# Patient Record
Sex: Female | Born: 1955 | Race: White | Hispanic: No | Marital: Married | Smoking: Current every day smoker
Health system: Southern US, Community
[De-identification: ages and names within clinical notes are randomized; demographics above are authoritative.]

## PROBLEM LIST (undated history)

## (undated) DIAGNOSIS — K2970 Gastritis, unspecified, without bleeding: Secondary | ICD-10-CM

## (undated) DIAGNOSIS — Z9049 Acquired absence of other specified parts of digestive tract: Secondary | ICD-10-CM

## (undated) DIAGNOSIS — F32A Depression, unspecified: Secondary | ICD-10-CM

## (undated) DIAGNOSIS — B9681 Helicobacter pylori [H. pylori] as the cause of diseases classified elsewhere: Secondary | ICD-10-CM

## (undated) DIAGNOSIS — K52831 Collagenous colitis: Secondary | ICD-10-CM

## (undated) DIAGNOSIS — Z85118 Personal history of other malignant neoplasm of bronchus and lung: Secondary | ICD-10-CM

## (undated) DIAGNOSIS — E039 Hypothyroidism, unspecified: Secondary | ICD-10-CM

## (undated) DIAGNOSIS — G8384 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postepileptic): Secondary | ICD-10-CM

## (undated) DIAGNOSIS — C801 Malignant (primary) neoplasm, unspecified: Secondary | ICD-10-CM

## (undated) DIAGNOSIS — J449 Chronic obstructive pulmonary disease, unspecified: Secondary | ICD-10-CM

## (undated) DIAGNOSIS — K219 Gastro-esophageal reflux disease without esophagitis: Secondary | ICD-10-CM

## (undated) DIAGNOSIS — I214 Non-ST elevation (NSTEMI) myocardial infarction: Secondary | ICD-10-CM

## (undated) DIAGNOSIS — M545 Low back pain, unspecified: Secondary | ICD-10-CM

## (undated) DIAGNOSIS — F329 Major depressive disorder, single episode, unspecified: Secondary | ICD-10-CM

## (undated) DIAGNOSIS — E785 Hyperlipidemia, unspecified: Secondary | ICD-10-CM

## (undated) DIAGNOSIS — M179 Osteoarthritis of knee, unspecified: Secondary | ICD-10-CM

## (undated) DIAGNOSIS — F419 Anxiety disorder, unspecified: Secondary | ICD-10-CM

## (undated) DIAGNOSIS — I352 Nonrheumatic aortic (valve) stenosis with insufficiency: Secondary | ICD-10-CM

## (undated) DIAGNOSIS — I639 Cerebral infarction, unspecified: Secondary | ICD-10-CM

## (undated) DIAGNOSIS — I2510 Atherosclerotic heart disease of native coronary artery without angina pectoris: Secondary | ICD-10-CM

## (undated) DIAGNOSIS — D126 Benign neoplasm of colon, unspecified: Secondary | ICD-10-CM

## (undated) DIAGNOSIS — K2210 Ulcer of esophagus without bleeding: Secondary | ICD-10-CM

## (undated) DIAGNOSIS — Z90710 Acquired absence of both cervix and uterus: Secondary | ICD-10-CM

## (undated) DIAGNOSIS — Z9861 Coronary angioplasty status: Secondary | ICD-10-CM

## (undated) DIAGNOSIS — R569 Unspecified convulsions: Secondary | ICD-10-CM

## (undated) DIAGNOSIS — M1710 Unilateral primary osteoarthritis, unspecified knee: Secondary | ICD-10-CM

## (undated) DIAGNOSIS — K5790 Diverticulosis of intestine, part unspecified, without perforation or abscess without bleeding: Secondary | ICD-10-CM

## (undated) DIAGNOSIS — K222 Esophageal obstruction: Secondary | ICD-10-CM

## (undated) HISTORY — DX: Non-ST elevation (NSTEMI) myocardial infarction: I21.4

## (undated) HISTORY — DX: Osteoarthritis of knee, unspecified: M17.9

## (undated) HISTORY — DX: Major depressive disorder, single episode, unspecified: F32.9

## (undated) HISTORY — DX: Anxiety disorder, unspecified: F41.9

## (undated) HISTORY — DX: Hypothyroidism, unspecified: E03.9

## (undated) HISTORY — DX: Benign neoplasm of colon, unspecified: D12.6

## (undated) HISTORY — PX: ESOPHAGOGASTRODUODENOSCOPY: SHX1529

## (undated) HISTORY — DX: Ulcer of esophagus without bleeding: K22.10

## (undated) HISTORY — DX: Low back pain, unspecified: M54.50

## (undated) HISTORY — DX: Chronic obstructive pulmonary disease, unspecified: J44.9

## (undated) HISTORY — DX: Unspecified convulsions: R56.9

## (undated) HISTORY — DX: Atherosclerotic heart disease of native coronary artery without angina pectoris: I25.10

## (undated) HISTORY — PX: TOTAL HIP ARTHROPLASTY: SHX124

## (undated) HISTORY — DX: Unilateral primary osteoarthritis, unspecified knee: M17.10

## (undated) HISTORY — DX: Collagenous colitis: K52.831

## (undated) HISTORY — DX: Diverticulosis of intestine, part unspecified, without perforation or abscess without bleeding: K57.90

## (undated) HISTORY — PX: ABDOMINAL SURGERY: SHX537

## (undated) HISTORY — PX: CHOLECYSTECTOMY: SHX55

## (undated) HISTORY — DX: Coronary angioplasty status: Z98.61

## (undated) HISTORY — DX: Gastro-esophageal reflux disease without esophagitis: K21.9

## (undated) HISTORY — DX: Gastritis, unspecified, without bleeding: K29.70

## (undated) HISTORY — DX: Low back pain: M54.5

## (undated) HISTORY — DX: Cerebral infarction, unspecified: I63.9

## (undated) HISTORY — DX: Nonrheumatic aortic (valve) stenosis with insufficiency: I35.2

## (undated) HISTORY — DX: Helicobacter pylori (H. pylori) as the cause of diseases classified elsewhere: B96.81

## (undated) HISTORY — PX: APPENDECTOMY: SHX54

## (undated) HISTORY — PX: COLONOSCOPY W/ BIOPSIES: SHX1374

## (undated) HISTORY — DX: Hyperlipidemia, unspecified: E78.5

## (undated) HISTORY — DX: Depression, unspecified: F32.A

## (undated) HISTORY — PX: ABDOMINAL HYSTERECTOMY: SHX81

---

## 1997-06-26 DIAGNOSIS — Z85118 Personal history of other malignant neoplasm of bronchus and lung: Secondary | ICD-10-CM

## 1997-06-26 HISTORY — DX: Personal history of other malignant neoplasm of bronchus and lung: Z85.118

## 1997-10-30 ENCOUNTER — Other Ambulatory Visit: Admission: RE | Admit: 1997-10-30 | Discharge: 1997-10-30 | Payer: Self-pay | Admitting: Hematology and Oncology

## 1997-11-12 ENCOUNTER — Ambulatory Visit (HOSPITAL_COMMUNITY): Admission: RE | Admit: 1997-11-12 | Discharge: 1997-11-12 | Payer: Self-pay | Admitting: Hematology and Oncology

## 1997-12-11 ENCOUNTER — Inpatient Hospital Stay (HOSPITAL_COMMUNITY): Admission: AD | Admit: 1997-12-11 | Discharge: 1997-12-16 | Payer: Self-pay | Admitting: Internal Medicine

## 1998-01-18 ENCOUNTER — Ambulatory Visit (HOSPITAL_COMMUNITY): Admission: RE | Admit: 1998-01-18 | Discharge: 1998-01-18 | Payer: Self-pay | Admitting: Hematology and Oncology

## 1998-01-20 ENCOUNTER — Other Ambulatory Visit: Admission: RE | Admit: 1998-01-20 | Discharge: 1998-01-20 | Payer: Self-pay | Admitting: Hematology and Oncology

## 1998-02-01 ENCOUNTER — Ambulatory Visit (HOSPITAL_COMMUNITY): Admission: RE | Admit: 1998-02-01 | Discharge: 1998-02-01 | Payer: Self-pay | Admitting: Internal Medicine

## 1998-02-02 ENCOUNTER — Inpatient Hospital Stay (HOSPITAL_COMMUNITY): Admission: EM | Admit: 1998-02-02 | Discharge: 1998-02-04 | Payer: Self-pay | Admitting: Internal Medicine

## 1998-04-01 ENCOUNTER — Emergency Department (HOSPITAL_COMMUNITY): Admission: EM | Admit: 1998-04-01 | Discharge: 1998-04-01 | Payer: Self-pay | Admitting: Emergency Medicine

## 1998-04-01 ENCOUNTER — Encounter: Payer: Self-pay | Admitting: Emergency Medicine

## 1998-04-05 ENCOUNTER — Ambulatory Visit (HOSPITAL_COMMUNITY): Admission: RE | Admit: 1998-04-05 | Discharge: 1998-04-05 | Payer: Self-pay | Admitting: Hematology and Oncology

## 1998-04-05 ENCOUNTER — Encounter: Payer: Self-pay | Admitting: Hematology and Oncology

## 1998-09-21 ENCOUNTER — Ambulatory Visit (HOSPITAL_COMMUNITY): Admission: RE | Admit: 1998-09-21 | Discharge: 1998-09-21 | Payer: Self-pay | Admitting: Internal Medicine

## 1998-09-21 ENCOUNTER — Encounter: Payer: Self-pay | Admitting: Internal Medicine

## 1998-10-18 ENCOUNTER — Ambulatory Visit (HOSPITAL_COMMUNITY): Admission: RE | Admit: 1998-10-18 | Discharge: 1998-10-18 | Payer: Self-pay | Admitting: Hematology and Oncology

## 1998-10-18 ENCOUNTER — Encounter: Payer: Self-pay | Admitting: Hematology and Oncology

## 1998-11-05 ENCOUNTER — Encounter: Admission: RE | Admit: 1998-11-05 | Discharge: 1999-01-24 | Payer: Self-pay | Admitting: *Deleted

## 1998-11-24 ENCOUNTER — Inpatient Hospital Stay (HOSPITAL_COMMUNITY): Admission: AD | Admit: 1998-11-24 | Discharge: 1998-12-08 | Payer: Self-pay | Admitting: Hematology and Oncology

## 1998-11-24 ENCOUNTER — Encounter: Payer: Self-pay | Admitting: Hematology and Oncology

## 1999-01-21 ENCOUNTER — Encounter: Payer: Self-pay | Admitting: Family Medicine

## 1999-01-21 ENCOUNTER — Ambulatory Visit (HOSPITAL_COMMUNITY): Admission: RE | Admit: 1999-01-21 | Discharge: 1999-01-21 | Payer: Self-pay | Admitting: Family Medicine

## 1999-01-25 ENCOUNTER — Encounter: Admission: RE | Admit: 1999-01-25 | Discharge: 1999-04-25 | Payer: Self-pay | Admitting: *Deleted

## 1999-01-26 ENCOUNTER — Ambulatory Visit (HOSPITAL_COMMUNITY): Admission: RE | Admit: 1999-01-26 | Discharge: 1999-01-26 | Payer: Self-pay | Admitting: Family Medicine

## 1999-01-26 ENCOUNTER — Encounter: Payer: Self-pay | Admitting: Family Medicine

## 1999-03-01 ENCOUNTER — Encounter (INDEPENDENT_AMBULATORY_CARE_PROVIDER_SITE_OTHER): Payer: Self-pay

## 1999-03-01 ENCOUNTER — Ambulatory Visit (HOSPITAL_COMMUNITY): Admission: RE | Admit: 1999-03-01 | Discharge: 1999-03-01 | Payer: Self-pay | Admitting: Gastroenterology

## 1999-04-04 ENCOUNTER — Encounter: Payer: Self-pay | Admitting: Hematology and Oncology

## 1999-04-04 ENCOUNTER — Ambulatory Visit (HOSPITAL_COMMUNITY): Admission: RE | Admit: 1999-04-04 | Discharge: 1999-04-04 | Payer: Self-pay | Admitting: Hematology and Oncology

## 1999-04-18 ENCOUNTER — Ambulatory Visit (HOSPITAL_COMMUNITY): Admission: RE | Admit: 1999-04-18 | Discharge: 1999-04-18 | Payer: Self-pay | Admitting: Hematology and Oncology

## 1999-04-18 ENCOUNTER — Encounter: Payer: Self-pay | Admitting: Hematology and Oncology

## 1999-07-08 ENCOUNTER — Inpatient Hospital Stay (HOSPITAL_COMMUNITY): Admission: AD | Admit: 1999-07-08 | Discharge: 1999-07-16 | Payer: Self-pay | Admitting: Hematology and Oncology

## 1999-07-09 ENCOUNTER — Encounter: Payer: Self-pay | Admitting: Hematology and Oncology

## 1999-07-11 ENCOUNTER — Encounter: Payer: Self-pay | Admitting: Hematology and Oncology

## 1999-07-11 ENCOUNTER — Encounter (INDEPENDENT_AMBULATORY_CARE_PROVIDER_SITE_OTHER): Payer: Self-pay | Admitting: *Deleted

## 1999-07-12 ENCOUNTER — Encounter: Payer: Self-pay | Admitting: Hematology and Oncology

## 1999-07-14 ENCOUNTER — Encounter: Payer: Self-pay | Admitting: Hematology and Oncology

## 1999-08-30 ENCOUNTER — Ambulatory Visit (HOSPITAL_COMMUNITY): Admission: RE | Admit: 1999-08-30 | Discharge: 1999-08-30 | Payer: Self-pay | Admitting: Hematology and Oncology

## 1999-10-28 ENCOUNTER — Inpatient Hospital Stay (HOSPITAL_COMMUNITY): Admission: AD | Admit: 1999-10-28 | Discharge: 1999-11-03 | Payer: Self-pay | Admitting: Hematology and Oncology

## 1999-10-28 ENCOUNTER — Encounter: Payer: Self-pay | Admitting: Hematology and Oncology

## 1999-10-29 ENCOUNTER — Encounter: Payer: Self-pay | Admitting: Hematology and Oncology

## 1999-10-31 ENCOUNTER — Encounter: Payer: Self-pay | Admitting: Hematology and Oncology

## 1999-12-02 ENCOUNTER — Encounter: Payer: Self-pay | Admitting: Hematology and Oncology

## 1999-12-02 ENCOUNTER — Inpatient Hospital Stay (HOSPITAL_COMMUNITY): Admission: EM | Admit: 1999-12-02 | Discharge: 1999-12-13 | Payer: Self-pay | Admitting: Hematology and Oncology

## 1999-12-07 ENCOUNTER — Encounter: Payer: Self-pay | Admitting: Hematology and Oncology

## 2000-02-29 ENCOUNTER — Encounter (INDEPENDENT_AMBULATORY_CARE_PROVIDER_SITE_OTHER): Payer: Self-pay | Admitting: *Deleted

## 2000-05-04 ENCOUNTER — Ambulatory Visit (HOSPITAL_COMMUNITY): Admission: RE | Admit: 2000-05-04 | Discharge: 2000-05-04 | Payer: Self-pay | Admitting: Hematology and Oncology

## 2000-05-04 ENCOUNTER — Encounter: Payer: Self-pay | Admitting: Hematology and Oncology

## 2000-05-11 ENCOUNTER — Encounter: Payer: Self-pay | Admitting: Hematology and Oncology

## 2000-05-11 ENCOUNTER — Ambulatory Visit (HOSPITAL_COMMUNITY): Admission: RE | Admit: 2000-05-11 | Discharge: 2000-05-11 | Payer: Self-pay | Admitting: Hematology and Oncology

## 2000-10-24 ENCOUNTER — Encounter: Payer: Self-pay | Admitting: Hematology and Oncology

## 2000-10-24 ENCOUNTER — Ambulatory Visit (HOSPITAL_COMMUNITY): Admission: RE | Admit: 2000-10-24 | Discharge: 2000-10-24 | Payer: Self-pay | Admitting: Hematology and Oncology

## 2001-05-10 ENCOUNTER — Encounter: Payer: Self-pay | Admitting: Hematology and Oncology

## 2001-05-10 ENCOUNTER — Ambulatory Visit (HOSPITAL_COMMUNITY): Admission: RE | Admit: 2001-05-10 | Discharge: 2001-05-10 | Payer: Self-pay | Admitting: Hematology and Oncology

## 2001-05-22 ENCOUNTER — Encounter: Payer: Self-pay | Admitting: Neurology

## 2001-05-22 ENCOUNTER — Encounter: Payer: Self-pay | Admitting: Emergency Medicine

## 2001-05-22 ENCOUNTER — Encounter (INDEPENDENT_AMBULATORY_CARE_PROVIDER_SITE_OTHER): Payer: Self-pay | Admitting: Specialist

## 2001-05-22 ENCOUNTER — Inpatient Hospital Stay (HOSPITAL_COMMUNITY): Admission: EM | Admit: 2001-05-22 | Discharge: 2001-06-02 | Payer: Self-pay | Admitting: Emergency Medicine

## 2001-05-24 ENCOUNTER — Encounter: Payer: Self-pay | Admitting: Critical Care Medicine

## 2001-05-25 ENCOUNTER — Encounter: Payer: Self-pay | Admitting: Pulmonary Disease

## 2001-05-27 ENCOUNTER — Encounter: Payer: Self-pay | Admitting: Neurology

## 2001-07-30 ENCOUNTER — Encounter: Payer: Self-pay | Admitting: Hematology and Oncology

## 2001-07-30 ENCOUNTER — Ambulatory Visit (HOSPITAL_COMMUNITY): Admission: RE | Admit: 2001-07-30 | Discharge: 2001-07-30 | Payer: Self-pay | Admitting: Hematology and Oncology

## 2001-11-26 ENCOUNTER — Encounter: Payer: Self-pay | Admitting: Hematology and Oncology

## 2001-11-26 ENCOUNTER — Ambulatory Visit (HOSPITAL_COMMUNITY): Admission: RE | Admit: 2001-11-26 | Discharge: 2001-11-26 | Payer: Self-pay | Admitting: Hematology and Oncology

## 2002-01-05 ENCOUNTER — Encounter: Payer: Self-pay | Admitting: Emergency Medicine

## 2002-01-05 ENCOUNTER — Inpatient Hospital Stay (HOSPITAL_COMMUNITY): Admission: EM | Admit: 2002-01-05 | Discharge: 2002-01-11 | Payer: Self-pay | Admitting: Emergency Medicine

## 2002-01-06 ENCOUNTER — Encounter: Payer: Self-pay | Admitting: Internal Medicine

## 2002-01-09 ENCOUNTER — Encounter: Payer: Self-pay | Admitting: Internal Medicine

## 2002-01-11 ENCOUNTER — Inpatient Hospital Stay: Admission: RE | Admit: 2002-01-11 | Discharge: 2002-01-17 | Payer: Self-pay | Admitting: *Deleted

## 2002-01-12 ENCOUNTER — Encounter (HOSPITAL_COMMUNITY): Admission: RE | Admit: 2002-01-12 | Discharge: 2002-01-17 | Payer: Self-pay | Admitting: *Deleted

## 2002-06-06 ENCOUNTER — Encounter: Payer: Self-pay | Admitting: *Deleted

## 2002-06-06 ENCOUNTER — Ambulatory Visit (HOSPITAL_COMMUNITY): Admission: RE | Admit: 2002-06-06 | Discharge: 2002-06-06 | Payer: Self-pay | Admitting: *Deleted

## 2003-09-03 ENCOUNTER — Encounter: Admission: RE | Admit: 2003-09-03 | Discharge: 2003-09-03 | Payer: Self-pay | Admitting: Internal Medicine

## 2003-09-04 ENCOUNTER — Encounter: Admission: RE | Admit: 2003-09-04 | Discharge: 2003-09-04 | Payer: Self-pay | Admitting: Internal Medicine

## 2003-09-17 ENCOUNTER — Encounter (INDEPENDENT_AMBULATORY_CARE_PROVIDER_SITE_OTHER): Payer: Self-pay | Admitting: *Deleted

## 2004-05-05 ENCOUNTER — Ambulatory Visit: Payer: Self-pay | Admitting: Hematology & Oncology

## 2004-06-08 ENCOUNTER — Ambulatory Visit: Payer: Self-pay | Admitting: Internal Medicine

## 2004-06-10 ENCOUNTER — Inpatient Hospital Stay (HOSPITAL_COMMUNITY): Admission: AD | Admit: 2004-06-10 | Discharge: 2004-06-14 | Payer: Self-pay | Admitting: Internal Medicine

## 2004-06-10 ENCOUNTER — Ambulatory Visit: Payer: Self-pay | Admitting: Internal Medicine

## 2004-07-26 ENCOUNTER — Ambulatory Visit: Payer: Self-pay | Admitting: Hematology & Oncology

## 2004-08-25 ENCOUNTER — Ambulatory Visit: Payer: Self-pay | Admitting: Internal Medicine

## 2004-08-25 ENCOUNTER — Ambulatory Visit: Payer: Self-pay

## 2004-09-01 ENCOUNTER — Ambulatory Visit: Payer: Self-pay | Admitting: Internal Medicine

## 2004-10-18 ENCOUNTER — Ambulatory Visit: Payer: Self-pay | Admitting: Hematology & Oncology

## 2004-12-22 ENCOUNTER — Ambulatory Visit: Payer: Self-pay | Admitting: Internal Medicine

## 2004-12-22 ENCOUNTER — Ambulatory Visit: Payer: Self-pay | Admitting: Hematology & Oncology

## 2004-12-31 ENCOUNTER — Encounter: Admission: RE | Admit: 2004-12-31 | Discharge: 2004-12-31 | Payer: Self-pay | Admitting: Internal Medicine

## 2005-02-02 ENCOUNTER — Ambulatory Visit (HOSPITAL_COMMUNITY): Admission: RE | Admit: 2005-02-02 | Discharge: 2005-02-02 | Payer: Self-pay | Admitting: Specialist

## 2005-03-02 ENCOUNTER — Ambulatory Visit: Payer: Self-pay | Admitting: Hematology & Oncology

## 2005-03-31 ENCOUNTER — Ambulatory Visit (HOSPITAL_BASED_OUTPATIENT_CLINIC_OR_DEPARTMENT_OTHER): Admission: RE | Admit: 2005-03-31 | Discharge: 2005-03-31 | Payer: Self-pay | Admitting: General Surgery

## 2005-04-18 ENCOUNTER — Emergency Department (HOSPITAL_COMMUNITY): Admission: EM | Admit: 2005-04-18 | Discharge: 2005-04-18 | Payer: Self-pay | Admitting: Emergency Medicine

## 2005-04-30 ENCOUNTER — Emergency Department (HOSPITAL_COMMUNITY): Admission: EM | Admit: 2005-04-30 | Discharge: 2005-04-30 | Payer: Self-pay | Admitting: Emergency Medicine

## 2005-05-04 ENCOUNTER — Ambulatory Visit: Payer: Self-pay | Admitting: Internal Medicine

## 2005-05-05 ENCOUNTER — Ambulatory Visit: Payer: Self-pay | Admitting: Internal Medicine

## 2005-06-30 ENCOUNTER — Ambulatory Visit: Payer: Self-pay | Admitting: Internal Medicine

## 2005-07-20 ENCOUNTER — Ambulatory Visit: Payer: Self-pay | Admitting: Internal Medicine

## 2005-07-27 ENCOUNTER — Ambulatory Visit: Payer: Self-pay | Admitting: Internal Medicine

## 2005-09-21 ENCOUNTER — Ambulatory Visit: Payer: Self-pay | Admitting: Internal Medicine

## 2005-10-25 ENCOUNTER — Encounter: Payer: Self-pay | Admitting: Hematology and Oncology

## 2005-12-20 ENCOUNTER — Ambulatory Visit: Payer: Self-pay | Admitting: Internal Medicine

## 2006-01-17 ENCOUNTER — Ambulatory Visit: Payer: Self-pay | Admitting: Internal Medicine

## 2006-02-14 ENCOUNTER — Ambulatory Visit: Payer: Self-pay | Admitting: Internal Medicine

## 2006-02-21 ENCOUNTER — Ambulatory Visit: Payer: Self-pay | Admitting: Internal Medicine

## 2006-02-22 ENCOUNTER — Inpatient Hospital Stay (HOSPITAL_COMMUNITY): Admission: EM | Admit: 2006-02-22 | Discharge: 2006-03-07 | Payer: Self-pay | Admitting: Emergency Medicine

## 2006-02-22 ENCOUNTER — Ambulatory Visit: Payer: Self-pay | Admitting: Internal Medicine

## 2006-02-24 ENCOUNTER — Encounter: Payer: Self-pay | Admitting: Internal Medicine

## 2006-03-05 ENCOUNTER — Ambulatory Visit: Payer: Self-pay | Admitting: Physical Medicine & Rehabilitation

## 2006-03-28 ENCOUNTER — Ambulatory Visit: Payer: Self-pay | Admitting: Internal Medicine

## 2006-03-30 ENCOUNTER — Ambulatory Visit: Payer: Self-pay | Admitting: Internal Medicine

## 2006-05-18 ENCOUNTER — Ambulatory Visit: Payer: Self-pay | Admitting: Internal Medicine

## 2006-05-28 ENCOUNTER — Ambulatory Visit: Payer: Self-pay | Admitting: Internal Medicine

## 2006-06-29 ENCOUNTER — Ambulatory Visit: Payer: Self-pay | Admitting: Licensed Clinical Social Worker

## 2006-07-13 ENCOUNTER — Ambulatory Visit: Payer: Self-pay | Admitting: Licensed Clinical Social Worker

## 2006-07-25 ENCOUNTER — Ambulatory Visit: Payer: Self-pay | Admitting: Internal Medicine

## 2006-07-25 LAB — CONVERTED CEMR LAB
ALT: 39 units/L (ref 0–40)
AST: 26 units/L (ref 0–37)
Albumin: 3.6 g/dL (ref 3.5–5.2)
Alkaline Phosphatase: 107 units/L (ref 39–117)
BUN: 10 mg/dL (ref 6–23)
Basophils Absolute: 0.1 10*3/uL (ref 0.0–0.1)
Basophils Relative: 0.8 % (ref 0.0–1.0)
Bilirubin, Direct: 0.1 mg/dL (ref 0.0–0.3)
CO2: 31 meq/L (ref 19–32)
Calcium: 9.6 mg/dL (ref 8.4–10.5)
Chloride: 105 meq/L (ref 96–112)
Cholesterol: 225 mg/dL (ref 0–200)
Cortisol, Plasma: 16.7 ug/dL
Creatinine, Ser: 1 mg/dL (ref 0.4–1.2)
Direct LDL: 125.2 mg/dL
Eosinophils Absolute: 0.1 10*3/uL (ref 0.0–0.6)
Eosinophils Relative: 1.9 % (ref 0.0–5.0)
GFR calc Af Amer: 75 mL/min
GFR calc non Af Amer: 62 mL/min
Glucose, Bld: 86 mg/dL (ref 70–99)
HCT: 37.4 % (ref 36.0–46.0)
HDL: 41.7 mg/dL (ref >39.0)
Hemoglobin: 13.1 g/dL (ref 12.0–15.0)
Lymphocytes Relative: 22.4 % (ref 12.0–46.0)
MCHC: 35.1 g/dL (ref 30.0–36.0)
MCV: 96.7 fL (ref 78.0–100.0)
Monocytes Absolute: 0.5 10*3/uL (ref 0.2–0.7)
Monocytes Relative: 6.5 % (ref 3.0–11.0)
Neutro Abs: 5.4 10*3/uL (ref 1.4–7.7)
Neutrophils Relative %: 68.4 % (ref 43.0–77.0)
Phenytoin Lvl: 17.1 ug/mL (ref 10.0–20.0)
Platelets: 240 10*3/uL (ref 150–400)
Potassium: 4.2 meq/L (ref 3.5–5.1)
RBC: 3.87 M/uL (ref 3.87–5.11)
RDW: 13.4 % (ref 11.5–14.6)
Sodium: 141 meq/L (ref 135–145)
TSH: 2.52 microintl units/mL (ref 0.35–5.50)
Total Bilirubin: 0.3 mg/dL (ref 0.3–1.2)
Total CHOL/HDL Ratio: 5.4
Total Protein: 6.4 g/dL (ref 6.0–8.3)
Triglycerides: 235 mg/dL (ref 0–149)
VLDL: 47 mg/dL — ABNORMAL HIGH (ref 0–40)
Vit D, 1,25-Dihydroxy: 10 — ABNORMAL LOW (ref 20–57)
WBC: 7.8 10*3/uL (ref 4.5–10.5)

## 2006-08-24 ENCOUNTER — Ambulatory Visit: Payer: Self-pay | Admitting: Internal Medicine

## 2006-09-05 ENCOUNTER — Ambulatory Visit: Payer: Self-pay | Admitting: Licensed Clinical Social Worker

## 2006-09-28 ENCOUNTER — Ambulatory Visit: Payer: Self-pay | Admitting: Licensed Clinical Social Worker

## 2006-10-24 ENCOUNTER — Ambulatory Visit: Payer: Self-pay | Admitting: Internal Medicine

## 2006-10-24 LAB — CONVERTED CEMR LAB
ALT: 32 units/L (ref 0–40)
AST: 21 units/L (ref 0–37)
Albumin: 3.5 g/dL (ref 3.5–5.2)
Alkaline Phosphatase: 87 units/L (ref 39–117)
BUN: 10 mg/dL (ref 6–23)
Basophils Absolute: 0 10*3/uL (ref 0.0–0.1)
Basophils Relative: 0.1 % (ref 0.0–1.0)
Bilirubin, Direct: 0.1 mg/dL (ref 0.0–0.3)
CO2: 34 meq/L — ABNORMAL HIGH (ref 19–32)
Calcium: 9.3 mg/dL (ref 8.4–10.5)
Chloride: 106 meq/L (ref 96–112)
Creatinine, Ser: 0.9 mg/dL (ref 0.4–1.2)
Eosinophils Absolute: 0.1 10*3/uL (ref 0.0–0.6)
Eosinophils Relative: 1 % (ref 0.0–5.0)
GFR calc Af Amer: 85 mL/min
GFR calc non Af Amer: 70 mL/min
Glucose, Bld: 73 mg/dL (ref 70–99)
HCT: 36 % (ref 36.0–46.0)
Hemoglobin: 12.5 g/dL (ref 12.0–15.0)
Lymphocytes Relative: 13 % (ref 12.0–46.0)
MCHC: 34.8 g/dL (ref 30.0–36.0)
MCV: 99.1 fL (ref 78.0–100.0)
Monocytes Absolute: 0.5 10*3/uL (ref 0.2–0.7)
Monocytes Relative: 4.9 % (ref 3.0–11.0)
Neutro Abs: 8.4 10*3/uL — ABNORMAL HIGH (ref 1.4–7.7)
Neutrophils Relative %: 81 % — ABNORMAL HIGH (ref 43.0–77.0)
Phenytoin Lvl: 19.7 ug/mL (ref 10.0–20.0)
Platelets: 234 10*3/uL (ref 150–400)
Potassium: 4.3 meq/L (ref 3.5–5.1)
RBC: 3.63 M/uL — ABNORMAL LOW (ref 3.87–5.11)
RDW: 12.2 % (ref 11.5–14.6)
Sodium: 143 meq/L (ref 135–145)
TSH: 1.74 microintl units/mL (ref 0.35–5.50)
Total Bilirubin: 0.5 mg/dL (ref 0.3–1.2)
Total Protein: 6.2 g/dL (ref 6.0–8.3)
Vit D, 1,25-Dihydroxy: 11 — ABNORMAL LOW (ref 20–57)
Vitamin B-12: 392 pg/mL (ref 211–911)
WBC: 10.4 10*3/uL (ref 4.5–10.5)

## 2006-11-30 ENCOUNTER — Ambulatory Visit: Payer: Self-pay | Admitting: Internal Medicine

## 2006-11-30 LAB — CONVERTED CEMR LAB
ALT: 28 units/L (ref 0–40)
AST: 20 units/L (ref 0–37)
Albumin: 3.4 g/dL — ABNORMAL LOW (ref 3.5–5.2)
Alkaline Phosphatase: 65 units/L (ref 39–117)
BUN: 9 mg/dL (ref 6–23)
Basophils Absolute: 0 10*3/uL (ref 0.0–0.1)
Basophils Relative: 0 % (ref 0.0–1.0)
Bilirubin, Direct: 0.1 mg/dL (ref 0.0–0.3)
CO2: 33 meq/L — ABNORMAL HIGH (ref 19–32)
Calcium: 9.3 mg/dL (ref 8.4–10.5)
Chloride: 102 meq/L (ref 96–112)
Cortisol, Plasma: 22.3 ug/dL
Creatinine, Ser: 1 mg/dL (ref 0.4–1.2)
Eosinophils Absolute: 0.1 10*3/uL (ref 0.0–0.6)
Eosinophils Relative: 1.9 % (ref 0.0–5.0)
GFR calc Af Amer: 75 mL/min
GFR calc non Af Amer: 62 mL/min
Glucose, Bld: 75 mg/dL (ref 70–99)
HCT: 37.3 % (ref 36.0–46.0)
Hemoglobin: 12.8 g/dL (ref 12.0–15.0)
Lymphocytes Relative: 19.6 % (ref 12.0–46.0)
MCHC: 34.3 g/dL (ref 30.0–36.0)
MCV: 98.9 fL (ref 78.0–100.0)
Monocytes Absolute: 0.5 10*3/uL (ref 0.2–0.7)
Monocytes Relative: 6.3 % (ref 3.0–11.0)
Neutro Abs: 5.6 10*3/uL (ref 1.4–7.7)
Neutrophils Relative %: 72.2 % (ref 43.0–77.0)
Phenytoin Lvl: 23.6 ug/mL — ABNORMAL HIGH (ref 10.0–20.0)
Platelets: 212 10*3/uL (ref 150–400)
Potassium: 3.9 meq/L (ref 3.5–5.1)
RBC: 3.77 M/uL — ABNORMAL LOW (ref 3.87–5.11)
RDW: 12.4 % (ref 11.5–14.6)
Sodium: 139 meq/L (ref 135–145)
Total Bilirubin: 0.5 mg/dL (ref 0.3–1.2)
Total Protein: 6 g/dL (ref 6.0–8.3)
WBC: 7.7 10*3/uL (ref 4.5–10.5)

## 2006-12-04 ENCOUNTER — Emergency Department (HOSPITAL_COMMUNITY): Admission: EM | Admit: 2006-12-04 | Discharge: 2006-12-04 | Payer: Self-pay | Admitting: Emergency Medicine

## 2006-12-19 ENCOUNTER — Ambulatory Visit: Payer: Self-pay | Admitting: Internal Medicine

## 2007-01-17 DIAGNOSIS — M545 Low back pain, unspecified: Secondary | ICD-10-CM | POA: Insufficient documentation

## 2007-01-17 DIAGNOSIS — Z8529 Personal history of malignant neoplasm of other respiratory and intrathoracic organs: Secondary | ICD-10-CM | POA: Insufficient documentation

## 2007-01-17 DIAGNOSIS — Z8639 Personal history of other endocrine, nutritional and metabolic disease: Secondary | ICD-10-CM | POA: Insufficient documentation

## 2007-01-17 DIAGNOSIS — G40909 Epilepsy, unspecified, not intractable, without status epilepticus: Secondary | ICD-10-CM | POA: Insufficient documentation

## 2007-01-17 DIAGNOSIS — E039 Hypothyroidism, unspecified: Secondary | ICD-10-CM | POA: Insufficient documentation

## 2007-01-17 DIAGNOSIS — Z85118 Personal history of other malignant neoplasm of bronchus and lung: Secondary | ICD-10-CM

## 2007-01-17 DIAGNOSIS — E559 Vitamin D deficiency, unspecified: Secondary | ICD-10-CM | POA: Insufficient documentation

## 2007-03-15 ENCOUNTER — Ambulatory Visit: Payer: Self-pay | Admitting: Internal Medicine

## 2007-03-15 ENCOUNTER — Telehealth: Payer: Self-pay | Admitting: Internal Medicine

## 2007-03-15 LAB — CONVERTED CEMR LAB
BUN: 14 mg/dL (ref 6–23)
CO2: 31 meq/L (ref 19–32)
Calcium: 9.3 mg/dL (ref 8.4–10.5)
Chloride: 107 meq/L (ref 96–112)
Creatinine, Ser: 0.9 mg/dL (ref 0.4–1.2)
Crystals: NEGATIVE
GFR calc Af Amer: 84 mL/min
GFR calc non Af Amer: 70 mL/min
Glucose, Bld: 81 mg/dL (ref 70–99)
Mucus, UA: NEGATIVE
Nitrite: NEGATIVE
Phenytoin Lvl: 6.7 ug/mL — ABNORMAL LOW (ref 10.0–20.0)
Potassium: 3.7 meq/L (ref 3.5–5.1)
Sodium: 143 meq/L (ref 135–145)
Specific Gravity, Urine: >=1.03 (ref 1.000–1.03)
Total Protein, Urine: 100 mg/dL — AB
Urine Glucose: NEGATIVE mg/dL
Urobilinogen, UA: 1 (ref 0.0–1.0)
pH: 6 (ref 5.0–8.0)

## 2007-03-27 ENCOUNTER — Ambulatory Visit: Payer: Self-pay | Admitting: Internal Medicine

## 2007-03-27 DIAGNOSIS — E2749 Other adrenocortical insufficiency: Secondary | ICD-10-CM | POA: Insufficient documentation

## 2007-03-27 DIAGNOSIS — K5289 Other specified noninfective gastroenteritis and colitis: Secondary | ICD-10-CM | POA: Insufficient documentation

## 2007-03-27 DIAGNOSIS — C3490 Malignant neoplasm of unspecified part of unspecified bronchus or lung: Secondary | ICD-10-CM | POA: Insufficient documentation

## 2007-03-27 DIAGNOSIS — N12 Tubulo-interstitial nephritis, not specified as acute or chronic: Secondary | ICD-10-CM | POA: Insufficient documentation

## 2007-04-01 ENCOUNTER — Encounter: Payer: Self-pay | Admitting: Internal Medicine

## 2007-04-01 DIAGNOSIS — J449 Chronic obstructive pulmonary disease, unspecified: Secondary | ICD-10-CM | POA: Insufficient documentation

## 2007-04-01 DIAGNOSIS — M1990 Unspecified osteoarthritis, unspecified site: Secondary | ICD-10-CM | POA: Insufficient documentation

## 2007-04-05 ENCOUNTER — Encounter: Admission: RE | Admit: 2007-04-05 | Discharge: 2007-04-05 | Payer: Self-pay | Admitting: Internal Medicine

## 2007-04-12 ENCOUNTER — Telehealth: Payer: Self-pay | Admitting: Internal Medicine

## 2007-05-08 ENCOUNTER — Ambulatory Visit: Payer: Self-pay | Admitting: Internal Medicine

## 2007-05-20 ENCOUNTER — Telehealth: Payer: Self-pay | Admitting: Internal Medicine

## 2007-05-28 ENCOUNTER — Telehealth: Payer: Self-pay | Admitting: Internal Medicine

## 2007-06-10 ENCOUNTER — Telehealth (INDEPENDENT_AMBULATORY_CARE_PROVIDER_SITE_OTHER): Payer: Self-pay | Admitting: *Deleted

## 2007-07-05 ENCOUNTER — Ambulatory Visit: Payer: Self-pay | Admitting: Internal Medicine

## 2007-07-05 DIAGNOSIS — I10 Essential (primary) hypertension: Secondary | ICD-10-CM | POA: Insufficient documentation

## 2007-07-05 DIAGNOSIS — N3000 Acute cystitis without hematuria: Secondary | ICD-10-CM | POA: Insufficient documentation

## 2007-07-05 DIAGNOSIS — R109 Unspecified abdominal pain: Secondary | ICD-10-CM | POA: Insufficient documentation

## 2007-07-05 LAB — CONVERTED CEMR LAB
ALT: 11 units/L (ref 0–35)
AST: 13 units/L (ref 0–37)
Albumin: 3.3 g/dL — ABNORMAL LOW (ref 3.5–5.2)
Alkaline Phosphatase: 82 units/L (ref 39–117)
BUN: 8 mg/dL (ref 6–23)
Basophils Absolute: 0.1 10*3/uL (ref 0.0–0.1)
Basophils Relative: 1.2 % — ABNORMAL HIGH (ref 0.0–1.0)
Bilirubin Urine: NEGATIVE
Bilirubin, Direct: 0.1 mg/dL (ref 0.0–0.3)
CO2: 31 meq/L (ref 19–32)
Calcium: 9.3 mg/dL (ref 8.4–10.5)
Chloride: 101 meq/L (ref 96–112)
Creatinine, Ser: 0.9 mg/dL (ref 0.4–1.2)
Eosinophils Absolute: 0.2 10*3/uL (ref 0.0–0.6)
Eosinophils Relative: 2.5 % (ref 0.0–5.0)
GFR calc Af Amer: 84 mL/min
GFR calc non Af Amer: 70 mL/min
Glucose, Bld: 91 mg/dL (ref 70–99)
HCT: 32.3 % — ABNORMAL LOW (ref 36.0–46.0)
Hemoglobin, Urine: NEGATIVE
Hemoglobin: 11.2 g/dL — ABNORMAL LOW (ref 12.0–15.0)
Ketones, ur: NEGATIVE mg/dL
Leukocytes, UA: NEGATIVE
Lymphocytes Relative: 27.7 % (ref 12.0–46.0)
MCHC: 34.9 g/dL (ref 30.0–36.0)
MCV: 97.3 fL (ref 78.0–100.0)
Monocytes Absolute: 0.5 10*3/uL (ref 0.2–0.7)
Monocytes Relative: 7 % (ref 3.0–11.0)
Neutro Abs: 4.4 10*3/uL (ref 1.4–7.7)
Neutrophils Relative %: 61.6 % (ref 43.0–77.0)
Nitrite: NEGATIVE
Platelets: 191 10*3/uL (ref 150–400)
Potassium: 4 meq/L (ref 3.5–5.1)
RBC: 3.32 M/uL — ABNORMAL LOW (ref 3.87–5.11)
RDW: 12.8 % (ref 11.5–14.6)
Sodium: 138 meq/L (ref 135–145)
Specific Gravity, Urine: 1.02 (ref 1.000–1.03)
TSH: 1.03 microintl units/mL (ref 0.35–5.50)
Total Bilirubin: 0.5 mg/dL (ref 0.3–1.2)
Total Protein, Urine: NEGATIVE mg/dL
Total Protein: 6.2 g/dL (ref 6.0–8.3)
Urine Glucose: NEGATIVE mg/dL
Urobilinogen, UA: 0.2 (ref 0.0–1.0)
WBC: 7.2 10*3/uL (ref 4.5–10.5)
pH: 6.5 (ref 5.0–8.0)

## 2007-07-12 ENCOUNTER — Ambulatory Visit: Payer: Self-pay | Admitting: Internal Medicine

## 2007-07-12 DIAGNOSIS — J209 Acute bronchitis, unspecified: Secondary | ICD-10-CM | POA: Insufficient documentation

## 2007-07-15 ENCOUNTER — Telehealth: Payer: Self-pay | Admitting: Internal Medicine

## 2007-08-08 ENCOUNTER — Encounter: Payer: Self-pay | Admitting: Internal Medicine

## 2008-05-10 ENCOUNTER — Emergency Department (HOSPITAL_COMMUNITY): Admission: EM | Admit: 2008-05-10 | Discharge: 2008-05-10 | Payer: Self-pay | Admitting: Emergency Medicine

## 2008-05-13 ENCOUNTER — Ambulatory Visit: Payer: Self-pay | Admitting: Internal Medicine

## 2008-05-14 ENCOUNTER — Encounter: Payer: Self-pay | Admitting: Internal Medicine

## 2008-05-14 ENCOUNTER — Ambulatory Visit: Payer: Self-pay | Admitting: Cardiology

## 2008-05-15 LAB — CONVERTED CEMR LAB
Bilirubin Urine: NEGATIVE
Hemoglobin, Urine: NEGATIVE
Ketones, ur: NEGATIVE mg/dL
Mucus, UA: NEGATIVE
Nitrite: NEGATIVE
Specific Gravity, Urine: >=1.03 (ref 1.000–1.03)
Total Protein, Urine: NEGATIVE mg/dL
Urine Glucose: NEGATIVE mg/dL
Urobilinogen, UA: 0.2 (ref 0.0–1.0)
pH: 5.5 (ref 5.0–8.0)

## 2008-05-28 ENCOUNTER — Ambulatory Visit: Payer: Self-pay | Admitting: Internal Medicine

## 2008-05-28 DIAGNOSIS — K5900 Constipation, unspecified: Secondary | ICD-10-CM | POA: Insufficient documentation

## 2008-05-29 DIAGNOSIS — F321 Major depressive disorder, single episode, moderate: Secondary | ICD-10-CM | POA: Insufficient documentation

## 2008-06-26 DIAGNOSIS — B9681 Helicobacter pylori [H. pylori] as the cause of diseases classified elsewhere: Secondary | ICD-10-CM

## 2008-06-26 DIAGNOSIS — K2970 Gastritis, unspecified, without bleeding: Secondary | ICD-10-CM

## 2008-06-26 HISTORY — DX: Helicobacter pylori (H. pylori) as the cause of diseases classified elsewhere: B96.81

## 2008-06-26 HISTORY — DX: Helicobacter pylori (H. pylori) as the cause of diseases classified elsewhere: K29.70

## 2008-07-02 ENCOUNTER — Ambulatory Visit: Payer: Self-pay | Admitting: Internal Medicine

## 2008-07-02 ENCOUNTER — Telehealth: Payer: Self-pay | Admitting: Internal Medicine

## 2008-07-02 DIAGNOSIS — R269 Unspecified abnormalities of gait and mobility: Secondary | ICD-10-CM | POA: Insufficient documentation

## 2008-07-02 DIAGNOSIS — Z8679 Personal history of other diseases of the circulatory system: Secondary | ICD-10-CM | POA: Insufficient documentation

## 2008-07-06 ENCOUNTER — Telehealth: Payer: Self-pay | Admitting: Internal Medicine

## 2008-07-07 ENCOUNTER — Encounter: Payer: Self-pay | Admitting: Internal Medicine

## 2008-07-10 ENCOUNTER — Encounter: Payer: Self-pay | Admitting: Internal Medicine

## 2008-07-13 ENCOUNTER — Telehealth: Payer: Self-pay | Admitting: Internal Medicine

## 2008-07-14 ENCOUNTER — Encounter: Payer: Self-pay | Admitting: Internal Medicine

## 2008-07-23 ENCOUNTER — Encounter: Payer: Self-pay | Admitting: Internal Medicine

## 2008-07-30 ENCOUNTER — Ambulatory Visit: Payer: Self-pay | Admitting: Internal Medicine

## 2008-07-30 DIAGNOSIS — G4700 Insomnia, unspecified: Secondary | ICD-10-CM | POA: Insufficient documentation

## 2008-07-30 LAB — CONVERTED CEMR LAB
Bilirubin Urine: NEGATIVE
Hemoglobin, Urine: NEGATIVE
Ketones, ur: NEGATIVE mg/dL
Leukocytes, UA: NEGATIVE
Nitrite: NEGATIVE
Specific Gravity, Urine: 1.02 (ref 1.000–1.03)
Total Protein, Urine: NEGATIVE mg/dL
Urine Glucose: NEGATIVE mg/dL
Urobilinogen, UA: 0.2 (ref 0.0–1.0)
pH: 6.5 (ref 5.0–8.0)

## 2008-08-13 ENCOUNTER — Encounter: Payer: Self-pay | Admitting: Internal Medicine

## 2008-09-16 ENCOUNTER — Inpatient Hospital Stay (HOSPITAL_COMMUNITY): Admission: EM | Admit: 2008-09-16 | Discharge: 2008-09-20 | Payer: Self-pay | Admitting: Emergency Medicine

## 2008-09-16 ENCOUNTER — Ambulatory Visit: Payer: Self-pay | Admitting: Internal Medicine

## 2008-09-16 ENCOUNTER — Encounter: Payer: Self-pay | Admitting: Internal Medicine

## 2008-09-16 ENCOUNTER — Ambulatory Visit: Payer: Self-pay | Admitting: Cardiology

## 2008-09-16 DIAGNOSIS — I69920 Aphasia following unspecified cerebrovascular disease: Secondary | ICD-10-CM | POA: Insufficient documentation

## 2008-09-17 ENCOUNTER — Encounter (INDEPENDENT_AMBULATORY_CARE_PROVIDER_SITE_OTHER): Payer: Self-pay | Admitting: Neurology

## 2008-09-18 ENCOUNTER — Encounter (INDEPENDENT_AMBULATORY_CARE_PROVIDER_SITE_OTHER): Payer: Self-pay | Admitting: Neurology

## 2008-09-24 ENCOUNTER — Ambulatory Visit: Payer: Self-pay | Admitting: Internal Medicine

## 2008-09-27 LAB — CONVERTED CEMR LAB
ALT: 19 units/L (ref 0–35)
AST: 15 units/L (ref 0–37)
Albumin: 3.3 g/dL — ABNORMAL LOW (ref 3.5–5.2)
Alkaline Phosphatase: 90 units/L (ref 39–117)
BUN: 8 mg/dL (ref 6–23)
Bilirubin Urine: NEGATIVE
Bilirubin, Direct: 0.1 mg/dL (ref 0.0–0.3)
CO2: 32 meq/L (ref 19–32)
Calcium: 9.2 mg/dL (ref 8.4–10.5)
Chloride: 104 meq/L (ref 96–112)
Cholesterol: 319 mg/dL — ABNORMAL HIGH (ref 0–200)
Creatinine, Ser: 0.9 mg/dL (ref 0.4–1.2)
Direct LDL: 232.8 mg/dL
GFR calc non Af Amer: 69.15 mL/min (ref >60)
Glucose, Bld: 123 mg/dL — ABNORMAL HIGH (ref 70–99)
HDL: 40 mg/dL (ref >39.00)
Hemoglobin, Urine: NEGATIVE
Ketones, ur: NEGATIVE mg/dL
Nitrite: NEGATIVE
Phenytoin Lvl: 6.8 ug/mL — ABNORMAL LOW (ref 10.0–20.0)
Potassium: 3.4 meq/L — ABNORMAL LOW (ref 3.5–5.1)
Sodium: 142 meq/L (ref 135–145)
Specific Gravity, Urine: 1.02 (ref 1.000–1.030)
TSH: 1.08 microintl units/mL (ref 0.35–5.50)
Total Bilirubin: 0.4 mg/dL (ref 0.3–1.2)
Total CHOL/HDL Ratio: 8
Total Protein, Urine: NEGATIVE mg/dL
Total Protein: 6.1 g/dL (ref 6.0–8.3)
Triglycerides: 215 mg/dL — ABNORMAL HIGH (ref 0.0–149.0)
Urine Glucose: NEGATIVE mg/dL
Urobilinogen, UA: 0.2 (ref 0.0–1.0)
VLDL: 43 mg/dL — ABNORMAL HIGH (ref 0.0–40.0)
pH: 6 (ref 5.0–8.0)

## 2008-10-01 ENCOUNTER — Ambulatory Visit: Payer: Self-pay | Admitting: Internal Medicine

## 2008-10-01 DIAGNOSIS — I959 Hypotension, unspecified: Secondary | ICD-10-CM | POA: Insufficient documentation

## 2008-10-01 DIAGNOSIS — E785 Hyperlipidemia, unspecified: Secondary | ICD-10-CM | POA: Insufficient documentation

## 2008-10-23 ENCOUNTER — Encounter: Payer: Self-pay | Admitting: Internal Medicine

## 2008-12-03 ENCOUNTER — Ambulatory Visit: Payer: Self-pay | Admitting: Internal Medicine

## 2008-12-03 LAB — CONVERTED CEMR LAB
BUN: 12 mg/dL (ref 6–23)
CO2: 32 meq/L (ref 19–32)
Calcium: 9.6 mg/dL (ref 8.4–10.5)
Chloride: 107 meq/L (ref 96–112)
Creatinine, Ser: 0.9 mg/dL (ref 0.4–1.2)
GFR calc non Af Amer: 69.1 mL/min (ref >60)
Glucose, Bld: 83 mg/dL (ref 70–99)
Potassium: 3.9 meq/L (ref 3.5–5.1)
Sodium: 144 meq/L (ref 135–145)

## 2008-12-10 ENCOUNTER — Ambulatory Visit: Payer: Self-pay | Admitting: Internal Medicine

## 2008-12-10 DIAGNOSIS — R5382 Chronic fatigue, unspecified: Secondary | ICD-10-CM | POA: Insufficient documentation

## 2009-02-04 ENCOUNTER — Ambulatory Visit: Payer: Self-pay | Admitting: Internal Medicine

## 2009-02-04 ENCOUNTER — Telehealth: Payer: Self-pay | Admitting: Internal Medicine

## 2009-02-05 LAB — CONVERTED CEMR LAB
ALT: 12 units/L (ref 0–35)
AST: 13 units/L (ref 0–37)
BUN: 10 mg/dL (ref 6–23)
Bilirubin Urine: NEGATIVE
CO2: 31 meq/L (ref 19–32)
Calcium: 9.3 mg/dL (ref 8.4–10.5)
Chloride: 104 meq/L (ref 96–112)
Creatinine, Ser: 0.9 mg/dL (ref 0.4–1.2)
GFR calc non Af Amer: 69.06 mL/min (ref >60)
Glucose, Bld: 93 mg/dL (ref 70–99)
Hemoglobin, Urine: NEGATIVE
Ketones, ur: NEGATIVE mg/dL
Nitrite: NEGATIVE
Phenytoin Lvl: 5.8 ug/mL — ABNORMAL LOW (ref 10.0–20.0)
Potassium: 4 meq/L (ref 3.5–5.1)
Sodium: 141 meq/L (ref 135–145)
Specific Gravity, Urine: 1.015 (ref 1.000–1.030)
Total Protein, Urine: NEGATIVE mg/dL
Urine Glucose: NEGATIVE mg/dL
Urobilinogen, UA: 0.2 (ref 0.0–1.0)
pH: 6.5 (ref 5.0–8.0)

## 2009-02-11 ENCOUNTER — Ambulatory Visit: Payer: Self-pay | Admitting: Internal Medicine

## 2009-04-08 ENCOUNTER — Ambulatory Visit: Payer: Self-pay | Admitting: Internal Medicine

## 2009-04-08 DIAGNOSIS — M79609 Pain in unspecified limb: Secondary | ICD-10-CM | POA: Insufficient documentation

## 2009-04-08 DIAGNOSIS — F17210 Nicotine dependence, cigarettes, uncomplicated: Secondary | ICD-10-CM | POA: Insufficient documentation

## 2009-04-13 LAB — CONVERTED CEMR LAB
ALT: 21 units/L (ref 0–35)
AST: 15 units/L (ref 0–37)
Albumin: 3.6 g/dL (ref 3.5–5.2)
Alkaline Phosphatase: 105 units/L (ref 39–117)
BUN: 11 mg/dL (ref 6–23)
Bilirubin Urine: NEGATIVE
Bilirubin, Direct: 0.1 mg/dL (ref 0.0–0.3)
CO2: 31 meq/L (ref 19–32)
Calcium: 10 mg/dL (ref 8.4–10.5)
Chloride: 103 meq/L (ref 96–112)
Creatinine, Ser: 1 mg/dL (ref 0.4–1.2)
GFR calc non Af Amer: 61.11 mL/min (ref >60)
Glucose, Bld: 90 mg/dL (ref 70–99)
Hemoglobin, Urine: NEGATIVE
Ketones, ur: NEGATIVE mg/dL
Leukocytes, UA: NEGATIVE
Nitrite: NEGATIVE
Potassium: 4.7 meq/L (ref 3.5–5.1)
Sodium: 141 meq/L (ref 135–145)
Specific Gravity, Urine: 1.025 (ref 1.000–1.030)
Total Bilirubin: 0.4 mg/dL (ref 0.3–1.2)
Total Protein, Urine: NEGATIVE mg/dL
Total Protein: 6.6 g/dL (ref 6.0–8.3)
Urine Glucose: NEGATIVE mg/dL
Urobilinogen, UA: 0.2 (ref 0.0–1.0)
Vitamin B-12: 469 pg/mL (ref 211–911)
pH: 5.5 (ref 5.0–8.0)

## 2009-05-05 ENCOUNTER — Encounter: Admission: RE | Admit: 2009-05-05 | Discharge: 2009-05-05 | Payer: Self-pay | Admitting: Orthopedic Surgery

## 2009-05-11 ENCOUNTER — Observation Stay (HOSPITAL_COMMUNITY): Admission: EM | Admit: 2009-05-11 | Discharge: 2009-05-13 | Payer: Self-pay | Admitting: Emergency Medicine

## 2009-05-12 ENCOUNTER — Other Ambulatory Visit: Payer: Self-pay | Admitting: Internal Medicine

## 2009-05-12 ENCOUNTER — Ambulatory Visit: Payer: Self-pay | Admitting: Internal Medicine

## 2009-05-13 ENCOUNTER — Other Ambulatory Visit: Payer: Self-pay | Admitting: Internal Medicine

## 2009-06-07 ENCOUNTER — Encounter: Admission: RE | Admit: 2009-06-07 | Discharge: 2009-06-07 | Payer: Self-pay | Admitting: Orthopedic Surgery

## 2009-06-11 ENCOUNTER — Ambulatory Visit: Payer: Self-pay | Admitting: Internal Medicine

## 2009-06-11 ENCOUNTER — Telehealth: Payer: Self-pay | Admitting: Internal Medicine

## 2009-06-11 ENCOUNTER — Encounter (INDEPENDENT_AMBULATORY_CARE_PROVIDER_SITE_OTHER): Payer: Self-pay | Admitting: *Deleted

## 2009-06-11 DIAGNOSIS — K219 Gastro-esophageal reflux disease without esophagitis: Secondary | ICD-10-CM | POA: Insufficient documentation

## 2009-06-11 DIAGNOSIS — R1013 Epigastric pain: Secondary | ICD-10-CM | POA: Insufficient documentation

## 2009-06-24 ENCOUNTER — Ambulatory Visit: Payer: Self-pay | Admitting: Internal Medicine

## 2009-06-26 DIAGNOSIS — D126 Benign neoplasm of colon, unspecified: Secondary | ICD-10-CM

## 2009-06-26 HISTORY — DX: Benign neoplasm of colon, unspecified: D12.6

## 2009-07-08 ENCOUNTER — Ambulatory Visit: Payer: Self-pay | Admitting: Internal Medicine

## 2009-08-09 ENCOUNTER — Telehealth: Payer: Self-pay | Admitting: Internal Medicine

## 2009-08-12 ENCOUNTER — Ambulatory Visit: Payer: Self-pay | Admitting: Internal Medicine

## 2009-08-13 LAB — CONVERTED CEMR LAB
ALT: 22 units/L (ref 0–35)
AST: 18 units/L (ref 0–37)
Albumin: 3.4 g/dL — ABNORMAL LOW (ref 3.5–5.2)
Alkaline Phosphatase: 114 units/L (ref 39–117)
BUN: 10 mg/dL (ref 6–23)
Basophils Absolute: 0 10*3/uL (ref 0.0–0.1)
Basophils Relative: 0.8 % (ref 0.0–3.0)
Bilirubin, Direct: 0.1 mg/dL (ref 0.0–0.3)
CO2: 30 meq/L (ref 19–32)
Calcium: 9.1 mg/dL (ref 8.4–10.5)
Chloride: 109 meq/L (ref 96–112)
Cholesterol: 339 mg/dL — ABNORMAL HIGH (ref 0–200)
Creatinine, Ser: 1.1 mg/dL (ref 0.4–1.2)
Direct LDL: 232.7 mg/dL
Eosinophils Absolute: 0.4 10*3/uL (ref 0.0–0.7)
Eosinophils Relative: 6 % — ABNORMAL HIGH (ref 0.0–5.0)
GFR calc non Af Amer: 54.68 mL/min (ref >60)
Glucose, Bld: 99 mg/dL (ref 70–99)
HCT: 35.2 % — ABNORMAL LOW (ref 36.0–46.0)
HDL: 43.1 mg/dL (ref >39.00)
Hemoglobin: 12 g/dL (ref 12.0–15.0)
Lymphocytes Relative: 22.3 % (ref 12.0–46.0)
Lymphs Abs: 1.4 10*3/uL (ref 0.7–4.0)
MCHC: 34.1 g/dL (ref 30.0–36.0)
MCV: 98.3 fL (ref 78.0–100.0)
Monocytes Absolute: 0.4 10*3/uL (ref 0.1–1.0)
Monocytes Relative: 6 % (ref 3.0–12.0)
Neutro Abs: 3.9 10*3/uL (ref 1.4–7.7)
Neutrophils Relative %: 64.9 % (ref 43.0–77.0)
Platelets: 223 10*3/uL (ref 150.0–400.0)
Potassium: 4.2 meq/L (ref 3.5–5.1)
RBC: 3.58 M/uL — ABNORMAL LOW (ref 3.87–5.11)
RDW: 12.9 % (ref 11.5–14.6)
Sodium: 145 meq/L (ref 135–145)
TSH: 1.88 microintl units/mL (ref 0.35–5.50)
Total Bilirubin: 0.3 mg/dL (ref 0.3–1.2)
Total CHOL/HDL Ratio: 8
Total Protein: 6.7 g/dL (ref 6.0–8.3)
Triglycerides: 298 mg/dL — ABNORMAL HIGH (ref 0.0–149.0)
VLDL: 59.6 mg/dL — ABNORMAL HIGH (ref 0.0–40.0)
WBC: 6.1 10*3/uL (ref 4.5–10.5)

## 2009-08-19 ENCOUNTER — Ambulatory Visit: Payer: Self-pay | Admitting: Internal Medicine

## 2009-08-19 DIAGNOSIS — D485 Neoplasm of uncertain behavior of skin: Secondary | ICD-10-CM | POA: Insufficient documentation

## 2009-08-26 ENCOUNTER — Ambulatory Visit: Payer: Self-pay | Admitting: Internal Medicine

## 2009-08-26 DIAGNOSIS — Z860100 Personal history of colon polyps, unspecified: Secondary | ICD-10-CM | POA: Insufficient documentation

## 2009-08-26 DIAGNOSIS — M359 Systemic involvement of connective tissue, unspecified: Secondary | ICD-10-CM | POA: Insufficient documentation

## 2009-09-15 ENCOUNTER — Encounter (INDEPENDENT_AMBULATORY_CARE_PROVIDER_SITE_OTHER): Payer: Self-pay | Admitting: *Deleted

## 2009-10-07 ENCOUNTER — Ambulatory Visit: Payer: Self-pay | Admitting: Internal Medicine

## 2009-10-21 ENCOUNTER — Telehealth: Payer: Self-pay | Admitting: Internal Medicine

## 2009-10-21 ENCOUNTER — Ambulatory Visit: Payer: Self-pay | Admitting: Internal Medicine

## 2009-10-21 DIAGNOSIS — G43909 Migraine, unspecified, not intractable, without status migrainosus: Secondary | ICD-10-CM | POA: Insufficient documentation

## 2009-10-28 ENCOUNTER — Ambulatory Visit: Payer: Self-pay | Admitting: Internal Medicine

## 2009-10-28 LAB — HM COLONOSCOPY: HM Colonoscopy: 5

## 2009-11-02 ENCOUNTER — Encounter: Payer: Self-pay | Admitting: Internal Medicine

## 2009-11-11 ENCOUNTER — Ambulatory Visit: Payer: Self-pay | Admitting: Internal Medicine

## 2009-11-11 DIAGNOSIS — R519 Headache, unspecified: Secondary | ICD-10-CM | POA: Insufficient documentation

## 2009-11-11 DIAGNOSIS — R51 Headache: Secondary | ICD-10-CM | POA: Insufficient documentation

## 2009-12-28 ENCOUNTER — Telehealth: Payer: Self-pay | Admitting: Internal Medicine

## 2010-01-20 ENCOUNTER — Ambulatory Visit: Payer: Self-pay | Admitting: Internal Medicine

## 2010-01-20 DIAGNOSIS — R400 Somnolence: Secondary | ICD-10-CM | POA: Insufficient documentation

## 2010-01-20 LAB — CONVERTED CEMR LAB
ALT: 11 units/L (ref 0–35)
AST: 13 units/L (ref 0–37)
Albumin: 3.4 g/dL — ABNORMAL LOW (ref 3.5–5.2)
Alkaline Phosphatase: 79 units/L (ref 39–117)
BUN: 8 mg/dL (ref 6–23)
Basophils Absolute: 0.1 10*3/uL (ref 0.0–0.1)
Basophils Relative: 0.9 % (ref 0.0–3.0)
Bilirubin, Direct: 0.1 mg/dL (ref 0.0–0.3)
CO2: 28 meq/L (ref 19–32)
Calcium: 8.7 mg/dL (ref 8.4–10.5)
Chloride: 104 meq/L (ref 96–112)
Creatinine, Ser: 1 mg/dL (ref 0.4–1.2)
Eosinophils Absolute: 0.6 10*3/uL (ref 0.0–0.7)
Eosinophils Relative: 8.6 % — ABNORMAL HIGH (ref 0.0–5.0)
GFR calc non Af Amer: 59.56 mL/min (ref >60)
Glucose, Bld: 94 mg/dL (ref 70–99)
HCT: 34.1 % — ABNORMAL LOW (ref 36.0–46.0)
Hemoglobin: 11.7 g/dL — ABNORMAL LOW (ref 12.0–15.0)
Lymphocytes Relative: 19.7 % (ref 12.0–46.0)
Lymphs Abs: 1.5 10*3/uL (ref 0.7–4.0)
MCHC: 34.4 g/dL (ref 30.0–36.0)
MCV: 99.5 fL (ref 78.0–100.0)
Monocytes Absolute: 0.5 10*3/uL (ref 0.1–1.0)
Monocytes Relative: 7.2 % (ref 3.0–12.0)
Neutro Abs: 4.8 10*3/uL (ref 1.4–7.7)
Neutrophils Relative %: 63.6 % (ref 43.0–77.0)
Phenytoin Lvl: 5.5 ug/mL — ABNORMAL LOW (ref 10.0–20.0)
Platelets: 233 10*3/uL (ref 150.0–400.0)
Potassium: 4 meq/L (ref 3.5–5.1)
RBC: 3.43 M/uL — ABNORMAL LOW (ref 3.87–5.11)
RDW: 14.6 % (ref 11.5–14.6)
Sodium: 143 meq/L (ref 135–145)
TSH: 0.92 microintl units/mL (ref 0.35–5.50)
Total Bilirubin: 0.4 mg/dL (ref 0.3–1.2)
Total Protein: 5.9 g/dL — ABNORMAL LOW (ref 6.0–8.3)
WBC: 7.5 10*3/uL (ref 4.5–10.5)

## 2010-02-23 ENCOUNTER — Telehealth: Payer: Self-pay | Admitting: Internal Medicine

## 2010-06-09 ENCOUNTER — Ambulatory Visit: Payer: Self-pay | Admitting: Internal Medicine

## 2010-06-09 ENCOUNTER — Encounter: Payer: Self-pay | Admitting: Internal Medicine

## 2010-06-09 DIAGNOSIS — R1319 Other dysphagia: Secondary | ICD-10-CM | POA: Insufficient documentation

## 2010-06-09 DIAGNOSIS — R1310 Dysphagia, unspecified: Secondary | ICD-10-CM

## 2010-06-09 LAB — CONVERTED CEMR LAB: Phenytoin Lvl: 10.5 ug/mL (ref 10.0–20.0)

## 2010-06-13 LAB — CONVERTED CEMR LAB
ALT: 25 units/L (ref 0–35)
AST: 20 units/L (ref 0–37)
Albumin: 3.5 g/dL (ref 3.5–5.2)
Alkaline Phosphatase: 95 units/L (ref 39–117)
BUN: 17 mg/dL (ref 6–23)
Basophils Absolute: 0.1 10*3/uL (ref 0.0–0.1)
Basophils Relative: 1.2 % (ref 0.0–3.0)
Bilirubin, Direct: 0.1 mg/dL (ref 0.0–0.3)
CO2: 31 meq/L (ref 19–32)
Calcium: 9.4 mg/dL (ref 8.4–10.5)
Chloride: 105 meq/L (ref 96–112)
Creatinine, Ser: 1.1 mg/dL (ref 0.4–1.2)
Eosinophils Absolute: 0.3 10*3/uL (ref 0.0–0.7)
Eosinophils Relative: 4.5 % (ref 0.0–5.0)
GFR calc non Af Amer: 53.95 mL/min — ABNORMAL LOW (ref >60.00)
Glucose, Bld: 77 mg/dL (ref 70–99)
HCT: 36 % (ref 36.0–46.0)
Hemoglobin: 12.3 g/dL (ref 12.0–15.0)
Lymphocytes Relative: 27.5 % (ref 12.0–46.0)
Lymphs Abs: 1.9 10*3/uL (ref 0.7–4.0)
MCHC: 34 g/dL (ref 30.0–36.0)
MCV: 99.3 fL (ref 78.0–100.0)
Monocytes Absolute: 0.4 10*3/uL (ref 0.1–1.0)
Monocytes Relative: 6.2 % (ref 3.0–12.0)
Neutro Abs: 4.2 10*3/uL (ref 1.4–7.7)
Neutrophils Relative %: 60.6 % (ref 43.0–77.0)
Platelets: 259 10*3/uL (ref 150.0–400.0)
Potassium: 5.6 meq/L — ABNORMAL HIGH (ref 3.5–5.1)
RBC: 3.62 M/uL — ABNORMAL LOW (ref 3.87–5.11)
RDW: 14.1 % (ref 11.5–14.6)
Sodium: 142 meq/L (ref 135–145)
TSH: 2.01 microintl units/mL (ref 0.35–5.50)
Total Bilirubin: 0.3 mg/dL (ref 0.3–1.2)
Total Protein: 6.3 g/dL (ref 6.0–8.3)
WBC: 7 10*3/uL (ref 4.5–10.5)

## 2010-06-16 ENCOUNTER — Encounter: Payer: Self-pay | Admitting: Internal Medicine

## 2010-07-07 ENCOUNTER — Telehealth: Payer: Self-pay | Admitting: Internal Medicine

## 2010-07-08 ENCOUNTER — Other Ambulatory Visit: Payer: Self-pay | Admitting: Internal Medicine

## 2010-07-08 ENCOUNTER — Ambulatory Visit
Admission: RE | Admit: 2010-07-08 | Discharge: 2010-07-08 | Payer: Self-pay | Source: Home / Self Care | Attending: Internal Medicine | Admitting: Internal Medicine

## 2010-07-08 DIAGNOSIS — R1084 Generalized abdominal pain: Secondary | ICD-10-CM | POA: Insufficient documentation

## 2010-07-08 DIAGNOSIS — R142 Eructation: Secondary | ICD-10-CM

## 2010-07-08 DIAGNOSIS — R141 Gas pain: Secondary | ICD-10-CM | POA: Insufficient documentation

## 2010-07-08 DIAGNOSIS — R143 Flatulence: Secondary | ICD-10-CM

## 2010-07-08 LAB — CBC WITH DIFFERENTIAL/PLATELET
Basophils Absolute: 0.1 10*3/uL (ref 0.0–0.1)
Basophils Relative: 0.8 % (ref 0.0–3.0)
Eosinophils Absolute: 0.3 10*3/uL (ref 0.0–0.7)
Eosinophils Relative: 3.5 % (ref 0.0–5.0)
HCT: 35.4 % — ABNORMAL LOW (ref 36.0–46.0)
Hemoglobin: 12 g/dL (ref 12.0–15.0)
Lymphocytes Relative: 21.6 % (ref 12.0–46.0)
Lymphs Abs: 1.7 10*3/uL (ref 0.7–4.0)
MCHC: 34 g/dL (ref 30.0–36.0)
MCV: 100 fl (ref 78.0–100.0)
Monocytes Absolute: 0.5 10*3/uL (ref 0.1–1.0)
Monocytes Relative: 7 % (ref 3.0–12.0)
Neutro Abs: 5.2 10*3/uL (ref 1.4–7.7)
Neutrophils Relative %: 67.1 % (ref 43.0–77.0)
Platelets: 217 10*3/uL (ref 150.0–400.0)
RBC: 3.54 Mil/uL — ABNORMAL LOW (ref 3.87–5.11)
RDW: 14.5 % (ref 11.5–14.6)
WBC: 7.8 10*3/uL (ref 4.5–10.5)

## 2010-07-08 LAB — HEPATIC FUNCTION PANEL
ALT: 31 U/L (ref 0–35)
AST: 16 U/L (ref 0–37)
Albumin: 3 g/dL — ABNORMAL LOW (ref 3.5–5.2)
Alkaline Phosphatase: 115 U/L (ref 39–117)
Bilirubin, Direct: 0.1 mg/dL (ref 0.0–0.3)
Total Bilirubin: 0.4 mg/dL (ref 0.3–1.2)
Total Protein: 6 g/dL (ref 6.0–8.3)

## 2010-07-08 LAB — BASIC METABOLIC PANEL
BUN: 11 mg/dL (ref 6–23)
CO2: 32 mEq/L (ref 19–32)
Calcium: 9.9 mg/dL (ref 8.4–10.5)
Chloride: 103 mEq/L (ref 96–112)
Creatinine, Ser: 1 mg/dL (ref 0.4–1.2)
GFR: 61.54 mL/min (ref >60.00)
Glucose, Bld: 81 mg/dL (ref 70–99)
Potassium: 5.2 mEq/L — ABNORMAL HIGH (ref 3.5–5.1)
Sodium: 140 mEq/L (ref 135–145)

## 2010-07-08 LAB — URINALYSIS
Bilirubin Urine: NEGATIVE
Hemoglobin, Urine: NEGATIVE
Ketones, ur: NEGATIVE
Leukocytes, UA: NEGATIVE
Nitrite: NEGATIVE
Specific Gravity, Urine: 1.025 (ref 1.000–1.030)
Total Protein, Urine: NEGATIVE
Urine Glucose: NEGATIVE
Urobilinogen, UA: 0.2 (ref 0.0–1.0)
pH: 5.5 (ref 5.0–8.0)

## 2010-07-08 LAB — LIPASE: Lipase: 41 U/L (ref 11.0–59.0)

## 2010-07-08 LAB — SEDIMENTATION RATE: Sed Rate: 72 mm/hr — ABNORMAL HIGH (ref 0–22)

## 2010-07-12 ENCOUNTER — Ambulatory Visit: Admit: 2010-07-12 | Payer: Self-pay | Admitting: Internal Medicine

## 2010-07-21 ENCOUNTER — Ambulatory Visit (HOSPITAL_COMMUNITY)
Admission: RE | Admit: 2010-07-21 | Discharge: 2010-07-21 | Payer: Self-pay | Source: Home / Self Care | Attending: Internal Medicine | Admitting: Internal Medicine

## 2010-07-21 ENCOUNTER — Ambulatory Visit
Admission: RE | Admit: 2010-07-21 | Discharge: 2010-07-21 | Payer: Self-pay | Source: Home / Self Care | Attending: Internal Medicine | Admitting: Internal Medicine

## 2010-07-25 ENCOUNTER — Telehealth: Payer: Self-pay | Admitting: Internal Medicine

## 2010-07-25 ENCOUNTER — Encounter: Payer: Self-pay | Admitting: Internal Medicine

## 2010-07-26 NOTE — Progress Notes (Signed)
Summary: PT REQ APT TODAY  Phone Note Call from Patient Call back at [facility] Phone [phone]   Summary of Call: See previous phone note from 11/24. Pt is not better will you see her this week? Initial call taken by: Lamar Sprinkles,  May 28, 2007 11:32 AM  Follow-up for Phone Call        ok Thur 1 pm Follow-up by: Tresa Garter MD,  May 28, 2007 1:11 PM  Additional Follow-up for Phone Call Additional follow up Details #1::        PT WAS TOO BUSY TO TALK [place] ABOUT COMING FOR AN OFFICE VISIT.  A MESSAGE WAS LEFT WITH THE PERSON ANSWERING THE PHONE TO HAVE HER CALL BACK REGARDING AN APPT. Additional Follow-up by: Hilarie Fredrickson,  May 29, 2007 2:00 PM

## 2010-07-26 NOTE — Op Note (Signed)
Summary: EGD: Dr. Adam Phenix H. [facility]  Patient:    Kaylee Hansen                     MRN: [id number] Proc. Date: 07/11/99 Adm. Date:  [id number] Attending:  Janan Halter CC:         Miquel Dunn. Catha Gosselin, M.D.             Meredith Staggers, M.D.                           Operative Report  PROCEDURE:  Upper endoscopy with biopsies.  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  FINDINGS:  Normal examination.  No source of nausea and vomiting seen.  The purpose and risks of the procedure were reviewed with the patient who provided written consent.  She was familiar with it from her prior endoscopy several months ago.  SEDATION:  Fentanyl 75 mcg and Versed 9 mg IV without arrhythmias or desaturation.  DESCRIPTION OF PROCEDURE:  The Olympus video endoscope was passed under direct vision.  The vocal cords were not well seen.  The esophagus was quite easily entered and had a normal appearance, without evidence of reflux esophagitis, varicies, infection, neoplasia, rings, strictures, or any significant hiatal hernia. The stomach was entered.  It contained a small bilious residual and a little bit of mucous, but no retained food.  The gastric mucosa was unremarkable, specifically without evidence of gastritis, erosions, ulcers, polyps, or masses and a retroflexed view of the proximal stomach was normal, without evidence of any hiatal hernia.  The pyloris was somewhat spastic or seemed to have somewhat increased muscle tone, but it was not stenotic and the scope popped through it fairly easily after applying a little bit of general pressure.  The duodenal bulb and second duodenum were normal in appearance.  Random biopsies were obtained from the duodenum and antrum  of the stomach prior  to removal of the scope.  The pyloric channel was reinspected to help make sure  that I was not missing a pyloric channel ulceration.  The patient tolerated this procedure well and there were no apparent complications.  IMPRESSION: 1. Normal upper endoscopy. 2. No source of nausea, vomiting, anorexia, or weight loss identified.  PLAN:  1) Await pathology on random biopsies obtained today.  2) Obtain gastric  emptying scan in the morning to look for functional impediment to gastric emptying. DD:  07/11/99 TD:  07/11/99 Job: [id number]

## 2010-07-26 NOTE — Consult Note (Signed)
Kaylee Hansen, Kaylee Hansen NO.:  [id number]      MEDICAL RECORD NO.:  [id number]          PATIENT TYPE:  INP      LOCATION:  5524                         FACILITY:  [facility]      PHYSICIAN:  Iva Boop, MD,FACGDATE OF BIRTH:  1956-02-08      DATE OF CONSULTATION:  05/12/2009   DATE OF DISCHARGE:                                    CONSULTATION      CHIEF COMPLAINT:  Hematemesis.      ASSESSMENT:  A 55 year old woman who presented with chest pain problems   as well as a headache.  A couple of days prior to admission she had had   impact dysphagia and regurgitated a piece of meat and had a small amount   of hematemesis.  Cardiac workup and a chest CT had been negative for   acute pulmonary and coronary syndromes.  She has a remote history of an   esophageal dilation in 2005.  I think she has acid reflux disease and   peptic stricture and that is the root of these problems most likely.   Note that she had been having heartburn, but only taking Tums.      Other problems include chronic constipation with occasional bright red   blood on the tissue, which is really unchanged.  There is a remote   history of collagenous colitis perhaps as far back as 1990.  Last   colonoscopy in 2005 without significant or serious lesion.  She had some   minimal diverticulosis.      RECOMMENDATIONS AND PLAN:   1. Proceed with upper GI endoscopy to evaluate the chest pain,       dysphagia, and hematemesis.  Note that her hemoglobin did go from       normal to 10.7 with hydration.   2. Anticipate chronic PPI therapy and some outpatient followup to see       how she is doing.  She takes Dulcolax every night with some success       as well as lactulose.  May need to find two in the constipation       regimen.      HISTORY:  As above, this 56 year old white woman with a history of lung   cancer and numerous other medical problems presented with a severe   headache and left-sided chest  pain.  Chest CT was unremarkable with   respect to pulmonary emboli though there were some scarring from prior   radiation and a single enlarged lymph node.  CT of the head was   unrevealing and apparently the headache is somewhat better.  She   described problems beginning about 2 days ago, she choked on a piece of   meat.  She had impact dysphagia, regurgitated that, vomited a small   amount of blood.  She had had an abnormal sensation in her throat and   then started having left-sided chest pain and presented to the emergency   room where workup as above including EKG did not reveal cardiac  problems.  She feels better at this time.  She admits to chronic   intermittent heartburn symptoms, treated with Tums at this time.  There   is no clear history of any weight loss.  There is a chronic constipation   and occasional bright red blood per rectum when she strains the stool.   She has been given a proton pump inhibitor here.      PAST MEDICAL HISTORY:   1. Lung cancer.   2. Chronic obstructive pulmonary disease.   3. Low back pain.   4. Osteoarthritis.   5. Seizure disorder.   6. Hypothyroidism.   7. Avascular necrosis of the left hip.   8. Anxiety.   9. Depression.   10.Stroke in 1997.   11.Collagenous colitis in 1990.   12.Dyslipidemia.   13.Urinary tract infections.   14.Migraine headaches.      FAMILY HISTORY:  Positive for unknown cancer, hypertension, coronary   artery disease in her mother.  Mother died at 70.      SOCIAL HISTORY:  She continues to smoke and has been counseled to quit.   No alcohol or drug use.  She lives in [place].  Smoking a pack per   day.  She did quit after her lung cancer, but then and restarted.      REVIEW OF SYSTEMS:  As noted per HPI.  She has been wheezy and somewhat   short of breath.  She does respond to breathing treatments.  Oxygenation   has been okay on 2 L nasal cannula.  The headache is improved.  The   chest pain is improved,  but has persisted.      All other review of systems are taken and are negative.  Note that she   has had some cough as well.      MEDICATIONS:  In the hospital, she is on,   1. Albuterol nebulizers.   2. Aspirin 81 mg daily.   3. Rocaltrol 0.25 mg daily.   4. Lovenox 40 mg subcu daily.   5. Hydrocodone p.r.n.   6. Dilaudid p.r.n.   7. Synthroid 150 mcg daily.   8. Nicotine patch.   9. Protonix 40 mg daily.   10.Paxil 40 mg daily.   11.Trazodone 300 mg at bedtime.   12.Dilantin 200 mg b.i.d.   13.Ambien p.r.n.      DRUG ALLERGIES:  PENICILLIN, PREDNISONE, MORPHINE SULFATE, OXYCODONE,   and she has been allergic to [facility].      PHYSICAL EXAMINATION:  GENERAL:  Chronically ill middle-aged white woman   looking somewhat older than stated age.   VITAL SIGNS:  Temperature 98.6, pulse 82, respirations 20, blood   pressure 111/60.   HEENT:  The eyes are anicteric.  Mouth is somewhat dry, but free of   lesions.  She has dentures.   NECK:  Supple without mass.   CHEST:  Prior to nebulizer was fairly tight with fair air movement at   best with expiratory wheezes and prolonged expiratory phase.  After the   nebulizer was clear with only a scattered wheeze and good air movement.   HEART:  Distant S1 and S2.  No murmurs or gallops.  I see no jugular   venous distention.   ABDOMEN:  Obese, soft, mildly distended, nontender.  No organomegaly or   mass.   LOWER EXTREMITIES:  Trace peripheral edema.   SKIN:  Warm and dry without acute rash observed.   NEURO:  She is alert  and oriented x3.  She has somewhat of a halting   intermittent speech pattern though the speech is clear.  Affect appears   slightly flat.  Neurologic is grossly nonfocal.      LABORATORY DATA:  Cardiac enzymes have been negative.  TSH is 0.615.   Homocysteine level 5.9.  Lipid profile shows a cholesterol of 307,   triglyceride 145, LDL 240.  CMET normal except for protein 5.7, albumin   2.8.  Her hemoglobin is  10.7 today with an MCV of 99.  Recent B12 level   was normal as an outpatient.  White count and platelets are normal.   Admission CBC was normal.      X-RAYS:  As above.      I appreciate the opportunity to care for this patient.  I have reviewed   old outpatient records, inpatient records, x-rays, and laboratory   testing as well as endoscopy reports.               Iva Boop, MD,FACG   Electronically Signed            CEG/MEDQ  D:  05/12/2009  T:  05/13/2009  Job:  [id number]      cc:   Georgina Quint. Plotnikov, MD

## 2010-07-26 NOTE — Progress Notes (Signed)
Summary: Vicodin Refill  Phone Note Refill Request   Refills Requested: Medication #1:  HYDROCODONE-ACETAMINOPHEN 10-325 MG  TABS 1 q 4h prn Initial call taken by: Lamar Sprinkles,  July 15, 2007 10:23 AM  Follow-up for Phone Call        OK #120, 1 ref Follow-up by: Tresa Garter MD,  July 15, 2007 4:20 PM      Prescriptions: HYDROCODONE-ACETAMINOPHEN 10-325 MG  TABS (HYDROCODONE-ACETAMINOPHEN) 1 q 4h prn  #180 x 1   Entered by:   Lamar Sprinkles   Authorized by:   Tresa Garter MD   Signed by:   Lamar Sprinkles on 07/15/2007   Method used:   Telephoned to ...       [facility].*       [address]       [place], [place]  [address]       Ph: [phone]       Fax: [phone]   RxID:   [id number]

## 2010-07-26 NOTE — Assessment & Plan Note (Signed)
Summary: 2 mo rov / nws #   Vital Signs:  Patient profile:   55 year old female Weight:      182 pounds Temp:     97.7 degrees F oral Pulse rate:   85 / minute BP sitting:   112 / 46  (left arm)  Vitals Entered By: Tora Perches (June 24, 2009 9:15 AM) CC: f/u Is Patient Diabetic? No   Primary Care Provider:  Tresa Garter MD  CC:  f/u.  History of Present Illness: The patient presents for a follow up of back pain, anxiety, esoph ulcer, depression and headaches.   Preventive Screening-Counseling & Management  Alcohol-Tobacco     Smoking Status: current  Current Medications (verified): 1)  Synthroid 150 Mcg  Tabs (Levothyroxine Sodium) .... Once Daily` 2)  Dilantin 100 Mg  Caps (Phenytoin Sodium Extended) .[name].. 100 in Am and and 200 in Pm 3)  Xanax 2 Mg  Tabs (Alprazolam) .... Take One Tablet By Mouth 2-3 Tmes Daily As Needed For Anxiety 4)  Rocaltrol 0.25 Mcg  Caps (Calcitriol) .... Once Daily 5)  Trazodone Hcl 100 Mg Tabs (Trazodone Hcl) .... 2 By Mouth Qhs 6)  Paroxetine Hcl 40 Mg  Tabs (Paroxetine Hcl) .[name].. 1 By Mouth Qam 7)  Hydrocodone-Acetaminophen 10-325 Mg  Tabs (Hydrocodone-Acetaminophen) .[name].. 1 Q 4h As Needed By Mouth 8)  Albuterol Sulfate (2.5 Mg/10ml) 0.083%  Nebu (Albuterol Sulfate) .... Qid 9)  Vitamin D3 1000 Unit  Tabs (Cholecalciferol) .[name].. 1 Qd 10)  Miralax   Powd (Polyethylene Glycol 3350) .[name].. 1 Dose (17g) Dissolved in Water Two Times A Day 11)  Hydroxyzine Hcl 50 Mg Tabs (Hydroxyzine Hcl) .[name].. 1-2 By Mouth At Bedtime As Needed Insomnia 12)  Neurontin 100 Mg Caps (Gabapentin) .[name].. 1 By Mouth Three Times A Day For Pain 13)  Imitrex 100 Mg Tabs (Sumatriptan Succinate) .[name].. 1 By Mouth At The Onset of Migraine. May Repeat Dose in One Hour If Headache Not Resolved 14)  Pantoprazole Sodium 40 Mg Tbec (Pantoprazole Sodium) .[name].. 1 By Mouth Two Times A Day 30 Mins Before Meals 15)  Dulcolax 5 Mg Tbec (Bisacodyl) .... As Needed For  Constipation  Allergies: 1)  ! Pcn 2)  ! Oxycontin (Oxycodone Hcl) 3)  ! Lipitor 4)  Morphine 5)  * Oxycodone 6)  * Duragesic/fentanyl Patch  Past History:  Social History: Last updated: 06/11/2009 Occupation: disabled Married 2 childern Current Smoker Regular exercise-no Alcohol Use - no  Past Medical History: Reviewed history from 06/11/2009 and no changes required. Lung cancer, hx of COPD Low back pain Osteoarthritis  B knees Seizure disorder Hypothyroidism Hip avasc. necrosis L 2007 Anxiety Depression Cerebrovascular accident, hx of 89 Colitis 88 Hyperlipidemia Esophageal Ulcer 04/2009 EGD  Family History: Reviewed history from 06/11/2009 and no changes required. Family History of CAD Female 1st degree relative <60 Family History Diabetes 1st degree relative No FH of Colon Cancer:  Social History: Reviewed history from 06/11/2009 and no changes required. Occupation: disabled Married 2 childern Current Smoker Regular exercise-no Alcohol Use - no  Review of Systems       The patient complains of dyspnea on exertion, difficulty walking, and depression.  The patient denies weight loss, weight gain, prolonged cough, melena, hematochezia, and severe indigestion/heartburn.    Physical Exam  General:  less sleepy and sedated Chronically ill-appearing  Ears:  External ear exam shows no significant lesions or deformities.  Otoscopic examination reveals clear canals, tympanic membranes are intact bilaterally without bulging,  retraction, inflammation or discharge. Hearing is grossly normal bilaterally. Nose:  WNL Mouth:  Not dry Lungs:  Normal respiratory effort, chest expands symmetrically. Lungs with wheezes. Heart:  Normal rate and regular rhythm. S1 and S2 normal without gallop, murmur, click, rub or other extra sounds. Abdomen:  Bowel sounds positive,abdomen soft and non-tender without masses, organomegaly or hernias noted. Msk:  NT spine,  she is in a  w/c Extremities:  trace left pedal edema and trace right pedal edema.   Neurologic:  alert , not confused, able to talk much better.   Skin:  no rashes.   Psych:    flat affect and subdued.     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 1 by mouth two times a day 30 mins before meals  Problem # 2:  FATIGUE (ICD-780.79) Assessment: Unchanged  Problem # 3:  HYPOTENSION (ICD-458.9) Assessment: Improved  Problem # 4:  HYPERLIPIDEMIA (ICD-272.4) Go back on Simvastatin (if tol)  Problem # 5:  GAIT DISTURBANCE (ICD-781.2) Assessment: Unchanged  Problem # 6:  DEPRESSION (ICD-311) Assessment: Improved  Her updated medication list for this problem includes:    Xanax 2 Mg Tabs (Alprazolam) .[name]... Take one tablet by mouth 2-3 tmes daily as needed for anxiety    Trazodone Hcl 100 Mg Tabs (Trazodone hcl) .[name]... 2 by mouth qhs    Paroxetine Hcl 40 Mg Tabs (Paroxetine hcl) .[name]... 1 by mouth qam    Hydroxyzine Hcl 50 Mg Tabs (Hydroxyzine hcl) .[name]... 1-2 by mouth at bedtime as needed insomnia  Complete Medication List: 1)  Synthroid 150 Mcg Tabs (Levothyroxine sodium) .... Once daily` 2)  Dilantin 100 Mg Caps (Phenytoin sodium extended) .[name].. 100 in am and and 200 in pm 3)  Xanax 2 Mg Tabs (Alprazolam) .... Take one tablet by mouth 2-3 tmes daily as needed for anxiety 4)  Rocaltrol 0.25 Mcg Caps (Calcitriol) .... Once daily 5)  Trazodone Hcl 100 Mg Tabs (Trazodone hcl) .... 2 by mouth qhs 6)  Paroxetine Hcl 40 Mg Tabs (Paroxetine hcl) .[name].. 1 by mouth qam 7)  Hydrocodone-acetaminophen 10-325 Mg Tabs (Hydrocodone-acetaminophen) .[name].. 1 q 4h as needed by mouth 8)  Albuterol Sulfate (2.5 Mg/51ml) 0.083% Nebu (Albuterol sulfate) .... Qid 9)  Vitamin D3 1000 Unit Tabs (Cholecalciferol) .[name].. 1 qd 10)  Miralax Powd  (Polyethylene glycol 3350) .[name].. 1 dose (17g) dissolved in water two times a day 11)  Hydroxyzine Hcl 50 Mg Tabs (Hydroxyzine hcl) .[name].. 1-2 by mouth at bedtime as needed insomnia 12)  Neurontin 100 Mg Caps (Gabapentin) .[name].. 1 by mouth three times a day for pain 13)  Imitrex 100 Mg Tabs (Sumatriptan succinate) .[name].. 1 by mouth at the onset of migraine. may repeat dose in one hour if headache not resolved 14)  Pantoprazole Sodium 40 Mg Tbec (Pantoprazole sodium) .[name].. 1 by mouth two times a day 30 mins before meals 15)  Dulcolax 5 Mg Tbec (Bisacodyl) .... As needed for constipation  Patient Instructions: 1)  Please schedule a follow-up appointment in 2 months. 2)  BMP prior to visit, ICD-9: 3)  Hepatic Panel prior to visit, ICD-9: 4)  Lipid Panel prior to visit, ICD-9:272.0 995.20 5)  TSH prior to visit, ICD-9: 6)  CBC w/ Diff prior to visit, ICD-9: Prescriptions: ROCALTROL 0.25 MCG  CAPS (CALCITRIOL) once daily  #30 Each  x 12   Entered and Authorized by:   Tresa Garter MD   Signed by:   Tresa Garter MD on 06/24/2009   Method used:   Print then Give to Patient   RxID:   [id number] PAROXETINE HCL 40 MG  TABS (PAROXETINE HCL) 1 by mouth qam  #90 x 3   Entered and Authorized by:   Tresa Garter MD   Signed by:   Tresa Garter MD on 06/24/2009   Method used:   Print then Give to Patient   RxID:   [id number] SYNTHROID 150 MCG  TABS (LEVOTHYROXINE SODIUM) once daily` Brand medically necessary #30 Each x 11   Entered and Authorized by:   Tresa Garter MD   Signed by:   Tresa Garter MD on 06/24/2009   Method used:   Print then Give to Patient   RxID:   [id number] XANAX 2 MG  TABS (ALPRAZOLAM) Take one tablet by mouth 2-3 tmes daily as needed for anxiety  #90 x 6   Entered and Authorized by:   Tresa Garter MD   Signed by:   Tresa Garter MD on 06/24/2009   Method used:   Print then Give to Patient   RxID:   [id number]

## 2010-07-26 NOTE — Letter (Signed)
Summary: Patient Notice- Polyp Results  [facility]  [address] [place], [place] [address]   Phone: [phone]  Fax: [phone]        Nov 02, 2009 MRN: [id number]    [facility] [address] [place], [place]  [address]    Dear Ms. Crunk,  The polyps removed from your colon were adenomatous. This means that they were pre-cancerous or that  they had the potential to change into cancer over time.   I recommend that you have a repeat colonoscopy in 3 years to determine if you have developed any new polyps over time. If you develop any new rectal bleeding, abdominal pain or significant bowel habit changes, please contact us before then.  Please call us if you are having persistent problems or have questions about your condition that have not been fully answered at this time.  Sincerely,  Iva Boop MD, [facility]  This letter has been electronically signed by your physician.  Appended Document: Patient Notice- Polyp Results letter mailed 5.10.11

## 2010-07-26 NOTE — Assessment & Plan Note (Signed)
Summary: 2 MO ROV /NWS $50   Vital Signs:  Patient profile:   55 year old female Weight:      179 pounds O2 Sat:      94 % Temp:     99.1 degrees F oral Pulse rate:   91 / minute BP sitting:   100 / 60  (left arm)  Vitals Entered By: Tora Perches (April 08, 2009 9:19 AM) CC: f/u Is Patient Diabetic? No   Primary Care Provider:  Tresa Garter MD  CC:  f/u.  History of Present Illness: The patient presents for a follow up of back pain, anxiety, depression and headaches. C/o L leg pain worse x 2 y, mostly L knee pain... C/o chills  Preventive Screening-Counseling & Management  Alcohol-Tobacco     Smoking Status: current     Packs/Day: 1.0  Current Medications (verified): 1)  Synthroid 150 Mcg  Tabs (Levothyroxine Sodium) .... Once Daily` 2)  Dilantin 100 Mg  Caps (Phenytoin Sodium Extended) .[name].. 100 in Am and and 200 in Pm 3)  Xanax 2 Mg  Tabs (Alprazolam) .... Take One Tablet By Mouth 2-3 Tmes Daily As Needed For Anxiety 4)  Rocaltrol 0.25 Mcg  Caps (Calcitriol) .... Once Daily 5)  Trazodone Hcl 100 Mg Tabs (Trazodone Hcl) .... 2 By Mouth Qhs 6)  Paroxetine Hcl 40 Mg  Tabs (Paroxetine Hcl) .[name].. 1 By Mouth Qam 7)  Hydrocodone-Acetaminophen 10-325 Mg  Tabs (Hydrocodone-Acetaminophen) .[name].. 1 Q 4h As Needed By Mouth 8)  Albuterol Sulfate (2.5 Mg/21ml) 0.083%  Nebu (Albuterol Sulfate) .... Qid 9)  Vitamin D3 1000 Unit  Tabs (Cholecalciferol) .[name].. 1 Qd 10)  Enulose 10 Gm/8ml Soln (Lactulose Encephalopathy) .... 30-60 Cc By Mouth Qd 11)  Hydroxyzine Hcl 50 Mg Tabs (Hydroxyzine Hcl) .[name].. 1-2 By Mouth At Bedtime As Needed Insomnia 12)  Neurontin 100 Mg Caps (Gabapentin) .[name].. 1 By Mouth Three Times A Day For Pain 13)  Fludrocortisone Acetate 0.1 Mg Tabs (Fludrocortisone Acetate) .... By Mouth Two Times A Day 2  in Am and 1  Lunch 14)  Imitrex 100 Mg Tabs (Sumatriptan Succinate) .[name].. 1 By Mouth At The Onset of Migraine. May Repeat Dose in One Hour If Headache Not  Resolved  Allergies: 1)  ! Pcn 2)  ! Oxycontin (Oxycodone Hcl) 3)  ! Lipitor 4)  Morphine 5)  * Oxycodone 6)  * Duragesic  Past History:  Past Medical History: Last updated: 10/01/2008 Lung cancer, hx of COPD Low back pain Osteoarthritis  B knees Seizure disorder Hypothyroidism Hip avasc. necrosis L 2007 Anxiety Depression Cerebrovascular accident, hx of 89 Colitis 88 Hyperlipidemia  Social History: Last updated: 05/08/2007 Occupation: disabled Married Current Smoker Regular exercise-no  Social History: Packs/Day:  1.0  Review of Systems       The patient complains of fever and dyspnea on exertion.  The patient denies weight loss, weight gain, chest pain, hemoptysis, and abdominal pain.    Physical Exam  General:  less sleepy and sedated Chronically ill-appearing  Eyes:  No corneal or conjunctival inflammation noted. EOMI. Perrla.  Nose:  WNL Mouth:  Not dry Neck:  No deformities, masses, or tenderness noted. Lungs:  Normal respiratory effort, chest expands symmetrically. Lungs are clear to auscultation, no crackles or wheezes. Heart:  Normal rate and regular rhythm. S1 and S2 normal without gallop, murmur, click, rub or other extra sounds. Abdomen:  Bowel sounds positive,abdomen soft and non-tender without masses, organomegaly or hernias noted. Msk:  NT spine, w/c  Neurologic:  alert , not confused, able to talk.     Impression & Recommendations:  Problem # 1:  FATIGUE (ICD-780.79) Assessment Unchanged  Problem # 2:  HYPOTENSION (ICD-458.9) Assessment: Improved  Orders: TLB-B12, Serum-Total ONLY ([address]-U04) TLB-BMP (Basic Metabolic Panel-BMET) (80048-METABOL) TLB-Hepatic/Liver Function Pnl (80076-HEPATIC) TLB-Udip ONLY (81003-UDIP)  Problem # 3:  GAIT DISTURBANCE (ICD-781.2) Assessment: Unchanged  Problem # 4:  ACUTE CYSTITIS (ICD-595.0) Assessment: Comment Only  Her updated medication list for this problem includes:    Cipro 250 Mg Tabs  (Ciprofloxacin hcl) .[name]... 1 by mouth 2 times daily  Problem # 5:  LOW BACK PAIN (ICD-724.2) Assessment: Unchanged  Her updated medication list for this problem includes:    Hydrocodone-acetaminophen 10-325 Mg Tabs (Hydrocodone-acetaminophen) .[name]... 1 q 4h as needed by mouth  Orders: TLB-B12, Serum-Total ONLY (54098-J19) TLB-BMP (Basic Metabolic Panel-BMET) (80048-METABOL) TLB-Hepatic/Liver Function Pnl (80076-HEPATIC) TLB-Udip ONLY (81003-UDIP)  Problem # 6:  LEG PAIN, LEFT (ICD-729.5) OA Assessment: Deteriorated  Orders: TLB-B12, Serum-Total ONLY (14782-N56) TLB-BMP (Basic Metabolic Panel-BMET) (80048-METABOL) TLB-Hepatic/Liver Function Pnl (80076-HEPATIC) TLB-Udip ONLY (81003-UDIP)  Problem # 7:  MINERALOCORTICOID DEFICIENCY (ICD-255.42) Assessment: Unchanged On prescription therapy   Complete Medication List: 1)  Synthroid 150 Mcg Tabs (Levothyroxine sodium) .... Once daily` 2)  Dilantin 100 Mg Caps (Phenytoin sodium extended) .[name].. 100 in am and and 200 in pm 3)  Xanax 2 Mg Tabs (Alprazolam) .... Take one tablet by mouth 2-3 tmes daily as needed for anxiety 4)  Rocaltrol 0.25 Mcg Caps (Calcitriol) .... Once daily 5)  Trazodone Hcl 100 Mg Tabs (Trazodone hcl) .... 2 by mouth qhs 6)  Paroxetine Hcl 40 Mg Tabs (Paroxetine hcl) .[name].. 1 by mouth qam 7)  Hydrocodone-acetaminophen 10-325 Mg Tabs (Hydrocodone-acetaminophen) .[name].. 1 q 4h as needed by mouth 8)  Albuterol Sulfate (2.5 Mg/36ml) 0.083% Nebu (Albuterol sulfate) .... Qid 9)  Vitamin D3 1000 Unit Tabs (Cholecalciferol) .[name].. 1 qd 10)  Enulose 10 Gm/59ml Soln (Lactulose encephalopathy) .... 30-60 cc by mouth qd 11)  Hydroxyzine Hcl 50 Mg Tabs (Hydroxyzine hcl) .[name].. 1-2 by mouth at bedtime as needed insomnia 12)  Neurontin 100 Mg Caps (Gabapentin) .[name].. 1 by mouth three times a day for pain 13)  Fludrocortisone Acetate 0.1 Mg Tabs (Fludrocortisone acetate) .... By mouth two times a day 2  in am and 1  lunch 14)  Imitrex 100 Mg  Tabs (Sumatriptan succinate) .[name].. 1 by mouth at the onset of migraine. may repeat dose in one hour if headache not resolved 15)  Cipro 250 Mg Tabs (Ciprofloxacin hcl) .[name].. 1 by mouth 2 times daily  Other Orders: Admin 1st Vaccine (21308) Flu Vaccine 19yrs + (65784)  Patient Instructions: 1)  Try to eat more raw plant food, fresh and dry fruit, raw almonds, leafy vegetables, whole foods and less red meat, less animal fat. Poultry and fish is better for you than pork and beef. Avoid processed foods (canned soups, hot dogs, sausage, bacon , frozen dinners). Avoid corn syrup, high fructose syrup or aspartam and Splenda  containing drinks. Honey, Agave and Stevia are better sweeteners. Make your own  dressing with olive oil, wine vinegar, lemon juce, garlic etc. for your salads. 2)  Please schedule a follow-up appointment in 2 months. 3)  Use stretching exercises that I have provided (15 min. or longer every day)  Prescriptions: CIPRO 250 MG TABS (CIPROFLOXACIN HCL) 1 by mouth 2 times daily  #20 x 1   Entered and Authorized by:   Tresa Garter MD  Signed by:   Tresa Garter MD on 04/08/2009   Method used:   Print then Give to Patient   RxID:   [id number] HYDROCODONE-ACETAMINOPHEN 10-325 MG  TABS (HYDROCODONE-ACETAMINOPHEN) 1 q 4h as needed by mouth  #120 x 6   Entered and Authorized by:   Tresa Garter MD   Signed by:   Tresa Garter MD on 04/08/2009   Method used:   Print then Give to Patient   RxID:   [id number]     Flu Vaccine Consent Questions     Do you have a history of severe allergic reactions to this vaccine? no    Any prior history of allergic reactions to egg and/or gelatin? no    Do you have a sensitivity to the preservative Thimersol? no    Do you have a past history of Guillan-Barre Syndrome? no    Do you currently have an acute febrile illness? no    Have you ever had a severe reaction to latex? no    Vaccine information given and  explained to patient? yes    Are you currently pregnant? no    Lot Number:[id number]   Exp Date:12/23/2009   Site Given  Left Deltoid IMbflu

## 2010-07-26 NOTE — Progress Notes (Signed)
Summary: Results  Medications Added ALPRAZOLAM 0.5 MG TABS (ALPRAZOLAM)  AMBIEN CR 12.5 MG TBCR (ZOLPIDEM TARTRATE)  PAXIL 40 MG TABS (PAROXETINE HCL)  TRAZODONE HCL 100 MG TABS (TRAZODONE HCL)  VICODIN HP 10-660 MG TABS (HYDROCODONE-ACETAMINOPHEN)  ZOCOR 20 MG TABS (SIMVASTATIN)        Phone Note Call from Patient   Caller: Patient Reason for Call: Lab or Test Results Summary of Call: Please advise pt of CT results, Pt does not want to wait untill apt 11/12 for results Initial call taken by: Lamar Sprinkles,  April 12, 2007 2:33 PM  Follow-up for Phone Call        Get results tome Follow-up by: Tresa Garter MD,  April 17, 2007 6:31 PM  Additional Follow-up for Phone Call Additional follow up Details #1::        Pt was asking about CT results but pt had an ultrasound. Results are in documents same place as this phone note Additional Follow-up by: Lamar Sprinkles,  April 19, 2007 9:56 AM    Additional Follow-up for Phone Call Additional follow up Details #2::    [place] was OK Follow-up by: Tresa Garter MD,  April 23, 2007 2:20 PM  Additional Follow-up for Phone Call Additional follow up Details #3:: Details for Additional Follow-up Action Taken: Pt informed Additional Follow-up by: Lamar Sprinkles,  April 23, 2007 6:09 PM  New/Updated Medications: ALPRAZOLAM 0.5 MG TABS (ALPRAZOLAM)  AMBIEN CR 12.5 MG TBCR (ZOLPIDEM TARTRATE)  PAXIL 40 MG TABS (PAROXETINE HCL)  TRAZODONE HCL 100 MG TABS (TRAZODONE HCL)  VICODIN HP 10-660 MG TABS (HYDROCODONE-ACETAMINOPHEN)  ZOCOR 20 MG TABS (SIMVASTATIN)

## 2010-07-26 NOTE — Progress Notes (Signed)
Summary: Cough with blood  Phone Note Call from Patient Call back at Home Phone [phone]   Caller: Patient Summary of Call: Patient has been coughing up phlem and blood x 2wks. She has noticed that the blood is  thickern now like" little blood clots". Patient would like MD opinion  and if he thinks that she need to be seen. Patient states the only day that she can get transportation is wednesday am. Please advise  Initial call taken by: Rock Nephew CMA,  May 20, 2007 1:40 PM  Follow-up for Phone Call        Poss. bronchitis. Take Cipro 10d. Hold keflex x 10d. Keep ROV. OV if worse or not better Follow-up by: Tresa Garter MD,  May 20, 2007 10:31 PM  Additional Follow-up for Phone Call Additional follow up Details #1::        Returned to patient to advise per MD Additional Follow-up by: Rock Nephew CMA,  May 21, 2007 11:34 AM    New/Updated Medications: CIPRO 500 MG TABS (CIPROFLOXACIN HCL) 1 by mouth bid   Prescriptions: CIPRO 500 MG TABS (CIPROFLOXACIN HCL) 1 by mouth bid  #20 x 0   Entered and Authorized by:   Tresa Garter MD   Signed by:   Rock Nephew CMA on 05/21/2007   Method used:   Electronically sent to ...       [facility]       [address]       [place], [place]  [address]       Ph: [phone]       Fax: [phone]   RxID:   [id number]

## 2010-07-26 NOTE — Procedures (Signed)
Summary: EGD: Dr. Corinda Gubler   EGD  Procedure date:  09/17/2003  Findings:      Findings: Duodenitis  Findings: Esophagitis  Findings: Gastritis  Location: [facility]   Patient Name: Kaylee Hansen, Kaylee Hansen  . MRN:  Procedure Procedures: Panendoscopy (EGD) CPT: 43235.    with esophageal dilation. CPT: G9296129.  Personnel: Endoscopist: Ulyess Mort, MD.  Referred By: Corwin Levins, MD.  Exam Location: Exam performed in Outpatient Clinic. Outpatient  Patient Consent: Procedure, Alternatives, Risks and Benefits discussed, consent obtained, from patient. Consent was obtained by the RN.  Indications Symptoms: Dysphagia. Dyspepsia, Abdominal pain,  History  Current Medications: Patient is not currently taking Coumadin.  Pre-Exam Physical: Entire physical exam was normal.  Exam Exam Info: Maximum depth of insertion Duodenum, intended Duodenum. Vocal cords visualized. Gastric retroflexion performed. Images were not taken. ASA Classification: II. Tolerance: good.  Sedation Meds: Patient assessed and found to be appropriate for moderate (conscious) sedation. Fentanyl 50 mcg. given IV. Versed 5 mg. given IV. Cetacaine Spray 2 sprays given aerosolized.  Monitoring: BP and pulse monitoring done. Oximetry used. Supplemental O2 given  Findings - HIATAL HERNIA: 3 cms. in length. stricture. ICD9: Esophagitis: 530. 10. - Dilation: Pyloric Sphincter. Maloney dilator used, Diameter: 52 mm, Minimal Resistance, No Heme present on extraction. Patient tolerance good.  - MUCOSAL ABNORMALITY: Pyloric Sphincter to Jejunum. Granular mucosa. ICD9: Duodenitis without Hemorrhage: 535.60.  - MUCOSAL ABNORMALITY: Body to Antrum. Hemorrhage (oozing). Erythematous mucosa. Granular mucosa. RUT done, results pending. ICD9: Gastritis, Chronic: 535.10. Comment: alkaline gastritis.   Assessment Abnormal examination, see findings above.  Diagnoses: 530.10: Esophagitis.  535.10: Gastritis,  Chronic.  535.60: Duodenitis without Hemorrhage.   Events  Unplanned Intervention: No unplanned interventions were required.  Unplanned Events: There were no complications. Plans Medication(s): Await pathology. Continue current medications. Promotility:  carafate:  PPI:   Patient Education: Patient given standard instructions for: Hiatal Hernia. Reflux. Stenosis / Stricture.  Disposition: After procedure patient sent to recovery. After recovery patient sent home.    CC:   Oliver Barre, MD  This report was created from the original endoscopy report, which was reviewed and signed by the above listed endoscopist.

## 2010-07-26 NOTE — Letter (Signed)
Summary: Previsit letter  [facility]  [address] [place], [place] [address]   Phone: [phone]  Fax: [phone]       09/15/2009 MRN: [id number]  [facility] [address] [place], [place]  [address]  Dear Kaylee Hansen,  Welcome to the Gastroenterology Division at [facility].    You are scheduled to see a nurse for your pre-procedure visit on April 28/2011 at 8:30 am on the 3rd floor at [facility], [address]  We ask that you try to arrive at our office 15 minutes prior to your appointment time to allow for check-in.  Your nurse visit will consist of discussing your medical and surgical history, your immediate family medical history, and your medications.    Please bring a complete list of all your medications or, if you prefer, bring the medication bottles and we will list them.  We will need to be aware of both prescribed and over the counter drugs.  We will need to know exact dosage information as well.  If you are on blood thinners (Coumadin, Plavix, Aggrenox, Ticlid, etc.) please call our office today/prior to your appointment, as we need to consult with your physician about holding your medication.   Please be prepared to read and sign documents such as consent forms, a financial agreement, and acknowledgement forms.  If necessary, and with your consent, a friend or relative is welcome to sit-in on the nurse visit with you.  Please bring your insurance card so that we may make a copy of it.  If your insurance requires a referral to see a specialist, please bring your referral form from your primary care physician.  No co-pay is required for this nurse visit.     If you cannot keep your appointment, please call [phone] to cancel or reschedule prior to your appointment date.  This allows [place] the opportunity to schedule an appointment for another patient in need of care.    Thank you for choosing [facility] for your medical  needs.  We appreciate the opportunity to care for you.  Please visit [place] at our website  to learn more about our practice.                     Sincerely.                                                                                                                   The Gastroenterology Division

## 2010-07-26 NOTE — Assessment & Plan Note (Signed)
Summary: FU--PER PT SCOOTER--$50--STC   Vital Signs:  Patient Profile:   55 Years Old Female Weight:      169 pounds Temp:     99 degrees F oral Pulse rate:   88 / minute BP sitting:   100 / 50  (left arm)  Vitals Entered By: Tora Perches (July 02, 2008 8:08 AM)                 Chief Complaint:  Multiple medical problems or concerns.  History of Present Illness: C/o inability to walk due to LBP, B knee pain. Needs to have a power w/c: can't walk , weak The patient presents for a follow up of OA, back pain, anxiety, depression and constipation.      Current Allergies (reviewed today): ! PCN ! OXYCONTIN (OXYCODONE HCL) ! LIPITOR MORPHINE * OXYCODONE * DURAGESIC  Past Medical History:    Reviewed history from 05/28/2008 and no changes required:       Lung cancer, hx of       COPD       Low back pain       Osteoarthritis  B knees       Seizure disorder       Hypothyroidism       Hip avasc. necrosis L 2007       Anxiety       Depression       Cerebrovascular accident, hx of 68       Colitis 64   Family History:    Reviewed history from 05/08/2007 and no changes required:       Family History of CAD Female 1st degree relative <60       Family History Diabetes 1st degree relative  Social History:    Reviewed history from 05/08/2007 and no changes required:       Occupation: disabled       Married       Current Smoker       Regular exercise-no    Review of Systems       The patient complains of dyspnea on exertion, prolonged cough, muscle weakness, difficulty walking, and depression.  The patient denies anorexia, fever, weight loss, chest pain, and angioedema.     Physical Exam  General:     Chronically ill-appearing NAD Eyes:     No corneal or conjunctival inflammation noted. EOMI. Perrla. Funduscopic exam benign, without hemorrhages, exudates or papilledema. Vision grossly normal. Ears:     External ear exam shows no significant lesions or  deformities.  Otoscopic examination reveals clear canals, tympanic membranes are intact bilaterally without bulging, retraction, inflammation or discharge. Hearing is grossly normal bilaterally. Nose:     External nasal examination shows no deformity or inflammation. Nasal mucosa are pink and moist without lesions or exudates. Mouth:     Not dry Neck:     No deformities, masses, or tenderness noted. Lungs:     CTA Heart:     RRR 2/6 systolic heart murmur  Abdomen:     Less tender in upper 1/2 B, no masses, no rigidity, and no rebound tenderness.   Msk:     Cane Lumbar-sacral spine is tender to palpation over paraspinal muscles and painfull with the ROM Can't walk w/o support  Extremities:     trace left pedal edema and trace right pedal edema.   Neurologic:     alert & oriented X3.   Weak LE B Skin:  no rashes.   Cervical Nodes:     No lymphadenopathy noted Inguinal Nodes:     No significant adenopathy Psych:     Oriented X3 and normally interactive.      Impression & Recommendations:  Problem # 1:  GAIT DISTURBANCE (ICD-781.2) Assessment: Deteriorated >45 min Form reviewed and filled out. Needs a power w/c  Problem # 2:  LOW BACK PAIN (ICD-724.2) Assessment: Unchanged  Her updated medication list for this problem includes:    Hydrocodone-acetaminophen 10-325 Mg Tabs (Hydrocodone-acetaminophen) .[name]... 1 q 4h as needed by mouth   Problem # 3:  MINERALOCORTICOID DEFICIENCY (ICD-255.42) Assessment: Comment Only  Problem # 4:  OSTEOARTHRITIS (ICD-715.90)  Her updated medication list for this problem includes:    Hydrocodone-acetaminophen 10-325 Mg Tabs (Hydrocodone-acetaminophen) .[name]... 1 q 4h as needed by mouth   Problem # 5:  LUNG CANCER, HX OF (ICD-V10.11) Assessment: Comment Only  Problem # 6:  CEREBROVASCULAR ACCIDENT, HX OF (ICD-V12.50) Assessment: Comment Only  Complete Medication List: 1)  Synthroid 150 Mcg Tabs (Levothyroxine sodium) .... Once  daily` 2)  Dilantin 100 Mg Caps (Phenytoin sodium extended) .[name].. 1 by mouth bid 3)  Xanax 2 Mg Tabs (Alprazolam) .... Take one tablet by mouth 2-3 tmes daily as needed for anxiety 4)  Ambien 10 Mg Tabs (Zolpidem tartrate) .... Take one tablet by mouth at bedtime for insomnia 5)  Rocaltrol 0.25 Mcg Caps (Calcitriol) .... Once daily 6)  Trazodone Hcl 100 Mg Tabs (Trazodone hcl) 7)  Paroxetine Hcl 40 Mg Tabs (Paroxetine hcl) .[name].. 1 by mouth qam 8)  Hydrocodone-acetaminophen 10-325 Mg Tabs (Hydrocodone-acetaminophen) .[name].. 1 q 4h as needed by mouth 9)  Albuterol Sulfate (2.5 Mg/63ml) 0.083% Nebu (Albuterol sulfate) .... Qid 10)  Vitamin D3 1000 Unit Tabs (Cholecalciferol) .[name].. 1 qd 11)  Enulose 10 Gm/90ml Soln (Lactulose encephalopathy) .... 30-60 cc by mouth qd   Patient Instructions: 1)  Please schedule a follow-up appointment in 2 months. 2)  Use Milk of Magnesia for constipation   ]

## 2010-07-26 NOTE — Progress Notes (Signed)
Summary: REQ FOR RX  Phone Note Call from Patient Call back at [facility] Phone [phone]   Summary of Call: Patient is requesting rx for cipro. She is c/o urinary frequency & pain.  Initial call taken by: Lamar Sprinkles, CMA,  August 09, 2009 2:17 PM  Follow-up for Phone Call        OK Follow-up by: Tresa Garter MD,  August 09, 2009 3:50 PM  Additional Follow-up for Phone Call Additional follow up Details #1::        Pt informed  Additional Follow-up by: Lamar Sprinkles, CMA,  August 09, 2009 4:07 PM    New/Updated Medications: CIPROFLOXACIN HCL 250 MG TABS (CIPROFLOXACIN HCL) 1 by mouth two times a day for cystitis Prescriptions: CIPROFLOXACIN HCL 250 MG TABS (CIPROFLOXACIN HCL) 1 by mouth two times a day for cystitis  #10 x 0   Entered and Authorized by:   Tresa Garter MD   Signed by:   Lamar Sprinkles, CMA on 08/09/2009   Method used:   Electronically to        [facility].* (retail)       [address]       [place], [place]  [address]       Ph: [phone]       Fax: [phone]   RxID:   [id number]

## 2010-07-26 NOTE — Letter (Signed)
Summary: EGD Instructions  [facility]  [address] [place], [place] [address]   Phone: [phone]  Fax: [phone]       Kaylee Hansen    April 25, 1956    MRN: [id number]       Procedure Day Dorna Bloom: Lenor Coffin, 07/08/09     Arrival Time:  10:OO AM     Procedure Time: 11:00 AM_     Location of Procedure:                    _X_  [facility] (4th Floor)  PREPARATION FOR ENDOSCOPY   On THURSDAY, 07/08/09 THE DAY OF THE PROCEDURE:  1.   No solid foods, milk or milk products are allowed after midnight the night before your procedure.  2.   Do not drink anything colored red or purple.  Avoid juices with pulp.  No orange juice.  3.  You may drink clear liquids until 9:00AM, which is 2 hours before your procedure.                                                                                                CLEAR LIQUIDS INCLUDE: Water Jello Ice Popsicles Tea (sugar ok, no milk/cream) Powdered fruit flavored drinks Coffee (sugar ok, no milk/cream) Gatorade Juice: apple, white grape, white cranberry  Lemonade Clear bullion, consomm, broth Carbonated beverages (any kind) Strained chicken noodle soup Hard Candy   MEDICATION INSTRUCTIONS  Unless otherwise instructed, you should take regular prescription medications with a small sip of water as early as possible the morning of your procedure.  Additional medication instructions: NONE_                OTHER INSTRUCTIONS  You will need a responsible adult at least 57 years of age to accompany you and drive you home.   This person must remain in the waiting room during your procedure.  Wear loose fitting clothing that is easily removed.  Leave jewelry and other valuables at home.  However, you may wish to bring a book to read or an iPod/MP3 player to listen to music as you wait for your procedure to start.  Remove all body piercing jewelry and leave at home.  Total time from sign-in until  discharge is approximately 2-3 hours.  You should go home directly after your procedure and rest.  You can resume normal activities the day after your procedure.  The day of your procedure you should not:   Drive   Make legal decisions   Operate machinery   Drink alcohol   Return to work  You will receive specific instructions about eating, activities and medications before you leave.    The above instructions have been reviewed and explained to me by   Judeth Cornfield, CMA    I fully understand and can verbalize these instructions _____________________________ Date 06/11/09

## 2010-07-26 NOTE — Progress Notes (Signed)
Summary: Rf Hydroco/Acetamin  Phone Note Refill Request Message from:  Fax from Pharmacy  Refills Requested: Medication #1:  HYDROCODONE-ACETAMINOPHEN 10-325 MG  TABS 1 q 4h as needed by mouth   Dosage confirmed as above?Dosage Confirmed   Supply Requested: 3 months  Method Requested: Telephone to Pharmacy Next Appointment Scheduled: 01-20-10 Initial call taken by: Lanier Prude, [facility]  December 28, 2009 9:51 AM  Follow-up for Phone Call        ok 3 ref Follow-up by: Tresa Garter MD,  December 29, 2009 8:06 AM    Prescriptions: HYDROCODONE-ACETAMINOPHEN 10-325 MG  TABS (HYDROCODONE-ACETAMINOPHEN) 1 q 4h as needed by mouth  #360 x 0   Entered by:   Lamar Sprinkles, CMA   Authorized by:   Tresa Garter MD   Signed by:   Lamar Sprinkles, CMA on 12/29/2009   Method used:   Telephoned to ...       [facility]  [address] [phone]* (retail)       [address]       [place], [place]  [address]       Ph: [phone] or [phone]       Fax: [phone]   RxID:   [id number]

## 2010-07-26 NOTE — Assessment & Plan Note (Signed)
   Vital Signs:  Patient Profile:   55 Years Old Female Weight:      165 pounds Pulse rate:   82 / minute BP sitting:   90 / 52                 Chief Complaint:  Multiple medical problems or concerns.  Acute Visit History:      She denies constipation.  Other comments include: B knee pain UTI abd ache.  Current Allergies: ! LIPITOR MORPHINE * OXYCODONE * DURAGESIC  Past Medical History:    Lung cancer, hx of    COPD    Low back pain    Osteoarthritis    Seizure disorder    Hypothyroidism  Past Surgical History:    Total hip replacement L      Physical Exam  General:     disheveled and mild distress.   Head:     Normocephalic and atraumatic without obvious abnormalities. No apparent alopecia or balding. Eyes:     No corneal or conjunctival inflammation noted. EOMI. Perrla. Funduscopic exam benign, without hemorrhages, exudates or papilledema. Vision grossly normal. Mouth:     Oral mucosa and oropharynx without lesions or exudates.  Teeth in good repair. Neck:     No deformities, masses, or tenderness noted. Lungs:     Normal respiratory effort, chest expands symmetrically. Lungs are clear to auscultation, no crackles or wheezes. Heart:     Normal rate and regular rhythm. S1 and S2 normal without gallop, murmur, click, rub or other extra sounds. Abdomen:     Bowel sounds positive,abdomen soft and non-tender without masses, organomegaly or hernias noted. Msk:     no joint swelling, no joint warmth, and no joint deformities.  LS tender Extremities:     No clubbing, cyanosis, edema, or deformity noted with normal full range of motion of all joints.   Neurologic:     abnormal gait.   Psych:     slightly anxious and poor concentration.      Impression & Recommendations:  Problem # 1:  HYPOTHYROIDISM (ICD-244.9)  Her updated medication list for this problem includes:    Synthroid 125 Mcg Tabs (Levothyroxine sodium) .[name]... 1 qd   Problem # 2:   MINERALOCORTICOID DEFICIENCY (ICD-255.42) Assessment: Unchanged  Problem # 3:  LOW BACK PAIN (ICD-724.2) Assessment: Deteriorated Add Lyrica Her updated medication list for this problem includes:    Hydrocodone-acetaminophen 10-325 Mg Tabs (Hydrocodone-acetaminophen) .[name]... 1 qid prn   Problem # 4:  PYELONEPHRITIS (ICD-590.80) Assessment: Unchanged Abd. [place]  Complete Medication List: 1)  Synthroid 125 Mcg Tabs (Levothyroxine sodium) .[name].. 1 qd 2)  Fludrocortisone Acetate 0.1 Mg Tabs (Fludrocortisone acetate) .... 2 bid 3)  Lyrica 50 Mg Caps (Pregabalin) .[name].. 1 tid 4)  Miralax Powd (Polyethylene glycol 3350) .[name].. 17g by mouth once daily as needed constipation 5)  Trazodone Hcl 150 Mg Tabs (Trazodone hcl) .[name].. 1 qhs 6)  Hydrocodone-acetaminophen 10-325 Mg Tabs (Hydrocodone-acetaminophen) .[name].. 1 qid prn    Patient Instructions: 1)  Please schedule a follow-up appointment in 1 month.    ]

## 2010-07-26 NOTE — Assessment & Plan Note (Signed)
Summary: 2 MTH FU [facility]   Vital Signs:  Patient profile:   55 year old female Weight:      176 pounds Temp:     97.4 degrees F oral Pulse rate:   84 / minute BP sitting:   86 / 40  (left arm)  Vitals Entered By: Tora Perches (February 11, 2009 10:43 AM) CC: f/u Is Patient Diabetic? No   Primary Care Provider:  Tresa Garter MD  CC:  f/u.  History of Present Illness: The patient presents for a follow up of back pain, anxiety, depression and headaches, UTI, seizures. Fell in a bathtub 3 wks ago - slipped   Current Medications (verified): 1)  Synthroid 150 Mcg  Tabs (Levothyroxine Sodium) .... Once Daily` 2)  Dilantin 100 Mg  Caps (Phenytoin Sodium Extended) .[name].. 100 in Am and and 200 in Pm 3)  Xanax 2 Mg  Tabs (Alprazolam) .... Take One Tablet By Mouth 2-3 Tmes Daily As Needed For Anxiety 4)  Rocaltrol 0.25 Mcg  Caps (Calcitriol) .... Once Daily 5)  Trazodone Hcl 100 Mg Tabs (Trazodone Hcl) .... 2 By Mouth Qhs 6)  Paroxetine Hcl 40 Mg  Tabs (Paroxetine Hcl) .[name].. 1 By Mouth Qam 7)  Hydrocodone-Acetaminophen 10-325 Mg  Tabs (Hydrocodone-Acetaminophen) .[name].. 1 Q 4h As Needed By Mouth 8)  Albuterol Sulfate (2.5 Mg/53ml) 0.083%  Nebu (Albuterol Sulfate) .... Qid 9)  Vitamin D3 1000 Unit  Tabs (Cholecalciferol) .[name].. 1 Qd 10)  Enulose 10 Gm/97ml Soln (Lactulose Encephalopathy) .... 30-60 Cc By Mouth Qd 11)  Hydroxyzine Hcl 50 Mg Tabs (Hydroxyzine Hcl) .[name].. 1-2 By Mouth At Bedtime As Needed Insomnia 12)  Neurontin 100 Mg Caps (Gabapentin) .[name].. 1 By Mouth Three Times A Day For Pain 13)  Fludrocortisone Acetate 0.1 Mg Tabs (Fludrocortisone Acetate) .... By Mouth Two Times A Day 2  in Am and 1  Lunch 14)  Imitrex 100 Mg Tabs (Sumatriptan Succinate) .[name].. 1 By Mouth At The Onset of Migraine. May Repeat Dose in One Hour If Headache Not Resolved  Allergies: 1)  ! Pcn 2)  ! Oxycontin (Oxycodone Hcl) 3)  ! Lipitor 4)  Morphine 5)  * Oxycodone 6)  * Duragesic  Past  History:  Past Medical History: Last updated: 10/01/2008 Lung cancer, hx of COPD Low back pain Osteoarthritis  B knees Seizure disorder Hypothyroidism Hip avasc. necrosis L 2007 Anxiety Depression Cerebrovascular accident, hx of 89 Colitis 88 Hyperlipidemia  Past Surgical History: Last updated: 03/27/2007 Total hip replacement L  Family History: Last updated: 05/08/2007 Family History of CAD Female 1st degree relative <60 Family History Diabetes 1st degree relative  Social History: Last updated: 05/08/2007 Occupation: disabled Married Current Smoker Regular exercise-no  Review of Systems       The patient complains of dyspnea on exertion, abdominal pain, muscle weakness, difficulty walking, depression, and abnormal bleeding.  The patient denies fever, chest pain, syncope, and melena.    Physical Exam  General:  less sleepy and sedated Eyes:  No corneal or conjunctival inflammation noted. EOMI. Perrla.  Ears:  External ear exam shows no significant lesions or deformities.  Otoscopic examination reveals clear canals, tympanic membranes are intact bilaterally without bulging, retraction, inflammation or discharge. Hearing is grossly normal bilaterally. Nose:  WNL Mouth:  Not dry Neck:  No deformities, masses, or tenderness noted. Lungs:  Normal respiratory effort, chest expands symmetrically. Lungs are clear to auscultation, no crackles or wheezes. Heart:  Normal rate and regular rhythm. S1 and S2 normal  without gallop, murmur, click, rub or other extra sounds. Abdomen:  Bowel sounds positive,abdomen soft and non-tender without masses, organomegaly or hernias noted. Msk:  NT spine, w/c Extremities:  trace left pedal edema and trace right pedal edema.   Neurologic:  alert , not confused, able to talk.   Skin:  no rashes.   Psych:    flat affect and subdued.     Impression & Recommendations:  Problem # 1:  FATIGUE (ICD-780.79) Assessment Unchanged The labs were  reviewed with the patient.  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 Take one tablet by mouth 2-3 tmes daily as needed for anxiety    Trazodone Hcl 100 Mg Tabs (Trazodone hcl) .[name]... 2 by mouth qhs    Paroxetine Hcl 40 Mg Tabs (Paroxetine hcl) .[name]... 1 by mouth qam    Hydroxyzine Hcl 50 Mg Tabs (Hydroxyzine hcl) .[name]... 1-2 by mouth at bedtime as needed insomnia  Problem # 5:  ANXIETY (ICD-300.00) Assessment: Comment Only  Her updated medication list for this problem includes:    Xanax 2 Mg Tabs (Alprazolam) .[name]... Take one tablet by mouth 2-3 tmes daily as needed for anxiety    Trazodone Hcl 100 Mg Tabs (Trazodone hcl) .[name]... 2 by mouth qhs    Paroxetine Hcl 40 Mg Tabs (Paroxetine hcl) .[name]... 1 by mouth qam    Hydroxyzine Hcl 50 Mg Tabs (Hydroxyzine hcl) .[name]... 1-2 by mouth at bedtime as needed insomnia  Problem # 6:  COPD (ICD-496) Assessment: Unchanged  Her updated medication list for this problem includes:    Albuterol Sulfate (2.5 Mg/60ml) 0.083% Nebu (Albuterol sulfate) ..... Qid  Complete Medication List: 1)  Synthroid 150 Mcg Tabs (Levothyroxine sodium) .... Once daily` 2)  Dilantin 100 Mg Caps (Phenytoin sodium extended) .[name].. 100 in am and and 200 in pm 3)  Xanax 2 Mg Tabs (Alprazolam) .... Take one tablet by mouth 2-3 tmes daily as needed for anxiety 4)  Rocaltrol 0.25 Mcg Caps (Calcitriol) .... Once daily 5)  Trazodone Hcl 100 Mg Tabs (Trazodone hcl) .... 2 by mouth qhs 6)  Paroxetine Hcl 40 Mg Tabs (Paroxetine hcl) .[name].. 1 by mouth qam 7)  Hydrocodone-acetaminophen 10-325 Mg Tabs (Hydrocodone-acetaminophen) .[name].. 1 q 4h as needed by mouth 8)  Albuterol Sulfate (2.5 Mg/24ml) 0.083% Nebu (Albuterol  sulfate) .... Qid 9)  Vitamin D3 1000 Unit Tabs (Cholecalciferol) .[name].. 1 qd 10)  Enulose 10 Gm/59ml Soln (Lactulose encephalopathy) .... 30-60 cc by mouth qd 11)  Hydroxyzine Hcl 50 Mg Tabs (Hydroxyzine hcl) .[name].. 1-2 by mouth at bedtime as needed insomnia 12)  Neurontin 100 Mg Caps (Gabapentin) .[name].. 1 by mouth three times a day for pain 13)  Fludrocortisone Acetate 0.1 Mg Tabs (Fludrocortisone acetate) .... By mouth two times a day 2  in am and 1  lunch 14)  Imitrex 100 Mg Tabs (Sumatriptan succinate) .[name].. 1 by mouth at the onset of migraine. may repeat dose in one hour if headache not resolved 15)  Cipro 500 Mg Tabs (Ciprofloxacin hcl) .[name].. 1 by mouth 2 times daily  Patient Instructions: 1)  Please schedule a follow-up appointment in 2 months. 2)  Try to eat more raw plant food, fresh and dry fruit, raw almonds, leafy vegetables, whole  foods and less red meat, less animal fat. Poultry and fish is better for you than pork and beef. Avoid processed foods (canned soups, hot dogs, sausage, bacon , frozen dinners). Avoid corn syrup, high fructose syrup or aspartam and Splenda  containing drinks. Honey, Agave and Stevia are better sweeteners. Make your own  dressing with olive oil, wine vinegar, lemon juce, garlic etc. for your salads. Prescriptions: NEURONTIN 100 MG CAPS (GABAPENTIN) 1 by mouth three times a day for pain  #90 x 6   Entered and Authorized by:   Tresa Garter MD   Signed by:   Tresa Garter MD on 02/11/2009   Method used:   Print then Give to Patient   RxID:   [id number] HYDROXYZINE HCL 50 MG TABS (HYDROXYZINE HCL) 1-2 by mouth at bedtime as needed insomnia  #60 x 6   Entered and Authorized by:   Tresa Garter MD   Signed by:   Tresa Garter MD on 02/11/2009   Method used:   Print then Give to Patient   RxID:   [id number] ROCALTROL 0.25 MCG  CAPS (CALCITRIOL) once daily  #30 Each x 12   Entered and Authorized by:   Tresa Garter MD    Signed by:   Tresa Garter MD on 02/11/2009   Method used:   Print then Give to Patient   RxID:   [id number] CIPRO 500 MG TABS (CIPROFLOXACIN HCL) 1 by mouth 2 times daily  #14 x 1   Entered and Authorized by:   Tresa Garter MD   Signed by:   Tresa Garter MD on 02/11/2009   Method used:   Print then Give to Patient   RxID:   [id number]

## 2010-07-26 NOTE — Assessment & Plan Note (Signed)
Summary: STOMACH PROBLEM/$50/PN   Vital Signs:  Patient Profile:   55 Years Old Female Weight:      171 pounds Temp:     97.3 degrees F oral Pulse rate:   96 / minute BP sitting:   106 / 48  (left arm)  Vitals Entered By: Tora Perches (May 13, 2008 4:02 PM)                 Chief Complaint:  Multiple medical problems or concerns.  Acute Visit History:      The patient complains of abdominal pain.  The abdominal pain has been present for 3 weeks.  It is located in the epigastric region.  The pain is described as cramping.  The intensity is described as a 8 on a scale of 1-10.        Pertinent previous surgeries include appendectomy, cholecystectomy, and abdominal surgery.  The patient has no history of inflammatory bowel disease or irritable bowel syndrome.          Current Allergies (reviewed today): ! PCN ! OXYCONTIN (OXYCODONE HCL) ! LIPITOR MORPHINE * OXYCODONE * DURAGESIC  Past Medical History:    Reviewed history from 07/12/2007 and no changes required:       Lung cancer, hx of       COPD       Low back pain       Osteoarthritis       Seizure disorder       Hypothyroidism       Hip avasc. necrosis L 2007  Past Surgical History:    Reviewed history from 03/27/2007 and no changes required:       Total hip replacement L   Family History:    Reviewed history from 05/08/2007 and no changes required:       Family History of CAD Female 1st degree relative <60       Family History Diabetes 1st degree relative  Social History:    Reviewed history from 05/08/2007 and no changes required:       Occupation: disabled       Married       Current Smoker       Regular exercise-no    Review of Systems       Threw up a couple times   Physical Exam  General:     Chronically ill-appearing NAD Eyes:     No corneal or conjunctival inflammation noted. EOMI. Perrla. Funduscopic exam benign, without hemorrhages, exudates or papilledema. Vision grossly  normal. Nose:     External nasal examination shows no deformity or inflammation. Nasal mucosa are pink and moist without lesions or exudates. Mouth:     Not dry Neck:     No deformities, masses, or tenderness noted. Lungs:     CTA Heart:     RRR 2/6 systolic heart murmur  Abdomen:     Tender in upper 1/2 B, no masses, no rigidity, and no rebound tenderness.   Msk:     Cane Lumbar-sacral spine is tender to palpation over paraspinal muscles and painfull with the ROM  Extremities:     No edema Neurologic:     alert & oriented X3.   Skin:     no rashes.   Psych:     Oriented X3 and normally interactive.       Impression & Recommendations:  Problem # 1:  ABDOMINAL PAIN, UNSPECIFIED SITE (ICD-789.00) Assessment: Deteriorated ER records, KUB  and labs on 11/15  were OK - reviewed Orders: TLB-Udip ONLY (81003-UDIP) Radiology Referral (Radiology) Promethazine up to 50mg  (G9562) Demerol  100mg   Injection (Z3086) Admin of Therapeutic Inj  intramuscular or subcutaneous (57846)   Problem # 2:  LOW BACK PAIN (ICD-724.2) Assessment: Unchanged  Her updated medication list for this problem includes:    Hydrocodone-acetaminophen 10-325 Mg Tabs (Hydrocodone-acetaminophen) .[name]... 1 q 4h prn   Problem # 3:  ACUTE CYSTITIS (ICD-595.0) vs Pyelo Assessment: Comment Only  Orders: TLB-Udip w/ Micro (81001-URINE)  The following medications were removed from the medication list:    Keflex 500 Mg Caps (Cephalexin) .[name]... 1 by mouth once daily for bladder  Her updated medication list for this problem includes:    Cipro 500 Mg Tabs (Ciprofloxacin hcl) .[name]... 1 by mouth 2 times daily   Problem # 4:  COPD (ICD-496) Assessment: Unchanged  Her updated medication list for this problem includes:    Albuterol Sulfate (2.5 Mg/74ml) 0.083% Nebu (Albuterol sulfate) ..... Qid   Complete Medication List: 1)  Synthroid 150 Mcg Tabs (Levothyroxine sodium) .... Once daily` 2)  Dilantin 100 Mg  Caps (Phenytoin sodium extended) .[name].. 1 by mouth bid 3)  Xanax 2 Mg Tabs (Alprazolam) .... Take one tablet by mouth 2-3 tmes daily as needed for anxiety 4)  Ambien 10 Mg Tabs (Zolpidem tartrate) .... Take one tablet by mouth at bedtime for insomnia 5)  Rocaltrol 0.25 Mcg Caps (Calcitriol) .... Once daily 6)  Trazodone Hcl 100 Mg Tabs (Trazodone hcl) 7)  Paroxetine Hcl 40 Mg Tabs (Paroxetine hcl) .[name].. 1 by mouth qam 8)  Hydrocodone-acetaminophen 10-325 Mg Tabs (Hydrocodone-acetaminophen) .[name].. 1 q 4h prn 9)  Albuterol Sulfate (2.5 Mg/8ml) 0.083% Nebu (Albuterol sulfate) .... Qid 10)  Vitamin D3 1000 Unit Tabs (Cholecalciferol) .[name].. 1 qd 11)  Cipro 500 Mg Tabs (Ciprofloxacin hcl) .[name].. 1 by mouth 2 times daily   Patient Instructions: 1)  Zegerid 1 two times a day x 4 days, then 1 once daily  2)  Soft diet 3)  Please schedule a follow-up appointment in 1 week.   Prescriptions: CIPRO 500 MG TABS (CIPROFLOXACIN HCL) 1 by mouth 2 times daily  #20 x 0   Entered and Authorized by:   Tresa Garter MD   Signed by:   Tresa Garter MD on 05/14/2008   Method used:   Electronically to        [facility] (retail)       [address]       [place], [place]  [address]       Ph: [phone]       Fax: [phone]   RxID:   [id number]  ]  Medication Administration  Injection # 1:    Medication: Promethazine up to 50mg     Diagnosis: ABDOMINAL PAIN, UNSPECIFIED SITE (ICD-789.00)    Route: IM    Site: RUOQ gluteus    Exp Date: 11/2008    Lot #: [id number]    Mfr: [email or web address]    Comments: 50 mg given    Patient tolerated injection without complications    Given by: Tora Perches (May 13, 2008 5:07 PM)  Injection # 2:    Medication: Demerol  100mg   Injection    Diagnosis: ABDOMINAL PAIN, UNSPECIFIED SITE (ICD-789.00)    Route: IM    Site: RUOQ gluteus    Exp Date: 02/24/2009    Lot #: [id number]    Mfr: [email or web address]  Comments: 50mg  given     Patient tolerated injection without complications    Given by: Tora Perches (May 13, 2008 5:10 PM)  Orders Added: 1)  TLB-Udip ONLY [81003-UDIP] 2)  Radiology Referral [Radiology] 3)  Promethazine up to 50mg  [J2550] 4)  Demerol  100mg   Injection [J2175] 5)  Admin of Therapeutic Inj  intramuscular or subcutaneous [96372] 6)  TLB-Udip w/ Micro [81001-URINE] 7)  Est. Patient Level IV [75643]

## 2010-07-26 NOTE — Assessment & Plan Note (Signed)
Summary: 2 MTH FU-STC   Vital Signs:  Patient Profile:   55 Years Old Female Weight:      165 pounds Temp:     97.3 degrees F oral Pulse rate:   86 / minute BP sitting:   96 / 53  (left arm) Cuff size:   regular  Vitals Entered By: Orlan Leavens (July 12, 2007 8:19 AM)                 Chief Complaint:  need new prescripion for hydrocodone.  History of Present Illness: The patient presents for a follow up of back pain, anxiety, depression and headaches. C/o bronchitis.  Current Allergies: ! PCN ! OXYCONTIN (OXYCODONE HCL) ! LIPITOR MORPHINE * OXYCODONE * DURAGESIC  Past Medical History:    Reviewed history from 03/27/2007 and no changes required:       Lung cancer, hx of       COPD       Low back pain       Osteoarthritis       Seizure disorder       Hypothyroidism       Hip avasc. necrosis L 2007  Past Surgical History:    Reviewed history from 03/27/2007 and no changes required:       Total hip replacement L   Family History:    Reviewed history from 05/08/2007 and no changes required:       Family History of CAD Female 1st degree relative <60       Family History Diabetes 1st degree relative  Social History:    Reviewed history from 05/08/2007 and no changes required:       Occupation: disabled       Married       Current Smoker       Regular exercise-no    Review of Systems       The patient complains of prolonged cough and hemoptysis.  The patient denies melena, hematochezia, and hematuria.     Physical Exam  General:     Chron. ill Head:     Normocephalic and atraumatic without obvious abnormalities. No apparent alopecia or balding. Ears:     External ear exam shows no significant lesions or deformities.  Otoscopic examination reveals clear canals, tympanic membranes are intact bilaterally without bulging, retraction, inflammation or discharge. Hearing is grossly normal bilaterally. Mouth:     Oral mucosa and oropharynx without lesions  or exudates.  Teeth in good repair. Lungs:     Coarse BS B Heart:     Normal rate and regular rhythm. S1 and S2 normal without gallop, murmur, click, rub or other extra sounds. Abdomen:     S/NT Msk:     Cane, slow Neurologic:     alert & oriented X3.   Skin:     Intact without suspicious lesions or rashes Psych:     depressed affect.      Impression & Recommendations:  Problem # 1:  BRONCHITIS, ACUTE (ICD-466.0) She went to [facility]) and had a CXR and a Chest/brain  CT in Dec 2008 - OK per husband Her updated medication list for this problem includes:    Albuterol Sulfate (2.5 Mg/39ml) 0.083% Nebu (Albuterol sulfate) ..... Qid    Keflex 500 Mg Caps (Cephalexin) .[name]... 1 by mouth once daily for bladder   Problem # 2:  COPD (ICD-496) Assessment: Deteriorated  Her updated medication list for this problem includes:    Albuterol Sulfate (2.5 Mg/72ml)  0.083% Nebu (Albuterol sulfate) ..... Qid   Problem # 3:  OSTEOARTHRITIS (ICD-715.90)  Her updated medication list for this problem includes:    Hydrocodone-acetaminophen 10-325 Mg Tabs (Hydrocodone-acetaminophen) .[name]... 1 q 4h prn   Problem # 4:  SEIZURE DISORDER (ICD-780.39) Assessment: Unchanged  The following medications were removed from the medication list:    Dilantin Infatabs 50 Mg Chew (Phenytoin) .[name]... 1 by mouth qhs  Her updated medication list for this problem includes:    Dilantin 100 Mg Caps (Phenytoin sodium extended) .[name]... 1 by mouth bid   Complete Medication List: 1)  Synthroid 150 Mcg Tabs (Levothyroxine sodium) .... Once daily` 2)  Dilantin 100 Mg Caps (Phenytoin sodium extended) .[name].. 1 by mouth bid 3)  Xanax 2 Mg Tabs (Alprazolam) .... Take one tablet by mouth 2-3 tmes daily as needed for anxiety 4)  Ambien 10 Mg Tabs (Zolpidem tartrate) .... Take one tablet by mouth at bedtime for insomnia 5)  Rocaltrol 0.25 Mcg Caps (Calcitriol) .... Once daily 6)  Trazodone Hcl 100 Mg Tabs (Trazodone hcl) 7)   Paroxetine Hcl 40 Mg Tabs (Paroxetine hcl) .[name].. 1 by mouth qam 8)  Hydrocodone-acetaminophen 10-325 Mg Tabs (Hydrocodone-acetaminophen) .[name].. 1 q 4h prn 9)  Albuterol Sulfate (2.5 Mg/12ml) 0.083% Nebu (Albuterol sulfate) .... Qid 10)  Keflex 500 Mg Caps (Cephalexin) .[name].. 1 by mouth once daily for bladder 11)  Vitamin D3 1000 Unit Tabs (Cholecalciferol) .[name].. 1 qd   Patient Instructions: 1)  Please schedule a follow-up appointment in 3 months. 2)  BMP prior to visit, ICD-9: 3)  Hepatic Panel prior to visit, ICD-9: 491.2 4)  TSH prior to visit, ICD-9: 5)  CBC w/ Diff prior to visit, ICD-9: 995.2 6)  Urine Microalbumin prior to visit, ICD-9: 7)  Vit D 268.9    ]

## 2010-07-26 NOTE — Assessment & Plan Note (Signed)
Summary: SEVERE MIGRAINE/PLOT/CD   Vital Signs:  Patient profile:   55 year old female Height:      65 inches Weight:      174 pounds BMI:     29.06 O2 Sat:      94 % on Room air Temp:     98.3 degrees F oral Pulse rate:   84 / minute BP sitting:   100 / 58  (left arm) Cuff size:   regular  Vitals Entered By: Bill Salinas CMA (Nov 11, 2009 1:21 PM)  O2 Flow:  Room air CC: pt here with c/o migrianes x 3 days, pt states zomig not helping/ ab   Primary Care Provider:  Sonda Primes, MD  CC:  pt here with c/o migrianes x 3 days and pt states zomig not helping/ ab.  History of Present Illness: Patient reports severe headache since Monday.  The pain is 12 out of 10 and located in the bitemporal and superior areas.  She describes the pain as throbbing or exploding.  She denies nausea or vomiting.  She does endorse symptoms of aura but is not sure if they have happened with this headache.  She reports unclear vision since Monday.  Light, sound and activity make the headache worse.  Nothing has relieved the headache.  She has tried six doses of Zomig over the past three days with no benefit.  When questioned, she endorses numbness and tingling of her right face which she says is intermittent and started before the onset of headache.  The patient has a history of migraine headaches that generally occur once every three to six weeks.  She says this pain resembles the pain of her usual headaches but is much worse.  In the past she has received trigger point injections with benefit.  She used to use Imitrex with some benefit but her insurance stopped covering it.  She has only recently started using Zomig.    Current Medications (verified): 1)  Synthroid 150 Mcg  Tabs (Levothyroxine Sodium) .... Once Daily` 2)  Dilantin 100 Mg  Caps (Phenytoin Sodium Extended) .[name].. 100 in Am and and 200 in Pm 3)  Xanax 2 Mg  Tabs (Alprazolam) .... Take One Tablet By Mouth 2-3 Tmes Daily As Needed For  Anxiety 4)  Rocaltrol 0.25 Mcg  Caps (Calcitriol) .... Once Daily 5)  Trazodone Hcl 100 Mg Tabs (Trazodone Hcl) .... 2 By Mouth At Bedtime 6)  Paroxetine Hcl 40 Mg  Tabs (Paroxetine Hcl) .[name].. 1 By Mouth Every Morning 7)  Hydrocodone-Acetaminophen 10-325 Mg  Tabs (Hydrocodone-Acetaminophen) .[name].. 1 Q 4h As Needed By Mouth 8)  Albuterol Sulfate (2.5 Mg/39ml) 0.083%  Nebu (Albuterol Sulfate) .... Four Times Daily 9)  Vitamin D3 1000 Unit  Tabs (Cholecalciferol) .... One Tablet By Mouth Once Daily 10)  Miralax   Powd (Polyethylene Glycol 3350) .[name].. 1 Dose (17g) Dissolved in Water Two Times A Day 11)  Hydroxyzine Hcl 50 Mg Tabs (Hydroxyzine Hcl) .[name].. 1-2 By Mouth At Bedtime As Needed Insomnia 12)  Neurontin 100 Mg Caps (Gabapentin) .[name].. 1 By Mouth Three Times A Day For Pain 13)  Dulcolax 5 Mg Tbec (Bisacodyl) .... As Needed For Constipation 14)  Hhn .... As Dirr 15)  Zomig Zmt 5 Mg Tbdp (Zolmitriptan) .[name].. 1 By Mouth Once Daily As Needed Migraine  Allergies (verified): 1)  ! Pcn 2)  ! Oxycontin (Oxycodone Hcl) 3)  ! Lipitor 4)  Morphine 5)  * Oxycodone 6)  * Duragesic/fentanyl Patch  Past History:  Past Medical History: Last updated: 08/26/2009 Lung cancer, hx of COPD Low back pain Osteoarthritis  B knees Seizure disorder Hypothyroidism Hip avasc. necrosis L 2007 Anxiety Depression Cerebrovascular accident, hx of 71 ? Collagenous colitis Hyperlipidemia Esophageal Ulcer 04/2009 EGD Cronic abdominal pain and constipation  Past Surgical History: Last updated: 10/21/2009 Total hip replacement L Appendectomy Hysterectomy, complete 1992 Exploratory Abdominal Surgery Cholecystectomy  Family History: Last updated: 10/07/2009 Family History of CAD Female 1st degree relative <60: Mother, Grandmother, Grandfather Family History Diabetes 1st degree relative: Son, Mother No FH of Colon Cancer: Family History of Kidney Disease: Aunt  Social History: Last updated:  10/07/2009 Occupation: disabled Married 2 childern Current Smoker-1 ppd Regular exercise-no Alcohol Use - no  Review of Systems       The patient complains of vision loss, prolonged cough, and headaches.  The patient denies anorexia, fever, weight loss, weight gain, decreased hearing, chest pain, abdominal pain, and muscle weakness.    Physical Exam  General:  Alert, obese woman sitting in a wheelchair Head:  Normocephalic, atraumatic.   Eyes:  vision grossly intact, pupils equal, pupils round, pupils reactive to light, no injection, no iris abnormalities, no optic disk abnormalities, and no retinal abnormalitiies.   Ears:  no external deformities.   Nose:  no external deformity.   Neck:  supple, no masses, and no thyromegaly.   Lungs:  Patient coughing intermittently with congestion.  Normal respiratory effort with normal breath sounds.  No crackles or wheezes. Heart:  normal rate, regular rhythm, no murmur, no gallop, and no rub.   Abdomen:  soft, non-tender, no distention, no masses, and no guarding.   Neurologic:  Alert and oriented x3.  Mild facial asymmetry with flattening of the right nasolabial fold.  EOM exam reveals reduced abduction of the right eye.  Eye raise symmetrical, smile mildly asymmetrical.  Gross hearing loss in right ear.  CN IX, XI, XII intact.  Strength 5/5 in bilateral upper and lower extremities. Cervical Nodes:  no anterior cervical adenopathy and no posterior cervical adenopathy.     Impression & Recommendations:  Problem # 1:  HEADACHE (ICD-784.0) Patient is in office with three day headache that is significantly worse than previous headache that is not response to triptan medication.  Patient also has concerning neurological findings.  She has past medical history that is significant for CVA, brain tumor, and small cell lung cancer.  Although the headache has similar characteristics to her previous migraine headaches, there is reason to be concerned that  this headache may represent some underlying pathology.  Plan- CT scan of brain without contrast          Toradol 60mg  IM in office  Her updated medication list for this problem includes:    Hydrocodone-acetaminophen 10-325 Mg Tabs (Hydrocodone-acetaminophen) .[name]... 1 q 4h as needed by mouth    Zomig Zmt 5 Mg Tbdp (Zolmitriptan) .[name]... 1 by mouth once daily as needed migraine  addendum- preliminary report on CT - no acute abnormality.   Complete Medication List: 1)  Synthroid 150 Mcg Tabs (Levothyroxine sodium) .... Once daily` 2)  Dilantin 100 Mg Caps (Phenytoin sodium extended) .[name].. 100 in am and and 200 in pm 3)  Xanax 2 Mg Tabs (Alprazolam) .... Take one tablet by mouth 2-3 tmes daily as needed for anxiety 4)  Rocaltrol 0.25 Mcg Caps (Calcitriol) .... Once daily 5)  Trazodone Hcl 100 Mg Tabs (Trazodone hcl) .... 2 by mouth at bedtime 6)  Paroxetine Hcl  40 Mg Tabs (Paroxetine hcl) .[name].. 1 by mouth every morning 7)  Hydrocodone-acetaminophen 10-325 Mg Tabs (Hydrocodone-acetaminophen) .[name].. 1 q 4h as needed by mouth 8)  Albuterol Sulfate (2.5 Mg/46ml) 0.083% Nebu (Albuterol sulfate) .... Four times daily 9)  Vitamin D3 1000 Unit Tabs (Cholecalciferol) .... One tablet by mouth once daily 10)  Miralax Powd (Polyethylene glycol 3350) .[name].. 1 dose (17g) dissolved in water two times a day 11)  Hydroxyzine Hcl 50 Mg Tabs (Hydroxyzine hcl) .[name].. 1-2 by mouth at bedtime as needed insomnia 12)  Neurontin 100 Mg Caps (Gabapentin) .[name].. 1 by mouth three times a day for pain 13)  Dulcolax 5 Mg Tbec (Bisacodyl) .... As needed for constipation 14)  Hhn  .... As dirr 15)  Zomig Zmt 5 Mg Tbdp (Zolmitriptan) .[name].. 1 by mouth once daily as needed migraine  Other Orders: Radiology Referral (Radiology) Admin of Therapeutic Inj  intramuscular or subcutaneous (04540) Ketorolac-Toradol 15mg  (J8119)   Medication Administration  Injection # 1:    Medication: Ketorolac-Toradol 15mg     Diagnosis: MIGRAINE HEADACHE  (ICD-346.90)    Route: IM    Site: L deltoid    Exp Date: 10/25/2010    Lot #: [id number]    Mfr: hospira    Patient tolerated injection without complications    Given by: Ami Bullins CMA (Nov 11, 2009 2:32 PM)  Orders Added: 1)  Radiology Referral [Radiology] 2)  Admin of Therapeutic Inj  intramuscular or subcutaneous [96372] 3)  Ketorolac-Toradol 15mg  [J1885] 4)  Est. Patient Level IV [14782]

## 2010-07-26 NOTE — Progress Notes (Signed)
Summary: paperwork  Phone Note Other Incoming   Call placed by: Nestor Ramp - [employer]  [phone] ext [phone] Reason for Call: Get patient information Summary of Call: Devanay from sc[employer] store called and stated she is waiting for the confirmation for the scooter to be signed and faxed back...Marland KitchenMarland Kitchen sheet was placed on your desk last thursday..... Do you still have the sheet Dr. Posey Rea? Initial call taken by: Windell Norfolk,  July 13, 2008 3:23 PM  Follow-up for Phone Call        I think you gave it to me to initial  last Thur and you faxed it right then... Follow-up by: Tresa Garter MD,  July 13, 2008 5:48 PM  Additional Follow-up for Phone Call Additional follow up Details #1::        oh  that was a different paper for the scooter store....this was a final confirmation paper of the full order she faxed late friday before we left ....but no worries I called for another copy to fax Additional Follow-up by: Windell Norfolk,  July 14, 2008 8:52 AM    Additional Follow-up for Phone Call Additional follow up Details #2::    done Dr. Macario Golds signed and faxed back.... called Devanay the scooter store rep and made her aware  Follow-up by: Windell Norfolk,  July 14, 2008 11:05 AM

## 2010-07-26 NOTE — Assessment & Plan Note (Signed)
Summary: hosp f.u swallowing problems...em   History of Present Illness Visit Type: follow up  Primary GI MD: Stan Head MD [facility] Primary Provider: Tresa Garter MD Requesting Provider: n/a Chief Complaint: prior esophageal ulcer, dysphagia, abdominal pain, constipation History of Present Illness:   This is a 55 year old chronically ill lady who was found to have a long deep distal esophageal ulcer in mid November. She was complaining of dysphagia, an 18 mm balloon was dilated but had minimal effect if any at the endoscopy. She also had a hiatal hernia. She was placed on pantoprazole twice daily and is significantly improved though complains of persistent epigastric pain, sometimes after she eats but it can really presented any time. It does not seem to be awakening her at night. Her dysphagia is better as well.  She continues to struggle with constipation, she is now on MiraLax daily but still only moves her bowels about twice a week.  Her husband is here and provides some of the history as well.   GI Review of Systems    Reports abdominal pain, acid reflux, bloating, dysphagia with solids, and  heartburn.     Location of  Abdominal pain: upper abdomen.    Denies belching, chest pain, dysphagia with liquids, loss of appetite, nausea, vomiting, vomiting blood, weight loss, and  weight gain.      Reports constipation and  diarrhea.     Denies anal fissure, black tarry stools, change in bowel habit, diverticulosis, fecal incontinence, heme positive stool, hemorrhoids, irritable bowel syndrome, jaundice, light color stool, liver problems, rectal bleeding, and  rectal pain.    Current Medications (verified): 1)  Synthroid 150 Mcg  Tabs (Levothyroxine Sodium) .... Once Daily` 2)  Dilantin 100 Mg  Caps (Phenytoin Sodium Extended) .[name].. 100 in Am and and 200 in Pm 3)  Xanax 2 Mg  Tabs (Alprazolam) .... Take One Tablet By Mouth 2-3 Tmes Daily As Needed For Anxiety 4)  Rocaltrol 0.25 Mcg   Caps (Calcitriol) .... Once Daily 5)  Trazodone Hcl 100 Mg Tabs (Trazodone Hcl) .... 2 By Mouth Qhs 6)  Paroxetine Hcl 40 Mg  Tabs (Paroxetine Hcl) .[name].. 1 By Mouth Qam 7)  Hydrocodone-Acetaminophen 10-325 Mg  Tabs (Hydrocodone-Acetaminophen) .[name].. 1 Q 4h As Needed By Mouth 8)  Albuterol Sulfate (2.5 Mg/16ml) 0.083%  Nebu (Albuterol Sulfate) .... Qid 9)  Vitamin D3 1000 Unit  Tabs (Cholecalciferol) .[name].. 1 Qd 10)  Miralax   Powd (Polyethylene Glycol 3350) .[name].. 1 Dose Daily 17g 11)  Hydroxyzine Hcl 50 Mg Tabs (Hydroxyzine Hcl) .[name].. 1-2 By Mouth At Bedtime As Needed Insomnia 12)  Neurontin 100 Mg Caps (Gabapentin) .[name].. 1 By Mouth Three Times A Day For Pain 13)  Imitrex 100 Mg Tabs (Sumatriptan Succinate) .[name].. 1 By Mouth At The Onset of Migraine. May Repeat Dose in One Hour If Headache Not Resolved 14)  Pantoprazole Sodium 40 Mg Tbec (Pantoprazole Sodium) .[name].. 1 By Mouth Two Times A Day 30 Mins Before Meals  Allergies (verified): 1)  ! Pcn 2)  ! Oxycontin (Oxycodone Hcl) 3)  ! Lipitor 4)  Morphine 5)  * Oxycodone 6)  * Duragesic/fentanyl Patch  Past History:  Past Medical History: Lung cancer, hx of COPD Low back pain Osteoarthritis  B knees Seizure disorder Hypothyroidism Hip avasc. necrosis L 2007 Anxiety Depression Cerebrovascular accident, hx of 89 Colitis 88 Hyperlipidemia Esophageal Ulcer 04/2009 EGD  Past Surgical History: Total hip replacement L Appendectomy Hysterectomy  Family History: Family History of CAD Female  1st degree relative <60 Family History Diabetes 1st degree relative No FH of Colon Cancer:  Social History: Occupation: disabled Married 2 childern Current Smoker Regular exercise-no Alcohol Use - no  Vital Signs:  Patient profile:   55 year old female Height:      65 inches Weight:      181 pounds BMI:     30.23 BSA:     1.90 Pulse rate:   96 / minute Pulse rhythm:   regular BP sitting:   100 / 60  (left arm) Cuff size:    regular  Vitals Entered By: Ok Anis CMA (June 11, 2009 3:02 PM)  Physical Exam  General:  less sleepy and sedated Chronically ill-appearing  Lungs:  rhonchi Heart:  Regular rate and rhythm; no murmurs, rubs,  or bruits. 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 This will need reassessment after at least 2 months of b.i.d. PPI therapy.   Orders: EGD (EGD)  Problem # 2:  EPIGASTRIC PAIN (ICD-789.06) She has persistent upper abdominal pain, it seems like it is better. There was some pain when she cancer upper abdominal muscle so it could be somewhat musculoskeletal she does have some coughing which could trigger that. The xiphoid and ribs did not appear to be tender. Await followup EGD to see if the esophageal ulcer is persistent as this may be causing some of this. Perhaps some of it is related to constipation problems and hopefully with more laxatives this will improve.  Orders: EGD (EGD)  Problem # 3:  CONSTIPATION, CHRONIC (ICD-564.09) Assessment: Unchanged two times a day miralax and as needed dulclolax  Patient Instructions: 1)  Increase the MraLax to 2 doses a day and if no bowel movenment after 3 days then take 1-2 Dulcolax. 2)  We will see you at your procedure on 07/08/09. 3)  Please pick up your medications at your pharmacy.  4)  The medication list was reviewed and reconciled.  All changed / newly prescribed medications were explained.  A complete medication list was provided to the patient / caregiver. Prescriptions: PANTOPRAZOLE SODIUM 40 MG TBEC (PANTOPRAZOLE SODIUM) 1 by mouth two times a day 30 mins before meals  #60 x 2   Entered and Authorized by:   Iva Boop MD,  [facility]   Signed by:   Iva Boop MD, FACG on 06/11/2009   Method used:   Electronically to        [facility] (retail)       [address]       [place], [place]  [address]       Ph: [phone]       Fax: [phone]   RxID:   [id number]

## 2010-07-26 NOTE — Progress Notes (Signed)
Summary: Alprazolam RF  Phone Note Refill Request Message from:  Fax from Pharmacy  Refills Requested: Medication #1:  XANAX 2 MG  TABS Take one tablet by mouth 2-3 tmes daily as needed for anxiety   Dosage confirmed as above?Dosage Confirmed   Supply Requested: 270   Last Refilled: 11/21/2009  Method Requested: Telephone to Pharmacy Initial call taken by: Lanier Prude, [facility]),  February 23, 2010 9:31 AM  Follow-up for Phone Call        ok x6 Follow-up by: Tresa Garter MD,  February 23, 2010 11:17 AM    Prescriptions: Prudy Feeler 2 MG  TABS (ALPRAZOLAM) Take one tablet by mouth 2-3 tmes daily as needed for anxiety  #270 x 1   Entered by:   Lamar Sprinkles, CMA   Authorized by:   Tresa Garter MD   Signed by:   Lamar Sprinkles, CMA on 02/23/2010   Method used:   Telephoned to ...       [facility]  [address] [phone]* (retail)       [address]       [place], [place]  [address]       Ph: [phone] or [phone]       Fax: [phone]   RxID:   [id number]

## 2010-07-26 NOTE — Procedures (Signed)
Summary: Upper Endoscopy  Patient: Kaylee Hansen Note: All result statuses are Final unless otherwise noted.  Tests: (1) Upper Endoscopy (EGD)   EGD Upper Endoscopy       DONE     [facility]     [address]     [place], [place]  [address]           ENDOSCOPY PROCEDURE REPORT           PATIENT:  Kaylee Hansen  MR#:  [id number]     BIRTHDATE:  Feb 02, 1956, 53 yrs. old  GENDER:  female           ENDOSCOPIST:  Iva Boop, MD, [facility]           PROCEDURE DATE:  07/08/2009     PROCEDURE:  EGD with biopsy     ASA CLASS:  Class III     INDICATIONS:  follow-up of esophageal ulcer, epigastric pain           MEDICATIONS:   Fentanyl 75 mcg IV, Versed 6 mg IV     TOPICAL ANESTHETIC:  Exactacain Spray           DESCRIPTION OF PROCEDURE:   After the risks benefits and     alternatives of the procedure were thoroughly explained, informed     consent was obtained.  The  endoscope was introduced through the     mouth and advanced to the second portion of the duodenum, without     limitations.  The instrument was slowly withdrawn as the mucosa     was fully examined.     <<PROCEDUREIMAGES>>           There were columnar mucosal changes consistent with Barrett's     esophagus identified. in the distal esophagus. Several small     islands of suspected columnar mucosa, above z-line (at 35 cm).     Multiple biopsies were obtained and sent to pathology.  A hiatal     hernia was found. It was 3 cm in size. 35-38 cm, sliding.     Abnormal appearing mucosa in the angulus. Submucosal bulge with     mucosal deopressions on anguls, not seen or appreciated on     November 2010 exam. Covers most of angulus. Multiple biopsies were     obtained and sent to pathology.  Otherwise the examination was     normal.    Retroflexed views revealed no abnormalities.    The     scope was then withdrawn from the patient and the procedure     completed.           COMPLICATIONS:  None        ENDOSCOPIC IMPRESSION:     1) Columnar mucosa, ? Barrett's in the distal esophagus     (islands)     2) 3 cm hiatal hernia - sliding     3) Abnormal mucosa in the angulus - submucosal bulge - biopsied           4) Otherwise normal examination     RECOMMENDATIONS:     1) await biopsy results     2) Could need other imaging to evaluate submocsal changes in the     stomach.     3) i think at least some of her epigastric pain is     musculoskeletal     4) She needs to use 1-2 Dulcolax if no bowel movement after 3  days           REPEAT EXAM:  as needed           Iva Boop, MD, [facility]           CC:  The Patient           n.     eSIGNED:   Iva Boop at 07/08/2009 10:49 AM           Tora Duck, [id number]  Note: An exclamation mark (!) indicates a result that was not dispersed into the flowsheet. Document Creation Date: 07/08/2009 10:50 AM _______________________________________________________________________  (1) Order result status: Final Collection or observation date-time: 07/08/2009 10:28 Requested date-time:  Receipt date-time:  Reported date-time:  Referring Physician:   Ordering Physician: Stan Head [phone]) Specimen Source:  Source: Launa Grill Order Number: [id number] Lab site:

## 2010-07-26 NOTE — Progress Notes (Signed)
Summary: mobility eval paperwork  Phone Note Other Incoming   Call placed by: Devanay [phone] ext [phone] Summary of Call: Devonat called from the scooter store and stated pt was in recently for mobility eval and she contacted them stating you had questions about what they needed from you because part of the form was already filled out by ortho doctor  .................she stated for you to fill in blanks that the orthopedic doctor left .....Marland Kitchenand to make sure your signature is on each page...and appt date needs to be the date she was in this office 07/02/08 on the form....and also a chart note .[name]...fax ATTNLinzie Collin [phone] Initial call taken by: Windell Norfolk,  July 06, 2008 1:12 PM  Follow-up for Phone Call        Done Follow-up by: Tresa Garter MD,  July 07, 2008 4:58 PM  Additional Follow-up for Phone Call Additional follow up Details #1::        waiting on initals    paperwork signed and faxed  Windell Norfolk  July 08, 2008 9:13 AM  Additional Follow-up by: Windell Norfolk,  July 08, 2008 9:02 AM      Appended Document: mobility eval paperwork they faxed another for, which is a confirmation sheet of what you ordered for the pt that they need you to sign and then they can schedule delivery........[name] placed form with faxes from today   Appended Document: mobility eval paperwork Devanay called and wanted to make sure we got the conf. fax that needed to be signed .... conf. we did rec paperwork and we are waiting on signature

## 2010-07-26 NOTE — Progress Notes (Signed)
Summary: Kaylee Hansen  Phone Note Call from Patient   Summary of Call: Need to approve Zolpidem and Hydroc/APAP w/[employer] [phone] Initial call taken by: Tresa Garter MD,  July 02, 2008 8:21 AM  Follow-up for Phone Call        sorry don't understand do these need a Prior Auth....or do you want them called in to [facility] Follow-up by: Windell Norfolk,  July 02, 2008 4:34 PM  Additional Follow-up for Phone Call Additional follow up Details #1::        Sorry: needs author for both and needs Ambien 30 per months Thx,   Debra PA needed plse   Windell Norfolk  July 03, 2008 3:59 PM  Additional Follow-up by: Tresa Garter MD,  July 03, 2008 1:02 AM    Additional Follow-up for Phone Call Additional follow up Details #2::    called and spoke with pharmacy they states pt does not need any type of PA or verification...Marland KitchenMarland Kitchenthey will not fill because pt is also getting same rx of ambien from another pharmacy.... for example we gave on 12/3 and Dr. Tiburcio Pea did on 12/2...Marland KitchenMarland KitchenMarland Kitchen pt is also getting both filled which she is not suppose to do.... so when they noticed pharmacist refused to fill....called pt back to aware...Marland KitchenMarland Kitchenthey said they will havDr. Harris's office call the pharm and withdraw there rx.......they had no idea what was going on Follow-up by: Windell Norfolk,  July 03, 2008 5:57 PM

## 2010-07-26 NOTE — Procedures (Signed)
Summary: Colonoscopy  Patient: [name] Note: All result statuses are Final unless otherwise noted.  Tests: (1) Colonoscopy (COL)   COL Colonoscopy           DONE     [facility]     [address]     [place], [place]  [address]           COLONOSCOPY PROCEDURE REPORT           PATIENT:  Kaylee, Hansen  MR#:  [id number]     BIRTHDATE:  1955/12/06, 53 yrs. old  GENDER:  female     ENDOSCOPIST:  Iva Boop, MD, [facility]           PROCEDURE DATE:  10/28/2009     PROCEDURE:  Colonoscopy with snare polypectomy     ASA CLASS:  Class III     INDICATIONS:  history of pre-cancerous (adenomatous) colon polyps,     surveillance and high-risk screening adenoma removed 2000, no     polyps 2005     MEDICATIONS:   Fentanyl 50 mcg IV, Versed 4 mg IV           DESCRIPTION OF PROCEDURE:   After the risks benefits and     alternatives of the procedure were thoroughly explained, informed     consent was obtained.  Digital rectal exam was performed and     revealed no abnormalities.   The LB [id number] endoscope     was introduced through the anus and advanced to the cecum, which     was identified by both the appendix and ileocecal valve, without     limitations.  The quality of the prep was excellent, using     MoviPrep.  The instrument was then slowly withdrawn as the colon     was fully examined. Insertion: 4:28 minutes Withdrawal: 12:05     minutes     <<PROCEDUREIMAGES>>           FINDINGS:  Five polyps were found. One cecal (1-2mm), two     transverse (4-5 mm), two sigmoid, 5-7 mm). Polyps were snared     without cautery. Retrieval was successful. snare polyp  This was     otherwise a normal examination of the colon. Right colon     retroflexion also performed.   Retroflexed views in the rectum     revealed no abnormalities.    The scope was then withdrawn from     the patient and the procedure completed.           COMPLICATIONS:  None     ENDOSCOPIC IMPRESSION:   1) Five polyps removed, largest was 7 mm     2) Otherwise normal examination, excellent prep           3) Personal history of adenomatous colon polyp 2000.           RECOMMENDATIONS:     1) Continue current medications           REPEAT EXAM:  In for Colonoscopy, pending biopsy results.           Iva Boop, MD, [facility]           CC:  The Patient           n.     eSIGNED:   Iva Boop at 10/28/2009 09:10 AM           Tora Duck, [id number]  Note: An exclamation mark [name])  indicates a result that was not dispersed into the flowsheet. Document Creation Date: 10/28/2009 9:11 AM _______________________________________________________________________  (1) Order result status: Final Collection or observation date-time: 10/28/2009 08:56 Requested date-time:  Receipt date-time:  Reported date-time:  Referring Physician:   Ordering Physician: Stan Head [phone]) Specimen Source:  Source: Launa Grill Order Number: [id number] Lab site:   Appended Document: Colonoscopy     Procedures Next Due Date:    Colonoscopy: 10/2012

## 2010-07-26 NOTE — Progress Notes (Signed)
Summary: Imitrex pa  Phone Note From Pharmacy   Summary of Call: PA request--Imitrex. Preferred products are maxalt and zomig. Please advise. Initial call taken by: Lucious Groves,  October 21, 2009 3:55 PM  Follow-up for Phone Call        OK Zomig 5 mg  Follow-up by: Tresa Garter MD,  October 21, 2009 10:13 PM    New/Updated Medications: ZOMIG ZMT 5 MG TBDP (ZOLMITRIPTAN) 1 by mouth once daily as needed migraine Prescriptions: ZOMIG ZMT 5 MG TBDP (ZOLMITRIPTAN) 1 by mouth once daily as needed migraine  #12 x 6   Entered and Authorized by:   Tresa Garter MD   Signed by:   Bill Salinas CMA on 10/22/2009   Method used:   [facility] to        [facility]  [address] [phone]* (retail)       [address]       [place], [place]  [address]       Ph: [phone] or [phone]       Fax: [phone]   RxID:   [id number]

## 2010-07-26 NOTE — Assessment & Plan Note (Signed)
Summary: 3 MO ROV /NWS #   Vital Signs:  Patient profile:   55 year old female Height:      65 inches Weight:      175 pounds BMI:     29.23 O2 Sat:      92 % on Room air Temp:     98.4 degrees F oral Pulse rate:   96 / minute Pulse rhythm:   regular Resp:     16 per minute BP sitting:   110 / 60  (left arm) Cuff size:   regular  Vitals Entered By: Lanier Prude, CMA(AAMA) (January 20, 2010 8:56 AM)  O2 Flow:  Room air CC: 3 mo Is Patient Diabetic? No   Primary Care Provider:  Sonda Primes, MD  CC:  3 mo.  History of Present Illness: The patient presents for a follow up of back pain, anxiety, depression and headaches. Sleeps a lot.   Current Medications (verified): 1)  Synthroid 150 Mcg  Tabs (Levothyroxine Sodium) .... Once Daily` 2)  Dilantin 100 Mg  Caps (Phenytoin Sodium Extended) .[name].. 100 in Am and and 200 in Pm 3)  Xanax 2 Mg  Tabs (Alprazolam) .... Take One Tablet By Mouth 2-3 Tmes Daily As Needed For Anxiety 4)  Rocaltrol 0.25 Mcg  Caps (Calcitriol) .... Once Daily 5)  Trazodone Hcl 100 Mg Tabs (Trazodone Hcl) .... 2 By Mouth At Bedtime 6)  Paroxetine Hcl 40 Mg  Tabs (Paroxetine Hcl) .[name].. 1 By Mouth Every Morning 7)  Hydrocodone-Acetaminophen 10-325 Mg  Tabs (Hydrocodone-Acetaminophen) .[name].. 1 Q 4h As Needed By Mouth 8)  Albuterol Sulfate (2.5 Mg/54ml) 0.083%  Nebu (Albuterol Sulfate) .... Four Times Daily 9)  Vitamin D3 1000 Unit  Tabs (Cholecalciferol) .... One Tablet By Mouth Once Daily 10)  Miralax   Powd (Polyethylene Glycol 3350) .[name].. 1 Dose (17g) Dissolved in Water Two Times A Day 11)  Hydroxyzine Hcl 50 Mg Tabs (Hydroxyzine Hcl) .[name].. 1-2 By Mouth At Bedtime As Needed Insomnia 12)  Neurontin 100 Mg Caps (Gabapentin) .[name].. 1 By Mouth Three Times A Day For Pain 13)  Dulcolax 5 Mg Tbec (Bisacodyl) .... As Needed For Constipation 14)  Hhn .... As Dirr 15)  Zomig Zmt 5 Mg Tbdp (Zolmitriptan) .[name].. 1 By Mouth Once Daily As Needed Migraine  Allergies  (verified): 1)  ! Pcn 2)  ! Oxycontin (Oxycodone Hcl) 3)  ! Lipitor 4)  Morphine 5)  * Oxycodone 6)  * Duragesic/fentanyl Patch  Past History:  Past Medical History: Last updated: 08/26/2009 Lung cancer, hx of COPD Low back pain Osteoarthritis  B knees Seizure disorder Hypothyroidism Hip avasc. necrosis L 2007 Anxiety Depression Cerebrovascular accident, hx of 75 ? Collagenous colitis Hyperlipidemia Esophageal Ulcer 04/2009 EGD Cronic abdominal pain and constipation  Past Surgical History: Last updated: 10/21/2009 Total hip replacement L Appendectomy Hysterectomy, complete 1992 Exploratory Abdominal Surgery Cholecystectomy  Social History: Last updated: 10/07/2009 Occupation: disabled Married 2 childern Current Smoker-1 ppd Regular exercise-no Alcohol Use - no  Review of Systems       The patient complains of difficulty walking and depression.  The patient denies fever, chest pain, syncope, and abdominal pain.         LBP  Physical Exam  General:  Alert, obese woman sitting in a wheelchair Ears:  no external deformities.   Nose:  no external deformity.   Mouth:  Not dry Neck:  supple, no masses, and no thyromegaly.   Lungs:  Patient coughing intermittently with congestion.  Normal respiratory effort with normal breath sounds.  Mild B wheezes. Heart:  normal rate, regular rhythm, no murmur, no gallop, and no rub.   Abdomen:  soft, non-tender, no distention, no masses, and no guarding.   Msk:  Lumbar-sacral spine is tender to palpation over paraspinal muscles and painfull with the ROM     she is in a w/c Neurologic:  Alert and oriented x3.  Mild facial asymmetry with flattening of the right nasolabial fold.  EOM exam reveals reduced abduction of the right eye.  Eye raise symmetrical, smile mildly asymmetrical.  Gross hearing loss in right ear.  CN IX, XI, XII intact.  Strength 5/5 in bilateral upper and lower extremities. Skin:  3 mm growth R lower  eyelid  Psych:    flat affect and subdued.     Impression & Recommendations:  Problem # 1:  FATIGUE (ICD-780.79) multifactorial Assessment Unchanged She can cut back some on pain meds, neurontin  Problem # 2:  SOMNOLENCE (ICD-780.09) multifactorial Assessment: Unchanged  Orders: TLB-BMP (Basic Metabolic Panel-BMET) (80048-METABOL) TLB-CBC Platelet - w/Differential (85025-CBCD) TLB-TSH (Thyroid Stimulating Hormone) (84443-TSH) TLB-Hepatic/Liver Function Pnl (80076-HEPATIC)  Problem # 3:  HYPOTENSION (ICD-458.9) Assessment: Improved  Problem # 4:  CEREBROVASCULAR ACCIDENT, HX OF (ICD-V12.50) Assessment: Improved  Problem # 5:  LOW BACK PAIN (ICD-724.2) Assessment: Comment Only  Her updated medication list for this problem includes:    Hydrocodone-acetaminophen 10-325 Mg Tabs (Hydrocodone-acetaminophen) .[name]... 1 q 4h as needed by mouth  Problem # 6:  COPD (ICD-496) Assessment: Unchanged  Her updated medication list for this problem includes:    Albuterol Sulfate (2.5 Mg/26ml) 0.083% Nebu (Albuterol sulfate) .[name]... Four times daily  Complete Medication List: 1)  Synthroid 150 Mcg Tabs (Levothyroxine sodium) .... Once daily` 2)  Dilantin 100 Mg Caps (Phenytoin sodium extended) .[name].. 100 in am and and 200 in pm 3)  Xanax 2 Mg Tabs (Alprazolam) .... Take one tablet by mouth 2-3 tmes daily as needed for anxiety 4)  Rocaltrol 0.25 Mcg Caps (Calcitriol) .... Once daily 5)  Trazodone Hcl 100 Mg Tabs (Trazodone hcl) .... 2 by mouth at bedtime 6)  Paroxetine Hcl 40 Mg Tabs (Paroxetine hcl) .[name].. 1 by mouth every morning 7)  Hydrocodone-acetaminophen 10-325 Mg Tabs (Hydrocodone-acetaminophen) .[name].. 1 q 4h as needed by mouth 8)  Albuterol Sulfate (2.5 Mg/93ml) 0.083% Nebu (Albuterol sulfate) .... Four times daily 9)  Vitamin D3 1000 Unit Tabs (Cholecalciferol) .... One tablet by mouth once daily 10)  Miralax Powd (Polyethylene glycol 3350) .[name].. 1 dose (17g) dissolved in water two times a  day 11)  Hydroxyzine Hcl 50 Mg Tabs (Hydroxyzine hcl) .[name].. 1-2 by mouth at bedtime as needed insomnia 12)  Neurontin 100 Mg Caps (Gabapentin) .[name].. 1 by mouth three times a day for pain 13)  Dulcolax 5 Mg Tbec (Bisacodyl) .... As needed for constipation 14)  Hhn  .... As dirr 15)  Zomig Zmt 5 Mg Tbdp (Zolmitriptan) .[name].. 1 by mouth once daily as needed migraine  Other Orders: TLB-Phenytoin (Dilantin) (80185-DILAN)  Patient Instructions: 1)  Please schedule a follow-up appointment in 4 months.

## 2010-07-26 NOTE — Assessment & Plan Note (Signed)
Summary: 2 MTH FU STC   Vital Signs:  Patient profile:   55 year old female Weight:      171 pounds Temp:     98.3 degrees F oral Pulse rate:   90 / minute BP sitting:   84 / 40  (left arm)  Vitals Entered By: Tora Perches (December 10, 2008 10:35 AM) CC: f/u Is Patient Diabetic? No   Primary Care Provider:  Tresa Garter MD  CC:  f/u.  History of Present Illness: The patient presents for a follow up of back pain, anxiety, depression and headaches, low BP, seizures. C/o being sleepy.   Current Medications (verified): 1)  Synthroid 150 Mcg  Tabs (Levothyroxine Sodium) .... Once Daily` 2)  Dilantin 100 Mg  Caps (Phenytoin Sodium Extended) .[name].. 100 in Am and and 200 in Pm 3)  Xanax 2 Mg  Tabs (Alprazolam) .... Take One Tablet By Mouth 2-3 Tmes Daily As Needed For Anxiety 4)  Rocaltrol 0.25 Mcg  Caps (Calcitriol) .... Once Daily 5)  Trazodone Hcl 100 Mg Tabs (Trazodone Hcl) .... 2 By Mouth Qhs 6)  Paroxetine Hcl 40 Mg  Tabs (Paroxetine Hcl) .[name].. 1 By Mouth Qam 7)  Hydrocodone-Acetaminophen 10-325 Mg  Tabs (Hydrocodone-Acetaminophen) .[name].. 1 Q 4h As Needed By Mouth 8)  Albuterol Sulfate (2.5 Mg/20ml) 0.083%  Nebu (Albuterol Sulfate) .... Qid 9)  Vitamin D3 1000 Unit  Tabs (Cholecalciferol) .[name].. 1 Qd 10)  Enulose 10 Gm/9ml Soln (Lactulose Encephalopathy) .... 30-60 Cc By Mouth Qd 11)  Hydroxyzine Hcl 50 Mg Tabs (Hydroxyzine Hcl) .[name].. 1-2 By Mouth At Bedtime As Needed Insomnia 12)  Neurontin 100 Mg Caps (Gabapentin) .[name].. 1 By Mouth Three Times A Day For Pain 13)  Fludrocortisone Acetate 0.1 Mg Tabs (Fludrocortisone Acetate) .[name].. 1 By Mouth Two Times A Day in Am and Lunch 14)  Imitrex 100 Mg Tabs (Sumatriptan Succinate) .[name].. 1 By Mouth At The Onset of Migraine. May Repeat Dose in One Hour If Headache Not Resolved  Allergies: 1)  ! Pcn 2)  ! Oxycontin (Oxycodone Hcl) 3)  ! Lipitor 4)  Morphine 5)  * Oxycodone 6)  * Duragesic  Past History:  Past Medical History: Last  updated: 10/01/2008 Lung cancer, hx of COPD Low back pain Osteoarthritis  B knees Seizure disorder Hypothyroidism Hip avasc. necrosis L 2007 Anxiety Depression Cerebrovascular accident, hx of 89 Colitis 88 Hyperlipidemia  Past Surgical History: Last updated: 03/27/2007 Total hip replacement L  Family History: Last updated: 05/08/2007 Family History of CAD Female 1st degree relative <60 Family History Diabetes 1st degree relative  Social History: Last updated: 05/08/2007 Occupation: disabled Married Current Smoker Regular exercise-no  Physical Exam  General:  less sleepy and sedated Nose:  WNL Mouth:  Not dry Lungs:  Normal respiratory effort, chest expands symmetrically. Lungs are clear to auscultation, no crackles or wheezes. Heart:  Normal rate and regular rhythm. S1 and S2 normal without gallop, murmur, click, rub or other extra sounds. Abdomen:  Bowel sounds positive,abdomen soft and non-tender without masses, organomegaly or hernias noted. Msk:  NT spine, w/c Neurologic:  alert , not confused, able to talk.   Skin:  no rashes.   Psych:    flat affect and subdued.     Impression & Recommendations:  Problem # 1:  HYPOTENSION (ICD-458.9) Assessment Improved Fludro to increase to 2 in am 1 at lunch: should help fatigue  Problem # 2:  CONSTIPATION (ICD-564.00) Assessment: Unchanged New diet  Problem # 3:  VITAMIN  D DEFICIENCY (ICD-268.9) On prescription therapy   Problem # 4:  SEIZURE DISORDER (ICD-780.39) Assessment: Improved  Her updated medication list for this problem includes:    Dilantin 100 Mg Caps (Phenytoin sodium extended) .Marland KitchenMarland KitchenMarland KitchenMarland Kitchen 100 in am and and 200 in pm    Neurontin 100 Mg Caps (Gabapentin) .[name]... 1 by mouth three times a day for pain  Problem # 7:  FATIGUE (ICD-780.79) multifactorial Assessment: Unchanged  Complete Medication List: 1)  Synthroid 150 Mcg Tabs (Levothyroxine sodium) .... Once daily` 2)  Dilantin 100 Mg Caps (Phenytoin  sodium extended) .[name].. 100 in am and and 200 in pm 3)  Xanax 2 Mg Tabs (Alprazolam) .... Take one tablet by mouth 2-3 tmes daily as needed for anxiety 4)  Rocaltrol 0.25 Mcg Caps (Calcitriol) .... Once daily 5)  Trazodone Hcl 100 Mg Tabs (Trazodone hcl) .... 2 by mouth qhs 6)  Paroxetine Hcl 40 Mg Tabs (Paroxetine hcl) .[name].. 1 by mouth qam 7)  Hydrocodone-acetaminophen 10-325 Mg Tabs (Hydrocodone-acetaminophen) .[name].. 1 q 4h as needed by mouth 8)  Albuterol Sulfate (2.5 Mg/11ml) 0.083% Nebu (Albuterol sulfate) .... Qid 9)  Vitamin D3 1000 Unit Tabs (Cholecalciferol) .[name].. 1 qd 10)  Enulose 10 Gm/54ml Soln (Lactulose encephalopathy) .... 30-60 cc by mouth qd 11)  Hydroxyzine Hcl 50 Mg Tabs (Hydroxyzine hcl) .[name].. 1-2 by mouth at bedtime as needed insomnia 12)  Neurontin 100 Mg Caps (Gabapentin) .[name].. 1 by mouth three times a day for pain 13)  Fludrocortisone Acetate 0.1 Mg Tabs (Fludrocortisone acetate) .... By mouth two times a day 2  in am and 1  lunch 14)  Imitrex 100 Mg Tabs (Sumatriptan succinate) .[name].. 1 by mouth at the onset of migraine. may repeat dose in one hour if headache not resolved  Patient Instructions: 1)  Please schedule a follow-up appointment in 2 months. 2)  BMP prior to visit, ICD-9: 3)  dilantin level 995.20 4)  [email or web address] Prescriptions: TRAZODONE HCL 100 MG TABS (TRAZODONE HCL) 2 by mouth qhs  #60 x 12   Entered and Authorized by:   Tresa Garter MD   Signed by:   Tresa Garter MD on 12/10/2008   Method used:   Electronically to        [facility] (retail)       [address]       [place], [place]  [address]       Ph: [phone]       Fax: [phone]   RxID:   [id number] HYDROCODONE-ACETAMINOPHEN 10-325 MG  TABS (HYDROCODONE-ACETAMINOPHEN) 1 q 4h as needed by mouth  #120 x 6   Entered and Authorized by:   Tresa Garter MD   Signed by:   Tresa Garter MD on 12/10/2008   Method used:    Print then Give to Patient   RxID:   [id number] FLUDROCORTISONE ACETATE 0.1 MG TABS (FLUDROCORTISONE ACETATE) by mouth two times a day 2  in AM and 1  lunch  #90 x 6   Entered and Authorized by:   Tresa Garter MD   Signed by:   Tresa Garter MD on 12/10/2008   Method used:   Print then Give to Patient   RxID:   [id number]

## 2010-07-26 NOTE — Medication Information (Signed)
Summary: AMBIEN prior auth/[facility]  AMBIEN prior auth/[facility]   Imported By: Lester McFarland 07/15/2008 11:22:08  _____________________________________________________________________  External Attachment:    Type:   Image     Comment:   External Document

## 2010-07-26 NOTE — Assessment & Plan Note (Signed)
Summary: follow up EGD/sheri   History of Present Illness Visit Type: Follow-up Visit Primary GI MD: Stan Head MD [facility] Primary Provider: Georgina Quint Plotnikov MD Requesting Provider: n/a Chief Complaint: epigastric pain History of Present Illness:   55 yo white woman with chronic abdominal pain with EGD 06/2009 to follow-up esophageal ulcer and evaluate abdominal pain. Completed Pylera for H. pylori gastritis. still having epigastric pain and abdominal distention and firmness after meals. Also with intermittent LLQ pain. Her bowels are moveing several times a week with MiraLax and she thinks that is sig better. She does vomit after the pain and distention at times. Husband here and provides some history.   GI Review of Systems    Reports abdominal pain.     Location of  Abdominal pain: epigastric area.    Denies acid reflux, belching, bloating, chest pain, dysphagia with liquids, dysphagia with solids, heartburn, loss of appetite, nausea, vomiting, vomiting blood, weight loss, and  weight gain.        Denies anal fissure, black tarry stools, change in bowel habit, constipation, diarrhea, diverticulosis, fecal incontinence, heme positive stool, hemorrhoids, irritable bowel syndrome, jaundice, light color stool, liver problems, rectal bleeding, and  rectal pain.    Current Medications (verified): 1)  Synthroid 150 Mcg  Tabs (Levothyroxine Sodium) .... Once Daily` 2)  Dilantin 100 Mg  Caps (Phenytoin Sodium Extended) .[name].. 100 in Am and and 200 in Pm 3)  Xanax 2 Mg  Tabs (Alprazolam) .... Take One Tablet By Mouth 2-3 Tmes Daily As Needed For Anxiety 4)  Rocaltrol 0.25 Mcg  Caps (Calcitriol) .... Once Daily 5)  Trazodone Hcl 100 Mg Tabs (Trazodone Hcl) .... 2 By Mouth At Bedtime 6)  Paroxetine Hcl 40 Mg  Tabs (Paroxetine Hcl) .[name].. 1 By Mouth Every Morning 7)  Hydrocodone-Acetaminophen 10-325 Mg  Tabs (Hydrocodone-Acetaminophen) .[name].. 1 Q 4h As Needed By Mouth 8)  Albuterol Sulfate (2.5  Mg/36ml) 0.083%  Nebu (Albuterol Sulfate) .... Four Times Daily 9)  Vitamin D3 1000 Unit  Tabs (Cholecalciferol) .... One Tablet By Mouth Once Daily 10)  Miralax   Powd (Polyethylene Glycol 3350) .[name].. 1 Dose (17g) Dissolved in Water Two Times A Day 11)  Hydroxyzine Hcl 50 Mg Tabs (Hydroxyzine Hcl) .[name].. 1-2 By Mouth At Bedtime As Needed Insomnia 12)  Neurontin 100 Mg Caps (Gabapentin) .[name].. 1 By Mouth Three Times A Day For Pain 13)  Imitrex 100 Mg Tabs (Sumatriptan Succinate) .[name].. 1 By Mouth At The Onset of Migraine. May Repeat Dose in One Hour If Headache Not Resolved 14)  Dulcolax 5 Mg Tbec (Bisacodyl) .... As Needed For Constipation 15)  Hhn .... As Dirr  Allergies (verified): 1)  ! Pcn 2)  ! Oxycontin (Oxycodone Hcl) 3)  ! Lipitor 4)  Morphine 5)  * Oxycodone 6)  * Duragesic/fentanyl Patch  Past History:  Past Medical History: Lung cancer, hx of COPD Low back pain Osteoarthritis  B knees Seizure disorder Hypothyroidism Hip avasc. necrosis L 2007 Anxiety Depression Cerebrovascular accident, hx of 49 ? Collagenous colitis Hyperlipidemia Esophageal Ulcer 04/2009 EGD Cronic abdominal pain and constipation  Past Surgical History: Reviewed history from 06/11/2009 and no changes required. Total hip replacement L Appendectomy Hysterectomy  Family History: Reviewed history from 06/11/2009 and no changes required. Family History of CAD Female 1st degree relative <60 Family History Diabetes 1st degree relative No FH of Colon Cancer:  Social History: Reviewed history from 06/11/2009 and no changes required. Occupation: disabled Married 2 childern Current Smoker Regular  exercise-no Alcohol Use - no  Review of Systems       limied mobility due to left hip and knee problems and stroke   Vital Signs:  Patient profile:   55 year old female Height:      65 inches Weight:      177 pounds BMI:     29.56 BSA:     1.88 Pulse rate:   88 / minute Pulse rhythm:    regular BP sitting:   98 / 60  (left arm) Cuff size:   regular  Vitals Entered By: Ok Anis CMA (August 26, 2009 2:10 PM)  Physical Exam  General:  Chronically ill-appearing  Eyes:  anicteric Lungs:  Clear throughout to auscultation. Heart:  Regular rate and rhythm; no murmurs, rubs,  or bruits. Abdomen:  softm mildly tender llq Rectal:  deferred until time of colonoscopy.   Neurologic:  awake and alert Psych:  mildly flat affect   Impression & Recommendations:  Problem # 1:  EPIGASTRIC PAIN (ICD-789.06) CT abd/pelvis 11/09 unrevealing (reviewed) CT pelvis 12/10 without GI pathology (evaluation for L hip pain) [place] 2008 performed for epigastric pain (unrevealing) I think this and distention related to neuro toxicity of prior chemotherapy as these sxs go back to 2001 and before. not alot of vomitng and given possible problems with metacloprmide will try hyoscyamine 0.125 mg  Problem # 2:  CONSTIPATION, CHRONIC (ICD-564.09) Assessment: Improved take MiraLax every day - her husband has encouraged this but she has not done so  Problem # 3:  COLONIC POLYPS, ADENOMATOUS, HX OF (ICD-V12.72) Risks, benefits,and indications of endoscopic procedure(s) were reviewed with the patient and all questions answered. Needs screening/surveillance  Problem # 4:  Hx of COLLAGENOUS COLITIS (ICD-710.9) Assessment: Comment Only Maybe, focal possible changes seen on colonoscopy biopsy in past if she did have this she certainly does not have sxs or signs now I tried to explain this as they often spoke of "her colitis"  Patient Instructions: 1)  Please pick up your medications at your pharmacy, to start hyoscyamine. 2)  Take MiraLax every day. Adjust for effect and reduce amount or skip a day or 2 if you get diarrhea. 3)  Call us back to schedule your colonoscopy. 4)  Please schedule a follow-up appointment in 6 to 8 weeks.  5)  The medication list was reviewed and reconciled.  All changed / newly  prescribed medications were explained.  A complete medication list was provided to the patient / caregiver. Prescriptions: HYOSCYAMINE SULFATE 0.125 MG SUBL (HYOSCYAMINE SULFATE) 1-2 under tongue every 4 hours as needed for abdomnal pain  #120 x 1   Entered and Authorized by:   Iva Boop MD, [facility]   Signed by:   Iva Boop MD, FACG on 08/26/2009   Method used:   Electronically to        [facility] (retail)       [address]       [place], [place]  [address]       Ph: [phone]       Fax: [phone]   RxID:   [id number]

## 2010-07-26 NOTE — Assessment & Plan Note (Signed)
Summary: PER SPOUSE 2 MTH FU   $50   STC   Vital Signs:  Patient Profile:   55 Years Old Female Weight:      167 pounds Temp:     97.6 degrees F oral Pulse rate:   72 / minute BP sitting:   100 / 40  (left arm)  Vitals Entered By: Tora Perches (July 30, 2008 10:41 AM)                 Chief Complaint:  Multiple medical problems or concerns.  History of Present Illness: The patient presents for a follow up of back pain, anxiety, depression and headaches. C/o bad insomnia. Constipation is better. C/o burning w/peeing    Prior Medications Reviewed Using: List Brought by Patient  Prior Medication List:  SYNTHROID 150 MCG  TABS (LEVOTHYROXINE SODIUM) once daily` [BMN] DILANTIN 100 MG  CAPS (PHENYTOIN SODIUM EXTENDED) 1 by mouth bid XANAX 2 MG  TABS (ALPRAZOLAM) Take one tablet by mouth 2-3 tmes daily as needed for anxiety AMBIEN 10 MG  TABS (ZOLPIDEM TARTRATE) Take one tablet by mouth at bedtime for insomnia ROCALTROL 0.25 MCG  CAPS (CALCITRIOL) once daily TRAZODONE HCL 100 MG TABS (TRAZODONE HCL)  PAROXETINE HCL 40 MG  TABS (PAROXETINE HCL) 1 by mouth qam HYDROCODONE-ACETAMINOPHEN 10-325 MG  TABS (HYDROCODONE-ACETAMINOPHEN) 1 q 4h as needed by mouth ALBUTEROL SULFATE (2.5 MG/3ML) 0.083%  NEBU (ALBUTEROL SULFATE) qid VITAMIN D3 1000 UNIT  TABS (CHOLECALCIFEROL) 1 qd ENULOSE 10 GM/15ML SOLN (LACTULOSE ENCEPHALOPATHY) 30-60 cc by mouth qd   Current Allergies (reviewed today): ! PCN ! OXYCONTIN (OXYCODONE HCL) ! LIPITOR MORPHINE * OXYCODONE * DURAGESIC  Past Medical History:    Reviewed history from 07/02/2008 and no changes required:       Lung cancer, hx of       COPD       Low back pain       Osteoarthritis  B knees       Seizure disorder       Hypothyroidism       Hip avasc. necrosis L 2007       Anxiety       Depression       Cerebrovascular accident, hx of 34       Colitis 68   Family History:    Reviewed history from 05/08/2007 and no changes  required:       Family History of CAD Female 1st degree relative <60       Family History Diabetes 1st degree relative  Social History:    Reviewed history from 05/08/2007 and no changes required:       Occupation: disabled       Married       Current Smoker       Regular exercise-no     Physical Exam  General:     Chronically ill-appearing NAD Nose:     External nasal examination shows no deformity or inflammation. Nasal mucosa are pink and moist without lesions or exudates. Mouth:     Not dry Lungs:     CTA Heart:     RRR 2/6 systolic heart murmur  Abdomen:     Less tender in upper 1/2 B, no masses, no rigidity, and no rebound tenderness.   Msk:     Cane Lumbar-sacral spine is tender to palpation over paraspinal muscles and painfull with the ROM Can't walk w/o support  Neurologic:     alert & oriented X3.  Weak LE B Skin:     no rashes.   Psych:     Oriented X3 and normally interactive.      Impression & Recommendations:  Problem # 1:  VITAMIN D DEFICIENCY (ICD-268.9) Assessment: Comment Only On prescription therapy   Problem # 2:  LOW BACK PAIN (ICD-724.2) Assessment: Deteriorated Start Neurontin Her updated medication list for this problem includes:    Hydrocodone-acetaminophen 10-325 Mg Tabs (Hydrocodone-acetaminophen) .[name]... 1 q 4h as needed by mouth   Problem # 3:  INSOMNIA, PERSISTENT (ICD-307.42) Assessment: Deteriorated Change to Hydroxyzine  Problem # 4:  DEPRESSION (ICD-311) Assessment: Unchanged  Her updated medication list for this problem includes:    Xanax 2 Mg Tabs (Alprazolam) .[name]... Take one tablet by mouth 2-3 tmes daily as needed for anxiety    Trazodone Hcl 100 Mg Tabs (Trazodone hcl) .[name]... 2 by mouth qhs    Paroxetine Hcl 40 Mg Tabs (Paroxetine hcl) .[name]... 1 by mouth qam    Hydroxyzine Hcl 50 Mg Tabs (Hydroxyzine hcl) .[name]... 1-2 by mouth at bedtime as needed insomnia   Problem # 5:  ACUTE CYSTITIS (ICD-595.0) poss    Assessment: New  Her updated medication list for this problem includes:    Cipro 500 Mg Tabs (Ciprofloxacin hcl) .[name]... 1 by mouth 2 times daily  Orders: TLB-Udip ONLY (81003-UDIP)   Problem # 6:  CONSTIPATION (ICD-564.00) Assessment: Improved On prescription therapy   Complete Medication List: 1)  Synthroid 150 Mcg Tabs (Levothyroxine sodium) .... Once daily` 2)  Dilantin 100 Mg Caps (Phenytoin sodium extended) .[name].. 1 by mouth bid 3)  Xanax 2 Mg Tabs (Alprazolam) .... Take one tablet by mouth 2-3 tmes daily as needed for anxiety 4)  Rocaltrol 0.25 Mcg Caps (Calcitriol) .... Once daily 5)  Trazodone Hcl 100 Mg Tabs (Trazodone hcl) .... 2 by mouth qhs 6)  Paroxetine Hcl 40 Mg Tabs (Paroxetine hcl) .[name].. 1 by mouth qam 7)  Hydrocodone-acetaminophen 10-325 Mg Tabs (Hydrocodone-acetaminophen) .[name].. 1 q 4h as needed by mouth 8)  Albuterol Sulfate (2.5 Mg/74ml) 0.083% Nebu (Albuterol sulfate) .... Qid 9)  Vitamin D3 1000 Unit Tabs (Cholecalciferol) .[name].. 1 qd 10)  Enulose 10 Gm/35ml Soln (Lactulose encephalopathy) .... 30-60 cc by mouth qd 11)  Hydroxyzine Hcl 50 Mg Tabs (Hydroxyzine hcl) .[name].. 1-2 by mouth at bedtime as needed insomnia 12)  Neurontin 100 Mg Caps (Gabapentin) .[name].. 1 by mouth three times a day for pain 13)  Cipro 500 Mg Tabs (Ciprofloxacin hcl) .[name].. 1 by mouth 2 times daily   Patient Instructions: 1)  Please schedule a follow-up appointment in 2 months. 2)  BMP prior to visit, ICD-9: 3)  Hepatic Panel prior to visit, ICD-9: 4)  Lipid Panel prior to visit, ICD-9: 5)  TSH prior to visit, ICD-9: 995.20 6)  Urine-dip prior to visit, ICD-9:595.0 7)  dilantin   Prescriptions: CIPRO 500 MG TABS (CIPROFLOXACIN HCL) 1 by mouth 2 times daily  #14 x 0   Entered and Authorized by:   Tresa Garter MD   Signed by:   Tresa Garter MD on 07/30/2008   Method used:   Print then Give to Patient   RxID:   [id number] NEURONTIN 100 MG CAPS (GABAPENTIN) 1 by mouth three  times a day for pain  #90 x 6   Entered and Authorized by:   Tresa Garter MD   Signed by:   Tresa Garter MD on 07/30/2008   Method used:   Print then Give  to Patient   RxID:   [id number] HYDROXYZINE HCL 50 MG TABS (HYDROXYZINE HCL) 1-2 by mouth at bedtime as needed insomnia  #60 x 6   Entered and Authorized by:   Tresa Garter MD   Signed by:   Tresa Garter MD on 07/30/2008   Method used:   Print then Give to Patient   RxID:   [id number] TRAZODONE HCL 100 MG TABS (TRAZODONE HCL) 2 by mouth qhs  #60 x 12   Entered and Authorized by:   Tresa Garter MD   Signed by:   Tresa Garter MD on 07/30/2008   Method used:   Print then Give to Patient   RxID:   [id number]

## 2010-07-26 NOTE — Assessment & Plan Note (Signed)
Summary: 2 MO ROV /NWS   Vital Signs:  Patient profile:   55 year old female Height:      65 inches (165.10 cm) Weight:      176.50 pounds (80.23 kg) BMI:     29.48 O2 Sat:      95 % on Room air Temp:     98.0 degrees F (36.67 degrees C) oral Pulse rate:   76 / minute Pulse rhythm:   regular BP sitting:   98 / 42  (left arm) Cuff size:   regular  Vitals Entered By: Brenton Grills (October 21, 2009 8:29 AM)  O2 Flow:  Room air CC: 2 mo f/u visit/aj   Primary Care Provider:  Sonda Primes, MD  CC:  2 mo f/u visit/aj.  History of Present Illness: The patient presents for a follow up of back pain, anxiety, depression and headaches.   Current Medications (verified): 1)  Synthroid 150 Mcg  Tabs (Levothyroxine Sodium) .... Once Daily` 2)  Dilantin 100 Mg  Caps (Phenytoin Sodium Extended) .[name].. 100 in Am and and 200 in Pm 3)  Xanax 2 Mg  Tabs (Alprazolam) .... Take One Tablet By Mouth 2-3 Tmes Daily As Needed For Anxiety 4)  Rocaltrol 0.25 Mcg  Caps (Calcitriol) .... Once Daily 5)  Trazodone Hcl 100 Mg Tabs (Trazodone Hcl) .... 2 By Mouth At Bedtime 6)  Paroxetine Hcl 40 Mg  Tabs (Paroxetine Hcl) .[name].. 1 By Mouth Every Morning 7)  Hydrocodone-Acetaminophen 10-325 Mg  Tabs (Hydrocodone-Acetaminophen) .[name].. 1 Q 4h As Needed By Mouth 8)  Albuterol Sulfate (2.5 Mg/62ml) 0.083%  Nebu (Albuterol Sulfate) .... Four Times Daily 9)  Vitamin D3 1000 Unit  Tabs (Cholecalciferol) .... One Tablet By Mouth Once Daily 10)  Miralax   Powd (Polyethylene Glycol 3350) .[name].. 1 Dose (17g) Dissolved in Water Two Times A Day 11)  Hydroxyzine Hcl 50 Mg Tabs (Hydroxyzine Hcl) .[name].. 1-2 By Mouth At Bedtime As Needed Insomnia 12)  Neurontin 100 Mg Caps (Gabapentin) .[name].. 1 By Mouth Three Times A Day For Pain 13)  Imitrex 100 Mg Tabs (Sumatriptan Succinate) .[name].. 1 By Mouth At The Onset of Migraine. May Repeat Dose in One Hour If Headache Not Resolved 14)  Dulcolax 5 Mg Tbec (Bisacodyl) .... As Needed For  Constipation 15)  Hhn .... As Dirr 16)  Moviprep 100 Gm  Solr (Peg-Kcl-Nacl-Nasulf-Na Asc-C) .... As Per Prep Instructions.  Allergies (verified): 1)  ! Pcn 2)  ! Oxycontin (Oxycodone Hcl) 3)  ! Lipitor 4)  Morphine 5)  * Oxycodone 6)  * Duragesic/fentanyl Patch  Past History:  Past Medical History: Last updated: 08/26/2009 Lung cancer, hx of COPD Low back pain Osteoarthritis  B knees Seizure disorder Hypothyroidism Hip avasc. necrosis L 2007 Anxiety Depression Cerebrovascular accident, hx of 3 ? Collagenous colitis Hyperlipidemia Esophageal Ulcer 04/2009 EGD Cronic abdominal pain and constipation  Social History: Last updated: 10/07/2009 Occupation: disabled Married 2 childern Current Smoker-1 ppd Regular exercise-no Alcohol Use - no  Past Surgical History: Total hip replacement L Appendectomy Hysterectomy, complete 1992 Exploratory Abdominal Surgery Cholecystectomy  Review of Systems  The patient denies fever, dyspnea on exertion, and abdominal pain.         LBP  Physical Exam  General:  less sleepy and sedated Chronically ill-appearing  Looks better today Nose:  WNL Mouth:  Not dry Neck:  No deformities, masses, or tenderness noted. Lungs:  Normal respiratory effort, chest expands symmetrically. Lungs with wheezes. Heart:  Normal rate and regular  rhythm. S1 and S2 normal without gallop, murmur, click, rub or other extra sounds. Abdomen:  Bowel sounds positive,abdomen soft and non-tender without masses, organomegaly or hernias noted. Msk:  NT spine,  she is in a w/c Extremities:  trace left pedal edema and trace right pedal edema.   Neurologic:  alert , not confused, able to talk much better.   Skin:  3 mm growth R lower eyelid 7x4 mole under R breast Psych:    flat affect and subdued.     Impression & Recommendations:  Problem # 1:  HYPOTENSION (ICD-458.9) Assessment Improved  Problem # 2:  FATIGUE (ICD-780.79) Assessment:  Unchanged  Problem # 3:  HYPERLIPIDEMIA (ICD-272.4) Assessment: Comment Only  Problem # 4:  CEREBROVASCULAR ACCIDENT, HX OF (ICD-V12.50) Assessment: Comment Only On prescription/OTC  therapy   Problem # 5:  DEPRESSION (ICD-311) Assessment: Unchanged  Her updated medication list for this problem includes:    Xanax 2 Mg Tabs (Alprazolam) .[name]... Take one tablet by mouth 2-3 tmes daily as needed for anxiety    Trazodone Hcl 100 Mg Tabs (Trazodone hcl) .[name]... 2 by mouth at bedtime    Paroxetine Hcl 40 Mg Tabs (Paroxetine hcl) .[name]... 1 by mouth every morning    Hydroxyzine Hcl 50 Mg Tabs (Hydroxyzine hcl) .[name]... 1-2 by mouth at bedtime as needed insomnia  Problem # 6:  MIGRAINE HEADACHE (ICD-346.90) Assessment: Unchanged  Her updated medication list for this problem includes:    Hydrocodone-acetaminophen 10-325 Mg Tabs (Hydrocodone-acetaminophen) .[name]... 1 q 4h as needed by mouth    Imitrex 100 Mg Tabs (Sumatriptan succinate) .[name]... 1 by mouth at the onset of migraine. may repeat dose in one hour if headache not resolved please fill -  medically necessary  Problem # 7:  GAIT DISTURBANCE (ICD-781.2) Assessment: Unchanged Walker at home; w/c when out  Problem # 8:  COPD (ICD-496) Assessment: Unchanged  Her updated medication list for this problem includes:    Albuterol Sulfate (2.5 Mg/37ml) 0.083% Nebu (Albuterol sulfate) .[name]... Four times daily  Complete Medication List: 1)  Synthroid 150 Mcg Tabs (Levothyroxine sodium) .... Once daily` 2)  Dilantin 100 Mg Caps (Phenytoin sodium extended) .[name].. 100 in am and and 200 in pm 3)  Xanax 2 Mg Tabs (Alprazolam) .... Take one tablet by mouth 2-3 tmes daily as needed for anxiety 4)  Rocaltrol 0.25 Mcg Caps (Calcitriol) .... Once daily 5)  Trazodone Hcl 100 Mg Tabs (Trazodone hcl) .... 2 by mouth at bedtime 6)  Paroxetine Hcl 40 Mg Tabs (Paroxetine hcl) .[name].. 1 by mouth every morning 7)  Hydrocodone-acetaminophen 10-325 Mg Tabs  (Hydrocodone-acetaminophen) .[name].. 1 q 4h as needed by mouth 8)  Albuterol Sulfate (2.5 Mg/31ml) 0.083% Nebu (Albuterol sulfate) .... Four times daily 9)  Vitamin D3 1000 Unit Tabs (Cholecalciferol) .... One tablet by mouth once daily 10)  Miralax Powd (Polyethylene glycol 3350) .[name].. 1 dose (17g) dissolved in water two times a day 11)  Hydroxyzine Hcl 50 Mg Tabs (Hydroxyzine hcl) .[name].. 1-2 by mouth at bedtime as needed insomnia 12)  Neurontin 100 Mg Caps (Gabapentin) .[name].. 1 by mouth three times a day for pain 13)  Imitrex 100 Mg Tabs (Sumatriptan succinate) .[name].. 1 by mouth at the onset of migraine. may repeat dose in one hour if headache not resolved please fill -  medically necessary 14)  Dulcolax 5 Mg Tbec (Bisacodyl) .... As needed for constipation 15)  Hhn  .... As dirr 16)  Moviprep 100 Gm Solr (Peg-kcl-nacl-nasulf-na asc-c) .... As  per prep instructions.  Patient Instructions: 1)  Please schedule a follow-up appointment in 3 months. Prescriptions: IMITREX 100 MG TABS (SUMATRIPTAN SUCCINATE) 1 by mouth at the onset of migraine. May repeat dose in one hour if headache not resolved PLEASE FILL -  MEDICALLY NECESSARY  #12 x 6   Entered and Authorized by:   Tresa Garter MD   Signed by:   Tresa Garter MD on 10/21/2009   Method used:   Electronically to        [facility]  [address] [phone]* (retail)       [address]       [place], [place]  [address]       Ph: [phone] or [phone]       Fax: [phone]   RxID:   [id number]

## 2010-07-26 NOTE — Progress Notes (Signed)
Summary: Hydrocodone  Phone Note Call from Patient Call back at [phone]   Summary of Call: Patient is requesting refill of hydrocodone. Last written and given to pt was in october for #120 w/5 refills. Per pt's husband, pt shredded last rx given. OK to fill?  Initial call taken by: Lamar Sprinkles, CMA,  June 11, 2009 4:28 PM  Follow-up for Phone Call        OK Thx Follow-up by: Tresa Garter MD,  June 11, 2009 5:33 PM  Additional Follow-up for Phone Call Additional follow up Details #1::        Pt informed  Additional Follow-up by: Lamar Sprinkles, CMA,  June 11, 2009 5:39 PM    Prescriptions: HYDROCODONE-ACETAMINOPHEN 10-325 MG  TABS (HYDROCODONE-ACETAMINOPHEN) 1 q 4h as needed by mouth  #120 x 5   Entered by:   Lamar Sprinkles, CMA   Authorized by:   Tresa Garter MD   Signed by:   Lamar Sprinkles, CMA on 06/11/2009   Method used:   Telephoned to ...       [facility]  [address]* (retail)       [address]       [place], [place]  [address]       Ph: [phone]       Fax: [phone]   RxID:   [id number]

## 2010-07-26 NOTE — Progress Notes (Signed)
Summary: U/A  Phone Note From Other Clinic   Summary of Call: Pt came into lab this am, she is also req u/a b/c she thinks she has uti, ok per dr to add u/a. Gave verbal to lab Initial call taken by: Lamar Sprinkles,  February 04, 2009 10:25 AM  Follow-up for Phone Call        OK UA Thx Follow-up by: Tresa Garter MD,  February 04, 2009 12:58 PM  Additional Follow-up for Phone Call Additional follow up Details #1::        Lab ready in EMR, please advise about U/A results..............Marland KitchenLamar Sprinkles  February 04, 2009 1:56 PM     Additional Follow-up for Phone Call Additional follow up Details #2::    Pls see lab addendum Follow-up by: Tresa Garter MD,  February 05, 2009 7:44 AM  Additional Follow-up for Phone Call Additional follow up Details #3:: Details for Additional Follow-up Action Taken: Pt notified UA normal Additional Follow-up by: Orlan Leavens,  February 05, 2009 11:23 AM

## 2010-07-26 NOTE — Assessment & Plan Note (Signed)
Summary: 2 MTH FU  $50---STC   Vital Signs:  Patient profile:   55 year old female Height:      65 inches Weight:      165 pounds BMI:     27.56 Temp:     98 degrees F oral Pulse rate:   88 / minute BP sitting:   78 / 40  (left arm)  Vitals Entered By: Tora Perches (October 01, 2008 10:38 AM) CC: weekness and migranes Is Patient Diabetic? No   Visit Type:  2 month follow up Primary Care Provider:  Tresa Garter MD  CC:  weekness and migranes.  History of Present Illness: The patient is here for a follow up,she appears very sleepy, she does not feel she is improving too well from her stroke,she complains of weekness and constant migrane headaches.   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) 2)  Insomnia, Persistent  (ICD-307.42) 3)  Cerebrovascular Accident, Hx of  (ICD-V12.50) 4)  Gait Disturbance  (ICD-781.2) 5)  Depression  (ICD-311) 6)  Anxiety  (ICD-300.00) 7)  Constipation  (ICD-564.00) 8)  Bronchitis, Acute  (ICD-466.0) 9)  Abdominal Pain, Unspecified Site  (ICD-789.00) 10)  Acute Cystitis  (ICD-595.0) 11)  Essential Hypertension, Benign  (ICD-401.1) 12)  Hypothyroidism  (ICD-244.9) 13)  Osteoarthritis  (ICD-715.90) 14)  Low Back Pain  (ICD-724.2) 15)  COPD  (ICD-496) 16)  Lung Cancer, Hx of  (ICD-V10.11) 17)  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) 18)  Colitis  (ICD-558.9) 19)  Vitamin D Deficiency  (ICD-268.9) 20)  Pyelonephritis  (ICD-590.80) 21)  Seizure Disorder  (ICD-780.39) 22)  Neoplasm, Malignant, Lung, 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 Once Daily` 2)  Dilantin 100 Mg  Caps (Phenytoin Sodium Extended) .[name].. 1 By Mouth Bid 3)  Xanax 2 Mg  Tabs (Alprazolam) .... Take One Tablet By Mouth 2-3 Tmes Daily As Needed For Anxiety 4)  Rocaltrol 0.25 Mcg  Caps (Calcitriol) .... Once Daily 5)  Trazodone Hcl 100 Mg Tabs (Trazodone Hcl) .... 2 By Mouth Qhs 6)  Paroxetine Hcl 40 Mg  Tabs (Paroxetine Hcl) .[name].. 1 By Mouth Qam 7)  Hydrocodone-Acetaminophen 10-325 Mg  Tabs (Hydrocodone-Acetaminophen) .[name].. 1 Q 4h As Needed By Mouth 8)  Albuterol Sulfate (2.5 Mg/52ml) 0.083%  Nebu (Albuterol Sulfate) .... Qid 9)  Vitamin D3 1000 Unit  Tabs (Cholecalciferol) .[name].. 1 Qd 10)  Enulose 10 Gm/71ml Soln (Lactulose Encephalopathy) .... 30-60 Cc By Mouth Qd 11)  Hydroxyzine Hcl 50 Mg Tabs (Hydroxyzine Hcl) .[name].. 1-2 By Mouth At Bedtime As Needed Insomnia 12)  Neurontin 100 Mg Caps (Gabapentin) .[name].. 1 By Mouth Three Times A Day For Pain  Current Medications (verified): 1)  Synthroid 150 Mcg  Tabs (Levothyroxine Sodium) .... Once Daily` 2)  Dilantin 100 Mg  Caps (Phenytoin Sodium Extended) .[name].. 100 in Am and and 200 in Pm 3)  Xanax 2 Mg  Tabs (Alprazolam) .... Take One Tablet By Mouth 2-3 Tmes Daily As Needed For Anxiety 4)  Rocaltrol 0.25 Mcg  Caps (Calcitriol) .... Once Daily 5)  Trazodone Hcl 100 Mg Tabs (Trazodone Hcl) .... 2 By Mouth Qhs 6)  Paroxetine Hcl 40 Mg  Tabs (Paroxetine Hcl) .[name].. 1 By Mouth Qam 7)  Hydrocodone-Acetaminophen 10-325 Mg  Tabs (Hydrocodone-Acetaminophen) .[name].. 1 Q 4h As Needed By Mouth 8)  Albuterol Sulfate (2.5 Mg/41ml) 0.083%  Nebu (Albuterol Sulfate) .... Qid 9)  Vitamin D3 1000 Unit  Tabs (Cholecalciferol) .[name].. 1 Qd 10)  Enulose 10 Gm/62ml Soln (Lactulose Encephalopathy) .... 30-60 Cc By Mouth Qd 11)  Hydroxyzine Hcl 50 Mg Tabs (Hydroxyzine Hcl) .[name].. 1-2 By Mouth At Bedtime As Needed Insomnia 12)  Neurontin 100 Mg Caps  (Gabapentin) .[name].. 1 By Mouth Three Times A Day For Pain  Allergies: 1)  ! Pcn 2)  ! Oxycontin (Oxycodone Hcl) 3)  ! Lipitor 4)  Morphine 5)  * Oxycodone 6)  * Duragesic  Past History:  Past Surgical History:    Total hip replacement L     (03/27/2007)  Family History:    Family History of CAD Female 1st degree relative <60    Family History Diabetes 1st degree relative     (05/08/2007)  Social History:    Occupation: disabled    Married    Current Smoker    Regular exercise-no     (05/08/2007)  Risk Factors:    Alcohol Use: N/A    >5 drinks/d w/in last 3 months: N/A    Caffeine Use: N/A    Diet: N/A    Exercise: no (05/08/2007)  Risk Factors:    Smoking Status: current (05/08/2007)    Packs/Day: 1pk pd (05/08/2007)    Cigars/wk: N/A    Pipe Use/wk: N/A    Cans of tobacco/wk: N/A    Passive Smoke Exposure: N/A  Past Medical History:    Lung cancer, hx of    COPD    Low back pain    Osteoarthritis  B knees    Seizure disorder    Hypothyroidism    Hip avasc. necrosis L 2007    Anxiety    Depression    Cerebrovascular accident, hx of 89    Colitis 88    Hyperlipidemia  Physical Exam  General:  very sleepy and sedated Nose:  WNL Mouth:  Not dry Lungs:  Normal respiratory effort, chest expands symmetrically. Lungs are clear to auscultation, no crackles or wheezes. Heart:  Normal rate and regular rhythm. S1 and S2 normal without gallop, murmur, click, rub or other extra sounds. Abdomen:  Bowel sounds positive,abdomen soft and non-tender without masses, organomegaly or hernias noted. Msk:  NT spine, w/c Neurologic:  alert , not confused, able to talk.     Impression & Recommendations:  Problem # 1:  APHASIA DUE TO CEREBROVASCULAR DISEASE (ICD-438.11) Assessment Improved On prescription therapy   Problem # 2:  SEIZURE DISORDER (ICD-780.39) Assessment: Unchanged  Her updated medication list for this problem includes:    Dilantin 100 Mg Caps  (Phenytoin sodium extended) .Marland KitchenMarland KitchenMarland KitchenMarland Kitchen 100 in am and and 200 in pm    Neurontin 100 Mg Caps (Gabapentin) .[name]... 1 by mouth three times a day for pain  Problem # 3:  HYPOTENSION (ICD-458.9) poss adrenal insuff Assessment: Deteriorated Restart Florinef Risks of noncompliance with treatment discussed. Compliance encouraged.  Problem # 4:  ADRENAL INSUFFICIENCY, HX OF (ICD-V13.8) Assessment: Deteriorated As per #3  Problem # 7:  HYPERLIPIDEMIA (ICD-272.4) Assessment: Unchanged Statin intol  Complete Medication List: 1)  Synthroid 150 Mcg Tabs (  Levothyroxine sodium) .... Once daily` 2)  Dilantin 100 Mg Caps (Phenytoin sodium extended) .[name].. 100 in am and and 200 in pm 3)  Xanax 2 Mg Tabs (Alprazolam) .... Take one tablet by mouth 2-3 tmes daily as needed for anxiety 4)  Rocaltrol 0.25 Mcg Caps (Calcitriol) .... Once daily 5)  Trazodone Hcl 100 Mg Tabs (Trazodone hcl) .... 2 by mouth qhs 6)  Paroxetine Hcl 40 Mg Tabs (Paroxetine hcl) .[name].. 1 by mouth qam 7)  Hydrocodone-acetaminophen 10-325 Mg Tabs (Hydrocodone-acetaminophen) .[name].. 1 q 4h as needed by mouth 8)  Albuterol Sulfate (2.5 Mg/62ml) 0.083% Nebu (Albuterol sulfate) .... Qid 9)  Vitamin D3 1000 Unit Tabs (Cholecalciferol) .[name].. 1 qd 10)  Enulose 10 Gm/96ml Soln (Lactulose encephalopathy) .... 30-60 cc by mouth qd 11)  Hydroxyzine Hcl 50 Mg Tabs (Hydroxyzine hcl) .[name].. 1-2 by mouth at bedtime as needed insomnia 12)  Neurontin 100 Mg Caps (Gabapentin) .[name].. 1 by mouth three times a day for pain 13)  Fludrocortisone Acetate 0.1 Mg Tabs (Fludrocortisone acetate) .[name].. 1 by mouth two times a day in am and lunch 14)  Imitrex 100 Mg Tabs (Sumatriptan succinate) .[name].. 1 by mouth at the onset of migraine. may repeat dose in one hour if headache not resolved  Patient Instructions: 1)  Please schedule a follow-up appointment in 2 months. 2)  BMP prior to visit, ICD-9: 995.2 Prescriptions: IMITREX 100 MG TABS (SUMATRIPTAN SUCCINATE) 1 by mouth at the  onset of migraine. May repeat dose in one hour if headache not resolved  #12 x 3   Entered and Authorized by:   Tresa Garter MD   Signed by:   Tresa Garter MD on 10/01/2008   Method used:   Electronically to        [facility] (retail)       [address]       [place], [place]  [address]       Ph: [phone]       Fax: [phone]   RxID:   [id number] FLUDROCORTISONE ACETATE 0.1 MG TABS (FLUDROCORTISONE ACETATE) 1 by mouth two times a day in AM and lunch  #60 x 12   Entered and Authorized by:   Tresa Garter MD   Signed by:   Tresa Garter MD on 10/01/2008   Method used:   Electronically to        [facility] (retail)       [address]       [place], [place]  [address]       Ph: [phone]       Fax: [phone]   RxID:   [id number]

## 2010-07-26 NOTE — Procedures (Signed)
Summary: Upper Endoscopy  Patient: Kaylee Hansen Note: All result statuses are Final unless otherwise noted.  Tests: (1) Upper Endoscopy (EGD)   EGD Upper Endoscopy       DONE     [facility]     [address]     [place], [place]  [address]           ENDOSCOPY PROCEDURE REPORT           PATIENT:  Kaylee, Hansen  MR#:  [id number]     BIRTHDATE:  [birth year], 53 yrs. old  GENDER:  female           ENDOSCOPIST:  Iva Boop, MD, [facility]     Referred by:          Hospitalist           PROCEDURE DATE:  05/12/2009     PROCEDURE:  EGD with balloon dilatation     ASA CLASS:  Class III     INDICATIONS:  hematemesis, dysphagia           MEDICATIONS:   meperidine (Demerol) 25 mg IV, Versed 2.5 mg IV ?     allergy/sensitivity to Fentanyl (Duragesic patch)     TOPICAL ANESTHETIC:  Cetacaine Spray           DESCRIPTION OF PROCEDURE:   After the risks benefits and     alternatives of the procedure were thoroughly explained, informed     consent was obtained.  The EG-[id number] ([id number]) endoscope was     introduced through the mouth and advanced to the second portion of     the duodenum, without limitations.  The instrument was slowly     withdrawn as the mucosa was fully examined.     <<PROCEDUREIMAGES>>           An ulcer was found in the distal esophagus. 30-36 cm, long, fairly     deep ulcer, clean-based. 2-3 mm wide. A hiatal hernia was found.     It was 3 cm in size. 36-39 cm with ? of stricture at GE junction.     18 mm balloon dilator inflated and no significant impact.     Otherwise the examination was normal.    Retroflexed views revealed     no abnormalities.    The scope was then withdrawn from the patient     and the procedure completed.           COMPLICATIONS:  None           ENDOSCOPIC IMPRESSION:     1) Ulcer in the distal esophagus, from reflux vs. food     impaction/regurgitation or both (suspect both)     2) 3 cm hiatal hernia     3) Otherwise  normal examination, ? of stricture at GE junction     but no significant effect with 18 mm balloon inflation.     RECOMMENDATIONS:     PPI twice daily for now then probably qd after outpatient     follow-up     Modify diet, chew well and cut food into small pieces     Office visit Dr. Leone Payor 6 weeks     Ba swallow or manometry could be needed for persistent problems.     Iva Boop, MD, [facility]           n.     eSIGNED:  Iva Boop at 05/12/2009 05:38 PM           Tora Duck, [id number]  Note: An exclamation mark (!) indicates a result that was not dispersed into the flowsheet. Document Creation Date: 05/12/2009 5:38 PM _______________________________________________________________________  (1) Order result status: Final Collection or observation date-time: 05/12/2009 17:27 Requested date-time:  Receipt date-time:  Reported date-time:  Referring Physician:   Ordering Physician: Stan Head [phone]) Specimen Source:  Source: Launa Grill Order Number: [id number] Lab site:

## 2010-07-26 NOTE — Letter (Signed)
Summary: [employer]   Imported By: Lanelle Bal 07/17/2008 14:42:44  _____________________________________________________________________  External Attachment:    Type:   Image     Comment:   External Document

## 2010-07-26 NOTE — Letter (Signed)
Summary: [facility] Instructions  [facility]  [address] [place], [place] [address]   Phone: [phone]  Fax: [phone]       Kaylee Hansen    04-02-1961    MRN: [id number]        Procedure Day /Date: 10/28/2009 Thursday     Arrival Time: 7:30am     Procedure Time: 8:30am     Location of Procedure:                    X  [facility] (4th Floor)   PREPARATION FOR COLONOSCOPY WITH MOVIPREP   Starting 5 days prior to your procedure 10/23/2009 do not eat nuts, seeds, popcorn, corn, beans, peas,  salads, or any raw vegetables.  Do not take any fiber supplements (e.g. Metamucil, Citrucel, and Benefiber).  THE DAY BEFORE YOUR PROCEDURE         DATE:  10/27/2009  DAY: Wednesday   1.  Drink clear liquids the entire day-NO SOLID FOOD  2.  Do not drink anything colored red or purple.  Avoid juices with pulp.  No orange juice.  3.  Drink at least 64 oz. (8 glasses) of fluid/clear liquids during the day to prevent dehydration and help the prep work efficiently.  CLEAR LIQUIDS INCLUDE: Water Jello Ice Popsicles Tea (sugar ok, no milk/cream) Powdered fruit flavored drinks Coffee (sugar ok, no milk/cream) Gatorade Juice: apple, white grape, white cranberry  Lemonade Clear bullion, consomm, broth Carbonated beverages (any kind) Strained chicken noodle soup Hard Candy                             4.  In the morning, mix first dose of MoviPrep solution:    Empty 1 Pouch A and 1 Pouch B into the disposable container    Add lukewarm drinking water to the top line of the container. Mix to dissolve    Refrigerate (mixed solution should be used within 24 hrs)  5.  Begin drinking the prep at 5:00 p.m. The MoviPrep container is divided by 4 marks.   Every 15 minutes drink the solution down to the next mark (approximately 8 oz) until the full liter is complete.   6.  Follow completed prep with 16 oz of clear liquid of your choice (Nothing red or purple).   Continue to drink clear liquids until bedtime.  7.  Before going to bed, mix second dose of MoviPrep solution:    Empty 1 Pouch A and 1 Pouch B into the disposable container    Add lukewarm drinking water to the top line of the container. Mix to dissolve    Refrigerate  THE DAY OF YOUR PROCEDURE      DATE: 10/28/2009 DAY: Thursday  Beginning at 3:30am (5 hours before procedure):         1. Every 15 minutes, drink the solution down to the next mark (approx 8 oz) until the full liter is complete.  2. Follow completed prep with 16 oz. of clear liquid of your choice.    3. You may drink clear liquids until 6:30am (2 HOURS BEFORE PROCEDURE).   MEDICATION INSTRUCTIONS  Unless otherwise instructed, you should take regular prescription medications with a small sip of water   as early as possible the morning of your procedure.           OTHER INSTRUCTIONS  You will need a responsible adult at least 57  years of age to accompany you and drive you home.   This person must remain in the waiting room during your procedure.  Wear loose fitting clothing that is easily removed.  Leave jewelry and other valuables at home.  However, you may wish to bring a book to read or  an iPod/MP3 player to listen to music as you wait for your procedure to start.  Remove all body piercing jewelry and leave at home.  Total time from sign-in until discharge is approximately 2-3 hours.  You should go home directly after your procedure and rest.  You can resume normal activities the  day after your procedure.  The day of your procedure you should not:   Drive   Make legal decisions   Operate machinery   Drink alcohol   Return to work  You will receive specific instructions about eating, activities and medications before you leave.    The above instructions have been reviewed and explained to me by   _______________________    I fully understand and can verbalize these instructions  _____________________________ Date _________

## 2010-07-26 NOTE — Letter (Signed)
Summary: [facility]   Imported By: Lester Oakman 11/06/2008 10:12:36  _____________________________________________________________________  External Attachment:    Type:   Image     Comment:   External Document

## 2010-07-26 NOTE — Assessment & Plan Note (Signed)
Summary: SKIN TAG REMOVAL/[facility]   Vital Signs:  Patient profile:   55 year old female Weight:      177 pounds O2 Sat:      89 % Temp:     98.5 degrees F oral Pulse rate:   81 / minute BP sitting:   86 / 40  (left arm)  Vitals Entered By: Tora Perches (August 26, 2009 9:23 AM)  Procedure Note  Mole Biopsy/Removal: The patient complains of changing mole. Indication: changing lesion  Procedure # 1: shave biopsy    Size (in cm): 0.3 x 0.2    Region: lower lat    Location: R lower eyelid    Comment: Risks including but not limited by incomplete procedure, bleeding, infection, recurrence were discussed with the patient. Consent form was signed.     Instrument used: dermablade    Anesthesia: 1% lidocaine w/epinephrine .2 cc  Procedure # 2: shave biopsy    Size (in cm): 0.6 x 0.4    Region: RUQ    Location: under R breast    Comment: Tolerated well. Complicatons - none.    Instrument used: same    Anesthesia: 0.5 ml 1% lidocaine w/epinephrine  Cleaned and prepped with: alcohol and betadine Wound dressing: neosporin and bandaid Instructions: daily dressing changes  CC: skin bx Is Patient Diabetic? No   Primary Care Provider:  Tresa Garter MD  CC:  skin bx.  History of Present Illness: Skin Bx  Preventive Screening-Counseling & Management  Alcohol-Tobacco     Smoking Status: current  Current Medications (verified): 1)  Synthroid 150 Mcg  Tabs (Levothyroxine Sodium) .... Once Daily` 2)  Dilantin 100 Mg  Caps (Phenytoin Sodium Extended) .[name].. 100 in Am and and 200 in Pm 3)  Xanax 2 Mg  Tabs (Alprazolam) .... Take One Tablet By Mouth 2-3 Tmes Daily As Needed For Anxiety 4)  Rocaltrol 0.25 Mcg  Caps (Calcitriol) .... Once Daily 5)  Trazodone Hcl 100 Mg Tabs (Trazodone Hcl) .... 2 By Mouth Qhs 6)  Paroxetine Hcl 40 Mg  Tabs (Paroxetine Hcl) .[name].. 1 By Mouth Qam 7)  Hydrocodone-Acetaminophen 10-325 Mg  Tabs (Hydrocodone-Acetaminophen) .[name].. 1 Q 4h As Needed By  Mouth 8)  Albuterol Sulfate (2.5 Mg/38ml) 0.083%  Nebu (Albuterol Sulfate) .... Qid 9)  Vitamin D3 1000 Unit  Tabs (Cholecalciferol) .[name].. 1 Qd 10)  Miralax   Powd (Polyethylene Glycol 3350) .[name].. 1 Dose (17g) Dissolved in Water Two Times A Day 11)  Hydroxyzine Hcl 50 Mg Tabs (Hydroxyzine Hcl) .[name].. 1-2 By Mouth At Bedtime As Needed Insomnia 12)  Neurontin 100 Mg Caps (Gabapentin) .[name].. 1 By Mouth Three Times A Day For Pain 13)  Imitrex 100 Mg Tabs (Sumatriptan Succinate) .[name].. 1 By Mouth At The Onset of Migraine. May Repeat Dose in One Hour If Headache Not Resolved 14)  Dulcolax 5 Mg Tbec (Bisacodyl) .... As Needed For Constipation 15)  Hhn .... As Dirr  Allergies: 1)  ! Pcn 2)  ! Oxycontin (Oxycodone Hcl) 3)  ! Lipitor 4)  Morphine 5)  * Oxycodone 6)  * Duragesic/fentanyl Patch  Physical Exam  Skin:  3 mm growth R lower eyelid 7x4 mole under R breast   Impression & Recommendations:  Problem # 1:  NEOPLASM OF UNCERTAIN BEHAVIOR OF SKIN (ICD-238.2) Assessment Comment Only  Orders: Shave Skin Lesion < 0.5cm face/ears/eyelids/nose/lips/mm (11310) Shave Skin Lesion 0.6-1.0 cm/trunk/arm/leg (11301)  Complete Medication List: 1)  Synthroid 150 Mcg Tabs (Levothyroxine sodium) .... Once daily`  2)  Dilantin 100 Mg Caps (Phenytoin sodium extended) .[name].. 100 in am and and 200 in pm 3)  Xanax 2 Mg Tabs (Alprazolam) .... Take one tablet by mouth 2-3 tmes daily as needed for anxiety 4)  Rocaltrol 0.25 Mcg Caps (Calcitriol) .... Once daily 5)  Trazodone Hcl 100 Mg Tabs (Trazodone hcl) .... 2 by mouth qhs 6)  Paroxetine Hcl 40 Mg Tabs (Paroxetine hcl) .[name].. 1 by mouth qam 7)  Hydrocodone-acetaminophen 10-325 Mg Tabs (Hydrocodone-acetaminophen) .[name].. 1 q 4h as needed by mouth 8)  Albuterol Sulfate (2.5 Mg/65ml) 0.083% Nebu (Albuterol sulfate) .... Qid 9)  Vitamin D3 1000 Unit Tabs (Cholecalciferol) .[name].. 1 qd 10)  Miralax Powd (Polyethylene glycol 3350) .[name].. 1 dose (17g) dissolved in water two times  a day 11)  Hydroxyzine Hcl 50 Mg Tabs (Hydroxyzine hcl) .[name].. 1-2 by mouth at bedtime as needed insomnia 12)  Neurontin 100 Mg Caps (Gabapentin) .[name].. 1 by mouth three times a day for pain 13)  Imitrex 100 Mg Tabs (Sumatriptan succinate) .[name].. 1 by mouth at the onset of migraine. may repeat dose in one hour if headache not resolved 14)  Dulcolax 5 Mg Tbec (Bisacodyl) .... As needed for constipation 15)  Hhn  .... As dirr

## 2010-07-26 NOTE — Miscellaneous (Signed)
Summary: Skin Bx/[employer] [employer]  Skin Bx/[employer] [facility]   Imported By: Sherian Rein 09/02/2009 09:58:17  _____________________________________________________________________  External Attachment:    Type:   Image     Comment:   External Document

## 2010-07-26 NOTE — Assessment & Plan Note (Signed)
Summary: f/u appt/$50/cd   Vital Signs:  Patient Profile:   55 Years Old Female Weight:      169 pounds Temp:     97.7 degrees F oral Pulse rate:   92 / minute BP sitting:   110 / 54  (left arm)  Vitals Entered By: Tora Perches (May 28, 2008 11:24 AM)                 Chief Complaint:  Multiple medical problems or concerns.  History of Present Illness: The patient presents for a follow up of abd. pain, CT results, depression and headaches. Better after 2 enemas     Current Allergies (reviewed today): ! PCN ! OXYCONTIN (OXYCODONE HCL) ! LIPITOR MORPHINE * OXYCODONE * DURAGESIC  Past Medical History:    Reviewed history from 07/12/2007 and no changes required:       Lung cancer, hx of       COPD       Low back pain       Osteoarthritis       Seizure disorder       Hypothyroidism       Hip avasc. necrosis L 2007       Anxiety       Depression   Family History:    Reviewed history from 05/08/2007 and no changes required:       Family History of CAD Female 1st degree relative <60       Family History Diabetes 1st degree relative  Social History:    Reviewed history from 05/08/2007 and no changes required:       Occupation: disabled       Married       Current Smoker       Regular exercise-no    Review of Systems       The patient complains of abdominal pain.  The patient denies fever, chest pain, syncope, and melena.     Physical Exam  General:     Chronically ill-appearing NAD Head:     Normocephalic and atraumatic without obvious abnormalities. No apparent alopecia or balding. Nose:     External nasal examination shows no deformity or inflammation. Nasal mucosa are pink and moist without lesions or exudates. Mouth:     Not dry Neck:     No deformities, masses, or tenderness noted. Lungs:     CTA Heart:     RRR 2/6 systolic heart murmur  Abdomen:     Less tender in upper 1/2 B, no masses, no rigidity, and no rebound tenderness.     Msk:     Cane Lumbar-sacral spine is tender to palpation over paraspinal muscles and painfull with the ROM  Neurologic:     alert & oriented X3.   Skin:     no rashes.   Psych:     Oriented X3 and normally interactive.      Impression & Recommendations:  Problem # 1:  ABDOMINAL PAIN, UNSPECIFIED SITE (ICD-789.00) due to #2 Assessment: Improved The labs and CT were reviewd with the patient.  Her updated medication list for this problem includes:    Enulose 10 Gm/76ml Soln (Lactulose encephalopathy) .Marland KitchenMarland KitchenMarland KitchenMarland Kitchen 30-60 cc by mouth qd    Amitiza 24 Mcg Caps (Lubiprostone) .[name]... 2 by mouth qd   Problem # 2:  CONSTIPATION (ICD-564.00) Assessment: Improved Amitiza or Lactulose  Problem # 3:  LOW BACK PAIN (ICD-724.2) Assessment: Unchanged  Her updated medication list for this problem includes:  Hydrocodone-acetaminophen 10-325 Mg Tabs (Hydrocodone-acetaminophen) .[name]... 1 q 4h as needed by mouth   Problem # 4:  ANXIETY (ICD-300.00) Assessment: Unchanged  Her updated medication list for this problem includes:    Xanax 2 Mg Tabs (Alprazolam) .[name]... Take one tablet by mouth 2-3 tmes daily as needed for anxiety    Trazodone Hcl 100 Mg Tabs (Trazodone hcl)    Paroxetine Hcl 40 Mg Tabs (Paroxetine hcl) .[name]... 1 by mouth qam   Complete Medication List: 1)  Synthroid 150 Mcg Tabs (Levothyroxine sodium) .... Once daily` 2)  Dilantin 100 Mg Caps (Phenytoin sodium extended) .[name].. 1 by mouth bid 3)  Xanax 2 Mg Tabs (Alprazolam) .... Take one tablet by mouth 2-3 tmes daily as needed for anxiety 4)  Ambien 10 Mg Tabs (Zolpidem tartrate) .... Take one tablet by mouth at bedtime for insomnia 5)  Rocaltrol 0.25 Mcg Caps (Calcitriol) .... Once daily 6)  Trazodone Hcl 100 Mg Tabs (Trazodone hcl) 7)  Paroxetine Hcl 40 Mg Tabs (Paroxetine hcl) .[name].. 1 by mouth qam 8)  Hydrocodone-acetaminophen 10-325 Mg Tabs (Hydrocodone-acetaminophen) .[name].. 1 q 4h as needed by mouth 9)  Albuterol Sulfate (2.5 Mg/32ml)  0.083% Nebu (Albuterol sulfate) .... Qid 10)  Vitamin D3 1000 Unit Tabs (Cholecalciferol) .[name].. 1 qd 11)  Enulose 10 Gm/67ml Soln (Lactulose encephalopathy) .... 30-60 cc by mouth qd 12)  Amitiza 24 Mcg Caps (Lubiprostone) .... 2 by mouth qd   Patient Instructions: 1)  Please schedule a follow-up appointment in 2 months.   Prescriptions: HYDROCODONE-ACETAMINOPHEN 10-325 MG  TABS (HYDROCODONE-ACETAMINOPHEN) 1 q 4h as needed by mouth  #120 x 6   Entered and Authorized by:   Tresa Garter MD   Signed by:   Tresa Garter MD on 05/28/2008   Method used:   Print then Give to Patient   RxID:   [id number] AMBIEN 10 MG  TABS (ZOLPIDEM TARTRATE) Take one tablet by mouth at bedtime for insomnia  #30 x 6   Entered and Authorized by:   Tresa Garter MD   Signed by:   Tresa Garter MD on 05/28/2008   Method used:   Print then Give to Patient   RxID:   [id number] ENULOSE 10 GM/15ML SOLN (LACTULOSE ENCEPHALOPATHY) 30-60 cc by mouth qd  #1000 ml x 12   Entered and Authorized by:   Tresa Garter MD   Signed by:   Tresa Garter MD on 05/28/2008   Method used:   Electronically to        [facility] (retail)       [address]       [place], [place]  [address]       Ph: [phone]       Fax: [phone]   RxID:   [id number] AMITIZA 24 MCG  CAPS (LUBIPROSTONE) 2 by mouth qd  #60 x 12   Entered and Authorized by:   Tresa Garter MD   Signed by:   Tresa Garter MD on 05/28/2008   Method used:   Print then Give to Patient   RxID:   [id number] ENULOSE 10 GM/15ML SOLN (LACTULOSE ENCEPHALOPATHY) 30-60 cc by mouth qd  #qs x 12   Entered and Authorized by:   Tresa Garter MD   Signed by:   Tresa Garter MD on 05/28/2008   Method used:   Print then Give to Patient   RxID:  [id number]  ] 

## 2010-07-26 NOTE — Assessment & Plan Note (Signed)
Summary: 6 WK ROV/NWS   Vital Signs:  Patient Profile:   55 Years Old Female Weight:      170 pounds Temp:     97.9 degrees F Pulse rate:   84 / minute BP sitting:   103 / 57  (left arm)  Vitals Entered By: Tora Perches (May 08, 2007 2:52 PM)                 Chief Complaint:  Multiple medical problems or concerns.  History of Present Illness: The patient presents for a follow up of back pain, anxiety, depression and headaches.   Current Allergies (reviewed today): ! PCN ! OXYCONTIN (OXYCODONE HCL) ! LIPITOR MORPHINE * OXYCODONE * DURAGESIC  Past Medical History:    Reviewed history from 03/27/2007 and no changes required:       Lung cancer, hx of       COPD       Low back pain       Osteoarthritis       Seizure disorder       Hypothyroidism   Family History:    Family History of CAD Female 1st degree relative <60    Family History Diabetes 1st degree relative  Social History:    Occupation: disabled    Married    Current Smoker    Regular exercise-no   Risk Factors:  Tobacco use:  current    Cigarettes:  Yes -- 1pk pd pack(s) per day Exercise:  no    Physical Exam  General:     Well-developed,well-nourished,in no acute distress; alert,appropriate and cooperative throughout examination Eyes:     No corneal or conjunctival inflammation noted. EOMI. Perrla. Funduscopic exam benign, without hemorrhages, exudates or papilledema. Vision grossly normal. Nose:     External nasal examination shows no deformity or inflammation. Nasal mucosa are pink and moist without lesions or exudates. Mouth:     Oral mucosa and oropharynx without lesions or exudates.  Teeth in good repair. Neck:     No deformities, masses, or tenderness noted. Lungs:     Some wheezes Heart:     Normal rate and regular rhythm. S1 and S2 normal without gallop, murmur, click, rub or other extra sounds. Abdomen:     Bowel sounds positive,abdomen soft and non-tender without  masses, organomegaly or hernias noted. Msk:     Cane to ambulate Skin:     Intact without suspicious lesions or rashes Psych:     subdued.      Impression & Recommendations:  Problem # 1:  LOW BACK PAIN (ICD-724.2) Assessment: Unchanged  The following medications were removed from the medication list:    Hydrocodone-acetaminophen 10-325 Mg Tabs (Hydrocodone-acetaminophen) .[name]... 1-2 once daily    Meprozine 50-25 Mg Caps (Meperidine-promethazine) .[name]... As needed    Hydrocodone-acetaminophen 10-325 Mg Tabs (Hydrocodone-acetaminophen) .[name]... 1 qid prn    Vicodin Hp 10-660 Mg Tabs (Hydrocodone-acetaminophen)  Her updated medication list for this problem includes:    Hydrocodone-acetaminophen 10-325 Mg Tabs (Hydrocodone-acetaminophen) .[name]... 1 q 4h prn   Problem # 2:  COPD (ICD-496) Assessment: Unchanged  Her updated medication list for this problem includes:    Albuterol Sulfate (2.5 Mg/69ml) 0.083% Nebu (Albuterol sulfate) ..... 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 1 tid  Her updated medication list for this problem includes:    Dilantin 100 Mg Caps (Phenytoin sodium extended) .[name]... 1 by mouth bid    Dilantin Infatabs 50 Mg Chew (Phenytoin) .[name]... 1 by mouth qhs   Complete Medication List: 1)  Synthroid 150 Mcg Tabs (Levothyroxine sodium) .... Once daily` 2)  Dilantin 100 Mg Caps (Phenytoin sodium extended) .[name].. 1 by mouth bid 3)  Dilantin Infatabs 50 Mg Chew (Phenytoin) .[name].. 1 by mouth qhs 4)  Xanax 2 Mg Tabs (Alprazolam) .... Take one tablet by mouth 2-3 tmes daily as needed for anxiety 5)  Ambien 10 Mg Tabs (Zolpidem tartrate) .... Take one tablet by mouth at bedtime for insomnia 6)   Rocaltrol 0.25 Mcg Caps (Calcitriol) .... Once daily 7)  Trazodone Hcl 100 Mg Tabs (Trazodone hcl) 8)  Paroxetine Hcl 40 Mg Tabs (Paroxetine hcl) .[name].. 1 by mouth qam 9)  Hydrocodone-acetaminophen 10-325 Mg Tabs (Hydrocodone-acetaminophen) .[name].. 1 q 4h prn 10)  Albuterol Sulfate (2.5 Mg/80ml) 0.083% Nebu (Albuterol sulfate) .... Qid 11)  Keflex 500 Mg Caps (Cephalexin) .[name].. 1 by mouth once daily for bladder  Other Orders: Influenza Vaccine MCR (93716)   Patient Instructions: 1)  Please schedule a follow-up appointment in 2 months. 2)  BMP prior to visit, ICD-9:401.1 3)  Hepatic Panel prior to visit, ICD-9: 4)  TSH prior to visit, ICD-9: 5)  Dilantin level 995.2    Prescriptions: DILANTIN INFATABS 50 MG  CHEW (PHENYTOIN) 1 by mouth qhs  #30 x 12   Entered and Authorized by:   Tresa Garter MD   Signed by:   Tresa Garter MD on 05/08/2007   Method used:   Electronically sent to ...       [facility]       [address]       [place], [place]  [address]       Ph: [phone]       Fax: [phone]   RxID:   [id number] DILANTIN 100 MG  CAPS (PHENYTOIN SODIUM EXTENDED) 1 by mouth bid  #60 x 12   Entered and Authorized by:   Tresa Garter MD   Signed by:   Tresa Garter MD on 05/08/2007   Method used:   Electronically sent to ...       [facility]       [address]       [place], [place]  [address]       Ph: [phone]       Fax: [phone]   RxID:   [id number] Prudy Feeler 2 MG  TABS (ALPRAZOLAM) Take one tablet by mouth 2-3 tmes daily as needed for anxiety  #90 x 6   Entered and Authorized by:   Tresa Garter MD   Signed by:   Tresa Garter MD on 05/08/2007   Method used:   Print then Give to Patient   RxID:   [id number] AMBIEN 10 MG  TABS (ZOLPIDEM TARTRATE) Take one tablet by mouth at bedtime for insomnia  #30 x 6   Entered and Authorized by:   Tresa Garter MD   Signed by:   Tresa Garter MD on 05/08/2007   Method used:   Print then Give to Patient   RxID:   [id number] HYDROCODONE-ACETAMINOPHEN 17-408  MG  TABS (HYDROCODONE-ACETAMINOPHEN) 1 q 4h prn  #180 x 1   Entered and Authorized by:   Tresa Garter MD   Signed by:   Tresa Garter MD on 05/08/2007   Method used:   Print then Give to Patient   RxID:   [id number]  ]  Influenza Vaccine    Vaccine Type: Fluvax MCR    Site: right deltoid    Mfr: Sanofi Pasteur    Dose: 0.5 ml    Route: IM    Given by: Tora Perches    Exp. Date: 12/24/2007    Lot #: [id number]    VIS given: 12/23/04 version given May 08, 2007.  Flu Vaccine Consent Questions    Do you have a history of severe allergic reactions to this vaccine? no    Any prior history of allergic reactions to egg and/or gelatin? no    Do you have a sensitivity to the preservative Thimersol? no    Do you have a past history of Guillan-Barre Syndrome? no    Do you currently have an acute febrile illness? no    Have you ever had a severe reaction to latex? no    Vaccine information given and explained to patient? yes    Are you currently pregnant? no

## 2010-07-26 NOTE — Assessment & Plan Note (Signed)
Summary: Adm. note   Vital Signs:  Patient profile:   55 year old female Temp:     98.3 degrees F Pulse rate:   72 / minute Resp:     18 per minute BP supine:   98 / 50  History of Present Illness: CC: confusion HPI: 55 yo woke up unable to speak, confused and sleepy. She had a bad HA x 5 d. No n/v; no LOC or fever. No susp of  drug overuse per husband. 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) 2)  Cerebrovascular Accident, Hx of  (ICD-V12.50) 3)  Gait Disturbance  (ICD-781.2) 4)  Depression  (ICD-311) 5)  Anxiety  (ICD-300.00) 6)  Constipation  (ICD-564.00) 7)  Bronchitis, Acute  (ICD-466.0) 8)  Abdominal Pain, Unspecified Site  (ICD-789.00) 9)  Acute Cystitis  (ICD-595.0) 10)  Essential Hypertension, Benign  (ICD-401.1) 11)  Hypothyroidism  (ICD-244.9) 12)  Osteoarthritis  (ICD-715.90) 13)  Low Back Pain  (ICD-724.2) 14)  COPD  (ICD-496) 15)  Lung Cancer, Hx of  (ICD-V10.11) 16)  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) 17)  Colitis  (ICD-558.9) 18)  Vitamin D Deficiency  (ICD-268.9) 19)  Pyelonephritis  (ICD-590.80) 20)  Seizure Disorder  (ICD-780.39) 21)  Neoplasm, Malignant, Lung, 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 Once Daily` 2)  Dilantin 100 Mg  Caps (Phenytoin Sodium Extended) .[name].. 1 By Mouth Bid 3)  Xanax 2 Mg  Tabs (Alprazolam) .... Take One Tablet By Mouth 2-3 Tmes Daily As Needed For Anxiety 4)   Rocaltrol 0.25 Mcg  Caps (Calcitriol) .... Once Daily 5)  Trazodone Hcl 100 Mg Tabs (Trazodone Hcl) .... 2 By Mouth Qhs 6)  Paroxetine Hcl 40 Mg  Tabs (Paroxetine Hcl) .[name].. 1 By Mouth Qam 7)  Hydrocodone-Acetaminophen 10-325 Mg  Tabs (Hydrocodone-Acetaminophen) .[name].. 1 Q 4h As Needed By Mouth 8)  Albuterol Sulfate (2.5 Mg/60ml) 0.083%  Nebu (Albuterol Sulfate) .... Qid 9)  Vitamin D3 1000 Unit  Tabs (Cholecalciferol) .[name].. 1 Qd 10)  Enulose 10 Gm/20ml Soln (Lactulose Encephalopathy) .... 30-60 Cc By Mouth Qd 11)  Hydroxyzine Hcl 50 Mg Tabs (Hydroxyzine Hcl) .[name].. 1-2 By Mouth At Bedtime As Needed Insomnia 12)  Neurontin 100 Mg Caps (Gabapentin) .[name].. 1 By Mouth Three Times A Day For Pain  Allergies: 1)  ! Pcn 2)  ! Oxycontin (Oxycodone Hcl) 3)  ! Lipitor 4)  Morphine 5)  * Oxycodone 6)  * Duragesic  Past History:  Past Medical History:    Reviewed history from 07/02/2008 and no changes required:    Lung cancer, hx of    COPD    Low back pain    Osteoarthritis  B knees    Seizure disorder    Hypothyroidism    Hip avasc. necrosis L 2007    Anxiety    Depression    Cerebrovascular accident, hx of 89    Colitis 88  Past Surgical History:  Reviewed history from 03/27/2007 and no changes required:    Total hip replacement L  Family History:    Reviewed history from 05/08/2007 and no changes required:       Family History of CAD Female 1st degree relative <60       Family History Diabetes 1st degree relative  Social History:    Reviewed history from 05/08/2007 and no changes required:       Occupation: disabled       Married       Current Smoker       Regular exercise-no  Review of Systems       Unobtainable from pt  Physical Exam  General:  Chronically ill-appearing NAD Head:  Normocephalic and atraumatic without obvious abnormalities. No apparent alopecia or balding. Eyes:  No corneal or conjunctival inflammation noted. EOMI. Perrla.  Ears:  External ear exam shows  no significant lesions or deformities.  Otoscopic examination reveals clear canals, tympanic membranes are intact bilaterally without bulging, retraction, inflammation or discharge. Hearing is grossly normal bilaterally. Nose:  External nasal examination shows no deformity or inflammation. Nasal mucosa are pink and moist without lesions or exudates. Mouth:  Dryish Neck:  No deformities, masses, or tenderness noted. Lungs:  CTA Heart:  RRR 2/6 systolic heart murmur  Abdomen:  Less tender in upper 1/2 B, no masses, no rigidity, and no rebound tenderness.   Msk:  NT spine Extremities:  trace left pedal edema and trace right pedal edema.   Neurologic:  alert , only able to respond with"yes", smcking lips Weak LE Bcranial nerves II-XII intact, strength normal in all extremities, aphasic, and confused.   Skin:  no rashes.   Cervical Nodes:  No lymphadenopathy noted Inguinal Nodes:  No significant adenopathy Psych:    flat affect and subdued.     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)  Problem # 2:  CEREBROVASCULAR ACCIDENT, HX OF (ICD-V12.50)  Orders: EMR Electrical engineer Code (EMRMisc)  Problem # 3:  ESSENTIAL HYPERTENSION, BENIGN (ICD-401.1) Assessment: Comment Only  Problem # 4:  LOW BACK PAIN (ICD-724.2) Assessment: Comment Only  Her updated medication list for this problem includes:    Hydrocodone-acetaminophen 10-325 Mg Tabs (Hydrocodone-acetaminophen) .[name]... 1 q 4h as needed by mouth  Problem # 5:  LUNG CANCER, HX OF (ICD-V10.11)  Problem # 6:  SEIZURE DISORDER (ICD-780.39) poss postictal Assessment: Comment Only  Her updated medication list for this problem includes:    Dilantin 100 Mg Caps (Phenytoin sodium extended) .[name]... 1 by mouth bid    Neurontin 100 Mg Caps (Gabapentin) .[name]... 1 by mouth three times a day for pain  Orders: EMR  Misc Charge Code [facility])  Complete Medication List: 1)  Synthroid 150 Mcg Tabs (Levothyroxine sodium) .... Once daily` 2)  Dilantin 100 Mg Caps (Phenytoin sodium extended) .[name].. 1 by mouth bid 3)  Xanax 2 Mg Tabs (Alprazolam) .... Take one tablet by mouth 2-3 tmes daily as needed for anxiety 4)  Rocaltrol 0.25 Mcg Caps (Calcitriol) .... Once daily 5)  Trazodone Hcl 100 Mg Tabs (Trazodone hcl) .... 2 by mouth qhs 6)  Paroxetine Hcl 40 Mg Tabs (Paroxetine hcl) .[name].. 1 by mouth qam 7)  Hydrocodone-acetaminophen 10-325 Mg Tabs (Hydrocodone-acetaminophen) .[name].. 1 q 4h as needed by mouth 8)  Albuterol Sulfate (2.5 Mg/45ml) 0.083% Nebu (Albuterol sulfate) .... Qid 9)  Vitamin  D3 1000 Unit Tabs (Cholecalciferol) .[name].. 1 qd 10)  Enulose 10 Gm/62ml Soln (Lactulose encephalopathy) .... 30-60 cc by mouth qd 11)  Hydroxyzine Hcl 50 Mg Tabs (Hydroxyzine hcl) .[name].. 1-2 by mouth at bedtime as needed insomnia 12)  Neurontin 100 Mg Caps (Gabapentin) .[name].. 1 by mouth three times a day for pain

## 2010-07-26 NOTE — Miscellaneous (Signed)
Summary: yeast in UA  Clinical Lists Changes  Medications: Added new medication of DIFLUCAN 100 MG TABS (FLUCONAZOLE) 1 by mouth qd - Signed Rx of DIFLUCAN 100 MG TABS (FLUCONAZOLE) 1 by mouth qd;  #10 x 0;  Signed;  Entered by: Tresa Garter MD;  Authorized by: Tresa Garter MD;  Method used: Electronically to [facility] [address], [place], [place], [place]  [address], Ph: [phone], Fax: [phone]    Prescriptions: DIFLUCAN 100 MG TABS (FLUCONAZOLE) 1 by mouth qd  #10 x 0   Entered and Authorized by:   Tresa Garter MD   Signed by:   Tresa Garter MD on 05/14/2008   Method used:   Electronically to        [facility] (retail)       [address]       [place], [place]  [address]       Ph: [phone]       Fax: [phone]   RxID:   [id number]

## 2010-07-26 NOTE — Progress Notes (Signed)
  Phone Note Call from Patient   Caller: Patient Reason for Call: Acute Illness Complaint: Urinary/GYN Problems Action Taken: Phone Call Completed, Rx Called In Details of Complaint: dysuria -kidney infection Summary of Call: ppt called with complaint of UTI. Came to office for UA-positive (see lab) Plan: cipro 250 mg two times a day for 5 days. Pt instructed to call if not better in 48 hours. Initial call taken by: MEN

## 2010-07-26 NOTE — Medication Information (Signed)
Summary: Approval for Rx Zolpidem/[facility]  Approval for Rx Zolpidem/[facility]   Imported By: Esmeralda Links D'jimraou 07/17/2008 14:48:29  _____________________________________________________________________  External Attachment:    Type:   Image     Comment:   External Document

## 2010-07-26 NOTE — Progress Notes (Signed)
Summary: Req for apt  Phone Note Call from Patient Call back at Home Phone [phone]   Summary of Call: Pt says that she has been sick x3 wks. Please look at 3 previous phone notes. Will you work her in this week, she only has transportation Tuesdays & Fridays Initial call taken by: Lamar Sprinkles,  June 10, 2007 1:11 PM  Follow-up for Phone Call        Fri 3:45 Follow-up by: Tresa Garter MD,  June 10, 2007 9:43 PM  Additional Follow-up for Phone Call Additional follow up Details #1::        Phone Call Completed, pt declined, stated she will be picking up her records and going to see another MD Additional Follow-up by: Jonnie Finner,  June 11, 2007 8:45 AM

## 2010-07-26 NOTE — Assessment & Plan Note (Signed)
Summary: 2 mos f/u #/cd   Vital Signs:  Patient profile:   55 year old female Weight:      178 pounds Temp:     97.9 degrees F oral Pulse rate:   87 / minute BP sitting:   104 / 50  (left arm)  Vitals Entered By: Tora Perches (August 19, 2009 9:31 AM) CC: f/u Is Patient Diabetic? No   Primary Care Provider:  Tresa Garter MD  CC:  f/u.  History of Present Illness: The patient presents for a follow up of siezures  back pain, anxiety, depression and headaches. C/o growth next to R eye  Current Medications (verified): 1)  Synthroid 150 Mcg  Tabs (Levothyroxine Sodium) .... Once Daily` 2)  Dilantin 100 Mg  Caps (Phenytoin Sodium Extended) .[name].. 100 in Am and and 200 in Pm 3)  Xanax 2 Mg  Tabs (Alprazolam) .... Take One Tablet By Mouth 2-3 Tmes Daily As Needed For Anxiety 4)  Rocaltrol 0.25 Mcg  Caps (Calcitriol) .... Once Daily 5)  Trazodone Hcl 100 Mg Tabs (Trazodone Hcl) .... 2 By Mouth Qhs 6)  Paroxetine Hcl 40 Mg  Tabs (Paroxetine Hcl) .[name].. 1 By Mouth Qam 7)  Hydrocodone-Acetaminophen 10-325 Mg  Tabs (Hydrocodone-Acetaminophen) .[name].. 1 Q 4h As Needed By Mouth 8)  Albuterol Sulfate (2.5 Mg/79ml) 0.083%  Nebu (Albuterol Sulfate) .... Qid 9)  Vitamin D3 1000 Unit  Tabs (Cholecalciferol) .[name].. 1 Qd 10)  Miralax   Powd (Polyethylene Glycol 3350) .[name].. 1 Dose (17g) Dissolved in Water Two Times A Day 11)  Hydroxyzine Hcl 50 Mg Tabs (Hydroxyzine Hcl) .[name].. 1-2 By Mouth At Bedtime As Needed Insomnia 12)  Neurontin 100 Mg Caps (Gabapentin) .[name].. 1 By Mouth Three Times A Day For Pain 13)  Imitrex 100 Mg Tabs (Sumatriptan Succinate) .[name].. 1 By Mouth At The Onset of Migraine. May Repeat Dose in One Hour If Headache Not Resolved 14)  Dulcolax 5 Mg Tbec (Bisacodyl) .... As Needed For Constipation  Allergies: 1)  ! Pcn 2)  ! Oxycontin (Oxycodone Hcl) 3)  ! Lipitor 4)  Morphine 5)  * Oxycodone 6)  * Duragesic/fentanyl Patch  Past History:  Past Medical History: Last updated:  06/11/2009 Lung cancer, hx of COPD Low back pain Osteoarthritis  B knees Seizure disorder Hypothyroidism Hip avasc. necrosis L 2007 Anxiety Depression Cerebrovascular accident, hx of 89 Colitis 88 Hyperlipidemia Esophageal Ulcer 04/2009 EGD  Social History: Last updated: 06/11/2009 Occupation: disabled Married 2 childern Current Smoker Regular exercise-no Alcohol Use - no  Review of Systems       The patient complains of dyspnea on exertion, severe indigestion/heartburn, muscle weakness, difficulty walking, and depression.  The patient denies anorexia, fever, weight loss, weight gain, vision loss, and chest pain.    Physical Exam  General:  less sleepy and sedated Chronically ill-appearing  Eyes:  No corneal or conjunctival inflammation noted. EOMI. Perrla.  Ears:  External ear exam shows no significant lesions or deformities.  Otoscopic examination reveals clear canals, tympanic membranes are intact bilaterally without bulging, retraction, inflammation or discharge. Hearing is grossly normal bilaterally. Nose:  WNL Mouth:  Not dry Neck:  No deformities, masses, or tenderness noted. Lungs:  Normal respiratory effort, chest expands symmetrically. Lungs with wheezes. Heart:  Normal rate and regular rhythm. S1 and S2 normal without gallop, murmur, click, rub or other extra sounds. Abdomen:  Bowel sounds positive,abdomen soft and non-tender without masses, organomegaly or hernias noted. Msk:  NT spine,  she is in  a w/c Neurologic:  alert , not confused, able to talk much better.   Skin:  lat R lower eyelid polypoid growth Psych:    flat affect and subdued.     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   Problem # 2:  HYPERLIPIDEMIA (ICD-272.4) Assessment: Unchanged Statin intol The labs were reviewed with the patient.   Problem # 3:  FATIGUE  (ICD-780.79) Assessment: Unchanged  Problem # 4:  HYPOTENSION (ICD-458.9) Assessment: Comment Only  Problem # 5:  HYPOTHYROIDISM (ICD-244.9) Assessment: Unchanged  Her updated medication list for this problem includes:    Synthroid 150 Mcg Tabs (Levothyroxine sodium) ..... Once daily` The labs were reviewed with the patient.   Problem # 6:  LOW BACK PAIN (ICD-724.2) Assessment: Unchanged  Her updated medication list for this problem includes:    Hydrocodone-acetaminophen 10-325 Mg Tabs (Hydrocodone-acetaminophen) .[name]... 1 q 4h as needed by mouth  Complete Medication List: 1)  Synthroid 150 Mcg Tabs (Levothyroxine sodium) .... Once daily` 2)  Dilantin 100 Mg Caps (Phenytoin sodium extended) .[name].. 100 in am and and 200 in pm 3)  Xanax 2 Mg Tabs (Alprazolam) .... Take one tablet by mouth 2-3 tmes daily as needed for anxiety 4)  Rocaltrol 0.25 Mcg Caps (Calcitriol) .... Once daily 5)  Trazodone Hcl 100 Mg Tabs (Trazodone hcl) .... 2 by mouth qhs 6)  Paroxetine Hcl 40 Mg Tabs (Paroxetine hcl) .[name].. 1 by mouth qam 7)  Hydrocodone-acetaminophen 10-325 Mg Tabs (Hydrocodone-acetaminophen) .[name].. 1 q 4h as needed by mouth 8)  Albuterol Sulfate (2.5 Mg/31ml) 0.083% Nebu (Albuterol sulfate) .... Qid 9)  Vitamin D3 1000 Unit Tabs (Cholecalciferol) .[name].. 1 qd 10)  Miralax Powd (Polyethylene glycol 3350) .[name].. 1 dose (17g) dissolved in water two times a day 11)  Hydroxyzine Hcl 50 Mg Tabs (Hydroxyzine hcl) .[name].. 1-2 by mouth at bedtime as needed insomnia 12)  Neurontin 100 Mg Caps (Gabapentin) .[name].. 1 by mouth three times a day for pain 13)  Imitrex 100 Mg Tabs (Sumatriptan succinate) .[name].. 1 by mouth at the onset of migraine. may repeat dose in one hour if headache not resolved 14)  Dulcolax 5 Mg Tbec (Bisacodyl) .... As needed for constipation 15)  Hhn  .... As dirr  Patient Instructions: 1)  Skin biopsy w/me when convinient 2)  Please schedule a follow-up appointment in 2 months. Prescriptions: HHN  as dirr  #1 x 0   Entered and Authorized by:   Tresa Garter MD   Signed by:   Tresa Garter MD on 08/19/2009   Method used:   Print then Give to Patient   RxID:   [id number] ALBUTEROL SULFATE (2.5 MG/3ML) 0.083%  NEBU (ALBUTEROL SULFATE) qid  #120 x 3   Entered and Authorized by:   Tresa Garter MD   Signed by:   Tresa Garter MD on 08/19/2009   Method used:   Print then Give to Patient   RxID:   [id number] IMITREX 100 MG TABS (SUMATRIPTAN SUCCINATE) 1 by mouth at the onset of migraine. May repeat dose in one hour if headache not resolved  #36 x 3   Entered and Authorized by:   Tresa Garter MD   Signed by:   Tresa Garter MD on 08/19/2009   Method used:   Print then Give to Patient   RxID:   [id number] MIRALAX   POWD (POLYETHYLENE GLYCOL 3350)  1 dose (17g) dissolved in water two times a day  #3 mo x 3   Entered and Authorized by:   Tresa Garter MD   Signed by:   Tresa Garter MD on 08/19/2009   Method used:   Print then Give to Patient   RxID:   [id number] NEURONTIN 100 MG CAPS (GABAPENTIN) 1 by mouth three times a day for pain  #270 x 1   Entered and Authorized by:   Tresa Garter MD   Signed by:   Tresa Garter MD on 08/19/2009   Method used:   Print then Give to Patient   RxID:   [id number] HYDROXYZINE HCL 50 MG TABS (HYDROXYZINE HCL) 1-2 by mouth at bedtime as needed insomnia  #180 x 3   Entered and Authorized by:   Tresa Garter MD   Signed by:   Tresa Garter MD on 08/19/2009   Method used:   Print then Give to Patient   RxID:   [id number] HYDROCODONE-ACETAMINOPHEN 10-325 MG  TABS (HYDROCODONE-ACETAMINOPHEN) 1 q 4h as needed by mouth  #360 x 1   Entered and Authorized by:   Tresa Garter MD   Signed by:   Tresa Garter MD on 08/19/2009   Method used:   Print then Give to Patient   RxID:   [id number] PAROXETINE HCL 40 MG  TABS (PAROXETINE HCL) 1 by  mouth qam  #90 x 3   Entered and Authorized by:   Tresa Garter MD   Signed by:   Tresa Garter MD on 08/19/2009   Method used:   Print then Give to Patient   RxID:   [id number] TRAZODONE HCL 100 MG TABS (TRAZODONE HCL) 2 by mouth qhs  #180 x 3   Entered and Authorized by:   Tresa Garter MD   Signed by:   Tresa Garter MD on 08/19/2009   Method used:   Print then Give to Patient   RxID:   [id number] XANAX 2 MG  TABS (ALPRAZOLAM) Take one tablet by mouth 2-3 tmes daily as needed for anxiety  #270 x 1   Entered and Authorized by:   Tresa Garter MD   Signed by:   Tresa Garter MD on 08/19/2009   Method used:   Print then Give to Patient   RxID:   [id number] ROCALTROL 0.25 MCG  CAPS (CALCITRIOL) once daily  #90 x 3   Entered and Authorized by:   Tresa Garter MD   Signed by:   Tresa Garter MD on 08/19/2009   Method used:   Print then Give to Patient   RxID:   [id number] DILANTIN 100 MG  CAPS (PHENYTOIN SODIUM EXTENDED) 100 in am and and 200 in pm  #270 x 3   Entered and Authorized by:   Tresa Garter MD   Signed by:   Tresa Garter MD on 08/19/2009   Method used:   Print then Give to Patient   RxID:   [id number] SYNTHROID 150 MCG  TABS (LEVOTHYROXINE SODIUM) once daily` Brand medically necessary #90 x 3   Entered and Authorized by:   Tresa Garter MD   Signed by:   Tresa Garter MD on 08/19/2009   Method used:   Print then Give to Patient   RxID:   [id number]

## 2010-07-26 NOTE — Miscellaneous (Signed)
  Clinical Lists Changes  Medications: Rx of PAROXETINE HCL 40 MG  TABS (PAROXETINE HCL) 1 by mouth qam;  #90 x 3;  Signed;  Entered by: Tresa Garter MD;  Authorized by: Tresa Garter MD;  Method used: Electronically to [facility] [address], [place], [place], [place]  [address], Ph: [phone], Fax: [phone] Rx of XANAX 2 MG  TABS (ALPRAZOLAM) Take one tablet by mouth 2-3 tmes daily as needed for anxiety;  #90 x 6;  Signed;  Entered by: Tresa Garter MD;  Authorized by: Tresa Garter MD;  Method used: Print then Give to Patient    Prescriptions: XANAX 2 MG  TABS (ALPRAZOLAM) Take one tablet by mouth 2-3 tmes daily as needed for anxiety  #90 x 6   Entered and Authorized by:   Tresa Garter MD   Signed by:   Tresa Garter MD on 07/23/2008   Method used:   Print then Give to Patient   RxID:   [id number] PAROXETINE HCL 40 MG  TABS (PAROXETINE HCL) 1 by mouth qam  #90 x 3   Entered and Authorized by:   Tresa Garter MD   Signed by:   Tresa Garter MD on 07/23/2008   Method used:   Electronically to        [facility] (retail)       [address]       [place], [place]  [address]       Ph: [phone]       Fax: [phone]   RxID:   [id number]

## 2010-07-26 NOTE — Assessment & Plan Note (Signed)
Summary: F/U APPT,GIVE COL PREP INSTRUCTIONS...LSW.   History of Present Illness Visit Type: Follow-up Visit Primary GI MD: Stan Head MD [facility] Primary Provider: Sonda Primes, MD Requesting Provider: n/a Chief Complaint: f/u egd and abdominal pain. History of Present Illness:   Patient states that she is still having mid-abdominal pain at times as well as some problems with constipation. She denies any rectal bleeding or dark black stools. 3/3 visit rx Levsin SL and MiraLax once daily She moves bowels about every 3 days, an improvement. There will be some some liquid bowel movements in between.    GI Review of Systems    Reports abdominal pain, acid reflux, belching, bloating, and  dysphagia with solids.     Location of  Abdominal pain: mid-abdomen.    Denies chest pain, dysphagia with liquids, heartburn, loss of appetite, nausea, vomiting, vomiting blood, weight loss, and  weight gain.      Reports constipation.     Denies anal fissure, black tarry stools, change in bowel habit, diarrhea, diverticulosis, fecal incontinence, heme positive stool, hemorrhoids, irritable bowel syndrome, jaundice, light color stool, liver problems, rectal bleeding, and  rectal pain.    Current Medications (verified): 1)  Synthroid 150 Mcg  Tabs (Levothyroxine Sodium) .... Once Daily` 2)  Dilantin 100 Mg  Caps (Phenytoin Sodium Extended) .[name].. 100 in Am and and 200 in Pm 3)  Xanax 2 Mg  Tabs (Alprazolam) .... Take One Tablet By Mouth 2-3 Tmes Daily As Needed For Anxiety 4)  Rocaltrol 0.25 Mcg  Caps (Calcitriol) .... Once Daily 5)  Trazodone Hcl 100 Mg Tabs (Trazodone Hcl) .... 2 By Mouth At Bedtime 6)  Paroxetine Hcl 40 Mg  Tabs (Paroxetine Hcl) .[name].. 1 By Mouth Every Morning 7)  Hydrocodone-Acetaminophen 10-325 Mg  Tabs (Hydrocodone-Acetaminophen) .[name].. 1 Q 4h As Needed By Mouth 8)  Albuterol Sulfate (2.5 Mg/2ml) 0.083%  Nebu (Albuterol Sulfate) .... Four Times Daily 9)  Vitamin D3 1000 Unit  Tabs  (Cholecalciferol) .... One Tablet By Mouth Once Daily 10)  Miralax   Powd (Polyethylene Glycol 3350) .[name].. 1 Dose (17g) Dissolved in Water Two Times A Day 11)  Hydroxyzine Hcl 50 Mg Tabs (Hydroxyzine Hcl) .[name].. 1-2 By Mouth At Bedtime As Needed Insomnia 12)  Neurontin 100 Mg Caps (Gabapentin) .[name].. 1 By Mouth Three Times A Day For Pain 13)  Imitrex 100 Mg Tabs (Sumatriptan Succinate) .[name].. 1 By Mouth At The Onset of Migraine. May Repeat Dose in One Hour If Headache Not Resolved 14)  Dulcolax 5 Mg Tbec (Bisacodyl) .... As Needed For Constipation 15)  Hhn .... As Dirr 16)  Hyoscyamine Sulfate 0.125 Mg Subl (Hyoscyamine Sulfate) .[name].. 1-2 Under Tongue Every 4 Hours As Needed For Abdomnal Pain  Allergies (verified): 1)  ! Pcn 2)  ! Oxycontin (Oxycodone Hcl) 3)  ! Lipitor 4)  Morphine 5)  * Oxycodone 6)  * Duragesic/fentanyl Patch  Past History:  Past Medical History: Reviewed history from 08/26/2009 and no changes required. Lung cancer, hx of COPD Low back pain Osteoarthritis  B knees Seizure disorder Hypothyroidism Hip avasc. necrosis L 2007 Anxiety Depression Cerebrovascular accident, hx of 79 ? Collagenous colitis Hyperlipidemia Esophageal Ulcer 04/2009 EGD Cronic abdominal pain and constipation  Past Surgical History: Total hip replacement L Appendectomy Hysterectomy Exploratory Abdominal Surgery Cholecystectomy  Family History: Family History of CAD Female 1st degree relative <60: Mother, Grandmother, Grandfather Family History Diabetes 1st degree relative: Son, Mother No FH of Colon Cancer: Family History of Kidney Disease: Aunt  Social History: Occupation: disabled Married 2 childern Current Smoker-1 ppd Regular exercise-no Alcohol Use - no  Vital Signs:  Patient profile:   55 year old female Height:      65 inches Weight:      176.13 pounds BMI:     29.42 BSA:     1.88 Pulse rate:   80 / minute Pulse rhythm:   regular BP sitting:   100 / 52  (left  arm)  Vitals Entered By: Hortense Ramal CMA Duncan Dull) (October 07, 2009 11:16 AM)  Physical Exam  General:  Chronically ill-appearing  Abdomen:  softm mildly tender llq Rectal:  female staff present mildly tender no impaction or masses   Impression & Recommendations:  Problem # 1:  CONSTIPATION, CHRONIC (ICD-564.09) Assessment Unchanged she is probaly not completely compliant with therapy. still think neuropathy issues are main problem. Needs colonoscopy for polyp surveillance and screening will try MiraLax purge to help empty  (? high impaction based upon and then hopefully will do better with regular MiraLax Orders: Colonoscopy (Colon)  Problem # 2:  COLONIC POLYPS, ADENOMATOUS, HX OF (ICD-V12.72) Assessment: Unchanged Orders: Colonoscopy (Colon) - surveillance and screening  Problem # 3:  EPIGASTRIC PAIN (ICD-789.06) Assessment: Unchanged likely neuropathic and motility related await colonoscopy results prior to any other evaluation  Problem # 4:  SCREENING, COLON CANCER (ICD-V76.51) Assessment: Comment Only  Patient Instructions: 1)  Take 4 Dulcolax or Senokot tablets with a large glass of water. One (1) hour later drink Miralax 1 dose (1 packet or 1 tablespoon in 8 oz clear lquid) 4-6  times in 2-3 hours.  2)  Hyoscyamine has been discontinued since it did not help you. 3)  Call us back with the results of your colon purge (#1) 4)  Colonoscopy and Flexible Sigmoidoscopy brochure given.  5)  Conscious Sedation brochure given.  6)  Copy sent to : Sonda Primes, MD 7)  The medication list was reviewed and reconciled.  All changed / newly prescribed medications were explained.  A complete medication list was provided to the patient / caregiver. Prescriptions: MOVIPREP 100 GM  SOLR (PEG-KCL-NACL-NASULF-NA ASC-C) As per prep instructions.  #1 x 0   Entered by:   Harlow Mares CMA (AAMA)   Authorized by:   Iva Boop MD, [facility]   Signed by:   Harlow Mares CMA (AAMA) on  10/07/2009   Method used:   Electronically to        [facility].* (retail)       [address]       [place], [place]  [address]       Ph: [phone]       Fax: [phone]   RxID:   [id number]

## 2010-07-28 NOTE — Assessment & Plan Note (Signed)
Summary: 4 MO ROV / WANTED TO COME ON SAME DAY/NWS   Vital Signs:  Patient profile:   55 year old female Height:      65 inches Weight:      180 pounds BMI:     30.06 Temp:     97.8 degrees F oral Pulse rate:   88 / minute Pulse rhythm:   regular Resp:     16 per minute BP sitting:   104 / 60  (left arm) Cuff size:   regular  Vitals Entered By: Lanier Prude, CMA(AAMA) (June 09, 2010 10:29 AM) CC: 4 mo f/u  Is Patient Diabetic? No   Primary Care Darryn Kydd:  Sonda Primes, MD  CC:  4 mo f/u .  History of Present Illness: The patient presents for a follow up of back pain, seizures, anxiety, depression and headaches. C/o HH and GERD w/bloating. C/o food getting stuch   Current Medications (verified): 1)  Synthroid 150 Mcg  Tabs (Levothyroxine Sodium) .... Once Daily` 2)  Dilantin 100 Mg  Caps (Phenytoin Sodium Extended) .[name].. 100 in Am and and 200 in Pm 3)  Xanax 2 Mg  Tabs (Alprazolam) .... Take One Tablet By Mouth 2-3 Tmes Daily As Needed For Anxiety 4)  Rocaltrol 0.25 Mcg  Caps (Calcitriol) .... Once Daily 5)  Trazodone Hcl 100 Mg Tabs (Trazodone Hcl) .... 2 By Mouth At Bedtime 6)  Paroxetine Hcl 40 Mg  Tabs (Paroxetine Hcl) .[name].. 1 By Mouth Every Morning 7)  Hydrocodone-Acetaminophen 10-325 Mg  Tabs (Hydrocodone-Acetaminophen) .[name].. 1 Q 4h As Needed By Mouth 8)  Albuterol Sulfate (2.5 Mg/60ml) 0.083%  Nebu (Albuterol Sulfate) .... Four Times Daily 9)  Vitamin D3 1000 Unit  Tabs (Cholecalciferol) .... One Tablet By Mouth Once Daily 10)  Miralax   Powd (Polyethylene Glycol 3350) .[name].. 1 Dose (17g) Dissolved in Water Two Times A Day 11)  Hydroxyzine Hcl 50 Mg Tabs (Hydroxyzine Hcl) .[name].. 1-2 By Mouth At Bedtime As Needed Insomnia 12)  Neurontin 100 Mg Caps (Gabapentin) .[name].. 1 By Mouth Three Times A Day For Pain 13)  Dulcolax 5 Mg Tbec (Bisacodyl) .... As Needed For Constipation 14)  Hhn .... As Dirr 15)  Zomig Zmt 5 Mg Tbdp (Zolmitriptan) .[name].. 1 By Mouth Once Daily As Needed  Migraine  Allergies (verified): 1)  ! Pcn 2)  ! Oxycontin (Oxycodone Hcl) 3)  ! Lipitor 4)  Morphine 5)  * Oxycodone 6)  * Duragesic/fentanyl Patch  Past History:  Past Medical History: Last updated: 08/26/2009 Lung cancer, hx of COPD Low back pain Osteoarthritis  B knees Seizure disorder Hypothyroidism Hip avasc. necrosis L 2007 Anxiety Depression Cerebrovascular accident, hx of 84 ? Collagenous colitis Hyperlipidemia Esophageal Ulcer 04/2009 EGD Cronic abdominal pain and constipation  Social History: Last updated: 10/07/2009 Occupation: disabled Married 2 childern Current Smoker-1 ppd Regular exercise-no Alcohol Use - no  Past Surgical History: Total hip replacement L Appendectomy Hysterectomy, complete 1992 Exploratory Abdominal Surgery Cholecystectomy EGD w/baloon x 2   Review of Systems  The patient denies fever, hoarseness, chest pain, and abdominal pain.         sleeps a lot  Physical Exam  General:  Alert, obese woman sitting in a wheelchair Head:  Normocephalic, atraumatic.   Nose:  no external deformity.   Mouth:  Not dry Neck:  supple, no masses, and no thyromegaly.   Lungs:  Patient coughing intermittently with congestion.  Normal respiratory effort with normal breath sounds.  Mild B wheezes. Heart:  normal rate, regular rhythm, no murmur, no gallop, and no rub.   Abdomen:  soft, non-tender, no distention, no masses, and no guarding.   Msk:  Lumbar-sacral spine is tender to palpation over paraspinal muscles and painfull with the ROM     she is in a w/c Neurologic:  Alert and oriented x3.  Mild facial asymmetry with flattening of the right nasolabial fold.  EOM exam reveals reduced abduction of the right eye.  Eye raise symmetrical, smile mildly asymmetrical.  Gross hearing loss in right ear.  CN IX, XI, XII intact.  Strength 5/5 in bilateral upper and lower extremities. Skin:  3 mm growth R lower eyelid  Psych:    flat affect and subdued.      Impression & Recommendations:  Problem # 1:  DYSPHAGIA UNSPECIFIED (ICD-787.20) - poss stricture Assessment New  Orders: Gastroenterology Referral (GI) Dr Leone Payor  Problem # 2:  SOMNOLENCE (ICD-780.09) due to meds and other issues Assessment: Unchanged  Orders: TLB-BMP (Basic Metabolic Panel-BMET) (80048-METABOL) TLB-Hepatic/Liver Function Pnl (80076-HEPATIC) TLB-TSH (Thyroid Stimulating Hormone) (84443-TSH) TLB-CBC Platelet - w/Differential (85025-CBCD) T-Dilantin (Phenytoin) ([address]-60454)  Problem # 3:  HYPOTENSION (ICD-458.9) Assessment: Improved BP ok  Problem # 4:  LOW BACK PAIN (ICD-724.2) Assessment: Unchanged  Her updated medication list for this problem includes:    Hydrocodone-acetaminophen 10-325 Mg Tabs (Hydrocodone-acetaminophen) .[name]... 1 q 4h as needed by mouth  Orders: T-Dilantin (Phenytoin) (09811-91478) TLB-BMP (Basic Metabolic Panel-BMET) (80048-METABOL) TLB-Hepatic/Liver Function Pnl (80076-HEPATIC) TLB-TSH (Thyroid Stimulating Hormone) (84443-TSH) TLB-CBC Platelet - w/Differential (85025-CBCD)  Problem # 5:  CEREBROVASCULAR ACCIDENT, HX OF (ICD-V12.50) Assessment: Unchanged On the regimen of medicine(s) reflected in the chart    Problem # 6:  LUNG CANCER, HX OF (ICD-V10.11) Assessment: Unchanged  Problem # 7:  MINERALOCORTICOID DEFICIENCY (ICD-255.42) Assessment: Unchanged  Orders: T-Dilantin (Phenytoin) (29562-13086) TLB-BMP (Basic Metabolic Panel-BMET) (80048-METABOL) TLB-Hepatic/Liver Function Pnl (80076-HEPATIC) TLB-TSH (Thyroid Stimulating Hormone) (84443-TSH) TLB-CBC Platelet - w/Differential (85025-CBCD)  Complete Medication List: 1)  Synthroid 150 Mcg Tabs (Levothyroxine sodium) .... Once daily` 2)  Dilantin 100 Mg Caps (Phenytoin sodium extended) .[name].. 100 in am and and 200 in pm 3)  Xanax 2 Mg Tabs (Alprazolam) .... Take one tablet by mouth 2-3 tmes daily as needed for anxiety 4)  Rocaltrol 0.25 Mcg Caps (Calcitriol) ....  Once daily 5)  Trazodone Hcl 100 Mg Tabs (Trazodone hcl) .... 2 by mouth at bedtime 6)  Paroxetine Hcl 40 Mg Tabs (Paroxetine hcl) .[name].. 1 by mouth every morning 7)  Hydrocodone-acetaminophen 10-325 Mg Tabs (Hydrocodone-acetaminophen) .[name].. 1 q 4h as needed by mouth 8)  Albuterol Sulfate (2.5 Mg/7ml) 0.083% Nebu (Albuterol sulfate) .... Four times daily 9)  Miralax Powd (Polyethylene glycol 3350) .[name].. 1 dose (17g) dissolved in water two times a day 10)  Hydroxyzine Hcl 50 Mg Tabs (Hydroxyzine hcl) .[name].. 1-2 by mouth at bedtime as needed insomnia 11)  Neurontin 100 Mg Caps (Gabapentin) .[name].. 1 by mouth three times a day for pain 12)  Dulcolax 5 Mg Tbec (Bisacodyl) .... As needed for constipation 13)  Hhn  .... As dirr 14)  Zomig Zmt 5 Mg Tbdp (Zolmitriptan) .[name].. 1 by mouth once daily as needed migraine 15)  Ranitidine Hcl 300 Mg Tabs (Ranitidine hcl) .[name].. 1 by mouth once daily for indigestion  Patient Instructions: 1)  Please schedule a follow-up appointment in 3 months. 2)  Try Paroxetine 1/2 tab a day Prescriptions: RANITIDINE HCL 300 MG TABS (RANITIDINE HCL) 1 by mouth once daily for indigestion  #90 x 3  Entered and Authorized by:   Tresa Garter MD   Signed by:   Tresa Garter MD on 06/09/2010   Method used:   Print then Give to Patient   RxID:   [id number] ZOMIG ZMT 5 MG TBDP (ZOLMITRIPTAN) 1 by mouth once daily as needed migraine  #12 x 6   Entered and Authorized by:   Tresa Garter MD   Signed by:   Tresa Garter MD on 06/09/2010   Method used:   Print then Give to Patient   RxID:   [id number] NEURONTIN 100 MG CAPS (GABAPENTIN) 1 by mouth three times a day for pain  #270 x 1   Entered and Authorized by:   Tresa Garter MD   Signed by:   Tresa Garter MD on 06/09/2010   Method used:   Print then Give to Patient   RxID:   [id number] HYDROXYZINE HCL 50 MG TABS (HYDROXYZINE HCL) 1-2 by mouth at bedtime as needed insomnia  #180 x  3   Entered and Authorized by:   Tresa Garter MD   Signed by:   Tresa Garter MD on 06/09/2010   Method used:   Print then Give to Patient   RxID:   [id number] HYDROCODONE-ACETAMINOPHEN 10-325 MG  TABS (HYDROCODONE-ACETAMINOPHEN) 1 q 4h as needed by mouth  #360 x 1   Entered and Authorized by:   Tresa Garter MD   Signed by:   Tresa Garter MD on 06/09/2010   Method used:   Print then Give to Patient   RxID:   [id number] PAROXETINE HCL 40 MG  TABS (PAROXETINE HCL) 1 by mouth every morning  #90 x 1   Entered and Authorized by:   Tresa Garter MD   Signed by:   Tresa Garter MD on 06/09/2010   Method used:   Print then Give to Patient   RxID:   [id number] TRAZODONE HCL 100 MG TABS (TRAZODONE HCL) 2 by mouth at bedtime  #180 x 1   Entered and Authorized by:   Tresa Garter MD   Signed by:   Tresa Garter MD on 06/09/2010   Method used:   Print then Give to Patient   RxID:   [id number] ROCALTROL 0.25 MCG  CAPS (CALCITRIOL) once daily  #90 x 3   Entered and Authorized by:   Tresa Garter MD   Signed by:   Tresa Garter MD on 06/09/2010   Method used:   Print then Give to Patient   RxID:   [id number] Prudy Feeler 2 MG  TABS (ALPRAZOLAM) Take one tablet by mouth 2-3 tmes daily as needed for anxiety  #270 x 1   Entered and Authorized by:   Tresa Garter MD   Signed by:   Tresa Garter MD on 06/09/2010   Method used:   Print then Give to Patient   RxID:   [id number] DILANTIN 100 MG  CAPS (PHENYTOIN SODIUM EXTENDED) 100 in am and and 200 in pm  #270 x 3   Entered and Authorized by:   Tresa Garter MD   Signed by:   Tresa Garter MD on 06/09/2010   Method used:   Print then Give to Patient   RxID:   [id number] SYNTHROID 150 MCG  TABS (LEVOTHYROXINE SODIUM) once daily` Brand medically necessary #90 x 3   Entered and Authorized by:   Tresa Garter MD   Signed by:  Tresa Garter MD on 06/09/2010   Method used:   Print then Give to Patient   RxID:   [id number]    Orders Added: 1)  T-Dilantin (Phenytoin) [14782-95621] 2)  Est. Patient Level IV [30865] 3)  Gastroenterology Referral [GI] 4)  TLB-BMP (Basic Metabolic Panel-BMET) [80048-METABOL] 5)  TLB-Hepatic/Liver Function Pnl [80076-HEPATIC] 6)  TLB-TSH (Thyroid Stimulating Hormone) [84443-TSH] 7)  TLB-CBC Platelet - w/Differential [85025-CBCD]

## 2010-07-28 NOTE — Assessment & Plan Note (Signed)
Summary: cold/cough/bronchitis/cd   Vital Signs:  Patient profile:   55 year old female Height:      65 inches Weight:      182 pounds BMI:     30.40 Temp:     98.0 degrees F oral Pulse rate:   96 / minute Pulse rhythm:   regular Resp:     16 per minute BP sitting:   110 / 78  (left arm) Cuff size:   regular  Vitals Entered By: Lanier Prude, CMA(AAMA) (July 08, 2010 10:57 AM) CC: Lt side abd pain X 4 wks Is Patient Diabetic? No Comments per pt's husband she has been constipated until 2 days ago   Primary Care Provider:  Sonda Primes, MD  CC:  Lt side abd pain X 4 wks.  History of Present Illness: C/o abd pain x 2-4 wks after she started Prednisone taper for OA (Dr Charlann Boxer). The pain is constant, severe and it is worse after eating - nausea w/bloating. She stopped Pred after 2 wks. No diarrhea.   Current Medications (verified): 1)  Synthroid 150 Mcg  Tabs (Levothyroxine Sodium) .... Once Daily` 2)  Dilantin 100 Mg  Caps (Phenytoin Sodium Extended) .[name].. 100 in Am and and 200 in Pm 3)  Xanax 2 Mg  Tabs (Alprazolam) .... Take One Tablet By Mouth 2-3 Tmes Daily As Needed For Anxiety 4)  Rocaltrol 0.25 Mcg  Caps (Calcitriol) .... Once Daily 5)  Trazodone Hcl 100 Mg Tabs (Trazodone Hcl) .... 2 By Mouth At Bedtime 6)  Paroxetine Hcl 40 Mg  Tabs (Paroxetine Hcl) .[name].. 1 By Mouth Every Morning 7)  Hydrocodone-Acetaminophen 10-325 Mg  Tabs (Hydrocodone-Acetaminophen) .[name].. 1 Q 4h As Needed By Mouth 8)  Albuterol Sulfate (2.5 Mg/60ml) 0.083%  Nebu (Albuterol Sulfate) .... Four Times Daily 9)  Miralax   Powd (Polyethylene Glycol 3350) .[name].. 1 Dose (17g) Dissolved in Water Two Times A Day 10)  Hydroxyzine Hcl 50 Mg Tabs (Hydroxyzine Hcl) .[name].. 1-2 By Mouth At Bedtime As Needed Insomnia 11)  Neurontin 100 Mg Caps (Gabapentin) .[name].. 1 By Mouth Three Times A Day For Pain 12)  Dulcolax 5 Mg Tbec (Bisacodyl) .... As Needed For Constipation 13)  Hhn .... As Dirr 14)  Zomig Zmt 5 Mg Tbdp  (Zolmitriptan) .[name].. 1 By Mouth Once Daily As Needed Migraine 15)  Ranitidine Hcl 300 Mg Tabs (Ranitidine Hcl) .[name].. 1 By Mouth Once Daily For Indigestion  Allergies (verified): 1)  ! Pcn 2)  ! Oxycontin (Oxycodone Hcl) 3)  ! Lipitor 4)  Morphine 5)  * Oxycodone 6)  * Duragesic/fentanyl Patch  Past History:  Past Medical History: Last updated: 08/26/2009 Lung cancer, hx of COPD Low back pain Osteoarthritis  B knees Seizure disorder Hypothyroidism Hip avasc. necrosis L 2007 Anxiety Depression Cerebrovascular accident, hx of 40 ? Collagenous colitis Hyperlipidemia Esophageal Ulcer 04/2009 EGD Cronic abdominal pain and constipation  Social History: Last updated: 10/07/2009 Occupation: disabled Married 2 childern Current Smoker-1 ppd Regular exercise-no Alcohol Use - no  Review of Systems       The patient complains of dyspnea on exertion, abdominal pain, and difficulty walking.  The patient denies fever, weight loss, weight gain, chest pain, melena, hematochezia, and severe indigestion/heartburn.    Physical Exam  General:  Alert, obese woman sitting in a wheelchair Head:  Normocephalic, atraumatic.   Eyes:  vision grossly intact, pupils equal, pupils round, pupils reactive to light, no injection, no iris abnormalities, no optic disk abnormalities, and no retinal abnormalitiies.  Ears:  no external deformities.   Mouth:  Not dry Neck:  supple, no masses, and no thyromegaly.   Lungs:  Patient coughing intermittently with congestion.  Normal respiratory effort with normal breath sounds.  Mild B wheezes. Heart:  normal rate, regular rhythm, no murmur, no gallop, and no rub.   Abdomen:  soft, a little tender, mild distention, no masses, and no guarding.   Msk:  Lumbar-sacral spine is tender to palpation over paraspinal muscles and painfull with the ROM     she is in a w/c Extremities:  trace left pedal edema and trace right pedal edema.   Neurologic:  Alert and  oriented x3.  Mild facial asymmetry with flattening of the right nasolabial fold.  EOM exam reveals reduced abduction of the right eye.  Eye raise symmetrical, smile mildly asymmetrical.  Gross hearing loss in right ear.  CN IX, XI, XII intact.  Strength 5/5 in bilateral upper and lower extremities. Skin:  3 mm growth R lower eyelid  Cervical Nodes:  no anterior cervical adenopathy and no posterior cervical adenopathy.   Psych:    flat affect and subdued.     Impression & Recommendations:  Problem # 1:  ABDOMINAL PAIN, GENERALIZED (ICD-789.07) poss due to gastritis and constipation Assessment New See "Patient Instructions". ENEMAS. NEXIUM. To ER if worse Orders: T-1 View Abdomen (KUB) (74000TC) TLB-CBC Platelet - w/Differential (85025-CBCD) TLB-Hepatic/Liver Function Pnl (80076-HEPATIC) TLB-Lipase (83690-LIPASE) TLB-Sedimentation Rate (ESR) (85652-ESR) TLB-Udip ONLY (81003-UDIP) TLB-BMP (Basic Metabolic Panel-BMET) (80048-METABOL)  Problem # 2:  ABDOMINAL DISTENSION (ICD-787.3) related to #1 Assessment: New  Orders: T-1 View Abdomen (KUB) (74000TC) TLB-CBC Platelet - w/Differential (85025-CBCD) TLB-Hepatic/Liver Function Pnl (80076-HEPATIC) TLB-Lipase (83690-LIPASE) TLB-Sedimentation Rate (ESR) (85652-ESR) TLB-Udip ONLY (81003-UDIP) TLB-BMP (Basic Metabolic Panel-BMET) (80048-METABOL)  Problem # 3:  DYSPHAGIA UNSPECIFIED (ICD-787.20) Assessment: Unchanged On the regimen of medicine(s) reflected in the chart    Complete Medication List: 1)  Synthroid 150 Mcg Tabs (Levothyroxine sodium) .... Once daily` 2)  Dilantin 100 Mg Caps (Phenytoin sodium extended) .[name].. 100 in am and and 200 in pm 3)  Xanax 2 Mg Tabs (Alprazolam) .... Take one tablet by mouth 2-3 tmes daily as needed for anxiety 4)  Rocaltrol 0.25 Mcg Caps (Calcitriol) .... Once daily 5)  Trazodone Hcl 100 Mg Tabs (Trazodone hcl) .... 2 by mouth at bedtime 6)  Paroxetine Hcl 40 Mg Tabs (Paroxetine hcl) .[name].. 1 by mouth  every morning 7)  Hydrocodone-acetaminophen 10-325 Mg Tabs (Hydrocodone-acetaminophen) .[name].. 1 q 4h as needed by mouth 8)  Albuterol Sulfate (2.5 Mg/21ml) 0.083% Nebu (Albuterol sulfate) .... Four times daily 9)  Miralax Powd (Polyethylene glycol 3350) .[name].. 1 dose (17g) dissolved in water two times a day 10)  Hydroxyzine Hcl 50 Mg Tabs (Hydroxyzine hcl) .[name].. 1-2 by mouth at bedtime as needed insomnia 11)  Neurontin 100 Mg Caps (Gabapentin) .[name].. 1 by mouth three times a day for pain 12)  Dulcolax 5 Mg Tbec (Bisacodyl) .... As needed for constipation 13)  Hhn  .... As dirr 14)  Zomig Zmt 5 Mg Tbdp (Zolmitriptan) .[name].. 1 by mouth once daily as needed migraine 15)  Ranitidine Hcl 300 Mg Tabs (Ranitidine hcl) .[name].. 1 by mouth once daily for indigestion 16)  Nexium 40 Mg Cpdr (Esomeprazole magnesium) .[name].. 1 by mouth qam ( medically necessary) 17)  Promethazine Hcl 25 Mg Tabs (Promethazine hcl) .[name].. 1-2 by mouth four times a day as needed nausea  Patient Instructions: 1)  Use enema every other day or once daily  for stools 2)  Keep return office visit w/Dr Leone Payor 3)  Small meals  4)  Call if you are not better in a reasonable amount of time or if worse. Go to ER if feeling really bad!  Prescriptions: PROMETHAZINE HCL 25 MG TABS (PROMETHAZINE HCL) 1-2 by mouth four times a day as needed nausea  #60 x 1   Entered and Authorized by:   Tresa Garter MD   Signed by:   Tresa Garter MD on 07/08/2010   Method used:   Electronically to        [facility]  [address] [phone]* (retail)       [address]       [place], [place]  [address]       Ph: [phone] or [phone]       Fax: [phone]   RxID:   [id number] PROMETHAZINE HCL 25 MG TABS (PROMETHAZINE HCL) 1-2 by mouth four times a day as needed nausea  #60 x 1   Entered and Authorized by:   Tresa Garter MD   Signed by:   Tresa Garter MD on 07/08/2010   Method used:   Print then Give to Patient   RxID:    [id number] NEXIUM 40 MG CPDR (ESOMEPRAZOLE MAGNESIUM) 1 by mouth qam ( medically necessary)  #30 x 12   Entered and Authorized by:   Tresa Garter MD   Signed by:   Tresa Garter MD on 07/08/2010   Method used:   Print then Give to Patient   RxID:   [id number]    Orders Added: 1)  T-1 View Abdomen (KUB) [74000TC] 2)  TLB-CBC Platelet - w/Differential [85025-CBCD] 3)  TLB-Hepatic/Liver Function Pnl [80076-HEPATIC] 4)  TLB-Lipase [83690-LIPASE] 5)  TLB-Sedimentation Rate (ESR) [85652-ESR] 6)  TLB-Udip ONLY [81003-UDIP] 7)  TLB-BMP (Basic Metabolic Panel-BMET) [80048-METABOL] 8)  Est. Patient Level IV [60630]

## 2010-07-28 NOTE — Progress Notes (Signed)
Summary: Triage  Phone Note Call from Patient Call back at Home Phone [phone]   Caller: Patient Call For: Dr. Leone Payor Reason for Call: Talk to Nurse Summary of Call: Hernia, chest pain requesting sooner appt. than 07-21-10 Initial call taken by: Karna Christmas,  July 07, 2010 9:22 AM  Follow-up for Phone Call        Scheduled patient for visit with Dr. Leone Payor for 07/12/10 @4pm . Left message to call back Follow-up by: Selinda Michaels RN,  July 07, 2010 9:50 AM  Additional Follow-up for Phone Call Additional follow up Details #1::        Spoke with patient and she cannot come on Tuesdays. States she will keep her scheduled appointment of 07/21/10. Instructed patient that if she has chest pain she should go to the ER to be evaluated to make sure everything is ok. Additional Follow-up by: Selinda Michaels RN,  July 07, 2010 10:49 AM

## 2010-07-28 NOTE — Assessment & Plan Note (Signed)
Summary: DYSPHAGIA...AS.   History of Present Illness Visit Type: Follow-up Visit Primary GI MD: Stan Head MD [facility] Primary Provider: Sonda Primes, MD Requesting Provider: n/a Chief Complaint: dysphagia, hiatial hernia, BRB on tissue from straining with bwel movements History of Present Illness:   55 yo ww with chronic abdominal pain, GERD, dysphagia. She has multiple complaints. Intermittent bright red blood per rectum, streaked n stool and toilet and only with constipation and straining. Last event was last week. Moving bowels about 1x/week "good bowel movement", using MiraLax daily. Just started that daily last week.  She is more concerned about upper adoominal pain, especially after she eats. She is cutting her meat into very small pieces "or I get choked". Describes impact dysphagia sensation in suprasternal notch. She will usually regurgitate it out. About twice a week. Has been a chronic and recurrent issue. Abdominal pain has been chronic and recurrent but she says is worse and more epigastric.   GI Review of Systems    Reports abdominal pain, bloating, dysphagia with solids, nausea, and  vomiting.     Location of  Abdominal pain: epigastric area.    Denies acid reflux, belching, chest pain, dysphagia with liquids, heartburn, loss of appetite, vomiting blood, weight loss, and  weight gain.      Reports constipation and  rectal bleeding.     Denies anal fissure, black tarry stools, change in bowel habit, diarrhea, diverticulosis, fecal incontinence, heme positive stool, hemorrhoids, irritable bowel syndrome, jaundice, light color stool, liver problems, and  rectal pain.    Colonoscopy  Procedure date:  10/28/2009  Findings:          1) Five polyps removed, largest was 7 mm     2) Otherwise normal examination, excellent prep           3) Personal history of adenomatous colon polyp 2000.   Date Taken: 10/28/2009 Date Received: 10/28/2009   FINAL  DIAGNOSIS ***Microscopic Examination and Diagnosis***   1. COLON, POLYP(S), SIGMOID (2), CECUM AND TRANSVERSE (2) :    -  FOUR ADENOMATOUS POLYPS, TWO HYPERPLASTIC POLYPS AND A FRAGMENT OF BENIGN COLONIC Mucosa. -  no high grade dysplasia or invasive malignancy identified.  EGD  Procedure date:  07/08/2009  Findings:          1) Columnar mucosa, ? Barrett's in the distal esophagus     (islands) NOT BARRETT's     2) 3 cm hiatal hernia - sliding     3) Abnormal mucosa in the angulus - submucosal bulge - biopsied  Date Taken: 07/08/2009 Date Received: 07/08/2009   FINAL DIAGNOSIS ***Microscopic Examination and Diagnosis***   1. STOMACH, BIOPSY,  :    -  CHRONIC, ACTIVE GASTRITIS WITH HELICOBACTER PYLORI.NO DYSPLASIA OR MALIGNANCY IDENTIFIED. 2. ESOPHAGUS, BIOPSY, DISTAL :    -  FINDINGS CONSISTENT WITH GASTROESOPHAGEAL REFLUX.NO INTESTINAL METAPLASIA, DYSPLASIA OR MALIGNANCY IDENTIFIED.  EGD  Procedure date:  05/12/2009  Findings:          1) Ulcer in the distal esophagus, from reflux vs. food     impaction/regurgitation or both (suspect both)     2) 3 cm hiatal hernia     3) Otherwise normal examination, ? of stricture at GE junction     but no significant effect with 18 mm balloon inflation.   Current Medications (verified): 1)  Synthroid 150 Mcg  Tabs (Levothyroxine Sodium) .... Once Daily` 2)  Dilantin 100 Mg  Caps (Phenytoin Sodium Extended) .[name].. 100 in Am and and  200 in Pm 3)  Xanax 2 Mg  Tabs (Alprazolam) .... Take One Tablet By Mouth 2-3 Tmes Daily As Needed For Anxiety 4)  Rocaltrol 0.25 Mcg  Caps (Calcitriol) .... Once Daily 5)  Trazodone Hcl 100 Mg Tabs (Trazodone Hcl) .... 2 By Mouth At Bedtime 6)  Paroxetine Hcl 40 Mg  Tabs (Paroxetine Hcl) .[name].. 1 By Mouth Every Morning 7)  Hydrocodone-Acetaminophen 10-325 Mg  Tabs (Hydrocodone-Acetaminophen) .[name].. 1 Q 4h As Needed By Mouth 8)  Albuterol Sulfate (2.5 Mg/72ml) 0.083%  Nebu (Albuterol Sulfate) .... Four Times  Daily Patient Does Not Use! 9)  Miralax   Powd (Polyethylene Glycol 3350) .[name].. 1 Dose (17g) Dissolved in Water Two Times A Day 10)  Hydroxyzine Hcl 50 Mg Tabs (Hydroxyzine Hcl) .[name].. 1-2 By Mouth At Bedtime As Needed Insomnia 11)  Neurontin 100 Mg Caps (Gabapentin) .[name].. 1 By Mouth Three Times A Day For Pain 12)  Dulcolax 5 Mg Tbec (Bisacodyl) .... As Needed For Constipation 13)  Hhn .... As Dirr 14)  Zomig Zmt 5 Mg Tbdp (Zolmitriptan) .[name].. 1 By Mouth Once Daily As Needed Migraine 15)  Nexium 40 Mg Cpdr (Esomeprazole Magnesium) .[name].. 1 By Mouth Qam ( Medically Necessary) 16)  Promethazine Hcl 25 Mg Tabs (Promethazine Hcl) .[name].. 1-2 By Mouth Four Times A Day As Needed Nausea 17)  Oxygen .... Use At Night  Allergies (verified): 1)  ! Pcn 2)  ! Oxycontin (Oxycodone Hcl) 3)  ! Lipitor 4)  Morphine 5)  * Oxycodone 6)  * Duragesic/fentanyl Patch  Past History:  Past Medical History: Lung cancer, hx of COPD Low back pain Osteoarthritis  B knees Seizure disorder Hypothyroidism Hip avasc. necrosis L 2007 Anxiety Depression Cerebrovascular accident, hx of 26 ? Collagenous colitis Hyperlipidemia Esophageal Ulcer 04/2009 EGD Hekicobacter pylori gastritis 2010 (Pylera Tx) Chronic abdominal pain and constipation Colon adenomas 2011  Past Surgical History: Reviewed history from 06/09/2010 and no changes required. Total hip replacement L Appendectomy Hysterectomy, complete 1992 Exploratory Abdominal Surgery Cholecystectomy EGD w/baloon x 2   Family History: Reviewed history from 10/07/2009 and no changes required. Family History of CAD Female 1st degree relative <60: Mother, Grandmother, Grandfather Family History Diabetes 1st degree relative: Son, Mother No FH of Colon Cancer: Family History of Kidney Disease: Aunt  Social History: Reviewed history from 10/07/2009 and no changes required. Occupation: disabled Married 2 childern Current Smoker-1 ppd Regular  exercise-no Alcohol Use - no  Review of Systems       in wheelchair, limited mobility  Vital Signs:  Patient profile:   55 year old female Height:      65 inches Weight:      185.13 pounds BMI:     30.92 Pulse rate:   88 / minute Pulse rhythm:   regular BP sitting:   120 / 46  (left arm) Cuff size:   regular  Vitals Entered By: June McMurray CMA Duncan Dull) (July 21, 2010 11:01 AM)  Physical Exam  General:  Alert, obese woman sitting in a wheelchair Lungs:  Clear anterior Heart:  normal rate, regular rhythm, no murmur, no gallop, and no rub.   Abdomen:  obese and soft, diffusely tender to light touch which worsens with abdominal muscle tension (+ Carnett's sign) midline scar BS+ no bruits Rectal:  FEMALE STAFF PRESENT normal anoderm no masses normal tone scanty brown stool Psych:   flat affect and subdued.     Impression & Recommendations:  Problem # 1:  DYSPHAGIA UNSPECIFIED (ICD-787.20) Chronic and recurrent  without clear explanation on EGd and did not respond to duilation  Orders: Barium Swallow with Tablet (BS w/tab) after this try to give Nexium (new PPI) some time to work will probably require repeat EGD to evaluate for esophagitis and to follow-up submucosal nodule in stomach  Problem # 2:  GERD (ICD-530.81) Assessment: Deteriorated she has been switched to Nexium, hopefully that will help she could have poor gastric emptying also and if she has not had a gastric emptying study we may need to do that (narcotics must be contributing some)  Problem # 3:  ABDOMINAL PAIN, GENERALIZED (ICD-789.07) Assessment: Deteriorated She has abdominal wall pain - musculoskeletal this is chronic she must be straining and aggravating it with recent constipatuion ? if being in a wheelchair and pushing on wheels long-term is part of chronic pain here?  she has a submocosal lesion in stomach that could be contributing, hard to tell n hetr background of chronic  complaints eventual EGD, I suspect  Problem # 4:  CONSTIPATION, CHRONIC (ICD-564.09) Assessment: Deteriorated Has not complied with regular MiraLAx and needs to - started daily last week narcotics part of problem  Problem # 5:  RECTAL BLEEDING (ICD-569.3) Assessment: New no anorectal lesions on last year's colonoscopy with history of straining, bno mass on rectal and recent colonoscopy, would not work up and will expect this to improve with improved constipation.  Patient Instructions: 1)  You will go to [facility] now for your Barium swallow. 2)  We will call you in a few days for results and plan. 3)  The medication list was reviewed and reconciled.  All changed / newly prescribed medications were explained.  A complete medication list was provided to the patient / caregiver.

## 2010-08-03 NOTE — Progress Notes (Signed)
Summary: Test results  Phone Note Call from Patient Call back at Home Phone [phone]   Call For: Dr Leone Payor Reason for Call: Lab or Test Results Summary of Call: Returning Sheri's call from friday about her results. Initial call taken by: Leanor Kail [facility],  July 25, 2010 1:54 PM  Follow-up for Phone Call        Verified patient started taking Nexium on 07/08/10 per Dr Posey Rea. Informed her of EGD planned for 08/10/10- one month after starting Nexium per Dr Leone Payor. Previsit mailed to patient for 08/03/10 @ 1400. Patient stated understanding. Follow-up by: Graciella Freer RN,  July 25, 2010 3:24 PM

## 2010-08-03 NOTE — Letter (Signed)
Summary: Pre Visit Letter Revised  [facility]  [address] [place], [place] [address]   Phone: [phone]  Fax: [phone]        07/25/2010 MRN: [id number] Kaylee Hansen [address] [place], [place]  [address]             Procedure Date:  August 10, 2010  Welcome to the Gastroenterology Division at [facility].    You are scheduled to see a nurse for your pre-procedure visit on Feb. 8, 2012 at 2pm on the 3rd floor at [facility], [address]  We ask that you try to arrive at our office 15 minutes prior to your appointment time to allow for check-in.  Please take a minute to review the attached form.  If you answer "Yes" to one or more of the questions on the first page, we ask that you call the person listed at your earliest opportunity.  If you answer "No" to all of the questions, please complete the rest of the form and bring it to your appointment.    Your nurse visit will consist of discussing your medical and surgical history, your immediate family medical history, and your medications.   If you are unable to list all of your medications on the form, please bring the medication bottles to your appointment and we will list them.  We will need to be aware of both prescribed and over the counter drugs.  We will need to know exact dosage information as well.    Please be prepared to read and sign documents such as consent forms, a financial agreement, and acknowledgement forms.  If necessary, and with your consent, a friend or relative is welcome to sit-in on the nurse visit with you.  Please bring your insurance card so that we may make a copy of it.  If your insurance requires a referral to see a specialist, please bring your referral form from your primary care physician.  No co-pay is required for this nurse visit.     If you cannot keep your appointment, please call [phone] to cancel or reschedule prior to your appointment date.  This  allows [place] the opportunity to schedule an appointment for another patient in need of care.    Thank you for choosing [facility] for your medical needs.  We appreciate the opportunity to care for you.  Please visit [place] at our website  to learn more about our practice.  Sincerely, The Gastroenterology Division

## 2010-08-10 ENCOUNTER — Other Ambulatory Visit: Payer: Self-pay | Admitting: Internal Medicine

## 2010-08-17 NOTE — Letter (Signed)
Summary: [facility]   Imported By: Sherian Rein 08/10/2010 09:44:02  _____________________________________________________________________  External Attachment:    Type:   Image     Comment:   External Document

## 2010-08-18 ENCOUNTER — Encounter (INDEPENDENT_AMBULATORY_CARE_PROVIDER_SITE_OTHER): Payer: Self-pay | Admitting: *Deleted

## 2010-08-19 ENCOUNTER — Encounter: Payer: Self-pay | Admitting: Internal Medicine

## 2010-08-22 ENCOUNTER — Telehealth: Payer: Self-pay | Admitting: Internal Medicine

## 2010-08-23 NOTE — Miscellaneous (Signed)
Summary: endo on 3-8 @ 10am/ Dr Leone Payor  Clinical Lists Changes  Observations: Added new observation of ALLERGY REV: Done (08/19/2010 9:55)

## 2010-08-23 NOTE — Letter (Signed)
Summary: EGD Instructions  [facility]  [address] [place], [place] [address]   Phone: [phone]  Fax: [phone]       Kaylee Hansen    [birth year]    MRN: [id number]       Procedure Day /Date:  Thursday 09/01/10      Arrival Time: 9 am     Procedure Time: 10 am     Location of Procedure:                    _ X_ [facility] (4th Floor)  PREPARATION FOR ENDOSCOPY   On Thursday 09/01/10 THE DAY OF THE PROCEDURE:  1.   No solid foods, milk or milk products are allowed after midnight the night before your procedure.  2.   Do not drink anything colored red or purple.  Avoid juices with pulp.  No orange juice.  3.  You may drink clear liquids until 8 am, which is 2 hours before your procedure.                                                                                                CLEAR LIQUIDS INCLUDE: Water Jello Ice Popsicles Tea (sugar ok, no milk/cream) Powdered fruit flavored drinks Coffee (sugar ok, no milk/cream) Gatorade Juice: apple, white grape, white cranberry  Lemonade Clear bullion, consomm, broth Carbonated beverages (any kind) Strained chicken noodle soup Hard Candy   MEDICATION INSTRUCTIONS  Unless otherwise instructed, you should take regular prescription medications with a small sip of water as early as possible the morning of your procedure.               OTHER INSTRUCTIONS  You will need a responsible adult at least 57 years of age to accompany you and drive you home.   This person must remain in the waiting room during your procedure.  Wear loose fitting clothing that is easily removed.  Leave jewelry and other valuables at home.  However, you may wish to bring a book to read or an iPod/MP3 player to listen to music as you wait for your procedure to start.  Remove all body piercing jewelry and leave at home.  Total time from sign-in until discharge is approximately 2-3 hours.  You should go home  directly after your procedure and rest.  You can resume normal activities the day after your procedure.  The day of your procedure you should not:   Drive   Make legal decisions   Operate machinery   Drink alcohol   Return to work  You will receive specific instructions about eating, activities and medications before you leave.    The above instructions have been reviewed and explained to me by   Sherren Kerns RN  August 19, 2010 10:15 AM     I fully understand and can verbalize these instructions _____________________________ Date _________

## 2010-08-25 ENCOUNTER — Encounter: Payer: Self-pay | Admitting: Internal Medicine

## 2010-08-25 LAB — HM MAMMOGRAPHY: HM Mammogram: NORMAL

## 2010-08-31 ENCOUNTER — Encounter: Payer: Self-pay | Admitting: Internal Medicine

## 2010-09-01 ENCOUNTER — Other Ambulatory Visit (AMBULATORY_SURGERY_CENTER): Payer: 59 | Admitting: Internal Medicine

## 2010-09-01 ENCOUNTER — Encounter: Payer: Self-pay | Admitting: Internal Medicine

## 2010-09-01 DIAGNOSIS — R933 Abnormal findings on diagnostic imaging of other parts of digestive tract: Secondary | ICD-10-CM

## 2010-09-01 DIAGNOSIS — R109 Unspecified abdominal pain: Secondary | ICD-10-CM

## 2010-09-01 DIAGNOSIS — R1319 Other dysphagia: Secondary | ICD-10-CM

## 2010-09-01 DIAGNOSIS — K449 Diaphragmatic hernia without obstruction or gangrene: Secondary | ICD-10-CM

## 2010-09-01 NOTE — Progress Notes (Signed)
Summary: refill request  Phone Note From Pharmacy   Caller: [facility]  [address] [phone]* Call For: Alprazolam 2mg   Reason for Call: Needs renewal Summary of Call: Pharmacy requesting refill Alprazolam 2mg  #270x1 Initial call taken by: Burnard [facility]),  August 22, 2010 10:28 AM  Follow-up for Phone Call        ok x 1 Follow-up by: Tresa Garter MD,  August 22, 2010 6:35 PM    Prescriptions: Prudy Feeler 2 MG  TABS (ALPRAZOLAM) Take one tablet by mouth 2-3 tmes daily as needed for anxiety  #270 x 1   Entered by:   Lamar Sprinkles, CMA   Authorized by:   Jacques Navy MD   Signed by:   Lamar Sprinkles, CMA on 08/23/2010   Method used:   Telephoned to ...       [facility]  [address] [phone]* (retail)       [address]       [place], [place]  [address]       Ph: [phone] or [phone]       Fax: [phone]   RxID:   [id number]

## 2010-09-06 NOTE — Procedures (Addendum)
Summary: Upper Endoscopy  Patient: Kaylee Hansen Note: All result statuses are Final unless otherwise noted.  Tests: (1) Upper Endoscopy (EGD)   EGD Upper Endoscopy       DONE     [facility]     [address]     [place], [place]  [address]          ENDOSCOPY PROCEDURE REPORT          PATIENT:  Kaylee, Hansen  MR#:  [id number]     BIRTHDATE:  1955-07-13, 54 yrs. old  GENDER:  female          ENDOSCOPIST:  Iva Boop, MD, [facility]          PROCEDURE DATE:  09/01/2010     PROCEDURE:  EGD, diagnostic 43235, Maloney Dilation of Esophagus     ASA CLASS:  Class III     INDICATIONS:  dysphagia, abdominal pain Nexium started after     suggestion of esophagitis on Ba swallow. Dysphagia better but not     resolved.          MEDICATIONS:   Fentanyl 25 mcg IV, Versed 3 mg IV     TOPICAL ANESTHETIC:  Exactacain Spray          DESCRIPTION OF PROCEDURE:   After the risks benefits and     alternatives of the procedure were thoroughly explained, informed     consent was obtained.  The LB [id number]-H180 [id number] endoscope was     introduced through the mouth and advanced to the second portion of     the duodenum, without limitations.  The instrument was slowly     withdrawn as the mucosa was fully examined.     <<PROCEDUREIMAGES>>          A hiatal hernia was found. It was 2 - 3 cm in size. Sliding.     Abnormal appearing mucosa. Submucosal bulge on angulus. Looks     similar to prior exam.  Otherwise the examination was normal.     Retroflexed views revealed no abnormalities.    The scope was then     withdrawn from the patient and a 70 French dilator was passed     without difficulty or heme. The procedure was then completed.          COMPLICATIONS:  None          ENDOSCOPIC IMPRESSION:     1) 2 - 3 cm hiatal hernia     2) Abnormal mucosa in the stomach with persistent, unchanged     submucosal bulge on angulus.     3) Otherwise normal examination. 45 French maloney  dilation     preformed due to dysphagia.     RECOMMENDATIONS:     1) Clear liquids until 1 PM then soft foods today.     2) Normal consistency foods tomorrow if ok.     3) Dr. Marvell Fuller office will call back about possible further     work-up of problems.     4) Stay on Nexium - it has helped dysphagia also          Iva Boop, MD, [facility]          CC:  The Patient          n.     eSIGNED:   Iva Boop at 09/01/2010 11:41 AM          Tora Duck, [id number]  Note: An  exclamation mark (!) indicates a result that was not dispersed into the flowsheet. Document Creation Date: 09/01/2010 11:42 AM _______________________________________________________________________  (1) Order result status: Final Collection or observation date-time: 09/01/2010 11:31 Requested date-time:  Receipt date-time:  Reported date-time:  Referring Physician:   Ordering Physician: Stan Head [phone]) Specimen Source:  Source: Launa Grill Order Number: [id number] Lab site:   Appended Document: Upper Endoscopy not using her dentures all the time and was advised to do so as may be part of dysphagia problems will see about EUS vs. CT of stomach and submucsal bulge  Appended Document: Upper Endoscopy Baldo Ash I reviewed EGD images, reports.  I think EUS will be a helpful next step.  I will have patty take care of scheduling it if you would give Kaylee Hansen a heads up about it.  patty, can you schedule her for upper EUS, radial +/- linear, 60 min, next available EUS thursday. Please call her early next week to schedule.  Appended Document: Upper Endoscopy I will route this back once I or RN have spoken to her. thanks  Appended Document: Upper Endoscopy Sheri - let her know Dr. Christella Hartigan will do EUS to evaluate bulge in stomnach wall then forward to Patty to set up  Appended Document: Upper Endoscopy I spoke with the patient and she is aware of Dr Marvell Fuller recommendations and that she should  recieve a call from [facility] about scheduling  Appended Document: Orders Update/EUS pt scheduled for EUS 09/22/10 10 15  am [facility]   pt instructed and meds reviewed she will call with any questions Clinical Lists Changes  Problems: Added new problem of NONSPECIFIC ABN FINDING RAD & OTH EXAM GI TRACT (ICD-793.4) Orders: Added new Test order of EUS-Upper (EUS-Upper) - Signed

## 2010-09-06 NOTE — Miscellaneous (Signed)
Summary: mammogram 2012  Clinical Lists Changes  Observations: Added new observation of MAMMOGRAM: normal (08/25/2010 9:54)      Preventive Care Screening  Mammogram:    Date:  08/25/2010    Results:  normal

## 2010-09-08 ENCOUNTER — Ambulatory Visit: Payer: 59 | Admitting: Internal Medicine

## 2010-09-12 ENCOUNTER — Encounter (INDEPENDENT_AMBULATORY_CARE_PROVIDER_SITE_OTHER): Payer: Self-pay | Admitting: *Deleted

## 2010-09-12 DIAGNOSIS — R933 Abnormal findings on diagnostic imaging of other parts of digestive tract: Secondary | ICD-10-CM | POA: Insufficient documentation

## 2010-09-21 ENCOUNTER — Telehealth: Payer: Self-pay | Admitting: Gastroenterology

## 2010-09-21 NOTE — Telephone Encounter (Signed)
Pt had questions about her prep for tomorrow I reinstructed her and pt verbalized understanding.

## 2010-09-22 ENCOUNTER — Other Ambulatory Visit: Payer: Self-pay | Admitting: Gastroenterology

## 2010-09-22 ENCOUNTER — Ambulatory Visit (HOSPITAL_COMMUNITY)
Admission: RE | Admit: 2010-09-22 | Discharge: 2010-09-22 | Disposition: A | Discharge status: Home / Self Care | Payer: 59 | Source: Ambulatory Visit | Attending: Gastroenterology | Admitting: Gastroenterology

## 2010-09-22 ENCOUNTER — Telehealth: Payer: Self-pay

## 2010-09-22 ENCOUNTER — Encounter: Payer: 59 | Admitting: Gastroenterology

## 2010-09-22 ENCOUNTER — Encounter: Payer: Self-pay | Admitting: Internal Medicine

## 2010-09-22 DIAGNOSIS — R933 Abnormal findings on diagnostic imaging of other parts of digestive tract: Secondary | ICD-10-CM

## 2010-09-22 DIAGNOSIS — K319 Disease of stomach and duodenum, unspecified: Secondary | ICD-10-CM | POA: Insufficient documentation

## 2010-09-22 DIAGNOSIS — K3189 Other diseases of stomach and duodenum: Secondary | ICD-10-CM

## 2010-09-22 NOTE — Telephone Encounter (Signed)
Ok thanks She has sxs (? 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 Awaiting final cytology. Will need surgical resection  Beckey Polkowski, can you set her up with CT scan abd (IV and oral contrast) for gastric wall mass           Called pt she is agreeable to the CT scan I will call and schedule

## 2010-09-22 NOTE — Telephone Encounter (Signed)
Pt scheduled for CT at [place] [facility] on 09/29/10 Thurs 900 am arrive at 845 am  Nothing to eat or drink after 5 am and she will need to pick up contrast at our office to drink at 1 and 2 hours prior to the test.  Pt aware.

## 2010-09-22 NOTE — Letter (Signed)
Summary: EGD Instructions  [facility]  [address] [place], [place] [address]   Phone: [phone]  Fax: [phone]       Kaylee Hansen    04/25/1956    MRN: [id number]       Procedure Day /Date:09/22/10 [name]     Arrival Time: 915 am     Procedure Time:1015 am     Location of Procedure:                     X [facility] ( Outpatient Registration)    PREPARATION FOR ENDOSCOPY   On 09/22/10 THE DAY OF THE PROCEDURE:  1.   No solid foods, milk or milk products are allowed after midnight the night before your procedure.  2.   Do not drink anything colored red or purple.  Avoid juices with pulp.  No orange juice.  3.  You may drink clear liquids until 615 am , which is 4 hours before your procedure.                                                                                                CLEAR LIQUIDS INCLUDE: Water Jello Ice Popsicles Tea (sugar ok, no milk/cream) Powdered fruit flavored drinks Coffee (sugar ok, no milk/cream) Gatorade Juice: apple, white grape, white cranberry  Lemonade Clear bullion, consomm, broth Carbonated beverages (any kind) Strained chicken noodle soup Hard Candy   MEDICATION INSTRUCTIONS  Unless otherwise instructed, you should take regular prescription medications with a small sip of water as early as possible the morning of your procedure.               OTHER INSTRUCTIONS  You will need a responsible adult at least 57 years of age to accompany you and drive you home.   This person must remain in the waiting room during your procedure.  Wear loose fitting clothing that is easily removed.  Leave jewelry and other valuables at home.  However, you may wish to bring a book to read or an iPod/MP3 player to listen to music as you wait for your procedure to start.  Remove all body piercing jewelry and leave at home.  Total time from sign-in until discharge is approximately 2-3 hours.  You should go  home directly after your procedure and rest.  You can resume normal activities the day after your procedure.  The day of your procedure you should not:   Drive   Make legal decisions   Operate machinery   Drink alcohol   Return to work  You will receive specific instructions about eating, activities and medications before you leave.    The above instructions have been reviewed and explained to me by   Chales Abrahams CMA Duncan Dull)  September 12, 2010 10:14 AM     I fully understand and can verbalize these instructions over the phone mailed to home Date 09/12/10

## 2010-09-23 ENCOUNTER — Telehealth: Payer: Self-pay | Admitting: *Deleted

## 2010-09-23 MED ORDER — ESOMEPRAZOLE MAGNESIUM 40 MG PO CPDR: 40.0000 mg | DELAYED_RELEASE_CAPSULE | Freq: Every day | ORAL | Status: DC

## 2010-09-23 NOTE — Telephone Encounter (Signed)
Rf sent

## 2010-09-27 NOTE — Procedures (Signed)
Summary: Endoscopic Ultrasound  Patient: Kaylee Hansen Note: All result statuses are Final unless otherwise noted.  Tests: (1) Endoscopic Ultrasound (EUS)  EUS Endoscopic Ultrasound                             DONE     [facility]     [address] [place], [place]  [address]          ENDOSCOPIC ULTRASOUND PROCEDURE REPORT          PATIENT:  Kaylee Hansen, Kaylee Hansen  MR#:  [id number]     BIRTHDATE:  Dec 27, 1955  GENDER:  female     ENDOSCOPIST:  Rachael Fee, MD     REFERRED BY:  Iva Boop, M.D., [facility]     PROCEDURE DATE:  09/22/2010     PROCEDURE:  Upper EUS w/FNA     ASA CLASS:  Class II     INDICATIONS:  gastric subepithelial lesion noted on EGD recently,     also EGD 2011     MEDICATIONS:  Fentanyl 100 mcg IV, Versed 7 mg IV          DESCRIPTION OF PROCEDURE:   After the risks benefits and     alternatives of the procedure were  explained, informed consent     was obtained. The patient was then placed in the left, lateral,     decubitus postion and IV sedation was administered. Throughout the     procedure, the patient's blood pressure, pulse and oxygen     saturations were monitored continuously.  Under direct     visualization, the  endoscope was introduced through the mouth and     advanced to the second portion of the duodenum.  Water was used as     necessary to provide an acoustic interface.  Upon completion of     the imaging, water was removed and the patient was sent to the     recovery room in satisfactory condition.          <<PROCEDUREIMAGES>>     Endoscopic findings:     1. Subepithelial bulging of mucosa at gastric incisure, measuring     approximately 3-4cm across.     2. Otherwise essentially normal UGI tract (limited views with     echoendoscopes):          EUS findings:     1. The gastric lesion described above corresponded with a 2.7cm,     heterogeneous, predominantly hypoechoic lesion with indistinct     margins. This appeared to  communicate with muscularis propria     layer of stomach wall.   It was sampled with 2 passes of a 22     guage BS EUS FNA needle, color doppler to avoid signficant blood     vessels.     2. No perigastric adenopathy.     3. Limited views of pancreas, spleen, portal and splenic vessels     were all normal          Impression:     2.7cm subepithelial lesion in wall of stomach at gastric incisure     (this size is likely an underestimate of lesions actual size due     to imaging limitations). FNA preliminary cytology review shows     spindle cells. 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  My office will     arrange CT scan IV and PO contrast abdomen to get better idea of     localization of tumor, could make a difference with surgical     approach.  Await final cytology.          ______________________________     Rachael Fee, MD          n.     eSIGNED:   Rachael Fee at 09/22/2010 11:15 AM          Tora Duck, [id number]  Note: An exclamation mark (!) indicates a result that was not dispersed into the flowsheet. Document Creation Date: 09/22/2010 11:16 AM _______________________________________________________________________  (1) Order result status: Final Collection or observation date-time: 09/22/2010 10:57 Requested date-time:  Receipt date-time:  Reported date-time:  Referring Physician:   Ordering Physician: Rob Bunting [phone]) Specimen Source:  Source: Launa Grill Order Number: [id number] Lab site:

## 2010-09-28 LAB — COMPREHENSIVE METABOLIC PANEL
ALT: 20 U/L (ref 0–35)
ALT: 14 U/L (ref 0–35)
AST: 18 U/L (ref 0–37)
AST: 14 U/L (ref 0–37)
Albumin: 3.5 g/dL (ref 3.5–5.2)
Albumin: 2.8 g/dL — ABNORMAL LOW (ref 3.5–5.2)
Alkaline Phosphatase: 113 U/L (ref 39–117)
Alkaline Phosphatase: 86 U/L (ref 39–117)
BUN: 10 mg/dL (ref 6–23)
BUN: 9 mg/dL (ref 6–23)
CO2: 31 mEq/L (ref 19–32)
CO2: 25 mEq/L (ref 19–32)
Calcium: 9.8 mg/dL (ref 8.4–10.5)
Calcium: 8.4 mg/dL (ref 8.4–10.5)
Chloride: 107 mEq/L (ref 96–112)
Chloride: 110 mEq/L (ref 96–112)
Creatinine, Ser: 1 mg/dL (ref 0.4–1.2)
Creatinine, Ser: 0.86 mg/dL (ref 0.4–1.2)
GFR calc Af Amer: >60 mL/min (ref >60)
GFR calc Af Amer: >60 mL/min (ref >60)
GFR calc non Af Amer: 58 mL/min — ABNORMAL LOW (ref >60)
GFR calc non Af Amer: >60 mL/min (ref >60)
Glucose, Bld: 92 mg/dL (ref 70–99)
Glucose, Bld: 96 mg/dL (ref 70–99)
Potassium: 3.8 mEq/L (ref 3.5–5.1)
Potassium: 4 mEq/L (ref 3.5–5.1)
Sodium: 143 mEq/L (ref 135–145)
Sodium: 141 mEq/L (ref 135–145)
Total Bilirubin: 0.3 mg/dL (ref 0.3–1.2)
Total Bilirubin: 0.3 mg/dL (ref 0.3–1.2)
Total Protein: 7.1 g/dL (ref 6.0–8.3)
Total Protein: 5.7 g/dL — ABNORMAL LOW (ref 6.0–8.3)

## 2010-09-28 LAB — POCT I-STAT, CHEM 8
BUN: 12 mg/dL (ref 6–23)
Calcium, Ion: 1.28 mmol/L (ref 1.12–1.32)
Chloride: 106 mEq/L (ref 96–112)
Creatinine, Ser: 1 mg/dL (ref 0.4–1.2)
Glucose, Bld: 88 mg/dL (ref 70–99)
HCT: 40 % (ref 36.0–46.0)
Hemoglobin: 13.6 g/dL (ref 12.0–15.0)
Potassium: 3.8 mEq/L (ref 3.5–5.1)
Sodium: 143 mEq/L (ref 135–145)
TCO2: 29 mmol/L (ref 0–100)

## 2010-09-28 LAB — CBC
HCT: 37.9 % (ref 36.0–46.0)
HCT: 31.2 % — ABNORMAL LOW (ref 36.0–46.0)
HCT: 33.2 % — ABNORMAL LOW (ref 36.0–46.0)
Hemoglobin: 12.7 g/dL (ref 12.0–15.0)
Hemoglobin: 10.7 g/dL — ABNORMAL LOW (ref 12.0–15.0)
Hemoglobin: 11.3 g/dL — ABNORMAL LOW (ref 12.0–15.0)
MCHC: 33.6 g/dL (ref 30.0–36.0)
MCHC: 34.1 g/dL (ref 30.0–36.0)
MCHC: 34.2 g/dL (ref 30.0–36.0)
MCV: 98.8 fL (ref 78.0–100.0)
MCV: 100.1 fL — ABNORMAL HIGH (ref 78.0–100.0)
MCV: 99.8 fL (ref 78.0–100.0)
Platelets: 253 10*3/uL (ref 150–400)
Platelets: 216 10*3/uL (ref 150–400)
Platelets: 228 10*3/uL (ref 150–400)
RBC: 3.84 MIL/uL — ABNORMAL LOW (ref 3.87–5.11)
RBC: 3.12 MIL/uL — ABNORMAL LOW (ref 3.87–5.11)
RBC: 3.32 MIL/uL — ABNORMAL LOW (ref 3.87–5.11)
RDW: 14.6 % (ref 11.5–15.5)
RDW: 14.1 % (ref 11.5–15.5)
RDW: 14.2 % (ref 11.5–15.5)
WBC: 7.1 10*3/uL (ref 4.0–10.5)
WBC: 6.6 10*3/uL (ref 4.0–10.5)
WBC: 7 10*3/uL (ref 4.0–10.5)

## 2010-09-28 LAB — IRON AND TIBC
Iron: 93 ug/dL (ref 42–135)
Saturation Ratios: 39 % (ref 20–55)
TIBC: 236 ug/dL — ABNORMAL LOW (ref 250–470)
UIBC: 143 ug/dL

## 2010-09-28 LAB — POCT CARDIAC MARKERS
CKMB, poc: 1.2 ng/mL (ref 1.0–8.0)
Myoglobin, poc: 108 ng/mL (ref 12–200)
Troponin i, poc: <0.05 ng/mL (ref 0.00–0.09)

## 2010-09-28 LAB — CK TOTAL AND CKMB (NOT AT ARMC)
CK, MB: 1.1 ng/mL (ref 0.3–4.0)
Relative Index: INVALID (ref 0.0–2.5)
Total CK: 43 U/L (ref 7–177)

## 2010-09-28 LAB — CARDIAC PANEL(CRET KIN+CKTOT+MB+TROPI)
CK, MB: 1.3 ng/mL (ref 0.3–4.0)
CK, MB: 1.9 ng/mL (ref 0.3–4.0)
Relative Index: INVALID (ref 0.0–2.5)
Relative Index: INVALID (ref 0.0–2.5)
Total CK: 51 U/L (ref 7–177)
Total CK: 72 U/L (ref 7–177)
Troponin I: 0.02 ng/mL (ref 0.00–0.06)
Troponin I: 0.03 ng/mL (ref 0.00–0.06)

## 2010-09-28 LAB — TSH
TSH: 0.487 u[IU]/mL (ref 0.350–4.500)
TSH: 0.615 u[IU]/mL (ref 0.350–4.500)

## 2010-09-28 LAB — LIPID PANEL
Cholesterol: 307 mg/dL — ABNORMAL HIGH (ref 0–200)
HDL: 38 mg/dL — ABNORMAL LOW (ref >39)
LDL Cholesterol: 240 mg/dL — ABNORMAL HIGH (ref 0–99)
Total CHOL/HDL Ratio: 8.1 RATIO
Triglycerides: 145 mg/dL (ref <150)
VLDL: 29 mg/dL (ref 0–40)

## 2010-09-28 LAB — RAPID URINE DRUG SCREEN, HOSP PERFORMED
Amphetamines: POSITIVE — AB
Barbiturates: NOT DETECTED
Benzodiazepines: POSITIVE — AB
Cocaine: NOT DETECTED
Opiates: POSITIVE — AB
Tetrahydrocannabinol: NOT DETECTED

## 2010-09-28 LAB — DIFFERENTIAL
Basophils Absolute: 0.1 10*3/uL (ref 0.0–0.1)
Basophils Relative: 1 % (ref 0–1)
Eosinophils Absolute: 0.6 10*3/uL (ref 0.0–0.7)
Eosinophils Relative: 8 % — ABNORMAL HIGH (ref 0–5)
Lymphocytes Relative: 21 % (ref 12–46)
Lymphs Abs: 1.5 10*3/uL (ref 0.7–4.0)
Monocytes Absolute: 0.6 10*3/uL (ref 0.1–1.0)
Monocytes Relative: 8 % (ref 3–12)
Neutro Abs: 4.4 10*3/uL (ref 1.7–7.7)
Neutrophils Relative %: 62 % (ref 43–77)

## 2010-09-28 LAB — URINALYSIS, ROUTINE W REFLEX MICROSCOPIC
Glucose, UA: NEGATIVE mg/dL
Hgb urine dipstick: NEGATIVE
Ketones, ur: NEGATIVE mg/dL
Nitrite: NEGATIVE
Protein, ur: NEGATIVE mg/dL
Specific Gravity, Urine: 1.031 — ABNORMAL HIGH (ref 1.005–1.030)
Urobilinogen, UA: 0.2 mg/dL (ref 0.0–1.0)
pH: 5.5 (ref 5.0–8.0)

## 2010-09-28 LAB — VITAMIN B12: Vitamin B-12: 658 pg/mL (ref 211–911)

## 2010-09-28 LAB — GLUCOSE, CAPILLARY: Glucose-Capillary: 93 mg/dL (ref 70–99)

## 2010-09-28 LAB — RETICULOCYTES
RBC.: 3.41 MIL/uL — ABNORMAL LOW (ref 3.87–5.11)
Retic Count, Absolute: 51.2 10*3/uL (ref 19.0–186.0)
Retic Ct Pct: 1.5 % (ref 0.4–3.1)

## 2010-09-28 LAB — FOLATE RBC: RBC Folate: 982 ng/mL — ABNORMAL HIGH (ref 180–600)

## 2010-09-28 LAB — TROPONIN I: Troponin I: 0.02 ng/mL (ref 0.00–0.06)

## 2010-09-28 LAB — FERRITIN: Ferritin: 62 ng/mL (ref 10–291)

## 2010-09-28 LAB — BRAIN NATRIURETIC PEPTIDE: Pro B Natriuretic peptide (BNP): 47.5 pg/mL (ref 0.0–100.0)

## 2010-09-28 LAB — HOMOCYSTEINE: Homocysteine: 5.9 umol/L (ref 4.0–15.4)

## 2010-09-29 ENCOUNTER — Ambulatory Visit (INDEPENDENT_AMBULATORY_CARE_PROVIDER_SITE_OTHER)
Admit: 2010-09-29 | Discharge: 2010-09-29 | Disposition: A | Discharge status: Home / Self Care | Payer: 59 | Attending: Gastroenterology | Admitting: Gastroenterology

## 2010-09-29 DIAGNOSIS — K319 Disease of stomach and duodenum, unspecified: Secondary | ICD-10-CM

## 2010-09-29 DIAGNOSIS — K3189 Other diseases of stomach and duodenum: Secondary | ICD-10-CM

## 2010-09-29 HISTORY — DX: Personal history of other malignant neoplasm of bronchus and lung: Z85.118

## 2010-09-29 HISTORY — DX: Malignant (primary) neoplasm, unspecified: C80.1

## 2010-09-29 HISTORY — DX: Acquired absence of both cervix and uterus: Z90.710

## 2010-09-29 HISTORY — DX: Acquired absence of other specified parts of digestive tract: Z90.49

## 2010-09-29 MED ORDER — IOHEXOL 300 MG/ML  SOLN
100.0000 mL | Freq: Once | INTRAMUSCULAR | Status: AC | PRN
  Administered 2010-09-29: 100 mL via INTRAVENOUS

## 2010-10-06 LAB — DIFFERENTIAL
Basophils Absolute: 0.1 10*3/uL (ref 0.0–0.1)
Basophils Relative: 1 % (ref 0–1)
Eosinophils Absolute: 0 10*3/uL (ref 0.0–0.7)
Eosinophils Relative: 0 % (ref 0–5)
Lymphocytes Relative: 15 % (ref 12–46)
Lymphs Abs: 1.6 10*3/uL (ref 0.7–4.0)
Monocytes Absolute: 0.8 10*3/uL (ref 0.1–1.0)
Monocytes Relative: 7 % (ref 3–12)
Neutro Abs: 8.1 10*3/uL — ABNORMAL HIGH (ref 1.7–7.7)
Neutrophils Relative %: 76 % (ref 43–77)

## 2010-10-06 LAB — PHENYTOIN LEVEL, TOTAL
Phenytoin Lvl: 4.2 ug/mL — ABNORMAL LOW (ref 10.0–20.0)
Phenytoin Lvl: 4.7 ug/mL — ABNORMAL LOW (ref 10.0–20.0)
Phenytoin Lvl: <2.5 ug/mL — ABNORMAL LOW (ref 10.0–20.0)

## 2010-10-06 LAB — URINE CULTURE: Colony Count: 10000

## 2010-10-06 LAB — RAPID URINE DRUG SCREEN, HOSP PERFORMED
Amphetamines: NOT DETECTED
Barbiturates: NOT DETECTED
Benzodiazepines: POSITIVE — AB
Cocaine: NOT DETECTED
Opiates: POSITIVE — AB
Tetrahydrocannabinol: NOT DETECTED

## 2010-10-06 LAB — CBC
HCT: 31.1 % — ABNORMAL LOW (ref 36.0–46.0)
HCT: 35.3 % — ABNORMAL LOW (ref 36.0–46.0)
Hemoglobin: 10.8 g/dL — ABNORMAL LOW (ref 12.0–15.0)
Hemoglobin: 11.8 g/dL — ABNORMAL LOW (ref 12.0–15.0)
MCHC: 33.5 g/dL (ref 30.0–36.0)
MCHC: 34.7 g/dL (ref 30.0–36.0)
MCV: 98.2 fL (ref 78.0–100.0)
MCV: 98.9 fL (ref 78.0–100.0)
Platelets: 206 10*3/uL (ref 150–400)
Platelets: 255 10*3/uL (ref 150–400)
RBC: 3.17 MIL/uL — ABNORMAL LOW (ref 3.87–5.11)
RBC: 3.57 MIL/uL — ABNORMAL LOW (ref 3.87–5.11)
RDW: 13.4 % (ref 11.5–15.5)
RDW: 14.1 % (ref 11.5–15.5)
WBC: 10.6 10*3/uL — ABNORMAL HIGH (ref 4.0–10.5)
WBC: 8.3 10*3/uL (ref 4.0–10.5)

## 2010-10-06 LAB — URINALYSIS, ROUTINE W REFLEX MICROSCOPIC
Bilirubin Urine: NEGATIVE
Glucose, UA: NEGATIVE mg/dL
Hgb urine dipstick: NEGATIVE
Ketones, ur: NEGATIVE mg/dL
Nitrite: NEGATIVE
Protein, ur: 30 mg/dL — AB
Specific Gravity, Urine: 1.027 (ref 1.005–1.030)
Urobilinogen, UA: 0.2 mg/dL (ref 0.0–1.0)
pH: 8 (ref 5.0–8.0)

## 2010-10-06 LAB — BASIC METABOLIC PANEL
BUN: 11 mg/dL (ref 6–23)
CO2: 23 mEq/L (ref 19–32)
Calcium: 8.4 mg/dL (ref 8.4–10.5)
Chloride: 104 mEq/L (ref 96–112)
Creatinine, Ser: 0.92 mg/dL (ref 0.4–1.2)
GFR calc Af Amer: >60 mL/min (ref >60)
GFR calc non Af Amer: >60 mL/min (ref >60)
Glucose, Bld: 107 mg/dL — ABNORMAL HIGH (ref 70–99)
Potassium: 3.6 mEq/L (ref 3.5–5.1)
Sodium: 132 mEq/L — ABNORMAL LOW (ref 135–145)

## 2010-10-06 LAB — LIPID PANEL
Cholesterol: 307 mg/dL — ABNORMAL HIGH (ref 0–200)
HDL: 34 mg/dL — ABNORMAL LOW (ref >39)
LDL Cholesterol: 242 mg/dL — ABNORMAL HIGH (ref 0–99)
Total CHOL/HDL Ratio: 9 RATIO
Triglycerides: 157 mg/dL — ABNORMAL HIGH (ref <150)
VLDL: 31 mg/dL (ref 0–40)

## 2010-10-06 LAB — POCT I-STAT, CHEM 8
BUN: 11 mg/dL (ref 6–23)
Calcium, Ion: 1.14 mmol/L (ref 1.12–1.32)
Chloride: 103 mEq/L (ref 96–112)
Creatinine, Ser: 1 mg/dL (ref 0.4–1.2)
Glucose, Bld: 112 mg/dL — ABNORMAL HIGH (ref 70–99)
HCT: 35 % — ABNORMAL LOW (ref 36.0–46.0)
Hemoglobin: 11.9 g/dL — ABNORMAL LOW (ref 12.0–15.0)
Potassium: 3.9 mEq/L (ref 3.5–5.1)
Sodium: 134 mEq/L — ABNORMAL LOW (ref 135–145)
TCO2: 23 mmol/L (ref 0–100)

## 2010-10-06 LAB — HEMOGLOBIN A1C
Hgb A1c MFr Bld: 5 % (ref 4.6–6.1)
Mean Plasma Glucose: 97 mg/dL

## 2010-10-06 LAB — URINE MICROSCOPIC-ADD ON

## 2010-10-06 LAB — PROTIME-INR
INR: 1 (ref 0.00–1.49)
Prothrombin Time: 13.2 seconds (ref 11.6–15.2)

## 2010-10-06 LAB — HOMOCYSTEINE: Homocysteine: 8.7 umol/L (ref 4.0–15.4)

## 2010-10-06 LAB — VITAMIN B12: Vitamin B-12: 360 pg/mL (ref 211–911)

## 2010-10-06 LAB — TSH: TSH: 0.944 u[IU]/mL (ref 0.350–4.500)

## 2010-10-07 ENCOUNTER — Other Ambulatory Visit: Payer: Self-pay | Admitting: Internal Medicine

## 2010-10-07 DIAGNOSIS — K3189 Other diseases of stomach and duodenum: Secondary | ICD-10-CM

## 2010-10-07 NOTE — Progress Notes (Signed)
Quick Note:  Please arrange surgery appointment with Dr. Ezzard Standing unless she has a Careers adviser already. ______

## 2010-10-14 ENCOUNTER — Ambulatory Visit: Payer: Self-pay | Admitting: Internal Medicine

## 2010-11-08 NOTE — Discharge Summary (Signed)
Kaylee Hansen, Kaylee Hansen                ACCOUNT NO.:  [id number]   MEDICAL RECORD NO.:  [id number]          PATIENT TYPE:  INP   LOCATION:  1401                         FACILITY:  [facility]   PHYSICIAN:  Rosalyn Gess. Norins, MD  DATE OF BIRTH:  Mar 20, 1956   DATE OF ADMISSION:  09/16/2008  DATE OF DISCHARGE:  09/20/2008                               DISCHARGE SUMMARY   ADMITTING DIAGNOSES:  1. Aphasia, rule out stroke.  2. Hypertension.  3. Low back pain.  4. Lung cancer.  5. Seizure disorder.   DISCHARGE DIAGNOSES:  1. Aphasia, rule out stroke.  2. Hypertension.  3. Low back pain.  4. Lung cancer.  5. Seizure disorder.   CONSULTANTS:  Dr. Pearlean Brownie for neurology.   PROCEDURES:  1. CT of the brain without contrast performed March 24th, which showed      no acute abnormality with chronic mild atrophy.  2. MRI of the brain performed March 25th, which showed motion degraded      examination without definitive acute infarct as noted, atrophy with      mild small vessel disease is noted.  This was additionally reviewed      by Dr. Pearlean Brownie who did agree.  3. Carotid Doppler examination performed March 25th, which was read      out as showing no significant ICA stenosis.  4. EEG performed which showed diffuse slowing with no active seizure      spikes, read out by Dr. Pearlean Brownie in his follow-up note.   HISTORY OF PRESENT ILLNESS:  The patient is a 55 year old woman who had  been complaining of headaches for 3-4 days prior to admission.  The day  before admission she woke up from sleep and was not speaking and was  acting strange, not following commands or listening to her husband.  She  did not have any facial weakness, focal extremity weakness, gait or  balance problem.  She did have a past history of right brain stroke in  1997.  She also has had symptomatic epilepsy.  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  The patient  at the time of admission was on  Dilantin 200 mg daily, Dilantin level was subtherapeutic at 2.5.  She  was given an additional 300 mg of Dilantin and on the morning of  admission her level remained subtherapeutic at 4.2.  Of note, the  patient has not had any witnessed seizure activity by her husband.   Please see the history and physical BMR for past medical history, family  history, social history, admission and examination.   HOSPITAL COURSE:  Patient had a CT at admission which was noted above.  She was admitted to a regular floor for observation.  She did have  follow-up MRI brain and neuro consult is noted.  Over the course her  hospitalization the patient slowly recovered her ability to speak so  that on the day of discharge she spontaneously was able to say she  wanted to go home and that  she felt better.  The patient had no other  focal neurologic abnormalities.  Her diagnostic evaluation had been  unremarkable.  Primary problem was a subtherapeutic Dilantin level.  She  had this increased to 100 mg of Dilantin in the morning, 200 mg of  Dilantin nightly.   With the patient seeming to have recovered the ability to speak, with no  other focal neurologic abnormalities, with no other need for further  diagnostic evaluation, she is stable and ready for discharge home.   The patient's other medical problems remained stable during this  hospital stay.   DISCHARGE EXAMINATION:  Temperature was 98.3, blood pressure 110/54,  heart rate 111, respirations 14, O2 saturations 98% on room air.  The  patient is lying in bed, she is awake, she has no facial asymmetry.  She  is able to move spontaneously.  Speech is clear and intelligible with  only a slight cognitive delay.  Per her husband, she seems to be at her  baseline.  No further examination was conducted at discharge.   FINAL LABORATORIES:  The patient had a urine culture from the 24th with  10,000 colonies, multiple species not  indicative of infection.  Lipid  profile from March 26th with a cholesterol of 307, triglycerides 157,  HDL was 34, LDL 242.  Will defer management of this hyperlipidemia to  her outpatient physician.  Dilantin level from March 26th subtherapeutic  at 4.7, homocysteine March 25th normal range at 8.7, hemoglobin A1c 5%,  B12 was normal at 360, TSH was normal at 0.944.   DISPOSITION:  The patient is to be discharged home.   MEDICATIONS AT DISCHARGE:  1. Alprazolam 2 mg q.6 p.r.n.  2. Calcitriol 0.25 mg daily.  3. Paroxetine 40 mg every morning.  4. Dilantin 100 mg in the morning, 200 mg nightly.  5. Synthroid 150 mcg daily.  6. Trazodone 100 mg nightly.  7. Neurontin 100 mg 3 times daily.  8. Vistaril 500 mg p.r.n.  9. Lactulose 20-40 grams p.r.n.  10.Vicodin 10/325 one every 6 hours p.r.n.  11.Rocaltrol 0.25 mg daily.  12.Albuterol inhalers as needed.   DISPOSITION:  The patient is discharged to home.  She will have follow-  up laboratory in approximately 6 days.  She will need to call and  schedule an appointment with Dr. Posey Rea in 1-2 weeks.   The patient's condition at time of discharge dictation is improved and  stable.      Rosalyn Gess Norins, MD  Electronically Signed     MEN/MEDQ  D:  09/20/2008  T:  09/20/2008  Job:  [id number]   cc:   Pramod P. Pearlean Brownie, MD  Fax: [phone]   Georgina Quint. Plotnikov, MD  [address]  [place]  [place] [address]

## 2010-11-08 NOTE — Consult Note (Signed)
Kaylee Hansen, Kaylee Hansen                ACCOUNT NO.:  [id number]   MEDICAL RECORD NO.:  [id number]          PATIENT TYPE:  INP   LOCATION:  1401                         FACILITY:  [facility]   PHYSICIAN:  Pramod P. Pearlean Brownie, MD    DATE OF BIRTH:  [birth year]   DATE OF CONSULTATION:  09/17/2008  DATE OF DISCHARGE:                                 CONSULTATION   REFERRING PHYSICIAN:  Valerie A. Felicity Coyer, MD.   REASON FOR REFERRAL:  Has altered mental status, aphasia.   HISTORY OF PRESENT ILLNESS:  Kaylee Hansen is a 55 year old Caucasian lady  who apparently was complaining of some bad headaches for last 3-4 days  but yesterday morning she woke up from sleep and was not speaking and  acting strange, not following commands and listening to her husband.  She did not have any facial weakness, focal extremity weakness, gait or  balance problems.  Her past neurological history is significant for  remote right brain stroke in 1997 for which she had some left-sided  weakness.  Subsequently she also had symptomatic epilepsy.  She had  history of brain metastasis from small cell lung cancer for which she  had undergone radiation to the brain.  She was seen in our office in  2003 and 2004 for seizure disorder and was well controlled at that time  on Dilantin and Depakote. Currently she is on Dilantin 200 mg a day but  on admission, her level was found to be low at 2.5.  She has been given  300 mg of Dilantin and the level this morning was also suboptimal at  4.2.  The patient has not had any witnessed seizure activity as per the  husband.  She has remained quiet and aphasic during the present  admission.   PAST MEDICAL HISTORY:  Significant for:  1. Lung cancer with brain metastasis status post radiation to the      brain.  2. Vitamin D deficiency.  3. Mineralocorticoid deficiency colitis.  4. Low back pain.  5. COPD.  6. Osteoarthritis.  7. Hypothyroidism.  8. Hypertension.  9. Bronchitis.   10.Anxiety.  11.Depression.  12.Gait disturbance.  13.Remote stroke.  14.Insomnia.  15.Chronic headaches.   PATIENT'S HOME MEDICATIONS:  1. Synthroid.  2. Dilantin 100 twice a day.  3. Xanax.  4. Rocaltrol.  5. Trazodone.  6. Paxil.  7. Darvocet.  8. Albuterol.  9. Vitamin D3.  10.Enulose.  11.Hydroxyzine.  12.Neurontin 100 three times a day.   ALLERGIES TO MEDICATIONS:  PENICILLIN, OXYCONTIN, LIPITOR, MORPHINE,  OXYCODONE AND DURAGESIC PATCH.   SURGICAL HISTORY:  Left hip replacement.   FAMILY HISTORY:  The patient's family history is not known.   SOCIAL HISTORY:  The patient is married, lives with her husband.  She is  a smoker.  She is currently on disability.   REVIEW OF SYSTEMS:  Positive for chronic headaches, gait difficulties.  remote strokes.  No recent chest pain, fever, cough, diarrheal illness.   PHYSICAL EXAM:  Reveals a middle-aged Caucasian lady who is currently  not in distress.  Low grade fever of 99.4,  blood pressure 133/56, heart  rate 81 per minute, regular, respiratory rate 26 per minute.  Saturation  97% on 2 liters.  HEAD:  Nontraumatic.  NECK:  Supple.  There is no bruit.  ENT EXAM:  Unremarkable.  CARDIAC EXAM:  No murmur or gallop.  LUNGS:  Clear to auscultation.  ABDOMEN:  Soft, nontender.  NEUROLOGICAL EXAM:  The patient is awake and alert.  She has global  aphasia.  She is mute and speaks barely a few words.  She follows only a  few occasional simple midline commands or either in primary position.  She does not blink to threat on the right but does so on the left  consistently.  There is no facial asymmetry.  Tongue is midline.  Motor  system exam reveals no drift.  She has symmetric strength, tone,  reflexes, coordination, sensation.  Gait was not tested.   LABORATORY DATA:  Reviewed noncontrast CAT scan of the head done  yesterday.  Shows no acute abnormality.  Mild degree of changes of small  vessel disease and generalized atrophy  are noted.  Phenytoin level  yesterday was 2.5, today is 4.2.  Urine drug screen is positive for  opiates and benzos.   IMPRESSION:  55-year-old lady with sudden onset of aphasia  likely due to left middle cerebral artery branch infarct, etiology to be  determined.  Nonconvulsive seizure status is less likely given the fact  that her previous seizures were in the right brain.   PLAN:  1. I agree with giving extra doses of Dilantin to keep optimal levels.  2. Check MRI scan of the brain as well as MRA of the brain, EEG, two-      dimensional echo, carotid Dopplers, fasting lipid profile,      hemoglobin A1c, homocystine.  3. Continue aspirin for now.  4. Speech therapy for language and swallowing.  5. I had a long discussion with the patient and her husband regarding      her symptoms, plan for evaluation and treatment, and answered      questions.  6. Kindly call for questions.  We will be happy to follow the patient      in consults.           ______________________________  Sunny Schlein. Pearlean Brownie, MD     PPS/MEDQ  D:  09/17/2008  T:  09/17/2008  Job:  [id number]

## 2010-11-08 NOTE — Procedures (Signed)
CLINICAL HISTORY:  A 55 year old female was admitted for acute mental  status change; also complains of headache; confusion; aphasic; not  following commands; history of stroke with left-sided weakness; lung  cancer; metastatic lesion to the brain, status post radiation therapy.   CURRENT MEDICATIONS:  Lovenox, Dilantin, Phenergan, Ativan, and morphine  as well.   TECHNICAL COMMENT:  An 18-channel EEG was performed based on standard  international 10-20 system.  Total recording time 23.5 minutes.  A 17-  channel channel dedicated to EKG, which has demonstrated normal sinus  rhythm of 66 beats per minute.   Upon awakening, the posterior background activity was obscured by  frequent eye blinking and motion artifact.  It was diffusely low  amplitude, in alpha range, I could not accurately access whether it is  reactive to eye blinking or not because of low amplitude, there was  reported patient's left hand movement, eye blinking, sneezing, head  movement, but none of the movement associated with any epileptiform  discharge.   Photic stimulation and hyperventilation were not performed, the patient  was described as confused, no sleep was achieved.   In conclusion, this is an abnormal yet technically difficult study, the  low amplitude background activity indicate cerebral dysfunction, common  etiology including metabolic toxic reasons, or medicine side effect, but  there was no evidence of epileptiform discharge.      Levert Feinstein, MD  Electronically Signed     UJ:[id number]  D:  09/18/2008 22:47:13  T:  09/19/2008 04:56:38  Job #:  [id number]

## 2010-11-11 NOTE — Op Note (Signed)
NAMESAYDI, KOBEL                ACCOUNT NO.:  [id number]   MEDICAL RECORD NO.:  [id number]          PATIENT TYPE:  AMB   LOCATION:  DSC                          FACILITY:  [facility]   PHYSICIAN:  Anselm Pancoast. Weatherly, M.D.DATE OF BIRTH:  [birth year]   DATE OF PROCEDURE:  03/31/2005  DATE OF DISCHARGE:                                 OPERATIVE REPORT   PREOPERATIVE DIAGNOSES:  1.  Non-used Port-A-Cath.  2.  History of lung cancer with brain metastasis.   OPERATION:  Removal of Port-A-Cath.   ANESTHESIA:  Local.   LOCATION:  Minor surgery room.   SURGEON:  Anselm Pancoast. Zachery Dakins, M.D.   HISTORY:  Kaylee Hansen is a 55 year old female who in '99 was diagnosed with  breast cancer, then actually had a brain metastasis and was treated with  chemotherapy and radiation therapy, and has done splendid.  She has had no  treatments for any further problems over a period of 5 years.  The Port-A-  Cath has been flushed and she is being presently followed by Dr. Arlan Organ.  He has recommended that she go ahead and have her Port-A-Cath  removed and she is here for that planned procedure.  She had been on  Coumadin, but that was discontinued approximately a week ago.   DESCRIPTION OF PROCEDURE:  The Port-A-Cath was located above the left breast  and through a transverse incision, I anesthetize the area after a Betadine  prep and then made the incision to approximately half of the previous  insertion incision.  The Port-A-Cath was visualized.  The little sutures  tacking it down were clipped and then I could pull the Port-A-Cath up, freed  up the Silastic connector, it was a detachable one, placed a 3-0 chromic  around it and removed Port-A-Cath -- it was removed entirely -- and then  tied the little pursestring around the little sheath.  The wound was closed  with 3-0 chromic, Benzoin and Steri-Strips on the skin.  The patient  tolerated the procedure nicely and was released after a short-stay  period.  She already has pain medication which she can take and we will seen in the  office in about 2 weeks for followup, unless there are some wound problems.           ______________________________  Anselm Pancoast. Zachery Dakins, M.D.     WJW/MEDQ  D:  03/31/2005  T:  03/31/2005  Job:  [id number]

## 2010-11-11 NOTE — Discharge Summary (Signed)
NAMEMAYRE, BURY NO.:  [id number]   MEDICAL RECORD NO.:  [id number]          PATIENT TYPE:  INP   LOCATION:  1516                         FACILITY:  [facility]   PHYSICIAN:  Madlyn Frankel. Charlann Boxer, M.D.  DATE OF BIRTH:  09/15/1955   DATE OF ADMISSION:  02/22/2006  DATE OF DISCHARGE:  03/07/2006                                 DISCHARGE SUMMARY   ADMISSION DIAGNOSES:  1. Left hip avascular necrosis.  2. 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  3. History of brain metastasis and external beam radiation therapy in      2000.  4. 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  5. Collagenous colitis.  6. Hypothyroidism.  7. Explantation of Port-A-Cath in 2006.  8. Exploratory laparotomy for abdominal pain and colitis.  9. Urinary tract infection being treated.  10.Right hip early avascular necrosis, asymptomatic.   DISCHARGE DIAGNOSES:  1. Left hip avascular necrosis.  2. 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  3. History of brain metastasis and external beam radiation therapy in      2000.  4. 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  5. Collagenous colitis.  6. Hypothyroidism.  7. Explantation of Port-A-Cath in 2006.  8. Exploratory laparotomy for abdominal pain and colitis.  9. Urinary tract infection being treated.  10.Right hip early avascular necrosis, asymptomatic.  11.Postoperative anemia.  12.Postoperative hyponatremia treated with diet.   PROCEDURE:  On March 02, 2006, the patient underwent left total hip  replacement arthroplasty utilizing Dupuy hip system with Jamelle Rushing,  P.A. assisting.   BRIEF HISTORY:  This 55 year old lady with left hip avascular necrosis was  found on the kitchen floor by her son.  It was unknown the reason for her  falling.  She had a complex past history, but recently had illness with  general weakness for several weeks.  She  was seen by Corwin Levins, M.D.  about a week prior to admission and by Georgina Quint. Plotnikov, M.D. on August  29.  She had been treated for UTI and was on day #7 for ciprofloxacin on  admission.  After her fall she had significant pain in her left hip, was  brought to the emergency room.  She was seen and admitted by Casimiro Needle E.  Norins, M.D.  X-rays and MRI showed ABN of the left femoral head with  collapse.  A consult was asked for and received by Dr. Debby Bud from Erasmo Leventhal, M.D.  He recommended that touchdown weightbearing to the  painful side and hip injection to hopefully control her discomfort.  Unfortunately the injection did not want and we decided eventually a total  hip would be indicated.  Dr. Jonny Ruiz and Raenette Rover. Felicity Coyer, M.D. continued to  follow the patient adjusting her medications according to her needs.  Palliative care was also asked to see the patient in consult.  Rosanne Sack, M.D. officially consulted.  Palliative care continued to follow  her throughout her hospitalization.  On September 7, she underwent the above  surgery  and did very well postoperatively.  She needed a marked amount of  encouragement in physical therapy.  It was hoped that the patient might  benefit with an inpatient program of rehabilitation at [facility],  however, due to the fact of poor safety judgment and lagging cognitive  skills, she was not a candidate.  The discharge planning staff as well as  our attending discussed with the patient details that it would be best for  her to go to skilled nursing for several weeks so she could gain her  strength and confidence in activities and eventually return hom.  All of  this was discussed with her husband today.  It was felt that since she would  not necessarily have 24-hour care at home and the husband worked and the son  really could not tend to her personal hygiene, it would not be appropriate  for her to go home at this  time.  Hopefully she will be able to go to a  skilled nursing for 2-3 weeks so she can regain her strength and activity.   Wound was clean and dry at the time of discharge.  Neurovascular was grossly  intact in the operative extremities.  She was ambulating with a walker in  her room and hallway, but only about 20 feet and was very shaky on her  feet.  We decided that we could have her go to skilled nursing if she  accepts such a plan.  We, of course, would have Dr. Jonny Ruiz and the other  [facility] continue to follow with her and the palliative care group  only under Dr. Raphael Gibney recommendation.   LABORATORY DATA:  Hematologically showed a preoperative CBC with a very mild  anemia with RBC at 3.86.  The hemoglobin on March 05, 2006, was 8.5 with  hematocrit of 24.8.  We have a hematocrit and hemoglobin pending.  Blood  chemistries were essentially normal throughout her hospitalization.  She had  a drop in sodium down to 131.  This was treated with diet bringing her  sodium up to 142.  The phenytoin level was 6.0.   Chest x-ray showed chronic interstitial changes, mild cardiomegaly, but no  failure.  Electrocardiogram showed normal sinus rhythm, possible inferior  infarct, age undetermined.   CONDITION ON DISCHARGE:  Improved, stable.   DISCHARGE MEDICATIONS:  Ordered primarily by Dr. Jonny Ruiz.  1. Paxil 20 mg tablet daily.  2. Dilantin 100 mg capsule daily.  3. Levothyroxine 150 mcg daily.  4. Xanax 1 mg tablet daily.  5. Zocor 20 mg tablet at 1800 hours.  6. Xanax 1 mg tablet q.h.s.  7. Ambien 10 mg tablet q.h.s.  8. Dilantin 100 mg capsule q.h.s.  9. Desyrel 100 mg tablet q.h.s.  10.Colace 100 mg one b.i.d.  11.Ferrous sulfate 325 mg t.i.d.  12.Dulcolax p.r.n. constipation.  13.Vicodin for pain.  14.Robaxin 500 mg for muscle spasms.   WOUND CARE:  She can have dry dressing to the operative site as needed.  She is to be weightbearing as tolerated on the operative extremity  on the left.   Any medical questions should be directed toward Dr. Jonny Ruiz of the [facility] and any orthopedic questions can be directed toward Dr. Nilsa Nutting office  at [phone].  Dr. Charlann Boxer will need to see the patient back about 2-3 weeks  after date of surgery.      Dooley L. Cherlynn June.      Madlyn Frankel Charlann Boxer, M.D.  Electronically Signed  DLU/MEDQ  D:  03/07/2006  T:  03/07/2006  Job:  [id number]   cc:   Corwin Levins, MD  [address]  [place]  [place] [address]

## 2010-11-11 NOTE — Discharge Summary (Signed)
[place]  Patient:    Kaylee Hansen, Kaylee Hansen                    MRN: [id number] Adm. Date:  [id number] Disc. Date: [id number] Attending:  Janan Halter CC:         Miquel Dunn. Catha Gosselin, M.D.             Jackelyn Knife, M.D.             Petra Kuba, M.D.             Arvella Merles, M.D.                           Discharge Summary  DISCHARGE DIAGNOSES: 1. 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 2. Failure to thrive of uncertain etiology. 3. 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 4. Hypothyroidism, not actively compensated with medications. 5. History of irritable bowel. 6. History of anxiety and depression.  HISTORY OF PRESENT ILLNESS:  The patient is a 55 year old patient that I admitted for workup of dyspnea and pain control.  She has had a history of small cell lung carcinoma diagnosed in January 1999.  It was a limited stage disease initially.  She was treated with two cycles of carboplatin and Taxol last February 1999, which were tolerated poorly and then discontinued.  She then had chest radiotherapy by Dr. Jackelyn Knife with 4680 rads which was thought due to early toxicity.  She, however, did well until May 2000, when she developed a new right cerebellar metastases.  She received palliative brain radiation therapy by Dr. Dorna Bloom and restaging in 10/00, showed the scans to be negative.  She was readmitted in January with failure to thrive.  Upper and lower endoscopies in September had been negative and repeat upper endoscopy in January was negative.  Repeat CT scan of the head and neck, chest, abdomen, pelvis and bone scan were all negative for recurrent disease.  Barium swallow did show a little delay initially in swallowing.  She had been able to stay out of the hospital but her status again started declining in terms of pain control, respiratory control and p.o. intake.  She was  subsequently readmitted.  PHYSICAL EXAMINATION:  VITAL SIGNS:  Stable.  LUNGS:  Decreased breath sounds bilaterally with some mild expiratory wheezes.  ABDOMEN:  Liver edge felt like it might be down a little bit below the costal margin, which would have been new.  LABORATORY DATA and X-RAY FINDINGS:  Sodium 137, potassium 3.2, BUN 9, creatinine 1.1.  Alk phos 57.  GOT 14, GPT 10.  Albumin 3.5.  Calcium 8.6. LDH 99.  Room air ABG showed a pH of 7.46, pCO2 36, pO2 82, bicarb 25, saturations 97%.  HOSPITAL COURSE:  The patient remained a no code blue as before.  We subsequently decided to restage her because the question is always whether or not her small cell lung cancer, which has a high propensity to relapse, has relapsed.  However, CT scan of the head, neck, chest, abdomen and pelvis were completely normal.  Total body bone scan was normal.  As such, I told her it looked as though her cancer was remaining in remission. I did go ahead and treat her for a COPD exacerbation due to her prior smoking. She did seem to get better on it and we had increased  her Synthroid dose because her TSH had been 214 and T4 2.4.  She had been on 0.25 mg and we put her on 0.3 mg.  Her breathing status improved but she became quite confused on May 8, on steroid therapy.  We stopped the steroids and she slowly got better.  She had had a little bit of this before but certainly not to this extent.  She was quite psychotic and not able to sleep at one point and time while on the steroids.  We subsequently stopped them and again, the effect of the steroids by May 10, had seemed to resolve.  By then, and with her cancer situation stable, we felt she was ready for discharge home.  I did sit down and talk with she and her husband about the fact that she had no evident disease right now.  I did note to her that her thyroid pill was different than the one she had taken to admission.  DISCHARGE  MEDICATIONS: 1. Synthroid 0.3 mg p.o. q.d. 2. Paxil 20 mg p.o. q.d. 3. Premarin 0.125 mg p.o. q.d. 4. Coumadin 1 mg p.o. q.d. 5. Remeron 30 mg p.o. q.h.s. 6. Xanax 0.5 mg tablets one p.o. q.8h. p.r.n. anxiety. 7. Darvocet one to two tablets p.o. q.6h. p.r.n. pain.  ACTIVITY:  Unrestricted.  DIET:  Unrestricted.  SPECIAL INSTRUCTIONS:  She will call [phone] for any problems or questions.  FOLLOWUP:  Return to see Dr. Catha Gosselin on May 31 and to see Dr. Tiburcio Pea on December 05, 1999, at 9 a.m. on a Monday.  CONDITION ON DISCHARGE:  At the time of discharge, her bowel status was improved.  Prognosis remains guarded. DD:  11/13/99 TD:  11/16/99 Job: [id number]

## 2010-11-11 NOTE — H&P (Signed)
Kaylee Hansen, Kaylee Hansen                ACCOUNT NO.:  [id number]   MEDICAL RECORD NO.:  [id number]          PATIENT TYPE:  INP   LOCATION:  0104                         FACILITY:  [facility]   PHYSICIAN:  Rosalyn Gess. Norins, MD  DATE OF BIRTH:  [birth year]   DATE OF ADMISSION:  02/22/2006  DATE OF DISCHARGE:                                HISTORY & PHYSICAL   2030 hours.   CHIEF COMPLAINT:  Left hip pain and can not bear weight or walk.   HISTORY OF PRESENT ILLNESS:  Kaylee Hansen is a 55 year old married white  female with a complex past medical history who reports that she has had a  illness with general weakness for several weeks.  She has been seen by Dr.  Efrain Sella approximately one week ago, Dr. Sula Soda on August 29 with no  definitive diagnosis.  She has been treated for a UTI and is on day 7 or 8  of Ciprofloxacin.  The patient reports that earlier today she felt weaker  than usual and felt like she was going to pass out and then did lose  consciousness sustaining a fall.  Upon awaking, she had significant pain in  her left hip and was unable to stand or bear weight.  For this reason, she  was brought to the emergency department.  Initial evaluation including MRI  of the hips revealed avascular necrosis bilaterally, left greater than  right, trochanteric bursitis and synovitis thought to be mild, but no  fracture was noted.  Patient is now admitted for pain control, orthopedic  consult, PT and OT evaluation and planning for disposition.   PAST MEDICAL HISTORY:  1. Small cell CA status post chemotherapy and radiation.  2. History of brain metastasis with external beam radiation therapy in      2000.  3. Seizure disorder following a 6-day coma secondary to Todd's paralysis.  4. Collagenous colitis.  5. Hypothyroidism.  6. Explantation of a Port-A-Cath in 2006.  7. Exploratory laparotomy for her abdominal  pain and colitis.   GYN HISTORY:  Patient is a gravida 2, para 2.   Other  surgery includes left knee arthroscopy with injection and a tendon  repair of her left knee in 2006.   CURRENT MEDICATIONS:  1. Hydrocodone/APAP 7.5/750 q.i.d.  2. Paroxetine 40 mg daily.  3. Phenytoin 100 mg q.a.m., 200 mg q.p.m.  4. Trazodone 100 mg q.h.s.  5. Synthroid 150 mcg q. day.  6. Ambien 10 mg p.o. q.h.s.  7. Alprazolam 1 mg q.a.m., 2 mg q.p.m.  8. Simvastatin 20 mg q.p.m.  9. Cipro 250 b.i.d., started on August 27.   ALLERGIES:  PENICILLIN, CLEAR TAPE, QUESTION OF MYALGIAS WITH OTHER STATIN  DRUGS.   FAMILY HISTORY:  Noncontributory.   SOCIAL HISTORY:  Patient is married.  She has two children.  She has  altogether 12 grandchildren, several through marriage, 5 of her own.   REVIEW OF SYSTEMS:  Unremarkable, except for the HPI.   PHYSICAL EXAMINATION:  VITAL SIGNS:  At admission, temperature 97.8, blood  pressure 138/57 heart rate 75, respirations 20.  O2 sat is 97%.  GENERAL:  This is an overweight Caucasian female who appears weak and has a  flat affect.  HEENT:  EACs and TMs were normal.  Patient is edentulous with full dentures.  Conjunctivae and sclerae is clear.  Pupils equal, round, reactive to light  and accommodation.  NECK:  Supple without thyromegaly.  No lymphadenopathy was noted in the  cervical supraclavicular regions.  CHEST:  No CVA tenderness.  LUNGS:  Clear to auscultation and percussion.  BREAST:  Deferred.  CARDIOVASCULAR:  2+ radial pulses.  She had a quiet precordium with a  regular rate and rhythm without murmurs, rubs or gallops.  NECK:  No JVD, no carotid bruits.  ABDOMEN:  Base has a well-healed ventral surgical scar from the Xyphoid  process to the suprapubic level.  Patient had positive bowel sounds.  She  had guarding to palpation, but no gross organo-splenomegaly was noted.  PELVIC:  Deferred.  RECTAL:  Deferred.  EXTREMITIES:  Upper extremities are normal.  Right lower extremity with  normal flexion at the knee, normal external  rotation of the hip.  Left lower  extremity:  I could not flex the knee secondary to pain and stiffness.  Trying to rock or move the hip caused the patient significant pain and  discomfort.  NEUROLOGIC:  Nonfocal.  SKIN:  Clear.   LABORATORY:  Hemoglobin 13.3 g, white count 7,800 with a normal  differential.  Chemistries were normal with a glucose of 88, creatinine 1.0,  potassium of 4.  LFTs were normal, Dilantin level 6.0.  MRI of the hips is  noted with bilateral AVN, left greater than right; bilateral trochanteric  bursitis and synovitis.  Her last MRI of the brain performed December 31, 2004  showed old lacunar stroke at the right side of the pons, small vessel  changes in the deep white matter.  Pituitary gland is normal.  Sinuses were  normal.  No evidence of metastatic disease.   ASSESSMENT/PLAN:  Orthopedics.  Patient is status post fall, now with  significant pain and inability to bear weight but no fractures noted.  She  does have significant bursitis.  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   PLAN:  1. Toradol 30 mg IV q.8.  Can view the patient's other pain medications.      Orthopedic consult for questionable possible steroid injections.  PT/OT      evaluation to establish a program for rehab and home care.  2. Seizure disorder, stable.  3. I.D.  Patient being treated for a UTI.  Current labs are normal.  No      fever is noted.  Plan:  Will continue Cipro at this time.  Will get a      followup UA.  4. Weakness and failure to thrive.  Etiology at this time is unclear.      Patient has had multiple office visits.  Her laboratory as noted is      unremarkable.  This is not in the key problem and will reserve to the      outpatient evaluation.           ______________________________  Rosalyn Gess Norins, MD     MEN/MEDQ  D:  02/22/2006  T:  02/22/2006  Job:  [id number]   cc:   Georgina Quint. Plotnikov, MD  [address]  [place]  [place] [address]

## 2010-11-11 NOTE — Discharge Summary (Signed)
NAMEZYKIRIA, BRUENING NO.:  [id number]   MEDICAL RECORD NO.:  [id number]          PATIENT TYPE:  INP   LOCATION:  1516                         FACILITY:  [facility]   PHYSICIAN:  Madlyn Frankel. Charlann Boxer, M.D.  DATE OF BIRTH:  1956-02-09   DATE OF ADMISSION:  02/22/2006  DATE OF DISCHARGE:  03/07/2006                                 DISCHARGE SUMMARY   ADDENDUM:  After discussing the benefits of skilled nursing and continued  rehabilitation it was thought perhaps that might appeal to the family.  However the patient continued to be quite adamant and did not want to go to  a nursing home.  I questioned both the husband and the patient at length  why, apparently her mother was in a nursing home facility as well as his  mother and according to them it was not satisfactory and again there were  very adamant about not going there.  I therefore contacted [place] and spoke  with Debbie. They have worked with this lady before and they are aware of  her limitations.  They will be working with them.  I discussed at length the  concern that I have about the safety of her at home. They again said that  they could get along okay.  I will leave all other discharge medications per  our instructions:   MEDICATION LIST:  Vicodin for pain, Robaxin as a muscle relaxant, continue  with Lovenox for 6 days, 30 mg b.i.d. then stop.   FOLLOW UP:  To see Dr. Charlann Boxer in about 2 to 2-1/2 weeks after surgery.   All other medications and treatment are to be under the direction of Dr.  Melvyn Novas along with the palliative care physicians.  I have indicated at  discharge in the orders that any other medications other than those listed  above are to come from Dr. Felicity Coyer, Dr. Melvyn Novas or the palliative care  physicians.  I also recommended they set out the medication for reconciliation form as  those medications should be under their direction.      Dooley L. Cherlynn June.      Madlyn Frankel Charlann Boxer, M.D.  Electronically Signed    DLU/MEDQ  D:  03/07/2006  T:  03/07/2006  Job:  [id number]   cc:   Corwin Levins, MD  [address]  [place]  [place] [address]

## 2010-11-11 NOTE — H&P (Signed)
[place]. [facility]  Patient:    Kaylee Hansen, Kaylee Hansen                    MRN: [id number] Adm. Date:  [id number] Attending:  Janan Halter                         History and Physical  SUMMARY:  Patient is a 55 year old female patient that I have followed for small cell lung carcinoma, admitted for dyspnea and pain control.  Past medical history is positive in January 1999 for massive right paratracheal  adenopathy.  Abdomen, pelvis, brain and bones were all negative.  Her limited-stage disease was treated with two cycles of carboplatin and Taxol, last February 1999, tolerated poorly and discontinued then.  She then had chest radiation therapy by Dr. Jackelyn Knife, 4680 rads, which was likewise stopped early due to toxicity.  She did well until May 2000, when MRI showed a new right cerebellar metastasis.  She received palliative brain radiation therapy by Dr. Dorna Bloom.  Restaging in October showed scans essentially negative.  Despite this, she was readmitted in January of this year with failure to thrive.  Upper and lower endoscopies in September had  been negative and repeat upper endoscopy in January was negative.  Repeat CT scan of the head and neck, chest, abdomen and pelvic and bone scan were all negative. Barium swallow showed a little delay in initiating swallowing.  Since then, we ad remained out of the hospital but status is slowly declining in terms of respiratory status and pain control.  She is readmitted at this time for workup.  PAST MEDICAL HISTORY:  Positive for collagenous colitis, followed by Dr. Petra Kuba.  Positive for gastroesophageal reflux disease.  Positive for  irritable bowel.  Positive for anxiety and depression and hypothyroidism.  ALLERGIES:  PENICILLIN.  FAMILY HISTORY:  Negative.  SOCIAL HISTORY:  Married, two children and lives in [place].  She is a former  smoker.  REVIEW OF SYSTEMS:  No real fever or chills.   Occasional headache.  Some change in her vision.  She was still followed up with ophthalmology but has not to date.  Bowels seem to be doing okay otherwise.  PHYSICAL EXAMINATION:  VITAL SIGNS:  Temperature 99.5 degrees, pulse 80, respirations 20, blood pressure 90/59.  HEENT:  Oral mucosa is dry.  NECK:  No cervical, supraclavicular or axillary adenopathy.  HEART:  S1 greater than S2, without rubs or gallops.  LUNGS:  Decreased breath sounds bilaterally, possibly some very mild expiratory  wheezing and mild rhonchi bilaterally.  ABDOMEN:  It feels as though her liver edge may be down about 6 cm, which would be new.  Spleen edge is not felt.  Bowel sounds are otherwise active.  Lower abdomen soft, nontender.  EXTREMITIES:  No ankle edema.  BREASTS:  Deferred.  LABORATORY DATA:  Sodium 137, potassium 3.2, CO2 of 24, BUN of 9, creatinine 1.1, alkaline phosphatase 57, GOT of 14, GPT of 10, albumin of 3.5, calcium 8.6, LDH 99. Room air ABG shows a pH of 7.46, PCO2 of 36, PO2 of 82, bicarb of 25 and saturation 97%.  ASSESSMENT:  Extensive stage small cell carcinoma of the lung to brain, status ost treatment, and she has been in radiologic complete response, but now with increasing diffuse pain and increasing dyspnea.  PLAN:  Will do CT scanning and a bone scan to see where we stand  with her disease. Bronchitis treatment will be started with nebulizer treatments, steroids and IV  antibiosis.  She is a no code blue in our discussion tonight and we will help her set up a living will. DD:  10/28/99 TD:  10/29/99 Job: [id number]

## 2010-11-11 NOTE — Discharge Summary (Signed)
NAMESHARRIE, SELF NO.:  [id number]   MEDICAL RECORD NO.:  [id number]          PATIENT TYPE:  INP   LOCATION:  0382                         FACILITY:  [facility]   PHYSICIAN:  Rene Paci, M.D. LHCDATE OF BIRTH:  Jun 22, 1956   DATE OF ADMISSION:  06/10/2004  DATE OF DISCHARGE:  06/14/2004                                 DISCHARGE SUMMARY   DISCHARGE DIAGNOSES:  1.  Diffuse pain with migraine, chest pain, abdominal pain.  Negative      medical workup.  Please see details below.  2.  History of small cell lung cancer.  No evidence of new or old brain      metastasis.  3.  History of migraines.  Continue Imitrex as tolerated.  4.  History of collagenous colitis.  Negative CT of the abdomen and pelvis.      Outpatient follow up with gastroenterology.  5.  History of pancreatitis.  No evidence of active disease.  6.  History of hypertension.  7.  History of hypothyroidism.  8.  History of irritable bowel syndrome.  9.  History of seizure disorder.  10. History of depression with anxiety.   DISPOSITION:  Patient is discharged home, tolerating a regular diet.  Still  complaining of fatigue and pain diffusely in her abdomen as well as the  lower part of her head and neck but ambulatory, tolerating pills, and  understands no further workup to be done in the hospital.   HOSPITAL FOLLOW UP:  With Dr. Neale Burly at the [facility] for  January 3 at 1:50 p.m.  Patient is given instructions on this regard.  Also,  with her primary care physician, Dr. Posey Rea, to be arranged in early  January as well.   DISCHARGE MEDICATIONS:  1.  Phenergan 25 mg p.o. q.4h. p.r.n. nausea.  2.  Imitrex 50 mg p.o. q.6h. p.r.n. migraine, not to exceed 4 tablets in a      24-hour period.  3.  Other medications are as prior to admission, and these include Dilantin      150 mg p.o. q.a.m. plus 200 mg q.p.m., Xanax 0.5 mg t.i.d., Coumadin 1      mg p.o. q.d., Paxil 40 mg p.o.  q.d., Desyrel 50 mg p.o. q.h.s., Ambien      10 mg p.o. q.h.s., Lactulose 15 ml p.o. q.o.d., Vytorin 10/20 1 p.o.      q.d., Mobic 50 mg p.o. q.d., baby aspirin 81 mg p.o. q.d., Synthroid 150      mcg p.o. q.d., Vicodin 7.5 mg 1 q.i.d.   HOSPITAL COURSE:  Problem #1:  Chest pain:  The patient is a 55 year old  woman with chronic pain syndrome, depression, anxiety, who has a history of  small cell lung cancer with brain metastasis and history of radiation  treatment, who presented to the primary care physician's office and was seen  by partner, Dr. Debby Bud, on the day of admission with complaints of chest  pain, chest tightness, headache, and abdominal pain.  She says she was  intolerant to her medications because of nausea.  Because  of her past  complex medical history as well as intolerance to medications, she was  admitted to observe her for IV fluids and further workup.  Cardiac enzymes  were negative.  Telemetry was negative for any cardiac source.  MRI of the  brain failed to reveal any acute abnormalities or any old evidence of mets,  certainly no new evidence of metastatic disease.  Regarding her abdominal  pain, a CT of the abdomen and pelvis was performed, which also showed no  acute disease.  Laboratory data was negative for any evidence of hepatitis,  pancreatitis, renal failure, or infection.  Patient was not anemic nor did  she have an elevated white count.  Despite this negative workup, the patient  remained nauseated with complaints of headache.  Because of the negative  cardiac workup, she was resumed on her Imitrex, which has given her relief  in the past.  Patient says this does help but only lasts for four hours.  She has previously been seen by the [facility], and followup  will be arranged with them, as described above.  Problem #2:  Her nausea was improved with Phenergan, so this medication has  been added to her regimen, and a prescription has been  provided; however, at  this time, having no other positive findings, we have nothing further to  offer the patient at this time, and she is being discharged home.  She is  tolerating a regular diet without vomiting.  No diarrhea.  Continuing  Vicodin at dose and frequency as prior to admission.  Patient was offered a  prescription for prescription but believes that oxycodone previously is what  triggered her seizure disorder, and she has had none since taking Percocet.  Problem #3:  Patient's other medical issues are as listed above.  No other  changes were made in her regimen except as listed.     Vale   VL/MEDQ  D:  06/14/2004  T:  06/14/2004  Job:  [id number]

## 2010-11-11 NOTE — Consult Note (Signed)
[facility]  Patient:    Kaylee Hansen, Kaylee Hansen                    MRN: [id number] Proc. Date: 12/05/99 Adm. Date:  [id number] Attending:  Janan Halter CC:         Miquel Dunn. Catha Gosselin, M.D.             Petra Kuba, M.D.                          Consultation Report  HISTORY OF PRESENT ILLNESS:  [name] is a 55 year old female who I asked to see for recurrent and persistent diarrhea along with reflux, nausea and vomiting. She has a complicated medical history in that she is status post chemotherapy for small cell lung cancer. She also received radiation to her chest and subsequently radiation to her brain for metastatic disease of the cerebellum. She has a long history of gastrointestinal symptoms preceding her lung cancer. She has been diagnosed in the past with collagenous colitis. She reports this diagnosis was made at [facility] and she also tells me she had a laparotomy as part of her evaluation at that time. She has also been told that she has irritable bowel syndrome and she is status post cholecystectomy which may have aggravated her loose stools. She has been treated in the past with steroids and with Azulfidine apparently without great benefit and she had psychiatric disturbances from the steroids. She has been extensively evaluated endoscopically and radiographically in the past. Most recently in September of 2000, she had a small bowel series that was probably normal but suggested mild delayed transit. She had an upper endoscopy in January of this year which was negative. Reportedly she is in radiologic remission from her small cell carcinoma. She is admitted with a 12 pound weight loss, dehydration, hypokalemia, nausea and vomiting and perfuse diarrhea. She says she is having over 10 bowel movements per day prior to the Lomotil and still having 8-10 per day with Lomotil. They are watery and nonbloody. She says she had difficulty swallowing her food. It  feels like it does not want go down high in the cervical area and then may hang up low in the retrosternal area and she says if she does successfully swallow it, it may sit in her stomach for a while and then decide to return.  PAST MEDICAL HISTORY:   As noted above pertinent for GERD, IBS, anxiety, depression, collagenous colitis and small cell lung cancer metastatic.  CURRENT IN HOSPITAL MEDICATIONS:  Paxil 20 mg per day, Benadryl 50 mg q. 6h p.r.n. nausea, remeron 15 mg q.h.s., Premarin 1.25 mg per day, Coumadin 1 mg per day, Levsin 0.125 mg per day, Marinol 5 mg b.i.d., Synthroid 250 mcg per day, Zofran 8 mg q. 6h, Lomotil q. 6h p.r.n., Phenergan 25 mg q. 4h p.r.n., Xanax 0.5 mg q. 6h p.r.n., Darvocet-N 100.  ALLERGIES:  PENICILLIN and psychiatric side effects to STEROIDS.  FAMILY HISTORY:  Noncontributory.  SOCIAL HISTORY:  She is a married former cigarette smoker.  REVIEW OF SYSTEMS:  As noted above.  PHYSICAL EXAMINATION:  GENERAL:  She is a well-developed, obese adult female in no acute distress.  VITAL SIGNS:  Afebrile with a blood pressure of 80/55, pulse 76 and regular.  SKIN:  Normal.  HEENT:  Eyes are anicteric. Oropharynx reveals moist mucous membranes. I do not appreciate any cervical adenopathy.  CHEST:  Sounds reveal scattered rhonchi.  HEART: Distant regular rate and rhythm.  ABDOMEN:  Obese and soft with normal bowel sounds. There is mild left lower quadrant tenderness to deep palpation. There is no rebound.  RECTAL:  Not performed.  EXTREMITIES:  Without edema.  X-RAYS:  Chest x-ray revealed mild interstitial changes. Abdominal films revealed normal bowel gas pattern. Electrolytes today are normal. Amylase and lipase are normal.  TSH is mildly low at 0.194 and T4 is mildly high at 2.27.  IMPRESSION:  A 55 year old female with multiple medical problems. Her reflux, nausea, vomiting and diarrhea will undoubtedly be somewhat difficult to  manage because of the long duration of her symptoms. In addition, her upper GI symptoms are ideally managed by promotility agents to hasten gastric emptying and movement through the small bowel but her diarrhea is best managed by antimotility interventions and it may be difficult to play one against the other. Since she is having reflux, I would recommend beginning her on Protonix 40 mg per day and I would place her on a Reglan 10 mg before meals and at bedtime. This may aid her nausea, questionably improve the reported delay in small bowel transit, and decrease her reflux symptoms. The dose could be accelerated to 20 mg at a time if needed. Perhaps her other antinausea medications could be tapered. Her diarrhea is likely multifactorial secondary to irritable bowel syndrome in association with reported collagenous colitis post cholecystectomy diarrhea, possible effects of prior chemotherapy, possible lactose intolerance and the unlikely hormonally mediated secretory diarrhea related to small cell cancer. I would recommend that she be begun on amitriptyline 50 mg q.h.s. This is useful because it suppresses cyclic vomiting and may effect her upper tract symptoms as well. It also decreases bowel motility and secretion and sensitivity. I would place her on Imodium 4 mg every 6 hours and treat her presumptively for collagenous colitis with the new agent balsalazide and follow her symptoms. I will discuss with Dr. Ewing Schlein whether he feels any further endoscopic evaluation is in order. Since her thyroid status seems to be mildly hyperthyroid at present, perhaps her Synthroid dose should again be lowered.  Further recommendations will follow her response to therapy and my discussion with Dr. Ewing Schlein. DD:  12/05/99 TD:  12/05/99 Job: [id number]

## 2010-11-11 NOTE — Consult Note (Signed)
NAMEMUNTAHA, VERMETTE                ACCOUNT NO.:  [id number]   MEDICAL RECORD NO.:  [id number]          PATIENT TYPE:  INP   LOCATION:  1413                         [facility]ACILITY:  [facility]   PHYSICIAN:  Juan-Carlos Monguilod, M.D.DATE OF BIRTH:  [birth year]   DATE OF CONSULTATION:  02/26/2006  DATE OF DISCHARGE:                                   CONSULTATION   Dr. Felicity Coyer has asked [place] to assist with the pain management of Kaylee Hansen.  Below are our recommendations.   RECOMMENDATIONS:  1. Methadone 5 mg p.o. b.i.d. to be titrated carefully given previous      reactions to opioids.  2. May consider adding Decadron in the future but hold for now.  3. Bowel regimen p.r.n.  4. Agreed with Dilaudid/Benadryl for now.  5. Continue Vicodin for now.   IMPRESSION:  A 55 year old white female with left hip avascular necrosis  found on the kitchen floor by her son after coming in from a walk with her  dog. She does not recall how or why she fell. She reports no usual pain or  aura prior to the fall. She has had ongoing left hip pain for several months  which she attributed to her left knee pain which is a reconstruction.  However, Dr. Enid Baas consultation reveals AVN with subchondral collapse  exacerbated by a fall. Regarding pain management, Kaylee Hansen states that  previous negative experiences with morphine have made her weary of any  medications. Her previous experience with morphine was a reported overdose  and subsequent six-day coma. With oxycodone, she reports that she has  seizure-like activity. She is willing to try methadone starting at a very  low dose initially. On February 25, 2006, she had an intra-articular Depo-  Medrol injection. She reports that she had good pain relief, at times to a  2/10 for approximately 12 hours. Currently, she is at 6/10 in her pain. She  describes her pain as deep and throbbing and causes her to feel weak on her  left side.   PAST MEDICAL HISTORY:  She has  had lung cancer with brain metastases managed  by Dr. Myna Hidalgo. She states that she is now cancer free. Has a history of  hypertension, anxiety, seizure disorder, hypothyroidism, depression,  migraine, irritable bowel syndrome and pancreatitis.   SOCIAL HISTORY:  She continues to smoke a half a pack to one pack per day  and did not return to this conversation given her protracted.   DRUG ALLERGIES:  SHE REPORTS PENICILLIN AND MORPHINE.   PHYSICAL EXAMINATION:  VITAL SIGNS:  97.3, 72, 18, 134/50, 97% on room air.  GENERAL:  Pleasant, cooperative lady sitting in a chair, having her lunch.  HEENT:  Kaylee Hansen has a slight tremor to her voice. I note some word-finding  difficulty. She acknowledges this problem. Oral mucosa is moist. EOMs intact  bilaterally. No JVD or distention.  HEART:  Regular rate and rhythm. I hear no murmur.  LUNGS:  Clear to auscultation bilaterally. No wheezes.  ABDOMEN:  Soft and tender. Bowel sounds are present.  EXTREMITIES:  There is no  edema. She prefers not to move her left leg for me  during this visit.  NEUROLOGICAL:  Cranial nerves II-XII are grossly intact. She remains  coherent throughout our visit and is able to express her desires regarding  pain management as well as her concerns.   LABORATORY DATA:  Her phenytoin level is low at 6.0.   Thank you for this consultation. We will continue to follow the patient with  you.      Governor Specking, N.P.      Rosanne Sack, M.D.  Electronically Signed    TJ/MEDQ  D:  02/28/2006  T:  02/28/2006  Job:  [id number]

## 2010-11-11 NOTE — Discharge Summary (Signed)
[facility]  Patient:    Kaylee Hansen, Kaylee Hansen                    MRN: [id number] Adm. Date:  [id number] Disc. Date: [id number] Attending:  Lyndal Pulley C CC:         Petra Kuba, M.D.             Arvella Merles, M.D.             Jackelyn Knife, M.D.                           Discharge Summary  DISCHARGE DIAGNOSES: 1.  History of metastatic small cell lung carcinoma with no evidence of     recurrence. 2.  Weight loss, inability to eat, possibly secondary to irritable bowel,     slowly improving. 3.  Hypothyroidism, currently hyperthyroid on replacement.  SUMMARY:  The patient is a 55 year old female patient who was admitted with failure to thrive.  PAST MEDICAL HISTORY:  Significant in January of 1999 for a mass with right paratracheal adenopathy with biopsy showing limited stage small cell carcinoma of the lung.  Initial treatment was two cycles of Carboplatin, Taxol chemotherapy last February 1999 when it was stopped due to poor treatment tolerance.  She went on to get some chest radiotherapy by Dr. Dorna Bloom, 4680 rads, also stopped early due to poor tolerance.  She did well until May 2000 when an MRI of the brain showed a new right cerebellar metastasis.  She had palliative brain radiotherapy by Dr. Dorna Bloom.  By October 2000 her scans were negative.  She was admitted in January of this year with failure to thrive.  Upper and lower endoscopy in September have been negative, and repeat upper endoscopy in January of this year has been negative.  Repeat pan scanning was negative.  Barium swallow showed a little delay in initiating swallowing.  She was then admitted again May 4 through May 10 with failure to thrive with CT scanning and total body scan, all negative for recurrent disease.  She was treated for a COPD exacerbation at that time with hypothyroid on her recurrent replacement dose of Synthroid, so it was increased.  She still had the delirium to  steroids, but this had improved by the time she went home.  However, over the past month, she has been unable to eat and had persistent nausea, vomiting and diarrhea.  She lost 12 pounds of weight from May 4 to June 8 documented.  We subsequently admitted her for further workup.  Her admission History and Physical were pretty unremarkable except for being a little tender in the left lower quadrant.  She was a no code blue as before.  COURSE IN THE HOSPITAL:  The patient was essentially started on IV antiemetics around the clock.  We avoided steroid therapy due to her poor tolerance in the past.  Chest X-ray on admission was really unremarkable.  Serum amylase was 62, serum lipase 26.  She was hypokalemic with a potassium of 2.7.  She was noted to have borderline blood pressures, which is not unusual for her.  On replacement by December 04, 1999, her potassium corrected to 3.7, creatinine 1.1.  We tried a trial of Marinol for her appetite, which did not work.  We tried her on a trial of Levsin which helped a little, but not a lot. Her acute abdominal series of note on June  8 have been negative.  GI consult was subsequently called and Dr. Luther Parody helped [place] with the patient.  C-difficile checked was negative.  He felt her diarrhea was probably multifactorial, including irritable bowel symptoms along with collagenous colitis.  A trial of Amytriptyline was initiated by them.  She was also put on a lactose-free diet.  Diarrhea did slowly improve.  She has had a short trial of Remeron and it was stopped.  Multiple other medications including Reglan, Benadryl and Zofran were all discontinued by gastroenterology.  By June 16 her diarrhea had resolved.  Antidiarrheals were slowly tapered down.  She did develop a little bit of confusion and that again stimulated a narrowing down of her medication list.  Of note, she has done that every single time that she has been in the hospital.  We felt  this time that the culprit may well be Marinol.  Her GI symptoms did continue to improve to the point on December 13, 1999, we felt she was ready for discharge home.  Her last CBC from June 16 showed a white count of 5, hemoglobin of 9.6, platelet count of 205,000.  From June 18, sodium 143, potassium 3.7, BUN of less than 5, creatinine .8, calcium 8.1, free TSH of .194 on December 02, 1999, and free T4 of 2.27 which is high, and, thus, the Synthroid was decreased.  5HIAA level was sent and returned 2, which is within normal limits.  Cryptosporidium smear was done of her stool and was negative.  Stool WBC check was done and was negative.  Serum gastrin level was checked and was 103, only slightly high.  Stool O & P was done on December 10, 1999, and was negative.  Screening CT scan with and without contrast was done December 07, 1999, and showed no significant abnormalities.  She was subsequently discharged home on the following medications: 1.  Premarin 1.25 mg p.o. q. day. 2.  Coumadin 1 mg p.o. q. day. 3.  Elavil 50 mg p.o. q. h.s. 4.  Protonix 40 mg p.o. q. day. 5.  Synthroid .112 mg p.o. q. day. 6.  Compazine spansules 15 mg p.o. q. 12 hours for nausea times two weeks,     then off. 7.  Zofran 8 mg p.o. q. 8 hours p.r.n. for nausea.  She will be on home O2 as before.  She will return to see Dr. Ewing Schlein July 12 at 10:15 a.m., and return to see me July 31 at 9:45 a.m.  At time of discharge, her overall status was improved.  PROGNOSIS:  Guarded. DD:  12/19/99 TD:  12/19/99 Job: [id number]

## 2010-11-11 NOTE — Consult Note (Signed)
NAMEERVIN, ROTHBAUER NO.:  [id number]   MEDICAL RECORD NO.:  [id number]          PATIENT TYPE:  INP   LOCATION:  1413                         FACILITY[facility]  [facility]   PHYSICIAN:  Erasmo Leventhal, M.D.DATE OF BIRTH:  06-Sep-1955   DATE OF CONSULTATION:  02/23/2006  DATE OF DISCHARGE:                                   CONSULTATION   HISTORY OF PRESENT ILLNESS:  Ms. Kristensen is a 55 year old female well known to  [facility], followed up by me for knee pain and Dr. Ethelene Hal  for back pain. She has multiple medical problems as listed in the chart. For  the past couple of weeks the patient states that she has had weakness and  fatigue. She had seen her primary care physician on more than one occasion,  Dr. Posey Rea.  Of interest, she ended up yesterday with either a syncopal  versus seizure episode.  She had a fall. She was unable to weight bear after that. She was brought in  to the emergency room and admitted to Dr. Felicity Coyer for medical work up and  unable to weight bear on the left side. Plain x-rays were obtained and an  MRI scan. The patient states that she has no pain in the right hip or groin.  However she has had left hip, groin and leg pain for several months versus  weeks, based upon her history.  Nevertheless, it has been going on for quite  some time. This has gotten worse since the fall. Again, she has no pain in  the right side.   For allergies, medications and medical history see chart.   PHYSICAL EXAMINATION:  GENERAL:  Awake, alert, sleepy. Answers questions  appropriately. Oriented to person, place and time and circumstance.  Accompanied by her husband.  LOWER EXTREMITIES:  Symmetric in length, right lower extremity has full  range of motion at foot, ankle, hip and knee without pain. Normal strength.  Left side - active range of motion is good, passive range of motion is good.  Pain only at extremes. Log-roll was negative, movements  were symmetric. Foot  and ankle and knee negative.  Generalized weakness of left lower extremity  secondary to old cerebrovascular accident.   PLAN:  Plain x-rays on the left side reveal an obvious avascular necrosis  with what looks like early collapse. The right side is really unremarkable.  Bilateral hip MRI scans reveal right early avascular necrosis, left advanced  avascular necrosis with subchondral collapse.   IMPRESSION:  1. Left hip advanced avascular necrosis with subchondral collapse      exacerbated by a fall. Unable to weight bear.  2. Right hip early avascular necrosis asymptomatic at this time.   RECOMMENDATIONS:  At this point in time she is being medically stabilized. I  discussed with patient and her husband just trying an intra-articular  injection by radiology for the left hip pain to see if we can get this  comfortable, allow her to touch down weight bearing on the left. Weight bear  started on the right and possibly get home. I believe  she will require total  hip arthroplasty on the left side for definitive treatment and patient and  husband know that.  If we can get her comfortable and get her better, and  get her home, this can be scheduled electively.  If not, arrangements will  need to be made for sooner. Will get the injection done tomorrow morning. I  have discussed this  with radiology, and will watch her through the weekend.  Dr. Charlann Boxer will  return next week and I will discuss this with him and recommend referral to  Dr. Charlann Boxer for definitive care.   Thank you for the consult.           ______________________________  Erasmo Leventhal, M.D.     RAC/MEDQ  D:  02/23/2006  T:  02/24/2006  Job:  [id number]   cc:   Georgina Quint. Plotnikov, MD  [address]  [place]  [place] [address]

## 2010-11-11 NOTE — H&P (Signed)
[place]  Patient:    Kaylee Hansen, Kaylee Hansen                    MRN: [id number] Adm. Date:  [id number] Attending:  Janan Halter CC:         Miquel Dunn. Catha Gosselin, M.D.             Petra Kuba, M.D.             Jackelyn Knife, M.D.             Arvella Merles, M.D.                         History and Physical  BRIEF HISTORY:  The patient is a 55 year old female patient admitted with failure to thrive.  PAST MEDICAL HISTORY:  Positive January 1999, for massive right peritracheal adenopathy.  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  Treatment was 2 cycles of carboplatin and Paxil last February 1999, and it was stopped due to treatment being tolerated so poorly.  She went on to get chest radiotherapy, Dr. Dorna Bloom, 4680 rads also stopped early due to poor tolerance.  She did well until May 2000, when an MRI of the brain showed a new right cerebellar metastases.  She got palliative brain radiation therapy by Dr. Dorna Bloom. In October of 2000, scans were negative.  She was admitted in January 2001, with failure to thrive.  Upper and lower endoscopy in September had been negative, and repeat upper endoscopy in January of this year was negative.  Repeat pan scanning then was negative. Barium swallow showed a little delay in initiating swallowing.  She was admitted from Oct 28, 1999, to Nov 03, 1999, with failure to thrive.  CT scan of the head, neck, chest, abdomen and pelvis and total body bone scan were all negative for recurrent disease.  She was treated as a COPD exacerbation, unlikely early pneumonia.  She was also noted to be hypothyroid on her current replacement dose so it was increased.  She developed delirium which was steroid related but by the time we sent her home she was back to baseline.  ______ going home over the last month she has not been able to eat with persistent nausea and vomiting and diarrhea which is watery.  She  gave a history of 20 lose bowel movements over the last 24 hours.  She has lost 12 pounds from Oct 28, 1999, to December 02, 1999.  She was subsequently admitted today for failure to thrive and further workup.  PAST MEDICAL HISTORY:  Positive for collagenous colitis, gastroesophageal reflux disease, irritable bowel syndrome, anxiety, depression, hypothyroidism, allergy to penicillin and steroids.  FAMILY HISTORY:  Negative.  SOCIAL HISTORY:  Married, two children, former smoker, lives in [place].  REVIEW OF SYSTEMS:  Basically as above.  PHYSICAL EXAMINATION:  VITAL SIGNS:  Temperature 99.7, pulse 82, respirations 18, blood pressure 93/54.  She is 66 inches tall, weighs 152 pounds.  HEENT:  Oral mucosa is dry.  NECK:  No cervical, supraclavicular or axillary adenopathy.  HEART:  Her S1 is greater than S2 without rubs or gallops.  LUNGS:  Decreased breath sounds bilaterally with no rales, rhonchi, or wheezes.  ABDOMEN:  No hepatosplenomegaly.  Bowel sounds active.  She is a little tender in the left lower quadrant but no mass effect felt.  No inguinal adenopathy.  EXTREMITIES:  No ankle edema.  GENERAL:  She does look chronically ill.  LABORATORY DATA:  CBC from the clinic shows a white count of 8.65 that is a ______ count of 65,000.  Hemoglobin 14.9, platelet count 225,000.  Sodium 137, potassium 2.7, BUN 14, creatinine 1.2, GOT of 12.  GPT of 6.  Serum amylase 62, serum lipase 26.  Chest x-ray negative for any changes.  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  All prior treated and in remission clinically and as of a month ago radiologically.  PLAN:  Will try a trial of Levsin and Lomotil and IV fluids.  Will give IV antiemetics as needed but avoid steroids.  Will get a stool sample for c. difficile toxin.  Repeat her barium swallow Monday, and likely get a GI consult then.  She  will be a No Code Blue as before. DD:  12/02/99 TD:  12/03/99 Job: [id number]

## 2010-11-11 NOTE — Discharge Summary (Signed)
[facility]  Patient:    Kaylee Hansen, Kaylee Hansen Visit Number: [id number] MRN: [id number]          Service Type: MED Location: 3W [id number] 01 Attending Physician:  Anastasio Auerbach Dictated by:   Anastasio Auerbach, M.D. Admit Date:  05/22/2001 Discharge Date: 06/02/2001   CC:         Arvella Merles, M.D.  Lowell C. Catha Gosselin, M.D.  Marlan Palau, M.D.  Petra Kuba, M.D.   Discharge Summary  DATE OF BIRTH:  06-24-1956  DISCHARGE DIAGNOSES:  1. New onset generalized seizures.     a. Left-sided Todds paralysis, resolved.     b. Status epilepticus.     c. High fevers.     d. Rhabdomyolysis.        1. Peak creatinine kinase 10,719, no renal failure, resolved.     e. Etiology likely multifactorial (see discussion).  2. Hypoxic respiratory failure.     a. Aspiration pneumonia/pneumonitis.     b. Transient pulmonary edema.        1. 2-D echocardiogram:  Ejection fraction 60%, moderate aortic           regurgitation.  3. Urinary tract infection.     a. Escherichia coli - pan-sensitive.  4. Dehydration, resolved.  5. Hypokalemia/hypomagnesemia, resolved.  6. Transient diarrhea, resolved.     a. Clostridium difficile negative.  7. Macrocytic anemia (discharge hemoglobin 10.1, mean cell volume 97.     a. Iron deficiency (iron 16, total iron-binding capacity 228, percent        saturation 7).     b. Guaiac-negative.     c. B12 381, red blood cell Hansen 502 - normal.  8. History of remote cerebrovascular accident.     a. Residual mild left-sided weakness.  9. History of small-cell lung cancer, diagnosed 1999.     a. Did not tolerate chemotherapy but did have radiation therapy to chest.     b. 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        1. Radiation to the brain, 10/00, follow-up scans negative.        2. Questionable small area by magnetic resonance imaging this admission           (follow up by Dr. Catha Gosselin). 10. Irritable bowel syndrome/collagenous  colitis.     a. Underwent exploratory surgery in 1995 to make this diagnosis. 11. Hypothyroidism, overreplaced.     a. Thyroid-stimulating hormone 0.076 on 150 mcg. 12. Gastroesophageal reflux disease. 13. Mild chronic obstructive pulmonary disease. 14. Postmenopausal. 15. Depression and anxiety.     a. Chronic benzodiazepine use. 16. Chronic headaches.     a. Chronic narcotic use. 17. Status post hysterectomy. 18. Status post back surgery. 19. ALLERGIES:     a. PENICILLIN.     b. STEROIDS (questionable mental status change).     c. OXYCONTIN (may have contributed to seizures).  DISCHARGE MEDICATIONS:  1. Coumadin 1 mg q.d. (Port-A-Cath in place) - chronic.  2. Trazodone 50 mg p.o. q.h.s.  3. Paxil 40 mg q.d.  4. (Decreased dose) Synthroid 150 mcg 1/2 pill daily (previously 1 daily).  5. (Decreased dose) Xanax 0.5 mg take 1/2 tab q.8h. p.r.n. anxiety - hold for     sedation.  6. Ambien 10 mg p.o. q.h.s. p.r.n. insomnia.  7. Amitriptyline 50 mg at bedtime.  8. Premarin 1.25 mg q.d.  9. (New) iron sulfate 325 mg 1 p.o. b.i.d. until Dr. Nicholos Johns tells you to stop. 10. (  New) Dilantin 100 mg 2 pills in the morning and 2-1/2 in the evening. 11. (New) enteric-coated aspirin 325 mg q.d. 12. (New) Pepcid AC 10 mg p.o. b.i.d. p.r.n. heartburn. 13. Vicodin 5/500, 1 p.o. q.6h. p.r.n. severe pain (dispense #30 - no     refills).  CONDITION UPON DISCHARGE:  Stable.  Patient ambulating with a walker.  Husband able to stay at home with her for the next several days.  RECOMMENDED ACTIVITY:  As tolerated.  Use the walker.  Physical therapy will come to the home.  RECOMMENDED DIET:  As tolerated.  Drink plenty of water.  SPECIAL INSTRUCTIONS:  Call or return if problems occur.  FOLLOW-UP: 1. Dr. Holley Bouche:  The patient is to call and schedule a follow-up    appointment next Friday.  She will need a Dilantin level checked as well as    a basic metabolic panel.  Consider referred for  iron deficiency anemia if    no recent work-up.  Also, consider referred to pain clinic or headache    specialist to help treat her chronic headaches and minimize narcotics. 2. Contact Dr. Catha Gosselin to schedule follow-up in 2-3 weeks.  He will need to    review any MRIs that were done this hospitalization and decide about    further work-up and studies. 3. Neurologist, Dr. Lesia Sago.  Call to schedule a follow-up appointment in    4-6 weeks.  Numbers given.  CONSULTANTS: 1. Lowell C. Catha Gosselin, M.D. 2. Charlcie Cradle. Delford Field, M.D. 3. Ward Roxan Hockey, M.D. 4. Marlan Palau, M.D.  PROCEDURES:  1. Chest x-ray (11/27):  Mild edema.  Left Port-A-Cath.  2. Head CT with and without contrast (11/27):  No acute intracranial     abnormality.  The small hypodense focus in the right caudate nucleus     adjacent to the frontal horn of the right lateral ventricle is not seen on     the exam today.  Following contrast, there was no abnormal enhancement.  3. MRI/MRA brain without contrast (11/27):  Atrophy without evidence of     hydrocephalus or midline shift.  No obvious intracranial hemorrhage.     Nonspecific periventricular and subcortical white matter type changes.  No     evidence of acute infarct.  MRA was unremarkable.  4. Chest x-ray (11/29):  Increase in right upper lobe air space disease.  5. Renal ultrasound:  Normal kidneys.  No hydronephrosis.  6. Chest x-ray (11/30):  Improved aeration.  7. MRI brain with and without contrast (12/02):  Atrophy without shift,     hydro, or hemorrhage.  Nonspecific white matter changes.  No acute     infarct.  No findings to suggest ______  encephalopathy.  Major     structures are patent.  There is the possibility of a 2-3 mm      ring-enhancing lesion within the deep sulci of the right posterior frontal     lobe.  It is not confirmed on axial images.  There was dural enhancement.     No nodularity.  Likely secondary to previous lumbar puncture.  The tiny      enhancement mentioned above could be a small metastatic focus.  Again, not     specific.  8. Lumbar puncture (11/28):  Clear and colorless.  103 RBCs, 90 WBCs, 93%     neutrophils, protein 24, glucose 74, VDRL negative, culture negative.     Herpes simplex PCR negative.  Crypto negative.  9. Carotid Dopplers (  11/29):  No internal carotid artery stenosis.  Vertebral     flow antegrade. 10. EKG:  Sinus tachycardia.  Otherwise normal. 11. 2-D echocardiogram (11/29):  Systolic function normal.  EF 65%.  Aortic     valve thickness mildly increased.  Moderate aortic valvular regurgitation.      Transvalvular gradient was 10 mmHg.  No evidence for mitral valve     prolapse.  There was mild mitral valve regurgitation.  HOSPITAL COURSE: #1 - NEW ONSET SEIZURES:  Kaylee Hansen is a 55 year old Caucasian female with a history of small-cell lung cancer with treated metastases to the brain in 2000.  She also has a history of stroke of the right brain with residual left-sided weakness.  This happened eight years ago.  She presents with less than 24 hours of ataxia and lethargy.  On presentation, she is noted to have spastic left hemiparesis and right eye gaze.  Her temperature was also 101.9. Initial head CT was negative.  Neurology evaluated the patient and agreed with supportive care and MRI.  Aspirin was started empirically.  There was no evidence of stroke on the MRI and with ongoing fevers, lumbar puncture was done.  The initial CSF was not purulent, but studies were sent.  Over the course of 12-24 hours, Kaylee Hansen began to demonstrate movement on both sides of her body consistent with possible seizures.  She was started on Dilantin and Depakote.  During this period of time, she also developed acute respiratory distress, likely aspiration pneumonia/pneumonitis and mild pulmonary edema. She was transferred to the intensive care unit.  Critical care was consulted and followed along.  Fortunately,  Kaylee Hansen did not require intubation and with the addition of Depakote, her seizures resolved.  An EEG was done.  I did not comment on this in the procedures.  The official reading questions a focus of seizure activity; however, Dr. Anne Hahn did review this and felt like it was likely artifact.  Regardless, the clinical diagnosis of seizure disorder was made.  In retrospect, it was likely that the high fevers were the result of her status epilepticus.  We did work those up extensively.  CNS fluid ultimately came back with negative studies as mentioned above.  Her fevers resolved.  Repeat MRI was done.  It still is unclear as to exactly what triggered these seizures.  Kaylee Hansen has had radiation to the brain in the past which can lower the seizure threshold.  She was also recently started on OxyContin which can have effects as well.  I also got the history that she had possibly run out of her Xanax and, of course, withdrawal from this medication can precipitate events as well.  Regardless, she will likely to be on anticonvulsants lifelong given the severity of her seizure.  Dr. Anne Hahn will see her back in about 4-6 weeks.  In the meantime, we will have her primary care doctor follow-up Dilantin levels.  Dr. Anne Hahn is available for any consultation that may be needed before 4-6 weeks.  #2 - RHABDOMYOLYSIS:  Secondary to seizures.  Peak CK was 10,719.  It did return to normal.  She had no renal failure associated with this.  She was dehydrated and did receive a significant amount of IV fluids.  #3 - FEVER:  As I stated above, I think it was more due to the seizure activity.  She did have pyuria but, again, I think the major contributing factor was her seizure disorder.  #4 - URINARY TRACT INFECTION:  Kaylee Hansen  had been having urinary symptoms two days prior.  She had a significant amount of pyuria, and culture did grow pan-sensitive E. coli.  She received a total of 10 days of antibiotics for this.   At the time of discharge, she was asymptomatic.  #5 - ANEMIA:  Kaylee Hansen anemia is macrocytic with an MCV of 97.  Serum studies did reveal an iron deficiency.  Her total iron was 16 with a TIBC of 228 and percent saturation of 7.  She was guaiac-negative this admission.  B12 and Kaylee Hansen were normal.  She has seen Dr. Ewing Schlein in the past for collagenous colitis and irritable bowel symptoms.  I have put her on iron therapy. Dr. Nicholos Johns can recheck iron studies in the future and consider referral to Dr. Ewing Schlein if indicated.  #6 - HISTORY OF CEREBROVASCULAR ACCIDENT:  Tessi did have atrophy noted in the brain and some nonspecific white matter type changes.  I have advised her to take an aspirin daily.  She is on 1 mg of Coumadin daily because of a Port-A-Cath that is in place, and I doubt that these two will have significant interaction.  #7 - HYPOTHYROIDISM:  Overreplaced.  TSH this admission was 0.076.  It is always difficult to interpret a TSH in the setting of an acute illness.  I have told her to take half a Synthroid tablet which will be 75 mcg daily.  She will follow up with Dr. Tiburcio Pea.  #8 - CHRONIC NARCOTIC AND BENZODIAZEPINE USE:  I did talk with her about trying to reduce the use of these medications.  She does suffer from headaches.  She has seen Dr. Meryl Crutch in the past, and he had tried some acupuncture therapies with her at that time which did transiently help.  I encouraged her to seek further care from him if the headaches persisted in order to try to reduce the amount of narcotics and benzodiazepines that she has to take.  #9 - CODE STATUS:  When Britnie presented to the hospital, it looked as though she had had a massive right brain stroke resulting in severe debility.  In speaking with her husband initially, it was felt that given the devastating nature of a stroke and her limited chance of recovery, that in the event of cardiopulmonary arrest, we would not intervene.   Once it was determined that  this was a Todds paralysis secondary to seizure disorder, we reversed that code status decision.  She remained a full code throughout her hospital stay and fortunately did not require this type of care.  DISCHARGE LABORATORY DATA:  Sodium 140, potassium 4.2, chloride 105, bicarb 30, BUN 3, creatinine 1.0, glucose 95.  Hemoglobin 11.6, MCV 98, WBC 4200, platelet count 355.  CK 14, magnesium 2.0.  Spent greater than 30 minutes arranging discharge, filling out instructions, and dictating. Dictated by:   Anastasio Auerbach, M.D. Attending Physician:  Anastasio Auerbach DD:  06/02/01 TD:  06/02/01 Job: [id number]

## 2010-11-11 NOTE — Procedures (Signed)
[facility]  Patient:    DIMPLES, PROBUS Visit Number: [id number] MRN: [id number]          Service Type: MED Location: 3W 4782 01 Attending Physician:  Anastasio Auerbach Dictated by:   Marlan Palau, M.D. Proc. Date: 05/23/01 Admit Date:  05/22/2001   CC:         [facility] [address]   Procedure Report  PROCEDURE:  Lumbar puncture.  HISTORY:  This patient is a 55 year old with a history of oat cell carcinoma who presented to the emergency room on May 22, 2001 with left hemiparesis, right gaze preference, left homonymous visual field deficit, and left ankle clonus. The patient was felt to have a middle cerebral distribution stroke on the right but this has not been apparent on MRI scan procedure of the brain. The patient has begun having jerking episodes involving both upper extremities with a deviation of the head to the left, felt to have frequent seizure events. The patient has been febrile with temperatures up to 104. Lumbar puncture is performed to rule out bacterial, viral, or carcinomatous meningitis, and rule out herpes encephalitis.  DESCRIPTION OF PROCEDURE:  Lumbar puncture was performed with the patient in the fetal position on the left side. The lower back was cleaned with Betadine solution. Approximately 2 cc of 1% Xylocaine was used as a local anesthetic. A 20 gauge spinal needle was inserted in the L3 interspace and approximately 18 cc of clear colorless spinal fluid was removed for testing. Opening pressure was 150 mm of water and the patient tolerated the procedure well. Tube #1 was sent for VDRL, cryptococcal antigen. Tube #2 was sent for herpes and tuberculosis PCR. Tube #3 was sent for cell differential, glucose, protein. Tube #4 was sent for cytology. No complication of the above procedure was noted. Dictated by:   Marlan Palau, M.D. Attending Physician:  Anastasio Auerbach DD:  05/23/01 TD:   05/23/01 Job: [id number]

## 2010-11-11 NOTE — Discharge Summary (Signed)
[place]  Patient:    Kaylee Hansen                     MRN: [id number] Adm. Date:  [id number] Disc. Date: [id number] Attending:  Janan Halter CC:         Florencia Reasons, M.D.             D. Karle Plumber, M.D.                           Discharge Summary  SUMMARY:  The patient is a 55 year old female whose course is well outlined in er admission note from July 08, 1999.  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  Her course initially was positive in January 1999 for development of right paratracheal adenopathy.  Mediastinoscopy by Dr. Edwyna Shell, January 1999, diagnosed small cell lung carcinoma.  At that time bone and brain scans were negative and she had limited stage disease initially treated with two cycles of  carboplatin and Taxol, last February 1999, but very poorly tolerated.  She went on to receive chest radiotherapy, 4680 rads, before it was stopped early due to poor tolerance as well.  She did well until May 2000 when she developed CNS symptoms and was found to have a 7 mm lesion in the right cerebellum.  Neurosurgery saw the patient, deferred on  surgery, and she underwent palliative brain radiation therapy by Dr. Dorna Bloom. General restaging in October actually was negative including a negative CT scan of the ead post treatment.  However, she continued not to do well.  She has had collagenous colitis, followed by Dr. Ewing Schlein.  Upper and lower endoscopies in September were unremarkable.  She  came in for routine appointment but had lost weight and said she could not eat, and subsequently I admitted her for further work-up.  Her admission exam was fairly  unremarkable.  White count was 9.6, hemoglobin 15.3, platelet count 268,000.  COURSE IN THE HOSPITAL:  The patients symptoms continued to be very hard to track. We did have her see GI on July 11, 1999 because she continued to have problems with her  eating.  One thing that we did note was that she was hypothyroid despite her replacement.  Laboratory studies showed her TSH to be 206 and T4 to be 2.6 n July 09, 1999, and her thyroid medication was increased appropriately.  From  January 13, her total protein had been 5.5, albumin 3.2, AST 19, ALT 11, alkaline phosphatase 54, LDH 79.  Potassium then was 2.9 but with replacement by January 17 it was 4.5.  We repeated her TSH on January 14.  It was 138.24.  When we repeated it on July 16, 1999 it had decreased to 17.07.  Radiologic studies done this admission included a CT scan of the head, neck, chest, abdomen, and pelvis.  Total body bone scan was done as well.  Bone scan on July 11, 1999 was negative.  CT scanning of the head through pelvis done July 09, 1999 was negative.  As such, she was still felt to still be in radiologic remission.  She continued to complain of problems swallowing and on July 12, 1999 a modified barium swallow was done that showed a delay in initiating swallowing.  Once initiated, there is no significant abnormality. There is intermittent trace penetration with some thin barium.  Upper endoscopy was performed on  July 11, 1999 by Dr. Matthias Hughs.  Duodenal biopsies showed no active inflammation.  Antrum biopsies showed mild chronic gastritis, but no tumor identified.  She continued to have some problems with her appetite but we could not find any  structural reasons for it.  Bowels were moving okay on evaluation, July 14, 1999.  At that time GI thought there were no specific recommendations.  They did not think further diagnostic testing would be helpful.  They recommended symptomatic management.  We restarted her Remeron.  She got a little bit better by July 16, 1999 and e felt that she had maximized benefit in the acute care setting.  She was subsequently discharged on Synthroid 0.3 mg p.o. q.d.; Remeron 15 mg  p.o. q.h.s.; Restoril 15 mg p.o. q.h.s.; Coumadin 1 mg p.o. q.d.; Protonix 40 mg p.o. q.d.; Premarin 1.25 mg tablets p.o. q.d. for hormone replacement; Compazine Spansules  15 mg p.o. q.12h. around the clock for nausea; Compazine 10 mg p.o. q.4h. p.r.n. for breakthrough nausea; OxyIR 5 mg tablets p.o. q.4h. p.r.n. for pain; albuterol inhaler 2 puffs p.o. q.6h.  DIET:  As tolerated.  ACTIVITY:  As tolerated.  DISCHARGE INSTRUCTIONS:  She would call [phone] for problems or questions and return to see me at [facility] on February 14 at 11:45 a.m.  CONDITION ON DISCHARGE:  At time of discharge her overall status was slightly improved.  Prognosis guarded. DD:  08/04/99 TD:  08/05/99 Job: [id number]

## 2010-11-11 NOTE — H&P (Signed)
[place]  Patient:    Kaylee Hansen                     MRN: [id number] Adm. Date:  [id number] Attending:  Janan Halter Dictator:   Miquel Dunn Catha Gosselin, M.D.                         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  HISTORY OF PRESENT ILLNESS:  Past medical history is positive in January 1999 for development of massive right paratracheal adenopathy.  The abdomen and pelvis were negative.  Mediastinoscopy by Dr. Edwyna Shell January 1999 showed small cell lung carcinoma histologically.  Bone and brain scans then were negative.  She had limited stage disease initially and was treated with two cycles of carboplatin nd Taxol, last in February 1999.  She has not had any chemotherapy since that time. However, she tolerated therapy very poorly and it had to be discontinued.  She underwent chest radiotherapy by Dr. Dorna Bloom, 4680 RADS and it was stopped early secondary to poor tolerance.  Despite all her therapy being stopped early she did well for over a year until ay 2000 when she developed CNS symptoms.  MRI of the brain showed a 6 to 7 mm lesion in the right cerebellum.  Neurosurgery saw the patient and deferred on surgery.  She subsequently underwent palliative brain radiation therapy by Dr. Dorna Bloom.  Last general staging back in October 2000 at [facility] showed a negative CT scan of the head.  CT scan of the chest was negative.  CT scan of the abdomen showed a questionable lesion in the left hepatic lobe but follow-up MRI at [facility] on April 18, 1999 was negative.  Pelvic CT was negative and bone scan was negative.  She has continued, however, not to do well clinically.  She has had a history of cholanginous colitis, initially followed long-term by Dr. Corinda Gubler  but more recently re-worked up by Dr. Ewing Schlein.  Upper and lower endoscopy by him March 01, 1999  showed some mild colitis but no malignant findings.  She came in today for a routine appointment but she had lost 20 pounds since her last appointment over two months and in general had deteriorated considerably. She told me "she couldnt eat".  Her performance status was so poor I admitted her for work-up.  PAST MEDICAL HISTORY:  Significant for gastroesophageal reflux disease, irritable bowel, anxiety, depression, and hypothyroidism.  ALLERGIES:  PENICILLIN.  SOCIAL HISTORY:  Married.  Lives in [place], [place].  Two grown children. Disabled since 1991 due to her colitis.  FAMILY HISTORY:  Essentially negative.  MEDICATIONS:  Home medications include the following: 1.  Synthroid 0.25 mg p.o. q.d. 2.  Premarin 1.25 mg p.o. q.d. 3.  Coumadin 1 mg p.o. q.d.  She does have a port in. 4.  Paxil 20 mg p.o. q.d. 5.  Ambien p.o. q.h.s. 6.  Lorazepam 1 mg p.o. t.i.d. 7.  Prevacid 30 mg p.o. q.d.  PHYSICAL EXAMINATION:  VITAL SIGNS:  Weight 166 pounds.  Blood pressure 87/59, temperature 97.6 degrees, pulse 73, respirations 20.  I should note that on recheck of her blood pressure  here it was 99/52.  HEENT:  Oral mucosa within normal limits.  NECK:  No cervical, supraclavicular, or axillary adenopathy.  HEART:  S1 greater than S2 without rubs or gallops.  LUNGS:  Significant for decreased breath sounds bilaterally but no rales, rhonchi, or wheezes.  ABDOMEN:  It feels as though her liver edge may be down 4 cms in the right costal margin.  Spleen edge is not felt.  Lower abdomen benign.  Bowel sounds active.  Soft, nontender.  EXTREMITIES:  No ankle edema.  NEUROLOGIC:  Seems to have good strength of all four extremities and is ambulating okay.  LABORATORY DATA:  WBC 9.6, hemoglobin 15.3; platelet count 268,000.  In the morning we will order thyroid  function studies as well as a METC LDH.  ASSESSMENT:  Extensive stage small cell carcinoma of the lung with failure to thrive.  PLAN:  Will have to re-stage her to rule out recurrent cancer as the cause of her failure to thrive.  If indeed she has relapsed given the very poor tolerance to  therapy will likely need to get Hospice to see her.  Will recheck her TSH and T4 to make sure she is not hypothyroid again.  Will give IV hydration and push her p.o. intake. DD:  07/09/99 TD:  07/09/99 Job: [id number]

## 2010-11-11 NOTE — H&P (Signed)
NAMELORRENE, [name]   MEDICAL RECORD NO.:  [id number]          PATIENT TYPE:   LOCATION:                                 FACILITY:   PHYSICIAN:  Rosalyn Gess. Norins, M.D. [facility] OF BIRTH:  1955-11-21   DATE OF ADMISSION:  06/10/2004  DATE OF DISCHARGE:                                HISTORY & PHYSICAL   ADMISSION DIAGNOSES:  1.  Chest pain.  2.  Abdominal pain.  3.  Severe headache.   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  Patient reports  she developed a headache 24 hours prior to admission.  She describes it as  her whole skull radiating from back to front and a throbbing headache behind  her eyes.  She has taken hydrocodone with minimal relief.  Patient does have  a history of migraine headaches and has responded to the Imitrex in the past  but has not taken Imitrex for some time or any other products similar to  that.  Of note, she has never had follow-up CT scan of the brain following  an episode of brain mets in 2000, treated with radiation therapy.  In  addition, the patient has been having significant nausea off and on for  weeks but particularly over the last three days.  Patient has a complex GI  history with longstanding collagenous colitis.  She has had left lower  quadrant abdominal pain and was evaluated in March by Dr. Victorino Dike.  She  did have a colonoscopy at that time which was significant for diverticulosis  but no other abnormalities were noted.  She also had an EGD at that time  which showed esophagitis and gastritis that was chronic, duodenitis without  hemorrhage.  Patient also has a history in the past of pancreatitis.  On  examination in the office, patient is significantly  tender in the  epigastrium as well as the left lower quadrant.  Potential diagnoses include  pancreatitis versus diverticulitis.   Patient complains of significant tightness and pressure in the substernal  region of her chest.  She reports that she has had markedly increased  shortness of breath and decreased exercise tolerance.  She has had no  radiation of pain or discomfort to her arm or neck.  She does have nausea  and weakness, as noted.  Cardiac risks are significant with the patient  being a tobacco abuser, having marked hyperlipidemia, very strong positive  family history, being deconditioned.  She does need to be ruled out for  potential MI.   PAST MEDICAL HISTORY:  1.  History of severe anxiety.  2.  History of irritable bowel syndrome.  3.  History of collagenous colitis.  4.  History of hyperthyroid disease.  5.  History of marked hypercholesterolemia.  6.  History of depression.  7.  History of small cell  CA of the lung with radiation therapy.  8.  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  9.  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  10. History of coronary artery disease with cardiac catheterization in 1996      with a 50-70% LAD.   PAST SURGICAL HISTORY:  1.  History of laparotomy.  2.  History of back surgery.  3.  History of appendectomy.  4.  History of cholecystectomy.  5.  History of hysterectomy.   CURRENT MEDICATIONS:  1.  Synthroid 150 mcg daily.  2.  Dilantin 150 mg q.a.m. and 200 mg q.p.m.  3.  Hydrocodone 7.5 mg q.i.d.  4.  Xanax 0.5 mg t.i.d.  5.  Coumadin 1 mg daily.  6.  Paxil 40 mg daily.  7.  Trazodone 50 mg q.h.s.  8.  Ambien 10 mg q.h.s.  9.  Lactulose 1 tablespoon every other day.  10. Vytorin 10/20 daily.  11. Mobic 15 mg daily.   REVIEW OF SYSTEMS:  Patient has had no fevers, sweats, or chills.  She has  had no ophthalmic changes.  She has no GU complaints.   FAMILY HISTORY:  Noncontributory.   SOCIAL  HISTORY:  Patient is here with a significant other.  She does admit  to continuing smoking.   PHYSICAL EXAMINATION:  VITAL SIGNS:  Temperature 98.6, blood pressure  100/40, pulse 96.  Weight 183.  GENERAL:  A chronically ill-appearing woman who is uncomfortable but in no  acute distress.  HEENT:  Normocephalic and atraumatic.  EACs and TM's are unremarkable.  NECK:  Supple.  There is no thyromegaly.  NODES:  No adenopathy is noted in the cervical or supraclavicular regions.  LUNGS:  Clear with good breath sounds with no rales or wheezes.  CARDIOVASCULAR:  A 2+ radial pulse.  She has a quiet precordium with a  regular rate and rhythm without murmurs, rubs or gallops.  ABDOMEN:  Soft.  No guarding or rebound.  No organomegaly is noted.  She has  significant tenderness in the epigastric area to palpation and percussion.  She had marked tenderness in the left lower quadrant.  PELVIC/RECTAL:  Deferred.  EXTREMITIES:  Without clubbing, cyanosis or edema.  NEUROLOGIC:  Nonfocal.   ASSESSMENT/PLAN:  1.  Cardiovascular:  Patient with a history of coronary artery disease by      cardiac catheterization in 1996, which revealed 60-70% left anterior      descending artery, according to her history.  Cath report was not      located on the chart.  Patient's other risk factors include marked      hyperlipidemia, positive family history of tobacco abuse, and lack of      exercise.  Patient does have tightness in her chest, which is a      worrisome symptom.  Plan:  Patient is admitted to the telemetry unit.      We will get cardiac enzyme panels x3.  We will base further      recommendations on test results.  Will start the patient on IV heparin.      She will be given low-dose Lopressor until she is ruled out.  She will      be given sublingual nitroglycerin for acute chest pain.  A cardiology      may be required.  2.  Gastrointestinal:  Patient with a history of collagenous colitis,     history  of pancreatitis, and known  diverticulosis.  She now is      presenting with significant abdominal pain and nausea.  She is tender in      the epigastrium and the left lower quadrant.  Recurrent pancreatitis      versus diverticulitis is on the differential.  Plan:  Laboratories to      include lipase, amylase, CMET and CBC with diff.  If WBC is elevated,      would consider CT of the abdomen and the initiation of antibiotics.  We      will put her to bowel rest with clear liquids only.  IV Demerol and      Phenergan for pain control.  3.  Headache:  Patient with a history of remote migraine that was previously      responsive to Imitrex but has not been taken in years.  She is now      presenting with a severe headache.  Along with this, she has had      significant nausea which may be related to problems #1 and #2 or may be      related to a possible neurologic disease.  Patient does have a history      of metastatic disease to the brain, status post radiation therapy.  She      has had no follow-up brain scan since 2000.  Plan:  We will get a CT      scan of the brain with contrast and will treat her symptoms with      Demerol.       ___________________________________________  Rosalyn Gess. Norins, M.D. [facility]    MEN/MEDQ  D:  06/10/2004  T:  06/10/2004  Job:  [id number]

## 2010-11-11 NOTE — Consult Note (Signed)
[facility]  Patient:    Kaylee Hansen, Kaylee Hansen Visit Number: [id number] MRN: [id number]          Service Type: MED Location: [id number] Attending Physician:  Anastasio Auerbach Dictated by:   Marlan Palau, M.D. Proc. Date: 05/22/01 Admit Date:  05/22/2001   CC:         Anastasio Auerbach, M.D.  Arvella Merles, M.D.  [place] C. Catha Gosselin, M.D.  Petra Kuba, M.D.   Consultation Report  REASON FOR CONSULTATION:  This patient is a 55 year old right-handed white female -- born 1955/10/26 -- with a history of oat cell carcinoma of the lung.  This patient has had chemotherapy and radiation therapy to the brain due to brain metastasis.  Patient has recently been placed on some oxycodone, yesterday, took the medication and began having some nausea and vomiting. Patient apparently vomited throughout the night and this morning, was noted to have multiple falls.  Patient could not get up out of bed without falling. Patient was found to have some left-sided weakness, right gaze preference, urinary incontinence.  Patient was brought to the hospital for further evaluation.  CT scan of the brain was unremarkable, without evidence of acute stroke or hemorrhage.  Patient has continued to have right gaze preference, has a definite homonymous hemianopsia, left hemiparesis with marked increase in motor tone with flexion of the left arm.  Neurology is asked to this patient for further evaluation.  PAST MEDICAL HISTORY:  1. 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  2. Oat cell carcinoma, metastasis to the brain, status post chemotherapy and     radiation therapy; last radiation therapy to the brain was in 2000.  3. History of colitis and irritable bowel syndrome.  4. History of hysterectomy.  5. History of obesity.  6. History of bladder resuspension  procedure.  7. History of hypothyroidism.  8. History of lumbosacral spine surgery.  9. History of gastroesophageal reflux disease. 10. History of COPD. 11. History of depression.  MEDICATIONS:  1. Coumadin 1 mg a day for the Port-A-Cath.  2. Oxycodone if needed; this was just started yesterday.  3. Xanax 0.5 mg one three times a day.  4. Premarin 1.25 mg a day.  5. Amitriptyline 50 mg one q.h.s.  6. Desyrel 50 mg p.o. q.h.s.  7. Ambien 10 mg q.h.s. if needed.  8. Paxil 40 mg a day.  9. Synthroid 0.15 mg daily.  ALLERGIES:  Patient has allergy to MORPHINE which causes her to "crazy" and allergy to PENICILLIN.  SOCIAL HISTORY:  This patient is married.  Husband is a Naval architect.  [place]; has two children with one son with diabetes.  FAMILY MEDICAL HISTORY:  Notable in that mother has history of congestive heart failure, diabetes, stroke and died with these problems.  History concerning the father is unknown.  Patient has one half sister, four half brothers, all alive and well.  REVIEW OF SYSTEMS:  Review of systems is difficult to obtain.  Patient is not highly verbal, will speak in short phrases but will not answer questions well enough to give an accurate review of systems.  PHYSICAL EXAMINATION:  VITALS:  Blood pressure is 138/54; heart rate 107; respiratory rate 24; temperature 101.9, rectally.  GENERAL:  This patient is a moderately obese white female who is alert,  will cooperate to some degree with examiner.  HEENT:  Head is atraumatic.  Eyes:  Pupils are equal, round and react to light.  Disks are flat bilaterally.  NECK:  Supple.  No carotid bruits are noted.  RESPIRATORY:  Occasional rhonchi.  CARDIOVASCULAR:  Regular rate and rhythm without obvious murmurs or rubs noted.  EXTREMITIES:  Without significant edema.  NEUROLOGIC:  Cranial nerves as above.  Facial symmetry is not present but some flattening of the left  nasolabial fold and patient has definite right gaze preference, has evidence of a left homonymous hemianopsia by threat.  Speech seems to be well-enunciated, not aphasic when she does talk.  Patient has flexion increased tone of the left arm, extension increased tone of the left leg.  Patient is able to move the right side of the body fairly well and to command.  Will not move the left side of the body to command.  Deep tendon reflexes are present, somewhat increased on the left side.  Babinski is noted on the left.  Sustained clonus at the ankle noted on the left, not present on the right.  Patient is unable to follow commands well enough for cerebellar testing and could not be ambulated.  Patient perseverates with sensory testing, difficult to ascertain actual sensation to pinprick, soft touch and vibratory sensation.  IMAGING STUDY:  CT of the head is unremarkable.  LABORATORY VALUES:  Notable for a urinalysis with specific gravity of 1.015, pH of 8.0, 30 mg/dl of protein, large amount of esterase, negative nitrites. The rest of the laboratory values are pending at this time.  IMPRESSION:  1. History of oat cell carcinoma of the lung, metastatic to brain in 2000.     Patient has had radiation therapy and chemotherapy previously.  2. New onset of left hemiparesis, right gaze preference, left homonymous     hemianopsia consistent with a right middle cerebral artery distribution     stroke.  3. Febrile illness, rule out pneumonia or urinary tract infection.  This patient has evidence of a right middle cerebral artery distribution stroke event.  Do need to consider the possibility of an embolic stroke event  with the significant nausea and vomiting that occurred after taking oxycodone yesterday; need to rule out the possibility of carotid dissection.  At any rate, this patient sustained a large right brain stroke, likely has involvement in the deeper structures such as the basal  ganglia areas.  Patient does have some left-sided neglect on examination.  PLAN:  1. Would not use IV heparin for now.  2. Subcu heparin.  3. MRI of the brain.  4. MR angiogram.  5. Carotid Doppler study.  6. Two-dimensional echocardiogram.  7. N.p.o. for now.  8. Speech, occupational and physical therapy evaluations when appropriate.     Need to watch out for significant brain edema over the next four days or     so.  9. Will follow patient clinically while in house.  Thank you very much. Dictated by:   Marlan Palau, M.D. Attending Physician:  Anastasio Auerbach DD:  05/22/01 TD:  05/23/01 Job: [id number]

## 2010-11-11 NOTE — Op Note (Signed)
[place]  Patient:    Kaylee Hansen                     MRN: [id number] Proc. Date: 07/11/99 Adm. Date:  [id number] Attending:  Janan Halter CC:         Miquel Dunn. Catha Gosselin, M.D.             Meredith Staggers, M.D.                           Operative Report  PROCEDURE:  Upper endoscopy with biopsies.  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  FINDINGS:  Normal examination.  No source of nausea and vomiting seen.  The purpose and risks of the procedure were reviewed with the patient who provided written consent.  She was familiar with it from her prior endoscopy several months ago.  SEDATION:  Fentanyl 75 mcg and Versed 9 mg IV without arrhythmias or desaturation.  DESCRIPTION OF PROCEDURE:  The Olympus video endoscope was passed under direct vision.  The vocal cords were not well seen.  The esophagus was quite easily entered and had a normal appearance, without evidence of reflux esophagitis, varicies, infection, neoplasia, rings, strictures, or any significant hiatal hernia. The stomach was entered.  It contained a small bilious residual and a little bit of mucous, but no retained food.  The gastric mucosa was unremarkable, specifically without evidence of gastritis, erosions, ulcers, polyps, or masses and a retroflexed view of the proximal stomach was normal, without evidence of any hiatal hernia.  The pyloris was somewhat spastic or seemed to have somewhat increased muscle tone, but it was not stenotic and the scope popped through it fairly easily after applying a little bit of general pressure.  The duodenal bulb and second duodenum were normal in appearance.  Random biopsies were obtained from the duodenum and antrum of the stomach prior  to  removal of the scope.  The pyloric channel was reinspected to help make sure  that I was not missing a pyloric channel ulceration.  The patient tolerated this procedure well and there were no apparent complications.  IMPRESSION: 1. Normal upper endoscopy. 2. No source of nausea, vomiting, anorexia, or weight loss identified.  PLAN:  1) Await pathology on random biopsies obtained today.  2) Obtain gastric  emptying scan in the morning to look for functional impediment to gastric emptying. DD:  07/11/99 TD:  07/11/99 Job: [id number]

## 2010-11-11 NOTE — Consult Note (Signed)
[place]  Patient:    Kaylee Hansen                     MRN: [id number] Proc. Date: 07/11/99 Adm. Date:  [id number] Attending:  Janan Halter CC:         Miquel Dunn. Catha Gosselin, M.D.             Meredith Staggers, M.D.                          Consultation Report  REASON FOR CONSULTATION:  Dr. Catha Gosselin asked [place] to see this 55 year old female because of food intolerance, failure to thrive, and severe weight loss.  HISTORY OF PRESENT ILLNESS:  Kaylee Hansen was admitted to the hospital several days ago, out of the office for what had been a routine appointment, because of progressively severe problems with nausea, vomiting, food intolerance, and weight loss.  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  She then underwent GI evaluation by my partner, Dr. Vida Rigger, who is out of town at the current time, which included upper endoscopy, colonoscopy, and Upper GI Series with Small Bowel Followthrough, and a CT scan of the abdomen and pelvis.  All of these exams were basically normal.  On taking the patients history, she points out that she has actually had periodic vomiting for the past nine months or so and it would occur for perhaps a day or two without any obvious provocative factor and would then resolve spontaneously. More recently, however, the frequency of these episodes has increased to the point where now it is virtually a daily occurrence, pretty much with every meal, leading to  food avoidance behavior and a 50 pound weight loss over the past several months, which she believes is still continuing.  Also of pertinence is a stated history of "collagenous colitis", initially diagnosed in 1991 by Dr. Victorino Dike, although multiple random mucosal biopsies  obtained by  Dr. Ewing Schlein just four months ago showed only chronic inflammation without specific findings of collagenous colitis.  Nonetheless, the patient has maintained a bowel habit over the years, including recently, with no formed stools whatsoever, rather, just passage of a small amount of liquid brown stool each time she urinates and in fact this occurs nocturnally roughly every 90 minutes or so.  As far as he patient is aware, she has never previously been treated for her collagenous colitis.  Since admission, the patient has had extensive CT scan evaluation including a CT of the head, neck, chest, abdomen, and pelvis, all of which failed to identify any  obvious abnormality or explanation for her symptoms of nausea, vomiting, and weight loss.  The patient has been on a clear liquid diet and apparently vomiting has not been observed by the nurses for the past day or two, and only occasional stools have  been recorded.  ALLERGIES:  PENICILLIN.  OUTPATIENT MEDICATIONS:  Synthroid, low-dose Coumadin (for Port-A-Cath patency), Prevacid, Premarin, Paxil, Ambien, lorazepam, and more recently she was started on Remeron.  PAST SURGICAL HISTORY:  Operations include extensive "exploratory" surgery here in [place] in 1991 when she presented with nausea, vomiting, and diarrhea, initially, before the diagnosis of collagenous colitis was made.  She is also status post a remote appendectomy as  well as a hysterectomy in 1996.  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  There is also a remote history of peptic ulcer disease with an ulcer about 24 years ago.  HABITS:  The patient used to smoke but stopped smoking when her lung cancer  was  diagnosed.  She never smoked as much as a pack a day.  She is a nondrinker.  FAMILY HISTORY:  Negative for GI tract illnesses such as colon cancer, ulcers, gallstones, or inflammatory bowel disease.  SOCIAL HISTORY:  The patient is married and lives in [place].   She is on disability due to her colitis.  REVIEW OF SYSTEMS:  The patient has a sense of a fullness in her esophagus or at the base of her throat, but no odynophagia or dysphagia, and no chest pain or heartburn.  No abdominal pain or recent change in her chronic diarrheal bowel habit.  She does report some occasional or intermittent shortness of breath and  joint pains in the knees and lumbar spine.  No problem with melena or hematochezia.  PHYSICAL EXAMINATION:  GENERAL:  This is a soft-spoken somewhat overweight (despite her 50 pound weight loss) Caucasian female in no evident distress.  She is anicteric and without pallor.  VITAL SIGNS:  Blood pressure running 90/62, pulse 90, respirations 20, afebrile.  HEENT:  She has partial alopecia, no scleral icterus.  CHEST:  Clear to auscultation.  HEART:  No gallops, rubs, murmurs, clicks, or arrhythmias appreciated.  ABDOMEN:  Somewhat hyperactive but nonobstructive bowel sounds.  No succussion splash.  No organomegaly palpated but there is what appears to be diffuse voluntary guarding and a rather firm abdomen precluding optimal palpation.  No peritoneal  findings, no succussion splash.  RECTAL:  Rectal exam by Burgess Amor, P.A.-C. showed no masses and the Hemoccult negative stool residue.  LABORATORY DATA:  On admission the patient had a potassium of 2.9, which has improved to 3.7 following supplementation.  Liver chemistries are normal. Albumin prior to hydration was slightly low at 3.2.  TSH elevated at 206.  IMPRESSION: 1.  Food intolerance, characterized as nausea and periodic vomiting, apparently  improved over the past couple of days, but with a  substantial progression in     recent months from a sporadic occurrence to an almost daily occurrence.  2.  Weight loss, severe, documented (20 to 50 pounds). 3.  Diarrhea, chronic. 4.  History of "collagenous colitis", although recent biopsies did not confirm     that diagnosis, and apparently it has never been treated. 5.  History of various GI conditions including GERD, IBS, and remote history of  peptic ulcer disease. 6.  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 7.  History of anxiety, depression, and medical disability. 8.  Status post hysterectomy and appendectomy.  DISCUSSION:   Overall, this is a rather nonspecific picture.  Although diagnoses such as gastric outlet obstruction, small bowel obstruction from adhesions (since she had exploratory surgery), or gastric dysmotility come to mind, I think there is a fairly high probability that, given the nonspecific character of the patients  symptoms, a specific disorder to account for them will not be identified.  One wonders whether the patients nausea  and vomiting may be of central rather than I tract origin.  PLAN: 1.  Upper endoscopy today, for which I have discussed the purpose and risks with     the patient and she is agreeable to proceed.  This should help [place] exclude     anatomic gastric outlet obstruction and bile reflux. 2.  I had originally planned to do a flexible sigmoidoscopy as well but since I     have been able to confirm that her colonoscopic biopsies just four months ago     did not show evidence of collagenous colitis, and since the patients symptoms     have progressed, I do not feel that it is necessary to repeat a sigmoidoscopy     at this time. 3.  Probable gastric emptying scan in the morning if todays endoscopic procedure     is unrevealing.  DD:  07/11/99 TD:  07/11/99 Job: [id number]

## 2010-11-11 NOTE — Op Note (Signed)
Kaylee Hansen, Kaylee Hansen NO.:  [id number]   MEDICAL RECORD NO.:  [id number]          PATIENT TYPE:  INP   LOCATION:  1516                         FACILITY:  [facility]   PHYSICIAN:  Madlyn Frankel. Charlann Boxer, M.D.  DATE OF BIRTH:  [birth year]   DATE OF PROCEDURE:  03/02/2006  DATE OF DISCHARGE:  02/24/2006                                 OPERATIVE REPORT   PREOPERATIVE DIAGNOSIS:  Left hip avascular necrosis.   POSTOPERATIVE DIAGNOSIS:  Left hip avascular necrosis.   PROCEDURE:  Left total hip replacement.   COMPONENTS USED:  DePuy hip system size 50 ASR cup and a size 12.5 high  offset tri-lock stem with a 45 ASR ball with a +2 adaptor.   SURGEON:  Madlyn Frankel. Charlann Boxer, M.D.   ASSISTANT:  Jamelle Rushing, P.A.C.   ANESTHESIA:  General.   BLOOD LOSS:  250.   COMPLICATIONS:  None.   INDICATION FOR PROCEDURE:  Kaylee Hansen is a 55 year old female who presented  to the hospital with an intractable left hip pain.  Workup included multiple  studies, but ultimately hip x-rays and MRI which revealed left hip AVN with  collapse.  She also had some right hip AVN which was found to be minor,  without symptoms.  I was consulted to ask for assistance.  She had the  consultation with palliative care for pain management but not for any end-of-  life issues.  After reviewing with she and her husband current treatment  options, they decided that the surgery to remove hip pain would be better  than long term narcotics.  I reviewed with them the risks of dislocation,  infection, nerve damage.  She does have a history of hemiparesis on this  left side from history of stroke.  Because of this, I discussed with them  varying surfaces including the option of a large head size which would help  to prevent dislocation.  Given all of these parameters, consent was  obtained.   PROCEDURE IN DETAIL:  The patient was brought to the operative theater.  Once adequate anesthesia and preoperative antibiotics,  1 g of Ancef, were  administered, the patient was positioned in the right lateral decubitus  position with left side up.  The left lower extremity was then prepped and  draped in a sterile fashion following a pre-scrub.  A posterolateral  incision was made for a posterior approach to the hip.  The iliotibial band  and gluteal fascia was incised in line with the incision.  The short  external rotators were taken down separate from the posterior capsule which  was saved for later repair as an L capsulotomy.  The hip was dislocated,  femoral neck was osteotomized based off the anatomic landmarks and  preoperative templating based on relationship of the tip of the trochanter  to the head.  The femoral head was noted to have collapse in the superior  anterior aspect of the head as expected radiographically.   The femoral preparation was then carried out at this point per routine with  broaching.  I broached this all the  way up to 12.5, which had excellent  purchase.  At this point I then limit to the acetabulum.  Acetabular  exposure was obtained including labrectomy, followed by retractor placement.  Commencing with a 45 reamer, I reamed up to a 49 reamer with good boney bed  preparation.  The the final 50 ASR cup was then brought to the field and  impacted using the curve impactor.  Once I was happy this was secured, then  about 22 degrees of forward flexion and 5-7 mm beneath the anterior rim  attention was redirected back to the femur.  The trial broach was then  placed.   I was happy with the high offset stem with +2 adaptor, as there was 1 mm or  less of shuck, leg length appeared to be okay given overall concern about  the instability issues with her stroke.   Given this, the final components were brought to the field then impacted.  After removal of trial components, the final 12.5 high offset stem was then  impacted to the level where the broach was.  Given this, the +2 adaptor  was  placed on the 45 ball and impacted onto a dried trunnion.  The hip was  relocated.   Note that the hip was irrigated throughout the case and at this point there  was no significant hemostasis.  I was able to reapproximate the posterior  capsule, release it through the superior leaflet in an anatomic fashion.  The remaining wound was closed in layers including #1 Ethibond on the  iliotibial band and #1 Vicryl on the gluteal fascia.  On the skin, #4-0  Monocryl was used with Steri-Strips.  The wound was then cleaned, dressed  sterilely.  The patient was taken extubated to the recovery room in stable  condition.      Madlyn Frankel Charlann Boxer, M.D.  Electronically Signed     MDO/MEDQ  D:  03/03/2006  T:  03/04/2006  Job:  [id number]

## 2010-11-11 NOTE — Op Note (Signed)
NAMETINISHA, ETZKORN NO.:  [id number]   MEDICAL RECORD NO.:  [id number]          PATIENT TYPE:  AMB   LOCATION:  DAY                          FACILITY:  [facility]   PHYSICIAN:  Erasmo Leventhal, M.D.DATE OF BIRTH:  Apr 24, 1956   DATE OF PROCEDURE:  02/02/2005  DATE OF DISCHARGE:                                 OPERATIVE REPORT   INDICATIONS FOR PROCEDURE:  A 55 year old female with left knee pain and  dysfunction not responsive to conservative treatment.   INDICATIONS FOR OUTPATIENT:  Not required.   PREOPERATIVE DIAGNOSIS:  Left knee, possible torn meniscus, possible  chondromalacia with pseudocatching, internal derangement.   POSTOPERATIVE DIAGNOSIS:  Left knee chondromalacia with pseudocatching,  grade 3 patella, grade 3-4 femoral trochlea,  grade 2 medial and lateral  compartment.   PROCEDURE:  Left knee arthroscopic chondroplasty and patella, femoral  trochlea, medial and lateral compartments and synovectomy.   SURGEON:  Dr. Erasmo Leventhal.   ASSISTANT:  Jaquelyn Bitter. Chabon, PA-C.   ANESTHESIA:  Local block with MAC.   BLOOD LOSS:  Less than 10 cc.   DRAINS:  None.   COMPLICATIONS:  None.   TOURNIQUET TIME:  None.   DISPOSITION:  To PACU stable without details.   Patient is counseled in the holding area.  Correct side was identified.  The  IV was started.  Antibiotics were given.  Block was administered.  Taken to  the OR, placed in supine position with MAC anesthesia.  Placed __________.  Prepped and draped in a sterile fashion.  A standard three portal  arthroscopy was performed.  The proximal, medial, anterolateral portal.  Diagnostic arthroscopy undertaken.  The patellofemoral joint revealed normal  tracking with some grade 3 chondromalacia.  A mechanical chondroplasty  performed.  A synovectomy was also performed.  The femoral trochlea revealed  an area of grade 4 chondromalacia in the central aspect and was debrided  back to  stable base with a mechanical shaver.  ACL and PCL were intact.  Medial side inspected.  There was grade 2 chondromalacia.  _________ and  chondroplasty performed.  The medial meniscus was intact.  Lateral side was  inspected.  Grade 2 chondromalacia __________.  Lateral meniscus was intact.  A synovectomy was also performed.  Anteromedially and anterolaterally.  Irrigated.  All arthroscopic equipment was removed.  The three portals were  closed with 4-0 nylon suture.  Another 15 cc of 0.25% Marcaine was placed in  the portal sites.  Intra-articular sterile compressive dressing was applied  along with TED hose and ice pack.  There were no complications.  She was  gently awakened and taken from the operating room to the PACU in stable  condition.  Patient was doing well in the recovery room at the time of this  dictation.        RAC/MEDQ  D:  02/02/2005  T:  02/02/2005  Job:  [id number]

## 2010-11-18 ENCOUNTER — Other Ambulatory Visit (INDEPENDENT_AMBULATORY_CARE_PROVIDER_SITE_OTHER): Payer: 59

## 2010-11-18 ENCOUNTER — Ambulatory Visit (INDEPENDENT_AMBULATORY_CARE_PROVIDER_SITE_OTHER): Payer: 59 | Admitting: Internal Medicine

## 2010-11-18 ENCOUNTER — Encounter: Payer: Self-pay | Admitting: Internal Medicine

## 2010-11-18 ENCOUNTER — Telehealth: Payer: Self-pay | Admitting: Internal Medicine

## 2010-11-18 ENCOUNTER — Other Ambulatory Visit: Payer: Self-pay | Admitting: *Deleted

## 2010-11-18 DIAGNOSIS — I10 Essential (primary) hypertension: Secondary | ICD-10-CM

## 2010-11-18 DIAGNOSIS — E2749 Other adrenocortical insufficiency: Secondary | ICD-10-CM

## 2010-11-18 DIAGNOSIS — R569 Unspecified convulsions: Secondary | ICD-10-CM

## 2010-11-18 DIAGNOSIS — G43909 Migraine, unspecified, not intractable, without status migrainosus: Secondary | ICD-10-CM

## 2010-11-18 DIAGNOSIS — E559 Vitamin D deficiency, unspecified: Secondary | ICD-10-CM

## 2010-11-18 DIAGNOSIS — C3490 Malignant neoplasm of unspecified part of unspecified bronchus or lung: Secondary | ICD-10-CM

## 2010-11-18 DIAGNOSIS — G4700 Insomnia, unspecified: Secondary | ICD-10-CM

## 2010-11-18 DIAGNOSIS — F411 Generalized anxiety disorder: Secondary | ICD-10-CM

## 2010-11-18 DIAGNOSIS — Z8679 Personal history of other diseases of the circulatory system: Secondary | ICD-10-CM

## 2010-11-18 LAB — CORTISOL: Cortisol, Plasma: 7.8 ug/dL

## 2010-11-18 LAB — COMPREHENSIVE METABOLIC PANEL
ALT: 16 U/L (ref 0–35)
AST: 13 U/L (ref 0–37)
Albumin: 3.3 g/dL — ABNORMAL LOW (ref 3.5–5.2)
Alkaline Phosphatase: 104 U/L (ref 39–117)
BUN: 13 mg/dL (ref 6–23)
CO2: 30 mEq/L (ref 19–32)
Calcium: 9.3 mg/dL (ref 8.4–10.5)
Chloride: 104 mEq/L (ref 96–112)
Creatinine, Ser: 1.2 mg/dL (ref 0.4–1.2)
GFR: 51.71 mL/min — ABNORMAL LOW (ref >60.00)
Glucose, Bld: 98 mg/dL (ref 70–99)
Potassium: 4.6 mEq/L (ref 3.5–5.1)
Sodium: 138 mEq/L (ref 135–145)
Total Bilirubin: 0.2 mg/dL — ABNORMAL LOW (ref 0.3–1.2)
Total Protein: 6.2 g/dL (ref 6.0–8.3)

## 2010-11-18 LAB — TSH: TSH: 1.66 u[IU]/mL (ref 0.35–5.50)

## 2010-11-18 LAB — URIC ACID: Uric Acid, Serum: 3.3 mg/dL (ref 2.4–7.0)

## 2010-11-18 MED ORDER — ALPRAZOLAM 0.25 MG PO TABS: 0.2500 mg | ORAL_TABLET | Freq: Three times a day (TID) | ORAL | Status: DC | PRN

## 2010-11-18 MED ORDER — PAROXETINE HCL 40 MG PO TABS: 40.0000 mg | ORAL_TABLET | ORAL | Status: DC

## 2010-11-18 MED ORDER — PHENYTOIN SODIUM EXTENDED 100 MG PO CAPS: ORAL_CAPSULE | ORAL | Status: DC

## 2010-11-18 MED ORDER — TRAZODONE HCL 100 MG PO TABS: 100.0000 mg | ORAL_TABLET | Freq: Every day | ORAL | Status: DC

## 2010-11-18 MED ORDER — ESOMEPRAZOLE MAGNESIUM 40 MG PO CPDR: 40.0000 mg | DELAYED_RELEASE_CAPSULE | Freq: Every day | ORAL | Status: DC

## 2010-11-18 MED ORDER — ZOLMITRIPTAN 5 MG PO TBDP: 5.0000 mg | ORAL_TABLET | ORAL | Status: DC | PRN

## 2010-11-18 MED ORDER — PROMETHAZINE HCL 25 MG PO TABS: 25.0000 mg | ORAL_TABLET | Freq: Every day | ORAL | Status: DC

## 2010-11-18 MED ORDER — HYDROCODONE-ACETAMINOPHEN 10-325 MG PO TABS: 1.0000 | ORAL_TABLET | Freq: Four times a day (QID) | ORAL | Status: DC | PRN

## 2010-11-18 MED ORDER — LEVOTHYROXINE SODIUM 150 MCG PO TABS: 150.0000 ug | ORAL_TABLET | Freq: Every day | ORAL | Status: DC

## 2010-11-18 MED ORDER — VITAMIN D 1000 UNITS PO TABS: 1000.0000 [IU] | ORAL_TABLET | Freq: Every day | ORAL | Status: DC

## 2010-11-18 MED ORDER — POLYETHYLENE GLYCOL 3350 17 GM/SCOOP PO POWD: 17.0000 g | Freq: Two times a day (BID) | ORAL | Status: DC

## 2010-11-18 MED ORDER — GABAPENTIN 100 MG PO CAPS: 100.0000 mg | ORAL_CAPSULE | Freq: Three times a day (TID) | ORAL | Status: DC

## 2010-11-18 MED ORDER — ALBUTEROL SULFATE (2.5 MG/3ML) 0.083% IN NEBU: 2.5000 mg | INHALATION_SOLUTION | Freq: Four times a day (QID) | RESPIRATORY_TRACT | Status: DC

## 2010-11-18 NOTE — Assessment & Plan Note (Signed)
On Rx 

## 2010-11-18 NOTE — Assessment & Plan Note (Signed)
Doing OK - sleeps a lot

## 2010-11-18 NOTE — Assessment & Plan Note (Signed)
BP is low - not on Rx now

## 2010-11-18 NOTE — Telephone Encounter (Signed)
Kaylee Hansen, please, inform patient that all labs are OK Thx   

## 2010-11-18 NOTE — Progress Notes (Signed)
  Subjective:    Patient ID: Kaylee Hansen, female    DOB: 04/13/1956, 55 y.o.   MRN: [id number]  HPI  The patient presents for a follow-up of  chronic hypotension, chronic dyslipidemia, COPD and LBP controlled with medicines    Review of Systems  Constitutional: Negative for appetite change.  HENT: Negative for nosebleeds and dental problem.   Eyes: Positive for redness.  Respiratory: Negative for cough.   Cardiovascular: Negative for leg swelling.  Gastrointestinal: Negative for constipation.  Musculoskeletal: Negative for back pain and joint swelling.  Skin: Negative for pallor.  Neurological: Negative for headaches.  Psychiatric/Behavioral: Positive for sleep disturbance (sleepy). Negative for dysphoric mood. The patient is not nervous/anxious.        Objective:   Physical Exam  Constitutional: She appears well-developed and well-nourished. No distress.  HENT:  Head: Normocephalic.  Right Ear: External ear normal.  Left Ear: External ear normal.  Nose: Nose normal.  Mouth/Throat: Oropharynx is clear and moist.  Eyes: Conjunctivae are normal. Pupils are equal, round, and reactive to light. Right eye exhibits no discharge. Left eye exhibits no discharge.  Neck: Normal range of motion. Neck supple. No JVD present. No tracheal deviation present. No thyromegaly present.  Cardiovascular: Normal rate, regular rhythm and normal heart sounds.   Pulmonary/Chest: No stridor. No respiratory distress. She has no wheezes.  Abdominal: Soft. Bowel sounds are normal. She exhibits no distension and no mass. There is no tenderness. There is no rebound and no guarding.  Musculoskeletal: She exhibits no edema and no tenderness.  Lymphadenopathy:    She has no cervical adenopathy.  Neurological: She displays normal reflexes. No cranial nerve deficit. She exhibits normal muscle tone. Coordination normal.  Skin: No rash noted. No erythema.  Psychiatric: Thought content normal.       Somnolent            Assessment & Plan:  Essential hypertension, benign BP is low - not on Rx now  ANXIETY Doing OK - sleeps a lot  VITAMIN D DEFICIENCY On Rx  MINERALOCORTICOID DEFICIENCY On Rx  INSOMNIA, PERSISTENT On Rx  MIGRAINE HEADACHE On Rx  SEIZURE DISORDER On Rx  CEREBROVASCULAR ACCIDENT, HX OF On Rx

## 2010-11-19 LAB — PHENYTOIN LEVEL, TOTAL: Phenytoin Lvl: 13.6 ug/mL (ref 10.0–20.0)

## 2010-11-23 NOTE — Telephone Encounter (Signed)
Pt informed

## 2011-02-14 ENCOUNTER — Ambulatory Visit (INDEPENDENT_AMBULATORY_CARE_PROVIDER_SITE_OTHER): Payer: 59 | Admitting: Internal Medicine

## 2011-02-14 ENCOUNTER — Encounter: Payer: Self-pay | Admitting: Internal Medicine

## 2011-02-14 VITALS — BP 116/54 | HR 96 | Temp 98.9°F | Resp 16 | Wt 178.0 lb

## 2011-02-14 DIAGNOSIS — G43909 Migraine, unspecified, not intractable, without status migrainosus: Secondary | ICD-10-CM

## 2011-02-14 DIAGNOSIS — G43511 Persistent migraine aura without cerebral infarction, intractable, with status migrainosus: Secondary | ICD-10-CM

## 2011-02-14 MED ORDER — MEPERIDINE HCL 50 MG/ML IJ SOLN
50.0000 mg | Freq: Once | INTRAMUSCULAR | Status: AC
  Administered 2011-02-14 (×2): 50 mg via INTRAMUSCULAR

## 2011-02-14 MED ORDER — ZOLMITRIPTAN 5 MG PO TBDP: 5.0000 mg | ORAL_TABLET | ORAL | Status: DC | PRN

## 2011-02-14 MED ORDER — METHYLPREDNISOLONE ACETATE 80 MG/ML IJ SUSP: 120.0000 mg | Freq: Once | INTRAMUSCULAR | Status: DC

## 2011-02-14 MED ORDER — PROMETHAZINE HCL 25 MG PO TABS: 25.0000 mg | ORAL_TABLET | Freq: Every day | ORAL | Status: DC

## 2011-02-14 MED ORDER — PROMETHAZINE HCL 50 MG/ML IJ SOLN
50.0000 mg | Freq: Four times a day (QID) | INTRAMUSCULAR | Status: DC | PRN
  Administered 2011-02-14: 50 mg via INTRAMUSCULAR

## 2011-02-14 NOTE — Progress Notes (Signed)
  Subjective:    Patient ID: Kaylee Hansen, female    DOB: 07-04-1955, 55 y.o.   MRN: [id number]  HPI  C/o severe HA w/nausea x 3 days - not eating, weak; worse today. Home meds, incl Zomig did not work  Review of Systems  Constitutional: Positive for fatigue. Negative for chills, activity change, appetite change and unexpected weight change.  HENT: Negative for congestion, mouth sores, neck stiffness and sinus pressure.   Eyes: Positive for photophobia, pain and visual disturbance.  Respiratory: Negative for cough and chest tightness.   Gastrointestinal: Positive for nausea. Negative for abdominal pain.  Genitourinary: Negative for frequency, difficulty urinating and vaginal pain.  Musculoskeletal: Negative for back pain and gait problem.  Skin: Negative for pallor and rash.  Neurological: Positive for headaches. Negative for dizziness, tremors, weakness and numbness.  Psychiatric/Behavioral: Negative for confusion and sleep disturbance.       Objective:   Physical Exam  Constitutional: She appears well-developed and well-nourished. She appears distressed (in a dark room; laying in pain).  HENT:  Head: Normocephalic.  Right Ear: External ear normal.  Left Ear: External ear normal.  Nose: Nose normal.  Mouth/Throat: Oropharynx is clear and moist.  Eyes: Conjunctivae are normal. Pupils are equal, round, and reactive to light. Right eye exhibits no discharge. Left eye exhibits no discharge.  Neck: Normal range of motion. Neck supple. No JVD present. No tracheal deviation present. No thyromegaly present.       supple  Cardiovascular: Normal rate, regular rhythm and normal heart sounds.   Pulmonary/Chest: No stridor. No respiratory distress. She has no wheezes.  Abdominal: Soft. Bowel sounds are normal. She exhibits no distension and no mass. There is no tenderness. There is no rebound and no guarding.  Musculoskeletal: She exhibits tenderness (LS). She exhibits no edema.    Lymphadenopathy:    She has no cervical adenopathy.  Neurological: She displays normal reflexes. No cranial nerve deficit. She exhibits normal muscle tone. Coordination normal.  Skin: No rash noted. No erythema.  Psychiatric: She has a normal mood and affect. Her behavior is normal. Judgment and thought content normal.          Assessment & Plan:    A complex acute case   Potential benefits of injectable opioids use as well as potential risks (i.e. addiction risk, apnea etc) and complications (i.e. Somnolence, constipation and others) were explained to the patient and were acknowledged as well as risks of Depo-medrol shot.

## 2011-02-14 NOTE — Patient Instructions (Signed)
Go to ER if worse 

## 2011-02-19 NOTE — Assessment & Plan Note (Signed)
See above

## 2011-02-19 NOTE — Assessment & Plan Note (Signed)
See Meds Prometh/Dem inj given To ER if worse

## 2011-02-22 ENCOUNTER — Other Ambulatory Visit: Payer: Self-pay | Admitting: *Deleted

## 2011-02-22 MED ORDER — ESOMEPRAZOLE MAGNESIUM 40 MG PO CPDR: 40.0000 mg | DELAYED_RELEASE_CAPSULE | Freq: Every day | ORAL | Status: DC

## 2011-03-24 ENCOUNTER — Other Ambulatory Visit (INDEPENDENT_AMBULATORY_CARE_PROVIDER_SITE_OTHER): Payer: 59

## 2011-03-24 ENCOUNTER — Encounter: Payer: Self-pay | Admitting: Internal Medicine

## 2011-03-24 ENCOUNTER — Ambulatory Visit (INDEPENDENT_AMBULATORY_CARE_PROVIDER_SITE_OTHER): Payer: 59 | Admitting: Internal Medicine

## 2011-03-24 VITALS — BP 100/50 | HR 80 | Temp 98.4°F | Resp 16 | Wt 181.0 lb

## 2011-03-24 DIAGNOSIS — E039 Hypothyroidism, unspecified: Secondary | ICD-10-CM

## 2011-03-24 DIAGNOSIS — M545 Low back pain, unspecified: Secondary | ICD-10-CM

## 2011-03-24 DIAGNOSIS — R269 Unspecified abnormalities of gait and mobility: Secondary | ICD-10-CM

## 2011-03-24 DIAGNOSIS — R5383 Other fatigue: Secondary | ICD-10-CM

## 2011-03-24 DIAGNOSIS — K219 Gastro-esophageal reflux disease without esophagitis: Secondary | ICD-10-CM

## 2011-03-24 DIAGNOSIS — E559 Vitamin D deficiency, unspecified: Secondary | ICD-10-CM

## 2011-03-24 DIAGNOSIS — R5381 Other malaise: Secondary | ICD-10-CM

## 2011-03-24 DIAGNOSIS — E2749 Other adrenocortical insufficiency: Secondary | ICD-10-CM

## 2011-03-24 DIAGNOSIS — F411 Generalized anxiety disorder: Secondary | ICD-10-CM

## 2011-03-24 DIAGNOSIS — E785 Hyperlipidemia, unspecified: Secondary | ICD-10-CM

## 2011-03-24 LAB — COMPREHENSIVE METABOLIC PANEL
ALT: 12 U/L (ref 0–35)
AST: 14 U/L (ref 0–37)
Albumin: 3.4 g/dL — ABNORMAL LOW (ref 3.5–5.2)
Alkaline Phosphatase: 110 U/L (ref 39–117)
BUN: 10 mg/dL (ref 6–23)
CO2: 30 mEq/L (ref 19–32)
Calcium: 9.5 mg/dL (ref 8.4–10.5)
Chloride: 106 mEq/L (ref 96–112)
Creatinine, Ser: 1 mg/dL (ref 0.4–1.2)
GFR: 58 mL/min — ABNORMAL LOW (ref >60.00)
Glucose, Bld: 80 mg/dL (ref 70–99)
Potassium: 4.6 mEq/L (ref 3.5–5.1)
Sodium: 143 mEq/L (ref 135–145)
Total Bilirubin: 0.2 mg/dL — ABNORMAL LOW (ref 0.3–1.2)
Total Protein: 6.7 g/dL (ref 6.0–8.3)

## 2011-03-24 LAB — TSH: TSH: 0.67 u[IU]/mL (ref 0.35–5.50)

## 2011-03-24 MED ORDER — HYDROCODONE-ACETAMINOPHEN 10-325 MG PO TABS: 1.0000 | ORAL_TABLET | Freq: Four times a day (QID) | ORAL | Status: DC | PRN

## 2011-03-24 MED ORDER — ALPRAZOLAM 0.25 MG PO TABS: 0.2500 mg | ORAL_TABLET | Freq: Three times a day (TID) | ORAL | Status: DC | PRN

## 2011-03-24 MED ORDER — GABAPENTIN 100 MG PO CAPS: 100.0000 mg | ORAL_CAPSULE | Freq: Three times a day (TID) | ORAL | Status: DC

## 2011-03-24 NOTE — Assessment & Plan Note (Signed)
Continue with current prescription therapy as reflected on the Med list.  

## 2011-03-24 NOTE — Assessment & Plan Note (Signed)
CFS post-cancer treatment 

## 2011-03-24 NOTE — Assessment & Plan Note (Signed)
In a w/c - chronic

## 2011-03-24 NOTE — Progress Notes (Signed)
  Subjective:    Patient ID: Kaylee Hansen, female    DOB: 1955-07-22, 55 y.o.   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   Review of Systems  Constitutional: Negative for chills, activity change, appetite change, fatigue and unexpected weight change.  HENT: Negative for congestion, mouth sores and sinus pressure.   Eyes: Negative for visual disturbance.  Respiratory: Negative for cough and chest tightness.   Gastrointestinal: Negative for nausea and abdominal pain.  Genitourinary: Negative for frequency, difficulty urinating and vaginal pain.  Musculoskeletal: Positive for back pain. Negative for gait problem.  Skin: Negative for pallor and rash.  Neurological: Negative for dizziness, tremors, weakness, numbness and headaches.  Psychiatric/Behavioral: Negative for confusion and sleep disturbance. The patient is nervous/anxious.        Objective:   Physical Exam  Constitutional: She appears well-developed. No distress.       obese  HENT:  Head: Normocephalic.  Right Ear: External ear normal.  Left Ear: External ear normal.  Nose: Nose normal.  Mouth/Throat: Oropharynx is clear and moist.  Eyes: Conjunctivae are normal. Pupils are equal, round, and reactive to light. Right eye exhibits no discharge. Left eye exhibits no discharge.  Neck: Normal range of motion. Neck supple. No JVD present. No tracheal deviation present. No thyromegaly present.  Cardiovascular: Normal rate, regular rhythm and normal heart sounds.   Pulmonary/Chest: No stridor. No respiratory distress. She has no wheezes.  Abdominal: Soft. Bowel sounds are normal. She exhibits no distension and no mass. There is no tenderness. There is no rebound and no guarding.  Musculoskeletal: She exhibits tenderness (ls spine). She exhibits no edema.  Lymphadenopathy:   She has no cervical adenopathy.  Neurological: She displays normal reflexes. No cranial nerve deficit. She exhibits normal muscle tone. Coordination abnormal.       w/c  Skin: No rash noted. No erythema.  Psychiatric: Thought content normal.       Subdued, sleepy          Assessment & Plan:

## 2011-03-24 NOTE — Assessment & Plan Note (Signed)
Better diet  

## 2011-03-25 LAB — PHENYTOIN LEVEL, TOTAL: Phenytoin Lvl: 17.4 ug/mL (ref 10.0–20.0)

## 2011-03-28 LAB — COMPREHENSIVE METABOLIC PANEL
ALT: 14
AST: 14
Albumin: 3.4 — ABNORMAL LOW
Alkaline Phosphatase: 74
BUN: 16
CO2: 27
Calcium: 9.5
Chloride: 107
Creatinine, Ser: 0.96
GFR calc Af Amer: >60
GFR calc non Af Amer: >60
Glucose, Bld: 105 — ABNORMAL HIGH
Potassium: 4.4
Sodium: 142
Total Bilirubin: 0.5
Total Protein: 6.1

## 2011-03-28 LAB — CBC
HCT: 36.7
Hemoglobin: 12.3
MCHC: 33.5
MCV: 100
Platelets: 225
RBC: 3.67 — ABNORMAL LOW
RDW: 13.9
WBC: 6.3

## 2011-03-28 LAB — DIFFERENTIAL
Basophils Absolute: 0.1
Basophils Relative: 1
Eosinophils Absolute: 0.2
Eosinophils Relative: 3
Lymphocytes Relative: 27
Lymphs Abs: 1.7
Monocytes Absolute: 0.5
Monocytes Relative: 7
Neutro Abs: 3.9
Neutrophils Relative %: 61

## 2011-03-28 LAB — LIPASE, BLOOD: Lipase: 39

## 2011-06-28 ENCOUNTER — Ambulatory Visit (INDEPENDENT_AMBULATORY_CARE_PROVIDER_SITE_OTHER): Payer: Medicare Other | Admitting: Internal Medicine

## 2011-06-28 ENCOUNTER — Encounter: Payer: Self-pay | Admitting: Internal Medicine

## 2011-06-28 DIAGNOSIS — K219 Gastro-esophageal reflux disease without esophagitis: Secondary | ICD-10-CM

## 2011-06-28 DIAGNOSIS — E785 Hyperlipidemia, unspecified: Secondary | ICD-10-CM

## 2011-06-28 DIAGNOSIS — F329 Major depressive disorder, single episode, unspecified: Secondary | ICD-10-CM

## 2011-06-28 DIAGNOSIS — R569 Unspecified convulsions: Secondary | ICD-10-CM

## 2011-06-28 DIAGNOSIS — F411 Generalized anxiety disorder: Secondary | ICD-10-CM

## 2011-06-28 DIAGNOSIS — E039 Hypothyroidism, unspecified: Secondary | ICD-10-CM

## 2011-06-28 MED ORDER — PAROXETINE HCL 40 MG PO TABS: 40.0000 mg | ORAL_TABLET | ORAL | Status: DC

## 2011-06-28 MED ORDER — PANTOPRAZOLE SODIUM 40 MG PO TBEC: 40.0000 mg | DELAYED_RELEASE_TABLET | Freq: Every day | ORAL | Status: DC

## 2011-06-28 MED ORDER — PHENYTOIN SODIUM EXTENDED 100 MG PO CAPS: ORAL_CAPSULE | ORAL | Status: DC

## 2011-06-28 MED ORDER — LEVOTHYROXINE SODIUM 150 MCG PO TABS: 150.0000 ug | ORAL_TABLET | Freq: Every day | ORAL | Status: DC

## 2011-06-28 NOTE — Assessment & Plan Note (Signed)
Continue with current prescription therapy as reflected on the Med list.  

## 2011-06-28 NOTE — Progress Notes (Signed)
  Subjective:    Patient ID: Kaylee Hansen, female    DOB: 04-26-1956, 56 y.o.   MRN: [id number]  HPI  The patient presents for a follow-up of  chronic hypertension, chronic dyslipidemia, depression, OA controlled with medicines    Review of Systems  Constitutional: Negative for chills, activity change, appetite change, fatigue and unexpected weight change.  HENT: Negative for congestion, mouth sores and sinus pressure.   Eyes: Negative for visual disturbance.  Respiratory: Negative for cough, chest tightness and shortness of breath.   Gastrointestinal: Negative for nausea and abdominal pain.  Genitourinary: Negative for frequency, difficulty urinating and vaginal pain.  Musculoskeletal: Positive for back pain and gait problem (w/c).  Skin: Negative for pallor and rash.  Neurological: Negative for dizziness, tremors, weakness, numbness and headaches.  Psychiatric/Behavioral: Negative for suicidal ideas, confusion and sleep disturbance. The patient is not nervous/anxious.    Wt Readings from Last 3 Encounters:  06/28/11 182 lb (82.555 kg)  03/24/11 181 lb (82.101 kg)  02/14/11 178 lb (80.74 kg)       Objective:   Physical Exam  Constitutional: She appears well-developed. No distress.       obese  HENT:  Head: Normocephalic.  Right Ear: External ear normal.  Left Ear: External ear normal.  Nose: Nose normal.  Mouth/Throat: Oropharynx is clear and moist.  Eyes: Conjunctivae are normal. Pupils are equal, round, and reactive to light. Right eye exhibits no discharge. Left eye exhibits no discharge.  Neck: Normal range of motion. Neck supple. No JVD present. No tracheal deviation present. No thyromegaly present.  Cardiovascular: Normal rate, regular rhythm and normal heart sounds.   Pulmonary/Chest: No stridor. No respiratory distress. She has no wheezes.  Abdominal: Soft. Bowel sounds are normal. She exhibits no distension and no mass. There is no tenderness. There is no rebound  and no guarding.  Musculoskeletal: She exhibits no edema and no tenderness.  Lymphadenopathy:    She has no cervical adenopathy.  Neurological: She displays normal reflexes. No cranial nerve deficit. She exhibits normal muscle tone. Coordination normal.  Skin: No rash noted. No erythema.  Psychiatric: She has a normal mood and affect. Her behavior is normal. Judgment and thought content normal.          Assessment & Plan:

## 2011-06-28 NOTE — Assessment & Plan Note (Signed)
Due to h/o brain met No recurrence

## 2011-06-28 NOTE — Assessment & Plan Note (Signed)
  On diet  

## 2011-07-19 ENCOUNTER — Telehealth: Payer: Self-pay

## 2011-07-19 NOTE — Telephone Encounter (Signed)
Ok Thx 

## 2011-07-19 NOTE — Telephone Encounter (Signed)
Patient has new insurance and they need new RX for her Oxygen. Her previous MD that prescribed the oxygen in 1998 is no longer in [place]. The patient has no pulmonary MD. Please advise as the patient stated she needs the oxygen at night to sleep, but does not use often during the day.

## 2011-07-20 NOTE — Telephone Encounter (Signed)
What is needed?

## 2011-07-25 ENCOUNTER — Telehealth: Payer: Self-pay | Admitting: *Deleted

## 2011-07-25 DIAGNOSIS — J449 Chronic obstructive pulmonary disease, unspecified: Secondary | ICD-10-CM

## 2011-07-25 NOTE — Telephone Encounter (Signed)
Ok Thx 

## 2011-07-25 NOTE — Telephone Encounter (Signed)
Agree, Thanks! 

## 2011-07-25 NOTE — Telephone Encounter (Signed)
Patient called and left voice message stating she needs a new Rx for oxygen. She stated the previous doctor that has written the Rx is no longer in town. If approved she is requesting the oxygen through [facility] service.

## 2011-07-25 NOTE — Telephone Encounter (Signed)
Left mess for patient to call back.  

## 2011-07-26 NOTE — Telephone Encounter (Signed)
referral signed/sent to [facility]

## 2011-07-26 NOTE — Telephone Encounter (Signed)
Dx COPD Thx

## 2011-07-27 ENCOUNTER — Telehealth: Payer: Self-pay | Admitting: *Deleted

## 2011-07-27 NOTE — Telephone Encounter (Signed)
Whatever needs to be done in order to recert her O2. I need to be told what needs to be done Thx

## 2011-07-27 NOTE — Telephone Encounter (Signed)
Left detailed mess informing pt to call back to schedule OV.

## 2011-07-27 NOTE — Telephone Encounter (Signed)
Per Christoper Allegra- we need to fax over recent OV note with O2 sat and mention of need/dx related to need of O2. Next OV is 4/13. Do you want her scheduled sooner? Please advise.

## 2011-07-31 ENCOUNTER — Encounter: Payer: Self-pay | Admitting: Internal Medicine

## 2011-07-31 ENCOUNTER — Ambulatory Visit (INDEPENDENT_AMBULATORY_CARE_PROVIDER_SITE_OTHER): Payer: Medicare Other | Admitting: Internal Medicine

## 2011-07-31 VITALS — BP 120/52 | HR 104 | Temp 97.9°F | Resp 16 | Wt 183.0 lb

## 2011-07-31 DIAGNOSIS — L304 Erythema intertrigo: Secondary | ICD-10-CM

## 2011-07-31 DIAGNOSIS — L538 Other specified erythematous conditions: Secondary | ICD-10-CM

## 2011-07-31 DIAGNOSIS — R0902 Hypoxemia: Secondary | ICD-10-CM

## 2011-07-31 DIAGNOSIS — G4734 Idiopathic sleep related nonobstructive alveolar hypoventilation: Secondary | ICD-10-CM | POA: Insufficient documentation

## 2011-07-31 MED ORDER — KETOCONAZOLE 2 % EX CREA: TOPICAL_CREAM | Freq: Every day | CUTANEOUS | Status: DC

## 2011-07-31 NOTE — Progress Notes (Signed)
  Subjective:    Patient ID: Kaylee Hansen, female    DOB: 1956-05-27, 55 y.o.   MRN: [id number]  Rash Associated symptoms include fatigue and shortness of breath. Pertinent negatives include no vomiting.    C/o rash on abd x 2-3 wks - itchy F/u SOB at night with a h/o desaturation  Wt Readings from Last 3 Encounters:  07/31/11 183 lb (83.008 kg)  06/28/11 182 lb (82.555 kg)  03/24/11 181 lb (82.101 kg)     Review of Systems  Constitutional: Positive for fatigue.  Respiratory: Positive for apnea (at night) and shortness of breath.   Gastrointestinal: Negative for vomiting.  Musculoskeletal: Positive for back pain and gait problem.  Skin: Positive for rash.  Psychiatric/Behavioral: Negative for suicidal ideas. The patient is nervous/anxious.    Wt Readings from Last 3 Encounters:  07/31/11 183 lb (83.008 kg)  06/28/11 182 lb (82.555 kg)  03/24/11 181 lb (82.101 kg)       Objective:   Physical Exam  Constitutional: She appears well-developed. No distress.       obese  HENT:  Head: Normocephalic.  Right Ear: External ear normal.  Left Ear: External ear normal.  Nose: Nose normal.  Mouth/Throat: Oropharynx is clear and moist.  Eyes: Conjunctivae are normal. Pupils are equal, round, and reactive to light. Right eye exhibits no discharge. Left eye exhibits no discharge.  Neck: Normal range of motion. Neck supple. No JVD present. No tracheal deviation present. No thyromegaly present.  Cardiovascular: Normal rate, regular rhythm and normal heart sounds.   Pulmonary/Chest: No stridor. No respiratory distress. She has no wheezes. She exhibits no tenderness.  Abdominal: Soft. Bowel sounds are normal. She exhibits no distension and no mass. There is no tenderness. There is no rebound and no guarding.  Musculoskeletal: She exhibits no edema and no tenderness.  Lymphadenopathy:    She has no cervical adenopathy.  Neurological: She displays normal reflexes. No cranial nerve  deficit. She exhibits normal muscle tone. Coordination normal.  Skin: Rash (intertrigo - suprapubic) noted. No erythema.  Psychiatric: She has a normal mood and affect. Her behavior is normal. Judgment and thought content normal.          Assessment & Plan:

## 2011-07-31 NOTE — Assessment & Plan Note (Signed)
Night time oxymetry

## 2011-08-08 ENCOUNTER — Encounter: Payer: Self-pay | Admitting: Internal Medicine

## 2011-08-08 NOTE — Assessment & Plan Note (Signed)
given cream

## 2011-08-18 ENCOUNTER — Other Ambulatory Visit: Payer: Self-pay | Admitting: *Deleted

## 2011-08-18 MED ORDER — LEVOTHYROXINE SODIUM 150 MCG PO TABS: 150.0000 ug | ORAL_TABLET | Freq: Every day | ORAL | Status: DC

## 2011-08-18 MED ORDER — CALCITRIOL 0.25 MCG PO CAPS: 0.2500 ug | ORAL_CAPSULE | Freq: Every day | ORAL | Status: DC

## 2011-08-18 NOTE — Progress Notes (Signed)
Addended by: Merrilyn Puma on: 08/18/2011 10:23 AM   Modules accepted: Orders

## 2011-09-15 ENCOUNTER — Encounter: Payer: Self-pay | Admitting: Internal Medicine

## 2011-09-27 ENCOUNTER — Ambulatory Visit (INDEPENDENT_AMBULATORY_CARE_PROVIDER_SITE_OTHER): Payer: Medicare Other | Admitting: Internal Medicine

## 2011-09-27 ENCOUNTER — Other Ambulatory Visit (INDEPENDENT_AMBULATORY_CARE_PROVIDER_SITE_OTHER): Payer: Medicare Other

## 2011-09-27 ENCOUNTER — Encounter: Payer: Self-pay | Admitting: Internal Medicine

## 2011-09-27 VITALS — BP 114/62 | HR 89 | Temp 97.4°F | Ht 65.0 in | Wt 181.6 lb

## 2011-09-27 DIAGNOSIS — R404 Transient alteration of awareness: Secondary | ICD-10-CM

## 2011-09-27 DIAGNOSIS — Z720 Tobacco use: Secondary | ICD-10-CM

## 2011-09-27 DIAGNOSIS — M545 Low back pain, unspecified: Secondary | ICD-10-CM

## 2011-09-27 DIAGNOSIS — J449 Chronic obstructive pulmonary disease, unspecified: Secondary | ICD-10-CM

## 2011-09-27 DIAGNOSIS — E785 Hyperlipidemia, unspecified: Secondary | ICD-10-CM

## 2011-09-27 DIAGNOSIS — N12 Tubulo-interstitial nephritis, not specified as acute or chronic: Secondary | ICD-10-CM

## 2011-09-27 DIAGNOSIS — F17200 Nicotine dependence, unspecified, uncomplicated: Secondary | ICD-10-CM

## 2011-09-27 DIAGNOSIS — E559 Vitamin D deficiency, unspecified: Secondary | ICD-10-CM

## 2011-09-27 LAB — URINALYSIS
Bilirubin Urine: NEGATIVE
Hgb urine dipstick: NEGATIVE
Ketones, ur: NEGATIVE
Leukocytes, UA: NEGATIVE
Nitrite: NEGATIVE
Specific Gravity, Urine: 1.02 (ref 1.000–1.030)
Total Protein, Urine: NEGATIVE
Urine Glucose: NEGATIVE
Urobilinogen, UA: 1 (ref 0.0–1.0)
pH: 6 (ref 5.0–8.0)

## 2011-09-27 LAB — TSH: TSH: 1.39 u[IU]/mL (ref 0.35–5.50)

## 2011-09-27 LAB — BASIC METABOLIC PANEL
BUN: 11 mg/dL (ref 6–23)
CO2: 30 mEq/L (ref 19–32)
Calcium: 9.3 mg/dL (ref 8.4–10.5)
Chloride: 102 mEq/L (ref 96–112)
Creatinine, Ser: 1 mg/dL (ref 0.4–1.2)
GFR: 63.49 mL/min (ref >60.00)
Glucose, Bld: 87 mg/dL (ref 70–99)
Potassium: 4.2 mEq/L (ref 3.5–5.1)
Sodium: 140 mEq/L (ref 135–145)

## 2011-09-27 MED ORDER — ALPRAZOLAM 0.25 MG PO TABS: 0.2500 mg | ORAL_TABLET | Freq: Three times a day (TID) | ORAL | Status: DC | PRN

## 2011-09-27 MED ORDER — HYDROCODONE-ACETAMINOPHEN 10-325 MG PO TABS: 1.0000 | ORAL_TABLET | Freq: Four times a day (QID) | ORAL | Status: DC | PRN

## 2011-09-27 MED ORDER — CIPROFLOXACIN HCL 250 MG PO TABS: 250.0000 mg | ORAL_TABLET | Freq: Two times a day (BID) | ORAL | Status: AC

## 2011-09-27 MED ORDER — TRAZODONE HCL 100 MG PO TABS: 100.0000 mg | ORAL_TABLET | Freq: Every day | ORAL | Status: DC

## 2011-09-27 MED ORDER — GABAPENTIN 100 MG PO CAPS: 100.0000 mg | ORAL_CAPSULE | Freq: Three times a day (TID) | ORAL | Status: DC

## 2011-09-27 NOTE — Assessment & Plan Note (Signed)
Continue with current prescription therapy as reflected on the Med list.  

## 2011-09-27 NOTE — Assessment & Plan Note (Signed)
Discussed.

## 2011-09-27 NOTE — Progress Notes (Signed)
Patient ID: Kaylee Hansen, female   DOB: 1955/10/02, 56 y.o.   MRN: [id number]  Subjective:    Patient ID: Kaylee Hansen, female    DOB: 02/12/1956, 56 y.o.   MRN: [id number]  HPI C/o urinary burning, chills F/u on rash on abd - itchy - resolved F/u SOB at night with a h/o desaturation; anxiety, siezures  BP Readings from Last 3 Encounters:  09/27/11 114/62  07/31/11 120/52  06/28/11 110/52      Review of Systems  HENT: Positive for sinus pressure. Negative for nosebleeds.   Respiratory: Positive for apnea (at night). Negative for cough and shortness of breath.   Gastrointestinal: Positive for constipation. Negative for anal bleeding.  Musculoskeletal: Positive for back pain and gait problem.  Skin: Negative for rash and wound.  Psychiatric/Behavioral: Positive for sleep disturbance and decreased concentration. Negative for suicidal ideas, behavioral problems, self-injury and agitation. The patient is nervous/anxious.    Wt Readings from Last 3 Encounters:  09/27/11 181 lb 9.6 oz (82.373 kg)  07/31/11 183 lb (83.008 kg)  06/28/11 182 lb (82.555 kg)       Objective:   Physical Exam  Constitutional: She appears well-developed. No distress.       obese  HENT:  Head: Normocephalic.  Right Ear: External ear normal.  Left Ear: External ear normal.  Nose: Nose normal.  Mouth/Throat: Oropharynx is clear and moist.  Eyes: Conjunctivae are normal. Pupils are equal, round, and reactive to light. Right eye exhibits no discharge. Left eye exhibits no discharge.  Neck: Normal range of motion. Neck supple. No JVD present. No tracheal deviation present. No thyromegaly present.  Cardiovascular: Normal rate, regular rhythm and normal heart sounds.   Pulmonary/Chest: No stridor. No respiratory distress. She has no wheezes. She exhibits no tenderness.  Abdominal: Soft. Bowel sounds are normal. She exhibits no distension and no mass. There is no tenderness. There is no rebound and no  guarding.  Musculoskeletal: She exhibits no edema and no tenderness.  Lymphadenopathy:    She has no cervical adenopathy.  Neurological: She displays normal reflexes. No cranial nerve deficit. She exhibits normal muscle tone. Coordination normal.  Skin: Rash (intertrigo - suprapubic resolving) noted. No erythema.  Psychiatric: She has a normal mood and affect. Her behavior is normal. Judgment and thought content normal.      Lab Results  Component Value Date   WBC 7.8 07/08/2010   HGB 12.0 07/08/2010   HCT 35.4* 07/08/2010   PLT 217.0 07/08/2010   GLUCOSE 80 03/24/2011   CHOL 339* 08/12/2009   TRIG 298.0* 08/12/2009   HDL 43.10 08/12/2009   LDLDIRECT 232.7 08/12/2009   LDLCALC  Value: 240        Total Cholesterol/HDL:CHD Risk Coronary Heart Disease Risk Table                     Men   Women  1/2 Average Risk   3.4   3.3  Average Risk       5.0   4.4  2 X Average Risk   9.6   7.1  3 X Average Risk  23.4   11.0        Use the calculated Patient Ratio above and the CHD Risk Table to determine the patient's CHD Risk.        ATP III CLASSIFICATION (LDL):  <100     mg/dL   Optimal  865-784  mg/dL   Near or Above  Optimal  130-159  mg/dL   Borderline  161-096  mg/dL   High  >045     mg/dL   Very High* 40/98/1191   ALT 12 03/24/2011   AST 14 03/24/2011   NA 143 03/24/2011   K 4.6 03/24/2011   CL 106 03/24/2011   CREATININE 1.0 03/24/2011   BUN 10 03/24/2011   CO2 30 03/24/2011   TSH 0.67 03/24/2011   INR 1.0 09/16/2008   HGBA1C  Value: 5.0 (NOTE)   The ADA recommends the following therapeutic goal for glycemic   control related to Hgb A1C measurement:   Goal of Therapy:   < 7.0% Hgb A1C   Reference: American Diabetes Association: Clinical Practice   Recommendations 2008, Diabetes Care,  2008, 31:(Suppl 1). 09/17/2008       Assessment & Plan:

## 2011-09-27 NOTE — Assessment & Plan Note (Signed)
Better lately 

## 2011-09-27 NOTE — Assessment & Plan Note (Signed)
UA Start abx

## 2011-09-27 NOTE — Assessment & Plan Note (Signed)
  On diet  

## 2011-09-28 LAB — PHENYTOIN LEVEL, TOTAL: Phenytoin Lvl: 16.2 ug/mL (ref 10.0–20.0)

## 2011-10-03 ENCOUNTER — Encounter: Payer: Self-pay | Admitting: Internal Medicine

## 2011-10-08 ENCOUNTER — Other Ambulatory Visit: Payer: Self-pay | Admitting: Internal Medicine

## 2011-10-09 NOTE — Telephone Encounter (Signed)
Gabapentin refill denied as printed Rx was given to pt on 09/27/11. Pharmacy verified receipt and will discard current request.

## 2011-10-12 ENCOUNTER — Telehealth: Payer: Self-pay | Admitting: *Deleted

## 2011-10-12 ENCOUNTER — Other Ambulatory Visit: Payer: Self-pay | Admitting: Internal Medicine

## 2011-10-12 MED ORDER — TRAZODONE HCL 100 MG PO TABS: 100.0000 mg | ORAL_TABLET | Freq: Every day | ORAL | Status: DC

## 2011-10-12 NOTE — Telephone Encounter (Signed)
Ok to fill for 90d, no refills pending PCP reiew

## 2011-10-12 NOTE — Telephone Encounter (Signed)
Requested Medications     traZODone (DESYREL) 100 MG tablet [Pharmacy Med Name: TRAZODONE 100 MG TABLET]   The source prescription has been discontinued.   TAKE 1-2 TABLETS BY MOUTH AT BEDTIME   Disp: 30 tablet R: 11 Start: 10/12/2011  Class: Normal   Requested on: 11/18/2010   Originally ordered on: 11/17/2010  Last refill: 07/16/2011

## 2011-10-13 MED ORDER — TRAZODONE HCL 100 MG PO TABS: 100.0000 mg | ORAL_TABLET | Freq: Every day | ORAL | Status: DC

## 2011-10-13 NOTE — Telephone Encounter (Signed)
Done for # 30 with 2 Rf since request was for # 30.

## 2011-11-03 ENCOUNTER — Other Ambulatory Visit: Payer: Self-pay

## 2011-11-03 MED ORDER — ALBUTEROL SULFATE HFA 108 (90 BASE) MCG/ACT IN AERS: 2.0000 | INHALATION_SPRAY | Freq: Four times a day (QID) | RESPIRATORY_TRACT | Status: DC | PRN

## 2011-11-10 ENCOUNTER — Other Ambulatory Visit: Payer: Self-pay | Admitting: Internal Medicine

## 2011-12-04 ENCOUNTER — Other Ambulatory Visit: Payer: Self-pay | Admitting: Internal Medicine

## 2011-12-26 ENCOUNTER — Ambulatory Visit (INDEPENDENT_AMBULATORY_CARE_PROVIDER_SITE_OTHER): Payer: Medicare Other | Admitting: Internal Medicine

## 2011-12-26 ENCOUNTER — Encounter: Payer: Self-pay | Admitting: Internal Medicine

## 2011-12-26 VITALS — BP 92/50 | HR 88 | Temp 98.3°F | Resp 16 | Wt 176.0 lb

## 2011-12-26 DIAGNOSIS — Z720 Tobacco use: Secondary | ICD-10-CM

## 2011-12-26 DIAGNOSIS — F17200 Nicotine dependence, unspecified, uncomplicated: Secondary | ICD-10-CM

## 2011-12-26 DIAGNOSIS — R5383 Other fatigue: Secondary | ICD-10-CM

## 2011-12-26 DIAGNOSIS — J449 Chronic obstructive pulmonary disease, unspecified: Secondary | ICD-10-CM

## 2011-12-26 DIAGNOSIS — R269 Unspecified abnormalities of gait and mobility: Secondary | ICD-10-CM

## 2011-12-26 DIAGNOSIS — M545 Low back pain, unspecified: Secondary | ICD-10-CM

## 2011-12-26 DIAGNOSIS — F329 Major depressive disorder, single episode, unspecified: Secondary | ICD-10-CM

## 2011-12-26 DIAGNOSIS — G4734 Idiopathic sleep related nonobstructive alveolar hypoventilation: Secondary | ICD-10-CM

## 2011-12-26 DIAGNOSIS — R5381 Other malaise: Secondary | ICD-10-CM

## 2011-12-26 DIAGNOSIS — R0902 Hypoxemia: Secondary | ICD-10-CM

## 2011-12-26 MED ORDER — PAROXETINE HCL 40 MG PO TABS: 40.0000 mg | ORAL_TABLET | Freq: Every day | ORAL | Status: DC

## 2011-12-26 MED ORDER — PHENYTOIN SODIUM EXTENDED 100 MG PO CAPS: ORAL_CAPSULE | ORAL | Status: DC

## 2011-12-26 MED ORDER — TRAZODONE HCL 100 MG PO TABS: 100.0000 mg | ORAL_TABLET | Freq: Every day | ORAL | Status: DC

## 2011-12-26 NOTE — Assessment & Plan Note (Signed)
Long-term; on night time O2 x years

## 2011-12-26 NOTE — Assessment & Plan Note (Signed)
Continue with current prescription therapy as reflected on the Med list. Looks better

## 2011-12-26 NOTE — Progress Notes (Signed)
Subjective:    Patient ID: Kaylee Hansen, female    DOB: 02-Apr-1956, 56 y.o.   MRN: [id number]  HPI  F/u SOB at night with a h/o desaturation; COPD, LBP, anxiety, siezures  BP Readings from Last 3 Encounters:  12/26/11 92/50  09/27/11 114/62  07/31/11 120/52      Review of Systems  Constitutional: Negative for chills, activity change, appetite change, fatigue and unexpected weight change.  HENT: Negative for nosebleeds, congestion, mouth sores and sinus pressure.   Eyes: Negative for visual disturbance.  Respiratory: Positive for apnea (at night). Negative for cough, chest tightness and shortness of breath.   Gastrointestinal: Positive for constipation. Negative for nausea, abdominal pain and anal bleeding.  Genitourinary: Negative for frequency, difficulty urinating and vaginal pain.  Musculoskeletal: Positive for back pain and gait problem (better).  Skin: Negative for pallor, rash and wound.  Neurological: Negative for dizziness, tremors, weakness, numbness and headaches.  Psychiatric/Behavioral: Positive for disturbed wake/sleep cycle and decreased concentration. Negative for suicidal ideas, behavioral problems, confusion, self-injury and agitation. The patient is nervous/anxious.    Wt Readings from Last 3 Encounters:  12/26/11 176 lb (79.833 kg)  09/27/11 181 lb 9.6 oz (82.373 kg)  07/31/11 183 lb (83.008 kg)       Objective:   Physical Exam  Constitutional: She appears well-developed. No distress.       obese  HENT:  Head: Normocephalic.  Right Ear: External ear normal.  Left Ear: External ear normal.  Nose: Nose normal.  Mouth/Throat: Oropharynx is clear and moist.  Eyes: Conjunctivae are normal. Pupils are equal, round, and reactive to light. Right eye exhibits no discharge. Left eye exhibits no discharge.  Neck: Normal range of motion. Neck supple. No JVD present. No tracheal deviation present. No thyromegaly present.  Cardiovascular: Normal rate, regular  rhythm and normal heart sounds.   Pulmonary/Chest: No stridor. No respiratory distress. She has no wheezes. She exhibits no tenderness.  Abdominal: Soft. Bowel sounds are normal. She exhibits no distension and no mass. There is no tenderness. There is no rebound and no guarding.  Musculoskeletal: She exhibits tenderness. She exhibits no edema.  Lymphadenopathy:    She has no cervical adenopathy.  Neurological: She displays normal reflexes. No cranial nerve deficit. She exhibits normal muscle tone. Coordination abnormal.       Better - out of a w/c now  Skin: Rash (intertrigo - suprapubic resolving) noted. No erythema.  Psychiatric: She has a normal mood and affect. Her behavior is normal. Judgment and thought content normal.      Lab Results  Component Value Date   WBC 7.8 07/08/2010   HGB 12.0 07/08/2010   HCT 35.4* 07/08/2010   PLT 217.0 07/08/2010   GLUCOSE 87 09/27/2011   CHOL 339* 08/12/2009   TRIG 298.0* 08/12/2009   HDL 43.10 08/12/2009   LDLDIRECT 232.7 08/12/2009   LDLCALC  Value: 240        Total Cholesterol/HDL:CHD Risk Coronary Heart Disease Risk Table                     Men   Women  1/2 Average Risk   3.4   3.3  Average Risk       5.0   4.4  2 X Average Risk   9.6   7.1  3 X Average Risk  23.4   11.0        Use the calculated Patient Ratio above and the CHD Risk Table to  determine the patient's CHD Risk.        ATP III CLASSIFICATION (LDL):  <100     mg/dL   Optimal  409-811  mg/dL   Near or Above                    Optimal  130-159  mg/dL   Borderline  914-782  mg/dL   High  >956     mg/dL   Very High* 21/30/8657   ALT 12 03/24/2011   AST 14 03/24/2011   NA 140 09/27/2011   K 4.2 09/27/2011   CL 102 09/27/2011   CREATININE 1.0 09/27/2011   BUN 11 09/27/2011   CO2 30 09/27/2011   TSH 1.39 09/27/2011   INR 1.0 09/16/2008   HGBA1C  Value: 5.0 (NOTE)   The ADA recommends the following therapeutic goal for glycemic   control related to Hgb A1C measurement:   Goal of Therapy:   < 7.0% Hgb A1C    Reference: American Diabetes Association: Clinical Practice   Recommendations 2008, Diabetes Care,  2008, 31:(Suppl 1). 09/17/2008       Assessment & Plan:

## 2011-12-26 NOTE — Assessment & Plan Note (Signed)
Better now 

## 2011-12-26 NOTE — Assessment & Plan Note (Signed)
Continue with current prescription therapy as reflected on the Med list.  

## 2011-12-26 NOTE — Assessment & Plan Note (Signed)
7/13 - out of a w/c more

## 2011-12-26 NOTE — Assessment & Plan Note (Signed)
Discussed.

## 2011-12-26 NOTE — Assessment & Plan Note (Signed)
In a smoker Continue with current prescription therapy as reflected on the Med list. On O2 at night

## 2012-01-18 ENCOUNTER — Ambulatory Visit (INDEPENDENT_AMBULATORY_CARE_PROVIDER_SITE_OTHER): Payer: Medicare Other | Admitting: Internal Medicine

## 2012-01-18 ENCOUNTER — Encounter: Payer: Self-pay | Admitting: Internal Medicine

## 2012-01-18 ENCOUNTER — Other Ambulatory Visit (INDEPENDENT_AMBULATORY_CARE_PROVIDER_SITE_OTHER): Payer: Medicare Other

## 2012-01-18 VITALS — BP 112/62 | HR 81 | Temp 98.5°F | Ht 65.0 in | Wt 174.1 lb

## 2012-01-18 DIAGNOSIS — R5381 Other malaise: Secondary | ICD-10-CM

## 2012-01-18 DIAGNOSIS — R5383 Other fatigue: Secondary | ICD-10-CM

## 2012-01-18 DIAGNOSIS — E039 Hypothyroidism, unspecified: Secondary | ICD-10-CM

## 2012-01-18 DIAGNOSIS — R1032 Left lower quadrant pain: Secondary | ICD-10-CM

## 2012-01-18 DIAGNOSIS — R300 Dysuria: Secondary | ICD-10-CM

## 2012-01-18 LAB — URINALYSIS, ROUTINE W REFLEX MICROSCOPIC
Bilirubin Urine: NEGATIVE
Hgb urine dipstick: NEGATIVE
Ketones, ur: NEGATIVE
Leukocytes, UA: NEGATIVE
Nitrite: NEGATIVE
Specific Gravity, Urine: 1.02 (ref 1.000–1.030)
Total Protein, Urine: NEGATIVE
Urine Glucose: NEGATIVE
Urobilinogen, UA: 0.2 (ref 0.0–1.0)
pH: 7 (ref 5.0–8.0)

## 2012-01-18 LAB — BASIC METABOLIC PANEL
BUN: 12 mg/dL (ref 6–23)
CO2: 28 mEq/L (ref 19–32)
Calcium: 9.6 mg/dL (ref 8.4–10.5)
Chloride: 100 mEq/L (ref 96–112)
Creatinine, Ser: 1.1 mg/dL (ref 0.4–1.2)
GFR: 52.02 mL/min — ABNORMAL LOW (ref >60.00)
Glucose, Bld: 89 mg/dL (ref 70–99)
Potassium: 4.7 mEq/L (ref 3.5–5.1)
Sodium: 136 mEq/L (ref 135–145)

## 2012-01-18 LAB — CBC WITH DIFFERENTIAL/PLATELET
Basophils Absolute: 0.1 10*3/uL (ref 0.0–0.1)
Basophils Relative: 1.3 % (ref 0.0–3.0)
Eosinophils Absolute: 0.3 10*3/uL (ref 0.0–0.7)
Eosinophils Relative: 3.3 % (ref 0.0–5.0)
HCT: 40.9 % (ref 36.0–46.0)
Hemoglobin: 13.8 g/dL (ref 12.0–15.0)
Lymphocytes Relative: 25 % (ref 12.0–46.0)
Lymphs Abs: 2.2 10*3/uL (ref 0.7–4.0)
MCHC: 33.7 g/dL (ref 30.0–36.0)
MCV: 96.7 fl (ref 78.0–100.0)
Monocytes Absolute: 0.5 10*3/uL (ref 0.1–1.0)
Monocytes Relative: 6 % (ref 3.0–12.0)
Neutro Abs: 5.7 10*3/uL (ref 1.4–7.7)
Neutrophils Relative %: 64.4 % (ref 43.0–77.0)
Platelets: 260 10*3/uL (ref 150.0–400.0)
RBC: 4.23 Mil/uL (ref 3.87–5.11)
RDW: 13.9 % (ref 11.5–14.6)
WBC: 8.8 10*3/uL (ref 4.5–10.5)

## 2012-01-18 LAB — HEPATIC FUNCTION PANEL
ALT: 17 U/L (ref 0–35)
AST: 15 U/L (ref 0–37)
Albumin: 3.7 g/dL (ref 3.5–5.2)
Alkaline Phosphatase: 110 U/L (ref 39–117)
Bilirubin, Direct: 0 mg/dL (ref 0.0–0.3)
Total Bilirubin: 0.5 mg/dL (ref 0.3–1.2)
Total Protein: 7.1 g/dL (ref 6.0–8.3)

## 2012-01-18 LAB — TSH: TSH: 1.44 u[IU]/mL (ref 0.35–5.50)

## 2012-01-18 MED ORDER — PROMETHAZINE HCL 25 MG PO TABS: 25.0000 mg | ORAL_TABLET | Freq: Every day | ORAL | Status: DC

## 2012-01-18 NOTE — Progress Notes (Signed)
Subjective:    Patient ID: Kaylee Hansen, female    DOB: April 02, 1956, 56 y.o.   MRN: [id number]  HPI  See CC and ROS below -  Past Medical History  Diagnosis Date  . Hx of cancer of lung 1999  . History of cholecystectomy   . Hx of appendectomy   . Hx of hysterectomy   . COPD (chronic obstructive pulmonary disease)   . Low back pain   . Osteoarthritis of knee     bilateral knee  . Seizures   . Hypothyroidism   . Anxiety   . Depression   . Stroke     CVA, hx of 97  . Hyperlipidemia   . Collagenous colitis   . Esophageal ulcer     04/2009 EGD  . Esophageal ulcer 04/2009  . Helicobacter pylori gastritis 2010    Pylera Tx  . Constipation     Chronic abdominal pain and constipation  . Colon adenomas 2011    Review of Systems  Constitutional: Positive for fatigue. Negative for fever, chills, activity change, appetite change and unexpected weight change.  HENT: Positive for sore throat. Negative for postnasal drip and sinus pressure.   Respiratory: Negative for cough, shortness of breath and wheezing.   Cardiovascular: Negative for chest pain and palpitations.  Gastrointestinal: Positive for abdominal pain and constipation. Negative for diarrhea, blood in stool and abdominal distention.  Genitourinary: Positive for dysuria and flank pain.  Musculoskeletal: Positive for myalgias.  Skin: Negative for rash and wound.  Neurological: Positive for weakness and headaches. Negative for seizures, syncope, speech difficulty and numbness.       Objective:   Physical Exam BP 112/62  Pulse 81  Temp 98.5 F (36.9 C) (Oral)  Ht 5\' 5"  (1.651 m)  Wt 174 lb 1.9 oz (78.98 kg)  BMI 28.97 kg/m2  SpO2 93% Wt Readings from Last 3 Encounters:  01/18/12 174 lb 1.9 oz (78.98 kg)  12/26/11 176 lb (79.833 kg)  09/27/11 181 lb 9.6 oz (82.373 kg)   Constitutional: She appears well-developed and well-nourished. No distress. spouse at side HENT: Head: Normocephalic and atraumatic. Ears: B  TMs ok, no erythema or effusion; Nose: Nose normal. Mouth/Throat: Oropharynx is clear and moist. No oropharyngeal exudate.  Eyes: Conjunctivae and EOM are normal. Pupils are equal, round, and reactive to light. No scleral icterus.  Neck: Normal range of motion. Neck supple. No JVD present. No thyromegaly present.  Cardiovascular: Normal rate, regular rhythm and normal heart sounds.  No murmur heard. No BLE edema. Pulmonary/Chest: Effort normal and breath sounds normal. No respiratory distress. She has no wheezes.  Abdominal: Soft. Bowel sounds are normal. She exhibits no distension. There is no tenderness. no masses Skin: Skin is warm and dry. No rash noted. No erythema.  Psychiatric: She has a normal mood and affect. Her behavior is normal. Judgment and thought content normal.   Lab Results  Component Value Date   WBC 7.8 07/08/2010   HGB 12.0 07/08/2010   HCT 35.4* 07/08/2010   PLT 217.0 07/08/2010   GLUCOSE 87 09/27/2011   CHOL 339* 08/12/2009   TRIG 298.0* 08/12/2009   HDL 43.10 08/12/2009   LDLDIRECT 232.7 08/12/2009   LDLCALC  240  05/12/2009   ALT 12 03/24/2011   AST 14 03/24/2011   NA 140 09/27/2011   K 4.2 09/27/2011   CL 102 09/27/2011   CREATININE 1.0 09/27/2011   BUN 11 09/27/2011   CO2 30 09/27/2011   TSH 1.39 09/27/2011  INR 1.0 09/16/2008   HGBA1C  Value: 5.0  09/17/2008       Assessment & Plan:   Fatigue Nonspecific LLQ pain with normal exam - hx chronic abdominal pain and constipation Hypothyroid - change from brand name to generic 10/2011 Dysuria - recent PN  Exam benign Check labs including UA given recent UTI Hold empiric antibiotics unless evidence for infection symptomatic meds - refill prometh

## 2012-01-18 NOTE — Patient Instructions (Addendum)
It was good to see you today. We have reviewed your prior records including labs and tests today Test(s) ordered today. Your results will be called to you after review (48-72hours after test completion). If any changes need to be made, you will be notified at that time. Medications reviewed, no changes at this time. Refill on medication(s) as discussed today. - Your prescription(s) have been submitted to your pharmacy. Please take as directed and contact our office if you believe you are having problem(s) with the medication(s). If you develop worsening symptoms or fever, call and we can reconsider antibiotics, but it does not appear necessary to use antibiotics at this time.

## 2012-03-27 ENCOUNTER — Encounter: Payer: Self-pay | Admitting: Internal Medicine

## 2012-03-27 ENCOUNTER — Ambulatory Visit (INDEPENDENT_AMBULATORY_CARE_PROVIDER_SITE_OTHER): Payer: Medicare Other | Admitting: Internal Medicine

## 2012-03-27 VITALS — BP 110/64 | HR 72 | Temp 98.2°F | Resp 16 | Wt 178.0 lb

## 2012-03-27 DIAGNOSIS — J4489 Other specified chronic obstructive pulmonary disease: Secondary | ICD-10-CM

## 2012-03-27 DIAGNOSIS — K219 Gastro-esophageal reflux disease without esophagitis: Secondary | ICD-10-CM

## 2012-03-27 DIAGNOSIS — Z720 Tobacco use: Secondary | ICD-10-CM

## 2012-03-27 DIAGNOSIS — R404 Transient alteration of awareness: Secondary | ICD-10-CM

## 2012-03-27 DIAGNOSIS — I369 Nonrheumatic tricuspid valve disorder, unspecified: Secondary | ICD-10-CM

## 2012-03-27 DIAGNOSIS — M545 Low back pain, unspecified: Secondary | ICD-10-CM

## 2012-03-27 DIAGNOSIS — G4734 Idiopathic sleep related nonobstructive alveolar hypoventilation: Secondary | ICD-10-CM

## 2012-03-27 DIAGNOSIS — D485 Neoplasm of uncertain behavior of skin: Secondary | ICD-10-CM

## 2012-03-27 DIAGNOSIS — J449 Chronic obstructive pulmonary disease, unspecified: Secondary | ICD-10-CM

## 2012-03-27 DIAGNOSIS — R569 Unspecified convulsions: Secondary | ICD-10-CM

## 2012-03-27 DIAGNOSIS — Z23 Encounter for immunization: Secondary | ICD-10-CM

## 2012-03-27 DIAGNOSIS — R269 Unspecified abnormalities of gait and mobility: Secondary | ICD-10-CM

## 2012-03-27 DIAGNOSIS — R0902 Hypoxemia: Secondary | ICD-10-CM

## 2012-03-27 MED ORDER — PHENYTOIN SODIUM EXTENDED 100 MG PO CAPS: ORAL_CAPSULE | ORAL | Status: DC

## 2012-03-27 MED ORDER — CALCITRIOL 0.25 MCG PO CAPS: 0.2500 ug | ORAL_CAPSULE | Freq: Every day | ORAL | Status: DC

## 2012-03-27 MED ORDER — PAROXETINE HCL 40 MG PO TABS: 40.0000 mg | ORAL_TABLET | Freq: Every day | ORAL | Status: DC

## 2012-03-27 MED ORDER — HYDROXYZINE HCL 50 MG PO TABS: 50.0000 mg | ORAL_TABLET | Freq: Every evening | ORAL | Status: DC | PRN

## 2012-03-27 MED ORDER — HYDROCODONE-ACETAMINOPHEN 10-325 MG PO TABS: 1.0000 | ORAL_TABLET | Freq: Four times a day (QID) | ORAL | Status: DC | PRN

## 2012-03-27 MED ORDER — GABAPENTIN 100 MG PO CAPS: 100.0000 mg | ORAL_CAPSULE | Freq: Three times a day (TID) | ORAL | Status: DC

## 2012-03-27 MED ORDER — ALPRAZOLAM 0.25 MG PO TABS: 0.2500 mg | ORAL_TABLET | Freq: Three times a day (TID) | ORAL | Status: DC | PRN

## 2012-03-27 NOTE — Assessment & Plan Note (Signed)
needs to d/c smoking Continue with current prescription therapy as reflected on the Med list.

## 2012-03-27 NOTE — Assessment & Plan Note (Signed)
Better  

## 2012-03-27 NOTE — Assessment & Plan Note (Signed)
Continue with current prescription therapy as reflected on the Med list.  
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
## 2012-03-27 NOTE — Assessment & Plan Note (Signed)
Discussed.

## 2012-03-27 NOTE — Assessment & Plan Note (Signed)
Skin bx advised - she declined

## 2012-03-27 NOTE — Assessment & Plan Note (Signed)
Doing pretty well now

## 2012-03-27 NOTE — Assessment & Plan Note (Signed)
On O2 

## 2012-05-08 ENCOUNTER — Other Ambulatory Visit (INDEPENDENT_AMBULATORY_CARE_PROVIDER_SITE_OTHER): Payer: Medicare Other

## 2012-05-08 ENCOUNTER — Encounter: Payer: Self-pay | Admitting: Internal Medicine

## 2012-05-08 ENCOUNTER — Ambulatory Visit (INDEPENDENT_AMBULATORY_CARE_PROVIDER_SITE_OTHER): Payer: Medicare Other | Admitting: Internal Medicine

## 2012-05-08 ENCOUNTER — Telehealth: Payer: Self-pay | Admitting: Internal Medicine

## 2012-05-08 VITALS — BP 108/60 | HR 88 | Temp 97.6°F | Wt 177.0 lb

## 2012-05-08 DIAGNOSIS — R109 Unspecified abdominal pain: Secondary | ICD-10-CM

## 2012-05-08 DIAGNOSIS — K5900 Constipation, unspecified: Secondary | ICD-10-CM

## 2012-05-08 DIAGNOSIS — M359 Systemic involvement of connective tissue, unspecified: Secondary | ICD-10-CM

## 2012-05-08 LAB — HEPATIC FUNCTION PANEL
ALT: 14 U/L (ref 0–35)
AST: 15 U/L (ref 0–37)
Albumin: 3.6 g/dL (ref 3.5–5.2)
Alkaline Phosphatase: 105 U/L (ref 39–117)
Bilirubin, Direct: 0.1 mg/dL (ref 0.0–0.3)
Total Bilirubin: 0.3 mg/dL (ref 0.3–1.2)
Total Protein: 7.1 g/dL (ref 6.0–8.3)

## 2012-05-08 LAB — CBC WITH DIFFERENTIAL/PLATELET
Basophils Absolute: 0.1 10*3/uL (ref 0.0–0.1)
Basophils Relative: 1.5 % (ref 0.0–3.0)
Eosinophils Absolute: 0.2 10*3/uL (ref 0.0–0.7)
Eosinophils Relative: 3.2 % (ref 0.0–5.0)
HCT: 38.1 % (ref 36.0–46.0)
Hemoglobin: 12.4 g/dL (ref 12.0–15.0)
Lymphocytes Relative: 25.2 % (ref 12.0–46.0)
Lymphs Abs: 2 10*3/uL (ref 0.7–4.0)
MCHC: 32.5 g/dL (ref 30.0–36.0)
MCV: 97.9 fl (ref 78.0–100.0)
Monocytes Absolute: 0.5 10*3/uL (ref 0.1–1.0)
Monocytes Relative: 6.5 % (ref 3.0–12.0)
Neutro Abs: 4.9 10*3/uL (ref 1.4–7.7)
Neutrophils Relative %: 63.6 % (ref 43.0–77.0)
Platelets: 295 10*3/uL (ref 150.0–400.0)
RBC: 3.89 Mil/uL (ref 3.87–5.11)
RDW: 14 % (ref 11.5–14.6)
WBC: 7.8 10*3/uL (ref 4.5–10.5)

## 2012-05-08 LAB — URINALYSIS
Bilirubin Urine: NEGATIVE
Ketones, ur: NEGATIVE
Leukocytes, UA: NEGATIVE
Nitrite: NEGATIVE
Specific Gravity, Urine: 1.025 (ref 1.000–1.030)
Total Protein, Urine: NEGATIVE
Urine Glucose: NEGATIVE
Urobilinogen, UA: 0.2 (ref 0.0–1.0)
pH: 6 (ref 5.0–8.0)

## 2012-05-08 LAB — BASIC METABOLIC PANEL
BUN: 11 mg/dL (ref 6–23)
CO2: 28 mEq/L (ref 19–32)
Calcium: 9.4 mg/dL (ref 8.4–10.5)
Chloride: 101 mEq/L (ref 96–112)
Creatinine, Ser: 1 mg/dL (ref 0.4–1.2)
GFR: 58.42 mL/min — ABNORMAL LOW (ref >60.00)
Glucose, Bld: 82 mg/dL (ref 70–99)
Potassium: 4.5 mEq/L (ref 3.5–5.1)
Sodium: 138 mEq/L (ref 135–145)

## 2012-05-08 LAB — LIPASE: Lipase: 43 U/L (ref 11.0–59.0)

## 2012-05-08 LAB — SEDIMENTATION RATE: Sed Rate: 57 mm/hr — ABNORMAL HIGH (ref 0–22)

## 2012-05-08 MED ORDER — CIPROFLOXACIN HCL 500 MG PO TABS: 500.0000 mg | ORAL_TABLET | Freq: Two times a day (BID) | ORAL | Status: DC

## 2012-05-08 NOTE — Telephone Encounter (Signed)
Misty Stanley, please, inform patient that all labs are OK Plan - as e discussed Thx

## 2012-05-08 NOTE — Patient Instructions (Signed)
Low residue diet x 10 days 

## 2012-05-08 NOTE — Assessment & Plan Note (Signed)
Continue with current prescription therapy as reflected on the Med list.  

## 2012-05-08 NOTE — Assessment & Plan Note (Signed)
F/u w/Dr Leone Payor if needed

## 2012-05-08 NOTE — Assessment & Plan Note (Signed)
Labs Cipro x 10 d

## 2012-05-08 NOTE — Progress Notes (Signed)
Subjective:    Patient ID: Kaylee Hansen, female    DOB: Mar 29, 1956, 56 y.o.   MRN: [id number]  Abdominal Pain This is a new problem. The current episode started 1 to 4 weeks ago (2 wks). The onset quality is gradual. The problem occurs constantly. The problem has been gradually worsening. The pain is located in the LLQ. The pain is at a severity of 8/10. The pain is severe. The abdominal pain radiates to the RLQ. Associated symptoms include anorexia, constipation and diarrhea. Pertinent negatives include no frequency, headaches, nausea or vomiting. She has tried antacids for the symptoms. The treatment provided no relief. Prior diagnostic workup includes surgery and GI consult. Her past medical history is significant for abdominal surgery and gallstones.    F/u SOB at night with a h/o desaturation - doing well on O2; COPD, LBP, anxiety, siezures C/o skin lesions  BP Readings from Last 3 Encounters:  05/08/12 108/60  03/27/12 110/64  01/18/12 112/62      Review of Systems  Constitutional: Negative for chills, activity change, appetite change, fatigue and unexpected weight change.  HENT: Negative for nosebleeds, congestion, mouth sores and sinus pressure.   Eyes: Negative for visual disturbance.  Respiratory: Positive for apnea (at night). Negative for cough, chest tightness and shortness of breath.   Gastrointestinal: Positive for abdominal pain, diarrhea, constipation and anorexia. Negative for nausea, vomiting and anal bleeding.  Genitourinary: Negative for frequency, difficulty urinating and vaginal pain.  Musculoskeletal: Positive for back pain and gait problem (better).  Skin: Negative for pallor, rash and wound.  Neurological: Negative for dizziness, tremors, weakness, numbness and headaches.  Psychiatric/Behavioral: Positive for sleep disturbance and decreased concentration. Negative for suicidal ideas, behavioral problems, confusion, self-injury and agitation. The patient is  nervous/anxious.    Wt Readings from Last 3 Encounters:  05/08/12 177 lb (80.287 kg)  03/27/12 178 lb (80.74 kg)  01/18/12 174 lb 1.9 oz (78.98 kg)       Objective:   Physical Exam  Constitutional: She appears well-developed. No distress.       Obese, NAD  HENT:  Head: Normocephalic.  Right Ear: External ear normal.  Left Ear: External ear normal.  Nose: Nose normal.  Mouth/Throat: Oropharynx is clear and moist.  Eyes: Conjunctivae normal are normal. Pupils are equal, round, and reactive to light. Right eye exhibits no discharge. Left eye exhibits no discharge.  Neck: Normal range of motion. Neck supple. No JVD present. No tracheal deviation present. No thyromegaly present.  Cardiovascular: Normal rate, regular rhythm and normal heart sounds.   Pulmonary/Chest: No stridor. No respiratory distress. She has no wheezes. She exhibits no tenderness.  Abdominal: Soft. Bowel sounds are normal. She exhibits no distension and no mass. There is tenderness. There is no rebound and no guarding.       LLQ is tender  Musculoskeletal: She exhibits tenderness. She exhibits no edema.  Lymphadenopathy:    She has no cervical adenopathy.  Neurological: She displays normal reflexes. No cranial nerve deficit. She exhibits normal muscle tone. Coordination abnormal.       Better - out of a w/c now  Skin: Rash (intertrigo - suprapubic resolving) noted. No erythema.  Psychiatric: She has a normal mood and affect. Her behavior is normal. Judgment and thought content normal.   AKs, SKs Not toxic in appearance   Lab Results  Component Value Date   WBC 8.8 01/18/2012   HGB 13.8 01/18/2012   HCT 40.9 01/18/2012   PLT 260.0  01/18/2012   GLUCOSE 89 01/18/2012   CHOL 339* 08/12/2009   TRIG 298.0* 08/12/2009   HDL 43.10 08/12/2009   LDLDIRECT 232.7 08/12/2009   LDLCALC  Value: 240        Total Cholesterol/HDL:CHD Risk Coronary Heart Disease Risk Table                     Men   Women  1/2 Average Risk   3.4    3.3  Average Risk       5.0   4.4  2 X Average Risk   9.6   7.1  3 X Average Risk  23.4   11.0        Use the calculated Patient Ratio above and the CHD Risk Table to determine the patient's CHD Risk.        ATP III CLASSIFICATION (LDL):  <100     mg/dL   Optimal  191-478  mg/dL   Near or Above                    Optimal  130-159  mg/dL   Borderline  295-621  mg/dL   High  >308     mg/dL   Very High* 65/78/4696   ALT 17 01/18/2012   AST 15 01/18/2012   NA 136 01/18/2012   K 4.7 01/18/2012   CL 100 01/18/2012   CREATININE 1.1 01/18/2012   BUN 12 01/18/2012   CO2 28 01/18/2012   TSH 1.44 01/18/2012   INR 1.0 09/16/2008   HGBA1C  Value: 5.0 (NOTE)   The ADA recommends the following therapeutic goal for glycemic   control related to Hgb A1C measurement:   Goal of Therapy:   < 7.0% Hgb A1C   Reference: American Diabetes Association: Clinical Practice   Recommendations 2008, Diabetes Care,  2008, 31:(Suppl 1). 09/17/2008       Assessment & Plan:

## 2012-05-09 NOTE — Telephone Encounter (Signed)
Pt informed

## 2012-05-21 ENCOUNTER — Encounter: Payer: Self-pay | Admitting: Internal Medicine

## 2012-05-21 ENCOUNTER — Ambulatory Visit (INDEPENDENT_AMBULATORY_CARE_PROVIDER_SITE_OTHER)
Admission: RE | Admit: 2012-05-21 | Discharge: 2012-05-21 | Disposition: A | Discharge status: Home / Self Care | Payer: Medicare Other | Source: Ambulatory Visit | Attending: Internal Medicine | Admitting: Internal Medicine

## 2012-05-21 ENCOUNTER — Ambulatory Visit (INDEPENDENT_AMBULATORY_CARE_PROVIDER_SITE_OTHER): Payer: Medicare Other | Admitting: Internal Medicine

## 2012-05-21 ENCOUNTER — Telehealth: Payer: Self-pay | Admitting: Internal Medicine

## 2012-05-21 VITALS — BP 116/60 | HR 68 | Temp 97.7°F | Resp 16 | Wt 175.0 lb

## 2012-05-21 DIAGNOSIS — R143 Flatulence: Secondary | ICD-10-CM

## 2012-05-21 DIAGNOSIS — Z720 Tobacco use: Secondary | ICD-10-CM

## 2012-05-21 DIAGNOSIS — F329 Major depressive disorder, single episode, unspecified: Secondary | ICD-10-CM

## 2012-05-21 DIAGNOSIS — M359 Systemic involvement of connective tissue, unspecified: Secondary | ICD-10-CM

## 2012-05-21 DIAGNOSIS — R141 Gas pain: Secondary | ICD-10-CM

## 2012-05-21 DIAGNOSIS — R1084 Generalized abdominal pain: Secondary | ICD-10-CM

## 2012-05-21 DIAGNOSIS — F3289 Other specified depressive episodes: Secondary | ICD-10-CM

## 2012-05-21 DIAGNOSIS — E039 Hypothyroidism, unspecified: Secondary | ICD-10-CM

## 2012-05-21 DIAGNOSIS — F17200 Nicotine dependence, unspecified, uncomplicated: Secondary | ICD-10-CM

## 2012-05-21 MED ORDER — IOHEXOL 300 MG/ML  SOLN
100.0000 mL | Freq: Once | INTRAMUSCULAR | Status: AC | PRN
  Administered 2012-05-21: 100 mL via INTRAVENOUS

## 2012-05-21 NOTE — Assessment & Plan Note (Signed)
11/13 ?etiology A little better w/Cipro 

## 2012-05-21 NOTE — Assessment & Plan Note (Signed)
F/u w/Dr Gessner 

## 2012-05-21 NOTE — Assessment & Plan Note (Signed)
Continue with current prescription therapy as reflected on the Med list.  

## 2012-05-21 NOTE — Progress Notes (Signed)
Subjective:    Patient ID: Kaylee Hansen, female    DOB: [birth year], 56 y.o.   MRN: [id number] 2 wk f/u  Abdominal Pain This is a new problem. The current episode started more than 1 month ago (2 wks). The onset quality is gradual. The problem has been gradually improving. The pain is located in the LLQ. The pain is at a severity of 4/10. The pain is mild. The abdominal pain radiates to the RLQ. Associated symptoms include anorexia, constipation and diarrhea. Pertinent negatives include no frequency, headaches, nausea or vomiting. She has tried antacids for the symptoms. The treatment provided no relief. Prior diagnostic workup includes surgery and GI consult. Her past medical history is significant for abdominal surgery and gallstones.    F/u SOB at night with a h/o desaturation - doing well on O2; COPD, LBP, anxiety, siezures C/o skin lesions  BP Readings from Last 3 Encounters:  05/21/12 116/60  05/08/12 108/60  03/27/12 110/64      Review of Systems  Constitutional: Negative for chills, activity change, appetite change, fatigue and unexpected weight change.  HENT: Negative for nosebleeds, congestion, mouth sores and sinus pressure.   Eyes: Negative for visual disturbance.  Respiratory: Positive for apnea (at night). Negative for cough, chest tightness and shortness of breath.   Gastrointestinal: Positive for abdominal pain, diarrhea, constipation and anorexia. Negative for nausea, vomiting and anal bleeding.  Genitourinary: Negative for frequency, difficulty urinating and vaginal pain.  Musculoskeletal: Positive for back pain and gait problem (better).  Skin: Negative for pallor, rash and wound.  Neurological: Negative for dizziness, tremors, weakness, numbness and headaches.  Psychiatric/Behavioral: Positive for sleep disturbance and decreased concentration. Negative for suicidal ideas, behavioral problems, confusion, self-injury and agitation. The patient is nervous/anxious.      Wt Readings from Last 3 Encounters:  05/21/12 175 lb (79.379 kg)  05/08/12 177 lb (80.287 kg)  03/27/12 178 lb (80.74 kg)       Objective:   Physical Exam  Constitutional: She appears well-developed. No distress.       Obese, NAD  HENT:  Head: Normocephalic.  Right Ear: External ear normal.  Left Ear: External ear normal.  Nose: Nose normal.  Mouth/Throat: Oropharynx is clear and moist.  Eyes: Conjunctivae normal are normal. Pupils are equal, round, and reactive to light. Right eye exhibits no discharge. Left eye exhibits no discharge.  Neck: Normal range of motion. Neck supple. No JVD present. No tracheal deviation present. No thyromegaly present.  Cardiovascular: Normal rate, regular rhythm and normal heart sounds.   Pulmonary/Chest: No stridor. No respiratory distress. She has no wheezes. She exhibits no tenderness.  Abdominal: Soft. Bowel sounds are normal. She exhibits no distension and no mass. There is tenderness. There is no rebound and no guarding.       LLQ is tender  Musculoskeletal: She exhibits tenderness. She exhibits no edema.  Lymphadenopathy:    She has no cervical adenopathy.  Neurological: She displays normal reflexes. No cranial nerve deficit. She exhibits normal muscle tone. Coordination abnormal.       Better - out of a w/c now  Skin: Rash (intertrigo - suprapubic resolving) noted. No erythema.  Psychiatric: She has a normal mood and affect. Her behavior is normal. Judgment and thought content normal.   AKs, SKs Not toxic in appearance   Lab Results  Component Value Date   WBC 7.8 05/08/2012   HGB 12.4 05/08/2012   HCT 38.1 05/08/2012   PLT 295.0 05/08/2012  GLUCOSE 82 05/08/2012   CHOL 339* 08/12/2009   TRIG 298.0* 08/12/2009   HDL 43.10 08/12/2009   LDLDIRECT 232.7 08/12/2009   LDLCALC  Value: 240        Total Cholesterol/HDL:CHD Risk Coronary Heart Disease Risk Table                     Men   Women  1/2 Average Risk   3.4   3.3  Average Risk        5.0   4.4  2 X Average Risk   9.6   7.1  3 X Average Risk  23.4   11.0        Use the calculated Patient Ratio above and the CHD Risk Table to determine the patient's CHD Risk.        ATP III CLASSIFICATION (LDL):  <100     mg/dL   Optimal  161-096  mg/dL   Near or Above                    Optimal  130-159  mg/dL   Borderline  045-409  mg/dL   High  >811     mg/dL   Very High* 91/47/8295   ALT 14 05/08/2012   AST 15 05/08/2012   NA 138 05/08/2012   K 4.5 05/08/2012   CL 101 05/08/2012   CREATININE 1.0 05/08/2012   BUN 11 05/08/2012   CO2 28 05/08/2012   TSH 1.44 01/18/2012   INR 1.0 09/16/2008   HGBA1C  Value: 5.0 (NOTE)   The ADA recommends the following therapeutic goal for glycemic   control related to Hgb A1C measurement:   Goal of Therapy:   < 7.0% Hgb A1C   Reference: American Diabetes Association: Clinical Practice   Recommendations 2008, Diabetes Care,  2008, 31:(Suppl 1). 09/17/2008   09/18/10 abd CT: IMPRESSION:  Although the stomach is underdistended, no definite primary lesion  is identified. Apparent wall thickening at the body/antral junction  could be due to underdistension. No evidence of perigastric  abnormality or abdominal nodal/hepatic metastasis.  Original Report Authenticated By: Consuello Bossier, M.D.      Assessment & Plan:

## 2012-05-21 NOTE — Assessment & Plan Note (Signed)
Needs to quit.  

## 2012-05-21 NOTE — Telephone Encounter (Signed)
Kaylee Hansen, please, inform patient that her CT was ok. Pls see Dr Leone Payor if not better Thx

## 2012-05-21 NOTE — Telephone Encounter (Signed)
Called pt- she is not at home. Will try later.

## 2012-05-21 NOTE — Assessment & Plan Note (Signed)
11/13 ?etiology A little better w/Cipro

## 2012-05-22 ENCOUNTER — Telehealth: Payer: Self-pay

## 2012-05-22 NOTE — Telephone Encounter (Signed)
  Lanier Prude, [facility] 05/21/2012 5:01 PM Signed  Called pt- she is not at home. Will try later.  Sonda Primes, MD 05/21/2012 4:51 PM Signed  Misty Stanley, please, inform patient that her CT was ok. Pls see Dr Leone Payor if not better  Thx

## 2012-05-22 NOTE — Telephone Encounter (Signed)
Pt called requesting results of recent CT scan.

## 2012-05-24 NOTE — Telephone Encounter (Signed)
Pt advised and expressed understanding.

## 2012-05-24 NOTE — Telephone Encounter (Signed)
Pt advised.

## 2012-06-26 ENCOUNTER — Other Ambulatory Visit: Payer: Self-pay | Admitting: Internal Medicine

## 2012-07-03 ENCOUNTER — Ambulatory Visit (INDEPENDENT_AMBULATORY_CARE_PROVIDER_SITE_OTHER): Payer: Medicare Other | Admitting: Internal Medicine

## 2012-07-03 ENCOUNTER — Encounter: Payer: Self-pay | Admitting: Internal Medicine

## 2012-07-03 VITALS — BP 120/50 | HR 84 | Temp 98.5°F | Resp 16 | Wt 179.0 lb

## 2012-07-03 DIAGNOSIS — J449 Chronic obstructive pulmonary disease, unspecified: Secondary | ICD-10-CM

## 2012-07-03 DIAGNOSIS — E559 Vitamin D deficiency, unspecified: Secondary | ICD-10-CM

## 2012-07-03 DIAGNOSIS — Z720 Tobacco use: Secondary | ICD-10-CM

## 2012-07-03 DIAGNOSIS — K5900 Constipation, unspecified: Secondary | ICD-10-CM

## 2012-07-03 DIAGNOSIS — R1084 Generalized abdominal pain: Secondary | ICD-10-CM

## 2012-07-03 DIAGNOSIS — E785 Hyperlipidemia, unspecified: Secondary | ICD-10-CM

## 2012-07-03 DIAGNOSIS — M545 Low back pain, unspecified: Secondary | ICD-10-CM

## 2012-07-03 DIAGNOSIS — J4489 Other specified chronic obstructive pulmonary disease: Secondary | ICD-10-CM

## 2012-07-03 DIAGNOSIS — F17200 Nicotine dependence, unspecified, uncomplicated: Secondary | ICD-10-CM

## 2012-07-03 DIAGNOSIS — R569 Unspecified convulsions: Secondary | ICD-10-CM

## 2012-07-03 MED ORDER — LINACLOTIDE 290 MCG PO CAPS: 1.0000 | ORAL_CAPSULE | Freq: Every day | ORAL | Status: DC

## 2012-07-03 MED ORDER — ALPRAZOLAM 0.25 MG PO TABS: 0.2500 mg | ORAL_TABLET | Freq: Three times a day (TID) | ORAL | Status: DC | PRN

## 2012-07-03 MED ORDER — HYDROXYZINE HCL 50 MG PO TABS: 50.0000 mg | ORAL_TABLET | Freq: Every evening | ORAL | Status: DC | PRN

## 2012-07-03 MED ORDER — CALCITRIOL 0.25 MCG PO CAPS: 0.2500 ug | ORAL_CAPSULE | Freq: Every day | ORAL | Status: DC

## 2012-07-03 MED ORDER — LEVOTHYROXINE SODIUM 150 MCG PO TABS: 150.0000 ug | ORAL_TABLET | Freq: Every day | ORAL | Status: DC

## 2012-07-03 MED ORDER — HYDROCODONE-ACETAMINOPHEN 10-325 MG PO TABS: 1.0000 | ORAL_TABLET | Freq: Four times a day (QID) | ORAL | Status: DC | PRN

## 2012-07-03 MED ORDER — CYCLOSPORINE 0.05 % OP EMUL: 1.0000 [drp] | Freq: Two times a day (BID) | OPHTHALMIC | Status: DC

## 2012-07-03 NOTE — Assessment & Plan Note (Signed)
Better w/regular BMs Linzess Rx

## 2012-07-03 NOTE — Assessment & Plan Note (Signed)
Will try Linzess

## 2012-07-03 NOTE — Assessment & Plan Note (Signed)
Discussed.

## 2012-07-03 NOTE — Assessment & Plan Note (Signed)
Low fat diet

## 2012-07-03 NOTE — Assessment & Plan Note (Signed)
Continue with current prescription therapy as reflected on the Med list. Labs  

## 2012-07-03 NOTE — Assessment & Plan Note (Signed)
Continue with current prescription therapy as reflected on the Med list.  

## 2012-07-03 NOTE — Progress Notes (Signed)
Subjective:     HPI  F/u SOB at night with a h/o desaturation - doing well on O2; COPD, LBP, anxiety, siezures. C/o constipation, cramps  BP Readings from Last 3 Encounters:  07/03/12 120/50  05/21/12 116/60  05/08/12 108/60      Review of Systems  Constitutional: Negative for chills, activity change, appetite change, fatigue and unexpected weight change.  HENT: Negative for nosebleeds, congestion, mouth sores and sinus pressure.   Eyes: Negative for visual disturbance.  Respiratory: Positive for apnea (at night). Negative for cough, chest tightness and shortness of breath.   Gastrointestinal: Negative for anal bleeding.  Genitourinary: Negative for difficulty urinating and vaginal pain.  Musculoskeletal: Positive for back pain and gait problem (better).  Skin: Negative for pallor, rash and wound.  Neurological: Negative for dizziness, tremors, weakness and numbness.  Psychiatric/Behavioral: Positive for sleep disturbance and decreased concentration. Negative for suicidal ideas, behavioral problems, confusion, self-injury and agitation. The patient is nervous/anxious.    Wt Readings from Last 3 Encounters:  07/03/12 179 lb (81.194 kg)  05/21/12 175 lb (79.379 kg)  05/08/12 177 lb (80.287 kg)       Objective:   Physical Exam  Constitutional: She appears well-developed. No distress.       Obese, NAD  HENT:  Head: Normocephalic.  Right Ear: External ear normal.  Left Ear: External ear normal.  Nose: Nose normal.  Mouth/Throat: Oropharynx is clear and moist.  Eyes: Conjunctivae normal are normal. Pupils are equal, round, and reactive to light. Right eye exhibits no discharge. Left eye exhibits no discharge.  Neck: Normal range of motion. Neck supple. No JVD present. No tracheal deviation present. No thyromegaly present.  Cardiovascular: Normal rate, regular rhythm and normal heart sounds.   Pulmonary/Chest: No stridor. No respiratory distress. She has no wheezes. She  exhibits no tenderness.  Abdominal: Soft. Bowel sounds are normal. She exhibits no distension and no mass. There is tenderness. There is no rebound and no guarding.       LLQ is tender  Musculoskeletal: She exhibits tenderness. She exhibits no edema.  Lymphadenopathy:    She has no cervical adenopathy.  Neurological: She displays normal reflexes. No cranial nerve deficit. She exhibits normal muscle tone. Coordination abnormal.       Better - out of a w/c now  Skin: No rash noted. No erythema.  Psychiatric: She has a normal mood and affect. Her behavior is normal. Judgment and thought content normal.   AKs, SKs Not toxic in appearance   Lab Results  Component Value Date   WBC 7.8 05/08/2012   HGB 12.4 05/08/2012   HCT 38.1 05/08/2012   PLT 295.0 05/08/2012   GLUCOSE 82 05/08/2012   CHOL 339* 08/12/2009   TRIG 298.0* 08/12/2009   HDL 43.10 08/12/2009   LDLDIRECT 232.7 08/12/2009   LDLCALC  Value: 240        Total Cholesterol/HDL:CHD Risk Coronary Heart Disease Risk Table                     Men   Women  1/2 Average Risk   3.4   3.3  Average Risk       5.0   4.4  2 X Average Risk   9.6   7.1  3 X Average Risk  23.4   11.0        Use the calculated Patient Ratio above and the CHD Risk Table to determine the patient's CHD Risk.  ATP III CLASSIFICATION (LDL):  <100     mg/dL   Optimal  161-096  mg/dL   Near or Above                    Optimal  130-159  mg/dL   Borderline  045-409  mg/dL   High  >811     mg/dL   Very High* 91/47/8295   ALT 14 05/08/2012   AST 15 05/08/2012   NA 138 05/08/2012   K 4.5 05/08/2012   CL 101 05/08/2012   CREATININE 1.0 05/08/2012   BUN 11 05/08/2012   CO2 28 05/08/2012   TSH 1.44 01/18/2012   INR 1.0 09/16/2008   HGBA1C  Value: 5.0 (NOTE)   The ADA recommends the following therapeutic goal for glycemic   control related to Hgb A1C measurement:   Goal of Therapy:   < 7.0% Hgb A1C   Reference: American Diabetes Association: Clinical Practice   Recommendations  2008, Diabetes Care,  2008, 31:(Suppl 1). 09/17/2008   09/18/10 abd CT: IMPRESSION:  Although the stomach is underdistended, no definite primary lesion  is identified. Apparent wall thickening at the body/antral junction  could be due to underdistension. No evidence of perigastric  abnormality or abdominal nodal/hepatic metastasis.  Original Report Authenticated By: Consuello Bossier, M.D.      Assessment & Plan:

## 2012-08-22 DIAGNOSIS — R739 Hyperglycemia, unspecified: Secondary | ICD-10-CM | POA: Insufficient documentation

## 2012-08-22 DIAGNOSIS — D62 Acute posthemorrhagic anemia: Secondary | ICD-10-CM | POA: Insufficient documentation

## 2012-08-22 DIAGNOSIS — S22008A Other fracture of unspecified thoracic vertebra, initial encounter for closed fracture: Secondary | ICD-10-CM | POA: Insufficient documentation

## 2012-08-26 DIAGNOSIS — J95821 Acute postprocedural respiratory failure: Secondary | ICD-10-CM

## 2012-08-26 DIAGNOSIS — J13 Pneumonia due to Streptococcus pneumoniae: Secondary | ICD-10-CM | POA: Insufficient documentation

## 2012-08-26 HISTORY — DX: Acute postprocedural respiratory failure: J95.821

## 2012-08-28 DIAGNOSIS — E875 Hyperkalemia: Secondary | ICD-10-CM | POA: Insufficient documentation

## 2012-09-06 ENCOUNTER — Telehealth: Payer: Self-pay | Admitting: *Deleted
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
## 2012-09-09 NOTE — Telephone Encounter (Signed)
Clydie Braun informed MD will sign for orders via VM and to callback office with any questions/concerns.

## 2012-09-11 ENCOUNTER — Telehealth: Payer: Self-pay | Admitting: *Deleted

## 2012-09-11 NOTE — Telephone Encounter (Signed)
Caller left vm stating he has been seeing pt at home since her hospital stay. Today he noticed she was moderately congested and  her O2 sat was 85 with no O2 on. After she placed her O2 on it increased to 98%. Per caller the spouse states he will call and schedule a f/u.

## 2012-09-12 ENCOUNTER — Ambulatory Visit (INDEPENDENT_AMBULATORY_CARE_PROVIDER_SITE_OTHER)
Admission: RE | Admit: 2012-09-12 | Discharge: 2012-09-12 | Disposition: A | Discharge status: Home / Self Care | Payer: Medicare Other | Source: Ambulatory Visit | Attending: Internal Medicine | Admitting: Internal Medicine

## 2012-09-12 ENCOUNTER — Telehealth: Payer: Self-pay | Admitting: Internal Medicine

## 2012-09-12 ENCOUNTER — Ambulatory Visit (INDEPENDENT_AMBULATORY_CARE_PROVIDER_SITE_OTHER): Payer: Medicare Other | Admitting: Internal Medicine

## 2012-09-12 ENCOUNTER — Encounter: Payer: Self-pay | Admitting: Internal Medicine

## 2012-09-12 VITALS — BP 114/54 | HR 84 | Temp 98.2°F | Wt 172.0 lb

## 2012-09-12 DIAGNOSIS — R05 Cough: Secondary | ICD-10-CM

## 2012-09-12 DIAGNOSIS — R059 Cough, unspecified: Secondary | ICD-10-CM

## 2012-09-12 DIAGNOSIS — Z720 Tobacco use: Secondary | ICD-10-CM

## 2012-09-12 DIAGNOSIS — J189 Pneumonia, unspecified organism: Secondary | ICD-10-CM

## 2012-09-12 DIAGNOSIS — F17200 Nicotine dependence, unspecified, uncomplicated: Secondary | ICD-10-CM

## 2012-09-12 MED ORDER — DOXYCYCLINE HYCLATE 100 MG PO TABS: 100.0000 mg | ORAL_TABLET | Freq: Two times a day (BID) | ORAL | Status: DC

## 2012-09-12 MED ORDER — FENTANYL 12 MCG/HR TD PT72: 1.0000 | MEDICATED_PATCH | TRANSDERMAL | Status: DC

## 2012-09-12 MED ORDER — IPRATROPIUM-ALBUTEROL 0.5-2.5 (3) MG/3ML IN SOLN: 3.0000 mL | Freq: Four times a day (QID) | RESPIRATORY_TRACT | Status: DC

## 2012-09-12 MED ORDER — LEVOTHYROXINE SODIUM 150 MCG PO TABS: 150.0000 ug | ORAL_TABLET | Freq: Every day | ORAL | Status: DC
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
## 2012-09-12 NOTE — Telephone Encounter (Signed)
Misty Stanley, please, inform patient that her CXR was ok considering previous problems. Plan - as we discussed Thx

## 2012-09-13 NOTE — Telephone Encounter (Signed)
Left detailed mess informing pt of below.  

## 2012-09-14 ENCOUNTER — Other Ambulatory Visit: Payer: Self-pay | Admitting: Internal Medicine

## 2012-09-15 DIAGNOSIS — S2249XA Multiple fractures of ribs, unspecified side, initial encounter for closed fracture: Secondary | ICD-10-CM | POA: Insufficient documentation

## 2012-09-15 DIAGNOSIS — J189 Pneumonia, unspecified organism: Secondary | ICD-10-CM | POA: Insufficient documentation

## 2012-09-15 NOTE — Assessment & Plan Note (Signed)
Cont O2 ?

## 2012-09-15 NOTE — Assessment & Plan Note (Addendum)
It was treated - repeat CXR

## 2012-09-15 NOTE — Assessment & Plan Note (Signed)
Discussed again 
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
## 2012-10-02 ENCOUNTER — Ambulatory Visit (INDEPENDENT_AMBULATORY_CARE_PROVIDER_SITE_OTHER): Payer: Medicare Other | Admitting: Internal Medicine

## 2012-10-02 ENCOUNTER — Encounter: Payer: Self-pay | Admitting: Internal Medicine

## 2012-10-02 VITALS — BP 110/60 | HR 76 | Temp 98.2°F | Resp 16 | Wt 164.0 lb

## 2012-10-02 DIAGNOSIS — M545 Low back pain, unspecified: Secondary | ICD-10-CM

## 2012-10-02 DIAGNOSIS — K219 Gastro-esophageal reflux disease without esophagitis: Secondary | ICD-10-CM

## 2012-10-02 DIAGNOSIS — R1310 Dysphagia, unspecified: Secondary | ICD-10-CM

## 2012-10-02 DIAGNOSIS — J449 Chronic obstructive pulmonary disease, unspecified: Secondary | ICD-10-CM

## 2012-10-02 DIAGNOSIS — N6019 Diffuse cystic mastopathy of unspecified breast: Secondary | ICD-10-CM

## 2012-10-02 DIAGNOSIS — I10 Essential (primary) hypertension: Secondary | ICD-10-CM

## 2012-10-02 DIAGNOSIS — F17200 Nicotine dependence, unspecified, uncomplicated: Secondary | ICD-10-CM

## 2012-10-02 DIAGNOSIS — J4489 Other specified chronic obstructive pulmonary disease: Secondary | ICD-10-CM

## 2012-10-02 DIAGNOSIS — Z720 Tobacco use: Secondary | ICD-10-CM

## 2012-10-02 MED ORDER — TRAZODONE HCL 100 MG PO TABS: 100.0000 mg | ORAL_TABLET | Freq: Every day | ORAL | Status: DC

## 2012-10-02 MED ORDER — FENTANYL 12 MCG/HR TD PT72: 1.0000 | MEDICATED_PATCH | TRANSDERMAL | Status: DC

## 2012-10-02 MED ORDER — CALCITRIOL 0.25 MCG PO CAPS: 0.2500 ug | ORAL_CAPSULE | Freq: Every day | ORAL | Status: DC

## 2012-10-02 MED ORDER — IPRATROPIUM-ALBUTEROL 0.5-2.5 (3) MG/3ML IN SOLN: 3.0000 mL | Freq: Four times a day (QID) | RESPIRATORY_TRACT | Status: DC

## 2012-10-02 MED ORDER — GABAPENTIN 100 MG PO CAPS: 100.0000 mg | ORAL_CAPSULE | Freq: Three times a day (TID) | ORAL | Status: DC

## 2012-10-02 NOTE — Assessment & Plan Note (Signed)
Continue with current prescription therapy as reflected on the Med list.  
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
## 2012-10-02 NOTE — Assessment & Plan Note (Signed)
Dr Leone Payor Continue with current prescription therapy as reflected on the Med list.

## 2012-10-02 NOTE — Assessment & Plan Note (Signed)
Unable to quit. Discussed.

## 2012-10-24 ENCOUNTER — Encounter: Payer: Self-pay | Admitting: Internal Medicine

## 2012-11-06 ENCOUNTER — Ambulatory Visit (INDEPENDENT_AMBULATORY_CARE_PROVIDER_SITE_OTHER): Payer: Medicare Other | Admitting: Internal Medicine

## 2012-11-06 ENCOUNTER — Encounter: Payer: Self-pay | Admitting: Internal Medicine

## 2012-11-06 VITALS — BP 120/58 | HR 84 | Ht 65.0 in | Wt 163.0 lb

## 2012-11-06 DIAGNOSIS — R1319 Other dysphagia: Secondary | ICD-10-CM

## 2012-11-06 DIAGNOSIS — D214 Benign neoplasm of connective and other soft tissue of abdomen: Secondary | ICD-10-CM

## 2012-11-06 DIAGNOSIS — K432 Incisional hernia without obstruction or gangrene: Secondary | ICD-10-CM

## 2012-11-06 NOTE — Patient Instructions (Addendum)
You have been given a separate informational sheet regarding your tobacco use, the importance of quitting and local resources to help you quit.  We will contact you with a date and time that we can do your endoscopy at [facility] .  Purchase an abdominal binder to help with your hernia.  This is usually available from the home health stores.   Thank you for choosing me and [facility].  Iva Boop, M.D., [facility]

## 2012-11-06 NOTE — Progress Notes (Signed)
Subjective:    Patient ID: Kaylee Hansen, female    DOB: [birth year], 57 y.o.   MRN: [id number]  HPI The patient presents due to recurrent regurgitation and what sounds like dysphagia. Also has upper abdominal distention and bloating at times. Constipation generally not a problem on MiraLax. She also c/o problems with incisional hernia causing pain and irritation. She has a hx of GIST of stomach - not though to be symptomatic and not thought appropriate to resect because of that and co-morbidities.  Allergies  Allergen Reactions  . Atorvastatin   . Morphine   . Oxycodone Hcl   . Penicillins    Outpatient Prescriptions Prior to Visit  Medication Sig Dispense Refill  . ALPRAZolam (XANAX) 0.25 MG tablet Take 1 tablet (0.25 mg total) by mouth 3 (three) times daily as needed for sleep or anxiety. Take one tablet by mouth 2-3 times daily as needed for anxiety   (3 months supply)  270 tablet  1  . calcitRIOL (ROCALTROL) 0.25 MCG capsule Take 1 capsule (0.25 mcg total) by mouth daily.  90 capsule  3  . cholecalciferol (VITAMIN D) 1000 UNITS tablet Take 1,000 Units by mouth daily. 2 po qd      . cycloSPORINE (RESTASIS) 0.05 % ophthalmic emulsion Place 1 drop into both eyes 2 (two) times daily.  0.4 mL  0  . gabapentin (NEURONTIN) 100 MG capsule Take 1 capsule (100 mg total) by mouth 3 (three) times daily. 3 months supply  270 capsule  3  . HYDROcodone-acetaminophen (NORCO) 10-325 MG per tablet Take 1 tablet by mouth every 6 (six) hours as needed. 3 months suply  360 tablet  1  . hydrOXYzine (ATARAX/VISTARIL) 50 MG tablet Take 1 tablet (50 mg total) by mouth at bedtime as needed (insomnia).  90 tablet  1  . levothyroxine (SYNTHROID, LEVOTHROID) 150 MCG tablet Take 1 tablet (150 mcg total) by mouth daily.  90 tablet  3  . NON FORMULARY HHN- as directed       . NON FORMULARY Oxygen- use at night as directed       . pantoprazole (PROTONIX) 40 MG tablet TAKE 1 TABLET BY MOUTH EVERY DAY  90 tablet  3   . PARoxetine (PAXIL) 40 MG tablet Take 1 tablet (40 mg total) by mouth daily.  90 tablet  3  . phenytoin (DILANTIN) 100 MG ER capsule 1 in am and 2 qhs  270 capsule  3  . polyethylene glycol powder (GLYCOLAX/MIRALAX) powder TAKE 1 CAPFUL (17GM) DISSOLVED IN WATER TWICE A DAY  1581 g  3  . promethazine (PHENERGAN) 25 MG tablet Take 1 tablet (25 mg total) by mouth daily.  90 tablet  3  . traZODone (DESYREL) 100 MG tablet Take 1 tablet (100 mg total) by mouth at bedtime.  90 tablet  3  . zolmitriptan (ZOMIG-ZMT) 5 MG disintegrating tablet Take 1 tablet (5 mg total) by mouth as needed.  10 tablet  11  . doxycycline (VIBRA-TABS) 100 MG tablet Take 1 tablet (100 mg total) by mouth 2 (two) times daily.  60 tablet  0  . albuterol (PROVENTIL HFA;VENTOLIN HFA) 108 (90 BASE) MCG/ACT inhaler Inhale 2 puffs into the lungs every 6 (six) hours as needed for wheezing.  1 Inhaler  5  . bisacodyl (DULCOLAX) 5 MG EC tablet Take 5 mg by mouth as needed.        . fentaNYL (DURAGESIC) 12 MCG/HR Place 1 patch (12.5 mcg total) onto the skin every  3 (three) days. Please fill on or after 11/01/12  10 patch  0  . ipratropium-albuterol (DUONEB) 0.5-2.5 (3) MG/3ML SOLN Take 3 mLs by nebulization 4 (four) times daily.  120 mL  11  . ketoconazole (NIZORAL) 2 % cream Apply topically daily.  45 g  1  . Linaclotide 290 MCG CAPS Take 1 capsule by mouth daily.  90 capsule  3   No facility-administered medications prior to visit.   Past Medical History  Diagnosis Date  . Hx of cancer of lung 1999  . History of cholecystectomy   . Hx of appendectomy   . Hx of hysterectomy   . COPD (chronic obstructive pulmonary disease)   . Low back pain   . Osteoarthritis of knee     bilateral knee  . Seizures   . Hypothyroidism   . Anxiety   . Depression   . Stroke     CVA, hx of 97  . Hyperlipidemia   . Collagenous colitis   . Esophageal ulcer     04/2009 EGD  . Esophageal ulcer 04/2009  . Helicobacter pylori gastritis 2010     Pylera Tx  . Constipation     Chronic abdominal pain and constipation  . Colon adenomas 2011  . Diverticulosis   . GERD (gastroesophageal reflux disease)    Past Surgical History  Procedure Laterality Date  . Appendectomy    . Abdominal hysterectomy      complete 1992  . Cholecystectomy    . Total hip arthroplasty      Left  . Abdominal surgery      Exploratory  . Esophagogastroduodenoscopy      w/baloon x 2  . Colonoscopy w/ biopsies      multiple   History   Social History  . Marital Status: Married    Spouse Name: N/A    Number of Children: 2  . Years of Education: N/A   Occupational History  . disabled    Social History Main Topics  . Smoking status: Current Every Day Smoker -- 0.50 packs/day    Types: Cigarettes  . Smokeless tobacco: Never Used  . Alcohol Use: No  . Drug Use: No  . Sexually Active: Not Currently   Other Topics Concern  . None   Social History Narrative   Married   2 children   No regular exercise         Family History  Problem Relation Age of Onset  . Coronary artery disease Mother   . Diabetes Mother   . Diabetes Son   . Colon cancer Neg Hx     colon  . Coronary artery disease Other     grandmother, grandfather  . Kidney disease Other     aunt  . Hypertension Father     Review of Systems Able to walk but not far - uses wheelchair also Chronic cough Nocturnal Oxygen    Objective:   Physical Exam General:  NAD - chronically ill in w/c Eyes:   anicteric Lungs:  Coarse breath sounds Heart:  S1S2 no rubs, murmurs or gallops Abdomen:  Soft, small incisional hernia in upper abdomen midline, mildly tender, BS+ Ext:   no edema     Assessment & Plan:  Dysphagia  Gastric GIST, non-malignant  Incisional hernia  1. EGD/[facility] FriMay 16 730 2. Abdominal binder for hernia The risks and benefits as well as alternatives of endoscopic procedure(s) have been discussed and reviewed. All questions answered. The patient  agrees  to proceed. Further plans pending findings at EGD

## 2012-11-07 ENCOUNTER — Telehealth: Payer: Self-pay

## 2012-11-07 NOTE — Telephone Encounter (Signed)
Kaylee Hansen informed of EGD instructions for her procedure 11/08/12 at [facility].  Arrive at 6:30am for a 7:30am appointment per Terri the charge nurse, do not have to arrive at 6:00am because no one there that early.  Patient had no questions.

## 2012-11-08 ENCOUNTER — Encounter (HOSPITAL_COMMUNITY)
Admission: RE | Disposition: A | Discharge status: Home / Self Care | Payer: Self-pay | Source: Ambulatory Visit | Attending: Internal Medicine

## 2012-11-08 ENCOUNTER — Encounter (HOSPITAL_COMMUNITY): Payer: Self-pay | Admitting: Internal Medicine

## 2012-11-08 ENCOUNTER — Ambulatory Visit (HOSPITAL_COMMUNITY)
Admission: RE | Admit: 2012-11-08 | Discharge: 2012-11-08 | Disposition: A | Discharge status: Home / Self Care | Payer: Medicare Other | Source: Ambulatory Visit | Attending: Internal Medicine | Admitting: Internal Medicine

## 2012-11-08 DIAGNOSIS — R1319 Other dysphagia: Secondary | ICD-10-CM

## 2012-11-08 DIAGNOSIS — Z79899 Other long term (current) drug therapy: Secondary | ICD-10-CM | POA: Insufficient documentation

## 2012-11-08 DIAGNOSIS — D214 Benign neoplasm of connective and other soft tissue of abdomen: Secondary | ICD-10-CM | POA: Diagnosis present

## 2012-11-08 DIAGNOSIS — J449 Chronic obstructive pulmonary disease, unspecified: Secondary | ICD-10-CM | POA: Insufficient documentation

## 2012-11-08 DIAGNOSIS — J4489 Other specified chronic obstructive pulmonary disease: Secondary | ICD-10-CM | POA: Insufficient documentation

## 2012-11-08 DIAGNOSIS — K432 Incisional hernia without obstruction or gangrene: Secondary | ICD-10-CM | POA: Insufficient documentation

## 2012-11-08 DIAGNOSIS — Z8673 Personal history of transient ischemic attack (TIA), and cerebral infarction without residual deficits: Secondary | ICD-10-CM | POA: Insufficient documentation

## 2012-11-08 DIAGNOSIS — K222 Esophageal obstruction: Secondary | ICD-10-CM | POA: Diagnosis present

## 2012-11-08 DIAGNOSIS — K219 Gastro-esophageal reflux disease without esophagitis: Secondary | ICD-10-CM | POA: Insufficient documentation

## 2012-11-08 DIAGNOSIS — E785 Hyperlipidemia, unspecified: Secondary | ICD-10-CM | POA: Insufficient documentation

## 2012-11-08 HISTORY — PX: BALLOON DILATION: SHX5330

## 2012-11-08 HISTORY — PX: ESOPHAGOGASTRODUODENOSCOPY: SHX5428

## 2012-11-08 HISTORY — DX: Esophageal obstruction: K22.2

## 2012-11-08 SURGERY — EGD (ESOPHAGOGASTRODUODENOSCOPY)
Anesthesia: Moderate Sedation

## 2012-11-08 MED ORDER — MIDAZOLAM HCL 10 MG/2ML IJ SOLN
INTRAMUSCULAR | Status: DC | PRN
  Administered 2012-11-08 (×2): 2 mg via INTRAVENOUS

## 2012-11-08 MED ORDER — MIDAZOLAM HCL 10 MG/2ML IJ SOLN
INTRAMUSCULAR | Status: AC
  Filled 2012-11-08: qty 2

## 2012-11-08 MED ORDER — SODIUM CHLORIDE 0.9 % IV SOLN: INTRAVENOUS | Status: DC

## 2012-11-08 MED ORDER — BUTAMBEN-TETRACAINE-BENZOCAINE 2-2-14 % EX AERO
INHALATION_SPRAY | CUTANEOUS | Status: DC | PRN
  Administered 2012-11-08: 2 via TOPICAL

## 2012-11-08 MED ORDER — FENTANYL CITRATE 0.05 MG/ML IJ SOLN
INTRAMUSCULAR | Status: AC
  Filled 2012-11-08: qty 2

## 2012-11-08 MED ORDER — FENTANYL CITRATE 0.05 MG/ML IJ SOLN
INTRAMUSCULAR | Status: DC | PRN
  Administered 2012-11-08 (×2): 25 ug via INTRAVENOUS

## 2012-11-08 NOTE — Op Note (Signed)
[facility] [address] [place], [address]   ENDOSCOPY PROCEDURE REPORT  PATIENT: Kaylee Hansen, Kaylee Hansen  MR#: [id number] BIRTHDATE: 1956/02/12 , 56  yrs. old GENDER: Female ENDOSCOPIST: Iva Boop, MD, [facility] PROCEDURE DATE:  11/08/2012 PROCEDURE:  EGD, diagnostic and Maloney dilation of esophagus ASA CLASS:     Class III INDICATIONS:  Dysphagia. MEDICATIONS: Fentanyl 50 mcg IV and Versed 4 mg IV TOPICAL ANESTHETIC: Cetacaine Spray  DESCRIPTION OF PROCEDURE: After the risks benefits and alternatives of the procedure were thoroughly explained, informed consent was obtained.  The Pentax Gastroscope [id number] endoscope was introduced through the mouth and advanced to the second portion of the duodenum. Without limitations.  The instrument was slowly withdrawn as the mucosa was fully examined.        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  The stenosis was traversable with the endoscope.  There was a submucoasl bulge at the angularis consistent with known GIST (EUS Dx).  The remainder of the upper endoscopy exam was otherwise normal. Retroflexed views revealed no abnormalities.     The scope was then withdrawn from the patient and the procedure completed.  COMPLICATIONS: There were no complications. ENDOSCOPIC IMPRESSION: 1.   Stricture was found at the gastroesophageal junction - dilated 54 Fr 2.   Submucosal bulge at angularis consistent w/ known GIST - not an operative candidate - seems stable over the years 3.   The remainder of the upper endoscopy exam was otherwise normal  RECOMMENDATIONS: 1.  Clear liquids until , then soft foods rest iof day.  Resume prior diet tomorrow. 2.   Follow-up as needed  REPEAT EXAM: as needed  eSigned:  Iva Boop, MD, [facility] 11/08/2012 8:07 AM   CC:The Patient

## 2012-11-08 NOTE — Progress Notes (Signed)
Assessment performed by Dr. Leone Payor.  Unable to document due to computer glich.  Pt. Stable and no changes from previous assessment.

## 2012-11-11 ENCOUNTER — Encounter (HOSPITAL_COMMUNITY): Payer: Self-pay | Admitting: Internal Medicine

## 2012-12-02 ENCOUNTER — Encounter: Payer: Self-pay | Admitting: Internal Medicine

## 2012-12-02 ENCOUNTER — Ambulatory Visit (INDEPENDENT_AMBULATORY_CARE_PROVIDER_SITE_OTHER): Payer: Medicare Other | Admitting: Internal Medicine

## 2012-12-02 ENCOUNTER — Ambulatory Visit (INDEPENDENT_AMBULATORY_CARE_PROVIDER_SITE_OTHER): Payer: Medicare Other | Admitting: *Deleted

## 2012-12-02 VITALS — BP 130/60 | HR 68 | Temp 99.4°F | Resp 16 | Wt 163.0 lb

## 2012-12-02 DIAGNOSIS — R143 Flatulence: Secondary | ICD-10-CM

## 2012-12-02 DIAGNOSIS — E039 Hypothyroidism, unspecified: Secondary | ICD-10-CM

## 2012-12-02 DIAGNOSIS — N12 Tubulo-interstitial nephritis, not specified as acute or chronic: Secondary | ICD-10-CM

## 2012-12-02 DIAGNOSIS — K219 Gastro-esophageal reflux disease without esophagitis: Secondary | ICD-10-CM

## 2012-12-02 DIAGNOSIS — F329 Major depressive disorder, single episode, unspecified: Secondary | ICD-10-CM

## 2012-12-02 DIAGNOSIS — F3289 Other specified depressive episodes: Secondary | ICD-10-CM

## 2012-12-02 DIAGNOSIS — J4489 Other specified chronic obstructive pulmonary disease: Secondary | ICD-10-CM

## 2012-12-02 DIAGNOSIS — R141 Gas pain: Secondary | ICD-10-CM

## 2012-12-02 DIAGNOSIS — M545 Low back pain, unspecified: Secondary | ICD-10-CM

## 2012-12-02 DIAGNOSIS — D485 Neoplasm of uncertain behavior of skin: Secondary | ICD-10-CM

## 2012-12-02 DIAGNOSIS — J449 Chronic obstructive pulmonary disease, unspecified: Secondary | ICD-10-CM

## 2012-12-02 DIAGNOSIS — R569 Unspecified convulsions: Secondary | ICD-10-CM

## 2012-12-02 DIAGNOSIS — F411 Generalized anxiety disorder: Secondary | ICD-10-CM

## 2012-12-02 LAB — BASIC METABOLIC PANEL
BUN: 7 mg/dL (ref 6–23)
CO2: 30 mEq/L (ref 19–32)
Calcium: 9.3 mg/dL (ref 8.4–10.5)
Chloride: 109 mEq/L (ref 96–112)
Creatinine, Ser: 0.8 mg/dL (ref 0.4–1.2)
GFR: 74.79 mL/min (ref >60.00)
Glucose, Bld: 68 mg/dL — ABNORMAL LOW (ref 70–99)
Potassium: 4.3 mEq/L (ref 3.5–5.1)
Sodium: 144 mEq/L (ref 135–145)

## 2012-12-02 LAB — URINALYSIS, ROUTINE W REFLEX MICROSCOPIC
Bilirubin Urine: NEGATIVE
Ketones, ur: NEGATIVE
Nitrite: POSITIVE
Specific Gravity, Urine: >=1.03 (ref 1.000–1.030)
Urine Glucose: NEGATIVE
Urobilinogen, UA: 0.2 (ref 0.0–1.0)
pH: 6 (ref 5.0–8.0)

## 2012-12-02 LAB — HEPATIC FUNCTION PANEL
ALT: 11 U/L (ref 0–35)
AST: 15 U/L (ref 0–37)
Albumin: 3.4 g/dL — ABNORMAL LOW (ref 3.5–5.2)
Alkaline Phosphatase: 111 U/L (ref 39–117)
Bilirubin, Direct: 0 mg/dL (ref 0.0–0.3)
Total Bilirubin: 0.2 mg/dL — ABNORMAL LOW (ref 0.3–1.2)
Total Protein: 6.5 g/dL (ref 6.0–8.3)

## 2012-12-02 MED ORDER — CIPROFLOXACIN HCL 250 MG PO TABS: 250.0000 mg | ORAL_TABLET | Freq: Two times a day (BID) | ORAL | Status: DC

## 2012-12-02 NOTE — Assessment & Plan Note (Signed)
Continue with current prescription therapy as reflected on the Med list.  

## 2012-12-02 NOTE — Assessment & Plan Note (Signed)
Better  

## 2012-12-02 NOTE — Assessment & Plan Note (Signed)
UA
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
## 2012-12-02 NOTE — Assessment & Plan Note (Signed)
Skin bx 

## 2012-12-03 ENCOUNTER — Other Ambulatory Visit: Payer: Self-pay | Admitting: *Deleted

## 2012-12-03 LAB — PHENYTOIN LEVEL, TOTAL: Phenytoin Lvl: 13.1 ug/mL (ref 10.0–20.0)

## 2012-12-03 MED ORDER — CIPROFLOXACIN HCL 250 MG PO TABS: 250.0000 mg | ORAL_TABLET | Freq: Two times a day (BID) | ORAL | Status: DC

## 2012-12-09 ENCOUNTER — Encounter: Payer: Self-pay | Admitting: Internal Medicine

## 2012-12-13 ENCOUNTER — Telehealth: Payer: Self-pay | Admitting: *Deleted

## 2012-12-13 MED ORDER — UNABLE TO FIND: Status: DC

## 2012-12-13 NOTE — Telephone Encounter (Signed)
Ok Thx 

## 2012-12-13 NOTE — Telephone Encounter (Signed)
Pt is requesting a Rx for a walker with a seat to be mailed to her home. Please advise

## 2012-12-13 NOTE — Telephone Encounter (Signed)
Rx printed/pending MD sig. Will mail to pt at home address.

## 2012-12-24 ENCOUNTER — Ambulatory Visit (INDEPENDENT_AMBULATORY_CARE_PROVIDER_SITE_OTHER): Payer: Medicare Other | Admitting: Internal Medicine

## 2012-12-24 ENCOUNTER — Encounter: Payer: Self-pay | Admitting: Internal Medicine

## 2012-12-24 VITALS — BP 114/50 | HR 88 | Temp 97.6°F | Resp 16 | Wt 158.0 lb

## 2012-12-24 DIAGNOSIS — D485 Neoplasm of uncertain behavior of skin: Secondary | ICD-10-CM

## 2012-12-24 DIAGNOSIS — N12 Tubulo-interstitial nephritis, not specified as acute or chronic: Secondary | ICD-10-CM

## 2012-12-24 DIAGNOSIS — D489 Neoplasm of uncertain behavior, unspecified: Secondary | ICD-10-CM

## 2012-12-24 MED ORDER — CIPROFLOXACIN HCL 250 MG PO TABS: 250.0000 mg | ORAL_TABLET | Freq: Two times a day (BID) | ORAL | Status: DC

## 2012-12-24 NOTE — Assessment & Plan Note (Signed)
Cipro Rx given

## 2012-12-24 NOTE — Progress Notes (Signed)
Patient ID: Kaylee Hansen, female   DOB: Jul 31, 1955, 57 y.o.   MRN: [id number]   Procedure Note :     Procedure :  Skin biopsy   Indication:  Changing mole (s ),  Suspicious lesion(s)   Risks including unsuccessful procedure , bleeding, infection, bruising, scar, a need for another complete procedure and others were explained to the patient in detail as well as the benefits. Informed consent was obtained and signed.   The patient was placed in a decubitus position.  Lesion #1 on  R chest   measuring 9x4    mm   Skin over lesion #1  was prepped with Betadine and alcohol  and anesthetized with 1 cc of 2% lidocaine and epinephrine, using a 25-gauge 1 inch needle.  Shave biopsy with a sterile Dermablade was carried out in the usual fashion. Hyfrecator was used to destroy the rest of the lesion potentially left behind and for hemostasis. Band-Aid was applied with antibiotic ointment.    Lesion #2 on L back     measuring 6x5  mm   Skin over lesion #2  was prepped with Betadine and alcohol  and anesthetized with 1 cc of 2% lidocaine and epinephrine, using a 25-gauge 1 inch needle.  Shave biopsy with a sterile Dermablade was carried out in the usual fashion. Hyfrecator was used to destroy the rest of the lesion potentially left behind and for hemostasis. Band-Aid was applied with antibiotic ointment.  Lesion #3 on  R back  measuring 4x3  mm   Skin over lesion #3  was prepped with Betadine and alcohol  and anesthetized with 1 cc of 2% lidocaine and epinephrine, using a 25-gauge 1 inch needle.  Shave biopsy with a sterile Dermablade was carried out in the usual fashion. Hyfrecator was used to destroy the rest of the lesion potentially left behind and for hemostasis. Band-Aid was applied with antibiotic ointment.   Tolerated well. Complications none.      Postprocedure instructions :    A Band-Aid should be  changed twice daily. You can take a shower tomorrow.  Keep the wounds clean. You can  wash them with liquid soap and water. Pat dry with gauze or a Kleenex tissue  Before applying antibiotic ointment and a Band-Aid.   You need to report immediately  if fever, chills or any signs of infection develop.    The biopsy results should be available in 1 -2 weeks.   C/o UTI sx's came back - asking for Cipro refill Exam: NAD, HEENT moist, heart w/RRR, lungs CTA, abd S/NT

## 2012-12-24 NOTE — Assessment & Plan Note (Signed)
See procedure 

## 2012-12-24 NOTE — Patient Instructions (Addendum)
Postprocedure instructions :    A Band-Aid should be  changed twice daily. You can take a shower tomorrow.  Keep the wounds clean. You can wash them with liquid soap and water. Pat dry with gauze or a Kleenex tissue  Before applying antibiotic ointment and a Band-Aid.   You need to report immediately  if fever, chills or any signs of infection develop.    The biopsy results should be available in 1 -2 weeks. 

## 2013-01-17 ENCOUNTER — Other Ambulatory Visit: Payer: Self-pay | Admitting: *Deleted

## 2013-01-17 MED ORDER — HYDROCODONE-ACETAMINOPHEN 10-325 MG PO TABS: 1.0000 | ORAL_TABLET | Freq: Four times a day (QID) | ORAL | Status: DC | PRN

## 2013-01-17 MED ORDER — ALPRAZOLAM 0.25 MG PO TABS: 0.2500 mg | ORAL_TABLET | Freq: Three times a day (TID) | ORAL | Status: DC | PRN

## 2013-02-18 ENCOUNTER — Encounter: Payer: Self-pay | Admitting: Internal Medicine

## 2013-02-18 ENCOUNTER — Ambulatory Visit (INDEPENDENT_AMBULATORY_CARE_PROVIDER_SITE_OTHER)
Admission: RE | Admit: 2013-02-18 | Discharge: 2013-02-18 | Disposition: A | Discharge status: Home / Self Care | Payer: Medicare Other | Source: Ambulatory Visit | Attending: Internal Medicine | Admitting: Internal Medicine

## 2013-02-18 ENCOUNTER — Ambulatory Visit (INDEPENDENT_AMBULATORY_CARE_PROVIDER_SITE_OTHER): Payer: Medicare Other | Admitting: Internal Medicine

## 2013-02-18 VITALS — BP 120/50 | HR 84 | Temp 98.7°F | Wt 158.0 lb

## 2013-02-18 DIAGNOSIS — M79609 Pain in unspecified limb: Secondary | ICD-10-CM

## 2013-02-18 DIAGNOSIS — M79671 Pain in right foot: Secondary | ICD-10-CM

## 2013-02-18 IMAGING — CR DG ANKLE COMPLETE 3+V*R*
3 series · 3 of 3 positions shown · non-contrast
Comparison: None.

CLINICAL DATA: Right ankle pain after fall.

RIGHT ANKLE - COMPLETE 3+ VIEW

[view not recorded (1 of 3)]
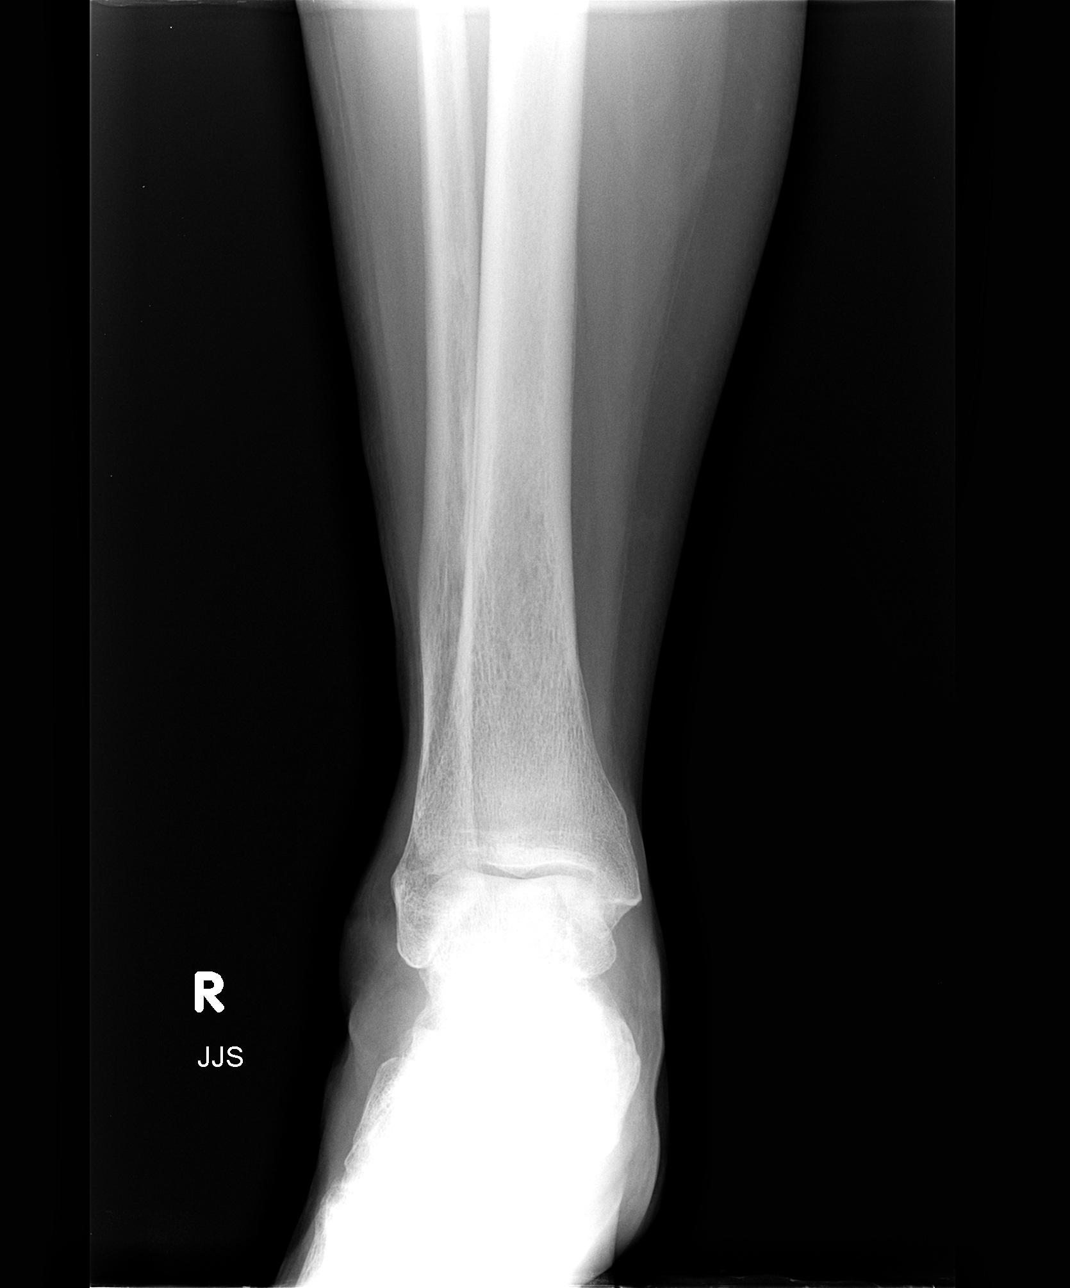

[view not recorded (2 of 3)]
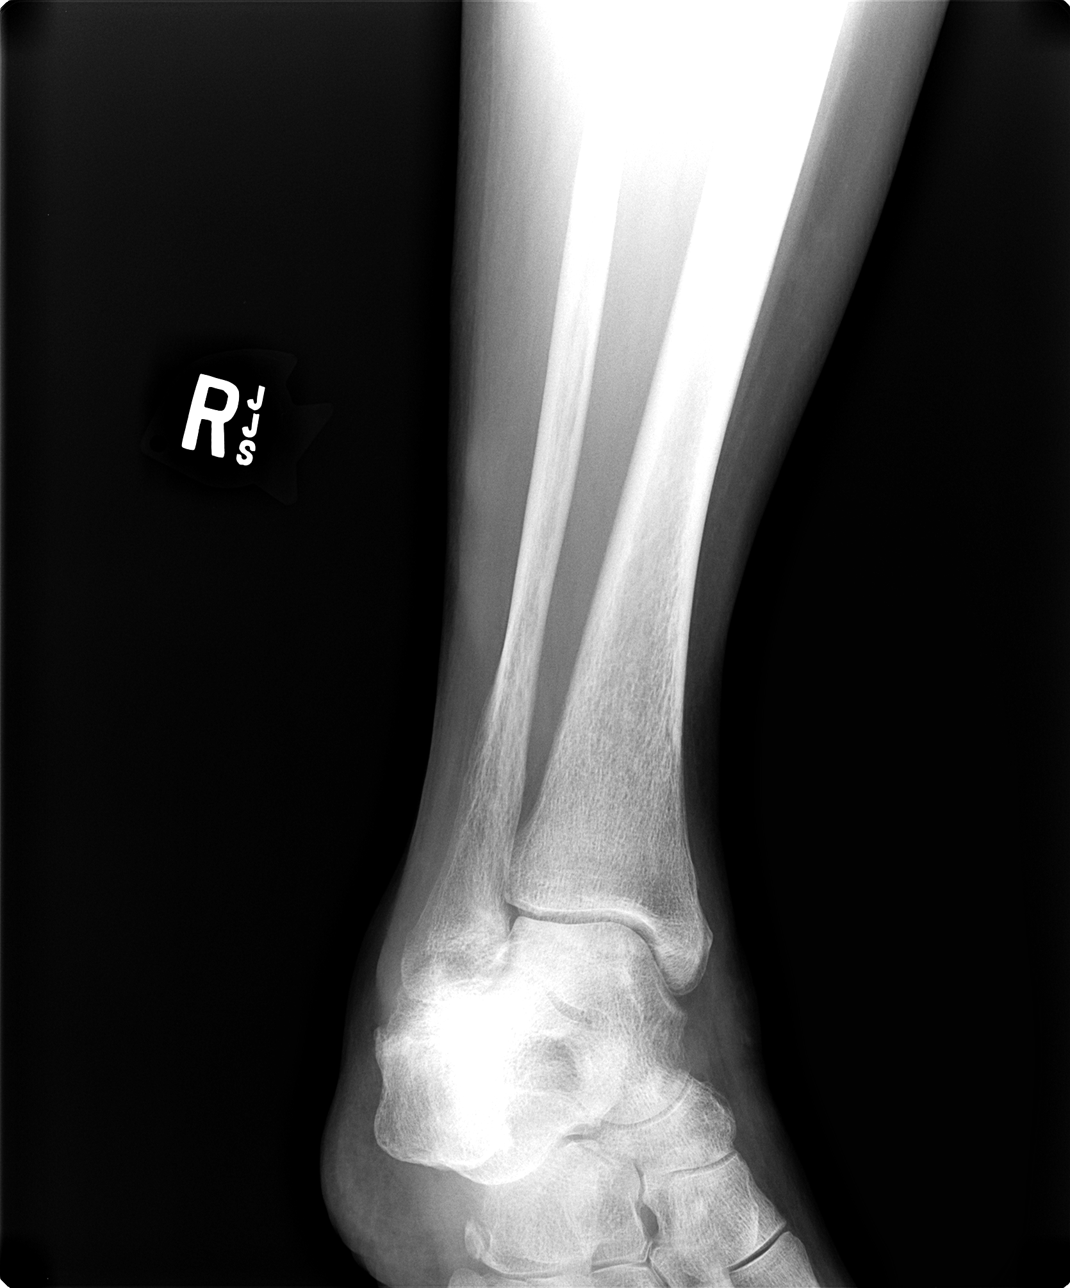

[view not recorded (3 of 3)]
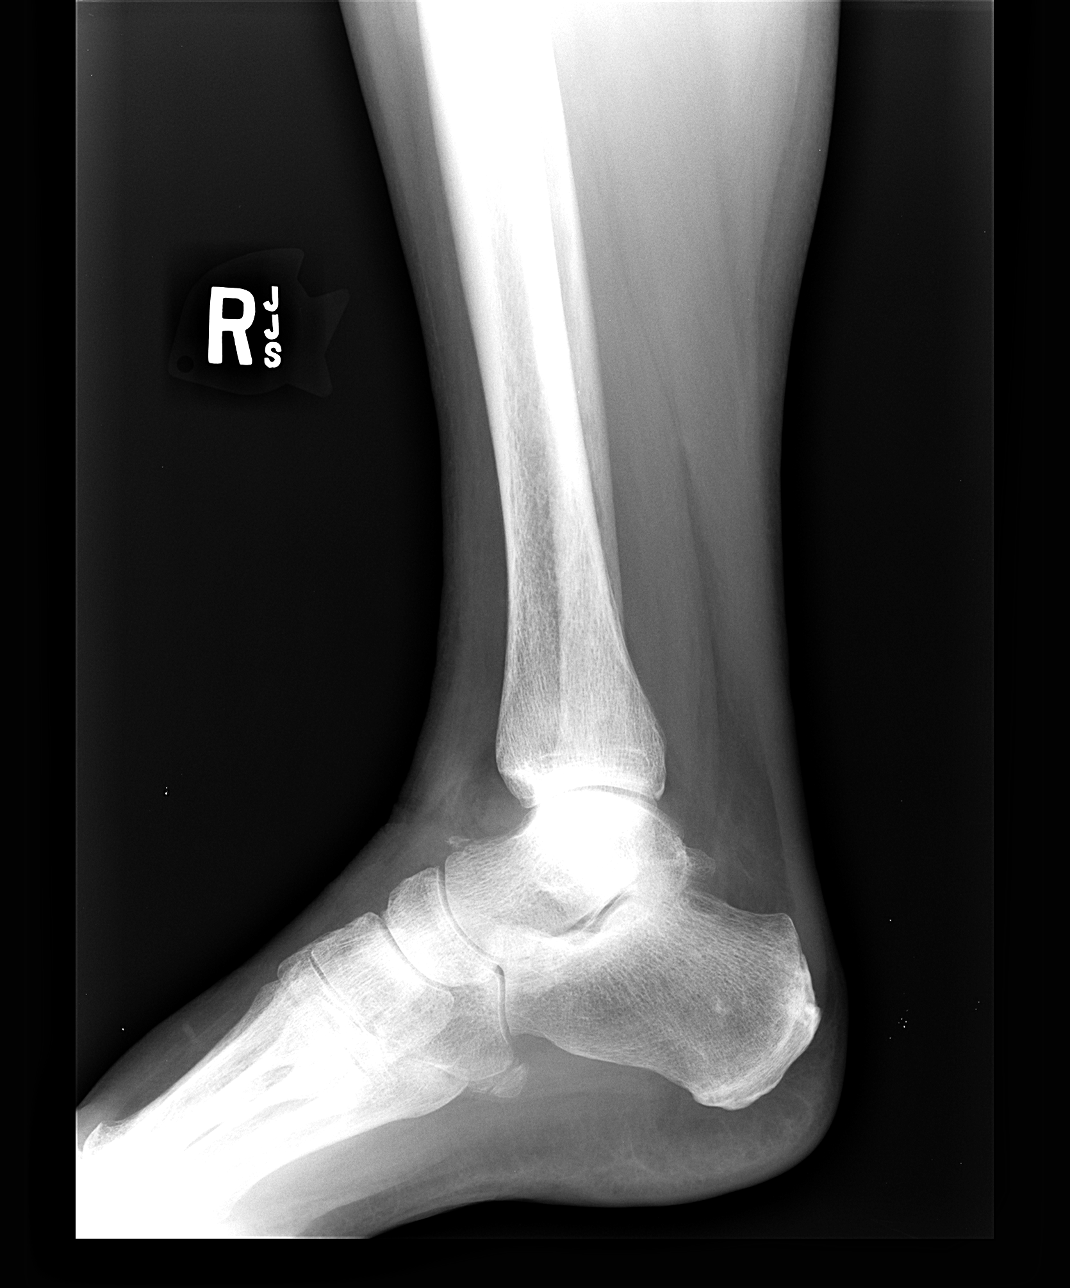

[3 of 3 positions shown; findings below may reference images not displayed]

FINDINGS: There is no evidence of fracture involving the distal
tibia or fibula.  Small bone fragment is seen arising from the
dorsal surface of the anterior talus which may represent small
fracture of indeterminate age.  Joint spaces are intact. No soft
tissue abnormality is noted.
IMPRESSION: Small bone fragment seen arising from dorsal surface of the
anterior talus concerning for fracture of indeterminate age.  No
abnormalities seen involving the distal right tibia or fibula.

## 2013-02-18 MED ORDER — FENTANYL 12 MCG/HR TD PT72: 1.0000 | MEDICATED_PATCH | TRANSDERMAL | Status: DC

## 2013-02-18 MED ORDER — KETOROLAC TROMETHAMINE 60 MG/2ML IM SOLN
30.0000 mg | Freq: Once | INTRAMUSCULAR | Status: AC
  Administered 2013-02-18: 30 mg via INTRAMUSCULAR

## 2013-02-18 MED ORDER — ZOLMITRIPTAN 5 MG PO TBDP: 5.0000 mg | ORAL_TABLET | ORAL | Status: DC | PRN

## 2013-02-18 NOTE — Progress Notes (Signed)
HPI  C/o a fall last night, twisted R foot: c/o pain - can't bear wt on it  F/u COPD, LBP, anxiety, siezures. C/o constipation, cramps C/o HH, food gets stuck in the throat, GERD C/o urinry urgency and frequency  BP Readings from Last 3 Encounters:  02/18/13 120/50  12/24/12 114/50  12/02/12 130/60      Review of Systems  Constitutional: Negative for activity change, appetite change, fatigue and unexpected weight change.  HENT: Negative for nosebleeds, congestion, mouth sores and sinus pressure.   Eyes: Negative for visual disturbance.  Respiratory: Positive for apnea (at night). Negative for chest tightness.   Gastrointestinal: Negative for anal bleeding.  Genitourinary: Negative for difficulty urinating and vaginal pain.  Musculoskeletal: Positive for back pain and gait problem (better).  Skin: Negative for pallor and wound.  Neurological: Negative for dizziness, tremors, weakness and numbness.  Psychiatric/Behavioral: Positive for sleep disturbance and decreased concentration. Negative for suicidal ideas, behavioral problems, confusion, self-injury and agitation. The patient is nervous/anxious.    Wt Readings from Last 3 Encounters:  02/18/13 158 lb (71.668 kg)  12/24/12 158 lb (71.668 kg)  12/02/12 163 lb (73.936 kg)       Objective:   Physical Exam  Constitutional: She appears well-developed. No distress.  Obese, NAD  HENT:  Head: Normocephalic.  Right Ear: External ear normal.  Left Ear: External ear normal.  Nose: Nose normal.  Mouth/Throat: Oropharynx is clear and moist.  Eyes: Conjunctivae are normal. Pupils are equal, round, and reactive to light. Right eye exhibits no discharge. Left eye exhibits no discharge.  Neck: Normal range of motion. Neck supple. No JVD present. No tracheal deviation present. No thyromegaly present.  Cardiovascular: Normal rate, regular rhythm and normal heart sounds.   Pulmonary/Chest: No stridor. No respiratory distress. She  has no wheezes. She has rales. She exhibits tenderness.  B breasts with fibrocystic changes  Abdominal: Soft. Bowel sounds are normal. She exhibits no distension and no mass. There is tenderness. There is no rebound and no guarding.  LLQ is tender  Musculoskeletal: She exhibits tenderness. She exhibits no edema.  Lymphadenopathy:    She has no cervical adenopathy.  Neurological: She displays normal reflexes. No cranial nerve deficit. She exhibits normal muscle tone. Coordination abnormal.  Better - out of a w/c now  Skin: No rash noted. No erythema.  Psychiatric: She has a normal mood and affect. Her behavior is normal. Judgment and thought content normal.  Mole on R chest AKs, SKs In a w/c R ankle and R dorsal foot are very tender   Lab Results  Component Value Date   WBC 7.8 05/08/2012   HGB 12.4 05/08/2012   HCT 38.1 05/08/2012   PLT 295.0 05/08/2012   GLUCOSE 68* 12/02/2012   CHOL 339* 08/12/2009   TRIG 298.0* 08/12/2009   HDL 43.10 08/12/2009   LDLDIRECT 232.7 08/12/2009   LDLCALC  Value: 240        Total Cholesterol/HDL:CHD Risk Coronary Heart Disease Risk Table                     Men   Women  1/2 Average Risk   3.4   3.3  Average Risk       5.0   4.4  2 X Average Risk   9.6   7.1  3 X Average Risk  23.4   11.0        Use the calculated Patient Ratio above and the  CHD Risk Table to determine the patient's CHD Risk.        ATP III CLASSIFICATION (LDL):  <100     mg/dL   Optimal  409-811  mg/dL   Near or Above                    Optimal  130-159  mg/dL   Borderline  914-782  mg/dL   High  >956     mg/dL   Very High* 21/30/8657   ALT 11 12/02/2012   AST 15 12/02/2012   NA 144 12/02/2012   K 4.3 12/02/2012   CL 109 12/02/2012   CREATININE 0.8 12/02/2012   BUN 7 12/02/2012   CO2 30 12/02/2012   TSH 1.44 01/18/2012   INR 1.0 09/16/2008   HGBA1C  Value: 5.0 (NOTE)   The ADA recommends the following therapeutic goal for glycemic   control related to Hgb A1C measurement:   Goal of Therapy:   < 7.0% Hgb  A1C   Reference: American Diabetes Association: Clinical Practice   Recommendations 2008, Diabetes Care,  2008, 31:(Suppl 1). 09/17/2008        Assessment & Plan:
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
## 2013-02-18 NOTE — Patient Instructions (Addendum)
Elevate R leg/foot Ice

## 2013-02-19 ENCOUNTER — Encounter: Payer: Self-pay | Admitting: Family Medicine

## 2013-02-19 ENCOUNTER — Ambulatory Visit (INDEPENDENT_AMBULATORY_CARE_PROVIDER_SITE_OTHER): Payer: Medicare Other | Admitting: Family Medicine

## 2013-02-19 VITALS — BP 120/60 | HR 78

## 2013-02-19 DIAGNOSIS — S92919A Unspecified fracture of unspecified toe(s), initial encounter for closed fracture: Secondary | ICD-10-CM

## 2013-02-19 DIAGNOSIS — S92401A Displaced unspecified fracture of right great toe, initial encounter for closed fracture: Secondary | ICD-10-CM

## 2013-02-19 NOTE — Patient Instructions (Signed)
Very nice to meet you We will try the cast but try to avoid any walking as much as possible for the next 3 weeks Ice baths 20 minutes 2 times a day can be helpful.  Continue compression next 3 days.  You can take compressoin and boot off to sleep bu tif you get up you need the boot.  I will see you again in 3 weeks and we will xray again.

## 2013-02-19 NOTE — Assessment & Plan Note (Signed)
Patient does have an intra-articular fracture that is nondisplaced of the first proximal phalanx. This involves approximately 25% of the joint. Had a long discussion with the patient as well as her husband who is her primary caregiver about potential treatment options. We discussed conservative therapy versus surgical management. Told her at this point with all her other comorbidities that the choice was up to her but she leaned towards conservative therapy which I do agree with a trial. Patient was given a Cam Walker today and discussed that I would avoid any weight-bearing as possible for the next 3 weeks. Discussed icing and she'll continue her pain medications that she gets from her primary care provider. We will see patient again in 3 weeks for reevaluation. At that time we will get x-rays right foot complete to see if we see any bony healing. Patient likely will be in the [facility] for a total of 12 weeks which hopefully getting her up and walking at 3-6 weeks past injury.

## 2013-02-19 NOTE — Progress Notes (Signed)
I'm seeing this patient by the request  of:  Dr. Posey Rea  CC: Right foot and toe pain  HPI: Patient is a 57 year old very pleasant female coming in with a complaint of right toe pain. Patient did see primary provider yesterday and was told that she had a fracture of her toe. She is here for evaluation and management. Patient fell 2 nights ago and seem to stop her large toe. Patient also, she twisted her right ankle. Patient states that she is having significant pain and is unable to ambulate. Patient unfortunately has not been active secondary to her other comorbidities which include lung cancer with metastasis, adrenal insufficiency, history of stroke. Patient describes the pain as severe when she puts any weight on it but states more of her pain is at her ankle. Patient denies any numbness or tingling, states that the swelling that she had previously seems to be getting better. Patient is a severity of 8/10. Patient has been wearing a compression wrap which has been somewhat beneficial. Patient regularly he used to ambulate with the aid of a cane but now has been in a wheelchair since injury.  Past medical, surgical, family and social history reviewed. Medications reviewed all in the electronic medical record.   Review of Systems  Constitutional: Negative for activity change, appetite change, fatigue and unexpected weight change.  HENT: Negative for nosebleeds, congestion, mouth sores and sinus pressure.  Eyes: Negative for visual disturbance.  Respiratory: Positive for apnea (at night). Negative for chest tightness.  Gastrointestinal: Negative for anal bleeding.  Genitourinary: Negative for difficulty urinating and vaginal pain.  Musculoskeletal: Positive for back pain and gait problem (better).  Skin: Negative for pallor and wound.  Neurological: Negative for dizziness, tremors, weakness and numbness.  Psychiatric/Behavioral: Positive for sleep disturbance and decreased concentration.  Negative for suicidal ideas, behavioral problems, confusion, self-injury and agitation. The patient is nervous/anxious.    Objective:    Blood pressure 120/60, pulse 78, SpO2 94.00%.   General: No apparent distress alert and oriented x3 mood and affect normal, dressed appropriately. Mild Steri-Strip on the left HEENT: Pupils equal, extraocular movements intact Respiratory: Patient's speak in full sentences and does not appear short of breath Cardiovascular: No lower extremity edema, non tender, no erythema Skin: Warm dry intact with no signs of infection or rash on extremities or on axial skeleton. Abdomen: Soft nontender Neuro: Cranial nerves II through XII are intact, neurovascularly intact in all extremities with 2+ DTRs and 2+ pulses. Lymph: No lymphadenopathy of posterior or anterior cervical chain or axillae bilaterally.  Gait normal with good balance and coordination.  MSK: Patient does have some mild weakness with 3+ out of 5 strength of the upper and lower extremity compared to 5/5 strength on her right side. Patient does have history of stroke. She seems to be neurovascularly intact. Patient does have some mild decreased tone of the muscles on the left compared to the right side of her body. Right foot exam: On inspection patient does have trace dorsal swelling of the large toe as well as the lateral aspect of the right ankle. Patient is very tender to palpation over the medial lateral malleolus as well as the first MTP joint. She is neurovascularly intact distally. She is unable to bear weight range of motion of right ankle is good but restricted secondary to pain. Patient's left foot is unremarkable. X-rays were reviewed and interpreted by me today. Patient's x-rays show that patient has a nondisplaced intra-articular fracture at the base  of the first proximal phalanx. No angulation is appreciated.  Impression and Recommendations:    No problem-specific assessment & plan notes found  for this encounter.   This case required medical decision making of moderate complexity.

## 2013-02-22 ENCOUNTER — Encounter: Payer: Self-pay | Admitting: Internal Medicine

## 2013-03-04 ENCOUNTER — Telehealth: Payer: Self-pay | Admitting: *Deleted

## 2013-03-04 NOTE — Telephone Encounter (Signed)
Pt called states she is experiencing burning at top of her foot from the bandage and boot.  Please advise

## 2013-03-04 NOTE — Telephone Encounter (Signed)
If you could call patient back tell her to always wear socks.   Consider a wrap like an Ace bandage over the portion of her foot which could be helpful as well.   Also use lotion on her feet 2 times daily will help.

## 2013-03-04 NOTE — Telephone Encounter (Signed)
Spoke with pt, advised of MDs message 

## 2013-03-05 ENCOUNTER — Encounter: Payer: Self-pay | Admitting: Internal Medicine

## 2013-03-05 ENCOUNTER — Other Ambulatory Visit (INDEPENDENT_AMBULATORY_CARE_PROVIDER_SITE_OTHER): Payer: Medicare Other

## 2013-03-05 ENCOUNTER — Ambulatory Visit (INDEPENDENT_AMBULATORY_CARE_PROVIDER_SITE_OTHER): Payer: Medicare Other | Admitting: Internal Medicine

## 2013-03-05 VITALS — BP 118/50 | HR 76 | Temp 98.9°F | Resp 16 | Wt 163.0 lb

## 2013-03-05 DIAGNOSIS — Z23 Encounter for immunization: Secondary | ICD-10-CM

## 2013-03-05 DIAGNOSIS — S92401D Displaced unspecified fracture of right great toe, subsequent encounter for fracture with routine healing: Secondary | ICD-10-CM

## 2013-03-05 DIAGNOSIS — IMO0001 Reserved for inherently not codable concepts without codable children: Secondary | ICD-10-CM

## 2013-03-05 DIAGNOSIS — M79609 Pain in unspecified limb: Secondary | ICD-10-CM

## 2013-03-05 DIAGNOSIS — M79671 Pain in right foot: Secondary | ICD-10-CM

## 2013-03-05 DIAGNOSIS — R300 Dysuria: Secondary | ICD-10-CM

## 2013-03-05 DIAGNOSIS — F411 Generalized anxiety disorder: Secondary | ICD-10-CM

## 2013-03-05 LAB — URINALYSIS
Bilirubin Urine: NEGATIVE
Hgb urine dipstick: NEGATIVE
Ketones, ur: NEGATIVE
Leukocytes, UA: NEGATIVE
Nitrite: NEGATIVE
Specific Gravity, Urine: 1.02 (ref 1.000–1.030)
Total Protein, Urine: NEGATIVE
Urine Glucose: NEGATIVE
Urobilinogen, UA: 0.2 (ref 0.0–1.0)
pH: 6 (ref 5.0–8.0)

## 2013-03-05 MED ORDER — PHENYTOIN SODIUM EXTENDED 100 MG PO CAPS: ORAL_CAPSULE | ORAL | Status: DC

## 2013-03-05 MED ORDER — FENTANYL 12 MCG/HR TD PT72: 1.0000 | MEDICATED_PATCH | TRANSDERMAL | Status: DC

## 2013-03-05 MED ORDER — PAROXETINE HCL 40 MG PO TABS: 40.0000 mg | ORAL_TABLET | Freq: Every day | ORAL | Status: DC

## 2013-03-05 MED ORDER — HYDROXYZINE HCL 50 MG PO TABS: 50.0000 mg | ORAL_TABLET | Freq: Every evening | ORAL | Status: DC | PRN

## 2013-03-05 MED ORDER — HYDROCODONE-ACETAMINOPHEN 10-325 MG PO TABS: 1.0000 | ORAL_TABLET | Freq: Four times a day (QID) | ORAL | Status: DC | PRN

## 2013-03-05 NOTE — Assessment & Plan Note (Signed)
Patient does have an intra-articular fracture that is nondisplaced of the first proximal phalanx. This involves approximately 25% of the joint. Cam walker Dr Katrinka Blazing 9/17 appt

## 2013-03-05 NOTE — Progress Notes (Signed)
Patient ID: Kaylee Hansen, female   DOB: 1955-09-29, 57 y.o.   MRN: [id number]     HPI  F/u a fall on 8/26, twisted R foot. Saw Dr Katrinka Blazing  Patient does have an intra-articular fracture that is nondisplaced of the first proximal phalanx. This involves approximately 25% of the joint. In a CAM walker boot  F/u COPD, LBP, anxiety, siezures. C/o constipation, cramps C/o HH, food gets stuck in the throat, GERD C/o urinry urgency and frequency  BP Readings from Last 3 Encounters:  03/05/13 118/50  02/19/13 120/60  02/18/13 120/50      Review of Systems  Constitutional: Negative for activity change, appetite change, fatigue and unexpected weight change.  HENT: Negative for nosebleeds, congestion, mouth sores and sinus pressure.   Eyes: Negative for visual disturbance.  Respiratory: Positive for apnea (at night). Negative for chest tightness.   Gastrointestinal: Negative for anal bleeding.  Genitourinary: Negative for difficulty urinating and vaginal pain.  Musculoskeletal: Positive for back pain and gait problem (better).  Skin: Negative for pallor and wound.  Neurological: Negative for dizziness, tremors, weakness and numbness.  Psychiatric/Behavioral: Positive for sleep disturbance and decreased concentration. Negative for suicidal ideas, behavioral problems, confusion, self-injury and agitation. The patient is nervous/anxious.    Wt Readings from Last 3 Encounters:  03/05/13 163 lb (73.936 kg)  02/18/13 158 lb (71.668 kg)  12/24/12 158 lb (71.668 kg)       Objective:   Physical Exam  Constitutional: She appears well-developed. No distress.  Obese, NAD  HENT:  Head: Normocephalic.  Right Ear: External ear normal.  Left Ear: External ear normal.  Nose: Nose normal.  Mouth/Throat: Oropharynx is clear and moist.  Eyes: Conjunctivae are normal. Pupils are equal, round, and reactive to light. Right eye exhibits no discharge. Left eye exhibits no discharge.  Neck: Normal range  of motion. Neck supple. No JVD present. No tracheal deviation present. No thyromegaly present.  Cardiovascular: Normal rate, regular rhythm and normal heart sounds.   Pulmonary/Chest: No stridor. No respiratory distress. She has no wheezes. She has rales. She exhibits tenderness.  B breasts with fibrocystic changes  Abdominal: Soft. Bowel sounds are normal. She exhibits no distension and no mass. There is tenderness. There is no rebound and no guarding.  LLQ is tender  Musculoskeletal: She exhibits tenderness. She exhibits no edema.  Lymphadenopathy:    She has no cervical adenopathy.  Neurological: She displays normal reflexes. No cranial nerve deficit. She exhibits normal muscle tone. Coordination abnormal.  Better - out of a w/c now  Skin: No rash noted. No erythema.  Psychiatric: She has a normal mood and affect. Her behavior is normal. Judgment and thought content normal.  Mole on R chest AKs, SKs In a w/c R ankle and R dorsal foot are less tender   Lab Results  Component Value Date   WBC 7.8 05/08/2012   HGB 12.4 05/08/2012   HCT 38.1 05/08/2012   PLT 295.0 05/08/2012   GLUCOSE 68* 12/02/2012   CHOL 339* 08/12/2009   TRIG 298.0* 08/12/2009   HDL 43.10 08/12/2009   LDLDIRECT 232.7 08/12/2009   LDLCALC  Value: 240        Total Cholesterol/HDL:CHD Risk Coronary Heart Disease Risk Table                     Men   Women  1/2 Average Risk   3.4   3.3  Average Risk       5.0  4.4  2 X Average Risk   9.6   7.1  3 X Average Risk  23.4   11.0        Use the calculated Patient Ratio above and the CHD Risk Table to determine the patient's CHD Risk.        ATP III CLASSIFICATION (LDL):  <100     mg/dL   Optimal  161-096  mg/dL   Near or Above                    Optimal  130-159  mg/dL   Borderline  045-409  mg/dL   High  >811     mg/dL   Very High* 91/47/8295   ALT 11 12/02/2012   AST 15 12/02/2012   NA 144 12/02/2012   K 4.3 12/02/2012   CL 109 12/02/2012   CREATININE 0.8 12/02/2012   BUN 7 12/02/2012    CO2 30 12/02/2012   TSH 1.44 01/18/2012   INR 1.0 09/16/2008   HGBA1C  Value: 5.0 (NOTE)   The ADA recommends the following therapeutic goal for glycemic   control related to Hgb A1C measurement:   Goal of Therapy:   < 7.0% Hgb A1C   Reference: American Diabetes Association: Clinical Practice   Recommendations 2008, Diabetes Care,  2008, 31:(Suppl 1). 09/17/2008        Assessment & Plan:

## 2013-03-05 NOTE — Assessment & Plan Note (Signed)
Patient does have an intra-articular fracture that is nondisplaced of the first proximal phalanx. This involves approximately 25% of the joint. Cam walker  Dr Katrinka Blazing Continue with current prescription therapy as reflected on the Med list.

## 2013-03-05 NOTE — Assessment & Plan Note (Signed)
Continue with current prescription therapy as reflected on the Med list.  

## 2013-03-05 NOTE — Assessment & Plan Note (Signed)
UA

## 2013-03-12 ENCOUNTER — Ambulatory Visit (INDEPENDENT_AMBULATORY_CARE_PROVIDER_SITE_OTHER)
Admission: RE | Admit: 2013-03-12 | Discharge: 2013-03-12 | Disposition: A | Discharge status: Home / Self Care | Payer: Medicare Other | Source: Ambulatory Visit | Attending: Family Medicine | Admitting: Family Medicine

## 2013-03-12 ENCOUNTER — Encounter: Payer: Self-pay | Admitting: Family Medicine

## 2013-03-12 ENCOUNTER — Ambulatory Visit (INDEPENDENT_AMBULATORY_CARE_PROVIDER_SITE_OTHER): Payer: Medicare Other | Admitting: Family Medicine

## 2013-03-12 VITALS — BP 112/60 | HR 82

## 2013-03-12 DIAGNOSIS — S92919A Unspecified fracture of unspecified toe(s), initial encounter for closed fracture: Secondary | ICD-10-CM

## 2013-03-12 DIAGNOSIS — S92401A Displaced unspecified fracture of right great toe, initial encounter for closed fracture: Secondary | ICD-10-CM

## 2013-03-12 NOTE — Patient Instructions (Signed)
Good to see you We will get xray today and no news is good news.  Continue the boot.  Try to avoid walking for another 2 weeks.  Come out of the boot 3 times a day to move ankle but try not to move toes too much.  Come back in 4 week and we will get xray again.  Come 30 minutes before appointment and go to xray and then we can go over together.

## 2013-03-12 NOTE — Progress Notes (Signed)
  Subjective:    CC: Followup of right first toe intra-articular fracture  HPI: Patient is a 57 year old female following up for her toe fracture. Patient states that she's been doing very well. Patient has been mostly nonweightbearing but that time has been walking in her Cam Walker. Patient states that she can have some discomfort but overall think she is getting better day by day. Patient states that she is approximately 30% better. She still has some soreness in continues to take her medication she gets from her primary care provider. Patient denies any new symptoms such as numbness, tingling, or swelling. Patient does state that her ankle seems to be somewhat stiff. Patient states that the stiffness can cause some discomfort from time to time. Patient has not stopped smoking  Past medical history, Surgical history, Family history not pertinant except as noted below, Social history, Allergies, and medications have been entered into the medical record, reviewed, and no changes needed.   Review of Systems: No fevers, chills, night sweats, weight loss, chest pain, or shortness of breath.   Objective:    General: Well Developed, well nourished, and in no acute distress.  Neuro: Alert and oriented x3, extra-ocular muscles intact, sensation grossly intact.  HEENT: Normocephalic, atraumatic, pupils equal round reactive to light, neck supple, no masses, no lymphadenopathy, thyroid nonpalpable.  Skin: Warm and dry, no rashes. Cardiac:  no lower extremity edema. Respiratory: Not using accessory muscles, speaking in full sentences. Abdominal: NT, soft Gait: Nonantlagic, good balance and coordination Lymphatic: no lymphadenopathy in neck or axillae on palpation, non tender.  Musculoskeletal: Inspection and palpation of the right and left upper extremities including the shoulders elbows and wrist are unremarkable with full range of motion and good muscle strength and tone. Inspection and palpation of  the right and left lower extremities including the hips knees and ankles are unremarkable and nontender with full range of motion and good muscle strength and tone and are symmetric. Foot exam shows the patient has no swelling. Patient has no significant abnormality on exam. She is still tender to palpation especially over the MTP joint of the first toe. Patient is also sore around the ankle secondary to stiffness. Patient does have decreased range of motion compared to the contralateral side in flexion extension. There is no pain over the lateral malleolus or medial malleolus. No pain over the navicular bone. She is neurovascularly intact distally.  Muscular skeletal ultrasound was performed and interpreted by me today. Limited ultrasound of the first toe of the MTP joint no significant displacement of the first proximal phalanx but there is still a fracture noted. Patient does have soft callus formation occurring but very slowly.  X-rays were ordered, reviewed and interpreted by me today. X-rays of the right foot shows   Impression and Recommendations:

## 2013-03-12 NOTE — Assessment & Plan Note (Signed)
Patient at this time continues to do well. Patient will continue with the Cam Walker boots and do weightbearing toe touch as tolerated. Patient will do this for another 2 weeks. Patient knows that this may be a longer process secondary to her comorbidities as well as smoking. Patient can start walking on it in the [employer] starting in 2 weeks. I would like to see her again in 4 weeks and we will get another x-ray to make sure that healing continues. Patient knows that this can take somewhere between 12-16 weeks. Discussed with patient that we may need to do physical therapy which she has declined.

## 2013-03-26 ENCOUNTER — Other Ambulatory Visit: Payer: Self-pay | Admitting: Internal Medicine

## 2013-04-11 ENCOUNTER — Other Ambulatory Visit: Payer: Self-pay | Admitting: Internal Medicine

## 2013-04-17 ENCOUNTER — Encounter: Payer: Self-pay | Admitting: Internal Medicine

## 2013-04-17 ENCOUNTER — Ambulatory Visit (INDEPENDENT_AMBULATORY_CARE_PROVIDER_SITE_OTHER)
Admission: RE | Admit: 2013-04-17 | Discharge: 2013-04-17 | Disposition: A | Discharge status: Home / Self Care | Payer: Medicare Other | Source: Ambulatory Visit | Attending: Family Medicine | Admitting: Family Medicine

## 2013-04-17 ENCOUNTER — Ambulatory Visit (INDEPENDENT_AMBULATORY_CARE_PROVIDER_SITE_OTHER): Payer: Medicare Other | Admitting: Internal Medicine

## 2013-04-17 ENCOUNTER — Other Ambulatory Visit (INDEPENDENT_AMBULATORY_CARE_PROVIDER_SITE_OTHER): Payer: Medicare Other

## 2013-04-17 VITALS — BP 108/52 | HR 64 | Temp 98.4°F | Resp 16 | Wt 166.0 lb

## 2013-04-17 DIAGNOSIS — M545 Low back pain, unspecified: Secondary | ICD-10-CM

## 2013-04-17 DIAGNOSIS — F329 Major depressive disorder, single episode, unspecified: Secondary | ICD-10-CM

## 2013-04-17 DIAGNOSIS — F411 Generalized anxiety disorder: Secondary | ICD-10-CM

## 2013-04-17 DIAGNOSIS — J449 Chronic obstructive pulmonary disease, unspecified: Secondary | ICD-10-CM

## 2013-04-17 DIAGNOSIS — S92919A Unspecified fracture of unspecified toe(s), initial encounter for closed fracture: Secondary | ICD-10-CM

## 2013-04-17 DIAGNOSIS — S92401D Displaced unspecified fracture of right great toe, subsequent encounter for fracture with routine healing: Secondary | ICD-10-CM

## 2013-04-17 DIAGNOSIS — IMO0001 Reserved for inherently not codable concepts without codable children: Secondary | ICD-10-CM

## 2013-04-17 LAB — URINALYSIS, ROUTINE W REFLEX MICROSCOPIC
Bilirubin Urine: NEGATIVE
Ketones, ur: NEGATIVE
Nitrite: NEGATIVE
Specific Gravity, Urine: 1.025 (ref 1.000–1.030)
Total Protein, Urine: NEGATIVE
Urine Glucose: NEGATIVE
Urobilinogen, UA: 0.2 (ref 0.0–1.0)
pH: 6 (ref 5.0–8.0)

## 2013-04-17 MED ORDER — HYDROCODONE-ACETAMINOPHEN 10-325 MG PO TABS: 1.0000 | ORAL_TABLET | Freq: Four times a day (QID) | ORAL | Status: DC | PRN

## 2013-04-17 NOTE — Assessment & Plan Note (Signed)
Continue with current prescription therapy as reflected on the Med list.  

## 2013-04-17 NOTE — Progress Notes (Signed)
Patient ID: Kaylee Hansen, female   DOB: March 10, 1956, 57 y.o.   MRN: [id number] Patient ID: Kaylee Hansen, female   DOB: 09-Jun-1956, 57 y.o.   MRN: [id number]     HPI  F/u a fall on 8/26, twisted R foot. Saw Dr Katrinka Blazing  Patient does have an intra-articular fracture that is nondisplaced of the first proximal phalanx. This involves approximately 25% of the joint. In a CAM walker boot  F/u COPD, LBP, anxiety, siezures. C/o constipation, cramps C/o HH, food gets stuck in the throat, GERD C/o urinry urgency and frequency  BP Readings from Last 3 Encounters:  04/17/13 108/52  03/12/13 112/60  03/05/13 118/50      Review of Systems  Constitutional: Negative for activity change, appetite change, fatigue and unexpected weight change.  HENT: Negative for congestion, mouth sores, nosebleeds and sinus pressure.   Eyes: Negative for visual disturbance.  Respiratory: Positive for apnea (at night). Negative for chest tightness.   Gastrointestinal: Negative for anal bleeding.  Genitourinary: Negative for difficulty urinating and vaginal pain.  Musculoskeletal: Positive for back pain and gait problem (better).  Skin: Negative for pallor and wound.  Neurological: Negative for dizziness, tremors, weakness and numbness.  Psychiatric/Behavioral: Positive for sleep disturbance and decreased concentration. Negative for suicidal ideas, behavioral problems, confusion, self-injury and agitation. The patient is nervous/anxious.    Wt Readings from Last 3 Encounters:  04/17/13 166 lb (75.297 kg)  03/05/13 163 lb (73.936 kg)  02/18/13 158 lb (71.668 kg)       Objective:   Physical Exam  Constitutional: She appears well-developed. No distress.  Obese, NAD  HENT:  Head: Normocephalic.  Right Ear: External ear normal.  Left Ear: External ear normal.  Nose: Nose normal.  Mouth/Throat: Oropharynx is clear and moist.  Eyes: Conjunctivae are normal. Pupils are equal, round, and reactive to light. Right  eye exhibits no discharge. Left eye exhibits no discharge.  Neck: Normal range of motion. Neck supple. No JVD present. No tracheal deviation present. No thyromegaly present.  Cardiovascular: Normal rate, regular rhythm and normal heart sounds.   Pulmonary/Chest: No stridor. No respiratory distress. She has no wheezes. She has rales. She exhibits tenderness.  B breasts with fibrocystic changes  Abdominal: Soft. Bowel sounds are normal. She exhibits no distension and no mass. There is tenderness. There is no rebound and no guarding.  LLQ is tender  Musculoskeletal: She exhibits tenderness. She exhibits no edema.  Lymphadenopathy:    She has no cervical adenopathy.  Neurological: She displays normal reflexes. No cranial nerve deficit. She exhibits normal muscle tone. Coordination abnormal.  Better - out of a w/c now  Skin: No rash noted. No erythema.  Psychiatric: She has a normal mood and affect. Her behavior is normal. Judgment and thought content normal.  Mole on R chest AKs, SKs In a w/c R ankle and R dorsal foot are less tender- in a Cam Walker   Lab Results  Component Value Date   WBC 7.8 05/08/2012   HGB 12.4 05/08/2012   HCT 38.1 05/08/2012   PLT 295.0 05/08/2012   GLUCOSE 68* 12/02/2012   CHOL 339* 08/12/2009   TRIG 298.0* 08/12/2009   HDL 43.10 08/12/2009   LDLDIRECT 232.7 08/12/2009   LDLCALC  Value: 240        Total Cholesterol/HDL:CHD Risk Coronary Heart Disease Risk Table                     Men   Women  1/2 Average Risk   3.4   3.3  Average Risk       5.0   4.4  2 X Average Risk   9.6   7.1  3 X Average Risk  23.4   11.0        Use the calculated Patient Ratio above and the CHD Risk Table to determine the patient's CHD Risk.        ATP III CLASSIFICATION (LDL):  <100     mg/dL   Optimal  161-096  mg/dL   Near or Above                    Optimal  130-159  mg/dL   Borderline  045-409  mg/dL   High  >811     mg/dL   Very High* 91/47/8295   ALT 11 12/02/2012   AST 15 12/02/2012   NA  144 12/02/2012   K 4.3 12/02/2012   CL 109 12/02/2012   CREATININE 0.8 12/02/2012   BUN 7 12/02/2012   CO2 30 12/02/2012   TSH 1.44 01/18/2012   INR 1.0 09/16/2008   HGBA1C  Value: 5.0 (NOTE)   The ADA recommends the following therapeutic goal for glycemic   control related to Hgb A1C measurement:   Goal of Therapy:   < 7.0% Hgb A1C   Reference: American Diabetes Association: Clinical Practice   Recommendations 2008, Diabetes Care,  2008, 31:(Suppl 1). 09/17/2008        Assessment & Plan:

## 2013-04-17 NOTE — Assessment & Plan Note (Signed)
Cam walker  Dr Katrinka Blazing - appt tomorrow w/xray

## 2013-04-18 ENCOUNTER — Ambulatory Visit: Payer: Medicare Other

## 2013-04-18 ENCOUNTER — Ambulatory Visit: Payer: Medicare Other | Admitting: Internal Medicine

## 2013-04-18 ENCOUNTER — Encounter: Payer: Self-pay | Admitting: Family Medicine

## 2013-04-18 ENCOUNTER — Ambulatory Visit (INDEPENDENT_AMBULATORY_CARE_PROVIDER_SITE_OTHER): Payer: Medicare Other | Admitting: Family Medicine

## 2013-04-18 VITALS — BP 110/48 | HR 81

## 2013-04-18 DIAGNOSIS — R300 Dysuria: Secondary | ICD-10-CM

## 2013-04-18 DIAGNOSIS — IMO0001 Reserved for inherently not codable concepts without codable children: Secondary | ICD-10-CM

## 2013-04-18 DIAGNOSIS — S92401D Displaced unspecified fracture of right great toe, subsequent encounter for fracture with routine healing: Secondary | ICD-10-CM

## 2013-04-18 NOTE — Assessment & Plan Note (Signed)
Patient at this time seems to be complete healing. She is more than welcome to get into a regular shoe. Patient was given a home exercise program to increase range of motion of the ankle. Patient will followup on an as-needed basis.

## 2013-04-18 NOTE — Patient Instructions (Signed)
Very nice to see you You can come out of the boot as tolerated Exercises daily Consider ice baths for pain 1 time a day Come back as needed.

## 2013-04-18 NOTE — Progress Notes (Signed)
  Subjective:    CC: Followup of right first toe intra-articular fracture  HPI: Patient is a 57 year old female following up for her toe fracture. Patient states that she is doing significantly better with only some mild pain from time to time. Patient continue to wear the Cam Walker at all times except for sleeping. Patient denies any new symptoms. Patient is no longer needing to take any pain medicine for this problem specifically.    Past medical history, Surgical history, Family history not pertinant except as noted below, Social history, Allergies, and medications have been entered into the medical record, reviewed, and no changes needed.   Review of Systems: No fevers, chills, night sweats, weight loss, chest pain, or shortness of breath.   Objective:   Blood pressure 110/48, pulse 81, SpO2 92.00%.  General: Well Developed, well nourished, and in no acute distress.  Neuro: Alert and oriented x3, extra-ocular muscles intact, sensation grossly intact.  HEENT: Normocephalic, atraumatic, pupils equal round reactive to light, neck supple, no masses, no lymphadenopathy, thyroid nonpalpable.  Skin: Warm and dry, no rashes. Cardiac:  no lower extremity edema. Respiratory: Not using accessory muscles, speaking in full sentences. Abdominal: NT, soft Gait: Nonantlagic, good balance and coordination Lymphatic: no lymphadenopathy in neck or axillae on palpation, non tender.  Musculoskeletal: Inspection and palpation of the right and left upper extremities including the shoulders elbows and wrist are unremarkable with full range of motion and good muscle strength and tone. Inspection and palpation of the right and left lower extremities including the hips knees and ankles are unremarkable and nontender with full range of motion and good muscle strength and tone and are symmetric. Foot exam shows the patient has no swelling. Patient has no significant abnormality on exam. She is nontender on exam.   There is no pain over the lateral malleolus or medial malleolus. No pain over the navicular bone. She is neurovascularly intact distally.   X-rays were ordered, reviewed and interpreted by me today. X-rays of the right foot shows patient is completely healed at this time with good alignment.  Impression and Recommendations:

## 2013-04-21 LAB — URINE CULTURE: Colony Count: >=100000

## 2013-06-03 ENCOUNTER — Other Ambulatory Visit (INDEPENDENT_AMBULATORY_CARE_PROVIDER_SITE_OTHER): Payer: Medicare Other

## 2013-06-03 ENCOUNTER — Ambulatory Visit (INDEPENDENT_AMBULATORY_CARE_PROVIDER_SITE_OTHER): Payer: Medicare Other | Admitting: Family Medicine

## 2013-06-03 ENCOUNTER — Encounter: Payer: Self-pay | Admitting: Family Medicine

## 2013-06-03 VITALS — BP 120/60 | HR 90

## 2013-06-03 DIAGNOSIS — M7061 Trochanteric bursitis, right hip: Secondary | ICD-10-CM

## 2013-06-03 DIAGNOSIS — M25559 Pain in unspecified hip: Secondary | ICD-10-CM

## 2013-06-03 DIAGNOSIS — M25551 Pain in right hip: Secondary | ICD-10-CM

## 2013-06-03 DIAGNOSIS — M76899 Other specified enthesopathies of unspecified lower limb, excluding foot: Secondary | ICD-10-CM

## 2013-06-03 NOTE — Progress Notes (Signed)
Subjective:    CC: Right hip pain  HPI: Patient is a 57 year old female here for a new problem. Patient is coming in with right lateral hip pain. Patient states that she's had this intermittently for some time it has become constant over the course last week. Patient states of injury. Patient states it is worse with laying on it or with ambulation. Denies radiation down the leg, denies any numbness or weakness. Patient also denies any associated back pain. Patient has tried medications that she has with minimal benefit. Patient was pain is 8/10.  Past medical history, Surgical history, Family history not pertinant except as noted below, Social history, Allergies, and medications have been entered into the medical record, reviewed, and no changes needed.   Review of Systems: No fevers, chills, night sweats, weight loss, chest pain, or shortness of breath.   Objective:   Blood pressure 120/60, pulse 90, SpO2 93.00%.  General: Well Developed, well nourished, and in no acute distress.  Neuro: Alert and oriented x3, extra-ocular muscles intact, sensation grossly intact.  HEENT: Normocephalic, atraumatic, pupils equal round reactive to light, neck supple, no masses, no lymphadenopathy, thyroid nonpalpable.  Skin: Warm and dry, no rashes. Cardiac:  no lower extremity edema. Respiratory: Not using accessory muscles, speaking in full sentences. Abdominal: NT, soft Gait: Nonantlagic, good balance and coordination Lymphatic: no lymphadenopathy in neck or axillae on palpation, non tender.  Musculoskeletal: Inspection and palpation of the right and left upper extremities including the shoulders elbows and wrist are unremarkable with full range of motion and good muscle strength and tone. Inspection and palpation of the right and left lower extremities including the knees and ankles are unremarkable and nontender with full range of motion and good muscle strength and tone and are symmetric. Hip:  Right ROM IR: 35 Deg, ER: 45 Deg, Flexion: 120 Deg, Extension: 100 Deg, Abduction: 45 Deg, Adduction: 45 Deg Strength IR: 4/5, ER: 5/5, Flexion: 5/5, Extension: 5/5, Abduction: 4/5, Adduction: 5/5 Pelvic alignment unremarkable to inspection and palpation. Standing hip rotation and gait without trendelenburg sign / unsteadiness. Greater trochanter with significant tenderness to palpation. No tenderness over piriformis  Positive pain with FABER negative FADIR. No SI joint tenderness and normal minimal SI movement. 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  Hip: Trochanteric bursa does have swelling mild enlargement Acetabular labrum visualized and without tears, displacement, or effusion in joint. Femoral neck appears unremarkable without increased power doppler signal along Cortex.  IMPRESSION:  Greater trochanteric bursitis   Procedure: Real-time Ultrasound Guided Injection of right greater trochanteric bursitis secondary to patient's body habitus Device: GE Logiq E  Ultrasound guided injection is preferred based studies that show increased duration, increased effect, greater accuracy, decreased procedural pain, increased response rate, and decreased cost with ultrasound guided versus blind injection.  Verbal informed consent obtained.  Time-out conducted.  Noted no overlying erythema, induration, or other signs of local infection.  Skin prepped in a sterile fashion.  Local anesthesia: Topical Ethyl chloride.  With sterile technique and under real time ultrasound guidance:  Greater trochanteric area was visualized and patient's bursa was noted. A 22-gauge 3 inch needle was inserted and 4 cc of 0.5% Marcaine and 1 cc of Kenalog 40 mg/dL was injected. Pictures taken Completed without difficulty  Pain immediately resolved suggesting accurate placement of the medication.  Advised to call if  fevers/chills, erythema, induration, drainage, or persistent bleeding.  Images permanently stored and available for review in the ultrasound unit.  Impression: Technically successful ultrasound guided injection.  Impression and Recommendations:

## 2013-06-03 NOTE — Assessment & Plan Note (Signed)
Injection as described above. Patient tolerated the procedure very well. Home exercise program given Discuss over-the-counter medications that can be beneficial Patient will come back again in 3-4 weeks if continuing to have pain.

## 2013-06-03 NOTE — Patient Instructions (Signed)
Good to see you  Do exercises in 2 days but be careful.  Come back in 4 weeks if not completely better.  Ice 20 minutes can be helpful.

## 2013-06-09 ENCOUNTER — Encounter (HOSPITAL_COMMUNITY): Payer: Self-pay | Admitting: Internal Medicine

## 2013-06-09 ENCOUNTER — Emergency Department (HOSPITAL_COMMUNITY): Payer: Medicare Other

## 2013-06-09 ENCOUNTER — Inpatient Hospital Stay (HOSPITAL_COMMUNITY)
Admission: EM | Admit: 2013-06-09 | Discharge: 2013-06-12 | DRG: 194 | Disposition: A | Discharge status: Home / Self Care | Payer: Medicare Other | Attending: Internal Medicine | Admitting: Internal Medicine

## 2013-06-09 DIAGNOSIS — Z833 Family history of diabetes mellitus: Secondary | ICD-10-CM

## 2013-06-09 DIAGNOSIS — G4734 Idiopathic sleep related nonobstructive alveolar hypoventilation: Secondary | ICD-10-CM

## 2013-06-09 DIAGNOSIS — C3490 Malignant neoplasm of unspecified part of unspecified bronchus or lung: Secondary | ICD-10-CM

## 2013-06-09 DIAGNOSIS — R51 Headache: Secondary | ICD-10-CM

## 2013-06-09 DIAGNOSIS — K5900 Constipation, unspecified: Secondary | ICD-10-CM

## 2013-06-09 DIAGNOSIS — G43511 Persistent migraine aura without cerebral infarction, intractable, with status migrainosus: Secondary | ICD-10-CM

## 2013-06-09 DIAGNOSIS — Z8719 Personal history of other diseases of the digestive system: Secondary | ICD-10-CM

## 2013-06-09 DIAGNOSIS — K222 Esophageal obstruction: Secondary | ICD-10-CM

## 2013-06-09 DIAGNOSIS — Z720 Tobacco use: Secondary | ICD-10-CM

## 2013-06-09 DIAGNOSIS — Z860100 Personal history of colon polyps, unspecified: Secondary | ICD-10-CM

## 2013-06-09 DIAGNOSIS — F17200 Nicotine dependence, unspecified, uncomplicated: Secondary | ICD-10-CM

## 2013-06-09 DIAGNOSIS — I69920 Aphasia following unspecified cerebrovascular disease: Secondary | ICD-10-CM

## 2013-06-09 DIAGNOSIS — R1013 Epigastric pain: Secondary | ICD-10-CM

## 2013-06-09 DIAGNOSIS — Z85118 Personal history of other malignant neoplasm of bronchus and lung: Secondary | ICD-10-CM

## 2013-06-09 DIAGNOSIS — R079 Chest pain, unspecified: Secondary | ICD-10-CM

## 2013-06-09 DIAGNOSIS — R933 Abnormal findings on diagnostic imaging of other parts of digestive tract: Secondary | ICD-10-CM

## 2013-06-09 DIAGNOSIS — M545 Low back pain, unspecified: Secondary | ICD-10-CM

## 2013-06-09 DIAGNOSIS — F3289 Other specified depressive episodes: Secondary | ICD-10-CM

## 2013-06-09 DIAGNOSIS — R5381 Other malaise: Secondary | ICD-10-CM

## 2013-06-09 DIAGNOSIS — D485 Neoplasm of uncertain behavior of skin: Secondary | ICD-10-CM

## 2013-06-09 DIAGNOSIS — N12 Tubulo-interstitial nephritis, not specified as acute or chronic: Secondary | ICD-10-CM

## 2013-06-09 DIAGNOSIS — J4489 Other specified chronic obstructive pulmonary disease: Secondary | ICD-10-CM

## 2013-06-09 DIAGNOSIS — R269 Unspecified abnormalities of gait and mobility: Secondary | ICD-10-CM

## 2013-06-09 DIAGNOSIS — Z87898 Personal history of other specified conditions: Secondary | ICD-10-CM

## 2013-06-09 DIAGNOSIS — K5289 Other specified noninfective gastroenteritis and colitis: Secondary | ICD-10-CM

## 2013-06-09 DIAGNOSIS — R0789 Other chest pain: Secondary | ICD-10-CM

## 2013-06-09 DIAGNOSIS — F17210 Nicotine dependence, cigarettes, uncomplicated: Secondary | ICD-10-CM | POA: Diagnosis present

## 2013-06-09 DIAGNOSIS — Z8249 Family history of ischemic heart disease and other diseases of the circulatory system: Secondary | ICD-10-CM

## 2013-06-09 DIAGNOSIS — I10 Essential (primary) hypertension: Secondary | ICD-10-CM

## 2013-06-09 DIAGNOSIS — R404 Transient alteration of awareness: Secondary | ICD-10-CM

## 2013-06-09 DIAGNOSIS — D214 Benign neoplasm of connective and other soft tissue of abdomen: Secondary | ICD-10-CM

## 2013-06-09 DIAGNOSIS — M7061 Trochanteric bursitis, right hip: Secondary | ICD-10-CM

## 2013-06-09 DIAGNOSIS — R300 Dysuria: Secondary | ICD-10-CM

## 2013-06-09 DIAGNOSIS — F329 Major depressive disorder, single episode, unspecified: Secondary | ICD-10-CM

## 2013-06-09 DIAGNOSIS — R109 Unspecified abdominal pain: Secondary | ICD-10-CM

## 2013-06-09 DIAGNOSIS — G43909 Migraine, unspecified, not intractable, without status migrainosus: Secondary | ICD-10-CM

## 2013-06-09 DIAGNOSIS — Z96649 Presence of unspecified artificial hip joint: Secondary | ICD-10-CM

## 2013-06-09 DIAGNOSIS — K219 Gastro-esophageal reflux disease without esophagitis: Secondary | ICD-10-CM

## 2013-06-09 DIAGNOSIS — Z8673 Personal history of transient ischemic attack (TIA), and cerebral infarction without residual deficits: Secondary | ICD-10-CM

## 2013-06-09 DIAGNOSIS — M359 Systemic involvement of connective tissue, unspecified: Secondary | ICD-10-CM

## 2013-06-09 DIAGNOSIS — M79671 Pain in right foot: Secondary | ICD-10-CM

## 2013-06-09 DIAGNOSIS — E2749 Other adrenocortical insufficiency: Secondary | ICD-10-CM

## 2013-06-09 DIAGNOSIS — R1310 Dysphagia, unspecified: Secondary | ICD-10-CM

## 2013-06-09 DIAGNOSIS — E785 Hyperlipidemia, unspecified: Secondary | ICD-10-CM

## 2013-06-09 DIAGNOSIS — G40909 Epilepsy, unspecified, not intractable, without status epilepticus: Secondary | ICD-10-CM | POA: Diagnosis present

## 2013-06-09 DIAGNOSIS — L304 Erythema intertrigo: Secondary | ICD-10-CM

## 2013-06-09 DIAGNOSIS — E559 Vitamin D deficiency, unspecified: Secondary | ICD-10-CM

## 2013-06-09 DIAGNOSIS — R569 Unspecified convulsions: Secondary | ICD-10-CM

## 2013-06-09 DIAGNOSIS — R141 Gas pain: Secondary | ICD-10-CM

## 2013-06-09 DIAGNOSIS — F411 Generalized anxiety disorder: Secondary | ICD-10-CM

## 2013-06-09 DIAGNOSIS — J449 Chronic obstructive pulmonary disease, unspecified: Secondary | ICD-10-CM | POA: Diagnosis present

## 2013-06-09 DIAGNOSIS — R1084 Generalized abdominal pain: Secondary | ICD-10-CM

## 2013-06-09 DIAGNOSIS — M79609 Pain in unspecified limb: Secondary | ICD-10-CM

## 2013-06-09 DIAGNOSIS — G4700 Insomnia, unspecified: Secondary | ICD-10-CM

## 2013-06-09 DIAGNOSIS — M1990 Unspecified osteoarthritis, unspecified site: Secondary | ICD-10-CM

## 2013-06-09 DIAGNOSIS — E039 Hypothyroidism, unspecified: Secondary | ICD-10-CM

## 2013-06-09 DIAGNOSIS — Z8679 Personal history of other diseases of the circulatory system: Secondary | ICD-10-CM

## 2013-06-09 DIAGNOSIS — J189 Pneumonia, unspecified organism: Principal | ICD-10-CM

## 2013-06-09 DIAGNOSIS — Z880 Allergy status to penicillin: Secondary | ICD-10-CM

## 2013-06-09 LAB — CBC
HCT: 39.8 % (ref 36.0–46.0)
Hemoglobin: 13.2 g/dL (ref 12.0–15.0)
MCH: 32.2 pg (ref 26.0–34.0)
MCHC: 33.2 g/dL (ref 30.0–36.0)
MCV: 97.1 fL (ref 78.0–100.0)
Platelets: 291 10*3/uL (ref 150–400)
RBC: 4.1 MIL/uL (ref 3.87–5.11)
RDW: 13.4 % (ref 11.5–15.5)
WBC: 17.1 10*3/uL — ABNORMAL HIGH (ref 4.0–10.5)

## 2013-06-09 LAB — BASIC METABOLIC PANEL
BUN: 18 mg/dL (ref 6–23)
CO2: 25 mEq/L (ref 19–32)
Calcium: 9.9 mg/dL (ref 8.4–10.5)
Chloride: 98 mEq/L (ref 96–112)
Creatinine, Ser: 0.83 mg/dL (ref 0.50–1.10)
GFR calc Af Amer: 88 mL/min — ABNORMAL LOW (ref >90)
GFR calc non Af Amer: 76 mL/min — ABNORMAL LOW (ref >90)
Glucose, Bld: 104 mg/dL — ABNORMAL HIGH (ref 70–99)
Potassium: 3.9 mEq/L (ref 3.5–5.1)
Sodium: 134 mEq/L — ABNORMAL LOW (ref 135–145)

## 2013-06-09 LAB — CG4 I-STAT (LACTIC ACID): Lactic Acid, Venous: 1.71 mmol/L (ref 0.5–2.2)

## 2013-06-09 LAB — POCT I-STAT TROPONIN I: Troponin i, poc: 0 ng/mL (ref 0.00–0.08)

## 2013-06-09 MED ORDER — FENTANYL CITRATE 0.05 MG/ML IJ SOLN
50.0000 ug | Freq: Once | INTRAMUSCULAR | Status: AC
  Administered 2013-06-09: 50 ug via INTRAVENOUS
  Filled 2013-06-09: qty 2

## 2013-06-09 MED ORDER — IPRATROPIUM BROMIDE 0.02 % IN SOLN
0.5000 mg | Freq: Once | RESPIRATORY_TRACT | Status: AC
  Administered 2013-06-09: 0.5 mg via RESPIRATORY_TRACT
  Filled 2013-06-09: qty 2.5

## 2013-06-09 MED ORDER — DEXTROSE 5 % IV SOLN: 500.0000 mg | Freq: Once | INTRAVENOUS | Status: DC

## 2013-06-09 MED ORDER — MORPHINE SULFATE 4 MG/ML IJ SOLN: 4.0000 mg | Freq: Once | INTRAMUSCULAR | Status: DC

## 2013-06-09 MED ORDER — ALBUTEROL SULFATE (5 MG/ML) 0.5% IN NEBU
5.0000 mg | INHALATION_SOLUTION | Freq: Once | RESPIRATORY_TRACT | Status: AC
  Administered 2013-06-09: 5 mg via RESPIRATORY_TRACT
  Filled 2013-06-09: qty 1

## 2013-06-09 MED ORDER — LEVOFLOXACIN IN D5W 750 MG/150ML IV SOLN
750.0000 mg | Freq: Once | INTRAVENOUS | Status: AC
  Administered 2013-06-09: 750 mg via INTRAVENOUS
  Filled 2013-06-09: qty 150

## 2013-06-09 MED ORDER — LEVOFLOXACIN IN D5W 750 MG/150ML IV SOLN: 750.0000 mg | Freq: Once | INTRAVENOUS | Status: DC

## 2013-06-09 NOTE — ED Notes (Signed)
Pt has had a cough x 2 days. Sharp CP. Worse w/ coughing/deep breath. Green sputum. Hx of Lung CA. Alert and oriented. Lives at home w/ husband. EKG stable per EMS. Duo neb given en route.

## 2013-06-09 NOTE — ED Notes (Signed)
IV team at bedside 

## 2013-06-09 NOTE — H&P (Signed)
[facility] History and Physical  KERSTEN SALMONS WUJ:[id number] DOB: 02/02/1956 DOA: 06/09/2013  Referring physician: ER physician. PCP: Sonda Primes, MD   Chief Complaint: Shortness of breath.  HPI: Kaylee Hansen is a 57 y.o. female with history of COPD ongoing tobacco abuse and previous history of lung cancer presents to the ER because of worsening shortness of breath and productive cough over last one week. Patient also had subjective feeling of fever and chills. Patient has been having greenish sputum on coughing. In the ER chest x-ray shows possibility of left sided pneumonia. Patient states that over the last 2 days patient also has been having some retrosternal chest discomfort. Patient has been admitted for further management. Patient is on IV Levaquin for pneumonia. Patient denies any nausea vomiting abdominal pain diarrhea.   Review of Systems: As presented in the history of presenting illness, rest negative.  Past Medical History  Diagnosis Date  . Hx of cancer of lung 1999  . History of cholecystectomy   . Hx of appendectomy   . Hx of hysterectomy   . COPD (chronic obstructive pulmonary disease)   . Low back pain   . Osteoarthritis of knee     bilateral knee  . Seizures   . Hypothyroidism   . Anxiety   . Depression   . Stroke     CVA, hx of 97  . Hyperlipidemia   . Collagenous colitis   . Esophageal ulcer     04/2009 EGD  . Esophageal ulcer 04/2009  . Helicobacter pylori gastritis 2010    Pylera Tx  . Constipation     Chronic abdominal pain and constipation  . Colon adenomas 2011  . Diverticulosis   . GERD (gastroesophageal reflux disease)   . Esophageal stricture 11/08/2012   Past Surgical History  Procedure Laterality Date  . Appendectomy    . Abdominal hysterectomy      complete 1992  . Cholecystectomy    . Total hip arthroplasty      Left  . Abdominal surgery      Exploratory  . Esophagogastroduodenoscopy      w/baloon x 2  .  Colonoscopy w/ biopsies      multiple  . Esophagogastroduodenoscopy N/A 11/08/2012    Procedure: ESOPHAGOGASTRODUODENOSCOPY (EGD);  Surgeon: Iva Boop, MD;  Location: [facility];  Service: Endoscopy;  Laterality: N/A;  . Balloon dilation N/A 11/08/2012    Procedure: BALLOON DILATION;  Surgeon: Iva Boop, MD;  Location: [facility];  Service: Endoscopy;  Laterality: N/A;   Social History:  reports that she has been smoking Cigarettes.  She has been smoking about 0.50 packs per day. She has never used smokeless tobacco. She reports that she does not drink alcohol or use illicit drugs. Where does patient live home. Can patient participate in ADLs? Yes.  Allergies  Allergen Reactions  . Atorvastatin     Unknown.   . Morphine     "Makes me go crazy."   . Oxycodone Hcl     Unknown.   [name] Penicillins Hives    Family History:  Family History  Problem Relation Age of Onset  . Coronary artery disease Mother   . Diabetes Mother   . Diabetes Son   . Colon cancer Neg Hx     colon  . Coronary artery disease Other     grandmother, grandfather  . Kidney disease Other     aunt  . Hypertension Father       Prior  to Admission medications   Medication Sig Start Date End Date Taking? Authorizing Provider  ALPRAZolam (XANAX) 0.25 MG tablet Take 1 tablet (0.25 mg total) by mouth 3 (three) times daily as needed for sleep or anxiety. Take one tablet by mouth 2-3 times daily as needed for anxiety   (3 months supply) 01/17/13  Yes Georgina Quint Plotnikov, MD  calcitRIOL (ROCALTROL) 0.25 MCG capsule Take 1 capsule (0.25 mcg total) by mouth daily. 10/02/12  Yes Tresa Garter, MD  cholecalciferol (VITAMIN D) 1000 UNITS tablet Take 1,000 Units by mouth daily. 2 po qd 11/18/10  Yes Georgina Quint Plotnikov, MD  cycloSPORINE (RESTASIS) 0.05 % ophthalmic emulsion Place 1 drop into both eyes 2 (two) times daily. 07/03/12  Yes Georgina Quint Plotnikov, MD  fentaNYL (DURAGESIC - DOSED MCG/HR) 12 MCG/HR Place  12.5 mcg onto the skin every 3 (three) days.   Yes Historical Provider, MD  gabapentin (NEURONTIN) 100 MG capsule Take 1 capsule (100 mg total) by mouth 3 (three) times daily. 3 months supply 10/02/12  Yes Tresa Garter, MD  HYDROcodone-acetaminophen (NORCO) 10-325 MG per tablet Take 1 tablet by mouth every 6 (six) hours as needed for moderate pain. Please fill on or after 06/19/13 04/17/13  Yes Georgina Quint Plotnikov, MD  ketoconazole (NIZORAL) 2 % cream APPLY TOPICALLY DAILY. 04/11/13  Yes Georgina Quint Plotnikov, MD  levothyroxine (SYNTHROID, LEVOTHROID) 150 MCG tablet Take 1 tablet (150 mcg total) by mouth daily. 09/12/12  Yes Tresa Garter, MD  NON FORMULARY Oxygen- use at night as directed    Yes Historical Provider, MD  pantoprazole (PROTONIX) 40 MG tablet Take 40 mg by mouth daily.   Yes Historical Provider, MD  PARoxetine (PAXIL) 40 MG tablet Take 1 tablet (40 mg total) by mouth daily. 03/05/13  Yes Georgina Quint Plotnikov, MD  phenytoin (DILANTIN) 100 MG ER capsule Take 100-200 mg by mouth 2 (two) times daily. 100 mg every morning and 200 mg every night. 03/05/13  Yes Georgina Quint Plotnikov, MD  polyethylene glycol (MIRALAX / GLYCOLAX) packet Take 17 g by mouth daily as needed for mild constipation.   Yes Historical Provider, MD  promethazine (PHENERGAN) 25 MG tablet Take 25 mg by mouth daily as needed. 01/18/12  Yes Newt Lukes, MD  traZODone (DESYREL) 100 MG tablet Take 200 mg by mouth at bedtime. 10/02/12  Yes Georgina Quint Plotnikov, MD  zolmitriptan (ZOMIG-ZMT) 5 MG disintegrating tablet Take 5 mg by mouth as needed for migraine. 02/18/13  Yes Georgina Quint Plotnikov, MD  albuterol (PROVENTIL HFA;VENTOLIN HFA) 108 (90 BASE) MCG/ACT inhaler Inhale 2 puffs into the lungs every 6 (six) hours as needed for wheezing. 11/03/11 11/02/12  Tresa Garter, MD    Physical Exam: Filed Vitals:   06/09/13 1733 06/09/13 1734 06/09/13 2141  BP: 126/40  126/52  Pulse: 88  88  Temp: 98.3 F (36.8 C)     Resp: 20  22  SpO2: 99% 95% 99%     General:  Well-developed and nourished.  Eyes: Anicteric no pallor.  ENT: No discharge from ears eyes nose mouth.  Neck: No mass felt.  Cardiovascular: S1-S2 heard.  Respiratory: Mild expiratory wheeze heard no crepitations.  Abdomen: Soft nontender bowel sounds present.  Skin: No rash.  Musculoskeletal: No edema.  Psychiatric: Appears normal.  Neurologic: Alert awake oriented to time place and person. Moves all extremities.  Labs on Admission:  Basic Metabolic Panel:  Recent Labs Lab 06/09/13 2040  NA 134*  K 3.9  CL 98  CO2 25  GLUCOSE 104*  BUN 18  CREATININE 0.83  CALCIUM 9.9   Liver Function Tests: No results found for this basename: AST, ALT, ALKPHOS, BILITOT, PROT, ALBUMIN,  in the last 168 hours No results found for this basename: LIPASE, AMYLASE,  in the last 168 hours No results found for this basename: AMMONIA,  in the last 168 hours CBC:  Recent Labs Lab 06/09/13 2040  WBC 17.1*  HGB 13.2  HCT 39.8  MCV 97.1  PLT 291   Cardiac Enzymes: No results found for this basename: CKTOTAL, CKMB, CKMBINDEX, TROPONINI,  in the last 168 hours  BNP (last 3 results) No results found for this basename: PROBNP,  in the last 8760 hours CBG: No results found for this basename: GLUCAP,  in the last 168 hours  Radiological Exams on Admission: Dg Chest 2 View  06/09/2013   CLINICAL DATA:  Cough, chest pain, shortness of breath, history of COPD, smoking and lung cancer  EXAM: CHEST  2 VIEW  COMPARISON:  09/12/2012; CT abdomen pelvis -05/21/2012  FINDINGS: Grossly unchanged borderline enlarged cardiac silhouette and mediastinal contours with atherosclerotic plaque within a tortuous and possibly ectatic thoracic aorta. Overall improved aeration of the lungs with resolution of previously noted small bilateral effusions. The lungs appear hyperexpanded with flattening of the bilateral mid diaphragms mild diffuse slightly  nodular thickening of the pulmonary interstitium. Interval development of an apparent nodular airspace opacity within the mid/lower left lower lung. No pleural effusion or pneumothorax. Grossly unchanged bones. Postcholecystectomy.  IMPRESSION: 1. Lung hyperexpansion with development of an apparent nodular airspace opacity within the left mid/lower lung, possibly an area of developing infection. A follow-up chest radiograph in 4 to 6 weeks after treatment is recommended to ensure resolution. 2. Overall improved aeration of the lungs with resolution of previously noted bilateral pleural effusions.   Electronically Signed   By: Simonne Come M.D.   On: 06/09/2013 18:37    EKG: Independently reviewed. Normal sinus rhythm with LVH changes. Q waves in inferolateral leads.  Assessment/Plan Principal Problem:   Pneumonia Active Problems:   HYPOTHYROIDISM   SMOKER   COPD   SEIZURE DISORDER   LUNG CANCER, HX OF   Chest pain   1. Pneumonia - at this time patient has been placed on IV Levaquin to treat for community-acquired pneumonia. I have ordered urine strep antigen and Legionella antigen. I have also ordered influenza PCR. I have independently check patient's chest x-ray. 2. Chest discomfort - cycle cardiac markers. Presently chest pain-free. I have independently check patient's EKG 3. History of lung cancer - I have ordered CT chest with contrast to check for any definite findings of recurrence of lung cancer. 4. COPD - continue with inhalers. 5. Seizure disorder - I have ordered Dilantin levels. Continue Dilantin. 6. Hypothyroidism - continue Synthroid. 7. Tobacco abuse - I have advised patient to quit smoking.    Code Status: Full code.  Family Communication: None.  Disposition Plan: Admit to inpatient.    KAKRAKANDY,ARSHAD N. [facility] Pager [phone].  If 7PM-7AM, please contact night-coverage [email or web address] Password [facility] 06/09/2013, 11:21 PM

## 2013-06-09 NOTE — ED Notes (Signed)
IV team called. Unable to obtain labs/iv at this time.

## 2013-06-09 NOTE — ED Notes (Signed)
IV team nurse remains at bedside.

## 2013-06-09 NOTE — ED Notes (Signed)
Robyn PA stated that it would be acceptable to do foot stick for blood/ IV access as pt did not want EJ.

## 2013-06-09 NOTE — ED Provider Notes (Signed)
Medical screening examination/treatment/procedure(s) were conducted as a shared visit with non-physician practitioner(s) and myself.  I personally evaluated the patient during the encounter.  EKG Interpretation    Date/Time:  Monday June 09 2013 20:02:44 EST Ventricular Rate:  87 PR Interval:  155 QRS Duration: 90 QT Interval:  354 QTC Calculation: 426 R Axis:   20 Text Interpretation:  Sinus rhythm Left ventricular hypertrophy Anterolateral infarct, age indeterminate Similar to previous Confirmed by Gwendolyn Grant  MD, Egan Berkheimer ([id number]) on 06/09/2013 8:27:49 PM             35F presents with abdominal pain, jaundice. Recently finished abdominal radiation for hepatocellular CA. Vomiting, decreased PO for past 4 days. General malaise also. AFVSS here. Alleged hypotension with standing previously in clinic. Having new abdominal distention also.  Belly with diffuse tenderness. Labs with elevated lactate, could be from liver disease. Cultures obtained. UTI found on labs. Bilirubin extremely elevated. Broad spectrum antibiotics ordered. Patient admitted to medicine.   Dagmar Hait, MD 06/09/13 [phone]

## 2013-06-09 NOTE — ED Provider Notes (Signed)
CSN: [id number]     Arrival date & time 06/09/13  1729 History   First MD Initiated Contact with Patient 06/09/13 1730     Chief Complaint  Patient presents with  . Cough   (Consider location/radiation/quality/duration/timing/severity/associated sxs/prior Treatment) HPI Comments: Pt is a 57 y/o female with an extensive PMHx including lung cancer and COPD who presents to the emergency department complaining of cough x1 week. Patient states she always has a cough with her history of lung cancer, however over the past week the cough has become productive with green sputum. She has a sharp pain in the Center of her chest when coughing or taking a deep breath. Admits to associated SOB. Denies fever, chills, abdominal pain, nausea or vomiting. She was given a DuoNeb en route to the ED which helped alleviate some of her symptoms. She is on 2.5 L of oxygen at home. Daily smoker.  Patient is a 57 y.o. female presenting with cough. The history is provided by the patient.  Cough Associated symptoms: shortness of breath     Past Medical History  Diagnosis Date  . Hx of cancer of lung 1999  . History of cholecystectomy   . Hx of appendectomy   . Hx of hysterectomy   . COPD (chronic obstructive pulmonary disease)   . Low back pain   . Osteoarthritis of knee     bilateral knee  . Seizures   . Hypothyroidism   . Anxiety   . Depression   . Stroke     CVA, hx of 97  . Hyperlipidemia   . Collagenous colitis   . Esophageal ulcer     04/2009 EGD  . Esophageal ulcer 04/2009  . Helicobacter pylori gastritis 2010    Pylera Tx  . Constipation     Chronic abdominal pain and constipation  . Colon adenomas 2011  . Diverticulosis   . GERD (gastroesophageal reflux disease)   . Esophageal stricture 11/08/2012   Past Surgical History  Procedure Laterality Date  . Appendectomy    . Abdominal hysterectomy      complete 1992  . Cholecystectomy    . Total hip arthroplasty      Left  . Abdominal  surgery      Exploratory  . Esophagogastroduodenoscopy      w/baloon x 2  . Colonoscopy w/ biopsies      multiple  . Esophagogastroduodenoscopy N/A 11/08/2012    Procedure: ESOPHAGOGASTRODUODENOSCOPY (EGD);  Surgeon: Iva Boop, MD;  Location: [facility];  Service: Endoscopy;  Laterality: N/A;  . Balloon dilation N/A 11/08/2012    Procedure: BALLOON DILATION;  Surgeon: Iva Boop, MD;  Location: [facility];  Service: Endoscopy;  Laterality: N/A;   Family History  Problem Relation Age of Onset  . Coronary artery disease Mother   . Diabetes Mother   . Diabetes Son   . Colon cancer Neg Hx     colon  . Coronary artery disease Other     grandmother, grandfather  . Kidney disease Other     aunt  . Hypertension Father    History  Substance Use Topics  . Smoking status: Current Every Day Smoker -- 0.50 packs/day    Types: Cigarettes  . Smokeless tobacco: Never Used  . Alcohol Use: No   OB History   Grav Para Term Preterm Abortions TAB SAB Ect Mult Living                 Review of Systems  Respiratory:  Positive for cough, chest tightness and shortness of breath.   All other systems reviewed and are negative.    Allergies  Atorvastatin; Morphine; Oxycodone hcl; and Penicillins  Home Medications   Current Outpatient Rx  Name  Route  Sig  Dispense  Refill  . ALPRAZolam (XANAX) 0.25 MG tablet   Oral   Take 1 tablet (0.25 mg total) by mouth 3 (three) times daily as needed for sleep or anxiety. Take one tablet by mouth 2-3 times daily as needed for anxiety   (3 months supply)   270 tablet   2   . calcitRIOL (ROCALTROL) 0.25 MCG capsule   Oral   Take 1 capsule (0.25 mcg total) by mouth daily.   90 capsule   3   . cholecalciferol (VITAMIN D) 1000 UNITS tablet   Oral   Take 1,000 Units by mouth daily. 2 po qd         . cycloSPORINE (RESTASIS) 0.05 % ophthalmic emulsion   Both Eyes   Place 1 drop into both eyes 2 (two) times daily.   0.4 mL   0   .  fentaNYL (DURAGESIC - DOSED MCG/HR) 12 MCG/HR   Transdermal   Place 12.5 mcg onto the skin every 3 (three) days.         [name] gabapentin (NEURONTIN) 100 MG capsule   Oral   Take 1 capsule (100 mg total) by mouth 3 (three) times daily. 3 months supply   270 capsule   3   . HYDROcodone-acetaminophen (NORCO) 10-325 MG per tablet   Oral   Take 1 tablet by mouth every 6 (six) hours as needed for moderate pain. Please fill on or after 06/19/13         . ketoconazole (NIZORAL) 2 % cream      APPLY TOPICALLY DAILY.   45 g   1   . levothyroxine (SYNTHROID, LEVOTHROID) 150 MCG tablet   Oral   Take 1 tablet (150 mcg total) by mouth daily.   90 tablet   3   . NON FORMULARY      Oxygen- use at night as directed          . pantoprazole (PROTONIX) 40 MG tablet   Oral   Take 40 mg by mouth daily.         [name] PARoxetine (PAXIL) 40 MG tablet   Oral   Take 1 tablet (40 mg total) by mouth daily.   90 tablet   3   . phenytoin (DILANTIN) 100 MG ER capsule   Oral   Take 100-200 mg by mouth 2 (two) times daily. 100 mg every morning and 200 mg every night.         . polyethylene glycol (MIRALAX / GLYCOLAX) packet   Oral   Take 17 g by mouth daily as needed for mild constipation.         . promethazine (PHENERGAN) 25 MG tablet   Oral   Take 25 mg by mouth daily as needed.         . traZODone (DESYREL) 100 MG tablet   Oral   Take 200 mg by mouth at bedtime.         [name] zolmitriptan (ZOMIG-ZMT) 5 MG disintegrating tablet   Oral   Take 5 mg by mouth as needed for migraine.         [name] EXPIRED: albuterol (PROVENTIL HFA;VENTOLIN HFA) 108 (90 BASE) MCG/ACT inhaler   Inhalation   Inhale 2 puffs  into the lungs every 6 (six) hours as needed for wheezing.   1 Inhaler   5    BP 126/52  Pulse 88  Temp(Src) 98.3 F (36.8 C)  Resp 22  SpO2 99% Physical Exam  Nursing note and vitals reviewed. Constitutional: She is oriented to person, place, and time. She appears  well-developed and well-nourished. No distress. Nasal cannula in place.  HENT:  Head: Normocephalic and atraumatic.  Mouth/Throat: Oropharynx is clear and moist.  Eyes: Conjunctivae are normal.  Neck: Normal range of motion. Neck supple.  Cardiovascular: Normal rate, regular rhythm and normal heart sounds.   Pulmonary/Chest: Effort normal and breath sounds normal.  Labored breathing. Scattered rhonchi. Harsh, wet cough with green sputum noted.  Abdominal: Soft. Bowel sounds are normal. There is no tenderness.  Musculoskeletal: Normal range of motion. She exhibits no edema.  Neurological: She is alert and oriented to person, place, and time.  Skin: Skin is warm and dry. She is not diaphoretic.  Psychiatric: She has a normal mood and affect. Her behavior is normal.    ED Course  Procedures (including critical care time)    Labs Review Labs Reviewed  CBC - Abnormal; Notable for the following:    WBC 17.1 (*)    All other components within normal limits  BASIC METABOLIC PANEL - Abnormal; Notable for the following:    Sodium 134 (*)    Glucose, Bld 104 (*)    GFR calc non Af Amer 76 (*)    GFR calc Af Amer 88 (*)    All other components within normal limits  CULTURE, BLOOD (ROUTINE X 2)  CULTURE, BLOOD (ROUTINE X 2)  POCT I-STAT TROPONIN I   Imaging Review Dg Chest 2 View  06/09/2013   CLINICAL DATA:  Cough, chest pain, shortness of breath, history of COPD, smoking and lung cancer  EXAM: CHEST  2 VIEW  COMPARISON:  09/12/2012; CT abdomen pelvis -05/21/2012  FINDINGS: Grossly unchanged borderline enlarged cardiac silhouette and mediastinal contours with atherosclerotic plaque within a tortuous and possibly ectatic thoracic aorta. Overall improved aeration of the lungs with resolution of previously noted small bilateral effusions. The lungs appear hyperexpanded with flattening of the bilateral mid diaphragms mild diffuse slightly nodular thickening of the pulmonary interstitium.  Interval development of an apparent nodular airspace opacity within the mid/lower left lower lung. No pleural effusion or pneumothorax. Grossly unchanged bones. Postcholecystectomy.  IMPRESSION: 1. Lung hyperexpansion with development of an apparent nodular airspace opacity within the left mid/lower lung, possibly an area of developing infection. A follow-up chest radiograph in 4 to 6 weeks after treatment is recommended to ensure resolution. 2. Overall improved aeration of the lungs with resolution of previously noted bilateral pleural effusions.   Electronically Signed   By: Simonne Come M.D.   On: 06/09/2013 18:37    EKG Interpretation    Date/Time:  Monday June 09 2013 20:02:44 EST Ventricular Rate:  87 PR Interval:  155 QRS Duration: 90 QT Interval:  354 QTC Calculation: 426 R Axis:   20 Text Interpretation:  Sinus rhythm Left ventricular hypertrophy Anterolateral infarct, age indeterminate Similar to previous Confirmed by Gwendolyn Grant  MD, BLAIR ([id number]) on 06/09/2013 8:27:49 PM            MDM   1. CAP (community acquired pneumonia)     Patient presenting with cough, productive of green sputum. She is well appearing and in no apparent distress, vital signs stable. Labs, CXR pending. Concern for pneumonia vs bronchitis.  O2 sat 100% on 2 L oxygen, on 2.5 L at home. 10:06 PM Chest x-ray nodular airspace opacity within the left mid/lower lung, possibly developing pneumonia. Leukocytosis of 17.1. Blood cultures. IV Levaquin started. Port score 79, risk class 3, based on clinical appearance and judgment will be admitted. Admission accepted by Dr. Toniann Fail, [facility]. Case discussed with attending Dr. Gwendolyn Grant who also evaluated patient and agrees with plan of care.  Trevor Mace, PA-C 06/09/13 2209

## 2013-06-09 NOTE — ED Notes (Signed)
Bed: OZ30 Expected date: 06/09/13 Expected time: 5:17 PM Means of arrival:  Comments: Cancer patient cough

## 2013-06-09 NOTE — ED Notes (Signed)
IV team unable to get IV. Stated veins may not hold up to IV antibiotics. Main lab called for lab stick. Robyn PA notified that pt may need EJ.

## 2013-06-10 ENCOUNTER — Inpatient Hospital Stay (HOSPITAL_COMMUNITY): Payer: Medicare Other

## 2013-06-10 ENCOUNTER — Encounter (HOSPITAL_COMMUNITY): Payer: Self-pay | Admitting: Radiology

## 2013-06-10 LAB — BASIC METABOLIC PANEL
BUN: 17 mg/dL (ref 6–23)
BUN: 15 mg/dL (ref 6–23)
CO2: 27 mEq/L (ref 19–32)
CO2: 27 mEq/L (ref 19–32)
Calcium: 9.4 mg/dL (ref 8.4–10.5)
Calcium: 9.8 mg/dL (ref 8.4–10.5)
Chloride: 104 mEq/L (ref 96–112)
Chloride: 106 mEq/L (ref 96–112)
Creatinine, Ser: 0.82 mg/dL (ref 0.50–1.10)
Creatinine, Ser: 0.77 mg/dL (ref 0.50–1.10)
GFR calc Af Amer: 89 mL/min — ABNORMAL LOW (ref >90)
GFR calc Af Amer: >90 mL/min (ref >90)
GFR calc non Af Amer: 77 mL/min — ABNORMAL LOW (ref >90)
GFR calc non Af Amer: >90 mL/min (ref >90)
Glucose, Bld: 106 mg/dL — ABNORMAL HIGH (ref 70–99)
Glucose, Bld: 89 mg/dL (ref 70–99)
Potassium: 5.1 mEq/L (ref 3.5–5.1)
Potassium: 4.7 mEq/L (ref 3.5–5.1)
Sodium: 137 mEq/L (ref 135–145)
Sodium: 140 mEq/L (ref 135–145)

## 2013-06-10 LAB — CBC
HCT: 34.1 % — ABNORMAL LOW (ref 36.0–46.0)
Hemoglobin: 11.3 g/dL — ABNORMAL LOW (ref 12.0–15.0)
MCH: 32.3 pg (ref 26.0–34.0)
MCHC: 33.1 g/dL (ref 30.0–36.0)
MCV: 97.4 fL (ref 78.0–100.0)
Platelets: 258 10*3/uL (ref 150–400)
RBC: 3.5 MIL/uL — ABNORMAL LOW (ref 3.87–5.11)
RDW: 13.5 % (ref 11.5–15.5)
WBC: 10.9 10*3/uL — ABNORMAL HIGH (ref 4.0–10.5)

## 2013-06-10 LAB — INFLUENZA PANEL BY PCR (TYPE A & B)
H1N1 flu by pcr: NOT DETECTED
Influenza A By PCR: NEGATIVE
Influenza B By PCR: NEGATIVE

## 2013-06-10 LAB — TROPONIN I
Troponin I: <0.3 ng/mL (ref <0.30)
Troponin I: <0.3 ng/mL (ref <0.30)
Troponin I: <0.3 ng/mL (ref <0.30)

## 2013-06-10 LAB — STREP PNEUMONIAE URINARY ANTIGEN: Strep Pneumo Urinary Antigen: NEGATIVE

## 2013-06-10 LAB — PHENYTOIN LEVEL, TOTAL: Phenytoin Lvl: 9.9 ug/mL — ABNORMAL LOW (ref 10.0–20.0)

## 2013-06-10 MED ORDER — ALBUTEROL SULFATE (5 MG/ML) 0.5% IN NEBU
2.5000 mg | INHALATION_SOLUTION | Freq: Four times a day (QID) | RESPIRATORY_TRACT | Status: DC
  Administered 2013-06-10: 2.5 mg via RESPIRATORY_TRACT
  Filled 2013-06-10: qty 0.5

## 2013-06-10 MED ORDER — IPRATROPIUM BROMIDE 0.02 % IN SOLN
0.5000 mg | Freq: Four times a day (QID) | RESPIRATORY_TRACT | Status: DC
  Administered 2013-06-10 – 2013-06-12 (×8): 0.5 mg via RESPIRATORY_TRACT
  Filled 2013-06-10 (×8): qty 2.5

## 2013-06-10 MED ORDER — VARENICLINE TARTRATE 0.5 MG PO TABS
0.5000 mg | ORAL_TABLET | Freq: Two times a day (BID) | ORAL | Status: DC
  Administered 2013-06-10 – 2013-06-12 (×5): 0.5 mg via ORAL
  Filled 2013-06-10 (×6): qty 1

## 2013-06-10 MED ORDER — ACETAMINOPHEN 650 MG RE SUPP: 650.0000 mg | Freq: Four times a day (QID) | RECTAL | Status: DC | PRN

## 2013-06-10 MED ORDER — GABAPENTIN 100 MG PO CAPS
100.0000 mg | ORAL_CAPSULE | Freq: Three times a day (TID) | ORAL | Status: DC
  Administered 2013-06-10 – 2013-06-12 (×7): 100 mg via ORAL
  Filled 2013-06-10 (×9): qty 1

## 2013-06-10 MED ORDER — FENTANYL 12 MCG/HR TD PT72
12.5000 ug | MEDICATED_PATCH | TRANSDERMAL | Status: DC
  Administered 2013-06-10: 12.5 ug via TRANSDERMAL
  Filled 2013-06-10: qty 1

## 2013-06-10 MED ORDER — PHENYTOIN SODIUM EXTENDED 100 MG PO CAPS
200.0000 mg | ORAL_CAPSULE | Freq: Every day | ORAL | Status: DC
  Administered 2013-06-10 – 2013-06-11 (×3): 200 mg via ORAL
  Filled 2013-06-10 (×4): qty 2

## 2013-06-10 MED ORDER — HYDROCODONE-ACETAMINOPHEN 10-325 MG PO TABS
1.0000 | ORAL_TABLET | Freq: Four times a day (QID) | ORAL | Status: DC | PRN
  Administered 2013-06-10 – 2013-06-12 (×7): 1 via ORAL
  Filled 2013-06-10 (×7): qty 1

## 2013-06-10 MED ORDER — POLYETHYLENE GLYCOL 3350 17 G PO PACK
17.0000 g | PACK | Freq: Every day | ORAL | Status: DC | PRN
  Filled 2013-06-10: qty 1

## 2013-06-10 MED ORDER — SUMATRIPTAN SUCCINATE 100 MG PO TABS
100.0000 mg | ORAL_TABLET | ORAL | Status: DC | PRN
  Administered 2013-06-10: 100 mg via ORAL
  Filled 2013-06-10 (×2): qty 1

## 2013-06-10 MED ORDER — BUDESONIDE 0.25 MG/2ML IN SUSP
0.2500 mg | Freq: Two times a day (BID) | RESPIRATORY_TRACT | Status: DC
  Administered 2013-06-10 – 2013-06-12 (×5): 0.25 mg via RESPIRATORY_TRACT
  Filled 2013-06-10 (×13): qty 2

## 2013-06-10 MED ORDER — ONDANSETRON HCL 4 MG PO TABS: 4.0000 mg | ORAL_TABLET | Freq: Four times a day (QID) | ORAL | Status: DC | PRN

## 2013-06-10 MED ORDER — ALBUTEROL SULFATE (5 MG/ML) 0.5% IN NEBU
2.5000 mg | INHALATION_SOLUTION | Freq: Four times a day (QID) | RESPIRATORY_TRACT | Status: DC
  Administered 2013-06-10 – 2013-06-12 (×8): 2.5 mg via RESPIRATORY_TRACT
  Filled 2013-06-10 (×9): qty 0.5

## 2013-06-10 MED ORDER — PROMETHAZINE HCL 25 MG PO TABS: 25.0000 mg | ORAL_TABLET | Freq: Every day | ORAL | Status: DC | PRN

## 2013-06-10 MED ORDER — CALCITRIOL 0.25 MCG PO CAPS
0.2500 ug | ORAL_CAPSULE | Freq: Every day | ORAL | Status: DC
  Administered 2013-06-10 – 2013-06-12 (×3): 0.25 ug via ORAL
  Filled 2013-06-10 (×3): qty 1

## 2013-06-10 MED ORDER — TRAZODONE HCL 100 MG PO TABS
200.0000 mg | ORAL_TABLET | Freq: Every day | ORAL | Status: DC
  Administered 2013-06-10 – 2013-06-11 (×3): 200 mg via ORAL
  Filled 2013-06-10 (×4): qty 2

## 2013-06-10 MED ORDER — TRAZODONE HCL 100 MG PO TABS: 100.0000 mg | ORAL_TABLET | Freq: Every day | ORAL | Status: DC

## 2013-06-10 MED ORDER — ALPRAZOLAM 0.25 MG PO TABS
0.2500 mg | ORAL_TABLET | Freq: Three times a day (TID) | ORAL | Status: DC | PRN
  Administered 2013-06-10 – 2013-06-11 (×4): 0.25 mg via ORAL
  Filled 2013-06-10 (×4): qty 1

## 2013-06-10 MED ORDER — ALBUTEROL SULFATE HFA 108 (90 BASE) MCG/ACT IN AERS
2.0000 | INHALATION_SPRAY | Freq: Four times a day (QID) | RESPIRATORY_TRACT | Status: DC | PRN
  Filled 2013-06-10: qty 6.7

## 2013-06-10 MED ORDER — ALBUTEROL SULFATE (5 MG/ML) 0.5% IN NEBU: 2.5000 mg | INHALATION_SOLUTION | RESPIRATORY_TRACT | Status: DC | PRN

## 2013-06-10 MED ORDER — PHENYTOIN SODIUM EXTENDED 100 MG PO CAPS
100.0000 mg | ORAL_CAPSULE | Freq: Every day | ORAL | Status: DC
  Administered 2013-06-10 – 2013-06-12 (×3): 100 mg via ORAL
  Filled 2013-06-10 (×3): qty 1

## 2013-06-10 MED ORDER — IOHEXOL 300 MG/ML  SOLN
80.0000 mL | Freq: Once | INTRAMUSCULAR | Status: AC | PRN
  Administered 2013-06-10: 80 mL via INTRAVENOUS

## 2013-06-10 MED ORDER — LEVOTHYROXINE SODIUM 150 MCG PO TABS
150.0000 ug | ORAL_TABLET | Freq: Every day | ORAL | Status: DC
  Administered 2013-06-10 – 2013-06-12 (×3): 150 ug via ORAL
  Filled 2013-06-10 (×4): qty 1

## 2013-06-10 MED ORDER — ZOLMITRIPTAN 5 MG PO TBDP: 5.0000 mg | ORAL_TABLET | ORAL | Status: DC | PRN

## 2013-06-10 MED ORDER — IPRATROPIUM BROMIDE 0.02 % IN SOLN
0.5000 mg | Freq: Four times a day (QID) | RESPIRATORY_TRACT | Status: DC
  Administered 2013-06-10: 0.5 mg via RESPIRATORY_TRACT
  Filled 2013-06-10: qty 2.5

## 2013-06-10 MED ORDER — VITAMIN D3 25 MCG (1000 UNIT) PO TABS
1000.0000 [IU] | ORAL_TABLET | Freq: Every day | ORAL | Status: DC
  Administered 2013-06-10 – 2013-06-12 (×3): 1000 [IU] via ORAL
  Filled 2013-06-10 (×3): qty 1

## 2013-06-10 MED ORDER — PANTOPRAZOLE SODIUM 40 MG PO TBEC
40.0000 mg | DELAYED_RELEASE_TABLET | Freq: Every day | ORAL | Status: DC
  Administered 2013-06-10 – 2013-06-12 (×3): 40 mg via ORAL
  Filled 2013-06-10 (×3): qty 1

## 2013-06-10 MED ORDER — ONDANSETRON HCL 4 MG/2ML IJ SOLN
4.0000 mg | Freq: Four times a day (QID) | INTRAMUSCULAR | Status: DC | PRN
  Administered 2013-06-10 – 2013-06-11 (×2): 4 mg via INTRAVENOUS
  Filled 2013-06-10 (×2): qty 2

## 2013-06-10 MED ORDER — SODIUM CHLORIDE 0.9 % IJ SOLN: 3.0000 mL | Freq: Two times a day (BID) | INTRAMUSCULAR | Status: DC

## 2013-06-10 MED ORDER — CYCLOSPORINE 0.05 % OP EMUL
1.0000 [drp] | Freq: Two times a day (BID) | OPHTHALMIC | Status: DC
  Administered 2013-06-10 – 2013-06-12 (×5): 1 [drp] via OPHTHALMIC
  Filled 2013-06-10 (×7): qty 1

## 2013-06-10 MED ORDER — ACETAMINOPHEN 325 MG PO TABS
650.0000 mg | ORAL_TABLET | Freq: Four times a day (QID) | ORAL | Status: DC | PRN
  Filled 2013-06-10: qty 2

## 2013-06-10 MED ORDER — LEVOFLOXACIN IN D5W 750 MG/150ML IV SOLN
750.0000 mg | Freq: Every day | INTRAVENOUS | Status: DC
  Filled 2013-06-10: qty 150

## 2013-06-10 MED ORDER — PAROXETINE HCL 20 MG PO TABS
40.0000 mg | ORAL_TABLET | Freq: Every day | ORAL | Status: DC
  Administered 2013-06-10 – 2013-06-12 (×3): 40 mg via ORAL
  Filled 2013-06-10 (×3): qty 2

## 2013-06-10 MED ORDER — ENOXAPARIN SODIUM 40 MG/0.4ML ~~LOC~~ SOLN
40.0000 mg | SUBCUTANEOUS | Status: DC
  Administered 2013-06-10 – 2013-06-12 (×3): 40 mg via SUBCUTANEOUS
  Filled 2013-06-10 (×3): qty 0.4

## 2013-06-10 NOTE — Care Management Note (Signed)
    Page 1 of 1   06/10/2013     11:26:13 AM   CARE MANAGEMENT NOTE 06/10/2013  Patient:  Kaylee Hansen, Kaylee Hansen   Account Number:  [id number]  Date Initiated:  06/10/2013  Documentation initiated by:  Lorenda Ishihara  Subjective/Objective Assessment:   57 yo female admitted with SOB, PNA. PTA lived at home with spouse.     Action/Plan:   Home when stable   Anticipated DC Date:  06/12/2013   Anticipated DC Plan:  HOME/SELF CARE      DC Planning Services  CM consult      Choice offered to / List presented to:             Status of service:  Completed, signed off Medicare Important Message given?   (If response is "NO", the following Medicare IM given date fields will be blank) Date Medicare IM given:   Date Additional Medicare IM given:    Discharge Disposition:  HOME/SELF CARE  Per UR Regulation:  Reviewed for med. necessity/level of care/duration of stay  If discussed at Long Length of Stay Meetings, dates discussed:    Comments:

## 2013-06-10 NOTE — Progress Notes (Signed)
Kaylee Hansen:[id number] DOB: 1955/10/12 DOA: 06/09/2013 PCP: Sonda Primes, MD  Brief narrative: 57 year old ?, known history lung cancer 1999 [, STILL SMOKER] history dysphagia also is October 2010, right eyelid neoplasm, hypothyroidism, chronic low back pain, history left hip avascular necrosis 2007,? Collagenous colitis, history CVA 1997, long standing COPD, seizure disorder on medication, total hip replacement, hysterectomy 1992, cholecystectomy admitted 06/09/2013 with shortness of breath and some mild chest pain CT angiogram negative for PE/recurrence of lung cancer   Past medical history-As per Problem list Chart reviewed as below- Reviewed  Consultants:  None  Procedures:  CT scan chest  Antibiotics:  Levaquin 12/15   Subjective  No chest pain currently coughing a lot Not sure if patient will quit smoking. Has been smoking three-quarter pack a day for a long time Stay she doesn't think she can afford nicotine patches of Chantix This was contrasted with her smoking habit   Objective    Interim History: None  Telemetry: None  Objective: Filed Vitals:   06/09/13 2141 06/09/13 2350 06/10/13 0034 06/10/13 0600  BP: 126/52 136/76  128/68  Pulse: 88 82  73  Temp:  98.6 F (37 C)  98.1 F (36.7 C)  TempSrc:  Oral  Oral  Resp: 22 20  18   Height:  5\' 5"  (1.651 m)    Weight:  72.5 kg (159 lb 13.3 oz)    SpO2: 99% 100% 96% 99%    Intake/Output Summary (Last 24 hours) at 06/10/13 1142 Last data filed at 06/10/13 0400  Gross per 24 hour  Intake      0 ml  Output    350 ml  Net   -350 ml    Exam:  General: EOMI, frail, anxious, slightly disheveled and curious affect-has some word finding difficulty WHICH IS NOT NEW Cardiovascular: S1-S2 no murmur rub or gallop Respiratory: Clear at present Abdomen: Soft nontender nondistended Skin no lower extremity edema Neuro intact  Data Reviewed: Basic Metabolic Panel:  Recent Labs Lab 06/09/13 2040  06/10/13 0554  NA 134* 137  K 3.9 5.1  CL 98 104  CO2 25 27  GLUCOSE 104* 106*  BUN 18 17  CREATININE 0.83 0.82  CALCIUM 9.9 9.4   Liver Function Tests: No results found for this basename: AST, ALT, ALKPHOS, BILITOT, PROT, ALBUMIN,  in the last 168 hours No results found for this basename: LIPASE, AMYLASE,  in the last 168 hours No results found for this basename: AMMONIA,  in the last 168 hours CBC:  Recent Labs Lab 06/09/13 2040 06/10/13 0554  WBC 17.1* 10.9*  HGB 13.2 11.3*  HCT 39.8 34.1*  MCV 97.1 97.4  PLT 291 258   Cardiac Enzymes:  Recent Labs Lab 06/10/13 0045 06/10/13 0554  TROPONINI <0.30 <0.30   BNP: No components found with this basename: POCBNP,  CBG: No results found for this basename: GLUCAP,  in the last 168 hours  No results found for this or any previous visit (from the past 240 hour(s)).   Studies:              All Imaging reviewed and is as per above notation   Scheduled Meds: . albuterol  2.5 mg Nebulization Q6H WA  . budesonide (PULMICORT) nebulizer solution  0.25 mg Nebulization BID  . calcitRIOL  0.25 mcg Oral Daily  . cholecalciferol  1,000 Units Oral Daily  . cycloSPORINE  1 drop Both Eyes BID  . enoxaparin (LOVENOX) injection  40 mg Subcutaneous  Q24H  . fentaNYL  12.5 mcg Transdermal Q72H  . gabapentin  100 mg Oral TID  . ipratropium  0.5 mg Nebulization Q6H WA  . levofloxacin (LEVAQUIN) IV  750 mg Intravenous QHS  . levothyroxine  150 mcg Oral QAC breakfast  . pantoprazole  40 mg Oral Daily  . PARoxetine  40 mg Oral Daily  . phenytoin  100 mg Oral Daily  . phenytoin  200 mg Oral QHS  . sodium chloride  3 mL Intravenous Q12H  . sodium chloride  3 mL Intravenous Q12H  . traZODone  200 mg Oral QHS   Continuous Infusions:    Assessment/Plan: 1. Moderate to severe COPD/increasing, gold stage 3-4-get 6 minute walk test, continue albuterol every 6 when necessary while awake, ipratropium every 6 when necessary while awake, add  delay her and steroid burst prednisone 60--strongly counseled regarding cessation of tobacco-as she is not producing purulent sputum, per her Gold guidelines does not require antibiotics 2. Significant tobacco abuse-start Chantix in hospital would discontinue if she has any psychiatric disturbances 3. History of reflux, collagenous colitis-stable at present outpatient GI follow-continue pantoprazole 40 daily 4. Dysnomia with history of CVA 1997-unclear why not on Plavix or aspirin. Start aspirin 81 mg at least-if intolerance, then discontinue 5. Chronic pain-continue Duragesic 12.5 every 3 days, gabapentin 100 3 times a day, Percocet on 10/325 every 6 hourly-suggest outpatient pain physician 6. Bipolar-continue Xanax 1 3 times a day, Paxil 40 daily, trazodone 200 each bedtime 7. History seizures, last seizure a while back-continue Dilantin 100 a.m., 200 p.m. 8. History migraines-continue Zomig 5 mg when necessary 9. Hypothyroidism continue levothyroxine 150 q. 8  Code Status: Full, heparin, pantoprazole Family Communication: Discussed with husband at bedside Disposition Plan: Inpatient   Pleas Koch, MD  [facility] Pager [phone] 06/10/2013, 11:42 AM    LOS: 1 day

## 2013-06-10 NOTE — Progress Notes (Signed)
ANTIBIOTIC CONSULT NOTE - INITIAL  Pharmacy Consult for levofloxacin Indication: pneumonia  Allergies  Allergen Reactions  . Atorvastatin     Unknown.   . Morphine     "Makes me go crazy."   . Oxycodone Hcl     Unknown.   [name] Penicillins Hives    Patient Measurements: Height: 5\' 5"  (165.1 cm) Weight: 159 lb 13.3 oz (72.5 kg) IBW/kg (Calculated) : 57 Adjusted Body Weight:   Vital Signs: Temp: 98.6 F (37 C) (12/15 2350) Temp src: Oral (12/15 2350) BP: 136/76 mmHg (12/15 2350) Pulse Rate: 82 (12/15 2350) Intake/Output from previous day:   Intake/Output from this shift:    Labs:  Recent Labs  06/09/13 2040  WBC 17.1*  HGB 13.2  PLT 291  CREATININE 0.83   Estimated Creatinine Clearance: 72.8 ml/min (by C-G formula based on Cr of 0.83). No results found for this basename: VANCOTROUGH, VANCOPEAK, VANCORANDOM, GENTTROUGH, GENTPEAK, GENTRANDOM, TOBRATROUGH, TOBRAPEAK, TOBRARND, AMIKACINPEAK, AMIKACINTROU, AMIKACIN,  in the last 72 hours   Microbiology: No results found for this or any previous visit (from the past 720 hour(s)).  Medical History: Past Medical History  Diagnosis Date  . Hx of cancer of lung 1999  . History of cholecystectomy   . Hx of appendectomy   . Hx of hysterectomy   . COPD (chronic obstructive pulmonary disease)   . Low back pain   . Osteoarthritis of knee     bilateral knee  . Seizures   . Hypothyroidism   . Anxiety   . Depression   . Stroke     CVA, hx of 97  . Hyperlipidemia   . Collagenous colitis   . Esophageal ulcer     04/2009 EGD  . Esophageal ulcer 04/2009  . Helicobacter pylori gastritis 2010    Pylera Tx  . Constipation     Chronic abdominal pain and constipation  . Colon adenomas 2011  . Diverticulosis   . GERD (gastroesophageal reflux disease)   . Esophageal stricture 11/08/2012    Medications:  Anti-infectives   Start     Dose/Rate Route Frequency Ordered Stop   06/09/13 1930  levofloxacin (LEVAQUIN) IVPB 750  mg  Status:  Discontinued     750 mg 100 mL/hr over 90 Minutes Intravenous  Once 06/09/13 1915 06/09/13 1916   06/09/13 1930  levofloxacin (LEVAQUIN) IVPB 750 mg     750 mg 100 mL/hr over 90 Minutes Intravenous  Once 06/09/13 1916 06/09/13 2331   06/09/13 1900  azithromycin (ZITHROMAX) 500 mg in dextrose 5 % 250 mL IVPB  Status:  Discontinued     500 mg 250 mL/hr over 60 Minutes Intravenous  Once 06/09/13 1850 06/09/13 1916     Assessment: Patien with PNA. First dose of antibiotics already given.  Goal of Therapy:  Levofloxacin dosed based on patient weight and renal function  Plan:  Follow up culture results Levofloxacin 750mg  iv q24hr       Darlina Guys, Jacquenette Shone Crowford 06/10/2013,2:14 AM

## 2013-06-11 ENCOUNTER — Inpatient Hospital Stay (HOSPITAL_COMMUNITY): Payer: Medicare Other

## 2013-06-11 DIAGNOSIS — F329 Major depressive disorder, single episode, unspecified: Secondary | ICD-10-CM

## 2013-06-11 DIAGNOSIS — R0789 Other chest pain: Secondary | ICD-10-CM

## 2013-06-11 DIAGNOSIS — I10 Essential (primary) hypertension: Secondary | ICD-10-CM

## 2013-06-11 LAB — LEGIONELLA ANTIGEN, URINE: Legionella Antigen, Urine: NEGATIVE

## 2013-06-11 NOTE — Progress Notes (Signed)
Pt completed 6 min walk test in hallway with assist and no oxygen.  Her O2 sats ranged from 96-98% on RA while ambulating.  However, she did have to sit down mid walk due to being tired and her foot hurting slightly at her IV site.

## 2013-06-11 NOTE — Progress Notes (Signed)
[facility] PROGRESS NOTE  Kaylee Hansen ZOX:[id number] DOB: [birth year] DOA: 06/09/2013 PCP: Sonda Primes, MD  Assessment/Plan: Moderate to severe COPD/increasing, gold stage 3-4 -6 minute walk test--no oxygen desaturation  -continue albuterol every 6 when necessary while awake, ipratropium every 6 when necessary while awake, --strongly counseled regarding cessation of tobacco-as she is not producing purulent sputum, per her Gold guidelines does not require antibiotics -CT chest negative for infiltrates which is scattered groundglass opacities -Monitor off of antibiotic Atypical Chest pain -troponins neg -EKG neg for ischemia -V/Q scan -cxr Significant tobacco abuse -start Chantix in hospital would discontinue if she has any psychiatric disturbances History of reflux, collagenous colitis -stable at present outpatient GI follow-continue pantoprazole 40 daily  Dysnomia with history of CVA 1997-unclear why not on Plavix or aspirin. -Start aspirin 81 mg at least-if intolerance, then discontinue  Chronic pain --continue Duragesic 12.5 every 3 days, gabapentin 100 3 times a day, -Percocet on 10/325 every 6 hourly-suggest outpatient pain physician  Bipolar -continue Xanax 0.25mg  3 times a day, Paxil 40 daily, trazodone 200 each bedtime History seizures -last seizure a while back-continue Dilantin 100 a.m., 200 p.m.  History migraines -continue Zomig 5 mg when necessary  Hypothyroidism  -continue levothyroxine daily  Family Communication:   husband at beside Disposition Plan:   Home 12/18       Procedures/Studies: Dg Chest 2 View  06/09/2013   CLINICAL DATA:  Cough, chest pain, shortness of breath, history of COPD, smoking and lung cancer  EXAM: CHEST  2 VIEW  COMPARISON:  09/12/2012; CT abdomen pelvis -05/21/2012  FINDINGS: Grossly unchanged borderline enlarged cardiac silhouette and mediastinal contours with atherosclerotic plaque within a tortuous and possibly  ectatic thoracic aorta. Overall improved aeration of the lungs with resolution of previously noted small bilateral effusions. The lungs appear hyperexpanded with flattening of the bilateral mid diaphragms mild diffuse slightly nodular thickening of the pulmonary interstitium. Interval development of an apparent nodular airspace opacity within the mid/lower left lower lung. No pleural effusion or pneumothorax. Grossly unchanged bones. Postcholecystectomy.  IMPRESSION: 1. Lung hyperexpansion with development of an apparent nodular airspace opacity within the left mid/lower lung, possibly an area of developing infection. A follow-up chest radiograph in 4 to 6 weeks after treatment is recommended to ensure resolution. 2. Overall improved aeration of the lungs with resolution of previously noted bilateral pleural effusions.   Electronically Signed   By: Simonne Come M.D.   On: 06/09/2013 18:37   Ct Chest W Contrast  06/10/2013   CLINICAL DATA:  Progressive shortness of breath with productive cough. Possible pneumonia on recent radiographs. History of lung cancer.  EXAM: CT CHEST WITH CONTRAST  TECHNIQUE: Multidetector CT imaging of the chest was performed during intravenous contrast administration.  CONTRAST:  80mL OMNIPAQUE IOHEXOL 300 MG/ML  SOLN  COMPARISON:  Chest radiographs 09/12/2012 and 06/09/2013. Abdominal CT 05/21/2012. No previous chest CT available.  FINDINGS: No enlarged mediastinal, hilar or axillary lymph nodes are identified. There is a 6 mm right periaortic node on image 22. There is scattered atherosclerosis of the aorta, great vessels and coronary arteries. No significant pleural or pericardial effusion is present.  There are probable radiation changes medially in the right upper lobe. Underlying emphysema is noted with numerous scattered nonspecific small pulmonary nodules. A 6 mm focal ground-glass opacity is present in the left upper lobe on image 13. A less well-defined ground-glass opacities  noted more superiorly in the left upper lobe on image 11. No dominant mass,  endobronchial lesion or confluent airspace opacity is identified. Specifically, there is no focal abnormality in left lower lobe to correspond with the radiographic density. I believe this is due to a healing fracture of the left 8th rib. Several other old rib fractures are noted bilaterally.  The visualized upper abdomen has a stable appearance following cholecystectomy. The visualized left renal cysts are unchanged. There is no adrenal mass or worrisome osseous finding.  IMPRESSION: 1. No evidence of pneumonia or other acute process. 2. No specific features of recurrent lung cancer or metastatic disease. There is chronic lung disease with emphysema and scattered nonspecific small pulmonary nodules bilaterally. There are presumed radiation changes medially in the right upper lobe. Given the patient's history of lung cancer, if comparison studies remain unavailable, follow-up chest CT in 4-6 months suggested to assess stability. 3. Scattered atherosclerosis. 4. Old rib fractures bilaterally. One of these on the left is felt to account for the suggested recent radiographic finding.   Electronically Signed   By: Roxy Horseman M.D.   On: 06/10/2013 09:36   [place] Extrem Low Right Ltd  06/03/2013   [facility] US performed of: Right hip  This study was ordered, performed, and interpreted by Terrilee Files D.O.  Hip:  Trochanteric bursa does have swelling mild enlargement  Acetabular labrum visualized and without tears, displacement, or effusion  in joint.  Femoral neck appears unremarkable without increased power doppler signal  along Cortex.   06/03/2013   Greater trochanteric bursitis     Procedure: Real-time Ultrasound Guided Injection of right greater  trochanteric bursitis secondary to patient's body habitus  Device: GE Logiq E  Ultrasound guided injection is preferred based studies that show increased  duration, increased effect, greater accuracy,  decreased procedural pain,  increased response rate, and decreased cost with ultrasound guided versus  blind injection.  Verbal informed consent obtained.  Time-out conducted.  Noted no overlying erythema, induration, or other signs of local  infection.  Skin prepped in a sterile fashion.  Local anesthesia: Topical Ethyl chloride.  With sterile technique and under real time ultrasound guidance: Greater  trochanteric area was visualized and patient's bursa was noted. A 22-gauge  3 inch needle was inserted and 4 cc of 0.5% Marcaine and 1 cc of Kenalog  40 mg/dL was injected. Pictures taken  Completed without difficulty  Pain immediately resolved suggesting accurate placement of the medication.   Advised to call if fevers/chills, erythema, induration, drainage, or  persistent bleeding.  Images permanently stored and available for review in the ultrasound unit.   Impression: Technically successful ultrasound guided injection.         Subjective:  patient denies fevers, chills, nausea, vomiting, diarrhea. She is breathing better overall. She complains of intermittent chest discomfort. No diarrhea or dysuria. No rashes. No headaches.  Objective: Filed Vitals:   06/11/13 0217 06/11/13 0443 06/11/13 0750 06/11/13 1326  BP:  131/47  122/35  Pulse:  72  91  Temp:  98.1 F (36.7 C)  98.1 F (36.7 C)  TempSrc:  Oral  Oral  Resp:  18  18  Height:      Weight:      SpO2: 93% 100% 100% 98%    Intake/Output Summary (Last 24 hours) at 06/11/13 1830 Last data filed at 06/11/13 0000  Gross per 24 hour  Intake    120 ml  Output      0 ml  Net    120 ml   Weight change:  Exam:  General:  Pt is alert, follows commands appropriately, not in acute distress  HEENT: No icterus, No thrush, [place]/AT  Cardiovascular: RRR, S1/S2, no rubs, no gallops  Respiratory: CTA bilaterally, no wheezing, no crackles, no rhonchi  Abdomen: Soft/+BS, non tender, non distended, no guarding  Extremities: trace edema,  No lymphangitis, No petechiae, No rashes, no synovitis  Data Reviewed: Basic Metabolic Panel:  Recent Labs Lab 06/09/13 2040 06/10/13 0554 06/10/13 1211  NA 134* 137 140  K 3.9 5.1 4.7  CL 98 104 106  CO2 25 27 27   GLUCOSE 104* 106* 89  BUN 18 17 15   CREATININE 0.83 0.82 0.77  CALCIUM 9.9 9.4 9.8   Liver Function Tests: No results found for this basename: AST, ALT, ALKPHOS, BILITOT, PROT, ALBUMIN,  in the last 168 hours No results found for this basename: LIPASE, AMYLASE,  in the last 168 hours No results found for this basename: AMMONIA,  in the last 168 hours CBC:  Recent Labs Lab 06/09/13 2040 06/10/13 0554  WBC 17.1* 10.9*  HGB 13.2 11.3*  HCT 39.8 34.1*  MCV 97.1 97.4  PLT 291 258   Cardiac Enzymes:  Recent Labs Lab 06/10/13 0045 06/10/13 0554 06/10/13 1211  TROPONINI <0.30 <0.30 <0.30   BNP: No components found with this basename: POCBNP,  CBG: No results found for this basename: GLUCAP,  in the last 168 hours  Recent Results (from the past 240 hour(s))  CULTURE, BLOOD (ROUTINE X 2)     Status: None   Collection Time    06/09/13  8:40 PM      Result Value Range Status   Specimen Description BLOOD LEFT HAND   Final   Special Requests BOTTLES DRAWN AEROBIC ONLY   Final   Culture  Setup Time     Final   Value: 06/10/2013 01:46     Performed at [facility]   Culture     Final   Value:        BLOOD CULTURE RECEIVED NO GROWTH TO DATE CULTURE WILL BE HELD FOR 5 DAYS BEFORE ISSUING A FINAL NEGATIVE REPORT     Performed at [facility]   Report Status PENDING   Incomplete  CULTURE, BLOOD (ROUTINE X 2)     Status: None   Collection Time    06/09/13  9:10 PM      Result Value Range Status   Specimen Description BLOOD LEFT FOOT   Final   Special Requests BOTTLES DRAWN AEROBIC AND ANAEROBIC    Final   Culture  Setup Time     Final   Value: 06/10/2013 01:47     Performed at [facility]   Culture     Final   Value:         BLOOD CULTURE RECEIVED NO GROWTH TO DATE CULTURE WILL BE HELD FOR 5 DAYS BEFORE ISSUING A FINAL NEGATIVE REPORT     Performed at [facility]   Report Status PENDING   Incomplete     Scheduled Meds: . albuterol  2.5 mg Nebulization Q6H WA  . budesonide (PULMICORT) nebulizer solution  0.25 mg Nebulization BID  . calcitRIOL  0.25 mcg Oral Daily  . cholecalciferol  1,000 Units Oral Daily  . cycloSPORINE  1 drop Both Eyes BID  . enoxaparin (LOVENOX) injection  40 mg Subcutaneous Q24H  . fentaNYL  12.5 mcg Transdermal Q72H  . gabapentin  100 mg Oral TID  . ipratropium  0.5 mg Nebulization  Q6H WA  . levothyroxine  150 mcg Oral QAC breakfast  . pantoprazole  40 mg Oral Daily  . PARoxetine  40 mg Oral Daily  . phenytoin  100 mg Oral Daily  . phenytoin  200 mg Oral QHS  . sodium chloride  3 mL Intravenous Q12H  . sodium chloride  3 mL Intravenous Q12H  . traZODone  200 mg Oral QHS  . varenicline  0.5 mg Oral BID   Continuous Infusions:    Valeta Paz, DO  [facility] Pager [phone]  If 7PM-7AM, please contact night-coverage [email or web address] Password [email or web address] 06/11/2013, 6:30 PM   LOS: 2 days

## 2013-06-12 ENCOUNTER — Inpatient Hospital Stay (HOSPITAL_COMMUNITY): Payer: Medicare Other

## 2013-06-12 DIAGNOSIS — F17200 Nicotine dependence, unspecified, uncomplicated: Secondary | ICD-10-CM

## 2013-06-12 DIAGNOSIS — C3490 Malignant neoplasm of unspecified part of unspecified bronchus or lung: Secondary | ICD-10-CM

## 2013-06-12 LAB — CBC
HCT: 36.1 % (ref 36.0–46.0)
Hemoglobin: 11.7 g/dL — ABNORMAL LOW (ref 12.0–15.0)
MCH: 32.3 pg (ref 26.0–34.0)
MCHC: 32.4 g/dL (ref 30.0–36.0)
MCV: 99.7 fL (ref 78.0–100.0)
Platelets: 265 10*3/uL (ref 150–400)
RBC: 3.62 MIL/uL — ABNORMAL LOW (ref 3.87–5.11)
RDW: 13.8 % (ref 11.5–15.5)
WBC: 10.4 10*3/uL (ref 4.0–10.5)

## 2013-06-12 LAB — BASIC METABOLIC PANEL
BUN: 16 mg/dL (ref 6–23)
CO2: 24 mEq/L (ref 19–32)
Calcium: 9.6 mg/dL (ref 8.4–10.5)
Chloride: 103 mEq/L (ref 96–112)
Creatinine, Ser: 0.79 mg/dL (ref 0.50–1.10)
GFR calc Af Amer: >90 mL/min (ref >90)
GFR calc non Af Amer: 89 mL/min — ABNORMAL LOW (ref >90)
Glucose, Bld: 110 mg/dL — ABNORMAL HIGH (ref 70–99)
Potassium: 4.8 mEq/L (ref 3.5–5.1)
Sodium: 136 mEq/L (ref 135–145)

## 2013-06-12 LAB — MAGNESIUM: Magnesium: 2.1 mg/dL (ref 1.5–2.5)

## 2013-06-12 MED ORDER — TECHNETIUM TO 99M ALBUMIN AGGREGATED
5.5000 | Freq: Once | INTRAVENOUS | Status: AC | PRN
  Administered 2013-06-12: 6 via INTRAVENOUS

## 2013-06-12 MED ORDER — TECHNETIUM TC 99M DIETHYLENETRIAME-PENTAACETIC ACID: 45.0000 | Freq: Once | INTRAVENOUS | Status: DC | PRN

## 2013-06-12 NOTE — Progress Notes (Signed)
Patient discharge home with husband, alert and oriented, discharge instructions given, patient and husband verbalize understanding of orders given, My Chart access denied at this time, patient in stable condition at this time

## 2013-06-12 NOTE — Progress Notes (Signed)
VASCULAR LAB PRELIMINARY  PRELIMINARY  PRELIMINARY  PRELIMINARY  Bilateral lower extremity venous duplex  completed.    Preliminary report:  Bilateral:  No evidence of DVT, superficial thrombosis, or Baker's Cyst.    Kaylee Hansen, RVT 06/12/2013, 11:23 AM

## 2013-06-12 NOTE — Discharge Summary (Signed)
Physician Discharge Summary  Kaylee Hansen ZOX:[id number] DOB: 1956-05-18 DOA: 06/09/2013  PCP: Sonda Primes, MD  Admit date: 06/09/2013 Discharge date: 06/12/2013  Recommendations for Outpatient Follow-up:  1. Pt will need to follow up with PCP in 2 weeks post discharge 2. Please obtain BMP to evaluate electrolytes and kidney function 3. Please also check CBC to evaluate Hg and Hct levels   Discharge Diagnoses:  Principal Problem:   Pneumonia Active Problems:   HYPOTHYROIDISM   SMOKER   COPD   SEIZURE DISORDER   LUNG CANCER, HX OF   Chest pain   Atypical chest pain Moderate to severe COPD/increasing, gold stage 3-4  -6 minute walk test--no oxygen desaturation  -continue albuterol every 6 when necessary while awake, ipratropium every 6 when necessary while awake,  --strongly counseled regarding cessation of tobacco-as she is not producing purulent sputum, per her Gold guidelines does not require antibiotics  -CT chest negative for infiltrates which is scattered groundglass opacities  -Monitor off of antibiotics--she remained clinically stable Atypical Chest pain  -troponins neg  -EKG neg for ischemia  -V/Q scan--neg for PE -cxr--right suprahilar scarring Scattered bilateral pulmonary nodules -This was noted on CT of the chest 06/10/2013 -She will need outpatient followup and surveillance Significant tobacco abuse  -tobacco cessation discussed -f/u PCP regarding possible start of chantix History of reflux, collagenous colitis  -stable at present outpatient GI follow-continue pantoprazole 40 daily  Dysnomia with history of CVA 1997-unclear why not on Plavix or aspirin. -Start aspirin 81 mg at least-if intolerance, then discontinue  Chronic pain  --continue Duragesic 12.5 every 3 days, gabapentin 100 3 times a day, -Percocet on 10/325 every 6 hourly-suggest outpatient pain physician  Bipolar  -continue Xanax 0.25mg  3 times a day, Paxil 40 daily, trazodone 200 each  bedtime  History seizures  -last seizure a while back-continue Dilantin 100 a.m., 200 p.m.  -no seizure during hospitalization History migraines  -continue Zomig 5 mg when necessary  Hypothyroidism  -continue levothyroxine daily   Discharge Condition: stable  Disposition: home  Diet:heart healthy Wt Readings from Last 3 Encounters:  06/09/13 72.5 kg (159 lb 13.3 oz)  04/17/13 75.297 kg (166 lb)  03/05/13 73.936 kg (163 lb)    History of present illness:  57 year old ?, known history lung cancer 1999 [, STILL SMOKER] history dysphagia also is October 2010, right eyelid neoplasm, hypothyroidism, chronic low back pain, history left hip avascular necrosis 2007, Collagenous colitis, history CVA 1997, long standing COPD, seizure disorder on medication, total hip replacement, hysterectomy 1992, cholecystectomy admitted 06/09/2013 with shortness of breath and some mild chest pain  The patient was placed on telemetry. Troponins were negative. EKG did not showing ischemic changes. The patient was placed on aerosolized albuterol and Atrovent. V/Q scan was neg for PE.  CXR neg for PNA.  Antibiotics were initially started, they were discontinued and the patient was observed off of antibiotics. She remained clinically stable.     Discharge Exam: Filed Vitals:   06/12/13 1412  BP: 93/51  Pulse: 90  Temp: 97.8 F (36.6 C)  Resp: 16   Filed Vitals:   06/12/13 0844 06/12/13 1209 06/12/13 1412 06/12/13 1427  BP:   93/51   Pulse:   90   Temp:   97.8 F (36.6 C)   TempSrc:   Oral   Resp:   16   Height:      Weight:      SpO2: 94% 97% 98% 97%   General: A&O x  3, NAD, pleasant, cooperative Cardiovascular: RRR, no rub, no gallop, no S3 Respiratory: CTAB, no wheeze, no rhonchi Abdomen:soft, nontender, nondistended, positive bowel sounds Extremities: trace LE edema, No lymphangitis, no petechiae  Discharge Instructions      Discharge Orders   Future Appointments Provider  Department Dept Phone   07/18/2013 9:30 AM Tresa Garter, MD [facility] [phone]   Future Orders Complete By Expires   Diet - low sodium heart healthy  As directed    Increase activity slowly  As directed        Medication List         albuterol 108 (90 BASE) MCG/ACT inhaler  Commonly known as:  PROVENTIL HFA;VENTOLIN HFA  Inhale 2 puffs into the lungs every 6 (six) hours as needed for wheezing.     ALPRAZolam 0.25 MG tablet  Commonly known as:  XANAX  - Take 1 tablet (0.25 mg total) by mouth 3 (three) times daily as needed for sleep or anxiety. Take one tablet by mouth 2-3 times daily as needed for anxiety  -   -   - (3 months supply)     calcitRIOL 0.25 MCG capsule  Commonly known as:  ROCALTROL  Take 1 capsule (0.25 mcg total) by mouth daily.     cholecalciferol 1000 UNITS tablet  Commonly known as:  VITAMIN D  Take 1,000 Units by mouth daily. 2 po qd     cycloSPORINE 0.05 % ophthalmic emulsion  Commonly known as:  RESTASIS  Place 1 drop into both eyes 2 (two) times daily.     fentaNYL 12 MCG/HR  Commonly known as:  DURAGESIC - dosed mcg/hr  Place 12.5 mcg onto the skin every 3 (three) days.     gabapentin 100 MG capsule  Commonly known as:  NEURONTIN  Take 1 capsule (100 mg total) by mouth 3 (three) times daily. 3 months supply     HYDROcodone-acetaminophen 10-325 MG per tablet  Commonly known as:  NORCO  Take 1 tablet by mouth every 6 (six) hours as needed for moderate pain. Please fill on or after 06/19/13     ketoconazole 2 % cream  Commonly known as:  NIZORAL  APPLY TOPICALLY DAILY.     levothyroxine 150 MCG tablet  Commonly known as:  SYNTHROID, LEVOTHROID  Take 1 tablet (150 mcg total) by mouth daily.     NON FORMULARY  Oxygen- use at night as directed     pantoprazole 40 MG tablet  Commonly known as:  PROTONIX  Take 40 mg by mouth daily.     PARoxetine 40 MG tablet  Commonly known as:  PAXIL  Take 1  tablet (40 mg total) by mouth daily.     phenytoin 100 MG ER capsule  Commonly known as:  DILANTIN  Take 100-200 mg by mouth 2 (two) times daily. 100 mg every morning and 200 mg every night.     polyethylene glycol packet  Commonly known as:  MIRALAX / GLYCOLAX  Take 17 g by mouth daily as needed for mild constipation.     promethazine 25 MG tablet  Commonly known as:  PHENERGAN  Take 25 mg by mouth daily as needed.     traZODone 100 MG tablet  Commonly known as:  DESYREL  Take 200 mg by mouth at bedtime.     zolmitriptan 5 MG disintegrating tablet  Commonly known as:  ZOMIG-ZMT  Take 5 mg by mouth as needed for migraine.  The results of significant diagnostics from this hospitalization (including imaging, microbiology, ancillary and laboratory) are listed below for reference.    Significant Diagnostic Studies: Dg Chest 2 View  06/11/2013   CLINICAL DATA:  Chest pain with shortness of breath. History of hypertension.  EXAM: CHEST  2 VIEW  COMPARISON:  06/09/2013 radiographs; CT 06/10/2013.  FINDINGS: The heart size and mediastinal contours are stable. There is pleural thickening or extrapleural fat deposition bilaterally adjacent to old rib fractures. Right suprahilar scarring appears stable. No airspace disease, pleural effusion or pneumothorax is demonstrated. Telemetry leads overlie the chest.  IMPRESSION: Stable right suprahilar scarring.  No acute findings identified.   Electronically Signed   By: Roxy Horseman M.D.   On: 06/11/2013 21:08   Dg Chest 2 View  06/09/2013   CLINICAL DATA:  Cough, chest pain, shortness of breath, history of COPD, smoking and lung cancer  EXAM: CHEST  2 VIEW  COMPARISON:  09/12/2012; CT abdomen pelvis -05/21/2012  FINDINGS: Grossly unchanged borderline enlarged cardiac silhouette and mediastinal contours with atherosclerotic plaque within a tortuous and possibly ectatic thoracic aorta. Overall improved aeration of the lungs with resolution of  previously noted small bilateral effusions. The lungs appear hyperexpanded with flattening of the bilateral mid diaphragms mild diffuse slightly nodular thickening of the pulmonary interstitium. Interval development of an apparent nodular airspace opacity within the mid/lower left lower lung. No pleural effusion or pneumothorax. Grossly unchanged bones. Postcholecystectomy.  IMPRESSION: 1. Lung hyperexpansion with development of an apparent nodular airspace opacity within the left mid/lower lung, possibly an area of developing infection. A follow-up chest radiograph in 4 to 6 weeks after treatment is recommended to ensure resolution. 2. Overall improved aeration of the lungs with resolution of previously noted bilateral pleural effusions.   Electronically Signed   By: Simonne Come M.D.   On: 06/09/2013 18:37   Ct Chest W Contrast  06/10/2013   CLINICAL DATA:  Progressive shortness of breath with productive cough. Possible pneumonia on recent radiographs. History of lung cancer.  EXAM: CT CHEST WITH CONTRAST  TECHNIQUE: Multidetector CT imaging of the chest was performed during intravenous contrast administration.  CONTRAST:  80mL OMNIPAQUE IOHEXOL 300 MG/ML  SOLN  COMPARISON:  Chest radiographs 09/12/2012 and 06/09/2013. Abdominal CT 05/21/2012. No previous chest CT available.  FINDINGS: No enlarged mediastinal, hilar or axillary lymph nodes are identified. There is a 6 mm right periaortic node on image 22. There is scattered atherosclerosis of the aorta, great vessels and coronary arteries. No significant pleural or pericardial effusion is present.  There are probable radiation changes medially in the right upper lobe. Underlying emphysema is noted with numerous scattered nonspecific small pulmonary nodules. A 6 mm focal ground-glass opacity is present in the left upper lobe on image 13. A less well-defined ground-glass opacities noted more superiorly in the left upper lobe on image 11. No dominant mass,  endobronchial lesion or confluent airspace opacity is identified. Specifically, there is no focal abnormality in left lower lobe to correspond with the radiographic density. I believe this is due to a healing fracture of the left 8th rib. Several other old rib fractures are noted bilaterally.  The visualized upper abdomen has a stable appearance following cholecystectomy. The visualized left renal cysts are unchanged. There is no adrenal mass or worrisome osseous finding.  IMPRESSION: 1. No evidence of pneumonia or other acute process. 2. No specific features of recurrent lung cancer or metastatic disease. There is chronic lung disease with emphysema and scattered  nonspecific small pulmonary nodules bilaterally. There are presumed radiation changes medially in the right upper lobe. Given the patient's history of lung cancer, if comparison studies remain unavailable, follow-up chest CT in 4-6 months suggested to assess stability. 3. Scattered atherosclerosis. 4. Old rib fractures bilaterally. One of these on the left is felt to account for the suggested recent radiographic finding.   Electronically Signed   By: Roxy Horseman M.D.   On: 06/10/2013 09:36   Nm Pulmonary Perf And Vent  06/12/2013   CLINICAL DATA:  Chest pain and shortness of breath.  EXAM: NUCLEAR MEDICINE VENTILATION - PERFUSION LUNG SCAN  TECHNIQUE: Ventilation images were obtained in multiple projections using inhaled aerosol technetium 99 M DTPA. Perfusion images were obtained in multiple projections after intravenous injection of Tc-67m MAA.  COMPARISON:  Chest x-ray dated 06/11/2013 and CT scan of the chest dated 06/10/2013  RADIOPHARMACEUTICALS:  45.0 mCi Tc-75m DTPA aerosol and 5.5 mCi Tc-75m MAA  FINDINGS: Ventilation: Small ventilation defect at the posterior superior medial aspect of the right upper lobe.  Perfusion: Small matching perfusion defect in the posterior superior medial aspect of the right lung an area of parenchymal scarring on  the recent chest CT.  IMPRESSION: No evidence of pulmonary embolus. Single matched defect in the posterior superior medial aspect of the right lung which correlates with an area of parenchymal scarring on chest CT.   Electronically Signed   By: Geanie Cooley M.D.   On: 06/12/2013 13:56   [place] Extrem Low Right Ltd  06/03/2013   [facility] US performed of: Right hip  This study was ordered, performed, and interpreted by Terrilee Files D.O.  Hip:  Trochanteric bursa does have swelling mild enlargement  Acetabular labrum visualized and without tears, displacement, or effusion  in joint.  Femoral neck appears unremarkable without increased power doppler signal  along Cortex.   06/03/2013   Greater trochanteric bursitis     Procedure: Real-time Ultrasound Guided Injection of right greater  trochanteric bursitis secondary to patient's body habitus  Device: GE Logiq E  Ultrasound guided injection is preferred based studies that show increased  duration, increased effect, greater accuracy, decreased procedural pain,  increased response rate, and decreased cost with ultrasound guided versus  blind injection.  Verbal informed consent obtained.  Time-out conducted.  Noted no overlying erythema, induration, or other signs of local  infection.  Skin prepped in a sterile fashion.  Local anesthesia: Topical Ethyl chloride.  With sterile technique and under real time ultrasound guidance: Greater  trochanteric area was visualized and patient's bursa was noted. A 22-gauge  3 inch needle was inserted and 4 cc of 0.5% Marcaine and 1 cc of Kenalog  40 mg/dL was injected. Pictures taken  Completed without difficulty  Pain immediately resolved suggesting accurate placement of the medication.   Advised to call if fevers/chills, erythema, induration, drainage, or  persistent bleeding.  Images permanently stored and available for review in the ultrasound unit.   Impression: Technically successful ultrasound guided injection.      Microbiology: Recent Results (from the past 240 hour(s))  CULTURE, BLOOD (ROUTINE X 2)     Status: None   Collection Time    06/09/13  8:40 PM      Result Value Range Status   Specimen Description BLOOD LEFT HAND   Final   Special Requests BOTTLES DRAWN AEROBIC ONLY   Final   Culture  Setup Time     Final   Value: 06/10/2013 01:46  Performed at [facility]     Final   Value:        BLOOD CULTURE RECEIVED NO GROWTH TO DATE CULTURE WILL BE HELD FOR 5 DAYS BEFORE ISSUING A FINAL NEGATIVE REPORT     Performed at [facility]   Report Status PENDING   Incomplete  CULTURE, BLOOD (ROUTINE X 2)     Status: None   Collection Time    06/09/13  9:10 PM      Result Value Range Status   Specimen Description BLOOD LEFT FOOT   Final   Special Requests BOTTLES DRAWN AEROBIC AND ANAEROBIC    Final   Culture  Setup Time     Final   Value: 06/10/2013 01:47     Performed at [facility]   Culture     Final   Value:        BLOOD CULTURE RECEIVED NO GROWTH TO DATE CULTURE WILL BE HELD FOR 5 DAYS BEFORE ISSUING A FINAL NEGATIVE REPORT     Performed at [facility]   Report Status PENDING   Incomplete     Labs: Basic Metabolic Panel:  Recent Labs Lab 06/09/13 2040 06/10/13 0554 06/10/13 1211 06/12/13 0550  NA 134* 137 140 136  K 3.9 5.1 4.7 4.8  CL 98 104 106 103  CO2 25 27 27 24   GLUCOSE 104* 106* 89 110*  BUN 18 17 15 16   CREATININE 0.83 0.82 0.77 0.79  CALCIUM 9.9 9.4 9.8 9.6  MG  --   --   --  2.1   Liver Function Tests: No results found for this basename: AST, ALT, ALKPHOS, BILITOT, PROT, ALBUMIN,  in the last 168 hours No results found for this basename: LIPASE, AMYLASE,  in the last 168 hours No results found for this basename: AMMONIA,  in the last 168 hours CBC:  Recent Labs Lab 06/09/13 2040 06/10/13 0554 06/12/13 0550  WBC 17.1* 10.9* 10.4  HGB 13.2 11.3* 11.7*  HCT 39.8 34.1* 36.1  MCV 97.1 97.4 99.7   PLT 291 258 265   Cardiac Enzymes:  Recent Labs Lab 06/10/13 0045 06/10/13 0554 06/10/13 1211  TROPONINI <0.30 <0.30 <0.30   BNP: No components found with this basename: POCBNP,  CBG: No results found for this basename: GLUCAP,  in the last 168 hours  Time coordinating discharge:  Greater than 30 minutes  Signed:  Janiyla Long, DO [facility] Pager: [phone] 06/12/2013, 2:40 PM

## 2013-06-16 LAB — CULTURE, BLOOD (ROUTINE X 2)
Culture: NO GROWTH
Culture: NO GROWTH

## 2013-06-23 ENCOUNTER — Ambulatory Visit (INDEPENDENT_AMBULATORY_CARE_PROVIDER_SITE_OTHER): Payer: Medicare Other | Admitting: Internal Medicine

## 2013-06-23 ENCOUNTER — Encounter: Payer: Self-pay | Admitting: Internal Medicine

## 2013-06-23 VITALS — BP 120/60 | HR 84 | Temp 98.4°F | Resp 16 | Wt 162.0 lb

## 2013-06-23 DIAGNOSIS — J449 Chronic obstructive pulmonary disease, unspecified: Secondary | ICD-10-CM

## 2013-06-23 DIAGNOSIS — F17200 Nicotine dependence, unspecified, uncomplicated: Secondary | ICD-10-CM

## 2013-06-23 DIAGNOSIS — J189 Pneumonia, unspecified organism: Secondary | ICD-10-CM

## 2013-06-23 DIAGNOSIS — Z720 Tobacco use: Secondary | ICD-10-CM

## 2013-06-23 DIAGNOSIS — F411 Generalized anxiety disorder: Secondary | ICD-10-CM

## 2013-06-23 DIAGNOSIS — I10 Essential (primary) hypertension: Secondary | ICD-10-CM

## 2013-06-23 MED ORDER — LEVOTHYROXINE SODIUM 150 MCG PO TABS: 150.0000 ug | ORAL_TABLET | Freq: Every day | ORAL | Status: DC

## 2013-06-23 MED ORDER — CLORAZEPATE DIPOTASSIUM 15 MG PO TABS: 15.0000 mg | ORAL_TABLET | Freq: Three times a day (TID) | ORAL | Status: DC | PRN

## 2013-06-23 MED ORDER — CALCITRIOL 0.25 MCG PO CAPS: 0.2500 ug | ORAL_CAPSULE | Freq: Every day | ORAL | Status: DC

## 2013-06-23 MED ORDER — VARENICLINE TARTRATE 1 MG PO TABS: 1.0000 mg | ORAL_TABLET | Freq: Two times a day (BID) | ORAL | Status: DC

## 2013-06-23 MED ORDER — VARENICLINE TARTRATE 0.5 MG X 11 & 1 MG X 42 PO MISC: ORAL | Status: DC

## 2013-06-23 MED ORDER — FENTANYL 12 MCG/HR TD PT72: 12.5000 ug | MEDICATED_PATCH | TRANSDERMAL | Status: DC

## 2013-06-23 MED ORDER — GABAPENTIN 100 MG PO CAPS: ORAL_CAPSULE | ORAL | Status: DC

## 2013-06-23 MED ORDER — TRAZODONE HCL 100 MG PO TABS: 200.0000 mg | ORAL_TABLET | Freq: Every day | ORAL | Status: DC

## 2013-06-23 MED ORDER — RIZATRIPTAN BENZOATE 10 MG PO TABS: 10.0000 mg | ORAL_TABLET | ORAL | Status: DC | PRN

## 2013-06-23 MED ORDER — HYDROCODONE-ACETAMINOPHEN 10-325 MG PO TABS: 1.0000 | ORAL_TABLET | Freq: Four times a day (QID) | ORAL | Status: DC | PRN

## 2013-06-23 NOTE — Assessment & Plan Note (Signed)
In a smoker - quitting 12/14 Continue with current prescription therapy as reflected on the Med list.

## 2013-06-23 NOTE — Assessment & Plan Note (Signed)
Continue with current prescription therapy as reflected on the Med list.  

## 2013-06-23 NOTE — Assessment & Plan Note (Signed)
Hosp records reviewed

## 2013-06-23 NOTE — Progress Notes (Signed)
HPI  Post-hosp f/u:  Admit date: 06/09/2013 Discharge date: 06/12/2013   Recommendations for Outpatient Follow-up:   Pt will need to follow up with PCP in 2 weeks post discharge Please obtain BMP to evaluate electrolytes and kidney function Please also check CBC to evaluate Hg and Hct levels     Discharge Diagnoses:  Principal Problem:   Pneumonia Active Problems:   HYPOTHYROIDISM   SMOKER   COPD   SEIZURE DISORDER   LUNG CANCER, HX OF   Chest pain   Atypical chest pain Moderate to severe COPD/increasing, gold stage 3-4   -6 minute walk test--no oxygen desaturation   -continue albuterol every 6 when necessary while awake, ipratropium every 6 when necessary while awake,   --strongly counseled regarding cessation of tobacco-as she is not producing purulent sputum, per her Gold guidelines does not require antibiotics   -CT chest negative for infiltrates which is scattered groundglass opacities   -Monitor off of antibiotics--she remained clinically stable Atypical Chest pain   -troponins neg   -EKG neg for ischemia   -V/Q scan--neg for PE -cxr--right suprahilar scarring Scattered bilateral pulmonary nodules -This was noted on CT of the chest 06/10/2013 -She will need outpatient followup and surveillance Significant tobacco abuse   -tobacco cessation discussed -f/u PCP regarding possible start of chantix History of reflux, collagenous colitis   -stable at present outpatient GI follow-continue pantoprazole 40 daily   Dysnomia with history of CVA 1997-unclear why not on Plavix or aspirin. -Start aspirin 81 mg at least-if intolerance, then discontinue   Chronic pain   --continue Duragesic 12.5 every 3 days, gabapentin 100 3 times a day, -Percocet on 10/325 every 6 hourly-suggest outpatient pain physician   Bipolar   -continue Xanax 0.25mg  3 times a day, Paxil 40 daily, trazodone 200 each bedtime   History seizures   -last seizure a while back-continue Dilantin 100  a.m., 200 p.m.   -no seizure during hospitalization History migraines   -continue Zomig 5 mg when necessary   Hypothyroidism  -continue levothyroxine daily     Discharge Condition: stable   Disposition: home   Diet:heart healthy Wt Readings from Last 3 Encounters:   06/09/13  72.5 kg (159 lb 13.3 oz)   04/17/13  75.297 kg (166 lb)   03/05/13  73.936 kg (163 lb)        History of present illness:   57 year old ?, known history lung cancer 1999 [, STILL SMOKER] history dysphagia also is October 2010, right eyelid neoplasm, hypothyroidism, chronic low back pain, history left hip avascular necrosis 2007, Collagenous colitis, history CVA 1997, long standing COPD, seizure disorder on medication, total hip replacement, hysterectomy 1992, cholecystectomy admitted 06/09/2013 with shortness of breath and some mild chest pain   The patient was placed on telemetry. Troponins were negative. EKG did not showing ischemic changes. The patient was placed on aerosolized albuterol and Atrovent. V/Q scan was neg for PE.  CXR neg for PNA.  Antibiotics were initially started, they were discontinued and the patient was observed off of antibiotics. She remained clinically stable.         Discharge Exam: Filed Vitals:     06/12/13 1412   BP:  93/51   Pulse:  90   Temp:  97.8 F (36.6 C)   Resp:  16       Filed Vitals:     06/12/13 0844  06/12/13 1209  06/12/13 1412  06/12/13 1427   BP:  93/51     Pulse:      90     Temp:      97.8 F (36.6 C)     TempSrc:      Oral     Resp:      16     Height:           Weight:           SpO2:  94%  97%  98%  97%      General: A&O x 3, NAD, pleasant, cooperative Cardiovascular: RRR, no rub, no gallop, no S3 Respiratory: CTAB, no wheeze, no rhonchi Abdomen:soft, nontender, nondistended, positive bowel sounds Extremities: trace LE edema, No lymphangitis, no petechiae   Discharge Instructions          Discharge Orders      Future Appointments  Provider  Department  Dept Phone     07/18/2013 9:30 AM  Tresa Garter, MD  [facility]  [phone]     Future Orders  Complete By  Expires     Diet - low sodium heart healthy   As directed       Increase activity slowly   As directed               Medication List              albuterol 108 (90 BASE) MCG/ACT inhaler   Commonly known as:  PROVENTIL HFA;VENTOLIN HFA   Inhale 2 puffs into the lungs every 6 (six) hours as needed for wheezing.         ALPRAZolam 0.25 MG tablet   Commonly known as:  XANAX   - Take 1 tablet (0.25 mg total) by mouth 3 (three) times daily as needed for sleep or anxiety. Take one tablet by mouth 2-3 times daily as needed for anxiety   -    -    - (3 months supply)         calcitRIOL 0.25 MCG capsule   Commonly known as:  ROCALTROL   Take 1 capsule (0.25 mcg total) by mouth daily.         cholecalciferol 1000 UNITS tablet   Commonly known as:  VITAMIN D   Take 1,000 Units by mouth daily. 2 po qd         cycloSPORINE 0.05 % ophthalmic emulsion   Commonly known as:  RESTASIS   Place 1 drop into both eyes 2 (two) times daily.         fentaNYL 12 MCG/HR   Commonly known as:  DURAGESIC - dosed mcg/hr   Place 12.5 mcg onto the skin every 3 (three) days.         gabapentin 100 MG capsule   Commonly known as:  NEURONTIN   Take 1 capsule (100 mg total) by mouth 3 (three) times daily. 3 months supply         HYDROcodone-acetaminophen 10-325 MG per tablet   Commonly known as:  NORCO   Take 1 tablet by mouth every 6 (six) hours as needed for moderate pain. Please fill on or after 06/19/13         ketoconazole 2 % cream   Commonly known as:  NIZORAL   APPLY TOPICALLY DAILY.         levothyroxine 150 MCG tablet   Commonly known as:  SYNTHROID, LEVOTHROID   Take 1 tablet (150 mcg total) by mouth daily.  NON FORMULARY   Oxygen- use at night as directed         pantoprazole 40  MG tablet   Commonly known as:  PROTONIX   Take 40 mg by mouth daily.         PARoxetine 40 MG tablet   Commonly known as:  PAXIL   Take 1 tablet (40 mg total) by mouth daily.         phenytoin 100 MG ER capsule   Commonly known as:  DILANTIN   Take 100-200 mg by mouth 2 (two) times daily. 100 mg every morning and 200 mg every night.         polyethylene glycol packet   Commonly known as:  MIRALAX / GLYCOLAX   Take 17 g by mouth daily as needed for mild constipation.         promethazine 25 MG tablet   Commonly known as:  PHENERGAN   Take 25 mg by mouth daily as needed.         traZODone 100 MG tablet   Commonly known as:  DESYREL   Take 200 mg by mouth at bedtime.         zolmitriptan 5 MG disintegrating tablet   Commonly known as:  ZOMIG-ZMT   Take 5 mg by mouth as needed for migraine.                --------------------------------------------------------------------------------   The results of significant diagnostics from this hospitalization (including imaging, microbiology, ancillary and laboratory) are listed below for reference.       Significant Diagnostic Studies: Dg Chest 2 View   06/11/2013   CLINICAL DATA:  Chest pain with shortness of breath. History of hypertension.  EXAM: CHEST  2 VIEW  COMPARISON:  06/09/2013 radiographs; CT 06/10/2013.  FINDINGS: The heart size and mediastinal contours are stable. There is pleural thickening or extrapleural fat deposition bilaterally adjacent to old rib fractures. Right suprahilar scarring appears stable. No airspace disease, pleural effusion or pneumothorax is demonstrated. Telemetry leads overlie the chest.  IMPRESSION: Stable right suprahilar scarring.  No acute findings identified.   Electronically Signed   By: Roxy Horseman M.D.   On: 06/11/2013 21:08    Dg Chest 2 View   06/09/2013   CLINICAL DATA:  Cough, chest pain, shortness of breath, history of COPD, smoking and lung cancer  EXAM: CHEST  2 VIEW   COMPARISON:  09/12/2012; CT abdomen pelvis -05/21/2012  FINDINGS: Grossly unchanged borderline enlarged cardiac silhouette and mediastinal contours with atherosclerotic plaque within a tortuous and possibly ectatic thoracic aorta. Overall improved aeration of the lungs with resolution of previously noted small bilateral effusions. The lungs appear hyperexpanded with flattening of the bilateral mid diaphragms mild diffuse slightly nodular thickening of the pulmonary interstitium. Interval development of an apparent nodular airspace opacity within the mid/lower left lower lung. No pleural effusion or pneumothorax. Grossly unchanged bones. Postcholecystectomy.  IMPRESSION: 1. Lung hyperexpansion with development of an apparent nodular airspace opacity within the left mid/lower lung, possibly an area of developing infection. A follow-up chest radiograph in 4 to 6 weeks after treatment is recommended to ensure resolution. 2. Overall improved aeration of the lungs with resolution of previously noted bilateral pleural effusions.   Electronically Signed   By: Simonne Come M.D.   On: 06/09/2013 18:37    Ct Chest W Contrast   06/10/2013   CLINICAL DATA:  Progressive shortness of breath with productive cough. Possible pneumonia on recent radiographs. History  of lung cancer.  EXAM: CT CHEST WITH CONTRAST  TECHNIQUE: Multidetector CT imaging of the chest was performed during intravenous contrast administration.  CONTRAST:  80mL OMNIPAQUE IOHEXOL 300 MG/ML  SOLN  COMPARISON:  Chest radiographs 09/12/2012 and 06/09/2013. Abdominal CT 05/21/2012. No previous chest CT available.  FINDINGS: No enlarged mediastinal, hilar or axillary lymph nodes are identified. There is a 6 mm right periaortic node on image 22. There is scattered atherosclerosis of the aorta, great vessels and coronary arteries. No significant pleural or pericardial effusion is present.  There are probable radiation changes medially in the right upper lobe.  Underlying emphysema is noted with numerous scattered nonspecific small pulmonary nodules. A 6 mm focal ground-glass opacity is present in the left upper lobe on image 13. A less well-defined ground-glass opacities noted more superiorly in the left upper lobe on image 11. No dominant mass, endobronchial lesion or confluent airspace opacity is identified. Specifically, there is no focal abnormality in left lower lobe to correspond with the radiographic density. I believe this is due to a healing fracture of the left 8th rib. Several other old rib fractures are noted bilaterally.  The visualized upper abdomen has a stable appearance following cholecystectomy. The visualized left renal cysts are unchanged. There is no adrenal mass or worrisome osseous finding.  IMPRESSION: 1. No evidence of pneumonia or other acute process. 2. No specific features of recurrent lung cancer or metastatic disease. There is chronic lung disease with emphysema and scattered nonspecific small pulmonary nodules bilaterally. There are presumed radiation changes medially in the right upper lobe. Given the patient's history of lung cancer, if comparison studies remain unavailable, follow-up chest CT in 4-6 months suggested to assess stability. 3. Scattered atherosclerosis. 4. Old rib fractures bilaterally. One of these on the left is felt to account for the suggested recent radiographic finding.   Electronically Signed   By: Roxy Horseman M.D.   On: 06/10/2013 09:36    Nm Pulmonary Perf And Vent   06/12/2013   CLINICAL DATA:  Chest pain and shortness of breath.  EXAM: NUCLEAR MEDICINE VENTILATION - PERFUSION LUNG SCAN  TECHNIQUE: Ventilation images were obtained in multiple projections using inhaled aerosol technetium 99 M DTPA. Perfusion images were obtained in multiple projections after intravenous injection of Tc-20m MAA.  COMPARISON:  Chest x-ray dated 06/11/2013 and CT scan of the chest dated 06/10/2013  RADIOPHARMACEUTICALS:  45.0 mCi  Tc-30m DTPA aerosol and 5.5 mCi Tc-47m MAA  FINDINGS: Ventilation: Small ventilation defect at the posterior superior medial aspect of the right upper lobe.  Perfusion: Small matching perfusion defect in the posterior superior medial aspect of the right lung an area of parenchymal scarring on the recent chest CT.  IMPRESSION: No evidence of pulmonary embolus. Single matched defect in the posterior superior medial aspect of the right lung which correlates with an area of parenchymal scarring on chest CT.   Electronically Signed   By: Geanie Cooley M.D.   On: 06/12/2013 13:56    [place] Extrem Low Right Ltd   06/03/2013   MSK US performed of: Right hip  This study was ordered, performed, and interpreted by Terrilee Files D.O.  Hip:  Trochanteric bursa does have swelling mild enlargement  Acetabular labrum visualized and without tears, displacement, or effusion  in joint.  Femoral neck appears unremarkable without increased power doppler signal  along Cortex.    06/03/2013   Greater trochanteric bursitis     Procedure: Real-time Ultrasound Guided Injection of right greater  trochanteric bursitis secondary to patient's body habitus  Device: GE Logiq E  Ultrasound guided injection is preferred based studies that show increased  duration, increased effect, greater accuracy, decreased procedural pain,  increased response rate, and decreased cost with ultrasound guided versus  blind injection.  Verbal informed consent obtained.  Time-out conducted.  Noted no overlying erythema, induration, or other signs of local  infection.  Skin prepped in a sterile fashion.  Local anesthesia: Topical Ethyl chloride.  With sterile technique and under real time ultrasound guidance: Greater  trochanteric area was visualized and patient's bursa was noted. A 22-gauge  3 inch needle was inserted and 4 cc of 0.5% Marcaine and 1 cc of Kenalog  40 mg/dL was injected. Pictures taken  Completed without difficulty  Pain immediately resolved suggesting  accurate placement of the medication.   Advised to call if fevers/chills, erythema, induration, drainage, or  persistent bleeding.  Images permanently stored and available for review in the ultrasound unit.   Impression: Technically successful ultrasound guided injection.      Microbiology: Recent Results (from the past 240 hour(s))   CULTURE, BLOOD (ROUTINE X 2)     Status: None     Collection Time      06/09/13  8:40 PM       Result  Value  Range  Status     Specimen Description  BLOOD LEFT HAND     Final     Special Requests  BOTTLES DRAWN AEROBIC ONLY     Final     Culture  Setup Time        Final     Value:  06/10/2013 01:Kaylee        Performed at [facility]     Culture        Final     Value:         BLOOD CULTURE RECEIVED NO GROWTH TO DATE CULTURE WILL BE HELD FOR 5 DAYS BEFORE ISSUING A FINAL NEGATIVE REPORT        Performed at [facility]     Report Status  PENDING     Incomplete   CULTURE, BLOOD (ROUTINE X 2)     Status: None     Collection Time      06/09/13  9:10 PM       Result  Value  Range  Status     Specimen Description  BLOOD LEFT FOOT     Final     Special Requests  BOTTLES DRAWN AEROBIC AND ANAEROBIC      Final     Culture  Setup Time        Final     Value:  06/10/2013 01:47        Performed at [facility]     Culture        Final     Value:         BLOOD CULTURE RECEIVED NO GROWTH TO DATE CULTURE WILL BE HELD FOR 5 DAYS BEFORE ISSUING A FINAL NEGATIVE REPORT        Performed at [facility]     Report Status  PENDING     Incomplete        Labs: Basic Metabolic Panel:   Recent Labs Lab  06/09/13 2040  06/10/13 0554  06/10/13 1211  06/12/13 0550   NA  134*  137  140  136   K  3.9  5.1  4.7  4.8   CL  98  104  106  103   CO2  25  27  27  24    GLUCOSE  104*  106*  89  110*   BUN  18  17  15  16    CREATININE  0.83  0.82  0.77  0.79   CALCIUM  9.9  9.4  9.8  9.6   MG   --    --    --   2.1      Liver  Function Tests: No results found for this basename: AST, ALT, ALKPHOS, BILITOT, PROT, ALBUMIN,  in the last 168 hours No results found for this basename: LIPASE, AMYLASE,  in the last 168 hours No results found for this basename: AMMONIA,  in the last 168 hours CBC:   Recent Labs Lab  06/09/13 2040  06/10/13 0554  06/12/13 0550   WBC  17.1*  10.9*  10.4   HGB  13.2  11.3*  11.7*   HCT  39.8  34.1*  36.1   MCV  97.1  97.4  99.7   PLT  291  258  265      Cardiac Enzymes:   Recent Labs Lab  06/10/13 0045  06/10/13 0554  06/10/13 1211   TROPONINI  <0.30  <0.30  <0.30      BNP: No components found with this basename: POCBNP,  CBG: No results found for this basename: GLUCAP,  in the last 168 hours   Time coordinating discharge:  Greater than 30 minutes   Signed:   TAT, DAVID, DO [facility] Pager: [phone] 06/12/2013, 2:40 PM            Patient does have an intra-articular fracture that is nondisplaced of the first proximal phalanx. This involves approximately 25% of the joint. In a CAM walker boot  F/u COPD, LBP, anxiety, siezures. C/o constipation, cramps C/o HH, food gets stuck in the throat, GERD C/o urinry urgency and frequency  BP Readings from Last 3 Encounters:  06/23/13 120/60  06/12/13 145/88  06/03/13 120/60      Review of Systems  Constitutional: Negative for activity change, appetite change, fatigue and unexpected weight change.  HENT: Negative for congestion, mouth sores, nosebleeds and sinus pressure.   Eyes: Negative for visual disturbance.  Respiratory: Positive for apnea (at night). Negative for chest tightness.   Gastrointestinal: Negative for anal bleeding.  Genitourinary: Negative for difficulty urinating and vaginal pain.  Musculoskeletal: Positive for back pain and gait problem (better).  Skin: Negative for pallor and wound.  Neurological: Negative for dizziness, tremors, weakness and numbness.   Psychiatric/Behavioral: Positive for sleep disturbance and decreased concentration. Negative for suicidal ideas, behavioral problems, confusion, self-injury and agitation. The patient is nervous/anxious.    Wt Readings from Last 3 Encounters:  06/23/13 162 lb (73.483 kg)  06/09/13 159 lb 13.3 oz (72.5 kg)  04/17/13 166 lb (75.297 kg)       Objective:   Physical Exam  Constitutional: She appears well-developed. No distress.  Obese, NAD  HENT:  Head: Normocephalic.  Right Ear: External ear normal.  Left Ear: External ear normal.  Nose: Nose normal.  Mouth/Throat: Oropharynx is clear and moist.  Eyes: Conjunctivae are normal. Pupils are equal, round, and reactive to light. Right eye exhibits no discharge. Left eye exhibits no discharge.  Neck: Normal range of motion. Neck supple. No JVD present. No tracheal deviation present. No thyromegaly present.  Cardiovascular: Normal rate, regular rhythm and normal  heart sounds.   Pulmonary/Chest: No stridor. No respiratory distress. She has no wheezes. She has rales. She exhibits tenderness.  B breasts with fibrocystic changes  Abdominal: Soft. Bowel sounds are normal. She exhibits no distension and no mass. There is tenderness. There is no rebound and no guarding.  LLQ is tender  Musculoskeletal: She exhibits tenderness. She exhibits no edema.  Lymphadenopathy:    She has no cervical adenopathy.  Neurological: She displays normal reflexes. No cranial nerve deficit. She exhibits normal muscle tone. Coordination abnormal.  Better - out of a w/c now  Skin: No rash noted. No erythema.  Psychiatric: She has a normal mood and affect. Her behavior is normal. Judgment and thought content normal.  Mole on R chest AKs, SKs In a w/c R ankle and R dorsal foot are less tender- in a Cam Walker   Lab Results  Component Value Date   WBC 10.4 06/12/2013   HGB 11.7* 06/12/2013   HCT 36.1 06/12/2013   PLT 265 06/12/2013   GLUCOSE 110* 06/12/2013    CHOL 339* 08/12/2009   TRIG 298.0* 08/12/2009   HDL 43.10 08/12/2009   LDLDIRECT 232.7 08/12/2009   LDLCALC  Value: 240        Total Cholesterol/HDL:CHD Risk Coronary Heart Disease Risk Table                     Men   Women  1/2 Average Risk   3.4   3.3  Average Risk       5.0   4.4  2 X Average Risk   9.6   7.1  3 X Average Risk  23.4   11.0        Use the calculated Patient Ratio above and the CHD Risk Table to determine the patient's CHD Risk.        ATP III CLASSIFICATION (LDL):  <100     mg/dL   Optimal  161-096  mg/dL   Near or Above                    Optimal  130-159  mg/dL   Borderline  045-409  mg/dL   High  >811     mg/dL   Very High* 91/47/8295   ALT 11 12/02/2012   AST 15 12/02/2012   NA 136 06/12/2013   K 4.8 06/12/2013   CL 103 06/12/2013   CREATININE 0.79 06/12/2013   BUN 16 06/12/2013   CO2 24 06/12/2013   TSH 1.44 01/18/2012   INR 1.0 09/16/2008   HGBA1C  Value: 5.0 (NOTE)   The ADA recommends the following therapeutic goal for glycemic   control related to Hgb A1C measurement:   Goal of Therapy:   < 7.0% Hgb A1C   Reference: American Diabetes Association: Clinical Practice   Recommendations 2008, Diabetes Care,  2008, 31:(Suppl 1). 09/17/2008        Assessment & Plan:

## 2013-06-23 NOTE — Assessment & Plan Note (Signed)
In a smoker - quitting 12/14 Chantix Rx

## 2013-06-23 NOTE — Progress Notes (Signed)
Pre visit review using our clinic review tool, if applicable. No additional management support is needed unless otherwise documented below in the visit note. 

## 2013-06-30 ENCOUNTER — Telehealth: Payer: Self-pay

## 2013-06-30 NOTE — Telephone Encounter (Signed)
The patient [employer] rep called and wanted to get the CMA to do a prior auth for the patient's medication that she is trying to get refilled at the [facility].   [employer] rep - Name- Lennette Bihari (callback - [phone] ext [phone])

## 2013-07-04 NOTE — Telephone Encounter (Signed)
Called [facility] and answered all questions for Clorazepate PA. Will send for review and mail [place] and pt a letter with approval or denial.

## 2013-07-08 ENCOUNTER — Telehealth: Payer: Self-pay | Admitting: *Deleted

## 2013-07-08 NOTE — Telephone Encounter (Signed)
Patient phoned, inquiring about medication approval.  Notified patient of Stacey's previous notation.  Pt states she's already "picked it up", not specifying whether she meant the letter or the medication.

## 2013-07-18 ENCOUNTER — Ambulatory Visit (INDEPENDENT_AMBULATORY_CARE_PROVIDER_SITE_OTHER): Payer: Medicare Other | Admitting: Internal Medicine

## 2013-07-18 ENCOUNTER — Encounter: Payer: Self-pay | Admitting: Internal Medicine

## 2013-07-18 VITALS — BP 120/52 | HR 76 | Temp 98.4°F | Resp 16 | Wt 160.0 lb

## 2013-07-18 DIAGNOSIS — Z720 Tobacco use: Secondary | ICD-10-CM

## 2013-07-18 DIAGNOSIS — M545 Low back pain, unspecified: Secondary | ICD-10-CM

## 2013-07-18 DIAGNOSIS — S92401A Displaced unspecified fracture of right great toe, initial encounter for closed fracture: Secondary | ICD-10-CM

## 2013-07-18 DIAGNOSIS — J449 Chronic obstructive pulmonary disease, unspecified: Secondary | ICD-10-CM

## 2013-07-18 DIAGNOSIS — S92919A Unspecified fracture of unspecified toe(s), initial encounter for closed fracture: Secondary | ICD-10-CM

## 2013-07-18 DIAGNOSIS — F411 Generalized anxiety disorder: Secondary | ICD-10-CM

## 2013-07-18 DIAGNOSIS — F17200 Nicotine dependence, unspecified, uncomplicated: Secondary | ICD-10-CM

## 2013-07-18 DIAGNOSIS — J209 Acute bronchitis, unspecified: Secondary | ICD-10-CM

## 2013-07-18 MED ORDER — FENTANYL 12 MCG/HR TD PT72: 12.5000 ug | MEDICATED_PATCH | TRANSDERMAL | Status: DC

## 2013-07-18 MED ORDER — HYDROCODONE-ACETAMINOPHEN 10-325 MG PO TABS: 1.0000 | ORAL_TABLET | Freq: Four times a day (QID) | ORAL | Status: DC | PRN

## 2013-07-18 MED ORDER — LEVOFLOXACIN 500 MG PO TABS: 500.0000 mg | ORAL_TABLET | Freq: Every day | ORAL | Status: DC

## 2013-07-18 MED ORDER — HYDROXYZINE HCL 50 MG PO TABS: 50.0000 mg | ORAL_TABLET | Freq: Every evening | ORAL | Status: DC | PRN

## 2013-07-18 MED ORDER — PANTOPRAZOLE SODIUM 40 MG PO TBEC: 40.0000 mg | DELAYED_RELEASE_TABLET | Freq: Every day | ORAL | Status: DC

## 2013-07-18 NOTE — Assessment & Plan Note (Signed)
Better  

## 2013-07-18 NOTE — Assessment & Plan Note (Signed)
quitting 1/15 4 cigs/d

## 2013-07-18 NOTE — Progress Notes (Signed)
Pre visit review using our clinic review tool, if applicable. No additional management support is needed unless otherwise documented below in the visit note.-pc

## 2013-07-18 NOTE — Assessment & Plan Note (Signed)
Continue with current prescription therapy as reflected on the Med list.  

## 2013-07-18 NOTE — Assessment & Plan Note (Signed)
Levaquin x 10 d 

## 2013-07-20 ENCOUNTER — Encounter: Payer: Self-pay | Admitting: Internal Medicine

## 2013-07-20 NOTE — Progress Notes (Signed)
HPI   Patient is s/p an intra-articular fracture that is nondisplaced of the first proximal phalanx - healed.  F/u COPD, LBP, anxiety, siezures. C/o constipation, cramps C/o HH, food gets stuck in the throat, GERD C/o urinry urgency and frequency  BP Readings from Last 3 Encounters:  07/18/13 120/52  06/23/13 120/60  06/12/13 145/88      Review of Systems  Constitutional: Negative for activity change, appetite change, fatigue and unexpected weight change.  HENT: Negative for congestion, mouth sores, nosebleeds and sinus pressure.   Eyes: Negative for visual disturbance.  Respiratory: Positive for apnea (at night). Negative for chest tightness.   Gastrointestinal: Negative for anal bleeding.  Genitourinary: Negative for difficulty urinating and vaginal pain.  Musculoskeletal: Positive for back pain and gait problem (better).  Skin: Negative for pallor and wound.  Neurological: Negative for dizziness, tremors, weakness and numbness.  Psychiatric/Behavioral: Positive for sleep disturbance and decreased concentration. Negative for suicidal ideas, behavioral problems, confusion, self-injury and agitation. The patient is nervous/anxious.    Wt Readings from Last 3 Encounters:  07/18/13 160 lb (72.576 kg)  06/23/13 162 lb (73.483 kg)  06/09/13 159 lb 13.3 oz (72.5 kg)       Objective:   Physical Exam  Constitutional: She appears well-developed. No distress.  Obese, NAD  HENT:  Head: Normocephalic.  Right Ear: External ear normal.  Left Ear: External ear normal.  Nose: Nose normal.  Mouth/Throat: Oropharynx is clear and moist.  Eyes: Conjunctivae are normal. Pupils are equal, round, and reactive to light. Right eye exhibits no discharge. Left eye exhibits no discharge.  Neck: Normal range of motion. Neck supple. No JVD present. No tracheal deviation present. No thyromegaly present.  Cardiovascular: Normal rate, regular rhythm and normal heart sounds.    Pulmonary/Chest: No stridor. No respiratory distress. She has no wheezes. She has rales. She exhibits tenderness.  B breasts with fibrocystic changes  Abdominal: Soft. Bowel sounds are normal. She exhibits no distension and no mass. There is tenderness. There is no rebound and no guarding.  LLQ is tender  Musculoskeletal: She exhibits tenderness. She exhibits no edema.  Lymphadenopathy:    She has no cervical adenopathy.  Neurological: She displays normal reflexes. No cranial nerve deficit. She exhibits normal muscle tone. Coordination abnormal.  Better - out of a w/c now  Skin: No rash noted. No erythema.  Psychiatric: She has a normal mood and affect. Her behavior is normal. Judgment and thought content normal.   In a w/c R ankle and R dorsal foot are less tender   Lab Results  Component Value Date   WBC 10.4 06/12/2013   HGB 11.7* 06/12/2013   HCT 36.1 06/12/2013   PLT 265 06/12/2013   GLUCOSE 110* 06/12/2013   CHOL 339* 08/12/2009   TRIG 298.0* 08/12/2009   HDL 43.10 08/12/2009   LDLDIRECT 232.7 08/12/2009   LDLCALC  Value: 240        Total Cholesterol/HDL:CHD Risk Coronary Heart Disease Risk Table                     Men   Women  1/2 Average Risk   3.4   3.3  Average Risk       5.0   4.4  2 X Average Risk   9.6   7.1  3 X Average Risk  23.4   11.0        Use the calculated Patient Ratio above and the CHD Risk  Table to determine the patient's CHD Risk.        ATP III CLASSIFICATION (LDL):  <100     mg/dL   Optimal  100-129  mg/dL   Near or Above                    Optimal  130-159  mg/dL   Borderline  160-189  mg/dL   High  >190     mg/dL   Very High* 05/12/2009   ALT 11 12/02/2012   AST 15 12/02/2012   NA 136 06/12/2013   K 4.8 06/12/2013   CL 103 06/12/2013   CREATININE 0.79 06/12/2013   BUN 16 06/12/2013   CO2 24 06/12/2013   TSH 1.44 01/18/2012   INR 1.0 09/16/2008   HGBA1C  Value: 5.0 (NOTE)   The ADA recommends the following therapeutic goal for glycemic   control related to  Hgb A1C measurement:   Goal of Therapy:   < 7.0% Hgb A1C   Reference: American Diabetes Association: Clinical Practice   Recommendations 2008, Diabetes Care,  2008, 31:(Suppl 1). 09/17/2008        Assessment & Plan:

## 2013-07-21 ENCOUNTER — Telehealth: Payer: Self-pay | Admitting: Internal Medicine

## 2013-07-21 NOTE — Telephone Encounter (Signed)
Relevant patient education assigned to patient using Emmi. ° °

## 2013-09-03 ENCOUNTER — Other Ambulatory Visit: Payer: Self-pay | Admitting: Internal Medicine

## 2013-09-15 ENCOUNTER — Other Ambulatory Visit: Payer: Self-pay | Admitting: Internal Medicine

## 2013-09-18 ENCOUNTER — Other Ambulatory Visit: Payer: Self-pay | Admitting: Internal Medicine

## 2013-10-17 ENCOUNTER — Other Ambulatory Visit (INDEPENDENT_AMBULATORY_CARE_PROVIDER_SITE_OTHER): Payer: Medicare Other

## 2013-10-17 ENCOUNTER — Ambulatory Visit (INDEPENDENT_AMBULATORY_CARE_PROVIDER_SITE_OTHER): Payer: Medicare Other | Admitting: Internal Medicine

## 2013-10-17 ENCOUNTER — Encounter: Payer: Self-pay | Admitting: Internal Medicine

## 2013-10-17 VITALS — BP 130/58 | HR 82 | Temp 97.7°F | Resp 16 | Ht 65.0 in | Wt 172.0 lb

## 2013-10-17 DIAGNOSIS — R269 Unspecified abnormalities of gait and mobility: Secondary | ICD-10-CM

## 2013-10-17 DIAGNOSIS — E039 Hypothyroidism, unspecified: Secondary | ICD-10-CM

## 2013-10-17 DIAGNOSIS — R569 Unspecified convulsions: Secondary | ICD-10-CM

## 2013-10-17 DIAGNOSIS — I10 Essential (primary) hypertension: Secondary | ICD-10-CM

## 2013-10-17 DIAGNOSIS — G4700 Insomnia, unspecified: Secondary | ICD-10-CM

## 2013-10-17 DIAGNOSIS — B079 Viral wart, unspecified: Secondary | ICD-10-CM | POA: Insufficient documentation

## 2013-10-17 DIAGNOSIS — F17200 Nicotine dependence, unspecified, uncomplicated: Secondary | ICD-10-CM

## 2013-10-17 DIAGNOSIS — E559 Vitamin D deficiency, unspecified: Secondary | ICD-10-CM

## 2013-10-17 DIAGNOSIS — I69920 Aphasia following unspecified cerebrovascular disease: Secondary | ICD-10-CM

## 2013-10-17 DIAGNOSIS — F411 Generalized anxiety disorder: Secondary | ICD-10-CM

## 2013-10-17 LAB — CBC WITH DIFFERENTIAL/PLATELET
Basophils Absolute: 0.1 10*3/uL (ref 0.0–0.1)
Basophils Relative: 0.9 % (ref 0.0–3.0)
Eosinophils Absolute: 0.4 10*3/uL (ref 0.0–0.7)
Eosinophils Relative: 4.1 % (ref 0.0–5.0)
HCT: 36.6 % (ref 36.0–46.0)
Hemoglobin: 12.3 g/dL (ref 12.0–15.0)
Lymphocytes Relative: 27.5 % (ref 12.0–46.0)
Lymphs Abs: 2.4 10*3/uL (ref 0.7–4.0)
MCHC: 33.5 g/dL (ref 30.0–36.0)
MCV: 98 fl (ref 78.0–100.0)
Monocytes Absolute: 0.6 10*3/uL (ref 0.1–1.0)
Monocytes Relative: 6.6 % (ref 3.0–12.0)
Neutro Abs: 5.3 10*3/uL (ref 1.4–7.7)
Neutrophils Relative %: 60.9 % (ref 43.0–77.0)
Platelets: 276 10*3/uL (ref 150.0–400.0)
RBC: 3.73 Mil/uL — ABNORMAL LOW (ref 3.87–5.11)
RDW: 13.7 % (ref 11.5–14.6)
WBC: 8.7 10*3/uL (ref 4.5–10.5)

## 2013-10-17 LAB — BASIC METABOLIC PANEL
BUN: 12 mg/dL (ref 6–23)
CO2: 29 mEq/L (ref 19–32)
Calcium: 9.8 mg/dL (ref 8.4–10.5)
Chloride: 103 mEq/L (ref 96–112)
Creatinine, Ser: 0.9 mg/dL (ref 0.4–1.2)
GFR: 71.58 mL/min (ref >60.00)
Glucose, Bld: 88 mg/dL (ref 70–99)
Potassium: 4.6 mEq/L (ref 3.5–5.1)
Sodium: 138 mEq/L (ref 135–145)

## 2013-10-17 LAB — TSH: TSH: 0.36 u[IU]/mL (ref 0.35–5.50)

## 2013-10-17 MED ORDER — CIPROFLOXACIN HCL 250 MG PO TABS: 250.0000 mg | ORAL_TABLET | Freq: Two times a day (BID) | ORAL | Status: DC

## 2013-10-17 MED ORDER — FENTANYL 12 MCG/HR TD PT72: 12.5000 ug | MEDICATED_PATCH | TRANSDERMAL | Status: DC

## 2013-10-17 MED ORDER — HYDROCODONE-ACETAMINOPHEN 10-325 MG PO TABS: 1.0000 | ORAL_TABLET | Freq: Four times a day (QID) | ORAL | Status: DC | PRN

## 2013-10-17 NOTE — Assessment & Plan Note (Signed)
Continue with current prescription therapy as reflected on the Med list. Labs  

## 2013-10-17 NOTE — Assessment & Plan Note (Signed)
See Procedure 

## 2013-10-17 NOTE — Assessment & Plan Note (Signed)
Continue with current prescription therapy as reflected on the Med list.  

## 2013-10-17 NOTE — Progress Notes (Signed)
HPI   Patient is s/p an intra-articular fracture that is nondisplaced of the first proximal phalanx - healed.  F/u COPD, LBP, anxiety, siezures. C/o constipation, cramps C/o HH, food gets stuck in the throat, GERD C/o urinry urgency and frequency  BP Readings from Last 3 Encounters:  10/17/13 130/58  07/18/13 120/52  06/23/13 120/60      Review of Systems  Constitutional: Negative for activity change, appetite change, fatigue and unexpected weight change.  HENT: Negative for congestion, mouth sores, nosebleeds and sinus pressure.   Eyes: Negative for visual disturbance.  Respiratory: Positive for apnea (at night). Negative for chest tightness.   Gastrointestinal: Negative for anal bleeding.  Genitourinary: Negative for difficulty urinating and vaginal pain.  Musculoskeletal: Positive for back pain and gait problem (better).  Skin: Negative for pallor and wound.  Neurological: Negative for dizziness, tremors, weakness and numbness.  Psychiatric/Behavioral: Positive for sleep disturbance and decreased concentration. Negative for suicidal ideas, behavioral problems, confusion, self-injury and agitation. The patient is nervous/anxious.    Wt Readings from Last 3 Encounters:  10/17/13 172 lb (78.019 kg)  07/18/13 160 lb (72.576 kg)  06/23/13 162 lb (73.483 kg)       Objective:   Physical Exam  Constitutional: She appears well-developed. No distress.  Obese, NAD  HENT:  Head: Normocephalic.  Right Ear: External ear normal.  Left Ear: External ear normal.  Nose: Nose normal.  Mouth/Throat: Oropharynx is clear and moist.  Eyes: Conjunctivae are normal. Pupils are equal, round, and reactive to light. Right eye exhibits no discharge. Left eye exhibits no discharge.  Neck: Normal range of motion. Neck supple. No JVD present. No tracheal deviation present. No thyromegaly present.  Cardiovascular: Normal rate, regular rhythm and normal heart sounds.   Pulmonary/Chest: No  stridor. No respiratory distress. She has no wheezes. She has rales. She exhibits tenderness.  B breasts with fibrocystic changes  Abdominal: Soft. Bowel sounds are normal. She exhibits no distension and no mass. There is tenderness. There is no rebound and no guarding.  LLQ is tender  Musculoskeletal: She exhibits tenderness. She exhibits no edema.  Lymphadenopathy:    She has no cervical adenopathy.  Neurological: She displays normal reflexes. No cranial nerve deficit. She exhibits normal muscle tone. Coordination abnormal.  Better - out of a w/c now  Skin: No rash noted. No erythema.  Psychiatric: She has a normal mood and affect. Her behavior is normal. Judgment and thought content normal.   In a w/c R ankle and R dorsal foot are less tender   Lab Results  Component Value Date   WBC 10.4 06/12/2013   HGB 11.7* 06/12/2013   HCT 36.1 06/12/2013   PLT 265 06/12/2013   GLUCOSE 110* 06/12/2013   CHOL 339* 08/12/2009   TRIG 298.0* 08/12/2009   HDL 43.10 08/12/2009   LDLDIRECT 232.7 08/12/2009   LDLCALC  Value: 240        Total Cholesterol/HDL:CHD Risk Coronary Heart Disease Risk Table                     Men   Women  1/2 Average Risk   3.4   3.3  Average Risk       5.0   4.4  2 X Average Risk   9.6   7.1  3 X Average Risk  23.4   11.0        Use the calculated Patient Ratio above and the CHD Risk Table to determine the patient's  CHD Risk.        ATP III CLASSIFICATION (LDL):  <100     mg/dL   Optimal  100-129  mg/dL   Near or Above                    Optimal  130-159  mg/dL   Borderline  160-189  mg/dL   High  >190     mg/dL   Very High* 05/12/2009   ALT 11 12/02/2012   AST 15 12/02/2012   NA 136 06/12/2013   K 4.8 06/12/2013   CL 103 06/12/2013   CREATININE 0.79 06/12/2013   BUN 16 06/12/2013   CO2 24 06/12/2013   TSH 1.44 01/18/2012   INR 1.0 09/16/2008   HGBA1C  Value: 5.0 (NOTE)   The ADA recommends the following therapeutic goal for glycemic   control related to Hgb A1C measurement:    Goal of Therapy:   < 7.0% Hgb A1C   Reference: American Diabetes Association: Clinical Practice   Recommendations 2008, Diabetes Care,  2008, 31:(Suppl 1). 09/17/2008  wart x1   Procedure Note :     Procedure : Cryosurgery   Indication:  Wart(s)     Risks including unsuccessful procedure , bleeding, infection, bruising, scar, a need for a repeat  procedure and others were explained to the patient in detail as well as the benefits. Informed consent was obtained verbally.    1 lesion(s)  on forehead   was/were treated with liquid nitrogen on a Q-tip in a usual fasion . Band-Aid was applied and antibiotic ointment was given for a later use.   Tolerated well. Complications none.   Postprocedure instructions :     Keep the wounds clean. You can wash them with liquid soap and water. Pat dry with gauze or a Kleenex tissue  Before applying antibiotic ointment and a Band-Aid.   You need to report immediately  if  any signs of infection develop.       Assessment & Plan:

## 2013-10-17 NOTE — Assessment & Plan Note (Signed)
In a w/c - chronic: post CVA, brain mets resection - remote

## 2013-10-17 NOTE — Assessment & Plan Note (Signed)
Better  

## 2013-10-20 LAB — URINALYSIS
Bilirubin Urine: NEGATIVE
Hgb urine dipstick: NEGATIVE
Ketones, ur: NEGATIVE
Leukocytes, UA: NEGATIVE
Nitrite: NEGATIVE
Specific Gravity, Urine: 1.01 (ref 1.000–1.030)
Total Protein, Urine: NEGATIVE
Urine Glucose: NEGATIVE
Urobilinogen, UA: 0.2 (ref 0.0–1.0)
pH: 7 (ref 5.0–8.0)

## 2013-10-30 ENCOUNTER — Ambulatory Visit (INDEPENDENT_AMBULATORY_CARE_PROVIDER_SITE_OTHER)
Admission: RE | Admit: 2013-10-30 | Discharge: 2013-10-30 | Disposition: A | Discharge status: Home / Self Care | Payer: Medicare Other | Source: Ambulatory Visit | Attending: Internal Medicine | Admitting: Internal Medicine

## 2013-10-30 ENCOUNTER — Ambulatory Visit (INDEPENDENT_AMBULATORY_CARE_PROVIDER_SITE_OTHER): Payer: Medicare Other | Admitting: Internal Medicine

## 2013-10-30 ENCOUNTER — Encounter: Payer: Self-pay | Admitting: Internal Medicine

## 2013-10-30 VITALS — BP 114/52 | HR 99 | Temp 96.9°F | Resp 15 | Wt 172.0 lb

## 2013-10-30 DIAGNOSIS — R531 Weakness: Secondary | ICD-10-CM

## 2013-10-30 DIAGNOSIS — Z85118 Personal history of other malignant neoplasm of bronchus and lung: Secondary | ICD-10-CM

## 2013-10-30 DIAGNOSIS — F17200 Nicotine dependence, unspecified, uncomplicated: Secondary | ICD-10-CM

## 2013-10-30 DIAGNOSIS — R5383 Other fatigue: Secondary | ICD-10-CM

## 2013-10-30 DIAGNOSIS — R0989 Other specified symptoms and signs involving the circulatory and respiratory systems: Secondary | ICD-10-CM

## 2013-10-30 DIAGNOSIS — Z87898 Personal history of other specified conditions: Secondary | ICD-10-CM

## 2013-10-30 DIAGNOSIS — R0689 Other abnormalities of breathing: Secondary | ICD-10-CM

## 2013-10-30 DIAGNOSIS — R5381 Other malaise: Secondary | ICD-10-CM

## 2013-10-30 DIAGNOSIS — I951 Orthostatic hypotension: Secondary | ICD-10-CM

## 2013-10-30 NOTE — Progress Notes (Signed)
Pre visit review using our clinic review tool, if applicable. No additional management support is needed unless otherwise documented below in the visit note. 

## 2013-10-30 NOTE — Progress Notes (Signed)
Subjective:    Patient ID: Kaylee Hansen, female    DOB: [birth year], 58 y.o.   MRN: [id number]  HPI   She describes fatigue for which extensive lab studies were done recently. All were normal; TSH was low normal.  Associated symptoms include sweats and a weight gain from 158 prior to the onset of Winter to a present weight of 172  She's had some blurred vision. Occasionally she has some dysphagia  Chronic are cough , sputum production and shortness of breath. She wears oxygen at night which was prescribed by her cancer doctor when she was treated for lung cancer  She describes alternating stools as being loose/diarrheal as well as constipation. This is in the context of narcotic pain medications  She has some diffuse joint pain and swelling  and  Limb weakness especially on the left side in  the context of prior stroke  Balance is extremely poor; she gets lightheaded standing up repeatedly.  She denies any cardiac or neurologic prodrome prior to the falls  Chart review indicates that she has had adrenal insufficiency in the past. There's no recent cortisol on record  Review of Systems   She denies fever or chills  She is not had diplopia or visual loss  Extrinsic symptoms of itchy, watery eyes, sneezing  not present. Also angioedema denied She has no significant hoarseness  There is no melena or rectal bleeding  No new change in hair, skin , or nails  Husband denies excessive snoring or apnea  She has no abnormal bruising but has ecchymoses related to falls.  She has history of anxiety and depression for which she's on medication        Objective:   Physical Exam Significant or distinguishing  findings on physical exam include suggestion of skin pallor. She's adequately nourished but does not appear healthy. She has complete dentures. Chest reveals coarse rhonchi and loose cough. She is profoundly weak when she tries to get on  exam table even with help . There is  weakness in the left lower extremity to opposition. In wheelchair  Head: Normocephalic without obvious abnormalities Eyes: No corneal or conjunctival inflammation noted. No icterus.  Nose: External nasal exam reveals no deformity or inflammation. Nasal mucosa aredry. No lesions or exudates noted.   Mouth: Oral mucosa and oropharynx reveal no lesions or exudates. Neck: No deformities, masses, or tenderness noted. Thyroid normal. Lungs: No  increased work of breathing. Heart: Normal rate and rhythm. Normal S1 and S2. No gallop, click, or rub. No murmur. Abdomen: Bowel sounds normal; abdomen soft and nontender. No masses, organomegaly or hernias noted.                               Musculoskeletal/extremities: No deformity or scoliosis noted of  the thoracic or lumbar spine.  No clubbing, cyanosis, edema, or significant extremity  deformity noted. Strength as noted Hand joints normal  Fingernail  health good.  Vascular: Carotid, radial artery, dorsalis pedis and  posterior tibial pulses are full and equal.  Neurologic: Alert and oriented x3. Deep tendon reflexes symmetrical but reduced       Skin: Intact without suspicious lesions or rashes. Lymph: No cervical, axillary lymphadenopathy present. Psych: Mood and affect are markedly  depressed  Assessment & Plan:  #1 weakness  #2 variable bowel changes  #3 abnormal breath sounds in the context of history of lung cancer and continued smoking  #4 possible postural hypotension  See orders and recommendations

## 2013-10-30 NOTE — Patient Instructions (Signed)
Perform isometric exercise of calves  ( while seated go up on toes to count of 5 & then onto heels for 5 count). Repeat  4- 5 times prior to standing if you've been seated or supine for any significant period of time as BP drops with such positions. Please take a probiotic , Florastor OR Align, every day to keep the bowels normal. This will replace the normal bacteria which  are necessary for formation of normal stool and processing of food.

## 2013-11-03 ENCOUNTER — Other Ambulatory Visit (INDEPENDENT_AMBULATORY_CARE_PROVIDER_SITE_OTHER): Payer: Medicare Other

## 2013-11-03 DIAGNOSIS — R5383 Other fatigue: Secondary | ICD-10-CM

## 2013-11-03 DIAGNOSIS — R531 Weakness: Secondary | ICD-10-CM

## 2013-11-03 DIAGNOSIS — I951 Orthostatic hypotension: Secondary | ICD-10-CM

## 2013-11-03 DIAGNOSIS — Z87898 Personal history of other specified conditions: Secondary | ICD-10-CM

## 2013-11-03 DIAGNOSIS — R5381 Other malaise: Secondary | ICD-10-CM

## 2013-11-03 LAB — CORTISOL: Cortisol, Plasma: 18.4 ug/dL

## 2013-11-13 ENCOUNTER — Encounter: Payer: Self-pay | Admitting: Internal Medicine

## 2014-01-02 ENCOUNTER — Ambulatory Visit (INDEPENDENT_AMBULATORY_CARE_PROVIDER_SITE_OTHER): Payer: Medicare Other | Admitting: Internal Medicine

## 2014-01-02 ENCOUNTER — Other Ambulatory Visit (INDEPENDENT_AMBULATORY_CARE_PROVIDER_SITE_OTHER): Payer: Medicare Other

## 2014-01-02 ENCOUNTER — Other Ambulatory Visit: Payer: Self-pay | Admitting: Internal Medicine

## 2014-01-02 ENCOUNTER — Encounter: Payer: Self-pay | Admitting: Internal Medicine

## 2014-01-02 VITALS — BP 130/84 | HR 100 | Temp 98.3°F | Resp 16 | Wt 176.0 lb

## 2014-01-02 DIAGNOSIS — R270 Ataxia, unspecified: Secondary | ICD-10-CM | POA: Insufficient documentation

## 2014-01-02 DIAGNOSIS — R269 Unspecified abnormalities of gait and mobility: Secondary | ICD-10-CM

## 2014-01-02 DIAGNOSIS — E2749 Other adrenocortical insufficiency: Secondary | ICD-10-CM

## 2014-01-02 DIAGNOSIS — R279 Unspecified lack of coordination: Secondary | ICD-10-CM

## 2014-01-02 DIAGNOSIS — Z8679 Personal history of other diseases of the circulatory system: Secondary | ICD-10-CM

## 2014-01-02 LAB — CBC WITH DIFFERENTIAL/PLATELET
Basophils Absolute: 0.1 10*3/uL (ref 0.0–0.1)
Basophils Relative: 1.2 % (ref 0.0–3.0)
Eosinophils Absolute: 0.3 10*3/uL (ref 0.0–0.7)
Eosinophils Relative: 3.8 % (ref 0.0–5.0)
HCT: 34.9 % — ABNORMAL LOW (ref 36.0–46.0)
Hemoglobin: 11.7 g/dL — ABNORMAL LOW (ref 12.0–15.0)
Lymphocytes Relative: 26 % (ref 12.0–46.0)
Lymphs Abs: 1.9 10*3/uL (ref 0.7–4.0)
MCHC: 33.6 g/dL (ref 30.0–36.0)
MCV: 95.1 fl (ref 78.0–100.0)
Monocytes Absolute: 0.6 10*3/uL (ref 0.1–1.0)
Monocytes Relative: 8.1 % (ref 3.0–12.0)
Neutro Abs: 4.4 10*3/uL (ref 1.4–7.7)
Neutrophils Relative %: 60.9 % (ref 43.0–77.0)
Platelets: 268 10*3/uL (ref 150.0–400.0)
RBC: 3.67 Mil/uL — ABNORMAL LOW (ref 3.87–5.11)
RDW: 13.7 % (ref 11.5–15.5)
WBC: 7.2 10*3/uL (ref 4.0–10.5)

## 2014-01-02 LAB — BASIC METABOLIC PANEL
BUN: 11 mg/dL (ref 6–23)
CO2: 30 mEq/L (ref 19–32)
Calcium: 9.7 mg/dL (ref 8.4–10.5)
Chloride: 100 mEq/L (ref 96–112)
Creatinine, Ser: 1 mg/dL (ref 0.4–1.2)
GFR: 58.74 mL/min — ABNORMAL LOW (ref >60.00)
Glucose, Bld: 98 mg/dL (ref 70–99)
Potassium: 4.5 mEq/L (ref 3.5–5.1)
Sodium: 136 mEq/L (ref 135–145)

## 2014-01-02 LAB — HEPATIC FUNCTION PANEL
ALT: 11 U/L (ref 0–35)
AST: 14 U/L (ref 0–37)
Albumin: 3.5 g/dL (ref 3.5–5.2)
Alkaline Phosphatase: 97 U/L (ref 39–117)
Bilirubin, Direct: 0 mg/dL (ref 0.0–0.3)
Total Bilirubin: 0.3 mg/dL (ref 0.2–1.2)
Total Protein: 6.6 g/dL (ref 6.0–8.3)

## 2014-01-02 LAB — URINALYSIS, ROUTINE W REFLEX MICROSCOPIC
Bilirubin Urine: NEGATIVE
Hgb urine dipstick: NEGATIVE
Ketones, ur: NEGATIVE
Nitrite: POSITIVE — AB
RBC / HPF: NONE SEEN (ref 0–?)
Specific Gravity, Urine: 1.015 (ref 1.000–1.030)
Total Protein, Urine: NEGATIVE
Urine Glucose: NEGATIVE
Urobilinogen, UA: 0.2 (ref 0.0–1.0)
pH: 5.5 (ref 5.0–8.0)

## 2014-01-02 LAB — TSH: TSH: 0.22 u[IU]/mL — ABNORMAL LOW (ref 0.35–4.50)

## 2014-01-02 LAB — CORTISOL: Cortisol, Plasma: 7.3 ug/dL

## 2014-01-02 LAB — SEDIMENTATION RATE: Sed Rate: 41 mm/hr — ABNORMAL HIGH (ref 0–22)

## 2014-01-02 LAB — CK: Total CK: 45 U/L (ref 7–177)

## 2014-01-02 MED ORDER — CIPROFLOXACIN HCL 250 MG PO TABS: 250.0000 mg | ORAL_TABLET | Freq: Two times a day (BID) | ORAL | Status: DC

## 2014-01-02 MED ORDER — TRAZODONE HCL 100 MG PO TABS: 100.0000 mg | ORAL_TABLET | Freq: Every day | ORAL | Status: DC

## 2014-01-02 NOTE — Assessment & Plan Note (Signed)
Worse ataxia: d/c hydroxyzine Reduce Trazodone  CT head Labs, UA

## 2014-01-02 NOTE — Progress Notes (Signed)
Pre visit review using our clinic review tool, if applicable. No additional management support is needed unless otherwise documented below in the visit note. 

## 2014-01-02 NOTE — Assessment & Plan Note (Signed)
7/15 Cortisol=15 Labs

## 2014-01-02 NOTE — Progress Notes (Signed)
HPI  C/o weak legs, unsteady on her feet x 72mo, shaky  Patient is s/p an intra-articular fracture that is nondisplaced of the first proximal phalanx - healed.  F/u COPD, LBP, anxiety, siezures. C/o constipation, cramps   BP Readings from Last 3 Encounters:  01/02/14 130/84  10/30/13 114/52  10/17/13 130/58      Review of Systems  Constitutional: Negative for activity change, appetite change, fatigue and unexpected weight change.  HENT: Negative for congestion, mouth sores, nosebleeds and sinus pressure.   Eyes: Negative for visual disturbance.  Respiratory: Positive for apnea (at night). Negative for chest tightness.   Gastrointestinal: Negative for anal bleeding.  Genitourinary: Negative for difficulty urinating and vaginal pain.  Musculoskeletal: Positive for back pain and gait problem (better).  Skin: Negative for pallor and wound.  Neurological: Positive for dizziness, tremors and weakness. Negative for numbness.  Psychiatric/Behavioral: Positive for sleep disturbance and decreased concentration. Negative for suicidal ideas, behavioral problems, confusion, self-injury and agitation. The patient is nervous/anxious.    Wt Readings from Last 3 Encounters:  01/02/14 176 lb (79.833 kg)  10/30/13 172 lb (78.019 kg)  10/17/13 172 lb (78.019 kg)       Objective:   Physical Exam  Constitutional: She appears well-developed. No distress.  Obese, NAD  HENT:  Head: Normocephalic.  Right Ear: External ear normal.  Left Ear: External ear normal.  Nose: Nose normal.  Mouth/Throat: Oropharynx is clear and moist.  Eyes: Conjunctivae are normal. Pupils are equal, round, and reactive to light. Right eye exhibits no discharge. Left eye exhibits no discharge.  Neck: Normal range of motion. Neck supple. No JVD present. No tracheal deviation present. No thyromegaly present.  Cardiovascular: Normal rate, regular rhythm and normal heart sounds.   Pulmonary/Chest: No stridor. No  respiratory distress. She has no wheezes. She has rales. She exhibits tenderness.  B breasts with fibrocystic changes  Abdominal: Soft. Bowel sounds are normal. She exhibits no distension and no mass. There is tenderness. There is no rebound and no guarding.  LLQ is tender  Musculoskeletal: She exhibits tenderness. She exhibits no edema.  Lymphadenopathy:    She has no cervical adenopathy.  Neurological: She displays normal reflexes. No cranial nerve deficit. She exhibits normal muscle tone. Coordination abnormal.  Skin: No rash noted. No erythema.  Psychiatric: Judgment and thought content normal.  Tremor B Ataxia In a w/c B LE 4/5 MS, UE 5-/5 R ankle and R dorsal foot are less tender   Lab Results  Component Value Date   WBC 8.7 10/17/2013   HGB 12.3 10/17/2013   HCT 36.6 10/17/2013   PLT 276.0 10/17/2013   GLUCOSE 88 10/17/2013   CHOL 339* 08/12/2009   TRIG 298.0* 08/12/2009   HDL 43.10 08/12/2009   LDLDIRECT 232.7 08/12/2009   LDLCALC  Value: 240        Total Cholesterol/HDL:CHD Risk Coronary Heart Disease Risk Table                     Men   Women  1/2 Average Risk   3.4   3.3  Average Risk       5.0   4.4  2 X Average Risk   9.6   7.1  3 X Average Risk  23.4   11.0        Use the calculated Patient Ratio above and the CHD Risk Table to determine the patient's CHD Risk.        ATP III CLASSIFICATION (  LDL):  <100     mg/dL   Optimal  100-129  mg/dL   Near or Above                    Optimal  130-159  mg/dL   Borderline  160-189  mg/dL   High  >190     mg/dL   Very High* 05/12/2009   ALT 11 12/02/2012   AST 15 12/02/2012   NA 138 10/17/2013   K 4.6 10/17/2013   CL 103 10/17/2013   CREATININE 0.9 10/17/2013   BUN 12 10/17/2013   CO2 29 10/17/2013   TSH 0.36 10/17/2013   INR 1.0 09/16/2008   HGBA1C  Value: 5.0 (NOTE)   The ADA recommends the following therapeutic goal for glycemic   control related to Hgb A1C measurement:   Goal of Therapy:   < 7.0% Hgb A1C   Reference: American Diabetes  Association: Clinical Practice   Recommendations 2008, Diabetes Care,  2008, 31:(Suppl 1). 09/17/2008      A complex case Assessment & Plan:

## 2014-01-06 ENCOUNTER — Telehealth: Payer: Self-pay | Admitting: *Deleted

## 2014-01-06 NOTE — Telephone Encounter (Signed)
Requesting labs results that was done on last Friday 01/02/14...Kaylee Hansen

## 2014-01-06 NOTE — Telephone Encounter (Signed)
Labs are OK - reported to the pt last week. Poss UTI: Cipro was emailed.

## 2014-01-06 NOTE — Telephone Encounter (Signed)
Patient notified per MD.

## 2014-01-12 ENCOUNTER — Ambulatory Visit (INDEPENDENT_AMBULATORY_CARE_PROVIDER_SITE_OTHER)
Admission: RE | Admit: 2014-01-12 | Discharge: 2014-01-12 | Disposition: A | Discharge status: Home / Self Care | Payer: Medicare Other | Source: Ambulatory Visit | Attending: Internal Medicine | Admitting: Internal Medicine

## 2014-01-12 DIAGNOSIS — Z8679 Personal history of other diseases of the circulatory system: Secondary | ICD-10-CM

## 2014-01-12 DIAGNOSIS — R269 Unspecified abnormalities of gait and mobility: Secondary | ICD-10-CM

## 2014-01-15 ENCOUNTER — Ambulatory Visit: Payer: Medicare Other | Admitting: Internal Medicine

## 2014-01-29 ENCOUNTER — Encounter: Payer: Self-pay | Admitting: Internal Medicine

## 2014-01-29 ENCOUNTER — Ambulatory Visit (INDEPENDENT_AMBULATORY_CARE_PROVIDER_SITE_OTHER): Payer: Medicare Other | Admitting: Internal Medicine

## 2014-01-29 VITALS — BP 111/65 | HR 81 | Temp 98.0°F | Wt 174.0 lb

## 2014-01-29 DIAGNOSIS — E039 Hypothyroidism, unspecified: Secondary | ICD-10-CM

## 2014-01-29 DIAGNOSIS — F411 Generalized anxiety disorder: Secondary | ICD-10-CM

## 2014-01-29 DIAGNOSIS — R270 Ataxia, unspecified: Secondary | ICD-10-CM

## 2014-01-29 DIAGNOSIS — R279 Unspecified lack of coordination: Secondary | ICD-10-CM

## 2014-01-29 DIAGNOSIS — F3289 Other specified depressive episodes: Secondary | ICD-10-CM

## 2014-01-29 DIAGNOSIS — F329 Major depressive disorder, single episode, unspecified: Secondary | ICD-10-CM

## 2014-01-29 MED ORDER — FENTANYL 12 MCG/HR TD PT72: 12.5000 ug | MEDICATED_PATCH | TRANSDERMAL | Status: DC

## 2014-01-29 MED ORDER — PANTOPRAZOLE SODIUM 40 MG PO TBEC: 40.0000 mg | DELAYED_RELEASE_TABLET | Freq: Every day | ORAL | Status: DC

## 2014-01-29 MED ORDER — HYDROCODONE-ACETAMINOPHEN 10-325 MG PO TABS: 1.0000 | ORAL_TABLET | Freq: Four times a day (QID) | ORAL | Status: DC | PRN

## 2014-01-29 MED ORDER — SUMATRIPTAN SUCCINATE 100 MG PO TABS
100.0000 mg | ORAL_TABLET | ORAL | Status: DC | PRN
Start: 2014-01-29 — End: 2015-08-16

## 2014-01-29 MED ORDER — CLORAZEPATE DIPOTASSIUM 15 MG PO TABS: 15.0000 mg | ORAL_TABLET | Freq: Three times a day (TID) | ORAL | Status: DC | PRN

## 2014-01-29 MED ORDER — PHENYTOIN SODIUM EXTENDED 100 MG PO CAPS: ORAL_CAPSULE | ORAL | Status: DC

## 2014-01-29 MED ORDER — HYDROCODONE-ACETAMINOPHEN 10-325 MG PO TABS
1.0000 | ORAL_TABLET | Freq: Four times a day (QID) | ORAL | Status: DC | PRN
Start: 2014-01-29 — End: 2014-01-29

## 2014-01-29 NOTE — Progress Notes (Signed)
Pre visit review using our clinic review tool, if applicable. No additional management support is needed unless otherwise documented below in the visit note. 

## 2014-01-29 NOTE — Assessment & Plan Note (Signed)
Continue with current prescription therapy as reflected on the Med list.  

## 2014-01-29 NOTE — Progress Notes (Signed)
HPI  F/u weak legs, unsteady on her feet, shaky - better UTI - treated F/u migraines - Maxalt is not covered  Patient is s/p an intra-articular fracture that is nondisplaced of the first proximal phalanx - healed.  F/u COPD, LBP, anxiety, siezures. C/o constipation, cramps   BP Readings from Last 3 Encounters:  01/29/14 111/65  01/02/14 130/84  10/30/13 114/52      Review of Systems  Constitutional: Negative for activity change, appetite change, fatigue and unexpected weight change.  HENT: Negative for congestion, mouth sores, nosebleeds and sinus pressure.   Eyes: Negative for visual disturbance.  Respiratory: Positive for apnea (at night). Negative for chest tightness.   Gastrointestinal: Negative for anal bleeding.  Genitourinary: Negative for difficulty urinating and vaginal pain.  Musculoskeletal: Positive for back pain and gait problem (better).  Skin: Negative for pallor and wound.  Neurological: Positive for dizziness, tremors and weakness. Negative for numbness.  Psychiatric/Behavioral: Positive for sleep disturbance and decreased concentration. Negative for suicidal ideas, behavioral problems, confusion, self-injury and agitation. The patient is nervous/anxious.    Wt Readings from Last 3 Encounters:  01/29/14 174 lb (78.926 kg)  01/02/14 176 lb (79.833 kg)  10/30/13 172 lb (78.019 kg)       Objective:   Physical Exam  Constitutional: She appears well-developed. No distress.  Obese, NAD  HENT:  Head: Normocephalic.  Right Ear: External ear normal.  Left Ear: External ear normal.  Nose: Nose normal.  Mouth/Throat: Oropharynx is clear and moist.  Eyes: Conjunctivae are normal. Pupils are equal, round, and reactive to light. Right eye exhibits no discharge. Left eye exhibits no discharge.  Neck: Normal range of motion. Neck supple. No JVD present. No tracheal deviation present. No thyromegaly present.  Cardiovascular: Normal rate, regular rhythm and normal  heart sounds.   Pulmonary/Chest: No stridor. No respiratory distress. She has no wheezes. She has rales. She exhibits tenderness.  B breasts with fibrocystic changes  Abdominal: Soft. Bowel sounds are normal. She exhibits no distension and no mass. There is tenderness. There is no rebound and no guarding.  LLQ is tender  Musculoskeletal: She exhibits tenderness. She exhibits no edema.  Lymphadenopathy:    She has no cervical adenopathy.  Neurological: She displays normal reflexes. No cranial nerve deficit. She exhibits normal muscle tone. Coordination abnormal.  Skin: No rash noted. No erythema.  Psychiatric: Judgment and thought content normal.  Tremor B Ataxia In a w/c B LE 4/5 MS, UE 5-/5 R ankle and R dorsal foot are less tender   Lab Results  Component Value Date   WBC 7.2 01/02/2014   HGB 11.7* 01/02/2014   HCT 34.9* 01/02/2014   PLT 268.0 01/02/2014   GLUCOSE 98 01/02/2014   CHOL 339* 08/12/2009   TRIG 298.0* 08/12/2009   HDL 43.10 08/12/2009   LDLDIRECT 232.7 08/12/2009   LDLCALC  Value: 240        Total Cholesterol/HDL:CHD Risk Coronary Heart Disease Risk Table                     Men   Women  1/2 Average Risk   3.4   3.3  Average Risk       5.0   4.4  2 X Average Risk   9.6   7.1  3 X Average Risk  23.4   11.0        Use the calculated Patient Ratio above and the CHD Risk Table to determine the patient's CHD Risk.  ATP III CLASSIFICATION (LDL):  <100     mg/dL   Optimal  100-129  mg/dL   Near or Above                    Optimal  130-159  mg/dL   Borderline  160-189  mg/dL   High  >190     mg/dL   Very High* 05/12/2009   ALT 11 01/02/2014   AST 14 01/02/2014   NA 136 01/02/2014   K 4.5 01/02/2014   CL 100 01/02/2014   CREATININE 1.0 01/02/2014   BUN 11 01/02/2014   CO2 30 01/02/2014   TSH 0.22* 01/02/2014   INR 1.0 09/16/2008   HGBA1C  Value: 5.0 (NOTE)   The ADA recommends the following therapeutic goal for glycemic   control related to Hgb A1C measurement:   Goal of Therapy:   <  7.0% Hgb A1C   Reference: American Diabetes Association: Clinical Practice   Recommendations 2008, Diabetes Care,  2008, 31:(Suppl 1). 09/17/2008      Assessment & Plan:

## 2014-01-29 NOTE — Assessment & Plan Note (Signed)
W/c bound

## 2014-02-02 ENCOUNTER — Encounter: Payer: Self-pay | Admitting: Gastroenterology

## 2014-02-02 ENCOUNTER — Encounter: Payer: Self-pay | Admitting: Internal Medicine

## 2014-02-24 ENCOUNTER — Other Ambulatory Visit: Payer: Self-pay | Admitting: Internal Medicine

## 2014-03-18 ENCOUNTER — Ambulatory Visit (INDEPENDENT_AMBULATORY_CARE_PROVIDER_SITE_OTHER): Payer: Medicare Other | Admitting: Internal Medicine

## 2014-03-18 ENCOUNTER — Other Ambulatory Visit (INDEPENDENT_AMBULATORY_CARE_PROVIDER_SITE_OTHER): Payer: Medicare Other

## 2014-03-18 ENCOUNTER — Encounter: Payer: Self-pay | Admitting: Internal Medicine

## 2014-03-18 VITALS — BP 140/62 | HR 72 | Temp 97.9°F | Resp 16 | Wt 173.0 lb

## 2014-03-18 DIAGNOSIS — N12 Tubulo-interstitial nephritis, not specified as acute or chronic: Secondary | ICD-10-CM

## 2014-03-18 DIAGNOSIS — D485 Neoplasm of uncertain behavior of skin: Secondary | ICD-10-CM

## 2014-03-18 LAB — URINALYSIS
Bilirubin Urine: NEGATIVE
Hgb urine dipstick: NEGATIVE
Ketones, ur: NEGATIVE
Leukocytes, UA: NEGATIVE
Nitrite: NEGATIVE
Specific Gravity, Urine: 1.01 (ref 1.000–1.030)
Total Protein, Urine: NEGATIVE
Urine Glucose: NEGATIVE
Urobilinogen, UA: 0.2 (ref 0.0–1.0)
pH: 6.5 (ref 5.0–8.0)

## 2014-03-18 MED ORDER — NALOXEGOL OXALATE 25 MG PO TABS: 1.0000 | ORAL_TABLET | Freq: Every day | ORAL | Status: DC

## 2014-03-18 MED ORDER — CIPROFLOXACIN HCL 500 MG PO TABS: 500.0000 mg | ORAL_TABLET | Freq: Two times a day (BID) | ORAL | Status: DC

## 2014-03-18 NOTE — Progress Notes (Signed)
Pre visit review using our clinic review tool, if applicable. No additional management support is needed unless otherwise documented below in the visit note. 

## 2014-03-18 NOTE — Progress Notes (Signed)
Patient ID: Kaylee Hansen, female   DOB: 29-May-1956, 58 y.o.   MRN: [id number]  HPI  C/o urinary urgency x 2 weeks  F/u weak legs, unsteady on her feet, shaky - better UTI - treated F/u migraines - Maxalt is not covered  Patient is s/p an intra-articular fracture that is nondisplaced of the first proximal phalanx - healed.  F/u COPD, LBP, anxiety, siezures. C/o constipation, cramps   BP Readings from Last 3 Encounters:  03/18/14 140/62  01/29/14 111/65  01/02/14 130/84      Review of Systems  Constitutional: Negative for activity change, appetite change, fatigue and unexpected weight change.  HENT: Negative for congestion, mouth sores, nosebleeds and sinus pressure.   Eyes: Negative for visual disturbance.  Respiratory: Positive for apnea (at night). Negative for chest tightness.   Gastrointestinal: Negative for anal bleeding.  Genitourinary: Negative for difficulty urinating and vaginal pain.  Musculoskeletal: Positive for back pain and gait problem (better).  Skin: Negative for pallor and wound.  Neurological: Positive for dizziness, tremors and weakness. Negative for numbness.  Psychiatric/Behavioral: Positive for sleep disturbance and decreased concentration. Negative for suicidal ideas, behavioral problems, confusion, self-injury and agitation. The patient is nervous/anxious.    Wt Readings from Last 3 Encounters:  03/18/14 173 lb (78.472 kg)  01/29/14 174 lb (78.926 kg)  01/02/14 176 lb (79.833 kg)       Objective:   Physical Exam  Constitutional: She appears well-developed. No distress.  Obese, NAD  HENT:  Head: Normocephalic.  Right Ear: External ear normal.  Left Ear: External ear normal.  Nose: Nose normal.  Mouth/Throat: Oropharynx is clear and moist.  Eyes: Conjunctivae are normal. Pupils are equal, round, and reactive to light. Right eye exhibits no discharge. Left eye exhibits no discharge.  Neck: Normal range of motion. Neck supple. No JVD present.  No tracheal deviation present. No thyromegaly present.  Cardiovascular: Normal rate, regular rhythm and normal heart sounds.   Pulmonary/Chest: No stridor. No respiratory distress. She has no wheezes. She has rales. She exhibits tenderness.  B breasts with fibrocystic changes  Abdominal: Soft. Bowel sounds are normal. She exhibits no distension and no mass. There is tenderness. There is no rebound and no guarding.  LLQ is tender  Musculoskeletal: She exhibits tenderness. She exhibits no edema.  Lymphadenopathy:    She has no cervical adenopathy.  Neurological: She displays normal reflexes. No cranial nerve deficit. She exhibits normal muscle tone. Coordination abnormal.  Skin: No rash noted. No erythema.  Psychiatric: Judgment and thought content normal.  Tremor B Ataxia In a w/c B LE 4/5 MS, UE 5-/5 R ankle and R dorsal foot are less tender   Lab Results  Component Value Date   WBC 7.2 01/02/2014   HGB 11.7* 01/02/2014   HCT 34.9* 01/02/2014   PLT 268.0 01/02/2014   GLUCOSE 98 01/02/2014   CHOL 339* 08/12/2009   TRIG 298.0* 08/12/2009   HDL 43.10 08/12/2009   LDLDIRECT 232.7 08/12/2009   LDLCALC  Value: 240        Total Cholesterol/HDL:CHD Risk Coronary Heart Disease Risk Table                     Men   Women  1/2 Average Risk   3.4   3.3  Average Risk       5.0   4.4  2 X Average Risk   9.6   7.1  3 X Average Risk  23.4   11.0  Use the calculated Patient Ratio above and the CHD Risk Table to determine the patient's CHD Risk.        ATP III CLASSIFICATION (LDL):  <100     mg/dL   Optimal  100-129  mg/dL   Near or Above                    Optimal  130-159  mg/dL   Borderline  160-189  mg/dL   High  >190     mg/dL   Very High* 05/12/2009   ALT 11 01/02/2014   AST 14 01/02/2014   NA 136 01/02/2014   K 4.5 01/02/2014   CL 100 01/02/2014   CREATININE 1.0 01/02/2014   BUN 11 01/02/2014   CO2 30 01/02/2014   TSH 0.22* 01/02/2014   INR 1.0 09/16/2008   HGBA1C  Value: 5.0 (NOTE)   The ADA  recommends the following therapeutic goal for glycemic   control related to Hgb A1C measurement:   Goal of Therapy:   < 7.0% Hgb A1C   Reference: American Diabetes Association: Clinical Practice   Recommendations 2008, Diabetes Care,  2008, 31:(Suppl 1). 09/17/2008      Assessment & Plan:

## 2014-03-18 NOTE — Assessment & Plan Note (Signed)
  Posterior chest  x3  Skin bx

## 2014-03-18 NOTE — Assessment & Plan Note (Addendum)
UA Cipro x 10 d

## 2014-05-01 ENCOUNTER — Ambulatory Visit (INDEPENDENT_AMBULATORY_CARE_PROVIDER_SITE_OTHER): Payer: Medicare Other | Admitting: Internal Medicine

## 2014-05-01 ENCOUNTER — Encounter: Payer: Self-pay | Admitting: Internal Medicine

## 2014-05-01 VITALS — BP 110/50 | HR 79 | Temp 98.2°F | Wt 176.0 lb

## 2014-05-01 DIAGNOSIS — F411 Generalized anxiety disorder: Secondary | ICD-10-CM

## 2014-05-01 DIAGNOSIS — G9332 Myalgic encephalomyelitis/chronic fatigue syndrome: Secondary | ICD-10-CM

## 2014-05-01 DIAGNOSIS — Z23 Encounter for immunization: Secondary | ICD-10-CM

## 2014-05-01 DIAGNOSIS — F321 Major depressive disorder, single episode, moderate: Secondary | ICD-10-CM

## 2014-05-01 DIAGNOSIS — R5382 Chronic fatigue, unspecified: Secondary | ICD-10-CM

## 2014-05-01 DIAGNOSIS — J441 Chronic obstructive pulmonary disease with (acute) exacerbation: Secondary | ICD-10-CM

## 2014-05-01 DIAGNOSIS — D485 Neoplasm of uncertain behavior of skin: Secondary | ICD-10-CM

## 2014-05-01 DIAGNOSIS — E034 Atrophy of thyroid (acquired): Secondary | ICD-10-CM

## 2014-05-01 MED ORDER — FENTANYL 12 MCG/HR TD PT72: 12.5000 ug | MEDICATED_PATCH | TRANSDERMAL | Status: DC

## 2014-05-01 MED ORDER — HYDROCODONE-ACETAMINOPHEN 10-325 MG PO TABS: 1.0000 | ORAL_TABLET | Freq: Four times a day (QID) | ORAL | Status: DC | PRN

## 2014-05-01 MED ORDER — KETOCONAZOLE 2 % EX CREA: TOPICAL_CREAM | CUTANEOUS | Status: DC

## 2014-05-01 MED ORDER — NALOXEGOL OXALATE 25 MG PO TABS: 1.0000 | ORAL_TABLET | Freq: Every day | ORAL | Status: DC

## 2014-05-01 MED ORDER — METHYLPREDNISOLONE ACETATE 80 MG/ML IJ SUSP
80.0000 mg | Freq: Once | INTRAMUSCULAR | Status: AC
  Administered 2014-05-01: 80 mg via INTRAMUSCULAR

## 2014-05-01 NOTE — Progress Notes (Signed)
Pre visit review using our clinic review tool, if applicable. No additional management support is needed unless otherwise documented below in the visit note. 

## 2014-05-03 ENCOUNTER — Encounter: Payer: Self-pay | Admitting: Internal Medicine

## 2014-05-03 NOTE — Assessment & Plan Note (Signed)
Reschedule skin bx

## 2014-05-03 NOTE — Assessment & Plan Note (Signed)
Continue with current prescription therapy as reflected on the Med list.  

## 2014-05-03 NOTE — Assessment & Plan Note (Signed)
CFS post-cancer treatment

## 2014-05-03 NOTE — Assessment & Plan Note (Signed)
Continue with current prn prescription therapy as reflected on the Med list.  

## 2014-07-09 ENCOUNTER — Ambulatory Visit (INDEPENDENT_AMBULATORY_CARE_PROVIDER_SITE_OTHER)
Admission: RE | Admit: 2014-07-09 | Discharge: 2014-07-09 | Disposition: A | Discharge status: Home / Self Care | Payer: Medicare Other | Source: Ambulatory Visit | Attending: Internal Medicine | Admitting: Internal Medicine

## 2014-07-09 ENCOUNTER — Ambulatory Visit (INDEPENDENT_AMBULATORY_CARE_PROVIDER_SITE_OTHER): Payer: Medicare Other | Admitting: Internal Medicine

## 2014-07-09 ENCOUNTER — Encounter: Payer: Self-pay | Admitting: Internal Medicine

## 2014-07-09 VITALS — BP 114/50 | HR 103 | Temp 97.9°F | Ht 65.0 in | Wt 177.5 lb

## 2014-07-09 DIAGNOSIS — R0789 Other chest pain: Secondary | ICD-10-CM

## 2014-07-09 DIAGNOSIS — S299XXA Unspecified injury of thorax, initial encounter: Secondary | ICD-10-CM

## 2014-07-09 DIAGNOSIS — S298XXA Other specified injuries of thorax, initial encounter: Secondary | ICD-10-CM

## 2014-07-09 NOTE — Patient Instructions (Signed)
Use an anti-inflammatory cream such as Aspercreme or Zostrix cream twice a day to the affected area as needed. In lieu of this warm moist compresses or  hot water bottle can be used. Do not apply ice .  Your next office appointment will be determined based upon review of your pending x-rays. Those instructions will be transmitted to you through My Chart  OR  by mail;whichever process is your choice to receive results & recommendations . Followup as needed for your acute issue. Please report any significant change in your symptoms.  Please think about quitting smoking. Review the risks we discussed. Please call 1-800-QUIT-NOW [phone]) for free smoking cessation counseling. Oh  Please stay on the oxygen 24 hours a day until your symptoms resolve

## 2014-07-09 NOTE — Progress Notes (Signed)
Pre visit review using our clinic review tool, if applicable. No additional management support is needed unless otherwise documented below in the visit note. 
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
## 2014-08-05 ENCOUNTER — Ambulatory Visit: Payer: Medicare Other | Admitting: Internal Medicine

## 2014-08-06 ENCOUNTER — Encounter: Payer: Self-pay | Admitting: Internal Medicine

## 2014-08-06 ENCOUNTER — Ambulatory Visit (INDEPENDENT_AMBULATORY_CARE_PROVIDER_SITE_OTHER): Payer: Medicare Other | Admitting: Internal Medicine

## 2014-08-06 VITALS — BP 120/60 | HR 85 | Temp 97.9°F | Wt 181.0 lb

## 2014-08-06 DIAGNOSIS — K2100 Gastro-esophageal reflux disease with esophagitis, without bleeding: Secondary | ICD-10-CM

## 2014-08-06 DIAGNOSIS — F411 Generalized anxiety disorder: Secondary | ICD-10-CM

## 2014-08-06 DIAGNOSIS — D489 Neoplasm of uncertain behavior, unspecified: Secondary | ICD-10-CM

## 2014-08-06 DIAGNOSIS — D485 Neoplasm of uncertain behavior of skin: Secondary | ICD-10-CM

## 2014-08-06 DIAGNOSIS — I10 Essential (primary) hypertension: Secondary | ICD-10-CM

## 2014-08-06 MED ORDER — FENTANYL 12 MCG/HR TD PT72: 12.5000 ug | MEDICATED_PATCH | TRANSDERMAL | Status: DC

## 2014-08-06 MED ORDER — PANTOPRAZOLE SODIUM 40 MG PO TBEC: 40.0000 mg | DELAYED_RELEASE_TABLET | Freq: Every day | ORAL | Status: DC

## 2014-08-06 MED ORDER — HYDROCODONE-ACETAMINOPHEN 10-325 MG PO TABS: 1.0000 | ORAL_TABLET | Freq: Four times a day (QID) | ORAL | Status: DC | PRN

## 2014-08-06 MED ORDER — ALPRAZOLAM 0.25 MG PO TABS: 0.2500 mg | ORAL_TABLET | Freq: Three times a day (TID) | ORAL | Status: DC | PRN

## 2014-08-06 MED ORDER — LEVOTHYROXINE SODIUM 150 MCG PO TABS: 150.0000 ug | ORAL_TABLET | Freq: Every day | ORAL | Status: DC

## 2014-08-06 MED ORDER — CALCITRIOL 0.25 MCG PO CAPS: 0.2500 ug | ORAL_CAPSULE | Freq: Every day | ORAL | Status: DC

## 2014-08-06 MED ORDER — TRAZODONE HCL 100 MG PO TABS: 100.0000 mg | ORAL_TABLET | Freq: Every day | ORAL | Status: DC

## 2014-08-06 NOTE — Progress Notes (Signed)
Pre visit review using our clinic review tool, if applicable. No additional management support is needed unless otherwise documented below in the visit note. 

## 2014-08-06 NOTE — Assessment & Plan Note (Signed)
See Rx 

## 2014-08-06 NOTE — Progress Notes (Addendum)
HPI  F/u anxiety: insurance req a change from chlorazepate to alprazolam. Skin bx     BP Readings from Last 3 Encounters:  08/06/14 120/60  07/09/14 114/50  05/01/14 110/50      Review of Systems  Constitutional: Negative for activity change, appetite change, fatigue and unexpected weight change.  HENT: Negative for congestion, mouth sores, nosebleeds and sinus pressure.   Eyes: Negative for visual disturbance.  Respiratory: Positive for apnea (at night). Negative for chest tightness.   Gastrointestinal: Negative for anal bleeding.  Genitourinary: Negative for difficulty urinating and vaginal pain.  Musculoskeletal: Positive for back pain and gait problem (better).  Skin: Negative for pallor and wound.  Neurological: Positive for dizziness, tremors and weakness. Negative for numbness.  Psychiatric/Behavioral: Positive for sleep disturbance and decreased concentration. Negative for suicidal ideas, behavioral problems, confusion, self-injury and agitation. The patient is nervous/anxious.    Wt Readings from Last 3 Encounters:  08/06/14 181 lb (82.101 kg)  07/09/14 177 lb 8 oz (80.513 kg)  05/01/14 176 lb (79.833 kg)       Objective:   Physical Exam  Constitutional: She appears well-developed. No distress.  Obese, NAD  HENT:  Head: Normocephalic.  Right Ear: External ear normal.  Left Ear: External ear normal.  Nose: Nose normal.  Mouth/Throat: Oropharynx is clear and moist.  Eyes: Conjunctivae are normal. Pupils are equal, round, and reactive to light. Right eye exhibits no discharge. Left eye exhibits no discharge.  Neck: Normal range of motion. Neck supple. No JVD present. No tracheal deviation present. No thyromegaly present.  Cardiovascular: Normal rate, regular rhythm and normal heart sounds.   Pulmonary/Chest: No stridor. No respiratory distress. She has no wheezes. She has rales. She exhibits tenderness.  B breasts with fibrocystic changes  Abdominal: Soft.  Bowel sounds are normal. She exhibits no distension and no mass. There is tenderness. There is no rebound and no guarding.  LLQ is tender  Musculoskeletal: She exhibits tenderness. She exhibits no edema.  Lymphadenopathy:    She has no cervical adenopathy.  Neurological: She displays normal reflexes. No cranial nerve deficit. She exhibits normal muscle tone. Coordination abnormal.  Skin: No rash noted. No erythema.  Psychiatric: Judgment and thought content normal.  Tremor B Ataxia In a w/c B LE 4/5 MS, UE 5-/5 R ankle and R dorsal foot are less tender  Moles on back   Lab Results  Component Value Date   WBC 7.2 01/02/2014   HGB 11.7* 01/02/2014   HCT 34.9* 01/02/2014   PLT 268.0 01/02/2014   GLUCOSE 98 01/02/2014   CHOL 339* 08/12/2009   TRIG 298.0* 08/12/2009   HDL 43.10 08/12/2009   LDLDIRECT 232.7 08/12/2009   LDLCALC * 05/12/2009    240        Total Cholesterol/HDL:CHD Risk Coronary Heart Disease Risk Table                     Men   Women  1/2 Average Risk   3.4   3.3  Average Risk       5.0   4.4  2 X Average Risk   9.6   7.1  3 X Average Risk  23.4   11.0        Use the calculated Patient Ratio above and the CHD Risk Table to determine the patient's CHD Risk.        ATP III CLASSIFICATION (LDL):  <100     mg/dL   Optimal  100-129  mg/dL   Near or Above                    Optimal  130-159  mg/dL   Borderline  160-189  mg/dL   High  >190     mg/dL   Very High   ALT 11 01/02/2014   AST 14 01/02/2014   NA 136 01/02/2014   K 4.5 01/02/2014   CL 100 01/02/2014   CREATININE 1.0 01/02/2014   BUN 11 01/02/2014   CO2 30 01/02/2014   TSH 0.22* 01/02/2014   INR 1.0 09/16/2008   HGBA1C  09/17/2008    5.0 (NOTE)   The ADA recommends the following therapeutic goal for glycemic   control related to Hgb A1C measurement:   Goal of Therapy:   < 7.0% Hgb A1C   Reference: American Diabetes Association: Clinical Practice   Recommendations 2008, Diabetes Care,  2008,  31:(Suppl 1).      Procedure Note :     Procedure :  Skin biopsy   Indication:  Changing mole (s ),  Suspicious lesion(s)   Risks including unsuccessful procedure , bleeding, infection, bruising, scar, a need for another complete procedure and others were explained to the patient in detail as well as the benefits. Informed consent was obtained and signed.   The patient was placed in a decubitus position.  Lesion #1 on  R shoulder blade   measuring 11x7    mm   Skin over lesion #1  was prepped with Betadine and alcohol  and anesthetized with 1 cc of 2% lidocaine and epinephrine, using a 25-gauge 1 inch needle.  Shave biopsy with a sterile Dermablade was carried out in the usual fashion. Hyfrecator was used to destroy the rest of the lesion potentially left behind and for hemostasis. Band-Aid was applied with antibiotic ointment.    Lesion #2 on L posterior trap    measuring 6x3  mm   Skin over lesion #2  was prepped with Betadine and alcohol  and anesthetized with 1 cc of 2% lidocaine and epinephrine, using a 25-gauge 1 inch needle.  Shave biopsy with a sterile Dermablade was carried out in the usual fashion. Hyfrecator was used to destroy the rest of the lesion potentially left behind and for hemostasis. Band-Aid was applied with antibiotic ointment.  Lesion #3 on L shoulder blade   measuring 3x2  mm   Skin over lesion #3  was prepped with Betadine and alcohol  and anesthetized with 1 cc of 2% lidocaine and epinephrine, using a 25-gauge 1 inch needle.  Shave biopsy with a sterile Dermablade was carried out in the usual fashion. Hyfrecator was used to destroy the rest of the lesion potentially left behind and for hemostasis. Band-Aid was applied with antibiotic ointment.   Tolerated well. Complications none.       Assessment & Plan:

## 2014-08-06 NOTE — Patient Instructions (Signed)
Postprocedure instructions :    A Band-Aid should be  changed twice daily. You can take a shower tomorrow.  Keep the wounds clean. You can wash them with liquid soap and water. Pat dry with gauze or a Kleenex tissue  Before applying antibiotic ointment and a Band-Aid.   You need to report immediately  if fever, chills or any signs of infection develop.    The biopsy results should be available in 1 -2 weeks. 

## 2014-08-06 NOTE — Assessment & Plan Note (Signed)
Continue with current prescription therapy as reflected on the Med list.  

## 2014-08-07 ENCOUNTER — Telehealth: Payer: Self-pay | Admitting: Internal Medicine

## 2014-08-07 NOTE — Telephone Encounter (Signed)
emmi emailed °

## 2014-08-09 DIAGNOSIS — F411 Generalized anxiety disorder: Secondary | ICD-10-CM | POA: Insufficient documentation

## 2014-08-09 NOTE — Assessment & Plan Note (Signed)
Pt's insurance req a change from chlorazepate to alprazolam - will change   Potential benefits of a long term benzodiazepines  use as well as potential risks  and complications were explained to the patient and were aknowledged.

## 2014-08-09 NOTE — Addendum Note (Signed)
Addended by: Cassandria Anger on: 08/09/2014 09:19 PM   Modules accepted: Level of Service

## 2014-10-05 ENCOUNTER — Telehealth: Payer: Self-pay | Admitting: Internal Medicine

## 2014-10-05 ENCOUNTER — Encounter: Payer: Self-pay | Admitting: Internal Medicine

## 2014-10-05 ENCOUNTER — Ambulatory Visit (INDEPENDENT_AMBULATORY_CARE_PROVIDER_SITE_OTHER): Payer: Medicare Other | Admitting: Internal Medicine

## 2014-10-05 VITALS — BP 140/54 | HR 89 | Wt 181.0 lb

## 2014-10-05 DIAGNOSIS — M5441 Lumbago with sciatica, right side: Secondary | ICD-10-CM

## 2014-10-05 DIAGNOSIS — E034 Atrophy of thyroid (acquired): Secondary | ICD-10-CM

## 2014-10-05 DIAGNOSIS — K219 Gastro-esophageal reflux disease without esophagitis: Secondary | ICD-10-CM

## 2014-10-05 DIAGNOSIS — M5442 Lumbago with sciatica, left side: Secondary | ICD-10-CM

## 2014-10-05 DIAGNOSIS — E038 Other specified hypothyroidism: Secondary | ICD-10-CM

## 2014-10-05 DIAGNOSIS — I10 Essential (primary) hypertension: Secondary | ICD-10-CM | POA: Diagnosis not present

## 2014-10-05 DIAGNOSIS — K5901 Slow transit constipation: Secondary | ICD-10-CM | POA: Diagnosis not present

## 2014-10-05 DIAGNOSIS — G43109 Migraine with aura, not intractable, without status migrainosus: Secondary | ICD-10-CM | POA: Diagnosis not present

## 2014-10-05 MED ORDER — PROMETHAZINE HCL 25 MG PO TABS: 25.0000 mg | ORAL_TABLET | Freq: Every day | ORAL | Status: DC

## 2014-10-05 MED ORDER — PAROXETINE HCL 40 MG PO TABS: ORAL_TABLET | ORAL | Status: DC

## 2014-10-05 MED ORDER — HYDROCODONE-ACETAMINOPHEN 10-325 MG PO TABS: 1.0000 | ORAL_TABLET | Freq: Four times a day (QID) | ORAL | Status: DC | PRN

## 2014-10-05 MED ORDER — TRAZODONE HCL 100 MG PO TABS: 100.0000 mg | ORAL_TABLET | Freq: Every day | ORAL | Status: DC

## 2014-10-05 NOTE — Assessment & Plan Note (Signed)
On NAS diet

## 2014-10-05 NOTE — Progress Notes (Signed)
HPI   F/u weak legs, unsteady on her feet, shaky - no change. Kaylee Hansen has colon ca - 2016 UTI - treated F/u migraines - Maxalt is not covered  Patient is s/p an intra-articular fracture that is nondisplaced of the first proximal phalanx - healed.  F/u COPD, LBP, anxiety, siezures. F/u constipation, cramps   BP Readings from Last 3 Encounters:  10/05/14 140/54  08/06/14 120/60  07/09/14 114/50    Review of Systems  Constitutional: Negative for activity change, appetite change, fatigue and unexpected weight change.  HENT: Negative for congestion, mouth sores, nosebleeds and sinus pressure.   Eyes: Negative for visual disturbance.  Respiratory: Positive for apnea (at night). Negative for chest tightness.   Gastrointestinal: Negative for anal bleeding.  Genitourinary: Negative for difficulty urinating and vaginal pain.  Musculoskeletal: Positive for back pain and gait problem (better).  Skin: Negative for pallor and wound.  Neurological: Positive for dizziness, tremors and weakness. Negative for numbness.  Psychiatric/Behavioral: Positive for sleep disturbance and decreased concentration. Negative for suicidal ideas, behavioral problems, confusion, self-injury and agitation. The patient is nervous/anxious.    Wt Readings from Last 3 Encounters:  10/05/14 181 lb (82.101 kg)  08/06/14 181 lb (82.101 kg)  07/09/14 177 lb 8 oz (80.513 kg)       Objective:   Physical Exam  Constitutional: She appears well-developed. No distress.  Obese, NAD  HENT:  Head: Normocephalic.  Right Ear: External ear normal.  Left Ear: External ear normal.  Nose: Nose normal.  Mouth/Throat: Oropharynx is clear and moist.  Eyes: Conjunctivae are normal. Pupils are equal, round, and reactive to light. Right eye exhibits no discharge. Left eye exhibits no discharge.  Neck: Normal range of motion. Neck supple. No JVD present. No tracheal deviation present. No thyromegaly present.  Cardiovascular: Normal  rate, regular rhythm and normal heart sounds.   Pulmonary/Chest: No stridor. No respiratory distress. She has no wheezes. She has rales. She exhibits tenderness.  B breasts with fibrocystic changes  Abdominal: Soft. Bowel sounds are normal. She exhibits no distension and no mass. There is tenderness. There is no rebound and no guarding.  LLQ is tender  Musculoskeletal: She exhibits tenderness. She exhibits no edema.  Lymphadenopathy:    She has no cervical adenopathy.  Neurological: She displays normal reflexes. No cranial nerve deficit. She exhibits normal muscle tone. Coordination abnormal.  Skin: No rash noted. No erythema.  Psychiatric: Judgment and thought content normal.  Tremor B Ataxia In a w/c B LE 4/5 MS, UE 5-/5 R ankle and R dorsal foot are less tender   Lab Results  Component Value Date   WBC 7.2 01/02/2014   HGB 11.7* 01/02/2014   HCT 34.9* 01/02/2014   PLT 268.0 01/02/2014   GLUCOSE 98 01/02/2014   CHOL 339* 08/12/2009   TRIG 298.0* 08/12/2009   HDL 43.10 08/12/2009   LDLDIRECT 232.7 08/12/2009   LDLCALC * 05/12/2009    240        Total Cholesterol/HDL:CHD Risk Coronary Heart Disease Risk Table                     Men   Women  1/2 Average Risk   3.4   3.3  Average Risk       5.0   4.4  2 X Average Risk   9.6   7.1  3 X Average Risk  23.4   11.0        Use the calculated Patient Ratio above  and the CHD Risk Table to determine the patient's CHD Risk.        ATP III CLASSIFICATION (LDL):  <100     mg/dL   Optimal  100-129  mg/dL   Near or Above                    Optimal  130-159  mg/dL   Borderline  160-189  mg/dL   High  >190     mg/dL   Very High   ALT 11 01/02/2014   AST 14 01/02/2014   NA 136 01/02/2014   K 4.5 01/02/2014   CL 100 01/02/2014   CREATININE 1.0 01/02/2014   BUN 11 01/02/2014   CO2 30 01/02/2014   TSH 0.22* 01/02/2014   INR 1.0 09/16/2008   HGBA1C  09/17/2008    5.0 (NOTE)   The ADA recommends the following therapeutic goal  for glycemic   control related to Hgb A1C measurement:   Goal of Therapy:   < 7.0% Hgb A1C   Reference: American Diabetes Association: Clinical Practice   Recommendations 2008, Diabetes Care,  2008, 31:(Suppl 1).      Assessment & Plan:

## 2014-10-05 NOTE — Progress Notes (Signed)
Pre visit review using our clinic review tool, if applicable. No additional management support is needed unless otherwise documented below in the visit note. 

## 2014-10-05 NOTE — Assessment & Plan Note (Signed)
Chronic Norco, Fentanyl  Potential benefits of a long term opioids use as well as potential risks (i.e. addiction risk, apnea etc) and complications (i.e. Somnolence, constipation and others) were explained to the patient and were aknowledged.

## 2014-10-05 NOTE — Assessment & Plan Note (Signed)
Miralax prn 

## 2014-10-05 NOTE — Telephone Encounter (Signed)
Patient needs refill for promethazine (PHENERGAN) 25 MG tablet [[id number]. Pharmacy [facility] [address] [place]

## 2014-10-05 NOTE — Assessment & Plan Note (Signed)
Severe headache x 2 d, intractible Imitrex prn

## 2014-10-05 NOTE — Assessment & Plan Note (Signed)
Protonix Rx

## 2014-10-05 NOTE — Assessment & Plan Note (Signed)
On Levothroid 

## 2014-10-05 NOTE — Telephone Encounter (Signed)
Done- see meds

## 2014-11-24 LAB — HM MAMMOGRAPHY

## 2014-11-26 ENCOUNTER — Encounter: Payer: Self-pay | Admitting: Internal Medicine

## 2014-12-10 ENCOUNTER — Encounter: Payer: Self-pay | Admitting: Internal Medicine

## 2014-12-10 ENCOUNTER — Ambulatory Visit (INDEPENDENT_AMBULATORY_CARE_PROVIDER_SITE_OTHER): Payer: Medicare Other | Admitting: Internal Medicine

## 2014-12-10 VITALS — BP 120/54 | HR 83 | Wt 174.0 lb

## 2014-12-10 DIAGNOSIS — Z8679 Personal history of other diseases of the circulatory system: Secondary | ICD-10-CM

## 2014-12-10 DIAGNOSIS — R270 Ataxia, unspecified: Secondary | ICD-10-CM

## 2014-12-10 DIAGNOSIS — M5440 Lumbago with sciatica, unspecified side: Secondary | ICD-10-CM | POA: Diagnosis not present

## 2014-12-10 DIAGNOSIS — R269 Unspecified abnormalities of gait and mobility: Secondary | ICD-10-CM | POA: Diagnosis not present

## 2014-12-10 DIAGNOSIS — Z993 Dependence on wheelchair: Secondary | ICD-10-CM

## 2014-12-10 MED ORDER — FENTANYL 12 MCG/HR TD PT72: 12.5000 ug | MEDICATED_PATCH | TRANSDERMAL | Status: DC

## 2014-12-10 MED ORDER — HYDROCODONE-ACETAMINOPHEN 10-325 MG PO TABS: 1.0000 | ORAL_TABLET | Freq: Four times a day (QID) | ORAL | Status: DC | PRN

## 2014-12-10 MED ORDER — KETOCONAZOLE 2 % EX CREA: TOPICAL_CREAM | CUTANEOUS | Status: DC

## 2014-12-10 NOTE — Assessment & Plan Note (Signed)
12/10/14: Kaylee Hansen was evaluated for a new electric w/c (she has been using one now for >5 years). She needs a power w/c.

## 2014-12-10 NOTE — Progress Notes (Signed)
HPI  Kaylee Hansen is here to be evaluated for a new electric (power) w/c (she has been using one for >5 years; power w/c dependent)  F/u weak legs, unsteady on her feet, shaky - no change. Kaylee Hansen has colon ca - 2016 UTI - treated F/u migraines - [employer] is not covered  Patient is s/p an intra-articular fracture that is nondisplaced of the first proximal phalanx - healed.  F/u COPD, LBP, anxiety, siezures. F/u constipation, cramps   BP Readings from Last 3 Encounters:  12/10/14 120/54  10/05/14 140/54  08/06/14 120/60    Review of Systems  Constitutional: Negative for activity change, appetite change, fatigue and unexpected weight change.  HENT: Negative for congestion, mouth sores, nosebleeds and sinus pressure.   Eyes: Negative for visual disturbance.  Respiratory: Positive for apnea (at night). Negative for chest tightness.   Gastrointestinal: Negative for anal bleeding.  Genitourinary: Negative for difficulty urinating and vaginal pain.  Musculoskeletal: Positive for back pain and gait problem (better).  Skin: Negative for pallor and wound.  Neurological: Positive for dizziness, tremors and weakness. Negative for numbness.  Psychiatric/Behavioral: Positive for sleep disturbance and decreased concentration. Negative for suicidal ideas, behavioral problems, confusion, self-injury and agitation. The patient is nervous/anxious.    Wt Readings from Last 3 Encounters:  12/10/14 174 lb (78.926 kg)  10/05/14 181 lb (82.101 kg)  08/06/14 181 lb (82.101 kg)       Objective:   Physical Exam  Constitutional: She appears well-developed. No distress.  Obese, NAD  HENT:  Head: Normocephalic.  Right Ear: External ear normal.  Left Ear: External ear normal.  Nose: Nose normal.  Mouth/Throat: Oropharynx is clear and moist.  Eyes: Conjunctivae are normal. Pupils are equal, round, and reactive to light. Right eye exhibits no discharge. Left eye exhibits no discharge.  Neck: Normal range of  motion. Neck supple. No JVD present. No tracheal deviation present. No thyromegaly present.  Cardiovascular: Normal rate, regular rhythm and normal heart sounds.   Pulmonary/Chest: No stridor. No respiratory distress. She has no wheezes. She has rales. She exhibits tenderness.  B breasts with fibrocystic changes  Abdominal: Soft. Bowel sounds are normal. She exhibits no distension and no mass. There is tenderness. There is no rebound and no guarding.  LLQ is tender  Musculoskeletal: She exhibits tenderness. She exhibits no edema.  Lymphadenopathy:    She has no cervical adenopathy.  Neurological: She displays normal reflexes. No cranial nerve deficit. She exhibits normal muscle tone. Coordination abnormal.  Skin: No rash noted. No erythema.  Psychiatric: Judgment and thought content normal.  Tremor B Ataxic; weak legs In a w/c B LE 4/5 MS, UE 5-/5 R ankle and R dorsal foot are less tender Intertrigo - groin B occip nerve areas are tender   Lab Results  Component Value Date   WBC 7.2 01/02/2014   HGB 11.7* 01/02/2014   HCT 34.9* 01/02/2014   PLT 268.0 01/02/2014   GLUCOSE 98 01/02/2014   CHOL 339* 08/12/2009   TRIG 298.0* 08/12/2009   HDL 43.10 08/12/2009   LDLDIRECT 232.7 08/12/2009   LDLCALC * 05/12/2009    240        Total Cholesterol/HDL:CHD Risk Coronary Heart Disease Risk Table                     Men   Women  1/2 Average Risk   3.4   3.3  Average Risk       5.0   4.4  2  X Average Risk   9.6   7.1  3 X Average Risk  23.4   11.0        Use the calculated Patient Ratio above and the CHD Risk Table to determine the patient's CHD Risk.        ATP III CLASSIFICATION (LDL):  <100     mg/dL   Optimal  100-129  mg/dL   Near or Above                    Optimal  130-159  mg/dL   Borderline  160-189  mg/dL   High  >190     mg/dL   Very High   ALT 11 01/02/2014   AST 14 01/02/2014   NA 136 01/02/2014   K 4.5 01/02/2014   CL 100 01/02/2014   CREATININE 1.0 01/02/2014    BUN 11 01/02/2014   CO2 30 01/02/2014   TSH 0.22* 01/02/2014   INR 1.0 09/16/2008   HGBA1C  09/17/2008    5.0 (NOTE)   The ADA recommends the following therapeutic goal for glycemic   control related to Hgb A1C measurement:   Goal of Therapy:   < 7.0% Hgb A1C   Reference: American Diabetes Association: Clinical Practice   Recommendations 2008, Diabetes Care,  2008, 31:(Suppl 1).     Kaylee Hansen was evaluated for a new electric (power) w/c (she has been using one for >5 years; power w/c dependent). She needs a power w/c due to her neurologic and MSK condition.   Assessment & Plan:

## 2014-12-10 NOTE — Assessment & Plan Note (Signed)
Kaylee Hansen is here to be evaluated for a new electric w/c (she has been using one now for >5 years)

## 2014-12-10 NOTE — Progress Notes (Signed)
Pre visit review using our clinic review tool, if applicable. No additional management support is needed unless otherwise documented below in the visit note. 

## 2014-12-10 NOTE — Assessment & Plan Note (Signed)
Chronic Norco, Fentanyl Rx  Potential benefits of a long term opioids use as well as potential risks (i.e. addiction risk, apnea etc) and complications (i.e. Somnolence, constipation and others) were explained to the patient and were aknowledged.

## 2015-01-04 ENCOUNTER — Ambulatory Visit: Payer: Medicare Other | Admitting: Internal Medicine

## 2015-02-25 ENCOUNTER — Ambulatory Visit (INDEPENDENT_AMBULATORY_CARE_PROVIDER_SITE_OTHER)
Admission: RE | Admit: 2015-02-25 | Discharge: 2015-02-25 | Disposition: A | Discharge status: Home / Self Care | Payer: Medicare Other | Source: Ambulatory Visit | Attending: Cardiology | Admitting: Cardiology

## 2015-02-25 ENCOUNTER — Ambulatory Visit (INDEPENDENT_AMBULATORY_CARE_PROVIDER_SITE_OTHER): Payer: Medicare Other | Admitting: Internal Medicine

## 2015-02-25 ENCOUNTER — Emergency Department (HOSPITAL_COMMUNITY)
Admission: EM | Admit: 2015-02-25 | Discharge: 2015-02-25 | Disposition: A | Discharge status: Home / Self Care | Payer: Medicare Other | Attending: Emergency Medicine | Admitting: Emergency Medicine

## 2015-02-25 ENCOUNTER — Encounter: Payer: Self-pay | Admitting: Internal Medicine

## 2015-02-25 ENCOUNTER — Ambulatory Visit (INDEPENDENT_AMBULATORY_CARE_PROVIDER_SITE_OTHER)
Admission: RE | Admit: 2015-02-25 | Discharge: 2015-02-25 | Disposition: A | Discharge status: Home / Self Care | Payer: Medicare Other | Source: Ambulatory Visit | Attending: Internal Medicine | Admitting: Internal Medicine

## 2015-02-25 ENCOUNTER — Emergency Department (HOSPITAL_COMMUNITY): Payer: Medicare Other

## 2015-02-25 ENCOUNTER — Encounter (HOSPITAL_COMMUNITY): Payer: Self-pay | Admitting: *Deleted

## 2015-02-25 VITALS — BP 110/48 | HR 94 | Wt 171.0 lb

## 2015-02-25 DIAGNOSIS — S1093XA Contusion of unspecified part of neck, initial encounter: Secondary | ICD-10-CM

## 2015-02-25 DIAGNOSIS — Y9389 Activity, other specified: Secondary | ICD-10-CM | POA: Insufficient documentation

## 2015-02-25 DIAGNOSIS — G40909 Epilepsy, unspecified, not intractable, without status epilepticus: Secondary | ICD-10-CM | POA: Diagnosis not present

## 2015-02-25 DIAGNOSIS — M179 Osteoarthritis of knee, unspecified: Secondary | ICD-10-CM | POA: Diagnosis not present

## 2015-02-25 DIAGNOSIS — E785 Hyperlipidemia, unspecified: Secondary | ICD-10-CM | POA: Diagnosis not present

## 2015-02-25 DIAGNOSIS — Y998 Other external cause status: Secondary | ICD-10-CM | POA: Insufficient documentation

## 2015-02-25 DIAGNOSIS — Z8673 Personal history of transient ischemic attack (TIA), and cerebral infarction without residual deficits: Secondary | ICD-10-CM | POA: Diagnosis not present

## 2015-02-25 DIAGNOSIS — Z79899 Other long term (current) drug therapy: Secondary | ICD-10-CM | POA: Insufficient documentation

## 2015-02-25 DIAGNOSIS — W108XXA Fall (on) (from) other stairs and steps, initial encounter: Secondary | ICD-10-CM

## 2015-02-25 DIAGNOSIS — Z8619 Personal history of other infectious and parasitic diseases: Secondary | ICD-10-CM | POA: Insufficient documentation

## 2015-02-25 DIAGNOSIS — Z85118 Personal history of other malignant neoplasm of bronchus and lung: Secondary | ICD-10-CM | POA: Insufficient documentation

## 2015-02-25 DIAGNOSIS — K219 Gastro-esophageal reflux disease without esophagitis: Secondary | ICD-10-CM | POA: Diagnosis not present

## 2015-02-25 DIAGNOSIS — S060X1A Concussion with loss of consciousness of 30 minutes or less, initial encounter: Secondary | ICD-10-CM

## 2015-02-25 DIAGNOSIS — F329 Major depressive disorder, single episode, unspecified: Secondary | ICD-10-CM | POA: Diagnosis not present

## 2015-02-25 DIAGNOSIS — F419 Anxiety disorder, unspecified: Secondary | ICD-10-CM | POA: Insufficient documentation

## 2015-02-25 DIAGNOSIS — Z86010 Personal history of colonic polyps: Secondary | ICD-10-CM | POA: Diagnosis not present

## 2015-02-25 DIAGNOSIS — S129XXA Fracture of neck, unspecified, initial encounter: Secondary | ICD-10-CM | POA: Diagnosis not present

## 2015-02-25 DIAGNOSIS — Z720 Tobacco use: Secondary | ICD-10-CM | POA: Insufficient documentation

## 2015-02-25 DIAGNOSIS — S62642A Nondisplaced fracture of proximal phalanx of right middle finger, initial encounter for closed fracture: Secondary | ICD-10-CM | POA: Diagnosis not present

## 2015-02-25 DIAGNOSIS — S22008A Other fracture of unspecified thoracic vertebra, initial encounter for closed fracture: Secondary | ICD-10-CM | POA: Insufficient documentation

## 2015-02-25 DIAGNOSIS — S60221A Contusion of right hand, initial encounter: Secondary | ICD-10-CM

## 2015-02-25 DIAGNOSIS — J449 Chronic obstructive pulmonary disease, unspecified: Secondary | ICD-10-CM | POA: Insufficient documentation

## 2015-02-25 DIAGNOSIS — E039 Hypothyroidism, unspecified: Secondary | ICD-10-CM | POA: Insufficient documentation

## 2015-02-25 DIAGNOSIS — S199XXA Unspecified injury of neck, initial encounter: Secondary | ICD-10-CM | POA: Diagnosis present

## 2015-02-25 DIAGNOSIS — Z9889 Other specified postprocedural states: Secondary | ICD-10-CM | POA: Insufficient documentation

## 2015-02-25 DIAGNOSIS — Y9289 Other specified places as the place of occurrence of the external cause: Secondary | ICD-10-CM | POA: Insufficient documentation

## 2015-02-25 DIAGNOSIS — S060X9A Concussion with loss of consciousness of unspecified duration, initial encounter: Secondary | ICD-10-CM | POA: Insufficient documentation

## 2015-02-25 DIAGNOSIS — Z880 Allergy status to penicillin: Secondary | ICD-10-CM | POA: Insufficient documentation

## 2015-02-25 DIAGNOSIS — S62609A Fracture of unspecified phalanx of unspecified finger, initial encounter for closed fracture: Secondary | ICD-10-CM

## 2015-02-25 DIAGNOSIS — S22009A Unspecified fracture of unspecified thoracic vertebra, initial encounter for closed fracture: Secondary | ICD-10-CM

## 2015-02-25 HISTORY — DX: 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postepileptic): G83.84

## 2015-02-25 MED ORDER — PHENYTOIN SODIUM EXTENDED 100 MG PO CAPS: ORAL_CAPSULE | ORAL | Status: DC

## 2015-02-25 MED ORDER — ALPRAZOLAM 0.25 MG PO TABS: 0.2500 mg | ORAL_TABLET | Freq: Three times a day (TID) | ORAL | Status: DC | PRN

## 2015-02-25 MED ORDER — HYDROCODONE-ACETAMINOPHEN 5-325 MG PO TABS
2.0000 | ORAL_TABLET | Freq: Once | ORAL | Status: AC
  Administered 2015-02-25: 2 via ORAL
  Filled 2015-02-25: qty 2

## 2015-02-25 NOTE — Assessment & Plan Note (Signed)
8/31 s/p fall 5 min LOC

## 2015-02-25 NOTE — Progress Notes (Signed)
Subjective:  Patient ID: Kaylee Hansen, female    DOB: Apr 10, 1956  Age: 59 y.o. MRN: [id number]  CC: No chief complaint on file.   HPI Oral Kaylee Hansen presents for a 3 mo f/u on chronic pain, anxiety. Pt fell down steps backwards yesterday - LOC  Outpatient Prescriptions Prior to Visit  Medication Sig Dispense Refill  . ALPRAZolam (XANAX) 0.25 MG tablet Take 1 tablet (0.25 mg total) by mouth 3 (three) times daily as needed for sleep or anxiety. Take one tablet by mouth 2-3 times daily as needed for anxiety   (3 months supply) 270 tablet 1  . calcitRIOL (ROCALTROL) 0.25 MCG capsule Take 1 capsule (0.25 mcg total) by mouth daily. 90 capsule 3  . cycloSPORINE (RESTASIS) 0.05 % ophthalmic emulsion Place 1 drop into both eyes 2 (two) times daily. 0.4 mL 0  . fentaNYL (DURAGESIC - DOSED MCG/HR) 12 MCG/HR Place 1 patch (12.5 mcg total) onto the skin every 3 (three) days. Please fill on or after 02/18/15 10 patch 0  . gabapentin (NEURONTIN) 100 MG capsule TAKE 1 CAPSULE (100 MG TOTAL) BY MOUTH 3 (THREE) TIMES DAILY. 3 MONTHS SUPPLY 270 capsule 3  . HYDROcodone-acetaminophen (NORCO) 10-325 MG per tablet Take 1 tablet by mouth every 6 (six) hours as needed for moderate pain or severe pain. Please fill on or after 02/18/15 120 tablet 0  . hydrOXYzine (ATARAX/VISTARIL) 50 MG tablet Take 50 mg by mouth.    [name] ketoconazole (NIZORAL) 2 % cream APPLY TOPICALLY DAILY. 45 g 1  . levothyroxine (SYNTHROID, LEVOTHROID) 150 MCG tablet Take 1 tablet (150 mcg total) by mouth daily. 90 tablet 3  . Naloxegol Oxalate (MOVANTIK) 25 MG TABS Take 1 tablet by mouth daily. 30 tablet 11  . NON FORMULARY Oxygen- use at night as directed     . pantoprazole (PROTONIX) 40 MG tablet Take 1 tablet (40 mg total) by mouth daily. 90 tablet 3  . PARoxetine (PAXIL) 40 MG tablet TAKE 1 TABLET (40 MG TOTAL) BY MOUTH DAILY. 90 tablet 3  . phenytoin (DILANTIN) 100 MG ER capsule 100 mg every morning and 200 mg every night. 270 capsule 3    . polyethylene glycol (MIRALAX / GLYCOLAX) packet Take 17 g by mouth daily as needed for mild constipation.    . promethazine (PHENERGAN) 25 MG tablet Take 25 mg by mouth daily as needed.    . promethazine (PHENERGAN) 25 MG tablet Take 1 tablet (25 mg total) by mouth daily. 90 tablet 1  . SUMAtriptan (IMITREX) 100 MG tablet Take 1 tablet (100 mg total) by mouth every 2 (two) hours as needed for migraine or headache. May repeat in 2 hours if headache persists or recurs. 10 tablet 3  . traZODone (DESYREL) 100 MG tablet Take 1 tablet (100 mg total) by mouth at bedtime. 90 tablet 3  . albuterol (PROVENTIL HFA;VENTOLIN HFA) 108 (90 BASE) MCG/ACT inhaler Inhale 2 puffs into the lungs every 6 (six) hours as needed for wheezing. 1 Inhaler 5   No facility-administered medications prior to visit.    ROS Review of Systems  Constitutional: Positive for fatigue. Negative for chills, activity change, appetite change and unexpected weight change.  HENT: Negative for congestion, mouth sores and sinus pressure.   Eyes: Negative for visual disturbance.  Respiratory: Negative for cough and chest tightness.   Gastrointestinal: Negative for nausea and abdominal pain.  Genitourinary: Negative for frequency, difficulty urinating and vaginal pain.  Musculoskeletal: Positive for neck pain and neck  stiffness. Negative for back pain and gait problem.  Skin: Negative for pallor and rash.  Neurological: Positive for dizziness, light-headedness and headaches. Negative for tremors, syncope, weakness and numbness.  Psychiatric/Behavioral: Negative for confusion and sleep disturbance. The patient is not nervous/anxious.     Objective:  BP 110/48 mmHg  Pulse 94  Wt 171 lb (77.565 kg)  SpO2 93%  BP Readings from Last 3 Encounters:  02/25/15 110/48  12/10/14 120/54  10/05/14 140/54    Wt Readings from Last 3 Encounters:  02/25/15 171 lb (77.565 kg)  12/10/14 174 lb (78.926 kg)  10/05/14 181 lb (82.101 kg)     Physical Exam  Constitutional: She appears well-developed. No distress.  HENT:  Head: Normocephalic.  Right Ear: External ear normal.  Left Ear: External ear normal.  Nose: Nose normal.  Mouth/Throat: Oropharynx is clear and moist.  Eyes: Conjunctivae are normal. Pupils are equal, round, and reactive to light. Right eye exhibits no discharge. Left eye exhibits no discharge.  Neck: Normal range of motion. Neck supple. No JVD present. No tracheal deviation present. No thyromegaly present.  Cardiovascular: Normal rate, regular rhythm and normal heart sounds.   Pulmonary/Chest: No stridor. No respiratory distress. She has no wheezes.  Abdominal: Soft. Bowel sounds are normal. She exhibits no distension and no mass. There is no tenderness. There is no rebound and no guarding.  Musculoskeletal: She exhibits no edema or tenderness.  Lymphadenopathy:    She has no cervical adenopathy.  Neurological: She displays normal reflexes. No cranial nerve deficit. She exhibits normal muscle tone. Coordination normal.  Skin: No rash noted. No erythema.  Psychiatric: She has a normal mood and affect. Her behavior is normal. Judgment and thought content normal.   In a w/c R fingers bruised Neck tender  Lab Results  Component Value Date   WBC 7.2 01/02/2014   HGB 11.7* 01/02/2014   HCT 34.9* 01/02/2014   PLT 268.0 01/02/2014   GLUCOSE 98 01/02/2014   CHOL 339* 08/12/2009   TRIG 298.0* 08/12/2009   HDL 43.10 08/12/2009   LDLDIRECT 232.7 08/12/2009   LDLCALC * 05/12/2009    240        Total Cholesterol/HDL:CHD Risk Coronary Heart Disease Risk Table                     Men   Women  1/2 Average Risk   3.4   3.3  Average Risk       5.0   4.4  2 X Average Risk   9.6   7.1  3 X Average Risk  23.4   11.0        Use the calculated Patient Ratio above and the CHD Risk Table to determine the patient's CHD Risk.        ATP III CLASSIFICATION (LDL):  <100     mg/dL   Optimal  100-129  mg/dL    Near or Above                    Optimal  130-159  mg/dL   Borderline  160-189  mg/dL   High  >190     mg/dL   Very High   ALT 11 01/02/2014   AST 14 01/02/2014   NA 136 01/02/2014   K 4.5 01/02/2014   CL 100 01/02/2014   CREATININE 1.0 01/02/2014   BUN 11 01/02/2014   CO2 30 01/02/2014   TSH 0.22* 01/02/2014   INR 1.0 09/16/2008  HGBA1C  09/17/2008    5.0 (NOTE)   The ADA recommends the following therapeutic goal for glycemic   control related to Hgb A1C measurement:   Goal of Therapy:   < 7.0% Hgb A1C   Reference: American Diabetes Association: Clinical Practice   Recommendations 2008, Diabetes Care,  2008, 31:(Suppl 1).    Dg Chest 2 View  07/10/2014   CLINICAL DATA:  Right-sided chest wall pain after fall 3 days ago.  EXAM: CHEST  2 VIEW  COMPARISON:  Oct 30, 2013.  FINDINGS: The heart size and mediastinal contours are within normal limits. Both lungs are clear. No pneumothorax or pleural effusion is noted. Old bilateral rib fractures are noted.  IMPRESSION: Old bilateral rib fractures are noted and stable compared to prior exam. No acute cardiopulmonary abnormality seen.   Electronically Signed   By: Sabino Dick M.D.   On: 07/10/2014 09:54    Assessment & Plan:   There are no diagnoses linked to this encounter. I am having Kaylee Hansen maintain her NON FORMULARY, albuterol, cycloSPORINE, polyethylene glycol, promethazine, gabapentin, phenytoin, SUMAtriptan, naloxegol oxalate, calcitRIOL, levothyroxine, pantoprazole, ALPRAZolam, hydrOXYzine, PARoxetine, traZODone, promethazine, ketoconazole, HYDROcodone-acetaminophen, and fentaNYL.  No orders of the defined types were placed in this encounter.     Follow-up: No Follow-up on file.  Walker Kehr, MD

## 2015-02-25 NOTE — ED Notes (Signed)
Pt was sent by Dr Alain Marion for neck fracture.  Pt's husband states she lost her balance while going up stairs, fell down 5 stairs and lost consciousness for several minutes.  X-rays today showed multiple neck fractures.  Per [facility], Dr Mamie Nick called her and stated pt refused harder neck brace and refused to be boarded by EMS.

## 2015-02-25 NOTE — Assessment & Plan Note (Signed)
Concussion, neck contusion, L rib contusion, R fingers #3-4 hematomas

## 2015-02-25 NOTE — ED Notes (Signed)
[facility] brace and place and family and patient educated on application and removal in case of an emergency. Family member given [employer] card for contact.

## 2015-02-25 NOTE — ED Notes (Addendum)
[employer] called for [employer]

## 2015-02-25 NOTE — ED Provider Notes (Signed)
CSN: [id number]     Arrival date & time 02/25/15  1233 History   First MD Initiated Contact with Patient 02/25/15 1247     Chief Complaint  Patient presents with  . Neck Injury   HPI Patient was sent to the emergency room after being diagnosed with a cervical spine fracture by her primary care doctor. Patient was walking up stairs yesterday when she fell down several steps hitting her head and neck. Patient had an episode of loss of consciousness yesterday. Since that time she's not had any trouble with any headache nausea or vomiting but has had persistent pain in her neck. The patient had a CT scan of her cervical spine and head as an outpatient.  The results showed a fracture of her cervical spine and thoracic spine.  The patient was sent here for definitive treatment Past Medical History  Diagnosis Date  . Hx of cancer of lung 1999  . History of cholecystectomy   . Hx of appendectomy   . Hx of hysterectomy   . COPD (chronic obstructive pulmonary disease)   . Low back pain   . Osteoarthritis of knee     bilateral knee  . Seizures   . Hypothyroidism   . Anxiety   . Depression   . Stroke     CVA, hx of 97  . Hyperlipidemia   . Collagenous colitis   . Esophageal ulcer     04/2009 EGD  . Esophageal ulcer 04/2009  . Helicobacter pylori gastritis 2010    Pylera Tx  . Constipation     Chronic abdominal pain and constipation  . Colon adenomas 2011  . Diverticulosis   . GERD (gastroesophageal reflux disease)   . Esophageal stricture 11/08/2012  . Todd's paralysis    Past Surgical History  Procedure Laterality Date  . Appendectomy    . Abdominal hysterectomy      complete 1992  . Cholecystectomy    . Total hip arthroplasty      Left  . Abdominal surgery      Exploratory  . Esophagogastroduodenoscopy      w/baloon x 2  . Colonoscopy w/ biopsies      multiple  . Esophagogastroduodenoscopy N/A 11/08/2012    Procedure: ESOPHAGOGASTRODUODENOSCOPY (EGD);  Surgeon: Gatha Mayer, MD;  Location: [facility];  Service: Endoscopy;  Laterality: N/A;  . Balloon dilation N/A 11/08/2012    Procedure: BALLOON DILATION;  Surgeon: Gatha Mayer, MD;  Location: [facility];  Service: Endoscopy;  Laterality: N/A;   Family History  Problem Relation Age of Onset  . Coronary artery disease Mother   . Diabetes Mother   . Diabetes Son   . Colon cancer Neg Hx     colon  . Coronary artery disease Other     grandmother, grandfather  . Kidney disease Other     aunt  . Hypertension Father    Social History  Substance Use Topics  . Smoking status: Current Every Day Smoker -- 0.50 packs/day    Types: Cigarettes  . Smokeless tobacco: Never Used  . Alcohol Use: No   OB History    No data available     Review of Systems  All other systems reviewed and are negative.     Allergies  Atorvastatin; Morphine; Oxycodone hcl; Penicillins; and Tape  Home Medications   Prior to Admission medications   Medication Sig Start Date End Date Taking? Authorizing Provider  ALPRAZolam (XANAX) 0.25 MG tablet Take 1 tablet (0.25  mg total) by mouth 3 (three) times daily as needed for sleep or anxiety. Take one tablet by mouth 2-3 times daily as needed for anxiety   (3 months supply) Patient taking differently: Take 0.25 mg by mouth at bedtime.  02/25/15  Yes Aleksei Plotnikov V, MD  calcitRIOL (ROCALTROL) 0.25 MCG capsule Take 1 capsule (0.25 mcg total) by mouth daily. 08/06/14  Yes Aleksei Plotnikov V, MD  DiphenhydrAMINE HCl, Sleep, (ZZZQUIL PO) Take 1 tablet by mouth at bedtime.   Yes Historical Provider, MD  fentaNYL (DURAGESIC - DOSED MCG/HR) 12 MCG/HR Place 1 patch (12.5 mcg total) onto the skin every 3 (three) days. Please fill on or after 02/18/15 12/10/14  Yes Aleksei Plotnikov V, MD  gabapentin (NEURONTIN) 100 MG capsule TAKE 1 CAPSULE (100 MG TOTAL) BY MOUTH 3 (THREE) TIMES DAILY. 3 MONTHS SUPPLY 09/15/13  Yes Aleksei Plotnikov V, MD  HYDROcodone-acetaminophen (NORCO) 10-325  MG per tablet Take 1 tablet by mouth every 6 (six) hours as needed for moderate pain or severe pain. Please fill on or after 02/18/15 12/10/14  Yes Aleksei Plotnikov V, MD  hydrOXYzine (ATARAX/VISTARIL) 50 MG tablet Take 50 mg by mouth at bedtime.    Yes Historical Provider, MD  levothyroxine (SYNTHROID, LEVOTHROID) 150 MCG tablet Take 1 tablet (150 mcg total) by mouth daily. 08/06/14  Yes Aleksei Plotnikov V, MD  pantoprazole (PROTONIX) 40 MG tablet Take 1 tablet (40 mg total) by mouth daily. 08/06/14  Yes Aleksei Plotnikov V, MD  PARoxetine (PAXIL) 40 MG tablet TAKE 1 TABLET (40 MG TOTAL) BY MOUTH DAILY. 10/05/14  Yes Aleksei Plotnikov V, MD  phenytoin (DILANTIN) 100 MG ER capsule 100 mg every morning and 200 mg every night. 02/25/15  Yes Aleksei Plotnikov V, MD  polyethylene glycol (MIRALAX / GLYCOLAX) packet Take 17 g by mouth daily as needed for mild constipation.   Yes Historical Provider, MD  traZODone (DESYREL) 100 MG tablet Take 1 tablet (100 mg total) by mouth at bedtime. 10/05/14  Yes Aleksei Plotnikov V, MD  albuterol (PROVENTIL HFA;VENTOLIN HFA) 108 (90 BASE) MCG/ACT inhaler Inhale 2 puffs into the lungs every 6 (six) hours as needed for wheezing. 11/03/11 11/02/12  Aleksei Plotnikov V, MD  cycloSPORINE (RESTASIS) 0.05 % ophthalmic emulsion Place 1 drop into both eyes 2 (two) times daily. 07/03/12   Aleksei Plotnikov V, MD  ketoconazole (NIZORAL) 2 % cream APPLY TOPICALLY DAILY. Patient not taking: Reported on 02/25/2015 12/10/14   Cassandria Anger, MD  Naloxegol Oxalate (MOVANTIK) 25 MG TABS Take 1 tablet by mouth daily. Patient not taking: Reported on 02/25/2015 05/01/14   Cassandria Anger, MD  NON FORMULARY Oxygen- use at night as directed     Historical Provider, MD  promethazine (PHENERGAN) 25 MG tablet Take 25 mg by mouth daily as needed for nausea or vomiting.  01/18/12   Rowe Clack, MD  promethazine (PHENERGAN) 25 MG tablet Take 1 tablet (25 mg total) by mouth daily. Patient not  taking: Reported on 02/25/2015 10/05/14   Cassandria Anger, MD  SUMAtriptan (IMITREX) 100 MG tablet Take 1 tablet (100 mg total) by mouth every 2 (two) hours as needed for migraine or headache. May repeat in 2 hours if headache persists or recurs. 01/29/14   Aleksei Plotnikov V, MD   BP 134/52 mmHg  Pulse 86  Temp(Src) 98.4 F (36.9 C) (Oral)  Resp 26  Ht '5\' 6"'$  (1.676 m)  Wt 170 lb (77.111 kg)  BMI 27.45 kg/m2  SpO2 98% Physical  Exam  Constitutional: She appears well-developed and well-nourished. No distress.  HENT:  Head: Normocephalic and atraumatic.  Right Ear: External ear normal.  Left Ear: External ear normal.  Eyes: Conjunctivae are normal. Right eye exhibits no discharge. Left eye exhibits no discharge. No scleral icterus.  Neck: Neck supple. Spinous process tenderness present. Decreased range of motion present. No tracheal deviation present.  Cardiovascular: Normal rate, regular rhythm and intact distal pulses.   Pulmonary/Chest: Effort normal and breath sounds normal. No stridor. No respiratory distress. She has no wheezes. She has no rales.  Abdominal: Soft. Bowel sounds are normal. She exhibits no distension. There is no tenderness. There is no rebound and no guarding.  Musculoskeletal: She exhibits no edema.       Cervical back: She exhibits tenderness and bony tenderness.       Right hand: She exhibits tenderness. Normal sensation noted.  Patient is tenderness to palpation in the very distal cervical spine and proximal thoracic spine region; bruising of middle and ring finger  Neurological: She is alert. She has normal strength. No cranial nerve deficit (no facial droop, extraocular movements intact, no slurred speech) or sensory deficit. She exhibits normal muscle tone. She displays no seizure activity. Coordination normal.  Patient has equal grip strength bilaterally, normal sensation in her hands, normal sensation in her feet, normal plantar flexion bilaterally  Skin: Skin  is warm and dry. No rash noted.  Psychiatric: She has a normal mood and affect.  Nursing note and vitals reviewed.   ED Course  Procedures (including critical care time) Ring removal The patient presented to the emergency room with a ring on her right ring finger. She had not been able to remove it. Prior to x-rays using a ring cutter device (leatherman trauma seizures) I was able to remove the patient's ring.  Patient or husband verbally agree to this procedure.  Imaging Review Ct Head Wo Contrast  02/25/2015   CLINICAL DATA:  Fall down steps yesterday with head and neck pain. Loss of conscious 30 min. Frequent falls.  EXAM: CT HEAD WITHOUT CONTRAST  CT CERVICAL SPINE WITHOUT CONTRAST  TECHNIQUE: Multidetector CT imaging of the head and cervical spine was performed following the standard protocol without intravenous contrast. Multiplanar CT image reconstructions of the cervical spine were also generated.  COMPARISON:  Head CT 12/2013  FINDINGS: CT HEAD FINDINGS  There is mild motion artifact present over the inferior images. Ventricles, cisterns and other CSF spaces are within normal. There is subtle chronic ischemic microvascular disease. There is no mass, mass effect, shift of midline structures or acute hemorrhage. Bones and soft tissues are within normal.  CT CERVICAL SPINE FINDINGS  Vertebral body alignment, heights and disc space heights within the cervical spine are normal. There is minimal spondylosis. There is a prominent left posterior osteophyte at the C5-6 level. Facet arthropathy is present. Prevertebral soft tissues are normal.  There is a nondisplaced fracture through the anterior arch of C1 just right of midline. There is also a very subtle nondisplaced fracture along the lateral aspect of the right lateral mass of C2. There is an acute moderate compression/burst fracture of T2. There is a small fragment of this T2 fracture in the midline anterior canal without significant mass effect on  the cord or canal. Subtle fracture of the tip of the T2 spinous process.  Mild emphysematous change over the upper lungs.  IMPRESSION: No acute intracranial findings.  Subtle chronic ischemic microvascular disease.  Nondisplaced fracture of the  anterior arch of C1 just right of midline as well as nondisplaced fracture along the lateral aspect of the lateral mass of C2.  Acute moderate compression/burst fracture of T2. Small fragment over the midline anterior aspect of the spinal canal without significant canal stenosis or cord compression. Subtle fracture of the tip of the T2 spinous process.  Mild spondylosis of the cervical spine.  Critical Value/emergent results were called by telephone at the time of interpretation on 02/25/2015 at 12:05 pm to Dr. Lew Dawes , who verbally acknowledged these results.   Electronically Signed   By: Marin Olp M.D.   On: 02/25/2015 12:06   Ct Cervical Spine Wo Contrast  02/25/2015   CLINICAL DATA:  Fall down steps yesterday with head and neck pain. Loss of conscious 30 min. Frequent falls.  EXAM: CT HEAD WITHOUT CONTRAST  CT CERVICAL SPINE WITHOUT CONTRAST  TECHNIQUE: Multidetector CT imaging of the head and cervical spine was performed following the standard protocol without intravenous contrast. Multiplanar CT image reconstructions of the cervical spine were also generated.  COMPARISON:  Head CT 12/2013  FINDINGS: CT HEAD FINDINGS  There is mild motion artifact present over the inferior images. Ventricles, cisterns and other CSF spaces are within normal. There is subtle chronic ischemic microvascular disease. There is no mass, mass effect, shift of midline structures or acute hemorrhage. Bones and soft tissues are within normal.  CT CERVICAL SPINE FINDINGS  Vertebral body alignment, heights and disc space heights within the cervical spine are normal. There is minimal spondylosis. There is a prominent left posterior osteophyte at the C5-6 level. Facet arthropathy is  present. Prevertebral soft tissues are normal.  There is a nondisplaced fracture through the anterior arch of C1 just right of midline. There is also a very subtle nondisplaced fracture along the lateral aspect of the right lateral mass of C2. There is an acute moderate compression/burst fracture of T2. There is a small fragment of this T2 fracture in the midline anterior canal without significant mass effect on the cord or canal. Subtle fracture of the tip of the T2 spinous process.  Mild emphysematous change over the upper lungs.  IMPRESSION: No acute intracranial findings.  Subtle chronic ischemic microvascular disease.  Nondisplaced fracture of the anterior arch of C1 just right of midline as well as nondisplaced fracture along the lateral aspect of the lateral mass of C2.  Acute moderate compression/burst fracture of T2. Small fragment over the midline anterior aspect of the spinal canal without significant canal stenosis or cord compression. Subtle fracture of the tip of the T2 spinous process.  Mild spondylosis of the cervical spine.  Critical Value/emergent results were called by telephone at the time of interpretation on 02/25/2015 at 12:05 pm to Dr. Lew Dawes , who verbally acknowledged these results.   Electronically Signed   By: Marin Olp M.D.   On: 02/25/2015 12:06   Dg Hand Complete Right  02/25/2015   CLINICAL DATA:  Patient lost her balance and fell on the stairs at home yesterday, fell down 5 stairs and lost consciousness. Patient sustained multiple cervical spine fractures. Pain, bruising and swelling of the long and ring fingers of the right hand. Initial encounter.  EXAM: RIGHT HAND - COMPLETE 3+ VIEW  COMPARISON:  None.  FINDINGS: Nondisplaced fracture involving the base of middle phalanx of the long finger. A similar nondisplaced fracture involving the base of the middle phalanx of the ring finger is suspected. No fractures elsewhere. Mild joint space narrowing  involving the IP  joints of essentially all of the fingers, worst involving the PIP joints of the ring and small fingers. Similar joint space narrowing involving the 5th MCP joint.  IMPRESSION: 1. Nondisplaced fracture involving the base the middle phalanx the long finger. 2. Nondisplaced fracture involving the base of the middle phalanx of the ring finger is suspected. 3. Mild osteoarthritis.   Electronically Signed   By: Evangeline Dakin M.D.   On: 02/25/2015 13:59   I have personally reviewed and evaluated these images and lab results as part of my medical decision-making.   MDM   Final diagnoses:  Cervical spine fracture, initial encounter  Thoracic spine fracture, closed, initial encounter  Finger fracture, closed, initial encounter    Patient has outpatient CT scan that shows a fracture of C1, C2 and T1. She has no acute neurologic abnormalities on exam. I will consult with neurosurgery. Plan on placing her in a cervical spine Aspen collar right now  Discussed findings with Dr Vertell Limber.  Her recommended Aspen collar with an additional brace (yale).  [employer] contacted.  Finger injuries also splinted.  Outpatient follow up with neurosurg and hand surgery    Dorie Rank, MD 02/25/15 1557

## 2015-02-25 NOTE — Discharge Instructions (Signed)
Vertebral Fracture  You have a fracture of one or more vertebra. These are the bony parts that form the spine. Minor vertebral fractures happen when people fall. Osteoporosis is associated with many of these fractures. Hospital care may not be necessary for minor compression fractures that are stable. However, multiple fractures of the spine or unstable injuries can cause severe pain and even damage the spinal cord. A spinal cord injury may cause paralysis, numbness, or loss of normal bowel and bladder control.   Normally there is pain and stiffness in the back for 3 to 6 weeks after a vertebral fracture. Bed rest for several days, pain medicine, and a slow return to activity is often the only treatment that is needed depending on the location of the fracture. Neck and back braces may be helpful in reducing pain and increasing mobility. When your pain allows, you should begin walking or swimming to help maintain your endurance. Exercises to improve motion and to strengthen the back may also be useful after the initial pain improves. Treatment for osteoporosis may be essential for full recovery. This will help reduce your risk of vertebral fractures with a future fall.  During the first few days after a spine fracture you may feel nauseated or vomit. If this is severe, hospital care with IV fluids will be needed.   Arrange for follow-up care as recommended to assure proper long-term care and prevention of further spine injury.   SEEK IMMEDIATE MEDICAL CARE IF:   You have increasing pain, vomiting, or are unable to move around at all.   You develop numbness, tingling, weakness, or paralysis of any part of your body.   You develop a loss of normal bowel or bladder control.   You have difficulty breathing, cough, fever, chest or abdominal pain.  MAKE SURE YOU:    Understand these instructions.   Will watch your condition.   Will get help right away if you are not doing well or get worse.  Document Released:  07/20/2004 Document Revised: 09/04/2011 Document Reviewed: 02/02/2009  Exit[employer] Patient Information 2015 [employer]. This information is not intended to replace advice given to you by your health care provider. Make sure you discuss any questions you have with your health care provider.

## 2015-02-25 NOTE — Progress Notes (Signed)
Orthopedic Tech Progress Note Patient Details:  Kaylee Hansen 01-05-1956 [id number]  Ortho Devices Type of Ortho Device: Finger splint, Buddy tape Ortho Device/Splint Location: RUE Ortho Device/Splint Interventions: Ordered, Application   Braulio Bosch 02/25/2015, 4:06 PM

## 2015-02-25 NOTE — ED Notes (Signed)
[employer]

## 2015-02-25 NOTE — Assessment & Plan Note (Signed)
8/31 s/p fall CT neck Soft collar

## 2015-02-25 NOTE — Assessment & Plan Note (Addendum)
8/31 s/p fall ACE

## 2015-02-25 NOTE — ED Notes (Signed)
[employer] T Bedside Applying yale brace.

## 2015-02-25 NOTE — Progress Notes (Signed)
Pre visit review using our clinic review tool, if applicable. No additional management support is needed unless otherwise documented below in the visit note. 

## 2015-03-05 ENCOUNTER — Encounter: Payer: Self-pay | Admitting: Internal Medicine

## 2015-03-05 ENCOUNTER — Ambulatory Visit (INDEPENDENT_AMBULATORY_CARE_PROVIDER_SITE_OTHER): Payer: Medicare Other | Admitting: Internal Medicine

## 2015-03-05 VITALS — BP 110/42 | HR 96 | Wt 171.0 lb

## 2015-03-05 DIAGNOSIS — S129XXD Fracture of neck, unspecified, subsequent encounter: Secondary | ICD-10-CM

## 2015-03-05 DIAGNOSIS — S060X1D Concussion with loss of consciousness of 30 minutes or less, subsequent encounter: Secondary | ICD-10-CM | POA: Diagnosis not present

## 2015-03-05 DIAGNOSIS — S62609D Fracture of unspecified phalanx of unspecified finger, subsequent encounter for fracture with routine healing: Secondary | ICD-10-CM | POA: Diagnosis not present

## 2015-03-05 DIAGNOSIS — S62609A Fracture of unspecified phalanx of unspecified finger, initial encounter for closed fracture: Secondary | ICD-10-CM | POA: Insufficient documentation

## 2015-03-05 DIAGNOSIS — Z23 Encounter for immunization: Secondary | ICD-10-CM

## 2015-03-05 DIAGNOSIS — S129XXA Fracture of neck, unspecified, initial encounter: Secondary | ICD-10-CM | POA: Insufficient documentation

## 2015-03-05 MED ORDER — FENTANYL 12 MCG/HR TD PT72: 12.5000 ug | MEDICATED_PATCH | TRANSDERMAL | Status: DC

## 2015-03-05 MED ORDER — HYDROCODONE-ACETAMINOPHEN 10-325 MG PO TABS: 1.0000 | ORAL_TABLET | Freq: Four times a day (QID) | ORAL | Status: DC | PRN

## 2015-03-05 MED ORDER — PHENYTOIN SODIUM EXTENDED 100 MG PO CAPS: ORAL_CAPSULE | ORAL | Status: DC

## 2015-03-05 MED ORDER — GABAPENTIN 100 MG PO CAPS: ORAL_CAPSULE | ORAL | Status: DC

## 2015-03-05 NOTE — Assessment & Plan Note (Addendum)
9/16 Finger R #3-4 injuries was splinted. Outpatient follow up with neurosurg and hand surgery - Dr Apolonio Schneiders was scheduled. A new finger splint/Coban wrap was provided

## 2015-03-05 NOTE — Progress Notes (Signed)
Subjective:  Patient ID: Kaylee Hansen, female    DOB: 06/02/1956  Age: 59 y.o. MRN: [id number]  CC: No chief complaint on file.   HPI Vora L Michele presents for cervical spine fx, finger fx, concussion f/u  Outpatient CT scan that shows a fracture of C1, C2 and T1. She had no acute neurologic abnormalities on exam. Dr Tomi Bamberger discussed findings with Dr Vertell Limber who recommended Aspen collar with an additional brace (yale).  Finger injuries was splinted. Outpatient follow up with neurosurg and hand surgery - Dr Apolonio Schneiders was scheduled.  Outpatient Prescriptions Prior to Visit  Medication Sig Dispense Refill  . ALPRAZolam (XANAX) 0.25 MG tablet Take 1 tablet (0.25 mg total) by mouth 3 (three) times daily as needed for sleep or anxiety. Take one tablet by mouth 2-3 times daily as needed for anxiety   (3 months supply) (Patient taking differently: Take 0.25 mg by mouth at bedtime. ) 270 tablet 1  . calcitRIOL (ROCALTROL) 0.25 MCG capsule Take 1 capsule (0.25 mcg total) by mouth daily. 90 capsule 3  . cycloSPORINE (RESTASIS) 0.05 % ophthalmic emulsion Place 1 drop into both eyes 2 (two) times daily. 0.4 mL 0  . DiphenhydrAMINE HCl, Sleep, (ZZZQUIL PO) Take 1 tablet by mouth at bedtime.    [name] ketoconazole (NIZORAL) 2 % cream APPLY TOPICALLY DAILY. 45 g 1  . levothyroxine (SYNTHROID, LEVOTHROID) 150 MCG tablet Take 1 tablet (150 mcg total) by mouth daily. 90 tablet 3  . Naloxegol Oxalate (MOVANTIK) 25 MG TABS Take 1 tablet by mouth daily. 30 tablet 11  . NON FORMULARY Oxygen- use at night as directed     . pantoprazole (PROTONIX) 40 MG tablet Take 1 tablet (40 mg total) by mouth daily. 90 tablet 3  . PARoxetine (PAXIL) 40 MG tablet TAKE 1 TABLET (40 MG TOTAL) BY MOUTH DAILY. 90 tablet 3  . polyethylene glycol (MIRALAX / GLYCOLAX) packet Take 17 g by mouth daily as needed for mild constipation.    . promethazine (PHENERGAN) 25 MG tablet Take 25 mg by mouth daily as needed for nausea or vomiting.      . promethazine (PHENERGAN) 25 MG tablet Take 1 tablet (25 mg total) by mouth daily. 90 tablet 1  . SUMAtriptan (IMITREX) 100 MG tablet Take 1 tablet (100 mg total) by mouth every 2 (two) hours as needed for migraine or headache. May repeat in 2 hours if headache persists or recurs. 10 tablet 3  . traZODone (DESYREL) 100 MG tablet Take 1 tablet (100 mg total) by mouth at bedtime. 90 tablet 3  . fentaNYL (DURAGESIC - DOSED MCG/HR) 12 MCG/HR Place 1 patch (12.5 mcg total) onto the skin every 3 (three) days. Please fill on or after 02/18/15 10 patch 0  . gabapentin (NEURONTIN) 100 MG capsule TAKE 1 CAPSULE (100 MG TOTAL) BY MOUTH 3 (THREE) TIMES DAILY. 3 MONTHS SUPPLY 270 capsule 3  . HYDROcodone-acetaminophen (NORCO) 10-325 MG per tablet Take 1 tablet by mouth every 6 (six) hours as needed for moderate pain or severe pain. Please fill on or after 02/18/15 120 tablet 0  . hydrOXYzine (ATARAX/VISTARIL) 50 MG tablet Take 50 mg by mouth at bedtime.     . phenytoin (DILANTIN) 100 MG ER capsule 100 mg every morning and 200 mg every night. 270 capsule 3  . albuterol (PROVENTIL HFA;VENTOLIN HFA) 108 (90 BASE) MCG/ACT inhaler Inhale 2 puffs into the lungs every 6 (six) hours as needed for wheezing. 1 Inhaler 5   No  facility-administered medications prior to visit.    ROS Review of Systems  Constitutional: Positive for fatigue. Negative for chills, activity change, appetite change and unexpected weight change.  HENT: Negative for congestion, mouth sores and sinus pressure.   Eyes: Negative for visual disturbance.  Respiratory: Negative for cough and chest tightness.   Gastrointestinal: Negative for nausea and abdominal pain.  Genitourinary: Negative for urgency, frequency, difficulty urinating and vaginal pain.  Musculoskeletal: Positive for back pain, arthralgias and neck pain. Negative for gait problem.  Skin: Negative for pallor and rash.  Neurological: Positive for dizziness, weakness and headaches.  Negative for tremors, syncope and numbness.  Psychiatric/Behavioral: Positive for decreased concentration. Negative for behavioral problems, confusion and sleep disturbance. The patient is nervous/anxious.     Objective:  BP 110/42 mmHg  Pulse 96  Wt 171 lb (77.565 kg)  SpO2 92%  BP Readings from Last 3 Encounters:  03/05/15 110/42  02/25/15 139/43  02/25/15 110/48    Wt Readings from Last 3 Encounters:  03/05/15 171 lb (77.565 kg)  02/25/15 170 lb (77.111 kg)  02/25/15 171 lb (77.565 kg)    Physical Exam  Constitutional: She appears well-developed. No distress.  HENT:  Head: Normocephalic.  Right Ear: External ear normal.  Left Ear: External ear normal.  Nose: Nose normal.  Mouth/Throat: Oropharynx is clear and moist.  Eyes: Conjunctivae are normal. Pupils are equal, round, and reactive to light. Right eye exhibits no discharge. Left eye exhibits no discharge.  Neck: Normal range of motion. Neck supple. No JVD present. No tracheal deviation present. No thyromegaly present.  Cardiovascular: Normal rate, regular rhythm and normal heart sounds.   Pulmonary/Chest: No stridor. No respiratory distress. She has no wheezes.  Abdominal: Soft. Bowel sounds are normal. She exhibits no distension and no mass. There is no tenderness. There is no rebound and no guarding.  Musculoskeletal: She exhibits tenderness. She exhibits no edema.  Lymphadenopathy:    She has no cervical adenopathy.  Neurological: She displays abnormal reflex. No cranial nerve deficit. She exhibits normal muscle tone. Coordination abnormal.  Skin: No rash noted. No erythema.  In a w/c Neck is in an Microbiologist were re-splinted R  Lab Results  Component Value Date   WBC 7.2 01/02/2014   HGB 11.7* 01/02/2014   HCT 34.9* 01/02/2014   PLT 268.0 01/02/2014   GLUCOSE 98 01/02/2014   CHOL 339* 08/12/2009   TRIG 298.0* 08/12/2009   HDL 43.10 08/12/2009   LDLDIRECT 232.7 08/12/2009   LDLCALC *  05/12/2009    240        Total Cholesterol/HDL:CHD Risk Coronary Heart Disease Risk Table                     Men   Women  1/2 Average Risk   3.4   3.3  Average Risk       5.0   4.4  2 X Average Risk   9.6   7.1  3 X Average Risk  23.4   11.0        Use the calculated Patient Ratio above and the CHD Risk Table to determine the patient's CHD Risk.        ATP III CLASSIFICATION (LDL):  <100     mg/dL   Optimal  100-129  mg/dL   Near or Above                    Optimal  130-159  mg/dL   Borderline  160-189  mg/dL   High  >190     mg/dL   Very High   ALT 11 01/02/2014   AST 14 01/02/2014   NA 136 01/02/2014   K 4.5 01/02/2014   CL 100 01/02/2014   CREATININE 1.0 01/02/2014   BUN 11 01/02/2014   CO2 30 01/02/2014   TSH 0.22* 01/02/2014   INR 1.0 09/16/2008   HGBA1C  09/17/2008    5.0 (NOTE)   The ADA recommends the following therapeutic goal for glycemic   control related to Hgb A1C measurement:   Goal of Therapy:   < 7.0% Hgb A1C   Reference: American Diabetes Association: Clinical Practice   Recommendations 2008, Diabetes Care,  2008, 31:(Suppl 1).    Ct Head Wo Contrast  02/25/2015   CLINICAL DATA:  Fall down steps yesterday with head and neck pain. Loss of conscious 30 min. Frequent falls.  EXAM: CT HEAD WITHOUT CONTRAST  CT CERVICAL SPINE WITHOUT CONTRAST  TECHNIQUE: Multidetector CT imaging of the head and cervical spine was performed following the standard protocol without intravenous contrast. Multiplanar CT image reconstructions of the cervical spine were also generated.  COMPARISON:  Head CT 12/2013  FINDINGS: CT HEAD FINDINGS  There is mild motion artifact present over the inferior images. Ventricles, cisterns and other CSF spaces are within normal. There is subtle chronic ischemic microvascular disease. There is no mass, mass effect, shift of midline structures or acute hemorrhage. Bones and soft tissues are within normal.  CT CERVICAL SPINE FINDINGS  Vertebral body  alignment, heights and disc space heights within the cervical spine are normal. There is minimal spondylosis. There is a prominent left posterior osteophyte at the C5-6 level. Facet arthropathy is present. Prevertebral soft tissues are normal.  There is a nondisplaced fracture through the anterior arch of C1 just right of midline. There is also a very subtle nondisplaced fracture along the lateral aspect of the right lateral mass of C2. There is an acute moderate compression/burst fracture of T2. There is a small fragment of this T2 fracture in the midline anterior canal without significant mass effect on the cord or canal. Subtle fracture of the tip of the T2 spinous process.  Mild emphysematous change over the upper lungs.  IMPRESSION: No acute intracranial findings.  Subtle chronic ischemic microvascular disease.  Nondisplaced fracture of the anterior arch of C1 just right of midline as well as nondisplaced fracture along the lateral aspect of the lateral mass of C2.  Acute moderate compression/burst fracture of T2. Small fragment over the midline anterior aspect of the spinal canal without significant canal stenosis or cord compression. Subtle fracture of the tip of the T2 spinous process.  Mild spondylosis of the cervical spine.  Critical Value/emergent results were called by telephone at the time of interpretation on 02/25/2015 at 12:05 pm to Dr. Lew Dawes , who verbally acknowledged these results.   Electronically Signed   By: Marin Olp M.D.   On: 02/25/2015 12:06   Ct Cervical Spine Wo Contrast  02/25/2015   CLINICAL DATA:  Fall down steps yesterday with head and neck pain. Loss of conscious 30 min. Frequent falls.  EXAM: CT HEAD WITHOUT CONTRAST  CT CERVICAL SPINE WITHOUT CONTRAST  TECHNIQUE: Multidetector CT imaging of the head and cervical spine was performed following the standard protocol without intravenous contrast. Multiplanar CT image reconstructions of the cervical spine were also  generated.  COMPARISON:  Head CT 12/2013  FINDINGS: CT  HEAD FINDINGS  There is mild motion artifact present over the inferior images. Ventricles, cisterns and other CSF spaces are within normal. There is subtle chronic ischemic microvascular disease. There is no mass, mass effect, shift of midline structures or acute hemorrhage. Bones and soft tissues are within normal.  CT CERVICAL SPINE FINDINGS  Vertebral body alignment, heights and disc space heights within the cervical spine are normal. There is minimal spondylosis. There is a prominent left posterior osteophyte at the C5-6 level. Facet arthropathy is present. Prevertebral soft tissues are normal.  There is a nondisplaced fracture through the anterior arch of C1 just right of midline. There is also a very subtle nondisplaced fracture along the lateral aspect of the right lateral mass of C2. There is an acute moderate compression/burst fracture of T2. There is a small fragment of this T2 fracture in the midline anterior canal without significant mass effect on the cord or canal. Subtle fracture of the tip of the T2 spinous process.  Mild emphysematous change over the upper lungs.  IMPRESSION: No acute intracranial findings.  Subtle chronic ischemic microvascular disease.  Nondisplaced fracture of the anterior arch of C1 just right of midline as well as nondisplaced fracture along the lateral aspect of the lateral mass of C2.  Acute moderate compression/burst fracture of T2. Small fragment over the midline anterior aspect of the spinal canal without significant canal stenosis or cord compression. Subtle fracture of the tip of the T2 spinous process.  Mild spondylosis of the cervical spine.  Critical Value/emergent results were called by telephone at the time of interpretation on 02/25/2015 at 12:05 pm to Dr. Lew Dawes , who verbally acknowledged these results.   Electronically Signed   By: Marin Olp M.D.   On: 02/25/2015 12:06   Dg Hand Complete  Right  02/25/2015   CLINICAL DATA:  Patient lost her balance and fell on the stairs at home yesterday, fell down 5 stairs and lost consciousness. Patient sustained multiple cervical spine fractures. Pain, bruising and swelling of the long and ring fingers of the right hand. Initial encounter.  EXAM: RIGHT HAND - COMPLETE 3+ VIEW  COMPARISON:  None.  FINDINGS: Nondisplaced fracture involving the base of middle phalanx of the long finger. A similar nondisplaced fracture involving the base of the middle phalanx of the ring finger is suspected. No fractures elsewhere. Mild joint space narrowing involving the IP joints of essentially all of the fingers, worst involving the PIP joints of the ring and small fingers. Similar joint space narrowing involving the 5th MCP joint.  IMPRESSION: 1. Nondisplaced fracture involving the base the middle phalanx the long finger. 2. Nondisplaced fracture involving the base of the middle phalanx of the ring finger is suspected. 3. Mild osteoarthritis.   Electronically Signed   By: Evangeline Dakin M.D.   On: 02/25/2015 13:59    Assessment & Plan:   Diagnoses and all orders for this visit:  Cervical vertebral fracture, subsequent encounter  Finger fracture, right, with routine healing, subsequent encounter  Concussion with loss of consciousness, 30 minutes or less, subsequent encounter  Other orders -     gabapentin (NEURONTIN) 100 MG capsule; TAKE 1 CAPSULE (100 MG TOTAL) BY MOUTH 3 (THREE) TIMES DAILY. 3 MONTHS SUPPLY -     Cancel: hydrOXYzine (ATARAX/VISTARIL) 50 MG tablet; Take 1 tablet (50 mg total) by mouth at bedtime. -     phenytoin (DILANTIN) 100 MG ER capsule; 100 mg every morning and 200 mg every night. -  Discontinue: HYDROcodone-acetaminophen (NORCO) 10-325 MG per tablet; Take 1 tablet by mouth every 6 (six) hours as needed for moderate pain or severe pain. Please fill on or after 03/21/15 -     Discontinue: fentaNYL (DURAGESIC - DOSED MCG/HR) 12 MCG/HR;  Place 1 patch (12.5 mcg total) onto the skin every 3 (three) days. Please fill on or after 03/21/15 -     fentaNYL (DURAGESIC - DOSED MCG/HR) 12 MCG/HR; Place 1 patch (12.5 mcg total) onto the skin every 3 (three) days. Please fill on or after 04/20/15 -     HYDROcodone-acetaminophen (NORCO) 10-325 MG per tablet; Take 1 tablet by mouth every 6 (six) hours as needed for moderate pain or severe pain. Please fill on or after 04/20/15  I have discontinued Ms. Walrond's hydrOXYzine, HYDROcodone-acetaminophen, and fentaNYL. I have also changed her fentaNYL and HYDROcodone-acetaminophen. Additionally, I am having her maintain her NON FORMULARY, albuterol, cycloSPORINE, polyethylene glycol, promethazine, SUMAtriptan, naloxegol oxalate, calcitRIOL, levothyroxine, pantoprazole, PARoxetine, traZODone, promethazine, ketoconazole, ALPRAZolam, (DiphenhydrAMINE HCl, Sleep, (ZZZQUIL PO)), gabapentin, and phenytoin.  Meds ordered this encounter  Medications  . gabapentin (NEURONTIN) 100 MG capsule    Sig: TAKE 1 CAPSULE (100 MG TOTAL) BY MOUTH 3 (THREE) TIMES DAILY. 3 MONTHS SUPPLY    Dispense:  270 capsule    Refill:  3  . phenytoin (DILANTIN) 100 MG ER capsule    Sig: 100 mg every morning and 200 mg every night.    Dispense:  270 capsule    Refill:  3  . DISCONTD: HYDROcodone-acetaminophen (NORCO) 10-325 MG per tablet    Sig: Take 1 tablet by mouth every 6 (six) hours as needed for moderate pain or severe pain. Please fill on or after 03/21/15    Dispense:  120 tablet    Refill:  0  . DISCONTD: fentaNYL (DURAGESIC - DOSED MCG/HR) 12 MCG/HR    Sig: Place 1 patch (12.5 mcg total) onto the skin every 3 (three) days. Please fill on or after 03/21/15    Dispense:  10 patch    Refill:  0  . fentaNYL (DURAGESIC - DOSED MCG/HR) 12 MCG/HR    Sig: Place 1 patch (12.5 mcg total) onto the skin every 3 (three) days. Please fill on or after 04/20/15    Dispense:  10 patch    Refill:  0  . HYDROcodone-acetaminophen (NORCO)  10-325 MG per tablet    Sig: Take 1 tablet by mouth every 6 (six) hours as needed for moderate pain or severe pain. Please fill on or after 04/20/15    Dispense:  120 tablet    Refill:  0     Follow-up: No Follow-up on file.  Walker Kehr, MD

## 2015-03-05 NOTE — Assessment & Plan Note (Signed)
Better - head CT ok

## 2015-03-05 NOTE — Assessment & Plan Note (Signed)
9/16 Outpatient CT scan that shows a fracture of C1, C2 and T1. She had no acute neurologic abnormalities on exam. Dr Tomi Bamberger discussed findings with Dr Vertell Limber who recommended Aspen collar with an additional brace (yale).

## 2015-03-05 NOTE — Progress Notes (Signed)
Pre visit review using our clinic review tool, if applicable. No additional management support is needed unless otherwise documented below in the visit note. 

## 2015-03-12 ENCOUNTER — Ambulatory Visit: Payer: Medicare Other | Admitting: Internal Medicine

## 2015-04-07 ENCOUNTER — Ambulatory Visit: Payer: Medicare Other | Admitting: Internal Medicine

## 2015-04-08 ENCOUNTER — Other Ambulatory Visit (INDEPENDENT_AMBULATORY_CARE_PROVIDER_SITE_OTHER): Payer: Medicare Other

## 2015-04-08 ENCOUNTER — Encounter: Payer: Self-pay | Admitting: Internal Medicine

## 2015-04-08 ENCOUNTER — Ambulatory Visit (INDEPENDENT_AMBULATORY_CARE_PROVIDER_SITE_OTHER): Payer: Medicare Other | Admitting: Internal Medicine

## 2015-04-08 VITALS — BP 100/50 | HR 88 | Wt 170.0 lb

## 2015-04-08 DIAGNOSIS — S12100A Unspecified displaced fracture of second cervical vertebra, initial encounter for closed fracture: Secondary | ICD-10-CM

## 2015-04-08 DIAGNOSIS — N390 Urinary tract infection, site not specified: Secondary | ICD-10-CM | POA: Insufficient documentation

## 2015-04-08 DIAGNOSIS — Z720 Tobacco use: Secondary | ICD-10-CM | POA: Diagnosis not present

## 2015-04-08 DIAGNOSIS — R270 Ataxia, unspecified: Secondary | ICD-10-CM

## 2015-04-08 DIAGNOSIS — M5440 Lumbago with sciatica, unspecified side: Secondary | ICD-10-CM

## 2015-04-08 DIAGNOSIS — N10 Acute tubulo-interstitial nephritis: Secondary | ICD-10-CM

## 2015-04-08 LAB — URINALYSIS, ROUTINE W REFLEX MICROSCOPIC
Nitrite: NEGATIVE
Specific Gravity, Urine: >=1.03 — AB (ref 1.000–1.030)
Total Protein, Urine: >=300 — AB
Urine Glucose: NEGATIVE
Urobilinogen, UA: 0.2 (ref 0.0–1.0)
pH: 6 (ref 5.0–8.0)

## 2015-04-08 MED ORDER — HYDROCODONE-ACETAMINOPHEN 10-325 MG PO TABS: 1.0000 | ORAL_TABLET | Freq: Four times a day (QID) | ORAL | Status: DC | PRN

## 2015-04-08 MED ORDER — FLUCONAZOLE 150 MG PO TABS: 150.0000 mg | ORAL_TABLET | Freq: Once | ORAL | Status: DC

## 2015-04-08 MED ORDER — FENTANYL 12 MCG/HR TD PT72: 12.5000 ug | MEDICATED_PATCH | TRANSDERMAL | Status: DC

## 2015-04-08 MED ORDER — KETOCONAZOLE 2 % EX CREA: TOPICAL_CREAM | CUTANEOUS | Status: DC

## 2015-04-08 MED ORDER — CIPROFLOXACIN HCL 500 MG PO TABS: 500.0000 mg | ORAL_TABLET | Freq: Two times a day (BID) | ORAL | Status: DC

## 2015-04-08 NOTE — Progress Notes (Signed)
Subjective:  Patient ID: Kaylee Hansen, female    DOB: 1955-10-14  Age: 59 y.o. MRN: [id number]  CC: No chief complaint on file.   HPI Kaylee Hansen presents for cervical fx - brace is off. C/o dysuria x 3-4 weeks. C/o yeast infection. F/u chronic LBP  Outpatient Prescriptions Prior to Visit  Medication Sig Dispense Refill  . ALPRAZolam (XANAX) 0.25 MG tablet Take 1 tablet (0.25 mg total) by mouth 3 (three) times daily as needed for sleep or anxiety. Take one tablet by mouth 2-3 times daily as needed for anxiety   (3 months supply) (Patient taking differently: Take 0.25 mg by mouth at bedtime. ) 270 tablet 1  . calcitRIOL (ROCALTROL) 0.25 MCG capsule Take 1 capsule (0.25 mcg total) by mouth daily. 90 capsule 3  . cycloSPORINE (RESTASIS) 0.05 % ophthalmic emulsion Place 1 drop into both eyes 2 (two) times daily. 0.4 mL 0  . DiphenhydrAMINE HCl, Sleep, (ZZZQUIL PO) Take 1 tablet by mouth at bedtime.    . gabapentin (NEURONTIN) 100 MG capsule TAKE 1 CAPSULE (100 MG TOTAL) BY MOUTH 3 (THREE) TIMES DAILY. 3 MONTHS SUPPLY 270 capsule 3  . levothyroxine (SYNTHROID, LEVOTHROID) 150 MCG tablet Take 1 tablet (150 mcg total) by mouth daily. 90 tablet 3  . NON FORMULARY Oxygen- use at night as directed     . pantoprazole (PROTONIX) 40 MG tablet Take 1 tablet (40 mg total) by mouth daily. 90 tablet 3  . PARoxetine (PAXIL) 40 MG tablet TAKE 1 TABLET (40 MG TOTAL) BY MOUTH DAILY. 90 tablet 3  . phenytoin (DILANTIN) 100 MG ER capsule 100 mg every morning and 200 mg every night. 270 capsule 3  . polyethylene glycol (MIRALAX / GLYCOLAX) packet Take 17 g by mouth daily as needed for mild constipation.    . promethazine (PHENERGAN) 25 MG tablet Take 25 mg by mouth daily as needed for nausea or vomiting.     . promethazine (PHENERGAN) 25 MG tablet Take 1 tablet (25 mg total) by mouth daily. 90 tablet 1  . SUMAtriptan (IMITREX) 100 MG tablet Take 1 tablet (100 mg total) by mouth every 2 (two) hours as  needed for migraine or headache. May repeat in 2 hours if headache persists or recurs. 10 tablet 3  . traZODone (DESYREL) 100 MG tablet Take 1 tablet (100 mg total) by mouth at bedtime. 90 tablet 3  . fentaNYL (DURAGESIC - DOSED MCG/HR) 12 MCG/HR Place 1 patch (12.5 mcg total) onto the skin every 3 (three) days. Please fill on or after 04/20/15 10 patch 0  . HYDROcodone-acetaminophen (NORCO) 10-325 MG per tablet Take 1 tablet by mouth every 6 (six) hours as needed for moderate pain or severe pain. Please fill on or after 04/20/15 120 tablet 0  . ketoconazole (NIZORAL) 2 % cream APPLY TOPICALLY DAILY. 45 g 1  . albuterol (PROVENTIL HFA;VENTOLIN HFA) 108 (90 BASE) MCG/ACT inhaler Inhale 2 puffs into the lungs every 6 (six) hours as needed for wheezing. 1 Inhaler 5  . Naloxegol Oxalate (MOVANTIK) 25 MG TABS Take 1 tablet by mouth daily. (Patient not taking: Reported on 04/08/2015) 30 tablet 11   No facility-administered medications prior to visit.    ROS Review of Systems  Constitutional: Positive for fatigue. Negative for chills, activity change, appetite change and unexpected weight change.  HENT: Negative for congestion, mouth sores and sinus pressure.   Eyes: Negative for visual disturbance.  Respiratory: Negative for cough and chest tightness.   Gastrointestinal:  Negative for nausea and abdominal pain.  Genitourinary: Positive for dysuria and urgency. Negative for frequency, difficulty urinating and vaginal pain.  Musculoskeletal: Positive for back pain, arthralgias, gait problem, neck pain and neck stiffness.  Skin: Negative for pallor and rash.  Neurological: Negative for dizziness, tremors, syncope, weakness, numbness and headaches.  Psychiatric/Behavioral: Negative for suicidal ideas, confusion and sleep disturbance. The patient is not nervous/anxious.     Objective:  BP 100/50 mmHg  Pulse 88  Wt 170 lb (77.111 kg)  SpO2 92%  BP Readings from Last 3 Encounters:  04/08/15 100/50    03/05/15 110/42  02/25/15 139/43    Wt Readings from Last 3 Encounters:  04/08/15 170 lb (77.111 kg)  03/05/15 171 lb (77.565 kg)  02/25/15 170 lb (77.111 kg)    Physical Exam  Constitutional: She appears well-developed. No distress.  HENT:  Head: Normocephalic.  Right Ear: External ear normal.  Left Ear: External ear normal.  Nose: Nose normal.  Mouth/Throat: Oropharynx is clear and moist. No oropharyngeal exudate.  Eyes: Conjunctivae are normal. Pupils are equal, round, and reactive to light. Right eye exhibits no discharge. Left eye exhibits no discharge.  Neck: Normal range of motion. Neck supple. No JVD present. No tracheal deviation present. No thyromegaly present.  Cardiovascular: Normal rate, regular rhythm and normal heart sounds.   Pulmonary/Chest: No stridor. No respiratory distress. She has no wheezes.  Abdominal: Soft. Bowel sounds are normal. She exhibits no distension and no mass. There is no tenderness. There is no rebound and no guarding.  Musculoskeletal: She exhibits tenderness. She exhibits no edema.  Lymphadenopathy:    She has no cervical adenopathy.  Neurological: She displays normal reflexes. No cranial nerve deficit. She exhibits normal muscle tone. Coordination normal.  Skin: No rash noted. No erythema.  Psychiatric: She has a normal mood and affect. Her behavior is normal. Judgment and thought content normal.  in a w/c  Lab Results  Component Value Date   WBC 7.2 01/02/2014   HGB 11.7* 01/02/2014   HCT 34.9* 01/02/2014   PLT 268.0 01/02/2014   GLUCOSE 98 01/02/2014   CHOL 339* 08/12/2009   TRIG 298.0* 08/12/2009   HDL 43.10 08/12/2009   LDLDIRECT 232.7 08/12/2009   LDLCALC * 05/12/2009    240        Total Cholesterol/HDL:CHD Risk Coronary Heart Disease Risk Table                     Men   Women  1/2 Average Risk   3.4   3.3  Average Risk       5.0   4.4  2 X Average Risk   9.6   7.1  3 X Average Risk  23.4   11.0        Use the  calculated Patient Ratio above and the CHD Risk Table to determine the patient's CHD Risk.        ATP III CLASSIFICATION (LDL):  <100     mg/dL   Optimal  100-129  mg/dL   Near or Above                    Optimal  130-159  mg/dL   Borderline  160-189  mg/dL   High  >190     mg/dL   Very High   ALT 11 01/02/2014   AST 14 01/02/2014   NA 136 01/02/2014   K 4.5 01/02/2014   CL 100 01/02/2014  CREATININE 1.0 01/02/2014   BUN 11 01/02/2014   CO2 30 01/02/2014   TSH 0.22* 01/02/2014   INR 1.0 09/16/2008   HGBA1C  09/17/2008    5.0 (NOTE)   The ADA recommends the following therapeutic goal for glycemic   control related to Hgb A1C measurement:   Goal of Therapy:   < 7.0% Hgb A1C   Reference: American Diabetes Association: Clinical Practice   Recommendations 2008, Diabetes Care,  2008, 31:(Suppl 1).    Ct Head Wo Contrast  02/25/2015  CLINICAL DATA:  Fall down steps yesterday with head and neck pain. Loss of conscious 30 min. Frequent falls. EXAM: CT HEAD WITHOUT CONTRAST CT CERVICAL SPINE WITHOUT CONTRAST TECHNIQUE: Multidetector CT imaging of the head and cervical spine was performed following the standard protocol without intravenous contrast. Multiplanar CT image reconstructions of the cervical spine were also generated. COMPARISON:  Head CT 12/2013 FINDINGS: CT HEAD FINDINGS There is mild motion artifact present over the inferior images. Ventricles, cisterns and other CSF spaces are within normal. There is subtle chronic ischemic microvascular disease. There is no mass, mass effect, shift of midline structures or acute hemorrhage. Bones and soft tissues are within normal. CT CERVICAL SPINE FINDINGS Vertebral body alignment, heights and disc space heights within the cervical spine are normal. There is minimal spondylosis. There is a prominent left posterior osteophyte at the C5-6 level. Facet arthropathy is present. Prevertebral soft tissues are normal. There is a nondisplaced fracture  through the anterior arch of C1 just right of midline. There is also a very subtle nondisplaced fracture along the lateral aspect of the right lateral mass of C2. There is an acute moderate compression/burst fracture of T2. There is a small fragment of this T2 fracture in the midline anterior canal without significant mass effect on the cord or canal. Subtle fracture of the tip of the T2 spinous process. Mild emphysematous change over the upper lungs. IMPRESSION: No acute intracranial findings. Subtle chronic ischemic microvascular disease. Nondisplaced fracture of the anterior arch of C1 just right of midline as well as nondisplaced fracture along the lateral aspect of the lateral mass of C2. Acute moderate compression/burst fracture of T2. Small fragment over the midline anterior aspect of the spinal canal without significant canal stenosis or cord compression. Subtle fracture of the tip of the T2 spinous process. Mild spondylosis of the cervical spine. Critical Value/emergent results were called by telephone at the time of interpretation on 02/25/2015 at 12:05 pm to Dr. Lew Dawes , who verbally acknowledged these results. Electronically Signed   By: Marin Olp M.D.   On: 02/25/2015 12:06   Ct Cervical Spine Wo Contrast  02/25/2015  CLINICAL DATA:  Fall down steps yesterday with head and neck pain. Loss of conscious 30 min. Frequent falls. EXAM: CT HEAD WITHOUT CONTRAST CT CERVICAL SPINE WITHOUT CONTRAST TECHNIQUE: Multidetector CT imaging of the head and cervical spine was performed following the standard protocol without intravenous contrast. Multiplanar CT image reconstructions of the cervical spine were also generated. COMPARISON:  Head CT 12/2013 FINDINGS: CT HEAD FINDINGS There is mild motion artifact present over the inferior images. Ventricles, cisterns and other CSF spaces are within normal. There is subtle chronic ischemic microvascular disease. There is no mass, mass effect, shift of midline  structures or acute hemorrhage. Bones and soft tissues are within normal. CT CERVICAL SPINE FINDINGS Vertebral body alignment, heights and disc space heights within the cervical spine are normal. There is minimal spondylosis. There is a prominent  left posterior osteophyte at the C5-6 level. Facet arthropathy is present. Prevertebral soft tissues are normal. There is a nondisplaced fracture through the anterior arch of C1 just right of midline. There is also a very subtle nondisplaced fracture along the lateral aspect of the right lateral mass of C2. There is an acute moderate compression/burst fracture of T2. There is a small fragment of this T2 fracture in the midline anterior canal without significant mass effect on the cord or canal. Subtle fracture of the tip of the T2 spinous process. Mild emphysematous change over the upper lungs. IMPRESSION: No acute intracranial findings. Subtle chronic ischemic microvascular disease. Nondisplaced fracture of the anterior arch of C1 just right of midline as well as nondisplaced fracture along the lateral aspect of the lateral mass of C2. Acute moderate compression/burst fracture of T2. Small fragment over the midline anterior aspect of the spinal canal without significant canal stenosis or cord compression. Subtle fracture of the tip of the T2 spinous process. Mild spondylosis of the cervical spine. Critical Value/emergent results were called by telephone at the time of interpretation on 02/25/2015 at 12:05 pm to Dr. Lew Dawes , who verbally acknowledged these results. Electronically Signed   By: Marin Olp M.D.   On: 02/25/2015 12:06   Dg Hand Complete Right  02/25/2015  CLINICAL DATA:  Patient lost her balance and fell on the stairs at home yesterday, fell down 5 stairs and lost consciousness. Patient sustained multiple cervical spine fractures. Pain, bruising and swelling of the long and ring fingers of the right hand. Initial encounter. EXAM: RIGHT HAND -  COMPLETE 3+ VIEW COMPARISON:  None. FINDINGS: Nondisplaced fracture involving the base of middle phalanx of the long finger. A similar nondisplaced fracture involving the base of the middle phalanx of the ring finger is suspected. No fractures elsewhere. Mild joint space narrowing involving the IP joints of essentially all of the fingers, worst involving the PIP joints of the ring and small fingers. Similar joint space narrowing involving the 5th MCP joint. IMPRESSION: 1. Nondisplaced fracture involving the base the middle phalanx the long finger. 2. Nondisplaced fracture involving the base of the middle phalanx of the ring finger is suspected. 3. Mild osteoarthritis. Electronically Signed   By: Evangeline Dakin M.D.   On: 02/25/2015 13:59    Assessment & Plan:   Diagnoses and all orders for this visit:  Ataxia  Tobacco abuse disorder  Acute pyelonephritis -     Urinalysis; Future  Other fracture of second cervical vertebra (HCC)  Low back pain with sciatica, sciatica laterality unspecified, unspecified back pain laterality, unspecified chronicity  Other orders -     Discontinue: fentaNYL (DURAGESIC - DOSED MCG/HR) 12 MCG/HR; Place 1 patch (12.5 mcg total) onto the skin every 3 (three) days. Please fill on or after 05/21/15 -     Discontinue: HYDROcodone-acetaminophen (NORCO) 10-325 MG tablet; Take 1 tablet by mouth every 6 (six) hours as needed for moderate pain or severe pain. Please fill on or after 05/21/15 -     Discontinue: HYDROcodone-acetaminophen (NORCO) 10-325 MG tablet; Take 1 tablet by mouth every 6 (six) hours as needed for moderate pain or severe pain. Please fill on or after 06/20/15 -     Discontinue: fentaNYL (DURAGESIC - DOSED MCG/HR) 12 MCG/HR; Place 1 patch (12.5 mcg total) onto the skin every 3 (three) days. Please fill on or after 06/20/15 -     fentaNYL (DURAGESIC - DOSED MCG/HR) 12 MCG/HR; Place 1 patch (12.5 mcg  total) onto the skin every 3 (three) days. Please fill on  or after 07/21/15 -     HYDROcodone-acetaminophen (NORCO) 10-325 MG tablet; Take 1 tablet by mouth every 6 (six) hours as needed for moderate pain or severe pain. Please fill on or after 07/21/15 -     ciprofloxacin (CIPRO) 500 MG tablet; Take 1 tablet (500 mg total) by mouth 2 (two) times daily. -     fluconazole (DIFLUCAN) 150 MG tablet; Take 1 tablet (150 mg total) by mouth once. -     ketoconazole (NIZORAL) 2 % cream; APPLY TOPICALLY DAILY.  I have discontinued Ms. Christiansen's fentaNYL, HYDROcodone-acetaminophen, fentaNYL, and HYDROcodone-acetaminophen. I have also changed her fentaNYL and HYDROcodone-acetaminophen. Additionally, I am having her start on ciprofloxacin and fluconazole. Lastly, I am having her maintain her NON FORMULARY, albuterol, cycloSPORINE, polyethylene glycol, promethazine, SUMAtriptan, naloxegol oxalate, calcitRIOL, levothyroxine, pantoprazole, PARoxetine, traZODone, promethazine, ALPRAZolam, (DiphenhydrAMINE HCl, Sleep, (ZZZQUIL PO)), gabapentin, phenytoin, and ketoconazole.  Meds ordered this encounter  Medications  . DISCONTD: fentaNYL (DURAGESIC - DOSED MCG/HR) 12 MCG/HR    Sig: Place 1 patch (12.5 mcg total) onto the skin every 3 (three) days. Please fill on or after 05/21/15    Dispense:  10 patch    Refill:  0  . DISCONTD: HYDROcodone-acetaminophen (NORCO) 10-325 MG tablet    Sig: Take 1 tablet by mouth every 6 (six) hours as needed for moderate pain or severe pain. Please fill on or after 05/21/15    Dispense:  120 tablet    Refill:  0  . DISCONTD: HYDROcodone-acetaminophen (NORCO) 10-325 MG tablet    Sig: Take 1 tablet by mouth every 6 (six) hours as needed for moderate pain or severe pain. Please fill on or after 06/20/15    Dispense:  120 tablet    Refill:  0  . DISCONTD: fentaNYL (DURAGESIC - DOSED MCG/HR) 12 MCG/HR    Sig: Place 1 patch (12.5 mcg total) onto the skin every 3 (three) days. Please fill on or after 06/20/15    Dispense:  10 patch    Refill:  0    . fentaNYL (DURAGESIC - DOSED MCG/HR) 12 MCG/HR    Sig: Place 1 patch (12.5 mcg total) onto the skin every 3 (three) days. Please fill on or after 07/21/15    Dispense:  10 patch    Refill:  0  . HYDROcodone-acetaminophen (NORCO) 10-325 MG tablet    Sig: Take 1 tablet by mouth every 6 (six) hours as needed for moderate pain or severe pain. Please fill on or after 07/21/15    Dispense:  120 tablet    Refill:  0  . ciprofloxacin (CIPRO) 500 MG tablet    Sig: Take 1 tablet (500 mg total) by mouth 2 (two) times daily.    Dispense:  20 tablet    Refill:  0  . fluconazole (DIFLUCAN) 150 MG tablet    Sig: Take 1 tablet (150 mg total) by mouth once.    Dispense:  1 tablet    Refill:  2  . ketoconazole (NIZORAL) 2 % cream    Sig: APPLY TOPICALLY DAILY.    Dispense:  45 g    Refill:  1     Follow-up: Return in about 3 months (around 07/09/2015) for a follow-up visit.  Walker Kehr, MD

## 2015-04-08 NOTE — Assessment & Plan Note (Signed)
Chronic - better w/less meds

## 2015-04-08 NOTE — Assessment & Plan Note (Signed)
Brace is off Dr Vertell Limber

## 2015-04-08 NOTE — Progress Notes (Signed)
Pre visit review using our clinic review tool, if applicable. No additional management support is needed unless otherwise documented below in the visit note. 

## 2015-04-08 NOTE — Assessment & Plan Note (Signed)
Chronic Norco, Fentanyl  Potential benefits of a long term opioids use as well as potential risks (i.e. addiction risk, apnea etc) and complications (i.e. Somnolence, constipation and others) were explained to the patient and were aknowledged. 

## 2015-04-08 NOTE — Assessment & Plan Note (Signed)
Smoking 1/2 ppd - discussed

## 2015-05-13 ENCOUNTER — Telehealth: Payer: Self-pay

## 2015-05-13 NOTE — Telephone Encounter (Signed)
Can the follow up appt in January be changed to an AWV?

## 2015-05-14 NOTE — Telephone Encounter (Signed)
Can you please to the change listed below?

## 2015-05-14 NOTE — Telephone Encounter (Signed)
Yes. Thx.

## 2015-05-14 NOTE — Telephone Encounter (Signed)
lmovm for patient to call back to ok her appointment on 1/18 to be changed from 10:15 to 9:45

## 2015-06-01 NOTE — Telephone Encounter (Signed)
Left another vm verifying appt change

## 2015-06-14 ENCOUNTER — Emergency Department (HOSPITAL_COMMUNITY): Payer: Medicare Other

## 2015-06-14 ENCOUNTER — Encounter (HOSPITAL_COMMUNITY): Payer: Self-pay | Admitting: Nurse Practitioner

## 2015-06-14 ENCOUNTER — Emergency Department (HOSPITAL_COMMUNITY)
Admission: EM | Admit: 2015-06-14 | Discharge: 2015-06-14 | Disposition: A | Discharge status: Home / Self Care | Payer: Medicare Other | Attending: Emergency Medicine | Admitting: Emergency Medicine

## 2015-06-14 DIAGNOSIS — S79911A Unspecified injury of right hip, initial encounter: Secondary | ICD-10-CM | POA: Insufficient documentation

## 2015-06-14 DIAGNOSIS — Z90710 Acquired absence of both cervix and uterus: Secondary | ICD-10-CM | POA: Diagnosis not present

## 2015-06-14 DIAGNOSIS — Z8619 Personal history of other infectious and parasitic diseases: Secondary | ICD-10-CM | POA: Diagnosis not present

## 2015-06-14 DIAGNOSIS — F17210 Nicotine dependence, cigarettes, uncomplicated: Secondary | ICD-10-CM | POA: Diagnosis not present

## 2015-06-14 DIAGNOSIS — J449 Chronic obstructive pulmonary disease, unspecified: Secondary | ICD-10-CM | POA: Insufficient documentation

## 2015-06-14 DIAGNOSIS — E039 Hypothyroidism, unspecified: Secondary | ICD-10-CM | POA: Insufficient documentation

## 2015-06-14 DIAGNOSIS — W1839XA Other fall on same level, initial encounter: Secondary | ICD-10-CM | POA: Insufficient documentation

## 2015-06-14 DIAGNOSIS — Y92190 Kitchen in other specified residential institution as the place of occurrence of the external cause: Secondary | ICD-10-CM | POA: Insufficient documentation

## 2015-06-14 DIAGNOSIS — Z8673 Personal history of transient ischemic attack (TIA), and cerebral infarction without residual deficits: Secondary | ICD-10-CM | POA: Insufficient documentation

## 2015-06-14 DIAGNOSIS — K5900 Constipation, unspecified: Secondary | ICD-10-CM | POA: Diagnosis not present

## 2015-06-14 DIAGNOSIS — Z86018 Personal history of other benign neoplasm: Secondary | ICD-10-CM | POA: Insufficient documentation

## 2015-06-14 DIAGNOSIS — T148 Other injury of unspecified body region: Secondary | ICD-10-CM | POA: Diagnosis not present

## 2015-06-14 DIAGNOSIS — Z85118 Personal history of other malignant neoplasm of bronchus and lung: Secondary | ICD-10-CM | POA: Insufficient documentation

## 2015-06-14 DIAGNOSIS — Z792 Long term (current) use of antibiotics: Secondary | ICD-10-CM | POA: Insufficient documentation

## 2015-06-14 DIAGNOSIS — M170 Bilateral primary osteoarthritis of knee: Secondary | ICD-10-CM | POA: Diagnosis not present

## 2015-06-14 DIAGNOSIS — Z9049 Acquired absence of other specified parts of digestive tract: Secondary | ICD-10-CM | POA: Diagnosis not present

## 2015-06-14 DIAGNOSIS — F419 Anxiety disorder, unspecified: Secondary | ICD-10-CM | POA: Diagnosis not present

## 2015-06-14 DIAGNOSIS — Y9389 Activity, other specified: Secondary | ICD-10-CM | POA: Insufficient documentation

## 2015-06-14 DIAGNOSIS — Y998 Other external cause status: Secondary | ICD-10-CM | POA: Diagnosis not present

## 2015-06-14 DIAGNOSIS — F329 Major depressive disorder, single episode, unspecified: Secondary | ICD-10-CM | POA: Insufficient documentation

## 2015-06-14 DIAGNOSIS — K219 Gastro-esophageal reflux disease without esophagitis: Secondary | ICD-10-CM | POA: Diagnosis not present

## 2015-06-14 DIAGNOSIS — S299XXA Unspecified injury of thorax, initial encounter: Secondary | ICD-10-CM | POA: Diagnosis present

## 2015-06-14 DIAGNOSIS — Z880 Allergy status to penicillin: Secondary | ICD-10-CM | POA: Insufficient documentation

## 2015-06-14 DIAGNOSIS — Z8669 Personal history of other diseases of the nervous system and sense organs: Secondary | ICD-10-CM | POA: Diagnosis not present

## 2015-06-14 DIAGNOSIS — Z79899 Other long term (current) drug therapy: Secondary | ICD-10-CM | POA: Diagnosis not present

## 2015-06-14 DIAGNOSIS — T148XXA Other injury of unspecified body region, initial encounter: Secondary | ICD-10-CM

## 2015-06-14 MED ORDER — DIAZEPAM 5 MG PO TABS
5.0000 mg | ORAL_TABLET | Freq: Once | ORAL | Status: AC
  Administered 2015-06-14: 5 mg via ORAL
  Filled 2015-06-14: qty 1

## 2015-06-14 MED ORDER — MORPHINE SULFATE (PF) 4 MG/ML IV SOLN
4.0000 mg | Freq: Once | INTRAVENOUS | Status: AC
  Administered 2015-06-14: 4 mg via INTRAMUSCULAR
  Filled 2015-06-14: qty 1

## 2015-06-14 NOTE — ED Provider Notes (Signed)
CSN: [id number]     Arrival date & time 06/14/15  2059 History   First MD Initiated Contact with Patient 06/14/15 2131     Chief Complaint  Patient presents with  . Fall     (Consider location/radiation/quality/duration/timing/severity/associated sxs/prior Treatment) HPI Comments: Patient here after having a mechanical fall prior to arrival while putting up curtains in her kitchen. Denies any head or neck injury. Fell onto her right hip as well as right ribs. She was able to ambulate. Denies any dyspnea but does endorse sharp right-sided rib pain is worse with movement. No abdominal discomfort. Denies any numbness or tingling in her legs. Symptoms worse with movement better with rest. Called EMS and transported here  Patient is a 59 y.o. female presenting with fall. The history is provided by the patient and the spouse.  Fall    Past Medical History  Diagnosis Date  . Hx of cancer of lung 1999  . History of cholecystectomy   . Hx of appendectomy   . Hx of hysterectomy   . COPD (chronic obstructive pulmonary disease) ([place])   . Low back pain   . Osteoarthritis of knee     bilateral knee  . Seizures ([place])   . Hypothyroidism   . Anxiety   . Depression   . Stroke [facility])     CVA, hx of 97  . Hyperlipidemia   . Collagenous colitis   . Esophageal ulcer     04/2009 EGD  . Esophageal ulcer 04/2009  . Helicobacter pylori gastritis 2010    Pylera Tx  . Constipation     Chronic abdominal pain and constipation  . Colon adenomas 2011  . Diverticulosis   . GERD (gastroesophageal reflux disease)   . Esophageal stricture 11/08/2012  . 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 Appendectomy    . Abdominal hysterectomy      complete 1992  . Cholecystectomy    . Total hip arthroplasty      Left  . Abdominal surgery      Exploratory  . Esophagogastroduodenoscopy      w/baloon x 2  . Colonoscopy w/ biopsies      multiple  .  Esophagogastroduodenoscopy N/A 11/08/2012    Procedure: ESOPHAGOGASTRODUODENOSCOPY (EGD);  Surgeon: Gatha Mayer, MD;  Location: [facility];  Service: Endoscopy;  Laterality: N/A;  . Balloon dilation N/A 11/08/2012    Procedure: BALLOON DILATION;  Surgeon: Gatha Mayer, MD;  Location: [facility] ENDOSCOPY;  Service: Endoscopy;  Laterality: N/A;   Family History  Problem Relation Age of Onset  . Coronary artery disease Mother   . Diabetes Mother   . Diabetes Son   . Colon cancer Neg Hx     colon  . Coronary artery disease Other     grandmother, grandfather  . Kidney disease Other     aunt  . Hypertension Father    Social History  Substance Use Topics  . Smoking status: Current Every Day Smoker -- 0.50 packs/day    Types: Cigarettes  . Smokeless tobacco: Never Used  . Alcohol Use: No   OB History    No data available     Review of Systems  All other systems reviewed and are negative.     Allergies  Atorvastatin; Morphine; Oxycodone hcl; Penicillins; and Tape  Home Medications   Prior to Admission medications   Medication Sig Start Date End Date Taking? Authorizing Provider  albuterol (PROVENTIL HFA;VENTOLIN HFA)  108 (90 BASE) MCG/ACT inhaler Inhale 2 puffs into the lungs every 6 (six) hours as needed for wheezing. 11/03/11 11/02/12  Aleksei Plotnikov V, MD  ALPRAZolam (XANAX) 0.25 MG tablet Take 1 tablet (0.25 mg total) by mouth 3 (three) times daily as needed for sleep or anxiety. Take one tablet by mouth 2-3 times daily as needed for anxiety   (3 months supply) Patient taking differently: Take 0.25 mg by mouth at bedtime.  02/25/15   Aleksei Plotnikov V, MD  calcitRIOL (ROCALTROL) 0.25 MCG capsule Take 1 capsule (0.25 mcg total) by mouth daily. 08/06/14   Aleksei Plotnikov V, MD  ciprofloxacin (CIPRO) 500 MG tablet Take 1 tablet (500 mg total) by mouth 2 (two) times daily. 04/08/15   Aleksei Plotnikov V, MD  cycloSPORINE (RESTASIS) 0.05 % ophthalmic emulsion Place 1 drop into  both eyes 2 (two) times daily. 07/03/12   Aleksei Plotnikov V, MD  DiphenhydrAMINE HCl, Sleep, (ZZZQUIL PO) Take 1 tablet by mouth at bedtime.    Historical Provider, MD  fentaNYL (DURAGESIC - DOSED MCG/HR) 12 MCG/HR Place 1 patch (12.5 mcg total) onto the skin every 3 (three) days. Please fill on or after 07/21/15 04/08/15   Lew Dawes V, MD  fluconazole (DIFLUCAN) 150 MG tablet Take 1 tablet (150 mg total) by mouth once. 04/08/15   Aleksei Plotnikov V, MD  gabapentin (NEURONTIN) 100 MG capsule TAKE 1 CAPSULE (100 MG TOTAL) BY MOUTH 3 (THREE) TIMES DAILY. 3 MONTHS SUPPLY 03/05/15   Lew Dawes V, MD  HYDROcodone-acetaminophen (NORCO) 10-325 MG tablet Take 1 tablet by mouth every 6 (six) hours as needed for moderate pain or severe pain. Please fill on or after 07/21/15 04/08/15   Aleksei Plotnikov V, MD  ketoconazole (NIZORAL) 2 % cream APPLY TOPICALLY DAILY. 04/08/15   Aleksei Plotnikov V, MD  levothyroxine (SYNTHROID, LEVOTHROID) 150 MCG tablet Take 1 tablet (150 mcg total) by mouth daily. 08/06/14   Aleksei Plotnikov V, MD  Naloxegol Oxalate (MOVANTIK) 25 MG TABS Take 1 tablet by mouth daily. Patient not taking: Reported on 04/08/2015 05/01/14   Cassandria Anger, MD  NON FORMULARY Oxygen- use at night as directed     Historical Provider, MD  pantoprazole (PROTONIX) 40 MG tablet Take 1 tablet (40 mg total) by mouth daily. 08/06/14   Aleksei Plotnikov V, MD  PARoxetine (PAXIL) 40 MG tablet TAKE 1 TABLET (40 MG TOTAL) BY MOUTH DAILY. 10/05/14   Aleksei Plotnikov V, MD  phenytoin (DILANTIN) 100 MG ER capsule 100 mg every morning and 200 mg every night. 03/05/15   Aleksei Plotnikov V, MD  polyethylene glycol (MIRALAX / GLYCOLAX) packet Take 17 g by mouth daily as needed for mild constipation.    Historical Provider, MD  promethazine (PHENERGAN) 25 MG tablet Take 25 mg by mouth daily as needed for nausea or vomiting.  01/18/12   Rowe Clack, MD  promethazine (PHENERGAN) 25 MG tablet Take 1  tablet (25 mg total) by mouth daily. 10/05/14   Aleksei Plotnikov V, MD  SUMAtriptan (IMITREX) 100 MG tablet Take 1 tablet (100 mg total) by mouth every 2 (two) hours as needed for migraine or headache. May repeat in 2 hours if headache persists or recurs. 01/29/14   Aleksei Plotnikov V, MD  traZODone (DESYREL) 100 MG tablet Take 1 tablet (100 mg total) by mouth at bedtime. 10/05/14   Aleksei Plotnikov V, MD   BP 164/56 mmHg  Pulse 90  Temp(Src) 97.9 F (36.6 C) (Oral)  Resp 18  SpO2 98% Physical Exam  Constitutional: She is oriented to person, place, and time. She appears well-developed and well-nourished.  Non-toxic appearance. No distress.  HENT:  Head: Normocephalic and atraumatic.  Eyes: Conjunctivae, EOM and lids are normal. Pupils are equal, round, and reactive to light.  Neck: Normal range of motion. Neck supple. No tracheal deviation present. No thyroid mass present.  Cardiovascular: Normal rate, regular rhythm and normal heart sounds.  Exam reveals no gallop.   No murmur heard. Pulmonary/Chest: Effort normal and breath sounds normal. No stridor. No respiratory distress. She has no decreased breath sounds. She has no wheezes. She has no rhonchi. She has no rales.    Abdominal: Soft. Normal appearance and bowel sounds are normal. She exhibits no distension. There is no tenderness. There is no rebound and no CVA tenderness.  Musculoskeletal: Normal range of motion. She exhibits no edema or tenderness.       Legs: Neurological: She is alert and oriented to person, place, and time. She has normal strength. No cranial nerve deficit or sensory deficit. GCS eye subscore is 4. GCS verbal subscore is 5. GCS motor subscore is 6.  Skin: Skin is warm and dry. No abrasion and no rash noted.  Psychiatric: She has a normal mood and affect. Her speech is normal and behavior is normal.  Nursing note and vitals reviewed.   ED Course  Procedures (including critical care time) Labs Review Labs  Reviewed - No data to display  Imaging Review Dg Hip Unilat  With Pelvis 2-3 Views Right  06/14/2015  CLINICAL DATA:  59 year old female with fall and right hip pain. 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 No acute fracture or dislocation. 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 The soft tissues are grossly unremarkable. IMPRESSION: No acute fracture or dislocation. Electronically Signed   By: Anner Crete M.D.   On: 06/14/2015 21:32   I have personally reviewed and evaluated these images and lab results as part of my medical decision-making.   EKG Interpretation None      MDM   Final diagnoses:  None    Patient given pain meds here and feels better. X-rays are without acute injury. Stable for discharge    Lacretia Leigh, MD 06/14/15 2333

## 2015-06-14 NOTE — ED Notes (Signed)
Patient fell at home while putting up curtains. Now complains of pain 'all over' worsening on right hip. Wearing aspen c-collar due to fall from porch with cervical fracture 9 weeks ago. No loss of consciousness or change in alertness. Patient states she believes fall was mechanical. No blood thinner use. Wearing fentanyl patch on left arm.

## 2015-06-14 NOTE — ED Notes (Signed)
Bed: WA09 Expected date:  Expected time:  Means of arrival:  Comments: EMS 59 yo female fall

## 2015-06-14 NOTE — Discharge Instructions (Signed)

## 2015-07-06 NOTE — Telephone Encounter (Signed)
Pt aware.

## 2015-07-14 ENCOUNTER — Other Ambulatory Visit (INDEPENDENT_AMBULATORY_CARE_PROVIDER_SITE_OTHER): Payer: Medicare Other

## 2015-07-14 ENCOUNTER — Ambulatory Visit (INDEPENDENT_AMBULATORY_CARE_PROVIDER_SITE_OTHER): Payer: Medicare Other | Admitting: Internal Medicine

## 2015-07-14 ENCOUNTER — Encounter: Payer: Self-pay | Admitting: Internal Medicine

## 2015-07-14 ENCOUNTER — Ambulatory Visit: Payer: Medicare Other | Admitting: Internal Medicine

## 2015-07-14 VITALS — BP 90/42 | HR 83 | Ht 65.0 in | Wt 169.0 lb

## 2015-07-14 DIAGNOSIS — I10 Essential (primary) hypertension: Secondary | ICD-10-CM

## 2015-07-14 DIAGNOSIS — N3000 Acute cystitis without hematuria: Secondary | ICD-10-CM | POA: Diagnosis not present

## 2015-07-14 DIAGNOSIS — F411 Generalized anxiety disorder: Secondary | ICD-10-CM

## 2015-07-14 DIAGNOSIS — Z23 Encounter for immunization: Secondary | ICD-10-CM

## 2015-07-14 DIAGNOSIS — M5440 Lumbago with sciatica, unspecified side: Secondary | ICD-10-CM

## 2015-07-14 DIAGNOSIS — K219 Gastro-esophageal reflux disease without esophagitis: Secondary | ICD-10-CM

## 2015-07-14 DIAGNOSIS — F321 Major depressive disorder, single episode, moderate: Secondary | ICD-10-CM | POA: Diagnosis not present

## 2015-07-14 DIAGNOSIS — E2749 Other adrenocortical insufficiency: Secondary | ICD-10-CM

## 2015-07-14 DIAGNOSIS — Z0000 Encounter for general adult medical examination without abnormal findings: Secondary | ICD-10-CM

## 2015-07-14 DIAGNOSIS — R296 Repeated falls: Secondary | ICD-10-CM | POA: Diagnosis not present

## 2015-07-14 DIAGNOSIS — R7989 Other specified abnormal findings of blood chemistry: Secondary | ICD-10-CM

## 2015-07-14 LAB — CBC WITH DIFFERENTIAL/PLATELET
Basophils Absolute: 0.1 10*3/uL (ref 0.0–0.1)
Basophils Relative: 1.1 % (ref 0.0–3.0)
Eosinophils Absolute: 0.2 10*3/uL (ref 0.0–0.7)
Eosinophils Relative: 3.2 % (ref 0.0–5.0)
HCT: 36.1 % (ref 36.0–46.0)
Hemoglobin: 12 g/dL (ref 12.0–15.0)
Lymphocytes Relative: 23.6 % (ref 12.0–46.0)
Lymphs Abs: 1.8 10*3/uL (ref 0.7–4.0)
MCHC: 33.2 g/dL (ref 30.0–36.0)
MCV: 97.3 fl (ref 78.0–100.0)
Monocytes Absolute: 0.5 10*3/uL (ref 0.1–1.0)
Monocytes Relative: 6.9 % (ref 3.0–12.0)
Neutro Abs: 5.1 10*3/uL (ref 1.4–7.7)
Neutrophils Relative %: 65.2 % (ref 43.0–77.0)
Platelets: 278 10*3/uL (ref 150.0–400.0)
RBC: 3.71 Mil/uL — ABNORMAL LOW (ref 3.87–5.11)
RDW: 15 % (ref 11.5–15.5)
WBC: 7.8 10*3/uL (ref 4.0–10.5)

## 2015-07-14 LAB — LDL CHOLESTEROL, DIRECT: Direct LDL: 187 mg/dL

## 2015-07-14 LAB — LIPID PANEL
Cholesterol: 310 mg/dL — ABNORMAL HIGH (ref 0–200)
HDL: 38 mg/dL — ABNORMAL LOW (ref >39.00)
NonHDL: 271.95
Total CHOL/HDL Ratio: 8
Triglycerides: 300 mg/dL — ABNORMAL HIGH (ref 0.0–149.0)
VLDL: 60 mg/dL — ABNORMAL HIGH (ref 0.0–40.0)

## 2015-07-14 LAB — CORTISOL: Cortisol, Plasma: 7.4 ug/dL

## 2015-07-14 LAB — HEPATIC FUNCTION PANEL
ALT: 6 U/L (ref 0–35)
AST: 9 U/L (ref 0–37)
Albumin: 3.5 g/dL (ref 3.5–5.2)
Alkaline Phosphatase: 144 U/L — ABNORMAL HIGH (ref 39–117)
Bilirubin, Direct: 0 mg/dL (ref 0.0–0.3)
Total Bilirubin: 0.2 mg/dL (ref 0.2–1.2)
Total Protein: 6.4 g/dL (ref 6.0–8.3)

## 2015-07-14 LAB — URINALYSIS
Bilirubin Urine: NEGATIVE
Hgb urine dipstick: NEGATIVE
Ketones, ur: NEGATIVE
Leukocytes, UA: NEGATIVE
Nitrite: NEGATIVE
Specific Gravity, Urine: 1.015 (ref 1.000–1.030)
Total Protein, Urine: NEGATIVE
Urine Glucose: NEGATIVE
Urobilinogen, UA: 0.2 (ref 0.0–1.0)
pH: 6 (ref 5.0–8.0)

## 2015-07-14 LAB — BASIC METABOLIC PANEL
BUN: 10 mg/dL (ref 6–23)
CO2: 33 mEq/L — ABNORMAL HIGH (ref 19–32)
Calcium: 9.3 mg/dL (ref 8.4–10.5)
Chloride: 103 mEq/L (ref 96–112)
Creatinine, Ser: 0.89 mg/dL (ref 0.40–1.20)
GFR: 68.4 mL/min (ref >60.00)
Glucose, Bld: 82 mg/dL (ref 70–99)
Potassium: 4.3 mEq/L (ref 3.5–5.1)
Sodium: 141 mEq/L (ref 135–145)

## 2015-07-14 LAB — TSH: TSH: 0.46 u[IU]/mL (ref 0.35–4.50)

## 2015-07-14 LAB — VITAMIN D 25 HYDROXY (VIT D DEFICIENCY, FRACTURES): VITD: 8.82 ng/mL — ABNORMAL LOW (ref 30.00–100.00)

## 2015-07-14 MED ORDER — FENTANYL 12 MCG/HR TD PT72: 12.5000 ug | MEDICATED_PATCH | TRANSDERMAL | Status: DC

## 2015-07-14 MED ORDER — HYDROCODONE-ACETAMINOPHEN 10-325 MG PO TABS: 1.0000 | ORAL_TABLET | Freq: Four times a day (QID) | ORAL | Status: DC | PRN

## 2015-07-14 MED ORDER — PAROXETINE HCL 40 MG PO TABS: ORAL_TABLET | ORAL | Status: DC

## 2015-07-14 MED ORDER — ALBUTEROL SULFATE (2.5 MG/3ML) 0.083% IN NEBU: 2.5000 mg | INHALATION_SOLUTION | Freq: Four times a day (QID) | RESPIRATORY_TRACT | Status: DC

## 2015-07-14 MED ORDER — POLYETHYLENE GLYCOL 3350 17 G PO PACK: 17.0000 g | PACK | Freq: Every day | ORAL | Status: DC | PRN

## 2015-07-14 MED ORDER — KETOCONAZOLE 2 % EX CREA: TOPICAL_CREAM | CUTANEOUS | Status: DC

## 2015-07-14 MED ORDER — TRAZODONE HCL 100 MG PO TABS: 100.0000 mg | ORAL_TABLET | Freq: Every day | ORAL | Status: DC

## 2015-07-14 MED ORDER — CALCITRIOL 0.25 MCG PO CAPS: 0.2500 ug | ORAL_CAPSULE | Freq: Every day | ORAL | Status: DC

## 2015-07-14 MED ORDER — LEVOTHYROXINE SODIUM 150 MCG PO TABS: 150.0000 ug | ORAL_TABLET | Freq: Every day | ORAL | Status: DC

## 2015-07-14 NOTE — Assessment & Plan Note (Signed)
Chronic Norco, Fentanyl  Potential benefits of a long term opioids use as well as potential risks (i.e. addiction risk, apnea etc) and complications (i.e. Somnolence, constipation and others) were explained to the patient and were aknowledged. 

## 2015-07-14 NOTE — Progress Notes (Signed)
Pre visit review using our clinic review tool, if applicable. No additional management support is needed unless otherwise documented below in the visit note. 

## 2015-07-14 NOTE — Assessment & Plan Note (Signed)
W/c Motorized w/c at home [employer]

## 2015-07-14 NOTE — Assessment & Plan Note (Signed)
Chronic  Trazodone at hs Xanax prn  Potential benefits of a long term benzodiazepines  use as well as potential risks  and complications were explained to the patient and were aknowledged.

## 2015-07-14 NOTE — Assessment & Plan Note (Addendum)
Trazodone at hs. No change in dose

## 2015-07-14 NOTE — Patient Instructions (Signed)
Preventive Care for Adults, Female A healthy lifestyle and preventive care can promote health and wellness. Preventive health guidelines for women include the following key practices.  A routine yearly physical is a good way to check with your health care provider about your health and preventive screening. It is a chance to share any concerns and updates on your health and to receive a thorough exam.  Visit your dentist for a routine exam and preventive care every 6 months. Brush your teeth twice a day and floss once a day. Good oral hygiene prevents tooth decay and gum disease.  The frequency of eye exams is based on your age, health, family medical history, use of contact lenses, and other factors. Follow your health care provider's recommendations for frequency of eye exams.  Eat a healthy diet. Foods like vegetables, fruits, whole grains, low-fat dairy products, and lean protein foods contain the nutrients you need without too many calories. Decrease your intake of foods high in solid fats, added sugars, and salt. Eat the right amount of calories for you.Get information about a proper diet from your health care provider, if necessary.  Regular physical exercise is one of the most important things you can do for your health. Most adults should get at least 150 minutes of moderate-intensity exercise (any activity that increases your heart rate and causes you to sweat) each week. In addition, most adults need muscle-strengthening exercises on 2 or more days a week.  Maintain a healthy weight. The body mass index (BMI) is a screening tool to identify possible weight problems. It provides an estimate of body fat based on height and weight. Your health care provider can find your BMI and can help you achieve or maintain a healthy weight.For adults 20 years and older:  A BMI below 18.5 is considered underweight.  A BMI of 18.5 to 24.9 is normal.  A BMI of 25 to 29.9 is considered overweight.  A  BMI of 30 and above is considered obese.  Maintain normal blood lipids and cholesterol levels by exercising and minimizing your intake of saturated fat. Eat a balanced diet with plenty of fruit and vegetables. Blood tests for lipids and cholesterol should begin at age 45 and be repeated every 5 years. If your lipid or cholesterol levels are high, you are over 50, or you are at high risk for heart disease, you may need your cholesterol levels checked more frequently.Ongoing high lipid and cholesterol levels should be treated with medicines if diet and exercise are not working.  If you smoke, find out from your health care provider how to quit. If you do not use tobacco, do not start.  Lung cancer screening is recommended for adults aged 45-80 years who are at high risk for developing lung cancer because of a history of smoking. A yearly low-dose CT scan of the lungs is recommended for people who have at least a 30-pack-year history of smoking and are a current smoker or have quit within the past 15 years. A pack year of smoking is smoking an average of 1 pack of cigarettes a day for 1 year (for example: 1 pack a day for 30 years or 2 packs a day for 15 years). Yearly screening should continue until the smoker has stopped smoking for at least 15 years. Yearly screening should be stopped for people who develop a health problem that would prevent them from having lung cancer treatment.  If you are pregnant, do not drink alcohol. If you are  breastfeeding, be very cautious about drinking alcohol. If you are not pregnant and choose to drink alcohol, do not have more than 1 drink per day. One drink is considered to be 12 ounces (355 mL) of beer, 5 ounces (148 mL) of wine, or 1.5 ounces (44 mL) of liquor.  Avoid use of street drugs. Do not share needles with anyone. Ask for help if you need support or instructions about stopping the use of drugs.  High blood pressure causes heart disease and increases the risk  of stroke. Your blood pressure should be checked at least every 1 to 2 years. Ongoing high blood pressure should be treated with medicines if weight loss and exercise do not work.  If you are 55-79 years old, ask your health care provider if you should take aspirin to prevent strokes.  Diabetes screening is done by taking a blood sample to check your blood glucose level after you have not eaten for a certain period of time (fasting). If you are not overweight and you do not have risk factors for diabetes, you should be screened once every 3 years starting at age 45. If you are overweight or obese and you are 40-70 years of age, you should be screened for diabetes every year as part of your cardiovascular risk assessment.  Breast cancer screening is essential preventive care for women. You should practice "breast self-awareness." This means understanding the normal appearance and feel of your breasts and may include breast self-examination. Any changes detected, no matter how small, should be reported to a health care provider. Women in their 20s and 30s should have a clinical breast exam (CBE) by a health care provider as part of a regular health exam every 1 to 3 years. After age 40, women should have a CBE every year. Starting at age 40, women should consider having a mammogram (breast X-ray test) every year. Women who have a family history of breast cancer should talk to their health care provider about genetic screening. Women at a high risk of breast cancer should talk to their health care providers about having an MRI and a mammogram every year.  Breast cancer gene (BRCA)-related cancer risk assessment is recommended for women who have family members with BRCA-related cancers. BRCA-related cancers include breast, ovarian, tubal, and peritoneal cancers. Having family members with these cancers may be associated with an increased risk for harmful changes (mutations) in the breast cancer genes BRCA1 and  BRCA2. Results of the assessment will determine the need for genetic counseling and BRCA1 and BRCA2 testing.  Your health care provider may recommend that you be screened regularly for cancer of the pelvic organs (ovaries, uterus, and vagina). This screening involves a pelvic examination, including checking for microscopic changes to the surface of your cervix (Pap test). You may be encouraged to have this screening done every 3 years, beginning at age 21.  For women ages 30-65, health care providers may recommend pelvic exams and Pap testing every 3 years, or they may recommend the Pap and pelvic exam, combined with testing for human papilloma virus (HPV), every 5 years. Some types of HPV increase your risk of cervical cancer. Testing for HPV may also be done on women of any age with unclear Pap test results.  Other health care providers may not recommend any screening for nonpregnant women who are considered low risk for pelvic cancer and who do not have symptoms. Ask your health care provider if a screening pelvic exam is right for   you.  If you have had past treatment for cervical cancer or a condition that could lead to cancer, you need Pap tests and screening for cancer for at least 20 years after your treatment. If Pap tests have been discontinued, your risk factors (such as having a new sexual partner) need to be reassessed to determine if screening should resume. Some women have medical problems that increase the chance of getting cervical cancer. In these cases, your health care provider may recommend more frequent screening and Pap tests.  Colorectal cancer can be detected and often prevented. Most routine colorectal cancer screening begins at the age of 50 years and continues through age 75 years. However, your health care provider may recommend screening at an earlier age if you have risk factors for colon cancer. On a yearly basis, your health care provider may provide home test kits to check  for hidden blood in the stool. Use of a small camera at the end of a tube, to directly examine the colon (sigmoidoscopy or colonoscopy), can detect the earliest forms of colorectal cancer. Talk to your health care provider about this at age 50, when routine screening begins. Direct exam of the colon should be repeated every 5-10 years through age 75 years, unless early forms of precancerous polyps or small growths are found.  People who are at an increased risk for hepatitis B should be screened for this virus. You are considered at high risk for hepatitis B if:  You were born in a country where hepatitis B occurs often. Talk with your health care provider about which countries are considered high risk.  Your parents were born in a high-risk country and you have not received a shot to protect against hepatitis B (hepatitis B vaccine).  You have HIV or AIDS.  You use needles to inject street drugs.  You live with, or have sex with, someone who has hepatitis B.  You get hemodialysis treatment.  You take certain medicines for conditions like cancer, organ transplantation, and autoimmune conditions.  Hepatitis C blood testing is recommended for all people born from 1945 through 1965 and any individual with known risks for hepatitis C.  Practice safe sex. Use condoms and avoid high-risk sexual practices to reduce the spread of sexually transmitted infections (STIs). STIs include gonorrhea, chlamydia, syphilis, trichomonas, herpes, HPV, and human immunodeficiency virus (HIV). Herpes, HIV, and HPV are viral illnesses that have no cure. They can result in disability, cancer, and death.  You should be screened for sexually transmitted illnesses (STIs) including gonorrhea and chlamydia if:  You are sexually active and are younger than 24 years.  You are older than 24 years and your health care provider tells you that you are at risk for this type of infection.  Your sexual activity has changed  since you were last screened and you are at an increased risk for chlamydia or gonorrhea. Ask your health care provider if you are at risk.  If you are at risk of being infected with HIV, it is recommended that you take a prescription medicine daily to prevent HIV infection. This is called preexposure prophylaxis (PrEP). You are considered at risk if:  You are sexually active and do not regularly use condoms or know the HIV status of your partner(s).  You take drugs by injection.  You are sexually active with a partner who has HIV.  Talk with your health care provider about whether you are at high risk of being infected with HIV. If   you choose to begin PrEP, you should first be tested for HIV. You should then be tested every 3 months for as long as you are taking PrEP.  Osteoporosis is a disease in which the bones lose minerals and strength with aging. This can result in serious bone fractures or breaks. The risk of osteoporosis can be identified using a bone density scan. Women ages 67 years and over and women at risk for fractures or osteoporosis should discuss screening with their health care providers. Ask your health care provider whether you should take a calcium supplement or vitamin D to reduce the rate of osteoporosis.  Menopause can be associated with physical symptoms and risks. Hormone replacement therapy is available to decrease symptoms and risks. You should talk to your health care provider about whether hormone replacement therapy is right for you.  Use sunscreen. Apply sunscreen liberally and repeatedly throughout the day. You should seek shade when your shadow is shorter than you. Protect yourself by wearing long sleeves, pants, a wide-brimmed hat, and sunglasses year round, whenever you are outdoors.  Once a month, do a whole body skin exam, using a mirror to look at the skin on your back. Tell your health care provider of new moles, moles that have irregular borders, moles that  are larger than a pencil eraser, or moles that have changed in shape or color.  Stay current with required vaccines (immunizations).  Influenza vaccine. All adults should be immunized every year.  Tetanus, diphtheria, and acellular pertussis (Td, Tdap) vaccine. Pregnant women should receive 1 dose of Tdap vaccine during each pregnancy. The dose should be obtained regardless of the length of time since the last dose. Immunization is preferred during the 27th-36th week of gestation. An adult who has not previously received Tdap or who does not know her vaccine status should receive 1 dose of Tdap. This initial dose should be followed by tetanus and diphtheria toxoids (Td) booster doses every 10 years. Adults with an unknown or incomplete history of completing a 3-dose immunization series with Td-containing vaccines should begin or complete a primary immunization series including a Tdap dose. Adults should receive a Td booster every 10 years.  Varicella vaccine. An adult without evidence of immunity to varicella should receive 2 doses or a second dose if she has previously received 1 dose. Pregnant females who do not have evidence of immunity should receive the first dose after pregnancy. This first dose should be obtained before leaving the health care facility. The second dose should be obtained 4-8 weeks after the first dose.  Human papillomavirus (HPV) vaccine. Females aged 13-26 years who have not received the vaccine previously should obtain the 3-dose series. The vaccine is not recommended for use in pregnant females. However, pregnancy testing is not needed before receiving a dose. If a female is found to be pregnant after receiving a dose, no treatment is needed. In that case, the remaining doses should be delayed until after the pregnancy. Immunization is recommended for any person with an immunocompromised condition through the age of 61 years if she did not get any or all doses earlier. During the  3-dose series, the second dose should be obtained 4-8 weeks after the first dose. The third dose should be obtained 24 weeks after the first dose and 16 weeks after the second dose.  Zoster vaccine. One dose is recommended for adults aged 30 years or older unless certain conditions are present.  Measles, mumps, and rubella (MMR) vaccine. Adults born  before 1957 generally are considered immune to measles and mumps. Adults born in 1957 or later should have 1 or more doses of MMR vaccine unless there is a contraindication to the vaccine or there is laboratory evidence of immunity to each of the three diseases. A routine second dose of MMR vaccine should be obtained at least 28 days after the first dose for students attending postsecondary schools, health care workers, or international travelers. People who received inactivated measles vaccine or an unknown type of measles vaccine during 1963-1967 should receive 2 doses of MMR vaccine. People who received inactivated mumps vaccine or an unknown type of mumps vaccine before 1979 and are at high risk for mumps infection should consider immunization with 2 doses of MMR vaccine. For females of childbearing age, rubella immunity should be determined. If there is no evidence of immunity, females who are not pregnant should be vaccinated. If there is no evidence of immunity, females who are pregnant should delay immunization until after pregnancy. Unvaccinated health care workers born before 1957 who lack laboratory evidence of measles, mumps, or rubella immunity or laboratory confirmation of disease should consider measles and mumps immunization with 2 doses of MMR vaccine or rubella immunization with 1 dose of MMR vaccine.  Pneumococcal 13-valent conjugate (PCV13) vaccine. When indicated, a person who is uncertain of his immunization history and has no record of immunization should receive the PCV13 vaccine. All adults 65 years of age and older should receive this  vaccine. An adult aged 19 years or older who has certain medical conditions and has not been previously immunized should receive 1 dose of PCV13 vaccine. This PCV13 should be followed with a dose of pneumococcal polysaccharide (PPSV23) vaccine. Adults who are at high risk for pneumococcal disease should obtain the PPSV23 vaccine at least 8 weeks after the dose of PCV13 vaccine. Adults older than 62 years of age who have normal immune system function should obtain the PPSV23 vaccine dose at least 1 year after the dose of PCV13 vaccine.  Pneumococcal polysaccharide (PPSV23) vaccine. When PCV13 is also indicated, PCV13 should be obtained first. All adults aged 65 years and older should be immunized. An adult younger than age 65 years who has certain medical conditions should be immunized. Any person who resides in a nursing home or long-term care facility should be immunized. An adult smoker should be immunized. People with an immunocompromised condition and certain other conditions should receive both PCV13 and PPSV23 vaccines. People with human immunodeficiency virus (HIV) infection should be immunized as soon as possible after diagnosis. Immunization during chemotherapy or radiation therapy should be avoided. Routine use of PPSV23 vaccine is not recommended for American Indians, [place] Natives, or people younger than 65 years unless there are medical conditions that require PPSV23 vaccine. When indicated, people who have unknown immunization and have no record of immunization should receive PPSV23 vaccine. One-time revaccination 5 years after the first dose of PPSV23 is recommended for people aged 19-64 years who have chronic kidney failure, nephrotic syndrome, asplenia, or immunocompromised conditions. People who received 1-2 doses of PPSV23 before age 65 years should receive another dose of PPSV23 vaccine at age 65 years or later if at least 5 years have passed since the previous dose. Doses of PPSV23 are not  needed for people immunized with PPSV23 at or after age 65 years.  Meningococcal vaccine. Adults with asplenia or persistent complement component deficiencies should receive 2 doses of quadrivalent meningococcal conjugate (MenACWY-D) vaccine. The doses should be obtained   at least 2 months apart. Microbiologists working with certain meningococcal bacteria, [place] recruits, people at risk during an outbreak, and people who travel to or live in countries with a high rate of meningitis should be immunized. A first-year college student up through age 34 years who is living in a residence hall should receive a dose if she did not receive a dose on or after her 16th birthday. Adults who have certain high-risk conditions should receive one or more doses of vaccine.  Hepatitis A vaccine. Adults who wish to be protected from this disease, have certain high-risk conditions, work with hepatitis A-infected animals, work in hepatitis A research labs, or travel to or work in countries with a high rate of hepatitis A should be immunized. Adults who were previously unvaccinated and who anticipate close contact with an international adoptee during the first 60 days after arrival in the [place] from a country with a high rate of hepatitis A should be immunized.  Hepatitis B vaccine. Adults who wish to be protected from this disease, have certain high-risk conditions, may be exposed to blood or other infectious body fluids, are household contacts or sex partners of hepatitis B positive people, are clients or workers in certain care facilities, or travel to or work in countries with a high rate of hepatitis B should be immunized.  Haemophilus influenzae type b (Hib) vaccine. A previously unvaccinated person with asplenia or sickle cell disease or having a scheduled splenectomy should receive 1 dose of Hib vaccine. Regardless of previous immunization, a recipient of a hematopoietic stem cell transplant should receive a  3-dose series 6-12 months after her successful transplant. Hib vaccine is not recommended for adults with HIV infection. Preventive Services / Frequency Ages 35 to 4 years  Blood pressure check.** / Every 3-5 years.  Lipid and cholesterol check.** / Every 5 years beginning at age 60.  Clinical breast exam.** / Every 3 years for women in their 71s and 10s.  BRCA-related cancer risk assessment.** / For women who have family members with a BRCA-related cancer (breast, ovarian, tubal, or peritoneal cancers).  Pap test.** / Every 2 years from ages 76 through 26. Every 3 years starting at age 61 through age 76 or 93 with a history of 3 consecutive normal Pap tests.  HPV screening.** / Every 3 years from ages 37 through ages 60 to 51 with a history of 3 consecutive normal Pap tests.  Hepatitis C blood test.** / For any individual with known risks for hepatitis C.  Skin self-exam. / Monthly.  Influenza vaccine. / Every year.  Tetanus, diphtheria, and acellular pertussis (Tdap, Td) vaccine.** / Consult your health care provider. Pregnant women should receive 1 dose of Tdap vaccine during each pregnancy. 1 dose of Td every 10 years.  Varicella vaccine.** / Consult your health care provider. Pregnant females who do not have evidence of immunity should receive the first dose after pregnancy.  HPV vaccine. / 3 doses over 6 months, if 93 and younger. The vaccine is not recommended for use in pregnant females. However, pregnancy testing is not needed before receiving a dose.  Measles, mumps, rubella (MMR) vaccine.** / You need at least 1 dose of MMR if you were born in 1957 or later. You may also need a 2nd dose. For females of childbearing age, rubella immunity should be determined. If there is no evidence of immunity, females who are not pregnant should be vaccinated. If there is no evidence of immunity, females who are  pregnant should delay immunization until after pregnancy.  Pneumococcal  13-valent conjugate (PCV13) vaccine.** / Consult your health care provider.  Pneumococcal polysaccharide (PPSV23) vaccine.** / 1 to 2 doses if you smoke cigarettes or if you have certain conditions.  Meningococcal vaccine.** / 1 dose if you are age 68 to 8 years and a Market researcher living in a residence hall, or have one of several medical conditions, you need to get vaccinated against meningococcal disease. You may also need additional booster doses.  Hepatitis A vaccine.** / Consult your health care provider.  Hepatitis B vaccine.** / Consult your health care provider.  Haemophilus influenzae type b (Hib) vaccine.** / Consult your health care provider. Ages 7 to 53 years  Blood pressure check.** / Every year.  Lipid and cholesterol check.** / Every 5 years beginning at age 25 years.  Lung cancer screening. / Every year if you are aged 11-80 years and have a 30-pack-year history of smoking and currently smoke or have quit within the past 15 years. Yearly screening is stopped once you have quit smoking for at least 15 years or develop a health problem that would prevent you from having lung cancer treatment.  Clinical breast exam.** / Every year after age 48 years.  BRCA-related cancer risk assessment.** / For women who have family members with a BRCA-related cancer (breast, ovarian, tubal, or peritoneal cancers).  Mammogram.** / Every year beginning at age 41 years and continuing for as long as you are in good health. Consult with your health care provider.  Pap test.** / Every 3 years starting at age 65 years through age 37 or 70 years with a history of 3 consecutive normal Pap tests.  HPV screening.** / Every 3 years from ages 72 years through ages 60 to 40 years with a history of 3 consecutive normal Pap tests.  Fecal occult blood test (FOBT) of stool. / Every year beginning at age 21 years and continuing until age 5 years. You may not need to do this test if you get  a colonoscopy every 10 years.  Flexible sigmoidoscopy or colonoscopy.** / Every 5 years for a flexible sigmoidoscopy or every 10 years for a colonoscopy beginning at age 35 years and continuing until age 48 years.  Hepatitis C blood test.** / For all people born from [birth year] through 1965 and any individual with known risks for hepatitis C.  Skin self-exam. / Monthly.  Influenza vaccine. / Every year.  Tetanus, diphtheria, and acellular pertussis (Tdap/Td) vaccine.** / Consult your health care provider. Pregnant women should receive 1 dose of Tdap vaccine during each pregnancy. 1 dose of Td every 10 years.  Varicella vaccine.** / Consult your health care provider. Pregnant females who do not have evidence of immunity should receive the first dose after pregnancy.  Zoster vaccine.** / 1 dose for adults aged 30 years or older.  Measles, mumps, rubella (MMR) vaccine.** / You need at least 1 dose of MMR if you were born in 1957 or later. You may also need a second dose. For females of childbearing age, rubella immunity should be determined. If there is no evidence of immunity, females who are not pregnant should be vaccinated. If there is no evidence of immunity, females who are pregnant should delay immunization until after pregnancy.  Pneumococcal 13-valent conjugate (PCV13) vaccine.** / Consult your health care provider.  Pneumococcal polysaccharide (PPSV23) vaccine.** / 1 to 2 doses if you smoke cigarettes or if you have certain conditions.  Meningococcal vaccine.** /  Consult your health care provider.  Hepatitis A vaccine.** / Consult your health care provider.  Hepatitis B vaccine.** / Consult your health care provider.  Haemophilus influenzae type b (Hib) vaccine.** / Consult your health care provider. Ages 64 years and over  Blood pressure check.** / Every year.  Lipid and cholesterol check.** / Every 5 years beginning at age 23 years.  Lung cancer screening. / Every year if you  are aged 16-80 years and have a 30-pack-year history of smoking and currently smoke or have quit within the past 15 years. Yearly screening is stopped once you have quit smoking for at least 15 years or develop a health problem that would prevent you from having lung cancer treatment.  Clinical breast exam.** / Every year after age 74 years.  BRCA-related cancer risk assessment.** / For women who have family members with a BRCA-related cancer (breast, ovarian, tubal, or peritoneal cancers).  Mammogram.** / Every year beginning at age 44 years and continuing for as long as you are in good health. Consult with your health care provider.  Pap test.** / Every 3 years starting at age 58 years through age 22 or 39 years with 3 consecutive normal Pap tests. Testing can be stopped between 65 and 70 years with 3 consecutive normal Pap tests and no abnormal Pap or HPV tests in the past 10 years.  HPV screening.** / Every 3 years from ages 64 years through ages 70 or 61 years with a history of 3 consecutive normal Pap tests. Testing can be stopped between 65 and 70 years with 3 consecutive normal Pap tests and no abnormal Pap or HPV tests in the past 10 years.  Fecal occult blood test (FOBT) of stool. / Every year beginning at age 40 years and continuing until age 27 years. You may not need to do this test if you get a colonoscopy every 10 years.  Flexible sigmoidoscopy or colonoscopy.** / Every 5 years for a flexible sigmoidoscopy or every 10 years for a colonoscopy beginning at age 7 years and continuing until age 32 years.  Hepatitis C blood test.** / For all people born from [birth year] through 1967 and any individual with known risks for hepatitis C.  Osteoporosis screening.** / A one-time screening for women ages 30 years and over and women at risk for fractures or osteoporosis.  Skin self-exam. / Monthly.  Influenza vaccine. / Every year.  Tetanus, diphtheria, and acellular pertussis (Tdap/Td)  vaccine.** / 1 dose of Td every 10 years.  Varicella vaccine.** / Consult your health care provider.  Zoster vaccine.** / 1 dose for adults aged 35 years or older.  Pneumococcal 13-valent conjugate (PCV13) vaccine.** / Consult your health care provider.  Pneumococcal polysaccharide (PPSV23) vaccine.** / 1 dose for all adults aged 46 years and older.  Meningococcal vaccine.** / Consult your health care provider.  Hepatitis A vaccine.** / Consult your health care provider.  Hepatitis B vaccine.** / Consult your health care provider.  Haemophilus influenzae type b (Hib) vaccine.** / Consult your health care provider. ** Family history and personal history of risk and conditions may change your health care provider's recommendations.   This information is not intended to replace advice given to you by your health care provider. Make sure you discuss any questions you have with your health care provider.   Document Released: 08/08/2001 Document Revised: 07/03/2014 Document Reviewed: 11/07/2010 Elsevier Interactive Patient Education [employer].

## 2015-07-14 NOTE — Progress Notes (Signed)
Subjective:  Patient ID: Kaylee Hansen, female    DOB: 1956/06/26  Age: 60 y.o. MRN: [id number]  CC: No chief complaint on file.   HPI [name] presents for a well exam F/u LBP, chronic pain, anxiety, seizure disorder. C/o weakness  Outpatient Prescriptions Prior to Visit  Medication Sig Dispense Refill  . ALPRAZolam (XANAX) 0.25 MG tablet Take 1 tablet (0.25 mg total) by mouth 3 (three) times daily as needed for sleep or anxiety. Take one tablet by mouth 2-3 times daily as needed for anxiety   (3 months supply) (Patient taking differently: Take 0.25 mg by mouth at bedtime. ) 270 tablet 1  . cycloSPORINE (RESTASIS) 0.05 % ophthalmic emulsion Place 1 drop into both eyes 2 (two) times daily. 0.4 mL 0  . DiphenhydrAMINE HCl, Sleep, (ZZZQUIL PO) Take 1 tablet by mouth at bedtime.    . fluconazole (DIFLUCAN) 150 MG tablet Take 1 tablet (150 mg total) by mouth once. 1 tablet 2  . gabapentin (NEURONTIN) 100 MG capsule TAKE 1 CAPSULE (100 MG TOTAL) BY MOUTH 3 (THREE) TIMES DAILY. 3 MONTHS SUPPLY (Patient taking differently: Take 100 mg by mouth 3 (three) times daily. TAKE 1 CAPSULE (100 MG TOTAL) BY MOUTH 3 (THREE) TIMES DAILY. 3 MONTHS SUPPLY) 270 capsule 3  . ketoconazole (NIZORAL) 2 % cream APPLY TOPICALLY DAILY. 45 g 1  . naproxen sodium (ANAPROX) 220 MG tablet Take 220 mg by mouth daily as needed (headaches).    . NON FORMULARY Oxygen- use at night as directed     . pantoprazole (PROTONIX) 40 MG tablet Take 1 tablet (40 mg total) by mouth daily. 90 tablet 3  . phenytoin (DILANTIN) 100 MG ER capsule 100 mg every morning and 200 mg every night. 270 capsule 3  . promethazine (PHENERGAN) 25 MG tablet Take 1 tablet (25 mg total) by mouth daily. 90 tablet 1  . SUMAtriptan (IMITREX) 100 MG tablet Take 1 tablet (100 mg total) by mouth every 2 (two) hours as needed for migraine or headache. May repeat in 2 hours if headache persists or recurs. 10 tablet 3  . calcitRIOL (ROCALTROL) 0.25 MCG  capsule Take 1 capsule (0.25 mcg total) by mouth daily. 90 capsule 3  . fentaNYL (DURAGESIC - DOSED MCG/HR) 12 MCG/HR Place 1 patch (12.5 mcg total) onto the skin every 3 (three) days. Please fill on or after 07/21/15 10 patch 0  . HYDROcodone-acetaminophen (NORCO) 10-325 MG tablet Take 1 tablet by mouth every 6 (six) hours as needed for moderate pain or severe pain. Please fill on or after 07/21/15 120 tablet 0  . levothyroxine (SYNTHROID, LEVOTHROID) 150 MCG tablet Take 1 tablet (150 mcg total) by mouth daily. 90 tablet 3  . Naloxegol Oxalate (MOVANTIK) 25 MG TABS Take 1 tablet by mouth daily. 30 tablet 11  . PARoxetine (PAXIL) 40 MG tablet TAKE 1 TABLET (40 MG TOTAL) BY MOUTH DAILY. 90 tablet 3  . polyethylene glycol (MIRALAX / GLYCOLAX) packet Take 17 g by mouth daily as needed for mild constipation.    . traZODone (DESYREL) 100 MG tablet Take 1 tablet (100 mg total) by mouth at bedtime. 90 tablet 3  . albuterol (PROVENTIL HFA;VENTOLIN HFA) 108 (90 BASE) MCG/ACT inhaler Inhale 2 puffs into the lungs every 6 (six) hours as needed for wheezing. 1 Inhaler 5  . ciprofloxacin (CIPRO) 500 MG tablet Take 1 tablet (500 mg total) by mouth 2 (two) times daily. (Patient not taking: Reported on 07/14/2015) 20 tablet 0  No facility-administered medications prior to visit.    ROS Review of Systems  Constitutional: Positive for fatigue. Negative for chills, activity change, appetite change and unexpected weight change.  HENT: Negative for congestion, mouth sores and sinus pressure.   Eyes: Negative for visual disturbance.  Respiratory: Negative for cough and chest tightness.   Gastrointestinal: Negative for nausea and abdominal pain.  Genitourinary: Negative for frequency, difficulty urinating and vaginal pain.  Musculoskeletal: Positive for back pain, arthralgias and gait problem.  Skin: Negative for pallor and rash.  Neurological: Positive for weakness. Negative for dizziness, tremors, numbness and  headaches.  Psychiatric/Behavioral: Positive for decreased concentration. Negative for suicidal ideas, confusion, sleep disturbance and agitation.    Objective:  BP 90/42 mmHg  Pulse 83  Ht '5\' 5"'$  (1.651 m)  Wt 169 lb (76.658 kg)  BMI 28.12 kg/m2  SpO2 91%  BP Readings from Last 3 Encounters:  07/14/15 90/42  06/14/15 155/96  04/08/15 100/50    Wt Readings from Last 3 Encounters:  07/14/15 169 lb (76.658 kg)  06/14/15 172 lb 6.4 oz (78.2 kg)  04/08/15 170 lb (77.111 kg)    Physical Exam  Constitutional: She appears well-developed. No distress.  HENT:  Head: Normocephalic.  Right Ear: External ear normal.  Left Ear: External ear normal.  Nose: Nose normal.  Mouth/Throat: Oropharynx is clear and moist.  Eyes: Conjunctivae are normal. Pupils are equal, round, and reactive to light. Right eye exhibits no discharge. Left eye exhibits no discharge.  Neck: Normal range of motion. Neck supple. No JVD present. No tracheal deviation present. No thyromegaly present.  Cardiovascular: Normal rate, regular rhythm and normal heart sounds.   Pulmonary/Chest: No stridor. No respiratory distress. She has no wheezes.  Abdominal: Soft. Bowel sounds are normal. She exhibits no distension and no mass. There is no tenderness. There is no rebound and no guarding.  Musculoskeletal: She exhibits tenderness. She exhibits no edema.  Lymphadenopathy:    She has no cervical adenopathy.  Neurological: She displays normal reflexes. No cranial nerve deficit. She exhibits normal muscle tone. Coordination abnormal.  Skin: No rash noted. No erythema.  Psychiatric: Thought content normal.  in a w/c Flat affect  Lab Results  Component Value Date   WBC 7.8 07/14/2015   HGB 12.0 07/14/2015   HCT 36.1 07/14/2015   PLT 278.0 07/14/2015   GLUCOSE 82 07/14/2015   CHOL 310* 07/14/2015   TRIG 300.0* 07/14/2015   HDL 38.00* 07/14/2015   LDLDIRECT 187.0 07/14/2015   LDLCALC * 05/12/2009    240        Total  Cholesterol/HDL:CHD Risk Coronary Heart Disease Risk Table                     Men   Women  1/2 Average Risk   3.4   3.3  Average Risk       5.0   4.4  2 X Average Risk   9.6   7.1  3 X Average Risk  23.4   11.0        Use the calculated Patient Ratio above and the CHD Risk Table to determine the patient's CHD Risk.        ATP III CLASSIFICATION (LDL):  <100     mg/dL   Optimal  100-129  mg/dL   Near or Above                    Optimal  130-159  mg/dL  Borderline  160-189  mg/dL   High  >190     mg/dL   Very High   ALT 6 07/14/2015   AST 9 07/14/2015   NA 141 07/14/2015   K 4.3 07/14/2015   CL 103 07/14/2015   CREATININE 0.89 07/14/2015   BUN 10 07/14/2015   CO2 33* 07/14/2015   TSH 0.46 07/14/2015   INR 1.0 09/16/2008   HGBA1C  09/17/2008    5.0 (NOTE)   The ADA recommends the following therapeutic goal for glycemic   control related to Hgb A1C measurement:   Goal of Therapy:   < 7.0% Hgb A1C   Reference: American Diabetes Association: Clinical Practice   Recommendations 2008, Diabetes Care,  2008, 31:(Suppl 1).    Dg Ribs Unilateral W/chest Right  06/14/2015  CLINICAL DATA:  Pain following fall EXAM: RIGHT RIBS AND CHEST - 3+ VIEW COMPARISON:  Chest radiograph July 09, 2014 FINDINGS: Frontal chest as well as oblique and cone-down rib images obtained. There is no edema or consolidation. Heart size and pulmonary vascularity are normal. No adenopathy. No pneumothorax or effusion. There are old healed rib fractures on the left involving the posterior eighth and lateral seventh ribs. On the right, there are old fractures of the fifth, sixth, seventh, and eighth anterior ribs, as well as more posterior ninth and tenth rib fractures on the right. No acute rib fracture is evident on this study. IMPRESSION: Multiple old healed rib fractures bilaterally. No acute fracture evident. No edema or consolidation. No pneumothorax. Electronically Signed   By: Lowella Grip III M.D.   On:  06/14/2015 23:04   Dg Hip Unilat  With Pelvis 2-3 Views Right  06/14/2015  CLINICAL DATA:  60 year old female with fall and right hip pain. 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 No acute fracture or dislocation. 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 The soft tissues are grossly unremarkable. IMPRESSION: No acute fracture or dislocation. Electronically Signed   By: Anner Crete M.D.   On: 06/14/2015 21:32    Assessment & Plan:   Diagnoses and all orders for this visit:  Well adult exam -     Basic metabolic panel; Future -     CBC with Differential/Platelet; Future -     Hepatic function panel; Future -     Lipid panel; Future -     TSH; Future -     VITAMIN D 25 Hydroxy (Vit-D Deficiency, Fractures); Future -     Urinalysis; Future  Essential hypertension, benign -     Cortisol; Future  Gastroesophageal reflux disease without esophagitis -     Cortisol; Future  Mineralocorticoid deficiency (HCC) -     Cortisol; Future -     Basic metabolic panel; Future -     CBC with Differential/Platelet; Future -     Hepatic function panel; Future -     Lipid panel; Future -     TSH; Future -     VITAMIN D 25 Hydroxy (Vit-D Deficiency, Fractures); Future -     Urinalysis; Future  Major depressive disorder, single episode, moderate (HCC) -     Cortisol; Future -     Urinalysis; Future  Generalized anxiety disorder -     Cortisol; Future -     Urinalysis; Future  Low back pain with sciatica, sciatica laterality unspecified, unspecified back pain laterality, unspecified chronicity  Need for Tdap  vaccination -     Tdap vaccine greater than or equal to 7yo IM  Need for prophylactic vaccination against Streptococcus pneumoniae (pneumococcus) -     Pneumococcal polysaccharide vaccine 23-valent greater than or equal to 2yo subcutaneous/IM  Other orders -     Discontinue: albuterol  (PROVENTIL) (2.5 MG/3ML) 0.083% nebulizer solution; Take 3 mLs (2.5 mg total) by nebulization 4 (four) times daily. -     levothyroxine (SYNTHROID, LEVOTHROID) 150 MCG tablet; Take 1 tablet (150 mcg total) by mouth daily. -     traZODone (DESYREL) 100 MG tablet; Take 1 tablet (100 mg total) by mouth at bedtime. -     Discontinue: HYDROcodone-acetaminophen (NORCO) 10-325 MG tablet; Take 1 tablet by mouth every 6 (six) hours as needed for moderate pain or severe pain. Please fill on or after 08/21/15 -     Discontinue: fentaNYL (DURAGESIC - DOSED MCG/HR) 12 MCG/HR; Place 1 patch (12.5 mcg total) onto the skin every 3 (three) days. Please fill on or after 08/21/15 -     calcitRIOL (ROCALTROL) 0.25 MCG capsule; Take 1 capsule (0.25 mcg total) by mouth daily. -     PARoxetine (PAXIL) 40 MG tablet; TAKE 1 TABLET (40 MG TOTAL) BY MOUTH DAILY. -     polyethylene glycol (MIRALAX / GLYCOLAX) packet; Take 17 g by mouth daily as needed for mild constipation. -     ketoconazole (NIZORAL) 2 % cream; APPLY TOPICALLY DAILY. -     Discontinue: fentaNYL (DURAGESIC - DOSED MCG/HR) 12 MCG/HR; Place 1 patch (12.5 mcg total) onto the skin every 3 (three) days. Please fill on or after 09/18/15 -     Discontinue: HYDROcodone-acetaminophen (NORCO) 10-325 MG tablet; Take 1 tablet by mouth every 6 (six) hours as needed for moderate pain or severe pain. Please fill on or after 09/18/15 -     fentaNYL (DURAGESIC - DOSED MCG/HR) 12 MCG/HR; Place 1 patch (12.5 mcg total) onto the skin every 3 (three) days. Please fill on or after 10/19/15 -     HYDROcodone-acetaminophen (NORCO) 10-325 MG tablet; Take 1 tablet by mouth every 6 (six) hours as needed for moderate pain or severe pain. Please fill on or after 10/19/15 -     albuterol (PROVENTIL) (2.5 MG/3ML) 0.083% nebulizer solution; Take 3 mLs (2.5 mg total) by nebulization 4 (four) times daily.  I have discontinued Ms. Gilkison's naloxegol oxalate, fentaNYL, HYDROcodone-acetaminophen,  ciprofloxacin, HYDROcodone-acetaminophen, and fentaNYL. I have also changed her fentaNYL and HYDROcodone-acetaminophen. Additionally, I am having her maintain her NON FORMULARY, albuterol, cycloSPORINE, SUMAtriptan, pantoprazole, promethazine, ALPRAZolam, (DiphenhydrAMINE HCl, Sleep, (ZZZQUIL PO)), gabapentin, phenytoin, fluconazole, ketoconazole, naproxen sodium, levothyroxine, traZODone, calcitRIOL, PARoxetine, polyethylene glycol, ketoconazole, and albuterol.  Meds ordered this encounter  Medications  . DISCONTD: albuterol (PROVENTIL) (2.5 MG/3ML) 0.083% nebulizer solution    Sig: Take 3 mLs (2.5 mg total) by nebulization 4 (four) times daily.    Dispense:  120 vial    Refill:  11  . levothyroxine (SYNTHROID, LEVOTHROID) 150 MCG tablet    Sig: Take 1 tablet (150 mcg total) by mouth daily.    Dispense:  90 tablet    Refill:  3  . traZODone (DESYREL) 100 MG tablet    Sig: Take 1 tablet (100 mg total) by mouth at bedtime.    Dispense:  90 tablet    Refill:  3  . DISCONTD: HYDROcodone-acetaminophen (NORCO) 10-325 MG tablet    Sig: Take 1 tablet by mouth every 6 (six) hours as needed for moderate pain  or severe pain. Please fill on or after 08/21/15    Dispense:  120 tablet    Refill:  0  . DISCONTD: fentaNYL (DURAGESIC - DOSED MCG/HR) 12 MCG/HR    Sig: Place 1 patch (12.5 mcg total) onto the skin every 3 (three) days. Please fill on or after 08/21/15    Dispense:  10 patch    Refill:  0  . calcitRIOL (ROCALTROL) 0.25 MCG capsule    Sig: Take 1 capsule (0.25 mcg total) by mouth daily.    Dispense:  90 capsule    Refill:  3  . PARoxetine (PAXIL) 40 MG tablet    Sig: TAKE 1 TABLET (40 MG TOTAL) BY MOUTH DAILY.    Dispense:  90 tablet    Refill:  3  . polyethylene glycol (MIRALAX / GLYCOLAX) packet    Sig: Take 17 g by mouth daily as needed for mild constipation.    Dispense:  30 each    Refill:  11  . ketoconazole (NIZORAL) 2 % cream    Sig: APPLY TOPICALLY DAILY.    Dispense:  45 g      Refill:  1  . DISCONTD: fentaNYL (DURAGESIC - DOSED MCG/HR) 12 MCG/HR    Sig: Place 1 patch (12.5 mcg total) onto the skin every 3 (three) days. Please fill on or after 09/18/15    Dispense:  10 patch    Refill:  0  . DISCONTD: HYDROcodone-acetaminophen (NORCO) 10-325 MG tablet    Sig: Take 1 tablet by mouth every 6 (six) hours as needed for moderate pain or severe pain. Please fill on or after 09/18/15    Dispense:  120 tablet    Refill:  0  . fentaNYL (DURAGESIC - DOSED MCG/HR) 12 MCG/HR    Sig: Place 1 patch (12.5 mcg total) onto the skin every 3 (three) days. Please fill on or after 10/19/15    Dispense:  10 patch    Refill:  0  . HYDROcodone-acetaminophen (NORCO) 10-325 MG tablet    Sig: Take 1 tablet by mouth every 6 (six) hours as needed for moderate pain or severe pain. Please fill on or after 10/19/15    Dispense:  120 tablet    Refill:  0  . albuterol (PROVENTIL) (2.5 MG/3ML) 0.083% nebulizer solution    Sig: Take 3 mLs (2.5 mg total) by nebulization 4 (four) times daily.    Dispense:  120 vial    Refill:  11     Follow-up: Return in about 3 months (around 10/12/2015) for a follow-up visit.  Walker Kehr, MD

## 2015-07-14 NOTE — Assessment & Plan Note (Signed)
Protonix Rx 

## 2015-07-14 NOTE — Assessment & Plan Note (Signed)
NAS diet 

## 2015-07-14 NOTE — Assessment & Plan Note (Signed)
Not on Rx 

## 2015-07-14 NOTE — Assessment & Plan Note (Signed)
UA

## 2015-07-14 NOTE — Assessment & Plan Note (Signed)
Here for medicare wellness/physical  Diet: heart healthy  Physical activity: sedentary - w/c Depression/mood screen: negative  Hearing: intact to whispered voice  Visual acuity: grossly normal, performs annual eye exam  ADLs: capable w/assistance Fall risk: high Home safety: good  Cognitive evaluation: intact to orientation, naming, recall and repetition  EOL planning: adv directives, full code/ I agree  I have personally reviewed and have noted  1. The patient's medical, surgical and social history  2. Their use of alcohol, tobacco or illicit drugs  3. Their current medications and supplements  4. The patient's functional ability including ADL's, fall risks, home safety risks and hearing or visual impairment.  5. Diet and physical activities  6. Evidence for depression or mood disorders 7. The roster of all physicians providing medical care to patient - is listed in the Snapshot section of the chart and reviewed today.    Today patient counseled on age appropriate routine health concerns for screening and prevention, each reviewed and up to date or declined. Immunizations reviewed and up to date or declined. Labs ordered and reviewed. Risk factors for depression reviewed and negative. Hearing function and visual acuity are intact. ADLs screened and addressed as needed. Functional ability and level of safety reviewed and appropriate. Education, counseling and referrals performed based on assessed risks today. Patient provided with a copy of personalized plan for preventive services.

## 2015-07-15 MED ORDER — ERGOCALCIFEROL 1.25 MG (50000 UT) PO CAPS: 50000.0000 [IU] | ORAL_CAPSULE | ORAL | Status: DC

## 2015-08-16 ENCOUNTER — Telehealth: Payer: Self-pay | Admitting: *Deleted

## 2015-08-16 MED ORDER — SUMATRIPTAN SUCCINATE 100 MG PO TABS: 100.0000 mg | ORAL_TABLET | ORAL | Status: DC | PRN

## 2015-08-16 MED ORDER — CIPROFLOXACIN HCL 250 MG PO TABS: 250.0000 mg | ORAL_TABLET | Freq: Two times a day (BID) | ORAL | Status: DC

## 2015-08-16 MED ORDER — PANTOPRAZOLE SODIUM 40 MG PO TBEC: 40.0000 mg | DELAYED_RELEASE_TABLET | Freq: Every day | ORAL | Status: DC

## 2015-08-16 NOTE — Telephone Encounter (Signed)
Ok to ref meds Thx

## 2015-08-16 NOTE — Telephone Encounter (Signed)
Pt c/o dysuria. She is requesting Rx for Cipro. Please advise.   Pt also requesting refill on Sumatriptan 100 mg and Pantoprazole 40 mg. Refills sent. See meds.

## 2015-08-16 NOTE — Telephone Encounter (Signed)
Pt's husband informed.

## 2015-09-08 ENCOUNTER — Ambulatory Visit (INDEPENDENT_AMBULATORY_CARE_PROVIDER_SITE_OTHER)
Admission: RE | Admit: 2015-09-08 | Discharge: 2015-09-08 | Disposition: A | Discharge status: Home / Self Care | Payer: Medicare Other | Source: Ambulatory Visit | Attending: Nurse Practitioner | Admitting: Nurse Practitioner

## 2015-09-08 ENCOUNTER — Encounter: Payer: Self-pay | Admitting: Nurse Practitioner

## 2015-09-08 ENCOUNTER — Ambulatory Visit (INDEPENDENT_AMBULATORY_CARE_PROVIDER_SITE_OTHER): Payer: Medicare Other | Admitting: Nurse Practitioner

## 2015-09-08 VITALS — BP 140/40 | HR 82 | Temp 97.7°F | Ht 65.0 in | Wt 172.1 lb

## 2015-09-08 DIAGNOSIS — J441 Chronic obstructive pulmonary disease with (acute) exacerbation: Secondary | ICD-10-CM

## 2015-09-08 NOTE — Patient Instructions (Signed)
We will let you know results and next steps.

## 2015-09-08 NOTE — Progress Notes (Signed)
Patient ID: Kaylee Hansen, female    DOB: 12/15/1955  Age: 60 y.o. MRN: [id number]  CC: Cough and Wheezing   HPI Kaylee Hansen presents for cough and wheezing x "a few weeks".  1) Pt has had a persistent cough x 2-3 weeks with production- green Tenderness behind left breast and chest tightness Wheezing HA  Sweats- any time she reports   Treatment to date:  Nebulizer- yesterday Inhaler- yesterday  OTC [employer] chest congestion   Sick contacts: Denies  History Kaylee Hansen has a past medical history of cancer of lung (1999); History of cholecystectomy; appendectomy; hysterectomy; COPD (chronic obstructive pulmonary disease) ([place]); Low back pain; Osteoarthritis of knee; Seizures ([place]); Hypothyroidism; Anxiety; Depression; Stroke [facility]); Hyperlipidemia; Collagenous colitis; Esophageal ulcer; Esophageal ulcer (04/2009); Helicobacter pylori gastritis (2010); Constipation; Colon adenomas (2011); Diverticulosis; GERD (gastroesophageal reflux disease); Esophageal stricture (11/08/2012); and 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   She has past surgical history that includes Appendectomy; Abdominal hysterectomy; Cholecystectomy; Total hip arthroplasty; Abdominal surgery; Esophagogastroduodenoscopy; Colonoscopy w/ biopsies; Esophagogastroduodenoscopy (N/A, 11/08/2012); and Balloon dilation (N/A, 11/08/2012).   Her family history includes Coronary artery disease in her mother and other; Diabetes in her mother and son; Hypertension in her father; Kidney disease in her other. There is no history of Colon cancer.She reports that she has been smoking Cigarettes.  She has been smoking about 0.50 packs per day. She has never used smokeless tobacco. She reports that she does not drink alcohol or use illicit drugs.  Outpatient Prescriptions Prior to Visit  Medication Sig Dispense Refill  . albuterol (PROVENTIL) (2.5 MG/3ML) 0.083% nebulizer solution Take 3 mLs (2.5 mg total) by nebulization 4 (four) times daily. 120 vial 11  .  DiphenhydrAMINE HCl, Sleep, (ZZZQUIL PO) Take 1 tablet by mouth at bedtime.    . ergocalciferol (VITAMIN D2) 50000 units capsule Take 1 capsule (50,000 Units total) by mouth once a week. 12 capsule 3  . fentaNYL (DURAGESIC - DOSED MCG/HR) 12 MCG/HR Place 1 patch (12.5 mcg total) onto the skin every 3 (three) days. Please fill on or after 10/19/15 10 patch 0  . HYDROcodone-acetaminophen (NORCO) 10-325 MG tablet Take 1 tablet by mouth every 6 (six) hours as needed for moderate pain or severe pain. Please fill on or after 10/19/15 120 tablet 0  . levothyroxine (SYNTHROID, LEVOTHROID) 150 MCG tablet Take 1 tablet (150 mcg total) by mouth daily. 90 tablet 3  . NON FORMULARY Oxygen- use at night as directed     . pantoprazole (PROTONIX) 40 MG tablet Take 1 tablet (40 mg total) by mouth daily. 90 tablet 3  . PARoxetine (PAXIL) 40 MG tablet TAKE 1 TABLET (40 MG TOTAL) BY MOUTH DAILY. 90 tablet 3  . phenytoin (DILANTIN) 100 MG ER capsule 100 mg every morning and 200 mg every night. 270 capsule 3  . polyethylene glycol (MIRALAX / GLYCOLAX) packet Take 17 g by mouth daily as needed for mild constipation. 30 each 11  . promethazine (PHENERGAN) 25 MG tablet Take 1 tablet (25 mg total) by mouth daily. 90 tablet 1  . traZODone (DESYREL) 100 MG tablet Take 1 tablet (100 mg total) by mouth at bedtime. 90 tablet 3  . ALPRAZolam (XANAX) 0.25 MG tablet Take 1 tablet (0.25 mg total) by mouth 3 (three) times daily as needed for sleep or anxiety. Take one tablet by mouth 2-3 times daily as needed for anxiety   (3 months supply) (Patient taking differently: Take 0.25 mg by mouth at bedtime. ) 270 tablet 1  .  gabapentin (NEURONTIN) 100 MG capsule TAKE 1 CAPSULE (100 MG TOTAL) BY MOUTH 3 (THREE) TIMES DAILY. 3 MONTHS SUPPLY (Patient taking differently: Take 100 mg by mouth 3 (three) times daily. TAKE 1 CAPSULE (100 MG TOTAL) BY MOUTH 3 (THREE) TIMES DAILY. 3 MONTHS SUPPLY) 270 capsule 3  . albuterol (PROVENTIL HFA;VENTOLIN  HFA) 108 (90 BASE) MCG/ACT inhaler Inhale 2 puffs into the lungs every 6 (six) hours as needed for wheezing. 1 Inhaler 5  . naproxen sodium (ANAPROX) 220 MG tablet Take 220 mg by mouth daily as needed (headaches). Reported on 09/08/2015    . ciprofloxacin (CIPRO) 250 MG tablet Take 1 tablet (250 mg total) by mouth 2 (two) times daily. 8 tablet 0  . cycloSPORINE (RESTASIS) 0.05 % ophthalmic emulsion Place 1 drop into both eyes 2 (two) times daily. (Patient not taking: Reported on 09/08/2015) 0.4 mL 0  . fluconazole (DIFLUCAN) 150 MG tablet Take 1 tablet (150 mg total) by mouth once. 1 tablet 2  . ketoconazole (NIZORAL) 2 % cream APPLY TOPICALLY DAILY. (Patient not taking: Reported on 09/08/2015) 45 g 1  . SUMAtriptan (IMITREX) 100 MG tablet Take 1 tablet (100 mg total) by mouth every 2 (two) hours as needed for migraine or headache. May repeat in 2 hours if headache persists or recurs. (Patient not taking: Reported on 09/08/2015) 10 tablet 3   No facility-administered medications prior to visit.    ROS Review of Systems  Constitutional: Positive for diaphoresis. Negative for fever, chills and fatigue.  HENT: Positive for congestion and sore throat.   Respiratory: Positive for cough, chest tightness and wheezing. Negative for shortness of breath.   Cardiovascular: Negative for chest pain, palpitations and leg swelling.  Gastrointestinal: Negative for nausea, vomiting and diarrhea.  Skin: Negative for rash.  Neurological: Positive for headaches. Negative for dizziness and numbness.    Objective:  BP 140/40 mmHg  Pulse 82  Temp(Src) 97.7 F (36.5 C) (Oral)  Ht '5\' 5"'$  (1.651 m)  Wt 172 lb 2 oz (78.075 kg)  BMI 28.64 kg/m2  SpO2 27% Low diastolic number traditionally in the low 50's and 40's.   Physical Exam  Constitutional: She is oriented to person, place, and time. She appears well-developed and well-nourished. No distress.  HENT:  Head: Normocephalic and atraumatic.  Right Ear: External  ear normal.  Left Ear: External ear normal.  Mouth/Throat: Oropharynx is clear and moist. No oropharyngeal exudate.  TMs clear bilaterally  Eyes: EOM are normal. Pupils are equal, round, and reactive to light. Right eye exhibits no discharge. Left eye exhibits no discharge. No scleral icterus.  Neck: Normal range of motion. Neck supple.  Cardiovascular: Normal rate, regular rhythm and normal heart sounds.  Exam reveals no gallop and no friction rub.   No murmur heard. Pulmonary/Chest: Effort normal. No respiratory distress. She has no wheezes. She has no rales. She exhibits no tenderness.  Scattered rhonchi  Lymphadenopathy:    She has no cervical adenopathy.  Neurological: She is alert and oriented to person, place, and time.  Skin: Skin is warm and dry. No rash noted. She is not diaphoretic.  Psychiatric: She has a normal mood and affect. Her behavior is normal. Judgment and thought content normal.   Assessment & Plan:   Kaylee Hansen was seen today for cough and wheezing.  Diagnoses and all orders for this visit:  COPD exacerbation ([place]) -     DG Chest 2 View; Future  Other orders -     azithromycin (ZITHROMAX) 250  MG tablet; Take 2 tabs the first day. 1 Tab every day after until completed.   I have discontinued Kaylee Hansen's cycloSPORINE, ALPRAZolam, gabapentin, fluconazole, ketoconazole, SUMAtriptan, and ciprofloxacin. I am also having her start on azithromycin. Additionally, I am having her maintain her NON FORMULARY, albuterol, promethazine, (DiphenhydrAMINE HCl, Sleep, (ZZZQUIL PO)), phenytoin, naproxen sodium, levothyroxine, traZODone, PARoxetine, polyethylene glycol, fentaNYL, HYDROcodone-acetaminophen, albuterol, ergocalciferol, and pantoprazole.  Meds ordered this encounter  Medications  . azithromycin (ZITHROMAX) 250 MG tablet    Sig: Take 2 tabs the first day. 1 Tab every day after until completed.    Dispense:  6 tablet    Refill:  0     Follow-up: Return if symptoms  worsen or fail to improve.

## 2015-09-08 NOTE — Progress Notes (Signed)
Pre visit review using our clinic review tool, if applicable. No additional management support is needed unless otherwise documented below in the visit note. 

## 2015-09-09 MED ORDER — AZITHROMYCIN 250 MG PO TABS: ORAL_TABLET | ORAL | Status: DC

## 2015-09-12 NOTE — Assessment & Plan Note (Signed)
New Onset Due to length of symptoms with worsening will treat empirically  Azithromycin was sent to the pharmacy [facility] Chest x-ray today  Advised to work on smoking cessation or this will be commmon Continue OTC measures  FU prn worsening/failure to improve.

## 2015-09-24 ENCOUNTER — Telehealth: Payer: Self-pay | Admitting: Internal Medicine

## 2015-09-24 NOTE — Telephone Encounter (Signed)
Patient dropped off [employer] summons requesting letter to be released from duty.  Patient would like call back in regards to when she will be able to pick up letter because her 10 days is almost up.

## 2015-09-27 NOTE — Telephone Encounter (Signed)
Pls write a letter I don't have a form Thx

## 2015-09-27 NOTE — Telephone Encounter (Signed)
Dr. Alain Marion, do you have these forms?

## 2015-09-27 NOTE — Telephone Encounter (Signed)
Pt callled in and asked about his paperwork again.  Marzetta Board , do you happen to now where this might be?

## 2015-09-27 NOTE — Telephone Encounter (Signed)
Letter printed and form in MD's red folder for completion on 09/28/14.

## 2015-09-28 NOTE — Telephone Encounter (Signed)
Letter and form upfront for p/u. Pt's husband informed.

## 2015-10-12 ENCOUNTER — Ambulatory Visit (INDEPENDENT_AMBULATORY_CARE_PROVIDER_SITE_OTHER): Payer: Medicare Other | Admitting: Internal Medicine

## 2015-10-12 ENCOUNTER — Encounter: Payer: Self-pay | Admitting: Internal Medicine

## 2015-10-12 VITALS — BP 130/58 | HR 83 | Wt 171.0 lb

## 2015-10-12 DIAGNOSIS — K5901 Slow transit constipation: Secondary | ICD-10-CM | POA: Diagnosis not present

## 2015-10-12 DIAGNOSIS — G4734 Idiopathic sleep related nonobstructive alveolar hypoventilation: Secondary | ICD-10-CM | POA: Diagnosis not present

## 2015-10-12 DIAGNOSIS — M5440 Lumbago with sciatica, unspecified side: Secondary | ICD-10-CM

## 2015-10-12 DIAGNOSIS — G40909 Epilepsy, unspecified, not intractable, without status epilepticus: Secondary | ICD-10-CM

## 2015-10-12 DIAGNOSIS — E559 Vitamin D deficiency, unspecified: Secondary | ICD-10-CM

## 2015-10-12 DIAGNOSIS — R270 Ataxia, unspecified: Secondary | ICD-10-CM

## 2015-10-12 MED ORDER — HYDROCODONE-ACETAMINOPHEN 10-325 MG PO TABS: 1.0000 | ORAL_TABLET | Freq: Four times a day (QID) | ORAL | Status: DC | PRN

## 2015-10-12 MED ORDER — LINACLOTIDE 290 MCG PO CAPS: 290.0000 ug | ORAL_CAPSULE | Freq: Every day | ORAL | Status: DC

## 2015-10-12 MED ORDER — FENTANYL 12 MCG/HR TD PT72: 12.5000 ug | MEDICATED_PATCH | TRANSDERMAL | Status: DC

## 2015-10-12 NOTE — Assessment & Plan Note (Signed)
In a w/c 

## 2015-10-12 NOTE — Assessment & Plan Note (Signed)
On O2 

## 2015-10-12 NOTE — Assessment & Plan Note (Signed)
Chronic Norco, Fentanyl  Potential benefits of a long term opioids use as well as potential risks (i.e. addiction risk, apnea etc) and complications (i.e. Somnolence, constipation and others) were explained to the patient and were aknowledged. 

## 2015-10-12 NOTE — Assessment & Plan Note (Signed)
4/17 Linzess to try

## 2015-10-12 NOTE — Progress Notes (Signed)
Pre visit review using our clinic review tool, if applicable. No additional management support is needed unless otherwise documented below in the visit note. 

## 2015-10-12 NOTE — Progress Notes (Signed)
Subjective:  Patient ID: Kaylee Hansen, female    DOB: [birth year]  Age: 60 y.o. MRN: [id number]  CC: No chief complaint on file.   HPI Kaylee Hansen presents for chronic pain, GERD, anxiety f/u. C/o bad constipation...  Outpatient Prescriptions Prior to Visit  Medication Sig Dispense Refill  . albuterol (PROVENTIL) (2.5 MG/3ML) 0.083% nebulizer solution Take 3 mLs (2.5 mg total) by nebulization 4 (four) times daily. 120 vial 11  . azithromycin (ZITHROMAX) 250 MG tablet Take 2 tabs the first day. 1 Tab every day after until completed. 6 tablet 0  . DiphenhydrAMINE HCl, Sleep, (ZZZQUIL PO) Take 1 tablet by mouth at bedtime.    . ergocalciferol (VITAMIN D2) 50000 units capsule Take 1 capsule (50,000 Units total) by mouth once a week. 12 capsule 3  . fentaNYL (DURAGESIC - DOSED MCG/HR) 12 MCG/HR Place 1 patch (12.5 mcg total) onto the skin every 3 (three) days. Please fill on or after 10/19/15 10 patch 0  . HYDROcodone-acetaminophen (NORCO) 10-325 MG tablet Take 1 tablet by mouth every 6 (six) hours as needed for moderate pain or severe pain. Please fill on or after 10/19/15 120 tablet 0  . levothyroxine (SYNTHROID, LEVOTHROID) 150 MCG tablet Take 1 tablet (150 mcg total) by mouth daily. 90 tablet 3  . naproxen sodium (ANAPROX) 220 MG tablet Take 220 mg by mouth daily as needed (headaches). Reported on 09/08/2015    . NON FORMULARY Oxygen- use at night as directed     . pantoprazole (PROTONIX) 40 MG tablet Take 1 tablet (40 mg total) by mouth daily. 90 tablet 3  . PARoxetine (PAXIL) 40 MG tablet TAKE 1 TABLET (40 MG TOTAL) BY MOUTH DAILY. 90 tablet 3  . phenytoin (DILANTIN) 100 MG ER capsule 100 mg every morning and 200 mg every night. 270 capsule 3  . polyethylene glycol (MIRALAX / GLYCOLAX) packet Take 17 g by mouth daily as needed for mild constipation. 30 each 11  . promethazine (PHENERGAN) 25 MG tablet Take 1 tablet (25 mg total) by mouth daily. 90 tablet 1  . traZODone (DESYREL) 100 MG  tablet Take 1 tablet (100 mg total) by mouth at bedtime. 90 tablet 3  . albuterol (PROVENTIL HFA;VENTOLIN HFA) 108 (90 BASE) MCG/ACT inhaler Inhale 2 puffs into the lungs every 6 (six) hours as needed for wheezing. 1 Inhaler 5   No facility-administered medications prior to visit.    ROS Review of Systems  Constitutional: Negative for chills, diaphoresis, activity change, appetite change, fatigue and unexpected weight change.  HENT: Negative for congestion, mouth sores and sinus pressure.   Eyes: Negative for visual disturbance.  Respiratory: Negative for cough and chest tightness.   Gastrointestinal: Positive for constipation. Negative for nausea and abdominal pain.  Genitourinary: Negative for frequency, difficulty urinating and vaginal pain.  Musculoskeletal: Positive for back pain. Negative for gait problem.  Skin: Negative for pallor and rash.  Neurological: Positive for headaches. Negative for dizziness, tremors, weakness and numbness.  Psychiatric/Behavioral: Positive for sleep disturbance and decreased concentration. Negative for behavioral problems and confusion. The patient is nervous/anxious.     Objective:  BP 130/58 mmHg  Pulse 83  Wt 171 lb (77.565 kg)  SpO2 93%  BP Readings from Last 3 Encounters:  10/12/15 130/58  09/08/15 140/40  07/14/15 90/42    Wt Readings from Last 3 Encounters:  10/12/15 171 lb (77.565 kg)  09/08/15 172 lb 2 oz (78.075 kg)  07/14/15 169 lb (76.658 kg)  Physical Exam  Constitutional: She appears well-developed. No distress.  HENT:  Head: Normocephalic.  Right Ear: External ear normal.  Left Ear: External ear normal.  Nose: Nose normal.  Mouth/Throat: Oropharynx is clear and moist.  Eyes: Conjunctivae are normal. Pupils are equal, round, and reactive to light. Right eye exhibits no discharge. Left eye exhibits no discharge.  Neck: Normal range of motion. Neck supple. No JVD present. No tracheal deviation present. No thyromegaly  present.  Cardiovascular: Normal rate, regular rhythm and normal heart sounds.   Pulmonary/Chest: No stridor. No respiratory distress. She has no wheezes.  Abdominal: Soft. Bowel sounds are normal. She exhibits no distension and no mass. There is no tenderness. There is no rebound and no guarding.  Musculoskeletal: She exhibits tenderness. She exhibits no edema.  Lymphadenopathy:    She has no cervical adenopathy.  Neurological: She displays normal reflexes. No cranial nerve deficit. She exhibits normal muscle tone. Coordination normal.  Skin: No rash noted. No erythema.  Psychiatric: She has a normal mood and affect. Her behavior is normal. Judgment and thought content normal.  in a w/c LS tender  Lab Results  Component Value Date   WBC 7.8 07/14/2015   HGB 12.0 07/14/2015   HCT 36.1 07/14/2015   PLT 278.0 07/14/2015   GLUCOSE 82 07/14/2015   CHOL 310* 07/14/2015   TRIG 300.0* 07/14/2015   HDL 38.00* 07/14/2015   LDLDIRECT 187.0 07/14/2015   LDLCALC * 05/12/2009    240        Total Cholesterol/HDL:CHD Risk Coronary Heart Disease Risk Table                     Men   Women  1/2 Average Risk   3.4   3.3  Average Risk       5.0   4.4  2 X Average Risk   9.6   7.1  3 X Average Risk  23.4   11.0        Use the calculated Patient Ratio above and the CHD Risk Table to determine the patient's CHD Risk.        ATP III CLASSIFICATION (LDL):  <100     mg/dL   Optimal  100-129  mg/dL   Near or Above                    Optimal  130-159  mg/dL   Borderline  160-189  mg/dL   High  >190     mg/dL   Very High   ALT 6 07/14/2015   AST 9 07/14/2015   NA 141 07/14/2015   K 4.3 07/14/2015   CL 103 07/14/2015   CREATININE 0.89 07/14/2015   BUN 10 07/14/2015   CO2 33* 07/14/2015   TSH 0.46 07/14/2015   INR 1.0 09/16/2008   HGBA1C  09/17/2008    5.0 (NOTE)   The ADA recommends the following therapeutic goal for glycemic   control related to Hgb A1C measurement:   Goal of Therapy:   <  7.0% Hgb A1C   Reference: American Diabetes Association: Clinical Practice   Recommendations 2008, Diabetes Care,  2008, 31:(Suppl 1).    Dg Chest 2 View  09/08/2015  CLINICAL DATA:  Cough for 2-3 weeks, wheezing, shortness of breath, history of lung carcinoma EXAM: CHEST  2 VIEW COMPARISON:  Chest x-ray of 06/14/2015 and 07/09/2014 FINDINGS: No active infiltrate or effusion is seen. Mediastinal and hilar contours are unremarkable. The heart is  borderline enlarged and stable. There are old bilateral rib fractures primarily involving the right posterior and lower fifth, sixth, seventh, and eighth ribs as well as the left anterolateral sixth and seventh ribs. The thoracic vertebrae are in normal alignment. IMPRESSION: 1. No active cardiopulmonary disease. 2. Old healed rib fractures bilaterally. Electronically Signed   By: Ivar Drape M.D.   On: 09/08/2015 10:16    Assessment & Plan:   There are no diagnoses linked to this encounter. I am having Ms. Kienle maintain her NON FORMULARY, albuterol, promethazine, (DiphenhydrAMINE HCl, Sleep, (ZZZQUIL PO)), phenytoin, naproxen sodium, levothyroxine, traZODone, PARoxetine, polyethylene glycol, fentaNYL, HYDROcodone-acetaminophen, albuterol, ergocalciferol, pantoprazole, azithromycin, gabapentin, ketoconazole, and SUMAtriptan.  Meds ordered this encounter  Medications  . gabapentin (NEURONTIN) 100 MG capsule    Sig: Take 1 capsule by mouth 3 (three) times daily.  [name] ketoconazole (NIZORAL) 2 % cream    Sig: as needed.  . SUMAtriptan (IMITREX) 100 MG tablet    Sig: as needed.     Follow-up: No Follow-up on file.  Walker Kehr, MD

## 2015-10-12 NOTE — Assessment & Plan Note (Signed)
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  

## 2015-10-12 NOTE — Assessment & Plan Note (Signed)
On Vit D 

## 2015-11-26 LAB — HM MAMMOGRAPHY

## 2015-12-01 ENCOUNTER — Encounter: Payer: Self-pay | Admitting: Internal Medicine

## 2015-12-22 ENCOUNTER — Ambulatory Visit (INDEPENDENT_AMBULATORY_CARE_PROVIDER_SITE_OTHER): Payer: Medicare Other | Admitting: Internal Medicine

## 2015-12-22 ENCOUNTER — Encounter: Payer: Self-pay | Admitting: Internal Medicine

## 2015-12-22 VITALS — BP 118/50 | HR 90 | Temp 98.6°F | Resp 16 | Ht 65.0 in | Wt 160.0 lb

## 2015-12-22 DIAGNOSIS — N3000 Acute cystitis without hematuria: Secondary | ICD-10-CM | POA: Diagnosis not present

## 2015-12-22 MED ORDER — CIPROFLOXACIN HCL 250 MG PO TABS: 250.0000 mg | ORAL_TABLET | Freq: Two times a day (BID) | ORAL | Status: DC

## 2015-12-22 NOTE — Progress Notes (Signed)
Subjective:  Patient ID: Kaylee Hansen, female    DOB: 1955-09-14  Age: 60 y.o. MRN: [id number]  CC: Dysuria  NEW TO ME   HPI Kaylee Hansen presents for a 5 day history of dysuria, frequency, and urgency. She has not noticed any blood in her urine and she denies nausea, vomiting, fever, chills, abd pain, diarrhea, or constipation. She said she has a bladder infection about every 3-4 months but she does not know why she is so prone to urinary tract infections.  Outpatient Prescriptions Prior to Visit  Medication Sig Dispense Refill  . albuterol (PROVENTIL) (2.5 MG/3ML) 0.083% nebulizer solution Take 3 mLs (2.5 mg total) by nebulization 4 (four) times daily. 120 vial 11  . DiphenhydrAMINE HCl, Sleep, (ZZZQUIL PO) Take 1 tablet by mouth at bedtime.    . ergocalciferol (VITAMIN D2) 50000 units capsule Take 1 capsule (50,000 Units total) by mouth once a week. 12 capsule 3  . fentaNYL (DURAGESIC - DOSED MCG/HR) 12 MCG/HR Place 1 patch (12.5 mcg total) onto the skin every 3 (three) days. Please fill on or after 01/18/16 10 patch 0  . gabapentin (NEURONTIN) 100 MG capsule Take 1 capsule by mouth 3 (three) times daily.    [name] HYDROcodone-acetaminophen (NORCO) 10-325 MG tablet Take 1 tablet by mouth every 6 (six) hours as needed for moderate pain or severe pain. Please fill on or after 01/18/16 120 tablet 0  . ketoconazole (NIZORAL) 2 % cream as needed.    [name] levothyroxine (SYNTHROID, LEVOTHROID) 150 MCG tablet Take 1 tablet (150 mcg total) by mouth daily. 90 tablet 3  . linaclotide (LINZESS) 290 MCG CAPS capsule Take 1 capsule (290 mcg total) by mouth daily before breakfast. 30 capsule 11  . NON FORMULARY Oxygen- use at night as directed     . pantoprazole (PROTONIX) 40 MG tablet Take 1 tablet (40 mg total) by mouth daily. 90 tablet 3  . PARoxetine (PAXIL) 40 MG tablet TAKE 1 TABLET (40 MG TOTAL) BY MOUTH DAILY. 90 tablet 3  . phenytoin (DILANTIN) 100 MG ER capsule 100 mg every morning and 200 mg every  night. 270 capsule 3  . promethazine (PHENERGAN) 25 MG tablet Take 1 tablet (25 mg total) by mouth daily. 90 tablet 1  . SUMAtriptan (IMITREX) 100 MG tablet as needed.    . traZODone (DESYREL) 100 MG tablet Take 1 tablet (100 mg total) by mouth at bedtime. 90 tablet 3  . albuterol (PROVENTIL HFA;VENTOLIN HFA) 108 (90 BASE) MCG/ACT inhaler Inhale 2 puffs into the lungs every 6 (six) hours as needed for wheezing. 1 Inhaler 5   No facility-administered medications prior to visit.    ROS Review of Systems  Constitutional: Negative for fever, chills, diaphoresis and fatigue.  HENT: Negative.   Eyes: Negative.   Respiratory: Negative.  Negative for cough, choking, shortness of breath and stridor.   Cardiovascular: Negative.  Negative for chest pain, palpitations and leg swelling.  Gastrointestinal: Negative.  Negative for nausea, abdominal pain, diarrhea and constipation.  Endocrine: Negative.   Genitourinary: Positive for dysuria, urgency and frequency. Negative for hematuria, flank pain, decreased urine volume, vaginal bleeding, vaginal discharge, difficulty urinating and pelvic pain.  Musculoskeletal: Negative.  Negative for myalgias, back pain, joint swelling and arthralgias.  Skin: Negative.  Negative for color change and rash.  Allergic/Immunologic: Negative.   Neurological: Negative.   Hematological: Negative.   Psychiatric/Behavioral: Negative.     Objective:  BP 118/50 mmHg  Pulse 90  Temp(Src) 98.6  F (37 C) (Oral)  Resp 16  Ht '5\' 5"'$  (1.651 m)  Wt 160 lb (72.576 kg)  BMI 26.63 kg/m2  SpO2 94%  BP Readings from Last 3 Encounters:  12/22/15 118/50  10/12/15 130/58  09/08/15 140/40    Wt Readings from Last 3 Encounters:  12/22/15 160 lb (72.576 kg)  10/12/15 171 lb (77.565 kg)  09/08/15 172 lb 2 oz (78.075 kg)    Physical Exam  Constitutional: She is oriented to person, place, and time.  Non-toxic appearance. She has a sickly appearance. She does not appear ill. No  distress.  Wheelchair-bound  HENT:  Mouth/Throat: Oropharynx is clear and moist. No oropharyngeal exudate.  Eyes: Conjunctivae are normal. Right eye exhibits no discharge. Left eye exhibits no discharge. No scleral icterus.  Neck: Normal range of motion. Neck supple. No JVD present. No tracheal deviation present. No thyromegaly present.  Cardiovascular: Normal rate, regular rhythm, normal heart sounds and intact distal pulses.  Exam reveals no gallop and no friction rub.   No murmur heard. Pulmonary/Chest: Effort normal. No accessory muscle usage or stridor. No respiratory distress. She has no decreased breath sounds. She has no wheezes. She has rhonchi in the right upper field, the right middle field, the right lower field, the left upper field, the left middle field and the left lower field. She has no rales.  Abdominal: Soft. Bowel sounds are normal. She exhibits no distension and no mass. There is no hepatosplenomegaly. There is no tenderness. There is no rebound, no guarding and no CVA tenderness.  Musculoskeletal: Normal range of motion. She exhibits no edema or tenderness.  Lymphadenopathy:    She has no cervical adenopathy.  Neurological: She is oriented to person, place, and time.  Skin: Skin is warm and dry. No rash noted. She is not diaphoretic. No erythema. No pallor.  Vitals reviewed.   Lab Results  Component Value Date   WBC 7.8 07/14/2015   HGB 12.0 07/14/2015   HCT 36.1 07/14/2015   PLT 278.0 07/14/2015   GLUCOSE 82 07/14/2015   CHOL 310* 07/14/2015   TRIG 300.0* 07/14/2015   HDL 38.00* 07/14/2015   LDLDIRECT 187.0 07/14/2015   LDLCALC * 05/12/2009    240        Total Cholesterol/HDL:CHD Risk Coronary Heart Disease Risk Table                     Men   Women  1/2 Average Risk   3.4   3.3  Average Risk       5.0   4.4  2 X Average Risk   9.6   7.1  3 X Average Risk  23.4   11.0        Use the calculated Patient Ratio above and the CHD Risk Table to determine the  patient's CHD Risk.        ATP III CLASSIFICATION (LDL):  <100     mg/dL   Optimal  100-129  mg/dL   Near or Above                    Optimal  130-159  mg/dL   Borderline  160-189  mg/dL   High  >190     mg/dL   Very High   ALT 6 07/14/2015   AST 9 07/14/2015   NA 141 07/14/2015   K 4.3 07/14/2015   CL 103 07/14/2015   CREATININE 0.89 07/14/2015   BUN 10 07/14/2015  CO2 33* 07/14/2015   TSH 0.46 07/14/2015   INR 1.0 09/16/2008   HGBA1C  09/17/2008    5.0 (NOTE)   The ADA recommends the following therapeutic goal for glycemic   control related to Hgb A1C measurement:   Goal of Therapy:   < 7.0% Hgb A1C   Reference: American Diabetes Association: Clinical Practice   Recommendations 2008, Diabetes Care,  2008, 31:(Suppl 1).    Dg Chest 2 View  09/08/2015  CLINICAL DATA:  Cough for 2-3 weeks, wheezing, shortness of breath, history of lung carcinoma EXAM: CHEST  2 VIEW COMPARISON:  Chest x-ray of 06/14/2015 and 07/09/2014 FINDINGS: No active infiltrate or effusion is seen. Mediastinal and hilar contours are unremarkable. The heart is borderline enlarged and stable. There are old bilateral rib fractures primarily involving the right posterior and lower fifth, sixth, seventh, and eighth ribs as well as the left anterolateral sixth and seventh ribs. The thoracic vertebrae are in normal alignment. IMPRESSION: 1. No active cardiopulmonary disease. 2. Old healed rib fractures bilaterally. Electronically Signed   By: Ivar Drape M.D.   On: 09/08/2015 10:16    Assessment & Plan:   Latangela was seen today for dysuria.  Diagnoses and all orders for this visit:  Acute cystitis without hematuria- I will check her UA and urine culture to confirm the presence of a bacterial UTI, for now will treat empirically with Cipro, will adjust the antibiotic regimen pending the culture and sensitivity. -     Urinalysis, Routine w reflex microscopic (not at [facility]); Future -     CULTURE, URINE COMPREHENSIVE;  Future -     ciprofloxacin (CIPRO) 250 MG tablet; Take 1 tablet (250 mg total) by mouth 2 (two) times daily.   I am having Kaylee Hansen start on ciprofloxacin. I am also having her maintain her NON FORMULARY, albuterol, promethazine, (DiphenhydrAMINE HCl, Sleep, (ZZZQUIL PO)), phenytoin, levothyroxine, traZODone, PARoxetine, albuterol, ergocalciferol, pantoprazole, gabapentin, ketoconazole, SUMAtriptan, linaclotide, fentaNYL, and HYDROcodone-acetaminophen.  Meds ordered this encounter  Medications  . ciprofloxacin (CIPRO) 250 MG tablet    Sig: Take 1 tablet (250 mg total) by mouth 2 (two) times daily.    Dispense:  10 tablet    Refill:  0     Follow-up: Return in about 3 weeks (around 01/12/2016).  Scarlette Calico, MD

## 2015-12-22 NOTE — Progress Notes (Signed)
Pre visit review using our clinic review tool, if applicable. No additional management support is needed unless otherwise documented below in the visit note. 

## 2015-12-22 NOTE — Patient Instructions (Signed)

## 2016-01-13 ENCOUNTER — Encounter: Payer: Self-pay | Admitting: Internal Medicine

## 2016-01-13 ENCOUNTER — Ambulatory Visit (INDEPENDENT_AMBULATORY_CARE_PROVIDER_SITE_OTHER): Payer: Medicare Other | Admitting: Internal Medicine

## 2016-01-13 VITALS — BP 120/50 | HR 80 | Wt 159.0 lb

## 2016-01-13 DIAGNOSIS — F321 Major depressive disorder, single episode, moderate: Secondary | ICD-10-CM

## 2016-01-13 DIAGNOSIS — G40909 Epilepsy, unspecified, not intractable, without status epilepticus: Secondary | ICD-10-CM | POA: Diagnosis not present

## 2016-01-13 DIAGNOSIS — E034 Atrophy of thyroid (acquired): Secondary | ICD-10-CM

## 2016-01-13 DIAGNOSIS — I10 Essential (primary) hypertension: Secondary | ICD-10-CM | POA: Diagnosis not present

## 2016-01-13 DIAGNOSIS — F411 Generalized anxiety disorder: Secondary | ICD-10-CM

## 2016-01-13 DIAGNOSIS — E038 Other specified hypothyroidism: Secondary | ICD-10-CM | POA: Diagnosis not present

## 2016-01-13 DIAGNOSIS — M5440 Lumbago with sciatica, unspecified side: Secondary | ICD-10-CM

## 2016-01-13 MED ORDER — UMECLIDINIUM-VILANTEROL 62.5-25 MCG/INH IN AEPB: 1.0000 | INHALATION_SPRAY | Freq: Every day | RESPIRATORY_TRACT | Status: DC

## 2016-01-13 MED ORDER — HYDROCODONE-ACETAMINOPHEN 10-325 MG PO TABS: 1.0000 | ORAL_TABLET | Freq: Four times a day (QID) | ORAL | Status: DC | PRN

## 2016-01-13 MED ORDER — PHENYTOIN SODIUM EXTENDED 100 MG PO CAPS: ORAL_CAPSULE | ORAL | Status: DC

## 2016-01-13 NOTE — Assessment & Plan Note (Signed)
Norco  Potential benefits of a long term opioids use as well as potential risks (i.e. addiction risk, apnea etc) and complications (i.e. Somnolence, constipation and others) were explained to the patient and were aknowledged. 

## 2016-01-13 NOTE — Assessment & Plan Note (Signed)
Trazodone at hs Xanax prn  Potential benefits of a long term benzodiazepines  use as well as potential risks  and complications were explained to the patient and were aknowledged.

## 2016-01-13 NOTE — Assessment & Plan Note (Signed)
On Levothroid 

## 2016-01-13 NOTE — Assessment & Plan Note (Signed)
Trazodone at hs

## 2016-01-13 NOTE — Progress Notes (Signed)
Pre visit review using our clinic review tool, if applicable. No additional management support is needed unless otherwise documented below in the visit note. 

## 2016-01-13 NOTE — Assessment & Plan Note (Signed)
NAS diet 

## 2016-01-13 NOTE — Assessment & Plan Note (Signed)
Phenytoin  Potential benefits of a long term dilantin use as well as potential risks  and complications were explained to the patient and were aknowledged.

## 2016-01-13 NOTE — Progress Notes (Signed)
Subjective:  Patient ID: [name], female    DOB: Jan 10, 1956  Age: 60 y.o. MRN: [id number]  CC: No chief complaint on file.   HPI Adelin L Soden presents for chronic LBP, hypothyroidism, seizure f/u  C/o rash between breasts.  Would llike to d/c fentanyl   Outpatient Prescriptions Prior to Visit  Medication Sig Dispense Refill  . albuterol (PROVENTIL) (2.5 MG/3ML) 0.083% nebulizer solution Take 3 mLs (2.5 mg total) by nebulization 4 (four) times daily. 120 vial 11  . ciprofloxacin (CIPRO) 250 MG tablet Take 1 tablet (250 mg total) by mouth 2 (two) times daily. 10 tablet 0  . DiphenhydrAMINE HCl, Sleep, (ZZZQUIL PO) Take 1 tablet by mouth at bedtime.    . ergocalciferol (VITAMIN D2) 50000 units capsule Take 1 capsule (50,000 Units total) by mouth once a week. 12 capsule 3  . fentaNYL (DURAGESIC - DOSED MCG/HR) 12 MCG/HR Place 1 patch (12.5 mcg total) onto the skin every 3 (three) days. Please fill on or after 01/18/16 10 patch 0  . gabapentin (NEURONTIN) 100 MG capsule Take 1 capsule by mouth 3 (three) times daily.    [name] HYDROcodone-acetaminophen (NORCO) 10-325 MG tablet Take 1 tablet by mouth every 6 (six) hours as needed for moderate pain or severe pain. Please fill on or after 01/18/16 120 tablet 0  . ketoconazole (NIZORAL) 2 % cream as needed.    [name] levothyroxine (SYNTHROID, LEVOTHROID) 150 MCG tablet Take 1 tablet (150 mcg total) by mouth daily. 90 tablet 3  . linaclotide (LINZESS) 290 MCG CAPS capsule Take 1 capsule (290 mcg total) by mouth daily before breakfast. 30 capsule 11  . NON FORMULARY Oxygen- use at night as directed     . pantoprazole (PROTONIX) 40 MG tablet Take 1 tablet (40 mg total) by mouth daily. 90 tablet 3  . PARoxetine (PAXIL) 40 MG tablet TAKE 1 TABLET (40 MG TOTAL) BY MOUTH DAILY. 90 tablet 3  . phenytoin (DILANTIN) 100 MG ER capsule 100 mg every morning and 200 mg every night. 270 capsule 3  . promethazine (PHENERGAN) 25 MG tablet Take 1 tablet (25 mg total)  by mouth daily. 90 tablet 1  . SUMAtriptan (IMITREX) 100 MG tablet as needed.    . traZODone (DESYREL) 100 MG tablet Take 1 tablet (100 mg total) by mouth at bedtime. 90 tablet 3  . albuterol (PROVENTIL HFA;VENTOLIN HFA) 108 (90 BASE) MCG/ACT inhaler Inhale 2 puffs into the lungs every 6 (six) hours as needed for wheezing. 1 Inhaler 5   No facility-administered medications prior to visit.    ROS Review of Systems  Constitutional: Positive for fatigue. Negative for chills, activity change, appetite change and unexpected weight change.  HENT: Negative for congestion, mouth sores and sinus pressure.   Eyes: Negative for visual disturbance.  Respiratory: Negative for cough and chest tightness.   Gastrointestinal: Negative for nausea and abdominal pain.  Genitourinary: Negative for frequency, difficulty urinating and vaginal pain.  Musculoskeletal: Positive for back pain. Negative for gait problem.  Skin: Negative for pallor and rash.  Neurological: Positive for weakness and headaches. Negative for dizziness, tremors and numbness.  Psychiatric/Behavioral: Negative for suicidal ideas, confusion and sleep disturbance. The patient is nervous/anxious.     Objective:  BP 120/50 mmHg  Pulse 80  Wt 159 lb (72.122 kg)  SpO2 93%  BP Readings from Last 3 Encounters:  01/13/16 120/50  12/22/15 118/50  10/12/15 130/58    Wt Readings from Last 3 Encounters:  01/13/16 159  lb (72.122 kg)  12/22/15 160 lb (72.576 kg)  10/12/15 171 lb (77.565 kg)    Physical Exam  Constitutional: She appears well-developed. No distress.  HENT:  Head: Normocephalic.  Right Ear: External ear normal.  Left Ear: External ear normal.  Nose: Nose normal.  Mouth/Throat: Oropharynx is clear and moist.  Eyes: Conjunctivae are normal. Pupils are equal, round, and reactive to light. Right eye exhibits no discharge. Left eye exhibits no discharge.  Neck: Normal range of motion. Neck supple. No JVD present. No tracheal  deviation present. No thyromegaly present.  Cardiovascular: Normal rate, regular rhythm and normal heart sounds.   Pulmonary/Chest: No stridor. No respiratory distress. She has no wheezes.  Abdominal: Soft. Bowel sounds are normal. She exhibits no distension and no mass. There is no tenderness. There is no rebound and no guarding.  Musculoskeletal: She exhibits tenderness. She exhibits no edema.  Lymphadenopathy:    She has no cervical adenopathy.  Neurological: She displays normal reflexes. No cranial nerve deficit. She exhibits normal muscle tone. Coordination abnormal.  Skin: No rash noted. She is not diaphoretic. No erythema.  Psychiatric: She has a normal mood and affect. Her behavior is normal. Judgment and thought content normal.  In a w/c  B rhonchi  I personally provided Anoro inhaler use teaching. After the teaching patient was able to demonstrate it's use effectively. All questions were answered  Lab Results  Component Value Date   WBC 7.8 07/14/2015   HGB 12.0 07/14/2015   HCT 36.1 07/14/2015   PLT 278.0 07/14/2015   GLUCOSE 82 07/14/2015   CHOL 310* 07/14/2015   TRIG 300.0* 07/14/2015   HDL 38.00* 07/14/2015   LDLDIRECT 187.0 07/14/2015   LDLCALC * 05/12/2009    240        Total Cholesterol/HDL:CHD Risk Coronary Heart Disease Risk Table                     Men   Women  1/2 Average Risk   3.4   3.3  Average Risk       5.0   4.4  2 X Average Risk   9.6   7.1  3 X Average Risk  23.4   11.0        Use the calculated Patient Ratio above and the CHD Risk Table to determine the patient's CHD Risk.        ATP III CLASSIFICATION (LDL):  <100     mg/dL   Optimal  100-129  mg/dL   Near or Above                    Optimal  130-159  mg/dL   Borderline  160-189  mg/dL   High  >190     mg/dL   Very High   ALT 6 07/14/2015   AST 9 07/14/2015   NA 141 07/14/2015   K 4.3 07/14/2015   CL 103 07/14/2015   CREATININE 0.89 07/14/2015   BUN 10 07/14/2015   CO2 33*  07/14/2015   TSH 0.46 07/14/2015   INR 1.0 09/16/2008   HGBA1C  09/17/2008    5.0 (NOTE)   The ADA recommends the following therapeutic goal for glycemic   control related to Hgb A1C measurement:   Goal of Therapy:   < 7.0% Hgb A1C   Reference: American Diabetes Association: Clinical Practice   Recommendations 2008, Diabetes Care,  2008, 31:(Suppl 1).    Dg Chest 2 View  09/08/2015  CLINICAL DATA:  Cough for 2-3 weeks, wheezing, shortness of breath, history of lung carcinoma EXAM: CHEST  2 VIEW COMPARISON:  Chest x-ray of 06/14/2015 and 07/09/2014 FINDINGS: No active infiltrate or effusion is seen. Mediastinal and hilar contours are unremarkable. The heart is borderline enlarged and stable. There are old bilateral rib fractures primarily involving the right posterior and lower fifth, sixth, seventh, and eighth ribs as well as the left anterolateral sixth and seventh ribs. The thoracic vertebrae are in normal alignment. IMPRESSION: 1. No active cardiopulmonary disease. 2. Old healed rib fractures bilaterally. Electronically Signed   By: Ivar Drape M.D.   On: 09/08/2015 10:16    Assessment & Plan:   There are no diagnoses linked to this encounter. I am having Ms. Komperda maintain her NON FORMULARY, albuterol, promethazine, (DiphenhydrAMINE HCl, Sleep, (ZZZQUIL PO)), phenytoin, levothyroxine, traZODone, PARoxetine, albuterol, ergocalciferol, pantoprazole, gabapentin, ketoconazole, SUMAtriptan, linaclotide, fentaNYL, HYDROcodone-acetaminophen, and ciprofloxacin.  No orders of the defined types were placed in this encounter.     Follow-up: No Follow-up on file.  Walker Kehr, MD

## 2016-02-22 ENCOUNTER — Other Ambulatory Visit (HOSPITAL_COMMUNITY): Payer: Self-pay | Admitting: Orthopedic Surgery

## 2016-02-22 DIAGNOSIS — G8929 Other chronic pain: Secondary | ICD-10-CM

## 2016-02-22 DIAGNOSIS — M25562 Pain in left knee: Principal | ICD-10-CM

## 2016-03-07 ENCOUNTER — Encounter (HOSPITAL_COMMUNITY)
Admission: RE | Admit: 2016-03-07 | Discharge: 2016-03-07 | Disposition: A | Discharge status: Home / Self Care | Payer: Medicare Other | Source: Ambulatory Visit | Attending: Orthopedic Surgery | Admitting: Orthopedic Surgery

## 2016-03-07 DIAGNOSIS — M25562 Pain in left knee: Secondary | ICD-10-CM | POA: Diagnosis not present

## 2016-03-07 DIAGNOSIS — G8929 Other chronic pain: Secondary | ICD-10-CM | POA: Diagnosis present

## 2016-03-07 MED ORDER — TECHNETIUM TC 99M MEDRONATE IV KIT
21.0000 | PACK | Freq: Once | INTRAVENOUS | Status: AC | PRN
  Administered 2016-03-07: 21 via INTRAVENOUS

## 2016-04-17 ENCOUNTER — Ambulatory Visit (INDEPENDENT_AMBULATORY_CARE_PROVIDER_SITE_OTHER): Payer: Medicare Other | Admitting: Internal Medicine

## 2016-04-17 ENCOUNTER — Encounter: Payer: Self-pay | Admitting: Internal Medicine

## 2016-04-17 DIAGNOSIS — R270 Ataxia, unspecified: Secondary | ICD-10-CM | POA: Diagnosis not present

## 2016-04-17 DIAGNOSIS — E034 Atrophy of thyroid (acquired): Secondary | ICD-10-CM

## 2016-04-17 DIAGNOSIS — T24209D Burn of second degree of unspecified site of unspecified lower limb, except ankle and foot, subsequent encounter: Secondary | ICD-10-CM

## 2016-04-17 DIAGNOSIS — M5440 Lumbago with sciatica, unspecified side: Secondary | ICD-10-CM | POA: Diagnosis not present

## 2016-04-17 DIAGNOSIS — J441 Chronic obstructive pulmonary disease with (acute) exacerbation: Secondary | ICD-10-CM

## 2016-04-17 DIAGNOSIS — T24209A Burn of second degree of unspecified site of unspecified lower limb, except ankle and foot, initial encounter: Secondary | ICD-10-CM | POA: Insufficient documentation

## 2016-04-17 MED ORDER — PAROXETINE HCL 40 MG PO TABS
ORAL_TABLET | ORAL | 3 refills | Status: DC
Start: 2016-04-17 — End: 2016-10-30

## 2016-04-17 MED ORDER — HYDROCODONE-ACETAMINOPHEN 10-325 MG PO TABS: 1.0000 | ORAL_TABLET | Freq: Four times a day (QID) | ORAL | 0 refills | Status: DC | PRN

## 2016-04-17 MED ORDER — CYCLOSPORINE 0.05 % OP EMUL: 1.0000 [drp] | Freq: Two times a day (BID) | OPHTHALMIC | 2 refills | Status: DC

## 2016-04-17 MED ORDER — LEVOTHYROXINE SODIUM 150 MCG PO TABS: 150.0000 ug | ORAL_TABLET | Freq: Every day | ORAL | 3 refills | Status: DC

## 2016-04-17 MED ORDER — SSD 1 % EX CREA: TOPICAL_CREAM | Freq: Every day | CUTANEOUS | 1 refills | Status: DC

## 2016-04-17 MED ORDER — TRAZODONE HCL 100 MG PO TABS: 100.0000 mg | ORAL_TABLET | Freq: Every day | ORAL | 3 refills | Status: DC
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
## 2016-04-17 NOTE — Assessment & Plan Note (Signed)
No recent falls In a w/c

## 2016-04-17 NOTE — Assessment & Plan Note (Signed)
L>R See procedure

## 2016-04-17 NOTE — Assessment & Plan Note (Signed)
Smoking discussed 

## 2016-04-17 NOTE — Assessment & Plan Note (Signed)
Chronic Norco  Potential benefits of a long term opioids use as well as potential risks (i.e. addiction risk, apnea etc) and complications (i.e. Somnolence, constipation and others) were explained to the patient and were aknowledged. 

## 2016-04-17 NOTE — Progress Notes (Signed)
Pre visit review using our clinic review tool, if applicable. No additional management support is needed unless otherwise documented below in the visit note. 

## 2016-04-17 NOTE — Assessment & Plan Note (Signed)
On Levothroid 

## 2016-04-17 NOTE — Patient Instructions (Signed)
Change dressing daily

## 2016-04-25 ENCOUNTER — Ambulatory Visit (INDEPENDENT_AMBULATORY_CARE_PROVIDER_SITE_OTHER): Payer: Medicare Other | Admitting: Internal Medicine

## 2016-04-25 ENCOUNTER — Encounter: Payer: Self-pay | Admitting: Internal Medicine

## 2016-04-25 DIAGNOSIS — M5440 Lumbago with sciatica, unspecified side: Secondary | ICD-10-CM

## 2016-04-25 DIAGNOSIS — G8929 Other chronic pain: Secondary | ICD-10-CM | POA: Diagnosis not present

## 2016-04-25 DIAGNOSIS — T24209D Burn of second degree of unspecified site of unspecified lower limb, except ankle and foot, subsequent encounter: Secondary | ICD-10-CM | POA: Diagnosis not present

## 2016-04-25 MED ORDER — HYDROCODONE-ACETAMINOPHEN 10-325 MG PO TABS: 1.0000 | ORAL_TABLET | Freq: Four times a day (QID) | ORAL | 0 refills | Status: DC | PRN

## 2016-04-25 NOTE — Assessment & Plan Note (Signed)
Norco Rx was produced

## 2016-04-25 NOTE — Progress Notes (Signed)
Pre visit review using our clinic review tool, if applicable. No additional management support is needed unless otherwise documented below in the visit note. 

## 2016-04-25 NOTE — Assessment & Plan Note (Signed)
Wounds were inspected, cleaned and dressed. FTF 20 min
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
## 2016-05-02 ENCOUNTER — Encounter: Payer: Self-pay | Admitting: Internal Medicine

## 2016-05-02 ENCOUNTER — Ambulatory Visit (INDEPENDENT_AMBULATORY_CARE_PROVIDER_SITE_OTHER): Payer: Medicare Other | Admitting: Internal Medicine

## 2016-05-02 DIAGNOSIS — T24209D Burn of second degree of unspecified site of unspecified lower limb, except ankle and foot, subsequent encounter: Secondary | ICD-10-CM | POA: Diagnosis not present

## 2016-05-02 NOTE — Progress Notes (Signed)
Pre visit review using our clinic review tool, if applicable. No additional management support is needed unless otherwise documented below in the visit note. 
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
## 2016-05-02 NOTE — Assessment & Plan Note (Signed)
R leg healed L leg healing  L LE wound re-dressed: FTF>15 min

## 2016-06-05 ENCOUNTER — Other Ambulatory Visit: Payer: Self-pay | Admitting: Internal Medicine

## 2016-07-04 ENCOUNTER — Ambulatory Visit (INDEPENDENT_AMBULATORY_CARE_PROVIDER_SITE_OTHER): Payer: Medicare HMO | Admitting: Internal Medicine

## 2016-07-04 ENCOUNTER — Encounter: Payer: Self-pay | Admitting: Internal Medicine

## 2016-07-04 DIAGNOSIS — F17200 Nicotine dependence, unspecified, uncomplicated: Secondary | ICD-10-CM

## 2016-07-04 DIAGNOSIS — I10 Essential (primary) hypertension: Secondary | ICD-10-CM | POA: Diagnosis not present

## 2016-07-04 DIAGNOSIS — G8929 Other chronic pain: Secondary | ICD-10-CM

## 2016-07-04 DIAGNOSIS — G40909 Epilepsy, unspecified, not intractable, without status epilepticus: Secondary | ICD-10-CM

## 2016-07-04 DIAGNOSIS — K5901 Slow transit constipation: Secondary | ICD-10-CM

## 2016-07-04 DIAGNOSIS — K219 Gastro-esophageal reflux disease without esophagitis: Secondary | ICD-10-CM

## 2016-07-04 DIAGNOSIS — M5440 Lumbago with sciatica, unspecified side: Secondary | ICD-10-CM

## 2016-07-04 DIAGNOSIS — Z23 Encounter for immunization: Secondary | ICD-10-CM

## 2016-07-04 DIAGNOSIS — E559 Vitamin D deficiency, unspecified: Secondary | ICD-10-CM

## 2016-07-04 DIAGNOSIS — T24209D Burn of second degree of unspecified site of unspecified lower limb, except ankle and foot, subsequent encounter: Secondary | ICD-10-CM

## 2016-07-04 MED ORDER — VITAMIN D (ERGOCALCIFEROL) 1.25 MG (50000 UNIT) PO CAPS: 50000.0000 [IU] | ORAL_CAPSULE | ORAL | 3 refills | Status: DC

## 2016-07-04 MED ORDER — HYDROCODONE-ACETAMINOPHEN 10-325 MG PO TABS: 1.0000 | ORAL_TABLET | Freq: Four times a day (QID) | ORAL | 0 refills | Status: DC | PRN

## 2016-07-04 NOTE — Assessment & Plan Note (Signed)
Protonix.  ?

## 2016-07-04 NOTE — Assessment & Plan Note (Signed)
healed 

## 2016-07-04 NOTE — Progress Notes (Signed)
Pre visit review using our clinic review tool, if applicable. No additional management support is needed unless otherwise documented below in the visit note. 

## 2016-07-04 NOTE — Assessment & Plan Note (Signed)
No relapse Phenitoin

## 2016-07-04 NOTE — Progress Notes (Signed)
Subjective:  Patient ID: Kaylee Hansen, female    DOB: 1955/08/08  Age: 61 y.o. MRN: [id number]  CC: No chief complaint on file.   HPI Jennene L Lewers presents for LBP, depression, anxiety f/u. Pt is trying to quit smoking...  Outpatient Medications Prior to Visit  Medication Sig Dispense Refill  . albuterol (PROVENTIL) (2.5 MG/3ML) 0.083% nebulizer solution Take 3 mLs (2.5 mg total) by nebulization 4 (four) times daily. 120 vial 11  . cycloSPORINE (RESTASIS) 0.05 % ophthalmic emulsion Place 1 drop into both eyes 2 (two) times daily. 0.4 mL 2  . DiphenhydrAMINE HCl, Sleep, (ZZZQUIL PO) Take 1 tablet by mouth at bedtime.    . gabapentin (NEURONTIN) 100 MG capsule Take 1 capsule by mouth 3 (three) times daily.    [name] HYDROcodone-acetaminophen (NORCO) 10-325 MG tablet Take 1 tablet by mouth every 6 (six) hours as needed for moderate pain or severe pain. Please fill on or after 06/19/16 120 tablet 0  . ketoconazole (NIZORAL) 2 % cream as needed.    [name] levothyroxine (SYNTHROID, LEVOTHROID) 150 MCG tablet Take 1 tablet (150 mcg total) by mouth daily. 90 tablet 3  . linaclotide (LINZESS) 290 MCG CAPS capsule Take 1 capsule (290 mcg total) by mouth daily before breakfast. 30 capsule 11  . NON FORMULARY Oxygen- use at night as directed     . pantoprazole (PROTONIX) 40 MG tablet Take 1 tablet (40 mg total) by mouth daily. 90 tablet 3  . PARoxetine (PAXIL) 40 MG tablet TAKE 1 TABLET (40 MG TOTAL) BY MOUTH DAILY. 90 tablet 3  . phenytoin (DILANTIN) 100 MG ER capsule 100 mg every morning and 200 mg every night. 270 capsule 3  . promethazine (PHENERGAN) 25 MG tablet Take 1 tablet (25 mg total) by mouth daily. 90 tablet 1  . SSD 1 % cream Apply topically daily. Apply to burns 50 g 1  . SUMAtriptan (IMITREX) 100 MG tablet as needed.    . traZODone (DESYREL) 100 MG tablet Take 1 tablet (100 mg total) by mouth at bedtime. 90 tablet 3  . umeclidinium-vilanterol (ANORO ELLIPTA) 62.5-25 MCG/INH AEPB Inhale 1  puff into the lungs daily. 1 each 11  . Vitamin D, Ergocalciferol, (DRISDOL) 50000 units CAPS capsule TAKE ONE CAPSULE BY MOUTH ONCE A WEEK 12 capsule 0  . cephALEXin (KEFLEX) 500 MG capsule Take 1 capsule by mouth 2 (two) times daily.    [name] albuterol (PROVENTIL HFA;VENTOLIN HFA) 108 (90 BASE) MCG/ACT inhaler Inhale 2 puffs into the lungs every 6 (six) hours as needed for wheezing. 1 Inhaler 5   No facility-administered medications prior to visit.     ROS Review of Systems  Constitutional: Positive for fatigue. Negative for activity change, appetite change, chills and unexpected weight change.  HENT: Negative for congestion, mouth sores and sinus pressure.   Eyes: Negative for visual disturbance.  Respiratory: Negative for cough and chest tightness.   Gastrointestinal: Negative for abdominal pain and nausea.  Genitourinary: Negative for difficulty urinating, frequency and vaginal pain.  Musculoskeletal: Positive for arthralgias, back pain and gait problem.  Skin: Negative for pallor and rash.  Neurological: Negative for dizziness, tremors, weakness, numbness and headaches.  Psychiatric/Behavioral: Positive for sleep disturbance. Negative for confusion. The patient is nervous/anxious.     Objective:  BP (!) 110/50   Pulse 80   Wt 160 lb (72.6 kg)   SpO2 92%   BMI 26.63 kg/m   BP Readings from Last 3 Encounters:  07/04/16 (!) 110/50  05/02/16 (!) 150/62  04/25/16 130/60    Wt Readings from Last 3 Encounters:  07/04/16 160 lb (72.6 kg)  05/02/16 157 lb (71.2 kg)  04/25/16 157 lb (71.2 kg)    Physical Exam  Constitutional: She appears well-developed. No distress.  HENT:  Head: Normocephalic.  Right Ear: External ear normal.  Left Ear: External ear normal.  Nose: Nose normal.  Mouth/Throat: Oropharynx is clear and moist.  Eyes: Conjunctivae are normal. Pupils are equal, round, and reactive to light. Right eye exhibits no discharge. Left eye exhibits no discharge.  Neck:  Normal range of motion. Neck supple. No JVD present. No tracheal deviation present. No thyromegaly present.  Cardiovascular: Normal rate, regular rhythm and normal heart sounds.   Pulmonary/Chest: No stridor. No respiratory distress. She has no wheezes.  Abdominal: Soft. Bowel sounds are normal. She exhibits no distension and no mass. There is no tenderness. There is no rebound and no guarding.  Musculoskeletal: She exhibits tenderness. She exhibits no edema.  Lymphadenopathy:    She has no cervical adenopathy.  Neurological: She displays normal reflexes. No cranial nerve deficit. She exhibits normal muscle tone. Coordination abnormal.  Skin: No rash noted. No erythema.  Psychiatric: She has a normal mood and affect. Her behavior is normal. Judgment and thought content normal.  in a w/c  Lab Results  Component Value Date   WBC 7.8 07/14/2015   HGB 12.0 07/14/2015   HCT 36.1 07/14/2015   PLT 278.0 07/14/2015   GLUCOSE 82 07/14/2015   CHOL 310 (H) 07/14/2015   TRIG 300.0 (H) 07/14/2015   HDL 38.00 (L) 07/14/2015   LDLDIRECT 187.0 07/14/2015   LDLCALC (H) 05/12/2009    240        Total Cholesterol/HDL:CHD Risk Coronary Heart Disease Risk Table                     Men   Women  1/2 Average Risk   3.4   3.3  Average Risk       5.0   4.4  2 X Average Risk   9.6   7.1  3 X Average Risk  23.4   11.0        Use the calculated Patient Ratio above and the CHD Risk Table to determine the patient's CHD Risk.        ATP III CLASSIFICATION (LDL):  <100     mg/dL   Optimal  100-129  mg/dL   Near or Above                    Optimal  130-159  mg/dL   Borderline  160-189  mg/dL   High  >190     mg/dL   Very High   ALT 6 07/14/2015   AST 9 07/14/2015   NA 141 07/14/2015   K 4.3 07/14/2015   CL 103 07/14/2015   CREATININE 0.89 07/14/2015   BUN 10 07/14/2015   CO2 33 (H) 07/14/2015   TSH 0.46 07/14/2015   INR 1.0 09/16/2008   HGBA1C  09/17/2008    5.0 (NOTE)   The ADA recommends the  following therapeutic goal for glycemic   control related to Hgb A1C measurement:   Goal of Therapy:   < 7.0% Hgb A1C   Reference: American Diabetes Association: Clinical Practice   Recommendations 2008, Diabetes Care,  2008, 31:(Suppl 1).    Nm Bone Scan 3 Phase  Result Date: 03/07/2016 CLINICAL DATA:  Chronic left  knee pain. Evaluate for stress fracture. Left knee gives out and aches. Unable to bear weight. Fall 2 weeks ago with trauma to the left knee. No history of surgery. EXAM: NUCLEAR MEDICINE 3-PHASE BONE SCAN TECHNIQUE: Radionuclide angiographic images, immediate static blood pool images, and 3-hour delayed static images were obtained of the knees after intravenous injection of radiopharmaceutical. RADIOPHARMACEUTICALS:  21 mCi Tc-56mMDP COMPARISON:  None. FINDINGS: Vascular phase: Normal Blood pool phase: Essentially normal. There may be slight increased uptake at the midline on the left at the level of the patella. Delayed phase: There is focal uptake at the midline on the left on the AP view, at the level of the patella. Otherwise, the uptake in the knees is relatively symmetric. There is mild uptake at the junction of the proximal fibulas and tibias bilaterally, slightly greater on the left than the right. There is mild increased uptake at the tibial tuberosity which is slightly greater on the right than the left. Mild uptake along the posterior aspect of the distal femurs slightly greater on the right than the left. There is no focal uptake to suggest fracture. IMPRESSION: 1. No 3 phase uptake to suggest fracture or acute stress change. 2. Mild uptake in both knees is likely degenerative. Electronically Signed   By: DDorise BullionIII M.D   On: 03/07/2016 14:34    Assessment & Plan:   There are no diagnoses linked to this encounter. I have discontinued Ms. Tith's cephALEXin. I am also having her maintain her NON FORMULARY, albuterol, promethazine, (DiphenhydrAMINE HCl, Sleep, (ZZZQUIL  PO)), albuterol, pantoprazole, gabapentin, ketoconazole, SUMAtriptan, linaclotide, phenytoin, umeclidinium-vilanterol, levothyroxine, PARoxetine, traZODone, cycloSPORINE, SSD, HYDROcodone-acetaminophen, and Vitamin D (Ergocalciferol).  No orders of the defined types were placed in this encounter.    Follow-up: No Follow-up on file.  AWalker Kehr MD

## 2016-07-04 NOTE — Addendum Note (Signed)
Addended by: Cresenciano Lick on: 07/04/2016 05:00 PM   Modules accepted: Orders

## 2016-07-04 NOTE — Assessment & Plan Note (Signed)
Vit D 

## 2016-07-04 NOTE — Assessment & Plan Note (Signed)
NAS diet 

## 2016-07-04 NOTE — Assessment & Plan Note (Signed)
Linzess 

## 2016-07-04 NOTE — Assessment & Plan Note (Signed)
Trying to quit Down to 4 cigs a day

## 2016-07-04 NOTE — Assessment & Plan Note (Signed)
Norco  Potential benefits of a long term opioids use as well as potential risks (i.e. addiction risk, apnea etc) and complications (i.e. Somnolence, constipation and others) were explained to the patient and were aknowledged. 

## 2016-08-07 DIAGNOSIS — M1712 Unilateral primary osteoarthritis, left knee: Secondary | ICD-10-CM | POA: Diagnosis not present

## 2016-08-07 DIAGNOSIS — G8929 Other chronic pain: Secondary | ICD-10-CM | POA: Diagnosis not present

## 2016-08-07 DIAGNOSIS — M25562 Pain in left knee: Secondary | ICD-10-CM | POA: Diagnosis not present

## 2016-08-15 DIAGNOSIS — G8929 Other chronic pain: Secondary | ICD-10-CM | POA: Diagnosis not present

## 2016-08-15 DIAGNOSIS — M25562 Pain in left knee: Secondary | ICD-10-CM | POA: Diagnosis not present

## 2016-08-23 DIAGNOSIS — G8929 Other chronic pain: Secondary | ICD-10-CM | POA: Diagnosis not present

## 2016-08-23 DIAGNOSIS — M25562 Pain in left knee: Secondary | ICD-10-CM | POA: Diagnosis not present

## 2016-08-23 DIAGNOSIS — M1712 Unilateral primary osteoarthritis, left knee: Secondary | ICD-10-CM | POA: Diagnosis not present

## 2016-08-24 DIAGNOSIS — N907 Vulvar cyst: Secondary | ICD-10-CM | POA: Diagnosis not present

## 2016-08-31 DIAGNOSIS — M1712 Unilateral primary osteoarthritis, left knee: Secondary | ICD-10-CM | POA: Diagnosis not present

## 2016-08-31 DIAGNOSIS — M25562 Pain in left knee: Secondary | ICD-10-CM | POA: Diagnosis not present

## 2016-08-31 DIAGNOSIS — G8929 Other chronic pain: Secondary | ICD-10-CM | POA: Diagnosis not present

## 2016-09-07 DIAGNOSIS — G8929 Other chronic pain: Secondary | ICD-10-CM | POA: Diagnosis not present

## 2016-09-07 DIAGNOSIS — M1712 Unilateral primary osteoarthritis, left knee: Secondary | ICD-10-CM | POA: Diagnosis not present

## 2016-09-07 DIAGNOSIS — M25562 Pain in left knee: Secondary | ICD-10-CM | POA: Diagnosis not present

## 2016-09-12 DIAGNOSIS — H2513 Age-related nuclear cataract, bilateral: Secondary | ICD-10-CM | POA: Diagnosis not present

## 2016-09-12 DIAGNOSIS — H52223 Regular astigmatism, bilateral: Secondary | ICD-10-CM | POA: Diagnosis not present

## 2016-09-12 DIAGNOSIS — I10 Essential (primary) hypertension: Secondary | ICD-10-CM | POA: Diagnosis not present

## 2016-09-12 DIAGNOSIS — H5201 Hypermetropia, right eye: Secondary | ICD-10-CM | POA: Diagnosis not present

## 2016-09-12 DIAGNOSIS — H11153 Pinguecula, bilateral: Secondary | ICD-10-CM | POA: Diagnosis not present

## 2016-09-12 DIAGNOSIS — H18413 Arcus senilis, bilateral: Secondary | ICD-10-CM | POA: Diagnosis not present

## 2016-09-12 DIAGNOSIS — H11423 Conjunctival edema, bilateral: Secondary | ICD-10-CM | POA: Diagnosis not present

## 2016-09-12 DIAGNOSIS — H35039 Hypertensive retinopathy, unspecified eye: Secondary | ICD-10-CM | POA: Diagnosis not present

## 2016-09-12 DIAGNOSIS — H04123 Dry eye syndrome of bilateral lacrimal glands: Secondary | ICD-10-CM | POA: Diagnosis not present

## 2016-09-21 DIAGNOSIS — Z596 Low income: Secondary | ICD-10-CM | POA: Diagnosis not present

## 2016-09-21 DIAGNOSIS — H04129 Dry eye syndrome of unspecified lacrimal gland: Secondary | ICD-10-CM | POA: Diagnosis not present

## 2016-09-21 DIAGNOSIS — Z85118 Personal history of other malignant neoplasm of bronchus and lung: Secondary | ICD-10-CM | POA: Diagnosis not present

## 2016-09-21 DIAGNOSIS — Z0000 Encounter for general adult medical examination without abnormal findings: Secondary | ICD-10-CM | POA: Diagnosis not present

## 2016-09-21 DIAGNOSIS — G43019 Migraine without aura, intractable, without status migrainosus: Secondary | ICD-10-CM | POA: Diagnosis not present

## 2016-09-21 DIAGNOSIS — M130 Polyarthritis, unspecified: Secondary | ICD-10-CM | POA: Diagnosis not present

## 2016-09-21 DIAGNOSIS — Z79891 Long term (current) use of opiate analgesic: Secondary | ICD-10-CM | POA: Diagnosis not present

## 2016-09-21 DIAGNOSIS — G4700 Insomnia, unspecified: Secondary | ICD-10-CM | POA: Diagnosis not present

## 2016-09-21 DIAGNOSIS — R110 Nausea: Secondary | ICD-10-CM | POA: Diagnosis not present

## 2016-09-21 DIAGNOSIS — G4089 Other seizures: Secondary | ICD-10-CM | POA: Diagnosis not present

## 2016-09-21 DIAGNOSIS — R69 Illness, unspecified: Secondary | ICD-10-CM | POA: Diagnosis not present

## 2016-09-21 DIAGNOSIS — K529 Noninfective gastroenteritis and colitis, unspecified: Secondary | ICD-10-CM | POA: Diagnosis not present

## 2016-09-21 DIAGNOSIS — Z9181 History of falling: Secondary | ICD-10-CM | POA: Diagnosis not present

## 2016-09-21 DIAGNOSIS — E039 Hypothyroidism, unspecified: Secondary | ICD-10-CM | POA: Diagnosis not present

## 2016-09-21 DIAGNOSIS — H2589 Other age-related cataract: Secondary | ICD-10-CM | POA: Diagnosis not present

## 2016-09-21 DIAGNOSIS — Z6827 Body mass index (BMI) 27.0-27.9, adult: Secondary | ICD-10-CM | POA: Diagnosis not present

## 2016-10-03 ENCOUNTER — Other Ambulatory Visit (INDEPENDENT_AMBULATORY_CARE_PROVIDER_SITE_OTHER): Payer: Medicare HMO

## 2016-10-03 ENCOUNTER — Encounter: Payer: Self-pay | Admitting: Internal Medicine

## 2016-10-03 ENCOUNTER — Ambulatory Visit (INDEPENDENT_AMBULATORY_CARE_PROVIDER_SITE_OTHER)
Admission: RE | Admit: 2016-10-03 | Discharge: 2016-10-03 | Disposition: A | Discharge status: Home / Self Care | Payer: Medicare HMO | Source: Ambulatory Visit | Attending: Internal Medicine | Admitting: Internal Medicine

## 2016-10-03 ENCOUNTER — Ambulatory Visit (INDEPENDENT_AMBULATORY_CARE_PROVIDER_SITE_OTHER): Payer: Medicare HMO | Admitting: Internal Medicine

## 2016-10-03 ENCOUNTER — Other Ambulatory Visit: Payer: Self-pay | Admitting: Internal Medicine

## 2016-10-03 VITALS — BP 115/64 | HR 63 | Temp 98.5°F | Ht 65.0 in | Wt 163.0 lb

## 2016-10-03 DIAGNOSIS — G8929 Other chronic pain: Secondary | ICD-10-CM | POA: Diagnosis not present

## 2016-10-03 DIAGNOSIS — R0902 Hypoxemia: Secondary | ICD-10-CM | POA: Diagnosis not present

## 2016-10-03 DIAGNOSIS — K219 Gastro-esophageal reflux disease without esophagitis: Secondary | ICD-10-CM | POA: Diagnosis not present

## 2016-10-03 DIAGNOSIS — K439 Ventral hernia without obstruction or gangrene: Secondary | ICD-10-CM | POA: Insufficient documentation

## 2016-10-03 DIAGNOSIS — R079 Chest pain, unspecified: Secondary | ICD-10-CM

## 2016-10-03 DIAGNOSIS — J441 Chronic obstructive pulmonary disease with (acute) exacerbation: Secondary | ICD-10-CM | POA: Diagnosis not present

## 2016-10-03 DIAGNOSIS — M5440 Lumbago with sciatica, unspecified side: Secondary | ICD-10-CM | POA: Diagnosis not present

## 2016-10-03 DIAGNOSIS — R05 Cough: Secondary | ICD-10-CM | POA: Diagnosis not present

## 2016-10-03 LAB — CBC WITH DIFFERENTIAL/PLATELET
Basophils Absolute: 0.1 10*3/uL (ref 0.0–0.1)
Basophils Relative: 1.2 % (ref 0.0–3.0)
Eosinophils Absolute: 0.2 10*3/uL (ref 0.0–0.7)
Eosinophils Relative: 2.2 % (ref 0.0–5.0)
HCT: 39.7 % (ref 36.0–46.0)
Hemoglobin: 13.3 g/dL (ref 12.0–15.0)
Lymphocytes Relative: 19 % (ref 12.0–46.0)
Lymphs Abs: 1.6 10*3/uL (ref 0.7–4.0)
MCHC: 33.5 g/dL (ref 30.0–36.0)
MCV: 99 fl (ref 78.0–100.0)
Monocytes Absolute: 0.7 10*3/uL (ref 0.1–1.0)
Monocytes Relative: 7.9 % (ref 3.0–12.0)
Neutro Abs: 5.8 10*3/uL (ref 1.4–7.7)
Neutrophils Relative %: 69.7 % (ref 43.0–77.0)
Platelets: 295 10*3/uL (ref 150.0–400.0)
RBC: 4.01 Mil/uL (ref 3.87–5.11)
RDW: 14 % (ref 11.5–15.5)
WBC: 8.4 10*3/uL (ref 4.0–10.5)

## 2016-10-03 LAB — SEDIMENTATION RATE: Sed Rate: 21 mm/hr (ref 0–30)

## 2016-10-03 LAB — BASIC METABOLIC PANEL
BUN: 11 mg/dL (ref 6–23)
CO2: 31 mEq/L (ref 19–32)
Calcium: 9.6 mg/dL (ref 8.4–10.5)
Chloride: 103 mEq/L (ref 96–112)
Creatinine, Ser: 0.98 mg/dL (ref 0.40–1.20)
GFR: 60.96 mL/min (ref >60.00)
Glucose, Bld: 88 mg/dL (ref 70–99)
Potassium: 4.7 mEq/L (ref 3.5–5.1)
Sodium: 138 mEq/L (ref 135–145)

## 2016-10-03 LAB — HEPATIC FUNCTION PANEL
ALT: 11 U/L (ref 0–35)
AST: 13 U/L (ref 0–37)
Albumin: 3.9 g/dL (ref 3.5–5.2)
Alkaline Phosphatase: 126 U/L — ABNORMAL HIGH (ref 39–117)
Bilirubin, Direct: 0.1 mg/dL (ref 0.0–0.3)
Total Bilirubin: 0.3 mg/dL (ref 0.2–1.2)
Total Protein: 6.9 g/dL (ref 6.0–8.3)

## 2016-10-03 LAB — LIPASE: Lipase: 31 U/L (ref 11.0–59.0)

## 2016-10-03 MED ORDER — HYDROCODONE-ACETAMINOPHEN 10-325 MG PO TABS: 1.0000 | ORAL_TABLET | Freq: Four times a day (QID) | ORAL | 0 refills | Status: DC | PRN

## 2016-10-03 MED ORDER — PANTOPRAZOLE SODIUM 40 MG PO TBEC: 40.0000 mg | DELAYED_RELEASE_TABLET | Freq: Every day | ORAL | 3 refills | Status: DC

## 2016-10-03 NOTE — Assessment & Plan Note (Signed)
Norco  Potential benefits of a long term opioids use as well as potential risks (i.e. addiction risk, apnea etc) and complications (i.e. Somnolence, constipation and others) were explained to the patient and were aknowledged. 

## 2016-10-03 NOTE — Patient Instructions (Signed)
Come back in 1-2 weeks if not better

## 2016-10-03 NOTE — Assessment & Plan Note (Signed)
Epig midline 4/18 CT abd if not better Surg ref if needed Labs

## 2016-10-03 NOTE — Assessment & Plan Note (Signed)
Re-start Protonix Risks associated with treatment noncompliance were discussed. Compliance was encouraged.

## 2016-10-03 NOTE — Progress Notes (Signed)
Subjective:  Patient ID: Kaylee Hansen, female    DOB: 07-24-1955  Age: 60 y.o. MRN: [id number]  CC: No chief complaint on file.   HPI Kaylee Hansen presents for LBP, COPD, fatigue. C/o SOB w/activities. Asking for a portable O2 C/o CP x days; under L breast - having it now; irrad to R chest. C/o n/v She is a poor historian.Marland KitchenMarland KitchenShe stopped Protonix for ??reason  Outpatient Medications Prior to Visit  Medication Sig Dispense Refill  . albuterol (PROVENTIL) (2.5 MG/3ML) 0.083% nebulizer solution Take 3 mLs (2.5 mg total) by nebulization 4 (four) times daily. 120 vial 11  . cycloSPORINE (RESTASIS) 0.05 % ophthalmic emulsion Place 1 drop into both eyes 2 (two) times daily. 0.4 mL 2  . DiphenhydrAMINE HCl, Sleep, (ZZZQUIL PO) Take 1 tablet by mouth at bedtime.    . gabapentin (NEURONTIN) 100 MG capsule Take 1 capsule by mouth 3 (three) times daily.    [name] HYDROcodone-acetaminophen (NORCO) 10-325 MG tablet Take 1 tablet by mouth every 6 (six) hours as needed for moderate pain or severe pain. Please fill on or after 09/17/16 120 tablet 0  . ketoconazole (NIZORAL) 2 % cream as needed.    [name] levothyroxine (SYNTHROID, LEVOTHROID) 150 MCG tablet Take 1 tablet (150 mcg total) by mouth daily. 90 tablet 3  . linaclotide (LINZESS) 290 MCG CAPS capsule Take 1 capsule (290 mcg total) by mouth daily before breakfast. 30 capsule 11  . NON FORMULARY Oxygen- use at night as directed     . pantoprazole (PROTONIX) 40 MG tablet Take 1 tablet (40 mg total) by mouth daily. 90 tablet 3  . PARoxetine (PAXIL) 40 MG tablet TAKE 1 TABLET (40 MG TOTAL) BY MOUTH DAILY. 90 tablet 3  . phenytoin (DILANTIN) 100 MG ER capsule 100 mg every morning and 200 mg every night. 270 capsule 3  . promethazine (PHENERGAN) 25 MG tablet Take 1 tablet (25 mg total) by mouth daily. 90 tablet 1  . SSD 1 % cream Apply topically daily. Apply to burns 50 g 1  . SUMAtriptan (IMITREX) 100 MG tablet as needed.    . traZODone (DESYREL) 100 MG tablet  Take 1 tablet (100 mg total) by mouth at bedtime. 90 tablet 3  . umeclidinium-vilanterol (ANORO ELLIPTA) 62.5-25 MCG/INH AEPB Inhale 1 puff into the lungs daily. 1 each 11  . Vitamin D, Ergocalciferol, (DRISDOL) 50000 units CAPS capsule Take 1 capsule (50,000 Units total) by mouth once a week. 12 capsule 3  . albuterol (PROVENTIL HFA;VENTOLIN HFA) 108 (90 BASE) MCG/ACT inhaler Inhale 2 puffs into the lungs every 6 (six) hours as needed for wheezing. 1 Inhaler 5   No facility-administered medications prior to visit.     ROS Review of Systems  Constitutional: Negative for activity change, appetite change, chills, fatigue and unexpected weight change.  HENT: Negative for congestion, mouth sores and sinus pressure.   Eyes: Negative for visual disturbance.  Respiratory: Positive for shortness of breath. Negative for cough and chest tightness.   Gastrointestinal: Negative for abdominal pain and nausea.  Genitourinary: Negative for difficulty urinating, frequency and vaginal pain.  Musculoskeletal: Positive for arthralgias, back pain and gait problem.  Skin: Negative for pallor and rash.  Neurological: Negative for dizziness, tremors, weakness, numbness and headaches.  Psychiatric/Behavioral: Positive for sleep disturbance. Negative for confusion and suicidal ideas. The patient is nervous/anxious.     Objective:  BP 115/64 (BP Location: Left Arm, Patient Position: Sitting, Cuff Size: Normal)   Pulse 63  Temp 98.5 F (36.9 C) (Oral)   Ht '5\' 5"'$  (1.651 m)   Wt 163 lb (73.9 kg)   SpO2 97%   BMI 27.12 kg/m   BP Readings from Last 3 Encounters:  10/03/16 115/64  07/04/16 (!) 110/50  05/02/16 (!) 150/62    Wt Readings from Last 3 Encounters:  10/03/16 163 lb (73.9 kg)  07/04/16 160 lb (72.6 kg)  05/02/16 157 lb (71.2 kg)    Physical Exam  Constitutional: She appears well-developed. No distress.  HENT:  Head: Normocephalic.  Right Ear: External ear normal.  Left Ear: External ear  normal.  Nose: Nose normal.  Mouth/Throat: Oropharynx is clear and moist.  Eyes: Conjunctivae are normal. Pupils are equal, round, and reactive to light. Right eye exhibits no discharge. Left eye exhibits no discharge.  Neck: Normal range of motion. Neck supple. No JVD present. No tracheal deviation present. No thyromegaly present.  Cardiovascular: Normal rate, regular rhythm and normal heart sounds.   Pulmonary/Chest: No stridor. No respiratory distress. She has wheezes.  Abdominal: Soft. Bowel sounds are normal. She exhibits no distension and no mass. There is no tenderness. There is no rebound and no guarding.  Musculoskeletal: She exhibits no edema or tenderness.  Lymphadenopathy:    She has no cervical adenopathy.  Neurological: No cranial nerve deficit. She exhibits normal muscle tone. Coordination abnormal.  Skin: No rash noted. No erythema.  Psychiatric: She has a normal mood and affect. Her behavior is normal. Judgment and thought content normal.  in a w/c Chest wall under L breast and epig area are tender to palpation Midline epig hernia - sensitive Procedure: EKG Indication: chest pain Impression: NSR. LVH. No acute changes.   A complex case, FTF>30 min discussing CP, compliance; smoking less  Lab Results  Component Value Date   WBC 7.8 07/14/2015   HGB 12.0 07/14/2015   HCT 36.1 07/14/2015   PLT 278.0 07/14/2015   GLUCOSE 82 07/14/2015   CHOL 310 (H) 07/14/2015   TRIG 300.0 (H) 07/14/2015   HDL 38.00 (L) 07/14/2015   LDLDIRECT 187.0 07/14/2015   LDLCALC (H) 05/12/2009    240        Total Cholesterol/HDL:CHD Risk Coronary Heart Disease Risk Table                     Men   Women  1/2 Average Risk   3.4   3.3  Average Risk       5.0   4.4  2 X Average Risk   9.6   7.1  3 X Average Risk  23.4   11.0        Use the calculated Patient Ratio above and the CHD Risk Table to determine the patient's CHD Risk.        ATP III CLASSIFICATION (LDL):  <100     mg/dL    Optimal  100-129  mg/dL   Near or Above                    Optimal  130-159  mg/dL   Borderline  160-189  mg/dL   High  >190     mg/dL   Very High   ALT 6 07/14/2015   AST 9 07/14/2015   NA 141 07/14/2015   K 4.3 07/14/2015   CL 103 07/14/2015   CREATININE 0.89 07/14/2015   BUN 10 07/14/2015   CO2 33 (H) 07/14/2015   TSH 0.46 07/14/2015   INR 1.0  09/16/2008   HGBA1C  09/17/2008    5.0 (NOTE)   The ADA recommends the following therapeutic goal for glycemic   control related to Hgb A1C measurement:   Goal of Therapy:   < 7.0% Hgb A1C   Reference: American Diabetes Association: Clinical Practice   Recommendations 2008, Diabetes Care,  2008, 31:(Suppl 1).    Nm Bone Scan 3 Phase  Result Date: 03/07/2016 CLINICAL DATA:  Chronic left knee pain. Evaluate for stress fracture. Left knee gives out and aches. Unable to bear weight. Fall 2 weeks ago with trauma to the left knee. No history of surgery. EXAM: NUCLEAR MEDICINE 3-PHASE BONE SCAN TECHNIQUE: Radionuclide angiographic images, immediate static blood pool images, and 3-hour delayed static images were obtained of the knees after intravenous injection of radiopharmaceutical. RADIOPHARMACEUTICALS:  21 mCi Tc-78mMDP COMPARISON:  None. FINDINGS: Vascular phase: Normal Blood pool phase: Essentially normal. There may be slight increased uptake at the midline on the left at the level of the patella. Delayed phase: There is focal uptake at the midline on the left on the AP view, at the level of the patella. Otherwise, the uptake in the knees is relatively symmetric. There is mild uptake at the junction of the proximal fibulas and tibias bilaterally, slightly greater on the left than the right. There is mild increased uptake at the tibial tuberosity which is slightly greater on the right than the left. Mild uptake along the posterior aspect of the distal femurs slightly greater on the right than the left. There is no focal uptake to suggest fracture.  IMPRESSION: 1. No 3 phase uptake to suggest fracture or acute stress change. 2. Mild uptake in both knees is likely degenerative. Electronically Signed   By: DDorise BullionIII M.D   On: 03/07/2016 14:34    Assessment & Plan:   There are no diagnoses linked to this encounter. I am having Ms. Lipschutz maintain her NON FORMULARY, albuterol, promethazine, (DiphenhydrAMINE HCl, Sleep, (ZZZQUIL PO)), albuterol, pantoprazole, gabapentin, ketoconazole, SUMAtriptan, linaclotide, phenytoin, umeclidinium-vilanterol, levothyroxine, PARoxetine, traZODone, cycloSPORINE, SSD, Vitamin D (Ergocalciferol), HYDROcodone-acetaminophen, and ALPRAZolam.  Meds ordered this encounter  Medications  . ALPRAZolam (XANAX) 0.25 MG tablet    Sig: Take 0.25 mg by mouth at bedtime as needed for anxiety.     Follow-up: No Follow-up on file.  AWalker Kehr MD

## 2016-10-03 NOTE — Assessment & Plan Note (Addendum)
?  etiology EKG CXR CT abd if not better Re-start Protonix

## 2016-10-03 NOTE — Progress Notes (Signed)
Pre visit review using our clinic review tool, if applicable. No additional management support is needed unless otherwise documented below in the visit note. 

## 2016-10-04 ENCOUNTER — Telehealth: Payer: Self-pay | Admitting: Internal Medicine

## 2016-10-04 NOTE — Telephone Encounter (Signed)
Called in stating that Kaylee Hansen was not able to provide patient with a portable oxygen concentrator due to insurance not covering.  States currently provides patient with in home oxygen.  States patient is going to try with other companies to find cost and coverage.

## 2016-10-10 DIAGNOSIS — R69 Illness, unspecified: Secondary | ICD-10-CM | POA: Diagnosis not present

## 2016-10-10 DIAGNOSIS — Z01419 Encounter for gynecological examination (general) (routine) without abnormal findings: Secondary | ICD-10-CM | POA: Diagnosis not present

## 2016-10-18 DIAGNOSIS — M1712 Unilateral primary osteoarthritis, left knee: Secondary | ICD-10-CM | POA: Diagnosis not present

## 2016-10-18 DIAGNOSIS — M25562 Pain in left knee: Secondary | ICD-10-CM | POA: Diagnosis not present

## 2016-10-25 ENCOUNTER — Other Ambulatory Visit: Payer: Self-pay | Admitting: Internal Medicine

## 2016-10-30 ENCOUNTER — Other Ambulatory Visit (INDEPENDENT_AMBULATORY_CARE_PROVIDER_SITE_OTHER): Payer: Medicare HMO

## 2016-10-30 ENCOUNTER — Encounter: Payer: Self-pay | Admitting: Internal Medicine

## 2016-10-30 ENCOUNTER — Ambulatory Visit (INDEPENDENT_AMBULATORY_CARE_PROVIDER_SITE_OTHER): Payer: Medicare HMO | Admitting: Internal Medicine

## 2016-10-30 DIAGNOSIS — N390 Urinary tract infection, site not specified: Secondary | ICD-10-CM

## 2016-10-30 DIAGNOSIS — F515 Nightmare disorder: Secondary | ICD-10-CM | POA: Insufficient documentation

## 2016-10-30 DIAGNOSIS — E034 Atrophy of thyroid (acquired): Secondary | ICD-10-CM

## 2016-10-30 DIAGNOSIS — R69 Illness, unspecified: Secondary | ICD-10-CM | POA: Diagnosis not present

## 2016-10-30 LAB — URINALYSIS, ROUTINE W REFLEX MICROSCOPIC
Nitrite: NEGATIVE
Specific Gravity, Urine: 1.025 (ref 1.000–1.030)
Total Protein, Urine: 30 — AB
Urine Glucose: NEGATIVE
Urobilinogen, UA: 0.2 (ref 0.0–1.0)
pH: 6 (ref 5.0–8.0)

## 2016-10-30 MED ORDER — CEFUROXIME AXETIL 250 MG PO TABS: 250.0000 mg | ORAL_TABLET | Freq: Two times a day (BID) | ORAL | 0 refills | Status: AC

## 2016-10-30 MED ORDER — PAROXETINE HCL 40 MG PO TABS: ORAL_TABLET | ORAL | 3 refills | Status: DC

## 2016-10-30 NOTE — Progress Notes (Signed)
Subjective:  Patient ID: Kaylee Hansen, female    DOB: 1955-09-08  Age: 61 y.o. MRN: [id number]  CC: No chief complaint on file.   HPI Lovene L Nuckles presents for urinary sx's x 2 wks C/o bad dreams She can take Keflex ok  Outpatient Medications Prior to Visit  Medication Sig Dispense Refill  . albuterol (PROVENTIL) (2.5 MG/3ML) 0.083% nebulizer solution Take 3 mLs (2.5 mg total) by nebulization 4 (four) times daily. 120 vial 11  . ALPRAZolam (XANAX) 0.25 MG tablet Take 0.25 mg by mouth at bedtime as needed for anxiety.    . cycloSPORINE (RESTASIS) 0.05 % ophthalmic emulsion Place 1 drop into both eyes 2 (two) times daily. 0.4 mL 2  . DiphenhydrAMINE HCl, Sleep, (ZZZQUIL PO) Take 1 tablet by mouth at bedtime.    . gabapentin (NEURONTIN) 100 MG capsule Take 1 capsule by mouth 3 (three) times daily.    [name] HYDROcodone-acetaminophen (NORCO) 10-325 MG tablet Take 1 tablet by mouth every 6 (six) hours as needed for moderate pain or severe pain. Please fill on or after 12/18/16 120 tablet 0  . ketoconazole (NIZORAL) 2 % cream as needed.    [name] levothyroxine (SYNTHROID, LEVOTHROID) 150 MCG tablet Take 1 tablet (150 mcg total) by mouth daily. 90 tablet 3  . LINZESS 290 MCG CAPS capsule TAKE ONE CAPSULE BY MOUTH BEFORE BREAKFAST 90 capsule 1  . NON FORMULARY Oxygen- use at night as directed     . pantoprazole (PROTONIX) 40 MG tablet Take 1 tablet (40 mg total) by mouth daily. 90 tablet 3  . PARoxetine (PAXIL) 40 MG tablet TAKE 1 TABLET (40 MG TOTAL) BY MOUTH DAILY. 90 tablet 3  . phenytoin (DILANTIN) 100 MG ER capsule 100 mg every morning and 200 mg every night. 270 capsule 3  . promethazine (PHENERGAN) 25 MG tablet Take 1 tablet (25 mg total) by mouth daily. 90 tablet 1  . SSD 1 % cream Apply topically daily. Apply to burns 50 g 1  . SUMAtriptan (IMITREX) 100 MG tablet TAKE ONE TABLET BY MOUTH EVERY 2 HOURS AS NEEDED FOR  MIGRAINE  OR  HEADACHE.  MAY  REPEAT  IN  2  HOURS  IF  HEADACHE  PERSISTS   OR  RECUR 9 tablet 2  . traZODone (DESYREL) 100 MG tablet Take 1 tablet (100 mg total) by mouth at bedtime. 90 tablet 3  . umeclidinium-vilanterol (ANORO ELLIPTA) 62.5-25 MCG/INH AEPB Inhale 1 puff into the lungs daily. 1 each 11  . Vitamin D, Ergocalciferol, (DRISDOL) 50000 units CAPS capsule Take 1 capsule (50,000 Units total) by mouth once a week. 12 capsule 3  . albuterol (PROVENTIL HFA;VENTOLIN HFA) 108 (90 BASE) MCG/ACT inhaler Inhale 2 puffs into the lungs every 6 (six) hours as needed for wheezing. 1 Inhaler 5   No facility-administered medications prior to visit.     ROS Review of Systems  Constitutional: Positive for chills. Negative for activity change, appetite change, fatigue and unexpected weight change.  HENT: Negative for congestion, mouth sores and sinus pressure.   Eyes: Negative for visual disturbance.  Respiratory: Negative for cough and chest tightness.   Gastrointestinal: Negative for abdominal pain and nausea.  Genitourinary: Positive for dysuria, enuresis and flank pain. Negative for difficulty urinating, frequency and vaginal pain.  Musculoskeletal: Positive for back pain. Negative for gait problem.  Skin: Negative for pallor and rash.  Neurological: Negative for dizziness, tremors, weakness, numbness and headaches.  Psychiatric/Behavioral: Negative for confusion and sleep disturbance.  Objective:  BP 126/68 (BP Location: Left Arm, Patient Position: Sitting, Cuff Size: Large)   Pulse 86   Temp 98.3 F (36.8 C) (Oral)   Ht '5\' 5"'$  (1.651 m)   Wt 164 lb 1.3 oz (74.4 kg)   SpO2 98%   BMI 27.30 kg/m   BP Readings from Last 3 Encounters:  10/30/16 126/68  10/03/16 115/64  07/04/16 (!) 110/50    Wt Readings from Last 3 Encounters:  10/30/16 164 lb 1.3 oz (74.4 kg)  10/03/16 163 lb (73.9 kg)  07/04/16 160 lb (72.6 kg)    Physical Exam  Constitutional: She appears well-developed. No distress.  HENT:  Head: Normocephalic.  Right Ear: External ear  normal.  Left Ear: External ear normal.  Nose: Nose normal.  Mouth/Throat: Oropharynx is clear and moist.  Eyes: Conjunctivae are normal. Pupils are equal, round, and reactive to light. Right eye exhibits no discharge. Left eye exhibits no discharge.  Neck: Normal range of motion. Neck supple. No JVD present. No tracheal deviation present. No thyromegaly present.  Cardiovascular: Normal rate, regular rhythm and normal heart sounds.   Pulmonary/Chest: No stridor. No respiratory distress. She has no wheezes.  Abdominal: Soft. Bowel sounds are normal. She exhibits no distension and no mass. There is no tenderness. There is no rebound and no guarding.  Musculoskeletal: She exhibits tenderness. She exhibits no edema.  Lymphadenopathy:    She has no cervical adenopathy.  Neurological: She displays normal reflexes. No cranial nerve deficit. She exhibits normal muscle tone. Coordination abnormal.  Skin: No rash noted. No erythema.  Psychiatric: She has a normal mood and affect. Her behavior is normal. Judgment and thought content normal.  in a w/c  Lab Results  Component Value Date   WBC 8.4 10/03/2016   HGB 13.3 10/03/2016   HCT 39.7 10/03/2016   PLT 295.0 10/03/2016   GLUCOSE 88 10/03/2016   CHOL 310 (H) 07/14/2015   TRIG 300.0 (H) 07/14/2015   HDL 38.00 (L) 07/14/2015   LDLDIRECT 187.0 07/14/2015   LDLCALC (H) 05/12/2009    240        Total Cholesterol/HDL:CHD Risk Coronary Heart Disease Risk Table                     Men   Women  1/2 Average Risk   3.4   3.3  Average Risk       5.0   4.4  2 X Average Risk   9.6   7.1  3 X Average Risk  23.4   11.0        Use the calculated Patient Ratio above and the CHD Risk Table to determine the patient's CHD Risk.        ATP III CLASSIFICATION (LDL):  <100     mg/dL   Optimal  100-129  mg/dL   Near or Above                    Optimal  130-159  mg/dL   Borderline  160-189  mg/dL   High  >190     mg/dL   Very High   ALT 11 10/03/2016    AST 13 10/03/2016   NA 138 10/03/2016   K 4.7 10/03/2016   CL 103 10/03/2016   CREATININE 0.98 10/03/2016   BUN 11 10/03/2016   CO2 31 10/03/2016   TSH 0.46 07/14/2015   INR 1.0 09/16/2008   HGBA1C  09/17/2008    5.0 (NOTE)  The ADA recommends the following therapeutic goal for glycemic   control related to Hgb A1C measurement:   Goal of Therapy:   < 7.0% Hgb A1C   Reference: American Diabetes Association: Clinical Practice   Recommendations 2008, Diabetes Care,  2008, 31:(Suppl 1).    Dg Chest 2 View  Result Date: 10/03/2016 CLINICAL DATA:  Cough, congestion, chest pain over the last 2 weeks, smoking history EXAM: CHEST  2 VIEW COMPARISON:  Chest x-ray of 09/08/2015 FINDINGS: No active infiltrate or effusion is seen. Mediastinal and hilar contours are unremarkable. The heart is within normal limits in size. No acute bony abnormality seen, with multiple bilateral healed rib fractures again noted. IMPRESSION: 1. No active cardiopulmonary disease. 2. Multiple healed bilateral rib fractures Electronically Signed   By: Ivar Drape M.D.   On: 10/03/2016 11:12    Assessment & Plan:   There are no diagnoses linked to this encounter. I am having Ms. Rami maintain her NON FORMULARY, albuterol, promethazine, (DiphenhydrAMINE HCl, Sleep, (ZZZQUIL PO)), albuterol, gabapentin, ketoconazole, phenytoin, umeclidinium-vilanterol, levothyroxine, PARoxetine, traZODone, cycloSPORINE, SSD, Vitamin D (Ergocalciferol), ALPRAZolam, HYDROcodone-acetaminophen, pantoprazole, SUMAtriptan, and LINZESS.  No orders of the defined types were placed in this encounter.    Follow-up: No Follow-up on file.  Walker Kehr, MD

## 2016-10-30 NOTE — Assessment & Plan Note (Signed)
?  Paxil related - will taper off and d/c if needed

## 2016-10-30 NOTE — Progress Notes (Signed)
Pre visit review using our clinic review tool, if applicable. No additional management support is needed unless otherwise documented below in the visit note. 

## 2016-10-30 NOTE — Assessment & Plan Note (Signed)
Ceftin Diflucan prn

## 2016-10-30 NOTE — Assessment & Plan Note (Signed)
On Levothroid 

## 2016-11-01 LAB — URINE CULTURE

## 2016-11-16 ENCOUNTER — Encounter: Payer: Self-pay | Admitting: Family

## 2016-11-16 ENCOUNTER — Ambulatory Visit (INDEPENDENT_AMBULATORY_CARE_PROVIDER_SITE_OTHER): Payer: Medicare HMO | Admitting: Family

## 2016-11-16 ENCOUNTER — Other Ambulatory Visit: Payer: Medicare HMO

## 2016-11-16 VITALS — BP 120/52 | HR 88 | Temp 98.9°F | Resp 18 | Ht 65.0 in | Wt 163.0 lb

## 2016-11-16 DIAGNOSIS — N3001 Acute cystitis with hematuria: Secondary | ICD-10-CM | POA: Diagnosis not present

## 2016-11-16 LAB — POCT URINALYSIS DIPSTICK
Bilirubin, UA: NEGATIVE
Glucose, UA: NEGATIVE
Ketones, UA: NEGATIVE
Nitrite, UA: NEGATIVE
Spec Grav, UA: >=1.03 — AB (ref 1.010–1.025)
Urobilinogen, UA: NEGATIVE E.U./dL — AB
pH, UA: 6 (ref 5.0–8.0)

## 2016-11-16 MED ORDER — FLUCONAZOLE 150 MG PO TABS: 150.0000 mg | ORAL_TABLET | Freq: Once | ORAL | 0 refills | Status: AC

## 2016-11-16 MED ORDER — CIPROFLOXACIN HCL 500 MG PO TABS: 500.0000 mg | ORAL_TABLET | Freq: Two times a day (BID) | ORAL | 0 refills | Status: DC

## 2016-11-16 NOTE — Assessment & Plan Note (Signed)
In office UA positive for leukocytes and blood and negative for nitrites. Symptoms and exam are consistent with acute cystitis with concern for developing pyelonephritis. Start ciprofloxacin. Start fluconazole as needed for post-antibiotic candidiasis. Advised to seek further care if symptoms worsen or do not improve.

## 2016-11-16 NOTE — Progress Notes (Signed)
Subjective:    Patient ID: Kaylee Hansen, female    DOB: [birth year], 61 y.o.   MRN: [id number]  Chief Complaint  Patient presents with  . Urinary Frequency    was seen a couple of weeks ago for UTI, still has urinary freuncy and has left side pain    HPI:  Kaylee Hansen is a 61 y.o. female who  has a past medical history of Anxiety; Collagenous colitis; Colon adenomas (2011); Constipation; COPD (chronic obstructive pulmonary disease) ([place]); Depression; Diverticulosis; Esophageal stricture (11/08/2012); Esophageal ulcer; Esophageal ulcer (04/2009); GERD (gastroesophageal reflux disease); Helicobacter pylori gastritis (2010); History of cholecystectomy; appendectomy; cancer of lung (1999); hysterectomy; Hyperlipidemia; Hypothyroidism; Low back pain; Osteoarthritis of knee; Seizures ([place]); Stroke [facility]); and 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 and presents today for an acute office visit.  Continues to experience the associated symptom of urinary freqeuncy, dysuria and left side discomfort has been going on for a couple weeks. Noted to have a UTI a couple of weeks ago confirmed with urine culture. Was prescribed Ceftin and reports taking the medication as prescribed and denies adverse side effects. Symptoms improved slightly and then worsened. No fevers.   Allergies  Allergen Reactions  . Atorvastatin     Unknown.   . Morphine     "Makes me go crazy."   . Oxycodone Hcl Other (See Comments)    Knocked her out for 6 days  . Penicillins Hives    Has patient had a PCN reaction causing immediate rash, facial/tongue/throat swelling, SOB or lightheadedness with hypotension: unknown Has patient had a PCN reaction causing severe rash involving mucus membranes or skin necrosis: unknown Has patient had a PCN reaction that required hospitalization No Has patient had a PCN reaction occurring within the last 10 years: No If all of the above answers are "NO", then may proceed with Cephalosporin use.   . Tape  Other (See Comments)    Skin turns red and burns      Outpatient Medications Prior to Visit  Medication Sig Dispense Refill  . albuterol (PROVENTIL) (2.5 MG/3ML) 0.083% nebulizer solution Take 3 mLs (2.5 mg total) by nebulization 4 (four) times daily. 120 vial 11  . ALPRAZolam (XANAX) 0.25 MG tablet Take 0.25 mg by mouth at bedtime as needed for anxiety.    . cycloSPORINE (RESTASIS) 0.05 % ophthalmic emulsion Place 1 drop into both eyes 2 (two) times daily. 0.4 mL 2  . DiphenhydrAMINE HCl, Sleep, (ZZZQUIL PO) Take 1 tablet by mouth at bedtime.    [name] HYDROcodone-acetaminophen (NORCO) 10-325 MG tablet Take 1 tablet by mouth every 6 (six) hours as needed for moderate pain or severe pain. Please fill on or after 12/18/16 120 tablet 0  . ketoconazole (NIZORAL) 2 % cream as needed.    [name] levothyroxine (SYNTHROID, LEVOTHROID) 150 MCG tablet Take 1 tablet (150 mcg total) by mouth daily. 90 tablet 3  . LINZESS 290 MCG CAPS capsule TAKE ONE CAPSULE BY MOUTH BEFORE BREAKFAST 90 capsule 1  . NON FORMULARY Oxygen- use at night as directed     . pantoprazole (PROTONIX) 40 MG tablet Take 1 tablet (40 mg total) by mouth daily. 90 tablet 3  . PARoxetine (PAXIL) 40 MG tablet TAKE 0.5 tablet BY MOUTH DAILY. 90 tablet 3  . phenytoin (DILANTIN) 100 MG ER capsule 100 mg every morning and 200 mg every night. 270 capsule 3  . promethazine (PHENERGAN) 25 MG tablet Take 1 tablet (25 mg total) by mouth daily. 90 tablet 1  .  SSD 1 % cream Apply topically daily. Apply to burns 50 g 1  . SUMAtriptan (IMITREX) 100 MG tablet TAKE ONE TABLET BY MOUTH EVERY 2 HOURS AS NEEDED FOR  MIGRAINE  OR  HEADACHE.  MAY  REPEAT  IN  2  HOURS  IF  HEADACHE  PERSISTS  OR  RECUR 9 tablet 2  . traZODone (DESYREL) 100 MG tablet Take 1 tablet (100 mg total) by mouth at bedtime. 90 tablet 3  . umeclidinium-vilanterol (ANORO ELLIPTA) 62.5-25 MCG/INH AEPB Inhale 1 puff into the lungs daily. 1 each 11  . Vitamin D, Ergocalciferol, (DRISDOL) 50000  units CAPS capsule Take 1 capsule (50,000 Units total) by mouth once a week. 12 capsule 3  . albuterol (PROVENTIL HFA;VENTOLIN HFA) 108 (90 BASE) MCG/ACT inhaler Inhale 2 puffs into the lungs every 6 (six) hours as needed for wheezing. 1 Inhaler 5  . gabapentin (NEURONTIN) 100 MG capsule Take 1 capsule by mouth 3 (three) times daily.     No facility-administered medications prior to visit.      Review of Systems  Constitutional: Negative for chills and fever.  Genitourinary: Positive for dysuria, frequency and urgency. Negative for flank pain and hematuria.      Objective:    BP (!) 120/52 (BP Location: Left Arm, Patient Position: Sitting, Cuff Size: Large)   Pulse 88   Temp 98.9 F (37.2 C) (Oral)   Resp 18   Ht 5\' 5"  (1.651 m)   Wt 163 lb (73.9 kg)   SpO2 96%   BMI 27.12 kg/m  Nursing note and vital signs reviewed.  Physical Exam  Constitutional: She is oriented to person, place, and time. She appears well-developed and well-nourished. No distress.  Cardiovascular: Normal rate, regular rhythm, normal heart sounds and intact distal pulses.   Pulmonary/Chest: Effort normal and breath sounds normal.  Abdominal: Bowel sounds are normal. She exhibits no mass. There is tenderness in the suprapubic area. There is CVA tenderness. There is no rigidity, no rebound, no guarding, no tenderness at McBurney's point and negative Murphy's sign.  Neurological: She is alert and oriented to person, place, and time.  Skin: Skin is warm and dry.  Psychiatric: She has a normal mood and affect. Her behavior is normal. Judgment and thought content normal.       Assessment & Plan:   Problem List Items Addressed This Visit      Genitourinary   UTI (urinary tract infection) - Primary    In office UA positive for leukocytes and blood and negative for nitrites. Symptoms and exam are consistent with acute cystitis with concern for developing pyelonephritis. Start ciprofloxacin. Start fluconazole as  needed for post-antibiotic candidiasis. Advised to seek further care if symptoms worsen or do not improve.       Relevant Medications   fluconazole (DIFLUCAN) 150 MG tablet   Other Relevant Orders   POCT urinalysis dipstick (Completed)   Urine culture       I have discontinued Ms. Hellinger's gabapentin. I am also having her start on ciprofloxacin and fluconazole. Additionally, I am having her maintain her NON FORMULARY, albuterol, promethazine, (DiphenhydrAMINE HCl, Sleep, (ZZZQUIL PO)), albuterol, ketoconazole, phenytoin, umeclidinium-vilanterol, levothyroxine, traZODone, cycloSPORINE, SSD, Vitamin D (Ergocalciferol), ALPRAZolam, HYDROcodone-acetaminophen, pantoprazole, SUMAtriptan, LINZESS, and PARoxetine.   Meds ordered this encounter  Medications  . ciprofloxacin (CIPRO) 500 MG tablet    Sig: Take 1 tablet (500 mg total) by mouth 2 (two) times daily.    Dispense:  14 tablet    Refill:  0    Order Specific Question:   Supervising Provider    Answer:   Pricilla Holm A [[id number]]  . fluconazole (DIFLUCAN) 150 MG tablet    Sig: Take 1 tablet (150 mg total) by mouth once.    Dispense:  1 tablet    Refill:  0    Order Specific Question:   Supervising Provider    Answer:   Pricilla Holm A [[id number]]     Follow-up: Return if symptoms worsen or fail to improve.  Mauricio Po, FNP

## 2016-11-16 NOTE — Patient Instructions (Signed)
Thank you for choosing [facility].  SUMMARY AND INSTRUCTIONS:  Continue to drink plenty of fluid.  Over the counter AZO as needed for burning.  Complete all doses of the Ciprofloxacin.   Follow up if symptoms worsen or do not improve.   Medication:  Your prescription(s) have been submitted to your pharmacy or been printed and provided for you. Please take as directed and contact our office if you believe you are having problem(s) with the medication(s) or have any questions.  Labs:  Please stop by the lab on the lower level of the building for your blood work. Your results will be released to [place] (or called to you) after review, usually within 72 hours after test completion. If any changes need to be made, you will be notified at that same time.  1.) The lab is open from 7:30am to 5:30 pm Monday-Friday 2.) No appointment is necessary 3.) Fasting (if needed) is 6-8 hours after food and drink; black coffee and water are okay   Follow up:  If your symptoms worsen or fail to improve, please contact our office for further instruction, or in case of emergency go directly to the emergency room at the closest medical facility.    Urinary Tract Infection, Adult A urinary tract infection (UTI) is an infection of any part of the urinary tract. The urinary tract includes the:  Kidneys.  Ureters.  Bladder.  Urethra. These organs make, store, and get rid of pee (urine) in the body. Follow these instructions at home:  Take over-the-counter and prescription medicines only as told by your doctor.  If you were prescribed an antibiotic medicine, take it as told by your doctor. Do not stop taking the antibiotic even if you start to feel better.  Avoid the following drinks:  Alcohol.  Caffeine.  Tea.  Carbonated drinks.  Drink enough fluid to keep your pee clear or pale yellow.  Keep all follow-up visits as told by your doctor. This is important.  Make sure  to:  Empty your bladder often and completely. Do not to hold pee for long periods of time.  Empty your bladder before and after sex.  Wipe from front to back after a bowel movement if you are female. Use each tissue one time when you wipe. Contact a doctor if:  You have back pain.  You have a fever.  You feel sick to your stomach (nauseous).  You throw up (vomit).  Your symptoms do not get better after 3 days.  Your symptoms go away and then come back. Get help right away if:  You have very bad back pain.  You have very bad lower belly (abdominal) pain.  You are throwing up and cannot keep down any medicines or water. This information is not intended to replace advice given to you by your health care provider. Make sure you discuss any questions you have with your health care provider. Document Released: 11/29/2007 Document Revised: 11/18/2015 Document Reviewed: 05/03/2015 Elsevier Interactive Patient Education  2017 [employer].

## 2016-11-18 LAB — URINE CULTURE

## 2016-11-20 ENCOUNTER — Encounter: Payer: Self-pay | Admitting: Family

## 2016-11-23 ENCOUNTER — Ambulatory Visit: Payer: Medicare HMO | Admitting: Internal Medicine

## 2016-11-27 DIAGNOSIS — Z1231 Encounter for screening mammogram for malignant neoplasm of breast: Secondary | ICD-10-CM | POA: Diagnosis not present

## 2016-11-27 LAB — HM MAMMOGRAPHY

## 2016-11-29 ENCOUNTER — Encounter: Payer: Self-pay | Admitting: Internal Medicine

## 2016-11-29 ENCOUNTER — Ambulatory Visit (INDEPENDENT_AMBULATORY_CARE_PROVIDER_SITE_OTHER): Payer: Medicare HMO | Admitting: Internal Medicine

## 2016-11-29 DIAGNOSIS — R269 Unspecified abnormalities of gait and mobility: Secondary | ICD-10-CM

## 2016-11-29 DIAGNOSIS — Z993 Dependence on wheelchair: Secondary | ICD-10-CM | POA: Diagnosis not present

## 2016-11-29 DIAGNOSIS — R296 Repeated falls: Secondary | ICD-10-CM | POA: Diagnosis not present

## 2016-11-29 NOTE — Assessment & Plan Note (Signed)
Needs a motorized w/c

## 2016-11-29 NOTE — Assessment & Plan Note (Addendum)
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 Frequent falls due to ataxia Strength: RUE 4/5, LUE 4/5, LLE 4/5, RLE 4/5 Grip is 4/5 B LBP 7-8/10 with decreased ROM Unable to use a manual w/c due to UE weakness Scooter is too big for their residence PMD is necessary to cook, eat and get to the bathroom, to groom herself etc She is able to operate a PMD safely  She is willing and motivated to use a PMD at home

## 2016-11-29 NOTE — Assessment & Plan Note (Signed)
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 Frequent falls due to ataxia Strength: RUE 4/5, LUE 4/5, LLE 4/5, RLE 4/5 Grip is 4/5 B LBP 7-8/10 with decreased ROM Unable to use a manual w/c due to UE weakness Scooter is too big for their residence PMD is necessary to cook, eat and get to the bathroom, to groom herself etc She is able to operate a PMD safely  She is willing and motivated to use a PMD at home

## 2016-11-29 NOTE — Progress Notes (Signed)
Subjective:  Patient ID: [name], female    DOB: February 12, 1956  Age: 61 y.o. MRN: [id number]  CC: No chief complaint on file.   HPI Saoirse L Villafranca presents for FTF eval for a motorized w/c. She had one for 7 years. 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  Outpatient Medications Prior to Visit  Medication Sig Dispense Refill  . albuterol (PROVENTIL) (2.5 MG/3ML) 0.083% nebulizer solution Take 3 mLs (2.5 mg total) by nebulization 4 (four) times daily. 120 vial 11  . ALPRAZolam (XANAX) 0.25 MG tablet Take 0.25 mg by mouth at bedtime as needed for anxiety.    . ciprofloxacin (CIPRO) 500 MG tablet Take 1 tablet (500 mg total) by mouth 2 (two) times daily. 14 tablet 0  . cycloSPORINE (RESTASIS) 0.05 % ophthalmic emulsion Place 1 drop into both eyes 2 (two) times daily. 0.4 mL 2  . DiphenhydrAMINE HCl, Sleep, (ZZZQUIL PO) Take 1 tablet by mouth at bedtime.    [name] HYDROcodone-acetaminophen (NORCO) 10-325 MG tablet Take 1 tablet by mouth every 6 (six) hours as needed for moderate pain or severe pain. Please fill on or after 12/18/16 120 tablet 0  . ketoconazole (NIZORAL) 2 % cream as needed.    [name] levothyroxine (SYNTHROID, LEVOTHROID) 150 MCG tablet Take 1 tablet (150 mcg total) by mouth daily. 90 tablet 3  . LINZESS 290 MCG CAPS capsule TAKE ONE CAPSULE BY MOUTH BEFORE BREAKFAST 90 capsule 1  . NON FORMULARY Oxygen- use at night as directed     . pantoprazole (PROTONIX) 40 MG tablet Take 1 tablet (40 mg total) by mouth daily. 90 tablet 3  . PARoxetine (PAXIL) 40 MG tablet TAKE 0.5 tablet BY MOUTH DAILY. 90 tablet 3  . phenytoin (DILANTIN) 100 MG ER capsule 100 mg every morning and 200 mg every night. 270 capsule 3  . promethazine (PHENERGAN) 25 MG tablet Take 1 tablet (25 mg total) by mouth daily. 90 tablet 1  . SSD 1 % cream Apply topically daily. Apply to burns 50 g 1  . SUMAtriptan (IMITREX) 100 MG tablet TAKE ONE TABLET  BY MOUTH EVERY 2 HOURS AS NEEDED FOR  MIGRAINE  OR  HEADACHE.  MAY  REPEAT  IN  2  HOURS  IF  HEADACHE  PERSISTS  OR  RECUR 9 tablet 2  . traZODone (DESYREL) 100 MG tablet Take 1 tablet (100 mg total) by mouth at bedtime. 90 tablet 3  . umeclidinium-vilanterol (ANORO ELLIPTA) 62.5-25 MCG/INH AEPB Inhale 1 puff into the lungs daily. 1 each 11  . Vitamin D, Ergocalciferol, (DRISDOL) 50000 units CAPS capsule Take 1 capsule (50,000 Units total) by mouth once a week. 12 capsule 3  . albuterol (PROVENTIL HFA;VENTOLIN HFA) 108 (90 BASE) MCG/ACT inhaler Inhale 2 puffs into the lungs every 6 (six) hours as needed for wheezing. 1 Inhaler 5   No facility-administered medications prior to visit.     ROS Review of Systems  Constitutional: Positive for fatigue. Negative for activity change, appetite change, chills and unexpected weight change.  HENT: Positive for congestion. Negative for mouth sores and sinus pressure.   Eyes: Negative for visual disturbance.  Respiratory: Positive for cough. Negative for chest tightness.   Gastrointestinal: Positive for constipation and nausea. Negative for abdominal pain and vomiting.  Genitourinary: Positive for frequency. Negative for difficulty urinating and vaginal pain.  Musculoskeletal: Positive for arthralgias, back pain and gait problem.  Skin:  Negative for pallor, rash and wound.  Neurological: Positive for dizziness, seizures, weakness, light-headedness and headaches. Negative for tremors and numbness.  Hematological: Negative for adenopathy. Does not bruise/bleed easily.  Psychiatric/Behavioral: Positive for sleep disturbance. Negative for confusion and suicidal ideas. The patient is nervous/anxious.     Objective:  BP (!) 122/58 (BP Location: Left Arm, Patient Position: Sitting, Cuff Size: Normal)   Pulse 81   Temp 98 F (36.7 C) (Oral)   Ht 5\' 5"  (1.651 m)   Wt 167 lb (75.8 kg)   SpO2 98%   BMI 27.79 kg/m   BP Readings from Last 3 Encounters:    11/29/16 (!) 122/58  11/16/16 (!) 120/52  10/30/16 126/68    Wt Readings from Last 3 Encounters:  11/29/16 167 lb (75.8 kg)  11/16/16 163 lb (73.9 kg)  10/30/16 164 lb 1.3 oz (74.4 kg)    Physical Exam  Constitutional: She appears well-developed. No distress.  HENT:  Head: Normocephalic.  Right Ear: External ear normal.  Left Ear: External ear normal.  Nose: Nose normal.  Mouth/Throat: Oropharynx is clear and moist.  Eyes: Conjunctivae are normal. Pupils are equal, round, and reactive to light. Right eye exhibits no discharge. Left eye exhibits no discharge.  Neck: Normal range of motion. Neck supple. No JVD present. No tracheal deviation present. No thyromegaly present.  Cardiovascular: Normal rate, regular rhythm and normal heart sounds.   Pulmonary/Chest: No stridor. No respiratory distress. She has no wheezes.  Abdominal: Soft. Bowel sounds are normal. She exhibits no distension and no mass. There is no tenderness. There is no rebound and no guarding.  Musculoskeletal: She exhibits tenderness. She exhibits no edema.  Lymphadenopathy:    She has no cervical adenopathy.  Neurological: She displays normal reflexes. No cranial nerve deficit. She exhibits normal muscle tone. Coordination abnormal.  Skin: No rash noted. No erythema.  Psychiatric: Her behavior is normal. Judgment and thought content normal.  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 Frequent falls due to ataxia Strength: RUE 4/5, LUE 4/5, LLE 4/5, RLE 4/5 Grip is 4/5 B LBP 7-8/10 with decreased ROM Unable to use a manual w/c due to UE weakness Scooter is too big for their residence PMD is necessary to cook, eat and get to the bathroom, to groom herself etc She is able to operate a PMD safely  She is willing and motivated to use a PMD at home   I spent total of >45 minutes face-to-face with patient and greater than 50% was  spent counseling and or coordinating care - we discussed the need of PMD, paperwork was filled out   Lab Results  Component Value Date   WBC 8.4 10/03/2016   HGB 13.3 10/03/2016   HCT 39.7 10/03/2016   PLT 295.0 10/03/2016   GLUCOSE 88 10/03/2016   CHOL 310 (H) 07/14/2015   TRIG 300.0 (H) 07/14/2015   HDL 38.00 (L) 07/14/2015   LDLDIRECT 187.0 07/14/2015   LDLCALC (H) 05/12/2009    240        Total Cholesterol/HDL:CHD Risk Coronary Heart Disease Risk Table                     Men   Women  1/2 Average Risk   3.4   3.3  Average Risk       5.0   4.4  2 X Average Risk  9.6   7.1  3 X Average Risk  23.4   11.0        Use the calculated Patient Ratio above and the CHD Risk Table to determine the patient's CHD Risk.        ATP III CLASSIFICATION (LDL):  <100     mg/dL   Optimal  100-129  mg/dL   Near or Above                    Optimal  130-159  mg/dL   Borderline  160-189  mg/dL   High  >190     mg/dL   Very High   ALT 11 10/03/2016   AST 13 10/03/2016   NA 138 10/03/2016   K 4.7 10/03/2016   CL 103 10/03/2016   CREATININE 0.98 10/03/2016   BUN 11 10/03/2016   CO2 31 10/03/2016   TSH 0.46 07/14/2015   INR 1.0 09/16/2008   HGBA1C  09/17/2008    5.0 (NOTE)   The ADA recommends the following therapeutic goal for glycemic   control related to Hgb A1C measurement:   Goal of Therapy:   < 7.0% Hgb A1C   Reference: American Diabetes Association: Clinical Practice   Recommendations 2008, Diabetes Care,  2008, 31:(Suppl 1).    Dg Chest 2 View  Result Date: 10/03/2016 CLINICAL DATA:  Cough, congestion, chest pain over the last 2 weeks, smoking history EXAM: CHEST  2 VIEW COMPARISON:  Chest x-ray of 09/08/2015 FINDINGS: No active infiltrate or effusion is seen. Mediastinal and hilar contours are unremarkable. The heart is within normal limits in size. No acute bony abnormality seen, with multiple bilateral healed rib fractures again noted. IMPRESSION: 1. No active cardiopulmonary  disease. 2. Multiple healed bilateral rib fractures Electronically Signed   By: Ivar Drape M.D.   On: 10/03/2016 11:12    Assessment & Plan:   There are no diagnoses linked to this encounter. I am having Ms. Dubach maintain her NON FORMULARY, albuterol, promethazine, (DiphenhydrAMINE HCl, Sleep, (ZZZQUIL PO)), albuterol, ketoconazole, phenytoin, umeclidinium-vilanterol, levothyroxine, traZODone, cycloSPORINE, SSD, Vitamin D (Ergocalciferol), ALPRAZolam, HYDROcodone-acetaminophen, pantoprazole, SUMAtriptan, LINZESS, PARoxetine, and ciprofloxacin.  No orders of the defined types were placed in this encounter.    Follow-up: No Follow-up on file.  Walker Kehr, MD

## 2016-11-30 ENCOUNTER — Encounter: Payer: Self-pay | Admitting: Internal Medicine

## 2016-12-05 ENCOUNTER — Encounter: Payer: Self-pay | Admitting: Internal Medicine

## 2016-12-05 NOTE — Progress Notes (Unsigned)
Results entered and sent to scan  

## 2016-12-11 ENCOUNTER — Telehealth: Payer: Self-pay | Admitting: Internal Medicine

## 2016-12-11 NOTE — Telephone Encounter (Signed)
Pt called returning a call from you? She did not know what it was about. Please call back

## 2016-12-12 NOTE — Telephone Encounter (Signed)
Spoke with patient about hoveround application and options or wheelchair.. Form faxed

## 2016-12-13 DIAGNOSIS — G8929 Other chronic pain: Secondary | ICD-10-CM | POA: Diagnosis not present

## 2016-12-13 DIAGNOSIS — M1712 Unilateral primary osteoarthritis, left knee: Secondary | ICD-10-CM | POA: Diagnosis not present

## 2016-12-13 DIAGNOSIS — M25562 Pain in left knee: Secondary | ICD-10-CM | POA: Diagnosis not present

## 2016-12-25 NOTE — Telephone Encounter (Signed)
Papers getting refaxed

## 2016-12-25 NOTE — Telephone Encounter (Signed)
Sandy from [employer] called checking to see if we received the final order for this pts power wheelchair. She said that it was a sheet with the chair, batteries and pricing that was to be signed and dated by Dr. Alain Marion. Lovey Newcomer can be reached at [phone] Ref# [id number].

## 2016-12-26 NOTE — Telephone Encounter (Signed)
Please refax, the one that says final step at the bottom Fax Number [phone]

## 2016-12-28 NOTE — Telephone Encounter (Signed)
Getting for refaxed, never received

## 2016-12-28 NOTE — Telephone Encounter (Signed)
Received form and have it for Plotnikov to sign

## 2016-12-28 NOTE — Telephone Encounter (Signed)
They still have not received this one page document with the pricing. She asked if it could be resent. She gave me an alternate fax number to try [phone].

## 2017-01-10 DIAGNOSIS — I639 Cerebral infarction, unspecified: Secondary | ICD-10-CM | POA: Diagnosis not present

## 2017-01-10 DIAGNOSIS — J441 Chronic obstructive pulmonary disease with (acute) exacerbation: Secondary | ICD-10-CM | POA: Diagnosis not present

## 2017-01-10 DIAGNOSIS — R296 Repeated falls: Secondary | ICD-10-CM | POA: Diagnosis not present

## 2017-01-10 DIAGNOSIS — M5440 Lumbago with sciatica, unspecified side: Secondary | ICD-10-CM | POA: Diagnosis not present

## 2017-01-11 ENCOUNTER — Ambulatory Visit (INDEPENDENT_AMBULATORY_CARE_PROVIDER_SITE_OTHER): Payer: Medicare HMO | Admitting: Internal Medicine

## 2017-01-11 ENCOUNTER — Encounter: Payer: Self-pay | Admitting: Internal Medicine

## 2017-01-11 ENCOUNTER — Other Ambulatory Visit (INDEPENDENT_AMBULATORY_CARE_PROVIDER_SITE_OTHER): Payer: Medicare HMO

## 2017-01-11 DIAGNOSIS — M5440 Lumbago with sciatica, unspecified side: Secondary | ICD-10-CM

## 2017-01-11 DIAGNOSIS — R69 Illness, unspecified: Secondary | ICD-10-CM | POA: Diagnosis not present

## 2017-01-11 DIAGNOSIS — R270 Ataxia, unspecified: Secondary | ICD-10-CM

## 2017-01-11 DIAGNOSIS — K5901 Slow transit constipation: Secondary | ICD-10-CM

## 2017-01-11 DIAGNOSIS — E034 Atrophy of thyroid (acquired): Secondary | ICD-10-CM | POA: Diagnosis not present

## 2017-01-11 DIAGNOSIS — G8929 Other chronic pain: Secondary | ICD-10-CM | POA: Diagnosis not present

## 2017-01-11 DIAGNOSIS — F411 Generalized anxiety disorder: Secondary | ICD-10-CM | POA: Diagnosis not present

## 2017-01-11 DIAGNOSIS — E559 Vitamin D deficiency, unspecified: Secondary | ICD-10-CM

## 2017-01-11 LAB — URINALYSIS
Bilirubin Urine: NEGATIVE
Hgb urine dipstick: NEGATIVE
Ketones, ur: NEGATIVE
Leukocytes, UA: NEGATIVE
Nitrite: NEGATIVE
Specific Gravity, Urine: 1.025 (ref 1.000–1.030)
Total Protein, Urine: NEGATIVE
Urine Glucose: NEGATIVE
Urobilinogen, UA: 0.2 (ref 0.0–1.0)
pH: 6 (ref 5.0–8.0)

## 2017-01-11 MED ORDER — HYDROCODONE-ACETAMINOPHEN 10-325 MG PO TABS: 1.0000 | ORAL_TABLET | Freq: Four times a day (QID) | ORAL | 0 refills | Status: DC | PRN

## 2017-01-11 MED ORDER — ALPRAZOLAM 0.25 MG PO TABS: 0.2500 mg | ORAL_TABLET | Freq: Every evening | ORAL | 2 refills | Status: DC | PRN

## 2017-01-11 MED ORDER — PHENYTOIN SODIUM EXTENDED 100 MG PO CAPS: ORAL_CAPSULE | ORAL | 3 refills | Status: DC

## 2017-01-11 NOTE — Assessment & Plan Note (Signed)
On Levothroid 

## 2017-01-11 NOTE — Assessment & Plan Note (Signed)
Post CVA In a w/c

## 2017-01-11 NOTE — Progress Notes (Signed)
Subjective:  Patient ID: Kaylee Hansen, female    DOB: Nov 22, 1955  Age: 61 y.o. MRN: [id number]  CC: No chief complaint on file.   HPI Kaylee Hansen presents for LBP, CVA, depression f/u. Declined Shingrix  Outpatient Medications Prior to Visit  Medication Sig Dispense Refill  . albuterol (PROVENTIL) (2.5 MG/3ML) 0.083% nebulizer solution Take 3 mLs (2.5 mg total) by nebulization 4 (four) times daily. 120 vial 11  . ALPRAZolam (XANAX) 0.25 MG tablet Take 0.25 mg by mouth at bedtime as needed for anxiety.    . ciprofloxacin (CIPRO) 500 MG tablet Take 1 tablet (500 mg total) by mouth 2 (two) times daily. 14 tablet 0  . cycloSPORINE (RESTASIS) 0.05 % ophthalmic emulsion Place 1 drop into both eyes 2 (two) times daily. 0.4 mL 2  . DiphenhydrAMINE HCl, Sleep, (ZZZQUIL PO) Take 1 tablet by mouth at bedtime.    [name] HYDROcodone-acetaminophen (NORCO) 10-325 MG tablet Take 1 tablet by mouth every 6 (six) hours as needed for moderate pain or severe pain. Please fill on or after 12/18/16 120 tablet 0  . ketoconazole (NIZORAL) 2 % cream as needed.    [name] levothyroxine (SYNTHROID, LEVOTHROID) 150 MCG tablet Take 1 tablet (150 mcg total) by mouth daily. 90 tablet 3  . LINZESS 290 MCG CAPS capsule TAKE ONE CAPSULE BY MOUTH BEFORE BREAKFAST 90 capsule 1  . NON FORMULARY Oxygen- use at night as directed     . pantoprazole (PROTONIX) 40 MG tablet Take 1 tablet (40 mg total) by mouth daily. 90 tablet 3  . PARoxetine (PAXIL) 40 MG tablet TAKE 0.5 tablet BY MOUTH DAILY. 90 tablet 3  . phenytoin (DILANTIN) 100 MG ER capsule 100 mg every morning and 200 mg every night. 270 capsule 3  . promethazine (PHENERGAN) 25 MG tablet Take 1 tablet (25 mg total) by mouth daily. 90 tablet 1  . SSD 1 % cream Apply topically daily. Apply to burns 50 g 1  . SUMAtriptan (IMITREX) 100 MG tablet TAKE ONE TABLET BY MOUTH EVERY 2 HOURS AS NEEDED FOR  MIGRAINE  OR  HEADACHE.  MAY  REPEAT  IN  2  HOURS  IF  HEADACHE  PERSISTS  OR  RECUR  9 tablet 2  . traZODone (DESYREL) 100 MG tablet Take 1 tablet (100 mg total) by mouth at bedtime. 90 tablet 3  . umeclidinium-vilanterol (ANORO ELLIPTA) 62.5-25 MCG/INH AEPB Inhale 1 puff into the lungs daily. 1 each 11  . Vitamin D, Ergocalciferol, (DRISDOL) 50000 units CAPS capsule Take 1 capsule (50,000 Units total) by mouth once a week. 12 capsule 3  . albuterol (PROVENTIL HFA;VENTOLIN HFA) 108 (90 BASE) MCG/ACT inhaler Inhale 2 puffs into the lungs every 6 (six) hours as needed for wheezing. 1 Inhaler 5   No facility-administered medications prior to visit.     ROS Review of Systems  Constitutional: Positive for fatigue. Negative for activity change, appetite change, chills and unexpected weight change.  HENT: Negative for congestion, mouth sores and sinus pressure.   Eyes: Negative for visual disturbance.  Respiratory: Negative for cough, chest tightness and shortness of breath.   Cardiovascular: Negative for chest pain.  Gastrointestinal: Negative for abdominal pain and nausea.  Genitourinary: Positive for urgency. Negative for difficulty urinating, frequency and vaginal pain.  Musculoskeletal: Positive for arthralgias, back pain and gait problem.  Skin: Negative for pallor and rash.  Neurological: Positive for dizziness, weakness and headaches. Negative for tremors and numbness.  Psychiatric/Behavioral: Positive for sleep disturbance.  Negative for confusion and suicidal ideas. The patient is nervous/anxious.     Objective:  BP 126/64 (BP Location: Left Arm, Patient Position: Sitting, Cuff Size: Normal)   Pulse 87   Temp 98.6 F (37 C) (Oral)   Ht 5\' 5"  (1.651 m)   Wt 169 lb (76.7 kg)   SpO2 98%   BMI 28.12 kg/m   BP Readings from Last 3 Encounters:  01/11/17 126/64  11/29/16 (!) 122/58  11/16/16 (!) 120/52    Wt Readings from Last 3 Encounters:  01/11/17 169 lb (76.7 kg)  11/29/16 167 lb (75.8 kg)  11/16/16 163 lb (73.9 kg)    Physical Exam  Constitutional: She  appears well-developed. No distress.  HENT:  Head: Normocephalic.  Right Ear: External ear normal.  Left Ear: External ear normal.  Nose: Nose normal.  Mouth/Throat: Oropharynx is clear and moist.  Eyes: Pupils are equal, round, and reactive to light. Conjunctivae are normal. Right eye exhibits no discharge. Left eye exhibits no discharge.  Neck: Normal range of motion. Neck supple. No JVD present. No tracheal deviation present. No thyromegaly present.  Cardiovascular: Normal rate, regular rhythm and normal heart sounds.   Pulmonary/Chest: No stridor. No respiratory distress. She has no wheezes.  Abdominal: Soft. Bowel sounds are normal. She exhibits no distension and no mass. There is no tenderness. There is no rebound and no guarding.  Musculoskeletal: She exhibits tenderness. She exhibits no edema.  Lymphadenopathy:    She has no cervical adenopathy.  Neurological: She displays normal reflexes. No cranial nerve deficit. She exhibits normal muscle tone. Coordination abnormal.  Skin: No rash noted. No erythema.  Psychiatric: She has a normal mood and affect. Her behavior is normal. Judgment and thought content normal.  in a w/c  Lab Results  Component Value Date   WBC 8.4 10/03/2016   HGB 13.3 10/03/2016   HCT 39.7 10/03/2016   PLT 295.0 10/03/2016   GLUCOSE 88 10/03/2016   CHOL 310 (H) 07/14/2015   TRIG 300.0 (H) 07/14/2015   HDL 38.00 (L) 07/14/2015   LDLDIRECT 187.0 07/14/2015   LDLCALC (H) 05/12/2009    240        Total Cholesterol/HDL:CHD Risk Coronary Heart Disease Risk Table                     Men   Women  1/2 Average Risk   3.4   3.3  Average Risk       5.0   4.4  2 X Average Risk   9.6   7.1  3 X Average Risk  23.4   11.0        Use the calculated Patient Ratio above and the CHD Risk Table to determine the patient's CHD Risk.        ATP III CLASSIFICATION (LDL):  <100     mg/dL   Optimal  100-129  mg/dL   Near or Above                    Optimal  130-159   mg/dL   Borderline  160-189  mg/dL   High  >190     mg/dL   Very High   ALT 11 10/03/2016   AST 13 10/03/2016   NA 138 10/03/2016   K 4.7 10/03/2016   CL 103 10/03/2016   CREATININE 0.98 10/03/2016   BUN 11 10/03/2016   CO2 31 10/03/2016   TSH 0.46 07/14/2015   INR 1.0 09/16/2008  HGBA1C  09/17/2008    5.0 (NOTE)   The ADA recommends the following therapeutic goal for glycemic   control related to Hgb A1C measurement:   Goal of Therapy:   < 7.0% Hgb A1C   Reference: American Diabetes Association: Clinical Practice   Recommendations 2008, Diabetes Care,  2008, 31:(Suppl 1).    Dg Chest 2 View  Result Date: 10/03/2016 CLINICAL DATA:  Cough, congestion, chest pain over the last 2 weeks, smoking history EXAM: CHEST  2 VIEW COMPARISON:  Chest x-ray of 09/08/2015 FINDINGS: No active infiltrate or effusion is seen. Mediastinal and hilar contours are unremarkable. The heart is within normal limits in size. No acute bony abnormality seen, with multiple bilateral healed rib fractures again noted. IMPRESSION: 1. No active cardiopulmonary disease. 2. Multiple healed bilateral rib fractures Electronically Signed   By: Ivar Drape M.D.   On: 10/03/2016 11:12    Assessment & Plan:   There are no diagnoses linked to this encounter. I am having Ms. Withey maintain her NON FORMULARY, albuterol, promethazine, (DiphenhydrAMINE HCl, Sleep, (ZZZQUIL PO)), albuterol, ketoconazole, phenytoin, umeclidinium-vilanterol, levothyroxine, traZODone, cycloSPORINE, SSD, Vitamin D (Ergocalciferol), ALPRAZolam, HYDROcodone-acetaminophen, pantoprazole, SUMAtriptan, LINZESS, PARoxetine, and ciprofloxacin.  No orders of the defined types were placed in this encounter.    Follow-up: No Follow-up on file.  Walker Kehr, MD

## 2017-01-11 NOTE — Assessment & Plan Note (Signed)
Trazodone at hs Xanax prn  Potential benefits of a long term benzodiazepines  use as well as potential risks  and complications were explained to the patient and were aknowledged.

## 2017-01-11 NOTE — Assessment & Plan Note (Signed)
On Vit D 

## 2017-01-11 NOTE — Assessment & Plan Note (Signed)
Linzess

## 2017-01-11 NOTE — Assessment & Plan Note (Signed)
Norco  Potential benefits of a long term opioids use as well as potential risks (i.e. addiction risk, apnea etc) and complications (i.e. Somnolence, constipation and others) were explained to the patient and were aknowledged. 

## 2017-02-02 ENCOUNTER — Telehealth: Payer: Self-pay | Admitting: Internal Medicine

## 2017-02-02 MED ORDER — LINACLOTIDE 290 MCG PO CAPS: ORAL_CAPSULE | ORAL | 2 refills | Status: DC

## 2017-02-02 NOTE — Telephone Encounter (Signed)
Pt called in and said that she needs refill on LINZESS 290 MCG CAPS capsule [[id number] But she needs 30 days at a time.  She can not afford 90 day supply.  [place] .

## 2017-02-02 NOTE — Telephone Encounter (Signed)
Reviewed chart pt is up-to-date sent refill to [facility]...Kaylee Hansen

## 2017-03-19 DIAGNOSIS — C3490 Malignant neoplasm of unspecified part of unspecified bronchus or lung: Secondary | ICD-10-CM | POA: Diagnosis not present

## 2017-04-04 ENCOUNTER — Encounter: Payer: Self-pay | Admitting: Internal Medicine

## 2017-04-04 ENCOUNTER — Ambulatory Visit (INDEPENDENT_AMBULATORY_CARE_PROVIDER_SITE_OTHER): Payer: Medicare HMO | Admitting: Internal Medicine

## 2017-04-04 VITALS — BP 128/62 | HR 81 | Temp 98.4°F | Ht 65.0 in | Wt 170.0 lb

## 2017-04-04 DIAGNOSIS — K5901 Slow transit constipation: Secondary | ICD-10-CM

## 2017-04-04 DIAGNOSIS — J441 Chronic obstructive pulmonary disease with (acute) exacerbation: Secondary | ICD-10-CM

## 2017-04-04 DIAGNOSIS — Z23 Encounter for immunization: Secondary | ICD-10-CM

## 2017-04-04 MED ORDER — LACTULOSE 20 GM/30ML PO SOLN: ORAL | 3 refills | Status: DC

## 2017-04-04 MED ORDER — HYDROCODONE-ACETAMINOPHEN 10-325 MG PO TABS: 1.0000 | ORAL_TABLET | Freq: Four times a day (QID) | ORAL | 0 refills | Status: DC | PRN

## 2017-04-04 MED ORDER — ALBUTEROL SULFATE HFA 108 (90 BASE) MCG/ACT IN AERS: 2.0000 | INHALATION_SPRAY | Freq: Four times a day (QID) | RESPIRATORY_TRACT | 5 refills | Status: DC | PRN

## 2017-04-04 NOTE — Progress Notes (Signed)
Subjective:  Patient ID: Kaylee Hansen, female    DOB: September 12, 1955  Age: 61 y.o. MRN: [id number]  CC: No chief complaint on file.   HPI Kaylee Hansen presents for LBP, seizures, hypothyroidism, anxiety f/u  Outpatient Medications Prior to Visit  Medication Sig Dispense Refill  . albuterol (PROVENTIL) (2.5 MG/3ML) 0.083% nebulizer solution Take 3 mLs (2.5 mg total) by nebulization 4 (four) times daily. 120 vial 11  . ALPRAZolam (XANAX) 0.25 MG tablet Take 1 tablet (0.25 mg total) by mouth at bedtime as needed for anxiety. 30 tablet 2  . cycloSPORINE (RESTASIS) 0.05 % ophthalmic emulsion Place 1 drop into both eyes 2 (two) times daily. 0.4 mL 2  . DiphenhydrAMINE HCl, Sleep, (ZZZQUIL PO) Take 1 tablet by mouth at bedtime.    [name] HYDROcodone-acetaminophen (NORCO) 10-325 MG tablet Take 1 tablet by mouth every 6 (six) hours as needed for moderate pain or severe pain. Please fill on or after 03/20/17 120 tablet 0  . ketoconazole (NIZORAL) 2 % cream as needed.    [name] levothyroxine (SYNTHROID, LEVOTHROID) 150 MCG tablet Take 1 tablet (150 mcg total) by mouth daily. 90 tablet 3  . linaclotide (LINZESS) 290 MCG CAPS capsule TAKE ONE CAPSULE BY MOUTH BEFORE BREAKFAST 30 capsule 2  . NON FORMULARY Oxygen- use at night as directed     . pantoprazole (PROTONIX) 40 MG tablet Take 1 tablet (40 mg total) by mouth daily. 90 tablet 3  . PARoxetine (PAXIL) 40 MG tablet TAKE 0.5 tablet BY MOUTH DAILY. 90 tablet 3  . phenytoin (DILANTIN) 100 MG ER capsule 100 mg every morning and 200 mg every night. 270 capsule 3  . promethazine (PHENERGAN) 25 MG tablet Take 1 tablet (25 mg total) by mouth daily. 90 tablet 1  . SSD 1 % cream Apply topically daily. Apply to burns 50 g 1  . SUMAtriptan (IMITREX) 100 MG tablet TAKE ONE TABLET BY MOUTH EVERY 2 HOURS AS NEEDED FOR  MIGRAINE  OR  HEADACHE.  MAY  REPEAT  IN  2  HOURS  IF  HEADACHE  PERSISTS  OR  RECUR 9 tablet 2  . traZODone (DESYREL) 100 MG tablet Take 1 tablet (100 mg  total) by mouth at bedtime. 90 tablet 3  . umeclidinium-vilanterol (ANORO ELLIPTA) 62.5-25 MCG/INH AEPB Inhale 1 puff into the lungs daily. 1 each 11  . Vitamin D, Ergocalciferol, (DRISDOL) 50000 units CAPS capsule Take 1 capsule (50,000 Units total) by mouth once a week. 12 capsule 3  . albuterol (PROVENTIL HFA;VENTOLIN HFA) 108 (90 BASE) MCG/ACT inhaler Inhale 2 puffs into the lungs every 6 (six) hours as needed for wheezing. 1 Inhaler 5  . ciprofloxacin (CIPRO) 500 MG tablet Take 1 tablet (500 mg total) by mouth 2 (two) times daily. 14 tablet 0   No facility-administered medications prior to visit.     ROS Review of Systems  Constitutional: Positive for fatigue. Negative for activity change, appetite change, chills and unexpected weight change.  HENT: Negative for congestion, mouth sores and sinus pressure.   Eyes: Negative for visual disturbance.  Respiratory: Negative for cough and chest tightness.   Cardiovascular: Negative for leg swelling.  Gastrointestinal: Negative for abdominal pain and nausea.  Genitourinary: Negative for difficulty urinating, frequency and vaginal pain.  Musculoskeletal: Positive for arthralgias, back pain, gait problem and neck pain.  Skin: Negative for pallor and rash.  Neurological: Positive for dizziness, weakness and light-headedness. Negative for tremors, seizures, numbness and headaches.  Psychiatric/Behavioral: Positive for  sleep disturbance. Negative for confusion and suicidal ideas. The patient is nervous/anxious.     Objective:  BP 128/62 (BP Location: Left Arm, Patient Position: Sitting, Cuff Size: Large)   Pulse 81   Temp 98.4 F (36.9 C) (Oral)   Ht 5\' 5"  (1.651 m)   Wt 170 lb (77.1 kg)   SpO2 98%   BMI 28.29 kg/m   BP Readings from Last 3 Encounters:  04/04/17 128/62  01/11/17 126/64  11/29/16 (!) 122/58    Wt Readings from Last 3 Encounters:  04/04/17 170 lb (77.1 kg)  01/11/17 169 lb (76.7 kg)  11/29/16 167 lb (75.8 kg)     Physical Exam  Constitutional: She appears well-developed. No distress.  HENT:  Head: Normocephalic.  Right Ear: External ear normal.  Left Ear: External ear normal.  Nose: Nose normal.  Mouth/Throat: Oropharynx is clear and moist.  Eyes: Pupils are equal, round, and reactive to light. Conjunctivae are normal. Right eye exhibits no discharge. Left eye exhibits no discharge.  Neck: Normal range of motion. Neck supple. No JVD present. No tracheal deviation present. No thyromegaly present.  Cardiovascular: Normal rate, regular rhythm and normal heart sounds.   Pulmonary/Chest: No stridor. No respiratory distress. She has no wheezes.  Abdominal: Soft. Bowel sounds are normal. She exhibits no distension and no mass. There is no tenderness. There is no rebound and no guarding.  Musculoskeletal: She exhibits tenderness. She exhibits no edema.  Lymphadenopathy:    She has no cervical adenopathy.  Neurological: She displays normal reflexes. No cranial nerve deficit. She exhibits normal muscle tone. Coordination abnormal.  Skin: No rash noted. No erythema.  Psychiatric: She has a normal mood and affect. Her behavior is normal. Judgment and thought content normal.  in a w/c LS, neck - tender w/ROM  Lab Results  Component Value Date   WBC 8.4 10/03/2016   HGB 13.3 10/03/2016   HCT 39.7 10/03/2016   PLT 295.0 10/03/2016   GLUCOSE 88 10/03/2016   CHOL 310 (H) 07/14/2015   TRIG 300.0 (H) 07/14/2015   HDL 38.00 (L) 07/14/2015   LDLDIRECT 187.0 07/14/2015   LDLCALC (H) 05/12/2009    240        Total Cholesterol/HDL:CHD Risk Coronary Heart Disease Risk Table                     Men   Women  1/2 Average Risk   3.4   3.3  Average Risk       5.0   4.4  2 X Average Risk   9.6   7.1  3 X Average Risk  23.4   11.0        Use the calculated Patient Ratio above and the CHD Risk Table to determine the patient's CHD Risk.        ATP III CLASSIFICATION (LDL):  <100     mg/dL   Optimal   100-129  mg/dL   Near or Above                    Optimal  130-159  mg/dL   Borderline  160-189  mg/dL   High  >190     mg/dL   Very High   ALT 11 10/03/2016   AST 13 10/03/2016   NA 138 10/03/2016   K 4.7 10/03/2016   CL 103 10/03/2016   CREATININE 0.98 10/03/2016   BUN 11 10/03/2016   CO2 31 10/03/2016   TSH 0.46  07/14/2015   INR 1.0 09/16/2008   HGBA1C  09/17/2008    5.0 (NOTE)   The ADA recommends the following therapeutic goal for glycemic   control related to Hgb A1C measurement:   Goal of Therapy:   < 7.0% Hgb A1C   Reference: American Diabetes Association: Clinical Practice   Recommendations 2008, Diabetes Care,  2008, 31:(Suppl 1).    Dg Chest 2 View  Result Date: 10/03/2016 CLINICAL DATA:  Cough, congestion, chest pain over the last 2 weeks, smoking history EXAM: CHEST  2 VIEW COMPARISON:  Chest x-ray of 09/08/2015 FINDINGS: No active infiltrate or effusion is seen. Mediastinal and hilar contours are unremarkable. The heart is within normal limits in size. No acute bony abnormality seen, with multiple bilateral healed rib fractures again noted. IMPRESSION: 1. No active cardiopulmonary disease. 2. Multiple healed bilateral rib fractures Electronically Signed   By: Ivar Drape M.D.   On: 10/03/2016 11:12    Assessment & Plan:   There are no diagnoses linked to this encounter. I have discontinued Ms. Heick's ciprofloxacin. I am also having her maintain her NON FORMULARY, albuterol, promethazine, (DiphenhydrAMINE HCl, Sleep, (ZZZQUIL PO)), albuterol, ketoconazole, umeclidinium-vilanterol, levothyroxine, traZODone, cycloSPORINE, SSD, Vitamin D (Ergocalciferol), pantoprazole, SUMAtriptan, PARoxetine, phenytoin, HYDROcodone-acetaminophen, ALPRAZolam, and linaclotide.  No orders of the defined types were placed in this encounter.    Follow-up: No Follow-up on file.  Walker Kehr, MD

## 2017-04-04 NOTE — Assessment & Plan Note (Signed)
Added Lactulose

## 2017-04-04 NOTE — Assessment & Plan Note (Signed)
Proventil

## 2017-04-04 NOTE — Addendum Note (Signed)
Addended by: Karren Cobble on: 04/04/2017 11:44 AM   Modules accepted: Orders

## 2017-04-12 ENCOUNTER — Ambulatory Visit: Payer: Medicare HMO | Admitting: Internal Medicine

## 2017-04-18 DIAGNOSIS — C3490 Malignant neoplasm of unspecified part of unspecified bronchus or lung: Secondary | ICD-10-CM | POA: Diagnosis not present

## 2017-04-20 ENCOUNTER — Emergency Department (HOSPITAL_COMMUNITY): Payer: Medicare HMO

## 2017-04-20 ENCOUNTER — Emergency Department (HOSPITAL_COMMUNITY)
Admission: EM | Admit: 2017-04-20 | Discharge: 2017-04-20 | Disposition: A | Discharge status: Home / Self Care | Payer: Medicare HMO | Attending: Emergency Medicine | Admitting: Emergency Medicine

## 2017-04-20 ENCOUNTER — Encounter (HOSPITAL_COMMUNITY): Payer: Self-pay | Admitting: *Deleted

## 2017-04-20 DIAGNOSIS — R0602 Shortness of breath: Secondary | ICD-10-CM | POA: Diagnosis not present

## 2017-04-20 DIAGNOSIS — Z79899 Other long term (current) drug therapy: Secondary | ICD-10-CM | POA: Diagnosis not present

## 2017-04-20 DIAGNOSIS — R0789 Other chest pain: Secondary | ICD-10-CM

## 2017-04-20 DIAGNOSIS — F17210 Nicotine dependence, cigarettes, uncomplicated: Secondary | ICD-10-CM | POA: Diagnosis not present

## 2017-04-20 DIAGNOSIS — R69 Illness, unspecified: Secondary | ICD-10-CM | POA: Diagnosis not present

## 2017-04-20 DIAGNOSIS — Z8673 Personal history of transient ischemic attack (TIA), and cerebral infarction without residual deficits: Secondary | ICD-10-CM | POA: Insufficient documentation

## 2017-04-20 DIAGNOSIS — I2510 Atherosclerotic heart disease of native coronary artery without angina pectoris: Secondary | ICD-10-CM | POA: Diagnosis not present

## 2017-04-20 DIAGNOSIS — E039 Hypothyroidism, unspecified: Secondary | ICD-10-CM | POA: Diagnosis not present

## 2017-04-20 DIAGNOSIS — J441 Chronic obstructive pulmonary disease with (acute) exacerbation: Secondary | ICD-10-CM | POA: Diagnosis not present

## 2017-04-20 DIAGNOSIS — R079 Chest pain, unspecified: Secondary | ICD-10-CM | POA: Diagnosis not present

## 2017-04-20 DIAGNOSIS — I10 Essential (primary) hypertension: Secondary | ICD-10-CM | POA: Insufficient documentation

## 2017-04-20 LAB — BASIC METABOLIC PANEL
Anion gap: 6 (ref 5–15)
BUN: 11 mg/dL (ref 6–20)
CO2: 29 mmol/L (ref 22–32)
Calcium: 8.9 mg/dL (ref 8.9–10.3)
Chloride: 105 mmol/L (ref 101–111)
Creatinine, Ser: 1.03 mg/dL — ABNORMAL HIGH (ref 0.44–1.00)
GFR calc Af Amer: >60 mL/min (ref >60)
GFR calc non Af Amer: 57 mL/min — ABNORMAL LOW (ref >60)
Glucose, Bld: 98 mg/dL (ref 65–99)
Potassium: 4.6 mmol/L (ref 3.5–5.1)
Sodium: 140 mmol/L (ref 135–145)

## 2017-04-20 LAB — CBC
HCT: 35.3 % — ABNORMAL LOW (ref 36.0–46.0)
Hemoglobin: 11.3 g/dL — ABNORMAL LOW (ref 12.0–15.0)
MCH: 32.2 pg (ref 26.0–34.0)
MCHC: 32 g/dL (ref 30.0–36.0)
MCV: 100.6 fL — ABNORMAL HIGH (ref 78.0–100.0)
Platelets: 274 10*3/uL (ref 150–400)
RBC: 3.51 MIL/uL — ABNORMAL LOW (ref 3.87–5.11)
RDW: 14.1 % (ref 11.5–15.5)
WBC: 7.4 10*3/uL (ref 4.0–10.5)

## 2017-04-20 LAB — BRAIN NATRIURETIC PEPTIDE: B Natriuretic Peptide: 61.7 pg/mL (ref 0.0–100.0)

## 2017-04-20 LAB — D-DIMER, QUANTITATIVE: D-Dimer, Quant: 0.44 ug/mL-FEU (ref 0.00–0.50)

## 2017-04-20 LAB — POCT I-STAT TROPONIN I
Troponin i, poc: 0.02 ng/mL (ref 0.00–0.08)
Troponin i, poc: 0 ng/mL (ref 0.00–0.08)

## 2017-04-20 MED ORDER — IPRATROPIUM BROMIDE 0.02 % IN SOLN
0.5000 mg | Freq: Once | RESPIRATORY_TRACT | Status: AC
  Administered 2017-04-20: 0.5 mg via RESPIRATORY_TRACT
  Filled 2017-04-20: qty 2.5

## 2017-04-20 MED ORDER — ALBUTEROL SULFATE (2.5 MG/3ML) 0.083% IN NEBU
5.0000 mg | INHALATION_SOLUTION | Freq: Once | RESPIRATORY_TRACT | Status: AC
  Administered 2017-04-20: 5 mg via RESPIRATORY_TRACT
  Filled 2017-04-20: qty 6

## 2017-04-20 MED ORDER — GI COCKTAIL ~~LOC~~
30.0000 mL | Freq: Once | ORAL | Status: AC
  Administered 2017-04-20: 30 mL via ORAL
  Filled 2017-04-20: qty 30

## 2017-04-20 NOTE — ED Triage Notes (Signed)
Pt complains of left leg, left arm pain, chest pain since 12PM today. Pt states she began feeling short of breath at [facility] today.
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
## 2017-04-20 NOTE — Discharge Instructions (Signed)
You have been evaluated for your chest pain and trouble breathing.  Your condition may be due to a COPD exacerbation.  However, please follow up with your primary care provider for further evaluation of your chest pain.  You may benefit from a cardiac stress test.  Return to the ER if your condition worsen or if you have other concerns.

## 2017-04-24 ENCOUNTER — Ambulatory Visit (INDEPENDENT_AMBULATORY_CARE_PROVIDER_SITE_OTHER): Payer: Medicare HMO | Admitting: Nurse Practitioner

## 2017-04-24 ENCOUNTER — Encounter: Payer: Self-pay | Admitting: Nurse Practitioner

## 2017-04-24 VITALS — BP 136/70 | HR 80 | Temp 98.2°F | Ht 65.0 in | Wt 172.0 lb

## 2017-04-24 DIAGNOSIS — J441 Chronic obstructive pulmonary disease with (acute) exacerbation: Secondary | ICD-10-CM | POA: Diagnosis not present

## 2017-04-24 DIAGNOSIS — R0609 Other forms of dyspnea: Secondary | ICD-10-CM | POA: Diagnosis not present

## 2017-04-24 DIAGNOSIS — R0600 Dyspnea, unspecified: Secondary | ICD-10-CM

## 2017-04-24 DIAGNOSIS — E785 Hyperlipidemia, unspecified: Secondary | ICD-10-CM | POA: Diagnosis not present

## 2017-04-24 DIAGNOSIS — R0789 Other chest pain: Secondary | ICD-10-CM | POA: Diagnosis not present

## 2017-04-24 MED ORDER — PREDNISONE 10 MG (21) PO TBPK: ORAL_TABLET | ORAL | 0 refills | Status: DC

## 2017-04-24 MED ORDER — BUDESONIDE-FORMOTEROL FUMARATE 160-4.5 MCG/ACT IN AERO: 2.0000 | INHALATION_SPRAY | Freq: Two times a day (BID) | RESPIRATORY_TRACT | 12 refills | Status: DC

## 2017-04-24 MED ORDER — BENZONATATE 100 MG PO CAPS: 100.0000 mg | ORAL_CAPSULE | Freq: Three times a day (TID) | ORAL | 0 refills | Status: DC | PRN

## 2017-04-24 MED ORDER — IPRATROPIUM-ALBUTEROL 0.5-2.5 (3) MG/3ML IN SOLN
3.0000 mL | Freq: Once | RESPIRATORY_TRACT | Status: AC
  Administered 2017-04-24: 3 mL via RESPIRATORY_TRACT

## 2017-04-24 MED ORDER — TIOTROPIUM BROMIDE MONOHYDRATE 18 MCG IN CAPS: 18.0000 ug | ORAL_CAPSULE | Freq: Every day | RESPIRATORY_TRACT | 12 refills | Status: DC

## 2017-04-24 MED ORDER — GUAIFENESIN ER 600 MG PO TB12: 600.0000 mg | ORAL_TABLET | Freq: Two times a day (BID) | ORAL | 0 refills | Status: DC | PRN

## 2017-04-24 NOTE — Progress Notes (Signed)
Subjective:  Patient ID: Kaylee Hansen, female    DOB: 02-07-1956  Age: 61 y.o. MRN: [id number]  CC: Hospitalization Follow-up (SOB--weak,felt like cant get enough air. )   Shortness of Breath  This is a new problem. The current episode started in the past 7 days. The problem occurs constantly. The problem has been gradually worsening. Associated symptoms include chest pain, orthopnea, PND, sputum production and wheezing. Pertinent negatives include no leg swelling. The symptoms are aggravated by lying flat and any activity. Risk factors include smoking. She has tried beta agonist inhalers for the symptoms. The treatment provided mild relief. Her past medical history is significant for CAD, chronic lung disease and COPD.  unable to afford anoro inhaler, hence has not used for several months. Using albuterol once a day as needed  Evaluated in ED 04/20/2017 for similar symptoms. Negative cardiac work up, negative Ddimer and no acute finding on CXR.  Outpatient Medications Prior to Visit  Medication Sig Dispense Refill  . albuterol (PROVENTIL HFA;VENTOLIN HFA) 108 (90 Base) MCG/ACT inhaler Inhale 2 puffs into the lungs every 6 (six) hours as needed for wheezing. 1 Inhaler 5  . albuterol (PROVENTIL) (2.5 MG/3ML) 0.083% nebulizer solution Take 3 mLs (2.5 mg total) by nebulization 4 (four) times daily. (Patient taking differently: Take 2.5 mg by nebulization every 6 (six) hours as needed for wheezing or shortness of breath. ) 120 vial 11  . ALPRAZolam (XANAX) 0.25 MG tablet Take 1 tablet (0.25 mg total) by mouth at bedtime as needed for anxiety. 30 tablet 2  . Camphor-Menthol-Methyl Sal (HM SALONPAS PAIN RELIEF EX) Apply 1 application topically at bedtime as needed (pain relief).    . cycloSPORINE (RESTASIS) 0.05 % ophthalmic emulsion Place 1 drop into both eyes 2 (two) times daily. 0.4 mL 2  . HYDROcodone-acetaminophen (NORCO) 10-325 MG tablet Take 1 tablet by mouth every 6 (six) hours as needed  for moderate pain or severe pain. Please fill on or after 05/20/17 120 tablet 0  . Lactulose 20 GM/30ML SOLN 30-90 ml prn constipation 1000 mL 3  . levothyroxine (SYNTHROID, LEVOTHROID) 150 MCG tablet Take 1 tablet (150 mcg total) by mouth daily. 90 tablet 3  . linaclotide (LINZESS) 290 MCG CAPS capsule TAKE ONE CAPSULE BY MOUTH BEFORE BREAKFAST 30 capsule 2  . NON FORMULARY Oxygen- use at night as directed     . pantoprazole (PROTONIX) 40 MG tablet Take 1 tablet (40 mg total) by mouth daily. 90 tablet 3  . PARoxetine (PAXIL) 40 MG tablet TAKE 0.5 tablet BY MOUTH DAILY. 90 tablet 3  . phenytoin (DILANTIN) 100 MG ER capsule 100 mg every morning and 200 mg every night. 270 capsule 3  . promethazine (PHENERGAN) 25 MG tablet Take 1 tablet (25 mg total) by mouth daily. (Patient taking differently: Take 25 mg by mouth every 6 (six) hours as needed for nausea or vomiting. ) 90 tablet 1  . SSD 1 % cream Apply topically daily. Apply to burns 50 g 1  . SUMAtriptan (IMITREX) 100 MG tablet TAKE ONE TABLET BY MOUTH EVERY 2 HOURS AS NEEDED FOR  MIGRAINE  OR  HEADACHE.  MAY  REPEAT  IN  2  HOURS  IF  HEADACHE  PERSISTS  OR  RECUR 9 tablet 2  . traZODone (DESYREL) 100 MG tablet Take 1 tablet (100 mg total) by mouth at bedtime. 90 tablet 3  . Vitamin D, Ergocalciferol, (DRISDOL) 50000 units CAPS capsule Take 1 capsule (50,000 Units total) by mouth  once a week. 12 capsule 3  . umeclidinium-vilanterol (ANORO ELLIPTA) 62.5-25 MCG/INH AEPB Inhale 1 puff into the lungs daily. 1 each 11   No facility-administered medications prior to visit.     ROS See HPI  Objective:  BP 136/70   Pulse 80   Temp 98.2 F (36.8 C)   Ht 5\' 5"  (1.651 m)   Wt 172 lb (78 kg)   SpO2 98%   BMI 28.62 kg/m   BP Readings from Last 3 Encounters:  04/24/17 136/70  04/20/17 (!) 149/75  04/04/17 128/62    Wt Readings from Last 3 Encounters:  04/24/17 172 lb (78 kg)  04/04/17 170 lb (77.1 kg)  01/11/17 169 lb (76.7 kg)     Physical Exam  Constitutional: She is oriented to person, place, and time. No distress.  Cardiovascular: Normal rate and regular rhythm.   Pulmonary/Chest: Effort normal. She has wheezes. She has rales.  Musculoskeletal: She exhibits no edema.  Neurological: She is alert and oriented to person, place, and time.  Vitals reviewed.   Lab Results  Component Value Date   WBC 7.4 04/20/2017   HGB 11.3 (L) 04/20/2017   HCT 35.3 (L) 04/20/2017   PLT 274 04/20/2017   GLUCOSE 98 04/20/2017   CHOL 310 (H) 07/14/2015   TRIG 300.0 (H) 07/14/2015   HDL 38.00 (L) 07/14/2015   LDLDIRECT 187.0 07/14/2015   LDLCALC (H) 05/12/2009    240        Total Cholesterol/HDL:CHD Risk Coronary Heart Disease Risk Table                     Men   Women  1/2 Average Risk   3.4   3.3  Average Risk       5.0   4.4  2 X Average Risk   9.6   7.1  3 X Average Risk  23.4   11.0        Use the calculated Patient Ratio above and the CHD Risk Table to determine the patient's CHD Risk.        ATP III CLASSIFICATION (LDL):  <100     mg/dL   Optimal  100-129  mg/dL   Near or Above                    Optimal  130-159  mg/dL   Borderline  160-189  mg/dL   High  >190     mg/dL   Very High   ALT 11 10/03/2016   AST 13 10/03/2016   NA 140 04/20/2017   K 4.6 04/20/2017   CL 105 04/20/2017   CREATININE 1.03 (H) 04/20/2017   BUN 11 04/20/2017   CO2 29 04/20/2017   TSH 0.46 07/14/2015   INR 1.0 09/16/2008   HGBA1C  09/17/2008    5.0 (NOTE)   The ADA recommends the following therapeutic goal for glycemic   control related to Hgb A1C measurement:   Goal of Therapy:   < 7.0% Hgb A1C   Reference: American Diabetes Association: Clinical Practice   Recommendations 2008, Diabetes Care,  2008, 31:(Suppl 1).    Dg Chest 2 View  Result Date: 04/20/2017 CLINICAL DATA:  Initial evaluation for acute sternal chest pain we shortness of breath. EXAM: CHEST  2 VIEW COMPARISON:  Prior radiograph from 10/03/2016. FINDINGS:  Cardiac and mediastinal silhouettes are stable in size and contour, and remain within normal limits. Lungs are hypoinflated. No focal infiltrates. No pulmonary edema  or pleural effusion. No pneumothorax. Multiple remotely healed bilateral rib fractures noted. No acute osseous abnormality. IMPRESSION: 1. Shallow lung inflation with no radiographic evidence for active cardiopulmonary disease. 2. Multiple remotely healed bilateral rib fractures. Electronically Signed   By: Jeannine Boga M.D.   On: 04/20/2017 15:44    Assessment & Plan:   Chaunta was seen today for hospitalization follow-up.  Diagnoses and all orders for this visit:  COPD with acute exacerbation ([place]) -     ipratropium-albuterol (DUONEB) 0.5-2.5 (3) MG/3ML nebulizer solution 3 mL; Take 3 mLs by nebulization once. -     Ambulatory referral to Pulmonology -     predniSONE (STERAPRED UNI-PAK 21 TAB) 10 MG (21) TBPK tablet; Take by mouth as directed. -     tiotropium (SPIRIVA HANDIHALER) 18 MCG inhalation capsule; Place 1 capsule (18 mcg total) into inhaler and inhale daily. -     guaiFENesin (MUCINEX) 600 MG 12 hr tablet; Take 1 tablet (600 mg total) by mouth 2 (two) times daily as needed for cough or to loosen phlegm. -     benzonatate (TESSALON) 100 MG capsule; Take 1 capsule (100 mg total) by mouth 3 (three) times daily as needed for cough. -     budesonide-formoterol (SYMBICORT) 160-4.5 MCG/ACT inhaler; Inhale 2 puffs into the lungs 2 (two) times daily at 10 AM and 5 PM. Rinse mouth after each use  Dyspnea on exertion -     Ambulatory referral to Cardiology -     Ambulatory referral to Pulmonology  Dyslipidemia -     Ambulatory referral to Cardiology  Atypical chest pain -     Ambulatory referral to Cardiology  Other orders -     Cancel: POCT EXHALED NITRIC OXIDE   I have discontinued Ms. Dorsch's umeclidinium-vilanterol. I am also having her start on predniSONE, tiotropium, guaiFENesin, benzonatate, and  budesonide-formoterol. Additionally, I am having her maintain her NON FORMULARY, promethazine, albuterol, levothyroxine, traZODone, cycloSPORINE, SSD, Vitamin D (Ergocalciferol), pantoprazole, SUMAtriptan, PARoxetine, phenytoin, ALPRAZolam, linaclotide, HYDROcodone-acetaminophen, albuterol, Lactulose, and Camphor-Menthol-Methyl Sal (HM SALONPAS PAIN RELIEF EX). We administered ipratropium-albuterol.  Meds ordered this encounter  Medications  . ipratropium-albuterol (DUONEB) 0.5-2.5 (3) MG/3ML nebulizer solution 3 mL  . predniSONE (STERAPRED UNI-PAK 21 TAB) 10 MG (21) TBPK tablet    Sig: Take by mouth as directed.    Dispense:  21 tablet    Refill:  0    Order Specific Question:   Supervising Provider    Answer:   Cassandria Anger [[id number]]  . tiotropium (SPIRIVA HANDIHALER) 18 MCG inhalation capsule    Sig: Place 1 capsule (18 mcg total) into inhaler and inhale daily.    Dispense:  30 capsule    Refill:  12    Order Specific Question:   Supervising Provider    Answer:   Cassandria Anger [[id number]]  . guaiFENesin (MUCINEX) 600 MG 12 hr tablet    Sig: Take 1 tablet (600 mg total) by mouth 2 (two) times daily as needed for cough or to loosen phlegm.    Dispense:  14 tablet    Refill:  0    Order Specific Question:   Supervising Provider    Answer:   Cassandria Anger [[id number]]  . benzonatate (TESSALON) 100 MG capsule    Sig: Take 1 capsule (100 mg total) by mouth 3 (three) times daily as needed for cough.    Dispense:  20 capsule    Refill:  0    Order Specific Question:  Supervising Provider    Answer:   Cassandria Anger [[id number]]  . budesonide-formoterol (SYMBICORT) 160-4.5 MCG/ACT inhaler    Sig: Inhale 2 puffs into the lungs 2 (two) times daily at 10 AM and 5 PM. Rinse mouth after each use    Dispense:  1 Inhaler    Refill:  12    Order Specific Question:   Supervising Provider    Answer:   Cassandria Anger [[id number]]    Follow-up: Return if symptoms worsen or fail to  improve.  Wilfred Lacy, NP

## 2017-04-24 NOTE — Patient Instructions (Addendum)
Continue current inhalers as prescribed.  Use albuterol three times a day x 3days, then switch to as needed.  You will be contacted to schedule appt with cardiology and pulmonology.

## 2017-04-26 DIAGNOSIS — I352 Nonrheumatic aortic (valve) stenosis with insufficiency: Secondary | ICD-10-CM

## 2017-04-26 HISTORY — DX: Nonrheumatic aortic (valve) stenosis with insufficiency: I35.2

## 2017-04-26 HISTORY — PX: TRANSTHORACIC ECHOCARDIOGRAM: SHX275

## 2017-05-02 ENCOUNTER — Encounter: Payer: Self-pay | Admitting: Internal Medicine

## 2017-05-02 ENCOUNTER — Ambulatory Visit: Payer: Medicare HMO | Admitting: Internal Medicine

## 2017-05-02 VITALS — BP 114/60 | HR 83 | Ht 65.0 in | Wt 169.0 lb

## 2017-05-02 DIAGNOSIS — M179 Osteoarthritis of knee, unspecified: Secondary | ICD-10-CM | POA: Diagnosis not present

## 2017-05-02 DIAGNOSIS — J449 Chronic obstructive pulmonary disease, unspecified: Secondary | ICD-10-CM

## 2017-05-02 DIAGNOSIS — J441 Chronic obstructive pulmonary disease with (acute) exacerbation: Secondary | ICD-10-CM

## 2017-05-02 DIAGNOSIS — R69 Illness, unspecified: Secondary | ICD-10-CM | POA: Diagnosis not present

## 2017-05-02 DIAGNOSIS — F17210 Nicotine dependence, cigarettes, uncomplicated: Secondary | ICD-10-CM | POA: Diagnosis not present

## 2017-05-02 MED ORDER — TIOTROPIUM BROMIDE MONOHYDRATE 2.5 MCG/ACT IN AERS: 2.0000 | INHALATION_SPRAY | Freq: Every day | RESPIRATORY_TRACT | 0 refills | Status: DC

## 2017-05-02 MED ORDER — TIOTROPIUM BROMIDE MONOHYDRATE 2.5 MCG/ACT IN AERS: 2.0000 | INHALATION_SPRAY | Freq: Every day | RESPIRATORY_TRACT | 11 refills | Status: DC

## 2017-05-02 MED ORDER — FAMOTIDINE 20 MG PO TABS: ORAL_TABLET | ORAL | 2 refills | Status: DC

## 2017-05-02 MED ORDER — PANTOPRAZOLE SODIUM 40 MG PO TBEC: 40.0000 mg | DELAYED_RELEASE_TABLET | Freq: Every day | ORAL | 2 refills | Status: DC

## 2017-05-02 MED ORDER — BUDESONIDE-FORMOTEROL FUMARATE 160-4.5 MCG/ACT IN AERO: 2.0000 | INHALATION_SPRAY | Freq: Two times a day (BID) | RESPIRATORY_TRACT | 0 refills | Status: DC

## 2017-05-02 MED ORDER — CEFDINIR 300 MG PO CAPS: 300.0000 mg | ORAL_CAPSULE | Freq: Two times a day (BID) | ORAL | 0 refills | Status: DC

## 2017-05-02 MED ORDER — FLUTTER DEVI: 0 refills | Status: DC

## 2017-05-02 MED ORDER — PREDNISONE 10 MG PO TABS: ORAL_TABLET | ORAL | 0 refills | Status: DC

## 2017-05-02 NOTE — Progress Notes (Signed)
Subjective:     Patient ID: Kaylee Hansen, female   DOB: [birth year], 61 y.o.   MRN: [id number]  HPI  72 yowf active smoker in w/c x 2013 and variable sob in w/c with GOLD O copd on initial pulmonary eval referred to pulmonary clinic 05/02/2017 by NP Kaylee Hansen    05/02/2017 1st [facility] office visit/ Kaylee Hansen   Chief Complaint  Patient presents with  . Pulmonary Consult    Referred by Kaylee Lacy, PA. Pt c/o SOB x 2 wks- out of breath walking any distance. She also c/o cough- prod with green to clear sputum.  She states she feels "squeezing" feeling in her chest.  She is using her albuterol inhaler 2 x daily on average and neb with albuterol 2-5 x per wk.   doe x 5 ft x 5 years Sleeping 2 big pillows / 2.5 lpm with am congestion/ thick green mucus  Last time breathing was better than nl = 4 weeks prior to OV  On  saba only   And  now sob at rest on symb/ spiriva and two different forms of saba > very confused with maint vs prn concept   No obvious day to day or daytime variability or assoc  mucus plugs or hemoptysis or cp  , subjective wheeze or overt sinus or hb symptoms. No unusual exp hx or h/o childhood pna/ asthma or knowledge of premature birth.  Sleeping ok on 2.5 lpm and 2 large pillows  without nocturnal   exacerbation  of respiratory  c/o's or need for noct saba. Also denies any obvious fluctuation of symptoms with weather or environmental changes or other aggravating or alleviating factors except as outlined above   Current Allergies, Complete Past Medical History, Past Surgical History, Family History, and Social History were reviewed in [employer] record.  ROS  The following are not active complaints unless bolded Hoarseness, sore throat, dysphagia, dental problems, itching, sneezing,  nasal congestion or discharge of excess mucus or purulent secretions, ear ache,   fever, chills, sweats, unintended wt loss or wt gain, classically pleuritic or  exertional cp,  orthopnea pnd or leg swelling, presyncope, palpitations, abdominal pain, anorexia, nausea, vomiting, diarrhea  or change in bowel habits or change in bladder habits, change in stools or change in urine, dysuria, hematuria,  rash, arthralgias, visual complaints, headache, numbness, weakness or ataxia or problems with walking due to hips/knees  or coordination,  change in mood/affect or memory.        Current Meds  Medication Sig  . albuterol (PROVENTIL HFA;VENTOLIN HFA) 108 (90 Base) MCG/ACT inhaler Inhale 2 puffs into the lungs every 6 (six) hours as needed for wheezing.  [name] albuterol (PROVENTIL) (2.5 MG/3ML) 0.083% nebulizer solution Take 3 mLs (2.5 mg total) by nebulization 4 (four) times daily. (Patient taking differently: Take 2.5 mg by nebulization every 6 (six) hours as needed for wheezing or shortness of breath. )  . ALPRAZolam (XANAX) 0.25 MG tablet Take 1 tablet (0.25 mg total) by mouth at bedtime as needed for anxiety.  . benzonatate (TESSALON) 100 MG capsule Take 1 capsule (100 mg total) by mouth 3 (three) times daily as needed for cough.  . budesonide-formoterol (SYMBICORT) 160-4.5 MCG/ACT inhaler Inhale 2 puffs 2 (two) times daily at 10 AM and 5 PM into the lungs. Rinse mouth after each use  . Camphor-Menthol-Methyl Sal (HM SALONPAS PAIN RELIEF EX) Apply 1 application topically at bedtime as needed (pain relief).  . cycloSPORINE (RESTASIS)  0.05 % ophthalmic emulsion Place 1 drop into both eyes 2 (two) times daily.  [name] guaiFENesin (MUCINEX) 600 MG 12 hr tablet Take 1 tablet (600 mg total) by mouth 2 (two) times daily as needed for cough or to loosen phlegm.  [name] HYDROcodone-acetaminophen (NORCO) 10-325 MG tablet Take 1 tablet by mouth every 6 (six) hours as needed for moderate pain or severe pain. Please fill on or after 05/20/17  . levothyroxine (SYNTHROID, LEVOTHROID) 150 MCG tablet Take 1 tablet (150 mcg total) by mouth daily.  [name] linaclotide (LINZESS) 290 MCG CAPS capsule  TAKE ONE CAPSULE BY MOUTH BEFORE BREAKFAST  . NON FORMULARY 2.5 lpm with sleep only  . PARoxetine (PAXIL) 40 MG tablet TAKE 0.5 tablet BY MOUTH DAILY.  [name] phenytoin (DILANTIN) 100 MG ER capsule 100 mg every morning and 200 mg every night.  . promethazine (PHENERGAN) 25 MG tablet Take 1 tablet (25 mg total) by mouth daily. (Patient taking differently: Take 25 mg by mouth every 6 (six) hours as needed for nausea or vomiting. )  . SSD 1 % cream Apply topically daily. Apply to burns  . SUMAtriptan (IMITREX) 100 MG tablet TAKE ONE TABLET BY MOUTH EVERY 2 HOURS AS NEEDED FOR  MIGRAINE  OR  HEADACHE.  MAY  REPEAT  IN  2  HOURS  IF  HEADACHE  PERSISTS  OR  RECUR  . traZODone (DESYREL) 100 MG tablet Take 1 tablet (100 mg total) by mouth at bedtime.  . Vitamin D, Ergocalciferol, (DRISDOL) 50000 units CAPS capsule Take 1 capsule (50,000 Units total) by mouth once a week.  . [  budesonide-formoterol (SYMBICORT) 160-4.5 MCG/ACT inhaler Inhale 2 puffs into the lungs 2 (two) times daily at 10 AM and 5 PM. Rinse mouth after each use  . [ ]  tiotropium (SPIRIVA HANDIHALER) 18 MCG inhalation capsule Place 1 capsule (18 mcg total) into inhaler and inhale daily.     Review of Systems     Objective:   Physical Exam   W/c bound elderly wf > stated age  nad   Wt Readings from Last 3 Encounters:  05/02/17 169 lb (76.7 kg)  04/24/17 172 lb (78 kg)  04/04/17 170 lb (77.1 kg)    Vital signs reviewed - Note on arrival 02 sats  96% on RA     HEENT: nl  turbinates bilaterally, and oropharynx. Nl external ear canals without cough reflex - edentulous with dentures in place.   NECK :  without JVD/Nodes/TM/ nl carotid upstrokes bilaterally   LUNGS: no acc muscle use,  Nl contour chest with early bilateral exp sonorous rhonchi/ coughing fits better with plm    CV:  RRR  no s3 or murmur or increase in P2, and no edema   ABD:  soft and nontender with nl inspiratory excursion in the supine position. No bruits or  organomegaly appreciated, bowel sounds nl  MS:   ext warm without deformities, calf tenderness, cyanosis or clubbing No obvious joint restrictions   SKIN: warm and dry without lesions    NEURO:  alert, approp, nl sensorium with  no motor or cerebellar deficits apparent.      I personally reviewed images and agree with radiology impression as follows:  CXR:   04/20/17 1. Shallow lung inflation with no radiographic evidence for active cardiopulmonary disease. 2. Multiple remotely healed bilateral rib fractures      Assessment:

## 2017-05-02 NOTE — Patient Instructions (Addendum)
Plan A = Automatic = Symbicort 160 Take 2 puffs first thing in am and then another 2 puffs about 12 hours later spiriva 2 puffs each am   Work on inhaler technique:  relax and gently blow all the way out then take a nice smooth deep breath back in, triggering the inhaler at same time you start breathing in.  Hold for up to 5 seconds if you can. Blow out thru nose. Rinse and gargle with water when done     Plan B = Backup Only use your albuterol as a rescue medication to be used if you can't catch your breath by resting or doing a relaxed purse lip breathing pattern.  - The less you use it, the better it will work when you need it. - Ok to use the inhaler up to 2 puffs  every 4 hours if you must but call for appointment if use goes up over your usual need - Don't leave home without it !!  (think of it like the spare tire for your car)   Plan C = Crisis - only use your albuterol nebulizer if you first try Plan B and it fails to help > ok to use the nebulizer up to every 4 hours but if start needing it regularly call for immediate appointment  Prednisone 10 mg take  4 each am x 2 days,   2 each am x 2 days,  1 each am x 2 days and stop   Augmentin 875 mg take one pill twice daily  X 10 days - take at breakfast and supper with large glass of water.  It would help reduce the usual side effects (diarrhea and yeast infections) if you ate cultured yogurt at lunch.   For cough > mucinex or mucinex dm 1200 mg every 12 hours and use the flutter valve as much as possible    Pantoprazole (protonix) 40 mg   Take  30-60 min before first meal of the day and Pepcid (famotidine)  20 mg one @  bedtime until return to office - this is the best way to tell whether stomach acid is contributing to your problem.    GERD (REFLUX)  is an extremely common cause of respiratory symptoms just like yours , many times with no obvious heartburn at all.    It can be treated with medication, but also with lifestyle changes  including elevation of the head of your bed (ideally with 6 inch  bed blocks),  Smoking cessation, avoidance of late meals, excessive alcohol, and avoid fatty foods, chocolate, peppermint, colas, red wine, and acidic juices such as orange juice.  NO MINT OR MENTHOL PRODUCTS SO NO COUGH DROPS   USE SUGARLESS CANDY INSTEAD (Jolley ranchers or Stover's or Life Savers) or even ice chips will also do - the key is to swallow to prevent all throat clearing. NO OIL BASED VITAMINS - use powdered substitutes.    The key is to stop smoking completely before smoking completely stops you!   Please schedule a follow up office visit in 4 weeks, sooner if needed with pfts and bring all meds in 2 bags, your automatics vs your as needed

## 2017-05-03 ENCOUNTER — Encounter: Payer: Self-pay | Admitting: Internal Medicine

## 2017-05-03 NOTE — Assessment & Plan Note (Signed)

## 2017-05-03 NOTE — Assessment & Plan Note (Addendum)
Spirometry 05/02/2017  FEV1 0.96 (37%)  Ratio 78 with atypical curvature  - 05/02/2017  After extensive coaching HFA effectiveness =  75% from a baseline of 25%   At this point not really clear she has much copd at all but does have airway symptoms that are difficult to control.  DDX of  difficult airways management almost all start with A and  include Adherence, Ace Inhibitors, Acid Reflux, Active Sinus Disease, Alpha 1 Antitripsin deficiency, Anxiety masquerading as Airways dz,  ABPA,  Allergy(esp in young), Aspiration (esp in elderly), Adverse effects of meds,  Active smokers, A bunch of PE's (a small clot burden can't cause this syndrome unless there is already severe underlying pulm or vascular dz with poor reserve) plus two Bs  = Bronchiectasis and Beta blocker use..and one C= CHF  Adherence is always the initial "prime suspect" and is a multilayered concern that requires a "trust but verify" approach in every patient - starting with knowing how to use medications, especially inhalers, correctly, keeping up with refills and understanding the fundamental difference between maintenance and prns vs those medications only taken for a very short course and then stopped and not refilled.  - see hfa teaching - return with all meds in hand using a trust but verify approach to confirm accurate Medication  Reconciliation The principal here is that until we are certain that the  patients are doing what we've asked, it makes no sense to ask them to do more.    Active smoking greatest concern  (see separate a/p)   ? Active sinus dz > omnicef 300 mg  one pill twice daily  X 10 days   ? Allergy > Prednisone 10 mg take  4 each am x 2 days,   2 each am x 2 days,  1 each am x 2 days and stop  ? Adverse effects of dpi > d/c spiriva dpi and change to spiriva respimat    ? Acid (or non-acid) GERD > always difficult to exclude as up to 75% of pts in some series report no assoc GI/ Heartburn symptoms> rec max (24h)   acid suppression and diet restrictions/ reviewed and instructions given in writing.    ? Anxiety > usually at the bottom of this list of usual suspects but should be much higher on this pt's based on H and P and note already on psychotropics .    Total time devoted to counseling  > 50 % of initial 60 min office visit:  review case with pt/husband discussion of options/alternatives/ personally creating written customized instructions  in presence of pt  then going over those specific  Instructions directly with the pt including how to use all of the meds but in particular covering each new medication in detail and the difference between the maintenance= "automatic" meds and the prns using an action plan format for the latter (If this problem/symptom => do that organization reading Left to right).  Please see AVS from this visit for a full list of these instructions which I personally wrote for this pt and  are unique to this visit.

## 2017-05-04 ENCOUNTER — Ambulatory Visit: Payer: Medicare HMO | Admitting: Cardiology

## 2017-05-04 ENCOUNTER — Encounter: Payer: Self-pay | Admitting: Cardiology

## 2017-05-04 ENCOUNTER — Encounter: Payer: Self-pay | Admitting: *Deleted

## 2017-05-04 VITALS — BP 136/72 | HR 89 | Ht 65.0 in | Wt 172.0 lb

## 2017-05-04 DIAGNOSIS — Z01818 Encounter for other preprocedural examination: Secondary | ICD-10-CM

## 2017-05-04 DIAGNOSIS — Z79899 Other long term (current) drug therapy: Secondary | ICD-10-CM | POA: Diagnosis not present

## 2017-05-04 DIAGNOSIS — R079 Chest pain, unspecified: Secondary | ICD-10-CM

## 2017-05-04 DIAGNOSIS — I10 Essential (primary) hypertension: Secondary | ICD-10-CM | POA: Diagnosis not present

## 2017-05-04 DIAGNOSIS — E785 Hyperlipidemia, unspecified: Secondary | ICD-10-CM | POA: Diagnosis not present

## 2017-05-04 DIAGNOSIS — R011 Cardiac murmur, unspecified: Secondary | ICD-10-CM | POA: Diagnosis not present

## 2017-05-04 MED ORDER — METOPROLOL TARTRATE 50 MG PO TABS: 50.0000 mg | ORAL_TABLET | Freq: Once | ORAL | 0 refills | Status: DC

## 2017-05-04 NOTE — Patient Instructions (Addendum)
LABS--[facility]-- DO NOT EAT OR DRINKT THE MORNING OF THE TEST LIPID BMP HEPATIC   SCHEDULE AT [place] [facility] Your physician has requested that you have an echocardiogram. Echocardiography is a painless test that uses sound waves to create images of your heart. It provides your doctor with information about the size and shape of your heart and how well your heart's chambers and valves are working. This procedure takes approximately one hour. There are no restrictions for this procedure.    SCHEDULE AT [facility] Your physician has requested that you have cardiac CT. Cardiac computed tomography (CT) is a painless test that uses an x-ray machine to take clear, detailed pictures of your heart. For further information please visit [facility]uge[facility]iesta[facility]tn. Please follow instruction sheet as given.PLEASE HAVE LAB WORK DONE AT LEAST ONE WEEK PRIOR TO TEST , AN PICK UP METOPROLOL FROM THE PHARMACY   Your physician recommends that you schedule a follow-up appointment in 2 MONTHS WITH DR HARDING.     Please arrive at the [facility] main entrance of [facility] at (30-45 minutes prior to test start time)  [facility] [address] [place], [place] [address] [phone]  Proceed to the [facility] Radiology Department (First Floor).  Please follow these instructions carefully (unless otherwise directed):   On the Night Before the Test: . Drink plenty of water. . Do not consume any caffeinated/decaffeinated beverages or chocolate 12 hours prior to your test. . Do not take any antihistamines 12 hours prior to your test. . If you take Metformin do not take 24 hours prior to test.   On the Day of the Test: . Drink plenty of water. Do not drink any water within one hour of the test. . Do not eat any food 4 hours prior to the test. . You may take your regular medications prior to the test. . IF NOT ON A BETA BLOCKER - Take 50 mg of lopressor  (metoprolol) one hour before the test. . HOLD Furosemide morning of the test.  After the Test: . Drink plenty of water. . After receiving IV contrast, you may experience a mild flushed feeling. This is normal. . On occasion, you may experience a mild rash up to 24 hours after the test. This is not dangerous. If this occurs, you can take Benadryl 25 mg and increase your fluid intake. . If you experience trouble breathing, this can be serious. If it is severe call 911 IMMEDIATELY. If it is mild, please call our office.

## 2017-05-04 NOTE — Progress Notes (Signed)
PCP: Plotnikov, [name], MD  Clinic Note: Chief Complaint  Patient presents with  . New Patient (Initial Visit)  . Chest Pain  . Shortness of Breath  . Heart Murmur    HPI: Kaylee Hansen is a 61 y.o. female -long-term smoker who has significant COPD,.  She is being seen today for the evaluation of evaluation of tightness in the chest at the request of [name], Charlene Brooke, NP. Recent Hospitalizations:   ER 3 wks ago - sob,chest tightness troponin was negative, BNP was only 61.7--    Kaylee Hansen was last seen on November 7 by Dr. Melvyn Novas (PCCM)   PCCM 05/02/17 Dr. Melvyn Novas - started on Inhalers, now on Cefdinir, prednisone  Active smoking greatest concern (see separate a/p)   ? Active sinus dz > omnicef 300 mg one pill twice daily X 10 days   ? Allergy > Prednisone 10 mg take 4 each am x 2 days, 2 each am x 2 days, 1 each am x 2 days and stop   ? Adverse effects of dpi > d/c spiriva dpi and change to spiriva respimat   ? Acid (or non-acid) GERD > always difficult to exclude as up to 75% of pts in some series report no assoc GI/ Heartburn symptoms> rec max (24h) acid suppression and diet restrictions/ reviewed and instructions given in writing  Studies Personally Reviewed - (if available, images/films reviewed: From Epic Chart or Care Everywhere)  *Myoview 2003 -dobutamine Myoview.  Nonischemic.  EF 55-60%.  2D echo March 2010: Normal LV size and function.  EF 55-65%.  No regional wall motion normality.  No aortic stenosis.  Moderate AI.  Moderate MAC with mild MR  Interval History: He is a chronically ill-appearing woman who is pretty much wheelchair-bound due to chronic myalgia arthralgia type pains.  She is a chronic smoker with obvious baseline dyspnea.  She is coughing frequently during the examination.  She is noticeably dyspneic to simply with any type of conversation.  She says that she will have episodes of profound dyspnea with chest tightness that come and go.  She does  not exert herself, so all these symptoms are at rest.  She has mild baseline edema, and cannot sleep lying flat (not sure if this is true orthopnea.  She does not have real PND, but has to sit up to sleep).  She does not have the strength to get up to go to the bathroom. No palpitations, lightheadedness, dizziness.  No syncope/near syncope. No TIA/amaurosis fugax symptoms. No melena, hematochezia, hematuria, or epstaxis. No claudication.  ROS: A comprehensive was performed. Review of Systems  Constitutional: Positive for malaise/fatigue (Chronic). Negative for chills, fever and weight loss.  HENT: Positive for congestion and sore throat (Hoarse voice). Negative for hearing loss.   Respiratory: Positive for cough (Chronic, but now more frequent), sputum production (Not purulent), shortness of breath and wheezing.   Gastrointestinal: Positive for abdominal pain, constipation, diarrhea and heartburn. Negative for blood in stool and melena.       She has some type of chronic colitis with intermittent abdominal pain and constipation.  Now she is having diarrhea.  Genitourinary: Negative for dysuria, frequency and hematuria.  Musculoskeletal: Positive for back pain, falls (She does not stand up without assistance), joint pain and myalgias.       Chronic profound hip and knee pain presumably from arthritis.  Does not have strength to stand on her own.  Neurological: Positive for dizziness, weakness and headaches (  With congestion). Negative for focal weakness and loss of consciousness.  Psychiatric/Behavioral: Positive for depression. Negative for memory loss. The patient is nervous/anxious and has insomnia.   All other systems reviewed and are negative.  I have reviewed and (if needed) personally updated the patient's problem list, medications, allergies, past medical and surgical history, social and family history.   Past Medical History:  Diagnosis Date  . Anxiety   . Collagenous colitis   .  Colon adenomas 2011  . Constipation    Chronic abdominal pain and constipation  . COPD (chronic obstructive pulmonary disease) ([place])   . Depression   . Diverticulosis   . Esophageal stricture 11/08/2012  . Esophageal ulcer    04/2009 EGD  . Esophageal ulcer 04/2009  . GERD (gastroesophageal reflux disease)   . Helicobacter pylori gastritis 2010   Pylera Tx  . History of cholecystectomy   . Hx of appendectomy   . Hx of cancer of lung 1999  . Hx of hysterectomy   . Hyperlipidemia   . Hypothyroidism   . Low back pain   . Osteoarthritis of knee    bilateral knee  . Seizures ([place])   . Stroke [facility])    CVA, hx of 97  . 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 ABDOMINAL HYSTERECTOMY     complete 1992  . ABDOMINAL SURGERY     Exploratory  . APPENDECTOMY    . CHOLECYSTECTOMY    . COLONOSCOPY W/ BIOPSIES     multiple  . ESOPHAGOGASTRODUODENOSCOPY     w/baloon x 2  . TOTAL HIP ARTHROPLASTY     Left    Current Meds  Medication Sig  . albuterol (PROVENTIL HFA;VENTOLIN HFA) 108 (90 Base) MCG/ACT inhaler Inhale 2 puffs into the lungs every 6 (six) hours as needed for wheezing.  [name] albuterol (PROVENTIL) (2.5 MG/3ML) 0.083% nebulizer solution Take 3 mLs (2.5 mg total) by nebulization 4 (four) times daily. (Patient taking differently: Take 2.5 mg by nebulization every 6 (six) hours as needed for wheezing or shortness of breath. )  . ALPRAZolam (XANAX) 0.25 MG tablet Take 1 tablet (0.25 mg total) by mouth at bedtime as needed for anxiety.  . benzonatate (TESSALON) 100 MG capsule Take 1 capsule (100 mg total) by mouth 3 (three) times daily as needed for cough.  . budesonide-formoterol (SYMBICORT) 160-4.5 MCG/ACT inhaler Inhale 2 puffs 2 (two) times daily at 10 AM and 5 PM into the lungs. Rinse mouth after each use  . Camphor-Menthol-Methyl Sal (HM SALONPAS PAIN RELIEF EX) Apply 1 application topically at bedtime as needed (pain relief).  . cefdinir  (OMNICEF) 300 MG capsule Take 1 capsule (300 mg total) 2 (two) times daily by mouth.  . cycloSPORINE (RESTASIS) 0.05 % ophthalmic emulsion Place 1 drop into both eyes 2 (two) times daily.  . famotidine (PEPCID) 20 MG tablet One at bedtime  . guaiFENesin (MUCINEX) 600 MG 12 hr tablet Take 1 tablet (600 mg total) by mouth 2 (two) times daily as needed for cough or to loosen phlegm.  [name] HYDROcodone-acetaminophen (NORCO) 10-325 MG tablet Take 1 tablet by mouth every 6 (six) hours as needed for moderate pain or severe pain. Please fill on or after 05/20/17  . levothyroxine (SYNTHROID, LEVOTHROID) 150 MCG tablet Take 1 tablet (150 mcg total) by mouth daily.  [name] linaclotide (LINZESS) 290 MCG CAPS capsule TAKE ONE CAPSULE BY MOUTH BEFORE BREAKFAST  . NON FORMULARY 2.5 lpm with sleep  only  . pantoprazole (PROTONIX) 40 MG tablet Take 1 tablet (40 mg total) daily by mouth. Take 30-60 min before first meal of the day  . PARoxetine (PAXIL) 40 MG tablet TAKE 0.5 tablet BY MOUTH DAILY.  [name] phenytoin (DILANTIN) 100 MG ER capsule 100 mg every morning and 200 mg every night.  . predniSONE (DELTASONE) 10 MG tablet Take  4 each am x 2 days,   2 each am x 2 days,  1 each am x 2 days and stop  . promethazine (PHENERGAN) 25 MG tablet Take 1 tablet (25 mg total) by mouth daily. (Patient taking differently: Take 25 mg by mouth every 6 (six) hours as needed for nausea or vomiting. )  . Respiratory Therapy Supplies (FLUTTER) DEVI As directed  . SSD 1 % cream Apply topically daily. Apply to burns  . SUMAtriptan (IMITREX) 100 MG tablet TAKE ONE TABLET BY MOUTH EVERY 2 HOURS AS NEEDED FOR  MIGRAINE  OR  HEADACHE.  MAY  REPEAT  IN  2  HOURS  IF  HEADACHE  PERSISTS  OR  RECUR  . Tiotropium Bromide Monohydrate (SPIRIVA RESPIMAT) 2.5 MCG/ACT AERS Inhale 2 puffs daily into the lungs.  . traZODone (DESYREL) 100 MG tablet Take 1 tablet (100 mg total) by mouth at bedtime.  . Vitamin D, Ergocalciferol, (DRISDOL) 50000 units CAPS capsule  Take 1 capsule (50,000 Units total) by mouth once a week.    Allergies  Allergen Reactions  . Morphine     "Makes me go crazy."   . Oxycodone Hcl Other (See Comments)    Knocked her out for 6 days  . Penicillins Hives    Has patient had a PCN reaction causing immediate rash, facial/tongue/throat swelling, SOB or lightheadedness with hypotension: unknown Has patient had a PCN reaction causing severe rash involving mucus membranes or skin necrosis: unknown Has patient had a PCN reaction that required hospitalization No Has patient had a PCN reaction occurring within the last 10 years: No If all of the above answers are "NO", then may proceed with Cephalosporin use.   . Tape Other (See Comments)    Skin turns red and burns    Social History   Socioeconomic History  . Marital status: Married    Spouse name: None  . Number of children: 2  . Years of education: None  . Highest education level: None  Social Needs  . Financial resource strain: None  . Food insecurity - worry: None  . Food insecurity - inability: None  . Transportation needs - medical: None  . Transportation needs - non-medical: None  Occupational History  . Occupation: disabled    Employer: DISABLED  Tobacco Use  . Smoking status: Current Every Day Smoker    Packs/day: 1.00    Years: 48.00    Pack years: 48.00    Types: Cigarettes  . Smokeless tobacco: Never Used  Substance and Sexual Activity  . Alcohol use: No  . Drug use: No  . Sexual activity: Not Currently  Other Topics Concern  . None  Social History Narrative   Married   2 children   No regular exercise          family history includes Coronary artery disease in her mother and other; Diabetes in her mother and son; Hypertension in her father; Kidney cancer in her sister; Kidney disease in her other.  Wt Readings from Last 3 Encounters:  05/04/17 172 lb (78 kg)  05/02/17 169 lb (76.7 kg)  04/24/17 172 lb (78 kg)    PHYSICAL EXAM BP  136/72   Pulse 89   Ht _0  (1.651 m)   Wt 172 lb (78 kg)   SpO2 97%   BMI 28.62 kg/m  Physical Exam  Constitutional: She is oriented to person, place, and time. No distress (Chronically ill-appearing).  looks older than her stated age.  She is in a wheelchair, profoundly weak with increased work of breathing.  He is coughing frequently.  She is well-groomed and well-nourished.  No acute distress  HENT:  Head: Normocephalic and atraumatic.  Mouth/Throat: Oropharyngeal exudate present.  Eyes: Conjunctivae and EOM are normal. Pupils are equal, round, and reactive to light. No scleral icterus.  Neck: Normal range of motion. Neck supple. No JVD present. Carotid bruit is present (Left> right). No tracheal deviation present. No thyromegaly present.  Cardiovascular: Normal rate and regular rhythm.  Occasional extrasystoles are present. PMI is not displaced. Exam reveals distant heart sounds and decreased pulses (Decreased bilateral pedal pulses). Exam reveals no gallop and no friction rub.  Murmur heard. High-pitched harsh crescendo-decrescendo midsystolic murmur is present with a grade of 2/6 at the upper right sternal border radiating to the neck.  Low-pitched blowing plateau holodiastolic murmur is present with a grade of 2/6 at the lower left sternal border. Pulmonary/Chest: She has no rales.  Baseline increased work of breathing with accessory muscle use.  She almost seems to have respiratory distress simply with having a conversation.  She has to stop several times in the conversation to take deep breaths and catch her breath.  Audible wheezing and rhonchi.  Abdominal: Soft. Bowel sounds are normal. She exhibits no distension. There is tenderness (Diffuse mild tenderness). There is no rebound and no guarding.  Musculoskeletal: She exhibits edema (Trivial).  Normal hand grip strength.  Wheelchair-bound  Lymphadenopathy:    She has no cervical adenopathy.  Neurological: She is alert and  oriented to person, place, and time. No cranial nerve deficit.  Constant mouth smacking, clicking  Skin: Skin is warm and dry. No rash noted. She is not diaphoretic. No erythema.  Psychiatric:  Difficult to assess because of her frequent coughing and breathing issues.  Seems to be somewhat depressed in mood but otherwise has normal judgment and behavior.    Adult ECG Report Not checked  Other studies Reviewed: Additional studies/ records that were reviewed today include:  Recent Labs:   Lab Results  Component Value Date   CREATININE 1.03 (H) 04/20/2017   BUN 11 04/20/2017   NA 140 04/20/2017   K 4.6 04/20/2017   CL 105 04/20/2017   CO2 29 04/20/2017   Lab Results  Component Value Date   CHOL 310 (H) 07/14/2015   HDL 38.00 (L) 07/14/2015   LDLCALC (H) - 240 05/12/2009   LDLDIRECT 187.0 07/14/2015   TRIG 300.0 (H) 07/14/2015   CHOLHDL 8 07/14/2015     ASSESSMENT / PLAN: Problem List Items Addressed This Visit    Cardiac murmur    She actually has a systolic and a diastolic murmur which is probably aortic valve disease with stenosis and regurgitation.  Her Echo in 2010 showed no aortic stenosis, but did show some aortic insufficiency.  We will recheck a 2D Echo to see if this has worsened.      Relevant Orders   ECHOCARDIOGRAM COMPLETE   Basic metabolic panel   Chest pain with moderate risk for cardiac etiology (Chronic)    She was evaluated in the past  with a Myoview that was negative.  I am concerned with her smoking history and her lipids that she has a high likelihood of having multivessel disease and therefore a Myoview may be misleading if negative, or could but be potentially falsely positive because of lung disease. I suspect the chest tightness is probably related to her lung disease and not cardiac, however we need to exclude ischemia For anatomic and possible physiologic evaluation, we will check coronary CTA with possible FFR.  Further management based on those  results.        Relevant Orders   ECHOCARDIOGRAM COMPLETE   CT CORONARY MORPH W/CTA COR W/SCORE W/CA W/CM &/OR WO/CM   CT CORONARY FRACTIONAL FLOW RESERVE DATA PREP   CT CORONARY FRACTIONAL FLOW RESERVE FLUID ANALYSIS   Lipid panel   Basic metabolic panel   Hepatic function panel   Essential hypertension, benign (Chronic)    Actually blood pressure looks pretty well controlled.  She is not actually on any medications.      Hyperlipidemia with target LDL less than 100 - Primary (Chronic)    Unfortunately I do not have any labs since last January.  But her lipids did not look well controlled at that time.  Depending on what are cardiac evaluation shows, she probably needs better control.      Relevant Orders   Lipid panel   Basic metabolic panel   Hepatic function panel    Other Visit Diagnoses    Pre-op testing       Relevant Orders   Basic metabolic panel   Medication management       Relevant Orders   Lipid panel   Basic metabolic panel   Hepatic function panel      Current medicines are reviewed at length with the patient today. (+/- concerns) n/a The following changes have been made: n/a  Patient Instructions  LABS[facility]-- DO NOT EAT OR DRINKT THE MORNING OF THE TEST LIPID BMP HEPATIC   SCHEDULE AT [place] [facility] Your physician has requested that you have an echocardiogram. Echocardiography is a painless test that uses sound waves to create images of your heart. It provides your doctor with information about the size and shape of your heart and how well your heart's chambers and valves are working. This procedure takes approximately one hour. There are no restrictions for this procedure.    SCHEDULE AT [facility] Your physician has requested that you have cardiac CT. Cardiac computed tomography (CT) is a painless test that uses an x-ray machine to take clear, detailed pictures of your heart. For further information please visit  [facility]uge[facility]iest[facility][email or web address]. Please follow instruction sheet as given.PLEASE HAVE LAB WORK DONE AT LEAST ONE WEEK PRIOR TO TEST , AN PICK UP METOPROLOL FROM THE PHARMACY   Your physician recommends that you schedule a follow-up appointment in 2 MONTHS WITH DR Akshara Blumenthal.  Studies Ordered:   Orders Placed This Encounter  Procedures  . CT CORONARY MORPH W/CTA COR W/SCORE W/CA W/CM &/OR WO/CM  . CT CORONARY FRACTIONAL FLOW RESERVE DATA PREP  . CT CORONARY FRACTIONAL FLOW RESERVE FLUID ANALYSIS  . Lipid panel  . Basic metabolic panel  . Hepatic function panel  . ECHOCARDIOGRAM COMPLETE      [name], M.D., M.S. Interventional Cardiologist   Pager # [phone] Phone # [phone] [address] [place], [place] [address]

## 2017-05-07 ENCOUNTER — Encounter: Payer: Self-pay | Admitting: Cardiology

## 2017-05-07 NOTE — Assessment & Plan Note (Signed)
Unfortunately I do not have any labs since last January.  But her lipids did not look well controlled at that time.  Depending on what are cardiac evaluation shows, she probably needs better control.

## 2017-05-07 NOTE — Assessment & Plan Note (Signed)
Actually blood pressure looks pretty well controlled.  She is not actually on any medications.

## 2017-05-07 NOTE — Assessment & Plan Note (Signed)
She was evaluated in the past with a Myoview that was negative.  I am concerned with her smoking history and her lipids that she has a high likelihood of having multivessel disease and therefore a Myoview may be misleading if negative, or could but be potentially falsely positive because of lung disease. I suspect the chest tightness is probably related to her lung disease and not cardiac, however we need to exclude ischemia For anatomic and possible physiologic evaluation, we will check coronary CTA with possible FFR.  Further management based on those results.

## 2017-05-07 NOTE — Assessment & Plan Note (Signed)
She actually has a systolic and a diastolic murmur which is probably aortic valve disease with stenosis and regurgitation.  Her Echo in 2010 showed no aortic stenosis, but did show some aortic insufficiency.  We will recheck a 2D Echo to see if this has worsened.

## 2017-05-15 ENCOUNTER — Other Ambulatory Visit: Payer: Self-pay

## 2017-05-15 ENCOUNTER — Ambulatory Visit (HOSPITAL_COMMUNITY): Payer: Medicare HMO | Attending: Cardiology

## 2017-05-15 DIAGNOSIS — R011 Cardiac murmur, unspecified: Secondary | ICD-10-CM | POA: Diagnosis not present

## 2017-05-15 DIAGNOSIS — I5030 Unspecified diastolic (congestive) heart failure: Secondary | ICD-10-CM | POA: Insufficient documentation

## 2017-05-15 DIAGNOSIS — I080 Rheumatic disorders of both mitral and aortic valves: Secondary | ICD-10-CM | POA: Diagnosis not present

## 2017-05-15 DIAGNOSIS — R079 Chest pain, unspecified: Secondary | ICD-10-CM

## 2017-05-19 DIAGNOSIS — C3490 Malignant neoplasm of unspecified part of unspecified bronchus or lung: Secondary | ICD-10-CM | POA: Diagnosis not present

## 2017-05-22 ENCOUNTER — Telehealth: Payer: Self-pay | Admitting: Cardiology

## 2017-05-22 NOTE — Telephone Encounter (Signed)
Pt would like her Echo results from 05-15-17 please.

## 2017-05-22 NOTE — Telephone Encounter (Signed)
Echocardiogram results: Left ventricle pump function looks well-preserved at 60-65% with not unexpected abnormal relaxation. There is evidence of elevated filling pressures. The aortic valve is thickened and has evidence of mild stenosis (narrowing, but has severe regurgitation which is progression of disease from 2010) based on--consensus recommendations, we need to follow this with an echocardiogram every year. At present, the left ventricle size and function is still preserved enough to consider this being a somewhat compensated left ventricle.  We can discuss follow-up plan at next visit.\\  [name], MD  Pt notified of result above and will discuss further at upcoming appt

## 2017-05-26 HISTORY — PX: CT CTA CORONARY W/CA SCORE W/CM &/OR WO/CM: HXRAD787

## 2017-05-29 ENCOUNTER — Ambulatory Visit: Payer: Medicare HMO | Admitting: Cardiology

## 2017-05-29 ENCOUNTER — Telehealth: Payer: Self-pay | Admitting: *Deleted

## 2017-05-29 VITALS — BP 138/60 | HR 80 | Ht 65.0 in | Wt 174.0 lb

## 2017-05-29 DIAGNOSIS — I351 Nonrheumatic aortic (valve) insufficiency: Secondary | ICD-10-CM | POA: Insufficient documentation

## 2017-05-29 DIAGNOSIS — E785 Hyperlipidemia, unspecified: Secondary | ICD-10-CM | POA: Diagnosis not present

## 2017-05-29 DIAGNOSIS — F17210 Nicotine dependence, cigarettes, uncomplicated: Secondary | ICD-10-CM

## 2017-05-29 DIAGNOSIS — I10 Essential (primary) hypertension: Secondary | ICD-10-CM | POA: Diagnosis not present

## 2017-05-29 DIAGNOSIS — R69 Illness, unspecified: Secondary | ICD-10-CM | POA: Diagnosis not present

## 2017-05-29 DIAGNOSIS — R079 Chest pain, unspecified: Secondary | ICD-10-CM | POA: Diagnosis not present

## 2017-05-29 MED ORDER — NITROGLYCERIN 0.4 MG SL SUBL: 0.4000 mg | SUBLINGUAL_TABLET | SUBLINGUAL | 5 refills | Status: DC | PRN

## 2017-05-29 MED ORDER — FUROSEMIDE 20 MG PO TABS
20.0000 mg | ORAL_TABLET | Freq: Every day | ORAL | 6 refills | Status: DC
Start: 2017-05-29 — End: 2017-12-31

## 2017-05-29 MED ORDER — ASPIRIN EC 81 MG PO TBEC: 81.0000 mg | DELAYED_RELEASE_TABLET | Freq: Every day | ORAL | 3 refills | Status: DC

## 2017-05-29 MED ORDER — ISOSORBIDE MONONITRATE ER 30 MG PO TB24: 30.0000 mg | ORAL_TABLET | Freq: Every day | ORAL | 6 refills | Status: DC

## 2017-05-29 NOTE — Patient Instructions (Addendum)
MEDICATIONS  --START  ISOSORBIDE MONO ( IMDUR) 30 MG ONE TABLET DAILY IT IS PROBABLY BEST TAKE AT BEDTIME.-- TAKE 30 MIN AFTER 81 MG ASPIRIN   --FUROSEMIDE 20 MG ONE TABLET DAILY  ( FLUID PILL)   ---MAY USE  NITROGLYCERIN 0.4 MG  UNDER TONGUE AEVERY 5 MINUTES UP TO 3 TIMES WITH EPISODES OF CHEST PAIN      KEEP  APPOINTMENT FOR CORONARY CTA.   Your physician recommends that you schedule a follow-up appointment in [place].    Nitroglycerin sublingual tablets What is this medicine? NITROGLYCERIN (nye troe GLI ser in) is a type of vasodilator. It relaxes blood vessels, increasing the blood and oxygen supply to your heart. This medicine is used to relieve chest pain caused by angina. It is also used to prevent chest pain before activities like climbing stairs, going outdoors in cold weather, or sexual activity. This medicine may be used for other purposes; ask your health care provider or pharmacist if you have questions. COMMON BRAND NAME(S): Nitroquick, Nitrostat, Nitrotab What should I tell my health care provider before I take this medicine? They need to know if you have any of these conditions: -anemia -head injury, recent stroke, or bleeding in the brain -liver disease -previous heart attack -an unusual or allergic reaction to nitroglycerin, other medicines, foods, dyes, or preservatives -pregnant or trying to get pregnant -breast-feeding How should I use this medicine? Take this medicine by mouth as needed. At the first sign of an angina attack (chest pain or tightness) place one tablet under your tongue. You can also take this medicine 5 to 10 minutes before an event likely to produce chest pain. Follow the directions on the prescription label. Let the tablet dissolve under the tongue. Do not swallow whole. Replace the dose if you accidentally swallow it. It will help if your mouth is not dry. Saliva around the tablet will help it to dissolve more quickly. Do  not eat or drink, smoke or chew tobacco while a tablet is dissolving. If you are not better within 5 minutes after taking ONE dose of nitroglycerin, call 9-1-1 immediately to seek emergency medical care. Do not take more than 3 nitroglycerin tablets over 15 minutes. If you take this medicine often to relieve symptoms of angina, your doctor or health care professional may provide you with different instructions to manage your symptoms. If symptoms do not go away after following these instructions, it is important to call 9-1-1 immediately. Do not take more than 3 nitroglycerin tablets over 15 minutes. Talk to your pediatrician regarding the use of this medicine in children. Special care may be needed. Overdosage: If you think you have taken too much of this medicine contact a poison control center or emergency room at once. NOTE: This medicine is only for you. Do not share this medicine with others. What if I miss a dose? This does not apply. This medicine is only used as needed. What may interact with this medicine? Do not take this medicine with any of the following medications: -certain migraine medicines like ergotamine and dihydroergotamine (DHE) -medicines used to treat erectile dysfunction like sildenafil, tadalafil, and vardenafil -riociguat This medicine may also interact with the following medications: -alteplase -aspirin -heparin -medicines for high blood pressure -medicines for mental depression -other medicines used to treat angina -phenothiazines like chlorpromazine, mesoridazine, prochlorperazine, thioridazine This list may not describe all possible interactions. Give your health care provider a list of all the medicines, herbs, non-prescription drugs,  or dietary supplements you use. Also tell them if you smoke, drink alcohol, or use illegal drugs. Some items may interact with your medicine. What should I watch for while using this medicine? Tell your doctor or health care  professional if you feel your medicine is no longer working. Keep this medicine with you at all times. Sit or lie down when you take your medicine to prevent falling if you feel dizzy or faint after using it. Try to remain calm. This will help you to feel better faster. If you feel dizzy, take several deep breaths and lie down with your feet propped up, or bend forward with your head resting between your knees. You may get drowsy or dizzy. Do not drive, use machinery, or do anything that needs mental alertness until you know how this drug affects you. Do not stand or sit up quickly, especially if you are an older patient. This reduces the risk of dizzy or fainting spells. Alcohol can make you more drowsy and dizzy. Avoid alcoholic drinks. Do not treat yourself for coughs, colds, or pain while you are taking this medicine without asking your doctor or health care professional for advice. Some ingredients may increase your blood pressure. What side effects may I notice from receiving this medicine? Side effects that you should report to your doctor or health care professional as soon as possible: -blurred vision -dry mouth -skin rash -sweating -the feeling of extreme pressure in the head -unusually weak or tired Side effects that usually do not require medical attention (report to your doctor or health care professional if they continue or are bothersome): -flushing of the face or neck -headache -irregular heartbeat, palpitations -nausea, vomiting This list may not describe all possible side effects. Call your doctor for medical advice about side effects. You may report side effects to FDA at 1-800-FDA-1088. Where should I keep my medicine? Keep out of the reach of children. Store at room temperature between 20 and 25 degrees C (68 and 77 degrees F). Store in [employer]. Protect from light and moisture. Keep tightly closed. Throw away any unused medicine after the expiration date. NOTE:  This sheet is a summary. It may not cover all possible information. If you have questions about this medicine, talk to your doctor, pharmacist, or health care provider.  2018 [employer] Standard (2013-04-10 17:57:36)  Isosorbide Mononitrate extended-release tablets What is this medicine? ISOSORBIDE MONONITRATE (eye soe SOR bide mon oh NYE trate) is a vasodilator. It relaxes blood vessels, increasing the blood and oxygen supply to your heart. This medicine is used to prevent chest pain caused by angina. It will not help to stop an episode of chest pain. This medicine may be used for other purposes; ask your health care provider or pharmacist if you have questions. COMMON BRAND NAME(S): Imdur, Isotrate ER What should I tell my health care provider before I take this medicine? They need to know if you have any of these conditions: -previous heart attack or heart failure -an unusual or allergic reaction to isosorbide mononitrate, nitrates, other medicines, foods, dyes, or preservatives -pregnant or trying to get pregnant -breast-feeding How should I use this medicine? Take this medicine by mouth with a glass of water. Follow the directions on the prescription label. Do not crush or chew. Take your medicine at regular intervals. Do not take your medicine more often than directed. Do not stop taking this medicine except on the advice of your doctor or health care professional. Talk to your pediatrician  regarding the use of this medicine in children. Special care may be needed. Overdosage: If you think you have taken too much of this medicine contact a poison control center or emergency room at once. NOTE: This medicine is only for you. Do not share this medicine with others. What if I miss a dose? If you miss a dose, take it as soon as you can. If it is almost time for your next dose, take only that dose. Do not take double or extra doses. What may interact with this medicine? Do not take this  medicine with any of the following medications: -medicines used to treat erectile dysfunction (ED) like avanafil, sildenafil, tadalafil, and vardenafil -riociguat This medicine may also interact with the following medications: -medicines for high blood pressure -other medicines for angina or heart failure This list may not describe all possible interactions. Give your health care provider a list of all the medicines, herbs, non-prescription drugs, or dietary supplements you use. Also tell them if you smoke, drink alcohol, or use illegal drugs. Some items may interact with your medicine. What should I watch for while using this medicine? Check your heart rate and blood pressure regularly while you are taking this medicine. Ask your doctor or health care professional what your heart rate and blood pressure should be and when you should contact him or her. Tell your doctor or health care professional if you feel your medicine is no longer working. You may get dizzy. Do not drive, use machinery, or do anything that needs mental alertness until you know how this medicine affects you. To reduce the risk of dizzy or fainting spells, do not sit or stand up quickly, especially if you are an older patient. Alcohol can make you more dizzy, and increase flushing and rapid heartbeats. Avoid alcoholic drinks. Do not treat yourself for coughs, colds, or pain while you are taking this medicine without asking your doctor or health care professional for advice. Some ingredients may increase your blood pressure. What side effects may I notice from receiving this medicine? Side effects that you should report to your doctor or health care professional as soon as possible: -bluish discoloration of lips, fingernails, or palms of hands -irregular heartbeat, palpitations -low blood pressure -nausea, vomiting -persistent headache -unusually weak or tired Side effects that usually do not require medical attention (report to  your doctor or health care professional if they continue or are bothersome): -flushing of the face or neck -rash This list may not describe all possible side effects. Call your doctor for medical advice about side effects. You may report side effects to FDA at 1-800-FDA-1088. Where should I keep my medicine? Keep out of the reach of children. Store between 15 and 30 degrees C (59 and 86 degrees F). Keep container tightly closed. Throw away any unused medicine after the expiration date. NOTE: This sheet is a summary. It may not cover all possible information. If you have questions about this medicine, talk to your doctor, pharmacist, or health care provider.  2018 [employer] Standard (2013-04-11 14:48:19)

## 2017-05-29 NOTE — Telephone Encounter (Signed)
Patient was seen today by Dr. Ellyn Hack and requested her Cardiac CTA be scheduled before12/28/18.  I spoke with the schedulers at our [facility] location and as of now, there are no earlier appointment available.  I spoke with the patient and explained this to her.  She voiced her understanding.

## 2017-05-29 NOTE — Progress Notes (Signed)
PCP: Plotnikov, [name], MD  Clinic Note: Chief Complaint  Patient presents with  . Follow-up    Echocardiogram.  Shows severe aortic regurgitation.  . Edema    Feet  . Shortness of Breath  . Headache  . Chest Pain    HPI: Kaylee Hansen is a 61 y.o. female -long-term smoker who has significant COPD,.  She is being seen today for the follow-up of evaluation of tightness in the chest at the request of Plotnikov, [name], MD.  She was initially seen on November 9, she complained of chest discomfort and dyspnea.  It was difficult to tell what her symptoms all were.  She deftly had an aortic regurgitation murmur so I ordered an echocardiogram. She returns today complaining of of progressive chest pain.   Recent Hospitalizations:   None since her COPD exacerbation on October 26  [place] was last seen on November 7 by Dr. Melvyn Novas (PCCM) -treated for acute COPD exacerbation with antibiotics.  Studies Personally Reviewed - (if available, images/films reviewed: From Epic Chart or Care Everywhere)  2D echo 05/15/2017: EF 60-65%.  GR 1 DD.  Mild AS with severe AI now severe)   Interval History: Kaylee Hansen is still essentially wheelchair bound because of hip and knee issues.  She is profoundly dyspneic with just about any activity but has been noticing several episodes of chest discomfort off and on throughout the course of the day.  And oftentimes exacerbated with coughing.  She often notes chest pain that happens shortly after eating a meal.  If she does any activity after a meal she is likely to have chest discomfort.  Not usually when she has not eaten.  She does not lie flat and therefore is difficult to tell if she has orthopnea or just simply bad back.  She does become short of breath, but not usually with classic PND type symptoms.  She has baseline edema but not significant.  She denies any rapid irregular heartbeats or palpitations.  No syncope/near syncope or TIA/amaurosis  fugax. She does not walk therefore does not have claudication.  ROS: A comprehensive was performed. Review of Systems  Constitutional: Positive for malaise/fatigue (Chronic.  Essentially wheelchair-bound.  No energy). Negative for chills, fever and weight loss.  HENT: Positive for congestion and sore throat (Hoarse voice). Negative for hearing loss.   Respiratory: Positive for cough (Chronic, but now more frequent), sputum production (Not purulent), shortness of breath and wheezing.   Cardiovascular: Positive for chest pain.  Gastrointestinal: Positive for abdominal pain, constipation, diarrhea and heartburn. Negative for blood in stool and melena.       She has some type of chronic colitis with intermittent abdominal pain and constipation.  Now she is having diarrhea.  Genitourinary: Negative for dysuria, frequency and hematuria.  Musculoskeletal: Positive for back pain, falls (She does not stand up without assistance), joint pain and myalgias.       Chronic profound hip and knee pain presumably from arthritis.  Does not have strength to stand on her own.  Neurological: Positive for dizziness and headaches (With congestion). Negative for focal weakness, loss of consciousness and weakness (Lower extremities of the week as part.).  Psychiatric/Behavioral: Positive for depression. Negative for memory loss. The patient is nervous/anxious and has insomnia.   All other systems reviewed and are negative.  I have reviewed and (if needed) personally updated the patient's problem list, medications, allergies, past medical and surgical history, social and family history.   Past  Medical History:  Diagnosis Date  . Anxiety   . Collagenous colitis   . Colon adenomas 2011  . Constipation    Chronic abdominal pain and constipation  . COPD (chronic obstructive pulmonary disease) ([place])   . Depression   . Diverticulosis   . Esophageal stricture 11/08/2012  . Esophageal ulcer    04/2009 EGD  .  Esophageal ulcer 04/2009  . GERD (gastroesophageal reflux disease)   . Helicobacter pylori gastritis 2010   Pylera Tx  . History of cholecystectomy   . Hx of appendectomy   . Hx of cancer of lung 1999  . Hx of hysterectomy   . Hyperlipidemia   . Hypothyroidism   . Low back pain   . Osteoarthritis of knee    bilateral knee  . Seizures ([place])   . Stroke [facility])    CVA, hx of 97  . 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 ABDOMINAL HYSTERECTOMY     complete 1992  . ABDOMINAL SURGERY     Exploratory  . APPENDECTOMY    . BALLOON DILATION N/A 11/08/2012   Procedure: BALLOON DILATION;  Surgeon: Gatha Mayer, MD;  Location: [facility] ENDOSCOPY;  Service: Endoscopy;  Laterality: N/A;  . CHOLECYSTECTOMY    . COLONOSCOPY W/ BIOPSIES     multiple  . ESOPHAGOGASTRODUODENOSCOPY     w/baloon x 2  . ESOPHAGOGASTRODUODENOSCOPY N/A 11/08/2012   Procedure: ESOPHAGOGASTRODUODENOSCOPY (EGD);  Surgeon: Gatha Mayer, MD;  Location: [facility];  Service: Endoscopy;  Laterality: N/A;  . TOTAL HIP ARTHROPLASTY     Left    Current Meds  Medication Sig  . albuterol (PROVENTIL HFA;VENTOLIN HFA) 108 (90 Base) MCG/ACT inhaler Inhale 2 puffs into the lungs every 6 (six) hours as needed for wheezing.  [name] albuterol (PROVENTIL) (2.5 MG/3ML) 0.083% nebulizer solution Take 3 mLs (2.5 mg total) by nebulization 4 (four) times daily. (Patient taking differently: Take 2.5 mg by nebulization every 6 (six) hours as needed for wheezing or shortness of breath. )  . benzonatate (TESSALON) 100 MG capsule Take 1 capsule (100 mg total) by mouth 3 (three) times daily as needed for cough.  . budesonide-formoterol (SYMBICORT) 160-4.5 MCG/ACT inhaler Inhale 2 puffs 2 (two) times daily at 10 AM and 5 PM into the lungs. Rinse mouth after each use  . Camphor-Menthol-Methyl Sal (HM SALONPAS PAIN RELIEF EX) Apply 1 application topically at bedtime as needed (pain relief).  . cycloSPORINE  (RESTASIS) 0.05 % ophthalmic emulsion Place 1 drop into both eyes 2 (two) times daily.  . famotidine (PEPCID) 20 MG tablet One at bedtime  . guaiFENesin (MUCINEX) 600 MG 12 hr tablet Take 1 tablet (600 mg total) by mouth 2 (two) times daily as needed for cough or to loosen phlegm.  [name] levothyroxine (SYNTHROID, LEVOTHROID) 150 MCG tablet Take 1 tablet (150 mcg total) by mouth daily.  [name] linaclotide (LINZESS) 290 MCG CAPS capsule TAKE ONE CAPSULE BY MOUTH BEFORE BREAKFAST  . NON FORMULARY 2.5 lpm with sleep only  . phenytoin (DILANTIN) 100 MG ER capsule 100 mg every morning and 200 mg every night.  . predniSONE (DELTASONE) 10 MG tablet Take  4 each am x 2 days,   2 each am x 2 days,  1 each am x 2 days and stop  . promethazine (PHENERGAN) 25 MG tablet Take 1 tablet (25 mg total) by mouth daily. (Patient taking differently: Take 25 mg by mouth every 6 (six) hours as  needed for nausea or vomiting. )  . Respiratory Therapy Supplies (FLUTTER) DEVI As directed  . SSD 1 % cream Apply topically daily. Apply to burns  . SUMAtriptan (IMITREX) 100 MG tablet TAKE ONE TABLET BY MOUTH EVERY 2 HOURS AS NEEDED FOR  MIGRAINE  OR  HEADACHE.  MAY  REPEAT  IN  2  HOURS  IF  HEADACHE  PERSISTS  OR  RECUR  . Tiotropium Bromide Monohydrate (SPIRIVA RESPIMAT) 2.5 MCG/ACT AERS Inhale 2 puffs daily into the lungs.  . [DISCONTINUED] ALPRAZolam (XANAX) 0.25 MG tablet Take 1 tablet (0.25 mg total) by mouth at bedtime as needed for anxiety.  . [DISCONTINUED] cefdinir (OMNICEF) 300 MG capsule Take 1 capsule (300 mg total) 2 (two) times daily by mouth.  . [DISCONTINUED] HYDROcodone-acetaminophen (NORCO) 10-325 MG tablet Take 1 tablet by mouth every 6 (six) hours as needed for moderate pain or severe pain. Please fill on or after 05/20/17  . [DISCONTINUED] pantoprazole (PROTONIX) 40 MG tablet Take 1 tablet (40 mg total) daily by mouth. Take 30-60 min before first meal of the day  . [DISCONTINUED] PARoxetine (PAXIL) 40 MG tablet TAKE  0.5 tablet BY MOUTH DAILY.  . [DISCONTINUED] traZODone (DESYREL) 100 MG tablet Take 1 tablet (100 mg total) by mouth at bedtime.  . [DISCONTINUED] Vitamin D, Ergocalciferol, (DRISDOL) 50000 units CAPS capsule Take 1 capsule (50,000 Units total) by mouth once a week.    Allergies  Allergen Reactions  . Morphine     "Makes me go crazy."   . Oxycodone Hcl Other (See Comments)    Knocked her out for 6 days  . Penicillins Hives    Has patient had a PCN reaction causing immediate rash, facial/tongue/throat swelling, SOB or lightheadedness with hypotension: unknown Has patient had a PCN reaction causing severe rash involving mucus membranes or skin necrosis: unknown Has patient had a PCN reaction that required hospitalization No Has patient had a PCN reaction occurring within the last 10 years: No If all of the above answers are "NO", then may proceed with Cephalosporin use.   . Tape Other (See Comments)    Skin turns red and burns    Social History   Socioeconomic History  . Marital status: Married    Spouse name: None  . Number of children: 2  . Years of education: None  . Highest education level: None  Social Needs  . Financial resource strain: None  . Food insecurity - worry: None  . Food insecurity - inability: None  . Transportation needs - medical: None  . Transportation needs - non-medical: None  Occupational History  . Occupation: disabled    Employer: DISABLED  Tobacco Use  . Smoking status: Current Every Day Smoker    Packs/day: 1.00    Years: 48.00    Pack years: 48.00    Types: Cigarettes  . Smokeless tobacco: Never Used  Substance and Sexual Activity  . Alcohol use: No  . Drug use: No  . Sexual activity: Not Currently  Other Topics Concern  . None  Social History Narrative   Married   2 children   No regular exercise          family history includes Coronary artery disease in her mother and other; Diabetes in her mother and son; Hypertension in her  father; Kidney cancer in her sister; Kidney disease in her other.  Wt Readings from Last 3 Encounters:  05/30/17 171 lb (77.6 kg)  05/29/17 174 lb (78.9 kg)  05/04/17  172 lb (78 kg)    PHYSICAL EXAM BP 138/60   Pulse 80   Ht 5\' 5"  (1.651 m)   Wt 174 lb (78.9 kg)   BMI 28.96 kg/m  Physical Exam  Constitutional: She is oriented to person, place, and time. No distress (Chronically ill-appearing).  looks older than her stated age.  She is in a wheelchair, profoundly weak with increased work of breathing.  He is coughing frequently.  She is well-groomed and well-nourished.  No acute distress  HENT:  Head: Normocephalic and atraumatic.  Mouth/Throat: No oropharyngeal exudate.  Neck: Neck supple. No JVD present. Carotid bruit is present (Left> right).  Cardiovascular: Normal rate and regular rhythm.  Occasional extrasystoles are present. PMI is not displaced. Exam reveals distant heart sounds and decreased pulses (Decreased bilateral pedal pulses). Exam reveals no gallop and no friction rub.  Murmur heard. High-pitched harsh crescendo-decrescendo midsystolic murmur is present with a grade of 2/6 at the upper right sternal border radiating to the neck.  Low-pitched blowing plateau holodiastolic murmur is present with a grade of 3/6 at the lower left sternal border. Pulmonary/Chest: She has no rales.  Accessory muscle use at rest, but nonlabored.  She seems to be in less respiratory distress to my last saw her.  Less frequent coughing.  Still has wheezing.  She has diffuse rhonchorous breath sounds with diffuse expiratory wheeze.  Abdominal: Soft. Bowel sounds are normal. She exhibits no distension. There is tenderness (Diffuse mild tenderness). There is no rebound and no guarding.  Musculoskeletal: She exhibits edema (Trivial).  Wheelchair-bound  Neurological: She is alert and oriented to person, place, and time.  Skin: Skin is warm and dry. She is not diaphoretic.  Psychiatric:  Difficult  to assess because of her frequent coughing and breathing issues.  Seems to be somewhat depressed in mood but otherwise has normal judgment and behavior.  Nursing note and vitals reviewed.   Adult ECG Report Not checked  Other studies Reviewed: Additional studies/ records that were reviewed today include:  Recent Labs:   Lab Results  Component Value Date   CREATININE 1.03 (H) 04/20/2017   BUN 11 04/20/2017   NA 140 04/20/2017   K 4.6 04/20/2017   CL 105 04/20/2017   CO2 29 04/20/2017   Lab Results  Component Value Date   CHOL 310 (H) 07/14/2015   HDL 38.00 (L) 07/14/2015   LDLCALC (H) - 240 05/12/2009   LDLDIRECT 187.0 07/14/2015   TRIG 300.0 (H) 07/14/2015   CHOLHDL 8 07/14/2015     ASSESSMENT / PLAN: Problem List Items Addressed This Visit    Chest pain with high risk for cardiac etiology - Primary (Chronic)    She has had a negative Myoview in the past.  I ordered plan to order a coronary CT angiogram to evaluate her coronary anatomy which will allow [place] to possibly do FFR if necessary. Now with evidence of severe regurgitation, I discussed with her the possibility of actually doing invasive coronary angiography as opposed to CT coronary angiography.  She would prefer to do noninvasive.  Therefore we will continue with plan for a coronary CTA with FFR.      Cigarette smoker    Still smoking but not that much.  This is being discussed in detail and her pulmonary visits.  Pending results of her CT coronary angiogram, we may have more ammunition to more forcefully recommend full cessation.      Essential hypertension, benign (Chronic)    Blood pressure  relatively well controlled on no medications.  Simply monitor.      Relevant Medications   isosorbide mononitrate (IMDUR) 30 MG 24 hr tablet   nitroGLYCERIN (NITROSTAT) 0.4 MG SL tablet   furosemide (LASIX) 20 MG tablet   aspirin EC 81 MG tablet   Hyperlipidemia with target LDL less than 100 (Chronic)    She is "statin  intolerant ".  I do not have recent labs on her most recent labs from 2017 were well out of control. - .  Need to evaluate her risk stratification from a cardiac/coronary artery standpoint.  We will check  -We will check repeat lipid panel. After coronary CT angiogram will determine the extent of her coronary artery disease.  May need to consider PC SK 9 inhibitor.      Relevant Medications   isosorbide mononitrate (IMDUR) 30 MG 24 hr tablet   nitroGLYCERIN (NITROSTAT) 0.4 MG SL tablet   furosemide (LASIX) 20 MG tablet   aspirin EC 81 MG tablet   Severe aortic regurgitation by prior echocardiography (Chronic)    It is hard to tell if her dyspnea is probably related to aortic regurgitation.  With her now having chest pain, I do think coronary artery evaluation is necessary. We had talked about various options.  I think that the Myoview may very well be inadequate to determine if there is any multivessel disease and therefore I think either cardiac catheterization versus "CT coronary angiogram is probably the best option.  I am not overly convinced that her symptoms are anginal in nature, but with severe aortic regurgitation, knowledge of her coronary anatomy is necessary. Plan: Coronary CT angiogram. -We will need to discuss potential options with cardiac surgery.  I do not think she would probably be a good candidate for open valve replacement, but would potentially pose the possibility of TAVR.      Relevant Medications   isosorbide mononitrate (IMDUR) 30 MG 24 hr tablet   nitroGLYCERIN (NITROSTAT) 0.4 MG SL tablet   furosemide (LASIX) 20 MG tablet   aspirin EC 81 MG tablet      Current medicines are reviewed at length with the patient today. (+/- concerns) n/a The following changes have been made: n/a  Patient Instructions  [facility]-- DO NOT EAT OR DRINKT THE MORNING OF THE TEST LIPID BMP HEPATIC   SCHEDULE AT [place] [address] Your physician has  requested that you have an echocardiogram. Echocardiography is a painless test that uses sound waves to create images of your heart. It provides your doctor with information about the size and shape of your heart and how well your heart's chambers and valves are working. This procedure takes approximately one hour. There are no restrictions for this procedure.    SCHEDULE AT [facility] Your physician has requested that you have cardiac CT. Cardiac computed tomography (CT) is a painless test that uses an x-ray machine to take clear, detailed pictures of your heart. For further information please visit [email or web address]. Please follow instruction sheet as given.PLEASE HAVE LAB WORK DONE AT LEAST ONE WEEK PRIOR TO TEST , AN PICK UP METOPROLOL FROM THE PHARMACY   Your physician recommends that you schedule a follow-up appointment in 2 MONTHS WITH DR HARDING.  Studies Ordered:   No orders of the defined types were placed in this encounter.     [name], M.D., M.S. Interventional Cardiologist   Pager # [phone] Phone # [phone] [address] [place], [place] [address]

## 2017-05-30 ENCOUNTER — Encounter: Payer: Self-pay | Admitting: Cardiology

## 2017-05-30 ENCOUNTER — Ambulatory Visit (INDEPENDENT_AMBULATORY_CARE_PROVIDER_SITE_OTHER): Payer: Medicare HMO | Admitting: Internal Medicine

## 2017-05-30 ENCOUNTER — Encounter: Payer: Self-pay | Admitting: Internal Medicine

## 2017-05-30 DIAGNOSIS — J449 Chronic obstructive pulmonary disease, unspecified: Secondary | ICD-10-CM

## 2017-05-30 MED ORDER — TRAZODONE HCL 100 MG PO TABS: 100.0000 mg | ORAL_TABLET | Freq: Every day | ORAL | 3 refills | Status: DC

## 2017-05-30 MED ORDER — HYDROCODONE-ACETAMINOPHEN 10-325 MG PO TABS: 1.0000 | ORAL_TABLET | Freq: Four times a day (QID) | ORAL | 0 refills | Status: DC | PRN

## 2017-05-30 MED ORDER — PANTOPRAZOLE SODIUM 40 MG PO TBEC: 40.0000 mg | DELAYED_RELEASE_TABLET | Freq: Every day | ORAL | 3 refills | Status: DC

## 2017-05-30 MED ORDER — PAROXETINE HCL 40 MG PO TABS: ORAL_TABLET | ORAL | 3 refills | Status: DC

## 2017-05-30 MED ORDER — ALPRAZOLAM 0.25 MG PO TABS: 0.2500 mg | ORAL_TABLET | Freq: Every evening | ORAL | 1 refills | Status: DC | PRN

## 2017-05-30 MED ORDER — VITAMIN D (ERGOCALCIFEROL) 1.25 MG (50000 UNIT) PO CAPS: 50000.0000 [IU] | ORAL_CAPSULE | ORAL | 3 refills | Status: DC

## 2017-05-30 NOTE — Assessment & Plan Note (Addendum)
She is "statin intolerant ".  I do not have recent labs on her most recent labs from 2017 were well out of control. - .  Need to evaluate her risk stratification from a cardiac/coronary artery standpoint.  We will check  -We will check repeat lipid panel. After coronary CT angiogram will determine the extent of her coronary artery disease.  May need to consider PC SK 9 inhibitor.

## 2017-05-30 NOTE — Progress Notes (Addendum)
Subjective:  Patient ID: Kaylee Hansen, female    DOB: [birth year]  Age: 61 y.o. MRN: [id number]  CC: No chief complaint on file.   HPI Jennifer L Boza presents for chest pressure sx's. Pt saw Dr Melvyn Novas and Dr Ellyn Hack. Recent med notes reviewed.  F/u COPD, lung ca, chronic pain. C/o meds and tests to be too $$$  Outpatient Medications Prior to Visit  Medication Sig Dispense Refill  . albuterol (PROVENTIL HFA;VENTOLIN HFA) 108 (90 Base) MCG/ACT inhaler Inhale 2 puffs into the lungs every 6 (six) hours as needed for wheezing. 1 Inhaler 5  . albuterol (PROVENTIL) (2.5 MG/3ML) 0.083% nebulizer solution Take 3 mLs (2.5 mg total) by nebulization 4 (four) times daily. (Patient taking differently: Take 2.5 mg by nebulization every 6 (six) hours as needed for wheezing or shortness of breath. ) 120 vial 11  . ALPRAZolam (XANAX) 0.25 MG tablet Take 1 tablet (0.25 mg total) by mouth at bedtime as needed for anxiety. 30 tablet 2  . aspirin EC 81 MG tablet Take 1 tablet (81 mg total) by mouth daily. 90 tablet 3  . benzonatate (TESSALON) 100 MG capsule Take 1 capsule (100 mg total) by mouth 3 (three) times daily as needed for cough. 20 capsule 0  . budesonide-formoterol (SYMBICORT) 160-4.5 MCG/ACT inhaler Inhale 2 puffs 2 (two) times daily at 10 AM and 5 PM into the lungs. Rinse mouth after each use 1 Inhaler 0  . Camphor-Menthol-Methyl Sal (HM SALONPAS PAIN RELIEF EX) Apply 1 application topically at bedtime as needed (pain relief).    . cefdinir (OMNICEF) 300 MG capsule Take 1 capsule (300 mg total) 2 (two) times daily by mouth. 20 capsule 0  . cycloSPORINE (RESTASIS) 0.05 % ophthalmic emulsion Place 1 drop into both eyes 2 (two) times daily. 0.4 mL 2  . famotidine (PEPCID) 20 MG tablet One at bedtime 30 tablet 2  . furosemide (LASIX) 20 MG tablet Take 1 tablet (20 mg total) by mouth daily. 30 tablet 6  . guaiFENesin (MUCINEX) 600 MG 12 hr tablet Take 1 tablet (600 mg total) by mouth 2 (two) times daily as  needed for cough or to loosen phlegm. 14 tablet 0  . HYDROcodone-acetaminophen (NORCO) 10-325 MG tablet Take 1 tablet by mouth every 6 (six) hours as needed for moderate pain or severe pain. Please fill on or after 05/20/17 120 tablet 0  . isosorbide mononitrate (IMDUR) 30 MG 24 hr tablet Take 1 tablet (30 mg total) by mouth daily. 30 tablet 6  . levothyroxine (SYNTHROID, LEVOTHROID) 150 MCG tablet Take 1 tablet (150 mcg total) by mouth daily. 90 tablet 3  . linaclotide (LINZESS) 290 MCG CAPS capsule TAKE ONE CAPSULE BY MOUTH BEFORE BREAKFAST 30 capsule 2  . nitroGLYCERIN (NITROSTAT) 0.4 MG SL tablet Place 1 tablet (0.4 mg total) under the tongue every 5 (five) minutes as needed for chest pain. 25 tablet 5  . NON FORMULARY 2.5 lpm with sleep only    . pantoprazole (PROTONIX) 40 MG tablet Take 1 tablet (40 mg total) daily by mouth. Take 30-60 min before first meal of the day 30 tablet 2  . PARoxetine (PAXIL) 40 MG tablet TAKE 0.5 tablet BY MOUTH DAILY. 90 tablet 3  . phenytoin (DILANTIN) 100 MG ER capsule 100 mg every morning and 200 mg every night. 270 capsule 3  . predniSONE (DELTASONE) 10 MG tablet Take  4 each am x 2 days,   2 each am x 2 days,  1 each  am x 2 days and stop 14 tablet 0  . promethazine (PHENERGAN) 25 MG tablet Take 1 tablet (25 mg total) by mouth daily. (Patient taking differently: Take 25 mg by mouth every 6 (six) hours as needed for nausea or vomiting. ) 90 tablet 1  . Respiratory Therapy Supplies (FLUTTER) DEVI As directed 1 each 0  . SSD 1 % cream Apply topically daily. Apply to burns 50 g 1  . SUMAtriptan (IMITREX) 100 MG tablet TAKE ONE TABLET BY MOUTH EVERY 2 HOURS AS NEEDED FOR  MIGRAINE  OR  HEADACHE.  MAY  REPEAT  IN  2  HOURS  IF  HEADACHE  PERSISTS  OR  RECUR 9 tablet 2  . Tiotropium Bromide Monohydrate (SPIRIVA RESPIMAT) 2.5 MCG/ACT AERS Inhale 2 puffs daily into the lungs. 1 Inhaler 11  . traZODone (DESYREL) 100 MG tablet Take 1 tablet (100 mg total) by mouth at  bedtime. 90 tablet 3  . Vitamin D, Ergocalciferol, (DRISDOL) 50000 units CAPS capsule Take 1 capsule (50,000 Units total) by mouth once a week. 12 capsule 3  . metoprolol tartrate (LOPRESSOR) 50 MG tablet Take 1 tablet (50 mg total) once for 1 dose by mouth. TAKE ONE HOUR PRIOR TO  SCHEDULE CARDAIC TEST 1 tablet 0   No facility-administered medications prior to visit.     ROS Review of Systems  Constitutional: Positive for fatigue. Negative for activity change, appetite change, chills and unexpected weight change.  HENT: Negative for congestion, mouth sores and sinus pressure.   Eyes: Negative for visual disturbance.  Respiratory: Positive for shortness of breath and wheezing. Negative for cough and chest tightness.   Gastrointestinal: Positive for abdominal distention. Negative for abdominal pain and nausea.  Genitourinary: Negative for difficulty urinating, frequency and vaginal pain.  Musculoskeletal: Positive for arthralgias, back pain and gait problem.  Skin: Negative for pallor and rash.  Neurological: Negative for dizziness, tremors, weakness, numbness and headaches.  Psychiatric/Behavioral: Positive for decreased concentration and sleep disturbance. Negative for confusion and suicidal ideas. The patient is nervous/anxious.     Objective:  BP 126/68 (BP Location: Left Arm, Patient Position: Sitting, Cuff Size: Normal)   Pulse 87   Temp 97.9 F (36.6 C) (Oral)   Ht 5\' 5"  (1.651 m)   Wt 171 lb (77.6 kg)   SpO2 98%   BMI 28.46 kg/m   BP Readings from Last 3 Encounters:  05/30/17 126/68  05/29/17 138/60  05/04/17 136/72    Wt Readings from Last 3 Encounters:  05/30/17 171 lb (77.6 kg)  05/29/17 174 lb (78.9 kg)  05/04/17 172 lb (78 kg)    Physical Exam  Constitutional: She appears well-developed. No distress.  HENT:  Head: Normocephalic.  Right Ear: External ear normal.  Left Ear: External ear normal.  Nose: Nose normal.  Mouth/Throat: Oropharynx is clear and  moist.  Eyes: Conjunctivae are normal. Pupils are equal, round, and reactive to light. Right eye exhibits no discharge. Left eye exhibits no discharge.  Neck: Normal range of motion. Neck supple. No JVD present. No tracheal deviation present. No thyromegaly present.  Cardiovascular: Normal rate, regular rhythm and normal heart sounds.  Pulmonary/Chest: No stridor. No respiratory distress. She has no wheezes.  Abdominal: Soft. Bowel sounds are normal. She exhibits no distension and no mass. There is no tenderness. There is no rebound and no guarding.  Musculoskeletal: She exhibits tenderness. She exhibits no edema.  Lymphadenopathy:    She has no cervical adenopathy.  Neurological: She displays normal  reflexes. No cranial nerve deficit. She exhibits normal muscle tone. Coordination abnormal.  Skin: No rash noted. No erythema.  Psychiatric: She has a normal mood and affect. Her behavior is normal. Judgment and thought content normal.  in a w/c  Lab Results  Component Value Date   WBC 7.4 04/20/2017   HGB 11.3 (L) 04/20/2017   HCT 35.3 (L) 04/20/2017   PLT 274 04/20/2017   GLUCOSE 98 04/20/2017   CHOL 310 (H) 07/14/2015   TRIG 300.0 (H) 07/14/2015   HDL 38.00 (L) 07/14/2015   LDLDIRECT 187.0 07/14/2015   LDLCALC (H) 05/12/2009    240        Total Cholesterol/HDL:CHD Risk Coronary Heart Disease Risk Table                     Men   Women  1/2 Average Risk   3.4   3.3  Average Risk       5.0   4.4  2 X Average Risk   9.6   7.1  3 X Average Risk  23.4   11.0        Use the calculated Patient Ratio above and the CHD Risk Table to determine the patient's CHD Risk.        ATP III CLASSIFICATION (LDL):  <100     mg/dL   Optimal  100-129  mg/dL   Near or Above                    Optimal  130-159  mg/dL   Borderline  160-189  mg/dL   High  >190     mg/dL   Very High   ALT 11 10/03/2016   AST 13 10/03/2016   NA 140 04/20/2017   K 4.6 04/20/2017   CL 105 04/20/2017   CREATININE  1.03 (H) 04/20/2017   BUN 11 04/20/2017   CO2 29 04/20/2017   TSH 0.46 07/14/2015   INR 1.0 09/16/2008   HGBA1C  09/17/2008    5.0 (NOTE)   The ADA recommends the following therapeutic goal for glycemic   control related to Hgb A1C measurement:   Goal of Therapy:   < 7.0% Hgb A1C   Reference: American Diabetes Association: Clinical Practice   Recommendations 2008, Diabetes Care,  2008, 31:(Suppl 1).    Dg Chest 2 View  Result Date: 04/20/2017 CLINICAL DATA:  Initial evaluation for acute sternal chest pain we shortness of breath. EXAM: CHEST  2 VIEW COMPARISON:  Prior radiograph from 10/03/2016. FINDINGS: Cardiac and mediastinal silhouettes are stable in size and contour, and remain within normal limits. Lungs are hypoinflated. No focal infiltrates. No pulmonary edema or pleural effusion. No pneumothorax. Multiple remotely healed bilateral rib fractures noted. No acute osseous abnormality. IMPRESSION: 1. Shallow lung inflation with no radiographic evidence for active cardiopulmonary disease. 2. Multiple remotely healed bilateral rib fractures. Electronically Signed   By: Jeannine Boga M.D.   On: 04/20/2017 15:44    Assessment & Plan:   There are no diagnoses linked to this encounter. I am having Nadeen L. Molinari maintain her NON FORMULARY, promethazine, albuterol, levothyroxine, traZODone, cycloSPORINE, SSD, Vitamin D (Ergocalciferol), SUMAtriptan, PARoxetine, phenytoin, ALPRAZolam, linaclotide, HYDROcodone-acetaminophen, albuterol, Camphor-Menthol-Methyl Sal (HM SALONPAS PAIN RELIEF EX), guaiFENesin, benzonatate, famotidine, pantoprazole, predniSONE, cefdinir, FLUTTER, budesonide-formoterol, Tiotropium Bromide Monohydrate, metoprolol tartrate, isosorbide mononitrate, nitroGLYCERIN, furosemide, and aspirin EC.  No orders of the defined types were placed in this encounter.    Follow-up: No Follow-up on file.  Walker Kehr, MD

## 2017-05-30 NOTE — Assessment & Plan Note (Signed)
Still smoking but not that much.  This is being discussed in detail and her pulmonary visits.  Pending results of her CT coronary angiogram, we may have more ammunition to more forcefully recommend full cessation.

## 2017-05-30 NOTE — Assessment & Plan Note (Signed)
It is hard to tell if her dyspnea is probably related to aortic regurgitation.  With her now having chest pain, I do think coronary artery evaluation is necessary. We had talked about various options.  I think that the Myoview may very well be inadequate to determine if there is any multivessel disease and therefore I think either cardiac catheterization versus "CT coronary angiogram is probably the best option.  I am not overly convinced that her symptoms are anginal in nature, but with severe aortic regurgitation, knowledge of her coronary anatomy is necessary. Plan: Coronary CT angiogram. -We will need to discuss potential options with cardiac surgery.  I do not think she would probably be a good candidate for open valve replacement, but would potentially pose the possibility of TAVR.

## 2017-05-30 NOTE — Assessment & Plan Note (Signed)
Blood pressure relatively well controlled on no medications.  Simply monitor.

## 2017-05-30 NOTE — Assessment & Plan Note (Signed)
She has had a negative Myoview in the past.  I ordered plan to order a coronary CT angiogram to evaluate her coronary anatomy which will allow [place] to possibly do FFR if necessary. Now with evidence of severe regurgitation, I discussed with her the possibility of actually doing invasive coronary angiography as opposed to CT coronary angiography.  She would prefer to do noninvasive.  Therefore we will continue with plan for a coronary CTA with FFR.

## 2017-06-14 ENCOUNTER — Ambulatory Visit: Payer: Medicare HMO | Admitting: Internal Medicine

## 2017-06-14 ENCOUNTER — Ambulatory Visit (INDEPENDENT_AMBULATORY_CARE_PROVIDER_SITE_OTHER): Payer: Medicare HMO | Admitting: Internal Medicine

## 2017-06-14 ENCOUNTER — Encounter: Payer: Self-pay | Admitting: Internal Medicine

## 2017-06-14 VITALS — BP 112/58 | HR 80 | Ht 65.0 in | Wt 171.0 lb

## 2017-06-14 DIAGNOSIS — J449 Chronic obstructive pulmonary disease, unspecified: Secondary | ICD-10-CM

## 2017-06-14 DIAGNOSIS — R69 Illness, unspecified: Secondary | ICD-10-CM | POA: Diagnosis not present

## 2017-06-14 DIAGNOSIS — J441 Chronic obstructive pulmonary disease with (acute) exacerbation: Secondary | ICD-10-CM

## 2017-06-14 DIAGNOSIS — F17210 Nicotine dependence, cigarettes, uncomplicated: Secondary | ICD-10-CM | POA: Diagnosis not present

## 2017-06-14 LAB — PULMONARY FUNCTION TEST
DL/VA % pred: 91 %
DL/VA: 4.5 ml/min/mmHg/L
DLCO cor % pred: 43 %
DLCO cor: 11.05 ml/min/mmHg
DLCO unc % pred: 44 %
DLCO unc: 11.41 ml/min/mmHg
FEF 25-75 Post: 1.9 L/sec
FEF 25-75 Pre: 1.05 L/sec
FEF2575-%Change-Post: 80 %
FEF2575-%Pred-Post: 84 %
FEF2575-%Pred-Pre: 46 %
FEV1-%Change-Post: 18 %
FEV1-%Pred-Post: 49 %
FEV1-%Pred-Pre: 41 %
FEV1-Post: 1.26 L
FEV1-Pre: 1.06 L
FEV1FVC-%Change-Post: 1 %
FEV1FVC-%Pred-Pre: 106 %
FEV6-%Change-Post: 13 %
FEV6-%Pred-Post: 46 %
FEV6-%Pred-Pre: 40 %
FEV6-Post: 1.48 L
FEV6-Pre: 1.3 L
FEV6FVC-%Pred-Post: 104 %
FEV6FVC-%Pred-Pre: 104 %
FVC-%Change-Post: 16 %
FVC-%Pred-Post: 45 %
FVC-%Pred-Pre: 38 %
FVC-Post: 1.52 L
FVC-Pre: 1.3 L
Post FEV1/FVC ratio: 83 %
Post FEV6/FVC ratio: 100 %
Pre FEV1/FVC ratio: 82 %
Pre FEV6/FVC Ratio: 100 %

## 2017-06-14 MED ORDER — BUDESONIDE-FORMOTEROL FUMARATE 160-4.5 MCG/ACT IN AERO
2.0000 | INHALATION_SPRAY | Freq: Two times a day (BID) | RESPIRATORY_TRACT | 0 refills | Status: DC
Start: 2017-06-14 — End: 2019-10-29

## 2017-06-14 NOTE — Progress Notes (Signed)
Subjective:    Patient ID: Kaylee Hansen, female   DOB: 10-Jun-1956, .   MRN: [id number]     Brief patient profile:  17 yowf active smoker in w/c x 2013 and variable sob in w/c with GOLD O copd on initial pulmonary eval referred to pulmonary clinic 05/02/2017 by NP Wilfred Lacy with documented GOLD 0-copd with reversibility 06/14/2017  Typical of AB    History of Present Illness  05/02/2017 1st [facility] office visit/ [facility]   Chief Complaint  Patient presents with  . Pulmonary Consult    Referred by Wilfred Lacy, PA. Pt c/o SOB x 2 wks- out of breath walking any distance. She also c/o cough- prod with green to clear sputum.  She states she feels "squeezing" feeling in her chest.  She is using her albuterol inhaler 2 x daily on average and neb with albuterol 2-5 x per wk.   doe x 5 ft x 5 years  Sleeping 2 big pillows / 2.5 lpm with am congestion/ thick green mucus  Last time breathing was better than nl = 4 weeks prior to OV  On  saba only   And  now sob at rest on symb/ spiriva and two different forms of saba > very confused with maint vs prn concept rec Plan A = Automatic = Symbicort 160 Take 2 puffs first thing in am and then another 2 puffs about 12 hours later spiriva 2 puffs each am  Work on inhaler technique:  Plan B = Backup Only use your albuterol as a rescue medication  Plan C = Crisis - only use your albuterol nebulizer if you first try Plan B Prednisone 10 mg take  4 each am x 2 days,   2 each am x 2 days,  1 each am x 2 days and stop  Augmentin 875 mg take one pill twice daily  X 10 days.  For cough > mucinex or mucinex dm 1200 mg every 12 hours and use the flutter valve as much as possible  Pantoprazole (protonix) 40 mg   Take  30-60 min before first meal of the day and Pepcid (famotidine)  20 mg one @  bedtime until return to office - GERD diet The key is to stop smoking completely before smoking completely stops you!  Please schedule a follow up office visit  in 4 weeks, sooner if needed with pfts and bring all meds in 2 bags, your automatics vs your as needed     06/14/2017  f/u ov/[facility] re:  GOLD II/III with reversibility  Chief Complaint  Patient presents with  . Follow-up    COPD follow up - reports breathing is unchanged since last ov with dyspnea, wheezing, tightness and prod cough with clear mucus  no insight at all into meds " I take what my husband puts out"  - he's no help, did not bring the meds as requests and says frankly doesn't know why that's important or necessary.  She doesn't recall any of the instructions given at last ov Says still sob x 5 ft  Cannot afford spiriva but thinks it worked better than anoro   No obvious day to day or daytime variability or assoc  purulent sputum or mucus plugs or hemoptysis or overt sinus or hb symptoms. No unusual exposure hx or h/o childhood pna/ asthma or knowledge of premature birth.  Sleeping ok flat without nocturnal  or early am exacerbation  of respiratory  c/o's or need for noct saba.  Also denies any obvious fluctuation of symptoms with weather or environmental changes or other aggravating or alleviating factors except as outlined above   Current Allergies, Complete Past Medical History, Past Surgical History, Family History, and Social History were reviewed in [employer] record.  ROS  The following are not active complaints unless bolded Hoarseness, sore throat, dysphagia, dental problems, itching, sneezing,  nasal congestion or discharge of excess mucus or purulent secretions, ear ache,   fever, chills, sweats, unintended wt loss or wt gain, classically pleuritic or exertional cp,  orthopnea pnd or hand and feet swelling, presyncope, palpitations, abdominal pain, anorexia, nausea, vomiting, diarrhea  or change in bowel habits or change in bladder habits, change in stools or change in urine, dysuria, hematuria,  rash, arthralgias, visual complaints, headache,  numbness, weakness or ataxia or problems with walking or coordination,  change in mood/affect or memory.        Current Meds  Medication Sig  . albuterol (PROVENTIL HFA;VENTOLIN HFA) 108 (90 Base) MCG/ACT inhaler Inhale 2 puffs into the lungs every 6 (six) hours as needed for wheezing.  [name] albuterol (PROVENTIL) (2.5 MG/3ML) 0.083% nebulizer solution Take 3 mLs (2.5 mg total) by nebulization 4 (four) times daily. (Patient taking differently: Take 2.5 mg by nebulization every 6 (six) hours as needed for wheezing or shortness of breath. )  . ALPRAZolam (XANAX) 0.25 MG tablet Take 1 tablet (0.25 mg total) by mouth at bedtime as needed for anxiety.  [name] aspirin EC 81 MG tablet Take 1 tablet (81 mg total) by mouth daily.  . benzonatate (TESSALON) 100 MG capsule Take 1 capsule (100 mg total) by mouth 3 (three) times daily as needed for cough.  . budesonide-formoterol (SYMBICORT) 160-4.5 MCG/ACT inhaler Inhale 2 puffs into the lungs 2 (two) times daily at 10 AM and 5 PM. Rinse mouth after each use  . Camphor-Menthol-Methyl Sal (HM SALONPAS PAIN RELIEF EX) Apply 1 application topically at bedtime as needed (pain relief).  . cycloSPORINE (RESTASIS) 0.05 % ophthalmic emulsion Place 1 drop into both eyes 2 (two) times daily.  . famotidine (PEPCID) 20 MG tablet One at bedtime  . furosemide (LASIX) 20 MG tablet Take 1 tablet (20 mg total) by mouth daily.  [name] guaiFENesin (MUCINEX) 600 MG 12 hr tablet Take 1 tablet (600 mg total) by mouth 2 (two) times daily as needed for cough or to loosen phlegm.  [name] HYDROcodone-acetaminophen (NORCO) 10-325 MG tablet Take 1 tablet by mouth every 6 (six) hours as needed for moderate pain or severe pain. Please fill on or after 08/20/17  . isosorbide mononitrate (IMDUR) 30 MG 24 hr tablet Take 1 tablet (30 mg total) by mouth daily.  [name] levothyroxine (SYNTHROID, LEVOTHROID) 150 MCG tablet Take 1 tablet (150 mcg total) by mouth daily.  [name] linaclotide (LINZESS) 290 MCG CAPS capsule TAKE ONE  CAPSULE BY MOUTH BEFORE BREAKFAST  . nitroGLYCERIN (NITROSTAT) 0.4 MG SL tablet Place 1 tablet (0.4 mg total) under the tongue every 5 (five) minutes as needed for chest pain.  . NON FORMULARY 2.5 lpm with sleep only  . pantoprazole (PROTONIX) 40 MG tablet Take 1 tablet (40 mg total) by mouth daily. Take 30-60 min before first meal of the day  . PARoxetine (PAXIL) 40 MG tablet TAKE 0.5 tablet BY MOUTH DAILY.  [name] phenytoin (DILANTIN) 100 MG ER capsule 100 mg every morning and 200 mg every night.  . predniSONE (DELTASONE) 10 MG tablet Take  4 each am x 2 days,  2 each am x 2 days,  1 each am x 2 days and stop  . promethazine (PHENERGAN) 25 MG tablet Take 1 tablet (25 mg total) by mouth daily. (Patient taking differently: Take 25 mg by mouth every 6 (six) hours as needed for nausea or vomiting. )  . Respiratory Therapy Supplies (FLUTTER) DEVI As directed  . SSD 1 % cream Apply topically daily. Apply to burns  . SUMAtriptan (IMITREX) 100 MG tablet TAKE ONE TABLET BY MOUTH EVERY 2 HOURS AS NEEDED FOR  MIGRAINE  OR  HEADACHE.  MAY  REPEAT  IN  2  HOURS  IF  HEADACHE  PERSISTS  OR  RECUR  . traZODone (DESYREL) 100 MG tablet Take 1 tablet (100 mg total) by mouth at bedtime.  . Vitamin D, Ergocalciferol, (DRISDOL) 50000 units CAPS capsule Take 1 capsule (50,000 Units total) by mouth once a week.  . [  budesonide-formoterol (SYMBICORT) 160-4.5 MCG/ACT inhaler Inhale 2 puffs 2 (two) times daily at 10 AM and 5 PM into the lungs. Rinse mouth after each use                      Objective:   Physical Exam    depressed w/c bound wf nad   06/14/2017      171   05/02/17 169 lb (76.7 kg)  04/24/17 172 lb (78 kg)  04/04/17 170 lb (77.1 kg)    Vital signs reviewed - Note on arrival 02 sats  99% on RA     HEENT: nl  turbinates bilaterally, and oropharynx. Nl external ear canals without cough reflex - edentulous with dentures in place.LUNGS: no acc muscle use,  Nl contour chest with early bilateral exp  sonorous rhonchi/ coughing fits better with plm   HEENT: nl   turbinates bilaterally, and oropharynx. Nl external ear canals without cough reflex - edentulous    NECK :  without JVD/Nodes/TM/ nl carotid upstrokes bilaterally   LUNGS: no acc muscle use,  Nl contour chest with mid exp rhonchi bilaterally better with plm    CV:  RRR  no s3 or murmur or increase in P2, and no edema   ABD:  Obese but soft and nontender with   Limited inspiratory excursion in the supine position. No bruits or organomegaly appreciated, bowel sounds nl  MS:   ext warm without deformities, calf tenderness, cyanosis or clubbing No obvious joint restrictions   SKIN: warm and dry without lesions    NEURO:  alert, approp, nl sensorium with  no motor or cerebellar deficits apparent.              Assessment:

## 2017-06-14 NOTE — Patient Instructions (Addendum)
You barely have  copd and the only way to prevent progression is to stop smoking now - IT IS NOT TOO LATE TO HELP YOU!!!   Plan A = Automatic = Symbicort 160 Take 2 puffs first thing in am and then another 2 puffs about 12 hours later     Work on inhaler technique:  relax and gently blow all the way out then take a nice smooth deep breath back in, triggering the inhaler at same time you start breathing in.  Hold for up to 5 seconds if you can. Blow out thru nose. Rinse and gargle with water when done     Plan B = Backup Only use your albuterol as a rescue medication to be used if you can't catch your breath by resting or doing a relaxed purse lip breathing pattern.  - The less you use it, the better it will work when you need it. - Ok to use the inhaler up to 2 puffs  every 4 hours if you must but call for appointment if use goes up over your usual need - Don't leave home without it !!  (think of it like the spare tire for your car)   Plan C = Crisis - only use your albuterol nebulizer if you first try Plan B and it fails to help > ok to use the nebulizer up to every 4 hours but if start needing it regularly call for immediate appointment  For cough > mucinex or mucinex dm 1200 mg every 12 hours and use the flutter valve as much as possible    Pantoprazole (protonix) 40 mg   Take  30-60 min before first meal of the day and Pepcid (famotidine)  20 mg one @  bedtime until return to office - this is the best way to tell whether stomach acid is contributing to your problem.    GERD (REFLUX)  is an extremely common cause of respiratory symptoms just like yours , many times with no obvious heartburn at all.    It can be treated with medication, but also with lifestyle changes including elevation of the head of your bed (ideally with 6 inch  bed blocks),  Smoking cessation, avoidance of late meals, excessive alcohol, and avoid fatty foods, chocolate, peppermint, colas, red wine, and acidic juices  such as orange juice.  NO MINT OR MENTHOL PRODUCTS SO NO COUGH DROPS   USE SUGARLESS CANDY INSTEAD (Jolley ranchers or Stover's or Life Savers) or even ice chips will also do - the key is to swallow to prevent all throat clearing. NO OIL BASED VITAMINS - use powdered substitutes.    See Tammy NP 4  weeks with all your medications, even over the counter meds, separated in two separate bags, the ones you take no matter what vs the ones you stop once you feel better and take only as needed when you feel you need them.   Tammy  will generate for you a new user friendly medication calendar that will put [place] all on the same page re: your medication use.     Without this process, it simply isn't possible to assure that we are providing  your outpatient care  with  the attention to detail we feel you deserve.   If we cannot assure that you're getting that kind of care,  then we cannot manage your problem effectively from this clinic and you will need to seek pulmonary care elsewhere     Once you have  seen Tammy and we are sure that we're all on the same page with your medication use she will arrange follow up with me.

## 2017-06-14 NOTE — Progress Notes (Signed)
PFT done today. 

## 2017-06-14 NOTE — Assessment & Plan Note (Addendum)
Spirometry 05/02/2017  FEV1 0.96 (37%)  Ratio 78 with atypical curvature  - 05/02/2017  Try symbicort 160 2bid - PFT's  06/14/2017  FEV1 1.26 (49 % ) ratio 83  p 18  % improvement from saba p nothing prior to study - could not do dlco / minimal curvature on p saba f/v  And ERV 13  - 06/14/2017  After extensive coaching inhaler device  effectiveness =    50% > try continue symb 160 2bid as can't afford spiriva but probably does not need it    Clearly Symptoms are markedly disproportionate to objective findings and not clear this is actually much of a  lung problem but pt does appear to have difficult to sort out respiratory symptoms of unknown origin for which  DDX  = almost all start with A and  include Adherence, Ace Inhibitors, Acid Reflux, Active Sinus Disease, Alpha 1 Antitripsin deficiency, Anxiety masquerading as Airways dz,  ABPA,  Allergy(esp in young), Aspiration (esp in elderly), Adverse effects of meds,  Active smokers, A bunch of PE's (a small clot burden can't cause this syndrome unless there is already severe underlying pulm or vascular dz with poor reserve) plus two Bs  = Bronchiectasis and Beta blocker use..and one C= CHF     Adherence is always the initial "prime suspect" and is a multilayered concern that requires a "trust but verify" approach in every patient - starting with knowing how to use medications, especially inhalers, correctly, keeping up with refills and understanding the fundamental difference between maintenance and prns vs those medications only taken for a very short course and then stopped and not refilled.  - see hfa teaching  - if wants our help further will need to meet [place] half way and return with all meds in hand using a trust but verify approach to confirm accurate Medication  Reconciliation The principal here is that until we are certain that the  patients are doing what we've asked, it makes no sense to ask them to do more.   ? Acid (or non-acid) GERD > always  difficult to exclude as up to 75% of pts in some series report no assoc GI/ Heartburn symptoms> rec continue max (24h)  acid suppression and diet restrictions/ reviewed     Active smoking >  (see separate a/p)   ? Anxiety/depresssion severe deconditioning > usually at the bottom of this list of usual suspects but should be much higher on this pt's based on H and P and note already on psychotropics .    I had an extended discussion with the patient/husband (who was frankly obstructionist in term of her care)  reviewing all relevant studies completed to date and  lasting 25 minutes of a 40  minute extened office  visit  Attempting to address severe non-specific but potentially very serious refractory respiratory symptoms of uncertain and potentially multiple  etiologies.   Each maintenance medication was reviewed in detail including most importantly the difference between maintenance and prns and under what circumstances the prns are to be triggered using an action plan format that is not reflected in the computer generated alphabetically organized AVS.    Please see AVS for specific instructions unique to this office visit that I personally wrote and verbalized to the the pt in detail and then reviewed with pt  by my nurse highlighting any changes in therapy/plan of care  recommended at today's visit.

## 2017-06-14 NOTE — Assessment & Plan Note (Signed)

## 2017-06-15 DIAGNOSIS — R079 Chest pain, unspecified: Secondary | ICD-10-CM | POA: Diagnosis not present

## 2017-06-15 DIAGNOSIS — E785 Hyperlipidemia, unspecified: Secondary | ICD-10-CM | POA: Diagnosis not present

## 2017-06-15 DIAGNOSIS — Z79899 Other long term (current) drug therapy: Secondary | ICD-10-CM | POA: Diagnosis not present

## 2017-06-15 DIAGNOSIS — R011 Cardiac murmur, unspecified: Secondary | ICD-10-CM | POA: Diagnosis not present

## 2017-06-15 DIAGNOSIS — Z01818 Encounter for other preprocedural examination: Secondary | ICD-10-CM | POA: Diagnosis not present

## 2017-06-16 LAB — BASIC METABOLIC PANEL
BUN/Creatinine Ratio: 9 — ABNORMAL LOW (ref 12–28)
BUN: 9 mg/dL (ref 8–27)
CO2: 26 mmol/L (ref 20–29)
Calcium: 9.3 mg/dL (ref 8.7–10.3)
Chloride: 101 mmol/L (ref 96–106)
Creatinine, Ser: 1 mg/dL (ref 0.57–1.00)
GFR calc Af Amer: 69 mL/min/{1.73_m2} (ref >59)
GFR calc non Af Amer: 60 mL/min/{1.73_m2} (ref >59)
Glucose: 109 mg/dL — ABNORMAL HIGH (ref 65–99)
Potassium: 4.5 mmol/L (ref 3.5–5.2)
Sodium: 143 mmol/L (ref 134–144)

## 2017-06-16 LAB — LIPID PANEL
Chol/HDL Ratio: 5.8 ratio — ABNORMAL HIGH (ref 0.0–4.4)
Cholesterol, Total: 320 mg/dL — ABNORMAL HIGH (ref 100–199)
HDL: 55 mg/dL (ref >39)
LDL Calculated: 232 mg/dL — ABNORMAL HIGH (ref 0–99)
Triglycerides: 164 mg/dL — ABNORMAL HIGH (ref 0–149)
VLDL Cholesterol Cal: 33 mg/dL (ref 5–40)

## 2017-06-16 LAB — HEPATIC FUNCTION PANEL
ALT: 8 IU/L (ref 0–32)
AST: 10 IU/L (ref 0–40)
Albumin: 3.7 g/dL (ref 3.6–4.8)
Alkaline Phosphatase: 115 IU/L (ref 39–117)
Bilirubin Total: <0.2 mg/dL (ref 0.0–1.2)
Bilirubin, Direct: 0.04 mg/dL (ref 0.00–0.40)
Total Protein: 6.1 g/dL (ref 6.0–8.5)

## 2017-06-18 ENCOUNTER — Telehealth: Payer: Self-pay | Admitting: *Deleted

## 2017-06-18 DIAGNOSIS — C3490 Malignant neoplasm of unspecified part of unspecified bronchus or lung: Secondary | ICD-10-CM | POA: Diagnosis not present

## 2017-06-18 NOTE — Telephone Encounter (Signed)
Spoke to patient. Result given . Verbalized understanding  

## 2017-06-18 NOTE — Telephone Encounter (Signed)
°  New message  Pt verbalized that she is returning call for the rn

## 2017-06-18 NOTE — Telephone Encounter (Signed)
-----   Message from Leonie Man, MD sent at 06/16/2017 11:31 AM EST ----- Cholesterol panel shows total cholesterol 320, LDL of 232, both of these are way above goal.  HDL is okay and triglycerides are close to goal.  Need to see the results of the coronary CT angiogram to determine how aggressive we will need to be treating this.  If there is evidence of heart artery disease, we statins that she is intolerant of.  [name], MD

## 2017-06-18 NOTE — Telephone Encounter (Signed)
Left message to call back- in regards to LAB RESULTS

## 2017-06-20 ENCOUNTER — Emergency Department (HOSPITAL_COMMUNITY): Payer: Medicare HMO

## 2017-06-20 ENCOUNTER — Encounter (HOSPITAL_COMMUNITY): Payer: Self-pay | Admitting: Nurse Practitioner

## 2017-06-20 ENCOUNTER — Other Ambulatory Visit: Payer: Self-pay

## 2017-06-20 ENCOUNTER — Emergency Department (HOSPITAL_COMMUNITY)
Admission: EM | Admit: 2017-06-20 | Discharge: 2017-06-20 | Disposition: A | Discharge status: Home / Self Care | Payer: Medicare HMO | Attending: Emergency Medicine | Admitting: Emergency Medicine

## 2017-06-20 DIAGNOSIS — Z79899 Other long term (current) drug therapy: Secondary | ICD-10-CM | POA: Diagnosis not present

## 2017-06-20 DIAGNOSIS — Z7982 Long term (current) use of aspirin: Secondary | ICD-10-CM | POA: Diagnosis not present

## 2017-06-20 DIAGNOSIS — R079 Chest pain, unspecified: Secondary | ICD-10-CM | POA: Diagnosis not present

## 2017-06-20 DIAGNOSIS — Z9049 Acquired absence of other specified parts of digestive tract: Secondary | ICD-10-CM | POA: Insufficient documentation

## 2017-06-20 DIAGNOSIS — E039 Hypothyroidism, unspecified: Secondary | ICD-10-CM | POA: Diagnosis not present

## 2017-06-20 DIAGNOSIS — F17210 Nicotine dependence, cigarettes, uncomplicated: Secondary | ICD-10-CM | POA: Diagnosis not present

## 2017-06-20 DIAGNOSIS — F419 Anxiety disorder, unspecified: Secondary | ICD-10-CM | POA: Diagnosis not present

## 2017-06-20 DIAGNOSIS — R0602 Shortness of breath: Secondary | ICD-10-CM | POA: Diagnosis not present

## 2017-06-20 DIAGNOSIS — Z85118 Personal history of other malignant neoplasm of bronchus and lung: Secondary | ICD-10-CM | POA: Diagnosis not present

## 2017-06-20 DIAGNOSIS — F329 Major depressive disorder, single episode, unspecified: Secondary | ICD-10-CM | POA: Insufficient documentation

## 2017-06-20 DIAGNOSIS — J441 Chronic obstructive pulmonary disease with (acute) exacerbation: Secondary | ICD-10-CM

## 2017-06-20 DIAGNOSIS — Z96642 Presence of left artificial hip joint: Secondary | ICD-10-CM | POA: Insufficient documentation

## 2017-06-20 DIAGNOSIS — Z8673 Personal history of transient ischemic attack (TIA), and cerebral infarction without residual deficits: Secondary | ICD-10-CM | POA: Insufficient documentation

## 2017-06-20 DIAGNOSIS — R05 Cough: Secondary | ICD-10-CM | POA: Diagnosis not present

## 2017-06-20 DIAGNOSIS — R69 Illness, unspecified: Secondary | ICD-10-CM | POA: Diagnosis not present

## 2017-06-20 LAB — BASIC METABOLIC PANEL
Anion gap: 6 (ref 5–15)
BUN: 10 mg/dL (ref 6–20)
CO2: 29 mmol/L (ref 22–32)
Calcium: 8.7 mg/dL — ABNORMAL LOW (ref 8.9–10.3)
Chloride: 105 mmol/L (ref 101–111)
Creatinine, Ser: 0.9 mg/dL (ref 0.44–1.00)
GFR calc Af Amer: >60 mL/min (ref >60)
GFR calc non Af Amer: >60 mL/min (ref >60)
Glucose, Bld: 110 mg/dL — ABNORMAL HIGH (ref 65–99)
Potassium: 3.6 mmol/L (ref 3.5–5.1)
Sodium: 140 mmol/L (ref 135–145)

## 2017-06-20 LAB — CBC WITH DIFFERENTIAL/PLATELET
Basophils Absolute: 0 10*3/uL (ref 0.0–0.1)
Basophils Relative: 1 %
Eosinophils Absolute: 0.2 10*3/uL (ref 0.0–0.7)
Eosinophils Relative: 3 %
HCT: 32.7 % — ABNORMAL LOW (ref 36.0–46.0)
Hemoglobin: 10.7 g/dL — ABNORMAL LOW (ref 12.0–15.0)
Lymphocytes Relative: 17 %
Lymphs Abs: 1.4 10*3/uL (ref 0.7–4.0)
MCH: 33 pg (ref 26.0–34.0)
MCHC: 32.7 g/dL (ref 30.0–36.0)
MCV: 100.9 fL — ABNORMAL HIGH (ref 78.0–100.0)
Monocytes Absolute: 0.3 10*3/uL (ref 0.1–1.0)
Monocytes Relative: 3 %
Neutro Abs: 6.1 10*3/uL (ref 1.7–7.7)
Neutrophils Relative %: 76 %
Platelets: 246 10*3/uL (ref 150–400)
RBC: 3.24 MIL/uL — ABNORMAL LOW (ref 3.87–5.11)
RDW: 14 % (ref 11.5–15.5)
WBC: 8 10*3/uL (ref 4.0–10.5)

## 2017-06-20 LAB — BRAIN NATRIURETIC PEPTIDE: B Natriuretic Peptide: 139.9 pg/mL — ABNORMAL HIGH (ref 0.0–100.0)

## 2017-06-20 LAB — TROPONIN I
Troponin I: <0.03 ng/mL (ref <0.03)
Troponin I: <0.03 ng/mL (ref <0.03)

## 2017-06-20 MED ORDER — PREDNISONE 10 MG PO TABS: 60.0000 mg | ORAL_TABLET | Freq: Every day | ORAL | 0 refills | Status: DC

## 2017-06-20 MED ORDER — HYDROCODONE-ACETAMINOPHEN 5-325 MG PO TABS
2.0000 | ORAL_TABLET | Freq: Once | ORAL | Status: AC
  Administered 2017-06-20: 2 via ORAL
  Filled 2017-06-20: qty 2

## 2017-06-20 MED ORDER — DEXTROSE 5 % IV SOLN
100.0000 mg | Freq: Once | INTRAVENOUS | Status: AC
  Administered 2017-06-20: 100 mg via INTRAVENOUS
  Filled 2017-06-20: qty 100

## 2017-06-20 MED ORDER — IPRATROPIUM BROMIDE 0.02 % IN SOLN
0.5000 mg | Freq: Once | RESPIRATORY_TRACT | Status: AC
  Administered 2017-06-20: 0.5 mg via RESPIRATORY_TRACT
  Filled 2017-06-20: qty 2.5

## 2017-06-20 MED ORDER — METHYLPREDNISOLONE SODIUM SUCC 125 MG IJ SOLR: 125.0000 mg | Freq: Once | INTRAMUSCULAR | Status: DC

## 2017-06-20 MED ORDER — DOXYCYCLINE HYCLATE 100 MG PO CAPS: 100.0000 mg | ORAL_CAPSULE | Freq: Two times a day (BID) | ORAL | 0 refills | Status: DC

## 2017-06-20 MED ORDER — ALBUTEROL SULFATE (2.5 MG/3ML) 0.083% IN NEBU
5.0000 mg | INHALATION_SOLUTION | Freq: Once | RESPIRATORY_TRACT | Status: AC
  Administered 2017-06-20: 5 mg via RESPIRATORY_TRACT
  Filled 2017-06-20: qty 6
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
## 2017-06-20 NOTE — ED Triage Notes (Signed)
Patient has been having chest tightness for a couple days now. Patient has seen her pulmonologist and cardiologist to see where the discomfort is coming from. Patient took Nitro and it eased off again. Patient was SOB when EMS arrived and they gave here a 15ml Albuterol and 125mg  solumedrol. Chest tightness and discomfort eased off when she started to breathe better.

## 2017-06-20 NOTE — Discharge Instructions (Signed)
We saw you in the ER for the chest pain/shortness of breath. All of our cardiac workup is normal. We are not sure what is causing your discomfort, but we feel comfortable sending you home at this time. The workup in the ER is not complete, and you should follow up with your primary care doctor for further evaluation.  Please return to the ER if you have worsening chest pain, shortness of breath, pain radiating to your jaw, shoulder, or back, sweats or fainting. Otherwise see the Cardiologist or your primary care doctor as requested.  Please refrain from smoking.

## 2017-06-21 ENCOUNTER — Encounter: Payer: Self-pay | Admitting: *Deleted

## 2017-06-22 ENCOUNTER — Telehealth: Payer: Self-pay | Admitting: Cardiology

## 2017-06-22 ENCOUNTER — Ambulatory Visit (HOSPITAL_COMMUNITY)
Admission: RE | Admit: 2017-06-22 | Discharge: 2017-06-22 | Disposition: A | Discharge status: Home / Self Care | Payer: Medicare HMO | Source: Ambulatory Visit | Attending: Cardiology | Admitting: Cardiology

## 2017-06-22 DIAGNOSIS — I2510 Atherosclerotic heart disease of native coronary artery without angina pectoris: Secondary | ICD-10-CM | POA: Diagnosis not present

## 2017-06-22 DIAGNOSIS — R079 Chest pain, unspecified: Secondary | ICD-10-CM

## 2017-06-22 DIAGNOSIS — R911 Solitary pulmonary nodule: Secondary | ICD-10-CM | POA: Insufficient documentation

## 2017-06-22 DIAGNOSIS — J9811 Atelectasis: Secondary | ICD-10-CM | POA: Insufficient documentation

## 2017-06-22 MED ORDER — NITROGLYCERIN 0.4 MG SL SUBL
SUBLINGUAL_TABLET | SUBLINGUAL | Status: AC
  Administered 2017-06-22: 0.4 mg via SUBLINGUAL
  Filled 2017-06-22: qty 1

## 2017-06-22 MED ORDER — IOPAMIDOL (ISOVUE-370) INJECTION 76%
INTRAVENOUS | Status: AC
  Administered 2017-06-22: 100 mL
  Filled 2017-06-22: qty 100

## 2017-06-22 MED ORDER — METOPROLOL TARTRATE 5 MG/5ML IV SOLN
5.0000 mg | INTRAVENOUS | Status: DC | PRN
  Administered 2017-06-22 (×4): 5 mg via INTRAVENOUS
  Filled 2017-06-22 (×5): qty 5

## 2017-06-22 MED ORDER — METOPROLOL TARTRATE 5 MG/5ML IV SOLN
INTRAVENOUS | Status: AC
  Administered 2017-06-22: 5 mg via INTRAVENOUS
  Filled 2017-06-22: qty 5

## 2017-06-22 MED ORDER — METOPROLOL TARTRATE 5 MG/5ML IV SOLN
INTRAVENOUS | Status: AC
  Administered 2017-06-22: 5 mg via INTRAVENOUS
  Filled 2017-06-22: qty 15

## 2017-06-22 MED ORDER — NITROGLYCERIN 0.4 MG SL SUBL
0.4000 mg | SUBLINGUAL_TABLET | Freq: Once | SUBLINGUAL | Status: AC
  Administered 2017-06-22: 0.4 mg via SUBLINGUAL
  Filled 2017-06-22: qty 25

## 2017-06-22 NOTE — Telephone Encounter (Signed)
Radiology called to state that the results of the patient's cardiac ct were available. They wanted Dr. Ellyn Hack to be aware of the following:  IMPRESSION: Irregular Lingular nodule. Non-contrast chest CT at 6-12 months is recommended. If the nodule is stable at time of repeat CT, then future CT at 18-24 months (from today's scan) is considered optional for low-risk patients, but is recommended for high-risk patients.  Message will be routed to him so that he is aware.

## 2017-06-22 NOTE — Telephone Encounter (Signed)
Results seen @ 6 pm.   Will need to review old records to see if this is new. [name], MD

## 2017-06-23 DIAGNOSIS — R079 Chest pain, unspecified: Secondary | ICD-10-CM | POA: Diagnosis not present

## 2017-06-25 NOTE — Telephone Encounter (Signed)
Spoke to patient. Informed patient partial report of results CT- in regards to lingular nodule.  patient aware will discuss at appointment at 07/06/17.  Patient has appointment with primary on 07/02/17-  May discuss at that time .  patient verbalized understanding.

## 2017-06-25 NOTE — Telephone Encounter (Signed)
Follow up     Patient is following up on her test results, said that someone called her this morning

## 2017-07-02 ENCOUNTER — Encounter: Payer: Self-pay | Admitting: Internal Medicine

## 2017-07-02 ENCOUNTER — Ambulatory Visit (INDEPENDENT_AMBULATORY_CARE_PROVIDER_SITE_OTHER): Payer: Medicare HMO | Admitting: Internal Medicine

## 2017-07-02 DIAGNOSIS — J440 Chronic obstructive pulmonary disease with acute lower respiratory infection: Secondary | ICD-10-CM

## 2017-07-02 DIAGNOSIS — E034 Atrophy of thyroid (acquired): Secondary | ICD-10-CM | POA: Diagnosis not present

## 2017-07-02 DIAGNOSIS — R911 Solitary pulmonary nodule: Secondary | ICD-10-CM

## 2017-07-02 DIAGNOSIS — J449 Chronic obstructive pulmonary disease, unspecified: Secondary | ICD-10-CM

## 2017-07-02 DIAGNOSIS — F17210 Nicotine dependence, cigarettes, uncomplicated: Secondary | ICD-10-CM

## 2017-07-02 DIAGNOSIS — F321 Major depressive disorder, single episode, moderate: Secondary | ICD-10-CM

## 2017-07-02 DIAGNOSIS — R69 Illness, unspecified: Secondary | ICD-10-CM | POA: Diagnosis not present

## 2017-07-02 MED ORDER — CYCLOSPORINE 0.05 % OP EMUL: 1.0000 [drp] | Freq: Two times a day (BID) | OPHTHALMIC | 5 refills | Status: DC

## 2017-07-02 MED ORDER — LEVOTHYROXINE SODIUM 150 MCG PO TABS: 150.0000 ug | ORAL_TABLET | Freq: Every day | ORAL | 3 refills | Status: DC

## 2017-07-02 MED ORDER — ALBUTEROL SULFATE HFA 108 (90 BASE) MCG/ACT IN AERS: 2.0000 | INHALATION_SPRAY | Freq: Four times a day (QID) | RESPIRATORY_TRACT | 5 refills | Status: DC | PRN

## 2017-07-02 MED ORDER — ALBUTEROL SULFATE (2.5 MG/3ML) 0.083% IN NEBU: 2.5000 mg | INHALATION_SOLUTION | Freq: Four times a day (QID) | RESPIRATORY_TRACT | 11 refills | Status: DC

## 2017-07-02 MED ORDER — PANTOPRAZOLE SODIUM 40 MG PO TBEC: 40.0000 mg | DELAYED_RELEASE_TABLET | Freq: Every day | ORAL | 3 refills | Status: DC

## 2017-07-02 MED ORDER — PAROXETINE HCL 40 MG PO TABS: 40.0000 mg | ORAL_TABLET | Freq: Every day | ORAL | 3 refills | Status: DC

## 2017-07-02 NOTE — Assessment & Plan Note (Signed)
06/23/17 chest CT IMPRESSION: Irregular Lingular nodule. Non-contrast chest CT at 6-12 months is recommended. If the nodule is stable at time of repeat CT, then future CT at 18-24 months (from today's scan) is considered optional for low-risk patients, but is recommended for high-risk patients. This recommendation follows the consensus statement: Guidelines for Management of Incidental Pulmonary Nodules Detected on CT Images: From the Fleischner Society 2017; Radiology 2017; 284:228-243.  These results will be called to the ordering clinician or representative by the Radiologist Assistant, and communication documented in the PACS or zVision Dashboard.  Electronically Signed: By: Suzy Bouchard M.D. On: 06/22/2017 16:19   Repeat chest CT in 6 months

## 2017-07-02 NOTE — Assessment & Plan Note (Signed)
Paxil 

## 2017-07-02 NOTE — Progress Notes (Signed)
Subjective:  Patient ID: Kaylee Hansen, female    DOB: Nov 18, 1955  Age: 62 y.o. MRN: [id number]  CC: No chief complaint on file.   HPI DEAISA MERIDA presents for COPD exacerbation (ER on 06/20/17) - given Prednisone and doxycycline. F/u abn CXR. Cont to smoke. F/u LBP  Outpatient Medications Prior to Visit  Medication Sig Dispense Refill  . albuterol (PROVENTIL HFA;VENTOLIN HFA) 108 (90 Base) MCG/ACT inhaler Inhale 2 puffs into the lungs every 6 (six) hours as needed for wheezing. 1 Inhaler 5  . albuterol (PROVENTIL) (2.5 MG/3ML) 0.083% nebulizer solution Take 3 mLs (2.5 mg total) by nebulization 4 (four) times daily. (Patient taking differently: Take 2.5 mg by nebulization every 6 (six) hours as needed for wheezing or shortness of breath. ) 120 vial 11  . ALPRAZolam (XANAX) 0.25 MG tablet Take 1 tablet (0.25 mg total) by mouth at bedtime as needed for anxiety. 90 tablet 1  . aspirin EC 81 MG tablet Take 1 tablet (81 mg total) by mouth daily. 90 tablet 3  . benzonatate (TESSALON) 100 MG capsule Take 1 capsule (100 mg total) by mouth 3 (three) times daily as needed for cough. 20 capsule 0  . budesonide-formoterol (SYMBICORT) 160-4.5 MCG/ACT inhaler Inhale 2 puffs into the lungs 2 (two) times daily at 10 AM and 5 PM. Rinse mouth after each use 1 Inhaler 0  . Camphor-Menthol-Methyl Sal (HM SALONPAS PAIN RELIEF EX) Apply 1 application topically at bedtime as needed (pain relief).    . cycloSPORINE (RESTASIS) 0.05 % ophthalmic emulsion Place 1 drop into both eyes 2 (two) times daily. (Patient taking differently: Place 1 drop into both eyes 2 (two) times daily as needed (irritation). ) 0.4 mL 2  . doxycycline (VIBRAMYCIN) 100 MG capsule Take 1 capsule (100 mg total) by mouth 2 (two) times daily. 14 capsule 0  . Doxylamine Succinate, Sleep, (SLEEP AID PO) Take 2 tablets by mouth at bedtime.    . famotidine (PEPCID) 20 MG tablet One at bedtime (Patient taking differently: Take 20 mg by mouth at  bedtime. One at bedtime) 30 tablet 2  . furosemide (LASIX) 20 MG tablet Take 1 tablet (20 mg total) by mouth daily. 30 tablet 6  . guaiFENesin (MUCINEX) 600 MG 12 hr tablet Take 1 tablet (600 mg total) by mouth 2 (two) times daily as needed for cough or to loosen phlegm. 14 tablet 0  . HYDROcodone-acetaminophen (NORCO) 10-325 MG tablet Take 1 tablet by mouth every 6 (six) hours as needed for moderate pain or severe pain. Please fill on or after 08/20/17 120 tablet 0  . isosorbide mononitrate (IMDUR) 30 MG 24 hr tablet Take 1 tablet (30 mg total) by mouth daily. 30 tablet 6  . levothyroxine (SYNTHROID, LEVOTHROID) 150 MCG tablet Take 1 tablet (150 mcg total) by mouth daily. 90 tablet 3  . linaclotide (LINZESS) 290 MCG CAPS capsule TAKE ONE CAPSULE BY MOUTH BEFORE BREAKFAST (Patient taking differently: Take 290 mcg by mouth 3 (three) times a week. TAKE ONE CAPSULE BY MOUTH BEFORE BREAKFAST ON MWF.) 30 capsule 2  . nitroGLYCERIN (NITROSTAT) 0.4 MG SL tablet Place 1 tablet (0.4 mg total) under the tongue every 5 (five) minutes as needed for chest pain. 25 tablet 5  . NON FORMULARY 2.5 lpm with sleep only    . pantoprazole (PROTONIX) 40 MG tablet Take 1 tablet (40 mg total) by mouth daily. Take 30-60 min before first meal of the day 90 tablet 3  . PARoxetine (PAXIL)  40 MG tablet TAKE 0.5 tablet BY MOUTH DAILY. (Patient taking differently: Take 40 mg by mouth daily. ) 90 tablet 3  . phenytoin (DILANTIN) 100 MG ER capsule 100 mg every morning and 200 mg every night. 270 capsule 3  . predniSONE (DELTASONE) 10 MG tablet Take 6 tablets (60 mg total) by mouth daily. 30 tablet 0  . promethazine (PHENERGAN) 25 MG tablet Take 1 tablet (25 mg total) by mouth daily. (Patient taking differently: Take 25 mg by mouth every 6 (six) hours as needed for nausea or vomiting. ) 90 tablet 1  . Respiratory Therapy Supplies (FLUTTER) DEVI As directed 1 each 0  . SSD 1 % cream Apply topically daily. Apply to burns 50 g 1  .  SUMAtriptan (IMITREX) 100 MG tablet TAKE ONE TABLET BY MOUTH EVERY 2 HOURS AS NEEDED FOR  MIGRAINE  OR  HEADACHE.  MAY  REPEAT  IN  2  HOURS  IF  HEADACHE  PERSISTS  OR  RECUR 9 tablet 2  . traZODone (DESYREL) 100 MG tablet Take 1 tablet (100 mg total) by mouth at bedtime. 90 tablet 3  . Vitamin D, Ergocalciferol, (DRISDOL) 50000 units CAPS capsule Take 1 capsule (50,000 Units total) by mouth once a week. 12 capsule 3  . metoprolol tartrate (LOPRESSOR) 50 MG tablet Take 1 tablet (50 mg total) once for 1 dose by mouth. TAKE ONE HOUR PRIOR TO  SCHEDULE CARDAIC TEST 1 tablet 0   No facility-administered medications prior to visit.     ROS Review of Systems  Constitutional: Positive for fatigue. Negative for activity change, appetite change, chills and unexpected weight change.  HENT: Negative for congestion, mouth sores and sinus pressure.   Eyes: Negative for visual disturbance.  Respiratory: Positive for cough and shortness of breath. Negative for chest tightness.   Gastrointestinal: Negative for abdominal pain and nausea.  Genitourinary: Negative for difficulty urinating, frequency and vaginal pain.  Musculoskeletal: Positive for arthralgias, back pain and gait problem.  Skin: Negative for pallor and rash.  Neurological: Negative for dizziness, tremors, weakness, numbness and headaches.  Psychiatric/Behavioral: Positive for decreased concentration. Negative for confusion, sleep disturbance and suicidal ideas. The patient is nervous/anxious.     Objective:  BP (!) 112/48 (BP Location: Right Arm, Patient Position: Sitting, Cuff Size: Large)   Pulse 82   Temp 99.2 F (37.3 C) (Oral)   Ht 5\' 5"  (1.651 m)   Wt 169 lb (76.7 kg)   SpO2 96%   BMI 28.12 kg/m   BP Readings from Last 3 Encounters:  07/02/17 (!) 112/48  06/22/17 (!) 143/51  06/20/17 138/64    Wt Readings from Last 3 Encounters:  07/02/17 169 lb (76.7 kg)  06/20/17 171 lb (77.6 kg)  06/14/17 171 lb (77.6 kg)     Physical Exam  Constitutional: She appears well-developed. No distress.  HENT:  Head: Normocephalic.  Right Ear: External ear normal.  Left Ear: External ear normal.  Nose: Nose normal.  Mouth/Throat: Oropharynx is clear and moist.  Eyes: Conjunctivae are normal. Pupils are equal, round, and reactive to light. Right eye exhibits no discharge. Left eye exhibits no discharge.  Neck: Normal range of motion. Neck supple. No JVD present. No tracheal deviation present. No thyromegaly present.  Cardiovascular: Normal rate, regular rhythm and normal heart sounds.  Pulmonary/Chest: No stridor. No respiratory distress. She has wheezes.  Abdominal: Soft. Bowel sounds are normal. She exhibits no distension and no mass. There is no tenderness. There is no rebound  and no guarding.  Musculoskeletal: She exhibits no edema or tenderness.  Lymphadenopathy:    She has no cervical adenopathy.  Neurological: She displays normal reflexes. No cranial nerve deficit. She exhibits normal muscle tone. Coordination abnormal.  Skin: No rash noted. No erythema.  Psychiatric: She has a normal mood and affect. Her behavior is normal. Judgment and thought content normal.  in a w/c  Lab Results  Component Value Date   WBC 8.0 06/20/2017   HGB 10.7 (L) 06/20/2017   HCT 32.7 (L) 06/20/2017   PLT 246 06/20/2017   GLUCOSE 110 (H) 06/20/2017   CHOL 320 (H) 06/15/2017   TRIG 164 (H) 06/15/2017   HDL 55 06/15/2017   LDLDIRECT 187.0 07/14/2015   LDLCALC 232 (H) 06/15/2017   ALT 8 06/15/2017   AST 10 06/15/2017   NA 140 06/20/2017   K 3.6 06/20/2017   CL 105 06/20/2017   CREATININE 0.90 06/20/2017   BUN 10 06/20/2017   CO2 29 06/20/2017   TSH 0.46 07/14/2015   INR 1.0 09/16/2008   HGBA1C  09/17/2008    5.0 (NOTE)   The ADA recommends the following therapeutic goal for glycemic   control related to Hgb A1C measurement:   Goal of Therapy:   < 7.0% Hgb A1C   Reference: American Diabetes Association: Clinical  Practice   Recommendations 2008, Diabetes Care,  2008, 31:(Suppl 1).    Ct Coronary Morph W/cta Cor W/score W/ca W/cm &/or Wo/cm  Addendum Date: 06/23/2017   ADDENDUM REPORT: 06/23/2017 21:37 CLINICAL DATA:  Chest pain EXAM: Cardiac CTA MEDICATIONS: Sub lingual nitro. 4mg  and lopressor 10mg  IV TECHNIQUE: The patient was scanned on a Siemens 568 slice scanner. Gantry rotation speed was 240 msecs. Collimation was 0.6 mm. A 100 kV prospective scan was triggered in the ascending thoracic aorta at 35-75% of the R-R interval. Average HR during the scan was 65 bpm. The 3D data set was interpreted on a dedicated work station using MPR, MIP and VRT modes. A total of 80cc of contrast was used. FINDINGS: Non-cardiac: See separate report from [facility]. Calcium Score:  2.9 Agatston units. Coronary Arteries: Right dominant with no anomalies LM:  No plaque or stenosis. LAD system: Small D1, large D2. Relatively small LAD beyond D2. No significant plaque or stenosis noted. Circumflex system:  No plaque or stenosis. RCA system: Soft plaque in the proximal RCA with suspected moderate stenosis. IMPRESSION: 1. Coronary artery calcium score 2.9 Agatston units, placing the patient in the 59th percentile for age and gender. This suggests intermediate risk for future cardiac events. 2. Unusual pattern with minimal calcified plaque but concerning for primarily soft plaque in the proximal RCA with suspected moderate stenosis. Will send for CT FFR to confirm significance of RCA disease. Dalton Mclean Electronically Signed   By: Loralie Champagne M.D.   On: 06/23/2017 21:37   Result Date: 06/23/2017 EXAM: OVER-READ INTERPRETATION  CT CHEST The following report is an over-read performed by radiologist Dr. Alvino Blood [facility] on 06/22/2017. This over-read does not include interpretation of cardiac or coronary anatomy or pathology. The coronary CTA interpretation by the cardiologist is attached.  COMPARISON:  None. FINDINGS: Limited view of the lung parenchyma demonstrates an 8 mm nodule in the lingula with indistinct margins (image 29, series 12). Nodule new from comparison CT 2014. Mild atelectasis in the medial LEFT lower lobe. Airways are normal. Limited view of the mediastinum demonstrates no adenopathy. Esophagus normal. Limited view of the upper abdomen unremarkable.  Limited view of the skeleton and chest wall is unremarkable. IMPRESSION: Irregular Lingular nodule. Non-contrast chest CT at 6-12 months is recommended. If the nodule is stable at time of repeat CT, then future CT at 18-24 months (from today's scan) is considered optional for low-risk patients, but is recommended for high-risk patients. This recommendation follows the consensus statement: Guidelines for Management of Incidental Pulmonary Nodules Detected on CT Images: From the Fleischner Society 2017; Radiology 2017; 284:228-243. These results will be called to the ordering clinician or representative by the Radiologist Assistant, and communication documented in the PACS or zVision Dashboard. Electronically Signed: By: Suzy Bouchard M.D. On: 06/22/2017 16:19   Ct Coronary Fractional Flow Reserve Data Prep  Result Date: 06/25/2017 CLINICAL DATA:  Chest pain EXAM: CT FFR MEDICATIONS: No additional meds. TECHNIQUE: Coronary CT sent for CT FFR. FINDINGS: FFR: 0.9 RCA 0.84 distal LAD 0.87 LCx IMPRESSION: FFR data suggests that coronary disease (specifically RCA lesion) is not hemodynamically significant. Dalton Mclean Electronically Signed   By: Loralie Champagne M.D.   On: 06/25/2017 17:56    Assessment & Plan:   There are no diagnoses linked to this encounter. I am having Oluwakemi L. Villagomez maintain her NON FORMULARY, promethazine, albuterol, levothyroxine, cycloSPORINE, SSD, SUMAtriptan, phenytoin, linaclotide, albuterol, Camphor-Menthol-Methyl Sal (HM SALONPAS PAIN RELIEF EX), guaiFENesin, benzonatate, famotidine, FLUTTER,  metoprolol tartrate, isosorbide mononitrate, nitroGLYCERIN, furosemide, aspirin EC, HYDROcodone-acetaminophen, ALPRAZolam, Vitamin D (Ergocalciferol), traZODone, pantoprazole, PARoxetine, budesonide-formoterol, (Doxylamine Succinate, Sleep, (SLEEP AID PO)), predniSONE, and doxycycline.  No orders of the defined types were placed in this encounter.    Follow-up: No Follow-up on file.  Walker Kehr, MD

## 2017-07-02 NOTE — Assessment & Plan Note (Signed)
Exacerbation Finish meds Stop smoking

## 2017-07-02 NOTE — Assessment & Plan Note (Signed)
Levothroid renewed

## 2017-07-02 NOTE — Assessment & Plan Note (Signed)
Discussed.

## 2017-07-02 NOTE — Patient Instructions (Signed)
Repeat chest CT in 6 months

## 2017-07-06 ENCOUNTER — Encounter: Payer: Self-pay | Admitting: Cardiology

## 2017-07-06 ENCOUNTER — Ambulatory Visit: Payer: Medicare HMO | Admitting: Cardiology

## 2017-07-06 VITALS — BP 120/45 | HR 81 | Ht 65.0 in | Wt 169.0 lb

## 2017-07-06 DIAGNOSIS — F17210 Nicotine dependence, cigarettes, uncomplicated: Secondary | ICD-10-CM

## 2017-07-06 DIAGNOSIS — R079 Chest pain, unspecified: Secondary | ICD-10-CM

## 2017-07-06 DIAGNOSIS — J449 Chronic obstructive pulmonary disease, unspecified: Secondary | ICD-10-CM | POA: Diagnosis not present

## 2017-07-06 DIAGNOSIS — I351 Nonrheumatic aortic (valve) insufficiency: Secondary | ICD-10-CM

## 2017-07-06 DIAGNOSIS — Z01818 Encounter for other preprocedural examination: Secondary | ICD-10-CM | POA: Diagnosis not present

## 2017-07-06 DIAGNOSIS — R911 Solitary pulmonary nodule: Secondary | ICD-10-CM

## 2017-07-06 DIAGNOSIS — E785 Hyperlipidemia, unspecified: Secondary | ICD-10-CM | POA: Diagnosis not present

## 2017-07-06 DIAGNOSIS — I10 Essential (primary) hypertension: Secondary | ICD-10-CM

## 2017-07-06 DIAGNOSIS — R69 Illness, unspecified: Secondary | ICD-10-CM | POA: Diagnosis not present

## 2017-07-06 DIAGNOSIS — R0789 Other chest pain: Secondary | ICD-10-CM | POA: Diagnosis not present

## 2017-07-06 MED ORDER — ROSUVASTATIN CALCIUM 20 MG PO TABS: 20.0000 mg | ORAL_TABLET | Freq: Every day | ORAL | 3 refills | Status: DC

## 2017-07-06 NOTE — Patient Instructions (Addendum)
MEDICATIONS  --START CoQ 10 ---- take 100 mg everyday for one week than increase by 100 mg each week until you reach 300 mg.  --- start  Crestor ( Rosuvastatin) 20 mg -for the first 2 weeks take three times a week then increase to once a day .      Labs Will be needed in  MAY 2019,will mail you labslip CMP LIPID    SCHEDULE  AT [place] 2019 Your physician has requested that you have an echocardiogram. Echocardiography is a painless test that uses sound waves to create images of your heart. It provides your doctor with information about the size and shape of your heart and how well your heart's chambers and valves are working. This procedure takes approximately one hour. There are no restrictions for this procedure.   SCHEDULE AT  [facility] - June 2019 CHEST  Non-Cardiac CT scanning, (CAT scanning), is a noninvasive, special x-ray that produces cross-sectional images of the body using x-rays and a computer. CT scans help physicians diagnose and treat medical conditions. For some CT exams, a contrast material is used to enhance visibility in the area of the body being studied. CT scans provide greater clarity and reveal more details than regular x-ray exams.    Your physician wants you to follow-up in June 2019 [place].You will receive a reminder letter in the mail two months in advance. If you don't receive a letter, please call our office to schedule the follow-up appointment.

## 2017-07-06 NOTE — Progress Notes (Signed)
PCP: Plotnikov, [name], MD  Clinic Note: Chief Complaint  Patient presents with  . Follow-up    discuss test results  . Cardiac Valve Problem    Aortic regurgitation    HPI: Kaylee Hansen is a 62 y.o. female -long-term smoker who has significant COPD,.  She is being seen today for the follow-up of evaluation of tightness in the chest at the request of Plotnikov, [name], MD.   [name] has severe COPD from long-term smoking. She is also essentially wheelchair bound because of hip and knee issues, and barely does any activity.  She is profoundly dyspneic with just about any activity but has been noticing several episodes of chest discomfort off and on throughout the course of the day.  She was initially seen on November 9, she complained of chest discomfort and dyspnea.  It was difficult to tell what her symptoms all were.  She definitely had an aortic regurgitation murmur so I ordered an echocardiogram. I saw her again in December for follow-up and reordered coronary CT angiogram.  Recent Hospitalizations:   06/20/2017 ER visit for COPD exacerbation and pleuritic chest pain  Studies Personally Reviewed - (if available, images/films reviewed: From Epic Chart or Care Everywhere)  2D echo 05/15/2017: EF 60-65%.  GR 1 DD.  Mild AS with severe AI (now severe)  Coronary Calcium Score/Coronary CTA December 2018 1. Coronary artery calcium score 2.9 Agatston units, placing the patient in the 59th percentile for age and gender. This suggests intermediate risk for future cardiac events.   2. Unusual pattern with minimal calcified plaque but concerning for primarily soft plaque in the proximal RCA with suspected moderate stenosis --> sent for CT FFR to determine significance of RCA disease -- FFR Negative  Irregular Lingular nodule. Non-contrast chest CT at 6-12 months is recommended. If the nodule is stable at time of repeat CT, then future CT at 18-24 months (from today's scan) is  considered optional for low-risk patients, but is recommended for high-risk patients. This recommendation follows the consensus statement  Interval History: Kaylee Hansen returns here today to discuss results of her findings. She has been the emergency room for COPD since I saw her. She has her baseline resting dyspnea that is worse with exertion. She still notes occasional orthopnea and PND, but this is probably more related to her positioning -- she has a hard time even lying flat because of her back.. She wears oxygen at night and occasionally now than a day. She has baseline coughing which seems to have improved since her ER visit. When she coughs, she has chest pain. She otherwise still has intermittent episodes of chest discomfort off and on. Often after eating. She she is not very exertional, his heart itself is any exertional component.  Remainder of cardiac review of symptoms: No PND, orthopnea or edema.  No palpitations, lightheadedness, or syncope/near syncope. -- She is intermittently dizzy with vertigo. No TIA/amaurosis fugax symptoms. No claudication.  ROS: A comprehensive was performed. Review of Systems  Constitutional: Positive for malaise/fatigue (Chronic.  Essentially wheelchair-bound.  No energy). Negative for chills, fever and weight loss.  HENT: Positive for congestion and sore throat (Hoarse voice). Negative for hearing loss.   Respiratory: Positive for cough (Chronic, but now more frequent), sputum production (Not purulent), shortness of breath (At baseline) and wheezing (Off-and-on).   Cardiovascular: Positive for chest pain (Worse with coughing).  Gastrointestinal: Positive for abdominal pain, constipation, diarrhea and heartburn. Negative for blood in stool and melena.  She has some type of chronic colitis with intermittent abdominal pain and constipation.  Now she is having diarrhea.  Genitourinary: Negative for dysuria, frequency and hematuria.  Musculoskeletal:  Positive for back pain, falls (She does not stand up without assistance), joint pain and myalgias.       Chronic profound hip and knee pain presumably from arthritis.  Does not have strength to stand on her own.  Neurological: Positive for dizziness (Vertigo) and headaches (With congestion). Negative for focal weakness, loss of consciousness and weakness (Lower extremities of the week as part.).  Psychiatric/Behavioral: Positive for depression. Negative for memory loss. The patient is nervous/anxious and has insomnia (Has a hard time falling asleep).   All other systems reviewed and are negative.  I have reviewed and (if needed) personally updated the patient's problem list, medications, allergies, past medical and surgical history, social and family history.   Past Medical History:  Diagnosis Date  . Anxiety   . Collagenous colitis   . Colon adenomas 2011  . Constipation    Chronic abdominal pain and constipation  . COPD (chronic obstructive pulmonary disease) ([place])   . Depression   . Diverticulosis   . Esophageal stricture 11/08/2012  . Esophageal ulcer    04/2009 EGD  . Esophageal ulcer 04/2009  . GERD (gastroesophageal reflux disease)   . Helicobacter pylori gastritis 2010   Pylera Tx  . History of cholecystectomy   . Hx of appendectomy   . Hx of cancer of lung 1999  . Hx of hysterectomy   . Hyperlipidemia   . Hypothyroidism   . Low back pain   . Osteoarthritis of knee    bilateral knee  . Seizures ([place])   . Stroke [facility])    CVA, hx of 97  . 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 ABDOMINAL HYSTERECTOMY     complete 1992  . ABDOMINAL SURGERY     Exploratory  . APPENDECTOMY    . BALLOON DILATION N/A 11/08/2012   Procedure: BALLOON DILATION;  Surgeon: Gatha Mayer, MD;  Location: [facility] ENDOSCOPY;  Service: Endoscopy;  Laterality: N/A;  . CHOLECYSTECTOMY    . COLONOSCOPY W/ BIOPSIES     multiple  . CT CTA CORONARY W/CA SCORE  W/CM &/OR WO/CM  05/2017   Coronary calcium score 2.9. Intermediate risk. Unusual noncalcified plaque in RCA --FFR negative  . ESOPHAGOGASTRODUODENOSCOPY     w/baloon x 2  . ESOPHAGOGASTRODUODENOSCOPY N/A 11/08/2012   Procedure: ESOPHAGOGASTRODUODENOSCOPY (EGD);  Surgeon: Gatha Mayer, MD;  Location: [facility];  Service: Endoscopy;  Laterality: N/A;  . TOTAL HIP ARTHROPLASTY     Left  . TRANSTHORACIC ECHOCARDIOGRAM  04/2017   EF 60-65%. GR 1 DD. Mild AS with SEVERE AI    Current Meds  Medication Sig  . albuterol (PROVENTIL HFA;VENTOLIN HFA) 108 (90 Base) MCG/ACT inhaler Inhale 2 puffs into the lungs every 6 (six) hours as needed for wheezing.  [name] albuterol (PROVENTIL) (2.5 MG/3ML) 0.083% nebulizer solution Take 3 mLs (2.5 mg total) by nebulization 4 (four) times daily.  [name] ALPRAZolam (XANAX) 0.25 MG tablet Take 1 tablet (0.25 mg total) by mouth at bedtime as needed for anxiety.  [name] aspirin EC 81 MG tablet Take 1 tablet (81 mg total) by mouth daily.  . benzonatate (TESSALON) 100 MG capsule Take 1 capsule (100 mg total) by mouth 3 (three) times daily as needed for cough.  . budesonide-formoterol (SYMBICORT) 160-4.5 MCG/ACT inhaler Inhale  2 puffs into the lungs 2 (two) times daily at 10 AM and 5 PM. Rinse mouth after each use  . Camphor-Menthol-Methyl Sal (HM SALONPAS PAIN RELIEF EX) Apply 1 application topically at bedtime as needed (pain relief).  . cycloSPORINE (RESTASIS) 0.05 % ophthalmic emulsion Place 1 drop into both eyes 2 (two) times daily.  [name] doxycycline (VIBRAMYCIN) 100 MG capsule Take 1 capsule (100 mg total) by mouth 2 (two) times daily.  . Doxylamine Succinate, Sleep, (SLEEP AID PO) Take 2 tablets by mouth at bedtime.  . famotidine (PEPCID) 20 MG tablet One at bedtime (Patient taking differently: Take 20 mg by mouth at bedtime. One at bedtime)  . furosemide (LASIX) 20 MG tablet Take 1 tablet (20 mg total) by mouth daily.  [name] guaiFENesin (MUCINEX) 600 MG 12 hr tablet Take 1 tablet  (600 mg total) by mouth 2 (two) times daily as needed for cough or to loosen phlegm.  [name] HYDROcodone-acetaminophen (NORCO) 10-325 MG tablet Take 1 tablet by mouth every 6 (six) hours as needed for moderate pain or severe pain. Please fill on or after 08/20/17  . isosorbide mononitrate (IMDUR) 30 MG 24 hr tablet Take 1 tablet (30 mg total) by mouth daily.  [name] levothyroxine (SYNTHROID, LEVOTHROID) 150 MCG tablet Take 1 tablet (150 mcg total) by mouth daily.  [name] linaclotide (LINZESS) 290 MCG CAPS capsule TAKE ONE CAPSULE BY MOUTH BEFORE BREAKFAST (Patient taking differently: Take 290 mcg by mouth 3 (three) times a week. TAKE ONE CAPSULE BY MOUTH BEFORE BREAKFAST ON MWF.)  . nitroGLYCERIN (NITROSTAT) 0.4 MG SL tablet Place 1 tablet (0.4 mg total) under the tongue every 5 (five) minutes as needed for chest pain.  . NON FORMULARY 2.5 lpm with sleep only  . pantoprazole (PROTONIX) 40 MG tablet Take 1 tablet (40 mg total) by mouth daily. Take 30-60 min before first meal of the day  . PARoxetine (PAXIL) 40 MG tablet Take 1 tablet (40 mg total) by mouth daily.  . phenytoin (DILANTIN) 100 MG ER capsule 100 mg every morning and 200 mg every night.  . predniSONE (DELTASONE) 10 MG tablet Take 6 tablets (60 mg total) by mouth daily.  . promethazine (PHENERGAN) 25 MG tablet Take 1 tablet (25 mg total) by mouth daily. (Patient taking differently: Take 25 mg by mouth every 6 (six) hours as needed for nausea or vomiting. )  . Respiratory Therapy Supplies (FLUTTER) DEVI As directed  . SSD 1 % cream Apply topically daily. Apply to burns  . SUMAtriptan (IMITREX) 100 MG tablet TAKE ONE TABLET BY MOUTH EVERY 2 HOURS AS NEEDED FOR  MIGRAINE  OR  HEADACHE.  MAY  REPEAT  IN  2  HOURS  IF  HEADACHE  PERSISTS  OR  RECUR  . traZODone (DESYREL) 100 MG tablet Take 1 tablet (100 mg total) by mouth at bedtime.  . Vitamin D, Ergocalciferol, (DRISDOL) 50000 units CAPS capsule Take 1 capsule (50,000 Units total) by mouth once a week.     Allergies  Allergen Reactions  . Morphine     "Makes me go crazy."   . Oxycodone Hcl Other (See Comments)    Knocked her out for 6 days  . Penicillins Hives    Has patient had a PCN reaction causing immediate rash, facial/tongue/throat swelling, SOB or lightheadedness with hypotension: unknown Has patient had a PCN reaction causing severe rash involving mucus membranes or skin necrosis: unknown Has patient had a PCN reaction that required hospitalization No Has patient had a  PCN reaction occurring within the last 10 years: No If all of the above answers are "NO", then may proceed with Cephalosporin use.   . Tape Other (See Comments)    Skin turns red and burns    Social History   Socioeconomic History  . Marital status: Married    Spouse name: None  . Number of children: 2  . Years of education: None  . Highest education level: None  Social Needs  . Financial resource strain: None  . Food insecurity - worry: None  . Food insecurity - inability: None  . Transportation needs - medical: None  . Transportation needs - non-medical: None  Occupational History  . Occupation: disabled    Employer: DISABLED  Tobacco Use  . Smoking status: Current Every Day Smoker    Packs/day: 1.00    Years: 48.00    Pack years: 48.00    Types: Cigarettes  . Smokeless tobacco: Never Used  Substance and Sexual Activity  . Alcohol use: No  . Drug use: No  . Sexual activity: Not Currently  Other Topics Concern  . None  Social History Narrative   Married   2 children   No regular exercise          family history includes Coronary artery disease in her mother and other; Diabetes in her mother and son; Hypertension in her father; Kidney cancer in her sister; Kidney disease in her other.  Wt Readings from Last 3 Encounters:  07/06/17 169 lb (76.7 kg)  07/02/17 169 lb (76.7 kg)  06/20/17 171 lb (77.6 kg)    PHYSICAL EXAM BP (!) 120/45   Pulse 81   Ht 5\' 5"  (1.651 m)   Wt 169 lb  (76.7 kg)   BMI 28.12 kg/m  Physical Exam  Constitutional: She is oriented to person, place, and time. She appears well-nourished. No distress (Chronically ill-appearing).  She looks older than her stated age.  She is in a wheelchair, profoundly weak with increased work of breathing.  She is well-groomed.  HENT:  Head: Normocephalic and atraumatic.  Mouth/Throat: No oropharyngeal exudate.  Neck: Neck supple. No JVD present. Carotid bruit is present (Left> right).  Cardiovascular: Normal rate and regular rhythm.  Occasional extrasystoles are present. PMI is not displaced. Exam reveals distant heart sounds and decreased pulses (Decreased bilateral pedal pulses). Exam reveals no gallop and no friction rub.  Murmur heard. High-pitched harsh crescendo-decrescendo midsystolic murmur is present with a grade of 2/6 at the upper right sternal border radiating to the neck.  Low-pitched blowing plateau holodiastolic murmur is present with a grade of 3/6 at the lower left sternal border. Pulmonary/Chest: No respiratory distress (Despite accessory muscle use). She has wheezes. She has no rales (Diffuse rhonchi). She exhibits tenderness (Diffuse parasternal tenderness).  Accessory muscle use at rest, but nonlabored.  No rales but diffuse rhonchi  Abdominal: Soft. Bowel sounds are normal. She exhibits no distension. There is tenderness (Diffuse mild tenderness). There is no rebound and no guarding.  Musculoskeletal: She exhibits edema (Trivial).  Wheelchair-bound  Neurological: She is alert and oriented to person, place, and time.  Skin: Skin is warm and dry. She is not diaphoretic.  Psychiatric:  Overall she has sort of flat affect but relatively normal mood.  Nursing note and vitals reviewed.   Adult ECG Report Not checked  Other studies Reviewed: Additional studies/ records that were reviewed today include:  Recent Labs:   Lab Results  Component Value Date   CREATININE 0.90  06/20/2017   BUN 10  06/20/2017   NA 140 06/20/2017   K 3.6 06/20/2017   CL 105 06/20/2017   CO2 29 06/20/2017   Lab Results  Component Value Date   CHOL 310 (H) 07/14/2015   HDL 38.00 (L) 07/14/2015   LDLCALC (H) - 240 05/12/2009   LDLDIRECT 187.0 07/14/2015   TRIG 300.0 (H) 07/14/2015   CHOLHDL 8 07/14/2015  -- Never tolerated Lipitor.   ASSESSMENT / PLAN: Problem List Items Addressed This Visit    Atypical chest pain   Relevant Orders   ECHOCARDIOGRAM COMPLETE   Chest pain with low risk for cardiac etiology (Chronic)    Prior negative Myoview, now with essentially normal coronary CT angiogram. Suspect that her chest pain is probably related to muscle skeletal chest pain given the reproducibility. She does have poorly controlled lipids and hypertension along with long-term smoking. Therefore risk reduction is still crucial.      Cigarette smoker (Chronic)    She apparently is down only 4 cigarettes a day. Still not ready to quit      Relevant Orders   CT CHEST WO CONTRAST   COPD  GOLD 0 with reversibility    Relevant Orders   CT CHEST WO CONTRAST   Essential hypertension, benign (Chronic)    Stable blood pressure current meds      Relevant Medications   rosuvastatin (CRESTOR) 20 MG tablet   Hyperlipidemia with target LDL less than 100 (Chronic)    Plan was to recheck a lipid panel, however the never happened because she was in the hospital. Lipids in 2017 were very poorly controlled. She was intolerant of Lipitor. We'll start Crestor 20 mL daily. Also add co-q10 300 mg daily.  Recheck labs in roughly 3-4 months.      Relevant Medications   rosuvastatin (CRESTOR) 20 MG tablet   Other Relevant Orders   Lipid panel   Comprehensive metabolic panel   Incidental lung nodule    Nodule noted on coronary CTA.  With her history of lung cancer, follow-up is reasonable. We will recheck in 612 months with a noncontrast CT.  I will ask Dr. Alain Marion to follow-up her results.      Relevant  Orders   CT CHEST WO CONTRAST   Severe aortic regurgitation by prior echocardiography - Primary (Chronic)    It is hard to tell how much of her dyspnea is related to AI. Her EF is still preserved, and her LV size is still preserved.  Will follow-up echo in 6 months from original echo. Will need to determine potential repair/replacement options. Will discuss with CT Surgery and are [facility] Cardiology team.      Relevant Medications   rosuvastatin (CRESTOR) 20 MG tablet   Other Relevant Orders   ECHOCARDIOGRAM COMPLETE    Other Visit Diagnoses    Pre-op testing       Relevant Orders   Comprehensive metabolic panel      Current medicines are reviewed at length with the patient today. (+/- concerns) n/a The following changes have been made: n/a  Patient Instructions  MEDICATIONS  --START CoQ 10 ---- take 100 mg everyday for one week than increase by 100 mg each week until you reach 300 mg.  --- start  Crestor ( Rosuvastatin) 20 mg -for the first 2 weeks take three times a week then increase to once a day .      Labs Will be needed in  MAY 2019,will mail you labslip CMP  LIPID    SCHEDULE  AT [place] 2019 Your physician has requested that you have an echocardiogram. Echocardiography is a painless test that uses sound waves to create images of your heart. It provides your doctor with information about the size and shape of your heart and how well your heart's chambers and valves are working. This procedure takes approximately one hour. There are no restrictions for this procedure.   SCHEDULE AT  [facility] - June 2019 CHEST  Non-Cardiac CT scanning, (CAT scanning), is a noninvasive, special x-ray that produces cross-sectional images of the body using x-rays and a computer. CT scans help physicians diagnose and treat medical conditions. For some CT exams, a contrast material is used to enhance visibility in the area of the body being studied.  CT scans provide greater clarity and reveal more details than regular x-ray exams.    Your physician wants you to follow-up in June 2019 [place].You will receive a reminder letter in the mail two months in advance. If you don't receive a letter, please call our office to schedule the follow-up appointment.    Studies Ordered:   Orders Placed This Encounter  Procedures  . CT CHEST WO CONTRAST  . Lipid panel  . Comprehensive metabolic panel  . ECHOCARDIOGRAM COMPLETE      [name], M.D., M.S. Interventional Cardiologist   Pager # [phone] Phone # [phone] [address] [place], [place] [address]

## 2017-07-08 ENCOUNTER — Encounter: Payer: Self-pay | Admitting: Cardiology

## 2017-07-09 ENCOUNTER — Encounter: Payer: Self-pay | Admitting: Cardiology

## 2017-07-09 NOTE — Assessment & Plan Note (Signed)
She apparently is down only 4 cigarettes a day. Still not ready to quit

## 2017-07-09 NOTE — Assessment & Plan Note (Signed)
Plan was to recheck a lipid panel, however the never happened because she was in the hospital. Lipids in 2017 were very poorly controlled. She was intolerant of Lipitor. We'll start Crestor 20 mL daily. Also add co-q10 300 mg daily.  Recheck labs in roughly 3-4 months.

## 2017-07-09 NOTE — Assessment & Plan Note (Signed)
It is hard to tell how much of her dyspnea is related to AI. Her EF is still preserved, and her LV size is still preserved.  Will follow-up echo in 6 months from original echo. Will need to determine potential repair/replacement options. Will discuss with CT Surgery and are [facility] Cardiology team.

## 2017-07-09 NOTE — Assessment & Plan Note (Addendum)
Nodule noted on coronary CTA.  With her history of lung cancer, follow-up is reasonable. We will recheck in 612 months with a noncontrast CT.  I will ask Dr. Alain Marion to follow-up her results.

## 2017-07-09 NOTE — Assessment & Plan Note (Signed)
Stable blood pressure current meds

## 2017-07-09 NOTE — Assessment & Plan Note (Signed)
Prior negative Myoview, now with essentially normal coronary CT angiogram. Suspect that her chest pain is probably related to muscle skeletal chest pain given the reproducibility. She does have poorly controlled lipids and hypertension along with long-term smoking. Therefore risk reduction is still crucial.

## 2017-07-12 ENCOUNTER — Ambulatory Visit: Payer: Self-pay

## 2017-07-12 ENCOUNTER — Encounter: Payer: Self-pay | Admitting: *Deleted

## 2017-07-12 NOTE — Telephone Encounter (Signed)
Pt calling with c/o of burning with and without urination. Pt states pain is a 10/10 when she urinates. Pt states she is having more frequency. Denies fever. States urine is a "brighter yellow". Appt made with [name] at 3 pm 07/13/17. Reason for Disposition . Bad or foul-smelling urine  Answer Assessment - Initial Assessment Questions 1. SYMPTOM: "What's the main symptom you're concerned about?" (e.g., frequency, incontinence)     Frequency,dysuria,urine is a "brighter yellow" 2. ONSET: "When did the  ________  start?"     07/07/16 3. PAIN: "Is there any pain?" If so, ask: "How bad is it?" (Scale: 1-10; mild, moderate, severe)     7-8 when not urinating and 10/10 when urinating 4. CAUSE: "What do you think is causing the symptoms?"     UTI 5. OTHER SYMPTOMS: "Do you have any other symptoms?" (e.g., fever, flank pain, blood in urine, pain with urination)     No fever,no flank pain, no blood in urine + pain with urination 6. PREGNANCY: "Is there any chance you are pregnant?" "When was your last menstrual period?"     n/a  Protocols used: URINARY [facility]

## 2017-07-12 NOTE — Telephone Encounter (Signed)
This encounter was created in error - please disregard.

## 2017-07-13 ENCOUNTER — Encounter: Payer: Self-pay | Admitting: Family

## 2017-07-13 ENCOUNTER — Other Ambulatory Visit: Payer: Medicare HMO

## 2017-07-13 ENCOUNTER — Ambulatory Visit (INDEPENDENT_AMBULATORY_CARE_PROVIDER_SITE_OTHER): Payer: Medicare HMO | Admitting: Family

## 2017-07-13 VITALS — BP 114/70 | HR 98 | Temp 98.1°F | Ht 65.0 in | Wt 172.1 lb

## 2017-07-13 DIAGNOSIS — B372 Candidiasis of skin and nail: Secondary | ICD-10-CM | POA: Diagnosis not present

## 2017-07-13 DIAGNOSIS — R300 Dysuria: Secondary | ICD-10-CM

## 2017-07-13 LAB — POC URINALSYSI DIPSTICK (AUTOMATED)
Bilirubin, UA: NEGATIVE
Blood, UA: 10
Glucose, UA: NEGATIVE
Ketones, UA: NEGATIVE
Nitrite, UA: NEGATIVE
Protein, UA: 15
Spec Grav, UA: 1.025 (ref 1.010–1.025)
Urobilinogen, UA: 0.2 E.U./dL
pH, UA: 5.5 (ref 5.0–8.0)

## 2017-07-13 MED ORDER — KETOCONAZOLE 2 % EX CREA: 1.0000 "application " | TOPICAL_CREAM | Freq: Two times a day (BID) | CUTANEOUS | 0 refills | Status: DC | PRN

## 2017-07-13 MED ORDER — SULFAMETHOXAZOLE-TRIMETHOPRIM 800-160 MG PO TABS: 1.0000 | ORAL_TABLET | Freq: Two times a day (BID) | ORAL | 0 refills | Status: DC
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
## 2017-07-13 NOTE — Addendum Note (Signed)
Addended by: Marcina Millard on: 07/13/2017 03:58 PM   Modules accepted: Orders

## 2017-07-16 ENCOUNTER — Encounter: Payer: Medicare HMO | Admitting: Adult Health

## 2017-07-16 LAB — CULTURE, URINE COMPREHENSIVE
MICRO NUMBER:: 90078433
SPECIMEN QUALITY:: ADEQUATE

## 2017-07-19 DIAGNOSIS — C3490 Malignant neoplasm of unspecified part of unspecified bronchus or lung: Secondary | ICD-10-CM | POA: Diagnosis not present

## 2017-07-23 ENCOUNTER — Ambulatory Visit: Payer: Medicare HMO | Admitting: Cardiology

## 2017-08-01 ENCOUNTER — Encounter: Payer: Self-pay | Admitting: Family

## 2017-08-01 ENCOUNTER — Ambulatory Visit (INDEPENDENT_AMBULATORY_CARE_PROVIDER_SITE_OTHER): Payer: Medicare HMO | Admitting: Family

## 2017-08-01 VITALS — BP 122/58 | HR 85 | Temp 98.1°F | Ht 65.0 in | Wt 170.0 lb

## 2017-08-01 DIAGNOSIS — N390 Urinary tract infection, site not specified: Secondary | ICD-10-CM

## 2017-08-01 LAB — POC URINALSYSI DIPSTICK (AUTOMATED)
Bilirubin, UA: 1
Blood, UA: 2
Glucose, UA: NEGATIVE
Ketones, UA: NEGATIVE
Nitrite, UA: POSITIVE
Protein, UA: 2
Spec Grav, UA: >=1.03 — AB (ref 1.010–1.025)
Urobilinogen, UA: 0.2 E.U./dL
pH, UA: 6 (ref 5.0–8.0)

## 2017-08-01 MED ORDER — CIPROFLOXACIN HCL 500 MG PO TABS: 500.0000 mg | ORAL_TABLET | Freq: Two times a day (BID) | ORAL | 0 refills | Status: DC

## 2017-08-01 NOTE — Progress Notes (Unsigned)
error 
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
## 2017-08-01 NOTE — Addendum Note (Signed)
Addended by: Marcina Millard on: 08/01/2017 09:08 AM   Modules accepted: Orders

## 2017-08-06 ENCOUNTER — Other Ambulatory Visit: Payer: Self-pay | Admitting: Internal Medicine

## 2017-08-13 ENCOUNTER — Encounter: Payer: Self-pay | Admitting: Internal Medicine

## 2017-08-13 ENCOUNTER — Ambulatory Visit (INDEPENDENT_AMBULATORY_CARE_PROVIDER_SITE_OTHER): Payer: Medicare HMO | Admitting: Internal Medicine

## 2017-08-13 DIAGNOSIS — E559 Vitamin D deficiency, unspecified: Secondary | ICD-10-CM | POA: Diagnosis not present

## 2017-08-13 DIAGNOSIS — G40909 Epilepsy, unspecified, not intractable, without status epilepticus: Secondary | ICD-10-CM | POA: Diagnosis not present

## 2017-08-13 DIAGNOSIS — J440 Chronic obstructive pulmonary disease with acute lower respiratory infection: Secondary | ICD-10-CM | POA: Diagnosis not present

## 2017-08-13 DIAGNOSIS — R400 Somnolence: Secondary | ICD-10-CM

## 2017-08-13 DIAGNOSIS — R69 Illness, unspecified: Secondary | ICD-10-CM | POA: Diagnosis not present

## 2017-08-13 DIAGNOSIS — E669 Obesity, unspecified: Secondary | ICD-10-CM | POA: Diagnosis not present

## 2017-08-13 DIAGNOSIS — R300 Dysuria: Secondary | ICD-10-CM

## 2017-08-13 DIAGNOSIS — E039 Hypothyroidism, unspecified: Secondary | ICD-10-CM | POA: Diagnosis not present

## 2017-08-13 DIAGNOSIS — J449 Chronic obstructive pulmonary disease, unspecified: Secondary | ICD-10-CM | POA: Diagnosis not present

## 2017-08-13 DIAGNOSIS — I10 Essential (primary) hypertension: Secondary | ICD-10-CM

## 2017-08-13 DIAGNOSIS — E034 Atrophy of thyroid (acquired): Secondary | ICD-10-CM | POA: Diagnosis not present

## 2017-08-13 DIAGNOSIS — I509 Heart failure, unspecified: Secondary | ICD-10-CM | POA: Diagnosis not present

## 2017-08-13 DIAGNOSIS — E785 Hyperlipidemia, unspecified: Secondary | ICD-10-CM | POA: Diagnosis not present

## 2017-08-13 DIAGNOSIS — I25119 Atherosclerotic heart disease of native coronary artery with unspecified angina pectoris: Secondary | ICD-10-CM | POA: Diagnosis not present

## 2017-08-13 MED ORDER — HYDROCODONE-ACETAMINOPHEN 10-325 MG PO TABS: 1.0000 | ORAL_TABLET | Freq: Four times a day (QID) | ORAL | 0 refills | Status: DC | PRN

## 2017-08-13 NOTE — Assessment & Plan Note (Signed)
NAS diet 

## 2017-08-13 NOTE — Assessment & Plan Note (Signed)
Levothroid 

## 2017-08-13 NOTE — Assessment & Plan Note (Signed)
Better after meds were adjusted

## 2017-08-13 NOTE — Assessment & Plan Note (Signed)
No relapse 

## 2017-08-13 NOTE — Progress Notes (Signed)
Subjective:  Patient ID: Kaylee Hansen, female    DOB: 29-Sep-1955  Age: 62 y.o. MRN: [id number]  CC: No chief complaint on file.   HPI [name] presents for a UTI f/u. There are chronic bladder sx's present x long time. C/o insomnia F/u HTN  Outpatient Medications Prior to Visit  Medication Sig Dispense Refill  . albuterol (PROVENTIL HFA;VENTOLIN HFA) 108 (90 Base) MCG/ACT inhaler Inhale 2 puffs into the lungs every 6 (six) hours as needed for wheezing. 1 Inhaler 5  . albuterol (PROVENTIL) (2.5 MG/3ML) 0.083% nebulizer solution Take 3 mLs (2.5 mg total) by nebulization 4 (four) times daily. 120 vial 11  . ALPRAZolam (XANAX) 0.25 MG tablet Take 1 tablet (0.25 mg total) by mouth at bedtime as needed for anxiety. 90 tablet 1  . aspirin EC 81 MG tablet Take 1 tablet (81 mg total) by mouth daily. 90 tablet 3  . budesonide-formoterol (SYMBICORT) 160-4.5 MCG/ACT inhaler Inhale 2 puffs into the lungs 2 (two) times daily at 10 AM and 5 PM. Rinse mouth after each use 1 Inhaler 0  . Camphor-Menthol-Methyl Sal (HM SALONPAS PAIN RELIEF EX) Apply 1 application topically at bedtime as needed (pain relief).    . ciprofloxacin (CIPRO) 500 MG tablet Take 1 tablet (500 mg total) by mouth 2 (two) times daily. 10 tablet 0  . cycloSPORINE (RESTASIS) 0.05 % ophthalmic emulsion Place 1 drop into both eyes 2 (two) times daily. 0.4 mL 5  . Doxylamine Succinate, Sleep, (SLEEP AID PO) Take 2 tablets by mouth at bedtime.    . famotidine (PEPCID) 20 MG tablet One at bedtime (Patient taking differently: Take 20 mg by mouth at bedtime. One at bedtime) 30 tablet 2  . furosemide (LASIX) 20 MG tablet Take 1 tablet (20 mg total) by mouth daily. 30 tablet 6  . guaiFENesin (MUCINEX) 600 MG 12 hr tablet Take 1 tablet (600 mg total) by mouth 2 (two) times daily as needed for cough or to loosen phlegm. 14 tablet 0  . HYDROcodone-acetaminophen (NORCO) 10-325 MG tablet Take 1 tablet by mouth every 6 (six) hours as needed for  moderate pain or severe pain. Please fill on or after 08/20/17 120 tablet 0  . isosorbide mononitrate (IMDUR) 30 MG 24 hr tablet Take 1 tablet (30 mg total) by mouth daily. 30 tablet 6  . ketoconazole (NIZORAL) 2 % cream Apply 1 application topically 2 (two) times daily as needed for irritation. 60 g 0  . levothyroxine (SYNTHROID, LEVOTHROID) 150 MCG tablet Take 1 tablet (150 mcg total) by mouth daily. 90 tablet 3  . linaclotide (LINZESS) 290 MCG CAPS capsule TAKE ONE CAPSULE BY MOUTH BEFORE BREAKFAST (Patient taking differently: Take 290 mcg by mouth 3 (three) times a week. TAKE ONE CAPSULE BY MOUTH BEFORE BREAKFAST ON MWF.) 30 capsule 2  . nitroGLYCERIN (NITROSTAT) 0.4 MG SL tablet Place 1 tablet (0.4 mg total) under the tongue every 5 (five) minutes as needed for chest pain. 25 tablet 5  . NON FORMULARY 2.5 lpm with sleep only    . pantoprazole (PROTONIX) 40 MG tablet Take 1 tablet (40 mg total) by mouth daily. Take 30-60 min before first meal of the day 90 tablet 3  . PARoxetine (PAXIL) 40 MG tablet Take 1 tablet (40 mg total) by mouth daily. 90 tablet 3  . phenytoin (DILANTIN) 100 MG ER capsule 100 mg every morning and 200 mg every night. 270 capsule 3  . promethazine (PHENERGAN) 25 MG tablet Take 1  tablet (25 mg total) by mouth daily. (Patient taking differently: Take 25 mg by mouth every 6 (six) hours as needed for nausea or vomiting. ) 90 tablet 1  . Respiratory Therapy Supplies (FLUTTER) DEVI As directed 1 each 0  . rosuvastatin (CRESTOR) 20 MG tablet Take 1 tablet (20 mg total) by mouth daily. 90 tablet 3  . sulfamethoxazole-trimethoprim (BACTRIM DS,SEPTRA DS) 800-160 MG tablet Take 1 tablet by mouth 2 (two) times daily. 10 tablet 0  . SUMAtriptan (IMITREX) 100 MG tablet TAKE ONE TABLET BY MOUTH EVERY 2 HOURS AS NEEDED FOR  MIGRAINE  OR  HEADACHE.  MAY  REPEAT  IN  2  HOURS  IF  HEADACHE  PERSISTS  OR  RECUR 9 tablet 2  . traZODone (DESYREL) 100 MG tablet Take 1 tablet (100 mg total) by mouth  at bedtime. 90 tablet 3  . Vitamin D, Ergocalciferol, (DRISDOL) 50000 units CAPS capsule TAKE ONE CAPSULE BY MOUTH ONCE A WEEK 12 capsule 2   No facility-administered medications prior to visit.     ROS Review of Systems  Constitutional: Positive for fatigue. Negative for activity change, appetite change, chills and unexpected weight change.  HENT: Negative for congestion, mouth sores and sinus pressure.   Eyes: Negative for visual disturbance.  Respiratory: Negative for cough and chest tightness.   Gastrointestinal: Negative for abdominal pain and nausea.  Genitourinary: Positive for frequency and urgency. Negative for difficulty urinating and vaginal pain.  Musculoskeletal: Positive for back pain and gait problem.  Skin: Negative for pallor and rash.  Neurological: Positive for weakness. Negative for dizziness, tremors, numbness and headaches.  Psychiatric/Behavioral: Positive for sleep disturbance. Negative for confusion and suicidal ideas. The patient is nervous/anxious.     Objective:  BP 124/68 (BP Location: Left Arm, Patient Position: Sitting, Cuff Size: Normal)   Pulse 86   Temp 98.4 F (36.9 C) (Oral)   Ht 5\' 5"  (1.651 m)   Wt 169 lb (76.7 kg)   SpO2 94%   BMI 28.12 kg/m   BP Readings from Last 3 Encounters:  08/13/17 124/68  08/01/17 (!) 122/58  07/13/17 114/70    Wt Readings from Last 3 Encounters:  08/13/17 169 lb (76.7 kg)  08/01/17 170 lb (77.1 kg)  07/13/17 172 lb 1.9 oz (78.1 kg)    Physical Exam  Constitutional: She appears well-developed. No distress.  HENT:  Head: Normocephalic.  Right Ear: External ear normal.  Left Ear: External ear normal.  Nose: Nose normal.  Mouth/Throat: Oropharynx is clear and moist.  Eyes: Conjunctivae are normal. Pupils are equal, round, and reactive to light. Right eye exhibits no discharge. Left eye exhibits no discharge.  Neck: Normal range of motion. Neck supple. No JVD present. No tracheal deviation present. No  thyromegaly present.  Cardiovascular: Normal rate, regular rhythm and normal heart sounds.  Pulmonary/Chest: No stridor. No respiratory distress. She has no wheezes.  Abdominal: Soft. Bowel sounds are normal. She exhibits no distension and no mass. There is no tenderness. There is no rebound and no guarding.  Musculoskeletal: She exhibits tenderness. She exhibits no edema.  Lymphadenopathy:    She has no cervical adenopathy.  Neurological: She displays normal reflexes. No cranial nerve deficit. She exhibits normal muscle tone. Coordination abnormal.  Skin: No rash noted. No erythema.  Psychiatric: She has a normal mood and affect. Her behavior is normal. Judgment and thought content normal.   In a w/c   Lab Results  Component Value Date   WBC 8.0  06/20/2017   HGB 10.7 (L) 06/20/2017   HCT 32.7 (L) 06/20/2017   PLT 246 06/20/2017   GLUCOSE 110 (H) 06/20/2017   CHOL 320 (H) 06/15/2017   TRIG 164 (H) 06/15/2017   HDL 55 06/15/2017   LDLDIRECT 187.0 07/14/2015   LDLCALC 232 (H) 06/15/2017   ALT 8 06/15/2017   AST 10 06/15/2017   NA 140 06/20/2017   K 3.6 06/20/2017   CL 105 06/20/2017   CREATININE 0.90 06/20/2017   BUN 10 06/20/2017   CO2 29 06/20/2017   TSH 0.46 07/14/2015   INR 1.0 09/16/2008   HGBA1C  09/17/2008    5.0 (NOTE)   The ADA recommends the following therapeutic goal for glycemic   control related to Hgb A1C measurement:   Goal of Therapy:   < 7.0% Hgb A1C   Reference: American Diabetes Association: Clinical Practice   Recommendations 2008, Diabetes Care,  2008, 31:(Suppl 1).    Ct Coronary Morph W/cta Cor W/score W/ca W/cm &/or Wo/cm  Addendum Date: 06/23/2017   ADDENDUM REPORT: 06/23/2017 21:37 CLINICAL DATA:  Chest pain EXAM: Cardiac CTA MEDICATIONS: Sub lingual nitro. 4mg  and lopressor 10mg  IV TECHNIQUE: The patient was scanned on a Siemens 409 slice scanner. Gantry rotation speed was 240 msecs. Collimation was 0.6 mm. A 100 kV prospective scan was triggered  in the ascending thoracic aorta at 35-75% of the R-R interval. Average HR during the scan was 65 bpm. The 3D data set was interpreted on a dedicated work station using MPR, MIP and VRT modes. A total of 80cc of contrast was used. FINDINGS: Non-cardiac: See separate report from [facility]. Calcium Score:  2.9 Agatston units. Coronary Arteries: Right dominant with no anomalies LM:  No plaque or stenosis. LAD system: Small D1, large D2. Relatively small LAD beyond D2. No significant plaque or stenosis noted. Circumflex system:  No plaque or stenosis. RCA system: Soft plaque in the proximal RCA with suspected moderate stenosis. IMPRESSION: 1. Coronary artery calcium score 2.9 Agatston units, placing the patient in the 59th percentile for age and gender. This suggests intermediate risk for future cardiac events. 2. Unusual pattern with minimal calcified plaque but concerning for primarily soft plaque in the proximal RCA with suspected moderate stenosis. Will send for CT FFR to confirm significance of RCA disease. Dalton Mclean Electronically Signed   By: Loralie Champagne M.D.   On: 06/23/2017 21:37   Result Date: 06/23/2017 EXAM: OVER-READ INTERPRETATION  CT CHEST The following report is an over-read performed by radiologist Dr. Alvino Blood [facility] on 06/22/2017. This over-read does not include interpretation of cardiac or coronary anatomy or pathology. The coronary CTA interpretation by the cardiologist is attached. COMPARISON:  None. FINDINGS: Limited view of the lung parenchyma demonstrates an 8 mm nodule in the lingula with indistinct margins (image 29, series 12). Nodule new from comparison CT 2014. Mild atelectasis in the medial LEFT lower lobe. Airways are normal. Limited view of the mediastinum demonstrates no adenopathy. Esophagus normal. Limited view of the upper abdomen unremarkable. Limited view of the skeleton and chest wall is unremarkable. IMPRESSION: Irregular Lingular  nodule. Non-contrast chest CT at 6-12 months is recommended. If the nodule is stable at time of repeat CT, then future CT at 18-24 months (from today's scan) is considered optional for low-risk patients, but is recommended for high-risk patients. This recommendation follows the consensus statement: Guidelines for Management of Incidental Pulmonary Nodules Detected on CT Images: From the Fleischner Society 2017; Radiology 2017; 284:228-243.  These results will be called to the ordering clinician or representative by the Radiologist Assistant, and communication documented in the PACS or zVision Dashboard. Electronically Signed: By: Suzy Bouchard M.D. On: 06/22/2017 16:19   Ct Coronary Fractional Flow Reserve Data Prep  Result Date: 06/25/2017 CLINICAL DATA:  Chest pain EXAM: CT FFR MEDICATIONS: No additional meds. TECHNIQUE: Coronary CT sent for CT FFR. FINDINGS: FFR: 0.9 RCA 0.84 distal LAD 0.87 LCx IMPRESSION: FFR data suggests that coronary disease (specifically RCA lesion) is not hemodynamically significant. Dalton Mclean Electronically Signed   By: Loralie Champagne M.D.   On: 06/25/2017 17:56    Assessment & Plan:   There are no diagnoses linked to this encounter. I am having Nayellie L. Kirkes maintain her NON FORMULARY, promethazine, SUMAtriptan, phenytoin, linaclotide, Camphor-Menthol-Methyl Sal (HM SALONPAS PAIN RELIEF EX), guaiFENesin, famotidine, FLUTTER, isosorbide mononitrate, nitroGLYCERIN, furosemide, aspirin EC, HYDROcodone-acetaminophen, ALPRAZolam, traZODone, budesonide-formoterol, (Doxylamine Succinate, Sleep, (SLEEP AID PO)), cycloSPORINE, albuterol, levothyroxine, PARoxetine, pantoprazole, albuterol, rosuvastatin, ketoconazole, sulfamethoxazole-trimethoprim, ciprofloxacin, and Vitamin D (Ergocalciferol).  No orders of the defined types were placed in this encounter.    Follow-up: No Follow-up on file.  Walker Kehr, MD

## 2017-08-13 NOTE — Assessment & Plan Note (Signed)
In a smoker - refractory

## 2017-08-13 NOTE — Assessment & Plan Note (Signed)
OAB - chronic P. Mirabilis colonization 2019

## 2017-08-19 DIAGNOSIS — C3490 Malignant neoplasm of unspecified part of unspecified bronchus or lung: Secondary | ICD-10-CM | POA: Diagnosis not present

## 2017-08-20 DIAGNOSIS — R69 Illness, unspecified: Secondary | ICD-10-CM | POA: Diagnosis not present

## 2017-08-20 DIAGNOSIS — R3915 Urgency of urination: Secondary | ICD-10-CM | POA: Diagnosis not present

## 2017-08-20 DIAGNOSIS — N3941 Urge incontinence: Secondary | ICD-10-CM | POA: Diagnosis not present

## 2017-08-20 DIAGNOSIS — N3000 Acute cystitis without hematuria: Secondary | ICD-10-CM | POA: Diagnosis not present

## 2017-08-30 ENCOUNTER — Ambulatory Visit: Payer: Medicare HMO | Admitting: Internal Medicine

## 2017-09-16 DIAGNOSIS — C3490 Malignant neoplasm of unspecified part of unspecified bronchus or lung: Secondary | ICD-10-CM | POA: Diagnosis not present

## 2017-10-09 ENCOUNTER — Telehealth: Payer: Self-pay | Admitting: *Deleted

## 2017-10-09 DIAGNOSIS — Z01818 Encounter for other preprocedural examination: Secondary | ICD-10-CM

## 2017-10-09 DIAGNOSIS — E785 Hyperlipidemia, unspecified: Secondary | ICD-10-CM

## 2017-10-09 NOTE — Telephone Encounter (Signed)
Mailed lab slip and letter

## 2017-10-09 NOTE — Telephone Encounter (Signed)
-----   Message from Raiford Simmonds, RN sent at 07/06/2017  9:06 AM EST ----- DUE 11/03/17 - LIPID CMP  MAIL @ 10/08/17  LAB SLIP AND LETTER

## 2017-10-15 ENCOUNTER — Encounter: Payer: Self-pay | Admitting: Internal Medicine

## 2017-10-15 ENCOUNTER — Ambulatory Visit (INDEPENDENT_AMBULATORY_CARE_PROVIDER_SITE_OTHER): Payer: Medicare HMO | Admitting: Internal Medicine

## 2017-10-15 ENCOUNTER — Other Ambulatory Visit (INDEPENDENT_AMBULATORY_CARE_PROVIDER_SITE_OTHER): Payer: Medicare HMO

## 2017-10-15 DIAGNOSIS — M5440 Lumbago with sciatica, unspecified side: Secondary | ICD-10-CM

## 2017-10-15 DIAGNOSIS — F411 Generalized anxiety disorder: Secondary | ICD-10-CM

## 2017-10-15 DIAGNOSIS — R69 Illness, unspecified: Secondary | ICD-10-CM | POA: Diagnosis not present

## 2017-10-15 DIAGNOSIS — G8929 Other chronic pain: Secondary | ICD-10-CM

## 2017-10-15 DIAGNOSIS — F321 Major depressive disorder, single episode, moderate: Secondary | ICD-10-CM | POA: Diagnosis not present

## 2017-10-15 DIAGNOSIS — N3281 Overactive bladder: Secondary | ICD-10-CM | POA: Insufficient documentation

## 2017-10-15 DIAGNOSIS — Z8639 Personal history of other endocrine, nutritional and metabolic disease: Secondary | ICD-10-CM

## 2017-10-15 DIAGNOSIS — R110 Nausea: Secondary | ICD-10-CM

## 2017-10-15 LAB — HEPATIC FUNCTION PANEL
ALT: 22 U/L (ref 0–35)
AST: 18 U/L (ref 0–37)
Albumin: 3.3 g/dL — ABNORMAL LOW (ref 3.5–5.2)
Alkaline Phosphatase: 184 U/L — ABNORMAL HIGH (ref 39–117)
Bilirubin, Direct: 0.2 mg/dL (ref 0.0–0.3)
Total Bilirubin: 0.5 mg/dL (ref 0.2–1.2)
Total Protein: 6.3 g/dL (ref 6.0–8.3)

## 2017-10-15 LAB — BASIC METABOLIC PANEL
BUN: 14 mg/dL (ref 6–23)
CO2: 27 mEq/L (ref 19–32)
Calcium: 8.8 mg/dL (ref 8.4–10.5)
Chloride: 99 mEq/L (ref 96–112)
Creatinine, Ser: 1 mg/dL (ref 0.40–1.20)
GFR: 59.36 mL/min — ABNORMAL LOW (ref >60.00)
Glucose, Bld: 90 mg/dL (ref 70–99)
Potassium: 4.2 mEq/L (ref 3.5–5.1)
Sodium: 136 mEq/L (ref 135–145)

## 2017-10-15 LAB — LIPASE: Lipase: 27 U/L (ref 11.0–59.0)

## 2017-10-15 MED ORDER — ONDANSETRON HCL 4 MG/2ML IJ SOLN
4.0000 mg | Freq: Once | INTRAMUSCULAR | Status: AC
  Administered 2017-10-15: 4 mg via INTRAMUSCULAR

## 2017-10-15 MED ORDER — PROMETHAZINE HCL 25 MG PO TABS: 25.0000 mg | ORAL_TABLET | Freq: Three times a day (TID) | ORAL | 1 refills | Status: DC | PRN

## 2017-10-15 MED ORDER — METHYLPREDNISOLONE ACETATE 80 MG/ML IJ SUSP
80.0000 mg | Freq: Once | INTRAMUSCULAR | Status: AC
  Administered 2017-10-15: 80 mg via INTRAMUSCULAR

## 2017-10-15 MED ORDER — ALPRAZOLAM 0.25 MG PO TABS: 0.2500 mg | ORAL_TABLET | Freq: Every evening | ORAL | 1 refills | Status: DC | PRN

## 2017-10-15 MED ORDER — HYDROCODONE-ACETAMINOPHEN 10-325 MG PO TABS: 1.0000 | ORAL_TABLET | Freq: Four times a day (QID) | ORAL | 0 refills | Status: DC | PRN

## 2017-10-15 MED ORDER — LINACLOTIDE 290 MCG PO CAPS: 290.0000 ug | ORAL_CAPSULE | ORAL | 3 refills | Status: DC

## 2017-10-15 NOTE — Addendum Note (Signed)
Addended by: Karren Cobble on: 10/15/2017 11:47 AM   Modules accepted: Orders

## 2017-10-15 NOTE — Assessment & Plan Note (Signed)
urinary sx's - chronic. She saw a urologist: took Nitrofurantoin that caused confusion and a vaginal cream...  UA, Cx F/u w/urology

## 2017-10-15 NOTE — Progress Notes (Signed)
Subjective:  Patient ID: [name], female    DOB: [birth year]  Age: 62 y.o. MRN: [id number]  CC: No chief complaint on file.   HPI Ashni L Haislip presents for chronic pain, depression, COPD f/u C/o urinary sx's - chronic. She saw a urologist: took Nitrofurantoin that caused confusion and a vaginal cream... C/o nausea now   Outpatient Medications Prior to Visit  Medication Sig Dispense Refill  . albuterol (PROVENTIL HFA;VENTOLIN HFA) 108 (90 Base) MCG/ACT inhaler Inhale 2 puffs into the lungs every 6 (six) hours as needed for wheezing. 1 Inhaler 5  . albuterol (PROVENTIL) (2.5 MG/3ML) 0.083% nebulizer solution Take 3 mLs (2.5 mg total) by nebulization 4 (four) times daily. 120 vial 11  . ALPRAZolam (XANAX) 0.25 MG tablet Take 1 tablet (0.25 mg total) by mouth at bedtime as needed for anxiety. 90 tablet 1  . aspirin EC 81 MG tablet Take 1 tablet (81 mg total) by mouth daily. 90 tablet 3  . budesonide-formoterol (SYMBICORT) 160-4.5 MCG/ACT inhaler Inhale 2 puffs into the lungs 2 (two) times daily at 10 AM and 5 PM. Rinse mouth after each use 1 Inhaler 0  . Camphor-Menthol-Methyl Sal (HM SALONPAS PAIN RELIEF EX) Apply 1 application topically at bedtime as needed (pain relief).    . cycloSPORINE (RESTASIS) 0.05 % ophthalmic emulsion Place 1 drop into both eyes 2 (two) times daily. 0.4 mL 5  . Doxylamine Succinate, Sleep, (SLEEP AID PO) Take 2 tablets by mouth at bedtime.    . famotidine (PEPCID) 20 MG tablet One at bedtime (Patient taking differently: Take 20 mg by mouth at bedtime. One at bedtime) 30 tablet 2  . guaiFENesin (MUCINEX) 600 MG 12 hr tablet Take 1 tablet (600 mg total) by mouth 2 (two) times daily as needed for cough or to loosen phlegm. 14 tablet 0  . HYDROcodone-acetaminophen (NORCO) 10-325 MG tablet Take 1 tablet by mouth every 6 (six) hours as needed for moderate pain or severe pain. Please fill on or after 09/17/17 120 tablet 0  . ketoconazole (NIZORAL) 2 % cream Apply 1  application topically 2 (two) times daily as needed for irritation. 60 g 0  . levothyroxine (SYNTHROID, LEVOTHROID) 150 MCG tablet Take 1 tablet (150 mcg total) by mouth daily. 90 tablet 3  . linaclotide (LINZESS) 290 MCG CAPS capsule TAKE ONE CAPSULE BY MOUTH BEFORE BREAKFAST (Patient taking differently: Take 290 mcg by mouth 3 (three) times a week. TAKE ONE CAPSULE BY MOUTH BEFORE BREAKFAST ON MWF.) 30 capsule 2  . NON FORMULARY 2.5 lpm with sleep only    . pantoprazole (PROTONIX) 40 MG tablet Take 1 tablet (40 mg total) by mouth daily. Take 30-60 min before first meal of the day 90 tablet 3  . PARoxetine (PAXIL) 40 MG tablet Take 1 tablet (40 mg total) by mouth daily. 90 tablet 3  . phenytoin (DILANTIN) 100 MG ER capsule 100 mg every morning and 200 mg every night. 270 capsule 3  . promethazine (PHENERGAN) 25 MG tablet Take 1 tablet (25 mg total) by mouth daily. (Patient taking differently: Take 25 mg by mouth every 6 (six) hours as needed for nausea or vomiting. ) 90 tablet 1  . Respiratory Therapy Supplies (FLUTTER) DEVI As directed 1 each 0  . SUMAtriptan (IMITREX) 100 MG tablet TAKE ONE TABLET BY MOUTH EVERY 2 HOURS AS NEEDED FOR  MIGRAINE  OR  HEADACHE.  MAY  REPEAT  IN  2  HOURS  IF  HEADACHE  PERSISTS  OR  RECUR 9 tablet 2  . traZODone (DESYREL) 100 MG tablet Take 1 tablet (100 mg total) by mouth at bedtime. 90 tablet 3  . Vitamin D, Ergocalciferol, (DRISDOL) 50000 units CAPS capsule TAKE ONE CAPSULE BY MOUTH ONCE A WEEK 12 capsule 2  . furosemide (LASIX) 20 MG tablet Take 1 tablet (20 mg total) by mouth daily. 30 tablet 6  . isosorbide mononitrate (IMDUR) 30 MG 24 hr tablet Take 1 tablet (30 mg total) by mouth daily. 30 tablet 6  . nitroGLYCERIN (NITROSTAT) 0.4 MG SL tablet Place 1 tablet (0.4 mg total) under the tongue every 5 (five) minutes as needed for chest pain. 25 tablet 5  . rosuvastatin (CRESTOR) 20 MG tablet Take 1 tablet (20 mg total) by mouth daily. 90 tablet 3  . ciprofloxacin  (CIPRO) 500 MG tablet Take 1 tablet (500 mg total) by mouth 2 (two) times daily. 10 tablet 0  . sulfamethoxazole-trimethoprim (BACTRIM DS,SEPTRA DS) 800-160 MG tablet Take 1 tablet by mouth 2 (two) times daily. 10 tablet 0   No facility-administered medications prior to visit.     ROS Review of Systems  Constitutional: Negative for activity change, appetite change, chills, fatigue and unexpected weight change.  HENT: Negative for congestion, mouth sores and sinus pressure.   Eyes: Negative for visual disturbance.  Respiratory: Negative for cough and chest tightness.   Gastrointestinal: Negative for abdominal pain and nausea.  Genitourinary: Positive for dysuria, frequency and urgency. Negative for difficulty urinating and vaginal pain.  Musculoskeletal: Positive for back pain. Negative for gait problem.  Skin: Negative for pallor and rash.  Neurological: Negative for dizziness, tremors, weakness, numbness and headaches.  Psychiatric/Behavioral: Positive for sleep disturbance. Negative for confusion and suicidal ideas. The patient is nervous/anxious.     Objective:  BP 114/66 (BP Location: Left Arm, Patient Position: Sitting, Cuff Size: Normal)   Pulse 83   Temp 98.3 F (36.8 C) (Oral)   Ht 5\' 5"  (1.651 m)   Wt 156 lb (70.8 kg)   SpO2 98%   BMI 25.96 kg/m   BP Readings from Last 3 Encounters:  10/15/17 114/66  08/13/17 124/68  08/01/17 (!) 122/58    Wt Readings from Last 3 Encounters:  10/15/17 156 lb (70.8 kg)  08/13/17 169 lb (76.7 kg)  08/01/17 170 lb (77.1 kg)    Physical Exam  Constitutional: She appears well-developed. No distress.  HENT:  Head: Normocephalic.  Right Ear: External ear normal.  Left Ear: External ear normal.  Nose: Nose normal.  Mouth/Throat: Oropharynx is clear and moist.  Eyes: Pupils are equal, round, and reactive to light. Conjunctivae are normal. Right eye exhibits no discharge. Left eye exhibits no discharge.  Neck: Normal range of  motion. Neck supple. No JVD present. No tracheal deviation present. No thyromegaly present.  Cardiovascular: Normal rate, regular rhythm and normal heart sounds.  Pulmonary/Chest: No stridor. No respiratory distress. She has wheezes.  Abdominal: Soft. Bowel sounds are normal. She exhibits no distension and no mass. There is no tenderness. There is no rebound and no guarding.  Musculoskeletal: She exhibits no edema or tenderness.  Lymphadenopathy:    She has no cervical adenopathy.  Neurological: She displays abnormal reflex. No cranial nerve deficit. She exhibits normal muscle tone. Coordination abnormal.  Skin: No rash noted. No erythema.  Psychiatric: She has a normal mood and affect. Her behavior is normal. Judgment and thought content normal.  in a w/c A/o/c Dry heaving LS tender  Lab Results  Component Value Date   WBC 8.0 06/20/2017   HGB 10.7 (L) 06/20/2017   HCT 32.7 (L) 06/20/2017   PLT 246 06/20/2017   GLUCOSE 110 (H) 06/20/2017   CHOL 320 (H) 06/15/2017   TRIG 164 (H) 06/15/2017   HDL 55 06/15/2017   LDLDIRECT 187.0 07/14/2015   LDLCALC 232 (H) 06/15/2017   ALT 8 06/15/2017   AST 10 06/15/2017   NA 140 06/20/2017   K 3.6 06/20/2017   CL 105 06/20/2017   CREATININE 0.90 06/20/2017   BUN 10 06/20/2017   CO2 29 06/20/2017   TSH 0.46 07/14/2015   INR 1.0 09/16/2008   HGBA1C  09/17/2008    5.0 (NOTE)   The ADA recommends the following therapeutic goal for glycemic   control related to Hgb A1C measurement:   Goal of Therapy:   < 7.0% Hgb A1C   Reference: American Diabetes Association: Clinical Practice   Recommendations 2008, Diabetes Care,  2008, 31:(Suppl 1).    Ct Coronary Morph W/cta Cor W/score W/ca W/cm &/or Wo/cm  Addendum Date: 06/23/2017   ADDENDUM REPORT: 06/23/2017 21:37 CLINICAL DATA:  Chest pain EXAM: Cardiac CTA MEDICATIONS: Sub lingual nitro. 4mg  and lopressor 10mg  IV TECHNIQUE: The patient was scanned on a Siemens 989 slice scanner. Gantry rotation  speed was 240 msecs. Collimation was 0.6 mm. A 100 kV prospective scan was triggered in the ascending thoracic aorta at 35-75% of the R-R interval. Average HR during the scan was 65 bpm. The 3D data set was interpreted on a dedicated work station using MPR, MIP and VRT modes. A total of 80cc of contrast was used. FINDINGS: Non-cardiac: See separate report from [facility]. Calcium Score:  2.9 Agatston units. Coronary Arteries: Right dominant with no anomalies LM:  No plaque or stenosis. LAD system: Small D1, large D2. Relatively small LAD beyond D2. No significant plaque or stenosis noted. Circumflex system:  No plaque or stenosis. RCA system: Soft plaque in the proximal RCA with suspected moderate stenosis. IMPRESSION: 1. Coronary artery calcium score 2.9 Agatston units, placing the patient in the 59th percentile for age and gender. This suggests intermediate risk for future cardiac events. 2. Unusual pattern with minimal calcified plaque but concerning for primarily soft plaque in the proximal RCA with suspected moderate stenosis. Will send for CT FFR to confirm significance of RCA disease. Dalton Mclean Electronically Signed   By: Loralie Champagne M.D.   On: 06/23/2017 21:37   Result Date: 06/23/2017 EXAM: OVER-READ INTERPRETATION  CT CHEST The following report is an over-read performed by radiologist Dr. Alvino Blood [facility] on 06/22/2017. This over-read does not include interpretation of cardiac or coronary anatomy or pathology. The coronary CTA interpretation by the cardiologist is attached. COMPARISON:  None. FINDINGS: Limited view of the lung parenchyma demonstrates an 8 mm nodule in the lingula with indistinct margins (image 29, series 12). Nodule new from comparison CT 2014. Mild atelectasis in the medial LEFT lower lobe. Airways are normal. Limited view of the mediastinum demonstrates no adenopathy. Esophagus normal. Limited view of the upper abdomen unremarkable. Limited  view of the skeleton and chest wall is unremarkable. IMPRESSION: Irregular Lingular nodule. Non-contrast chest CT at 6-12 months is recommended. If the nodule is stable at time of repeat CT, then future CT at 18-24 months (from today's scan) is considered optional for low-risk patients, but is recommended for high-risk patients. This recommendation follows the consensus statement: Guidelines for Management of Incidental Pulmonary Nodules Detected on CT Images: From  the Fleischner Society 2017; Radiology 2017; [phone]. These results will be called to the ordering clinician or representative by the Radiologist Assistant, and communication documented in the PACS or zVision Dashboard. Electronically Signed: By: Suzy Bouchard M.D. On: 06/22/2017 16:19   Ct Coronary Fractional Flow Reserve Data Prep  Result Date: 06/25/2017 CLINICAL DATA:  Chest pain EXAM: CT FFR MEDICATIONS: No additional meds. TECHNIQUE: Coronary CT sent for CT FFR. FINDINGS: FFR: 0.9 RCA 0.84 distal LAD 0.87 LCx IMPRESSION: FFR data suggests that coronary disease (specifically RCA lesion) is not hemodynamically significant. Dalton Mclean Electronically Signed   By: Loralie Champagne M.D.   On: 06/25/2017 17:56    Assessment & Plan:   There are no diagnoses linked to this encounter. I have discontinued Shermika L. Arrey's sulfamethoxazole-trimethoprim and ciprofloxacin. I am also having her maintain her NON FORMULARY, promethazine, SUMAtriptan, phenytoin, linaclotide, Camphor-Menthol-Methyl Sal (HM SALONPAS PAIN RELIEF EX), guaiFENesin, famotidine, FLUTTER, isosorbide mononitrate, nitroGLYCERIN, furosemide, aspirin EC, ALPRAZolam, traZODone, budesonide-formoterol, (Doxylamine Succinate, Sleep, (SLEEP AID PO)), cycloSPORINE, albuterol, levothyroxine, PARoxetine, pantoprazole, albuterol, rosuvastatin, ketoconazole, Vitamin D (Ergocalciferol), and HYDROcodone-acetaminophen.  No orders of the defined types were placed in this  encounter.    Follow-up: No follow-ups on file.  Walker Kehr, MD

## 2017-10-15 NOTE — Assessment & Plan Note (Signed)
Norco prn  Potential benefits of a long term opioids use as well as potential risks (i.e. addiction risk, apnea etc) and complications (i.e. Somnolence, constipation and others) were explained to the patient and were aknowledged. 

## 2017-10-15 NOTE — Assessment & Plan Note (Signed)
Trazodone at hs Xanax prn  Potential benefits of a long term benzodiazepines  use as well as potential risks  and complications were explained to the patient and were aknowledged.

## 2017-10-15 NOTE — Assessment & Plan Note (Signed)
Trazodone at hs Paxil

## 2017-10-15 NOTE — Assessment & Plan Note (Signed)
Zofran IM To ER isf worse

## 2017-10-15 NOTE — Assessment & Plan Note (Signed)
Depomedrol IM Zofran

## 2017-10-17 DIAGNOSIS — C3490 Malignant neoplasm of unspecified part of unspecified bronchus or lung: Secondary | ICD-10-CM | POA: Diagnosis not present

## 2017-10-30 DIAGNOSIS — Z01818 Encounter for other preprocedural examination: Secondary | ICD-10-CM | POA: Diagnosis not present

## 2017-10-30 DIAGNOSIS — E785 Hyperlipidemia, unspecified: Secondary | ICD-10-CM | POA: Diagnosis not present

## 2017-10-30 LAB — LIPID PANEL
Chol/HDL Ratio: 4.6 ratio — ABNORMAL HIGH (ref 0.0–4.4)
Cholesterol, Total: 223 mg/dL — ABNORMAL HIGH (ref 100–199)
HDL: 49 mg/dL (ref >39)
LDL Calculated: 136 mg/dL — ABNORMAL HIGH (ref 0–99)
Triglycerides: 192 mg/dL — ABNORMAL HIGH (ref 0–149)
VLDL Cholesterol Cal: 38 mg/dL (ref 5–40)

## 2017-10-30 LAB — COMPREHENSIVE METABOLIC PANEL
ALT: 7 IU/L (ref 0–32)
AST: 11 IU/L (ref 0–40)
Albumin/Globulin Ratio: 1.6 (ref 1.2–2.2)
Albumin: 3.9 g/dL (ref 3.6–4.8)
Alkaline Phosphatase: 162 IU/L — ABNORMAL HIGH (ref 39–117)
BUN/Creatinine Ratio: 9 — ABNORMAL LOW (ref 12–28)
BUN: 8 mg/dL (ref 8–27)
Bilirubin Total: <0.2 mg/dL (ref 0.0–1.2)
CO2: 25 mmol/L (ref 20–29)
Calcium: 8.9 mg/dL (ref 8.7–10.3)
Chloride: 98 mmol/L (ref 96–106)
Creatinine, Ser: 0.9 mg/dL (ref 0.57–1.00)
GFR calc Af Amer: 79 mL/min/{1.73_m2} (ref >59)
GFR calc non Af Amer: 68 mL/min/{1.73_m2} (ref >59)
Globulin, Total: 2.5 g/dL (ref 1.5–4.5)
Glucose: 88 mg/dL (ref 65–99)
Potassium: 4.8 mmol/L (ref 3.5–5.2)
Sodium: 138 mmol/L (ref 134–144)
Total Protein: 6.4 g/dL (ref 6.0–8.5)

## 2017-11-02 ENCOUNTER — Other Ambulatory Visit: Payer: Self-pay

## 2017-11-02 ENCOUNTER — Ambulatory Visit (HOSPITAL_COMMUNITY): Payer: Medicare HMO | Attending: Cardiology

## 2017-11-02 DIAGNOSIS — E785 Hyperlipidemia, unspecified: Secondary | ICD-10-CM | POA: Diagnosis not present

## 2017-11-02 DIAGNOSIS — I351 Nonrheumatic aortic (valve) insufficiency: Secondary | ICD-10-CM | POA: Diagnosis not present

## 2017-11-02 DIAGNOSIS — Z8673 Personal history of transient ischemic attack (TIA), and cerebral infarction without residual deficits: Secondary | ICD-10-CM | POA: Insufficient documentation

## 2017-11-02 DIAGNOSIS — R0789 Other chest pain: Secondary | ICD-10-CM | POA: Diagnosis not present

## 2017-11-02 DIAGNOSIS — Z720 Tobacco use: Secondary | ICD-10-CM | POA: Diagnosis not present

## 2017-11-02 DIAGNOSIS — J449 Chronic obstructive pulmonary disease, unspecified: Secondary | ICD-10-CM | POA: Insufficient documentation

## 2017-11-16 DIAGNOSIS — C3490 Malignant neoplasm of unspecified part of unspecified bronchus or lung: Secondary | ICD-10-CM | POA: Diagnosis not present

## 2017-11-21 ENCOUNTER — Telehealth: Payer: Self-pay | Admitting: *Deleted

## 2017-11-21 DIAGNOSIS — E785 Hyperlipidemia, unspecified: Secondary | ICD-10-CM

## 2017-11-21 NOTE — Telephone Encounter (Signed)
Spoke to patient. Result given . Verbalized understanding  ORDER LABS 3-4 MONTHS  HEPATIC , LIPIDS

## 2017-11-21 NOTE — Telephone Encounter (Signed)
-----   Message from Leonie Man, MD sent at 11/17/2017 12:59 PM EDT ----- Essentially normal chemistry panel with normal kidney and liver function. Cholesterol panel shows notable improvement please gosince starting statin.,  Total cholesterol is down from 320 down to 223, LDL down from 232 to 136.  -- For now, continue current dose of Crestor if tolerated and recheck in 3-4 months.  [name], MD

## 2017-11-21 NOTE — Telephone Encounter (Signed)
LEFT MESSAGE TO CALL BACK WITH HUSBAND- LAB RESULTS

## 2017-11-22 DIAGNOSIS — R3915 Urgency of urination: Secondary | ICD-10-CM | POA: Diagnosis not present

## 2017-11-22 DIAGNOSIS — N3941 Urge incontinence: Secondary | ICD-10-CM | POA: Diagnosis not present

## 2017-11-22 DIAGNOSIS — N3000 Acute cystitis without hematuria: Secondary | ICD-10-CM | POA: Diagnosis not present

## 2017-11-24 DIAGNOSIS — I214 Non-ST elevation (NSTEMI) myocardial infarction: Secondary | ICD-10-CM

## 2017-11-24 DIAGNOSIS — Z9861 Coronary angioplasty status: Secondary | ICD-10-CM

## 2017-11-24 DIAGNOSIS — I2510 Atherosclerotic heart disease of native coronary artery without angina pectoris: Secondary | ICD-10-CM

## 2017-11-24 HISTORY — DX: Atherosclerotic heart disease of native coronary artery without angina pectoris: I25.10

## 2017-11-24 HISTORY — DX: Coronary angioplasty status: Z98.61

## 2017-11-24 HISTORY — DX: Non-ST elevation (NSTEMI) myocardial infarction: I21.4

## 2017-11-29 ENCOUNTER — Encounter: Payer: Self-pay | Admitting: Internal Medicine

## 2017-11-29 ENCOUNTER — Telehealth: Payer: Self-pay | Admitting: Internal Medicine

## 2017-11-29 DIAGNOSIS — N6321 Unspecified lump in the left breast, upper outer quadrant: Secondary | ICD-10-CM | POA: Diagnosis not present

## 2017-11-29 DIAGNOSIS — R928 Other abnormal and inconclusive findings on diagnostic imaging of breast: Secondary | ICD-10-CM | POA: Diagnosis not present

## 2017-11-29 LAB — HM MAMMOGRAPHY

## 2017-11-29 NOTE — Telephone Encounter (Signed)
Copied from [place] [phone]. Topic: Quick Communication - See Telephone Encounter >> Nov 29, 2017 11:38 AM Aurelio Brash B wrote: CRM for notification. See Telephone encounter for: 11/29/17. Ebony Hail from [facility] an order for  diagnostic mammogram, breast U/S ,  pt is there now with lump in breast     [phone]-  ext [phone]

## 2017-11-29 NOTE — Telephone Encounter (Signed)
Order faxed.

## 2017-12-06 DIAGNOSIS — N3000 Acute cystitis without hematuria: Secondary | ICD-10-CM | POA: Diagnosis not present

## 2017-12-13 ENCOUNTER — Encounter: Payer: Self-pay | Admitting: Internal Medicine

## 2017-12-13 ENCOUNTER — Ambulatory Visit (INDEPENDENT_AMBULATORY_CARE_PROVIDER_SITE_OTHER): Payer: Medicare HMO | Admitting: Internal Medicine

## 2017-12-13 DIAGNOSIS — F321 Major depressive disorder, single episode, moderate: Secondary | ICD-10-CM

## 2017-12-13 DIAGNOSIS — R69 Illness, unspecified: Secondary | ICD-10-CM | POA: Diagnosis not present

## 2017-12-13 DIAGNOSIS — K219 Gastro-esophageal reflux disease without esophagitis: Secondary | ICD-10-CM | POA: Diagnosis not present

## 2017-12-13 DIAGNOSIS — F17210 Nicotine dependence, cigarettes, uncomplicated: Secondary | ICD-10-CM | POA: Diagnosis not present

## 2017-12-13 DIAGNOSIS — E559 Vitamin D deficiency, unspecified: Secondary | ICD-10-CM | POA: Diagnosis not present

## 2017-12-13 DIAGNOSIS — R296 Repeated falls: Secondary | ICD-10-CM

## 2017-12-13 DIAGNOSIS — R300 Dysuria: Secondary | ICD-10-CM | POA: Diagnosis not present

## 2017-12-13 DIAGNOSIS — F411 Generalized anxiety disorder: Secondary | ICD-10-CM | POA: Diagnosis not present

## 2017-12-13 DIAGNOSIS — I10 Essential (primary) hypertension: Secondary | ICD-10-CM | POA: Diagnosis not present

## 2017-12-13 MED ORDER — HYDROCODONE-ACETAMINOPHEN 10-325 MG PO TABS: 1.0000 | ORAL_TABLET | Freq: Four times a day (QID) | ORAL | 0 refills | Status: DC | PRN

## 2017-12-13 NOTE — Assessment & Plan Note (Signed)
Chronic, refractory

## 2017-12-13 NOTE — Progress Notes (Signed)
Subjective:  Patient ID: [name], female    DOB: 1956/06/19  Age: 62 y.o. MRN: [id number]  CC: No chief complaint on file.   HPI Rhylan L Delon presents for LBP, UTI, arthralgias f/u  Outpatient Medications Prior to Visit  Medication Sig Dispense Refill  . albuterol (PROVENTIL HFA;VENTOLIN HFA) 108 (90 Base) MCG/ACT inhaler Inhale 2 puffs into the lungs every 6 (six) hours as needed for wheezing. 1 Inhaler 5  . albuterol (PROVENTIL) (2.5 MG/3ML) 0.083% nebulizer solution Take 3 mLs (2.5 mg total) by nebulization 4 (four) times daily. 120 vial 11  . ALPRAZolam (XANAX) 0.25 MG tablet Take 1 tablet (0.25 mg total) by mouth at bedtime as needed for anxiety. 90 tablet 1  . aspirin EC 81 MG tablet Take 1 tablet (81 mg total) by mouth daily. 90 tablet 3  . budesonide-formoterol (SYMBICORT) 160-4.5 MCG/ACT inhaler Inhale 2 puffs into the lungs 2 (two) times daily at 10 AM and 5 PM. Rinse mouth after each use 1 Inhaler 0  . Camphor-Menthol-Methyl Sal (HM SALONPAS PAIN RELIEF EX) Apply 1 application topically at bedtime as needed (pain relief).    . cephALEXin (KEFLEX) 250 MG capsule     . cephALEXin (KEFLEX) 500 MG capsule     . cycloSPORINE (RESTASIS) 0.05 % ophthalmic emulsion Place 1 drop into both eyes 2 (two) times daily. 0.4 mL 5  . Doxylamine Succinate, Sleep, (SLEEP AID PO) Take 2 tablets by mouth at bedtime.    . famotidine (PEPCID) 20 MG tablet One at bedtime (Patient taking differently: Take 20 mg by mouth at bedtime. One at bedtime) 30 tablet 2  . guaiFENesin (MUCINEX) 600 MG 12 hr tablet Take 1 tablet (600 mg total) by mouth 2 (two) times daily as needed for cough or to loosen phlegm. 14 tablet 0  . HYDROcodone-acetaminophen (NORCO) 10-325 MG tablet Take 1 tablet by mouth every 6 (six) hours as needed for moderate pain or severe pain. Please fill on or after 11/17/17 120 tablet 0  . ketoconazole (NIZORAL) 2 % cream Apply 1 application topically 2 (two) times daily as needed for  irritation. 60 g 0  . levothyroxine (SYNTHROID, LEVOTHROID) 150 MCG tablet Take 1 tablet (150 mcg total) by mouth daily. 90 tablet 3  . linaclotide (LINZESS) 290 MCG CAPS capsule Take 1 capsule (290 mcg total) by mouth 3 (three) times a week. TAKE ONE CAPSULE BY MOUTH BEFORE BREAKFAST ON MWF. 90 capsule 3  . NON FORMULARY 2.5 lpm with sleep only    . pantoprazole (PROTONIX) 40 MG tablet Take 1 tablet (40 mg total) by mouth daily. Take 30-60 min before first meal of the day 90 tablet 3  . PARoxetine (PAXIL) 40 MG tablet Take 1 tablet (40 mg total) by mouth daily. 90 tablet 3  . phenytoin (DILANTIN) 100 MG ER capsule 100 mg every morning and 200 mg every night. 270 capsule 3  . promethazine (PHENERGAN) 25 MG tablet Take 1 tablet (25 mg total) by mouth every 8 (eight) hours as needed for nausea or vomiting. 90 tablet 1  . Respiratory Therapy Supplies (FLUTTER) DEVI As directed 1 each 0  . SUMAtriptan (IMITREX) 100 MG tablet TAKE ONE TABLET BY MOUTH EVERY 2 HOURS AS NEEDED FOR  MIGRAINE  OR  HEADACHE.  MAY  REPEAT  IN  2  HOURS  IF  HEADACHE  PERSISTS  OR  RECUR 9 tablet 2  . traZODone (DESYREL) 100 MG tablet Take 1 tablet (100 mg total)  by mouth at bedtime. 90 tablet 3  . Vitamin D, Ergocalciferol, (DRISDOL) 50000 units CAPS capsule TAKE ONE CAPSULE BY MOUTH ONCE A WEEK 12 capsule 2  . furosemide (LASIX) 20 MG tablet Take 1 tablet (20 mg total) by mouth daily. 30 tablet 6  . isosorbide mononitrate (IMDUR) 30 MG 24 hr tablet Take 1 tablet (30 mg total) by mouth daily. 30 tablet 6  . nitroGLYCERIN (NITROSTAT) 0.4 MG SL tablet Place 1 tablet (0.4 mg total) under the tongue every 5 (five) minutes as needed for chest pain. 25 tablet 5  . rosuvastatin (CRESTOR) 20 MG tablet Take 1 tablet (20 mg total) by mouth daily. 90 tablet 3   No facility-administered medications prior to visit.     ROS: Review of Systems  Constitutional: Positive for fatigue. Negative for activity change, appetite change, chills  and unexpected weight change.  HENT: Negative for congestion, mouth sores and sinus pressure.   Eyes: Negative for visual disturbance.  Respiratory: Negative for cough and chest tightness.   Gastrointestinal: Negative for abdominal pain and nausea.  Genitourinary: Positive for flank pain, frequency and urgency. Negative for difficulty urinating and vaginal pain.  Musculoskeletal: Positive for arthralgias and back pain. Negative for gait problem.  Skin: Negative for pallor and rash.  Neurological: Positive for weakness and headaches. Negative for dizziness, tremors and numbness.  Psychiatric/Behavioral: Positive for decreased concentration, dysphoric mood and sleep disturbance. Negative for confusion and suicidal ideas. The patient is nervous/anxious.     Objective:  BP (!) 124/52 (BP Location: Left Arm, Patient Position: Sitting, Cuff Size: Normal)   Pulse 79   Temp 98.4 F (36.9 C) (Oral)   Ht 5\' 5"  (1.651 m)   Wt 151 lb (68.5 kg)   SpO2 97%   BMI 25.13 kg/m   BP Readings from Last 3 Encounters:  12/13/17 (!) 124/52  10/15/17 114/66  08/13/17 124/68    Wt Readings from Last 3 Encounters:  12/13/17 151 lb (68.5 kg)  10/15/17 156 lb (70.8 kg)  08/13/17 169 lb (76.7 kg)    Physical Exam  Constitutional: She appears well-developed. No distress.  HENT:  Head: Normocephalic.  Right Ear: External ear normal.  Left Ear: External ear normal.  Nose: Nose normal.  Mouth/Throat: Oropharynx is clear and moist.  Eyes: Pupils are equal, round, and reactive to light. Conjunctivae are normal. Right eye exhibits no discharge. Left eye exhibits no discharge.  Neck: Normal range of motion. Neck supple. No JVD present. No tracheal deviation present. No thyromegaly present.  Cardiovascular: Normal rate, regular rhythm and normal heart sounds.  Pulmonary/Chest: No stridor. No respiratory distress. She has wheezes.  Abdominal: Soft. Bowel sounds are normal. She exhibits no distension and no  mass. There is no tenderness. There is no rebound and no guarding.  Musculoskeletal: She exhibits tenderness. She exhibits no edema.  Lymphadenopathy:    She has no cervical adenopathy.  Neurological: She displays normal reflexes. No cranial nerve deficit. She exhibits normal muscle tone. Coordination abnormal.  Skin: No rash noted. No erythema.  Psychiatric: Her behavior is normal. Judgment and thought content normal.  LS tender In a w/c  Lab Results  Component Value Date   WBC 8.0 06/20/2017   HGB 10.7 (L) 06/20/2017   HCT 32.7 (L) 06/20/2017   PLT 246 06/20/2017   GLUCOSE 88 10/30/2017   CHOL 223 (H) 10/30/2017   TRIG 192 (H) 10/30/2017   HDL 49 10/30/2017   LDLDIRECT 187.0 07/14/2015   LDLCALC 136 (H)  10/30/2017   ALT 7 10/30/2017   AST 11 10/30/2017   NA 138 10/30/2017   K 4.8 10/30/2017   CL 98 10/30/2017   CREATININE 0.90 10/30/2017   BUN 8 10/30/2017   CO2 25 10/30/2017   TSH 0.46 07/14/2015   INR 1.0 09/16/2008   HGBA1C  09/17/2008    5.0 (NOTE)   The ADA recommends the following therapeutic goal for glycemic   control related to Hgb A1C measurement:   Goal of Therapy:   < 7.0% Hgb A1C   Reference: American Diabetes Association: Clinical Practice   Recommendations 2008, Diabetes Care,  2008, 31:(Suppl 1).    Ct Coronary Morph W/cta Cor W/score W/ca W/cm &/or Wo/cm  Addendum Date: 06/23/2017   ADDENDUM REPORT: 06/23/2017 21:37 CLINICAL DATA:  Chest pain EXAM: Cardiac CTA MEDICATIONS: Sub lingual nitro. 4mg  and lopressor 10mg  IV TECHNIQUE: The patient was scanned on a Siemens 767 slice scanner. Gantry rotation speed was 240 msecs. Collimation was 0.6 mm. A 100 kV prospective scan was triggered in the ascending thoracic aorta at 35-75% of the R-R interval. Average HR during the scan was 65 bpm. The 3D data set was interpreted on a dedicated work station using MPR, MIP and VRT modes. A total of 80cc of contrast was used. FINDINGS: Non-cardiac: See separate report from  [facility]. Calcium Score:  2.9 Agatston units. Coronary Arteries: Right dominant with no anomalies LM:  No plaque or stenosis. LAD system: Small D1, large D2. Relatively small LAD beyond D2. No significant plaque or stenosis noted. Circumflex system:  No plaque or stenosis. RCA system: Soft plaque in the proximal RCA with suspected moderate stenosis. IMPRESSION: 1. Coronary artery calcium score 2.9 Agatston units, placing the patient in the 59th percentile for age and gender. This suggests intermediate risk for future cardiac events. 2. Unusual pattern with minimal calcified plaque but concerning for primarily soft plaque in the proximal RCA with suspected moderate stenosis. Will send for CT FFR to confirm significance of RCA disease. Dalton Mclean Electronically Signed   By: Loralie Champagne M.D.   On: 06/23/2017 21:37   Result Date: 06/23/2017 EXAM: OVER-READ INTERPRETATION  CT CHEST The following report is an over-read performed by radiologist Dr. Alvino Blood [facility] on 06/22/2017. This over-read does not include interpretation of cardiac or coronary anatomy or pathology. The coronary CTA interpretation by the cardiologist is attached. COMPARISON:  None. FINDINGS: Limited view of the lung parenchyma demonstrates an 8 mm nodule in the lingula with indistinct margins (image 29, series 12). Nodule new from comparison CT 2014. Mild atelectasis in the medial LEFT lower lobe. Airways are normal. Limited view of the mediastinum demonstrates no adenopathy. Esophagus normal. Limited view of the upper abdomen unremarkable. Limited view of the skeleton and chest wall is unremarkable. IMPRESSION: Irregular Lingular nodule. Non-contrast chest CT at 6-12 months is recommended. If the nodule is stable at time of repeat CT, then future CT at 18-24 months (from today's scan) is considered optional for low-risk patients, but is recommended for high-risk patients. This recommendation follows the  consensus statement: Guidelines for Management of Incidental Pulmonary Nodules Detected on CT Images: From the Fleischner Society 2017; Radiology 2017; 284:228-243. These results will be called to the ordering clinician or representative by the Radiologist Assistant, and communication documented in the PACS or zVision Dashboard. Electronically Signed: By: Suzy Bouchard M.D. On: 06/22/2017 16:19   Ct Coronary Fractional Flow Reserve Data Prep  Result Date: 06/25/2017 CLINICAL DATA:  Chest  pain EXAM: CT FFR MEDICATIONS: No additional meds. TECHNIQUE: Coronary CT sent for CT FFR. FINDINGS: FFR: 0.9 RCA 0.84 distal LAD 0.87 LCx IMPRESSION: FFR data suggests that coronary disease (specifically RCA lesion) is not hemodynamically significant. Dalton Mclean Electronically Signed   By: Loralie Champagne M.D.   On: 06/25/2017 17:56    Assessment & Plan:   There are no diagnoses linked to this encounter.   No orders of the defined types were placed in this encounter.    Follow-up: No follow-ups on file.  Walker Kehr, MD

## 2017-12-13 NOTE — Assessment & Plan Note (Signed)
Xanax prn  Potential benefits of a long term benzodiazepines  use as well as potential risks  and complications were explained to the patient and were aknowledged. 

## 2017-12-13 NOTE — Assessment & Plan Note (Signed)
Vit D 

## 2017-12-13 NOTE — Assessment & Plan Note (Signed)
Seeing a urologist - [facility]

## 2017-12-13 NOTE — Assessment & Plan Note (Signed)
Paxil Trazodone

## 2017-12-13 NOTE — Assessment & Plan Note (Signed)
Furosemide per cardiology

## 2017-12-13 NOTE — Assessment & Plan Note (Signed)
Protonix.  ?

## 2017-12-13 NOTE — Assessment & Plan Note (Signed)
In a w/c 

## 2017-12-17 DIAGNOSIS — C3490 Malignant neoplasm of unspecified part of unspecified bronchus or lung: Secondary | ICD-10-CM | POA: Diagnosis not present

## 2017-12-20 ENCOUNTER — Encounter: Payer: Self-pay | Admitting: Internal Medicine

## 2017-12-24 ENCOUNTER — Ambulatory Visit: Payer: Medicare HMO | Admitting: Cardiology

## 2017-12-24 ENCOUNTER — Encounter: Payer: Self-pay | Admitting: Cardiology

## 2017-12-24 VITALS — BP 121/52 | HR 80 | Ht 65.0 in | Wt 151.2 lb

## 2017-12-24 DIAGNOSIS — R69 Illness, unspecified: Secondary | ICD-10-CM | POA: Diagnosis not present

## 2017-12-24 DIAGNOSIS — R079 Chest pain, unspecified: Secondary | ICD-10-CM | POA: Diagnosis not present

## 2017-12-24 DIAGNOSIS — I351 Nonrheumatic aortic (valve) insufficiency: Secondary | ICD-10-CM

## 2017-12-24 DIAGNOSIS — F17210 Nicotine dependence, cigarettes, uncomplicated: Secondary | ICD-10-CM

## 2017-12-24 DIAGNOSIS — E785 Hyperlipidemia, unspecified: Secondary | ICD-10-CM

## 2017-12-24 DIAGNOSIS — I10 Essential (primary) hypertension: Secondary | ICD-10-CM

## 2017-12-24 NOTE — Patient Instructions (Addendum)
NO MEDICATION CHANGES    WILL MAIL LABSLIP TO YOU AUG  2019  LIPID , CMP     CONGRATULATION ON FOLLOWING THROUGH WITH QUITTING SMOKING.   SCHEDULE AT [facility] has requested that you have an echocardiogram. Echocardiography is a painless test that uses sound waves to create images of your heart. It provides your doctor with information about the size and shape of your heart and how well your heart's chambers and valves are working. This procedure takes approximately one hour. There are no restrictions for this procedure.   Your physician wants you to follow-up in [place] . You will receive a reminder letter in the mail two months in advance. If you don't receive a letter, please call our office to schedule the follow-up appointment.  If you need a refill on your cardiac medications before your next appointment, please call your pharmacy.

## 2017-12-24 NOTE — Progress Notes (Signed)
PCP: Plotnikov, [name], MD  Clinic Note: Chief Complaint  Patient presents with  . Follow-up    Post echo  . Cardiac Valve Problem    Aortic insufficiency    HPI: Kaylee Hansen is a 62 y.o. female -long-term smoker who has significant COPD,.  She is being seen today for the follow-up of evaluation of tightness in the chest at the request of Plotnikov, [name], MD.   Kaylee Hansen has severe COPD from long-term smoking -on home oxygen (especially at night). She is also essentially wheelchair bound because of significant arthritic pain in her hips and knees.  She is relatively sedentary. She is profoundly dyspneic with just about any activity but has been noticing several episodes of chest discomfort off and on throughout the course of the day.  She was initially seen on November 9, she complained of chest discomfort and dyspnea.  It was difficult to tell what her symptoms all were.  She definitely had an aortic regurgitation murmur so I ordered an echocardiogram followed by coronary CT angiogram.   Recent Hospitalizations:   None  Studies Personally Reviewed - (if available, images/films reviewed: From Epic Chart or Care Everywhere)  Transthoracic Echo Nov 02, 2017: Normal EF 60 to 65%.  GR 1 DD.  No regional wall motion abnormality (RWMA).  Mildly calcified aortic valve with mild stenosis and moderate-severe AI (holodiastolic flow reversal in the descending thoracic aorta not seen -visually appeared to be severe, but by criteria considered moderate).  Mild to moderate MR.  Interval History: Kaylee Hansen returns today really pretty much stable.  She has not had any recent hospitalizations, but remains pretty much wheelchair-bound and has very poor balance if she does any walking around the house.  She still has off-and-on episodes of some chest discomfort or palpitations but nothing routine.  When she stands up she is definite orthostatic symptoms mostly because she is so sedentary.  If she  does any activity she does get dyspneic, that has not changed.  Difficult to tell if she has PND orthopnea because she wears oxygen at night.  She may sit up to catch her breath, but not necessarily true PND.  She mostly notes chest pain associated with coughing or with deep inspiration and not necessarily with exertional. She does have some vertigo and dizziness but also has orthostatic dizziness because of her sedentary lifestyle. She does not walk enough to assess claudication. No TIA or amaurosis fugax.  No syncope/near syncope.   ROS: A comprehensive was performed. Review of Systems  Constitutional: Positive for malaise/fatigue (Chronic.  Essentially wheelchair-bound.  No energy). Negative for chills, fever and weight loss.  HENT: Positive for congestion and sore throat (Hoarse voice). Negative for hearing loss.   Respiratory: Positive for cough (Chronic, but now more frequent), sputum production (Not purulent), shortness of breath (At baseline) and wheezing (Off-and-on).        Chronic, consistent COPD symptoms without any exacerbation  Cardiovascular: Positive for chest pain (Worse with coughing).  Gastrointestinal: Positive for abdominal pain, constipation, diarrhea and heartburn. Negative for blood in stool and melena.       She has some type of chronic colitis with intermittent abdominal pain and constipation.  Now she is having diarrhea.  Genitourinary: Negative for dysuria, frequency and hematuria.  Musculoskeletal: Positive for back pain, falls (She does not stand up without assistance) and joint pain. Negative for myalgias.       Chronic profound hip and knee pain presumably from arthritis.  Does not have strength to stand on her own.  Neurological: Positive for dizziness (Vertigo) and headaches (With congestion). Negative for focal weakness (Legs mostly upper legs feel very weak, partly because of her arthritic pains.), loss of consciousness and weakness (Lower extremities of the  week as part.).  Psychiatric/Behavioral: Positive for depression. Negative for memory loss. The patient is nervous/anxious and has insomnia (Has a hard time falling asleep).   All other systems reviewed and are negative.  --She did finally quit smoking.  She is currently using nicotine patch.  I have reviewed and (if needed) personally updated the patient's problem list, medications, allergies, past medical and surgical history, social and family history.   Past Medical History:  Diagnosis Date  . Anxiety   . Aortic insufficiency with aortic stenosis 04/2017   Echo with severe aortic insufficiency with mild aortic stenosis.  . Collagenous colitis   . Colon adenomas 2011  . Constipation    Chronic abdominal pain and constipation  . COPD (chronic obstructive pulmonary disease) ([place])   . Depression   . Diverticulosis   . Esophageal stricture 11/08/2012  . Esophageal ulcer    04/2009 EGD  . GERD (gastroesophageal reflux disease)   . Helicobacter pylori gastritis 2010   Pylera Tx  . History of cholecystectomy   . Hx of appendectomy   . Hx of cancer of lung 1999  . Hx of hysterectomy   . Hyperlipidemia   . Hypothyroidism   . Low back pain   . Osteoarthritis of knee    bilateral knee  . Seizures ([place])   . Stroke [facility])    CVA, hx of 97  . 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 ABDOMINAL HYSTERECTOMY     complete 1992  . ABDOMINAL SURGERY     Exploratory  . APPENDECTOMY    . BALLOON DILATION N/A 11/08/2012   Procedure: BALLOON DILATION;  Surgeon: Gatha Mayer, MD;  Location: [facility] ENDOSCOPY;  Service: Endoscopy;  Laterality: N/A;  . CHOLECYSTECTOMY    . COLONOSCOPY W/ BIOPSIES     multiple  . CT CTA CORONARY W/CA SCORE W/CM &/OR WO/CM  05/2017   Coronary calcium score 2.9. Intermediate risk. Unusual noncalcified plaque in RCA --FFR negative  . ESOPHAGOGASTRODUODENOSCOPY     w/baloon x 2  . ESOPHAGOGASTRODUODENOSCOPY N/A 11/08/2012     Procedure: ESOPHAGOGASTRODUODENOSCOPY (EGD);  Surgeon: Gatha Mayer, MD;  Location: [facility];  Service: Endoscopy;  Laterality: N/A;  . TOTAL HIP ARTHROPLASTY     Left  . TRANSTHORACIC ECHOCARDIOGRAM  04/2017   EF 60-65%. GR 1 DD. Mild AS with SEVERE AI  . TRANSTHORACIC ECHOCARDIOGRAM  10/2017   Calcified aortic valve with mild stenosis and moderate to severe AI (visually appears severe, but holodiastolic flow reversal in the descending thoracic aorta not seen).  Mild to moderate MR.  EF 60 to 65% with GR 1 DD and no R WMA.    Current Meds  Medication Sig  . albuterol (PROVENTIL HFA;VENTOLIN HFA) 108 (90 Base) MCG/ACT inhaler Inhale 2 puffs into the lungs every 6 (six) hours as needed for wheezing.  [name] albuterol (PROVENTIL) (2.5 MG/3ML) 0.083% nebulizer solution Take 3 mLs (2.5 mg total) by nebulization 4 (four) times daily.  [name] ALPRAZolam (XANAX) 0.25 MG tablet Take 1 tablet (0.25 mg total) by mouth at bedtime as needed for anxiety.  [name] aspirin EC 81 MG tablet Take 1 tablet (81 mg total) by mouth daily.  [name]  budesonide-formoterol (SYMBICORT) 160-4.5 MCG/ACT inhaler Inhale 2 puffs into the lungs 2 (two) times daily at 10 AM and 5 PM. Rinse mouth after each use  . cephALEXin (KEFLEX) 500 MG capsule   . cycloSPORINE (RESTASIS) 0.05 % ophthalmic emulsion Place 1 drop into both eyes 2 (two) times daily.  . famotidine (PEPCID) 20 MG tablet One at bedtime (Patient taking differently: Take 20 mg by mouth at bedtime. One at bedtime)  . guaiFENesin (MUCINEX) 600 MG 12 hr tablet Take 1 tablet (600 mg total) by mouth 2 (two) times daily as needed for cough or to loosen phlegm.  [name] HYDROcodone-acetaminophen (NORCO) 10-325 MG tablet Take 1 tablet by mouth every 6 (six) hours as needed for moderate pain or severe pain. Please fill on or after 02/17/18  . ketoconazole (NIZORAL) 2 % cream Apply 1 application topically 2 (two) times daily as needed for irritation.  [name] levothyroxine (SYNTHROID, LEVOTHROID) 150  MCG tablet Take 1 tablet (150 mcg total) by mouth daily.  [name] linaclotide (LINZESS) 290 MCG CAPS capsule Take 1 capsule (290 mcg total) by mouth 3 (three) times a week. TAKE ONE CAPSULE BY MOUTH BEFORE BREAKFAST ON MWF.  [name] NON FORMULARY 2.5 lpm with sleep only  . pantoprazole (PROTONIX) 40 MG tablet Take 1 tablet (40 mg total) by mouth daily. Take 30-60 min before first meal of the day  . PARoxetine (PAXIL) 40 MG tablet Take 1 tablet (40 mg total) by mouth daily.  . phenytoin (DILANTIN) 100 MG ER capsule 100 mg every morning and 200 mg every night.  . promethazine (PHENERGAN) 25 MG tablet Take 1 tablet (25 mg total) by mouth every 8 (eight) hours as needed for nausea or vomiting.  [name] Respiratory Therapy Supplies (FLUTTER) DEVI As directed  . SUMAtriptan (IMITREX) 100 MG tablet TAKE ONE TABLET BY MOUTH EVERY 2 HOURS AS NEEDED FOR  MIGRAINE  OR  HEADACHE.  MAY  REPEAT  IN  2  HOURS  IF  HEADACHE  PERSISTS  OR  RECUR  . traZODone (DESYREL) 100 MG tablet Take 1 tablet (100 mg total) by mouth at bedtime.  . Vitamin D, Ergocalciferol, (DRISDOL) 50000 units CAPS capsule TAKE ONE CAPSULE BY MOUTH ONCE A WEEK    Allergies  Allergen Reactions  . Morphine     "Makes me go crazy."   . Nitrofuran Derivatives     ??confusion  . Oxycodone Hcl Other (See Comments)    Knocked her out for 6 days  . Penicillins Hives    Has patient had a PCN reaction causing immediate rash, facial/tongue/throat swelling, SOB or lightheadedness with hypotension: unknown Has patient had a PCN reaction causing severe rash involving mucus membranes or skin necrosis: unknown Has patient had a PCN reaction that required hospitalization No Has patient had a PCN reaction occurring within the last 10 years: No If all of the above answers are "NO", then may proceed with Cephalosporin use.   . Tape Other (See Comments)    Skin turns red and burns   Social History   Tobacco Use  . Smoking status: Current Every Day Smoker     Packs/day: 1.00    Years: 48.00    Pack years: 48.00    Types: Cigarettes  . Smokeless tobacco: Never Used  Substance Use Topics  . Alcohol use: No  . Drug use: No   Social History   Social History Narrative   Married   2 children   No regular exercise  family history includes Coronary artery disease in her mother and other; Diabetes in her mother and son; Hypertension in her father; Kidney cancer in her sister; Kidney disease in her other.  Wt Readings from Last 3 Encounters:  12/24/17 151 lb 3.2 oz (68.6 kg)  12/13/17 151 lb (68.5 kg)  10/15/17 156 lb (70.8 kg)    PHYSICAL EXAM BP (!) 121/52   Pulse 80   Ht 5\' 5"  (1.651 m)   Wt 151 lb 3.2 oz (68.6 kg)   BMI 25.16 kg/m  Physical Exam  Constitutional: She is oriented to person, place, and time. She appears well-nourished. No distress (Chronically ill-appearing).  She looks older than her stated age.  She is in a wheelchair, profoundly weak with increased work of breathing.  She is well-groomed.  HENT:  Head: Normocephalic and atraumatic.  Mouth/Throat: No oropharyngeal exudate.  Neck: Neck supple. No JVD present. Carotid bruit is present (Left> right).  Cardiovascular: Normal rate and regular rhythm.  Occasional extrasystoles are present. PMI is not displaced. Exam reveals distant heart sounds and decreased pulses (Decreased bilateral pedal pulses). Exam reveals no gallop and no friction rub.  Murmur heard. High-pitched harsh crescendo-decrescendo midsystolic murmur is present with a grade of 2/6 at the upper right sternal border radiating to the neck.  Low-pitched blowing decrescendo holodiastolic murmur is present with a grade of 2/6 at the lower left sternal border. Pretty much heard throughout Pulmonary/Chest: No respiratory distress (Despite accessory muscle use). She has wheezes. She has no rales (Diffuse rhonchi). She exhibits tenderness (Diffuse parasternal tenderness).  Accessory muscle use at rest, but  nonlabored.  No rales but diffuse rhonchi  Abdominal: Soft. Bowel sounds are normal. She exhibits no distension. There is tenderness (Diffuse mild tenderness). There is no rebound and no guarding.  Musculoskeletal: She exhibits edema (Trivial).  Wheelchair-bound  Neurological: She is alert and oriented to person, place, and time.  Skin: She is not diaphoretic.  Psychiatric: Her behavior is normal. Judgment and thought content normal.  Overall she has sort of flat affect but relatively normal mood.  Nursing note and vitals reviewed.   Adult ECG Report Not checked  Other studies Reviewed: Additional studies/ records that were reviewed today include:  Recent Labs:   Lab Results  Component Value Date   CREATININE 0.90 10/30/2017   BUN 8 10/30/2017   NA 138 10/30/2017   K 4.8 10/30/2017   CL 98 10/30/2017   CO2 25 10/30/2017   Lab Results  Component Value Date   CHOL 223 (H) 10/30/2017   HDL 49 10/30/2017   LDLCALC 136 (H) 10/30/2017   LDLDIRECT 187.0 07/14/2015   TRIG 192 (H) 10/30/2017   CHOLHDL 4.6 (H) 10/30/2017    -- Never tolerated Lipitor.   ASSESSMENT / PLAN: Problem List Items Addressed This Visit    Severe Stage C1 aortic regurgitation by prior echocardiography - Primary (Chronic)    Clearly has severe AI, but preserved EF -stage C 1 (normal LV ESD and EF).  Hard to tell if she is actually symptomatic from this or just simply from her COPD.  Mostly because it is so difficult to tell if she is symptomatic from AI versus COPD, I think we will follow-up one more echo in 6 months.  Will discuss with CT surgery and the structural heart team as to potential options.      Relevant Orders   ECHOCARDIOGRAM COMPLETE   Hyperlipidemia with target LDL less than 100 (Chronic)    LDL is  poorly controlled.  She clearly has coronary calcification and needs some type of anti-lipid management.  She is currently on Crestor. -Crestor had just recently been started.  We therefore  recheck in August before determining if we increased dose.      Essential hypertension, benign (Chronic)    Blood pressures actually look pretty well controlled.  She has significant pulse pressure gap consistent with AI.  She is simply only on Imdur which I think we can stop in the absence of chest pain.  Otherwise not really on any medication.      Cigarette smoker (Chronic)    She is essentially at the stage of having quit.  Is currently using nicotine patches. Smoking cessation instruction/counseling given:  commended patient for quitting and reviewed strategies for preventing relapses      Chest pain with low risk for cardiac etiology (Chronic)    Nonobstructive CAD by coronary CT angiogram.  She probably does have musculoskeletal chest pain.      Relevant Orders   ECHOCARDIOGRAM COMPLETE      Current medicines are reviewed at length with the patient today. (+/- concerns) n/a The following changes have been made: n/a  Patient Instructions  NO MEDICATION CHANGES    WILL MAIL LABSLIP TO YOU AUG  2019  LIPID , CMP     CONGRATULATION ON FOLLOWING THROUGH WITH QUITTING SMOKING.   SCHEDULE AT [place] has requested that you have an echocardiogram. Echocardiography is a painless test that uses sound waves to create images of your heart. It provides your doctor with information about the size and shape of your heart and how well your heart's chambers and valves are working. This procedure takes approximately one hour. There are no restrictions for this procedure.   Your physician wants you to follow-up in [place] . You will receive a reminder letter in the mail two months in advance. If you don't receive a letter, please call our office to schedule the follow-up appointment.  If you need a refill on your cardiac medications before your next appointment, please call your pharmacy.    Studies Ordered:   Orders  Placed This Encounter  Procedures  . ECHOCARDIOGRAM COMPLETE      [name], M.D., M.S. Interventional Cardiologist   Pager # [phone] Phone # [phone] [address] [place], [place] [address]   Indications for aortic valve surgery-The following recommendations for aortic valve surgery (largely aortic valve replacement) in patients with chronic AR are adopted from the 2014 [place] of Cardiology (AHA/ACC) valve guidelines (figure 1 and table 4): ?Aortic valve surgery is recommended for symptomatic patients with severe AR (stage D) (table 2B), regardless of LV systolic function. ?Aortic valve surgery is recommended for asymptomatic patients with chronic severe AR and evidence of LV systolic dysfunction with an EF <50 percent (stage C2). ?Aortic valve surgery is suggested for asymptomatic patients with chronic severe AR with a normal LVEF (?50 percent) but with an end-systolic dimension >42 mm (stage C2).  ?Aortic valve surgery is suggested for asymptomatic patients with severe AR and normal LVEF (?50 percent, stage C) but with progressive severe LV dilation (LVEDD >65 mm), if surgical risk is low. ?Aortic valve surgery is recommended for patients with severe AR (stage C or D) while undergoing cardiac surgery for other indications. ?Aortic valve surgery is suggested in patients with moderate AR (stage B) who are undergoing other cardiac surgery.

## 2017-12-25 DIAGNOSIS — R3915 Urgency of urination: Secondary | ICD-10-CM | POA: Diagnosis not present

## 2017-12-25 DIAGNOSIS — B9620 Unspecified Escherichia coli [E. coli] as the cause of diseases classified elsewhere: Secondary | ICD-10-CM | POA: Diagnosis not present

## 2017-12-25 DIAGNOSIS — N3941 Urge incontinence: Secondary | ICD-10-CM | POA: Diagnosis not present

## 2017-12-25 DIAGNOSIS — N3000 Acute cystitis without hematuria: Secondary | ICD-10-CM | POA: Diagnosis not present

## 2017-12-25 DIAGNOSIS — N390 Urinary tract infection, site not specified: Secondary | ICD-10-CM | POA: Diagnosis not present

## 2017-12-27 ENCOUNTER — Encounter: Payer: Self-pay | Admitting: Cardiology

## 2017-12-27 NOTE — Assessment & Plan Note (Signed)
LDL is poorly controlled.  She clearly has coronary calcification and needs some type of anti-lipid management.  She is currently on Crestor. -Crestor had just recently been started.  We therefore recheck in August before determining if we increased dose.

## 2017-12-27 NOTE — Assessment & Plan Note (Addendum)
Clearly has severe AI, but preserved EF -stage C 1 (normal LV ESD and EF).  Hard to tell if she is actually symptomatic from this or just simply from her COPD.  Mostly because it is so difficult to tell if she is symptomatic from AI versus COPD, I think we will follow-up one more echo in 6 months.  Will discuss with CT surgery and the structural heart team as to potential options.

## 2017-12-27 NOTE — Assessment & Plan Note (Signed)
Nonobstructive CAD by coronary CT angiogram.  She probably does have musculoskeletal chest pain.

## 2017-12-27 NOTE — Assessment & Plan Note (Signed)
Blood pressures actually look pretty well controlled.  She has significant pulse pressure gap consistent with AI.  She is simply only on Imdur which I think we can stop in the absence of chest pain.  Otherwise not really on any medication.

## 2017-12-27 NOTE — Assessment & Plan Note (Signed)
She is essentially at the stage of having quit.  Is currently using nicotine patches. Smoking cessation instruction/counseling given:  commended patient for quitting and reviewed strategies for preventing relapses

## 2017-12-31 ENCOUNTER — Other Ambulatory Visit: Payer: Self-pay | Admitting: Cardiology

## 2018-01-14 ENCOUNTER — Other Ambulatory Visit: Payer: Self-pay | Admitting: Cardiology

## 2018-01-16 DIAGNOSIS — C3490 Malignant neoplasm of unspecified part of unspecified bronchus or lung: Secondary | ICD-10-CM | POA: Diagnosis not present

## 2018-02-05 DIAGNOSIS — N3000 Acute cystitis without hematuria: Secondary | ICD-10-CM | POA: Diagnosis not present

## 2018-02-11 ENCOUNTER — Other Ambulatory Visit: Payer: Self-pay | Admitting: Internal Medicine

## 2018-02-11 NOTE — Telephone Encounter (Signed)
Pls advise if ok to refill../lmb 

## 2018-02-16 DIAGNOSIS — C3490 Malignant neoplasm of unspecified part of unspecified bronchus or lung: Secondary | ICD-10-CM | POA: Diagnosis not present

## 2018-02-28 ENCOUNTER — Other Ambulatory Visit (INDEPENDENT_AMBULATORY_CARE_PROVIDER_SITE_OTHER): Payer: Medicare HMO

## 2018-02-28 ENCOUNTER — Encounter: Payer: Self-pay | Admitting: Family

## 2018-02-28 ENCOUNTER — Ambulatory Visit (INDEPENDENT_AMBULATORY_CARE_PROVIDER_SITE_OTHER)
Admission: RE | Admit: 2018-02-28 | Discharge: 2018-02-28 | Disposition: A | Discharge status: Home / Self Care | Payer: Medicare HMO | Source: Ambulatory Visit | Attending: Family | Admitting: Family

## 2018-02-28 ENCOUNTER — Ambulatory Visit (INDEPENDENT_AMBULATORY_CARE_PROVIDER_SITE_OTHER): Payer: Medicare HMO | Admitting: Family

## 2018-02-28 VITALS — BP 124/52 | HR 81 | Temp 99.0°F | Ht 65.0 in | Wt 149.0 lb

## 2018-02-28 DIAGNOSIS — R112 Nausea with vomiting, unspecified: Secondary | ICD-10-CM | POA: Diagnosis not present

## 2018-02-28 DIAGNOSIS — R1012 Left upper quadrant pain: Secondary | ICD-10-CM | POA: Diagnosis not present

## 2018-02-28 DIAGNOSIS — R1110 Vomiting, unspecified: Secondary | ICD-10-CM | POA: Diagnosis not present

## 2018-02-28 LAB — CBC WITH DIFFERENTIAL/PLATELET
Basophils Absolute: 0.1 10*3/uL (ref 0.0–0.1)
Basophils Relative: 1.2 % (ref 0.0–3.0)
Eosinophils Absolute: 0.2 10*3/uL (ref 0.0–0.7)
Eosinophils Relative: 3.1 % (ref 0.0–5.0)
HCT: 38.1 % (ref 36.0–46.0)
Hemoglobin: 12.9 g/dL (ref 12.0–15.0)
Lymphocytes Relative: 26.9 % (ref 12.0–46.0)
Lymphs Abs: 1.8 10*3/uL (ref 0.7–4.0)
MCHC: 33.7 g/dL (ref 30.0–36.0)
MCV: 95.6 fl (ref 78.0–100.0)
Monocytes Absolute: 0.5 10*3/uL (ref 0.1–1.0)
Monocytes Relative: 7.4 % (ref 3.0–12.0)
Neutro Abs: 4.2 10*3/uL (ref 1.4–7.7)
Neutrophils Relative %: 61.4 % (ref 43.0–77.0)
Platelets: 263 10*3/uL (ref 150.0–400.0)
RBC: 3.99 Mil/uL (ref 3.87–5.11)
RDW: 14.5 % (ref 11.5–15.5)
WBC: 6.8 10*3/uL (ref 4.0–10.5)

## 2018-02-28 LAB — COMPREHENSIVE METABOLIC PANEL
ALT: 21 U/L (ref 0–35)
AST: 19 U/L (ref 0–37)
Albumin: 3.9 g/dL (ref 3.5–5.2)
Alkaline Phosphatase: 122 U/L — ABNORMAL HIGH (ref 39–117)
BUN: 11 mg/dL (ref 6–23)
CO2: 33 mEq/L — ABNORMAL HIGH (ref 19–32)
Calcium: 9.5 mg/dL (ref 8.4–10.5)
Chloride: 104 mEq/L (ref 96–112)
Creatinine, Ser: 1.03 mg/dL (ref 0.40–1.20)
GFR: 57.3 mL/min — ABNORMAL LOW (ref >60.00)
Glucose, Bld: 95 mg/dL (ref 70–99)
Potassium: 4.8 mEq/L (ref 3.5–5.1)
Sodium: 139 mEq/L (ref 135–145)
Total Bilirubin: 0.4 mg/dL (ref 0.2–1.2)
Total Protein: 6.6 g/dL (ref 6.0–8.3)

## 2018-02-28 LAB — AMYLASE: Amylase: 40 U/L (ref 27–131)

## 2018-02-28 LAB — LIPASE: Lipase: 23 U/L (ref 11.0–59.0)

## 2018-02-28 MED ORDER — IOPAMIDOL (ISOVUE-300) INJECTION 61%
100.0000 mL | Freq: Once | INTRAVENOUS | Status: AC | PRN
  Administered 2018-02-28: 100 mL via INTRAVENOUS
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
## 2018-03-01 ENCOUNTER — Telehealth: Payer: Self-pay | Admitting: *Deleted

## 2018-03-01 ENCOUNTER — Other Ambulatory Visit: Payer: Medicare HMO

## 2018-03-01 DIAGNOSIS — E785 Hyperlipidemia, unspecified: Secondary | ICD-10-CM

## 2018-03-01 NOTE — Telephone Encounter (Signed)
-----   Message from Raiford Simmonds, RN sent at 11/21/2017 11:20 AM EDT ----- DUE SEPT 30 ,2019 HEPATIC , LIPID  ORDER LABS 3-4 MONTHS [email or web address]  29 ,2019

## 2018-03-01 NOTE — Telephone Encounter (Signed)
MAILED LETTER AND LABSLIP

## 2018-03-13 ENCOUNTER — Encounter: Payer: Self-pay | Admitting: Internal Medicine

## 2018-03-13 ENCOUNTER — Ambulatory Visit (INDEPENDENT_AMBULATORY_CARE_PROVIDER_SITE_OTHER): Payer: Medicare HMO | Admitting: Internal Medicine

## 2018-03-13 VITALS — BP 114/50 | HR 85 | Temp 98.1°F | Resp 16 | Ht 65.0 in | Wt 151.0 lb

## 2018-03-13 DIAGNOSIS — Z23 Encounter for immunization: Secondary | ICD-10-CM | POA: Diagnosis not present

## 2018-03-13 DIAGNOSIS — I10 Essential (primary) hypertension: Secondary | ICD-10-CM

## 2018-03-13 DIAGNOSIS — R69 Illness, unspecified: Secondary | ICD-10-CM | POA: Diagnosis not present

## 2018-03-13 DIAGNOSIS — G40909 Epilepsy, unspecified, not intractable, without status epilepticus: Secondary | ICD-10-CM | POA: Diagnosis not present

## 2018-03-13 DIAGNOSIS — E559 Vitamin D deficiency, unspecified: Secondary | ICD-10-CM

## 2018-03-13 DIAGNOSIS — R296 Repeated falls: Secondary | ICD-10-CM

## 2018-03-13 DIAGNOSIS — R5382 Chronic fatigue, unspecified: Secondary | ICD-10-CM

## 2018-03-13 DIAGNOSIS — F411 Generalized anxiety disorder: Secondary | ICD-10-CM

## 2018-03-13 DIAGNOSIS — E785 Hyperlipidemia, unspecified: Secondary | ICD-10-CM | POA: Diagnosis not present

## 2018-03-13 DIAGNOSIS — G9332 Myalgic encephalomyelitis/chronic fatigue syndrome: Secondary | ICD-10-CM

## 2018-03-13 DIAGNOSIS — F321 Major depressive disorder, single episode, moderate: Secondary | ICD-10-CM

## 2018-03-13 DIAGNOSIS — K5901 Slow transit constipation: Secondary | ICD-10-CM

## 2018-03-13 DIAGNOSIS — M5440 Lumbago with sciatica, unspecified side: Secondary | ICD-10-CM

## 2018-03-13 DIAGNOSIS — G8929 Other chronic pain: Secondary | ICD-10-CM

## 2018-03-13 MED ORDER — HYDROCODONE-ACETAMINOPHEN 10-325 MG PO TABS: 1.0000 | ORAL_TABLET | Freq: Four times a day (QID) | ORAL | 0 refills | Status: DC | PRN

## 2018-03-13 NOTE — Patient Instructions (Signed)
Valerian root for insomnia 

## 2018-03-13 NOTE — Assessment & Plan Note (Signed)
No repeat falls W/c

## 2018-03-13 NOTE — Progress Notes (Signed)
Subjective:  Patient ID: Kaylee Hansen, female    DOB: 07-04-1955  Age: 62 y.o. MRN: [id number]  CC: No chief complaint on file.   HPI Kaylee Hansen presents for LBP, constipation, anxiety f/u  Outpatient Medications Prior to Visit  Medication Sig Dispense Refill  . albuterol (PROVENTIL HFA;VENTOLIN HFA) 108 (90 Base) MCG/ACT inhaler Inhale 2 puffs into the lungs every 6 (six) hours as needed for wheezing. 1 Inhaler 5  . albuterol (PROVENTIL) (2.5 MG/3ML) 0.083% nebulizer solution Take 3 mLs (2.5 mg total) by nebulization 4 (four) times daily. 120 vial 11  . ALPRAZolam (XANAX) 0.25 MG tablet Take 1 tablet (0.25 mg total) by mouth at bedtime as needed for anxiety. 90 tablet 1  . aspirin EC 81 MG tablet Take 1 tablet (81 mg total) by mouth daily. 90 tablet 3  . budesonide-formoterol (SYMBICORT) 160-4.5 MCG/ACT inhaler Inhale 2 puffs into the lungs 2 (two) times daily at 10 AM and 5 PM. Rinse mouth after each use 1 Inhaler 0  . cephALEXin (KEFLEX) 500 MG capsule     . cycloSPORINE (RESTASIS) 0.05 % ophthalmic emulsion Place 1 drop into both eyes 2 (two) times daily. 0.4 mL 5  . famotidine (PEPCID) 20 MG tablet One at bedtime (Patient taking differently: Take 20 mg by mouth at bedtime. One at bedtime) 30 tablet 2  . furosemide (LASIX) 20 MG tablet TAKE 1 TABLET BY MOUTH ONCE DAILY 30 tablet 6  . guaiFENesin (MUCINEX) 600 MG 12 hr tablet Take 1 tablet (600 mg total) by mouth 2 (two) times daily as needed for cough or to loosen phlegm. 14 tablet 0  . HYDROcodone-acetaminophen (NORCO) 10-325 MG tablet Take 1 tablet by mouth every 6 (six) hours as needed for moderate pain or severe pain. Please fill on or after 02/17/18 120 tablet 0  . isosorbide mononitrate (IMDUR) 30 MG 24 hr tablet TAKE 1 TABLET BY MOUTH ONCE DAILY 90 tablet 1  . ketoconazole (NIZORAL) 2 % cream Apply 1 application topically 2 (two) times daily as needed for irritation. 60 g 0  . levothyroxine (SYNTHROID, LEVOTHROID) 150 MCG  tablet Take 1 tablet (150 mcg total) by mouth daily. 90 tablet 3  . linaclotide (LINZESS) 290 MCG CAPS capsule Take 1 capsule (290 mcg total) by mouth 3 (three) times a week. TAKE ONE CAPSULE BY MOUTH BEFORE BREAKFAST ON MWF. 90 capsule 3  . NON FORMULARY 2.5 lpm with sleep only    . pantoprazole (PROTONIX) 40 MG tablet Take 1 tablet (40 mg total) by mouth daily. Take 30-60 min before first meal of the day 90 tablet 3  . PARoxetine (PAXIL) 40 MG tablet Take 1 tablet (40 mg total) by mouth daily. 90 tablet 3  . phenytoin (DILANTIN) 100 MG ER capsule TAKE 100 MG EVERY MORNING AND 200 MG EVERY NIGHT. 270 capsule 3  . promethazine (PHENERGAN) 25 MG tablet Take 1 tablet (25 mg total) by mouth every 8 (eight) hours as needed for nausea or vomiting. 90 tablet 1  . Respiratory Therapy Supplies (FLUTTER) DEVI As directed 1 each 0  . SUMAtriptan (IMITREX) 100 MG tablet TAKE ONE TABLET BY MOUTH EVERY 2 HOURS AS NEEDED FOR  MIGRAINE  OR  HEADACHE.  MAY  REPEAT  IN  2  HOURS  IF  HEADACHE  PERSISTS  OR  RECUR 9 tablet 2  . traZODone (DESYREL) 100 MG tablet Take 1 tablet (100 mg total) by mouth at bedtime. 90 tablet 3  . Vitamin  D, Ergocalciferol, (DRISDOL) 50000 units CAPS capsule TAKE ONE CAPSULE BY MOUTH ONCE A WEEK 12 capsule 2  . rosuvastatin (CRESTOR) 20 MG tablet Take 1 tablet (20 mg total) by mouth daily. 90 tablet 3   No facility-administered medications prior to visit.     ROS: Review of Systems  Constitutional: Positive for fatigue. Negative for activity change, appetite change, chills and unexpected weight change.  HENT: Negative for congestion, mouth sores and sinus pressure.   Eyes: Negative for visual disturbance.  Respiratory: Negative for cough and chest tightness.   Gastrointestinal: Negative for abdominal pain and nausea.  Genitourinary: Negative for difficulty urinating, frequency and vaginal pain.  Musculoskeletal: Positive for arthralgias, back pain and gait problem.  Skin: Negative  for pallor and rash.  Neurological: Positive for dizziness, weakness and numbness. Negative for tremors and headaches.  Psychiatric/Behavioral: Positive for decreased concentration, dysphoric mood and sleep disturbance. Negative for confusion and suicidal ideas. The patient is nervous/anxious.     Objective:  BP (!) 114/50   Pulse 85   Temp 98.1 F (36.7 C) (Oral)   Resp 16   Ht 5\' 5"  (1.651 m)   Wt 151 lb (68.5 kg)   SpO2 96%   BMI 25.13 kg/m   BP Readings from Last 3 Encounters:  03/13/18 (!) 114/50  02/28/18 (!) 124/52  12/24/17 (!) 121/52    Wt Readings from Last 3 Encounters:  03/13/18 151 lb (68.5 kg)  02/28/18 149 lb (67.6 kg)  12/24/17 151 lb 3.2 oz (68.6 kg)    Physical Exam  Constitutional: She appears well-developed. No distress.  HENT:  Head: Normocephalic.  Right Ear: External ear normal.  Left Ear: External ear normal.  Nose: Nose normal.  Mouth/Throat: Oropharynx is clear and moist.  Eyes: Pupils are equal, round, and reactive to light. Conjunctivae are normal. Right eye exhibits no discharge. Left eye exhibits no discharge.  Neck: Normal range of motion. Neck supple. No JVD present. No tracheal deviation present. No thyromegaly present.  Cardiovascular: Normal rate, regular rhythm and normal heart sounds.  Pulmonary/Chest: No stridor. No respiratory distress. She has no wheezes.  Abdominal: Soft. Bowel sounds are normal. She exhibits no distension and no mass. There is no tenderness. There is no rebound and no guarding.  Musculoskeletal: She exhibits tenderness. She exhibits no edema.  Lymphadenopathy:    She has no cervical adenopathy.  Neurological: She displays normal reflexes. No cranial nerve deficit. She exhibits normal muscle tone. Coordination abnormal.  Skin: No rash noted. No erythema.  Psychiatric: She has a normal mood and affect. Her behavior is normal. Judgment and thought content normal.    Lab Results  Component Value Date   WBC 6.8  02/28/2018   HGB 12.9 02/28/2018   HCT 38.1 02/28/2018   PLT 263.0 02/28/2018   GLUCOSE 95 02/28/2018   CHOL 223 (H) 10/30/2017   TRIG 192 (H) 10/30/2017   HDL 49 10/30/2017   LDLDIRECT 187.0 07/14/2015   LDLCALC 136 (H) 10/30/2017   ALT 21 02/28/2018   AST 19 02/28/2018   NA 139 02/28/2018   K 4.8 02/28/2018   CL 104 02/28/2018   CREATININE 1.03 02/28/2018   BUN 11 02/28/2018   CO2 33 (H) 02/28/2018   TSH 0.46 07/14/2015   INR 1.0 09/16/2008   HGBA1C  09/17/2008    5.0 (NOTE)   The ADA recommends the following therapeutic goal for glycemic   control related to Hgb A1C measurement:   Goal of Therapy:   <  7.0% Hgb A1C   Reference: American Diabetes Association: Clinical Practice   Recommendations 2008, Diabetes Care,  2008, 31:(Suppl 1).    Ct Abdomen Pelvis W Contrast  Result Date: 02/28/2018 CLINICAL DATA:  LEFT upper quadrant pain, nausea and vomiting for 4 days. History of lung cancer, brain tumor, cholecystectomy, appendectomy, hysterectomy. EXAM: CT ABDOMEN AND PELVIS WITH CONTRAST TECHNIQUE: Multidetector CT imaging of the abdomen and pelvis was performed using the standard protocol following bolus administration of intravenous contrast. CONTRAST:  121mL ISOVUE-300 IOPAMIDOL (ISOVUE-300) INJECTION 61% COMPARISON:  CT abdomen and pelvis May 21, 2012 FINDINGS: LOWER CHEST: Lung bases are clear. Included heart size is normal. No pericardial effusion. HEPATOBILIARY: Status post cholecystectomy. Subcentimeter hypodensity segment 4 of the liver, probable cyst. PANCREAS: Normal. SPLEEN: Normal. ADRENALS/URINARY TRACT: Kidneys are orthotopic, demonstrating symmetric enhancement. No nephrolithiasis, hydronephrosis or solid renal masses. Bilateral homogeneously hypodense benign-appearing cysts measuring to 18 mm in addition to too small to characterize hypodensities bilateral kidneys. The unopacified ureters are normal in course and caliber. Delayed imaging through the kidneys  demonstrates symmetric prompt contrast excretion within the proximal urinary collecting system. Urinary bladder is partially distended and unremarkable. Normal adrenal glands. STOMACH/BOWEL: The stomach, small and large bowel are normal in course and caliber without inflammatory changes. Moderate amount of retained large bowel stool. VASCULAR/LYMPHATIC: Aortoiliac vessels are normal in course and caliber. Mild-to-moderate calcific atherosclerosis and intimal thickening. No lymphadenopathy by CT size criteria. REPRODUCTIVE: Status post hysterectomy. OTHER: No intraperitoneal free fluid or free air. MUSCULOSKELETAL: Nonacute. Mild fat stranding anterior to LEFT piriformis muscle on axial images confirmed as artifact on reformations. Similar focal sclerosis RIGHT femoral head concerning for AVN without collapse. Osteopenia. Streak artifact from LEFT hip arthroplasty. LEFT obturator muscle atrophy. Old bilateral rib fractures. Subacute LEFT anterior seventh rib fracture. Gluteal injection granulomas. Anterior abdominal wall scarring. IMPRESSION: Moderate amount of retained large bowel stool without bowel obstruction or acute intra-abdominal/pelvic process. Aortic Atherosclerosis (ICD10-I70.0). Electronically Signed   By: Elon Alas M.D.   On: 02/28/2018 16:47    Assessment & Plan:   Diagnoses and all orders for this visit:  Need for influenza vaccination     No orders of the defined types were placed in this encounter.    Follow-up: No follow-ups on file.  Walker Kehr, MD

## 2018-03-13 NOTE — Assessment & Plan Note (Signed)
Vit D 

## 2018-03-13 NOTE — Assessment & Plan Note (Signed)
Statin intolerant 

## 2018-03-13 NOTE — Assessment & Plan Note (Signed)
Xanax prn  Potential benefits of a long term benzodiazepines  use as well as potential risks  and complications were explained to the patient and were aknowledged. 

## 2018-03-13 NOTE — Assessment & Plan Note (Signed)
Miralax prn  Linzess prn Lactulose - did not like Dulcolax prn

## 2018-03-13 NOTE — Assessment & Plan Note (Signed)
No change 

## 2018-03-13 NOTE — Assessment & Plan Note (Signed)
Norco  Potential benefits of a long term opioids use as well as potential risks (i.e. addiction risk, apnea etc) and complications (i.e. Somnolence, constipation and others) were explained to the patient and were aknowledged. 

## 2018-03-13 NOTE — Assessment & Plan Note (Signed)
NAS diet 

## 2018-03-13 NOTE — Assessment & Plan Note (Signed)
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  

## 2018-03-13 NOTE — Assessment & Plan Note (Signed)
Trazodone at hs Paxil

## 2018-03-19 DIAGNOSIS — C3490 Malignant neoplasm of unspecified part of unspecified bronchus or lung: Secondary | ICD-10-CM | POA: Diagnosis not present

## 2018-03-19 DIAGNOSIS — E785 Hyperlipidemia, unspecified: Secondary | ICD-10-CM | POA: Diagnosis not present

## 2018-03-19 LAB — HEPATIC FUNCTION PANEL
ALT: 21 IU/L (ref 0–32)
AST: 18 IU/L (ref 0–40)
Albumin: 3.8 g/dL (ref 3.6–4.8)
Alkaline Phosphatase: 125 IU/L — ABNORMAL HIGH (ref 39–117)
Bilirubin Total: <0.2 mg/dL (ref 0.0–1.2)
Bilirubin, Direct: 0.07 mg/dL (ref 0.00–0.40)
Total Protein: 6.2 g/dL (ref 6.0–8.5)

## 2018-03-19 LAB — LIPID PANEL
Chol/HDL Ratio: 4.8 ratio — ABNORMAL HIGH (ref 0.0–4.4)
Cholesterol, Total: 228 mg/dL — ABNORMAL HIGH (ref 100–199)
HDL: 48 mg/dL (ref >39)
LDL Calculated: 142 mg/dL — ABNORMAL HIGH (ref 0–99)
Triglycerides: 191 mg/dL — ABNORMAL HIGH (ref 0–149)
VLDL Cholesterol Cal: 38 mg/dL (ref 5–40)

## 2018-03-21 DIAGNOSIS — R310 Gross hematuria: Secondary | ICD-10-CM | POA: Diagnosis not present

## 2018-03-21 DIAGNOSIS — N3000 Acute cystitis without hematuria: Secondary | ICD-10-CM | POA: Diagnosis not present

## 2018-04-01 DIAGNOSIS — M7061 Trochanteric bursitis, right hip: Secondary | ICD-10-CM | POA: Diagnosis not present

## 2018-04-01 DIAGNOSIS — M7062 Trochanteric bursitis, left hip: Secondary | ICD-10-CM | POA: Diagnosis not present

## 2018-04-01 DIAGNOSIS — M7071 Other bursitis of hip, right hip: Secondary | ICD-10-CM | POA: Diagnosis not present

## 2018-04-01 DIAGNOSIS — M7072 Other bursitis of hip, left hip: Secondary | ICD-10-CM | POA: Diagnosis not present

## 2018-04-02 ENCOUNTER — Telehealth: Payer: Self-pay | Admitting: *Deleted

## 2018-04-02 MED ORDER — ROSUVASTATIN CALCIUM 40 MG PO TABS: 40.0000 mg | ORAL_TABLET | Freq: Every day | ORAL | 3 refills | Status: DC

## 2018-04-02 NOTE — Telephone Encounter (Signed)
-----   Message from Leonie Man, MD sent at 03/31/2018 11:11 PM EDT ----- No appreciable change in cholesterol levels from May until end of September --> recommend increasing Crestor/rosuvastatin to 40 mg daily.  Please change prescription to 40 mg rosuvastatin, 1 tablet p.o. Daily. Dispense 90 tablets, 4 refills.  [name], MD

## 2018-04-02 NOTE — Telephone Encounter (Signed)
Spoke to patient. Result given . Verbalized understanding  new prescription e-sent to pharmacy 40 mg rosuvastatin one daily #90 x3 refills

## 2018-04-18 DIAGNOSIS — C3490 Malignant neoplasm of unspecified part of unspecified bronchus or lung: Secondary | ICD-10-CM | POA: Diagnosis not present

## 2018-04-26 ENCOUNTER — Other Ambulatory Visit: Payer: Self-pay | Admitting: Internal Medicine

## 2018-04-28 ENCOUNTER — Other Ambulatory Visit: Payer: Self-pay | Admitting: Internal Medicine

## 2018-05-08 DIAGNOSIS — N3000 Acute cystitis without hematuria: Secondary | ICD-10-CM | POA: Diagnosis not present

## 2018-05-08 DIAGNOSIS — N952 Postmenopausal atrophic vaginitis: Secondary | ICD-10-CM | POA: Diagnosis not present

## 2018-05-19 DIAGNOSIS — C3490 Malignant neoplasm of unspecified part of unspecified bronchus or lung: Secondary | ICD-10-CM | POA: Diagnosis not present

## 2018-05-26 HISTORY — PX: TRANSTHORACIC ECHOCARDIOGRAM: SHX275

## 2018-05-29 ENCOUNTER — Other Ambulatory Visit: Payer: Self-pay | Admitting: Internal Medicine

## 2018-05-29 DIAGNOSIS — M25562 Pain in left knee: Secondary | ICD-10-CM | POA: Diagnosis not present

## 2018-05-29 DIAGNOSIS — M1712 Unilateral primary osteoarthritis, left knee: Secondary | ICD-10-CM | POA: Diagnosis not present

## 2018-05-29 DIAGNOSIS — M25552 Pain in left hip: Secondary | ICD-10-CM | POA: Diagnosis not present

## 2018-05-29 DIAGNOSIS — M7062 Trochanteric bursitis, left hip: Secondary | ICD-10-CM | POA: Diagnosis not present

## 2018-06-10 ENCOUNTER — Ambulatory Visit: Payer: Medicare HMO | Admitting: Cardiology

## 2018-06-12 ENCOUNTER — Encounter: Payer: Self-pay | Admitting: Internal Medicine

## 2018-06-12 ENCOUNTER — Other Ambulatory Visit: Payer: Self-pay | Admitting: Internal Medicine

## 2018-06-12 ENCOUNTER — Ambulatory Visit (INDEPENDENT_AMBULATORY_CARE_PROVIDER_SITE_OTHER): Payer: Medicare HMO | Admitting: Internal Medicine

## 2018-06-12 VITALS — BP 128/62 | HR 82 | Temp 98.4°F | Ht 65.0 in | Wt 149.0 lb

## 2018-06-12 DIAGNOSIS — R300 Dysuria: Secondary | ICD-10-CM

## 2018-06-12 DIAGNOSIS — B078 Other viral warts: Secondary | ICD-10-CM

## 2018-06-12 DIAGNOSIS — M5440 Lumbago with sciatica, unspecified side: Secondary | ICD-10-CM | POA: Diagnosis not present

## 2018-06-12 DIAGNOSIS — I10 Essential (primary) hypertension: Secondary | ICD-10-CM | POA: Diagnosis not present

## 2018-06-12 DIAGNOSIS — J440 Chronic obstructive pulmonary disease with acute lower respiratory infection: Secondary | ICD-10-CM | POA: Diagnosis not present

## 2018-06-12 DIAGNOSIS — R69 Illness, unspecified: Secondary | ICD-10-CM | POA: Diagnosis not present

## 2018-06-12 DIAGNOSIS — G8929 Other chronic pain: Secondary | ICD-10-CM

## 2018-06-12 DIAGNOSIS — E559 Vitamin D deficiency, unspecified: Secondary | ICD-10-CM

## 2018-06-12 DIAGNOSIS — F321 Major depressive disorder, single episode, moderate: Secondary | ICD-10-CM

## 2018-06-12 DIAGNOSIS — E034 Atrophy of thyroid (acquired): Secondary | ICD-10-CM | POA: Diagnosis not present

## 2018-06-12 MED ORDER — DOXYCYCLINE HYCLATE 100 MG PO TABS: 100.0000 mg | ORAL_TABLET | Freq: Two times a day (BID) | ORAL | 0 refills | Status: DC

## 2018-06-12 MED ORDER — FLUCONAZOLE 150 MG PO TABS: 150.0000 mg | ORAL_TABLET | Freq: Once | ORAL | 1 refills | Status: AC

## 2018-06-12 MED ORDER — KETOCONAZOLE 2 % EX CREA: 1.0000 "application " | TOPICAL_CREAM | Freq: Two times a day (BID) | CUTANEOUS | 3 refills | Status: DC | PRN

## 2018-06-12 MED ORDER — LEVOTHYROXINE SODIUM 150 MCG PO TABS: 150.0000 ug | ORAL_TABLET | Freq: Every day | ORAL | 3 refills | Status: DC

## 2018-06-12 MED ORDER — HYDROCODONE-ACETAMINOPHEN 10-325 MG PO TABS: 1.0000 | ORAL_TABLET | Freq: Four times a day (QID) | ORAL | 0 refills | Status: DC | PRN

## 2018-06-12 MED ORDER — ALPRAZOLAM 0.25 MG PO TABS: 0.2500 mg | ORAL_TABLET | Freq: Every evening | ORAL | 1 refills | Status: DC | PRN

## 2018-06-12 MED ORDER — PAROXETINE HCL 40 MG PO TABS: 40.0000 mg | ORAL_TABLET | Freq: Every day | ORAL | 3 refills | Status: DC

## 2018-06-12 NOTE — Assessment & Plan Note (Signed)
R ear - see Cryo

## 2018-06-12 NOTE — Assessment & Plan Note (Signed)
Paxil 

## 2018-06-12 NOTE — Assessment & Plan Note (Signed)
S/p Urology eval Diflucan

## 2018-06-12 NOTE — Assessment & Plan Note (Signed)
On Levothroid 

## 2018-06-12 NOTE — Assessment & Plan Note (Signed)
Furosemide.

## 2018-06-12 NOTE — Progress Notes (Signed)
Subjective:  Patient ID: Kaylee Hansen, female    DOB: [birth year]  Age: 62 y.o. MRN: [id number]  CC: No chief complaint on file.   HPI Kaylee Hansen presents for chronic pain, COPD, anxiety C/o a wart on R ear   Outpatient Medications Prior to Visit  Medication Sig Dispense Refill  . albuterol (PROVENTIL HFA;VENTOLIN HFA) 108 (90 Base) MCG/ACT inhaler Inhale 2 puffs into the lungs every 6 (six) hours as needed for wheezing. 1 Inhaler 5  . albuterol (PROVENTIL) (2.5 MG/3ML) 0.083% nebulizer solution Take 3 mLs (2.5 mg total) by nebulization 4 (four) times daily. 120 vial 11  . ALPRAZolam (XANAX) 0.25 MG tablet Take 1 tablet (0.25 mg total) by mouth at bedtime as needed for anxiety. 90 tablet 1  . aspirin EC 81 MG tablet Take 1 tablet (81 mg total) by mouth daily. 90 tablet 3  . budesonide-formoterol (SYMBICORT) 160-4.5 MCG/ACT inhaler Inhale 2 puffs into the lungs 2 (two) times daily at 10 AM and 5 PM. Rinse mouth after each use 1 Inhaler 0  . cephALEXin (KEFLEX) 500 MG capsule     . cycloSPORINE (RESTASIS) 0.05 % ophthalmic emulsion Place 1 drop into both eyes 2 (two) times daily. 0.4 mL 5  . famotidine (PEPCID) 20 MG tablet One at bedtime (Patient taking differently: Take 20 mg by mouth at bedtime. One at bedtime) 30 tablet 2  . furosemide (LASIX) 20 MG tablet TAKE 1 TABLET BY MOUTH ONCE DAILY 30 tablet 6  . guaiFENesin (MUCINEX) 600 MG 12 hr tablet Take 1 tablet (600 mg total) by mouth 2 (two) times daily as needed for cough or to loosen phlegm. 14 tablet 0  . HYDROcodone-acetaminophen (NORCO) 10-325 MG tablet Take 1 tablet by mouth every 6 (six) hours as needed for severe pain. Please fill on or after 04/19/18 120 tablet 0  . isosorbide mononitrate (IMDUR) 30 MG 24 hr tablet TAKE 1 TABLET BY MOUTH ONCE DAILY 90 tablet 1  . ketoconazole (NIZORAL) 2 % cream Apply 1 application topically 2 (two) times daily as needed for irritation. 60 g 0  . levothyroxine (SYNTHROID, LEVOTHROID) 150  MCG tablet TAKE 1 TABLET BY MOUTH EVERY DAY 90 tablet 3  . linaclotide (LINZESS) 290 MCG CAPS capsule Take 1 capsule (290 mcg total) by mouth 3 (three) times a week. TAKE ONE CAPSULE BY MOUTH BEFORE BREAKFAST ON MWF. 90 capsule 3  . NON FORMULARY 2.5 lpm with sleep only    . pantoprazole (PROTONIX) 40 MG tablet Take 1 tablet (40 mg total) by mouth daily. Take 30-60 min before first meal of the day 90 tablet 3  . PARoxetine (PAXIL) 40 MG tablet TAKE 1 TABLET BY MOUTH EVERY DAY 90 tablet 3  . phenytoin (DILANTIN) 100 MG ER capsule TAKE 100 MG EVERY MORNING AND 200 MG EVERY NIGHT. 270 capsule 3  . promethazine (PHENERGAN) 25 MG tablet Take 1 tablet (25 mg total) by mouth every 8 (eight) hours as needed for nausea or vomiting. 90 tablet 1  . Respiratory Therapy Supplies (FLUTTER) DEVI As directed 1 each 0  . rosuvastatin (CRESTOR) 40 MG tablet Take 1 tablet (40 mg total) by mouth daily. Discontinue 20 mg dose 90 tablet 3  . SUMAtriptan (IMITREX) 100 MG tablet TAKE ONE TABLET BY MOUTH EVERY 2 HOURS AS NEEDED FOR  MIGRAINE  OR  HEADACHE.  MAY  REPEAT  IN  2  HOURS  IF  HEADACHE  PERSISTS  OR  RECUR 9 tablet 2  .  traZODone (DESYREL) 100 MG tablet TAKE 1 TABLET BY MOUTH EVERYDAY AT BEDTIME 90 tablet 3  . Vitamin D, Ergocalciferol, (DRISDOL) 50000 units CAPS capsule TAKE ONE CAPSULE BY MOUTH ONCE A WEEK 12 capsule 2  . Vitamin D, Ergocalciferol, (DRISDOL) 50000 units CAPS capsule TAKE ONE CAPSULE BY MOUTH ONE TIME PER WEEK 12 capsule 3   No facility-administered medications prior to visit.     ROS: Review of Systems  Constitutional: Positive for fatigue. Negative for activity change, appetite change, chills and unexpected weight change.  HENT: Negative for congestion, mouth sores and sinus pressure.   Eyes: Negative for visual disturbance.  Respiratory: Positive for cough and wheezing. Negative for chest tightness.   Gastrointestinal: Positive for constipation. Negative for abdominal pain and nausea.    Genitourinary: Positive for frequency, urgency and vaginal discharge. Negative for difficulty urinating and vaginal pain.  Musculoskeletal: Positive for arthralgias, back pain, gait problem and neck pain.  Skin: Negative for pallor and rash.  Neurological: Negative for dizziness, tremors, weakness, numbness and headaches.  Psychiatric/Behavioral: Positive for decreased concentration, dysphoric mood and sleep disturbance. Negative for behavioral problems, confusion and suicidal ideas. The patient is nervous/anxious.      Objective:  BP 128/62 (BP Location: Left Arm, Patient Position: Sitting, Cuff Size: Normal)   Pulse 82   Temp 98.4 F (36.9 C) (Oral)   Ht 5\' 5"  (1.651 m)   Wt 149 lb (67.6 kg)   SpO2 93%   BMI 24.79 kg/m   BP Readings from Last 3 Encounters:  06/12/18 128/62  03/13/18 (!) 114/50  02/28/18 (!) 124/52    Wt Readings from Last 3 Encounters:  06/12/18 149 lb (67.6 kg)  03/13/18 151 lb (68.5 kg)  02/28/18 149 lb (67.6 kg)    Physical Exam Constitutional:      General: She is not in acute distress.    Appearance: She is well-developed.  HENT:     Head: Normocephalic.     Right Ear: External ear normal.     Left Ear: External ear normal.     Nose: Nose normal.  Eyes:     General:        Right eye: No discharge.        Left eye: No discharge.     Conjunctiva/sclera: Conjunctivae normal.     Pupils: Pupils are equal, round, and reactive to light.  Neck:     Musculoskeletal: Normal range of motion and neck supple.     Thyroid: No thyromegaly.     Vascular: No JVD.     Trachea: No tracheal deviation.  Cardiovascular:     Rate and Rhythm: Normal rate and regular rhythm.     Heart sounds: Normal heart sounds.  Pulmonary:     Effort: No respiratory distress.     Breath sounds: No stridor. No wheezing.  Abdominal:     General: Bowel sounds are normal. There is no distension.     Palpations: Abdomen is soft. There is no mass.     Tenderness: There is no  abdominal tenderness. There is no guarding or rebound.  Musculoskeletal:        General: No tenderness.  Lymphadenopathy:     Cervical: No cervical adenopathy.  Skin:    Findings: No erythema or rash.  Neurological:     Cranial Nerves: No cranial nerve deficit.     Motor: No abnormal muscle tone.     Coordination: Coordination normal.     Deep Tendon Reflexes: Reflexes normal.  Psychiatric:        Behavior: Behavior normal.        Thought Content: Thought content normal.        Judgment: Judgment normal.    In a w/c LS, neck - tender Wart R ear 3 mm Coughing some   Procedure Note :     Procedure : Cryosurgery   Indication:  Wart(s)   Risks including unsuccessful procedure , bleeding, infection, bruising, scar, a need for a repeat  procedure and others were explained to the patient in detail as well as the benefits. Informed consent was obtained verbally.   1  lesion(s)  on  R ear  was/were treated with liquid nitrogen on a Q-tip in a usual fasion . Band-Aid was applied and antibiotic ointment was given for a later use.   Tolerated well. Complications none.   Postprocedure instructions :     Keep the wounds clean. You can wash them with liquid soap and water. Pat dry with gauze or a Kleenex tissue  Before applying antibiotic ointment and a Band-Aid.   You need to report immediately  if  any signs of infection develop.     Lab Results  Component Value Date   WBC 6.8 02/28/2018   HGB 12.9 02/28/2018   HCT 38.1 02/28/2018   PLT 263.0 02/28/2018   GLUCOSE 95 02/28/2018   CHOL 228 (H) 03/19/2018   TRIG 191 (H) 03/19/2018   HDL 48 03/19/2018   LDLDIRECT 187.0 07/14/2015   LDLCALC 142 (H) 03/19/2018   ALT 21 03/19/2018   AST 18 03/19/2018   NA 139 02/28/2018   K 4.8 02/28/2018   CL 104 02/28/2018   CREATININE 1.03 02/28/2018   BUN 11 02/28/2018   CO2 33 (H) 02/28/2018   TSH 0.46 07/14/2015   INR 1.0 09/16/2008   HGBA1C  09/17/2008    5.0 (NOTE)   The ADA  recommends the following therapeutic goal for glycemic   control related to Hgb A1C measurement:   Goal of Therapy:   < 7.0% Hgb A1C   Reference: American Diabetes Association: Clinical Practice   Recommendations 2008, Diabetes Care,  2008, 31:(Suppl 1).    Ct Abdomen Pelvis W Contrast  Result Date: 02/28/2018 CLINICAL DATA:  LEFT upper quadrant pain, nausea and vomiting for 4 days. History of lung cancer, brain tumor, cholecystectomy, appendectomy, hysterectomy. EXAM: CT ABDOMEN AND PELVIS WITH CONTRAST TECHNIQUE: Multidetector CT imaging of the abdomen and pelvis was performed using the standard protocol following bolus administration of intravenous contrast. CONTRAST:  139mL ISOVUE-300 IOPAMIDOL (ISOVUE-300) INJECTION 61% COMPARISON:  CT abdomen and pelvis May 21, 2012 FINDINGS: LOWER CHEST: Lung bases are clear. Included heart size is normal. No pericardial effusion. HEPATOBILIARY: Status post cholecystectomy. Subcentimeter hypodensity segment 4 of the liver, probable cyst. PANCREAS: Normal. SPLEEN: Normal. ADRENALS/URINARY TRACT: Kidneys are orthotopic, demonstrating symmetric enhancement. No nephrolithiasis, hydronephrosis or solid renal masses. Bilateral homogeneously hypodense benign-appearing cysts measuring to 18 mm in addition to too small to characterize hypodensities bilateral kidneys. The unopacified ureters are normal in course and caliber. Delayed imaging through the kidneys demonstrates symmetric prompt contrast excretion within the proximal urinary collecting system. Urinary bladder is partially distended and unremarkable. Normal adrenal glands. STOMACH/BOWEL: The stomach, small and large bowel are normal in course and caliber without inflammatory changes. Moderate amount of retained large bowel stool. VASCULAR/LYMPHATIC: Aortoiliac vessels are normal in course and caliber. Mild-to-moderate calcific atherosclerosis and intimal thickening. No lymphadenopathy by CT size criteria.  REPRODUCTIVE: Status post  hysterectomy. OTHER: No intraperitoneal free fluid or free air. MUSCULOSKELETAL: Nonacute. Mild fat stranding anterior to LEFT piriformis muscle on axial images confirmed as artifact on reformations. Similar focal sclerosis RIGHT femoral head concerning for AVN without collapse. Osteopenia. Streak artifact from LEFT hip arthroplasty. LEFT obturator muscle atrophy. Old bilateral rib fractures. Subacute LEFT anterior seventh rib fracture. Gluteal injection granulomas. Anterior abdominal wall scarring. IMPRESSION: Moderate amount of retained large bowel stool without bowel obstruction or acute intra-abdominal/pelvic process. Aortic Atherosclerosis (ICD10-I70.0). Electronically Signed   By: Elon Alas M.D.   On: 02/28/2018 16:47    Assessment & Plan:   There are no diagnoses linked to this encounter.   No orders of the defined types were placed in this encounter.    Follow-up: No follow-ups on file.  Walker Kehr, MD

## 2018-06-12 NOTE — Assessment & Plan Note (Signed)
Chronic Norco  Potential benefits of a long term opioids use as well as potential risks (i.e. addiction risk, apnea etc) and complications (i.e. Somnolence, constipation and others) were explained to the patient and were aknowledged.

## 2018-06-12 NOTE — Assessment & Plan Note (Signed)
Pt wants to stop smoking. 1/3 PPD per pt She will try hypnosis.

## 2018-06-12 NOTE — Assessment & Plan Note (Signed)
Vit D 

## 2018-06-13 ENCOUNTER — Ambulatory Visit (HOSPITAL_COMMUNITY): Payer: Medicare HMO | Attending: Cardiology

## 2018-06-13 ENCOUNTER — Other Ambulatory Visit: Payer: Self-pay

## 2018-06-13 DIAGNOSIS — J438 Other emphysema: Secondary | ICD-10-CM | POA: Diagnosis not present

## 2018-06-13 DIAGNOSIS — I214 Non-ST elevation (NSTEMI) myocardial infarction: Secondary | ICD-10-CM | POA: Diagnosis not present

## 2018-06-13 DIAGNOSIS — R079 Chest pain, unspecified: Secondary | ICD-10-CM | POA: Insufficient documentation

## 2018-06-13 DIAGNOSIS — I351 Nonrheumatic aortic (valve) insufficiency: Secondary | ICD-10-CM | POA: Insufficient documentation

## 2018-06-13 DIAGNOSIS — Z720 Tobacco use: Secondary | ICD-10-CM | POA: Diagnosis not present

## 2018-06-14 NOTE — Progress Notes (Signed)
OK - I should be seeing her soon to discuss.  Thnx.  Conrad

## 2018-06-18 DIAGNOSIS — C3490 Malignant neoplasm of unspecified part of unspecified bronchus or lung: Secondary | ICD-10-CM | POA: Diagnosis not present

## 2018-06-28 ENCOUNTER — Ambulatory Visit: Payer: Self-pay

## 2018-06-28 ENCOUNTER — Other Ambulatory Visit: Payer: Self-pay | Admitting: Internal Medicine

## 2018-06-28 ENCOUNTER — Encounter (HOSPITAL_COMMUNITY)
Admission: EM | Disposition: A | Discharge status: Home / Self Care | Payer: Self-pay | Source: Home / Self Care | Attending: Cardiology

## 2018-06-28 ENCOUNTER — Other Ambulatory Visit: Payer: Self-pay | Admitting: Cardiology

## 2018-06-28 ENCOUNTER — Encounter (HOSPITAL_COMMUNITY): Payer: Self-pay | Admitting: *Deleted

## 2018-06-28 ENCOUNTER — Emergency Department (HOSPITAL_COMMUNITY): Payer: Medicare HMO

## 2018-06-28 ENCOUNTER — Inpatient Hospital Stay (HOSPITAL_COMMUNITY)
Admission: EM | Admit: 2018-06-28 | Discharge: 2018-06-29 | DRG: 247 | Disposition: A | Discharge status: Home / Self Care | Payer: Medicare HMO | Attending: Cardiology | Admitting: Cardiology

## 2018-06-28 DIAGNOSIS — Z880 Allergy status to penicillin: Secondary | ICD-10-CM

## 2018-06-28 DIAGNOSIS — F17210 Nicotine dependence, cigarettes, uncomplicated: Secondary | ICD-10-CM | POA: Diagnosis present

## 2018-06-28 DIAGNOSIS — Z9981 Dependence on supplemental oxygen: Secondary | ICD-10-CM | POA: Diagnosis not present

## 2018-06-28 DIAGNOSIS — I4510 Unspecified right bundle-branch block: Secondary | ICD-10-CM | POA: Diagnosis present

## 2018-06-28 DIAGNOSIS — F411 Generalized anxiety disorder: Secondary | ICD-10-CM | POA: Diagnosis present

## 2018-06-28 DIAGNOSIS — R51 Headache: Secondary | ICD-10-CM | POA: Diagnosis present

## 2018-06-28 DIAGNOSIS — Z8673 Personal history of transient ischemic attack (TIA), and cerebral infarction without residual deficits: Secondary | ICD-10-CM | POA: Diagnosis not present

## 2018-06-28 DIAGNOSIS — Z720 Tobacco use: Secondary | ICD-10-CM | POA: Diagnosis not present

## 2018-06-28 DIAGNOSIS — J439 Emphysema, unspecified: Secondary | ICD-10-CM

## 2018-06-28 DIAGNOSIS — S0990XA Unspecified injury of head, initial encounter: Secondary | ICD-10-CM | POA: Diagnosis not present

## 2018-06-28 DIAGNOSIS — Z993 Dependence on wheelchair: Secondary | ICD-10-CM | POA: Diagnosis not present

## 2018-06-28 DIAGNOSIS — I214 Non-ST elevation (NSTEMI) myocardial infarction: Secondary | ICD-10-CM | POA: Diagnosis present

## 2018-06-28 DIAGNOSIS — J438 Other emphysema: Secondary | ICD-10-CM | POA: Diagnosis not present

## 2018-06-28 DIAGNOSIS — E559 Vitamin D deficiency, unspecified: Secondary | ICD-10-CM | POA: Diagnosis present

## 2018-06-28 DIAGNOSIS — F329 Major depressive disorder, single episode, unspecified: Secondary | ICD-10-CM | POA: Diagnosis present

## 2018-06-28 DIAGNOSIS — E039 Hypothyroidism, unspecified: Secondary | ICD-10-CM | POA: Diagnosis present

## 2018-06-28 DIAGNOSIS — Z8249 Family history of ischemic heart disease and other diseases of the circulatory system: Secondary | ICD-10-CM

## 2018-06-28 DIAGNOSIS — I25110 Atherosclerotic heart disease of native coronary artery with unstable angina pectoris: Secondary | ICD-10-CM | POA: Diagnosis present

## 2018-06-28 DIAGNOSIS — Z888 Allergy status to other drugs, medicaments and biological substances status: Secondary | ICD-10-CM

## 2018-06-28 DIAGNOSIS — R079 Chest pain, unspecified: Secondary | ICD-10-CM | POA: Diagnosis not present

## 2018-06-28 DIAGNOSIS — I2510 Atherosclerotic heart disease of native coronary artery without angina pectoris: Secondary | ICD-10-CM | POA: Diagnosis not present

## 2018-06-28 DIAGNOSIS — E785 Hyperlipidemia, unspecified: Secondary | ICD-10-CM | POA: Diagnosis present

## 2018-06-28 DIAGNOSIS — E7800 Pure hypercholesterolemia, unspecified: Secondary | ICD-10-CM | POA: Diagnosis not present

## 2018-06-28 DIAGNOSIS — K219 Gastro-esophageal reflux disease without esophagitis: Secondary | ICD-10-CM | POA: Diagnosis present

## 2018-06-28 DIAGNOSIS — Z96642 Presence of left artificial hip joint: Secondary | ICD-10-CM | POA: Diagnosis present

## 2018-06-28 DIAGNOSIS — G40909 Epilepsy, unspecified, not intractable, without status epilepticus: Secondary | ICD-10-CM | POA: Diagnosis not present

## 2018-06-28 DIAGNOSIS — S59911A Unspecified injury of right forearm, initial encounter: Secondary | ICD-10-CM | POA: Diagnosis not present

## 2018-06-28 DIAGNOSIS — M160 Bilateral primary osteoarthritis of hip: Secondary | ICD-10-CM | POA: Diagnosis present

## 2018-06-28 DIAGNOSIS — Z79899 Other long term (current) drug therapy: Secondary | ICD-10-CM | POA: Diagnosis not present

## 2018-06-28 DIAGNOSIS — Z7951 Long term (current) use of inhaled steroids: Secondary | ICD-10-CM

## 2018-06-28 DIAGNOSIS — M170 Bilateral primary osteoarthritis of knee: Secondary | ICD-10-CM | POA: Diagnosis present

## 2018-06-28 DIAGNOSIS — Z833 Family history of diabetes mellitus: Secondary | ICD-10-CM

## 2018-06-28 DIAGNOSIS — I351 Nonrheumatic aortic (valve) insufficiency: Secondary | ICD-10-CM | POA: Diagnosis not present

## 2018-06-28 DIAGNOSIS — Z7982 Long term (current) use of aspirin: Secondary | ICD-10-CM | POA: Diagnosis not present

## 2018-06-28 DIAGNOSIS — Z79890 Hormone replacement therapy (postmenopausal): Secondary | ICD-10-CM | POA: Diagnosis not present

## 2018-06-28 DIAGNOSIS — J449 Chronic obstructive pulmonary disease, unspecified: Secondary | ICD-10-CM | POA: Diagnosis not present

## 2018-06-28 DIAGNOSIS — Z885 Allergy status to narcotic agent status: Secondary | ICD-10-CM

## 2018-06-28 DIAGNOSIS — R69 Illness, unspecified: Secondary | ICD-10-CM | POA: Diagnosis not present

## 2018-06-28 DIAGNOSIS — I2583 Coronary atherosclerosis due to lipid rich plaque: Secondary | ICD-10-CM

## 2018-06-28 DIAGNOSIS — Z85118 Personal history of other malignant neoplasm of bronchus and lung: Secondary | ICD-10-CM

## 2018-06-28 DIAGNOSIS — Z8051 Family history of malignant neoplasm of kidney: Secondary | ICD-10-CM

## 2018-06-28 HISTORY — PX: CORONARY STENT INTERVENTION: CATH118234

## 2018-06-28 HISTORY — PX: LEFT HEART CATH AND CORONARY ANGIOGRAPHY: CATH118249

## 2018-06-28 LAB — BASIC METABOLIC PANEL
Anion gap: 8 (ref 5–15)
BUN: 12 mg/dL (ref 8–23)
CO2: 28 mmol/L (ref 22–32)
Calcium: 9.2 mg/dL (ref 8.9–10.3)
Chloride: 106 mmol/L (ref 98–111)
Creatinine, Ser: 0.88 mg/dL (ref 0.44–1.00)
GFR calc Af Amer: >60 mL/min (ref >60)
GFR calc non Af Amer: >60 mL/min (ref >60)
Glucose, Bld: 92 mg/dL (ref 70–99)
Potassium: 4.2 mmol/L (ref 3.5–5.1)
Sodium: 142 mmol/L (ref 135–145)

## 2018-06-28 LAB — I-STAT TROPONIN, ED: Troponin i, poc: 0.28 ng/mL (ref 0.00–0.08)

## 2018-06-28 LAB — CBC
HCT: 41.5 % (ref 36.0–46.0)
Hemoglobin: 13 g/dL (ref 12.0–15.0)
MCH: 32.3 pg (ref 26.0–34.0)
MCHC: 31.3 g/dL (ref 30.0–36.0)
MCV: 103 fL — ABNORMAL HIGH (ref 80.0–100.0)
Platelets: 237 10*3/uL (ref 150–400)
RBC: 4.03 MIL/uL (ref 3.87–5.11)
RDW: 13.1 % (ref 11.5–15.5)
WBC: 8.3 10*3/uL (ref 4.0–10.5)
nRBC: 0 % (ref 0.0–0.2)

## 2018-06-28 LAB — TROPONIN I: Troponin I: 0.23 ng/mL (ref <0.03)

## 2018-06-28 LAB — POCT ACTIVATED CLOTTING TIME
Activated Clotting Time: 252 seconds
Activated Clotting Time: 274 seconds

## 2018-06-28 SURGERY — LEFT HEART CATH AND CORONARY ANGIOGRAPHY
Anesthesia: LOCAL

## 2018-06-28 MED ORDER — ACETAMINOPHEN 325 MG PO TABS
650.0000 mg | ORAL_TABLET | ORAL | Status: DC | PRN
  Administered 2018-06-28 – 2018-06-29 (×2): 650 mg via ORAL
  Filled 2018-06-28 (×2): qty 2

## 2018-06-28 MED ORDER — ASPIRIN EC 81 MG PO TBEC
81.0000 mg | DELAYED_RELEASE_TABLET | Freq: Every day | ORAL | Status: DC
  Administered 2018-06-29: 81 mg via ORAL
  Filled 2018-06-28: qty 1

## 2018-06-28 MED ORDER — SODIUM CHLORIDE 0.9 % IV SOLN
INTRAVENOUS | Status: AC | PRN
  Administered 2018-06-28: 20 mL/h via INTRAVENOUS

## 2018-06-28 MED ORDER — ANGIOPLASTY BOOK
Freq: Once | Status: AC
  Administered 2018-06-29: 07:00:00
  Filled 2018-06-28: qty 1

## 2018-06-28 MED ORDER — HEPARIN BOLUS VIA INFUSION
4000.0000 [IU] | Freq: Once | INTRAVENOUS | Status: DC
  Filled 2018-06-28: qty 4000

## 2018-06-28 MED ORDER — VERAPAMIL HCL 2.5 MG/ML IV SOLN
INTRAVENOUS | Status: DC | PRN
  Administered 2018-06-28: 10 mL via INTRA_ARTERIAL

## 2018-06-28 MED ORDER — HEPARIN (PORCINE) IN NACL 2000-0.9 UNIT/L-% IV SOLN
INTRAVENOUS | Status: AC
  Filled 2018-06-28: qty 2000

## 2018-06-28 MED ORDER — SODIUM CHLORIDE 0.9% FLUSH: 3.0000 mL | INTRAVENOUS | Status: DC | PRN

## 2018-06-28 MED ORDER — ASPIRIN 81 MG PO CHEW
324.0000 mg | CHEWABLE_TABLET | Freq: Once | ORAL | Status: AC
  Administered 2018-06-28: 324 mg via ORAL
  Filled 2018-06-28: qty 4

## 2018-06-28 MED ORDER — HEART ATTACK BOUNCING BOOK
Freq: Once | Status: AC
  Administered 2018-06-29: 07:00:00
  Filled 2018-06-28: qty 1

## 2018-06-28 MED ORDER — CLOPIDOGREL BISULFATE 75 MG PO TABS
75.0000 mg | ORAL_TABLET | Freq: Every day | ORAL | Status: DC
  Administered 2018-06-29: 75 mg via ORAL
  Filled 2018-06-28: qty 1

## 2018-06-28 MED ORDER — HEPARIN SODIUM (PORCINE) 1000 UNIT/ML IJ SOLN
INTRAMUSCULAR | Status: DC | PRN
  Administered 2018-06-28: 2000 [IU] via INTRAVENOUS
  Administered 2018-06-28: 3000 [IU] via INTRAVENOUS
  Administered 2018-06-28: 3500 [IU] via INTRAVENOUS

## 2018-06-28 MED ORDER — MOMETASONE FURO-FORMOTEROL FUM 200-5 MCG/ACT IN AERO
2.0000 | INHALATION_SPRAY | Freq: Two times a day (BID) | RESPIRATORY_TRACT | Status: DC
  Administered 2018-06-29: 10:00:00 2 via RESPIRATORY_TRACT
  Filled 2018-06-28: qty 8.8

## 2018-06-28 MED ORDER — SODIUM CHLORIDE 0.9% FLUSH
3.0000 mL | Freq: Two times a day (BID) | INTRAVENOUS | Status: DC
  Administered 2018-06-29: 3 mL via INTRAVENOUS

## 2018-06-28 MED ORDER — ONDANSETRON HCL 4 MG/2ML IJ SOLN: 4.0000 mg | Freq: Four times a day (QID) | INTRAMUSCULAR | Status: DC | PRN

## 2018-06-28 MED ORDER — MIDAZOLAM HCL 2 MG/2ML IJ SOLN
INTRAMUSCULAR | Status: DC | PRN
  Administered 2018-06-28: 1 mg via INTRAVENOUS

## 2018-06-28 MED ORDER — SODIUM CHLORIDE 0.9 % WEIGHT BASED INFUSION
1.0000 mL/kg/h | INTRAVENOUS | Status: AC
  Administered 2018-06-28: 1 mL/kg/h via INTRAVENOUS

## 2018-06-28 MED ORDER — HEPARIN SODIUM (PORCINE) 5000 UNIT/ML IJ SOLN
5000.0000 [IU] | Freq: Three times a day (TID) | INTRAMUSCULAR | Status: DC
  Administered 2018-06-29: 07:00:00 5000 [IU] via SUBCUTANEOUS
  Filled 2018-06-28: qty 1

## 2018-06-28 MED ORDER — NITROGLYCERIN 0.4 MG SL SUBL
0.4000 mg | SUBLINGUAL_TABLET | SUBLINGUAL | Status: DC | PRN
  Administered 2018-06-28: 0.4 mg via SUBLINGUAL
  Filled 2018-06-28: qty 1

## 2018-06-28 MED ORDER — HYDROCODONE-ACETAMINOPHEN 10-325 MG PO TABS
1.0000 | ORAL_TABLET | Freq: Four times a day (QID) | ORAL | Status: DC | PRN
  Administered 2018-06-28 – 2018-06-29 (×2): 1 via ORAL
  Filled 2018-06-28 (×2): qty 1

## 2018-06-28 MED ORDER — PHENYTOIN SODIUM EXTENDED 100 MG PO CAPS
200.0000 mg | ORAL_CAPSULE | Freq: Every day | ORAL | Status: DC
  Administered 2018-06-28: 21:00:00 200 mg via ORAL
  Filled 2018-06-28: qty 2

## 2018-06-28 MED ORDER — FENTANYL CITRATE (PF) 100 MCG/2ML IJ SOLN
INTRAMUSCULAR | Status: AC
  Filled 2018-06-28: qty 2

## 2018-06-28 MED ORDER — HEPARIN (PORCINE) 25000 UT/250ML-% IV SOLN: 800.0000 [IU]/h | INTRAVENOUS | Status: DC

## 2018-06-28 MED ORDER — FENTANYL CITRATE (PF) 100 MCG/2ML IJ SOLN
INTRAMUSCULAR | Status: DC | PRN
  Administered 2018-06-28: 25 ug via INTRAVENOUS

## 2018-06-28 MED ORDER — MIDAZOLAM HCL 2 MG/2ML IJ SOLN
INTRAMUSCULAR | Status: AC
  Filled 2018-06-28: qty 2

## 2018-06-28 MED ORDER — HEPARIN SODIUM (PORCINE) 1000 UNIT/ML IJ SOLN
INTRAMUSCULAR | Status: AC
  Filled 2018-06-28: qty 1

## 2018-06-28 MED ORDER — THE SENSUOUS HEART BOOK
Freq: Once | Status: AC
  Administered 2018-06-29: 07:00:00
  Filled 2018-06-28: qty 1

## 2018-06-28 MED ORDER — CLOPIDOGREL BISULFATE 300 MG PO TABS
ORAL_TABLET | ORAL | Status: DC | PRN
  Administered 2018-06-28: 600 mg via ORAL

## 2018-06-28 MED ORDER — VERAPAMIL HCL 2.5 MG/ML IV SOLN
INTRAVENOUS | Status: AC
  Filled 2018-06-28: qty 2

## 2018-06-28 MED ORDER — PAROXETINE HCL 20 MG PO TABS
40.0000 mg | ORAL_TABLET | Freq: Every day | ORAL | Status: DC
  Administered 2018-06-29: 40 mg via ORAL
  Filled 2018-06-28: qty 2

## 2018-06-28 MED ORDER — ALPRAZOLAM 0.25 MG PO TABS
0.2500 mg | ORAL_TABLET | Freq: Every evening | ORAL | Status: DC | PRN
  Administered 2018-06-28: 22:00:00 0.25 mg via ORAL
  Filled 2018-06-28: qty 1

## 2018-06-28 MED ORDER — LIDOCAINE HCL (PF) 1 % IJ SOLN
INTRAMUSCULAR | Status: DC | PRN
  Administered 2018-06-28: 2 mL via SUBCUTANEOUS

## 2018-06-28 MED ORDER — SODIUM CHLORIDE 0.9 % IV SOLN: 250.0000 mL | INTRAVENOUS | Status: DC | PRN

## 2018-06-28 MED ORDER — HEPARIN (PORCINE) IN NACL 1000-0.9 UT/500ML-% IV SOLN
INTRAVENOUS | Status: DC | PRN
  Administered 2018-06-28 (×2): 500 mL

## 2018-06-28 MED ORDER — CLOPIDOGREL BISULFATE 300 MG PO TABS
ORAL_TABLET | ORAL | Status: AC
  Filled 2018-06-28: qty 2

## 2018-06-28 MED ORDER — LEVOTHYROXINE SODIUM 75 MCG PO TABS
150.0000 ug | ORAL_TABLET | Freq: Every day | ORAL | Status: DC
  Administered 2018-06-29: 07:00:00 150 ug via ORAL
  Filled 2018-06-28: qty 2

## 2018-06-28 MED ORDER — IOHEXOL 350 MG/ML SOLN
INTRAVENOUS | Status: DC | PRN
  Administered 2018-06-28: 90 mL via INTRA_ARTERIAL

## 2018-06-28 MED ORDER — LIDOCAINE HCL (PF) 1 % IJ SOLN
INTRAMUSCULAR | Status: AC
  Filled 2018-06-28: qty 30

## 2018-06-28 MED ORDER — LABETALOL HCL 5 MG/ML IV SOLN: 10.0000 mg | INTRAVENOUS | Status: AC | PRN

## 2018-06-28 MED ORDER — PHENYTOIN SODIUM EXTENDED 100 MG PO CAPS
100.0000 mg | ORAL_CAPSULE | Freq: Every day | ORAL | Status: DC
  Administered 2018-06-29: 100 mg via ORAL
  Filled 2018-06-28: qty 1

## 2018-06-28 MED ORDER — NITROGLYCERIN 1 MG/10 ML FOR IR/CATH LAB
INTRA_ARTERIAL | Status: AC
  Filled 2018-06-28: qty 10

## 2018-06-28 MED ORDER — ROSUVASTATIN CALCIUM 20 MG PO TABS
40.0000 mg | ORAL_TABLET | Freq: Every day | ORAL | Status: DC
  Administered 2018-06-28 – 2018-06-29 (×2): 40 mg via ORAL
  Filled 2018-06-28 (×2): qty 2

## 2018-06-28 MED ORDER — HYDRALAZINE HCL 20 MG/ML IJ SOLN: 5.0000 mg | INTRAMUSCULAR | Status: AC | PRN

## 2018-06-28 MED ORDER — TRAZODONE HCL 50 MG PO TABS
100.0000 mg | ORAL_TABLET | Freq: Every day | ORAL | Status: DC
  Administered 2018-06-28: 100 mg via ORAL
  Filled 2018-06-28: qty 2

## 2018-06-28 MED ORDER — NITROGLYCERIN 1 MG/10 ML FOR IR/CATH LAB
INTRA_ARTERIAL | Status: DC | PRN
  Administered 2018-06-28: 200 ug via INTRACORONARY

## 2018-06-28 MED ORDER — ALBUTEROL SULFATE (2.5 MG/3ML) 0.083% IN NEBU: 3.0000 mL | INHALATION_SOLUTION | Freq: Four times a day (QID) | RESPIRATORY_TRACT | Status: DC | PRN

## 2018-06-28 SURGICAL SUPPLY — 21 items
BALLN SAPPHIRE 2.5X12 (BALLOONS) ×2
BALLN ~~LOC~~ EMERGE MR 3.75X12 (BALLOONS) ×2
BALLOON SAPPHIRE 2.5X12 (BALLOONS) ×1 IMPLANT
BALLOON ~~LOC~~ EMERGE MR 3.75X12 (BALLOONS) ×1 IMPLANT
CATH INFINITI 5 FR JL3.5 (CATHETERS) ×2 IMPLANT
CATH INFINITI 5FR ANG PIGTAIL (CATHETERS) ×2 IMPLANT
CATH INFINITI JR4 5F (CATHETERS) ×2 IMPLANT
CATH VISTA GUIDE 6FR JR4 (CATHETERS) ×2 IMPLANT
DEVICE RAD COMP TR BAND LRG (VASCULAR PRODUCTS) ×2 IMPLANT
ELECT DEFIB PAD ADLT CADENCE (PAD) ×2 IMPLANT
GLIDESHEATH SLEND SS 6F .021 (SHEATH) ×2 IMPLANT
GUIDEWIRE INQWIRE 1.5J.035X260 (WIRE) ×1 IMPLANT
INQWIRE 1.5J .035X260CM (WIRE) ×2
KIT ENCORE 26 ADVANTAGE (KITS) ×2 IMPLANT
KIT HEART LEFT (KITS) ×2 IMPLANT
PACK CARDIAC CATHETERIZATION (CUSTOM PROCEDURE TRAY) ×2 IMPLANT
SHEATH PROBE COVER 6X72 (BAG) ×2 IMPLANT
STENT SYNERGY DES 3.5X16 (Permanent Stent) ×2 IMPLANT
TRANSDUCER W/STOPCOCK (MISCELLANEOUS) ×2 IMPLANT
TUBING CIL FLEX 10 FLL-RA (TUBING) ×2 IMPLANT
WIRE SION BLUE 180 (WIRE) ×2 IMPLANT

## 2018-06-28 NOTE — Progress Notes (Signed)
ANTICOAGULATION CONSULT NOTE - Initial Consult  Pharmacy Consult for Heparin Indication: chest pain/ACS  Allergies  Allergen Reactions  . Morphine     "Makes me go crazy."   . Nitrofuran Derivatives     ??confusion  . Oxycodone Hcl Other (See Comments)    Knocked her out for 6 days  . Penicillins Hives    Has patient had a PCN reaction causing immediate rash, facial/tongue/throat swelling, SOB or lightheadedness with hypotension: unknown Has patient had a PCN reaction causing severe rash involving mucus membranes or skin necrosis: unknown Has patient had a PCN reaction that required hospitalization No Has patient had a PCN reaction occurring within the last 10 years: No If all of the above answers are "NO", then may proceed with Cephalosporin use.   . Tape Other (See Comments)    Skin turns red and burns    Patient Measurements: Weight: 150 lb (68 kg)  Vital Signs: Temp: 98 F (36.7 C) (01/03 1103) Temp Source: Oral (01/03 1103) BP: 125/43 (01/03 1445) Pulse Rate: 74 (01/03 1445)  Labs: Recent Labs    06/28/18 1156  HGB 13.0  HCT 41.5  PLT 237  CREATININE 0.88    Estimated Creatinine Clearance: 58.1 mL/min (by C-G formula based on SCr of 0.88 mg/dL).   Medical History: Past Medical History:  Diagnosis Date  . Anxiety   . Aortic insufficiency with aortic stenosis 04/2017   Echo with severe aortic insufficiency with mild aortic stenosis.  . Collagenous colitis   . Colon adenomas 2011  . Constipation    Chronic abdominal pain and constipation  . COPD (chronic obstructive pulmonary disease) ([place])   . Depression   . Diverticulosis   . Esophageal stricture 11/08/2012  . Esophageal ulcer    04/2009 EGD  . GERD (gastroesophageal reflux disease)   . Helicobacter pylori gastritis 2010   Pylera Tx  . History of cholecystectomy   . Hx of appendectomy   . Hx of cancer of lung 1999  . Hx of hysterectomy   . Hyperlipidemia   . Hypothyroidism   . Low back pain    . Osteoarthritis of knee    bilateral knee  . Seizures ([place])   . Stroke [facility])    CVA, hx of 97  . 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 female reports shortness of breath frequently, last night was having dyspnea and pain over the center of the chest, felt like a "fat man sitting on my chest". Pharmacy consulted for heparin drip.  Goal of Therapy:  Heparin level 0.3-0.7 units/ml Monitor platelets by anticoagulation protocol: Yes   Plan:  Give 4000 units bolus x 1 Start heparin infusion at 800 units/hr Check anti-Xa level in 6 hours and daily while on heparin Continue to monitor H&H and platelets  Alanda Slim, PharmD, [facility] Clinical Pharmacist Please see AMION for all Pharmacists' Contact Phone Numbers 06/28/2018, 3:07 PM

## 2018-06-28 NOTE — Telephone Encounter (Signed)
Pt c/o chest pain describing it as tight and like "it feels like a fat man sitting on his chest." The chest pain began last night. Pt took 2 nitroglycerin and felt better and went to sleep. Pt awoke with chest pain she rates as moderate. Pt stated last night the pain was constant but is coming and going this morning. Pt c/o dizziness, h/o migraine Still photophobic), nausea and is very short of breath. Pt has a h/o overweight, smoker, and has a strong family h/o heart disease. Care advice given and pt verbalized understanding. Pt advised to go to [facility] ED. Husband will drive her.   Reason for Disposition . Pain also present in shoulder(s) or arm(s) or jaw  (Exception: pain is clearly made worse by movement)  Answer Assessment - Initial Assessment Questions 1. LOCATION: "Where does it hurt?"       Tight feels like a fat man sitting on chest 2. RADIATION: "Does the pain go anywhere else?" (e.g., into neck, jaw, arms, back)     Left arm 3. ONSET: "When did the chest pain begin?" (Minutes, hours or days)      Last night 9:00 pm 4. PATTERN "Does the pain come and go, or has it been constant since it started?"  "Does it get worse with exertion?"      Comes and goes this morning is constant took 2 NTG last night that heled 5. DURATION: "How long does it last" (e.g., seconds, minutes, hours)     5 minutes last night this contstant 6. SEVERITY: "How bad is the pain?"  (e.g., Scale 1-10; mild, moderate, or severe)    - MILD (1-3): doesn't interfere with normal activities     - MODERATE (4-7): interferes with normal activities or awakens from sleep    - SEVERE (8-10): excruciating pain, unable to do any normal activities       Moderate 7 7. CARDIAC RISK FACTORS: "Do you have any history of heart problems or risk factors for heart disease?" (e.g., prior heart attack, angina; high blood pressure, diabetes, being overweight, high cholesterol, smoking, or strong family history of heart disease)  Overweight, smoker, strong family h/o heart disease 8. PULMONARY RISK FACTORS: "Do you have any history of lung disease?"  (e.g., blood clots in lung, asthma, emphysema, birth control pills)     Lung cancer in 1999 9. CAUSE: "What do you think is causing the chest pain?"     Pt doesn't know 10. OTHER SYMPTOMS: "Do you have any other symptoms?" (e.g., dizziness, nausea, vomiting, sweating, fever, difficulty breathing, cough)       Dizziness, h/o migraine light hurts her eyes, nausea, SOB, cough 11. PREGNANCY: "Is there any chance you are pregnant?" "When was your last menstrual period?"       n/a  Protocols used: CHEST PAIN-A-AH

## 2018-06-28 NOTE — Telephone Encounter (Signed)
Pt at ED.

## 2018-06-28 NOTE — H&P (Signed)
Admit date: 06/28/2018 Referring Physician Dr. Billy Fischer Primary Cardiologist Dr. [name] Chief complaint/reason for admission:chest pain  HPI: Kaylee Hansen is a 63 y.o. female who is being seen today for the evaluation of chest pain at the request of Dr. Gareth Morgan.  This is a very pleasant 63 year old female who has a history of GERD with esophageal stricture and esophageal ulcer in 2010, depression, mild aortic stenosis and aortic insufficiency, lung CA, hyperlipidemia, remote CVA in 1997, ongoing tobacco abuse with severe COPD on home oxygen and is wheelchair-bound due to significant arthritic pain in her knees and hips.    She was seen last by Dr. Ellyn Hack on 12/24/2017. At that office visit she complained of off-and-on episodes of chest pain and palpitations but apparently she has problems with chronic chest pain.  She is also had problems with orthostatic hypotension felt due to sedentary state.  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  Dr. Ellyn Hack ordered a repeat echo in December which showed normal LV function with EF 60 to 65% with mild LV dilatation.  There is mild aortic valve stenosis and severe AI the pressure half-time is 327 ms.  The end-diastolic dimension was 55 mm and LV end-systolic dimension 34 mm.  He felt that she was likely not a surgical candidate due to her underlying lung disease and was going to discuss with structural heart team.   Rosanne Gutting CTA was done a year ago due to chest pain which showed a coronary artery artery calcium score of 2.9 but had calcified plaque that was concerning for primary soft plaque in the proximal RCA with moderate stenosis.  FFR of the RCA was 0.9 and not hemodynamically significant.  She was in her usual state of health with her typical chronic dyspnea.  She developed substernal chest pressure that was severe 10 out of 10 at night that felt like someone sitting on her chest.  She took 2 sublingual nitroglycerin and  felt better.  When she awakened this morning she felt okay but then the chest heaviness came back.  Subsequently developed radiation to the left arm with left arm numbness.  She presented to the emergency room where troponin was elevated at 0.28.  Creatinine was normal at 0.88.  She continues to complain of 7 out of 10 chest pain.  Of note her last LDL was not at goal at 142.  EKG shows normal sinus rhythm with new right bundle branch block.  Note the patient says she fell and hit her head last night and head CT was unremarkable today.  PMH:    Past Medical History:  Diagnosis Date  . Anxiety   . Aortic insufficiency with aortic stenosis 04/2017   Echo with severe aortic insufficiency with mild aortic stenosis.  . Collagenous colitis   . Colon adenomas 2011  . Constipation    Chronic abdominal pain and constipation  . COPD (chronic obstructive pulmonary disease) ([place])   . Depression   . Diverticulosis   . Esophageal stricture 11/08/2012  . Esophageal ulcer    04/2009 EGD  . GERD (gastroesophageal reflux disease)   . Helicobacter pylori gastritis 2010   Pylera Tx  . History of cholecystectomy   . Hx of appendectomy   . Hx of cancer of lung 1999  . Hx of hysterectomy   . Hyperlipidemia   . Hypothyroidism   . Low back pain   . Osteoarthritis of knee    bilateral knee  .  Seizures ([place])   . Stroke [facility])    CVA, hx of 97  . 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 ABDOMINAL HYSTERECTOMY     complete 1992  . ABDOMINAL SURGERY     Exploratory  . APPENDECTOMY    . BALLOON DILATION N/A 11/08/2012   Procedure: BALLOON DILATION;  Surgeon: Gatha Mayer, MD;  Location: [facility] ENDOSCOPY;  Service: Endoscopy;  Laterality: N/A;  . CHOLECYSTECTOMY    . COLONOSCOPY W/ BIOPSIES     multiple  . CT CTA CORONARY W/CA SCORE W/CM &/OR WO/CM  05/2017   Coronary calcium score 2.9. Intermediate risk. Unusual noncalcified plaque in RCA --FFR negative  .  ESOPHAGOGASTRODUODENOSCOPY     w/baloon x 2  . ESOPHAGOGASTRODUODENOSCOPY N/A 11/08/2012   Procedure: ESOPHAGOGASTRODUODENOSCOPY (EGD);  Surgeon: Gatha Mayer, MD;  Location: [facility];  Service: Endoscopy;  Laterality: N/A;  . TOTAL HIP ARTHROPLASTY     Left  . TRANSTHORACIC ECHOCARDIOGRAM  04/2017   EF 60-65%. GR 1 DD. Mild AS with SEVERE AI  . TRANSTHORACIC ECHOCARDIOGRAM  10/2017   Calcified aortic valve with mild stenosis and moderate to severe AI (visually appears severe, but holodiastolic flow reversal in the descending thoracic aorta not seen).  Mild to moderate MR.  EF 60 to 65% with GR 1 DD and no R WMA.    ALLERGIES:   Morphine; Nitrofuran derivatives; Oxycodone hcl; Penicillins; and Tape  Prior to Admit Meds:  (Not in a hospital admission)  Family HX:    Family History  Problem Relation Age of Onset  . Coronary artery disease Mother   . Diabetes Mother   . Hypertension Father   . Diabetes Son   . Coronary artery disease Other        grandmother, grandfather  . Kidney disease Other        aunt  . Kidney cancer Sister   . Colon cancer Neg Hx        colon   Social HX:    Social History   Socioeconomic History  . Marital status: Married    Spouse name: Not on file  . Number of children: 2  . Years of education: Not on file  . Highest education level: Not on file  Occupational History  . Occupation: disabled    Employer: DISABLED  Social Needs  . Financial resource strain: Not on file  . Food insecurity:    Worry: Not on file    Inability: Not on file  . Transportation needs:    Medical: Not on file    Non-medical: Not on file  Tobacco Use  . Smoking status: Current Every Day Smoker    Packs/day: 1.00    Years: 48.00    Pack years: 48.00    Types: Cigarettes  . Smokeless tobacco: Never Used  Substance and Sexual Activity  . Alcohol use: No  . Drug use: No  . Sexual activity: Not Currently  Lifestyle  . Physical activity:    Days per week: Not  on file    Minutes per session: Not on file  . Stress: Not on file  Relationships  . Social connections:    Talks on phone: Not on file    Gets together: Not on file    Attends religious service: Not on file    Active member of club or organization: Not on file    Attends meetings of clubs  or organizations: Not on file    Relationship status: Not on file  . Intimate partner violence:    Fear of current or ex partner: Not on file    Emotionally abused: Not on file    Physically abused: Not on file    Forced sexual activity: Not on file  Other Topics Concern  . Not on file  Social History Narrative   Married   2 children   No regular exercise           ROS:  All ROS were addressed and are negative except what is stated in the HPI  PHYSICAL EXAM Vitals:   06/28/18 1400 06/28/18 1445  BP: (!) 118/44 (!) 125/43  Pulse: 73 74  Resp: (!) 23 (!) 24  Temp:    SpO2: 96% 99%   General: Ill-appearing in mild distress due to chest pain Head: Eyes PERRLA, No xanthomas.   Normal cephalic and atramatic  Lungs:   Clear bilaterally to auscultation and percussion. Heart:   HRRR S1 S2 Pulses are 2+ & equal.            No carotid bruit. No JVD.  No abdominal bruits. No femoral bruits. Abdomen: Bowel sounds are positive, abdomen soft and non-tender without masses or                  Hernia's noted. Msk:  Back normal, normal gait. Normal strength and tone for age. Extremities:   No clubbing, cyanosis or edema.  DP +1 Neuro: Alert and oriented X 3. Psych:  Good affect, responds appropriately   Labs:   Lab Results  Component Value Date   WBC 8.3 06/28/2018   HGB 13.0 06/28/2018   HCT 41.5 06/28/2018   MCV 103.0 (H) 06/28/2018   PLT 237 06/28/2018    Recent Labs  Lab 06/28/18 1156  NA 142  K 4.2  CL 106  CO2 28  BUN 12  CREATININE 0.88  CALCIUM 9.2  GLUCOSE 92   Lab Results  Component Value Date   CKTOTAL 45 01/02/2014   CKMB 1.9 05/12/2009   TROPONINI <0.03  06/20/2017   No results found for: PTT Lab Results  Component Value Date   INR 1.0 09/16/2008     Lab Results  Component Value Date   CHOL 228 (H) 03/19/2018   CHOL 223 (H) 10/30/2017   CHOL 320 (H) 06/15/2017   Lab Results  Component Value Date   HDL 48 03/19/2018   HDL 49 10/30/2017   HDL 55 06/15/2017   Lab Results  Component Value Date   LDLCALC 142 (H) 03/19/2018   LDLCALC 136 (H) 10/30/2017   LDLCALC 232 (H) 06/15/2017   Lab Results  Component Value Date   TRIG 191 (H) 03/19/2018   TRIG 192 (H) 10/30/2017   TRIG 164 (H) 06/15/2017   Lab Results  Component Value Date   CHOLHDL 4.8 (H) 03/19/2018   CHOLHDL 4.6 (H) 10/30/2017   CHOLHDL 5.8 (H) 06/15/2017   Lab Results  Component Value Date   LDLDIRECT 187.0 07/14/2015   LDLDIRECT 232.7 08/12/2009   LDLDIRECT 232.8 09/24/2008      Radiology:  Dg Chest 2 View  Result Date: 06/28/2018 CLINICAL DATA:  63 year old female with chest pain today. EXAM: CHEST - 2 VIEW COMPARISON:  Coronary CTA 06/22/2017 and earlier. FINDINGS: Upright AP and lateral views. Mediastinal contours are stable with mild tortuosity of the ascending aorta. Stable lung volumes. No pneumothorax, pulmonary edema, pleural effusion or confluent  pulmonary opacity. Chronic bilateral rib fractures. Visualized tracheal air column is within normal limits. No acute osseous abnormality identified. Stable cholecystectomy clips. Negative visible bowel gas pattern. IMPRESSION: No acute cardiopulmonary abnormality. Electronically Signed   By: Genevie Ann M.D.   On: 06/28/2018 12:32   Ct Head Wo Contrast  Result Date: 06/28/2018 CLINICAL DATA:  CT head w/o contrast due to head traumaPt in c/o chest pain that started last night, took nitro and pain improved, symptoms returned this morning, reports shortness of breath and nausea EXAM: CT HEAD WITHOUT CONTRAST TECHNIQUE: Contiguous axial images were obtained from the base of the skull through the vertex without  intravenous contrast. COMPARISON:  02/25/2015 FINDINGS: Brain: No evidence of acute infarction, hemorrhage, hydrocephalus, extra-axial collection or mass lesion/mass effect. There is ventricular sulcal enlargement reflecting mild generalized atrophy. Minor periventricular white matter hypoattenuation is also noted consistent with chronic microvascular ischemic change. Vascular: No hyperdense vessel or unexpected calcification. Skull: No fracture. Sinuses/Orbits: Globes and orbits are unremarkable. Sinuses and mastoid air cells are clear. Other: None. IMPRESSION: 1. No acute intracranial abnormalities. 2. Mild atrophy and minor chronic microvascular ischemic change. Electronically Signed   By: Lajean Manes M.D.   On: 06/28/2018 14:10     Telemetry    Normal sinus rhythm- Personally Reviewed  ECG    Sinus rhythm with new right bundle branch block- Personally Reviewed   ASSESSMENT/PLAN:   1.  Unstable angina/possible NSTEMI -Patient has history of chronic chest pain but has been felt to be musculoskeletal. -Coronary CTA done a year ago showed soft plaque in the RCA with an FFR of greater than 0.9 -Troponin elevated at 0.28. -Patient continues to have chest pain with radiation to the left arm.  Given history of RCA CAD noted on coronary CTA a year ago with poorly controlled hyperlipidemia and ongoing tobacco abuse, I am concerned that she may have had progression of her RCA stenosis. -She has not eaten since last night and with an elevated troponin ongoing chest pain of recommend proceeding with cardiac catheterization. -Cardiac catheterization was discussed with the patient fully. The patient understands that risks include but are not limited to stroke (1 in 1000), death (1 in 61), kidney failure [usually temporary] (1 in 500), bleeding (1 in 200), allergic reaction [possibly serious] (1 in 200).  The patient understands and is willing to proceed.   -Start IV heparin drip per pharmacy -Continue  aspirin 81 mg daily, Imdur 30 mg daily and Crestor 40 mg daily -Echo FLP in a.m. -Avoid beta-blocker due to O2 dependent COPD  2.  Severe O2 dependent COPD -Continue home oxygen, Symbicort ER and albuterol inhalers.  3.  Severe aortic insufficiency with mild aortic stenosis -Repeat echo a few weeks ago showed mildly dilated LV with overall normal LV function -There was moderate aortic insufficiency by pressure half-time but visually looked severe -Dr. Allison Quarry note from the echo suggested CVTS consult but doubted that she was a surgical candidate due to her underlying lung disease and recommended also a structural heart team evaluation  Fransico Him, MD  06/28/2018  3:14 PM

## 2018-06-28 NOTE — ED Notes (Signed)
Patient transported to CT 

## 2018-06-28 NOTE — ED Notes (Addendum)
Pt transported to cath lab. Family member took pt's belongings and wheelchair to main waiting area.

## 2018-06-28 NOTE — Interval H&P Note (Signed)
History and Physical Interval Note:  06/28/2018 3:53 PM  Kaylee Hansen  has presented today for surgery, with the diagnosis of cp  The various methods of treatment have been discussed with the patient and family. After consideration of risks, benefits and other options for treatment, the patient has consented to  Procedure(s): LEFT HEART CATH AND CORONARY ANGIOGRAPHY (N/A) as a surgical intervention .  The patient's history has been reviewed, patient examined, no change in status, stable for surgery.  I have reviewed the patient's chart and labs.  Questions were answered to the patient's satisfaction.   Cath Lab Visit (complete for each Cath Lab visit)  Clinical Evaluation Leading to the Procedure:   ACS: Yes.    Non-ACS:    Anginal Classification: CCS IV  Anti-ischemic medical therapy: Minimal Therapy (1 class of medications)  Non-Invasive Test Results: No non-invasive testing performed  Prior CABG: No previous CABG        Collier [facility] 06/28/2018 3:53 PM

## 2018-06-28 NOTE — ED Triage Notes (Signed)
Pt in c/o chest pain that started last night, took nitro and pain improved, symptoms returned this morning, reports shortness of breath and nausea
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
## 2018-06-28 NOTE — ED Notes (Signed)
Pt returned from CT- pain improved after 1 nitro

## 2018-06-29 DIAGNOSIS — Z720 Tobacco use: Secondary | ICD-10-CM

## 2018-06-29 DIAGNOSIS — J438 Other emphysema: Secondary | ICD-10-CM

## 2018-06-29 LAB — HIV ANTIBODY (ROUTINE TESTING W REFLEX): HIV Screen 4th Generation wRfx: NONREACTIVE

## 2018-06-29 LAB — LIPID PANEL
Cholesterol: 128 mg/dL (ref 0–200)
HDL: 35 mg/dL — ABNORMAL LOW (ref >40)
LDL Cholesterol: 56 mg/dL (ref 0–99)
Total CHOL/HDL Ratio: 3.7 RATIO
Triglycerides: 184 mg/dL — ABNORMAL HIGH (ref <150)
VLDL: 37 mg/dL (ref 0–40)

## 2018-06-29 LAB — CBC
HCT: 35.1 % — ABNORMAL LOW (ref 36.0–46.0)
Hemoglobin: 11.1 g/dL — ABNORMAL LOW (ref 12.0–15.0)
MCH: 31.4 pg (ref 26.0–34.0)
MCHC: 31.6 g/dL (ref 30.0–36.0)
MCV: 99.4 fL (ref 80.0–100.0)
Platelets: 196 10*3/uL (ref 150–400)
RBC: 3.53 MIL/uL — ABNORMAL LOW (ref 3.87–5.11)
RDW: 13 % (ref 11.5–15.5)
WBC: 6 10*3/uL (ref 4.0–10.5)
nRBC: 0 % (ref 0.0–0.2)

## 2018-06-29 LAB — TROPONIN I
Troponin I: 0.31 ng/mL (ref <0.03)
Troponin I: 0.21 ng/mL (ref <0.03)

## 2018-06-29 LAB — BASIC METABOLIC PANEL
Anion gap: 6 (ref 5–15)
BUN: 12 mg/dL (ref 8–23)
CO2: 25 mmol/L (ref 22–32)
Calcium: 8.5 mg/dL — ABNORMAL LOW (ref 8.9–10.3)
Chloride: 110 mmol/L (ref 98–111)
Creatinine, Ser: 0.91 mg/dL (ref 0.44–1.00)
GFR calc Af Amer: >60 mL/min (ref >60)
GFR calc non Af Amer: >60 mL/min (ref >60)
Glucose, Bld: 97 mg/dL (ref 70–99)
Potassium: 3.8 mmol/L (ref 3.5–5.1)
Sodium: 141 mmol/L (ref 135–145)

## 2018-06-29 MED ORDER — CLOPIDOGREL BISULFATE 75 MG PO TABS: 75.0000 mg | ORAL_TABLET | Freq: Every day | ORAL | 3 refills | Status: DC

## 2018-06-29 MED ORDER — ACETAMINOPHEN 325 MG PO TABS: 650.0000 mg | ORAL_TABLET | ORAL | Status: DC | PRN

## 2018-06-29 NOTE — Progress Notes (Addendum)
Progress Note  Patient Name: Kaylee Hansen Date of Encounter: 06/29/2018  Primary Cardiologist: [name], MD   Subjective   Still has somewhat of a migraine headache but overall has no chest pain.  Baseline shortness of breath with her COPD.  Mild cough with COPD.  Inpatient Medications    Scheduled Meds: . aspirin EC  81 mg Oral Daily  . clopidogrel  75 mg Oral Q breakfast  . heparin  5,000 Units Subcutaneous Q8H  . levothyroxine  150 mcg Oral Daily  . mometasone-formoterol  2 puff Inhalation BID  . PARoxetine  40 mg Oral Daily  . phenytoin  100 mg Oral Daily  . phenytoin  200 mg Oral QHS  . rosuvastatin  40 mg Oral Daily  . sodium chloride flush  3 mL Intravenous Q12H  . traZODone  100 mg Oral QHS   Continuous Infusions: . sodium chloride     PRN Meds: sodium chloride, acetaminophen, albuterol, ALPRAZolam, HYDROcodone-acetaminophen, nitroGLYCERIN, ondansetron (ZOFRAN) IV, sodium chloride flush   Vital Signs    Vitals:   06/28/18 1755 06/28/18 1805 06/28/18 2106 06/29/18 0510  BP: (!) 127/36 (!) 124/48 (!) 126/42 (!) 116/38  Pulse: 82 85 81 76  Resp: (!) 21 (!) 22 (!) 21 17  Temp:   98.5 F (36.9 C) 97.8 F (36.6 C)  TempSrc:   Oral Oral  SpO2: 98% 97% 93% 95%  Weight:    69 kg    Intake/Output Summary (Last 24 hours) at 06/29/2018 0819 Last data filed at 06/29/2018 0552 Gross per 24 hour  Intake 240 ml  Output 1200 ml  Net -960 ml   Filed Weights   06/28/18 1445 06/29/18 0510  Weight: 68 kg 69 kg    Telemetry    Sinus rhythm no adverse arrhythmias- Personally Reviewed  ECG    06/29/2018 sinus rhythm, incomplete right bundle branch block like pattern, diffuse inferior lateral Q waves (no evidence of wall motion abnormality on echo)- Personally Reviewed, no significant change from prior  Physical Exam   GEN: No acute distress.   Neck: No JVD Cardiac: RRR, 2/6 systolic as well as diastolic right upper sternal border murmur, no rubs, or gallops.   Catheterization site normal Respiratory:  Scattered wheeze right greater than left, cough with deep inspiration. GI: Soft, nontender, non-distended  MS: No edema; No deformity. Neuro:  Nonfocal  Psych: Normal affect   Labs    Chemistry Recent Labs  Lab 06/28/18 1156 06/29/18 0503  NA 142 141  K 4.2 3.8  CL 106 110  CO2 28 25  GLUCOSE 92 97  BUN 12 12  CREATININE 0.88 0.91  CALCIUM 9.2 8.5*  GFRNONAA >60 >60  GFRAA >60 >60  ANIONGAP 8 6     Hematology Recent Labs  Lab 06/28/18 1156 06/29/18 0503  WBC 8.3 6.0  RBC 4.03 3.53*  HGB 13.0 11.1*  HCT 41.5 35.1*  MCV 103.0* 99.4  MCH 32.3 31.4  MCHC 31.3 31.6  RDW 13.1 13.0  PLT 237 196    Cardiac Enzymes Recent Labs  Lab 06/28/18 1902 06/29/18 0007 06/29/18 0503  TROPONINI 0.23* 0.31* 0.21*    Recent Labs  Lab 06/28/18 1204  TROPIPOC 0.28*     BNPNo results for input(s): BNP, PROBNP in the last 168 hours.   DDimer No results for input(s): DDIMER in the last 168 hours.   Radiology    Dg Chest 2 View  Result Date: 06/28/2018 CLINICAL DATA:  63 year old female with  chest pain today. EXAM: CHEST - 2 VIEW COMPARISON:  Coronary CTA 06/22/2017 and earlier. FINDINGS: Upright AP and lateral views. Mediastinal contours are stable with mild tortuosity of the ascending aorta. Stable lung volumes. No pneumothorax, pulmonary edema, pleural effusion or confluent pulmonary opacity. Chronic bilateral rib fractures. Visualized tracheal air column is within normal limits. No acute osseous abnormality identified. Stable cholecystectomy clips. Negative visible bowel gas pattern. IMPRESSION: No acute cardiopulmonary abnormality. Electronically Signed   By: Genevie Ann M.D.   On: 06/28/2018 12:32   Ct Head Wo Contrast  Result Date: 06/28/2018 CLINICAL DATA:  CT head w/o contrast due to head traumaPt in c/o chest pain that started last night, took nitro and pain improved, symptoms returned this morning, reports shortness of breath and  nausea EXAM: CT HEAD WITHOUT CONTRAST TECHNIQUE: Contiguous axial images were obtained from the base of the skull through the vertex without intravenous contrast. COMPARISON:  02/25/2015 FINDINGS: Brain: No evidence of acute infarction, hemorrhage, hydrocephalus, extra-axial collection or mass lesion/mass effect. There is ventricular sulcal enlargement reflecting mild generalized atrophy. Minor periventricular white matter hypoattenuation is also noted consistent with chronic microvascular ischemic change. Vascular: No hyperdense vessel or unexpected calcification. Skull: No fracture. Sinuses/Orbits: Globes and orbits are unremarkable. Sinuses and mastoid air cells are clear. Other: None. IMPRESSION: 1. No acute intracranial abnormalities. 2. Mild atrophy and minor chronic microvascular ischemic change. Electronically Signed   By: Lajean Manes M.D.   On: 06/28/2018 14:10    Cardiac Studies   Cardiac catheterization 06/28/2018:  Ost LM to Mid LM lesion is 30% stenosed.  Ost Cx to Prox Cx lesion is 40% stenosed.  Prox RCA lesion is 90% stenosed.  Post intervention, there is a 0% residual stenosis.  A drug-eluting stent was successfully placed using a STENT SYNERGY DES 3.5X16.  The left ventricular systolic function is normal.  LV end diastolic pressure is normal.  The left ventricular ejection fraction is 55-65% by visual estimate.   1. 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 Normal LV function 3. Normal LVEDP 15 mm Hg 4. Successful PCI of the proximal RCA with DES.   Plan: DAPT for one year. Anticipate DC in am  Echocardiogram 06/13/2018: - Left ventricle: The cavity size was mildly dilated. Wall   thickness was normal. Systolic function was normal. The estimated   ejection fraction was in the range of 60% to 65%. Wall motion was   normal; there were no regional wall motion abnormalities. Doppler   parameters are consistent with abnormal left ventricular    relaxation (grade 1 diastolic dysfunction). Doppler parameters   are consistent with high ventricular filling pressure. - Aortic valve: There was mild stenosis. There was severe   regurgitation. Mean gradient (S): 11 mm Hg. Peak gradient (S): 24   mm Hg. - Mitral valve: Calcified annulus. There was mild regurgitation.  Impressions:  - Normal LV systolic function; mild diastolic dysfunction; mild   LVE; severe AI; mild AS (mean gradient 11 mmHg); mild MR.  Patient Profile     63 y.o. female here with non-ST elevation myocardial infarction, proximal RCA DES.  Assessment & Plan    Non-ST elevation myocardial infarction - Has prior history of chronic chest pain which was felt to be musculoskeletal in the past and a coronary CT done approximately 1 year ago showed soft plaque in the RCA with FFR greater than 0.9.  Her troponin however was elevated at 0.28.  She continued have chest pain with radiation  to left arm.  She underwent cardiac catheterization. - Proximal RCA was 90%, successfully stented.  Synergy DES 3.5 x 16.  Dual antiplatelet therapy for 1 year. - Aspirin isosorbide Crestor Plavix  Very minimal aortic stenosis/reported severe aortic insufficiency -Echocardiogram 06/13/2018 shows mean gradient of 11 mmHg.  Severe aortic regurgitation was also reported.  For now, continue with medical management.  Obviously if LV dilatation occurs/reduction in ejection fraction/increasing dyspnea, one must consider aortic valve replacement given his aortic insufficiency, however Dr. Ellyn Hack doubted that she would be a surgical candidate given her underlying lung disease and recommended structural team evaluation which can be performed as an outpatient. -No from Dr. Allison Quarry prior note there was moderate aortic insufficiency by pressure half-time but visually looks severe.  Severe oxygen dependent COPD -Continue home oxygen Symbicort and albuterol  Tobacco use - She has done everything she can  to try to quit smoking.  This is her New Year's resolution again this year.  Continue to encourage.  Comfortable with follow-up as outpatient.  She already has an appointment with Dr. Ellyn Hack next several days.  Since she just had an echocardiogram performed, we will forego echocardiogram here during this hospitalization.     For questions or updates, please contact [place] Please consult [email or web address] for contact info under        Signed, Candee Furbish, MD  06/29/2018, 8:19 AM

## 2018-06-29 NOTE — Progress Notes (Signed)
TR BAND REMOVAL  LOCATION:    right radial  DEFLATED PER PROTOCOL:    Yes.    TIME BAND OFF / DRESSING APPLIED:    2150   SITE UPON ARRIVAL:    Level 0  SITE AFTER BAND REMOVAL:    Level 1(bruise)  CIRCULATION SENSATION AND MOVEMENT:    Within Normal Limits   Yes.    COMMENTS:   Post TR band instructions given, sterile dressing applied, good cap. refill

## 2018-06-29 NOTE — Discharge Summary (Addendum)
Discharge Summary    Patient ID: Kaylee Hansen MRN: [id number]; DOB: 02/18/1956  Admit date: 06/28/2018 Discharge date: 06/29/2018  Primary Care Provider: Cassandria Anger, MD  Primary Cardiologist: [name], MD  Primary Electrophysiologist:  None   Discharge Diagnoses    Non-ST elevation myocardial infarction - Presented 06/28/18 to the ED with chest pain. Her troponin was elevated at 0.28. She underwent cardiac catheterization 06/28/18.- Proximal RCA was 90%- successfully stented.  Synergy DES 3.5 x 16.  Dual antiplatelet therapy for 1 year.  Very minimal aortic stenosis/reported severe aortic insufficiency -Echocardiogram 06/13/2018 shows mean gradient of 11 mmHg.  Severe aortic regurgitation was also reported.  For now, continue with medical management.    Severe oxygen dependent COPD -Continue home oxygen Symbicort and albuterol  Tobacco use - She has done everything she can to try to quit smoking.  This is her New Year's resolution again this year.  Continue to encourage.  Allergies Allergies  Allergen Reactions  . Morphine     "Makes me go crazy."   . Nitrofuran Derivatives     ??confusion  . Oxycodone Hcl Other (See Comments)    Knocked her out for 6 days  . Penicillins Hives    Has patient had a PCN reaction causing immediate rash, facial/tongue/throat swelling, SOB or lightheadedness with hypotension: unknown Has patient had a PCN reaction causing severe rash involving mucus membranes or skin necrosis: unknown Has patient had a PCN reaction that required hospitalization No Has patient had a PCN reaction occurring within the last 10 years: No If all of the above answers are "NO", then may proceed with Cephalosporin use.   . Tape Other (See Comments)    Skin turns red and burns    Diagnostic Studies/Procedures    Cath-PCI 06/28/18 _____________   History of Present Illness     Pt has a prior history of chronic chest pain which was felt to be  musculoskeletal in the past and a coronary CT done approximately 1 year ago showed soft plaque in the RCA with FFR greater than 0.9. presented to the ED 06/28/18 with chest pain and elevated Troponin-0.28.   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    She was seen last by Dr. Ellyn Hack on 12/24/2017. At that office visit she complained of off-and-on episodes of chest pain and palpitations but apparently she has problems with chronic chest pain.  She is also had problems with orthostatic hypotension felt due to sedentary state. Dr. Ellyn Hack ordered a repeat echo in December which showed normal LV function with EF 60 to 65% with mild LV dilatation.  There is mild aortic valve stenosis and severe AI the pressure half-time is 327 ms.  The end-diastolic dimension was 55 mm and LV end-systolic dimension 34 mm.  He felt that she was likely not a surgical candidate due to her underlying lung disease and was going to discuss with structural heart team.  A coronary CTA was done a year ago due to chest pain which showed a calcified plaque that was concerning for primary soft plaque in the proximal RCA with moderate stenosis.  FFR of the RCA was 0.9 and not hemodynamically significant.  The  patient ruled in by troponin. She continue d to have chest pain and was taken to the cath lab where cath showed a 90% pRCA stenosis which was treated with DES. She tolerated this well.  She was seen the morning of 06/29/18 by Dr Marlou Porch.  He noted that if LV dilatation occurs or she has reduction in her ejection fraction or increasing dyspnea, one must consider aortic valve replacement given his aortic insufficiency,  however Dr. Ellyn Hack doubted that she would be a surgical candidate given her underlying lung disease and recommended structural team evaluation which can be performed as an outpatient.  _____________  Discharge Vitals Blood pressure (!) 103/42, pulse 81, temperature 97.8 F (36.6 C), temperature source Oral, resp. rate 17, weight 69 kg, SpO2 95 %.  Filed Weights   06/28/18 1445 06/29/18 0510  Weight: 68 kg 69 kg    Labs & Radiologic Studies    CBC Recent Labs    06/28/18 1156 06/29/18 0503  WBC 8.3 6.0  HGB 13.0 11.1*  HCT 41.5 35.1*  MCV 103.0* 99.4  PLT 237 096   Basic Metabolic Panel Recent Labs    06/28/18 1156 06/29/18 0503  NA 142 141  K 4.2 3.8  CL 106 110  CO2 28 25  GLUCOSE 92 97  BUN 12 12  CREATININE 0.88 0.91  CALCIUM 9.2 8.5*   Liver Function Tests No results for input(s): AST, ALT, ALKPHOS, BILITOT, PROT, ALBUMIN in the last 72 hours. No results for input(s): LIPASE, AMYLASE in the last 72 hours. Cardiac Enzymes Recent Labs    06/28/18 1902 06/29/18 0007 06/29/18 0503  TROPONINI 0.23* 0.31* 0.21*   BNP Invalid input(s): POCBNP D-Dimer No results for input(s): DDIMER in the last 72 hours. Hemoglobin A1C No results for input(s): HGBA1C in the last 72 hours. Fasting Lipid Panel Recent Labs    06/29/18 0007  CHOL 128  HDL 35*  LDLCALC 56  TRIG 184*  CHOLHDL 3.7   Thyroid Function Tests No results for input(s): TSH, T4TOTAL, T3FREE, THYROIDAB in the last 72 hours.  Invalid input(s): FREET3 _____________  Dg Chest 2 View  Result Date: 06/28/2018 CLINICAL DATA:  63 year old female with chest pain today. EXAM: CHEST - 2 VIEW COMPARISON:  Coronary CTA 06/22/2017 and earlier. FINDINGS: Upright AP and lateral views. Mediastinal contours are stable with mild tortuosity of the ascending aorta. Stable lung volumes. No pneumothorax, pulmonary edema, pleural effusion or confluent pulmonary opacity. Chronic bilateral rib fractures. Visualized  tracheal air column is within normal limits. No acute osseous abnormality identified. Stable cholecystectomy clips. Negative visible bowel gas pattern. IMPRESSION: No acute cardiopulmonary abnormality. Electronically Signed   By: Genevie Ann M.D.   On: 06/28/2018 12:32   Ct Head Wo Contrast  Result Date: 06/28/2018 CLINICAL DATA:  CT head w/o contrast due to head traumaPt in c/o chest pain that started last night, took nitro and pain improved, symptoms returned this morning, reports shortness of breath and nausea EXAM: CT HEAD WITHOUT CONTRAST TECHNIQUE: Contiguous axial images were obtained from the base of the skull through the vertex without intravenous contrast. COMPARISON:  02/25/2015 FINDINGS: Brain: No evidence of acute infarction, hemorrhage, hydrocephalus, extra-axial collection or mass lesion/mass effect. There is ventricular sulcal enlargement reflecting mild generalized atrophy. Minor periventricular white matter hypoattenuation is also noted consistent with chronic microvascular ischemic change. Vascular: No hyperdense vessel or unexpected calcification. Skull: No fracture. Sinuses/Orbits: Globes and orbits are unremarkable. Sinuses and mastoid air cells are clear. Other: None. IMPRESSION: 1. No  acute intracranial abnormalities. 2. Mild atrophy and minor chronic microvascular ischemic change. Electronically Signed   By: Lajean Manes M.D.   On: 06/28/2018 14:10   Disposition   Pt is being discharged home today in good condition.  Follow-up Plans & Appointments    Follow-up Information    Leonie Man, MD Follow up on 07/03/2018.   Specialty:  Cardiology Why:  11:00 am Contact information: [address] [place] [address] [phone]          Discharge Instructions    Amb Referral to Cardiac Rehabilitation   Complete by:  As directed    Patient lives in [place] will send letter to [facility] cardiac rehab, and pt will decide based on transportation which  program she would like to enroll in.   Diagnosis:   NSTEMI Coronary Stents        Discharge Medications   Allergies as of 06/29/2018      Reactions   Morphine    "Makes me go crazy."    Nitrofuran Derivatives    ??confusion   Oxycodone Hcl Other (See Comments)   Knocked her out for 6 days   Penicillins Hives   Has patient had a PCN reaction causing immediate rash, facial/tongue/throat swelling, SOB or lightheadedness with hypotension: unknown Has patient had a PCN reaction causing severe rash involving mucus membranes or skin necrosis: unknown Has patient had a PCN reaction that required hospitalization No Has patient had a PCN reaction occurring within the last 10 years: No If all of the above answers are "NO", then may proceed with Cephalosporin use.   Tape Other (See Comments)   Skin turns red and burns      Medication List    TAKE these medications   acetaminophen 325 MG tablet Commonly known as:  TYLENOL Take 2 tablets (650 mg total) by mouth every 4 (four) hours as needed for headache or mild pain.   albuterol 108 (90 Base) MCG/ACT inhaler Commonly known as:  PROVENTIL HFA;VENTOLIN HFA Inhale 2 puffs into the lungs every 6 (six) hours as needed for wheezing. What changed:  Another medication with the same name was removed. Continue taking this medication, and follow the directions you see here.   ALPRAZolam 0.25 MG tablet Commonly known as:  XANAX Take 1 tablet (0.25 mg total) by mouth at bedtime as needed for anxiety.   aspirin EC 81 MG tablet Take 1 tablet (81 mg total) by mouth daily.   budesonide-formoterol 160-4.5 MCG/ACT inhaler Commonly known as:  SYMBICORT Inhale 2 puffs into the lungs 2 (two) times daily at 10 AM and 5 PM. Rinse mouth after each use   clopidogrel 75 MG tablet Commonly known as:  PLAVIX Take 1 tablet (75 mg total) by mouth daily with breakfast. Start taking on:  June 30, 2018   [name] As directed   furosemide 20 MG  tablet Commonly known as:  LASIX TAKE 1 TABLET BY MOUTH ONCE DAILY   guaiFENesin 600 MG 12 hr tablet Commonly known as:  MUCINEX Take 1 tablet (600 mg total) by mouth 2 (two) times daily as needed for cough or to loosen phlegm.   HYDROcodone-acetaminophen 10-325 MG tablet Commonly known as:  NORCO Take 1 tablet by mouth every 6 (six) hours as needed for severe pain. Please fill on or after 07/20/18   isosorbide mononitrate 30 MG 24 hr tablet Commonly known as:  IMDUR TAKE 1 TABLET BY MOUTH ONCE DAILY   levothyroxine 150 MCG  tablet Commonly known as:  SYNTHROID, LEVOTHROID Take 1 tablet (150 mcg total) by mouth daily.   nitroGLYCERIN 0.4 MG SL tablet Commonly known as:  NITROSTAT DISSOLVE ONE TABLET UNDER THE TONGUE EVERY 5 MINUTES AS NEEDED FOR CHEST PAIN.  DO NOT EXCEED A TOTAL OF 3 DOSES IN 15 MINUTES   NON FORMULARY 2.5 lpm with sleep only   PARoxetine 40 MG tablet Commonly known as:  PAXIL Take 1 tablet (40 mg total) by mouth daily.   phenytoin 100 MG ER capsule Commonly known as:  DILANTIN TAKE 100 MG EVERY MORNING AND 200 MG EVERY NIGHT.   promethazine 25 MG tablet Commonly known as:  PHENERGAN Take 1 tablet (25 mg total) by mouth every 8 (eight) hours as needed for nausea or vomiting.   rosuvastatin 40 MG tablet Commonly known as:  CRESTOR Take 1 tablet (40 mg total) by mouth daily. Discontinue 20 mg dose   SUMAtriptan 100 MG tablet Commonly known as:  IMITREX TAKE 1 TABLET BY MOUTH EVERY 2 HOURS AS NEEDED FOR MIAGRAINE OR HEADACHE MAY REPEAT IN 2 HOURS   traZODone 100 MG tablet Commonly known as:  DESYREL TAKE 1 TABLET BY MOUTH EVERYDAY AT BEDTIME   Vitamin D (Ergocalciferol) 1.25 MG (50000 UT) Caps capsule Commonly known as:  DRISDOL TAKE ONE CAPSULE BY MOUTH ONCE A WEEK What changed:  Another medication with the same name was removed. Continue taking this medication, and follow the directions you see here.        Acute coronary syndrome (MI, NSTEMI,  STEMI, etc) this admission?: Yes.     AHA/ACC Clinical Performance & Quality Measures: 1. Aspirin prescribed? - Yes 2. ADP Receptor Inhibitor (Plavix/Clopidogrel, Brilinta/Ticagrelor or Effient/Prasugrel) prescribed (includes medically managed patients)? - Yes 3. Beta Blocker prescribed? - No - severe COPD 4. High Intensity Statin (Lipitor 40-80mg  or Crestor 20-40mg ) prescribed? - Yes 5. EF assessed during THIS hospitalization? - No - Just done 06/13/18 6. For EF <40%, was ACEI/ARB prescribed? - Not Applicable (EF >/= 17%) 7. For EF <40%, Aldosterone Antagonist (Spironolactone or Eplerenone) prescribed? - Not Applicable (EF >/= 61%) 8. Cardiac Rehab Phase II ordered (Included Medically managed Patients)? - Yes     Outstanding Labs/Studies    Duration of Discharge Encounter   Greater than 30 minutes including physician time.  Signed, Kerin Ransom, PA-C 06/29/2018, 11:01 AM  Personally seen and examined. Agree with above.  Primary Cardiologist: [name], MD   Subjective   Still has somewhat of a migraine headache but overall has no chest pain.  Baseline shortness of breath with her COPD.  Mild cough with COPD.  Inpatient Medications    Scheduled Meds: . aspirin EC  81 mg Oral Daily  . clopidogrel  75 mg Oral Q breakfast  . heparin  5,000 Units Subcutaneous Q8H  . levothyroxine  150 mcg Oral Daily  . mometasone-formoterol  2 puff Inhalation BID  . PARoxetine  40 mg Oral Daily  . phenytoin  100 mg Oral Daily  . phenytoin  200 mg Oral QHS  . rosuvastatin  40 mg Oral Daily  . sodium chloride flush  3 mL Intravenous Q12H  . traZODone  100 mg Oral QHS   Continuous Infusions: . sodium chloride     PRN Meds: sodium chloride, acetaminophen, albuterol, ALPRAZolam, HYDROcodone-acetaminophen, nitroGLYCERIN, ondansetron (ZOFRAN) IV, sodium chloride flush   Vital Signs          Vitals:   06/28/18 1755 06/28/18 1805 06/28/18 2106 06/29/18 0510  BP: (!) 127/36 (!)  124/48 (!) 126/42 (!) 116/38  Pulse: 82 85 81 76  Resp: (!) 21 (!) 22 (!) 21 17  Temp:   98.5 F (36.9 C) 97.8 F (36.6 C)  TempSrc:   Oral Oral  SpO2: 98% 97% 93% 95%  Weight:    69 kg    Intake/Output Summary (Last 24 hours) at 06/29/2018 0819 Last data filed at 06/29/2018 4696    Gross per 24 hour  Intake 240 ml  Output 1200 ml  Net -960 ml       Filed Weights   06/28/18 1445 06/29/18 0510  Weight: 68 kg 69 kg    Telemetry    Sinus rhythm no adverse arrhythmias- Personally Reviewed  ECG    06/29/2018 sinus rhythm, incomplete right bundle branch block like pattern, diffuse inferior lateral Q waves (no evidence of wall motion abnormality on echo)- Personally Reviewed, no significant change from prior  Physical Exam   GEN:No acute distress.   Neck:No JVD Cardiac:RRR, 2/6 systolic as well as diastolic right upper sternal border murmur, no rubs, or gallops.  Catheterization site normal Respiratory: Scattered wheeze right greater than left, cough with deep inspiration. EX:BMWU, nontender, non-distended  MS:No edema; No deformity. Neuro:Nonfocal  Psych: Normal affect   Labs    Chemistry LastLabs      Recent Labs  Lab 06/28/18 1156 06/29/18 0503  NA 142 141  K 4.2 3.8  CL 106 110  CO2 28 25  GLUCOSE 92 97  BUN 12 12  CREATININE 0.88 0.91  CALCIUM 9.2 8.5*  GFRNONAA >60 >60  GFRAA >60 >60  ANIONGAP 8 6       Hematology LastLabs      Recent Labs  Lab 06/28/18 1156 06/29/18 0503  WBC 8.3 6.0  RBC 4.03 3.53*  HGB 13.0 11.1*  HCT 41.5 35.1*  MCV 103.0* 99.4  MCH 32.3 31.4  MCHC 31.3 31.6  RDW 13.1 13.0  PLT 237 196      Cardiac Enzymes LastLabs       Recent Labs  Lab 06/28/18 1902 06/29/18 0007 06/29/18 0503  TROPONINI 0.23* 0.31* 0.21*      LastLabs  Recent Labs  Lab 06/28/18 1204  TROPIPOC 0.28*       BNP LastLabs  No results for input(s): BNP, PROBNP in the last 168 hours.      DDimer  Elie Confer  No results for input(s): DDIMER in the last 168 hours.     Radiology     ImagingResults(Last48hours)  Dg Chest 2 View  Result Date: 06/28/2018 CLINICAL DATA:  63 year old female with chest pain today. EXAM: CHEST - 2 VIEW COMPARISON:  Coronary CTA 06/22/2017 and earlier. FINDINGS: Upright AP and lateral views. Mediastinal contours are stable with mild tortuosity of the ascending aorta. Stable lung volumes. No pneumothorax, pulmonary edema, pleural effusion or confluent pulmonary opacity. Chronic bilateral rib fractures. Visualized tracheal air column is within normal limits. No acute osseous abnormality identified. Stable cholecystectomy clips. Negative visible bowel gas pattern. IMPRESSION: No acute cardiopulmonary abnormality. Electronically Signed   By: Genevie Ann M.D.   On: 06/28/2018 12:32   Ct Head Wo Contrast  Result Date: 06/28/2018 CLINICAL DATA:  CT head w/o contrast due to head traumaPt in c/o chest pain that started last night, took nitro and pain improved, symptoms returned this morning, reports shortness of breath and nausea EXAM: CT HEAD WITHOUT CONTRAST TECHNIQUE: Contiguous axial images were obtained from the base of the skull through the  vertex without intravenous contrast. COMPARISON:  02/25/2015 FINDINGS: Brain: No evidence of acute infarction, hemorrhage, hydrocephalus, extra-axial collection or mass lesion/mass effect. There is ventricular sulcal enlargement reflecting mild generalized atrophy. Minor periventricular white matter hypoattenuation is also noted consistent with chronic microvascular ischemic change. Vascular: No hyperdense vessel or unexpected calcification. Skull: No fracture. Sinuses/Orbits: Globes and orbits are unremarkable. Sinuses and mastoid air cells are clear. Other: None. IMPRESSION: 1. No acute intracranial abnormalities. 2. Mild atrophy and minor chronic microvascular ischemic change. Electronically Signed   By: Lajean Manes  M.D.   On: 06/28/2018 14:10     Cardiac Studies   Cardiac catheterization 06/28/2018:  Ost LM to Mid LM lesion is 30% stenosed.  Ost Cx to Prox Cx lesion is 40% stenosed.  Prox RCA lesion is 90% stenosed.  Post intervention, there is a 0% residual stenosis.  A drug-eluting stent was successfully placed using a STENT SYNERGY DES 3.5X16.  The left ventricular systolic function is normal.  LV end diastolic pressure is normal.  The left ventricular ejection fraction is 55-65% by visual estimate.  1. 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 Normal LV function 3. Normal LVEDP 15 mm Hg 4. Successful PCI of the proximal RCA with DES.   Plan: DAPT for one year. Anticipate DC in am  Echocardiogram 06/13/2018: - Left ventricle: The cavity size was mildly dilated. Wall thickness was normal. Systolic function was normal. The estimated ejection fraction was in the range of 60% to 65%. Wall motion was normal; there were no regional wall motion abnormalities. Doppler parameters are consistent with abnormal left ventricular relaxation (grade 1 diastolic dysfunction). Doppler parameters are consistent with high ventricular filling pressure. - Aortic valve: There was mild stenosis. There was severe regurgitation. Mean gradient (S): 11 mm Hg. Peak gradient (S): 24 mm Hg. - Mitral valve: Calcified annulus. There was mild regurgitation.  Impressions:  - Normal LV systolic function; mild diastolic dysfunction; mild LVE; severe AI; mild AS (mean gradient 11 mmHg); mild MR.  Patient Profile     63 y.o. female here with non-ST elevation myocardial infarction, proximal RCA DES.  Assessment & Plan    Non-ST elevation myocardial infarction - Has prior history of chronic chest pain which was felt to be musculoskeletal in the past and a coronary CT done approximately 1 year ago showed soft plaque in the RCA with FFR greater than 0.9.  Her troponin  however was elevated at 0.28.  She continued have chest pain with radiation to left arm.  She underwent cardiac catheterization. - Proximal RCA was 90%, successfully stented.  Synergy DES 3.5 x 16.  Dual antiplatelet therapy for 1 year. - Aspirin isosorbide Crestor Plavix  Very minimal aortic stenosis/reported severe aortic insufficiency -Echocardiogram 06/13/2018 shows mean gradient of 11 mmHg.  Severe aortic regurgitation was also reported.  For now, continue with medical management.  Obviously if LV dilatation occurs/reduction in ejection fraction/increasing dyspnea, one must consider aortic valve replacement given his aortic insufficiency, however Dr. Ellyn Hack doubted that she would be a surgical candidate given her underlying lung disease and recommended structural team evaluation which can be performed as an outpatient. -No from Dr. Allison Quarry prior note there was moderate aortic insufficiency by pressure half-time but visually looks severe.  Severe oxygen dependent COPD -Continue home oxygen Symbicort and albuterol  Tobacco use - She has done everything she can to try to quit smoking.  This is her New Year's resolution again this year.  Continue to encourage.  Comfortable with follow-up as outpatient.  She already has an appointment with Dr. Ellyn Hack next several days.  Since she just had an echocardiogram performed, we will forego echocardiogram here during this hospitalization.     For questions or updates, please contact [place] Please consult [email or web address] for contact info under        Signed, Candee Furbish, MD

## 2018-06-29 NOTE — Progress Notes (Signed)
[id number] Patient declined ambulation at this time stating she's only able to take 3-4 with a walker and uses a power chair at home to get around. Completed MI/stent education including restrictions, risk factor modification, CP, NTG use, and calling 911, Plavix use, tobacco cessation, and heart healthy eating. Reviewed stent card and MI book with patient. Heart healthy diet and tobacco cessation handout given. Discussed phase 2 cardiac rehab, and patient is interested. Pt lives in [place] but states she has all of her procedures in [place], therefore pt would like her contact information sent to cardiac rehab programs at [facility] and [place] understanding of information provided.  Sol Passer, MS, ACSM CEP

## 2018-06-29 NOTE — Discharge Instructions (Signed)
Coronary Angiogram With Stent, Care After  This sheet gives you information about how to care for yourself after your procedure. Your health care provider may also give you more specific instructions. If you have problems or questions, contact your health care provider.  What can I expect after the procedure?  After your procedure, it is common to have:   Bruising in the area where a small, thin tube (catheter) was inserted. This usually fades within 1-2 weeks.   Blood collecting in the tissue (hematoma) that may be painful to the touch. It should usually decrease in size and tenderness within 1-2 weeks.  Follow these instructions at home:  Insertion area care   Do not take baths, swim, or use a hot tub until your health care provider approves.   You may shower 24-48 hours after the procedure or as directed by your health care provider.   Follow instructions from your health care provider about how to take care of your incision. Make sure you:  ? Wash your hands with soap and water before you change your bandage (dressing). If soap and water are not available, use hand sanitizer.  ? Change your dressing as told by your health care provider.  ? Leave stitches (sutures), skin glue, or adhesive strips in place. These skin closures may need to stay in place for 2 weeks or longer. If adhesive strip edges start to loosen and curl up, you may trim the loose edges. Do not remove adhesive strips completely unless your health care provider tells you to do that.   Remove the bandage (dressing) and gently wash the catheter insertion site with plain soap and water.   Pat the area dry with a clean towel. Do not rub the area, because that may cause bleeding.   Do not apply powder or lotion to the incision area.   Check your incision area every day for signs of infection. Check for:  ? More redness, swelling, or pain.  ? More fluid or blood.  ? Warmth.  ? Pus or a bad smell.  Activity   Do not drive for 24 hours if you  were given a medicine to help you relax (sedative).   Do not lift anything that is heavier than 10 lb (4.5 kg) for 5 days after your procedure or as directed by your health care provider.   Ask your health care provider when it is okay for you:  ? To return to work or school.  ? To resume usual physical activities or sports.  ? To resume sexual activity.  Eating and drinking     Eat a heart-healthy diet. This should include plenty of fresh fruits and vegetables.   Avoid the following types of food:  ? Food that is high in salt.  ? Canned or highly processed food.  ? Food that is high in saturated fat or sugar.  ? Fried food.   Limit alcohol intake to no more than 1 drink a day for non-pregnant women and 2 drinks a day for men. One drink equals 12 oz of beer, 5 oz of wine, or 1 oz of hard liquor.  Lifestyle     Do not use any products that contain nicotine or tobacco, such as cigarettes and e-cigarettes. If you need help quitting, ask your health care provider.   Take steps to manage and control your weight.   Get regular exercise.   Manage your blood pressure.   Manage other health problems, such as diabetes.    General instructions   Take over-the-counter and prescription medicines only as told by your health care provider. Blood thinners may be prescribed after your procedure to improve blood flow through the stent.   If you need an MRI after your heart stent has been placed, be sure to tell the health care provider who orders the MRI that you have a heart stent.   Keep all follow-up visits as directed by your health care provider. This is important.  Contact a health care provider if:   You have a fever.   You have chills.   You have increased bleeding from the catheter insertion area. Hold pressure on the area.  Get help right away if:   You develop chest pain or shortness of breath.   You feel faint or you pass out.   You have unusual pain at the catheter insertion area.   You have redness,  warmth, or swelling at the catheter insertion area.   You have drainage (other than a small amount of blood on the dressing) from the catheter insertion area.   The catheter insertion area is bleeding, and the bleeding does not stop after 30 minutes of holding steady pressure on the area.   You develop bleeding from any other place, such as from your rectum. There may be bright red blood in your urine or stool, or it may appear as black, tarry stool.  This information is not intended to replace advice given to you by your health care provider. Make sure you discuss any questions you have with your health care provider.  Document Released: 12/30/2004 Document Revised: 03/09/2016 Document Reviewed: 03/09/2016  [employer]vier Interactive Patient Education  2019 [employer]

## 2018-07-01 ENCOUNTER — Telehealth: Payer: Self-pay | Admitting: *Deleted

## 2018-07-01 ENCOUNTER — Encounter (HOSPITAL_COMMUNITY): Payer: Self-pay | Admitting: Cardiology

## 2018-07-01 NOTE — Telephone Encounter (Signed)
Pt was on TCM report pt was seen 06/28/18 to the ED with chest pain. 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 Pt was d/c 06/29/18, and will follow-up w/ her cardiologist Dr. Ellyn Hack next several days.Marland KitchenJohny Chess

## 2018-07-03 ENCOUNTER — Encounter: Payer: Self-pay | Admitting: Cardiology

## 2018-07-03 ENCOUNTER — Ambulatory Visit: Payer: Medicare HMO | Admitting: Cardiology

## 2018-07-03 VITALS — BP 124/48 | HR 88 | Ht 65.0 in | Wt 148.6 lb

## 2018-07-03 DIAGNOSIS — I351 Nonrheumatic aortic (valve) insufficiency: Secondary | ICD-10-CM

## 2018-07-03 DIAGNOSIS — I10 Essential (primary) hypertension: Secondary | ICD-10-CM | POA: Diagnosis not present

## 2018-07-03 DIAGNOSIS — E785 Hyperlipidemia, unspecified: Secondary | ICD-10-CM

## 2018-07-03 DIAGNOSIS — I25119 Atherosclerotic heart disease of native coronary artery with unspecified angina pectoris: Secondary | ICD-10-CM | POA: Diagnosis not present

## 2018-07-03 DIAGNOSIS — R69 Illness, unspecified: Secondary | ICD-10-CM | POA: Diagnosis not present

## 2018-07-03 DIAGNOSIS — Z720 Tobacco use: Secondary | ICD-10-CM | POA: Diagnosis not present

## 2018-07-03 DIAGNOSIS — F17210 Nicotine dependence, cigarettes, uncomplicated: Secondary | ICD-10-CM

## 2018-07-03 MED ORDER — DILTIAZEM HCL ER COATED BEADS 120 MG PO CP24: 120.0000 mg | ORAL_CAPSULE | Freq: Every day | ORAL | 3 refills | Status: DC

## 2018-07-03 NOTE — Patient Instructions (Signed)
.  Medication Instructions:   CONTINUE CURRENT MEDICATIONS  START TAKING  DILTIAZEM CD 120 MG  TAKE AT BEDTIME DAILY   If you need a refill on your cardiac medications before your next appointment, please call your pharmacy.   Lab work:   NEED LIPIDS  IN 3 MONTHS ( April 2020)  Will mail labslip in [place].  If you have labs (blood work) drawn today and your tests are completely normal, you will receive your results only by: [name] MyChart Message (if you have MyChart) OR . A paper copy in the mail If you have any lab test that is abnormal or we need to change your treatment, we will call you to review the results.  Testing/Procedures:  NOT NEEDED   Follow-Up: At [facility], you and your health needs are our priority.  As part of our continuing mission to provide you with exceptional heart care, we have created designated Provider Care Teams.  These Care Teams include your primary Cardiologist (physician) and Advanced Practice Providers (APPs -  Physician Assistants and Nurse Practitioners) who all work together to provide you with the care you need, when you need it. You will need a follow up appointment in 6 months July 2020.  Please call our office 2 months in advance to schedule this appointment.  You may see [name], MD or one of the following Advanced Practice Providers on your designated Care Team:   Rosaria Ferries, PA-C . Jory Sims, DNP, ANP- Your physician recommends that you schedule a follow-up appointment in 3 MONTHS -April 2020  .   Any Other Special Instructions Will Be Listed Below (If Applicable).  You have been referred to see DR Ricard Dillon at [facility]

## 2018-07-03 NOTE — Progress Notes (Signed)
PCP: Plotnikov, [name], MD  Clinic Note: Chief Complaint  Patient presents with  . Hospitalization Follow-up    nstemi  . Coronary Artery Disease    RCA PCI  . Cardiac Valve Problem    severe AI    HPI: Kaylee Hansen is a 63 y.o. female with a complicated PMH described below, but notably from a cardiac standpoint severe aortic regurgitation and now recent non-STEMI-CAD-PCI who presents today for post hospital follow-up after non-STEMI on June 28, 2018.  Kaylee Hansen was last seen on December 24, 2017 for follow-up of severe regurgitation.  Recent Hospitalizations:   June 28, 2018: Non-STEMI, cath and PCI.  Noted severe central chest pressure and tightness.  Troponin was 0.28.  Studies Personally Reviewed - (if available, images/films reviewed: From Epic Chart or Care Everywhere)  TTE 06/13/2018: EF 60-65%.  No R WMA.  GR 1 DD.  Mild aortic stenosis with severe regurgitation.  Mild MR..  Only mild LV dilation noted.  Left Heart Cath-PCI June 28, 2018: 90% prox RCA -> PCI with Synergy DES 3.5 mm x 16 mm (3.8 mm). Ost-mLM 30%. Ost-prox Cx 40%.   EF 55-65%. LVEDP 15 mmHg.   Interval History: Kaylee Hansen returns here today following her non-STEMI with cardiac catheterization.  Since that time she has not had any more of the chest discomfort that she had which was a deep heavy pressure-like band across her chest that lasted off and on up to 46minutes on the night prior to admission.  She has had some strange right-sided discomfort and other short-lived episodes that are not similar to that.  Unfortunately, since she has been home, she has been troubled with pretty significant migraine headaches with photophobia and light sensitivity.  She is been having some off-and-on chills yesterday, but no real fevers.  She is just been feeling really bad because of her headaches.  Otherwise though she is able to get around wheeling herself around the wheelchair without having significant dyspnea.   She still is not strong enough to stand for long period time or certainly to walk because her knee will simply give out on her.  She has very poor balance.  But she does wheel herself around and does not get too short of breath beyond her baseline and has no chest pain. She has occasional palpitations but no rapid irregular heartbeats.  She may get dizzy with standing up because of poor balance, but does not have any syncope or near syncope.  No TIA or amaurosis fugax symptoms.  Just the photophobia aura with her headaches.  She is also been dealing with some constipation issues that seem to be a longstanding problem since treatment of her original colitis episodes during which she had loose stools.  Now she has been pretty much constipated for quite some time and as a result will sometimes have some blood in her toilet paper when she wipes.  She is not had any frank melena or hematochezia.  No hematuria or epistaxis. No PND, orthopnea or edema.  Does not walk, therefore unable to assess claudication.  ROS: A comprehensive was performed. Review of Systems  HENT: Negative for nosebleeds.   Eyes: Positive for photophobia. Negative for blurred vision.  Respiratory: Positive for cough, shortness of breath (Baseline) and wheezing.   Gastrointestinal: Positive for blood in stool (With wiping) and constipation. Negative for melena.  Genitourinary: Negative for hematuria.  Musculoskeletal: Positive for back pain and joint pain (Knee pain). Negative for falls (None  in the last couple months).  Skin: Negative.   Neurological: Positive for weakness (Globalized) and headaches (Has been having significant migraine headaches over the last several days with photophobia.). Negative for focal weakness.  Endo/Heme/Allergies: Bruises/bleeds easily.  All other systems reviewed and are negative.  I have reviewed and (if needed) personally updated the patient's problem list, medications, allergies, past medical and  surgical history, social and family history.   Past Medical History:  Diagnosis Date  . Anxiety   . Aortic insufficiency with aortic stenosis 04/2017   Echo with severe aortic insufficiency with mild aortic stenosis.  [name] CAD S/P percutaneous coronary angioplasty 11/2017   Proximal RCA PCI Synergy DES 3.5 mm x 16 mm (3.8 mm). Ost-mLM 30%. Ost-prox Cx 40%.  . Collagenous colitis   . Colon adenomas 2011  . Constipation    Chronic abdominal pain and constipation  . COPD (chronic obstructive pulmonary disease) ([place])   . Depression   . Diverticulosis   . Esophageal stricture 11/08/2012  . Esophageal ulcer    04/2009 EGD  . GERD (gastroesophageal reflux disease)   . Helicobacter pylori gastritis 2010   Pylera Tx  . History of cholecystectomy   . Hx of appendectomy   . Hx of cancer of lung 1999  . Hx of hysterectomy   . Hyperlipidemia   . Hypothyroidism   . Low back pain   . Non-STEMI (non-ST elevated myocardial infarction) ([place]) 11/2017   RCA PCI  . Osteoarthritis of knee    bilateral knee  . Seizures ([place])   . Stroke [facility])    CVA, hx of 97  . 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)     Past Surgical History:  Procedure Laterality Date  . ABDOMINAL HYSTERECTOMY     complete 1992  . ABDOMINAL SURGERY     Exploratory  . APPENDECTOMY    . BALLOON DILATION N/A 11/08/2012   Procedure: BALLOON DILATION;  Surgeon: Gatha Mayer, MD;  Location: [facility] ENDOSCOPY;  Service: Endoscopy;  Laterality: N/A;  . CHOLECYSTECTOMY    . COLONOSCOPY W/ BIOPSIES     multiple  . CORONARY STENT INTERVENTION N/A 06/28/2018   Procedure: CORONARY STENT INTERVENTION;  Surgeon: Martinique, Peter M, MD;  Location: [facility];  Service: Cardiovascular;  Laterality: N/A;  90% prox RCA -> PCI with Synergy DES 3.5 mm x 16 mm (3.8 mm).  . CT CTA CORONARY W/CA SCORE W/CM &/OR WO/CM  05/2017   Coronary calcium score 2.9. Intermediate risk. Unusual noncalcified plaque in RCA --FFR negative  . ESOPHAGOGASTRODUODENOSCOPY      w/baloon x 2  . ESOPHAGOGASTRODUODENOSCOPY N/A 11/08/2012   Procedure: ESOPHAGOGASTRODUODENOSCOPY (EGD);  Surgeon: Gatha Mayer, MD;  Location: [facility];  Service: Endoscopy;  Laterality: N/A;  . LEFT HEART CATH AND CORONARY ANGIOGRAPHY N/A 06/28/2018   Procedure: LEFT HEART CATH AND CORONARY ANGIOGRAPHY;  Surgeon: Martinique, Peter M, MD;  Location: [facility];  Service: Cardiovascular:  90% prox RCA -> DES PCI. Ost-mLM 30%. Ost-prox Cx 40%.   EF 55-65%. LVEDP 15 mmHg.   [name] TOTAL HIP ARTHROPLASTY     Left  . TRANSTHORACIC ECHOCARDIOGRAM  04/2017   A) EF 60-65%. GR 1 DD. Mild AS with SEVERE AI;; B) Calcified aortic valve with mild stenosis and moderate to severe AI (visually appears severe, but holodiastolic flow reversal in the descending thoracic aorta not seen).  Mild to moderate MR.  EF 60 to 65% with GR 1 DD and no R WMA.  [name] TRANSTHORACIC  ECHOCARDIOGRAM  05/2018   EF 60-65%.  No R WMA.  GR 1 DD.  Mild aortic stenosis with severe regurgitation.  Mild MR..  Only mild LV dilation noted.    Current Meds  Medication Sig  . acetaminophen (TYLENOL) 325 MG tablet Take 2 tablets (650 mg total) by mouth every 4 (four) hours as needed for headache or mild pain.  [name] albuterol (PROVENTIL HFA;VENTOLIN HFA) 108 (90 Base) MCG/ACT inhaler Inhale 2 puffs into the lungs every 6 (six) hours as needed for wheezing.  [name] ALPRAZolam (XANAX) 0.25 MG tablet Take 1 tablet (0.25 mg total) by mouth at bedtime as needed for anxiety.  [name] aspirin EC 81 MG tablet Take 1 tablet (81 mg total) by mouth daily.  . budesonide-formoterol (SYMBICORT) 160-4.5 MCG/ACT inhaler Inhale 2 puffs into the lungs 2 (two) times daily at 10 AM and 5 PM. Rinse mouth after each use  . clopidogrel (PLAVIX) 75 MG tablet Take 1 tablet (75 mg total) by mouth daily with breakfast.  . furosemide (LASIX) 20 MG tablet TAKE 1 TABLET BY MOUTH ONCE DAILY  . guaiFENesin (MUCINEX) 600 MG 12 hr tablet Take 1 tablet (600 mg total) by mouth 2 (two) times  daily as needed for cough or to loosen phlegm.  [name] HYDROcodone-acetaminophen (NORCO) 10-325 MG tablet Take 1 tablet by mouth every 6 (six) hours as needed for severe pain. Please fill on or after 07/20/18  . isosorbide mononitrate (IMDUR) 30 MG 24 hr tablet TAKE 1 TABLET BY MOUTH ONCE DAILY  . levothyroxine (SYNTHROID, LEVOTHROID) 150 MCG tablet Take 1 tablet (150 mcg total) by mouth daily.  . nitroGLYCERIN (NITROSTAT) 0.4 MG SL tablet DISSOLVE ONE TABLET UNDER THE TONGUE EVERY 5 MINUTES AS NEEDED FOR CHEST PAIN.  DO NOT EXCEED A TOTAL OF 3 DOSES IN 15 MINUTES  . NON FORMULARY 2.5 lpm with sleep only  . PARoxetine (PAXIL) 40 MG tablet Take 1 tablet (40 mg total) by mouth daily.  . phenytoin (DILANTIN) 100 MG ER capsule TAKE 100 MG EVERY MORNING AND 200 MG EVERY NIGHT.  [name] promethazine (PHENERGAN) 25 MG tablet Take 1 tablet (25 mg total) by mouth every 8 (eight) hours as needed for nausea or vomiting.  [name] Respiratory Therapy Supplies (FLUTTER) DEVI As directed  . rosuvastatin (CRESTOR) 40 MG tablet Take 1 tablet (40 mg total) by mouth daily. Discontinue 20 mg dose  . SUMAtriptan (IMITREX) 100 MG tablet TAKE 1 TABLET BY MOUTH EVERY 2 HOURS AS NEEDED FOR MIAGRAINE OR HEADACHE MAY REPEAT IN 2 HOURS  . traZODone (DESYREL) 100 MG tablet TAKE 1 TABLET BY MOUTH EVERYDAY AT BEDTIME  . Vitamin D, Ergocalciferol, (DRISDOL) 50000 units CAPS capsule TAKE ONE CAPSULE BY MOUTH ONCE A WEEK    Allergies  Allergen Reactions  . Morphine     "Makes me go crazy."   . Nitrofuran Derivatives     ??confusion  . Oxycodone Hcl Other (See Comments)    Knocked her out for 6 days  . Penicillins Hives    Has patient had a PCN reaction causing immediate rash, facial/tongue/throat swelling, SOB or lightheadedness with hypotension: unknown Has patient had a PCN reaction causing severe rash involving mucus membranes or skin necrosis: unknown Has patient had a PCN reaction that required hospitalization No Has patient had a PCN  reaction occurring within the last 10 years: No If all of the above answers are "NO", then may proceed with Cephalosporin use.   . Tape Other (See Comments)  Skin turns red and burns    Social History   Tobacco Use  . Smoking status: Current Every Day Smoker    Packs/day: 1.00    Years: 48.00    Pack years: 48.00    Types: Cigarettes  . Smokeless tobacco: Never Used  Substance Use Topics  . Alcohol use: No  . Drug use: No   Social History   Social History Narrative   Married   2 children   No regular exercise          family history includes Coronary artery disease in her mother and another family member; Diabetes in her mother and son; Hypertension in her father; Kidney cancer in her sister; Kidney disease in an other family member.  Wt Readings from Last 3 Encounters:  07/03/18 148 lb 9.6 oz (67.4 kg)  06/29/18 152 lb 1.9 oz (69 kg)  06/12/18 149 lb (67.6 kg)    PHYSICAL EXAM BP (!) 124/48   Pulse 88   Ht 5\' 5"  (1.651 m)   Wt 148 lb 9.6 oz (67.4 kg)   BMI 24.73 kg/m  Physical Exam  Constitutional: She is oriented to person, place, and time. No distress (Just chronically ill-appearing but nontoxic).  Chronically ill-appearing.  Lying on the examining table with the eye mask on because of migraine headache.  Appears older than stated age.  Pervasive cigarette odor.  HENT:  Head: Normocephalic.  Eyes:  Wearing an eye mask because of migraine  Neck: Normal range of motion. Neck supple. Decreased carotid pulses (Has bifid pulse) present. No JVD present. Carotid bruit is not present (Radiated murmur). No tracheal deviation present. No thyromegaly present.  Cardiovascular: Normal rate, regular rhythm, S1 normal and S2 normal.  No extrasystoles are present. PMI is not displaced. Exam reveals distant heart sounds and decreased pulses (Mildly decreased pedal pulses but palpable). Exam reveals no gallop.  Murmur heard.  Low-pitched harsh crescendo-decrescendo early  systolic murmur is present with a grade of 1/6 at the upper right sternal border radiating to the neck.  Low-pitched rumbling early diastolic murmur is present with a grade of 2/6 at the upper left sternal border. Normal Allen's test bilaterally.  Pulmonary/Chest: Effort normal. No respiratory distress. She has no rales. She exhibits tenderness (Right-sided tenderness).  Diffuse coarse rhonchi throughout with both inspiratory and expiratory wheezes.  Makes the remainder the chest auscultation difficult.  Abdominal: Soft. Bowel sounds are normal. She exhibits no distension. There is no abdominal tenderness. There is no rebound.  Musculoskeletal: Normal range of motion.        General: No edema.     Comments: Right radial cath site has tenderness palpation and diffuse ecchymosis in the forearm.  Neurological: She is alert and oriented to person, place, and time.  Skin:  Thick leathery skin  Psychiatric: Judgment normal.  As usual, seems somewhat depressed and anhedonic.  Vitals reviewed.    Adult ECG Report  Rate: 88;  Rhythm: normal sinus rhythm and Incomplete RBBB/RSR prime somewhat suggestive of RV hypertension.  Also cannot exclude inferior MI or anterolateral MI, age undetermined.;   Narrative Interpretation: Essentially stable EKG.   Other studies Reviewed: Additional studies/ records that were reviewed today include:  Recent Labs:   CBC Latest Ref Rng & Units 06/29/2018 06/28/2018 02/28/2018  WBC 4.0 - 10.5 K/uL 6.0 8.3 6.8  Hemoglobin 12.0 - 15.0 g/dL 11.1(L) 13.0 12.9  Hematocrit 36.0 - 46.0 % 35.1(L) 41.5 38.1  Platelets 150 - 400 K/uL 196 237 263.0  Lab Results  Component Value Date   CREATININE 0.91 06/29/2018   BUN 12 06/29/2018   NA 141 06/29/2018   K 3.8 06/29/2018   CL 110 06/29/2018   CO2 25 06/29/2018   Lab Results  Component Value Date   CHOL 128 06/29/2018   HDL 35 (L) 06/29/2018   LDLCALC 56 06/29/2018   LDLDIRECT 187.0 07/14/2015   TRIG 184 (H) 06/29/2018     CHOLHDL 3.7 06/29/2018  --This indicates a dramatic drop in LDL from 142 in September 2019.  Total cholesterol is down from 228  ASSESSMENT / PLAN: Problem List Items Addressed This Visit    Cigarette smoker (Chronic)    Now we will know only that she has severe COPD and history of lung cancer, she has just had a heart attack and has CAD.  Multiple reasons for stopping smoking, but she just does not seem to understand.  They talked about possibly trying Chantix, but she did not want to do that.  Her husband suggested considering hypnosis therapy, but was somewhat skeptical and therefore he feels like starting off with patches or Nicorette gum would probably the best option.  Unfortunately both need to quit in order for 1 of them to be successful.      Coronary artery disease involving native coronary artery of native heart with angina pectoris (HCC) - Primary (Chronic)    Essentially single-vessel disease involving a severe RCA lesion there was a culprit lesion for MI, treated with DES stent.  Now on DAPT with aspirin and Plavix.  Also remains on Imdur and statin.  Reluctant to use beta-blocker because of her COPD, will start low-dose diltiazem for combination of antianginal effect as well as likely pulmonary pretension benefit      Relevant Medications   diltiazem (CARDIZEM CD) 120 MG 24 hr capsule   Other Relevant Orders   EKG 12-Lead   Ambulatory referral to Cardiothoracic Surgery   Essential hypertension, benign (Chronic)    Borderline pressures.  Really only on furosemide and Imdur.  Not really hypotensive, but should have room to add diltiazem which would be in lieu of beta-blocker.      Relevant Medications   diltiazem (CARDIZEM CD) 120 MG 24 hr capsule   Hyperlipidemia with target LDL less than 100 (Chronic)    Actually just had lipids checked.  Crestor was relatively new and she has had a dramatic improvement in lipids since then based on her most recent labs.  We will simply  continue Crestor at current dose.      Relevant Medications   diltiazem (CARDIZEM CD) 120 MG 24 hr capsule   Severe Stage C1 aortic regurgitation by prior echocardiography (Chronic)    Again severe aortic insufficiency noted on echo.  It does appear that her EF is still preserved, and the LV is not overly dilated.  I do think there is going to become a point we need to decide whether not she would be a surgical candidate.  I have discussed referral to Dr. Roxy Manns.  (Unfortunately the time of the patient's visit, I had not seen the suggestion of proceeding with right and left heart cath and TEE prior to arranging a consultative visit). As such, we will contact her to discuss prior to CT surgical consultation having her come in for a TEE and since she is already had left heart cath we can just do the right heart cath.  If she is not interested, then we will await prior to referral.  The reason for initial consult will be consideration of whether she would be a potential candidate for valve surgery or not.  I am concerned with her history of COPD and lung cancer etc., that she may not be a very good option, especially since she is essentially wheelchair-bound.  However I do think that the aortic regurgitation will continue to progress and will potentially start leading to worsening of symptoms.  I do think with the extent of valvular disease, that she needs to ensure that she takes precautions for SBE prophylaxis.  This was discussed with the patient.      Relevant Medications   diltiazem (CARDIZEM CD) 120 MG 24 hr capsule   Other Relevant Orders   EKG 12-Lead   Ambulatory referral to Cardiothoracic Surgery   Tobacco abuse disorder (Chronic)      I spent a total of 35-40 minutes with the patient and chart review. >  50% of the time was spent in direct patient consultation.   Current medicines are reviewed at length with the patient today.  (+/- concerns) n/a The following changes have been  made:  see below.   Patient Instructions  .Medication Instructions:   CONTINUE CURRENT MEDICATIONS  START TAKING  DILTIAZEM CD 120 MG  TAKE AT BEDTIME DAILY   If you need a refill on your cardiac medications before your next appointment, please call your pharmacy.   Lab work:   NEED LIPIDS  IN 3 MONTHS ( April 2020)  Will mail labslip in [place].  If you have labs (blood work) drawn today and your tests are completely normal, you will receive your results only by: [name] MyChart Message (if you have MyChart) OR . A paper copy in the mail If you have any lab test that is abnormal or we need to change your treatment, we will call you to review the results.  Testing/Procedures:  NOT NEEDED   Follow-Up: At [facility], you and your health needs are our priority.  As part of our continuing mission to provide you with exceptional heart care, we have created designated Provider Care Teams.  These Care Teams include your primary Cardiologist (physician) and Advanced Practice Providers (APPs -  Physician Assistants and Nurse Practitioners) who all work together to provide you with the care you need, when you need it. You will need a follow up appointment in 6 months July 2020.  Please call our office 2 months in advance to schedule this appointment.  You may see [name], MD or one of the following Advanced Practice Providers on your designated Care Team:   Rosaria Ferries, PA-C . Jory Sims, DNP, ANP- Your physician recommends that you schedule a follow-up appointment in 3 MONTHS -April 2020  .   Any Other Special Instructions Will Be Listed Below (If Applicable).  You have been referred to see DR Ricard Dillon at [facility]    Studies Ordered:   Orders Placed This Encounter  Procedures  . Ambulatory referral to Cardiothoracic Surgery  . EKG 12-Lead      [name], M.D., M.S. Interventional Cardiologist   Pager # [phone] Phone # [phone] [address]. [place], [place] [address]   Thank you for choosing [facility] at [facility]!!

## 2018-07-03 NOTE — H&P (View-Only) (Signed)
PCP: Plotnikov, [name], MD  Clinic Note: Chief Complaint  Patient presents with  . Hospitalization Follow-up    nstemi  . Coronary Artery Disease    RCA PCI  . Cardiac Valve Problem    severe AI    HPI: Kaylee Hansen is a 63 y.o. female with a complicated PMH described below, but notably from a cardiac standpoint severe aortic regurgitation and now recent non-STEMI-CAD-PCI who presents today for post hospital follow-up after non-STEMI on June 28, 2018.  Kaylee Hansen was last seen on December 24, 2017 for follow-up of severe regurgitation.  Recent Hospitalizations:   June 28, 2018: Non-STEMI, cath and PCI.  Noted severe central chest pressure and tightness.  Troponin was 0.28.  Studies Personally Reviewed - (if available, images/films reviewed: From Epic Chart or Care Everywhere)  TTE 06/13/2018: EF 60-65%.  No R WMA.  GR 1 DD.  Mild aortic stenosis with severe regurgitation.  Mild MR..  Only mild LV dilation noted.  Left Heart Cath-PCI June 28, 2018: 90% prox RCA -> PCI with Synergy DES 3.5 mm x 16 mm (3.8 mm). Ost-mLM 30%. Ost-prox Cx 40%.   EF 55-65%. LVEDP 15 mmHg.   Interval History: Kaylee Hansen returns here today following her non-STEMI with cardiac catheterization.  Since that time she has not had any more of the chest discomfort that she had which was a deep heavy pressure-like band across her chest that lasted off and on up to 30minutes on the night prior to admission.  She has had some strange right-sided discomfort and other short-lived episodes that are not similar to that.  Unfortunately, since she has been home, she has been troubled with pretty significant migraine headaches with photophobia and light sensitivity.  She is been having some off-and-on chills yesterday, but no real fevers.  She is just been feeling really bad because of her headaches.  Otherwise though she is able to get around wheeling herself around the wheelchair without having significant dyspnea.   She still is not strong enough to stand for long period time or certainly to walk because her knee will simply give out on her.  She has very poor balance.  But she does wheel herself around and does not get too short of breath beyond her baseline and has no chest pain. She has occasional palpitations but no rapid irregular heartbeats.  She may get dizzy with standing up because of poor balance, but does not have any syncope or near syncope.  No TIA or amaurosis fugax symptoms.  Just the photophobia aura with her headaches.  She is also been dealing with some constipation issues that seem to be a longstanding problem since treatment of her original colitis episodes during which she had loose stools.  Now she has been pretty much constipated for quite some time and as a result will sometimes have some blood in her toilet paper when she wipes.  She is not had any frank melena or hematochezia.  No hematuria or epistaxis. No PND, orthopnea or edema.  Does not walk, therefore unable to assess claudication.  ROS: A comprehensive was performed. Review of Systems  HENT: Negative for nosebleeds.   Eyes: Positive for photophobia. Negative for blurred vision.  Respiratory: Positive for cough, shortness of breath (Baseline) and wheezing.   Gastrointestinal: Positive for blood in stool (With wiping) and constipation. Negative for melena.  Genitourinary: Negative for hematuria.  Musculoskeletal: Positive for back pain and joint pain (Knee pain). Negative for falls (None  in the last couple months).  Skin: Negative.   Neurological: Positive for weakness (Globalized) and headaches (Has been having significant migraine headaches over the last several days with photophobia.). Negative for focal weakness.  Endo/Heme/Allergies: Bruises/bleeds easily.  All other systems reviewed and are negative.  I have reviewed and (if needed) personally updated the patient's problem list, medications, allergies, past medical and  surgical history, social and family history.   Past Medical History:  Diagnosis Date  . Anxiety   . Aortic insufficiency with aortic stenosis 04/2017   Echo with severe aortic insufficiency with mild aortic stenosis.  [name] CAD S/P percutaneous coronary angioplasty 11/2017   Proximal RCA PCI Synergy DES 3.5 mm x 16 mm (3.8 mm). Ost-mLM 30%. Ost-prox Cx 40%.  . Collagenous colitis   . Colon adenomas 2011  . Constipation    Chronic abdominal pain and constipation  . COPD (chronic obstructive pulmonary disease) ([place])   . Depression   . Diverticulosis   . Esophageal stricture 11/08/2012  . Esophageal ulcer    04/2009 EGD  . GERD (gastroesophageal reflux disease)   . Helicobacter pylori gastritis 2010   Pylera Tx  . History of cholecystectomy   . Hx of appendectomy   . Hx of cancer of lung 1999  . Hx of hysterectomy   . Hyperlipidemia   . Hypothyroidism   . Low back pain   . Non-STEMI (non-ST elevated myocardial infarction) ([place]) 11/2017   RCA PCI  . Osteoarthritis of knee    bilateral knee  . Seizures ([place])   . Stroke [facility])    CVA, hx of 97  . 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 ABDOMINAL HYSTERECTOMY     complete 1992  . ABDOMINAL SURGERY     Exploratory  . APPENDECTOMY    . BALLOON DILATION N/A 11/08/2012   Procedure: BALLOON DILATION;  Surgeon: Gatha Mayer, MD;  Location: [facility];  Service: Endoscopy;  Laterality: N/A;  . CHOLECYSTECTOMY    . COLONOSCOPY W/ BIOPSIES     multiple  . CORONARY STENT INTERVENTION N/A 06/28/2018   Procedure: CORONARY STENT INTERVENTION;  Surgeon: Martinique, Peter M, MD;  Location: [place] [facility];  Service: Cardiovascular;  Laterality: N/A;  90% prox RCA -> PCI with Synergy DES 3.5 mm x 16 mm (3.8 mm).  . CT CTA CORONARY W/CA SCORE W/CM &/OR WO/CM  05/2017   Coronary calcium score 2.9. Intermediate risk. Unusual noncalcified plaque in RCA --FFR negative  . ESOPHAGOGASTRODUODENOSCOPY      w/baloon x 2  . ESOPHAGOGASTRODUODENOSCOPY N/A 11/08/2012   Procedure: ESOPHAGOGASTRODUODENOSCOPY (EGD);  Surgeon: Gatha Mayer, MD;  Location: [facility];  Service: Endoscopy;  Laterality: N/A;  . LEFT HEART CATH AND CORONARY ANGIOGRAPHY N/A 06/28/2018   Procedure: LEFT HEART CATH AND CORONARY ANGIOGRAPHY;  Surgeon: Martinique, Peter M, MD;  Location: [place] [facility];  Service: Cardiovascular:  90% prox RCA -> DES PCI. Ost-mLM 30%. Ost-prox Cx 40%.   EF 55-65%. LVEDP 15 mmHg.   [name] TOTAL HIP ARTHROPLASTY     Left  . TRANSTHORACIC ECHOCARDIOGRAM  04/2017   A) EF 60-65%. GR 1 DD. Mild AS with SEVERE AI;; B) Calcified aortic valve with mild stenosis and moderate to severe AI (visually appears severe, but holodiastolic flow reversal in the descending thoracic aorta not seen).  Mild to moderate MR.  EF 60 to 65% with GR 1 DD and no R WMA.  [name] TRANSTHORACIC  ECHOCARDIOGRAM  05/2018   EF 60-65%.  No R WMA.  GR 1 DD.  Mild aortic stenosis with severe regurgitation.  Mild MR..  Only mild LV dilation noted.    Current Meds  Medication Sig  . acetaminophen (TYLENOL) 325 MG tablet Take 2 tablets (650 mg total) by mouth every 4 (four) hours as needed for headache or mild pain.  [name] albuterol (PROVENTIL HFA;VENTOLIN HFA) 108 (90 Base) MCG/ACT inhaler Inhale 2 puffs into the lungs every 6 (six) hours as needed for wheezing.  [name] ALPRAZolam (XANAX) 0.25 MG tablet Take 1 tablet (0.25 mg total) by mouth at bedtime as needed for anxiety.  [name] aspirin EC 81 MG tablet Take 1 tablet (81 mg total) by mouth daily.  . budesonide-formoterol (SYMBICORT) 160-4.5 MCG/ACT inhaler Inhale 2 puffs into the lungs 2 (two) times daily at 10 AM and 5 PM. Rinse mouth after each use  . clopidogrel (PLAVIX) 75 MG tablet Take 1 tablet (75 mg total) by mouth daily with breakfast.  . furosemide (LASIX) 20 MG tablet TAKE 1 TABLET BY MOUTH ONCE DAILY  . guaiFENesin (MUCINEX) 600 MG 12 hr tablet Take 1 tablet (600 mg total) by mouth 2 (two) times  daily as needed for cough or to loosen phlegm.  [name] HYDROcodone-acetaminophen (NORCO) 10-325 MG tablet Take 1 tablet by mouth every 6 (six) hours as needed for severe pain. Please fill on or after 07/20/18  . isosorbide mononitrate (IMDUR) 30 MG 24 hr tablet TAKE 1 TABLET BY MOUTH ONCE DAILY  . levothyroxine (SYNTHROID, LEVOTHROID) 150 MCG tablet Take 1 tablet (150 mcg total) by mouth daily.  . nitroGLYCERIN (NITROSTAT) 0.4 MG SL tablet DISSOLVE ONE TABLET UNDER THE TONGUE EVERY 5 MINUTES AS NEEDED FOR CHEST PAIN.  DO NOT EXCEED A TOTAL OF 3 DOSES IN 15 MINUTES  . NON FORMULARY 2.5 lpm with sleep only  . PARoxetine (PAXIL) 40 MG tablet Take 1 tablet (40 mg total) by mouth daily.  . phenytoin (DILANTIN) 100 MG ER capsule TAKE 100 MG EVERY MORNING AND 200 MG EVERY NIGHT.  [name] promethazine (PHENERGAN) 25 MG tablet Take 1 tablet (25 mg total) by mouth every 8 (eight) hours as needed for nausea or vomiting.  [name] Respiratory Therapy Supplies (FLUTTER) DEVI As directed  . rosuvastatin (CRESTOR) 40 MG tablet Take 1 tablet (40 mg total) by mouth daily. Discontinue 20 mg dose  . SUMAtriptan (IMITREX) 100 MG tablet TAKE 1 TABLET BY MOUTH EVERY 2 HOURS AS NEEDED FOR MIAGRAINE OR HEADACHE MAY REPEAT IN 2 HOURS  . traZODone (DESYREL) 100 MG tablet TAKE 1 TABLET BY MOUTH EVERYDAY AT BEDTIME  . Vitamin D, Ergocalciferol, (DRISDOL) 50000 units CAPS capsule TAKE ONE CAPSULE BY MOUTH ONCE A WEEK    Allergies  Allergen Reactions  . Morphine     "Makes me go crazy."   . Nitrofuran Derivatives     ??confusion  . Oxycodone Hcl Other (See Comments)    Knocked her out for 6 days  . Penicillins Hives    Has patient had a PCN reaction causing immediate rash, facial/tongue/throat swelling, SOB or lightheadedness with hypotension: unknown Has patient had a PCN reaction causing severe rash involving mucus membranes or skin necrosis: unknown Has patient had a PCN reaction that required hospitalization No Has patient had a PCN  reaction occurring within the last 10 years: No If all of the above answers are "NO", then may proceed with Cephalosporin use.   . Tape Other (See Comments)  Skin turns red and burns    Social History   Tobacco Use  . Smoking status: Current Every Day Smoker    Packs/day: 1.00    Years: 48.00    Pack years: 48.00    Types: Cigarettes  . Smokeless tobacco: Never Used  Substance Use Topics  . Alcohol use: No  . Drug use: No   Social History   Social History Narrative   Married   2 children   No regular exercise          family history includes Coronary artery disease in her mother and another family member; Diabetes in her mother and son; Hypertension in her father; Kidney cancer in her sister; Kidney disease in an other family member.  Wt Readings from Last 3 Encounters:  07/03/18 148 lb 9.6 oz (67.4 kg)  06/29/18 152 lb 1.9 oz (69 kg)  06/12/18 149 lb (67.6 kg)    PHYSICAL EXAM BP (!) 124/48   Pulse 88   Ht 5\' 5"  (1.651 m)   Wt 148 lb 9.6 oz (67.4 kg)   BMI 24.73 kg/m  Physical Exam  Constitutional: She is oriented to person, place, and time. No distress (Just chronically ill-appearing but nontoxic).  Chronically ill-appearing.  Lying on the examining table with the eye mask on because of migraine headache.  Appears older than stated age.  Pervasive cigarette odor.  HENT:  Head: Normocephalic.  Eyes:  Wearing an eye mask because of migraine  Neck: Normal range of motion. Neck supple. Decreased carotid pulses (Has bifid pulse) present. No JVD present. Carotid bruit is not present (Radiated murmur). No tracheal deviation present. No thyromegaly present.  Cardiovascular: Normal rate, regular rhythm, S1 normal and S2 normal.  No extrasystoles are present. PMI is not displaced. Exam reveals distant heart sounds and decreased pulses (Mildly decreased pedal pulses but palpable). Exam reveals no gallop.  Murmur heard.  Low-pitched harsh crescendo-decrescendo early  systolic murmur is present with a grade of 1/6 at the upper right sternal border radiating to the neck.  Low-pitched rumbling early diastolic murmur is present with a grade of 2/6 at the upper left sternal border. Normal Allen's test bilaterally.  Pulmonary/Chest: Effort normal. No respiratory distress. She has no rales. She exhibits tenderness (Right-sided tenderness).  Diffuse coarse rhonchi throughout with both inspiratory and expiratory wheezes.  Makes the remainder the chest auscultation difficult.  Abdominal: Soft. Bowel sounds are normal. She exhibits no distension. There is no abdominal tenderness. There is no rebound.  Musculoskeletal: Normal range of motion.        General: No edema.     Comments: Right radial cath site has tenderness palpation and diffuse ecchymosis in the forearm.  Neurological: She is alert and oriented to person, place, and time.  Skin:  Thick leathery skin  Psychiatric: Judgment normal.  As usual, seems somewhat depressed and anhedonic.  Vitals reviewed.    Adult ECG Report  Rate: 88;  Rhythm: normal sinus rhythm and Incomplete RBBB/RSR prime somewhat suggestive of RV hypertension.  Also cannot exclude inferior MI or anterolateral MI, age undetermined.;   Narrative Interpretation: Essentially stable EKG.   Other studies Reviewed: Additional studies/ records that were reviewed today include:  Recent Labs:   CBC Latest Ref Rng & Units 06/29/2018 06/28/2018 02/28/2018  WBC 4.0 - 10.5 K/uL 6.0 8.3 6.8  Hemoglobin 12.0 - 15.0 g/dL 11.1(L) 13.0 12.9  Hematocrit 36.0 - 46.0 % 35.1(L) 41.5 38.1  Platelets 150 - 400 K/uL 196 237 263.0  Lab Results  Component Value Date   CREATININE 0.91 06/29/2018   BUN 12 06/29/2018   NA 141 06/29/2018   K 3.8 06/29/2018   CL 110 06/29/2018   CO2 25 06/29/2018   Lab Results  Component Value Date   CHOL 128 06/29/2018   HDL 35 (L) 06/29/2018   LDLCALC 56 06/29/2018   LDLDIRECT 187.0 07/14/2015   TRIG 184 (H) 06/29/2018     CHOLHDL 3.7 06/29/2018  --This indicates a dramatic drop in LDL from 142 in September 2019.  Total cholesterol is down from 228  ASSESSMENT / PLAN: Problem List Items Addressed This Visit    Cigarette smoker (Chronic)    Now we will know only that she has severe COPD and history of lung cancer, she has just had a heart attack and has CAD.  Multiple reasons for stopping smoking, but she just does not seem to understand.  They talked about possibly trying Chantix, but she did not want to do that.  Her husband suggested considering hypnosis therapy, but was somewhat skeptical and therefore he feels like starting off with patches or Nicorette gum would probably the best option.  Unfortunately both need to quit in order for 1 of them to be successful.      Coronary artery disease involving native coronary artery of native heart with angina pectoris (HCC) - Primary (Chronic)    Essentially single-vessel disease involving a severe RCA lesion there was a culprit lesion for MI, treated with DES stent.  Now on DAPT with aspirin and Plavix.  Also remains on Imdur and statin.  Reluctant to use beta-blocker because of her COPD, will start low-dose diltiazem for combination of antianginal effect as well as likely pulmonary pretension benefit      Relevant Medications   diltiazem (CARDIZEM CD) 120 MG 24 hr capsule   Other Relevant Orders   EKG 12-Lead   Ambulatory referral to Cardiothoracic Surgery   Essential hypertension, benign (Chronic)    Borderline pressures.  Really only on furosemide and Imdur.  Not really hypotensive, but should have room to add diltiazem which would be in lieu of beta-blocker.      Relevant Medications   diltiazem (CARDIZEM CD) 120 MG 24 hr capsule   Hyperlipidemia with target LDL less than 100 (Chronic)    Actually just had lipids checked.  Crestor was relatively new and she has had a dramatic improvement in lipids since then based on her most recent labs.  We will simply  continue Crestor at current dose.      Relevant Medications   diltiazem (CARDIZEM CD) 120 MG 24 hr capsule   Severe Stage C1 aortic regurgitation by prior echocardiography (Chronic)    Again severe aortic insufficiency noted on echo.  It does appear that her EF is still preserved, and the LV is not overly dilated.  I do think there is going to become a point we need to decide whether not she would be a surgical candidate.  I have discussed referral to Dr. Roxy Manns.  (Unfortunately the time of the patient's visit, I had not seen the suggestion of proceeding with right and left heart cath and TEE prior to arranging a consultative visit). As such, we will contact her to discuss prior to CT surgical consultation having her come in for a TEE and since she is already had left heart cath we can just do the right heart cath.  If she is not interested, then we will await prior to referral.  The reason for initial consult will be consideration of whether she would be a potential candidate for valve surgery or not.  I am concerned with her history of COPD and lung cancer etc., that she may not be a very good option, especially since she is essentially wheelchair-bound.  However I do think that the aortic regurgitation will continue to progress and will potentially start leading to worsening of symptoms.  I do think with the extent of valvular disease, that she needs to ensure that she takes precautions for SBE prophylaxis.  This was discussed with the patient.      Relevant Medications   diltiazem (CARDIZEM CD) 120 MG 24 hr capsule   Other Relevant Orders   EKG 12-Lead   Ambulatory referral to Cardiothoracic Surgery   Tobacco abuse disorder (Chronic)      I spent a total of 35-40 minutes with the patient and chart review. >  50% of the time was spent in direct patient consultation.   Current medicines are reviewed at length with the patient today.  (+/- concerns) n/a The following changes have been  made:  see below.   Patient Instructions  .Medication Instructions:   CONTINUE CURRENT MEDICATIONS  START TAKING  DILTIAZEM CD 120 MG  TAKE AT BEDTIME DAILY   If you need a refill on your cardiac medications before your next appointment, please call your pharmacy.   Lab work:   NEED LIPIDS  IN 3 MONTHS ( April 2020)  Will mail labslip in [place].  If you have labs (blood work) drawn today and your tests are completely normal, you will receive your results only by: [name] MyChart Message (if you have MyChart) OR . A paper copy in the mail If you have any lab test that is abnormal or we need to change your treatment, we will call you to review the results.  Testing/Procedures:  NOT NEEDED   Follow-Up: At [facility], you and your health needs are our priority.  As part of our continuing mission to provide you with exceptional heart care, we have created designated Provider Care Teams.  These Care Teams include your primary Cardiologist (physician) and Advanced Practice Providers (APPs -  Physician Assistants and Nurse Practitioners) who all work together to provide you with the care you need, when you need it. You will need a follow up appointment in 6 months July 2020.  Please call our office 2 months in advance to schedule this appointment.  You may see [name], MD or one of the following Advanced Practice Providers on your designated Care Team:   Rosaria Ferries, PA-C . Jory Sims, DNP, ANP- Your physician recommends that you schedule a follow-up appointment in 3 MONTHS -April 2020  .   Any Other Special Instructions Will Be Listed Below (If Applicable).  You have been referred to see DR Ricard Dillon at [place]    Studies Ordered:   Orders Placed This Encounter  Procedures  . Ambulatory referral to Cardiothoracic Surgery  . EKG 12-Lead      [name], M.D., M.S. Interventional Cardiologist   Pager # [phone] Phone # [phone] [address] [place], [place] [address]   Thank you for choosing Heartcare at [facility]!!

## 2018-07-04 ENCOUNTER — Encounter: Payer: Self-pay | Admitting: Cardiology

## 2018-07-04 NOTE — Assessment & Plan Note (Signed)
Borderline pressures.  Really only on furosemide and Imdur.  Not really hypotensive, but should have room to add diltiazem which would be in lieu of beta-blocker.

## 2018-07-04 NOTE — Assessment & Plan Note (Addendum)
Again severe aortic insufficiency noted on echo.  It does appear that her EF is still preserved, and the LV is not overly dilated.  I do think there is going to become a point we need to decide whether not she would be a surgical candidate.  I have discussed referral to Dr. Roxy Manns.  (Unfortunately the time of the patient's visit, I had not seen the suggestion of proceeding with right and left heart cath and TEE prior to arranging a consultative visit). As such, we will contact her to discuss prior to CT surgical consultation having her come in for a TEE and since she is already had left heart cath we can just do the right heart cath.  If she is not interested, then we will await prior to referral.  The reason for initial consult will be consideration of whether she would be a potential candidate for valve surgery or not.  I am concerned with her history of COPD and lung cancer etc., that she may not be a very good option, especially since she is essentially wheelchair-bound.  However I do think that the aortic regurgitation will continue to progress and will potentially start leading to worsening of symptoms.  I do think with the extent of valvular disease, that she needs to ensure that she takes precautions for SBE prophylaxis.  This was discussed with the patient.

## 2018-07-04 NOTE — Assessment & Plan Note (Signed)
Now we will know only that she has severe COPD and history of lung cancer, she has just had a heart attack and has CAD.  Multiple reasons for stopping smoking, but she just does not seem to understand.  They talked about possibly trying Chantix, but she did not want to do that.  Her husband suggested considering hypnosis therapy, but was somewhat skeptical and therefore he feels like starting off with patches or Nicorette gum would probably the best option.  Unfortunately both need to quit in order for 1 of them to be successful.

## 2018-07-04 NOTE — Assessment & Plan Note (Signed)
Essentially single-vessel disease involving a severe RCA lesion there was a culprit lesion for MI, treated with DES stent.  Now on DAPT with aspirin and Plavix.  Also remains on Imdur and statin.  Reluctant to use beta-blocker because of her COPD, will start low-dose diltiazem for combination of antianginal effect as well as likely pulmonary pretension benefit

## 2018-07-04 NOTE — Assessment & Plan Note (Signed)
Actually just had lipids checked.  Crestor was relatively new and she has had a dramatic improvement in lipids since then based on her most recent labs.  We will simply continue Crestor at current dose.

## 2018-07-05 ENCOUNTER — Telehealth: Payer: Self-pay | Admitting: *Deleted

## 2018-07-05 DIAGNOSIS — Z01818 Encounter for other preprocedural examination: Secondary | ICD-10-CM

## 2018-07-05 DIAGNOSIS — R011 Cardiac murmur, unspecified: Secondary | ICD-10-CM

## 2018-07-05 DIAGNOSIS — I351 Nonrheumatic aortic (valve) insufficiency: Secondary | ICD-10-CM

## 2018-07-05 NOTE — Telephone Encounter (Signed)
RN SPOKE TO PATIENT. SHE IS AWARE  THAT  MORE TEST ARE NEEDED PRIOR TO APPOINTMENT WITH DR Ricard Dillon.  SHE IS IN AGREEMENT TO PROCEED WITH BOTH. SHE WOULD LIKE TO HAVE  BOTH PROCEDURES ON THE SAME DAY. PATIENT STATES SHE IS NOT AVAILABLE ANY FRIDAYS.  RN INFORMED PATIENT WEILL CONTACT HER WITH DETAILS OF  RIGHT HEART CATH AND TEE

## 2018-07-05 NOTE — Addendum Note (Signed)
Addended by: Raiford Simmonds on: 07/05/2018 06:13 PM   Modules accepted: Orders

## 2018-07-05 NOTE — Telephone Encounter (Signed)
Left message for patient to call back in regards to the below orders from Dr Ellyn Hack.  need to schedule RIght heart cath and TEE prior to appointment with Dr Tessa Lerner - I guess we need to touch base with her to discuss TEE & RHC. If we call on 1/10, I will addend this note & forward to [place].   [place]

## 2018-07-05 NOTE — Telephone Encounter (Signed)
SPOKE TO PATIENT - INSTRUCTION GIVEN AND PATIENT AND HUSBAND VIEWED INSTRUCTIONS ON MYCHART.  PATIENT AWARE TO COME TO OFFICE  FOR LABS VOICED UNDERSTANDING.

## 2018-07-08 DIAGNOSIS — I351 Nonrheumatic aortic (valve) insufficiency: Secondary | ICD-10-CM | POA: Diagnosis not present

## 2018-07-08 DIAGNOSIS — Z01818 Encounter for other preprocedural examination: Secondary | ICD-10-CM | POA: Diagnosis not present

## 2018-07-08 LAB — CBC
Hematocrit: 35.2 % (ref 34.0–46.6)
Hemoglobin: 12.1 g/dL (ref 11.1–15.9)
MCH: 32.7 pg (ref 26.6–33.0)
MCHC: 34.4 g/dL (ref 31.5–35.7)
MCV: 95 fL (ref 79–97)
Platelets: 268 10*3/uL (ref 150–450)
RBC: 3.7 x10E6/uL — ABNORMAL LOW (ref 3.77–5.28)
RDW: 12.5 % (ref 11.7–15.4)
WBC: 9.1 10*3/uL (ref 3.4–10.8)

## 2018-07-08 LAB — BASIC METABOLIC PANEL
BUN/Creatinine Ratio: 13 (ref 12–28)
BUN: 12 mg/dL (ref 8–27)
CO2: 26 mmol/L (ref 20–29)
Calcium: 9.1 mg/dL (ref 8.7–10.3)
Chloride: 102 mmol/L (ref 96–106)
Creatinine, Ser: 0.95 mg/dL (ref 0.57–1.00)
GFR calc Af Amer: 73 mL/min/{1.73_m2} (ref >59)
GFR calc non Af Amer: 63 mL/min/{1.73_m2} (ref >59)
Glucose: 76 mg/dL (ref 65–99)
Potassium: 4.5 mmol/L (ref 3.5–5.2)
Sodium: 144 mmol/L (ref 134–144)

## 2018-07-09 ENCOUNTER — Other Ambulatory Visit: Payer: Self-pay | Admitting: *Deleted

## 2018-07-09 DIAGNOSIS — I351 Nonrheumatic aortic (valve) insufficiency: Secondary | ICD-10-CM

## 2018-07-09 DIAGNOSIS — Z01818 Encounter for other preprocedural examination: Secondary | ICD-10-CM

## 2018-07-09 NOTE — Progress Notes (Signed)
ORDER PLACED FOR TEE AND RIGHT HEART CATH FOR 07/11/18

## 2018-07-10 ENCOUNTER — Telehealth: Payer: Self-pay | Admitting: *Deleted

## 2018-07-10 ENCOUNTER — Telehealth (HOSPITAL_COMMUNITY): Payer: Self-pay

## 2018-07-10 NOTE — Telephone Encounter (Signed)
Attempted to call patient in regards to Cardiac Rehab - LM on VM 

## 2018-07-10 NOTE — Telephone Encounter (Signed)
Pt insurance is active and benefits verified through [facility]. Co-pay $45.00, DED $0.00/$0.00 met, out of pocket $4,200.00/$303.69 met, co-insurance 0%. No pre-authorization required. Passport, 07/10/2018 @ 9:09AM, REF# [id number]  Will contact patient to see if she is interested in the Cardiac Rehab Program. If interested, patient will need to complete follow up appt. Once completed, patient will be contacted for scheduling upon review by the RN Navigator

## 2018-07-10 NOTE — Telephone Encounter (Signed)
Pt contacted pre-TEE/RHC scheduled at [facility] for: Thursday July 11, 2018 8 AM TEE/10:30 AM RHC Verified arrival time and place: [place] Entrance A at: 7AM  Nothing to eat or drink after midnight prior to procedures. Verified no diabetes medications.  Hold: Furosemide-AM of procedures  Except hold medications AM meds can be  taken pre-cath with sip of water including: ASA 81 mg Clopidogrel 75 mg  Confirmed patient has responsible person to drive home post procedure and for 24 hours after you arrive home: yes

## 2018-07-10 NOTE — Telephone Encounter (Signed)
Pt returned CR phone call and stated she does not think she will be able to complete CR because she is in a wheelchair and is unable to walk.  Closed referral

## 2018-07-11 ENCOUNTER — Ambulatory Visit (HOSPITAL_COMMUNITY)
Admission: RE | Disposition: A | Discharge status: Home / Self Care | Payer: Self-pay | Source: Home / Self Care | Attending: Internal Medicine

## 2018-07-11 ENCOUNTER — Encounter (HOSPITAL_COMMUNITY): Payer: Self-pay | Admitting: *Deleted

## 2018-07-11 ENCOUNTER — Encounter (HOSPITAL_COMMUNITY)
Admission: RE | Disposition: A | Discharge status: Home / Self Care | Payer: Self-pay | Source: Home / Self Care | Attending: Internal Medicine

## 2018-07-11 ENCOUNTER — Ambulatory Visit (HOSPITAL_COMMUNITY)
Admission: RE | Admit: 2018-07-11 | Discharge: 2018-07-11 | Disposition: A | Discharge status: Home / Self Care | Payer: Medicare HMO | Attending: Internal Medicine | Admitting: Internal Medicine

## 2018-07-11 ENCOUNTER — Other Ambulatory Visit: Payer: Self-pay

## 2018-07-11 ENCOUNTER — Ambulatory Visit (HOSPITAL_BASED_OUTPATIENT_CLINIC_OR_DEPARTMENT_OTHER)
Admission: RE | Admit: 2018-07-11 | Discharge: 2018-07-11 | Disposition: A | Discharge status: Home / Self Care | Payer: Medicare HMO | Source: Home / Self Care | Attending: Internal Medicine | Admitting: Internal Medicine

## 2018-07-11 DIAGNOSIS — I351 Nonrheumatic aortic (valve) insufficiency: Secondary | ICD-10-CM | POA: Diagnosis present

## 2018-07-11 DIAGNOSIS — Z8673 Personal history of transient ischemic attack (TIA), and cerebral infarction without residual deficits: Secondary | ICD-10-CM | POA: Diagnosis not present

## 2018-07-11 DIAGNOSIS — E039 Hypothyroidism, unspecified: Secondary | ICD-10-CM | POA: Insufficient documentation

## 2018-07-11 DIAGNOSIS — F17210 Nicotine dependence, cigarettes, uncomplicated: Secondary | ICD-10-CM | POA: Insufficient documentation

## 2018-07-11 DIAGNOSIS — I10 Essential (primary) hypertension: Secondary | ICD-10-CM | POA: Diagnosis not present

## 2018-07-11 DIAGNOSIS — Z7982 Long term (current) use of aspirin: Secondary | ICD-10-CM | POA: Diagnosis not present

## 2018-07-11 DIAGNOSIS — I061 Rheumatic aortic insufficiency: Secondary | ICD-10-CM | POA: Insufficient documentation

## 2018-07-11 DIAGNOSIS — Z885 Allergy status to narcotic agent status: Secondary | ICD-10-CM | POA: Diagnosis not present

## 2018-07-11 DIAGNOSIS — G8384 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postepileptic): Secondary | ICD-10-CM | POA: Diagnosis not present

## 2018-07-11 DIAGNOSIS — Z79890 Hormone replacement therapy (postmenopausal): Secondary | ICD-10-CM | POA: Diagnosis not present

## 2018-07-11 DIAGNOSIS — Z79899 Other long term (current) drug therapy: Secondary | ICD-10-CM | POA: Insufficient documentation

## 2018-07-11 DIAGNOSIS — Z01818 Encounter for other preprocedural examination: Secondary | ICD-10-CM

## 2018-07-11 DIAGNOSIS — Z880 Allergy status to penicillin: Secondary | ICD-10-CM | POA: Diagnosis not present

## 2018-07-11 DIAGNOSIS — E785 Hyperlipidemia, unspecified: Secondary | ICD-10-CM | POA: Insufficient documentation

## 2018-07-11 DIAGNOSIS — I252 Old myocardial infarction: Secondary | ICD-10-CM | POA: Insufficient documentation

## 2018-07-11 DIAGNOSIS — Z955 Presence of coronary angioplasty implant and graft: Secondary | ICD-10-CM | POA: Insufficient documentation

## 2018-07-11 DIAGNOSIS — Z7902 Long term (current) use of antithrombotics/antiplatelets: Secondary | ICD-10-CM | POA: Diagnosis not present

## 2018-07-11 DIAGNOSIS — K219 Gastro-esophageal reflux disease without esophagitis: Secondary | ICD-10-CM | POA: Diagnosis not present

## 2018-07-11 DIAGNOSIS — R569 Unspecified convulsions: Secondary | ICD-10-CM | POA: Diagnosis not present

## 2018-07-11 DIAGNOSIS — J449 Chronic obstructive pulmonary disease, unspecified: Secondary | ICD-10-CM | POA: Diagnosis not present

## 2018-07-11 DIAGNOSIS — Z8249 Family history of ischemic heart disease and other diseases of the circulatory system: Secondary | ICD-10-CM | POA: Diagnosis not present

## 2018-07-11 DIAGNOSIS — Z888 Allergy status to other drugs, medicaments and biological substances status: Secondary | ICD-10-CM | POA: Insufficient documentation

## 2018-07-11 DIAGNOSIS — R69 Illness, unspecified: Secondary | ICD-10-CM | POA: Diagnosis not present

## 2018-07-11 DIAGNOSIS — M170 Bilateral primary osteoarthritis of knee: Secondary | ICD-10-CM | POA: Diagnosis not present

## 2018-07-11 DIAGNOSIS — Z90710 Acquired absence of both cervix and uterus: Secondary | ICD-10-CM | POA: Diagnosis not present

## 2018-07-11 HISTORY — PX: RIGHT HEART CATH: CATH118263

## 2018-07-11 HISTORY — PX: TEE WITHOUT CARDIOVERSION: SHX5443

## 2018-07-11 LAB — POCT I-STAT 3, VENOUS BLOOD GAS (G3P V)
Acid-Base Excess: 4 mmol/L — ABNORMAL HIGH (ref 0.0–2.0)
Acid-Base Excess: 7 mmol/L — ABNORMAL HIGH (ref 0.0–2.0)
Bicarbonate: 30.4 mmol/L — ABNORMAL HIGH (ref 20.0–28.0)
Bicarbonate: 32.7 mmol/L — ABNORMAL HIGH (ref 20.0–28.0)
O2 Saturation: 52 %
O2 Saturation: 53 %
TCO2: 32 mmol/L (ref 22–32)
TCO2: 34 mmol/L — ABNORMAL HIGH (ref 22–32)
pCO2, Ven: 51.4 mmHg (ref 44.0–60.0)
pCO2, Ven: 53.4 mmHg (ref 44.0–60.0)
pH, Ven: 7.38 (ref 7.250–7.430)
pH, Ven: 7.395 (ref 7.250–7.430)
pO2, Ven: 29 mmHg — CL (ref 32.0–45.0)
pO2, Ven: 29 mmHg — CL (ref 32.0–45.0)

## 2018-07-11 SURGERY — RIGHT HEART CATH

## 2018-07-11 SURGERY — ECHOCARDIOGRAM, TRANSESOPHAGEAL
Anesthesia: Moderate Sedation

## 2018-07-11 MED ORDER — MIDAZOLAM HCL (PF) 5 MG/ML IJ SOLN
INTRAMUSCULAR | Status: AC
  Filled 2018-07-11: qty 2

## 2018-07-11 MED ORDER — LIDOCAINE HCL (PF) 1 % IJ SOLN
INTRAMUSCULAR | Status: DC | PRN
  Administered 2018-07-11: 2 mL

## 2018-07-11 MED ORDER — SODIUM CHLORIDE 0.9 % IV SOLN: 250.0000 mL | INTRAVENOUS | Status: DC | PRN

## 2018-07-11 MED ORDER — MIDAZOLAM HCL (PF) 10 MG/2ML IJ SOLN
INTRAMUSCULAR | Status: DC | PRN
  Administered 2018-07-11 (×3): 1 mg via INTRAVENOUS
  Administered 2018-07-11: .5 mg via INTRAVENOUS

## 2018-07-11 MED ORDER — FENTANYL CITRATE (PF) 100 MCG/2ML IJ SOLN
INTRAMUSCULAR | Status: AC
  Filled 2018-07-11: qty 2

## 2018-07-11 MED ORDER — FENTANYL CITRATE (PF) 100 MCG/2ML IJ SOLN
INTRAMUSCULAR | Status: DC | PRN
  Administered 2018-07-11: 25 ug via INTRAVENOUS

## 2018-07-11 MED ORDER — LIDOCAINE HCL (PF) 1 % IJ SOLN
INTRAMUSCULAR | Status: AC
  Filled 2018-07-11: qty 30

## 2018-07-11 MED ORDER — HEPARIN (PORCINE) IN NACL 1000-0.9 UT/500ML-% IV SOLN
INTRAVENOUS | Status: DC | PRN
  Administered 2018-07-11 (×2): 500 mL

## 2018-07-11 MED ORDER — SODIUM CHLORIDE 0.9% FLUSH: 3.0000 mL | INTRAVENOUS | Status: DC | PRN

## 2018-07-11 MED ORDER — BUTAMBEN-TETRACAINE-BENZOCAINE 2-2-14 % EX AERO
INHALATION_SPRAY | CUTANEOUS | Status: DC | PRN
  Administered 2018-07-11: 2 via TOPICAL

## 2018-07-11 MED ORDER — VERAPAMIL HCL 2.5 MG/ML IV SOLN
INTRAVENOUS | Status: AC
  Filled 2018-07-11: qty 2

## 2018-07-11 MED ORDER — SODIUM CHLORIDE 0.9% FLUSH: 3.0000 mL | Freq: Two times a day (BID) | INTRAVENOUS | Status: DC

## 2018-07-11 MED ORDER — SODIUM CHLORIDE 0.9 % IV SOLN: INTRAVENOUS | Status: DC

## 2018-07-11 MED ORDER — NITROGLYCERIN 1 MG/10 ML FOR IR/CATH LAB
INTRA_ARTERIAL | Status: AC
  Filled 2018-07-11: qty 10

## 2018-07-11 MED ORDER — HEPARIN (PORCINE) IN NACL 1000-0.9 UT/500ML-% IV SOLN
INTRAVENOUS | Status: AC
  Filled 2018-07-11: qty 1000

## 2018-07-11 MED ORDER — HEPARIN SODIUM (PORCINE) 1000 UNIT/ML IJ SOLN
INTRAMUSCULAR | Status: AC
  Filled 2018-07-11: qty 1

## 2018-07-11 SURGICAL SUPPLY — 5 items
CATH BALLN WEDGE 5F 110CM (CATHETERS) ×1 IMPLANT
KIT HEART LEFT (KITS) ×1 IMPLANT
PACK CARDIAC CATHETERIZATION (CUSTOM PROCEDURE TRAY) ×1 IMPLANT
SHEATH GLIDE SLENDER 4/5FR (SHEATH) ×1 IMPLANT
TRANSDUCER W/STOPCOCK (MISCELLANEOUS) ×1 IMPLANT

## 2018-07-11 NOTE — Interval H&P Note (Signed)
History and Physical Interval Note:  07/11/2018 7:54 AM  Kaylee Hansen  has presented today for surgery, with the diagnosis of SEVERE REGURGITION  The various methods of treatment have been discussed with the patient and family. After consideration of risks, benefits and other options for treatment, the patient has consented to  Procedure(s): TRANSESOPHAGEAL ECHOCARDIOGRAM (TEE) (N/A) as a surgical intervention .  The patient's history has been reviewed, patient examined, no change in status, stable for surgery.  I have reviewed the patient's chart and labs.  Questions were answered to the patient's satisfaction.     [name]

## 2018-07-11 NOTE — Progress Notes (Signed)
Endoscopy recovery completed after TEE. Patient remains in endoscopy at this time awaiting transfer to cath lab.

## 2018-07-11 NOTE — Discharge Instructions (Signed)
This sheet gives you information about how to care for yourself after your procedure. Your health care provider may also give you more specific instructions. If you have problems or questions, contact your health care provider. What can I expect after the procedure? After the procedure, it is common to have:  Bruising and tenderness at the catheter insertion area. Follow these instructions at home: Medicines  Take over-the-counter and prescription medicines only as told by your health care provider. Insertion site care  Follow instructions from your health care provider about how to take care of your insertion site. Make sure you: ? Wash your hands with soap and water before you change your bandage (dressing). If soap and water are not available, use hand sanitizer. ? Change your dressing as told by your health care provider. ? Leave stitches (sutures), skin glue, or adhesive strips in place. These skin closures may need to stay in place for 2 weeks or longer. If adhesive strip edges start to loosen and curl up, you may trim the loose edges. Do not remove adhesive strips completely unless your health care provider tells you to do that.  Check your insertion site every day for signs of infection. Check for: ? Redness, swelling, or pain. ? Fluid or blood. ? Pus or a bad smell. ? Warmth.  Do not take baths, swim, or use a hot tub until your health care provider approves.  You may shower 24-48 hours after the procedure, or as directed by your health care provider. ? Remove the dressing and gently wash the site with plain soap and water. ? Pat the area dry with a clean towel. ? Do not rub the site. That could cause bleeding.  Do not apply powder or lotion to the site. Activity   For 24 hours after the procedure, or as directed by your health care provider: ? Do not flex or bend the affected arm. ? Do not push or pull heavy objects with the affected arm. ? Do not drive yourself home  from the hospital or clinic. You may drive 24 hours after the procedure unless your health care provider tells you not to. ? Do not operate machinery or power tools.  Do not lift anything that is heavier than 10 lb (4.5 kg), or the limit that you are told, until your health care provider says that it is safe.  Ask your health care provider when it is okay to: ? Return to work or school. ? Resume usual physical activities or sports. ? Resume sexual activity. General instructions  If the catheter site starts to bleed, raise your arm and put firm pressure on the site. If the bleeding does not stop, get help right away. This is a medical emergency.  If you went home on the same day as your procedure, a responsible adult should be with you for the first 24 hours after you arrive home.  Keep all follow-up visits as told by your health care provider. This is important. Contact a health care provider if:  You have a fever.  You have redness, swelling, or yellow drainage around your insertion site. Get help right away if:  You have unusual pain at the radial site.  The catheter insertion area swells very fast.  The insertion area is bleeding, and the bleeding does not stop when you hold steady pressure on the area.  Your arm or hand becomes pale, cool, tingly, or numb. These symptoms may represent a serious problem that is an emergency.  Do not wait to see if the symptoms will go away. Get medical help right away. Call your local emergency services (911 in the [place] Do not drive yourself to the hospital. Summary  After the procedure, it is common to have bruising and tenderness at the site.  Follow instructions from your health care provider about how to take care of your radial site wound. Check the wound every day for signs of infection.  Do not lift anything that is heavier than 10 lb (4.5 kg), or the limit that you are told, until your health care provider says that it is safe. This  information is not intended to replace advice given to you by your health care provider. Make sure you discuss any questions you have with your health care provider. Document Released: 07/15/2010 Document Revised: 07/18/2017 Document Reviewed: 07/18/2017 Elsevier Interactive Patient Education  2019 [employer].

## 2018-07-11 NOTE — Interval H&P Note (Signed)
History and Physical Interval Note:  07/11/2018 12:21 PM  Kaylee Hansen  has presented today for surgery, with the diagnosis of Aortic regurg. The various methods of treatment have been discussed with the patient and family. After consideration of risks, benefits and other options for treatment, the patient has consented to  Procedure(s): RIGHT HEART CATH (N/A) as a surgical intervention .  The patient's history has been reviewed, patient examined, no change in status, stable for surgery.  I have reviewed the patient's chart and labs.  Questions were answered to the patient's satisfaction.     [name]

## 2018-07-11 NOTE — CV Procedure (Signed)
INDICATIONS: aortic valve regurgitation  PROCEDURE:   Informed consent was obtained prior to the procedure. The risks, benefits and alternatives for the procedure were discussed and the patient comprehended these risks.  Risks include, but are not limited to, cough, sore throat, vomiting, nausea, somnolence, esophageal and stomach trauma or perforation, bleeding, low blood pressure, aspiration, pneumonia, infection, trauma to the teeth and death.    After a procedural time-out, the oropharynx was anesthetized with 20% benzocaine spray.   During this procedure the patient was administered a total of Versed 3.5 mg and Fentanyl 25 mg to achieve and maintain moderate conscious sedation.  The patient's heart rate, blood pressure, and oxygen saturationweare monitored continuously during the procedure. The period of conscious sedation was 40 minutes, of which I was present face-to-face 100% of this time.  The transesophageal probe was inserted in the esophagus and stomach without difficulty and multiple views were obtained.  The patient was kept under observation until the patient left the procedure room.  The patient left the procedure room in stable condition.   Agitated microbubble saline contrast was administered.  COMPLICATIONS:    There were no immediate complications.  FINDINGS:  Severe aortic valve regurgitation (vena contracta >6.1, holodiastolic flow reversals in the descending aorta.) Central jet. 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 Mild mitral valve regurgitation. Normal biventricular function.  Possible tiny PFO with R to L shunt by agitated saline injection Moderate aortic atherosclerosis.  RECOMMENDATIONS:    Can proceed to cardiac catheterization.  Time Spent Directly with the Patient:  75 minutes   [name] 07/11/2018, 8:59 AM

## 2018-07-11 NOTE — Progress Notes (Signed)
  Echocardiogram Echocardiogram Transesophageal has been performed.  Jannett Celestine 07/11/2018, 9:11 AM

## 2018-07-12 ENCOUNTER — Encounter (HOSPITAL_COMMUNITY): Payer: Self-pay | Admitting: Cardiology

## 2018-07-12 MED FILL — Verapamil HCl IV Soln 2.5 MG/ML: INTRAVENOUS | Qty: 2 | Status: AC

## 2018-07-12 MED FILL — Nitroglycerin IV Soln 100 MCG/ML in D5W: INTRA_ARTERIAL | Qty: 10 | Status: AC

## 2018-07-12 MED FILL — Heparin Sodium (Porcine) Inj 1000 Unit/ML: INTRAMUSCULAR | Qty: 10 | Status: AC

## 2018-07-15 ENCOUNTER — Encounter (HOSPITAL_COMMUNITY): Payer: Self-pay | Admitting: Internal Medicine

## 2018-07-19 ENCOUNTER — Other Ambulatory Visit: Payer: Self-pay

## 2018-07-19 ENCOUNTER — Institutional Professional Consult (permissible substitution): Payer: Medicare HMO | Admitting: Thoracic Surgery (Cardiothoracic Vascular Surgery)

## 2018-07-19 ENCOUNTER — Encounter: Payer: Self-pay | Admitting: Thoracic Surgery (Cardiothoracic Vascular Surgery)

## 2018-07-19 VITALS — BP 108/45 | HR 84 | Resp 16 | Ht 65.0 in | Wt 150.0 lb

## 2018-07-19 DIAGNOSIS — I25119 Atherosclerotic heart disease of native coronary artery with unspecified angina pectoris: Secondary | ICD-10-CM

## 2018-07-19 DIAGNOSIS — C3490 Malignant neoplasm of unspecified part of unspecified bronchus or lung: Secondary | ICD-10-CM | POA: Diagnosis not present

## 2018-07-19 DIAGNOSIS — I351 Nonrheumatic aortic (valve) insufficiency: Secondary | ICD-10-CM

## 2018-07-19 NOTE — Progress Notes (Signed)
HEART AND [place] SURGERY CONSULTATION REPORT  Referring Provider is Leonie Man, MD PCP is Plotnikov, [name], MD  Chief Complaint  Patient presents with  . Aortic Insuffiency    new patient consultation, Cath/ TEE 07/11/2018, ECHO 06/13/2018    HPI:  Patient is a 63 year old female with complex past medical history and numerous comorbid medical problems who has been referred for surgical consultation to discuss treatment options for management of severe symptomatic aortic insufficiency.  The patient denies any history of rheumatic fever or rheumatic heart disease.  She has a longstanding history of heavy tobacco abuse and COPD.  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  She was treated with a combination of radiation therapy to the chest and brain and chemotherapy.  She responded remarkably well and subsequent MRIs of the brain revealed no evidence of metastatic disease.  She has not been followed for her lung cancer for many years, but she has remained free of symptomatic disease.  She developed progressive shortness of breath.  She also developed severe orthopedic problems with severe pain in her left hip and knee.  She underwent hip replacement but did poorly, she no longer can walk and has remained nonambulatory for at least 4 to 5 years.  She is wheelchair-bound and cannot walk more than 2 or 3 steps at a time without falling.  She describes chronic symptoms of shortness of breath.  She was found to have a murmur on physical exam and echocardiograms have documented the presence of severe aortic insufficiency with preserved left ventricular systolic function.  She has been followed for the past couple of years by Dr. Ellyn Hack.    Approximately 1 month ago she began to experience symptoms of chest discomfort.  Follow-up echocardiogram performed June 13, 2018  revealed severe aortic insufficiency with normal left ventricular systolic function with mild left ventricular chamber enlargement, maximum left ventricular end-diastolic diameter reported 5.5 cm.   Coronary CT angiography was performed June 22, 2017.  It was felt to be intermediate risk.  There was plaque in the proximal right coronary artery but FFR was reportedly negative.  The patient ultimately developed severe chest pain on June 28, 2018 and was hospitalized at which time she ruled in for an acute non-ST segment elevation myocardial infarction.  Catheterization revealed 90% proximal right coronary artery with mild nonobstructive disease in the left coronary circulation.  Left ventricular end-diastolic pressure was normal.  The patient underwent PCI and stenting of the proximal right coronary artery.  She recovered uneventfully and later underwent transesophageal echocardiogram confirming the presence of severe aortic insufficiency with preserved left ventricular systolic function.  Left heart chamber size was not reported.  Right heart catheterization was performed and notable for normal pulmonary artery pressures and cardiac output.  Cardiothoracic surgical consultation was requested.  Patient is married and lives locally in [place] with her husband.  She has been disabled for many years dating back to a reported history of collagenous colitis in the distant past.  The patient is severely debilitated because of her orthopedic problems.  She is entirely nonambulatory and spends her time in a wheelchair when she is not in bed.  She apparently is reportedly able to stand up for brief periods of time but cannot walk.  She has chronic pain and severe weakness in her legs.  She reports chronic symptoms of exertional shortness of breath.  She does not get short of breath at rest but she gets winded easily with activity.  She denies any history of PND, orthopnea, or lower extremity edema.  She has not had  any chest pain since her recent heart attack followed by PCI and stenting.  She continues to smoke cigarettes despite her history of lung cancer and longstanding shortness of breath.  She claims that she has tried to quit smoking on several previous occasions, but has been unsuccessful.  Past Medical History:  Diagnosis Date  . Anxiety   . Aortic insufficiency with aortic stenosis 04/2017   Echo with severe aortic insufficiency with mild aortic stenosis.  [name] CAD S/P percutaneous coronary angioplasty 11/2017   Proximal RCA PCI Synergy DES 3.5 mm x 16 mm (3.8 mm). Ost-mLM 30%. Ost-prox Cx 40%.  . Collagenous colitis   . Colon adenomas 2011  . Constipation    Chronic abdominal pain and constipation  . COPD (chronic obstructive pulmonary disease) ([place])   . Depression   . Diverticulosis   . Esophageal stricture 11/08/2012  . Esophageal ulcer    04/2009 EGD  . GERD (gastroesophageal reflux disease)   . Helicobacter pylori gastritis 2010   Pylera Tx  . History of cholecystectomy   . Hx of appendectomy   . Hx of cancer of lung 1999  . Hx of hysterectomy   . Hyperlipidemia   . Hypothyroidism   . Low back pain   . Non-STEMI (non-ST elevated myocardial infarction) ([place]) 11/2017   RCA PCI  . Osteoarthritis of knee    bilateral knee  . Seizures ([place])   . Stroke [facility])    CVA, hx of 97  . 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 ABDOMINAL HYSTERECTOMY     complete 1992  . ABDOMINAL SURGERY     Exploratory  . APPENDECTOMY    . BALLOON DILATION N/A 11/08/2012   Procedure: BALLOON DILATION;  Surgeon: Gatha Mayer, MD;  Location: [facility] ENDOSCOPY;  Service: Endoscopy;  Laterality: N/A;  . CHOLECYSTECTOMY    . COLONOSCOPY W/ BIOPSIES     multiple  . CORONARY STENT INTERVENTION N/A 06/28/2018   Procedure: CORONARY STENT INTERVENTION;  Surgeon: Martinique, Peter M, MD;  Location: [place] CV LAB;  Service: Cardiovascular;  Laterality: N/A;  90% prox RCA ->  PCI with Synergy DES 3.5 mm x 16 mm (3.8 mm).  . CT CTA CORONARY W/CA SCORE W/CM &/OR WO/CM  05/2017   Coronary calcium score 2.9. Intermediate risk. Unusual noncalcified plaque in RCA --FFR negative  . ESOPHAGOGASTRODUODENOSCOPY     w/baloon x 2  . ESOPHAGOGASTRODUODENOSCOPY N/A 11/08/2012   Procedure: ESOPHAGOGASTRODUODENOSCOPY (EGD);  Surgeon: Gatha Mayer, MD;  Location: [facility];  Service: Endoscopy;  Laterality: N/A;  . LEFT HEART CATH AND CORONARY ANGIOGRAPHY N/A 06/28/2018   Procedure: LEFT HEART CATH AND CORONARY ANGIOGRAPHY;  Surgeon: Martinique, Peter M, MD;  Location: [place] CV LAB;  Service: Cardiovascular:  90% prox RCA -> DES PCI. Ost-mLM 30%. Ost-prox Cx 40%.   EF 55-65%. LVEDP 15 mmHg.   [name] RIGHT HEART CATH N/A 07/11/2018   Procedure: RIGHT HEART CATH;  Surgeon: Leonie Man, MD;  Location: [place] CV LAB;  Service: Cardiovascular;  Laterality: N/A;  . TEE WITHOUT CARDIOVERSION N/A 07/11/2018   Procedure: TRANSESOPHAGEAL ECHOCARDIOGRAM (TEE);  Surgeon: [name], MD;  Location: [place];  Service: Cardiology;  Laterality: N/A;  . TOTAL HIP ARTHROPLASTY  Left  . TRANSTHORACIC ECHOCARDIOGRAM  04/2017   A) EF 60-65%. GR 1 DD. Mild AS with SEVERE AI;; B) Calcified aortic valve with mild stenosis and moderate to severe AI (visually appears severe, but holodiastolic flow reversal in the descending thoracic aorta not seen).  Mild to moderate MR.  EF 60 to 65% with GR 1 DD and no R WMA.  [name] TRANSTHORACIC ECHOCARDIOGRAM  05/2018   EF 60-65%.  No R WMA.  GR 1 DD.  Mild aortic stenosis with severe regurgitation.  Mild MR..  Only mild LV dilation noted.    Family History  Problem Relation Age of Onset  . Coronary artery disease Mother   . Diabetes Mother   . Hypertension Father   . Diabetes Son   . Coronary artery disease Other        grandmother, grandfather  . Kidney disease Other        aunt  . Kidney cancer Sister   . Colon cancer Neg Hx        colon      Social History   Socioeconomic History  . Marital status: Married    Spouse name: Not on file  . Number of children: 2  . Years of education: Not on file  . Highest education level: Not on file  Occupational History  . Occupation: disabled    Employer: DISABLED  Social Needs  . Financial resource strain: Not on file  . Food insecurity:    Worry: Not on file    Inability: Not on file  . Transportation needs:    Medical: Not on file    Non-medical: Not on file  Tobacco Use  . Smoking status: Current Every Day Smoker    Packs/day: 1.00    Years: 48.00    Pack years: 48.00    Types: Cigarettes  . Smokeless tobacco: Never Used  Substance and Sexual Activity  . Alcohol use: No  . Drug use: No  . Sexual activity: Not Currently  Lifestyle  . Physical activity:    Days per week: Not on file    Minutes per session: Not on file  . Stress: Not on file  Relationships  . Social connections:    Talks on phone: Not on file    Gets together: Not on file    Attends religious service: Not on file    Active member of club or organization: Not on file    Attends meetings of clubs or organizations: Not on file    Relationship status: Not on file  . Intimate partner violence:    Fear of current or ex partner: Not on file    Emotionally abused: Not on file    Physically abused: Not on file    Forced sexual activity: Not on file  Other Topics Concern  . Not on file  Social History Narrative   Married   2 children   No regular exercise          Current Outpatient Medications  Medication Sig Dispense Refill  . acetaminophen (TYLENOL) 325 MG tablet Take 2 tablets (650 mg total) by mouth every 4 (four) hours as needed for headache or mild pain.    [name] albuterol (PROVENTIL HFA;VENTOLIN HFA) 108 (90 Base) MCG/ACT inhaler Inhale 2 puffs into the lungs every 6 (six) hours as needed for wheezing. 1 Inhaler 5  . ALPRAZolam (XANAX) 0.25 MG tablet Take 1 tablet (0.25 mg total) by mouth  at bedtime as needed for anxiety. (Patient  taking differently: Take 0.25 mg by mouth at bedtime. ) 90 tablet 1  . aspirin EC 81 MG tablet Take 1 tablet (81 mg total) by mouth daily. 90 tablet 3  . budesonide-formoterol (SYMBICORT) 160-4.5 MCG/ACT inhaler Inhale 2 puffs into the lungs 2 (two) times daily at 10 AM and 5 PM. Rinse mouth after each use 1 Inhaler 0  . clopidogrel (PLAVIX) 75 MG tablet Take 1 tablet (75 mg total) by mouth daily with breakfast. 90 tablet 3  . diltiazem (CARDIZEM CD) 120 MG 24 hr capsule Take 1 capsule (120 mg total) by mouth at bedtime. 90 capsule 3  . furosemide (LASIX) 20 MG tablet TAKE 1 TABLET BY MOUTH ONCE DAILY 30 tablet 6  . guaiFENesin (MUCINEX) 600 MG 12 hr tablet Take 1 tablet (600 mg total) by mouth 2 (two) times daily as needed for cough or to loosen phlegm. 14 tablet 0  . HYDROcodone-acetaminophen (NORCO) 10-325 MG tablet Take 1 tablet by mouth every 6 (six) hours as needed for severe pain. Please fill on or after 07/20/18 (Patient taking differently: Take 1 tablet by mouth 2 (two) times daily. Please fill on or after 07/20/18) 120 tablet 0  . isosorbide mononitrate (IMDUR) 30 MG 24 hr tablet TAKE 1 TABLET BY MOUTH ONCE DAILY (Patient taking differently: Take 30 mg by mouth daily. ) 90 tablet 1  . levothyroxine (SYNTHROID, LEVOTHROID) 150 MCG tablet Take 1 tablet (150 mcg total) by mouth daily. 90 tablet 3  . nitroGLYCERIN (NITROSTAT) 0.4 MG SL tablet DISSOLVE ONE TABLET UNDER THE TONGUE EVERY 5 MINUTES AS NEEDED FOR CHEST PAIN.  DO NOT EXCEED A TOTAL OF 3 DOSES IN 15 MINUTES 25 tablet 0  . NON FORMULARY 2.5 lpm with sleep only    . PARoxetine (PAXIL) 40 MG tablet Take 1 tablet (40 mg total) by mouth daily. 90 tablet 3  . phenytoin (DILANTIN) 100 MG ER capsule TAKE 100 MG EVERY MORNING AND 200 MG EVERY NIGHT. (Patient taking differently: Take 100-200 mg by mouth See admin instructions. Take 1 capsule (100 mg) by mouth in the morning & take 2 capsules (200 mg) by  mouth at night.) 270 capsule 3  . promethazine (PHENERGAN) 25 MG tablet Take 1 tablet (25 mg total) by mouth every 8 (eight) hours as needed for nausea or vomiting. 90 tablet 1  . Respiratory Therapy Supplies (FLUTTER) DEVI As directed 1 each 0  . rosuvastatin (CRESTOR) 40 MG tablet Take 1 tablet (40 mg total) by mouth daily. Discontinue 20 mg dose (Patient taking differently: Take 40 mg by mouth daily. ) 90 tablet 3  . SUMAtriptan (IMITREX) 100 MG tablet TAKE 1 TABLET BY MOUTH EVERY 2 HOURS AS NEEDED FOR MIAGRAINE OR HEADACHE MAY REPEAT IN 2 HOURS (Patient taking differently: Take 100 mg by mouth every 2 (two) hours as needed for migraine. May repeat dose in 2 hours if no relief.  Do not exceed 2 doses in 24 hours.) 9 tablet 1  . traZODone (DESYREL) 100 MG tablet TAKE 1 TABLET BY MOUTH EVERYDAY AT BEDTIME (Patient taking differently: Take 100 mg by mouth at bedtime. ) 90 tablet 3  . Vitamin D, Ergocalciferol, (DRISDOL) 50000 units CAPS capsule TAKE ONE CAPSULE BY MOUTH ONCE A WEEK (Patient taking differently: Take 50,000 Units by mouth every Monday. ) 12 capsule 2   No current facility-administered medications for this visit.     Allergies  Allergen Reactions  . Morphine     "Makes me go crazy."   .  Nitrofuran Derivatives     ??confusion  . Oxycodone Hcl Other (See Comments)    Knocked her out for 6 days  . Penicillins Hives    Has patient had a PCN reaction causing immediate rash, facial/tongue/throat swelling, SOB or lightheadedness with hypotension: unknown Has patient had a PCN reaction causing severe rash involving mucus membranes or skin necrosis: unknown Has patient had a PCN reaction that required hospitalization No Has patient had a PCN reaction occurring within the last 10 years: No If all of the above answers are "NO", then may proceed with Cephalosporin use.   . Tape Other (See Comments)    Skin turns red and burns      Review of Systems:   General:  normal appetite,  decreased energy, no weight gain, no weight loss, no fever  Cardiac:  no chest pain with exertion, no chest pain at rest, +SOB with exertion, no resting SOB, no PND, no orthopnea, no palpitations, no arrhythmia, no atrial fibrillation, no LE edema, no dizzy spells, no syncope  Respiratory:  + shortness of breath, no home oxygen, no productive cough, no dry cough, no bronchitis, no wheezing, no hemoptysis, no asthma, no pain with inspiration or cough, no sleep apnea, no CPAP at night  GI:   no difficulty swallowing, no reflux, no frequent heartburn, + hiatal hernia, + abdominal pain, + constipation, no diarrhea, no hematochezia, no hematemesis, no melena  GU:   + dysuria,  + frequency, + urinary tract infection, no hematuria, no kidney stones, no kidney disease  Vascular:  no pain suggestive of claudication, no pain in feet, no leg cramps, no varicose veins, no DVT, no non-healing foot ulcer  Neuro:   + stroke, no TIA's, no seizures, no headaches, no temporary blindness one eye,  no slurred speech, + peripheral neuropathy, + chronic pain, + instability of gait, + memory/cognitive dysfunction  Musculoskeletal: + arthritis, + joint swelling, + myalgias, + difficulty walking, extremely poor mobility - wheelchair bound  Skin:   no rash, no itching, no skin infections, no pressure sores or ulcerations  Psych:   + anxiety, + depression, + nervousness, no unusual recent stress  Eyes:   + blurry vision, no floaters, no recent vision changes, + wears glasses or contacts  ENT:   no hearing loss, no loose or painful teeth, edentulous with full dentures  Hematologic:  no easy bruising, no abnormal bleeding, no clotting disorder, no frequent epistaxis  Endocrine:  no diabetes, does not check CBG's at home           Physical Exam:   BP (!) 108/45 (BP Location: Right Arm, Patient Position: Sitting, Cuff Size: Large)   Pulse 84   Resp 16   Ht 5' 5"  (1.651 m)   Wt 150 lb (68 kg)   SpO2 96% Comment: RA  BMI  24.96 kg/m   General:  debilitated-appearing female NAD  HEENT:  Unremarkable   Neck:   no JVD, no bruits, no adenopathy   Chest:   clear to auscultation, symmetrical breath sounds, no wheezes, no rhonchi   CV:   RRR, grade III/VI diastolicmurmur heard best at LLSB  Abdomen:  soft, non-tender, no masses   Extremities:  warm, well-perfused, pulses diminished, no LE edema  Rectal/GU  Deferred  Neuro:   Grossly non-focal and symmetrical throughout  Skin:   Clean and dry, no rashes, no breakdown   Diagnostic Tests:  Transthoracic Echocardiography  Patient:    Zada, Haser MR #:  [id number] Study Date: 06/13/2018 Gender:     F Age:        36 Height:     165.1 cm Weight:     67.6 kg BSA:        1.77 m^2 Pt. Status: Room:   Prichard, RDCS  ORDERING     [name], MD  [place], MD  PERFORMING   [facility], Outpatient  cc:  ------------------------------------------------------------------- LV EF: 60% -   65%  ------------------------------------------------------------------- Indications:      Chest pain (R07.9).  ------------------------------------------------------------------- History:   PMH:   Chronic obstructive pulmonary disease.  Risk factors:  Lung cancer. Anemia. Current tobacco use. Hypertension. Dyslipidemia.  ------------------------------------------------------------------- Study Conclusions  - Left ventricle: The cavity size was mildly dilated. Wall   thickness was normal. Systolic function was normal. The estimated   ejection fraction was in the range of 60% to 65%. Wall motion was   normal; there were no regional wall motion abnormalities. Doppler   parameters are consistent with abnormal left ventricular   relaxation (grade 1 diastolic dysfunction). Doppler parameters   are consistent with high ventricular filling pressure. - Aortic valve: There was mild stenosis. There was  severe   regurgitation. Mean gradient (S): 11 mm Hg. Peak gradient (S): 24   mm Hg. - Mitral valve: Calcified annulus. There was mild regurgitation.  Impressions:  - Normal LV systolic function; mild diastolic dysfunction; mild   LVE; severe AI; mild AS (mean gradient 11 mmHg); mild MR.  ------------------------------------------------------------------- Labs, prior tests, procedures, and surgery: Transthoracic echocardiography (11/02/2017).    The aortic valve showed mild stenosis and moderate to severe regurgitation.  EF was 60%. Aortic valve: peak gradient of 23 mm Hg and mean gradient of 11 mm Hg.  ------------------------------------------------------------------- Study data:  Strain imaging. Comparison was made to the study of 11/02/2017.  Study status:  Routine.  Procedure:  The patient reported no pain pre or post test. Transthoracic echocardiography. Image quality was adequate.  Study completion:  There were no complications.          Transthoracic echocardiography.  M-mode, complete 2D, spectral Doppler, and color Doppler.  Birthdate: Patient birthdate: 01-22-1956.  Age:  Patient is 63 yr old.  Sex: Gender: female.    BMI: 24.8 kg/m^2.  Blood pressure:     114/50 Patient status:  Outpatient.  Study date:  Study date: 06/13/2018. Study time: 11:13 AM.  Location:  [facility]  -------------------------------------------------------------------  ------------------------------------------------------------------- Left ventricle:  The cavity size was mildly dilated. Wall thickness was normal. Systolic function was normal. The estimated ejection fraction was in the range of 60% to 65%. Wall motion was normal; there were no regional wall motion abnormalities. Doppler parameters are consistent with abnormal left ventricular relaxation (grade 1 diastolic dysfunction). Doppler parameters are consistent with high ventricular filling  pressure.  ------------------------------------------------------------------- Aortic valve:   Trileaflet; moderately thickened leaflets. Doppler:   There was mild stenosis.   There was severe regurgitation.    VTI ratio of LVOT to aortic valve: 0.62. Valve area (VTI): 1.94 cm^2. Indexed valve area (VTI): 1.1 cm^2/m^2. Peak velocity ratio of LVOT to aortic valve: 0.62. Valve area (Vmax): 1.95 cm^2. Indexed valve area (Vmax): 1.1 cm^2/m^2. Mean velocity ratio of LVOT to aortic valve: 0.66. Valve area (Vmean): 2.07 cm^2. Indexed valve area (Vmean): 1.17 cm^2/m^2.    Mean gradient (S): 11 mm Hg. Peak gradient (S): 24 mm Hg.  -------------------------------------------------------------------  Aorta:  Aortic root: The aortic root was normal in size.  ------------------------------------------------------------------- Mitral valve:   Calcified annulus. Mobility was not restricted. Doppler:  Transvalvular velocity was within the normal range. There was no evidence for stenosis. There was mild regurgitation. Valve area by pressure half-time: 4.15 cm^2. Indexed valve area by pressure half-time: 2.34 cm^2/m^2.    Peak gradient (D): 5 mm Hg.   ------------------------------------------------------------------- Left atrium:  The atrium was normal in size.  ------------------------------------------------------------------- Right ventricle:  The cavity size was normal. Systolic function was normal.  ------------------------------------------------------------------- Pulmonic valve:    Doppler:  Transvalvular velocity was within the normal range. There was no evidence for stenosis.  ------------------------------------------------------------------- Tricuspid valve:   Structurally normal valve.    Doppler: Transvalvular velocity was within the normal range. There was trivial regurgitation.  ------------------------------------------------------------------- Right atrium:  The atrium  was normal in size.  ------------------------------------------------------------------- Pericardium:  There was no pericardial effusion.  ------------------------------------------------------------------- Measurements   Left ventricle                            Value          Reference  LV ID, ED, PLAX chordal           (H)     55    mm       43 - 52  LV ID, ES, PLAX chordal                   34    mm       23 - 38  LV fx shortening, PLAX chordal            38    %        >=29  LV PW thickness, ED                       9     mm       ---------  IVS/LV PW ratio, ED                       0.89           <=1.3  Stroke volume, 2D                         102   ml       ---------  Stroke volume/bsa, 2D                     58    ml/m^2   ---------  LV e&', lateral                            5.98  cm/s     ---------  LV E/e&', lateral                          18.39          ---------  LV e&', medial                             5.5   cm/s     ---------  LV E/e&', medial  20             ---------  LV e&', average                            5.74  cm/s     ---------  LV E/e&', average                          19.16          ---------    Ventricular septum                        Value          Reference  IVS thickness, ED                         8     mm       ---------    LVOT                                      Value          Reference  LVOT ID, S                                20    mm       ---------  LVOT area                                 3.14  cm^2     ---------  LVOT peak velocity, S                     153   cm/s     ---------  LVOT mean velocity, S                     97.6  cm/s     ---------  LVOT VTI, S                               32.6  cm       ---------  LVOT peak gradient, S                     9     mm Hg    ---------    Aortic valve                              Value          Reference  Aortic valve peak velocity, S             247   cm/s      ---------  Aortic valve mean velocity, S             148   cm/s     ---------  Aortic valve VTI, S                       52.9  cm       ---------  Aortic mean gradient, S                   11    mm Hg    ---------  Aortic peak gradient, S                   24    mm Hg    ---------  VTI ratio, LVOT/AV                        0.62           ---------  Aortic valve area, VTI                    1.94  cm^2     ---------  Aortic valve area/bsa, VTI                1.1   cm^2/m^2 ---------  Velocity ratio, peak, LVOT/AV             0.62           ---------  Aortic valve area, peak velocity          1.95  cm^2     ---------  Aortic valve area/bsa, peak               1.1   cm^2/m^2 ---------  velocity  Velocity ratio, mean, LVOT/AV             0.66           ---------  Aortic valve area, mean velocity          2.07  cm^2     ---------  Aortic valve area/bsa, mean               1.17  cm^2/m^2 ---------  velocity  Aortic regurg pressure half-time          327   ms       ---------    Aorta                                     Value          Reference  Aortic root ID, ED                        29    mm       ---------  Ascending aorta ID, A-P, S                32    mm       ---------    Left atrium                               Value          Reference  LA ID, A-P, ES                            35    mm       ---------  LA ID/bsa, A-P                            1.98  cm/m^2   <=2.2  LA volume, S  46.2  ml       ---------  LA volume/bsa, S                          26.1  ml/m^2   ---------  LA volume, ES, 1-p A4C                    43.9  ml       ---------  LA volume/bsa, ES, 1-p A4C                24.8  ml/m^2   ---------  LA volume, ES, 1-p A2C                    46.2  ml       ---------  LA volume/bsa, ES, 1-p A2C                26.1  ml/m^2   ---------    Mitral valve                              Value          Reference  Mitral E-wave peak velocity               110    cm/s     ---------  Mitral A-wave peak velocity               127   cm/s     ---------  Mitral deceleration time                  180   ms       150 - 230  Mitral pressure half-time                 53    ms       ---------  Mitral peak gradient, D                   5     mm Hg    ---------  Mitral E/A ratio, peak                    0.9            ---------  Mitral valve area, PHT, DP                4.15  cm^2     ---------  Mitral valve area/bsa, PHT, DP            2.34  cm^2/m^2 ---------    Systemic veins                            Value          Reference  Estimated CVP                             3     mm Hg    ---------    Right ventricle                           Value          Reference  TAPSE  22.2  mm       ---------  RV s&', lateral, S                         9.76  cm/s     ---------  Legend: (L)  and  (H)  mark values outside specified reference range.  ------------------------------------------------------------------- Prepared and Electronically Authenticated by  Kirk Ruths 2019-12-19T12:57:05   CORONARY STENT INTERVENTION  LEFT HEART CATH AND CORONARY ANGIOGRAPHY  Conclusion     Ost LM to Mid LM lesion is 30% stenosed.  Ost Cx to Prox Cx lesion is 40% stenosed.  Prox RCA lesion is 90% stenosed.  Post intervention, there is a 0% residual stenosis.  A drug-eluting stent was successfully placed using a STENT SYNERGY DES 3.5X16.  The left ventricular systolic function is normal.  LV end diastolic pressure is normal.  The left ventricular ejection fraction is 55-65% by visual estimate.   1. 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 Normal LV function 3. Normal LVEDP 15 mm Hg 4. Successful PCI of the proximal RCA with DES.   Plan: DAPT for one year. Anticipate DC in am.    Recommendations   Antiplatelet/Anticoag Recommend uninterrupted dual antiplatelet therapy with Aspirin 45m daily and  Clopidogrel 72mdaily for a minimum of 12 months (ACS-Class I recommendation).  Indications   Non-ST elevation (NSTEMI) myocardial infarction (HCPeters[I21.4 (ICD-10-CM)]  Procedural Details   Technical Details Indication: 6440o WF with NSTEMI  Procedural Details: The right wrist was prepped, draped, and anesthetized with 1% lidocaine. Ultrasound was used to guide access. Image was obtained and stored in the record. Using the modified Seldinger technique, a 6 French slender sheath was introduced into the right radial artery. 3 mg of verapamil was administered through the sheath, weight-based unfractionated heparin was administered intravenously. Standard Judkins catheters were used for selective coronary angiography and left ventriculography. Catheter exchanges were performed over an exchange length guidewire.  PCI Note: Following the diagnostic procedure, the decision was made to proceed with PCI. Weight-based heparin was given for anticoagulation. Plavix 600 mg was given orally. Once a therapeutic ACT was achieved, a 6 Fr[place]R4 guide catheter was inserted. A Sion blue coronary guidewire was used to cross the lesion. The lesion was predilated with a 2.5 mm balloon. The lesion was then stented with a 3.5 x 16 mm Synergy stent. The stent was postdilated with a 3.75 mm noncompliant balloon to 16 atm. Following PCI, there was 0% residual stenosis and TIMI-3 flow. Final angiography confirmed an excellent result. The patient tolerated the procedure well. There were no immediate procedural complications. A TR band was used for radial hemostasis. The patient was transferred to the post catheterization recovery area for further monitoring.  Contrast: 90 cc  Estimated blood loss <50 mL.   During this procedure medications were administered to achieve and maintain moderate conscious sedation while the patient's heart rate, blood pressure, and oxygen saturation were continuously monitored and I was present  face-to-face 100% of this time.  Medications  (Filter: Administrations occurring from 06/28/18 1547 to 06/28/18 1705)  Medication Rate/Dose/Volume Action  Date Time   fentaNYL (SUBLIMAZE) injection (mcg) 25 mcg Given 06/28/18 1605   Total dose as of 07/19/18 1602        25 mcg        midazolam (VERSED) injection (mg) 1 mg Given 06/28/18 1605   Total dose as of 07/19/18 1602  1 mg        lidocaine (PF) (XYLOCAINE) 1 % injection (mL) 2 mL Given 06/28/18 1608   Total dose as of 07/19/18 1602        2 mL        Heparin (Porcine) in NaCl 1000-0.9 UT/500ML-% SOLN (mL) 500 mL Given 06/28/18 1609   Total dose as of 07/19/18 1602 500 mL Given 1609   1,000 mL        Radial Cocktail/Verapamil only (mL) 10 mL Given 06/28/18 1610   Total dose as of 07/19/18 1602        10 mL        heparin injection (Units) 3,500 Units Given 06/28/18 1613   Total dose as of 07/19/18 1602 3,000 Units Given 1619   8,500 Units 2,000 Units Given 1632   clopidogrel (PLAVIX) tablet (mg) 600 mg Given 06/28/18 1622   Total dose as of 07/19/18 1602        600 mg        nitroGLYCERIN 1 mg/10 mL (100 mcg/mL) - IR/CATH LAB (mcg) 200 mcg Given 06/28/18 1634   Total dose as of 07/19/18 1602        200 mcg        iohexol (OMNIPAQUE) 350 MG/ML injection (mL) 90 mL Given 06/28/18 1646   Total dose as of 07/19/18 1602        90 mL        0.9 % sodium chloride infusion (mL/hr) 20 mL/hr New Bag/Given 06/28/18 1600   Dosing weight:  68 kg        Total dose as of 07/19/18 1602        Cannot be calculated        heparin bolus via infusion 4,000 Units (Units) *Not included in total [facility] Hold 06/28/18 1551   Dosing weight:  68 kg        Total dose as of 07/19/18 1602        Cannot be calculated        nitroGLYCERIN (NITROSTAT) SL tablet 0.4 mg (mg) *Not included in total MAR Hold 06/28/18 1551   Total dose as of 07/19/18 1602        Cannot be calculated        Sedation Time   Sedation Time Physician-1: 34 minutes 58  seconds  Complications   Complications documented before study signed (06/28/2018 5:06 PM EST)    No complications were associated with this study.  Documented by Martinique, Peter M, MD - 06/28/2018 4:49 PM EST    Coronary Findings   Diagnostic  Dominance: Right  Left Main  Ost LM to Mid LM lesion 30% stenosed  Ost LM to Mid LM lesion is 30% stenosed.  Left Anterior Descending  Vessel was injected. Vessel is normal in caliber. There is mild diffuse disease throughout the vessel.  Left Circumflex  Ost Cx to Prox Cx lesion 40% stenosed  Ost Cx to Prox Cx lesion is 40% stenosed.  Right Coronary Artery  Prox RCA lesion 90% stenosed  Prox RCA lesion is 90% stenosed. The lesion is discrete.  Intervention   Prox RCA lesion  Stent  [place] GUIDE 6FR JR4 guide catheter was inserted. Lesion crossed with guidewire using a WIRE SION BLUE 180. Pre-stent angioplasty was performed using a BALLOON SAPPHIRE 2.5X12. A drug-eluting stent was successfully placed using a STENT SYNERGY DES 3.5X16. Stent strut is well apposed. Post-stent angioplasty was performed using a BALLOON [place] EMERGE  MR [id number]. Maximum pressure: 16 atm.  Post-Intervention Lesion Assessment  The intervention was successful. Pre-interventional TIMI flow is 3. Post-intervention TIMI flow is 3. No complications occurred at this lesion.  There is a 0% residual stenosis post intervention.  Wall Motion   Resting               Left Heart   Left Ventricle The left ventricular size is normal. The left ventricular systolic function is normal. LV end diastolic pressure is normal. The left ventricular ejection fraction is 55-65% by visual estimate. No regional wall motion abnormalities.  Coronary Diagrams   Diagnostic  Dominance: Right    Intervention        Transesophageal Echocardiography  Patient:    Aldean, Suddeth MR #:       [id number] Study Date: 07/11/2018 Gender:     F Age:        40 Height:     165.1  cm Weight:     68 kg BSA:        1.78 m^2 Pt. Status: Room:   SONOGRAPHER  Jannett Celestine, RDCS  ADMITTING    Cherlynn Kaiser  ATTENDING    Cherlynn Kaiser  Frutoso Schatz, Gayatri  PERFORMING   Cherlynn Kaiser  Lana Fish  cc:  ------------------------------------------------------------------- LV EF: 60% -   65%  ------------------------------------------------------------------- History:   PMH:  Aortic Valve Disorder 424.1. Aortic Regurgitation Assessment.  Coronary artery disease.  Stroke.  Chronic obstructive pulmonary disease.  Risk factors:  NSTEMI. Dyslipidemia.  ------------------------------------------------------------------- Study Conclusions  - Left ventricle: Systolic function was normal. The estimated   ejection fraction was in the range of 60% to 65%. - Aortic valve: There was severe regurgitation. Severe   regurgitation is suggested by holodiastolic flow reversal in the   descending aorta and a vena contracta = 6 mm. - Aorta: Mild aortic atherosclerosis in the descending thoracic   aorta and aortic arch. - Mitral valve: There was trivial regurgitation. - Left atrium: No evidence of thrombus in the atrial cavity or   appendage. - Right ventricle: The cavity size was normal. Wall thickness was   normal. Systolic function was normal. - Right atrium: No evidence of thrombus in the atrial cavity or   appendage. - Atrial septum: There was a possible, very small patent foramen   ovale. - Tricuspid valve: There was trivial regurgitation.  Impressions:  - Severe aortic valve regurgitation, mechanism is likely due to   cusp thickening and restriction (Type III). Vena contracta is 7   mm, holodiastolic flow reversals seen in descending aorta. Normal   LV size and function.  ------------------------------------------------------------------- Study data:   Study status:  Routine.  Consent:  The risks, benefits, and  alternatives to the procedure were explained to the patient and informed consent was obtained.  Procedure:  The patient reported no pain pre or post test. Initial setup. The patient was brought to the laboratory. Surface ECG leads were monitored. Sedation. Conscious sedation was administered by cardiology staff. Transesophageal echocardiography. Topical anesthesia was obtained using viscous lidocaine. A transesophageal probe was inserted by the attending cardiologistwithout difficulty. Image quality was adequate.  Study completion:  The patient tolerated the procedure well. There were no complications.          Diagnostic transesophageal echocardiography.  2D and color Doppler. Birthdate:  Patient birthdate: 06-26-1956.  Age:  Patient is 63 yr old.  Sex:  Gender: female.    BMI:  25 kg/m^2.  Blood pressure: 124/33  Study date:  Study date: 07/11/2018. Study time: 08:15 AM. Location:  Endoscopy.  -------------------------------------------------------------------  ------------------------------------------------------------------- Left ventricle:  Systolic function was normal. The estimated ejection fraction was in the range of 60% to 65%.  ------------------------------------------------------------------- Aortic valve:   Moderately thickened leaflets.  Doppler:  There was severe regurgitation. Severe regurgitation is suggested by holodiastolic flow reversal in the descending aorta and a vena contracta = 6 mm.    Mean gradient (S): 10 mm Hg. Peak gradient (S): 21 mm Hg.  ------------------------------------------------------------------- Aorta:  The aorta was not dilated. Mild aortic atherosclerosis in the descending thoracic aorta and aortic arch.  ------------------------------------------------------------------- Mitral valve:   Mildly thickened leaflets . Leaflet separation was normal.  Doppler:  There was trivial  regurgitation.  ------------------------------------------------------------------- Left atrium:  The atrium was normal in size.  No evidence of thrombus in the atrial cavity or appendage. The appendage was well visualized and of normal size.  ------------------------------------------------------------------- Atrial septum:  There was a possible, very small patent foramen ovale.  ------------------------------------------------------------------- Right ventricle:  The cavity size was normal. Wall thickness was normal. Systolic function was normal.  ------------------------------------------------------------------- Pulmonic valve:    Structurally normal valve.   Cusp separation was normal.  Doppler:  There was trivial regurgitation.  ------------------------------------------------------------------- Tricuspid valve:   Structurally normal valve.   Leaflet separation was normal.  No evidence of vegetation.  Doppler:  There was trivial regurgitation.  ------------------------------------------------------------------- Pulmonary artery:   The main pulmonary artery was normal-sized.  ------------------------------------------------------------------- Right atrium:  The atrium was normal in size.  No evidence of thrombus in the atrial cavity or appendage.  ------------------------------------------------------------------- Pericardium:  The pericardium was normal in appearance. There was no pericardial effusion.  ------------------------------------------------------------------- Post procedure conclusions Ascending Aorta:  - The aorta was not dilated. Mild aortic atherosclerosis in the   descending thoracic aorta and aortic arch.  ------------------------------------------------------------------- Measurements   Left ventricle                       Value        Reference  LV ejection time                     320    ms    ---------    Aortic valve                          Value        Reference  Aortic valve peak velocity, S        228.89 cm/s  ---------  Aortic valve mean velocity, S        141.23 cm/s  ---------  Aortic valve VTI, S                  45.13  cm    ---------  Aortic mean gradient, S              10     mm Hg ---------  Aortic peak gradient, S              21     mm Hg ---------  Aortic regurg pressure half-time     320    ms    ---------    Aorta  Value        Reference  Aortic root ID, ED                   31.23  mm    ---------  Ascending aorta ID, A-P, mid, ED     31.58  mm    21 - 34  Legend: (L)  and  (H)  mark values outside specified reference range.  ------------------------------------------------------------------- Prepared and Electronically Authenticated by  Cherlynn Kaiser 2020-01-16T15:42:29   RIGHT HEART CATH  Conclusion     Normal Right Heart Cath Numbers  Severely reduced cardiac output and index by Fick   SUMMARY  Relatively normal Right Heart Cath pressures: PA pressure 26/14 mmHg-mean 20 mmHg; PCWP 13 mmHg.  RAP 6 mmHg, RVP 28/3 mmHg-EDP 10 mmHg.  Severely reduced cardiac output and index by Fick: 3.63 and 2.07. Despite normal EF on echocardiogram, cardiac output is low, likely related to valvular disease.   RECOMMENDATION  Discharge home after bedrest  Continue with referral to Dr. Roxy Manns.  Continue to titrate medications for "diastolic heart failure/valvular cardiomyopathy "       Impression:  Patient has stage D severe symptomatic aortic insufficiency.  She describes stable symptoms of exertional shortness of breath consistent with chronic diastolic congestive heart failure.  However, her symptoms are also likely at least partially related to underlying COPD with ongoing tobacco abuse and severe physical deconditioning.  She also has multivessel coronary artery disease and recently suffered an acute non-ST segment elevation myocardial infarction  treated with PCI and stenting of the right coronary artery.  She has not had significant symptoms of recurrent chest pain since she underwent PCI and stenting of the proximal right coronary artery.  Catheterization performed at that time revealed nonobstructive disease in the left coronary circulation.  I have personally reviewed the patient's recent transthoracic and transesophageal echocardiograms and diagnostic cardiac catheterization.  Echocardiogram reveals severe aortic insufficiency with mild aortic stenosis.  The aortic valve is trileaflet.  There is moderate thickening of the leaflets with incomplete central coaptation, consistent with underlying rheumatic disease or possible radiation induced valvulopathy.  The left ventricle is only mildly dilated with end-diastolic diameter less than 6 cm.  Left ventricular systolic function remains preserved.  Catheterization revealed high-grade proximal stenosis of the right coronary artery which was successfully treated with PCI and stenting.  The patient otherwise has nonobstructive disease in the remainder of her coronary arteries.  Right heart pressures were normal.  The patient remains symptomatic, although it is difficult to sort out how much of her shortness of breath is related to chronic diastolic heart failure versus COPD with ongoing tobacco abuse and severe physical debilitation.  In either case I suspect she would likely benefit from aortic valve replacement, and if left untreated she will unquestionably develop progressive left ventricular chamber enlargement and worsening congestive heart failure.  However, risks associated with conventional surgical aortic valve replacement would be extremely high because of the patient's numerous and extreme comorbid medical problems.    Plan:  I have discussed the nature of the patient's underlying heart disease including aortic insufficiency at length with the patient and her husband in the office today we  discussed the natural history of severe aortic insufficiency and current available options for treatment.  We discussed the patient's recent acute myocardial infarction and questions regarding timing of possible surgical intervention for management of aortic insufficiency.  The very high risks associated with conventional surgery were discussed as well as  concerns regarding the patient's postoperative convalescence.  We discussed the development of transcatheter aortic valve replacement and current limitations in the setting of patients with primarily severe aortic insufficiency.  All of the questions have been addressed.  At this point the patient and her husband desire to wait and see how she does on medical therapy as she continues to recover from her recent acute myocardial infarction.  Under the circumstances this seems quite reasonable.  She will need close follow-up to include at the very minimum annual follow-up echocardiography and clinic evaluation every 6 months.  If symptoms progress or she develops evidence of left ventricular chamber enlargement or decreased left ventricular function, surgical intervention could be reconsidered.   Under the circumstances I would favor follow-up pulmonary function testing both with and without bronchodilator therapy and diffusion capacity to better qualify quantify the severity of the patient's underlying chronic lung disease.  CT angiography could be performed to consider whether or not transcatheter aortic valve replacement could be considered as a less invasive option (if available).  The patient will continue to follow-up with Dr. Ellyn Hack and return to our office as needed.    The patient has strongly been advised to find a way to quit smoking.  All their questions have been addressed.   I spent in excess of 90 minutes during the conduct of this office consultation and >50% of this time involved direct face-to-face encounter with the patient for counseling  and/or coordination of their care.   Valentina Gu. Roxy Manns, MD 07/19/2018 12:52 PM

## 2018-07-19 NOTE — Patient Instructions (Signed)
Continue all previous medications without any changes at this time  Stop smoking immediately and permanently.

## 2018-07-22 ENCOUNTER — Encounter: Payer: Medicare HMO | Admitting: Thoracic Surgery (Cardiothoracic Vascular Surgery)

## 2018-07-22 ENCOUNTER — Other Ambulatory Visit: Payer: Self-pay | Admitting: Cardiology

## 2018-08-05 ENCOUNTER — Other Ambulatory Visit: Payer: Self-pay | Admitting: Cardiology

## 2018-08-19 DIAGNOSIS — C3490 Malignant neoplasm of unspecified part of unspecified bronchus or lung: Secondary | ICD-10-CM | POA: Diagnosis not present

## 2018-09-02 DIAGNOSIS — I509 Heart failure, unspecified: Secondary | ICD-10-CM | POA: Diagnosis not present

## 2018-09-02 DIAGNOSIS — E559 Vitamin D deficiency, unspecified: Secondary | ICD-10-CM | POA: Diagnosis not present

## 2018-09-02 DIAGNOSIS — J449 Chronic obstructive pulmonary disease, unspecified: Secondary | ICD-10-CM | POA: Diagnosis not present

## 2018-09-02 DIAGNOSIS — E039 Hypothyroidism, unspecified: Secondary | ICD-10-CM | POA: Diagnosis not present

## 2018-09-02 DIAGNOSIS — R569 Unspecified convulsions: Secondary | ICD-10-CM | POA: Diagnosis not present

## 2018-09-02 DIAGNOSIS — I69354 Hemiplegia and hemiparesis following cerebral infarction affecting left non-dominant side: Secondary | ICD-10-CM | POA: Diagnosis not present

## 2018-09-02 DIAGNOSIS — I25119 Atherosclerotic heart disease of native coronary artery with unspecified angina pectoris: Secondary | ICD-10-CM | POA: Diagnosis not present

## 2018-09-02 DIAGNOSIS — J439 Emphysema, unspecified: Secondary | ICD-10-CM | POA: Diagnosis not present

## 2018-09-02 DIAGNOSIS — E785 Hyperlipidemia, unspecified: Secondary | ICD-10-CM | POA: Diagnosis not present

## 2018-09-02 DIAGNOSIS — R69 Illness, unspecified: Secondary | ICD-10-CM | POA: Diagnosis not present

## 2018-09-09 ENCOUNTER — Other Ambulatory Visit: Payer: Self-pay | Admitting: Internal Medicine

## 2018-09-09 DIAGNOSIS — J449 Chronic obstructive pulmonary disease, unspecified: Secondary | ICD-10-CM

## 2018-09-11 ENCOUNTER — Telehealth: Payer: Self-pay

## 2018-09-11 NOTE — Telephone Encounter (Signed)
Covid-19 travel screening questions  Have you traveled in the last 14 days? NO If yes where?  Do you now or have you had a fever in the last 14 days? NO  Do you have any respiratory symptoms of shortness of breath or cough now or in the last 14 days? YES has COPD    Do you have any family members or close contacts with diagnosed or suspected Covid-19? NO

## 2018-09-12 ENCOUNTER — Other Ambulatory Visit (INDEPENDENT_AMBULATORY_CARE_PROVIDER_SITE_OTHER): Payer: Medicare HMO

## 2018-09-12 ENCOUNTER — Ambulatory Visit: Payer: Medicare HMO | Admitting: Internal Medicine

## 2018-09-12 ENCOUNTER — Other Ambulatory Visit: Payer: Self-pay

## 2018-09-12 ENCOUNTER — Encounter: Payer: Self-pay | Admitting: Internal Medicine

## 2018-09-12 VITALS — BP 96/50 | HR 71 | Temp 98.4°F | Ht 64.0 in | Wt 153.1 lb

## 2018-09-12 DIAGNOSIS — K625 Hemorrhage of anus and rectum: Secondary | ICD-10-CM | POA: Diagnosis not present

## 2018-09-12 DIAGNOSIS — K5909 Other constipation: Secondary | ICD-10-CM

## 2018-09-12 DIAGNOSIS — K222 Esophageal obstruction: Secondary | ICD-10-CM | POA: Diagnosis not present

## 2018-09-12 DIAGNOSIS — R1084 Generalized abdominal pain: Secondary | ICD-10-CM

## 2018-09-12 DIAGNOSIS — R1310 Dysphagia, unspecified: Secondary | ICD-10-CM | POA: Diagnosis not present

## 2018-09-12 DIAGNOSIS — R1319 Other dysphagia: Secondary | ICD-10-CM

## 2018-09-12 LAB — CBC WITH DIFFERENTIAL/PLATELET
Basophils Absolute: 0.1 10*3/uL (ref 0.0–0.1)
Basophils Relative: 1.3 % (ref 0.0–3.0)
Eosinophils Absolute: 0.3 10*3/uL (ref 0.0–0.7)
Eosinophils Relative: 3.7 % (ref 0.0–5.0)
HCT: 35.6 % — ABNORMAL LOW (ref 36.0–46.0)
Hemoglobin: 12 g/dL (ref 12.0–15.0)
Lymphocytes Relative: 17.8 % (ref 12.0–46.0)
Lymphs Abs: 1.3 10*3/uL (ref 0.7–4.0)
MCHC: 33.6 g/dL (ref 30.0–36.0)
MCV: 97.5 fl (ref 78.0–100.0)
Monocytes Absolute: 0.6 10*3/uL (ref 0.1–1.0)
Monocytes Relative: 8.5 % (ref 3.0–12.0)
Neutro Abs: 4.8 10*3/uL (ref 1.4–7.7)
Neutrophils Relative %: 68.7 % (ref 43.0–77.0)
Platelets: 282 10*3/uL (ref 150.0–400.0)
RBC: 3.65 Mil/uL — ABNORMAL LOW (ref 3.87–5.11)
RDW: 13.8 % (ref 11.5–15.5)
WBC: 7 10*3/uL (ref 4.0–10.5)

## 2018-09-12 MED ORDER — PANTOPRAZOLE SODIUM 40 MG PO TBEC: 40.0000 mg | DELAYED_RELEASE_TABLET | Freq: Every day | ORAL | 3 refills | Status: DC

## 2018-09-12 NOTE — Progress Notes (Signed)
Ms. Wegener,  Blood count is ok.  I hope the treatments I recommended make a difference.  See you in April,  Best regards,  Gatha Mayer, MD, [facility]

## 2018-09-12 NOTE — Patient Instructions (Addendum)
  Dr Carlean Purl recommends that you complete a bowel purge (to clean out your bowels). Please do the following: Purchase a bottle of Miralax over the counter as well as a box of 5 mg dulcolax tablets. Take 4 dulcolax tablets. Wait 1 hour. You will then drink 6-8 capfuls of Miralax mixed in an adequate amount of water/juice/gatorade (you may choose which of these liquids to drink) over the next 2-3 hours. You should expect results within 1 to 6 hours after completing the bowel purge.  After the purge take one dose daily of the Miralax and also take one to two Dulcolax every other day.   Your provider has requested that you go to the basement level for lab work before leaving today. Press "B" on the elevator. The lab is located at the first door on the left as you exit the elevator.   We are giving you a dysphagia diet to follow , use Step #3.   Continue your Pantoprazole.   Follow up with Dr Carlean Purl on April 30th at 10:00AM.  I appreciate the opportunity to care for you. Silvano Rusk, MD, [facility]

## 2018-09-12 NOTE — Progress Notes (Signed)
Kaylee Hansen 63 y.o. 13-Jun-1956 [id number]  Assessment & Plan:   Encounter Diagnoses  Name Primary?  . Chronic constipation Yes  . Generalized abdominal pain   . Rectal bleeding   . Esophageal dysphagia   . Esophageal stricture     CBC MiraLAx purge then daily MiraLax and bisacodyl 1-2 qod Restart pantoprazole 40 mg qd Dysphagia 3 diet Reassess in late April  She has extensive severe cardiac and pulmonary co-morbitities that would make me hesitant to perform endoscopic evaluation at any time tough it could come to that, but with COVID-19 pandemic right now I think extra important to minimize her risks ao will treat conservatively at this time.  She and her husband agree with this approach.  HB:[id number], Kaylee Lacks, MD   Subjective:   Chief Complaint: Constipation and abdominal pain  HPI The patient is here with her husband and complains of chronic persistent constipation, intermittent rectal bleeding and generalized but especially lower quadrant abdominal pain.  She is also nauseous and has intermittent dysphagia.  She has a long GI history that dates back to years ago in the 1990s when she was diagnosed with collagenous colitis and had diarrhea but over the year shifted towards a chronic constipation problem.  This is really not necessarily worse she is just tired of dealing with it and feeling poorly.  Occasional bright or dark or red-colored stools when she has a "blowout".  Linzess was tried and did not help.  Dulcolax will work and she is taking that once a week.  She had an esophageal stricture dilated in 2014 with a Maloney dilator by me she was on a PPI but sometime in the last several months she decided to stop her pantoprazole.  So she is having intermittent dysphagia to solid foods at times.  Denies significant heartburn symptoms.  She also has a history of adenomatous colon polyps with last colonoscopy 2011 with 4 subcentimeter adenomas.  She has severe significant  comorbidities, uses oxygen at night, has bad COPD and continues to smoke.  Recently saw Dr. Roxy Manns for consideration of aortic valve repair in the setting of significant aortic insufficiency.  She and her husband decided to observe.  She cannot walk without holding onto something or someone.  Spends most of her time in a wheelchair.  Also has chronic cough and dyspnea.  Multiple joint pains.  Headaches are increasing at this time.  She takes Plavix, she takes hydrocodone chronically. Allergies  Allergen Reactions  . Morphine     "Makes me go crazy."   . Nitrofuran Derivatives     ??confusion  . Oxycodone Hcl Other (See Comments)    Knocked her out for 6 days  . Penicillins Hives    Has patient had a PCN reaction causing immediate rash, facial/tongue/throat swelling, SOB or lightheadedness with hypotension: unknown Has patient had a PCN reaction causing severe rash involving mucus membranes or skin necrosis: unknown Has patient had a PCN reaction that required hospitalization No Has patient had a PCN reaction occurring within the last 10 years: No If all of the above answers are "NO", then may proceed with Cephalosporin use.   . Tape Other (See Comments)    Skin turns red and burns   Current Meds  Medication Sig  . acetaminophen (TYLENOL) 325 MG tablet Take 2 tablets (650 mg total) by mouth every 4 (four) hours as needed for headache or mild pain.  [name] albuterol (PROVENTIL HFA;VENTOLIN HFA) 108 (90 Base) MCG/ACT inhaler Inhale  2 puffs into the lungs every 6 (six) hours as needed for wheezing.  [name] ALPRAZolam (XANAX) 0.25 MG tablet Take 1 tablet (0.25 mg total) by mouth at bedtime as needed for anxiety. (Patient taking differently: Take 0.25 mg by mouth at bedtime. )  . aspirin EC 81 MG tablet Take 1 tablet (81 mg total) by mouth daily.  . budesonide-formoterol (SYMBICORT) 160-4.5 MCG/ACT inhaler Inhale 2 puffs into the lungs 2 (two) times daily at 10 AM and 5 PM. Rinse mouth after each use  .  clopidogrel (PLAVIX) 75 MG tablet Take 1 tablet (75 mg total) by mouth daily with breakfast.  . diltiazem (CARDIZEM CD) 120 MG 24 hr capsule Take 1 capsule (120 mg total) by mouth at bedtime.  . furosemide (LASIX) 20 MG tablet TAKE 1 TABLET BY MOUTH ONCE DAILY  . guaiFENesin (MUCINEX) 600 MG 12 hr tablet Take 1 tablet (600 mg total) by mouth 2 (two) times daily as needed for cough or to loosen phlegm.  [name] HYDROcodone-acetaminophen (NORCO) 10-325 MG tablet Take 1 tablet by mouth every 6 (six) hours as needed for severe pain. Please fill on or after 07/20/18 (Patient taking differently: Take 1 tablet by mouth 2 (two) times daily. Please fill on or after 07/20/18)  . isosorbide mononitrate (IMDUR) 30 MG 24 hr tablet TAKE 1 TABLET BY MOUTH ONCE DAILY  . levothyroxine (SYNTHROID, LEVOTHROID) 150 MCG tablet Take 1 tablet (150 mcg total) by mouth daily.  . nitroGLYCERIN (NITROSTAT) 0.4 MG SL tablet DISSOLVE ONE TABLET UNDER THE TONGUE EVERY 5 MINUTES AS NEEDED FOR CHEST PAIN.  DO NOT EXCEED A TOTAL OF 3 DOSES IN 15 MINUTES  . OXYGEN Inhale 2.5 L/min into the lungs at bedtime.  [name] PARoxetine (PAXIL) 40 MG tablet Take 1 tablet (40 mg total) by mouth daily.  . phenytoin (DILANTIN) 100 MG ER capsule TAKE 100 MG EVERY MORNING AND 200 MG EVERY NIGHT. (Patient taking differently: Take 100-200 mg by mouth See admin instructions. Take 1 capsule (100 mg) by mouth in the morning & take 2 capsules (200 mg) by mouth at night.)  . promethazine (PHENERGAN) 25 MG tablet Take 1 tablet (25 mg total) by mouth every 8 (eight) hours as needed for nausea or vomiting.  [name] Respiratory Therapy Supplies (FLUTTER) DEVI As directed  . rosuvastatin (CRESTOR) 40 MG tablet Take 1 tablet (40 mg total) by mouth daily. Discontinue 20 mg dose (Patient taking differently: Take 40 mg by mouth daily. )  . SUMAtriptan (IMITREX) 100 MG tablet TAKE 1 TABLET BY MOUTH EVERY 2 HOURS AS NEEDED FOR MIAGRAINE OR HEADACHE MAY REPEAT IN 2 HOURS (Patient taking  differently: Take 100 mg by mouth every 2 (two) hours as needed for migraine. May repeat dose in 2 hours if no relief.  Do not exceed 2 doses in 24 hours.)  . traZODone (DESYREL) 100 MG tablet TAKE 1 TABLET BY MOUTH EVERYDAY AT BEDTIME (Patient taking differently: Take 100 mg by mouth at bedtime. )  . Vitamin D, Ergocalciferol, (DRISDOL) 50000 units CAPS capsule TAKE ONE CAPSULE BY MOUTH ONCE A WEEK (Patient taking differently: Take 50,000 Units by mouth every Monday. )   Past Medical History:  Diagnosis Date  . Anxiety   . Aortic insufficiency with aortic stenosis 04/2017   Echo with severe aortic insufficiency with mild aortic stenosis.  [name] CAD S/P percutaneous coronary angioplasty 11/2017   Proximal RCA PCI Synergy DES 3.5 mm x 16 mm (3.8 mm). Ost-mLM 30%. Ost-prox Cx 40%.  [name]  Collagenous colitis   . Colon adenomas 2011  . Constipation    Chronic abdominal pain and constipation  . COPD (chronic obstructive pulmonary disease) ([place])   . Depression   . Diverticulosis   . Esophageal stricture 11/08/2012  . Esophageal ulcer    04/2009 EGD  . GERD (gastroesophageal reflux disease)   . Helicobacter pylori gastritis 2010   Pylera Tx  . History of cholecystectomy   . Hx of appendectomy   . Hx of cancer of lung 1999  . Hx of hysterectomy   . Hyperlipidemia   . Hypothyroidism   . Low back pain   . Non-STEMI (non-ST elevated myocardial infarction) ([place]) 11/2017   RCA PCI  . Osteoarthritis of knee    bilateral knee  . Seizures ([place])   . Stroke [facility])    CVA, hx of 97  . 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 ABDOMINAL HYSTERECTOMY     complete 1992  . ABDOMINAL SURGERY     Exploratory  . APPENDECTOMY    . BALLOON DILATION N/A 11/08/2012   Procedure: BALLOON DILATION;  Surgeon: Gatha Mayer, MD;  Location: [facility];  Service: Endoscopy;  Laterality: N/A;  . CHOLECYSTECTOMY    . COLONOSCOPY W/ BIOPSIES     multiple  . CORONARY STENT  INTERVENTION N/A 06/28/2018   Procedure: CORONARY STENT INTERVENTION;  Surgeon: Martinique, Peter M, MD;  Location: [place] [facility];  Service: Cardiovascular;  Laterality: N/A;  90% prox RCA -> PCI with Synergy DES 3.5 mm x 16 mm (3.8 mm).  . CT CTA CORONARY W/CA SCORE W/CM &/OR WO/CM  05/2017   Coronary calcium score 2.9. Intermediate risk. Unusual noncalcified plaque in RCA --FFR negative  . ESOPHAGOGASTRODUODENOSCOPY     w/baloon x 2  . ESOPHAGOGASTRODUODENOSCOPY N/A 11/08/2012   Procedure: ESOPHAGOGASTRODUODENOSCOPY (EGD);  Surgeon: Gatha Mayer, MD;  Location: [facility];  Service: Endoscopy;  Laterality: N/A;  . LEFT HEART CATH AND CORONARY ANGIOGRAPHY N/A 06/28/2018   Procedure: LEFT HEART CATH AND CORONARY ANGIOGRAPHY;  Surgeon: Martinique, Peter M, MD;  Location: [place] [facility];  Service: Cardiovascular:  90% prox RCA -> DES PCI. Ost-mLM 30%. Ost-prox Cx 40%.   EF 55-65%. LVEDP 15 mmHg.   [name] RIGHT HEART CATH N/A 07/11/2018   Procedure: RIGHT HEART CATH;  Surgeon: Leonie Man, MD;  Location: [place] [facility];  Service: Cardiovascular;  Laterality: N/A;  . TEE WITHOUT CARDIOVERSION N/A 07/11/2018   Procedure: TRANSESOPHAGEAL ECHOCARDIOGRAM (TEE);  Surgeon: [name], MD;  Location: [place];  Service: Cardiology;  Laterality: N/A;  . TOTAL HIP ARTHROPLASTY     Left  . TRANSTHORACIC ECHOCARDIOGRAM  04/2017   A) EF 60-65%. GR 1 DD. Mild AS with SEVERE AI;; B) Calcified aortic valve with mild stenosis and moderate to severe AI (visually appears severe, but holodiastolic flow reversal in the descending thoracic aorta not seen).  Mild to moderate MR.  EF 60 to 65% with GR 1 DD and no R WMA.  [name] TRANSTHORACIC ECHOCARDIOGRAM  05/2018   EF 60-65%.  No R WMA.  GR 1 DD.  Mild aortic stenosis with severe regurgitation.  Mild MR..  Only mild LV dilation noted.   Social History   Social History Narrative   Married   2 children   No regular exercise         family history  includes Coronary artery disease in her mother and another  family member; Diabetes in her mother and son; Hypertension in her father; Kidney cancer in her sister; Kidney disease in an other family member.   Review of Systems As per HPI, otherwise complete review of systems appears negative  Objective:   Physical Exam @BP  (!) 96/50 (BP Location: Left Arm, Patient Position: Sitting, Cuff Size: Normal)   Pulse 71   Temp 98.4 F (36.9 C)   Ht 5\' 4"  (1.626 m)   Wt 153 lb 2 oz (69.5 kg)   BMI 26.28 kg/m @  General:  Frail and chronically ill Eyes:  anicteric. ENT:   Mouth and posterior pharynx free of lesions. + dentures Neck:   supple w/o thyromegaly or mass.  Lungs: Diffuse insp/exp wheezing and fair to poor air movement Heart:  S1S2, no rubs, murmurs, gallops. Abdomen:  soft, non-tender, no hepatosplenomegaly, hernia, or mass and BS+.  Rectal: Rovanda CMA present No impaction, nontender no mass and no stool  Lymph:  no cervical or supraclavicular adenopathy. Extremities:   no edema, cyanosis or clubbing Skin   no rash. Neuro:  A&O x 3.  Psych:  Flat affect   Data Reviewed: See HPI

## 2018-09-16 ENCOUNTER — Other Ambulatory Visit: Payer: Self-pay

## 2018-09-16 ENCOUNTER — Ambulatory Visit (INDEPENDENT_AMBULATORY_CARE_PROVIDER_SITE_OTHER): Payer: Medicare HMO | Admitting: Internal Medicine

## 2018-09-16 ENCOUNTER — Encounter: Payer: Self-pay | Admitting: Internal Medicine

## 2018-09-16 VITALS — BP 106/40 | HR 78 | Temp 98.0°F | Ht 64.0 in | Wt 152.0 lb

## 2018-09-16 DIAGNOSIS — J440 Chronic obstructive pulmonary disease with acute lower respiratory infection: Secondary | ICD-10-CM

## 2018-09-16 DIAGNOSIS — R69 Illness, unspecified: Secondary | ICD-10-CM | POA: Diagnosis not present

## 2018-09-16 DIAGNOSIS — R400 Somnolence: Secondary | ICD-10-CM | POA: Diagnosis not present

## 2018-09-16 DIAGNOSIS — I10 Essential (primary) hypertension: Secondary | ICD-10-CM | POA: Diagnosis not present

## 2018-09-16 DIAGNOSIS — E034 Atrophy of thyroid (acquired): Secondary | ICD-10-CM

## 2018-09-16 MED ORDER — HYDROCODONE-ACETAMINOPHEN 10-325 MG PO TABS: 1.0000 | ORAL_TABLET | Freq: Four times a day (QID) | ORAL | 0 refills | Status: DC | PRN

## 2018-09-16 MED ORDER — ALBUTEROL SULFATE (2.5 MG/3ML) 0.083% IN NEBU: 2.5000 mg | INHALATION_SOLUTION | RESPIRATORY_TRACT | 5 refills | Status: DC | PRN

## 2018-09-16 MED ORDER — ALBUTEROL SULFATE HFA 108 (90 BASE) MCG/ACT IN AERS: 2.0000 | INHALATION_SPRAY | Freq: Four times a day (QID) | RESPIRATORY_TRACT | 5 refills | Status: AC | PRN

## 2018-09-16 NOTE — Assessment & Plan Note (Signed)
NAS 

## 2018-09-16 NOTE — Progress Notes (Signed)
Subjective:  Patient ID: Kaylee Hansen, female    DOB: [birth year]  Age: 63 y.o. MRN: [id number]  CC: No chief complaint on file.   HPI [facility] presents for COPD, anxiety, LBP f/u  Outpatient Medications Prior to Visit  Medication Sig Dispense Refill  . acetaminophen (TYLENOL) 325 MG tablet Take 2 tablets (650 mg total) by mouth every 4 (four) hours as needed for headache or mild pain.    [name] albuterol (PROVENTIL HFA;VENTOLIN HFA) 108 (90 Base) MCG/ACT inhaler Inhale 2 puffs into the lungs every 6 (six) hours as needed for wheezing. 1 Inhaler 5  . ALPRAZolam (XANAX) 0.25 MG tablet Take 1 tablet (0.25 mg total) by mouth at bedtime as needed for anxiety. (Patient taking differently: Take 0.25 mg by mouth at bedtime. ) 90 tablet 1  . aspirin EC 81 MG tablet Take 1 tablet (81 mg total) by mouth daily. 90 tablet 3  . bisacodyl (DULCOLAX) 5 MG EC tablet Take 5 mg by mouth every other day.    . budesonide-formoterol (SYMBICORT) 160-4.5 MCG/ACT inhaler Inhale 2 puffs into the lungs 2 (two) times daily at 10 AM and 5 PM. Rinse mouth after each use 1 Inhaler 0  . clopidogrel (PLAVIX) 75 MG tablet Take 1 tablet (75 mg total) by mouth daily with breakfast. 90 tablet 3  . diltiazem (CARDIZEM CD) 120 MG 24 hr capsule Take 1 capsule (120 mg total) by mouth at bedtime. 90 capsule 3  . furosemide (LASIX) 20 MG tablet TAKE 1 TABLET BY MOUTH ONCE DAILY 90 tablet 0  . guaiFENesin (MUCINEX) 600 MG 12 hr tablet Take 1 tablet (600 mg total) by mouth 2 (two) times daily as needed for cough or to loosen phlegm. 14 tablet 0  . HYDROcodone-acetaminophen (NORCO) 10-325 MG tablet Take 1 tablet by mouth every 6 (six) hours as needed for severe pain. Please fill on or after 07/20/18 (Patient taking differently: Take 1 tablet by mouth 2 (two) times daily. Please fill on or after 07/20/18) 120 tablet 0  . isosorbide mononitrate (IMDUR) 30 MG 24 hr tablet TAKE 1 TABLET BY MOUTH ONCE DAILY 90 tablet 2  . levothyroxine  (SYNTHROID, LEVOTHROID) 150 MCG tablet Take 1 tablet (150 mcg total) by mouth daily. 90 tablet 3  . nitroGLYCERIN (NITROSTAT) 0.4 MG SL tablet DISSOLVE ONE TABLET UNDER THE TONGUE EVERY 5 MINUTES AS NEEDED FOR CHEST PAIN.  DO NOT EXCEED A TOTAL OF 3 DOSES IN 15 MINUTES 25 tablet 0  . OXYGEN Inhale 2.5 L/min into the lungs at bedtime.    . pantoprazole (PROTONIX) 40 MG tablet Take 1 tablet (40 mg total) by mouth daily before breakfast. 90 tablet 3  . PARoxetine (PAXIL) 40 MG tablet Take 1 tablet (40 mg total) by mouth daily. 90 tablet 3  . phenytoin (DILANTIN) 100 MG ER capsule TAKE 100 MG EVERY MORNING AND 200 MG EVERY NIGHT. (Patient taking differently: Take 100-200 mg by mouth See admin instructions. Take 1 capsule (100 mg) by mouth in the morning & take 2 capsules (200 mg) by mouth at night.) 270 capsule 3  . polyethylene glycol (MIRALAX / GLYCOLAX) packet Take 17 g by mouth daily.    . promethazine (PHENERGAN) 25 MG tablet Take 1 tablet (25 mg total) by mouth every 8 (eight) hours as needed for nausea or vomiting. 90 tablet 1  . Respiratory Therapy Supplies (FLUTTER) DEVI As directed 1 each 0  . rosuvastatin (CRESTOR) 40 MG tablet Take 1 tablet (40  mg total) by mouth daily. Discontinue 20 mg dose (Patient taking differently: Take 40 mg by mouth daily. ) 90 tablet 3  . SUMAtriptan (IMITREX) 100 MG tablet TAKE 1 TABLET BY MOUTH EVERY 2 HOURS AS NEEDED FOR MIAGRAINE OR HEADACHE MAY REPEAT IN 2 HOURS (Patient taking differently: Take 100 mg by mouth every 2 (two) hours as needed for migraine. May repeat dose in 2 hours if no relief.  Do not exceed 2 doses in 24 hours.) 9 tablet 1  . traZODone (DESYREL) 100 MG tablet TAKE 1 TABLET BY MOUTH EVERYDAY AT BEDTIME (Patient taking differently: Take 100 mg by mouth at bedtime. ) 90 tablet 3  . Vitamin D, Ergocalciferol, (DRISDOL) 50000 units CAPS capsule TAKE ONE CAPSULE BY MOUTH ONCE A WEEK (Patient taking differently: Take 50,000 Units by mouth every Monday. )  12 capsule 2   No facility-administered medications prior to visit.     ROS: Review of Systems  Constitutional: Positive for fatigue. Negative for activity change, appetite change, chills and unexpected weight change.  HENT: Negative for congestion, mouth sores and sinus pressure.   Eyes: Negative for visual disturbance.  Respiratory: Positive for cough, shortness of breath and wheezing. Negative for chest tightness.   Gastrointestinal: Negative for abdominal pain and nausea.  Genitourinary: Negative for difficulty urinating, frequency and vaginal pain.  Musculoskeletal: Positive for arthralgias and back pain. Negative for gait problem.  Skin: Negative for pallor and rash.  Neurological: Positive for weakness. Negative for dizziness, tremors, numbness and headaches.  Psychiatric/Behavioral: Positive for decreased concentration, dysphoric mood and sleep disturbance. Negative for behavioral problems, confusion and suicidal ideas. The patient is nervous/anxious.     Objective:  BP (!) 106/40 (BP Location: Left Arm, Patient Position: Sitting, Cuff Size: Normal)   Pulse 78   Temp 98 F (36.7 C) (Oral)   Ht 5\' 4"  (1.626 m)   Wt 152 lb (68.9 kg)   SpO2 (!) 88%   BMI 26.09 kg/m   BP Readings from Last 3 Encounters:  09/16/18 (!) 106/40  09/12/18 (!) 96/50  07/19/18 (!) 108/45    Wt Readings from Last 3 Encounters:  09/16/18 152 lb (68.9 kg)  09/12/18 153 lb 2 oz (69.5 kg)  07/19/18 150 lb (68 kg)    Physical Exam Constitutional:      General: She is not in acute distress.    Appearance: She is well-developed.  HENT:     Head: Normocephalic.     Right Ear: External ear normal.     Left Ear: External ear normal.     Nose: Nose normal.  Eyes:     General:        Right eye: No discharge.        Left eye: No discharge.     Conjunctiva/sclera: Conjunctivae normal.     Pupils: Pupils are equal, round, and reactive to light.  Neck:     Musculoskeletal: Normal range of motion  and neck supple.     Thyroid: No thyromegaly.     Vascular: No JVD.     Trachea: No tracheal deviation.  Cardiovascular:     Rate and Rhythm: Normal rate and regular rhythm.     Heart sounds: Normal heart sounds.  Pulmonary:     Effort: No respiratory distress.     Breath sounds: No stridor. Wheezing present.  Abdominal:     General: Bowel sounds are normal. There is no distension.     Palpations: Abdomen is soft. There is no  mass.     Tenderness: There is no abdominal tenderness. There is no guarding or rebound.  Musculoskeletal:        General: Tenderness present.  Lymphadenopathy:     Cervical: No cervical adenopathy.  Skin:    Findings: No erythema or rash.  Neurological:     Mental Status: She is oriented to person, place, and time.     Cranial Nerves: No cranial nerve deficit.     Motor: No abnormal muscle tone.     Coordination: Coordination abnormal.     Deep Tendon Reflexes: Reflexes normal.  Psychiatric:        Behavior: Behavior normal.        Thought Content: Thought content normal.        Judgment: Judgment normal.   in a w/c  Lab Results  Component Value Date   WBC 7.0 09/12/2018   HGB 12.0 09/12/2018   HCT 35.6 (L) 09/12/2018   PLT 282.0 09/12/2018   GLUCOSE 76 07/08/2018   CHOL 128 06/29/2018   TRIG 184 (H) 06/29/2018   HDL 35 (L) 06/29/2018   LDLDIRECT 187.0 07/14/2015   LDLCALC 56 06/29/2018   ALT 21 03/19/2018   AST 18 03/19/2018   NA 144 07/08/2018   K 4.5 07/08/2018   CL 102 07/08/2018   CREATININE 0.95 07/08/2018   BUN 12 07/08/2018   CO2 26 07/08/2018   TSH 0.46 07/14/2015   INR 1.0 09/16/2008   HGBA1C  09/17/2008    5.0 (NOTE)   The ADA recommends the following therapeutic goal for glycemic   control related to Hgb A1C measurement:   Goal of Therapy:   < 7.0% Hgb A1C   Reference: American Diabetes Association: Clinical Practice   Recommendations 2008, Diabetes Care,  2008, 31:(Suppl 1).    No results found.  Assessment & Plan:    There are no diagnoses linked to this encounter.   No orders of the defined types were placed in this encounter.    Follow-up: No follow-ups on file.  Walker Kehr, MD

## 2018-09-16 NOTE — Assessment & Plan Note (Signed)
Norco, (Fentanyl - d/c 7/17)  Potential benefits of a long term opioids use as well as potential risks (i.e. addiction risk, apnea etc) and complications (i.e. Somnolence, constipation and others) were explained to the patient and were aknowledged. Vit D

## 2018-09-16 NOTE — Assessment & Plan Note (Signed)
On Levothroid 

## 2018-09-16 NOTE — Assessment & Plan Note (Signed)
Albuterol MDI/HHN

## 2018-09-16 NOTE — Assessment & Plan Note (Signed)
In a w/c 

## 2018-09-17 DIAGNOSIS — C3490 Malignant neoplasm of unspecified part of unspecified bronchus or lung: Secondary | ICD-10-CM | POA: Diagnosis not present

## 2018-09-26 ENCOUNTER — Telehealth: Payer: Self-pay

## 2018-09-26 NOTE — Telephone Encounter (Signed)
Wed 4-8 10am  spoke with pt she states that she would rather have a OV we discussed this and she is amenable to having a telephone visit. She has MyChart BP cuff and scale. Informed we will be calling the day before to verify medications, etc... and we will call at about 945am to get vital signs. She is agreeable to this and will be awaiting our calls.

## 2018-09-30 NOTE — Progress Notes (Signed)
Virtual Visit via Telephone Note   This visit type was conducted due to national recommendations for restrictions regarding the COVID-19 Pandemic (e.g. social distancing) in an effort to limit this patient's exposure and mitigate transmission in our community.  Due to her co-morbid illnesses, this patient is at least at moderate risk for complications without adequate follow up.  This format is felt to be most appropriate for this patient at this time.  The patient did not have access to video technology/had technical difficulties with video requiring transitioning to audio format only (telephone).  All issues noted in this document were discussed and addressed.  No physical exam could be performed with this format.  Please refer to the patient's chart for her  consent to telehealth for [facility].   Evaluation Performed:  Follow-up visit  This visit type was conducted due to national recommendations for restrictions regarding the COVID-19 Pandemic (e.g. social distancing).  This format is felt to be most appropriate for this patient at this time.  All issues noted in this document were discussed and addressed.  No physical exam was performed (except for noted visual exam findings with Telehealth visits).  See MyChart message from today for the patient's consent to telehealth for [facility].  Patient has given permission to conduct this visit via virtual appointment and to bill insurance.   Date:  10/02/2018   ID:  Kaylee Hansen, DOB 08-Sep-1955, MRN [id number]  Patient Location:  [address] [place] [address]   Provider location:   [place], [place] office   PCP:  Cassandria Anger, MD  Cardiologist:  [name], MD  Electrophysiologist:  None   Chief Complaint:  Follow up visit   History of Present Illness:    Kaylee Hansen is a 63 y.o. female who presents via audio/video conferencing for a telehealth visit today for ongoing assessment and management of severe  AoV regurgitation. She underwent a cardiac cath by Dr. 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   A follow up TEE was completed by Dr. Margaretann Loveless on02-03-20 which revealed severe regurgitation suggested by holodiastolic flow reversal in the descending aorta and a vena contracta = 6 mm. She then had a subsequent RHC per Dr. Ellyn Hack on 29-Jul-2018. This revealed normal right heart cath pressures. Severely reduced cardiac output and index by Fick 3.63 and 2.07. He was referred to Dr. Roxy Manns for AoV replacement   After seeing Dr. Roxy Manns, she and her husband decided to wait and continue medical therapy. She was to have follow up pulmonary function testing and repeat echo every 6 months.  She did not have PFT's done. She reported that she needs to have new pulmonologist as she was unhappy with the one she was seeing and did not want to go back to see him.  She continues to have dyspnea which is at baseline. Very sedentary and therefore not able to let me know how she does with exertion. She is mostly sitting in a chair or in bed. She has no trouble getting her medications or affording food.   The patient does not have symptoms concerning for COVID-19 infection (fever, chills, cough, or new SHORTNESS OF BREATH).    Prior CV studies:   The following studies were reviewed today: TEE 2018-07-29 Study Conclusions  - Left ventricle: Systolic  function was normal. The estimated   ejection fraction was in the range of 60% to 65%. - Aortic valve: There was severe regurgitation. Severe   regurgitation is suggested by holodiastolic flow reversal in the   descending aorta and a vena contracta = 6 mm. - Aorta: Mild aortic atherosclerosis in the descending thoracic   aorta and aortic arch. - Mitral valve:  There was trivial regurgitation. - Left atrium: No evidence of thrombus in the atrial cavity or   appendage. - Right ventricle: The cavity size was normal. Wall thickness was   normal. Systolic function was normal. - Right atrium: No evidence of thrombus in the atrial cavity or   appendage. - Atrial septum: There was a possible, very small patent foramen   ovale. - Tricuspid valve: There was trivial regurgitation.  Impressions:  - Severe aortic valve regurgitation, mechanism is likely due to   cusp thickening and restriction (Type III). Vena contracta is 7   mm, holodiastolic flow reversals seen in descending aorta. Normal   LV size and function.  Cardiac Cath 06/28/2018   Ost LM to Mid LM lesion is 30% stenosed.  Ost Cx to Prox Cx lesion is 40% stenosed.  Prox RCA lesion is 90% stenosed.  Post intervention, there is a 0% residual stenosis.  A drug-eluting stent was successfully placed using a STENT SYNERGY DES 3.5X16.  The left ventricular systolic function is normal.  LV end diastolic pressure is normal.  The left ventricular ejection fraction is 55-65% by visual estimate.   1. 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 Normal LV function 3. Normal LVEDP 15 mm Hg 4. Successful PCI of the proximal RCA with DES.   [name] 07/11/2018  Normal Right Heart Cath Numbers  Severely reduced cardiac output and index by Fick   SUMMARY  Relatively normal Right Heart Cath pressures: PA pressure 26/14 mmHg-mean 20 mmHg; PCWP 13 mmHg.  RAP 6 mmHg, RVP 28/3 mmHg-EDP 10 mmHg.  Severely reduced cardiac output and index by Fick: 3.63 and 2.07. Despite normal EF on echocardiogram, cardiac output is low, likely related to valvular disease.   RECOMMENDATION  Discharge home after bedrest  Continue with referral to Dr. Roxy Manns.  Continue to titrate medications for "diastolic heart failure/valvular cardiomyopathy "  Past Medical History:  Diagnosis Date  .  Anxiety   . Aortic insufficiency with aortic stenosis 04/2017   Echo with severe aortic insufficiency with mild aortic stenosis.  [name] CAD S/P percutaneous coronary angioplasty 11/2017   Proximal RCA PCI Synergy DES 3.5 mm x 16 mm (3.8 mm). Ost-mLM 30%. Ost-prox Cx 40%.  . Collagenous colitis   . Colon adenomas 2011  . Constipation    Chronic abdominal pain and constipation  . COPD (chronic obstructive pulmonary disease) ([place])   . Depression   . Diverticulosis   . Esophageal stricture 11/08/2012  . Esophageal ulcer    04/2009 EGD  . GERD (gastroesophageal reflux disease)   . Helicobacter pylori gastritis 2010   Pylera Tx  . History of cholecystectomy   . Hx of appendectomy   . Hx of cancer of lung 1999  . Hx of hysterectomy   . Hyperlipidemia   . Hypothyroidism   . Low back pain   . Non-STEMI (non-ST elevated myocardial infarction) ([place]) 11/2017   RCA PCI  . Osteoarthritis of knee    bilateral knee  . Seizures ([place])   . Stroke [facility])    CVA, hx of 97  . 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  [facility])    Past Surgical History:  Procedure Laterality Date  . ABDOMINAL HYSTERECTOMY     complete 1992  . ABDOMINAL SURGERY     Exploratory  . APPENDECTOMY    . BALLOON DILATION N/A 11/08/2012   Procedure: BALLOON DILATION;  Surgeon: Gatha Mayer, MD;  Location: [facility];  Service: Endoscopy;  Laterality: N/A;  . CHOLECYSTECTOMY    . COLONOSCOPY W/ BIOPSIES     multiple  . CORONARY STENT INTERVENTION N/A 06/28/2018   Procedure: CORONARY STENT INTERVENTION;  Surgeon: Martinique, Peter M, MD;  Location: [place] CV LAB;  Service: Cardiovascular;  Laterality: N/A;  90% prox RCA -> PCI with Synergy DES 3.5 mm x 16 mm (3.8 mm).  . CT CTA CORONARY W/CA SCORE W/CM &/OR WO/CM  05/2017   Coronary calcium score 2.9. Intermediate risk. Unusual noncalcified plaque in RCA --FFR negative  . ESOPHAGOGASTRODUODENOSCOPY     w/baloon x 2  . ESOPHAGOGASTRODUODENOSCOPY N/A 11/08/2012   Procedure:  ESOPHAGOGASTRODUODENOSCOPY (EGD);  Surgeon: Gatha Mayer, MD;  Location: [facility];  Service: Endoscopy;  Laterality: N/A;  . LEFT HEART CATH AND CORONARY ANGIOGRAPHY N/A 06/28/2018   Procedure: LEFT HEART CATH AND CORONARY ANGIOGRAPHY;  Surgeon: Martinique, Peter M, MD;  Location: [place] CV LAB;  Service: Cardiovascular:  90% prox RCA -> DES PCI. Ost-mLM 30%. Ost-prox Cx 40%.   EF 55-65%. LVEDP 15 mmHg.   [name] RIGHT HEART CATH N/A 07/11/2018   Procedure: RIGHT HEART CATH;  Surgeon: Leonie Man, MD;  Location: [place] CV LAB;  Service: Cardiovascular;  Laterality: N/A;  . TEE WITHOUT CARDIOVERSION N/A 07/11/2018   Procedure: TRANSESOPHAGEAL ECHOCARDIOGRAM (TEE);  Surgeon: [name], MD;  Location: [place];  Service: Cardiology;  Laterality: N/A;  . TOTAL HIP ARTHROPLASTY     Left  . TRANSTHORACIC ECHOCARDIOGRAM  04/2017   A) EF 60-65%. GR 1 DD. Mild AS with SEVERE AI;; B) Calcified aortic valve with mild stenosis and moderate to severe AI (visually appears severe, but holodiastolic flow reversal in the descending thoracic aorta not seen).  Mild to moderate MR.  EF 60 to 65% with GR 1 DD and no R WMA.  [name] TRANSTHORACIC ECHOCARDIOGRAM  05/2018   EF 60-65%.  No R WMA.  GR 1 DD.  Mild aortic stenosis with severe regurgitation.  Mild MR..  Only mild LV dilation noted.     Current Meds  Medication Sig  . acetaminophen (TYLENOL) 325 MG tablet Take 2 tablets (650 mg total) by mouth every 4 (four) hours as needed for headache or mild pain.  [name] albuterol (PROVENTIL HFA;VENTOLIN HFA) 108 (90 Base) MCG/ACT inhaler Inhale 2 puffs into the lungs every 6 (six) hours as needed for wheezing or shortness of breath.  [name] albuterol (PROVENTIL) (2.5 MG/3ML) 0.083% nebulizer solution Take 3 mLs (2.5 mg total) by nebulization every 4 (four) hours as needed for wheezing or shortness of breath.  . bisacodyl (DULCOLAX) 5 MG EC tablet Take 5 mg by mouth every other day.  . budesonide-formoterol  (SYMBICORT) 160-4.5 MCG/ACT inhaler Inhale 2 puffs into the lungs 2 (two) times daily at 10 AM and 5 PM. Rinse mouth after each use  . clopidogrel (PLAVIX) 75 MG tablet Take 1 tablet (75 mg total) by mouth daily with breakfast.  . furosemide (LASIX) 20 MG tablet TAKE 1 TABLET BY MOUTH ONCE DAILY  . guaiFENesin (MUCINEX) 600 MG 12 hr tablet Take 1 tablet (600 mg total) by mouth 2 (two) times daily  as needed for cough or to loosen phlegm.  [name] HYDROcodone-acetaminophen (NORCO) 10-325 MG tablet Take 1 tablet by mouth every 6 (six) hours as needed for severe pain. Please fill on or after 09/18/18  . isosorbide mononitrate (IMDUR) 30 MG 24 hr tablet TAKE 1 TABLET BY MOUTH ONCE DAILY  . levothyroxine (SYNTHROID, LEVOTHROID) 150 MCG tablet Take 1 tablet (150 mcg total) by mouth daily.  . nitroGLYCERIN (NITROSTAT) 0.4 MG SL tablet DISSOLVE ONE TABLET UNDER THE TONGUE EVERY 5 MINUTES AS NEEDED FOR CHEST PAIN.  DO NOT EXCEED A TOTAL OF 3 DOSES IN 15 MINUTES  . OXYGEN Inhale 2.5 L/min into the lungs at bedtime.  . pantoprazole (PROTONIX) 40 MG tablet Take 1 tablet (40 mg total) by mouth daily before breakfast.  . PARoxetine (PAXIL) 40 MG tablet Take 1 tablet (40 mg total) by mouth daily.  . phenytoin (DILANTIN) 100 MG ER capsule TAKE 100 MG EVERY MORNING AND 200 MG EVERY NIGHT. (Patient taking differently: Take 100-200 mg by mouth See admin instructions. Take 1 capsule (100 mg) by mouth in the morning & take 2 capsules (200 mg) by mouth at night.)  . polyethylene glycol (MIRALAX / GLYCOLAX) packet Take 17 g by mouth daily.  . promethazine (PHENERGAN) 25 MG tablet Take 1 tablet (25 mg total) by mouth every 8 (eight) hours as needed for nausea or vomiting.  [name] Respiratory Therapy Supplies (FLUTTER) DEVI As directed  . rosuvastatin (CRESTOR) 40 MG tablet Take 1 tablet (40 mg total) by mouth daily. Discontinue 20 mg dose (Patient taking differently: Take 40 mg by mouth daily. )  . SUMAtriptan (IMITREX) 100 MG tablet  TAKE 1 TABLET BY MOUTH EVERY 2 HOURS AS NEEDED FOR MIAGRAINE OR HEADACHE MAY REPEAT IN 2 HOURS (Patient taking differently: Take 100 mg by mouth every 2 (two) hours as needed for migraine. May repeat dose in 2 hours if no relief.  Do not exceed 2 doses in 24 hours.)  . traZODone (DESYREL) 100 MG tablet TAKE 1 TABLET BY MOUTH EVERYDAY AT BEDTIME (Patient taking differently: Take 100 mg by mouth at bedtime. )  . Vitamin D, Ergocalciferol, (DRISDOL) 50000 units CAPS capsule TAKE ONE CAPSULE BY MOUTH ONCE A WEEK (Patient taking differently: Take 50,000 Units by mouth every Monday. )     Allergies:   Morphine; Nitrofuran derivatives; Oxycodone hcl; Penicillins; and Tape   Social History   Tobacco Use  . Smoking status: Current Every Day Smoker    Packs/day: 1.00    Years: 48.00    Pack years: 48.00    Types: Cigarettes  . Smokeless tobacco: Never Used  Substance Use Topics  . Alcohol use: No  . Drug use: No     Family Hx: The patient's family history includes Coronary artery disease in her mother and another family member; Diabetes in her mother and son; Hypertension in her father; Kidney cancer in her sister; Kidney disease in an other family member. There is no history of Colon cancer.  ROS:   Please see the history of present illness.    All other systems reviewed and are negative.   Labs/Other Tests and Data Reviewed:    Recent Labs: 03/19/2018: ALT 21 07/08/2018: BUN 12; Creatinine, Ser 0.95; Potassium 4.5; Sodium 144 09/12/2018: Hemoglobin 12.0; Platelets 282.0   Recent Lipid Panel Lab Results  Component Value Date/Time   CHOL 128 06/29/2018 12:07 AM   CHOL 228 (H) 03/19/2018 09:56 AM   TRIG 184 (H) 06/29/2018 12:07 AM  HDL 35 (L) 06/29/2018 12:07 AM   HDL 48 03/19/2018 09:56 AM   CHOLHDL 3.7 06/29/2018 12:07 AM   LDLCALC 56 06/29/2018 12:07 AM   LDLCALC 142 (H) 03/19/2018 09:56 AM   LDLDIRECT 187.0 07/14/2015 10:59 AM    Wt Readings from Last 3 Encounters:  10/02/18  150 lb (68 kg)  09/16/18 152 lb (68.9 kg)  09/12/18 153 lb 2 oz (69.5 kg)     Exam:    Vital Signs:  BP (!) 139/43 (BP Location: Right Arm)   Pulse 79   Ht 5\' 4"  (1.626 m)   Wt 150 lb (68 kg)   BMI 25.75 kg/m     Limited assessment as she is a telephone visit. She did not sound short of breath when speaking to me, no wheezing was heard. She was able to understand and answer questions. She verbalized appropriately.   ASSESSMENT & PLAN:    1. Severe AoV Regurgitation-Stage 1: She continues to be at baseline. She is dyspneic but no worse than usual. She is very sedentary. She denies chest pain. She is compliant with her medications. Will continue to treat medically with lasix and nitrates. She will need follow up echo in June or July when we are doing elective testing again.  2. Hypercholesterolemia: Continue to take statin. Will have follow up labs on next appointment unless PCP has completed this.   3. COPD: Continues on inhalers. She will follow up with PCP but wants to be referred to new pulmonologist. May do this on next appointment unless her PCP has made the referral.    COVID-19 Education: The signs and symptoms of COVID-19 were discussed with the patient and how to seek care for testing (follow up with PCP or arrange E-visit). The importance of social distancing was discussed today.  Patient Risk:   After full review of this patients clinical status, I feel that they are at least moderate risk at this time.  Time:   Today, I have spent 10  minutes with the patient with telehealth technology discussing above diagnosis, and symptoms. .     Medication Adjustments/Labs and Tests Ordered: Current medicines are reviewed at length with the patient today.  Concerns regarding medicines are outlined above.   Tests Ordered: No orders of the defined types were placed in this encounter.  Medication Changes: No orders of the defined types were placed in this encounter.    Disposition:  See in 3 months. Needs to have echo scheduled   Signed, Phill Myron. West Pugh, ANP, [facility]  10/02/2018 9:54 AM    [facility] [address], [place], [place]  [address] Phone: [phone]; Fax: [phone]

## 2018-10-02 ENCOUNTER — Telehealth (INDEPENDENT_AMBULATORY_CARE_PROVIDER_SITE_OTHER): Payer: Medicare HMO | Admitting: Adult Health

## 2018-10-02 ENCOUNTER — Ambulatory Visit: Payer: Medicare HMO | Admitting: Adult Health

## 2018-10-02 VITALS — BP 139/43 | HR 79 | Ht 64.0 in | Wt 150.0 lb

## 2018-10-02 DIAGNOSIS — J449 Chronic obstructive pulmonary disease, unspecified: Secondary | ICD-10-CM | POA: Diagnosis not present

## 2018-10-02 DIAGNOSIS — E7800 Pure hypercholesterolemia, unspecified: Secondary | ICD-10-CM | POA: Diagnosis not present

## 2018-10-02 DIAGNOSIS — I214 Non-ST elevation (NSTEMI) myocardial infarction: Secondary | ICD-10-CM | POA: Diagnosis not present

## 2018-10-02 DIAGNOSIS — I351 Nonrheumatic aortic (valve) insufficiency: Secondary | ICD-10-CM

## 2018-10-02 DIAGNOSIS — Z79899 Other long term (current) drug therapy: Secondary | ICD-10-CM | POA: Diagnosis not present

## 2018-10-02 NOTE — Patient Instructions (Signed)
Medication Instructions:  NO CHANGES- Your physician recommends that you continue on your current medications as directed. Please refer to the Current Medication list given to you today. If you need a refill on your cardiac medications before your next appointment, please call your pharmacy.  Testing/Procedures: Echocardiogram - Your physician has requested that you have an echocardiogram. Echocardiography is a painless test that uses sound waves to create images of your heart. It provides your doctor with information about the size and shape of your heart and how well your heart's chambers and valves are working. This procedure takes approximately one hour. There are no restrictions for this procedure. This will be performed at our [facility] location - [address], [address].  Follow-Up: You will need a follow up appointment in 3 months.  Please call our office 2 months in advance to schedule this appointment.  You may see [name], MD or one of the following Advanced Practice Providers on your designated Care Team:  Rosaria Ferries, PA-C  Jory Sims, DNP, ANP   At [facility], you and your health needs are our priority.  As part of our continuing mission to provide you with exceptional heart care, we have created designated Provider Care Teams.  These Care Teams include your primary Cardiologist (physician) and Advanced Practice Providers (APPs -  Physician Assistants and Nurse Practitioners) who all work together to provide you with the care you need, when you need it.  Thank you for choosing [facility]!!

## 2018-10-18 DIAGNOSIS — C3490 Malignant neoplasm of unspecified part of unspecified bronchus or lung: Secondary | ICD-10-CM | POA: Diagnosis not present

## 2018-10-23 ENCOUNTER — Encounter: Payer: Self-pay | Admitting: General Surgery

## 2018-10-24 ENCOUNTER — Encounter: Payer: Self-pay | Admitting: Internal Medicine

## 2018-10-24 ENCOUNTER — Ambulatory Visit (INDEPENDENT_AMBULATORY_CARE_PROVIDER_SITE_OTHER): Payer: Medicare HMO | Admitting: Internal Medicine

## 2018-10-24 ENCOUNTER — Other Ambulatory Visit: Payer: Self-pay

## 2018-10-24 ENCOUNTER — Ambulatory Visit: Payer: Medicare HMO | Admitting: Internal Medicine

## 2018-10-24 VITALS — Ht 65.0 in | Wt 150.0 lb

## 2018-10-24 DIAGNOSIS — K5909 Other constipation: Secondary | ICD-10-CM | POA: Diagnosis not present

## 2018-10-24 DIAGNOSIS — R1084 Generalized abdominal pain: Secondary | ICD-10-CM

## 2018-10-24 MED ORDER — POLYETHYLENE GLYCOL 3350 17 G PO PACK: 34.0000 g | PACK | Freq: Every day | ORAL | Status: DC

## 2018-10-24 MED ORDER — SENNOSIDES-DOCUSATE SODIUM 8.6-50 MG PO TABS: 2.0000 | ORAL_TABLET | Freq: Every day | ORAL | Status: DC

## 2018-10-24 MED ORDER — BISACODYL 5 MG PO TBEC: 10.0000 mg | DELAYED_RELEASE_TABLET | Freq: Every day | ORAL | Status: DC

## 2018-10-24 NOTE — Progress Notes (Signed)
TELEHEALTH ENCOUNTER IN SETTING OF COVID-19 PANDEMIC - REQUESTED BY PATIENT SERVICE PROVIDED BY TELEMEDECINE - TYPE: Telephone PATIENT LOCATION: Home PATIENT HAS CONSENTED TO TELEHEALTH VISIT PROVIDER LOCATION: OFFICE  PARTICIPANTS OTHER THAN PATIENT: Husband TIME SPENT ON CALL: 10 minutes    Kaylee Hansen 63 y.o. [birth year] [id number]  Assessment & Plan:   Encounter Diagnoses  Name Primary?  . Chronic constipation Yes  . Generalized abdominal pain      Going to increase MiraLAX to twice a day regularly and add 2 Senokot to 2 Dulcolax nightly.  She will follow-up in a month.  Subjective:   Chief Complaint: Follow-up of chronic constipation  HPI The patient has chronic constipation among other things in the setting of multiple serious comorbidities that involve the cardiopulmonary system, see my 09/12/2018 note.  At that point she was recommended to use MiraLAX and Dulcolax to try to treat her constipation.  She is using this possibly on a regular basis.  But she is only moving her bowels once or twice a week and is still uncomfortable.  She is not really a good candidate for any investigation given to frailty and comorbidities.  She has some bilateral back pressure that improves with defecation. Allergies  Allergen Reactions  . Morphine     "Makes me go crazy."   . Nitrofuran Derivatives     ??confusion  . Oxycodone Hcl Other (See Comments)    Knocked her out for 6 days  . Penicillins Hives    Has patient had a PCN reaction causing immediate rash, facial/tongue/throat swelling, SOB or lightheadedness with hypotension: unknown Has patient had a PCN reaction causing severe rash involving mucus membranes or skin necrosis: unknown Has patient had a PCN reaction that required hospitalization No Has patient had a PCN reaction occurring within the last 10 years: No If all of the above answers are "NO", then may proceed with Cephalosporin use.   . Tape Other (See Comments)     Skin turns red and burns   Current Meds  Medication Sig  . acetaminophen (TYLENOL) 325 MG tablet Take 2 tablets (650 mg total) by mouth every 4 (four) hours as needed for headache or mild pain.  [name] albuterol (PROVENTIL HFA;VENTOLIN HFA) 108 (90 Base) MCG/ACT inhaler Inhale 2 puffs into the lungs every 6 (six) hours as needed for wheezing or shortness of breath.  [name] albuterol (PROVENTIL) (2.5 MG/3ML) 0.083% nebulizer solution Take 3 mLs (2.5 mg total) by nebulization every 4 (four) hours as needed for wheezing or shortness of breath.  . ALPRAZolam (XANAX) 0.25 MG tablet Take 1 tablet (0.25 mg total) by mouth at bedtime as needed for anxiety.  [name] aspirin EC 81 MG tablet Take 1 tablet (81 mg total) by mouth daily.  . bisacodyl (DULCOLAX) 5 MG EC tablet Take 5 mg by mouth every other day.  . budesonide-formoterol (SYMBICORT) 160-4.5 MCG/ACT inhaler Inhale 2 puffs into the lungs 2 (two) times daily at 10 AM and 5 PM. Rinse mouth after each use  . clopidogrel (PLAVIX) 75 MG tablet Take 1 tablet (75 mg total) by mouth daily with breakfast.  . furosemide (LASIX) 20 MG tablet TAKE 1 TABLET BY MOUTH ONCE DAILY  . guaiFENesin (MUCINEX) 600 MG 12 hr tablet Take 1 tablet (600 mg total) by mouth 2 (two) times daily as needed for cough or to loosen phlegm.  [name] HYDROcodone-acetaminophen (NORCO) 10-325 MG tablet Take 1 tablet by mouth every 6 (six) hours as needed for severe pain.  Please fill on or after 09/18/18  . isosorbide mononitrate (IMDUR) 30 MG 24 hr tablet TAKE 1 TABLET BY MOUTH ONCE DAILY  . levothyroxine (SYNTHROID, LEVOTHROID) 150 MCG tablet Take 1 tablet (150 mcg total) by mouth daily.  . nitroGLYCERIN (NITROSTAT) 0.4 MG SL tablet DISSOLVE ONE TABLET UNDER THE TONGUE EVERY 5 MINUTES AS NEEDED FOR CHEST PAIN.  DO NOT EXCEED A TOTAL OF 3 DOSES IN 15 MINUTES  . OXYGEN Inhale 2.5 L/min into the lungs at bedtime.  . pantoprazole (PROTONIX) 40 MG tablet Take 1 tablet (40 mg total) by mouth daily before  breakfast.  . PARoxetine (PAXIL) 40 MG tablet Take 1 tablet (40 mg total) by mouth daily.  . phenytoin (DILANTIN) 100 MG ER capsule TAKE 100 MG EVERY MORNING AND 200 MG EVERY NIGHT. (Patient taking differently: Take 100-200 mg by mouth See admin instructions. Take 1 capsule (100 mg) by mouth in the morning & take 2 capsules (200 mg) by mouth at night.)  . polyethylene glycol (MIRALAX / GLYCOLAX) packet Take 17 g by mouth daily.  . promethazine (PHENERGAN) 25 MG tablet Take 1 tablet (25 mg total) by mouth every 8 (eight) hours as needed for nausea or vomiting.  [name] Respiratory Therapy Supplies (FLUTTER) DEVI As directed  . rosuvastatin (CRESTOR) 40 MG tablet Take 1 tablet (40 mg total) by mouth daily. Discontinue 20 mg dose (Patient taking differently: Take 40 mg by mouth daily. )  . SUMAtriptan (IMITREX) 100 MG tablet TAKE 1 TABLET BY MOUTH EVERY 2 HOURS AS NEEDED FOR MIAGRAINE OR HEADACHE MAY REPEAT IN 2 HOURS (Patient taking differently: Take 100 mg by mouth every 2 (two) hours as needed for migraine. May repeat dose in 2 hours if no relief.  Do not exceed 2 doses in 24 hours.)  . traZODone (DESYREL) 100 MG tablet TAKE 1 TABLET BY MOUTH EVERYDAY AT BEDTIME (Patient taking differently: Take 100 mg by mouth at bedtime. )  . Vitamin D, Ergocalciferol, (DRISDOL) 50000 units CAPS capsule TAKE ONE CAPSULE BY MOUTH ONCE A WEEK (Patient taking differently: Take 50,000 Units by mouth every Monday. )   Past Medical History:  Diagnosis Date  . Anxiety   . Aortic insufficiency with aortic stenosis 04/2017   Echo with severe aortic insufficiency with mild aortic stenosis.  [name] CAD S/P percutaneous coronary angioplasty 11/2017   Proximal RCA PCI Synergy DES 3.5 mm x 16 mm (3.8 mm). Ost-mLM 30%. Ost-prox Cx 40%.  . Collagenous colitis   . Colon adenomas 2011  . Constipation    Chronic abdominal pain and constipation  . COPD (chronic obstructive pulmonary disease) ([place])   . Depression   . Diverticulosis   .  Esophageal stricture 11/08/2012  . Esophageal ulcer    04/2009 EGD  . GERD (gastroesophageal reflux disease)   . Helicobacter pylori gastritis 2010   Pylera Tx  . History of cholecystectomy   . Hx of appendectomy   . Hx of cancer of lung 1999  . Hx of hysterectomy   . Hyperlipidemia   . Hypothyroidism   . Low back pain   . Non-STEMI (non-ST elevated myocardial infarction) ([place]) 11/2017   RCA PCI  . Osteoarthritis of knee    bilateral knee  . Seizures ([place])   . Stroke [facility])    CVA, hx of 97  . 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 ABDOMINAL HYSTERECTOMY     complete 1992  . ABDOMINAL SURGERY  Exploratory  . APPENDECTOMY    . BALLOON DILATION N/A 11/08/2012   Procedure: BALLOON DILATION;  Surgeon: Gatha Mayer, MD;  Location: [facility];  Service: Endoscopy;  Laterality: N/A;  . CHOLECYSTECTOMY    . COLONOSCOPY W/ BIOPSIES     multiple  . CORONARY STENT INTERVENTION N/A 06/28/2018   Procedure: CORONARY STENT INTERVENTION;  Surgeon: Martinique, Peter M, MD;  Location: [place] [facility];  Service: Cardiovascular;  Laterality: N/A;  90% prox RCA -> PCI with Synergy DES 3.5 mm x 16 mm (3.8 mm).  . CT CTA CORONARY W/CA SCORE W/CM &/OR WO/CM  05/2017   Coronary calcium score 2.9. Intermediate risk. Unusual noncalcified plaque in RCA --FFR negative  . ESOPHAGOGASTRODUODENOSCOPY     w/baloon x 2  . ESOPHAGOGASTRODUODENOSCOPY N/A 11/08/2012   Procedure: ESOPHAGOGASTRODUODENOSCOPY (EGD);  Surgeon: Gatha Mayer, MD;  Location: [facility];  Service: Endoscopy;  Laterality: N/A;  . LEFT HEART CATH AND CORONARY ANGIOGRAPHY N/A 06/28/2018   Procedure: LEFT HEART CATH AND CORONARY ANGIOGRAPHY;  Surgeon: Martinique, Peter M, MD;  Location: [place] [facility];  Service: Cardiovascular:  90% prox RCA -> DES PCI. Ost-mLM 30%. Ost-prox Cx 40%.   EF 55-65%. LVEDP 15 mmHg.   [name] RIGHT HEART CATH N/A 07/11/2018   Procedure: RIGHT HEART CATH;  Surgeon: Leonie Man, MD;  Location: [place] [facility];  Service: Cardiovascular;  Laterality: N/A;  . TEE WITHOUT CARDIOVERSION N/A 07/11/2018   Procedure: TRANSESOPHAGEAL ECHOCARDIOGRAM (TEE);  Surgeon: [name], MD;  Location: [place];  Service: Cardiology;  Laterality: N/A;  . TOTAL HIP ARTHROPLASTY     Left  . TRANSTHORACIC ECHOCARDIOGRAM  04/2017   A) EF 60-65%. GR 1 DD. Mild AS with SEVERE AI;; B) Calcified aortic valve with mild stenosis and moderate to severe AI (visually appears severe, but holodiastolic flow reversal in the descending thoracic aorta not seen).  Mild to moderate MR.  EF 60 to 65% with GR 1 DD and no R WMA.  [name] TRANSTHORACIC ECHOCARDIOGRAM  05/2018   EF 60-65%.  No R WMA.  GR 1 DD.  Mild aortic stenosis with severe regurgitation.  Mild MR..  Only mild LV dilation noted.   Social History   Social History Narrative   Married   2 children   No regular exercise         family history includes Coronary artery disease in her mother and another family member; Diabetes in her mother and son; Hypertension in her father; Kidney cancer in her sister; Kidney disease in an other family member.   Review of Systems Wt Readings from Last 3 Encounters:  10/24/18 150 lb (68 kg)  10/23/18 150 lb (68 kg)  10/02/18 150 lb (68 kg)

## 2018-10-24 NOTE — Patient Instructions (Addendum)
Good to talk to you today.  I am sorry you are still struggling with the constipation.  As we discussed I want you to do the following:  Take 2 doses of MiraLAX every day, that is 2 capfuls or 2 tablespoons in about 12 ounces of water.  Take 2 Dulcolax at bedtime  Take 2 Senokot laxative tablets (not stool softener) at bedtime.  We will make an appointment to talk again in about a month.  If you have problems with going too much or other problems in between please call us back. Appointment made for 11/21/2018 at 11:30AM-ZOOM.  I appreciate the opportunity to care for you. Gatha Mayer, MD, [facility]

## 2018-11-11 ENCOUNTER — Other Ambulatory Visit: Payer: Self-pay | Admitting: Cardiology

## 2018-11-17 DIAGNOSIS — C3490 Malignant neoplasm of unspecified part of unspecified bronchus or lung: Secondary | ICD-10-CM | POA: Diagnosis not present

## 2018-11-21 ENCOUNTER — Encounter: Payer: Self-pay | Admitting: Internal Medicine

## 2018-11-21 ENCOUNTER — Other Ambulatory Visit: Payer: Self-pay

## 2018-11-21 ENCOUNTER — Ambulatory Visit (INDEPENDENT_AMBULATORY_CARE_PROVIDER_SITE_OTHER): Payer: Medicare HMO | Admitting: Internal Medicine

## 2018-11-21 DIAGNOSIS — K5909 Other constipation: Secondary | ICD-10-CM

## 2018-11-21 NOTE — Patient Instructions (Addendum)
Glad things are improved but it sounds like I overshot with treatment.  New plans:  Continue MiraLax 2 doses each day.  Take EITHER 2 Senokot or 2 Dulcolax (bisacodyl) at bedtime NOT both  If you get to the end of 2 days without a good bowel movement then take BOTH 2 Senokot and 2 dulcolax at bedtime until bowels have moved enough then go back to only 2 laxative pills at bedtime.  Follow-up with me if this stops working or you have other concerns.  I appreciate the opportunity to care for you. Gatha Mayer, MD, [facility]

## 2018-11-21 NOTE — Assessment & Plan Note (Addendum)
Improved - will change for her to take either 2 Senokot or 2 Dulcolax at bedtime along with 2 doses MiraLax daily - and if she goes 2 days without defecation then take 2+2 Senokot and Dulcolax until she has effective defecation.  We talked about Movantik as a possibility but decided against due to potential sig cost  F/U PRN

## 2018-11-21 NOTE — Progress Notes (Signed)
TELEHEALTH ENCOUNTER IN SETTING OF COVID-19 PANDEMIC - REQUESTED BY PATIENT SERVICE PROVIDED BY TELEMEDECINE - TYPE: Telephone PATIENT LOCATION: Home PATIENT HAS CONSENTED TO TELEHEALTH VISIT PROVIDER LOCATION: OFFICE REFERRING PROVIDER:N/A PARTICIPANTS OTHER THAN PATIENT:none TIME SPENT ON CALL:7 mins    Kaylee Hansen 63 y.o. [birth year] [id number]  Assessment & Plan:   Chronic Constipation Improved - will change for her to take either 2 Senokot or 2 Dulcolax at bedtime along with 2 doses MiraLax daily - and if she goes 2 days without defecation then take 2+2 Senokot and Dulcolax until she has effective defecation.  F/U PRN    Subjective:   Chief Complaint: Follow-up of constipation  HPI Kaylee Hansen reports that she is moving her bowels better after my recommendation of increasing MiraLAX to 2 doses daily and using Senokot and/or Dulcolax.  Turns out I had a bit of miscommunication and she has been taking 2 Senokot and 2 Dulcolax at bedtime most of the time and that may be causing some diarrhea problems.  In general she is definitely moving her bowels better.  She did have an episode where she felt bloated because she did not move her bowels for several days. Allergies  Allergen Reactions  . Morphine     "Makes me go crazy."   . Nitrofuran Derivatives     ??confusion  . Oxycodone Hcl Other (See Comments)    Knocked her out for 6 days  . Penicillins Hives    Has patient had a PCN reaction causing immediate rash, facial/tongue/throat swelling, SOB or lightheadedness with hypotension: unknown Has patient had a PCN reaction causing severe rash involving mucus membranes or skin necrosis: unknown Has patient had a PCN reaction that required hospitalization No Has patient had a PCN reaction occurring within the last 10 years: No If all of the above answers are "NO", then may proceed with Cephalosporin use.   . Tape Other (See Comments)    Skin turns red and burns   Current Meds   Medication Sig  . acetaminophen (TYLENOL) 325 MG tablet Take 2 tablets (650 mg total) by mouth every 4 (four) hours as needed for headache or mild pain.  [name] albuterol (PROVENTIL HFA;VENTOLIN HFA) 108 (90 Base) MCG/ACT inhaler Inhale 2 puffs into the lungs every 6 (six) hours as needed for wheezing or shortness of breath.  [name] albuterol (PROVENTIL) (2.5 MG/3ML) 0.083% nebulizer solution Take 3 mLs (2.5 mg total) by nebulization every 4 (four) hours as needed for wheezing or shortness of breath.  [name] aspirin EC 81 MG tablet Take 1 tablet (81 mg total) by mouth daily.  . bisacodyl (DULCOLAX) 5 MG EC tablet Take 2 tablets (10 mg total) by mouth at bedtime.  . budesonide-formoterol (SYMBICORT) 160-4.5 MCG/ACT inhaler Inhale 2 puffs into the lungs 2 (two) times daily at 10 AM and 5 PM. Rinse mouth after each use  . clopidogrel (PLAVIX) 75 MG tablet Take 1 tablet (75 mg total) by mouth daily with breakfast.  . furosemide (LASIX) 20 MG tablet Take 1 tablet by mouth once daily  . guaiFENesin (MUCINEX) 600 MG 12 hr tablet Take 1 tablet (600 mg total) by mouth 2 (two) times daily as needed for cough or to loosen phlegm.  [name] HYDROcodone-acetaminophen (NORCO) 10-325 MG tablet Take 1 tablet by mouth every 6 (six) hours as needed for severe pain. Please fill on or after 09/18/18  . isosorbide mononitrate (IMDUR) 30 MG 24 hr tablet TAKE 1 TABLET BY MOUTH ONCE DAILY  .  levothyroxine (SYNTHROID, LEVOTHROID) 150 MCG tablet Take 1 tablet (150 mcg total) by mouth daily.  . nitroGLYCERIN (NITROSTAT) 0.4 MG SL tablet DISSOLVE ONE TABLET UNDER THE TONGUE EVERY 5 MINUTES AS NEEDED FOR CHEST PAIN.  DO NOT EXCEED A TOTAL OF 3 DOSES IN 15 MINUTES  . OXYGEN Inhale 2.5 L/min into the lungs at bedtime.  . pantoprazole (PROTONIX) 40 MG tablet Take 1 tablet (40 mg total) by mouth daily before breakfast.  . PARoxetine (PAXIL) 40 MG tablet Take 1 tablet (40 mg total) by mouth daily.  . phenytoin (DILANTIN) 100 MG ER capsule TAKE 100 MG  EVERY MORNING AND 200 MG EVERY NIGHT. (Patient taking differently: Take 100-200 mg by mouth See admin instructions. Take 1 capsule (100 mg) by mouth in the morning & take 2 capsules (200 mg) by mouth at night.)  . polyethylene glycol (MIRALAX / GLYCOLAX) 17 g packet Take 17 g by mouth daily.  . promethazine (PHENERGAN) 25 MG tablet Take 1 tablet (25 mg total) by mouth every 8 (eight) hours as needed for nausea or vomiting.  [name] Respiratory Therapy Supplies (FLUTTER) DEVI As directed  . rosuvastatin (CRESTOR) 40 MG tablet Take 1 tablet (40 mg total) by mouth daily. Discontinue 20 mg dose (Patient taking differently: Take 40 mg by mouth daily. )  . senna-docusate (SENOKOT S) 8.6-50 MG tablet Take 2 tablets by mouth at bedtime.  . SUMAtriptan (IMITREX) 100 MG tablet TAKE 1 TABLET BY MOUTH EVERY 2 HOURS AS NEEDED FOR MIAGRAINE OR HEADACHE MAY REPEAT IN 2 HOURS (Patient taking differently: Take 100 mg by mouth every 2 (two) hours as needed for migraine. May repeat dose in 2 hours if no relief.  Do not exceed 2 doses in 24 hours.)  . traZODone (DESYREL) 100 MG tablet TAKE 1 TABLET BY MOUTH EVERYDAY AT BEDTIME (Patient taking differently: Take 100 mg by mouth at bedtime. )  . Vitamin D, Ergocalciferol, (DRISDOL) 50000 units CAPS capsule TAKE ONE CAPSULE BY MOUTH ONCE A WEEK (Patient taking differently: Take 50,000 Units by mouth every Monday. )   Past Medical History:  Diagnosis Date  . Acute respiratory failure following trauma and surgery ([place]) 08/26/2012  . Anxiety   . Aortic insufficiency with aortic stenosis 04/2017   Echo with severe aortic insufficiency with mild aortic stenosis.  [name] CAD S/P percutaneous coronary angioplasty 11/2017   Proximal RCA PCI Synergy DES 3.5 mm x 16 mm (3.8 mm). Ost-mLM 30%. Ost-prox Cx 40%.  . Collagenous colitis   . Colon adenomas 2011  . Constipation    Chronic abdominal pain and constipation  . COPD (chronic obstructive pulmonary disease) ([place])   . Depression   .  Diverticulosis   . Esophageal stricture 11/08/2012  . Esophageal ulcer    04/2009 EGD  . GERD (gastroesophageal reflux disease)   . Helicobacter pylori gastritis 2010   Pylera Tx  . History of cholecystectomy   . Hx of appendectomy   . Hx of cancer of lung 1999  . Hx of hysterectomy   . Hyperlipidemia   . Hypothyroidism   . Low back pain   . Non-STEMI (non-ST elevated myocardial infarction) ([place]) 11/2017   RCA PCI  . Osteoarthritis of knee    bilateral knee  . Seizures ([place])   . Stroke [facility])    CVA, hx of 97  . 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 ABDOMINAL HYSTERECTOMY     complete 1992  . ABDOMINAL  SURGERY     Exploratory  . APPENDECTOMY    . BALLOON DILATION N/A 11/08/2012   Procedure: BALLOON DILATION;  Surgeon: Gatha Mayer, MD;  [facility];  Service: Endoscopy;  Laterality: N/A;  . CHOLECYSTECTOMY    . COLONOSCOPY W/ BIOPSIES     multiple  . CORONARY STENT INTERVENTION N/A 06/28/2018   Procedure: CORONARY STENT INTERVENTION;  Surgeon: Martinique, Peter M, MD;  Location: [place] [facility];  Service: Cardiovascular;  Laterality: N/A;  90% prox RCA -> PCI with Synergy DES 3.5 mm x 16 mm (3.8 mm).  . CT CTA CORONARY W/CA SCORE W/CM &/OR WO/CM  05/2017   Coronary calcium score 2.9. Intermediate risk. Unusual noncalcified plaque in RCA --FFR negative  . ESOPHAGOGASTRODUODENOSCOPY     w/baloon x 2  . ESOPHAGOGASTRODUODENOSCOPY N/A 11/08/2012   Procedure: ESOPHAGOGASTRODUODENOSCOPY (EGD);  Surgeon: Gatha Mayer, MD;  [facility];  Service: Endoscopy;  Laterality: N/A;  . LEFT HEART CATH AND CORONARY ANGIOGRAPHY N/A 06/28/2018   Procedure: LEFT HEART CATH AND CORONARY ANGIOGRAPHY;  Surgeon: Martinique, Peter M, MD;  Location: [place] [facility];  Service: Cardiovascular:  90% prox RCA -> DES PCI. Ost-mLM 30%. Ost-prox Cx 40%.   EF 55-65%. LVEDP 15 mmHg.   [name] RIGHT HEART CATH N/A 07/11/2018   Procedure: RIGHT HEART CATH;   Surgeon: Leonie Man, MD;  Location: [place] [facility];  Service: Cardiovascular;  Laterality: N/A;  . TEE WITHOUT CARDIOVERSION N/A 07/11/2018   Procedure: TRANSESOPHAGEAL ECHOCARDIOGRAM (TEE);  Surgeon: [name], MD;  Location: [place];  Service: Cardiology;  Laterality: N/A;  . TOTAL HIP ARTHROPLASTY     Left  . TRANSTHORACIC ECHOCARDIOGRAM  04/2017   A) EF 60-65%. GR 1 DD. Mild AS with SEVERE AI;; B) Calcified aortic valve with mild stenosis and moderate to severe AI (visually appears severe, but holodiastolic flow reversal in the descending thoracic aorta not seen).  Mild to moderate MR.  EF 60 to 65% with GR 1 DD and no R WMA.  [name] TRANSTHORACIC ECHOCARDIOGRAM  05/2018   EF 60-65%.  No R WMA.  GR 1 DD.  Mild aortic stenosis with severe regurgitation.  Mild MR..  Only mild LV dilation noted.   Social History   Social History Narrative   Married   2 children   No regular exercise         family history includes Coronary artery disease in her mother and another family member; Diabetes in her mother and son; Hypertension in her father; Kidney cancer in her sister; Kidney disease in an other family member.   Review of Systems As per HPI

## 2018-11-28 ENCOUNTER — Other Ambulatory Visit: Payer: Self-pay | Admitting: Internal Medicine

## 2018-12-13 ENCOUNTER — Other Ambulatory Visit: Payer: Self-pay | Admitting: Internal Medicine

## 2018-12-18 ENCOUNTER — Other Ambulatory Visit: Payer: Self-pay

## 2018-12-18 DIAGNOSIS — C3490 Malignant neoplasm of unspecified part of unspecified bronchus or lung: Secondary | ICD-10-CM | POA: Diagnosis not present

## 2018-12-18 NOTE — Telephone Encounter (Signed)
Patient had to reschedule due to potential exposure but will need these prescriptions before appt

## 2018-12-19 ENCOUNTER — Ambulatory Visit: Payer: Medicare HMO | Admitting: Internal Medicine

## 2018-12-19 MED ORDER — ALPRAZOLAM 0.25 MG PO TABS: 0.2500 mg | ORAL_TABLET | Freq: Every evening | ORAL | 0 refills | Status: DC | PRN

## 2018-12-19 MED ORDER — HYDROCODONE-ACETAMINOPHEN 10-325 MG PO TABS: 1.0000 | ORAL_TABLET | Freq: Four times a day (QID) | ORAL | 0 refills | Status: DC | PRN

## 2018-12-19 MED ORDER — SUMATRIPTAN SUCCINATE 100 MG PO TABS: ORAL_TABLET | ORAL | 0 refills | Status: DC

## 2018-12-23 ENCOUNTER — Telehealth: Payer: Self-pay | Admitting: Internal Medicine

## 2018-12-23 NOTE — Telephone Encounter (Signed)
Pt has not been seen at office since 06/14/2017. Before med could be refilled for pt, pt would need an OV.  Called and spoke with pt in regards to the refill request. Pt stated she meant to call Dr. Judeen Hammans office in regards to the refill. Nothing further needed.

## 2018-12-25 HISTORY — PX: TRANSTHORACIC ECHOCARDIOGRAM: SHX275

## 2019-01-01 ENCOUNTER — Other Ambulatory Visit (INDEPENDENT_AMBULATORY_CARE_PROVIDER_SITE_OTHER): Payer: Medicare HMO

## 2019-01-01 ENCOUNTER — Encounter: Payer: Self-pay | Admitting: Internal Medicine

## 2019-01-01 ENCOUNTER — Ambulatory Visit (INDEPENDENT_AMBULATORY_CARE_PROVIDER_SITE_OTHER): Payer: Medicare HMO | Admitting: Internal Medicine

## 2019-01-01 ENCOUNTER — Other Ambulatory Visit: Payer: Self-pay

## 2019-01-01 DIAGNOSIS — K222 Esophageal obstruction: Secondary | ICD-10-CM | POA: Diagnosis not present

## 2019-01-01 DIAGNOSIS — M5440 Lumbago with sciatica, unspecified side: Secondary | ICD-10-CM | POA: Diagnosis not present

## 2019-01-01 DIAGNOSIS — G8929 Other chronic pain: Secondary | ICD-10-CM | POA: Diagnosis not present

## 2019-01-01 DIAGNOSIS — R296 Repeated falls: Secondary | ICD-10-CM

## 2019-01-01 DIAGNOSIS — J440 Chronic obstructive pulmonary disease with acute lower respiratory infection: Secondary | ICD-10-CM | POA: Diagnosis not present

## 2019-01-01 DIAGNOSIS — G40909 Epilepsy, unspecified, not intractable, without status epilepticus: Secondary | ICD-10-CM

## 2019-01-01 DIAGNOSIS — I10 Essential (primary) hypertension: Secondary | ICD-10-CM | POA: Diagnosis not present

## 2019-01-01 LAB — CBC WITH DIFFERENTIAL/PLATELET
Basophils Absolute: 0.1 10*3/uL (ref 0.0–0.1)
Basophils Relative: 1.1 % (ref 0.0–3.0)
Eosinophils Absolute: 0.2 10*3/uL (ref 0.0–0.7)
Eosinophils Relative: 3 % (ref 0.0–5.0)
HCT: 34.6 % — ABNORMAL LOW (ref 36.0–46.0)
Hemoglobin: 11.5 g/dL — ABNORMAL LOW (ref 12.0–15.0)
Lymphocytes Relative: 18.1 % (ref 12.0–46.0)
Lymphs Abs: 1.2 10*3/uL (ref 0.7–4.0)
MCHC: 33.3 g/dL (ref 30.0–36.0)
MCV: 96.5 fl (ref 78.0–100.0)
Monocytes Absolute: 0.5 10*3/uL (ref 0.1–1.0)
Monocytes Relative: 7.2 % (ref 3.0–12.0)
Neutro Abs: 4.6 10*3/uL (ref 1.4–7.7)
Neutrophils Relative %: 70.6 % (ref 43.0–77.0)
Platelets: 281 10*3/uL (ref 150.0–400.0)
RBC: 3.59 Mil/uL — ABNORMAL LOW (ref 3.87–5.11)
RDW: 14.1 % (ref 11.5–15.5)
WBC: 6.5 10*3/uL (ref 4.0–10.5)

## 2019-01-01 LAB — TSH: TSH: 1.84 u[IU]/mL (ref 0.35–4.50)

## 2019-01-01 LAB — BASIC METABOLIC PANEL
BUN: 12 mg/dL (ref 6–23)
CO2: 31 mEq/L (ref 19–32)
Calcium: 9 mg/dL (ref 8.4–10.5)
Chloride: 101 mEq/L (ref 96–112)
Creatinine, Ser: 0.94 mg/dL (ref 0.40–1.20)
GFR: 59.75 mL/min — ABNORMAL LOW (ref >60.00)
Glucose, Bld: 91 mg/dL (ref 70–99)
Potassium: 4.5 mEq/L (ref 3.5–5.1)
Sodium: 140 mEq/L (ref 135–145)

## 2019-01-01 LAB — HEPATIC FUNCTION PANEL
ALT: 13 U/L (ref 0–35)
AST: 13 U/L (ref 0–37)
Albumin: 3.6 g/dL (ref 3.5–5.2)
Alkaline Phosphatase: 106 U/L (ref 39–117)
Bilirubin, Direct: 0.1 mg/dL (ref 0.0–0.3)
Total Bilirubin: 0.2 mg/dL (ref 0.2–1.2)
Total Protein: 6.6 g/dL (ref 6.0–8.3)

## 2019-01-01 MED ORDER — HYDROCODONE-ACETAMINOPHEN 10-325 MG PO TABS: 1.0000 | ORAL_TABLET | Freq: Four times a day (QID) | ORAL | 0 refills | Status: DC | PRN

## 2019-01-01 MED ORDER — NALOXONE HCL 4 MG/0.1ML NA LIQD: NASAL | 3 refills | Status: DC

## 2019-01-01 NOTE — Assessment & Plan Note (Addendum)
Norco, (Fentanyl - d/c 7/17)  Potential benefits of a long term opioids use as well as potential risks (i.e. addiction risk, apnea etc) and complications (i.e. Somnolence, constipation and others) were explained to the patient and were aknowledged. Narcane nasal prn Do not take w/Xanax

## 2019-01-01 NOTE — Assessment & Plan Note (Signed)
NAS diet 

## 2019-01-01 NOTE — Assessment & Plan Note (Signed)
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  

## 2019-01-01 NOTE — Assessment & Plan Note (Signed)
Albuterol MDI/HHN

## 2019-01-01 NOTE — Progress Notes (Signed)
Subjective:  Patient ID: Kaylee Hansen, female    DOB: Jan 27, 1956  Age: 63 y.o. MRN: [id number]  CC: No chief complaint on file.   HPI Kaylee Hansen presents for COPD, LBP, depression f/u  Outpatient Medications Prior to Visit  Medication Sig Dispense Refill   acetaminophen (TYLENOL) 325 MG tablet Take 2 tablets (650 mg total) by mouth every 4 (four) hours as needed for headache or mild pain.     albuterol (PROVENTIL HFA;VENTOLIN HFA) 108 (90 Base) MCG/ACT inhaler Inhale 2 puffs into the lungs every 6 (six) hours as needed for wheezing or shortness of breath. 1 Inhaler 5   albuterol (PROVENTIL) (2.5 MG/3ML) 0.083% nebulizer solution Take 3 mLs (2.5 mg total) by nebulization every 4 (four) hours as needed for wheezing or shortness of breath. 150 mL 5   ALPRAZolam (XANAX) 0.25 MG tablet Take 1 tablet (0.25 mg total) by mouth at bedtime as needed for anxiety. 90 tablet 0   aspirin EC 81 MG tablet Take 1 tablet (81 mg total) by mouth daily. 90 tablet 3   bisacodyl (DULCOLAX) 5 MG EC tablet Take 2 tablets (10 mg total) by mouth at bedtime. 30 tablet    budesonide-formoterol (SYMBICORT) 160-4.5 MCG/ACT inhaler Inhale 2 puffs into the lungs 2 (two) times daily at 10 AM and 5 PM. Rinse mouth after each use 1 Inhaler 0   clopidogrel (PLAVIX) 75 MG tablet Take 1 tablet (75 mg total) by mouth daily with breakfast. 90 tablet 3   furosemide (LASIX) 20 MG tablet Take 1 tablet by mouth once daily 90 tablet 0   guaiFENesin (MUCINEX) 600 MG 12 hr tablet Take 1 tablet (600 mg total) by mouth 2 (two) times daily as needed for cough or to loosen phlegm. 14 tablet 0   HYDROcodone-acetaminophen (NORCO) 10-325 MG tablet Take 1 tablet by mouth every 6 (six) hours as needed for severe pain. Please fill on or after 12/19/18 120 tablet 0   isosorbide mononitrate (IMDUR) 30 MG 24 hr tablet TAKE 1 TABLET BY MOUTH ONCE DAILY 90 tablet 2   levothyroxine (SYNTHROID, LEVOTHROID) 150 MCG tablet Take 1 tablet  (150 mcg total) by mouth daily. 90 tablet 3   nitroGLYCERIN (NITROSTAT) 0.4 MG SL tablet DISSOLVE ONE TABLET UNDER THE TONGUE EVERY 5 MINUTES AS NEEDED FOR CHEST PAIN.  DO NOT EXCEED A TOTAL OF 3 DOSES IN 15 MINUTES 25 tablet 0   OXYGEN Inhale 2.5 L/min into the lungs at bedtime.     pantoprazole (PROTONIX) 40 MG tablet Take 1 tablet (40 mg total) by mouth daily before breakfast. 90 tablet 3   PARoxetine (PAXIL) 40 MG tablet Take 1 tablet (40 mg total) by mouth daily. 90 tablet 3   phenytoin (DILANTIN) 100 MG ER capsule TAKE 100 MG EVERY MORNING AND 200 MG EVERY NIGHT. (Patient taking differently: Take 100-200 mg by mouth See admin instructions. Take 1 capsule (100 mg) by mouth in the morning & take 2 capsules (200 mg) by mouth at night.) 270 capsule 3   polyethylene glycol (MIRALAX / GLYCOLAX) 17 g packet Take 17 g by mouth daily.     promethazine (PHENERGAN) 25 MG tablet Take 1 tablet (25 mg total) by mouth every 8 (eight) hours as needed for nausea or vomiting. 90 tablet 1   Respiratory Therapy Supplies (FLUTTER) DEVI As directed 1 each 0   rosuvastatin (CRESTOR) 40 MG tablet Take 1 tablet (40 mg total) by mouth daily. Discontinue 20 mg dose (Patient taking  differently: Take 40 mg by mouth daily. ) 90 tablet 3   senna-docusate (SENOKOT S) 8.6-50 MG tablet Take 2 tablets by mouth at bedtime.     SUMAtriptan (IMITREX) 100 MG tablet TAKE 1 TABLET BY MOUTH EVERY 2 HOURS AS NEEDED FOR MIAGRAINE/HEADACHE 9 tablet 0   traZODone (DESYREL) 100 MG tablet TAKE 1 TABLET BY MOUTH EVERYDAY AT BEDTIME (Patient taking differently: Take 100 mg by mouth at bedtime. ) 90 tablet 3   Vitamin D, Ergocalciferol, (DRISDOL) 50000 units CAPS capsule TAKE ONE CAPSULE BY MOUTH ONCE A WEEK (Patient taking differently: Take 50,000 Units by mouth every Monday. ) 12 capsule 2   diltiazem (CARDIZEM CD) 120 MG 24 hr capsule Take 1 capsule (120 mg total) by mouth at bedtime. 90 capsule 3   No facility-administered  medications prior to visit.     ROS: Review of Systems  Constitutional: Positive for fatigue. Negative for activity change, appetite change, chills, fever and unexpected weight change.  HENT: Negative for congestion, mouth sores and sinus pressure.   Eyes: Negative for visual disturbance.  Respiratory: Positive for cough and shortness of breath. Negative for chest tightness.   Gastrointestinal: Negative for abdominal pain and nausea.  Genitourinary: Negative for difficulty urinating, frequency and vaginal pain.  Musculoskeletal: Positive for arthralgias, back pain and gait problem.  Skin: Negative for pallor and rash.  Neurological: Negative for dizziness, tremors, weakness, numbness and headaches.  Psychiatric/Behavioral: Negative for confusion, sleep disturbance and suicidal ideas.    Objective:  BP (!) 130/56 (BP Location: Left Arm, Patient Position: Sitting, Cuff Size: Normal)    Pulse 80    Temp 98.5 F (36.9 C) (Oral)    Ht 5\' 4"  (1.626 m)    Wt 161 lb (73 kg)    SpO2 92%    BMI 27.64 kg/m   BP Readings from Last 3 Encounters:  01/01/19 (!) 130/56  10/02/18 (!) 139/43  09/16/18 (!) 106/40    Wt Readings from Last 3 Encounters:  01/01/19 161 lb (73 kg)  11/21/18 159 lb (72.1 kg)  10/24/18 150 lb (68 kg)    Physical Exam Constitutional:      General: She is not in acute distress.    Appearance: She is well-developed.  HENT:     Head: Normocephalic.     Right Ear: External ear normal.     Left Ear: External ear normal.     Nose: Nose normal.  Eyes:     General:        Right eye: No discharge.        Left eye: No discharge.     Conjunctiva/sclera: Conjunctivae normal.     Pupils: Pupils are equal, round, and reactive to light.  Neck:     Musculoskeletal: Normal range of motion and neck supple.     Thyroid: No thyromegaly.     Vascular: No JVD.     Trachea: No tracheal deviation.  Cardiovascular:     Rate and Rhythm: Normal rate and regular rhythm.     Heart  sounds: Normal heart sounds.  Pulmonary:     Effort: No respiratory distress.     Breath sounds: No stridor. Wheezing present.  Abdominal:     General: Bowel sounds are normal. There is no distension.     Palpations: Abdomen is soft. There is no mass.     Tenderness: There is no abdominal tenderness. There is no guarding or rebound.  Musculoskeletal:        General:  Tenderness present.  Lymphadenopathy:     Cervical: No cervical adenopathy.  Skin:    Findings: No erythema or rash.  Neurological:     Cranial Nerves: No cranial nerve deficit.     Motor: Weakness present. No abnormal muscle tone.     Coordination: Coordination abnormal.     Gait: Gait abnormal.     Deep Tendon Reflexes: Reflexes normal.  Psychiatric:        Behavior: Behavior normal.        Thought Content: Thought content normal.        Judgment: Judgment normal.    In a w/c LS painful w/ROM   Lab Results  Component Value Date   WBC 7.0 09/12/2018   HGB 12.0 09/12/2018   HCT 35.6 (L) 09/12/2018   PLT 282.0 09/12/2018   GLUCOSE 76 07/08/2018   CHOL 128 06/29/2018   TRIG 184 (H) 06/29/2018   HDL 35 (L) 06/29/2018   LDLDIRECT 187.0 07/14/2015   LDLCALC 56 06/29/2018   ALT 21 03/19/2018   AST 18 03/19/2018   NA 144 07/08/2018   K 4.5 07/08/2018   CL 102 07/08/2018   CREATININE 0.95 07/08/2018   BUN 12 07/08/2018   CO2 26 07/08/2018   TSH 0.46 07/14/2015   INR 1.0 09/16/2008   HGBA1C  09/17/2008    5.0 (NOTE)   The ADA recommends the following therapeutic goal for glycemic   control related to Hgb A1C measurement:   Goal of Therapy:   < 7.0% Hgb A1C   Reference: American Diabetes Association: Clinical Practice   Recommendations 2008, Diabetes Care,  2008, 31:(Suppl 1).    No results found.  Assessment & Plan:   There are no diagnoses linked to this encounter.   No orders of the defined types were placed in this encounter.    Follow-up: No follow-ups on file.  Walker Kehr, MD

## 2019-01-01 NOTE — Assessment & Plan Note (Signed)
Motorized w/c at home [employer]

## 2019-01-01 NOTE — Assessment & Plan Note (Signed)
Protonix.  ?

## 2019-01-02 LAB — PHENYTOIN LEVEL, TOTAL: Phenytoin, Total: 15.3 mg/L (ref 10.0–20.0)

## 2019-01-07 DIAGNOSIS — Z1231 Encounter for screening mammogram for malignant neoplasm of breast: Secondary | ICD-10-CM | POA: Diagnosis not present

## 2019-01-07 LAB — HM MAMMOGRAPHY

## 2019-01-08 ENCOUNTER — Other Ambulatory Visit: Payer: Self-pay

## 2019-01-08 ENCOUNTER — Ambulatory Visit (HOSPITAL_COMMUNITY): Payer: Medicare HMO | Attending: Cardiology

## 2019-01-08 DIAGNOSIS — I214 Non-ST elevation (NSTEMI) myocardial infarction: Secondary | ICD-10-CM | POA: Insufficient documentation

## 2019-01-08 DIAGNOSIS — I351 Nonrheumatic aortic (valve) insufficiency: Secondary | ICD-10-CM | POA: Diagnosis not present

## 2019-01-09 DIAGNOSIS — H5213 Myopia, bilateral: Secondary | ICD-10-CM | POA: Diagnosis not present

## 2019-01-09 DIAGNOSIS — H52203 Unspecified astigmatism, bilateral: Secondary | ICD-10-CM | POA: Diagnosis not present

## 2019-01-09 DIAGNOSIS — H524 Presbyopia: Secondary | ICD-10-CM | POA: Diagnosis not present

## 2019-01-09 DIAGNOSIS — H2513 Age-related nuclear cataract, bilateral: Secondary | ICD-10-CM | POA: Diagnosis not present

## 2019-01-10 ENCOUNTER — Encounter: Payer: Self-pay | Admitting: Internal Medicine

## 2019-01-10 DIAGNOSIS — M25562 Pain in left knee: Secondary | ICD-10-CM | POA: Diagnosis not present

## 2019-01-10 DIAGNOSIS — M25561 Pain in right knee: Secondary | ICD-10-CM | POA: Diagnosis not present

## 2019-01-10 DIAGNOSIS — M170 Bilateral primary osteoarthritis of knee: Secondary | ICD-10-CM | POA: Diagnosis not present

## 2019-01-12 DIAGNOSIS — S93401A Sprain of unspecified ligament of right ankle, initial encounter: Secondary | ICD-10-CM | POA: Diagnosis not present

## 2019-01-17 ENCOUNTER — Telehealth: Payer: Self-pay | Admitting: Cardiology

## 2019-01-17 DIAGNOSIS — C3490 Malignant neoplasm of unspecified part of unspecified bronchus or lung: Secondary | ICD-10-CM | POA: Diagnosis not present

## 2019-01-17 NOTE — Telephone Encounter (Signed)
LVM, reminding pt  Of her appt on 01-20-19 with Dr Ellyn Hack. I also left a message to call back to be pre-screened for COVID-19.

## 2019-01-20 ENCOUNTER — Ambulatory Visit: Payer: Medicare HMO | Admitting: Cardiology

## 2019-01-20 ENCOUNTER — Encounter: Payer: Self-pay | Admitting: Cardiology

## 2019-01-20 ENCOUNTER — Other Ambulatory Visit: Payer: Self-pay

## 2019-01-20 VITALS — BP 116/49 | HR 83 | Temp 97.3°F | Ht 64.0 in | Wt 161.4 lb

## 2019-01-20 DIAGNOSIS — E785 Hyperlipidemia, unspecified: Secondary | ICD-10-CM | POA: Diagnosis not present

## 2019-01-20 DIAGNOSIS — Z993 Dependence on wheelchair: Secondary | ICD-10-CM

## 2019-01-20 DIAGNOSIS — J449 Chronic obstructive pulmonary disease, unspecified: Secondary | ICD-10-CM | POA: Diagnosis not present

## 2019-01-20 DIAGNOSIS — Z720 Tobacco use: Secondary | ICD-10-CM | POA: Diagnosis not present

## 2019-01-20 DIAGNOSIS — I351 Nonrheumatic aortic (valve) insufficiency: Secondary | ICD-10-CM

## 2019-01-20 DIAGNOSIS — I25119 Atherosclerotic heart disease of native coronary artery with unspecified angina pectoris: Secondary | ICD-10-CM

## 2019-01-20 DIAGNOSIS — I10 Essential (primary) hypertension: Secondary | ICD-10-CM

## 2019-01-20 MED ORDER — DILTIAZEM HCL ER 180 MG PO CP24: 180.0000 mg | ORAL_CAPSULE | Freq: Every day | ORAL | 3 refills | Status: DC

## 2019-01-20 NOTE — Progress Notes (Signed)
PCP: Plotnikov, [name], MD  Clinic Note: Chief Complaint  Patient presents with  . Follow-up    3 month f/u echo no complaints today. Meds reviewed verbally with pt.  . Cardiac Valve Problem    Aortic regurgitation  . Coronary Artery Disease    RCA PCI    HPI: Kaylee Hansen is a 63 y.o. female with a complicated PMH Severe AI & now including recent non-STEMI-CAD-PCI (otherwise noted below), COPD (with Lung Ca - Chemo-XRT), etc who presents for ~3 month f/u.  She has been troubled with long-term issues of COPD, but also collagenous colitis and constant constipation.  She is debilitated with orthopedic problems and is essentially nonambulatory.  Wheelchair bound.  Chronic pain and severe weakness of the legs.  Also chronic exertional dyspnea.  I last saw her as hospital f/u in January 2020 --no more anginal type chest pressure.  Only some strange right-sided pressure.  Was having issues with migraines.  Also with constipation.  Still limited by significant dyspnea. -- referred to Dr. Roxy Manns (CVTS) - decided to delay surgery--suggested that conventional AVR would be high risk with her comorbidities.  Unsure if TAVR would be an option.  F/u Echo in 6 months, f/u PFTs (did not get PFTs done-voicing concerns about her pulmonologist).  Follow for signs of LV dilation.  Kaylee Hansen was last seen 10/02/2018 by Jory Sims, DNP by TeleHealth visit. ->  Noted continued baseline dyspnea.  No chest pain. --> No changes  Recent Hospitalizations:   June 28, 2018: Non-STEMI, cath and PCI.  Noted severe central chest pressure and tightness.  Troponin was 0.28.  Studies Personally Reviewed - (if available, images/films reviewed: From Epic Chart or Care Everywhere)  TTE 06/13/2018: EF 60-65%.  No R WMA.  GR 1 DD.  Mild aortic stenosis with severe regurgitation.  Mild MR..  Only mild LV dilation noted.   RHC & TEE Jan 16:  RHC: Relatively normal Right Heart Cath pressures: PA pressure  26/14 mmHg-mean 20 mmHg; PCWP 13 mmHg.  RAP 6 mmHg, RVP 28/3 mmHg-EDP 10 mmHg. Severely reduced cardiac output and index by Fick: 3.63 and 2.07.  TEE: EF 60-65%.  Severe aortic regurgitation-type III. (suggested by holodiastolic flow reversal and descending aorta-vena contracta 6 mm).  Echo 01/08/2019: EF 60-65%. Mild LVH -minimally dilated LV. Gr1 DD. Mod AoV thickening. Mod-Severe AI, ~ mlid AS.  Interval History: Kaylee Hansen returns here today as usual, with her husband.  She is in a wheelchair.  She is not using oxygen, (does not have portable oxygen, has a tank at home.)  She says that she does not necessarily notice being any more dyspneic than she had been in the past.  What she describes is not necessarily her anginal chest pain, she describes a tightness across her chest that can come and go without any particular activity.  It can occur at rest or with activity.  Sometimes with deep breathing.  She occasionally notes palpitations where she feels her heart rate going very fast with anxiety this leads to worsening dyspnea and even potentially some chest discomfort.  She cannot lie down flat to sleep, but denies any PND.  Trivial edema. No syncope/near syncope or TIA/amaurosis fugax. No melena, hematochezia, hematuria or epistaxis --only some blood on her toilet paper when she wipes due to constipation.  The biggest issue that is troubling her now is been constipation.  Sometimes he gets so "bloated "that she is unable to breathe.  She is not  eating well.  Does not walk, therefore unable to assess claudication.  ROS: A comprehensive was performed. Review of Systems  HENT: Negative for nosebleeds.   Eyes: Positive for photophobia. Negative for blurred vision.  Respiratory: Positive for cough, shortness of breath (Baseline) and wheezing.   Gastrointestinal: Positive for blood in stool (With wiping) and constipation. Negative for melena.  Genitourinary: Negative for hematuria.  Musculoskeletal:  Positive for back pain and joint pain (Knee pain). Negative for falls (None in the last couple months).  Skin: Negative.   Neurological: Positive for weakness (Globalized) and headaches (Much improved migraines). Negative for focal weakness.  Endo/Heme/Allergies: Bruises/bleeds easily.  All other systems reviewed and are negative.  The patient does not have symptoms concerning for COVID-19 infection (fever, chills, cough, or new shortness of breath).  The patient is practicing social distancing.  They are doing okay with getting food at home however.   COVID-19 Education: The signs and symptoms of COVID-19 were discussed with the patient and how to seek care for testing (follow up with PCP or arrange E-visit).   The importance of social distancing was discussed today.   I have reviewed and (if needed) personally updated the patient's problem list, medications, allergies, past medical and surgical history, social and family history.   Past Medical History:  Diagnosis Date  . Acute respiratory failure following trauma and surgery ([place]) 08/26/2012  . Anxiety   . Aortic insufficiency with aortic stenosis 04/2017   TTE December 2019: Mild AS with severe regurgitation.  Mild LA dilation.;;  TEE January 2016: Severe-type III aortic regurgitation (holodiastolic flow reversal in the a sending aorta & vena contracta =6.   . CAD S/P percutaneous coronary angioplasty 11/2017   Proximal RCA PCI Synergy DES 3.5 mm x 16 mm (3.8 mm). Ost-mLM 30%. Ost-prox Cx 40%.  . Collagenous colitis   . Colon adenomas 2011  . Constipation    Chronic abdominal pain and constipation  . COPD (chronic obstructive pulmonary disease) ([place])   . Depression   . Diverticulosis   . Esophageal stricture 11/08/2012   Ulcer noted in 2010  . GERD (gastroesophageal reflux disease)   . Helicobacter pylori gastritis 2010   Pylera Tx  . History of cholecystectomy   . Hx of appendectomy   . Hx of cancer of lung 1999  . Hx of  hysterectomy   . Hyperlipidemia   . Hypothyroidism   . Low back pain   . Non-STEMI (non-ST elevated myocardial infarction) ([place]) 11/2017   RCA PCI  . Osteoarthritis of knee    bilateral knee  . Seizures ([place])   . Stroke [facility])    CVA, hx of 97  . 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 ABDOMINAL HYSTERECTOMY     complete 1992  . ABDOMINAL SURGERY     Exploratory  . APPENDECTOMY    . BALLOON DILATION N/A 11/08/2012   Procedure: BALLOON DILATION;  Surgeon: Gatha Mayer, MD;  Location: [facility] ENDOSCOPY;  Service: Endoscopy;  Laterality: N/A;  . CHOLECYSTECTOMY    . COLONOSCOPY W/ BIOPSIES     multiple  . CORONARY STENT INTERVENTION N/A 06/28/2018   Procedure: CORONARY STENT INTERVENTION;  Surgeon: Martinique, Peter M, MD;  Location: [place] CV LAB;  Service: Cardiovascular;  Laterality: N/A;  90% prox RCA -> PCI with Synergy DES 3.5 mm x 16 mm (3.8 mm).  . CT CTA CORONARY W/CA SCORE W/CM &/OR WO/CM  05/2017  Coronary calcium score 2.9. Intermediate risk. Unusual noncalcified plaque in RCA --FFR negative  . ESOPHAGOGASTRODUODENOSCOPY     w/baloon x 2  . ESOPHAGOGASTRODUODENOSCOPY N/A 11/08/2012   Procedure: ESOPHAGOGASTRODUODENOSCOPY (EGD);  Surgeon: Gatha Mayer, MD;  Location: [facility];  Service: Endoscopy;  Laterality: N/A;  . LEFT HEART CATH AND CORONARY ANGIOGRAPHY N/A 06/28/2018   Procedure: LEFT HEART CATH AND CORONARY ANGIOGRAPHY;  Surgeon: Martinique, Peter M, MD;  Location: [place] CV LAB;  Service: Cardiovascular:  90% prox RCA -> DES PCI. Ost-mLM 30%. Ost-prox Cx 40%.   EF 55-65%. LVEDP 15 mmHg.   [name] RIGHT HEART CATH N/A 07/11/2018   Procedure: RIGHT HEART CATH;  Surgeon: Leonie Man, MD;  Location: [place] CV LAB;;   Relatively normal Right Heart Cath pressures: PA pressure 26/14 mmHg-mean 20 mmHg; PCWP 13 mmHg.  RAP 6 mmHg, RVP 28/3 mmHg-EDP 10 mmHg. Severely reduced cardiac output and index by Fick: 3.63 and 2.07.  . TEE  WITHOUT CARDIOVERSION N/A 07/11/2018   Procedure: TRANSESOPHAGEAL ECHOCARDIOGRAM (TEE);  Surgeon: [name], MD;  Location: [facility] ENDOSCOPY;  Service: Cardiology;;  Severe aortic regurgitation-type III. (suggested by holodiastolic flow reversal and descending aorta-vena contracta 6 mm).  . TOTAL HIP ARTHROPLASTY     Left  . TRANSTHORACIC ECHOCARDIOGRAM  05/2018   EF 60-65%.  No R WMA.  GR 1 DD.  Mild aortic stenosis with severe regurgitation.  Mild MR..  Only mild LV dilation noted.  . TRANSTHORACIC ECHOCARDIOGRAM  12/2018    EF 60-65%. Mild LVH. Gr1 DD. Mod AoV thickening. Mod-Severe AI, ~ mlid AS.    Current Meds  Medication Sig  . acetaminophen (TYLENOL) 325 MG tablet Take 2 tablets (650 mg total) by mouth every 4 (four) hours as needed for headache or mild pain.  [name] albuterol (PROVENTIL HFA;VENTOLIN HFA) 108 (90 Base) MCG/ACT inhaler Inhale 2 puffs into the lungs every 6 (six) hours as needed for wheezing or shortness of breath.  [name] albuterol (PROVENTIL) (2.5 MG/3ML) 0.083% nebulizer solution Take 3 mLs (2.5 mg total) by nebulization every 4 (four) hours as needed for wheezing or shortness of breath.  . ALPRAZolam (XANAX) 0.25 MG tablet Take 1 tablet (0.25 mg total) by mouth at bedtime as needed for anxiety.  . bisacodyl (DULCOLAX) 5 MG EC tablet Take 2 tablets (10 mg total) by mouth at bedtime.  . budesonide-formoterol (SYMBICORT) 160-4.5 MCG/ACT inhaler Inhale 2 puffs into the lungs 2 (two) times daily at 10 AM and 5 PM. Rinse mouth after each use  . clopidogrel (PLAVIX) 75 MG tablet Take 1 tablet (75 mg total) by mouth daily with breakfast.  . furosemide (LASIX) 20 MG tablet Take 1 tablet by mouth once daily  . guaiFENesin (MUCINEX) 600 MG 12 hr tablet Take 1 tablet (600 mg total) by mouth 2 (two) times daily as needed for cough or to loosen phlegm.  [name] HYDROcodone-acetaminophen (NORCO) 10-325 MG tablet Take 1 tablet by mouth every 6 (six) hours as needed for severe pain. Please fill on  or after 01/18/19  . HYDROcodone-acetaminophen (NORCO) 10-325 MG tablet Take 1 tablet by mouth every 6 (six) hours as needed for up to 5 days for severe pain.  [name] HYDROcodone-acetaminophen (NORCO) 10-325 MG tablet Take 1 tablet by mouth every 6 (six) hours as needed for up to 5 days for severe pain.  . isosorbide mononitrate (IMDUR) 30 MG 24 hr tablet TAKE 1 TABLET BY MOUTH ONCE DAILY  . levothyroxine (SYNTHROID, LEVOTHROID) 150 MCG tablet  Take 1 tablet (150 mcg total) by mouth daily.  . nitroGLYCERIN (NITROSTAT) 0.4 MG SL tablet DISSOLVE ONE TABLET UNDER THE TONGUE EVERY 5 MINUTES AS NEEDED FOR CHEST PAIN.  DO NOT EXCEED A TOTAL OF 3 DOSES IN 15 MINUTES  . OXYGEN Inhale 2.5 L/min into the lungs at bedtime.  . pantoprazole (PROTONIX) 40 MG tablet Take 1 tablet (40 mg total) by mouth daily before breakfast.  . PARoxetine (PAXIL) 40 MG tablet Take 1 tablet (40 mg total) by mouth daily.  . phenytoin (DILANTIN) 100 MG ER capsule TAKE 100 MG EVERY MORNING AND 200 MG EVERY NIGHT. (Patient taking differently: Take 100-200 mg by mouth See admin instructions. Take 1 capsule (100 mg) by mouth in the morning & take 2 capsules (200 mg) by mouth at night.)  . polyethylene glycol (MIRALAX / GLYCOLAX) 17 g packet Take 17 g by mouth daily.  . promethazine (PHENERGAN) 25 MG tablet Take 1 tablet (25 mg total) by mouth every 8 (eight) hours as needed for nausea or vomiting.  [name] Respiratory Therapy Supplies (FLUTTER) DEVI As directed  . rosuvastatin (CRESTOR) 40 MG tablet Take 1 tablet (40 mg total) by mouth daily. Discontinue 20 mg dose (Patient taking differently: Take 40 mg by mouth daily. )  . senna-docusate (SENOKOT S) 8.6-50 MG tablet Take 2 tablets by mouth at bedtime.  . SUMAtriptan (IMITREX) 100 MG tablet TAKE 1 TABLET BY MOUTH EVERY 2 HOURS AS NEEDED FOR MIAGRAINE/HEADACHE  . traZODone (DESYREL) 100 MG tablet TAKE 1 TABLET BY MOUTH EVERYDAY AT BEDTIME (Patient taking differently: Take 100 mg by mouth at bedtime.  )  . Vitamin D, Ergocalciferol, (DRISDOL) 50000 units CAPS capsule TAKE ONE CAPSULE BY MOUTH ONCE A WEEK (Patient taking differently: Take 50,000 Units by mouth every Monday. )  . [DISCONTINUED] diltiazem (CARDIZEM CD) 120 MG 24 hr capsule Take 1 capsule (120 mg total) by mouth at bedtime.    Allergies  Allergen Reactions  . Morphine     "Makes me go crazy."   . Nitrofuran Derivatives     ??confusion  . Oxycodone Hcl Other (See Comments)    Knocked her out for 6 days  . Penicillins Hives    Has patient had a PCN reaction causing immediate rash, facial/tongue/throat swelling, SOB or lightheadedness with hypotension: unknown Has patient had a PCN reaction causing severe rash involving mucus membranes or skin necrosis: unknown Has patient had a PCN reaction that required hospitalization No Has patient had a PCN reaction occurring within the last 10 years: No If all of the above answers are "NO", then may proceed with Cephalosporin use.   . Tape Other (See Comments)    Skin turns red and burns    Social History   Tobacco Use  . Smoking status: Current Every Day Smoker    Packs/day: 1.00    Years: 48.00    Pack years: 48.00    Types: Cigarettes  . Smokeless tobacco: Never Used  Substance Use Topics  . Alcohol use: No  . Drug use: No   Social History   Social History Narrative   Married   2 children   No regular exercise          family history includes Coronary artery disease in her mother and another family member; Diabetes in her mother and son; Hypertension in her father; Kidney cancer in her sister; Kidney disease in an other family member.  Wt Readings from Last 3 Encounters:  01/20/19 161 lb 6 oz (  73.2 kg)  01/01/19 161 lb (73 kg)  11/21/18 159 lb (72.1 kg)    PHYSICAL EXAM BP (!) 116/49 (BP Location: Right Arm, Patient Position: Sitting, Cuff Size: Normal)   Pulse 83   Temp (!) 97.3 F (36.3 C)   Ht 5\' 4"  (1.626 m)   Wt 161 lb 6 oz (73.2 kg)   SpO2 97%    BMI 27.70 kg/m  Physical Exam  Constitutional: She is oriented to person, place, and time. No distress (Just chronically ill-appearing but nontoxic).  Chronically ill-appearing.  Lying on the examining table with the eye mask on because of migraine headache.  Appears older than stated age.  Pervasive cigarette odor.  HENT:  Head: Normocephalic.  Neck: Normal range of motion. Neck supple. Decreased carotid pulses (Has bifid pulse) present. No JVD present. Carotid bruit is not present (Radiated murmur). No tracheal deviation present. No thyromegaly present.  Cardiovascular: Normal rate, regular rhythm, S1 normal and S2 normal.  No extrasystoles are present. PMI is not displaced. Exam reveals distant heart sounds and decreased pulses (Mildly decreased pedal pulses but palpable). Exam reveals no gallop.  Murmur heard.  Low-pitched harsh crescendo-decrescendo early systolic murmur is present with a grade of 1/6 at the upper right sternal border radiating to the neck.  Low-pitched rumbling early diastolic murmur is present with a grade of 2/6 at the upper left sternal border. Normal Allen's test bilaterally.  Pulmonary/Chest: Effort normal. No respiratory distress. She has no rales. She exhibits no tenderness.  Diffuse coarse rhonchi throughout with both inspiratory and expiratory wheezes.  Makes the remainder the chest auscultation difficult.  Abdominal: Soft. Bowel sounds are normal. She exhibits no distension. There is no abdominal tenderness. There is no rebound.  Musculoskeletal: Normal range of motion.        General: No edema.  Neurological: She is alert and oriented to person, place, and time.  Skin: Skin is warm and dry. No rash noted.  Thick leathery skin.  Mild upper extremity small bruises  Psychiatric: Judgment normal.  As usual, seems somewhat depressed and anhedonic.  Vitals reviewed.    Adult ECG Report  Rate: 83;  Rhythm: normal sinus rhythm.  Anterolateral MI --RSR prime no  longer present.  Narrative Interpretation: Relatively normally   Other studies Reviewed: Additional studies/ records that were reviewed today include:  Recent Labs:   CBC Latest Ref Rng & Units 01/01/2019 09/12/2018 07/08/2018  WBC 4.0 - 10.5 K/uL 6.5 7.0 9.1  Hemoglobin 12.0 - 15.0 g/dL 11.5(L) 12.0 12.1  Hematocrit 36.0 - 46.0 % 34.6(L) 35.6(L) 35.2  Platelets 150.0 - 400.0 K/uL 281.0 282.0 268   Lab Results  Component Value Date   CREATININE 0.94 01/01/2019   BUN 12 01/01/2019   NA 140 01/01/2019   K 4.5 01/01/2019   CL 101 01/01/2019   CO2 31 01/01/2019   Lab Results  Component Value Date   CHOL 128 06/29/2018   HDL 35 (L) 06/29/2018   LDLCALC 56 06/29/2018   LDLDIRECT 187.0 07/14/2015   TRIG 184 (H) 06/29/2018   CHOLHDL 3.7 06/29/2018  --This indicates a dramatic drop in LDL from 142 in September 2019.  Total cholesterol is down from 228  ASSESSMENT / PLAN: Problem List Items Addressed This Visit    Wheelchair dependence    She is now essentially wheelchair-bound.  Completely immobilized by musculoskeletal issues and now profound dyspnea.  This makes her even less of a viable candidate for major cardiac surgery.  I I suspect  that unless TAVR is approved for aortic regurgitation that she would not be a candidate for surgical repair.      Tobacco abuse disorder (Chronic)    She still smoking.  We talked about it, but she did not seem interested in stopping.      Severe Stage C1 aortic regurgitation by prior echocardiography - Primary (Chronic)    Appears to have stable severe aortic regurgitation.  Thankfully, her EF is still 60 to 65% and the LV is minimally dilated. I agree with Dr. Roxy Manns that conventional aortic valve replacement would be very high risk in this patient.  As yet TAVR has not been fully vetted with AI patients.  We will recheck echo in 6 months, but if stable would then probably go back to yearly.  The fact that she is sedentary and has pretty  significant COPD makes is very difficult to assess.  For now we will continue to monitor symptoms and follow LVEF and size.      Relevant Medications   diltiazem (DILACOR XR) 180 MG 24 hr capsule   Other Relevant Orders   EKG 12-Lead (Completed)   ECHOCARDIOGRAM COMPLETE   Hyperlipidemia with target LDL less than 100 (Chronic)    Would be due to have labs checked about time I see her again if not already checked.  Is now on rosuvastatin.  Seemingly tolerating it relatively well.      Relevant Medications   diltiazem (DILACOR XR) 180 MG 24 hr capsule   Essential hypertension, benign (Chronic)    Blood pressure looks pretty good.  Is probably not as much hypertension.  She is on the diltiazem for heart rate control and vasodilation both for antianginal benefit and for pulmonary hypertension issues. With heart rate of 83 bpm and some chest tightness, will increase to 180 mg daily.      Relevant Medications   diltiazem (DILACOR XR) 180 MG 24 hr capsule   Coronary artery disease involving native coronary artery of native heart with angina pectoris (HCC) (Chronic)    Single-vessel disease now with PCI of the LAD.  Is currently on Plavix alone.  Is also on Imdur.  I am increasing diltiazem for additional antianginal effect. Is on rosuvastatin.  She has a pending cataract surgery.  If urgent, were now 6 months out is probably okay to hold Plavix for surgery, however would prefer to delay as long as possible.  Optimally 1 year out would be best, but if vision issues are getting worse, based on adding a short RCA stent, would probably be okay holding Plavix.      Relevant Medications   diltiazem (DILACOR XR) 180 MG 24 hr capsule   Other Relevant Orders   EKG 12-Lead (Completed)   ECHOCARDIOGRAM COMPLETE   COPD (chronic obstructive pulmonary disease) (HCC)    Still smoking.  Is on lots of medications.  We order relook pulmonary function tests at next visit.      Relevant Orders    ECHOCARDIOGRAM COMPLETE      I spent a total of 30 minutes with the patient and chart review. >  50% of the time was spent in direct patient consultation.  Additional 10 to 50 minutes spent with charting-reviewing cardiac procedures and consultation notes.  Current medicines are reviewed at length with the patient today.  (+/- concerns) n/a The following changes have been made:  see below.   Patient Instructions  Medication Instructions:    - INCREASE DILTIAZEM  180 MG  ONE CAPSULE DAILY  If you need a refill on your cardiac medications before your next appointment, please call your pharmacy.   Lab work: NOT NEEDED   Testing/Procedures: Will be schedule at [place] Your physician has requested that you have an echocardiogram. Echocardiography is a painless test that uses sound waves to create images of your heart. It provides your doctor with information about the size and shape of your heart and how well your heart's chambers and valves are working. This procedure takes approximately one hour. There are no restrictions for this procedure.    Follow-Up: At [facility], you and your health needs are our priority.  As part of our continuing mission to provide you with exceptional heart care, we have created designated Provider Care Teams.  These Care Teams include your primary Cardiologist (physician) and Advanced Practice Providers (APPs -  Physician Assistants and Nurse Practitioners) who all work together to provide you with the care you need, when you need it. . You will need a follow up appointment in 7 months Feb 2021.  Please call our office 2 months in advance to schedule this appointment.  You may see [name], MD or one of the following Advanced Practice Providers on your designated Care Team:   . Rosaria Ferries, PA-C . Jory Sims, DNP, ANP  Any Other Special Instructions Will Be Listed Below (If Applicable).   Studies  Ordered:   Orders Placed This Encounter  Procedures  . EKG 12-Lead  . ECHOCARDIOGRAM COMPLETE      [name], M.D., M.S. Interventional Cardiologist   Pager # [phone] Phone # [phone] [address] [place], [place] [address]   Thank you for choosing Heartcare at [facility]!!

## 2019-01-20 NOTE — Patient Instructions (Addendum)
Medication Instructions:    - INCREASE DILTIAZEM  180 MG  ONE CAPSULE DAILY  If you need a refill on your cardiac medications before your next appointment, please call your pharmacy.   Lab work: NOT NEEDED   Testing/Procedures: Will be schedule at [place] Your physician has requested that you have an echocardiogram. Echocardiography is a painless test that uses sound waves to create images of your heart. It provides your doctor with information about the size and shape of your heart and how well your heart's chambers and valves are working. This procedure takes approximately one hour. There are no restrictions for this procedure.    Follow-Up: At [facility], you and your health needs are our priority.  As part of our continuing mission to provide you with exceptional heart care, we have created designated Provider Care Teams.  These Care Teams include your primary Cardiologist (physician) and Advanced Practice Providers (APPs -  Physician Assistants and Nurse Practitioners) who all work together to provide you with the care you need, when you need it. . You will need a follow up appointment in 7 months Feb 2021.  Please call our office 2 months in advance to schedule this appointment.  You may see [name], MD or one of the following Advanced Practice Providers on your designated Care Team:   . Rosaria Ferries, PA-C . Jory Sims, DNP, ANP  Any Other Special Instructions Will Be Listed Below (If Applicable).

## 2019-01-21 ENCOUNTER — Encounter: Payer: Self-pay | Admitting: Cardiology

## 2019-01-21 NOTE — Assessment & Plan Note (Signed)
She is now essentially wheelchair-bound.  Completely immobilized by musculoskeletal issues and now profound dyspnea.  This makes her even less of a viable candidate for major cardiac surgery.  I I suspect that unless TAVR is approved for aortic regurgitation that she would not be a candidate for surgical repair.

## 2019-01-21 NOTE — Assessment & Plan Note (Signed)
Single-vessel disease now with PCI of the LAD.  Is currently on Plavix alone.  Is also on Imdur.  I am increasing diltiazem for additional antianginal effect. Is on rosuvastatin.  She has a pending cataract surgery.  If urgent, were now 6 months out is probably okay to hold Plavix for surgery, however would prefer to delay as long as possible.  Optimally 1 year out would be best, but if vision issues are getting worse, based on adding a short RCA stent, would probably be okay holding Plavix.

## 2019-01-21 NOTE — Assessment & Plan Note (Signed)
Still smoking.  Is on lots of medications.  We order relook pulmonary function tests at next visit.

## 2019-01-21 NOTE — Assessment & Plan Note (Addendum)
Appears to have stable severe aortic regurgitation.  Thankfully, her EF is still 60 to 65% and the LV is minimally dilated. I agree with Dr. Roxy Manns that conventional aortic valve replacement would be very high risk in this patient.  As yet TAVR has not been fully vetted with AI patients.  We will recheck echo in 6 months, but if stable would then probably go back to yearly.  The fact that she is sedentary and has pretty significant COPD makes is very difficult to assess.  For now we will continue to monitor symptoms and follow LVEF and size.

## 2019-01-21 NOTE — Assessment & Plan Note (Signed)
She still smoking.  We talked about it, but she did not seem interested in stopping.

## 2019-01-21 NOTE — Assessment & Plan Note (Signed)
Blood pressure looks pretty good.  Is probably not as much hypertension.  She is on the diltiazem for heart rate control and vasodilation both for antianginal benefit and for pulmonary hypertension issues. With heart rate of 83 bpm and some chest tightness, will increase to 180 mg daily.

## 2019-01-21 NOTE — Assessment & Plan Note (Signed)
Would be due to have labs checked about time I see her again if not already checked.  Is now on rosuvastatin.  Seemingly tolerating it relatively well.

## 2019-01-22 ENCOUNTER — Other Ambulatory Visit: Payer: Self-pay | Admitting: Cardiology

## 2019-01-24 ENCOUNTER — Other Ambulatory Visit: Payer: Self-pay | Admitting: Cardiology

## 2019-01-24 NOTE — Telephone Encounter (Signed)
Rx request sent to pharmacy.  

## 2019-01-27 DIAGNOSIS — H2513 Age-related nuclear cataract, bilateral: Secondary | ICD-10-CM | POA: Diagnosis not present

## 2019-02-05 DIAGNOSIS — H2511 Age-related nuclear cataract, right eye: Secondary | ICD-10-CM | POA: Diagnosis not present

## 2019-02-07 DIAGNOSIS — M25561 Pain in right knee: Secondary | ICD-10-CM | POA: Diagnosis not present

## 2019-02-07 DIAGNOSIS — M1711 Unilateral primary osteoarthritis, right knee: Secondary | ICD-10-CM | POA: Diagnosis not present

## 2019-02-07 DIAGNOSIS — M25562 Pain in left knee: Secondary | ICD-10-CM | POA: Diagnosis not present

## 2019-02-07 DIAGNOSIS — M1712 Unilateral primary osteoarthritis, left knee: Secondary | ICD-10-CM | POA: Diagnosis not present

## 2019-02-12 ENCOUNTER — Other Ambulatory Visit: Payer: Self-pay | Admitting: Internal Medicine

## 2019-02-12 DIAGNOSIS — H2512 Age-related nuclear cataract, left eye: Secondary | ICD-10-CM | POA: Diagnosis not present

## 2019-02-17 ENCOUNTER — Other Ambulatory Visit: Payer: Self-pay | Admitting: Cardiology

## 2019-02-17 DIAGNOSIS — C3490 Malignant neoplasm of unspecified part of unspecified bronchus or lung: Secondary | ICD-10-CM | POA: Diagnosis not present

## 2019-03-06 ENCOUNTER — Other Ambulatory Visit: Payer: Self-pay | Admitting: Internal Medicine

## 2019-03-12 DIAGNOSIS — Z961 Presence of intraocular lens: Secondary | ICD-10-CM | POA: Diagnosis not present

## 2019-03-20 DIAGNOSIS — C3490 Malignant neoplasm of unspecified part of unspecified bronchus or lung: Secondary | ICD-10-CM | POA: Diagnosis not present

## 2019-03-25 ENCOUNTER — Other Ambulatory Visit: Payer: Self-pay | Admitting: Internal Medicine

## 2019-04-03 ENCOUNTER — Ambulatory Visit (INDEPENDENT_AMBULATORY_CARE_PROVIDER_SITE_OTHER): Payer: Medicare HMO | Admitting: Internal Medicine

## 2019-04-03 ENCOUNTER — Other Ambulatory Visit: Payer: Self-pay

## 2019-04-03 ENCOUNTER — Encounter: Payer: Self-pay | Admitting: Internal Medicine

## 2019-04-03 VITALS — BP 124/50 | HR 79 | Temp 98.5°F | Ht 64.0 in | Wt 161.0 lb

## 2019-04-03 DIAGNOSIS — M5440 Lumbago with sciatica, unspecified side: Secondary | ICD-10-CM

## 2019-04-03 DIAGNOSIS — R296 Repeated falls: Secondary | ICD-10-CM | POA: Diagnosis not present

## 2019-04-03 DIAGNOSIS — J449 Chronic obstructive pulmonary disease, unspecified: Secondary | ICD-10-CM

## 2019-04-03 DIAGNOSIS — I25119 Atherosclerotic heart disease of native coronary artery with unspecified angina pectoris: Secondary | ICD-10-CM

## 2019-04-03 DIAGNOSIS — N3281 Overactive bladder: Secondary | ICD-10-CM | POA: Diagnosis not present

## 2019-04-03 DIAGNOSIS — Z23 Encounter for immunization: Secondary | ICD-10-CM | POA: Diagnosis not present

## 2019-04-03 DIAGNOSIS — G8929 Other chronic pain: Secondary | ICD-10-CM | POA: Diagnosis not present

## 2019-04-03 MED ORDER — KETOCONAZOLE 2 % EX CREA: 1.0000 "application " | TOPICAL_CREAM | Freq: Every day | CUTANEOUS | 1 refills | Status: DC

## 2019-04-03 MED ORDER — HYDROCODONE-ACETAMINOPHEN 10-325 MG PO TABS: 1.0000 | ORAL_TABLET | Freq: Four times a day (QID) | ORAL | 0 refills | Status: DC | PRN

## 2019-04-03 MED ORDER — ALPRAZOLAM 0.25 MG PO TABS: 0.2500 mg | ORAL_TABLET | Freq: Every evening | ORAL | 2 refills | Status: DC | PRN

## 2019-04-03 NOTE — Assessment & Plan Note (Signed)
No angina DOE is worse w/a mask on

## 2019-04-03 NOTE — Assessment & Plan Note (Signed)
  DOE is worse w/a mask on

## 2019-04-03 NOTE — Progress Notes (Signed)
Subjective:  Patient ID: Kaylee Hansen, female    DOB: 04-16-1956  Age: 63 y.o. MRN: [id number]  CC: No chief complaint on file.   HPI Kaylee Hansen presents for chronic pain, seizure disorder, OAB f/u  Outpatient Medications Prior to Visit  Medication Sig Dispense Refill   acetaminophen (TYLENOL) 325 MG tablet Take 2 tablets (650 mg total) by mouth every 4 (four) hours as needed for headache or mild pain.     albuterol (PROVENTIL HFA;VENTOLIN HFA) 108 (90 Base) MCG/ACT inhaler Inhale 2 puffs into the lungs every 6 (six) hours as needed for wheezing or shortness of breath. 1 Inhaler 5   albuterol (PROVENTIL) (2.5 MG/3ML) 0.083% nebulizer solution Take 3 mLs (2.5 mg total) by nebulization every 4 (four) hours as needed for wheezing or shortness of breath. 150 mL 5   ALPRAZolam (XANAX) 0.25 MG tablet Take 1 tablet (0.25 mg total) by mouth at bedtime as needed for anxiety. 90 tablet 0   bisacodyl (DULCOLAX) 5 MG EC tablet Take 2 tablets (10 mg total) by mouth at bedtime. 30 tablet    budesonide-formoterol (SYMBICORT) 160-4.5 MCG/ACT inhaler Inhale 2 puffs into the lungs 2 (two) times daily at 10 AM and 5 PM. Rinse mouth after each use 1 Inhaler 0   clopidogrel (PLAVIX) 75 MG tablet Take 1 tablet (75 mg total) by mouth daily with breakfast. 90 tablet 3   diltiazem (CARDIZEM SR) 90 MG 12 hr capsule Please specify directions, refills and quantity 30 capsule 3   furosemide (LASIX) 20 MG tablet Take 1 tablet by mouth once daily 90 tablet 0   guaiFENesin (MUCINEX) 600 MG 12 hr tablet Take 1 tablet (600 mg total) by mouth 2 (two) times daily as needed for cough or to loosen phlegm. 14 tablet 0   HYDROcodone-acetaminophen (NORCO) 10-325 MG tablet Take 1 tablet by mouth every 6 (six) hours as needed for severe pain. Please fill on or after 01/18/19 120 tablet 0   isosorbide mononitrate (IMDUR) 30 MG 24 hr tablet TAKE 1 TABLET BY MOUTH ONCE DAILY 90 tablet 2   levothyroxine (SYNTHROID,  LEVOTHROID) 150 MCG tablet Take 1 tablet (150 mcg total) by mouth daily. 90 tablet 3   nitroGLYCERIN (NITROSTAT) 0.4 MG SL tablet DISSOLVE ONE TABLET UNDER THE TONGUE EVERY 5 MINUTES AS NEEDED FOR CHEST PAIN.  DO NOT EXCEED A TOTAL OF 3 DOSES IN 15 MINUTES 25 tablet 0   OXYGEN Inhale 2.5 L/min into the lungs at bedtime.     pantoprazole (PROTONIX) 40 MG tablet Take 1 tablet (40 mg total) by mouth daily before breakfast. 90 tablet 3   PARoxetine (PAXIL) 40 MG tablet Take 1 tablet (40 mg total) by mouth daily. 90 tablet 3   phenytoin (DILANTIN) 100 MG ER capsule TAKE 100 MG EVERY MORNING AND 200 MG EVERY NIGHT. 270 capsule 3   polyethylene glycol (MIRALAX / GLYCOLAX) 17 g packet Take 17 g by mouth daily.     promethazine (PHENERGAN) 25 MG tablet Take 1 tablet (25 mg total) by mouth every 8 (eight) hours as needed for nausea or vomiting. 90 tablet 1   Respiratory Therapy Supplies (FLUTTER) DEVI As directed 1 each 0   rosuvastatin (CRESTOR) 40 MG tablet Take 1 tablet (40 mg total) by mouth daily. Discontinue 20 mg dose 90 tablet 1   senna-docusate (SENOKOT S) 8.6-50 MG tablet Take 2 tablets by mouth at bedtime.     SUMAtriptan (IMITREX) 100 MG tablet TAKE 1 TABLET BY  MOUTH EVERY 2 HOURS AS NEEDED FOR MIAGRAINE/HEADACHE 9 tablet 0   traZODone (DESYREL) 100 MG tablet TAKE 1 TABLET BY MOUTH EVERYDAY AT BEDTIME (Patient taking differently: Take 100 mg by mouth at bedtime. ) 90 tablet 3   Vitamin D, Ergocalciferol, (DRISDOL) 1.25 MG (50000 UT) CAPS capsule TAKE ONE CAPSULE BY MOUTH ONE TIME PER WEEK 12 capsule 3   HYDROcodone-acetaminophen (NORCO) 10-325 MG tablet Take 1 tablet by mouth every 6 (six) hours as needed for up to 5 days for severe pain. 120 tablet 0   HYDROcodone-acetaminophen (NORCO) 10-325 MG tablet Take 1 tablet by mouth every 6 (six) hours as needed for up to 5 days for severe pain. 120 tablet 0   No facility-administered medications prior to visit.     ROS: Review of  Systems  Constitutional: Positive for fatigue. Negative for activity change, appetite change, chills, fever and unexpected weight change.  HENT: Negative for congestion, mouth sores and sinus pressure.   Eyes: Negative for visual disturbance.  Respiratory: Positive for shortness of breath. Negative for cough and chest tightness.   Gastrointestinal: Negative for abdominal pain and nausea.  Genitourinary: Negative for difficulty urinating, frequency and vaginal pain.  Musculoskeletal: Negative for back pain and gait problem.  Skin: Negative for pallor and rash.  Neurological: Positive for weakness. Negative for dizziness, tremors, numbness and headaches.  Psychiatric/Behavioral: Positive for decreased concentration, dysphoric mood and sleep disturbance. Negative for confusion and suicidal ideas. The patient is nervous/anxious.     Objective:  BP (!) 124/50 (BP Location: Left Arm, Patient Position: Sitting, Cuff Size: Normal)    Pulse 79    Temp 98.5 F (36.9 C) (Oral)    Ht 5\' 4"  (1.626 m)    Wt 161 lb (73 kg)    SpO2 94%    BMI 27.64 kg/m   BP Readings from Last 3 Encounters:  04/03/19 (!) 124/50  01/20/19 (!) 116/49  01/01/19 (!) 130/56    Wt Readings from Last 3 Encounters:  04/03/19 161 lb (73 kg)  01/20/19 161 lb 6 oz (73.2 kg)  01/01/19 161 lb (73 kg)    Physical Exam Constitutional:      General: She is not in acute distress.    Appearance: She is well-developed.  HENT:     Head: Normocephalic.     Right Ear: External ear normal.     Left Ear: External ear normal.     Nose: Nose normal.  Eyes:     General:        Right eye: No discharge.        Left eye: No discharge.     Conjunctiva/sclera: Conjunctivae normal.     Pupils: Pupils are equal, round, and reactive to light.  Neck:     Musculoskeletal: Normal range of motion and neck supple.     Thyroid: No thyromegaly.     Vascular: No JVD.     Trachea: No tracheal deviation.  Cardiovascular:     Rate and Rhythm:  Normal rate and regular rhythm.     Heart sounds: Normal heart sounds.  Pulmonary:     Effort: No respiratory distress.     Breath sounds: No stridor. No wheezing.  Abdominal:     General: Bowel sounds are normal. There is no distension.     Palpations: Abdomen is soft. There is no mass.     Tenderness: There is no abdominal tenderness. There is no guarding or rebound.  Musculoskeletal:  General: No tenderness.  Lymphadenopathy:     Cervical: No cervical adenopathy.  Skin:    Findings: No erythema or rash.  Neurological:     Cranial Nerves: No cranial nerve deficit.     Motor: Weakness present. No abnormal muscle tone.     Coordination: Coordination abnormal.     Gait: Gait abnormal.     Deep Tendon Reflexes: Reflexes normal.  Psychiatric:        Behavior: Behavior normal.        Thought Content: Thought content normal.    In a w/c   Lab Results  Component Value Date   WBC 6.5 01/01/2019   HGB 11.5 (L) 01/01/2019   HCT 34.6 (L) 01/01/2019   PLT 281.0 01/01/2019   GLUCOSE 91 01/01/2019   CHOL 128 06/29/2018   TRIG 184 (H) 06/29/2018   HDL 35 (L) 06/29/2018   LDLDIRECT 187.0 07/14/2015   LDLCALC 56 06/29/2018   ALT 13 01/01/2019   AST 13 01/01/2019   NA 140 01/01/2019   K 4.5 01/01/2019   CL 101 01/01/2019   CREATININE 0.94 01/01/2019   BUN 12 01/01/2019   CO2 31 01/01/2019   TSH 1.84 01/01/2019   INR 1.0 09/16/2008   HGBA1C  09/17/2008    5.0 (NOTE)   The ADA recommends the following therapeutic goal for glycemic   control related to Hgb A1C measurement:   Goal of Therapy:   < 7.0% Hgb A1C   Reference: American Diabetes Association: Clinical Practice   Recommendations 2008, Diabetes Care,  2008, 31:(Suppl 1).    No results found.  Assessment & Plan:   There are no diagnoses linked to this encounter.   No orders of the defined types were placed in this encounter.    Follow-up: No follow-ups on file.  Walker Kehr, MD

## 2019-04-03 NOTE — Assessment & Plan Note (Signed)
No new sx's

## 2019-04-03 NOTE — Assessment & Plan Note (Signed)
No recent falls

## 2019-04-03 NOTE — Assessment & Plan Note (Signed)
Chronic Norco  Potential benefits of a long term opioids use as well as potential risks (i.e. addiction risk, apnea etc) and complications (i.e. Somnolence, constipation and others) were explained to the patient and were aknowledged. Narcane nasal prn Do not take w/Xanax

## 2019-04-04 NOTE — Addendum Note (Signed)
Addended by: Karren Cobble on: 04/04/2019 02:40 PM   Modules accepted: Orders

## 2019-04-17 ENCOUNTER — Other Ambulatory Visit: Payer: Self-pay | Admitting: Internal Medicine

## 2019-04-17 ENCOUNTER — Other Ambulatory Visit: Payer: Self-pay | Admitting: Cardiology

## 2019-04-19 DIAGNOSIS — C3490 Malignant neoplasm of unspecified part of unspecified bronchus or lung: Secondary | ICD-10-CM | POA: Diagnosis not present

## 2019-04-21 ENCOUNTER — Other Ambulatory Visit: Payer: Self-pay

## 2019-04-21 ENCOUNTER — Emergency Department (HOSPITAL_COMMUNITY): Payer: Medicare HMO

## 2019-04-21 ENCOUNTER — Other Ambulatory Visit: Payer: Self-pay | Admitting: Cardiology

## 2019-04-21 ENCOUNTER — Telehealth: Payer: Self-pay | Admitting: Cardiology

## 2019-04-21 ENCOUNTER — Emergency Department (HOSPITAL_COMMUNITY)
Admission: EM | Admit: 2019-04-21 | Discharge: 2019-04-21 | Disposition: A | Discharge status: Home / Self Care | Payer: Medicare HMO | Attending: Emergency Medicine | Admitting: Emergency Medicine

## 2019-04-21 DIAGNOSIS — Z5321 Procedure and treatment not carried out due to patient leaving prior to being seen by health care provider: Secondary | ICD-10-CM | POA: Diagnosis not present

## 2019-04-21 DIAGNOSIS — R0602 Shortness of breath: Secondary | ICD-10-CM | POA: Diagnosis not present

## 2019-04-21 DIAGNOSIS — R079 Chest pain, unspecified: Secondary | ICD-10-CM | POA: Diagnosis not present

## 2019-04-21 LAB — TROPONIN I (HIGH SENSITIVITY)
Troponin I (High Sensitivity): 8 ng/L (ref <18)
Troponin I (High Sensitivity): 5 ng/L (ref <18)

## 2019-04-21 LAB — BASIC METABOLIC PANEL
Anion gap: 9 (ref 5–15)
BUN: 6 mg/dL — ABNORMAL LOW (ref 8–23)
CO2: 28 mmol/L (ref 22–32)
Calcium: 8.8 mg/dL — ABNORMAL LOW (ref 8.9–10.3)
Chloride: 100 mmol/L (ref 98–111)
Creatinine, Ser: 1.08 mg/dL — ABNORMAL HIGH (ref 0.44–1.00)
GFR calc Af Amer: >60 mL/min (ref >60)
GFR calc non Af Amer: 54 mL/min — ABNORMAL LOW (ref >60)
Glucose, Bld: 103 mg/dL — ABNORMAL HIGH (ref 70–99)
Potassium: 3.6 mmol/L (ref 3.5–5.1)
Sodium: 137 mmol/L (ref 135–145)

## 2019-04-21 LAB — CBC
HCT: 35.9 % — ABNORMAL LOW (ref 36.0–46.0)
Hemoglobin: 11.2 g/dL — ABNORMAL LOW (ref 12.0–15.0)
MCH: 30.7 pg (ref 26.0–34.0)
MCHC: 31.2 g/dL (ref 30.0–36.0)
MCV: 98.4 fL (ref 80.0–100.0)
Platelets: 268 10*3/uL (ref 150–400)
RBC: 3.65 MIL/uL — ABNORMAL LOW (ref 3.87–5.11)
RDW: 13.2 % (ref 11.5–15.5)
WBC: 6.8 10*3/uL (ref 4.0–10.5)
nRBC: 0 % (ref 0.0–0.2)

## 2019-04-21 MED ORDER — SODIUM CHLORIDE 0.9% FLUSH: 3.0000 mL | Freq: Once | INTRAVENOUS | Status: DC

## 2019-04-21 NOTE — Telephone Encounter (Signed)
Pt called and was SOB over phone. Stated she has had chest pain for 3 days and it was 12/10 pain this morning at 4am. She states she has had no releif and could not catch her breath on the phone. Asked pt if took Nitro, stated she has already taken 3. Advised pt to call 911.

## 2019-04-21 NOTE — Telephone Encounter (Signed)
Called patient back- she states she is having current severe chest pains, and sounds SOB on the phone with difficulty breathing. Advised patient to call EMS to take her to hospital. Patient verbalized understanding.

## 2019-04-21 NOTE — ED Notes (Signed)
Pt encouraged to stay again for the 2nd time she has come to nurses station wanting to eave. She has agreed to wait until 5pm if she's not in a room by then she is going home

## 2019-04-21 NOTE — Telephone Encounter (Signed)
New message  (dot phrase not loaded)  Patient is calling in with current severe chest pains. Chest pain has been going on for 4 days, having shortness of breath and has taken Nitroglycerin on Friday 04/18/19.

## 2019-04-21 NOTE — ED Notes (Signed)
Pt called husband to come and get her

## 2019-04-21 NOTE — ED Notes (Signed)
Pt asked where her name is on list as she has been here since 11:30. Told patient there were two patients that had waited longer and she should be back in a room soon.

## 2019-04-21 NOTE — ED Triage Notes (Signed)
Patient reports central/R-sided chest pain x 3 days, initially started as intermittent but now constant and pressure-like in nature. Endorses history of same, hx stents. Patient states she feels short of breath when pain becomes worse. Denies dizziness/lightheadedness, N/V. Resp e/u, skin w/d.

## 2019-04-25 ENCOUNTER — Ambulatory Visit (INDEPENDENT_AMBULATORY_CARE_PROVIDER_SITE_OTHER): Payer: Medicare HMO | Admitting: Internal Medicine

## 2019-04-25 ENCOUNTER — Other Ambulatory Visit: Payer: Self-pay

## 2019-04-25 ENCOUNTER — Ambulatory Visit (INDEPENDENT_AMBULATORY_CARE_PROVIDER_SITE_OTHER)
Admission: RE | Admit: 2019-04-25 | Discharge: 2019-04-25 | Disposition: A | Discharge status: Home / Self Care | Payer: Medicare HMO | Source: Ambulatory Visit | Attending: Internal Medicine | Admitting: Internal Medicine

## 2019-04-25 ENCOUNTER — Encounter: Payer: Self-pay | Admitting: Internal Medicine

## 2019-04-25 ENCOUNTER — Telehealth: Payer: Self-pay | Admitting: Internal Medicine

## 2019-04-25 VITALS — BP 120/50 | HR 97 | Temp 99.0°F | Ht 64.0 in | Wt 161.0 lb

## 2019-04-25 DIAGNOSIS — M898X1 Other specified disorders of bone, shoulder: Secondary | ICD-10-CM

## 2019-04-25 DIAGNOSIS — M25511 Pain in right shoulder: Secondary | ICD-10-CM | POA: Diagnosis not present

## 2019-04-25 DIAGNOSIS — R0781 Pleurodynia: Secondary | ICD-10-CM | POA: Diagnosis not present

## 2019-04-25 DIAGNOSIS — S299XXA Unspecified injury of thorax, initial encounter: Secondary | ICD-10-CM | POA: Diagnosis not present

## 2019-04-25 DIAGNOSIS — S4991XA Unspecified injury of right shoulder and upper arm, initial encounter: Secondary | ICD-10-CM | POA: Diagnosis not present

## 2019-04-25 NOTE — Telephone Encounter (Signed)
X-rays of clavicle, shoulder and ribs show no acute fractures.  He does have some chronic right rib fractures which are not new.  Continue current pain medication.  I can refer her to orthopedics or our sports medicine doctor if she wishes.
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
## 2019-04-25 NOTE — Telephone Encounter (Signed)
Pt informed of below. She wants to see how she does over the weekend. She will call us back if she decides to see sports med or ortho.

## 2019-04-25 NOTE — Patient Instructions (Signed)
Have x-rays today and we will call you with the results.

## 2019-04-30 NOTE — Telephone Encounter (Signed)
Went to ER - left before being seen. Has been seen in PCP clinic  [name], MD

## 2019-05-19 DIAGNOSIS — Z20828 Contact with and (suspected) exposure to other viral communicable diseases: Secondary | ICD-10-CM | POA: Diagnosis not present

## 2019-05-19 DIAGNOSIS — R519 Headache, unspecified: Secondary | ICD-10-CM | POA: Diagnosis not present

## 2019-05-20 ENCOUNTER — Ambulatory Visit: Payer: Self-pay | Admitting: *Deleted

## 2019-05-20 ENCOUNTER — Telehealth: Payer: Self-pay | Admitting: Internal Medicine

## 2019-05-20 DIAGNOSIS — J441 Chronic obstructive pulmonary disease with (acute) exacerbation: Secondary | ICD-10-CM | POA: Diagnosis not present

## 2019-05-20 DIAGNOSIS — N3000 Acute cystitis without hematuria: Secondary | ICD-10-CM | POA: Diagnosis not present

## 2019-05-20 DIAGNOSIS — Z03818 Encounter for observation for suspected exposure to other biological agents ruled out: Secondary | ICD-10-CM | POA: Diagnosis not present

## 2019-05-20 DIAGNOSIS — C3490 Malignant neoplasm of unspecified part of unspecified bronchus or lung: Secondary | ICD-10-CM | POA: Diagnosis not present

## 2019-05-20 DIAGNOSIS — R519 Headache, unspecified: Secondary | ICD-10-CM | POA: Diagnosis not present

## 2019-05-20 NOTE — Telephone Encounter (Signed)
Patient going to ED.

## 2019-05-20 NOTE — Telephone Encounter (Signed)
Copied from [place] [id number]. Topic: Referral - Request for Referral >> May 20, 2019 10:26 AM Leward Quan A wrote: Has patient seen PCP for this complaint? Yes.   *If NO, is insurance requiring patient see PCP for this issue before PCP can refer them? Referral for which specialty: CT Scan Preferred provider/office: None chosen  Reason for referral: Pain in her head feeling like electric shocks

## 2019-05-20 NOTE — Telephone Encounter (Signed)
Pt called with complaints of "shocking pains in her head"; she is also having dizziness and blurred vision; she says that she has been having headaches since last week (05/15/2019) but they become shocking over the weekend; the pt says she went to [facility] and was told to go to ED for a CT; her visual changes (blurred vision) started in her right eye on 05/19/2019; she also says that when she holds her head up, it feels like evertyhing is going in circles; she rates her pain at 9-10; she has takes hydrocodone daily prn pain;   recommendations made per nurse triage protocol; she verbalizes understanding but does not want to go; the pt sees Dr Alain Marion, [place]; pt transferred to [place] for final disposition.  Reason for Disposition . [1] SEVERE headache (e.g., excruciating) AND [2] "worst headache" of life  Answer Assessment - Initial Assessment Questions 1. LOCATION: "Where does it hurt?"      varies 2. ONSET: "When did the headache start?" (Minutes, hours or days)      05/15/2019 3. PATTERN: "Does the pain come and go, or has it been constant since it started?"   constant 4. SEVERITY: "How bad is the pain?" and "What does it keep you from doing?"  (e.g., Scale 1-10; mild, moderate, or severe)   - MILD (1-3): doesn't interfere with normal activities    - MODERATE (4-7): interferes with normal activities or awakens from sleep    - SEVERE (8-10): excruciating pain, unable to do any normal activities       9-10 out of 10 5. RECURRENT SYMPTOM: "Have you ever had headaches before?" If so, ask: "When was the last time?" and "What happened that time?"      no 6. CAUSE: "What do you think is causing the headache?"     Not sure 7. MIGRAINE: "Have you been diagnosed with migraine headaches?" If so, ask: "Is this headache similar?"      Yes, not like normal migraine 8. HEAD INJURY: "Has there been any recent injury to the head?"      no 9. OTHER SYMPTOMS: "Do you have any other symptoms?" (fever, stiff  neck, eye pain, sore throat, cold symptoms)    Dizziness (head spinning), vision blurred in right eye 10. PREGNANCY: "Is there any chance you are pregnant?" "When was your last menstrual period?"      no  Protocols used: HEADACHE-A-AH

## 2019-05-20 NOTE — Telephone Encounter (Signed)
Triage note on this issues already, Patient is going to ED.

## 2019-06-10 ENCOUNTER — Other Ambulatory Visit: Payer: Self-pay | Admitting: Cardiology

## 2019-06-10 NOTE — Telephone Encounter (Signed)
Rx has been sent to the pharmacy electronically. ° °

## 2019-06-11 DIAGNOSIS — Z0100 Encounter for examination of eyes and vision without abnormal findings: Secondary | ICD-10-CM | POA: Diagnosis not present

## 2019-06-11 DIAGNOSIS — Z961 Presence of intraocular lens: Secondary | ICD-10-CM | POA: Diagnosis not present

## 2019-06-13 ENCOUNTER — Other Ambulatory Visit: Payer: Self-pay | Admitting: Internal Medicine

## 2019-06-15 ENCOUNTER — Other Ambulatory Visit: Payer: Self-pay | Admitting: Cardiology

## 2019-06-19 DIAGNOSIS — C3490 Malignant neoplasm of unspecified part of unspecified bronchus or lung: Secondary | ICD-10-CM | POA: Diagnosis not present

## 2019-07-03 ENCOUNTER — Other Ambulatory Visit: Payer: Self-pay

## 2019-07-03 ENCOUNTER — Ambulatory Visit (INDEPENDENT_AMBULATORY_CARE_PROVIDER_SITE_OTHER): Payer: Medicare HMO | Admitting: Internal Medicine

## 2019-07-03 ENCOUNTER — Encounter: Payer: Self-pay | Admitting: Internal Medicine

## 2019-07-03 VITALS — BP 130/54 | HR 83 | Temp 98.2°F | Ht 64.0 in | Wt 162.0 lb

## 2019-07-03 DIAGNOSIS — K222 Esophageal obstruction: Secondary | ICD-10-CM | POA: Diagnosis not present

## 2019-07-03 DIAGNOSIS — F411 Generalized anxiety disorder: Secondary | ICD-10-CM

## 2019-07-03 DIAGNOSIS — I10 Essential (primary) hypertension: Secondary | ICD-10-CM

## 2019-07-03 DIAGNOSIS — E034 Atrophy of thyroid (acquired): Secondary | ICD-10-CM

## 2019-07-03 DIAGNOSIS — G4700 Insomnia, unspecified: Secondary | ICD-10-CM

## 2019-07-03 DIAGNOSIS — M5440 Lumbago with sciatica, unspecified side: Secondary | ICD-10-CM

## 2019-07-03 DIAGNOSIS — G8929 Other chronic pain: Secondary | ICD-10-CM | POA: Diagnosis not present

## 2019-07-03 DIAGNOSIS — J449 Chronic obstructive pulmonary disease, unspecified: Secondary | ICD-10-CM

## 2019-07-03 DIAGNOSIS — R69 Illness, unspecified: Secondary | ICD-10-CM | POA: Diagnosis not present

## 2019-07-03 MED ORDER — HYDROCODONE-ACETAMINOPHEN 10-325 MG PO TABS: 1.0000 | ORAL_TABLET | Freq: Four times a day (QID) | ORAL | 0 refills | Status: DC | PRN

## 2019-07-03 NOTE — Assessment & Plan Note (Signed)
Chronic Norco, (Fentanyl - d/c 7/17)  Potential benefits of a long term opioids use as well as potential risks (i.e. addiction risk, apnea etc) and complications (i.e. Somnolence, constipation and others) were explained to the patient and were aknowledged. Narcane nasal prn Do not take w/Xanax

## 2019-07-03 NOTE — Assessment & Plan Note (Signed)
Furiosemide

## 2019-07-03 NOTE — Assessment & Plan Note (Signed)
Trazodone  °

## 2019-07-03 NOTE — Progress Notes (Signed)
Subjective:  Patient ID: [name], female    DOB: 1956/04/18  Age: 64 y.o. MRN: [id number]  CC: No chief complaint on file.   HPI Korayma L Barbuto presents for LBP, anxiety, recurrent UTIs, dysuria C/o trouble swallowing   Outpatient Medications Prior to Visit  Medication Sig Dispense Refill  . acetaminophen (TYLENOL) 325 MG tablet Take 2 tablets (650 mg total) by mouth every 4 (four) hours as needed for headache or mild pain.    [name] albuterol (PROVENTIL HFA;VENTOLIN HFA) 108 (90 Base) MCG/ACT inhaler Inhale 2 puffs into the lungs every 6 (six) hours as needed for wheezing or shortness of breath. 1 Inhaler 5  . albuterol (PROVENTIL) (2.5 MG/3ML) 0.083% nebulizer solution Take 3 mLs (2.5 mg total) by nebulization every 4 (four) hours as needed for wheezing or shortness of breath. 150 mL 5  . ALPRAZolam (XANAX) 0.25 MG tablet TAKE 1 TABLET (0.25 MG TOTAL) BY MOUTH AT BEDTIME AS NEEDED FOR ANXIETY. 90 tablet 1  . bisacodyl (DULCOLAX) 5 MG EC tablet Take 2 tablets (10 mg total) by mouth at bedtime. 30 tablet   . budesonide-formoterol (SYMBICORT) 160-4.5 MCG/ACT inhaler Inhale 2 puffs into the lungs 2 (two) times daily at 10 AM and 5 PM. Rinse mouth after each use 1 Inhaler 0  . clopidogrel (PLAVIX) 75 MG tablet TAKE 1 TABLET (75 MG TOTAL) BY MOUTH DAILY WITH BREAKFAST. 90 tablet 3  . diltiazem (CARDIZEM CD) 120 MG 24 hr capsule TAKE 1 CAPSULE BY MOUTH AT BEDTIME. 90 capsule 2  . diltiazem (CARDIZEM SR) 90 MG 12 hr capsule Please specify directions, refills and quantity 30 capsule 3  . furosemide (LASIX) 20 MG tablet Take 1 tablet by mouth once daily 90 tablet 2  . guaiFENesin (MUCINEX) 600 MG 12 hr tablet Take 1 tablet (600 mg total) by mouth 2 (two) times daily as needed for cough or to loosen phlegm. 14 tablet 0  . HYDROcodone-acetaminophen (NORCO) 10-325 MG tablet Take 1 tablet by mouth every 6 (six) hours as needed for severe pain. Please fill on or after 06/18/19 120 tablet 0  .  isosorbide mononitrate (IMDUR) 30 MG 24 hr tablet Take 1 tablet by mouth once daily 90 tablet 0  . ketoconazole (NIZORAL) 2 % cream Apply 1 application topically daily. 45 g 1  . levothyroxine (SYNTHROID, LEVOTHROID) 150 MCG tablet Take 1 tablet (150 mcg total) by mouth daily. 90 tablet 3  . nitroGLYCERIN (NITROSTAT) 0.4 MG SL tablet DISSOLVE ONE TABLET UNDER THE TONGUE EVERY 5 MINUTES AS NEEDED FOR CHEST PAIN.  DO NOT EXCEED A TOTAL OF 3 DOSES IN 15 MINUTES 25 tablet 0  . OXYGEN Inhale 2.5 L/min into the lungs at bedtime.    . pantoprazole (PROTONIX) 40 MG tablet Take 1 tablet (40 mg total) by mouth daily before breakfast. 90 tablet 3  . PARoxetine (PAXIL) 40 MG tablet Take 1 tablet (40 mg total) by mouth daily. 90 tablet 3  . phenytoin (DILANTIN) 100 MG ER capsule TAKE 100 MG EVERY MORNING AND 200 MG EVERY NIGHT. 270 capsule 3  . polyethylene glycol (MIRALAX / GLYCOLAX) 17 g packet Take 17 g by mouth daily.    . promethazine (PHENERGAN) 25 MG tablet Take 1 tablet (25 mg total) by mouth every 8 (eight) hours as needed for nausea or vomiting. 90 tablet 1  . Respiratory Therapy Supplies (FLUTTER) DEVI As directed 1 each 0  . senna-docusate (SENOKOT S) 8.6-50 MG tablet Take 2 tablets by mouth  at bedtime.    . SUMAtriptan (IMITREX) 100 MG tablet TAKE 1 TABLET BY MOUTH EVERY 2 HOURS AS NEEDED FOR MIAGRAINE/HEADACHE 9 tablet 0  . traZODone (DESYREL) 100 MG tablet TAKE 1 TABLET BY MOUTH EVERYDAY AT BEDTIME 90 tablet 3  . Vitamin D, Ergocalciferol, (DRISDOL) 1.25 MG (50000 UT) CAPS capsule TAKE ONE CAPSULE BY MOUTH ONE TIME PER WEEK 12 capsule 3  . HYDROcodone-acetaminophen (NORCO) 10-325 MG tablet Take 1 tablet by mouth every 6 (six) hours as needed for up to 5 days for severe pain. 120 tablet 0  . HYDROcodone-acetaminophen (NORCO) 10-325 MG tablet Take 1 tablet by mouth every 6 (six) hours as needed for up to 5 days for severe pain. 120 tablet 0  . rosuvastatin (CRESTOR) 40 MG tablet Take 1 tablet (40 mg  total) by mouth daily. Discontinue 20 mg dose 90 tablet 1   No facility-administered medications prior to visit.    ROS: Review of Systems  Constitutional: Positive for fatigue. Negative for activity change, appetite change, chills and unexpected weight change.  HENT: Negative for congestion, mouth sores and sinus pressure.   Eyes: Negative for visual disturbance.  Respiratory: Negative for cough, chest tightness, shortness of breath and wheezing.   Cardiovascular: Negative for chest pain.  Gastrointestinal: Negative for abdominal pain and nausea.  Genitourinary: Negative for difficulty urinating, frequency and vaginal pain.  Musculoskeletal: Positive for arthralgias, back pain, gait problem, myalgias, neck pain and neck stiffness.  Skin: Negative for pallor and rash.  Neurological: Positive for dizziness, tremors, weakness, light-headedness, numbness and headaches. Negative for speech difficulty.  Hematological: Does not bruise/bleed easily.  Psychiatric/Behavioral: Positive for decreased concentration, dysphoric mood and sleep disturbance. Negative for confusion and suicidal ideas. The patient is nervous/anxious.     Objective:  BP (!) 130/54 (BP Location: Left Arm, Patient Position: Sitting, Cuff Size: Normal)   Pulse 83   Temp 98.2 F (36.8 C) (Oral)   Ht 5\' 4"  (1.626 m)   Wt 162 lb (73.5 kg)   SpO2 95%   BMI 27.81 kg/m   BP Readings from Last 3 Encounters:  07/03/19 (!) 130/54  04/25/19 (!) 120/50  04/21/19 131/68    Wt Readings from Last 3 Encounters:  07/03/19 162 lb (73.5 kg)  04/25/19 161 lb (73 kg)  04/03/19 161 lb (73 kg)    Physical Exam Constitutional:      General: She is not in acute distress.    Appearance: She is well-developed. She is obese. She is not diaphoretic.  HENT:     Head: Normocephalic.     Right Ear: External ear normal.     Left Ear: External ear normal.     Nose: Nose normal.  Eyes:     General:        Right eye: No discharge.          Left eye: No discharge.     Conjunctiva/sclera: Conjunctivae normal.     Pupils: Pupils are equal, round, and reactive to light.  Neck:     Thyroid: No thyromegaly.     Vascular: No JVD.     Trachea: No tracheal deviation.  Cardiovascular:     Rate and Rhythm: Normal rate and regular rhythm.     Heart sounds: Normal heart sounds.  Pulmonary:     Effort: No respiratory distress.     Breath sounds: No stridor. No wheezing.  Abdominal:     General: Bowel sounds are normal. There is no distension.  Palpations: Abdomen is soft. There is no mass.     Tenderness: There is no abdominal tenderness. There is no guarding or rebound.  Musculoskeletal:        General: Tenderness present.     Cervical back: Normal range of motion and neck supple.  Lymphadenopathy:     Cervical: No cervical adenopathy.  Skin:    Findings: No bruising, erythema or rash.  Neurological:     Mental Status: Mental status is at baseline.     Cranial Nerves: No cranial nerve deficit.     Motor: Weakness present. No abnormal muscle tone.     Coordination: Coordination abnormal.     Gait: Gait abnormal.     Deep Tendon Reflexes: Reflexes normal.  Psychiatric:        Behavior: Behavior normal.        Thought Content: Thought content normal.        Judgment: Judgment normal.   in a w/c Painful neck, LS, hips  Lab Results  Component Value Date   WBC 6.8 04/21/2019   HGB 11.2 (L) 04/21/2019   HCT 35.9 (L) 04/21/2019   PLT 268 04/21/2019   GLUCOSE 103 (H) 04/21/2019   CHOL 128 06/29/2018   TRIG 184 (H) 06/29/2018   HDL 35 (L) 06/29/2018   LDLDIRECT 187.0 07/14/2015   LDLCALC 56 06/29/2018   ALT 13 01/01/2019   AST 13 01/01/2019   NA 137 04/21/2019   K 3.6 04/21/2019   CL 100 04/21/2019   CREATININE 1.08 (H) 04/21/2019   BUN 6 (L) 04/21/2019   CO2 28 04/21/2019   TSH 1.84 01/01/2019   INR 1.0 09/16/2008   HGBA1C  09/17/2008    5.0 (NOTE)   The ADA recommends the following therapeutic goal for  glycemic   control related to Hgb A1C measurement:   Goal of Therapy:   < 7.0% Hgb A1C   Reference: American Diabetes Association: Clinical Practice   Recommendations 2008, Diabetes Care,  2008, 31:(Suppl 1).    DG Ribs Unilateral Right  Result Date: 04/25/2019 CLINICAL DATA:  Pain after fall 1 week ago EXAM: RIGHT RIBS - 2 VIEW COMPARISON:  Chest x-ray 04/21/2019, 06/28/2018 FINDINGS: No acute displaced rib fracture is seen. There are chronic right fifth through eighth rib deformities. Negative for right pleural effusion or pneumothorax. IMPRESSION: 1. No definite acute displaced right rib fracture 2. Chronic right fifth through eighth rib fractures Electronically Signed   By: Donavan Foil M.D.   On: 04/25/2019 15:40   DG Clavicle Right  Result Date: 04/25/2019 CLINICAL DATA:  Status post fall 7 days ago.  Generalized pain. EXAM: RIGHT CLAVICLE - 2+ VIEWS COMPARISON:  None. FINDINGS: There is no evidence of fracture or other focal bone lesions. Soft tissues are unremarkable. IMPRESSION: No acute osseous injury of the right clavicle. Electronically Signed   By: Kathreen Devoid   On: 04/25/2019 15:38   DG Shoulder Right  Result Date: 04/25/2019 CLINICAL DATA:  Fall with shoulder pain EXAM: RIGHT SHOULDER - 2+ VIEW COMPARISON:  None. FINDINGS: No fracture or malalignment.  The [facility] joint appears intact IMPRESSION: No acute osseous abnormality Electronically Signed   By: Donavan Foil M.D.   On: 04/25/2019 15:41    Assessment & Plan:   There are no diagnoses linked to this encounter.   No orders of the defined types were placed in this encounter.    Follow-up: No follow-ups on file.  Walker Kehr, MD

## 2019-07-03 NOTE — Assessment & Plan Note (Signed)
Worse. Ref to Dr Carlean Purl Cont Protonix

## 2019-07-03 NOTE — Assessment & Plan Note (Signed)
Breathing is ok Cont w/Albuterol

## 2019-07-03 NOTE — Assessment & Plan Note (Signed)
On Levothroid 

## 2019-07-03 NOTE — Assessment & Plan Note (Signed)
Trazodone at hs Xanax prn  Potential benefits of a long term benzodiazepines  use as well as potential risks  and complications were explained to the patient and were aknowledged.

## 2019-07-09 ENCOUNTER — Ambulatory Visit (HOSPITAL_COMMUNITY): Payer: Medicare HMO | Attending: Cardiology

## 2019-07-09 ENCOUNTER — Other Ambulatory Visit: Payer: Self-pay

## 2019-07-09 DIAGNOSIS — I25119 Atherosclerotic heart disease of native coronary artery with unspecified angina pectoris: Secondary | ICD-10-CM | POA: Diagnosis not present

## 2019-07-09 DIAGNOSIS — J449 Chronic obstructive pulmonary disease, unspecified: Secondary | ICD-10-CM | POA: Diagnosis not present

## 2019-07-09 DIAGNOSIS — I351 Nonrheumatic aortic (valve) insufficiency: Secondary | ICD-10-CM | POA: Insufficient documentation

## 2019-07-20 DIAGNOSIS — C3490 Malignant neoplasm of unspecified part of unspecified bronchus or lung: Secondary | ICD-10-CM | POA: Diagnosis not present

## 2019-07-21 ENCOUNTER — Other Ambulatory Visit: Payer: Self-pay | Admitting: Cardiology

## 2019-08-05 ENCOUNTER — Encounter: Payer: Self-pay | Admitting: Internal Medicine

## 2019-08-05 ENCOUNTER — Ambulatory Visit: Payer: Medicare HMO | Admitting: Internal Medicine

## 2019-08-05 ENCOUNTER — Other Ambulatory Visit: Payer: Self-pay

## 2019-08-05 VITALS — BP 108/50 | HR 88 | Temp 98.1°F | Ht 65.0 in | Wt 164.0 lb

## 2019-08-05 DIAGNOSIS — K219 Gastro-esophageal reflux disease without esophagitis: Secondary | ICD-10-CM

## 2019-08-05 DIAGNOSIS — R1319 Other dysphagia: Secondary | ICD-10-CM

## 2019-08-05 DIAGNOSIS — I252 Old myocardial infarction: Secondary | ICD-10-CM | POA: Diagnosis not present

## 2019-08-05 DIAGNOSIS — I351 Nonrheumatic aortic (valve) insufficiency: Secondary | ICD-10-CM

## 2019-08-05 DIAGNOSIS — J449 Chronic obstructive pulmonary disease, unspecified: Secondary | ICD-10-CM | POA: Diagnosis not present

## 2019-08-05 DIAGNOSIS — K222 Esophageal obstruction: Secondary | ICD-10-CM

## 2019-08-05 DIAGNOSIS — Z7902 Long term (current) use of antithrombotics/antiplatelets: Secondary | ICD-10-CM

## 2019-08-05 DIAGNOSIS — K5904 Chronic idiopathic constipation: Secondary | ICD-10-CM | POA: Diagnosis not present

## 2019-08-05 DIAGNOSIS — R1310 Dysphagia, unspecified: Secondary | ICD-10-CM | POA: Diagnosis not present

## 2019-08-05 NOTE — Progress Notes (Signed)
Kaylee Hansen 64 y.o. [birth year] [id number]  Assessment & Plan:   Encounter Diagnoses  Name Primary?  . Esophageal dysphagia Yes  . GERD with stricture   . History of non-ST elevation myocardial infarction (NSTEMI)   . Long term current use of antithrombotics/antiplatelets - clopidogrel   . Severe Stage C1 aortic regurgitation by prior echocardiography   . Chronic obstructive pulmonary disease, unspecified COPD type ([place])   . Chronic idiopathic constipation     Dysphagia 3 diet Await cards evaluation ? Pulmonary evaluation also  She is at high risk for sedation complications due to lung and heart disease She might need an EGD with possible dilation if she does would likely benefit from pre-procedure anesthesia eval Would also need to hold clopidogrel x 5 days  Ba swallow with tablet to get more information  Suggest use enema prn over extra MiraLAx as latter does not seem to work too well  IW:[id number], Kaylee Lacks, MD Kaylee Hew, MD Subjective:   Chief Complaint: dysphagia  HPI Kaylee Hansen is here with husband due to development of intermitttent solid food dysphagia. She has a hx of lower esophageal ring and dilation last in 2014 (52 [address])  [employer] Swiss roll are examples of food she has had difficulty with. About 1-2 x a week.  Is on PPI - pantoprazole 40 mg qAM No sig heartburn  Wt Readings from Last 3 Encounters:  08/05/19 164 lb (74.4 kg)  07/03/19 162 lb (73.5 kg)  04/25/19 161 lb (73 kg)    Chronic constipation issues are unchanged treating with extra MiraLAx and sometimes an enema  On O2 and can stand but cannot walk.  Has severe AI with Aortic Stenosis and CADs/p PCI  followed by Dr. Ellyn Hack COPD also Not an operative candidate for valve repair Allergies  Allergen Reactions  . Morphine     "Makes me go crazy."   . Nitrofuran Derivatives     ??confusion  . Oxycodone Hcl Other (See Comments)    Knocked her out for 6 days  . Penicillins  Hives    Has patient had a PCN reaction causing immediate rash, facial/tongue/throat swelling, SOB or lightheadedness with hypotension: unknown Has patient had a PCN reaction causing severe rash involving mucus membranes or skin necrosis: unknown Has patient had a PCN reaction that required hospitalization No Has patient had a PCN reaction occurring within the last 10 years: No If all of the above answers are "NO", then may proceed with Cephalosporin use.   . Tape Other (See Comments)    Skin turns red and burns   Current Meds  Medication Sig  . acetaminophen (TYLENOL) 325 MG tablet Take 2 tablets (650 mg total) by mouth every 4 (four) hours as needed for headache or mild pain.  [name] albuterol (PROVENTIL HFA;VENTOLIN HFA) 108 (90 Base) MCG/ACT inhaler Inhale 2 puffs into the lungs every 6 (six) hours as needed for wheezing or shortness of breath.  [name] albuterol (PROVENTIL) (2.5 MG/3ML) 0.083% nebulizer solution Take 3 mLs (2.5 mg total) by nebulization every 4 (four) hours as needed for wheezing or shortness of breath.  . ALPRAZolam (XANAX) 0.25 MG tablet TAKE 1 TABLET (0.25 MG TOTAL) BY MOUTH AT BEDTIME AS NEEDED FOR ANXIETY.  . bisacodyl (DULCOLAX) 5 MG EC tablet Take 2 tablets (10 mg total) by mouth at bedtime.  . budesonide-formoterol (SYMBICORT) 160-4.5 MCG/ACT inhaler Inhale 2 puffs into the lungs 2 (two) times daily at 10 AM and 5 PM. Rinse mouth after  each use  . clopidogrel (PLAVIX) 75 MG tablet TAKE 1 TABLET (75 MG TOTAL) BY MOUTH DAILY WITH BREAKFAST.  [name] diltiazem (CARDIZEM CD) 120 MG 24 hr capsule TAKE 1 CAPSULE BY MOUTH AT BEDTIME.  . furosemide (LASIX) 20 MG tablet Take 1 tablet by mouth once daily  . guaiFENesin (MUCINEX) 600 MG 12 hr tablet Take 1 tablet (600 mg total) by mouth 2 (two) times daily as needed for cough or to loosen phlegm.  [name] HYDROcodone-acetaminophen (NORCO) 10-325 MG tablet Take 1 tablet by mouth every 6 (six) hours as needed for severe pain. Please fill on or after  07/18/19  . isosorbide mononitrate (IMDUR) 30 MG 24 hr tablet Take 1 tablet by mouth once daily  . ketoconazole (NIZORAL) 2 % cream Apply 1 application topically daily.  [name] levothyroxine (SYNTHROID, LEVOTHROID) 150 MCG tablet Take 1 tablet (150 mcg total) by mouth daily.  . nitroGLYCERIN (NITROSTAT) 0.4 MG SL tablet DISSOLVE ONE TABLET UNDER THE TONGUE EVERY 5 MINUTES AS NEEDED FOR CHEST PAIN.  DO NOT EXCEED A TOTAL OF 3 DOSES IN 15 MINUTES  . OXYGEN Inhale 2.5 L/min into the lungs at bedtime.  . pantoprazole (PROTONIX) 40 MG tablet Take 1 tablet (40 mg total) by mouth daily before breakfast.  . PARoxetine (PAXIL) 40 MG tablet Take 1 tablet (40 mg total) by mouth daily.  . phenytoin (DILANTIN) 100 MG ER capsule TAKE 100 MG EVERY MORNING AND 200 MG EVERY NIGHT.  [name] polyethylene glycol (MIRALAX / GLYCOLAX) 17 g packet Take 17 g by mouth daily.  . promethazine (PHENERGAN) 25 MG tablet Take 1 tablet (25 mg total) by mouth every 8 (eight) hours as needed for nausea or vomiting.  [name] Respiratory Therapy Supplies (FLUTTER) DEVI As directed  . rosuvastatin (CRESTOR) 40 MG tablet Take 1 tablet (40 mg total) by mouth daily. Discontinue 20 mg dose  . senna-docusate (SENOKOT S) 8.6-50 MG tablet Take 2 tablets by mouth at bedtime.  . SUMAtriptan (IMITREX) 100 MG tablet TAKE 1 TABLET BY MOUTH EVERY 2 HOURS AS NEEDED FOR MIAGRAINE/HEADACHE  . traZODone (DESYREL) 100 MG tablet TAKE 1 TABLET BY MOUTH EVERYDAY AT BEDTIME  . Vitamin D, Ergocalciferol, (DRISDOL) 1.25 MG (50000 UT) CAPS capsule TAKE ONE CAPSULE BY MOUTH ONE TIME PER WEEK   Past Medical History:  Diagnosis Date  . Acute respiratory failure following trauma and surgery ([place]) 08/26/2012  . Anxiety   . Aortic insufficiency with aortic stenosis 04/2017   TTE December 2019: Mild AS with severe regurgitation.  Mild LA dilation.;;  TEE January 2016: Severe-type III aortic regurgitation (holodiastolic flow reversal in the a sending aorta & vena contracta =6.   .  CAD S/P percutaneous coronary angioplasty 11/2017   Proximal RCA PCI Synergy DES 3.5 mm x 16 mm (3.8 mm). Ost-mLM 30%. Ost-prox Cx 40%.  . Collagenous colitis   . Colon adenomas 2011  . Constipation    Chronic abdominal pain and constipation  . COPD (chronic obstructive pulmonary disease) ([place])   . Depression   . Diverticulosis   . Esophageal stricture 11/08/2012   Ulcer noted in 2010  . GERD (gastroesophageal reflux disease)   . Helicobacter pylori gastritis 2010   Pylera Tx  . History of cholecystectomy   . Hx of appendectomy   . Hx of cancer of lung 1999  . Hx of hysterectomy   . Hyperlipidemia   . Hypothyroidism   . Low back pain   . Non-STEMI (non-ST elevated myocardial infarction) ([place]) 11/2017  RCA PCI  . Osteoarthritis of knee    bilateral knee  . Seizures ([place])   . Stroke [facility])    CVA, hx of 97  . 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 ABDOMINAL HYSTERECTOMY     complete 1992  . ABDOMINAL SURGERY     Exploratory  . APPENDECTOMY    . BALLOON DILATION N/A 11/08/2012   Procedure: BALLOON DILATION;  Surgeon: Gatha Mayer, MD;  Location: [facility] ENDOSCOPY;  Service: Endoscopy;  Laterality: N/A;  . CHOLECYSTECTOMY    . COLONOSCOPY W/ BIOPSIES     multiple  . CORONARY STENT INTERVENTION N/A 06/28/2018   Procedure: CORONARY STENT INTERVENTION;  Surgeon: Martinique, Peter M, MD;  Location: [place] CV LAB;  Service: Cardiovascular;  Laterality: N/A;  90% prox RCA -> PCI with Synergy DES 3.5 mm x 16 mm (3.8 mm).  . CT CTA CORONARY W/CA SCORE W/CM &/OR WO/CM  05/2017   Coronary calcium score 2.9. Intermediate risk. Unusual noncalcified plaque in RCA --FFR negative  . ESOPHAGOGASTRODUODENOSCOPY     w/baloon x 2  . ESOPHAGOGASTRODUODENOSCOPY N/A 11/08/2012   Procedure: ESOPHAGOGASTRODUODENOSCOPY (EGD);  Surgeon: Gatha Mayer, MD;  Location: [facility]OSCOPY;  Service: Endoscopy;  Laterality: N/A;  . LEFT HEART CATH AND CORONARY ANGIOGRAPHY  N/A 06/28/2018   Procedure: LEFT HEART CATH AND CORONARY ANGIOGRAPHY;  Surgeon: Martinique, Peter M, MD;  Location: [place] CV LAB;  Service: Cardiovascular:  90% prox RCA -> DES PCI. Ost-mLM 30%. Ost-prox Cx 40%.   EF 55-65%. LVEDP 15 mmHg.   [name] RIGHT HEART CATH N/A 07/11/2018   Procedure: RIGHT HEART CATH;  Surgeon: Leonie Man, MD;  Location: [place] CV LAB;;   Relatively normal Right Heart Cath pressures: PA pressure 26/14 mmHg-mean 20 mmHg; PCWP 13 mmHg.  RAP 6 mmHg, RVP 28/3 mmHg-EDP 10 mmHg. Severely reduced cardiac output and index by Fick: 3.63 and 2.07.  . TEE WITHOUT CARDIOVERSION N/A 07/11/2018   Procedure: TRANSESOPHAGEAL ECHOCARDIOGRAM (TEE);  Surgeon: [name], MD;  Location: [facility] ENDOSCOPY;  Service: Cardiology;;  Severe aortic regurgitation-type III. (suggested by holodiastolic flow reversal and descending aorta-vena contracta 6 mm).  . TOTAL HIP ARTHROPLASTY     Left  . TRANSTHORACIC ECHOCARDIOGRAM  05/2018   EF 60-65%.  No R WMA.  GR 1 DD.  Mild aortic stenosis with severe regurgitation.  Mild MR..  Only mild LV dilation noted.  . TRANSTHORACIC ECHOCARDIOGRAM  12/2018    EF 60-65%. Mild LVH. Gr1 DD. Mod AoV thickening. Mod-Severe AI, ~ mlid AS.   Social History   Social History Narrative   Married   2 children   No regular exercise         family history includes Coronary artery disease in her mother and another family member; Diabetes in her mother and son; Hypertension in her father; Kidney cancer in her sister; Kidney disease in an other family member.   Review of Systems As above  Objective:   Physical Exam BP (!) 108/50 (BP Location: Left Arm, Patient Position: Sitting, Cuff Size: Normal)   Pulse 88   Temp 98.1 F (36.7 C)   Ht 5\' 5"  (1.651 m)   Wt 164 lb (74.4 kg)   BMI 27.29 kg/m  Chronically ill in wheelchair Neck supple no mass Lungs clear but decreased Ht s1 s2 - I cannot hear murmur abd soft Alert and oriented x 3 approp  mood/affect

## 2019-08-05 NOTE — Patient Instructions (Addendum)
You have been scheduled for a Barium Esophogram at [place] at 9:00AM on 08/06/2019 Please arrive 20 minutes prior to your appointment for registration. Make certain not to have anything to eat or drink 3 hours prior to your test. If you need to reschedule for any reason, please contact radiology at [phone] to do so. __________________________________________________________________ A barium swallow is an examination that concentrates on views of the esophagus. This tends to be a double contrast exam (barium and two liquids which, when combined, create a gas to distend the wall of the oesophagus) or single contrast (non-ionic iodine based). The study is usually tailored to your symptoms so a good history is essential. Attention is paid during the study to the form, structure and configuration of the esophagus, looking for functional disorders (such as aspiration, dysphagia, achalasia, motility and reflux) EXAMINATION You may be asked to change into a gown, depending on the type of swallow being performed. A radiologist and radiographer will perform the procedure. The radiologist will advise you of the type of contrast selected for your procedure and direct you during the exam. You will be asked to stand, sit or lie in several different positions and to hold a small amount of fluid in your mouth before being asked to swallow while the imaging is performed .In some instances you may be asked to swallow barium coated marshmallows to assess the motility of a solid food bolus. The exam can be recorded as a digital or video fluoroscopy procedure. POST PROCEDURE It will take 1-2 days for the barium to pass through your system. To facilitate this, it is important, unless otherwise directed, to increase your fluids for the next 24-48hrs and to resume your normal diet.  This test typically takes about 30 minutes to  perform. __________________________________________________________________________________   We have given you a dysphagia diet to read and follow level #3.   I appreciate the opportunity to care for you. Silvano Rusk, MD, [facility]

## 2019-08-05 NOTE — H&P (View-Only) (Signed)
Kaylee Hansen 64 y.o. 06-29-1955 [id number]  Assessment & Plan:   Encounter Diagnoses  Name Primary?  . Esophageal dysphagia Yes  . GERD with stricture   . History of non-ST elevation myocardial infarction (NSTEMI)   . Long term current use of antithrombotics/antiplatelets - clopidogrel   . Severe Stage C1 aortic regurgitation by prior echocardiography   . Chronic obstructive pulmonary disease, unspecified COPD type (Kaylee Hansen)   . Chronic idiopathic constipation     Dysphagia 3 diet Await cards evaluation ? Pulmonary evaluation also  She is at high risk for sedation complications due to lung and heart disease She might need an EGD with possible dilation if she does would likely benefit from pre-procedure anesthesia eval Would also need to hold clopidogrel x 5 days  Ba swallow with tablet to get more information  Suggest use enema prn over extra MiraLAx as latter does not seem to work too well  DE:YCXKGYJEH, Kaylee Lacks, MD Kaylee Hew, MD Subjective:   Chief Complaint: dysphagia  HPI Kaylee Hansen is here with husband due to development of intermitttent solid food dysphagia. She has a hx of lower esophageal ring and dilation last in 2014 (52 [address])  [employer] Swiss roll are examples of food she has had difficulty with. About 1-2 x a week.  Is on PPI - pantoprazole 40 mg qAM No sig heartburn  Wt Readings from Last 3 Encounters:  08/05/19 164 lb (74.4 kg)  07/03/19 162 lb (73.5 kg)  04/25/19 161 lb (73 kg)    Chronic constipation issues are unchanged treating with extra MiraLAx and sometimes an enema  On O2 and can stand but cannot walk.  Has severe AI with Aortic Stenosis and CADs/p PCI  followed by Dr. Ellyn Hack COPD also Not an operative candidate for valve repair Allergies  Allergen Reactions  . Morphine     "Makes me go crazy."   . Nitrofuran Derivatives     ??confusion  . Oxycodone Hcl Other (See Comments)    Knocked her out for 6 days  . Penicillins  Hives    Has patient had a PCN reaction causing immediate rash, facial/tongue/throat swelling, SOB or lightheadedness with hypotension: unknown Has patient had a PCN reaction causing severe rash involving mucus membranes or skin necrosis: unknown Has patient had a PCN reaction that required hospitalization No Has patient had a PCN reaction occurring within the last 10 years: No If all of the above answers are "NO", then may proceed with Cephalosporin use.   . Tape Other (See Comments)    Skin turns red and burns   Current Meds  Medication Sig  . acetaminophen (TYLENOL) 325 MG tablet Take 2 tablets (650 mg total) by mouth every 4 (four) hours as needed for headache or mild pain.  [name] albuterol (PROVENTIL HFA;VENTOLIN HFA) 108 (90 Base) MCG/ACT inhaler Inhale 2 puffs into the lungs every 6 (six) hours as needed for wheezing or shortness of breath.  [name] albuterol (PROVENTIL) (2.5 MG/3ML) 0.083% nebulizer solution Take 3 mLs (2.5 mg total) by nebulization every 4 (four) hours as needed for wheezing or shortness of breath.  . ALPRAZolam (XANAX) 0.25 MG tablet TAKE 1 TABLET (0.25 MG TOTAL) BY MOUTH AT BEDTIME AS NEEDED FOR ANXIETY.  . bisacodyl (DULCOLAX) 5 MG EC tablet Take 2 tablets (10 mg total) by mouth at bedtime.  . budesonide-formoterol (SYMBICORT) 160-4.5 MCG/ACT inhaler Inhale 2 puffs into the lungs 2 (two) times daily at 10 AM and 5 PM. Rinse mouth after  each use  . clopidogrel (PLAVIX) 75 MG tablet TAKE 1 TABLET (75 MG TOTAL) BY MOUTH DAILY WITH BREAKFAST.  [name] diltiazem (CARDIZEM CD) 120 MG 24 hr capsule TAKE 1 CAPSULE BY MOUTH AT BEDTIME.  . furosemide (LASIX) 20 MG tablet Take 1 tablet by mouth once daily  . guaiFENesin (MUCINEX) 600 MG 12 hr tablet Take 1 tablet (600 mg total) by mouth 2 (two) times daily as needed for cough or to loosen phlegm.  [name] HYDROcodone-acetaminophen (NORCO) 10-325 MG tablet Take 1 tablet by mouth every 6 (six) hours as needed for severe pain. Please fill on or after  07/18/19  . isosorbide mononitrate (IMDUR) 30 MG 24 hr tablet Take 1 tablet by mouth once daily  . ketoconazole (NIZORAL) 2 % cream Apply 1 application topically daily.  [name] levothyroxine (SYNTHROID, LEVOTHROID) 150 MCG tablet Take 1 tablet (150 mcg total) by mouth daily.  . nitroGLYCERIN (NITROSTAT) 0.4 MG SL tablet DISSOLVE ONE TABLET UNDER THE TONGUE EVERY 5 MINUTES AS NEEDED FOR CHEST PAIN.  DO NOT EXCEED A TOTAL OF 3 DOSES IN 15 MINUTES  . OXYGEN Inhale 2.5 L/min into the lungs at bedtime.  . pantoprazole (PROTONIX) 40 MG tablet Take 1 tablet (40 mg total) by mouth daily before breakfast.  . PARoxetine (PAXIL) 40 MG tablet Take 1 tablet (40 mg total) by mouth daily.  . phenytoin (DILANTIN) 100 MG ER capsule TAKE 100 MG EVERY MORNING AND 200 MG EVERY NIGHT.  [name] polyethylene glycol (MIRALAX / GLYCOLAX) 17 g packet Take 17 g by mouth daily.  . promethazine (PHENERGAN) 25 MG tablet Take 1 tablet (25 mg total) by mouth every 8 (eight) hours as needed for nausea or vomiting.  [name] Respiratory Therapy Supplies (FLUTTER) DEVI As directed  . rosuvastatin (CRESTOR) 40 MG tablet Take 1 tablet (40 mg total) by mouth daily. Discontinue 20 mg dose  . senna-docusate (SENOKOT S) 8.6-50 MG tablet Take 2 tablets by mouth at bedtime.  . SUMAtriptan (IMITREX) 100 MG tablet TAKE 1 TABLET BY MOUTH EVERY 2 HOURS AS NEEDED FOR MIAGRAINE/HEADACHE  . traZODone (DESYREL) 100 MG tablet TAKE 1 TABLET BY MOUTH EVERYDAY AT BEDTIME  . Vitamin D, Ergocalciferol, (DRISDOL) 1.25 MG (50000 UT) CAPS capsule TAKE ONE CAPSULE BY MOUTH ONE TIME PER WEEK   Past Medical History:  Diagnosis Date  . Acute respiratory failure following trauma and surgery ([place]) 08/26/2012  . Anxiety   . Aortic insufficiency with aortic stenosis 04/2017   TTE December 2019: Mild AS with severe regurgitation.  Mild LA dilation.;;  TEE January 2016: Severe-type III aortic regurgitation (holodiastolic flow reversal in the a sending aorta & vena contracta =6.   .  CAD S/P percutaneous coronary angioplasty 11/2017   Proximal RCA PCI Synergy DES 3.5 mm x 16 mm (3.8 mm). Ost-mLM 30%. Ost-prox Cx 40%.  . Collagenous colitis   . Colon adenomas 2011  . Constipation    Chronic abdominal pain and constipation  . COPD (chronic obstructive pulmonary disease) ([place])   . Depression   . Diverticulosis   . Esophageal stricture 11/08/2012   Ulcer noted in 2010  . GERD (gastroesophageal reflux disease)   . Helicobacter pylori gastritis 2010   Pylera Tx  . History of cholecystectomy   . Hx of appendectomy   . Hx of cancer of lung 1999  . Hx of hysterectomy   . Hyperlipidemia   . Hypothyroidism   . Low back pain   . Non-STEMI (non-ST elevated myocardial infarction) ([place]) 11/2017  RCA PCI  . Osteoarthritis of knee    bilateral knee  . Seizures ([place])   . Stroke [facility]    CVA, hx of 97  . 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 ABDOMINAL HYSTERECTOMY     complete 1992  . ABDOMINAL SURGERY     Exploratory  . APPENDECTOMY    . BALLOON DILATION N/A 11/08/2012   Procedure: BALLOON DILATION;  Surgeon: Gatha Mayer, MD;  Location: [facility] ENDOSCOPY;  Service: Endoscopy;  Laterality: N/A;  . CHOLECYSTECTOMY    . COLONOSCOPY W/ BIOPSIES     multiple  . CORONARY STENT INTERVENTION N/A 06/28/2018   Procedure: CORONARY STENT INTERVENTION;  Surgeon: Martinique, Peter M, MD;  Location: [facility];  Service: Cardiovascular;  Laterality: N/A;  90% prox RCA -> PCI with Synergy DES 3.5 mm x 16 mm (3.8 mm).  . CT CTA CORONARY W/CA SCORE W/CM &/OR WO/CM  05/2017   Coronary calcium score 2.9. Intermediate risk. Unusual noncalcified plaque in RCA --FFR negative  . ESOPHAGOGASTRODUODENOSCOPY     w/baloon x 2  . ESOPHAGOGASTRODUODENOSCOPY N/A 11/08/2012   Procedure: ESOPHAGOGASTRODUODENOSCOPY (EGD);  Surgeon: Gatha Mayer, MD;  Location: [facility];  Service: Endoscopy;  Laterality: N/A;  . LEFT HEART CATH AND CORONARY ANGIOGRAPHY  N/A 06/28/2018   Procedure: LEFT HEART CATH AND CORONARY ANGIOGRAPHY;  Surgeon: Martinique, Peter M, MD;  Location: [place];  Service: Cardiovascular:  90% prox RCA -> DES PCI. Ost-mLM 30%. Ost-prox Cx 40%.   EF 55-65%. LVEDP 15 mmHg.   [name] RIGHT HEART CATH N/A 07/11/2018   Procedure: RIGHT HEART CATH;  Surgeon: Leonie Man, MD;  Location: [facility];;   Relatively normal Right Heart Cath pressures: PA pressure 26/14 mmHg-mean 20 mmHg; PCWP 13 mmHg.  RAP 6 mmHg, RVP 28/3 mmHg-EDP 10 mmHg. Severely reduced cardiac output and index by Fick: 3.63 and 2.07.  . TEE WITHOUT CARDIOVERSION N/A 07/11/2018   Procedure: TRANSESOPHAGEAL ECHOCARDIOGRAM (TEE);  Surgeon: [name], MD;  Location: [facility] ENDOSCOPY;  Service: Cardiology;;  Severe aortic regurgitation-type III. (suggested by holodiastolic flow reversal and descending aorta-vena contracta 6 mm).  . TOTAL HIP ARTHROPLASTY     Left  . TRANSTHORACIC ECHOCARDIOGRAM  05/2018   EF 60-65%.  No R WMA.  GR 1 DD.  Mild aortic stenosis with severe regurgitation.  Mild MR..  Only mild LV dilation noted.  . TRANSTHORACIC ECHOCARDIOGRAM  12/2018    EF 60-65%. Mild LVH. Gr1 DD. Mod AoV thickening. Mod-Severe AI, ~ mlid AS.   Social History   Social History Narrative   Married   2 children   No regular exercise         family history includes Coronary artery disease in her mother and another family member; Diabetes in her mother and son; Hypertension in her father; Kidney cancer in her sister; Kidney disease in an other family member.   Review of Systems As above  Objective:   Physical Exam BP (!) 108/50 (BP Location: Left Arm, Patient Position: Sitting, Cuff Size: Normal)   Pulse 88   Temp 98.1 F (36.7 C)   Ht 5\' 5"  (1.651 m)   Wt 164 lb (74.4 kg)   BMI 27.29 kg/m  Chronically ill in wheelchair Neck supple no mass Lungs clear but decreased Ht s1 s2 - I cannot hear murmur abd soft Alert and oriented x 3 approp  mood/affect

## 2019-08-06 ENCOUNTER — Ambulatory Visit
Admission: RE | Admit: 2019-08-06 | Discharge: 2019-08-06 | Disposition: A | Discharge status: Home / Self Care | Payer: Medicare HMO | Source: Ambulatory Visit | Attending: Internal Medicine | Admitting: Internal Medicine

## 2019-08-06 DIAGNOSIS — R1310 Dysphagia, unspecified: Secondary | ICD-10-CM

## 2019-08-06 DIAGNOSIS — K224 Dyskinesia of esophagus: Secondary | ICD-10-CM | POA: Diagnosis not present

## 2019-08-06 DIAGNOSIS — K449 Diaphragmatic hernia without obstruction or gangrene: Secondary | ICD-10-CM | POA: Diagnosis not present

## 2019-08-06 DIAGNOSIS — R1319 Other dysphagia: Secondary | ICD-10-CM

## 2019-08-12 ENCOUNTER — Ambulatory Visit: Payer: Medicare HMO | Admitting: Cardiology

## 2019-08-12 ENCOUNTER — Other Ambulatory Visit: Payer: Self-pay

## 2019-08-12 ENCOUNTER — Encounter: Payer: Self-pay | Admitting: Cardiology

## 2019-08-12 VITALS — BP 117/51 | HR 86 | Ht 65.0 in | Wt 165.8 lb

## 2019-08-12 DIAGNOSIS — I214 Non-ST elevation (NSTEMI) myocardial infarction: Secondary | ICD-10-CM | POA: Diagnosis not present

## 2019-08-12 DIAGNOSIS — Z993 Dependence on wheelchair: Secondary | ICD-10-CM | POA: Diagnosis not present

## 2019-08-12 DIAGNOSIS — F17210 Nicotine dependence, cigarettes, uncomplicated: Secondary | ICD-10-CM | POA: Diagnosis not present

## 2019-08-12 DIAGNOSIS — I25119 Atherosclerotic heart disease of native coronary artery with unspecified angina pectoris: Secondary | ICD-10-CM | POA: Diagnosis not present

## 2019-08-12 DIAGNOSIS — Z955 Presence of coronary angioplasty implant and graft: Secondary | ICD-10-CM

## 2019-08-12 DIAGNOSIS — I351 Nonrheumatic aortic (valve) insufficiency: Secondary | ICD-10-CM

## 2019-08-12 DIAGNOSIS — I10 Essential (primary) hypertension: Secondary | ICD-10-CM | POA: Diagnosis not present

## 2019-08-12 DIAGNOSIS — Z720 Tobacco use: Secondary | ICD-10-CM

## 2019-08-12 DIAGNOSIS — R69 Illness, unspecified: Secondary | ICD-10-CM | POA: Diagnosis not present

## 2019-08-12 DIAGNOSIS — E785 Hyperlipidemia, unspecified: Secondary | ICD-10-CM

## 2019-08-12 NOTE — Progress Notes (Signed)
Primary Care Provider: Cassandria Anger, MD Cardiologist: [name], MD Electrophysiologist: None  Clinic Note: No chief complaint on file.   HPI:    Kaylee Hansen is a 64 y.o. female with a PMH notable for CAD having PCI (non-STEMI), moderate-severe aortic insufficiency, severe COPD with history of lung cancer (chemo-RXT) who presents today for 62-month follow-up after echocardiogram.  Kaylee Hansen was last seen in July 2020.  Was not using her portable oxygen.  No significant worsening dyspnea.  Noted some chest discomfort not exertional.  Symptoms worse with deep inspiration.  Palpitations.  Cannot sleep lying flat, not real PND.  Recent Hospitalizations: COPD exacerbation in November 2020  Reviewed  CV studies:    The following studies were reviewed today: (if available, images/films reviewed: From Epic Chart or Care Everywhere) . 2D echo July 09, 2019: EF 60 to 65%.  GR 1 DD.  Moderate-severe aortic regurgitation with mild to moderate stenosis.  Overall relatively stable.:   Interval History:   Kaylee Hansen returns here today for follow-up along with her husband who provides most of the history.  Emmarie continues to be pre much wheelchair-bound, and uses a power scooter at home.  She tells me that from angina standpoint seems doing fairly well.  May be 1 or 2 doses of nitroglycerin in the last 6 months.  She does have occasional spells of palpitations but they are not long lasting.  Frequent but short.  She describes as a fluttering sensation that lasts may be seconds to a minute or 2. Although she gets short of breath when she lies down is not really consistent with orthopnea or PND.  She does have some mild edema but is doing well with her low-dose Lasix..  CV Review of Symptoms (Summary): positive for - dyspnea on exertion, shortness of breath and Rare episodes of chest discomfort noted.  Used NTG 2-3 times.  Fluttering sensations but no prolonged  palpitations. negative for - edema, irregular heartbeat, rapid heart rate or No significant PND orthopnea although she does get short of breath at night when laying supine, it is not noted when she first lies down.  No TIA, amaurosis fugax or syncope/near syncope.  She does have some lightheadedness but this is associated with her migraines.  The patient does not have symptoms concerning for COVID-19 infection (fever, chills, cough, or new shortness of breath).  The patient is practicing social distancing & Masking.  Unfortunately, she and her husband are having a hard time getting an appointment for COVID-19 vaccines.  It appears that he received a call back on his home phone number while he was here for his visit.   REVIEWED OF SYSTEMS   A comprehensive ROS was performed. Review of Systems  Constitutional: Positive for malaise/fatigue. Negative for weight loss.  HENT: Negative for nosebleeds.   Respiratory: Positive for cough, shortness of breath and wheezing. Negative for sputum production.        Pretty much baseline COPD symptoms.  Usually wears oxygen most the time when she is at home, and always at night.  She does not have a portable concentrator and therefore does not take it with her on outings.  Gastrointestinal: Positive for blood in stool (Off-and-on with wiping.  Has pending GI procedure (will need to hold Plavix).  Genitourinary: Positive for dysuria and urgency.       Feels like she has chronic off-and-on UTIs.  Currently has some dysuria.  Musculoskeletal: Positive for myalgias.  She is pretty much wheelchair-bound at home.  Needs up for transfer with exception of may be from the couch to her chair.  She does have a power wheelchair at home  Neurological: Positive for weakness (Bilateral leg weakness -chronic) and headaches (Migraine headaches seem to be doing better.).  Endo/Heme/Allergies: Bruises/bleeds easily.  Psychiatric/Behavioral: Positive for depression and  memory loss. The patient has insomnia. The patient is not nervous/anxious.    I have reviewed and (if needed) personally updated the patient's problem list, medications, allergies, past medical and surgical history, social and family history.   PAST MEDICAL HISTORY   Past Medical History:  Diagnosis Date  . Acute respiratory failure following trauma and surgery ([place]) 08/26/2012  . Anxiety   . Aortic insufficiency with aortic stenosis 04/2017   TTE December 2019: Mild AS with severe regurgitation.  Mild LA dilation.;;  TEE January 2016: Severe-type III aortic regurgitation (holodiastolic flow reversal in the a sending aorta & vena contracta =6.   . CAD S/P percutaneous coronary angioplasty 11/2017   Proximal RCA PCI Synergy DES 3.5 mm x 16 mm (3.8 mm). Ost-mLM 30%. Ost-prox Cx 40%.  . Collagenous colitis   . Colon adenomas 2011  . Constipation    Chronic abdominal pain and constipation  . COPD (chronic obstructive pulmonary disease) ([place])   . Depression   . Diverticulosis   . Esophageal stricture 11/08/2012   Ulcer noted in 2010  . GERD (gastroesophageal reflux disease)   . Helicobacter pylori gastritis 2010   Pylera Tx  . History of cholecystectomy   . Hx of appendectomy   . Hx of cancer of lung 1999  . Hx of hysterectomy   . Hyperlipidemia   . Hypothyroidism   . Low back pain   . Non-STEMI (non-ST elevated myocardial infarction) ([place]) 11/2017   RCA PCI  . Osteoarthritis of knee    bilateral knee  . Seizures ([place])   . Stroke [facility])    CVA, hx of 97  . 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 ABDOMINAL HYSTERECTOMY     complete 1992  . ABDOMINAL SURGERY     Exploratory  . APPENDECTOMY    . BALLOON DILATION N/A 11/08/2012   Procedure: BALLOON DILATION;  Surgeon: Gatha Mayer, MD;  Location: [facility] ENDOSCOPY;  Service: Endoscopy;  Laterality: N/A;  . CHOLECYSTECTOMY    . COLONOSCOPY W/ BIOPSIES     multiple   . CORONARY STENT INTERVENTION N/A 06/28/2018   Procedure: CORONARY STENT INTERVENTION;  Surgeon: Martinique, Peter M, MD;  Location: [facility] CV LAB;  Service: Cardiovascular;  Laterality: N/A;  90% prox RCA -> PCI with Synergy DES 3.5 mm x 16 mm (3.8 mm).  . CT CTA CORONARY W/CA SCORE W/CM &/OR WO/CM  05/2017   Coronary calcium score 2.9. Intermediate risk. Unusual noncalcified plaque in RCA --FFR negative  . ESOPHAGOGASTRODUODENOSCOPY     w/baloon x 2  . ESOPHAGOGASTRODUODENOSCOPY N/A 11/08/2012   Procedure: ESOPHAGOGASTRODUODENOSCOPY (EGD);  Surgeon: Gatha Mayer, MD;  Location: [facility];  Service: Endoscopy;  Laterality: N/A;  . LEFT HEART CATH AND CORONARY ANGIOGRAPHY N/A 06/28/2018   Procedure: LEFT HEART CATH AND CORONARY ANGIOGRAPHY;  Surgeon: Martinique, Peter M, MD;  Location: [facility];  Service: Cardiovascular:  90% prox RCA -> DES PCI. Ost-mLM 30%. Ost-prox Cx 40%.   EF 55-65%. LVEDP 15 mmHg.   Kaylee Hansen Kitchen RIGHT HEART CATH N/A 07/11/2018  Procedure: RIGHT HEART CATH;  Surgeon: Leonie Man, MD;  Location: [facility] INVASIVE CV LAB;;   Relatively normal Right Heart Cath pressures: PA pressure 26/14 mmHg-mean 20 mmHg; PCWP 13 mmHg.  RAP 6 mmHg, RVP 28/3 mmHg-EDP 10 mmHg. Severely reduced cardiac output and index by Fick: 3.63 and 2.07.  . TEE WITHOUT CARDIOVERSION N/A 07/11/2018   Procedure: TRANSESOPHAGEAL ECHOCARDIOGRAM (TEE);  Surgeon: [name], MD;  Location: [facility] ENDOSCOPY;  Service: Cardiology;;  Severe aortic regurgitation-type III. (suggested by holodiastolic flow reversal and descending aorta-vena contracta 6 mm).  . TOTAL HIP ARTHROPLASTY     Left  . TRANSTHORACIC ECHOCARDIOGRAM  05/2018   EF 60-65%.  No R WMA.  GR 1 DD.  Mild aortic stenosis with severe regurgitation.  Mild MR..  Only mild LV dilation noted.  . TRANSTHORACIC ECHOCARDIOGRAM  12/2018    EF 60-65%. Mild LVH. Gr1 DD. Mod AoV thickening. Mod-Severe AI, ~ mlid AS.      MEDICATIONS/ALLERGIES   Current Meds   Medication Sig  . acetaminophen (TYLENOL) 325 MG tablet Take 2 tablets (650 mg total) by mouth every 4 (four) hours as needed for headache or mild pain.  Kaylee Hansen Kitchen albuterol (PROVENTIL HFA;VENTOLIN HFA) 108 (90 Base) MCG/ACT inhaler Inhale 2 puffs into the lungs every 6 (six) hours as needed for wheezing or shortness of breath.  Kaylee Hansen Kitchen albuterol (PROVENTIL) (2.5 MG/3ML) 0.083% nebulizer solution Take 3 mLs (2.5 mg total) by nebulization every 4 (four) hours as needed for wheezing or shortness of breath.  . ALPRAZolam (XANAX) 0.25 MG tablet TAKE 1 TABLET (0.25 MG TOTAL) BY MOUTH AT BEDTIME AS NEEDED FOR ANXIETY.  . bisacodyl (DULCOLAX) 5 MG EC tablet Take 2 tablets (10 mg total) by mouth at bedtime.  . budesonide-formoterol (SYMBICORT) 160-4.5 MCG/ACT inhaler Inhale 2 puffs into the lungs 2 (two) times daily at 10 AM and 5 PM. Rinse mouth after each use  . clopidogrel (PLAVIX) 75 MG tablet TAKE 1 TABLET (75 MG TOTAL) BY MOUTH DAILY WITH BREAKFAST.  Kaylee Hansen Kitchen diltiazem (CARDIZEM CD) 120 MG 24 hr capsule TAKE 1 CAPSULE BY MOUTH AT BEDTIME.  . furosemide (LASIX) 20 MG tablet Take 1 tablet by mouth once daily  . guaiFENesin (MUCINEX) 600 MG 12 hr tablet Take 1 tablet (600 mg total) by mouth 2 (two) times daily as needed for cough or to loosen phlegm.  Kaylee Hansen Kitchen HYDROcodone-acetaminophen (NORCO) 10-325 MG tablet Take 1 tablet by mouth every 6 (six) hours as needed for severe pain. Please fill on or after 07/18/19  . isosorbide mononitrate (IMDUR) 30 MG 24 hr tablet Take 1 tablet by mouth once daily  . ketoconazole (NIZORAL) 2 % cream Apply 1 application topically daily.  Kaylee Hansen Kitchen levothyroxine (SYNTHROID, LEVOTHROID) 150 MCG tablet Take 1 tablet (150 mcg total) by mouth daily.  . nitroGLYCERIN (NITROSTAT) 0.4 MG SL tablet DISSOLVE ONE TABLET UNDER THE TONGUE EVERY 5 MINUTES AS NEEDED FOR CHEST PAIN.  DO NOT EXCEED A TOTAL OF 3 DOSES IN 15 MINUTES  . OXYGEN Inhale 2.5 L/min into the lungs at bedtime.  . pantoprazole (PROTONIX) 40 MG tablet  Take 1 tablet (40 mg total) by mouth daily before breakfast.  . PARoxetine (PAXIL) 40 MG tablet Take 1 tablet (40 mg total) by mouth daily.  . phenytoin (DILANTIN) 100 MG ER capsule TAKE 100 MG EVERY MORNING AND 200 MG EVERY NIGHT.  Kaylee Hansen Kitchen polyethylene glycol (MIRALAX / GLYCOLAX) 17 g packet Take 17 g by mouth daily.  . promethazine (PHENERGAN) 25 MG tablet Take  1 tablet (25 mg total) by mouth every 8 (eight) hours as needed for nausea or vomiting.  Kaylee Hansen Kitchen Respiratory Therapy Supplies (FLUTTER) DEVI As directed  . senna-docusate (SENOKOT S) 8.6-50 MG tablet Take 2 tablets by mouth at bedtime.  . SUMAtriptan (IMITREX) 100 MG tablet TAKE 1 TABLET BY MOUTH EVERY 2 HOURS AS NEEDED FOR MIAGRAINE/HEADACHE  . traZODone (DESYREL) 100 MG tablet TAKE 1 TABLET BY MOUTH EVERYDAY AT BEDTIME  . Vitamin D, Ergocalciferol, (DRISDOL) 1.25 MG (50000 UT) CAPS capsule TAKE ONE CAPSULE BY MOUTH ONE TIME PER WEEK    Allergies  Allergen Reactions  . Morphine     "Makes me go crazy."   . Nitrofuran Derivatives     ??confusion  . Oxycodone Hcl Other (See Comments)    Knocked her out for 6 days  . Penicillins Hives    Has patient had a PCN reaction causing immediate rash, facial/tongue/throat swelling, SOB or lightheadedness with hypotension: unknown Has patient had a PCN reaction causing severe rash involving mucus membranes or skin necrosis: unknown Has patient had a PCN reaction that required hospitalization No Has patient had a PCN reaction occurring within the last 10 years: No If all of the above answers are "NO", then may proceed with Cephalosporin use.   . Tape Other (See Comments)    Skin turns red and burns    SOCIAL HISTORY/FAMILY HISTORY   Social History   Tobacco Use  . Smoking status: Current Every Day Smoker    Packs/day: 1.00    Years: 48.00    Pack years: 48.00    Types: Cigarettes  . Smokeless tobacco: Never Used  Substance Use Topics  . Alcohol use: No  . Drug use: No   Social History    Social History Narrative   Married   2 children   No regular exercise          Family History family history includes Coronary artery disease in her mother and another family member; Diabetes in her mother and son; Hypertension in her father; Kidney cancer in her sister; Kidney disease in an other family member.  OBJCTIVE -PE, EKG, labs   Wt Readings from Last 3 Encounters:  08/12/19 165 lb 12.8 oz (75.2 kg)  08/05/19 164 lb (74.4 kg)  07/03/19 162 lb (73.5 kg)    Physical Exam: BP (!) 117/51   Pulse 86   Ht 5\' 5"  (1.651 m)   Wt 165 lb 12.8 oz (75.2 kg)   SpO2 94%   BMI 27.59 kg/m  Physical Exam  Constitutional: She is oriented to person, place, and time. No distress.  Chronically ill, nontoxic-appearing elderly woman.  Wheelchair-bound, currently in a pushing wheelchair.  Appears older than stated age.  Pervasive cigarette odor.  HENT:  Head: Normocephalic and atraumatic.  Mild temporal wasting  Neck: Decreased carotid pulses (Bifid pulse) present. No JVD present. Carotid bruit is not present (Radiated aortic stenosis murmur).  Cardiovascular: Normal rate, regular rhythm, S1 normal and S2 normal.  Occasional extrasystoles are present. PMI is not displaced. Exam reveals distant heart sounds and decreased pulses (Diminished pedal pulses bilaterally).  Murmur heard.  Harsh crescendo-decrescendo midsystolic murmur is present with a grade of 2/6 at the upper right sternal border radiating to the neck.  Low-pitched decrescendo presystolic murmur is present with a grade of 2/6 at the upper right sternal border. Pulmonary/Chest: She has no rales. She exhibits no tenderness.  Baseline mild accessory muscle use but nonlabored.  Diffuse coarse rhonchi and wheezing.  Prolonged expiratory phase.  Abdominal: Soft. Bowel sounds are normal. She exhibits no distension. There is no abdominal tenderness.  Unable to assess HSM  Musculoskeletal:        General: No edema.     Cervical  back: Normal range of motion and neck supple.     Comments: Sitting in wheelchair.  Bilateral leg weakness  Neurological: She is alert and oriented to person, place, and time.  Psychiatric: She has a normal mood and affect. Her behavior is normal.     Adult ECG Report  Rate: 86 ;  Rhythm: normal sinus rhythm; complete RBBB.  LVH.  Inferior MI, age undetermined.  Cannot exclude anterior MI, age-indeterminate.  Narrative Interpretation: Relatively stable EKG.  Recent Labs:    Lab Results  Component Value Date   CHOL 128 06/29/2018   HDL 35 (L) 06/29/2018   LDLCALC 56 06/29/2018   LDLDIRECT 187.0 07/14/2015   TRIG 184 (H) 06/29/2018   CHOLHDL 3.7 06/29/2018   Lab Results  Component Value Date   CREATININE 1.08 (H) 04/21/2019   BUN 6 (L) 04/21/2019   NA 137 04/21/2019   K 3.6 04/21/2019   CL 100 04/21/2019   CO2 28 04/21/2019   Lab Results  Component Value Date   TSH 1.84 01/01/2019    ASSESSMENT/PLAN    Problem List Items Addressed This Visit    Severe Stage C1 aortic regurgitation by prior echocardiography - Primary (Chronic)   Relevant Orders   EKG 12-Lead (Completed)   Coronary artery disease involving native coronary artery of native heart with angina pectoris (HCC) (Chronic)   Relevant Orders   EKG 12-Lead (Completed)   Hyperlipidemia with target LDL less than 100 (Chronic)   Cigarette smoker (Chronic)   Essential hypertension, benign (Chronic)   Tobacco abuse disorder (Chronic)   Wheelchair dependence   NSTEMI (non-ST elevated myocardial infarction) ([place])   Relevant Orders   EKG 12-Lead (Completed)       COVID-19 Education: The signs and symptoms of COVID-19 were discussed with the patient and how to seek care for testing (follow up with PCP or arrange E-visit).   The importance of social distancing was discussed today.  I spent a total of 32minutes with the patient. >  50% of the time was spent in direct patient consultation.  Additional time spent  with chart review  / charting (studies, outside notes, etc): 8 Total Time: 28 min   Current medicines are reviewed at length with the patient today.  (+/- concerns) n/a   Patient Instructions / Medication Changes & Studies & Tests Ordered   Patient Instructions  Medication Instructions:  No changes If you needed to use the sublingual nitroglycerin for chest discomfort  , the next 2 days afterwards double the dose of Imdur ( isosorbide)   30 mg  To 60 mg  Then return to 30 mg after the 2 day increase  *If you need a refill on your cardiac medications before your next appointment, please call your pharmacy*  Lab Work: Not  needed  Testing/Procedures: Not needed  Follow-Up: At [facility], you and your health needs are our priority.  As part of our continuing mission to provide you with exceptional heart care, we have created designated Provider Care Teams.  These Care Teams include your primary Cardiologist (physician) and Advanced Practice Providers (APPs -  Physician Assistants and Nurse Practitioners) who all work together to provide you with the care you need, when you need it.  Your next  appointment:   6 month(s)  The format for your next appointment:   Virtual Visit   Provider:   [name], MD  Other Instructions     Studies Ordered:   Orders Placed This Encounter  Procedures  . EKG 12-Lead     [name], M.D., M.S. Interventional Cardiologist   Pager # [phone] Phone # [phone] [address] [place], [place] [address]   Thank you for choosing Heartcare at [facility]!!

## 2019-08-12 NOTE — Patient Instructions (Addendum)
Medication Instructions:  No changes If you needed to use the sublingual nitroglycerin for chest discomfort  , the next 2 days afterwards double the dose of Imdur ( isosorbide)   30 mg  To 60 mg  Then return to 30 mg after the 2 day increase  *If you need a refill on your cardiac medications before your next appointment, please call your pharmacy*  Lab Work: Not  needed  Testing/Procedures: Not needed  Follow-Up: At [facility], you and your health needs are our priority.  As part of our continuing mission to provide you with exceptional heart care, we have created designated Provider Care Teams.  These Care Teams include your primary Cardiologist (physician) and Advanced Practice Providers (APPs -  Physician Assistants and Nurse Practitioners) who all work together to provide you with the care you need, when you need it.  Your next appointment:   6 month(s)  The format for your next appointment:   Virtual Visit   Provider:   [name], MD  Other Instructions

## 2019-08-14 NOTE — Progress Notes (Signed)
I saw her earlier this week.  I am working on notes. We will forward you her note.  But she is fine to hold any antiplatelet agents for her GI scope.

## 2019-08-19 DIAGNOSIS — Z955 Presence of coronary angioplasty implant and graft: Secondary | ICD-10-CM | POA: Insufficient documentation

## 2019-08-19 NOTE — Assessment & Plan Note (Signed)
She still smokes about half pack a day.  Has been trying to cut back, but just has not been able to do it.  She enjoys smoking and also indicates that is just too hard.  She is hoping to cut back slowly.  I did spend some time counseling her obviously on the importance of smoking cessation and she is fully aware of the risks, and indicates that she is already been dealing with it for years.

## 2019-08-19 NOTE — Assessment & Plan Note (Signed)
EF still remains greater than 60% and aortic regurgitation noted to be moderate to severe still with mild to moderate stenosis.  Does not appear to have progressed any. Relatively stable overall from cardiac standpoint.  Per Dr. Roxy Manns, dimensional aortic valve replacement would be essentially prohibitive risk.  Until TAVR potentially becomes an option, no real choice but to continue medical management.  Thankfully, she does not seem to be having too many symptoms, partly because she is essentially wheelchair-bound.  Difficult to assess the level of dyspnea between her COPD and aortic regurgitation.  Plan: She is on low-dose Lasix with no significant edema. She is on diltiazem (choosing calcium channel blocker over beta-blocker because of COPD) for pulmonary pressures, antianginal benefit and mild blood pressure control.  She really has been somewhat hypotensive and therefore would not want aggressive afterload reduction.

## 2019-08-19 NOTE — Assessment & Plan Note (Signed)
Rare episodes of chest pain that may or may not be nitro responsive.  She could have microvascular disease, but I doubt that she has developed any issues with her stent. I recommended that she takes as needed nitroglycerin, that she should increase her Imdur dose to 2 tablets for about 2 to 3 days post nitroglycerin use.  Otherwise her angina seems to be controlled with diltiazem and Imdur. She continues to be on rosuvastatin with pretty good results based on recent labs.

## 2019-08-19 NOTE — Assessment & Plan Note (Signed)
Just about a year and a half out from her PCI.  She is now on maintenance dose completely well without aspirin.  Would be okay to hold Plavix 5-7 days preop surgery or procedures.  Have forwarded this recommendation to Dr. Carlean Purl based on plans for upcoming GI procedure.

## 2019-08-19 NOTE — Assessment & Plan Note (Signed)
Really not truthfully hypertensive.  We are using isosorbide and diltiazem as much for antianginal benefit as anything else.  No real blood pressure room to use afterload reduction.

## 2019-08-19 NOTE — Assessment & Plan Note (Signed)
Just about a year out from her non-STEMI.  No further significant anginal symptoms.  Rare use of nitroglycerin.  Preserved EF with no significant wall motion normality.

## 2019-08-19 NOTE — Assessment & Plan Note (Signed)
In addition to her OPD as well as valvular heart disease comorbidities, she is also completely immobilized mostly because of musculoskeletal issues.  Poor candidate for surgery.  TAVR is only option were she to have worsening issues with aortic stenosis/regurgitation.

## 2019-08-20 ENCOUNTER — Other Ambulatory Visit: Payer: Self-pay | Admitting: *Deleted

## 2019-08-20 DIAGNOSIS — R1319 Other dysphagia: Secondary | ICD-10-CM

## 2019-08-20 DIAGNOSIS — R1310 Dysphagia, unspecified: Secondary | ICD-10-CM

## 2019-08-20 DIAGNOSIS — C3490 Malignant neoplasm of unspecified part of unspecified bronchus or lung: Secondary | ICD-10-CM | POA: Diagnosis not present

## 2019-08-21 ENCOUNTER — Other Ambulatory Visit (HOSPITAL_COMMUNITY)
Admission: RE | Admit: 2019-08-21 | Discharge: 2019-08-21 | Disposition: A | Discharge status: Home / Self Care | Payer: Medicare HMO | Source: Ambulatory Visit | Attending: Internal Medicine | Admitting: Internal Medicine

## 2019-08-21 ENCOUNTER — Encounter (HOSPITAL_COMMUNITY): Payer: Self-pay | Admitting: Physician Assistant

## 2019-08-21 ENCOUNTER — Telehealth: Payer: Self-pay | Admitting: *Deleted

## 2019-08-21 ENCOUNTER — Encounter (HOSPITAL_COMMUNITY): Payer: Self-pay | Admitting: Internal Medicine

## 2019-08-21 DIAGNOSIS — Z01812 Encounter for preprocedural laboratory examination: Secondary | ICD-10-CM | POA: Diagnosis not present

## 2019-08-21 DIAGNOSIS — Z20822 Contact with and (suspected) exposure to covid-19: Secondary | ICD-10-CM | POA: Diagnosis not present

## 2019-08-21 LAB — SARS CORONAVIRUS 2 (TAT 6-24 HRS): SARS Coronavirus 2: NEGATIVE

## 2019-08-21 NOTE — Telephone Encounter (Signed)
The patient's husband changed his mind and will allow his wife to have the EGD on 3/1.

## 2019-08-25 ENCOUNTER — Encounter (HOSPITAL_COMMUNITY)
Admission: RE | Disposition: A | Discharge status: Home / Self Care | Payer: Self-pay | Source: Home / Self Care | Attending: Internal Medicine

## 2019-08-25 ENCOUNTER — Encounter (HOSPITAL_COMMUNITY): Payer: Self-pay | Admitting: Internal Medicine

## 2019-08-25 ENCOUNTER — Other Ambulatory Visit: Payer: Self-pay

## 2019-08-25 ENCOUNTER — Ambulatory Visit (HOSPITAL_COMMUNITY)
Admission: RE | Admit: 2019-08-25 | Discharge: 2019-08-25 | Disposition: A | Discharge status: Home / Self Care | Payer: Medicare HMO | Attending: Internal Medicine | Admitting: Internal Medicine

## 2019-08-25 ENCOUNTER — Ambulatory Visit (HOSPITAL_COMMUNITY): Payer: Medicare HMO | Admitting: Certified Registered"

## 2019-08-25 DIAGNOSIS — I10 Essential (primary) hypertension: Secondary | ICD-10-CM | POA: Diagnosis not present

## 2019-08-25 DIAGNOSIS — Z955 Presence of coronary angioplasty implant and graft: Secondary | ICD-10-CM | POA: Diagnosis not present

## 2019-08-25 DIAGNOSIS — Z888 Allergy status to other drugs, medicaments and biological substances status: Secondary | ICD-10-CM | POA: Diagnosis not present

## 2019-08-25 DIAGNOSIS — I352 Nonrheumatic aortic (valve) stenosis with insufficiency: Secondary | ICD-10-CM | POA: Diagnosis not present

## 2019-08-25 DIAGNOSIS — Z993 Dependence on wheelchair: Secondary | ICD-10-CM | POA: Insufficient documentation

## 2019-08-25 DIAGNOSIS — R69 Illness, unspecified: Secondary | ICD-10-CM | POA: Diagnosis not present

## 2019-08-25 DIAGNOSIS — Z880 Allergy status to penicillin: Secondary | ICD-10-CM | POA: Diagnosis not present

## 2019-08-25 DIAGNOSIS — F329 Major depressive disorder, single episode, unspecified: Secondary | ICD-10-CM | POA: Diagnosis not present

## 2019-08-25 DIAGNOSIS — F17200 Nicotine dependence, unspecified, uncomplicated: Secondary | ICD-10-CM | POA: Diagnosis not present

## 2019-08-25 DIAGNOSIS — F419 Anxiety disorder, unspecified: Secondary | ICD-10-CM | POA: Insufficient documentation

## 2019-08-25 DIAGNOSIS — Z79899 Other long term (current) drug therapy: Secondary | ICD-10-CM | POA: Diagnosis not present

## 2019-08-25 DIAGNOSIS — J449 Chronic obstructive pulmonary disease, unspecified: Secondary | ICD-10-CM | POA: Insufficient documentation

## 2019-08-25 DIAGNOSIS — I2510 Atherosclerotic heart disease of native coronary artery without angina pectoris: Secondary | ICD-10-CM | POA: Insufficient documentation

## 2019-08-25 DIAGNOSIS — I252 Old myocardial infarction: Secondary | ICD-10-CM | POA: Insufficient documentation

## 2019-08-25 DIAGNOSIS — Z885 Allergy status to narcotic agent status: Secondary | ICD-10-CM | POA: Insufficient documentation

## 2019-08-25 DIAGNOSIS — Z91048 Other nonmedicinal substance allergy status: Secondary | ICD-10-CM | POA: Insufficient documentation

## 2019-08-25 DIAGNOSIS — R1319 Other dysphagia: Secondary | ICD-10-CM

## 2019-08-25 DIAGNOSIS — Z8673 Personal history of transient ischemic attack (TIA), and cerebral infarction without residual deficits: Secondary | ICD-10-CM | POA: Insufficient documentation

## 2019-08-25 DIAGNOSIS — K222 Esophageal obstruction: Secondary | ICD-10-CM | POA: Diagnosis not present

## 2019-08-25 DIAGNOSIS — R1310 Dysphagia, unspecified: Secondary | ICD-10-CM

## 2019-08-25 DIAGNOSIS — M1990 Unspecified osteoarthritis, unspecified site: Secondary | ICD-10-CM | POA: Diagnosis not present

## 2019-08-25 DIAGNOSIS — K5909 Other constipation: Secondary | ICD-10-CM | POA: Insufficient documentation

## 2019-08-25 DIAGNOSIS — K219 Gastro-esophageal reflux disease without esophagitis: Secondary | ICD-10-CM | POA: Diagnosis not present

## 2019-08-25 DIAGNOSIS — I214 Non-ST elevation (NSTEMI) myocardial infarction: Secondary | ICD-10-CM | POA: Diagnosis not present

## 2019-08-25 DIAGNOSIS — E785 Hyperlipidemia, unspecified: Secondary | ICD-10-CM | POA: Diagnosis not present

## 2019-08-25 HISTORY — PX: MALONEY DILATION: SHX5535

## 2019-08-25 HISTORY — PX: ESOPHAGOGASTRODUODENOSCOPY (EGD) WITH PROPOFOL: SHX5813

## 2019-08-25 SURGERY — ESOPHAGOGASTRODUODENOSCOPY (EGD) WITH PROPOFOL
Anesthesia: Monitor Anesthesia Care

## 2019-08-25 MED ORDER — PROPOFOL 500 MG/50ML IV EMUL
INTRAVENOUS | Status: DC | PRN
  Administered 2019-08-25: 125 ug/kg/min via INTRAVENOUS

## 2019-08-25 MED ORDER — SODIUM CHLORIDE 0.9 % IV SOLN: INTRAVENOUS | Status: DC

## 2019-08-25 MED ORDER — LIDOCAINE 2% (20 MG/ML) 5 ML SYRINGE
INTRAMUSCULAR | Status: DC | PRN
  Administered 2019-08-25: 60 mg via INTRAVENOUS

## 2019-08-25 MED ORDER — PROPOFOL 10 MG/ML IV BOLUS
INTRAVENOUS | Status: DC | PRN
  Administered 2019-08-25: 20 mg via INTRAVENOUS

## 2019-08-25 MED ORDER — PROPOFOL 10 MG/ML IV BOLUS
INTRAVENOUS | Status: AC
  Filled 2019-08-25: qty 20

## 2019-08-25 MED ORDER — PROPOFOL 500 MG/50ML IV EMUL
INTRAVENOUS | Status: AC
  Filled 2019-08-25: qty 50

## 2019-08-25 MED ORDER — LACTATED RINGERS IV SOLN
INTRAVENOUS | Status: DC
  Administered 2019-08-25: 1000 mL via INTRAVENOUS

## 2019-08-25 SURGICAL SUPPLY — 14 items

## 2019-08-25 NOTE — Anesthesia Postprocedure Evaluation (Signed)
Anesthesia Post Note  Patient: Kaylee Hansen  Procedure(s) Performed: ESOPHAGOGASTRODUODENOSCOPY (EGD) WITH PROPOFOL (N/A ) [place]     Patient location during evaluation: PACU Anesthesia Type: MAC Level of consciousness: awake and alert and oriented Pain management: pain level controlled Vital Signs Assessment: post-procedure vital signs reviewed and stable Respiratory status: spontaneous breathing, nonlabored ventilation and respiratory function stable Cardiovascular status: blood pressure returned to baseline Postop Assessment: no apparent nausea or vomiting Anesthetic complications: no    Last Vitals:  Vitals:   08/25/19 0955 08/25/19 1000  BP:  (!) 128/36  Pulse: 80 81  Resp: 19 19  Temp:    SpO2: 100% 100%    Last Pain:  Vitals:   08/25/19 1000  TempSrc:   PainSc: 0-No pain                 Brennan Bailey

## 2019-08-25 NOTE — Transfer of Care (Signed)
Immediate Anesthesia Transfer of Care Note  Patient: Kaylee Hansen  Procedure(s) Performed: ESOPHAGOGASTRODUODENOSCOPY (EGD) WITH PROPOFOL (N/A ) MALONEY DILATION  Patient Location: PACU and Endoscopy Unit  Anesthesia Type:MAC  Level of Consciousness: drowsy and patient cooperative  Airway & Oxygen Therapy: Patient Spontanous Breathing and Patient connected to face mask oxygen  Post-op Assessment: Report given to RN and Post -op Vital signs reviewed and stable  Post vital signs: Reviewed and stable  Last Vitals:  Vitals Value Taken Time  BP 131/38 08/25/19 0936  Temp    Pulse 78 08/25/19 0938  Resp 17 08/25/19 0938  SpO2 100 % 08/25/19 0938  Vitals shown include unvalidated device data.  Last Pain:  Vitals:   08/25/19 0831  TempSrc: Oral  PainSc: 0-No pain         Complications: No apparent anesthesia complications

## 2019-08-25 NOTE — Discharge Instructions (Signed)
I dilated the esophagus again.  I hope that helps you swallow better.  If not - let me know.  DO NOT TAKE CLOPIDOGREL OR PLAVIX TODAY - START TOMORROW.  I appreciate the opportunity to care for you. Gatha Mayer, MD, FACG   YOU HAD AN ENDOSCOPIC PROCEDURE TODAY: Refer to the procedure report and other information in the discharge instructions given to you for any specific questions about what was found during the examination. If this information does not answer your questions, please call Dr. Celesta Aver office at [phone] to clarify.   YOU SHOULD EXPECT: Some feelings of bloating in the abdomen. Passage of more gas than usual. Walking can help get rid of the air that was put into your GI tract during the procedure and reduce the bloating. If you had a lower endoscopy (such as a colonoscopy or flexible sigmoidoscopy) you may notice spotting of blood in your stool or on the toilet paper. Some abdominal soreness may be present for a day or two, also.  DIET:   Clear liquids only until 11 AM - then soft foods today and then try normal foods tomorrow.   ACTIVITY: Your care partner should take you home directly after the procedure. You should plan to take it easy, moving slowly for the rest of the day. You can resume normal activity the day after the procedure however YOU SHOULD NOT DRIVE, use power tools, machinery or perform tasks that involve climbing or major physical exertion for 24 hours (because of the sedation medicines used during the test).   SYMPTOMS TO REPORT IMMEDIATELY: A gastroenterologist can be reached at any hour. Please call [phone]  for any of the following symptoms:   Following upper endoscopy (EGD, EUS, ERCP, esophageal dilation) Vomiting of blood or coffee ground material  New, significant abdominal pain  New, significant chest pain or pain under the shoulder blades  Painful or persistently difficult swallowing  New shortness of breath  Black, tarry-looking  or red, bloody stools

## 2019-08-25 NOTE — Anesthesia Procedure Notes (Signed)
Procedure Name: MAC Date/Time: 08/25/2019 9:15 AM Performed by: Eben Burow, CRNA Pre-anesthesia Checklist: Patient identified, Emergency Drugs available, Suction available, Patient being monitored and Timeout performed Oxygen Delivery Method: Simple face mask Dental Injury: Teeth and Oropharynx as per pre-operative assessment

## 2019-08-25 NOTE — Op Note (Signed)
[facility] Patient Name: Kaylee Hansen Procedure Date: 08/25/2019 MRN: [id number] Attending MD: Gatha Mayer , MD Date of Birth: May 06, 1956 CSN: [id number] Age: 64 Admit Type: Outpatient Procedure:                Upper GI endoscopy Indications:              Dysphagia, Stenosis of the esophagus Providers:                Gatha Mayer, MD, Cleda Daub, RN, Cherylynn Ridges, Technician Referring MD:              Medicines:                Propofol per Anesthesia, Monitored Anesthesia Care Complications:            No immediate complications. Estimated Blood Loss:     Estimated blood loss was minimal. Procedure:                Pre-Anesthesia Assessment:                           - Prior to the procedure, a History and Physical                            was performed, and patient medications and                            allergies were reviewed. The patient's tolerance of                            previous anesthesia was also reviewed. The risks                            and benefits of the procedure and the sedation                            options and risks were discussed with the patient.                            All questions were answered, and informed consent                            was obtained. Prior Anticoagulants: The patient                            last took Plavix (clopidogrel) 5 days prior to the                            procedure. ASA Grade Assessment: III - A patient                            with severe systemic disease. After reviewing the  risks and benefits, the patient was deemed in                            satisfactory condition to undergo the procedure.                           After obtaining informed consent, the endoscope was                            passed under direct vision. Throughout the                            procedure, the patient's blood pressure, pulse, and                          oxygen saturations were monitored continuously. The                            GIF-H190 ([id number] Olympus gastroscope was                            introduced through the mouth, and advanced to the                            second part of duodenum. The upper GI endoscopy was                            accomplished without difficulty. The patient                            tolerated the procedure well. Scope In: Scope Out: Findings:      One benign-appearing, intrinsic mild stenosis was found in the lower       third of the esophagus. The scope was withdrawn. Dilation was performed       with a Maloney dilator with mild resistance at 86 Fr. The dilation site       was examined following endoscope reinsertion and showed mild mucosal       disruption. Estimated blood loss was minimal.      The exam was otherwise without abnormality.      The cardia and gastric fundus were normal on retroflexion. Impression:               - Benign-appearing esophageal stenosis. Dilated. No                            heme on dilator - mild heme and mucosal disruption                            distal esophagus on re-inspection                           - The examination was otherwise normal.                           - No specimens collected. Moderate Sedation:  Not Applicable - Patient had care per Anesthesia. Recommendation:           - Patient has a contact number available for                            emergencies. The signs and symptoms of potential                            delayed complications were discussed with the                            patient. Return to normal activities tomorrow.                            Written discharge instructions were provided to the                            patient.                           - Clear liquids x 1 hour then soft foods rest of                            day. Start prior diet tomorrow.                           -  Continue present medications.                           - Resume Plavix (clopidogrel) at prior dose                            tomorrow. Procedure Code(s):        --- Professional ---                           [phone], Esophagogastroduodenoscopy, flexible,                            transoral; diagnostic, including collection of                            specimen(s) by brushing or washing, when performed                            (separate procedure)                           43450, Dilation of esophagus, by unguided sound or                            bougie, single or multiple passes Diagnosis Code(s):        --- Professional ---                           K22.2, Esophageal obstruction  R13.10, Dysphagia, unspecified CPT copyright 2019 [employer]. All rights reserved. The codes documented in this report are preliminary and upon coder review may  be revised to meet current compliance requirements. Gatha Mayer, MD 08/25/2019 9:43:30 AM This report has been signed electronically. Number of Addenda: 0

## 2019-08-25 NOTE — Interval H&P Note (Signed)
History and Physical Interval Note:  08/25/2019 8:47 AM  Kaylee Hansen  has presented today for surgery, with the diagnosis of EGD with dilation.  The various methods of treatment have been discussed with the patient and family. After consideration of risks, benefits and other options for treatment, the patient has consented to  Procedure(s): ESOPHAGOGASTRODUODENOSCOPY (EGD) WITH PROPOFOL (N/A) SAVORY DILATION (N/A) as a surgical intervention.  The patient's history has been reviewed, patient examined, no change in status, stable for surgery.  I have reviewed the patient's chart and labs.  Questions were answered to the patient's satisfaction.     Silvano Rusk

## 2019-08-25 NOTE — Anesthesia Preprocedure Evaluation (Addendum)
Anesthesia Evaluation  Patient identified by MRN, date of birth, ID band Patient awake    Reviewed: Allergy & Precautions, NPO status , Patient's Chart, lab work & pertinent test results  History of Anesthesia Complications Negative for: history of anesthetic complications  Airway Mallampati: II  TM Distance: >3 FB Neck ROM: Full    Dental  (+) Edentulous Upper, Edentulous Lower   Pulmonary COPD,  COPD inhaler, Current Smoker,    Pulmonary exam normal        Cardiovascular hypertension, Pt. 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) and + Cardiac Stents (2019)  Normal cardiovascular exam  TTE 06/2019: EF 60-65%, elevated left ventricular end-diastolic pressure, impaired relaxation, mild MR, moderate to severe AR, mild to moderate AS   Neuro/Psych Seizures - (last 3 years ago), Well Controlled,  Anxiety Depression CVA (1997, wheelchair bound)    GI/Hepatic Neg liver ROS, GERD  Medicated,  Endo/Other  Hypothyroidism   Renal/GU negative Renal ROS  negative genitourinary   Musculoskeletal  (+) Arthritis ,   Abdominal   Peds  Hematology negative hematology ROS (+)   Anesthesia Other Findings Day of surgery medications reviewed with patient.  Reproductive/Obstetrics negative OB ROS                           Anesthesia Physical Anesthesia Plan  ASA: III  Anesthesia Plan: MAC   Post-op Pain Management:    Induction:   PONV Risk Score and Plan: Treatment may vary due to age or medical condition and Propofol infusion  Airway Management Planned: Natural Airway and Nasal Cannula  Additional Equipment: None  Intra-op Plan:   Post-operative Plan:   Informed Consent: I have reviewed the patients History and Physical, chart, labs and discussed the procedure including the risks, benefits and alternatives for the proposed anesthesia with the patient or authorized representative who has indicated  his/her understanding and acceptance.       Plan Discussed with: CRNA  Anesthesia Plan Comments:        Anesthesia Quick Evaluation

## 2019-08-26 ENCOUNTER — Encounter: Payer: Self-pay | Admitting: *Deleted

## 2019-08-28 ENCOUNTER — Other Ambulatory Visit: Payer: Self-pay | Admitting: Cardiology

## 2019-09-05 ENCOUNTER — Other Ambulatory Visit: Payer: Self-pay

## 2019-09-05 MED ORDER — LEVOTHYROXINE SODIUM 150 MCG PO TABS: 150.0000 ug | ORAL_TABLET | Freq: Every day | ORAL | 3 refills | Status: DC

## 2019-09-06 ENCOUNTER — Other Ambulatory Visit: Payer: Self-pay | Admitting: Internal Medicine

## 2019-09-07 MED ORDER — PAROXETINE HCL 40 MG PO TABS: 40.0000 mg | ORAL_TABLET | Freq: Every day | ORAL | 3 refills | Status: DC

## 2019-09-08 DIAGNOSIS — M25551 Pain in right hip: Secondary | ICD-10-CM | POA: Diagnosis not present

## 2019-09-12 ENCOUNTER — Encounter (HOSPITAL_COMMUNITY): Payer: Self-pay

## 2019-09-12 ENCOUNTER — Inpatient Hospital Stay (HOSPITAL_COMMUNITY)
Admission: EM | Admit: 2019-09-12 | Discharge: 2019-09-16 | DRG: 190 | Disposition: A | Discharge status: Home Health | Payer: Medicare HMO | Attending: Internal Medicine | Admitting: Internal Medicine

## 2019-09-12 ENCOUNTER — Other Ambulatory Visit: Payer: Self-pay

## 2019-09-12 ENCOUNTER — Emergency Department (HOSPITAL_COMMUNITY): Payer: Medicare HMO

## 2019-09-12 DIAGNOSIS — Z20822 Contact with and (suspected) exposure to covid-19: Secondary | ICD-10-CM | POA: Diagnosis present

## 2019-09-12 DIAGNOSIS — Z79891 Long term (current) use of opiate analgesic: Secondary | ICD-10-CM

## 2019-09-12 DIAGNOSIS — M87051 Idiopathic aseptic necrosis of right femur: Secondary | ICD-10-CM

## 2019-09-12 DIAGNOSIS — J9621 Acute and chronic respiratory failure with hypoxia: Secondary | ICD-10-CM | POA: Diagnosis not present

## 2019-09-12 DIAGNOSIS — J441 Chronic obstructive pulmonary disease with (acute) exacerbation: Secondary | ICD-10-CM | POA: Diagnosis not present

## 2019-09-12 DIAGNOSIS — R079 Chest pain, unspecified: Secondary | ICD-10-CM | POA: Diagnosis present

## 2019-09-12 DIAGNOSIS — Z85118 Personal history of other malignant neoplasm of bronchus and lung: Secondary | ICD-10-CM

## 2019-09-12 DIAGNOSIS — G4700 Insomnia, unspecified: Secondary | ICD-10-CM | POA: Diagnosis present

## 2019-09-12 DIAGNOSIS — Z880 Allergy status to penicillin: Secondary | ICD-10-CM

## 2019-09-12 DIAGNOSIS — Z7401 Bed confinement status: Secondary | ICD-10-CM

## 2019-09-12 DIAGNOSIS — I5032 Chronic diastolic (congestive) heart failure: Secondary | ICD-10-CM | POA: Diagnosis not present

## 2019-09-12 DIAGNOSIS — G8384 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postepileptic): Secondary | ICD-10-CM | POA: Diagnosis present

## 2019-09-12 DIAGNOSIS — Z96642 Presence of left artificial hip joint: Secondary | ICD-10-CM | POA: Diagnosis present

## 2019-09-12 DIAGNOSIS — I4510 Unspecified right bundle-branch block: Secondary | ICD-10-CM | POA: Diagnosis present

## 2019-09-12 DIAGNOSIS — I352 Nonrheumatic aortic (valve) stenosis with insufficiency: Secondary | ICD-10-CM | POA: Diagnosis not present

## 2019-09-12 DIAGNOSIS — R52 Pain, unspecified: Secondary | ICD-10-CM | POA: Diagnosis not present

## 2019-09-12 DIAGNOSIS — Z79899 Other long term (current) drug therapy: Secondary | ICD-10-CM

## 2019-09-12 DIAGNOSIS — I25119 Atherosclerotic heart disease of native coronary artery with unspecified angina pectoris: Secondary | ICD-10-CM | POA: Diagnosis present

## 2019-09-12 DIAGNOSIS — R0602 Shortness of breath: Secondary | ICD-10-CM | POA: Diagnosis not present

## 2019-09-12 DIAGNOSIS — Z9049 Acquired absence of other specified parts of digestive tract: Secondary | ICD-10-CM | POA: Diagnosis not present

## 2019-09-12 DIAGNOSIS — G40909 Epilepsy, unspecified, not intractable, without status epilepticus: Secondary | ICD-10-CM | POA: Diagnosis present

## 2019-09-12 DIAGNOSIS — Z8249 Family history of ischemic heart disease and other diseases of the circulatory system: Secondary | ICD-10-CM

## 2019-09-12 DIAGNOSIS — Z86010 Personal history of colonic polyps: Secondary | ICD-10-CM

## 2019-09-12 DIAGNOSIS — I110 Hypertensive heart disease with heart failure: Secondary | ICD-10-CM | POA: Diagnosis not present

## 2019-09-12 DIAGNOSIS — Z885 Allergy status to narcotic agent status: Secondary | ICD-10-CM

## 2019-09-12 DIAGNOSIS — R296 Repeated falls: Secondary | ICD-10-CM | POA: Diagnosis present

## 2019-09-12 DIAGNOSIS — F329 Major depressive disorder, single episode, unspecified: Secondary | ICD-10-CM | POA: Diagnosis present

## 2019-09-12 DIAGNOSIS — M25551 Pain in right hip: Secondary | ICD-10-CM | POA: Insufficient documentation

## 2019-09-12 DIAGNOSIS — Z8673 Personal history of transient ischemic attack (TIA), and cerebral infarction without residual deficits: Secondary | ICD-10-CM

## 2019-09-12 DIAGNOSIS — F17210 Nicotine dependence, cigarettes, uncomplicated: Secondary | ICD-10-CM | POA: Diagnosis present

## 2019-09-12 DIAGNOSIS — E785 Hyperlipidemia, unspecified: Secondary | ICD-10-CM | POA: Diagnosis not present

## 2019-09-12 DIAGNOSIS — K219 Gastro-esophageal reflux disease without esophagitis: Secondary | ICD-10-CM | POA: Diagnosis present

## 2019-09-12 DIAGNOSIS — Z993 Dependence on wheelchair: Secondary | ICD-10-CM

## 2019-09-12 DIAGNOSIS — Z955 Presence of coronary angioplasty implant and graft: Secondary | ICD-10-CM

## 2019-09-12 DIAGNOSIS — E034 Atrophy of thyroid (acquired): Secondary | ICD-10-CM | POA: Diagnosis not present

## 2019-09-12 DIAGNOSIS — Z7951 Long term (current) use of inhaled steroids: Secondary | ICD-10-CM

## 2019-09-12 DIAGNOSIS — Z9981 Dependence on supplemental oxygen: Secondary | ICD-10-CM

## 2019-09-12 DIAGNOSIS — Z90710 Acquired absence of both cervix and uterus: Secondary | ICD-10-CM

## 2019-09-12 DIAGNOSIS — F410 Panic disorder [episodic paroxysmal anxiety] without agoraphobia: Secondary | ICD-10-CM | POA: Diagnosis present

## 2019-09-12 DIAGNOSIS — M1611 Unilateral primary osteoarthritis, right hip: Secondary | ICD-10-CM | POA: Diagnosis not present

## 2019-09-12 DIAGNOSIS — M879 Osteonecrosis, unspecified: Secondary | ICD-10-CM | POA: Diagnosis present

## 2019-09-12 DIAGNOSIS — E039 Hypothyroidism, unspecified: Secondary | ICD-10-CM | POA: Diagnosis present

## 2019-09-12 DIAGNOSIS — Z91048 Other nonmedicinal substance allergy status: Secondary | ICD-10-CM

## 2019-09-12 DIAGNOSIS — M545 Low back pain: Secondary | ICD-10-CM | POA: Diagnosis not present

## 2019-09-12 DIAGNOSIS — Z888 Allergy status to other drugs, medicaments and biological substances status: Secondary | ICD-10-CM

## 2019-09-12 DIAGNOSIS — I252 Old myocardial infarction: Secondary | ICD-10-CM

## 2019-09-12 LAB — BLOOD GAS, VENOUS
Acid-Base Excess: 3.4 mmol/L — ABNORMAL HIGH (ref 0.0–2.0)
Bicarbonate: 30.3 mmol/L — ABNORMAL HIGH (ref 20.0–28.0)
FIO2: 21
O2 Saturation: 20.9 %
Patient temperature: 98.6
pCO2, Ven: 58 mmHg (ref 44.0–60.0)
pH, Ven: 7.338 (ref 7.250–7.430)
pO2, Ven: <31 mmHg — CL (ref 32.0–45.0)

## 2019-09-12 LAB — BLOOD GAS, ARTERIAL
Acid-Base Excess: 2.3 mmol/L — ABNORMAL HIGH (ref 0.0–2.0)
Bicarbonate: 28.1 mmol/L — ABNORMAL HIGH (ref 20.0–28.0)
O2 Content: 2 L/min
O2 Saturation: 95.7 %
Patient temperature: 98.6
pCO2 arterial: 48.5 mmHg — ABNORMAL HIGH (ref 32.0–48.0)
pH, Arterial: 7.381 (ref 7.350–7.450)
pO2, Arterial: 86.6 mmHg (ref 83.0–108.0)

## 2019-09-12 LAB — BASIC METABOLIC PANEL
Anion gap: 8 (ref 5–15)
BUN: 12 mg/dL (ref 8–23)
CO2: 28 mmol/L (ref 22–32)
Calcium: 9.1 mg/dL (ref 8.9–10.3)
Chloride: 102 mmol/L (ref 98–111)
Creatinine, Ser: 0.94 mg/dL (ref 0.44–1.00)
GFR calc Af Amer: >60 mL/min (ref >60)
GFR calc non Af Amer: >60 mL/min (ref >60)
Glucose, Bld: 99 mg/dL (ref 70–99)
Potassium: 5.3 mmol/L — ABNORMAL HIGH (ref 3.5–5.1)
Sodium: 138 mmol/L (ref 135–145)

## 2019-09-12 LAB — CBC WITH DIFFERENTIAL/PLATELET
Abs Immature Granulocytes: 0.02 10*3/uL (ref 0.00–0.07)
Basophils Absolute: 0.1 10*3/uL (ref 0.0–0.1)
Basophils Relative: 1 %
Eosinophils Absolute: 0.2 10*3/uL (ref 0.0–0.5)
Eosinophils Relative: 3 %
HCT: 35.9 % — ABNORMAL LOW (ref 36.0–46.0)
Hemoglobin: 11.4 g/dL — ABNORMAL LOW (ref 12.0–15.0)
Immature Granulocytes: 0 %
Lymphocytes Relative: 19 %
Lymphs Abs: 1.2 10*3/uL (ref 0.7–4.0)
MCH: 30.8 pg (ref 26.0–34.0)
MCHC: 31.8 g/dL (ref 30.0–36.0)
MCV: 97 fL (ref 80.0–100.0)
Monocytes Absolute: 0.4 10*3/uL (ref 0.1–1.0)
Monocytes Relative: 7 %
Neutro Abs: 4.4 10*3/uL (ref 1.7–7.7)
Neutrophils Relative %: 70 %
Platelets: 262 10*3/uL (ref 150–400)
RBC: 3.7 MIL/uL — ABNORMAL LOW (ref 3.87–5.11)
RDW: 14 % (ref 11.5–15.5)
WBC: 6.3 10*3/uL (ref 4.0–10.5)
nRBC: 0 % (ref 0.0–0.2)

## 2019-09-12 LAB — TROPONIN I (HIGH SENSITIVITY)
Troponin I (High Sensitivity): 3 ng/L (ref <18)
Troponin I (High Sensitivity): 4 ng/L (ref <18)

## 2019-09-12 LAB — POC SARS CORONAVIRUS 2 AG -  ED: SARS Coronavirus 2 Ag: NEGATIVE

## 2019-09-12 MED ORDER — FENTANYL CITRATE (PF) 100 MCG/2ML IJ SOLN
50.0000 ug | Freq: Once | INTRAMUSCULAR | Status: AC
  Administered 2019-09-12: 18:00:00 50 ug via INTRAVENOUS
  Filled 2019-09-12: qty 2

## 2019-09-12 MED ORDER — SODIUM CHLORIDE 0.9 % IV SOLN
500.0000 mg | Freq: Every day | INTRAVENOUS | Status: DC
  Administered 2019-09-12 – 2019-09-14 (×3): 500 mg via INTRAVENOUS
  Filled 2019-09-12 (×4): qty 500

## 2019-09-12 MED ORDER — METHYLPREDNISOLONE SODIUM SUCC 125 MG IJ SOLR
60.0000 mg | Freq: Four times a day (QID) | INTRAMUSCULAR | Status: DC
  Administered 2019-09-12 – 2019-09-14 (×6): 60 mg via INTRAVENOUS
  Filled 2019-09-12 (×6): qty 2

## 2019-09-12 MED ORDER — OXYCODONE-ACETAMINOPHEN 5-325 MG PO TABS
1.0000 | ORAL_TABLET | Freq: Once | ORAL | Status: AC
  Administered 2019-09-12: 1 via ORAL
  Filled 2019-09-12: qty 1

## 2019-09-12 MED ORDER — ALPRAZOLAM 0.25 MG PO TABS
0.2500 mg | ORAL_TABLET | Freq: Every evening | ORAL | Status: DC | PRN
  Administered 2019-09-12: 23:00:00 0.25 mg via ORAL
  Filled 2019-09-12: qty 1

## 2019-09-12 MED ORDER — LIDOCAINE 5 % EX PTCH
1.0000 | MEDICATED_PATCH | Freq: Every day | CUTANEOUS | Status: DC
  Administered 2019-09-13 – 2019-09-15 (×4): 1 via TRANSDERMAL
  Filled 2019-09-12 (×4): qty 1

## 2019-09-12 MED ORDER — HYDROMORPHONE HCL 1 MG/ML IJ SOLN
0.5000 mg | Freq: Once | INTRAMUSCULAR | Status: AC
  Administered 2019-09-12: 20:00:00 0.5 mg via INTRAVENOUS
  Filled 2019-09-12: qty 1

## 2019-09-12 MED ORDER — ALBUTEROL SULFATE (2.5 MG/3ML) 0.083% IN NEBU
3.0000 mL | INHALATION_SOLUTION | Freq: Four times a day (QID) | RESPIRATORY_TRACT | Status: DC | PRN
  Administered 2019-09-15 (×2): 3 mL via RESPIRATORY_TRACT
  Filled 2019-09-12 (×2): qty 3

## 2019-09-12 MED ORDER — ACETAMINOPHEN 325 MG PO TABS: 650.0000 mg | ORAL_TABLET | Freq: Four times a day (QID) | ORAL | Status: DC | PRN

## 2019-09-12 MED ORDER — HYDROCODONE-ACETAMINOPHEN 5-325 MG PO TABS
1.0000 | ORAL_TABLET | ORAL | Status: DC | PRN
  Administered 2019-09-12 – 2019-09-16 (×8): 1 via ORAL
  Filled 2019-09-12 (×8): qty 1

## 2019-09-12 MED ORDER — ALBUTEROL SULFATE HFA 108 (90 BASE) MCG/ACT IN AERS
6.0000 | INHALATION_SPRAY | Freq: Once | RESPIRATORY_TRACT | Status: AC
  Administered 2019-09-12: 16:00:00 6 via RESPIRATORY_TRACT
  Filled 2019-09-12: qty 6.7

## 2019-09-12 MED ORDER — ONDANSETRON HCL 4 MG/2ML IJ SOLN: 4.0000 mg | Freq: Four times a day (QID) | INTRAMUSCULAR | Status: DC | PRN

## 2019-09-12 MED ORDER — MOMETASONE FURO-FORMOTEROL FUM 200-5 MCG/ACT IN AERO
2.0000 | INHALATION_SPRAY | Freq: Two times a day (BID) | RESPIRATORY_TRACT | Status: DC
  Administered 2019-09-13 – 2019-09-16 (×7): 2 via RESPIRATORY_TRACT
  Filled 2019-09-12: qty 8.8

## 2019-09-12 MED ORDER — HYDROMORPHONE HCL 1 MG/ML IJ SOLN
0.5000 mg | INTRAMUSCULAR | Status: DC | PRN
  Administered 2019-09-13 (×3): 0.5 mg via INTRAVENOUS
  Filled 2019-09-12 (×3): qty 0.5

## 2019-09-12 MED ORDER — IPRATROPIUM-ALBUTEROL 0.5-2.5 (3) MG/3ML IN SOLN
3.0000 mL | Freq: Three times a day (TID) | RESPIRATORY_TRACT | Status: DC | PRN
  Administered 2019-09-12 – 2019-09-13 (×2): 3 mL via RESPIRATORY_TRACT
  Filled 2019-09-12 (×2): qty 3

## 2019-09-12 NOTE — ED Notes (Signed)
Pt lying in bed awake. Full monitor on. Pt asking for pain meds.

## 2019-09-12 NOTE — ED Notes (Signed)
Pt lying in bed. Linen changed with tech. Pt repositioned. Full monitor on. Will continue to monitor pt.

## 2019-09-12 NOTE — ED Triage Notes (Signed)
Patient c/o right hip pain x 5 days. Patient denies any injury. Patient reports that she wear O2 2.5L/min via [place] at home at night and prn.  Patient states she became increasingly SOB because she had a mask on at the Orthopedic office today. Sats 98% on room air, but patient states audible wheezing is normal for her. Patient states she was told to come to the ED for x-rays.

## 2019-09-12 NOTE — ED Notes (Signed)
Patient being transported to MRI at this time.
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
## 2019-09-12 NOTE — ED Provider Notes (Signed)
  Physical Exam  BP 113/75   Pulse 93   Temp (!) 97.5 F (36.4 C) (Oral)   Resp (!) 21   Ht 5\' 5"  (1.651 m)   Wt 74.4 kg   SpO2 95%   BMI 27.29 kg/m   Physical Exam  ED Course/Procedures   Clinical Course as of Sep 12 2014  Fri Sep 12, 2019  1715 Patient was at MRI and was halfway through her scan when she began complaining of chest pain.  She was aborted from the scan and brought back to the Ed.  She describes to me "it feels like an elephant's sitting on my chest" and radiation down her left shoulder.  Her husband says she "gets like this with anxiety lying in that machine," but the patient denies anxiety.  She does have a significant cardiovascular history.  We're checking an ecg now, will add troponin.   [MT]  1725 Patient signed out to Dr Alvino Chapel with plan to follow up on ecg and cardiac enzymes, as well as MR images   [MT]    Clinical Course User Index [MT] Trifan, Carola Rhine, MD    Procedures  MDM  Received patient in signout.  Right hip pain and difficulty breathing.  History of COPD.  Trouble breathing appears.  COPD.  Still wheezing however after treatments.  Still somewhat dyspneic with talking.  Negative troponin x-ray reassuring, MRI done but developed chest pain that was only only able to get some of the images.  Has been preliminary read is negative but states that not have everything they need.  Patient continued pain unrelieved with oral medicines.  With continued pain and shortness of breath will discuss with hospitalist about admission.  May be able to get further MRI imaging while in the hospital      Davonna Belling, MD 09/12/19 2016

## 2019-09-12 NOTE — H&P (Signed)
History and Physical    Kaylee Hansen OQH:[id number] DOB: May 29, 1956 DOA: 09/12/2019  PCP: Cassandria Anger, MD   Patient coming from: Home   Chief Complaint: Right hip pain, increased SOB and cough   HPI: Kaylee Hansen is a 64 y.o. female with medical history significant for coronary artery disease, chronic diastolic CHF, anxiety, COPD with chronic hypoxic respiratory failure, hypothyroidism, and chronic right hip pain, now presenting to the emergency department with uncontrolled right hip pain, increased shortness of breath, and increased cough.  Patient reports that her right hip has been hurting much more than usual for roughly a week now, unable to control the pain with her narcotic analgesics at home, reportedly saw her orthopedist in the clinic today with no acute findings on plain films, and was directed to the ED for pain control and further evaluation.  Patient was found to be dyspneic and acknowledges that her chronic shortness of breath and cough have been worse the past few days.  She denies fevers, chills, leg swelling or tenderness, or recent fall or trauma.  Her husband assist with her ADLs and she uses a wheelchair at baseline.  ED Course: Upon arrival to the ED, patient is found to be afebrile, saturating low 90s on 2 L/min of supplemental oxygen, tachypneic in the mid 20s, and with stable blood pressure.  EKG features sinus rhythm with RBBB.  MRI right hip was performed and the patient developed acute onset of chest pain while in the scanner, resolved when she was taken out of the machine.  Husband reports that she has had similar episodes with MRI that were attributed to panic.  Chemistry panel with potassium 5.3 and CBC with mild normocytic anemia.  VBG demonstrates hypoxia and Covid antigen test was negative.  High-sensitivity troponin was normal x2.  Patient was treated with multiple doses of IV analgesics in the ED and hospitalist consulted for admission.  Review of  Systems:  All other systems reviewed and apart from HPI, are negative.  Past Medical History:  Diagnosis Date  . Acute respiratory failure following trauma and surgery ([place]) 08/26/2012  . Anxiety   . Aortic insufficiency with aortic stenosis 04/2017   TTE December 2019: Mild AS with severe regurgitation.  Mild LA dilation.;;  TEE January 2016: Severe-type III aortic regurgitation (holodiastolic flow reversal in the a sending aorta & vena contracta =6.   . CAD S/P percutaneous coronary angioplasty 11/2017   Proximal RCA PCI Synergy DES 3.5 mm x 16 mm (3.8 mm). Ost-mLM 30%. Ost-prox Cx 40%.  . Collagenous colitis   . Colon adenomas 2011  . Constipation    Chronic abdominal pain and constipation  . COPD (chronic obstructive pulmonary disease) ([place])   . Depression   . Diverticulosis   . Esophageal stricture 11/08/2012   Ulcer noted in 2010  . GERD (gastroesophageal reflux disease)   . Helicobacter pylori gastritis 2010   Pylera Tx  . History of cholecystectomy   . Hx of appendectomy   . Hx of cancer of lung 1999  . Hx of hysterectomy   . Hyperlipidemia   . Hypothyroidism   . Low back pain   . Non-STEMI (non-ST elevated myocardial infarction) ([place]) 11/2017   RCA PCI  . Osteoarthritis of knee    bilateral knee  . Seizures ([place])   . Stroke [facility])    CVA, hx of 97  . 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  ABDOMINAL HYSTERECTOMY     complete 1992  . ABDOMINAL SURGERY     Exploratory  . APPENDECTOMY    . BALLOON DILATION N/A 11/08/2012   Procedure: BALLOON DILATION;  Surgeon: Gatha Mayer, MD;  Location: [facility] ENDOSCOPY;  Service: Endoscopy;  Laterality: N/A;  . CHOLECYSTECTOMY    . COLONOSCOPY W/ BIOPSIES     multiple  . CORONARY STENT INTERVENTION N/A 06/28/2018   Procedure: CORONARY STENT INTERVENTION;  Surgeon: Martinique, Peter M, MD;  Location: [place] CV LAB;  Service: Cardiovascular;  Laterality: N/A;  90% prox RCA -> PCI with  Synergy DES 3.5 mm x 16 mm (3.8 mm).  . CT CTA CORONARY W/CA SCORE W/CM &/OR WO/CM  05/2017   Coronary calcium score 2.9. Intermediate risk. Unusual noncalcified plaque in RCA --FFR negative  . ESOPHAGOGASTRODUODENOSCOPY     w/baloon x 2  . ESOPHAGOGASTRODUODENOSCOPY N/A 11/08/2012   Procedure: ESOPHAGOGASTRODUODENOSCOPY (EGD);  Surgeon: Gatha Mayer, MD;  Location: [facility];  Service: Endoscopy;  Laterality: N/A;  . ESOPHAGOGASTRODUODENOSCOPY (EGD) WITH PROPOFOL N/A 08/25/2019   Procedure: ESOPHAGOGASTRODUODENOSCOPY (EGD) WITH PROPOFOL;  Surgeon: Gatha Mayer, MD;  Location: [facility] ENDOSCOPY;  Service: Endoscopy;  Laterality: N/A;  . LEFT HEART CATH AND CORONARY ANGIOGRAPHY N/A 06/28/2018   Procedure: LEFT HEART CATH AND CORONARY ANGIOGRAPHY;  Surgeon: Martinique, Peter M, MD;  Location: [place] CV LAB;  Service: Cardiovascular:  90% prox RCA -> DES PCI. Ost-mLM 30%. Ost-prox Cx 40%.   EF 55-65%. LVEDP 15 mmHg.   [name] MALONEY DILATION  08/25/2019   Procedure: MALONEY DILATION;  Surgeon: Gatha Mayer, MD;  Location: [facility];  Service: Endoscopy;;  . RIGHT HEART CATH N/A 07/11/2018   Procedure: RIGHT HEART CATH;  Surgeon: Leonie Man, MD;  Location: [place] CV LAB;;   Relatively normal Right Heart Cath pressures: PA pressure 26/14 mmHg-mean 20 mmHg; PCWP 13 mmHg.  RAP 6 mmHg, RVP 28/3 mmHg-EDP 10 mmHg. Severely reduced cardiac output and index by Fick: 3.63 and 2.07.  . TEE WITHOUT CARDIOVERSION N/A 07/11/2018   Procedure: TRANSESOPHAGEAL ECHOCARDIOGRAM (TEE);  Surgeon: [name], MD;  Location: [facility] ENDOSCOPY;  Service: Cardiology;;  Severe aortic regurgitation-type III. (suggested by holodiastolic flow reversal and descending aorta-vena contracta 6 mm).  . TOTAL HIP ARTHROPLASTY     Left  . TRANSTHORACIC ECHOCARDIOGRAM  05/2018   EF 60-65%.  No R WMA.  GR 1 DD.  Mild aortic stenosis with severe regurgitation.  Mild MR..  Only mild LV dilation noted.  . TRANSTHORACIC  ECHOCARDIOGRAM  12/2018    EF 60-65%. Mild LVH. Gr1 DD. Mod AoV thickening. Mod-Severe AI, ~ mlid AS.     reports that she has been smoking cigarettes. She has a 48.00 pack-year smoking history. She has never used smokeless tobacco. She reports that she does not drink alcohol or use drugs.  Allergies  Allergen Reactions  . Morphine     "Makes me go crazy."   . Nitrofuran Derivatives     ??confusion  . Oxycodone Hcl Other (See Comments)    Knocked her out for 6 days  . Penicillins Hives    Has patient had a PCN reaction causing immediate rash, facial/tongue/throat swelling, SOB or lightheadedness with hypotension: unknown Has patient had a PCN reaction causing severe rash involving mucus membranes or skin necrosis: unknown Has patient had a PCN reaction that required hospitalization No Has patient had a PCN reaction occurring within the last 10 years: No If all of the  above answers are "NO", then may proceed with Cephalosporin use.   . Tape Other (See Comments)    Skin turns red and burns    Family History  Problem Relation Age of Onset  . Coronary artery disease Mother   . Diabetes Mother   . Hypertension Father   . Diabetes Son   . Coronary artery disease Other        grandmother, grandfather  . Kidney disease Other        aunt  . Kidney cancer Sister   . Colon cancer Neg Hx        colon     Prior to Admission medications   Medication Sig Start Date End Date Taking? Authorizing Provider  albuterol (PROVENTIL HFA;VENTOLIN HFA) 108 (90 Base) MCG/ACT inhaler Inhale 2 puffs into the lungs every 6 (six) hours as needed for wheezing or shortness of breath. 09/16/18 09/16/19 Yes Plotnikov, [name], MD  albuterol (PROVENTIL) (2.5 MG/3ML) 0.083% nebulizer solution Take 3 mLs (2.5 mg total) by nebulization every 4 (four) hours as needed for wheezing or shortness of breath. 09/16/18  Yes Plotnikov, [name], MD  ALPRAZolam (XANAX) 0.25 MG tablet TAKE 1 TABLET (0.25 MG TOTAL) BY  MOUTH AT BEDTIME AS NEEDED FOR ANXIETY. 06/17/19  Yes Plotnikov, [name], MD  bisacodyl (DULCOLAX) 5 MG EC tablet Take 2 tablets (10 mg total) by mouth at bedtime. Patient taking differently: Take 10 mg by mouth daily as needed for moderate constipation.  10/24/18  Yes Gatha Mayer, MD  budesonide-formoterol [facility]) 160-4.5 MCG/ACT inhaler Inhale 2 puffs into the lungs 2 (two) times daily at 10 AM and 5 PM. Rinse mouth after each use 06/14/17  Yes Tanda Rockers, MD  clopidogrel (PLAVIX) 75 MG tablet TAKE 1 TABLET (75 MG TOTAL) BY MOUTH DAILY WITH BREAKFAST. Patient taking differently: Take 75 mg by mouth daily.  06/16/19  Yes Kilroy, Luke K, PA-C  diltiazem (CARDIZEM CD) 120 MG 24 hr capsule TAKE 1 CAPSULE BY MOUTH AT BEDTIME. Patient taking differently: Take 120 mg by mouth daily.  06/10/19  Yes Leonie Man, MD  diphenhydrAMINE HCl, Sleep, (ZZZQUIL) 25 MG CAPS Take 50 mg by mouth at bedtime as needed (sleep).   Yes [provider]  furosemide (LASIX) 20 MG tablet Take 1 tablet by mouth once daily Patient taking differently: Take 20 mg by mouth daily.  04/21/19  Yes Leonie Man, MD  HYDROcodone-acetaminophen [facility]) 10-325 MG tablet Take 1 tablet by mouth every 6 (six) hours as needed for severe pain. Please fill on or after 07/18/19 07/03/19  Yes Plotnikov, [name], MD  hydroxypropyl methylcellulose / hypromellose (ISOPTO TEARS / GONIOVISC) 2.5 % ophthalmic solution Place 1 drop into both eyes 3 (three) times daily as needed for dry eyes.   Yes [provider]  isosorbide mononitrate (IMDUR) 30 MG 24 hr tablet Take 1 tablet by mouth once daily Patient taking differently: Take 30 mg by mouth daily.  07/21/19  Yes Leonie Man, MD  ketoconazole (NIZORAL) 2 % cream Apply 1 application topically daily. 04/03/19 04/02/20 Yes Plotnikov, [name], MD  levothyroxine (SYNTHROID) 150 MCG tablet Take 1 tablet (150 mcg total) by mouth daily. 09/05/19  Yes Plotnikov, [name], MD  MELATONIN PO Take 1 tablet by mouth at bedtime as needed (sleep).   Yes [provider]  nitroGLYCERIN (NITROSTAT) 0.4 MG SL tablet DISSOLVE ONE TABLET UNDER THE TONGUE EVERY 5 MINUTES AS NEEDED FOR CHEST PAIN.  DO  NOT EXCEED A TOTAL OF 3 DOSES IN 15 MINUTES Patient taking differently: Place 0.4 mg under the tongue every 5 (five) minutes as needed for chest pain.  06/28/18  Yes Leonie Man, MD  pantoprazole (PROTONIX) 40 MG tablet TAKE 1 TABLET BY MOUTH DAILY BEFORE BREAKFAST Patient taking differently: Take 40 mg by mouth daily.  09/08/19  Yes Gatha Mayer, MD  PARoxetine (PAXIL) 40 MG tablet Take 1 tablet (40 mg total) by mouth daily. 09/07/19  Yes Plotnikov, [name], MD  phenytoin (DILANTIN) 100 MG ER capsule TAKE 100 MG EVERY MORNING AND 200 MG EVERY NIGHT. Patient taking differently: Take 100-200 mg by mouth See admin instructions. TAKE 100 MG EVERY MORNING AND 200 MG EVERY NIGHT. 02/12/19  Yes Plotnikov, [name], MD  promethazine (PHENERGAN) 25 MG tablet Take 1 tablet (25 mg total) by mouth every 8 (eight) hours as needed for nausea or vomiting. 10/15/17  Yes Plotnikov, [name], MD  rosuvastatin (CRESTOR) 40 MG tablet TAKE 1 TABLET (40 MG TOTAL) BY MOUTH DAILY. DISCONTINUE 20 MG DOSE Patient taking differently: Take 40 mg by mouth daily.  08/28/19 11/26/19 Yes Leonie Man, MD  SUMAtriptan (IMITREX) 100 MG tablet TAKE 1 TABLET BY MOUTH EVERY 2 HOURS AS NEEDED FOR MIAGRAINE/HEADACHE Patient taking differently: Take 100 mg by mouth every 2 (two) hours as needed for migraine or headache.  03/06/19  Yes Plotnikov, [name], MD  traZODone (DESYREL) 100 MG tablet TAKE 1 TABLET BY MOUTH EVERYDAY AT BEDTIME Patient taking differently: Take 100 mg by mouth at bedtime as needed for sleep.  04/22/19  Yes Plotnikov, [name], MD  Vitamin D, Ergocalciferol, (DRISDOL) 1.25 MG (50000 UT) CAPS capsule TAKE ONE CAPSULE BY MOUTH ONE TIME PER WEEK Patient taking differently: Take 50,000  Units by mouth every Monday.  03/25/19  Yes Plotnikov, [name], MD  acetaminophen (TYLENOL) 325 MG tablet Take 2 tablets (650 mg total) by mouth every 4 (four) hours as needed for headache or mild pain. Patient not taking: Reported on 09/12/2019 06/29/18   Erlene Quan, PA-C  guaiFENesin (MUCINEX) 600 MG 12 hr tablet Take 1 tablet (600 mg total) by mouth 2 (two) times daily as needed for cough or to loosen phlegm. Patient not taking: Reported on 09/12/2019 04/24/17   [name], Charlene Brooke, NP  OXYGEN Inhale 2.5 L/min into the lungs at bedtime.    [provider]  Respiratory Therapy Supplies (FLUTTER) DEVI As directed 05/02/17   Tanda Rockers, MD  senna-docusate (SENOKOT S) 8.6-50 MG tablet Take 2 tablets by mouth at bedtime. Patient not taking: Reported on 09/12/2019 10/24/18   Gatha Mayer, MD  calcitRIOL (ROCALTROL) 0.25 MCG capsule Take 1 capsule (0.25 mcg total) by mouth daily. 07/14/15 07/03/18  Plotnikov, [name], MD    Physical Exam: Vitals:   09/12/19 2030 09/12/19 2054 09/12/19 2100 09/12/19 2200  BP: (!) 126/53  122/60 (!) 123/51  Pulse: 94  93 92  Resp: (!) 21  19 19   Temp:  98.2 F (36.8 C)    TempSrc:  Oral    SpO2: 99%  99% 97%  Weight:      Height:        Constitutional: NAD, calm  Eyes: PERTLA, lids and conjunctivae normal ENMT: Mucous membranes are moist. Posterior pharynx clear of any exudate or lesions.   Neck: normal, supple, no masses, no thyromegaly Respiratory: Diminished bilaterally, diffuse wheezing. Tachypnea. No pallor or cyanosis.    Cardiovascular: S1 &  S2 heard, regular rate and rhythm. No extremity edema.   Abdomen: No distension, no tenderness, soft. Bowel sounds active.  Musculoskeletal: no clubbing / cyanosis. Pain with right hip PROM.   Skin: no significant rashes, lesions, ulcers. Warm, dry, well-perfused. Neurologic: No facial asymmetry. Sensation intact. Moving all extremities.  Psychiatric: Alert and oriented to person, place, and  situation. Calm, cooperative.    Labs and Imaging on Admission: I have personally reviewed following labs and imaging studies  CBC: Recent Labs  Lab 09/12/19 1631  WBC 6.3  NEUTROABS 4.4  HGB 11.4*  HCT 35.9*  MCV 97.0  PLT 237   Basic Metabolic Panel: Recent Labs  Lab 09/12/19 1631  NA 138  K 5.3*  CL 102  CO2 28  GLUCOSE 99  BUN 12  CREATININE 0.94  CALCIUM 9.1   GFR: Estimated Creatinine Clearance: 60.3 mL/min (by C-G formula based on SCr of 0.94 mg/dL). Liver Function Tests: No results for input(s): AST, ALT, ALKPHOS, BILITOT, PROT, ALBUMIN in the last 168 hours. No results for input(s): LIPASE, AMYLASE in the last 168 hours. No results for input(s): AMMONIA in the last 168 hours. Coagulation Profile: No results for input(s): INR, PROTIME in the last 168 hours. Cardiac Enzymes: No results for input(s): CKTOTAL, CKMB, CKMBINDEX, TROPONINI in the last 168 hours. BNP (last 3 results) No results for input(s): PROBNP in the last 8760 hours. HbA1C: No results for input(s): HGBA1C in the last 72 hours. CBG: No results for input(s): GLUCAP in the last 168 hours. Lipid Profile: No results for input(s): CHOL, HDL, LDLCALC, TRIG, CHOLHDL, LDLDIRECT in the last 72 hours. Thyroid Function Tests: No results for input(s): TSH, T4TOTAL, FREET4, T3FREE, THYROIDAB in the last 72 hours. Anemia Panel: No results for input(s): VITAMINB12, FOLATE, FERRITIN, TIBC, IRON, RETICCTPCT in the last 72 hours. Urine analysis:    Component Value Date/Time   COLORURINE YELLOW 01/11/2017 0929   APPEARANCEUR CLEAR 01/11/2017 0929   LABSPEC 1.025 01/11/2017 0929   PHURINE 6.0 01/11/2017 0929   GLUCOSEU NEGATIVE 01/11/2017 0929   HGBUR NEGATIVE 01/11/2017 0929   BILIRUBINUR 1 08/01/2017 0906   KETONESUR NEGATIVE 01/11/2017 0929   PROTEINUR 2 08/01/2017 0906   PROTEINUR NEGATIVE 05/11/2009 1352   UROBILINOGEN 0.2 08/01/2017 0906   UROBILINOGEN 0.2 01/11/2017 0929   NITRITE pos  08/01/2017 0906   NITRITE NEGATIVE 01/11/2017 0929   LEUKOCYTESUR Moderate (2+) (A) 08/01/2017 0906   Sepsis Labs: @LABRCNTIP (procalcitonin:4,lacticidven:4) )No results found for this or any previous visit (from the past 240 hour(s)).   Radiological Exams on Admission: DG Chest 2 View  Result Date: 09/12/2019 CLINICAL DATA:  Shortness of breath EXAM: CHEST - 2 VIEW COMPARISON:  04/21/2019, 10/03/2016 FINDINGS: No acute opacity or pleural effusion. Stable cardiomediastinal silhouette. No pneumothorax. Old bilateral rib fractures. IMPRESSION: No active cardiopulmonary disease. Electronically Signed   By: Donavan Foil M.D.   On: 09/12/2019 15:26   DG Hip Unilat  With Pelvis 2-3 Views Right  Result Date: 09/12/2019 CLINICAL DATA:  Right-sided hip pain EXAM: DG HIP (WITH OR WITHOUT PELVIS) 2-3V RIGHT COMPARISON:  06/14/2015 FINDINGS: Status post left hip replacement with intact hardware and normal alignment. Right hip shows no fracture or malalignment. Mild degenerative change of the right hip. IMPRESSION: Mild degenerative change of the right hip. No acute osseous abnormality. Status post left hip replacement with normal alignment Electronically Signed   By: Donavan Foil M.D.   On: 09/12/2019 15:25    EKG: Independently reviewed. Sinus rhythm, RBBB.  Assessment/Plan   1. COPD exacerbation; acute on chronic hypoxic respiratory failure  - Presents with uncontrolled right hip pain and increased SOB and cough, found to be hypoxic with clear CXR, diffuse wheezes, improved with SABA in ED but continues to be dyspneic at rest with wheezing  - Check sputum culture, start systemic steroid and azithromycin, continue ICS/LABA and SABA, continue supplemental O2    2. Intractable right hip pain  - Presents from ortho clinic with 1 week of uncontrolled right hip pain, atraumatic  - Radiographs with degenerative changes but no acute osseous abnormality  - MRI in ED with no acute findings on prelim read  but [facility] radiologist will read formally on 3/20  - Continue pain-control, convert to oral medications as tolerated    3. Chest pain; CAD  - Patient developed acute chest pain while undergoing MRI hip, resolved when taken out of the scanner  - Her husband suspects this was panic attack likely she has had previously with MRI  - HS troponin was normal x2  - Continue cardiac monitoring for now, continue statin and Plavix, repeat EKG    4. Hypothyroidism  - Continue Synthroid    5. Anxiety  - Continue Paxil and and low-dose Xanax as needed    6. Chronic diastolic CHF  - Appears compensated, continue Lasix     DVT prophylaxis: Lovenox  Code Status: Full  Family Communication: Discussed with patient  Disposition Plan: Likely back home pending improvement in respiratory status and pain-control on oral medications  Consults called: None  Admission status: Inpatient     Vianne Bulls, MD [facility] Pager: See [email or web address]  If 7AM-7PM, please contact the daytime attending [email or web address]  09/12/2019, 10:57 PM

## 2019-09-12 NOTE — ED Notes (Signed)
Pt sitting up in bed. NAD noted. Pt denies any needs. Will continue to monitor.

## 2019-09-12 NOTE — ED Notes (Signed)
Pt called out stating her purewick wasn't working and that she might need to have a BM. Pt placed on bedpan. Will check back in a few minutes.

## 2019-09-12 NOTE — ED Notes (Signed)
Date and time results received: 09/12/19 5:09 PM  Test: PO2 Critical Value: less than 31  Name of Provider Notified: Dr Langston Masker

## 2019-09-13 LAB — CBC WITH DIFFERENTIAL/PLATELET
Abs Immature Granulocytes: 0.03 10*3/uL (ref 0.00–0.07)
Basophils Absolute: 0 10*3/uL (ref 0.0–0.1)
Basophils Relative: 1 %
Eosinophils Absolute: 0 10*3/uL (ref 0.0–0.5)
Eosinophils Relative: 0 %
HCT: 34 % — ABNORMAL LOW (ref 36.0–46.0)
Hemoglobin: 10.9 g/dL — ABNORMAL LOW (ref 12.0–15.0)
Immature Granulocytes: 0 %
Lymphocytes Relative: 8 %
Lymphs Abs: 0.5 10*3/uL — ABNORMAL LOW (ref 0.7–4.0)
MCH: 30.7 pg (ref 26.0–34.0)
MCHC: 32.1 g/dL (ref 30.0–36.0)
MCV: 95.8 fL (ref 80.0–100.0)
Monocytes Absolute: 0.1 10*3/uL (ref 0.1–1.0)
Monocytes Relative: 1 %
Neutro Abs: 6.2 10*3/uL (ref 1.7–7.7)
Neutrophils Relative %: 90 %
Platelets: 250 10*3/uL (ref 150–400)
RBC: 3.55 MIL/uL — ABNORMAL LOW (ref 3.87–5.11)
RDW: 13.9 % (ref 11.5–15.5)
WBC: 6.9 10*3/uL (ref 4.0–10.5)
nRBC: 0 % (ref 0.0–0.2)

## 2019-09-13 LAB — SARS CORONAVIRUS 2 (TAT 6-24 HRS): SARS Coronavirus 2: NEGATIVE

## 2019-09-13 LAB — BASIC METABOLIC PANEL
Anion gap: 10 (ref 5–15)
BUN: 12 mg/dL (ref 8–23)
CO2: 25 mmol/L (ref 22–32)
Calcium: 9 mg/dL (ref 8.9–10.3)
Chloride: 101 mmol/L (ref 98–111)
Creatinine, Ser: 0.78 mg/dL (ref 0.44–1.00)
GFR calc Af Amer: >60 mL/min (ref >60)
GFR calc non Af Amer: >60 mL/min (ref >60)
Glucose, Bld: 123 mg/dL — ABNORMAL HIGH (ref 70–99)
Potassium: 4.9 mmol/L (ref 3.5–5.1)
Sodium: 136 mmol/L (ref 135–145)

## 2019-09-13 LAB — HIV ANTIBODY (ROUTINE TESTING W REFLEX): HIV Screen 4th Generation wRfx: NONREACTIVE

## 2019-09-13 MED ORDER — CYCLOBENZAPRINE HCL 5 MG PO TABS
5.0000 mg | ORAL_TABLET | Freq: Three times a day (TID) | ORAL | Status: DC
  Administered 2019-09-13 – 2019-09-16 (×10): 5 mg via ORAL
  Filled 2019-09-13 (×10): qty 1

## 2019-09-13 MED ORDER — BISACODYL 5 MG PO TBEC: 5.0000 mg | DELAYED_RELEASE_TABLET | Freq: Every day | ORAL | Status: DC | PRN

## 2019-09-13 MED ORDER — FUROSEMIDE 20 MG PO TABS
20.0000 mg | ORAL_TABLET | Freq: Every day | ORAL | Status: DC
  Administered 2019-09-13 – 2019-09-16 (×4): 20 mg via ORAL
  Filled 2019-09-13 (×4): qty 1

## 2019-09-13 MED ORDER — SODIUM CHLORIDE 0.9% FLUSH
3.0000 mL | Freq: Two times a day (BID) | INTRAVENOUS | Status: DC
  Administered 2019-09-13 – 2019-09-15 (×3): 3 mL via INTRAVENOUS

## 2019-09-13 MED ORDER — SODIUM CHLORIDE 0.9% FLUSH: 3.0000 mL | INTRAVENOUS | Status: DC | PRN

## 2019-09-13 MED ORDER — LEVOTHYROXINE SODIUM 75 MCG PO TABS
150.0000 ug | ORAL_TABLET | Freq: Every day | ORAL | Status: DC
  Administered 2019-09-13 – 2019-09-16 (×4): 150 ug via ORAL
  Filled 2019-09-13 (×4): qty 2

## 2019-09-13 MED ORDER — ENOXAPARIN SODIUM 40 MG/0.4ML ~~LOC~~ SOLN
40.0000 mg | Freq: Every day | SUBCUTANEOUS | Status: DC
  Administered 2019-09-13 – 2019-09-16 (×4): 40 mg via SUBCUTANEOUS
  Filled 2019-09-13 (×3): qty 0.4

## 2019-09-13 MED ORDER — PHENYTOIN SODIUM EXTENDED 100 MG PO CAPS
100.0000 mg | ORAL_CAPSULE | Freq: Every day | ORAL | Status: DC
  Administered 2019-09-13 – 2019-09-16 (×4): 100 mg via ORAL
  Filled 2019-09-13 (×4): qty 1

## 2019-09-13 MED ORDER — SODIUM CHLORIDE 0.9 % IV SOLN
250.0000 mL | INTRAVENOUS | Status: DC | PRN
  Administered 2019-09-13: 01:00:00 250 mL via INTRAVENOUS

## 2019-09-13 MED ORDER — DILTIAZEM HCL ER COATED BEADS 120 MG PO CP24
120.0000 mg | ORAL_CAPSULE | Freq: Every day | ORAL | Status: DC
  Administered 2019-09-13 – 2019-09-16 (×4): 120 mg via ORAL
  Filled 2019-09-13 (×4): qty 1

## 2019-09-13 MED ORDER — PROMETHAZINE HCL 25 MG PO TABS: 12.5000 mg | ORAL_TABLET | Freq: Four times a day (QID) | ORAL | Status: DC | PRN

## 2019-09-13 MED ORDER — PANTOPRAZOLE SODIUM 40 MG PO TBEC
40.0000 mg | DELAYED_RELEASE_TABLET | Freq: Every day | ORAL | Status: DC
  Administered 2019-09-13 – 2019-09-16 (×4): 40 mg via ORAL
  Filled 2019-09-13 (×4): qty 1

## 2019-09-13 MED ORDER — SODIUM CHLORIDE 0.9% FLUSH
3.0000 mL | Freq: Two times a day (BID) | INTRAVENOUS | Status: DC
  Administered 2019-09-13 – 2019-09-15 (×7): 3 mL via INTRAVENOUS

## 2019-09-13 MED ORDER — HYDROMORPHONE HCL 1 MG/ML IJ SOLN
1.0000 mg | INTRAMUSCULAR | Status: DC | PRN
  Administered 2019-09-13 – 2019-09-15 (×5): 1 mg via INTRAVENOUS
  Filled 2019-09-13 (×5): qty 1

## 2019-09-13 MED ORDER — IPRATROPIUM-ALBUTEROL 0.5-2.5 (3) MG/3ML IN SOLN
3.0000 mL | Freq: Three times a day (TID) | RESPIRATORY_TRACT | Status: DC
  Administered 2019-09-13 – 2019-09-16 (×9): 3 mL via RESPIRATORY_TRACT
  Filled 2019-09-13 (×9): qty 3

## 2019-09-13 MED ORDER — ISOSORBIDE MONONITRATE ER 30 MG PO TB24
30.0000 mg | ORAL_TABLET | Freq: Every day | ORAL | Status: DC
  Administered 2019-09-13 – 2019-09-16 (×4): 30 mg via ORAL
  Filled 2019-09-13 (×4): qty 1

## 2019-09-13 MED ORDER — OXYCODONE HCL ER 10 MG PO T12A
10.0000 mg | EXTENDED_RELEASE_TABLET | Freq: Two times a day (BID) | ORAL | Status: DC
  Administered 2019-09-13 – 2019-09-16 (×6): 10 mg via ORAL
  Filled 2019-09-13 (×6): qty 1

## 2019-09-13 MED ORDER — CLOPIDOGREL BISULFATE 75 MG PO TABS
75.0000 mg | ORAL_TABLET | Freq: Every day | ORAL | Status: DC
  Administered 2019-09-13 – 2019-09-16 (×4): 75 mg via ORAL
  Filled 2019-09-13 (×4): qty 1

## 2019-09-13 MED ORDER — PHENYTOIN SODIUM EXTENDED 100 MG PO CAPS
200.0000 mg | ORAL_CAPSULE | Freq: Every day | ORAL | Status: DC
  Administered 2019-09-13 – 2019-09-15 (×4): 200 mg via ORAL
  Filled 2019-09-13 (×4): qty 2

## 2019-09-13 MED ORDER — PAROXETINE HCL 20 MG PO TABS
40.0000 mg | ORAL_TABLET | Freq: Every day | ORAL | Status: DC
  Administered 2019-09-13 – 2019-09-16 (×4): 40 mg via ORAL
  Filled 2019-09-13 (×4): qty 2

## 2019-09-13 MED ORDER — SENNOSIDES-DOCUSATE SODIUM 8.6-50 MG PO TABS: 1.0000 | ORAL_TABLET | Freq: Every evening | ORAL | Status: DC | PRN

## 2019-09-13 MED ORDER — HYDROCODONE-ACETAMINOPHEN 5-325 MG PO TABS
1.0000 | ORAL_TABLET | Freq: Once | ORAL | Status: AC
  Administered 2019-09-13: 04:00:00 1 via ORAL
  Filled 2019-09-13: qty 1

## 2019-09-13 MED ORDER — ENOXAPARIN SODIUM 40 MG/0.4ML ~~LOC~~ SOLN
40.0000 mg | Freq: Every day | SUBCUTANEOUS | Status: DC
  Administered 2019-09-13: 40 mg via SUBCUTANEOUS
  Filled 2019-09-13: qty 0.4

## 2019-09-13 MED ORDER — ROSUVASTATIN CALCIUM 20 MG PO TABS
40.0000 mg | ORAL_TABLET | Freq: Every day | ORAL | Status: DC
  Administered 2019-09-13 – 2019-09-15 (×3): 40 mg via ORAL
  Filled 2019-09-13 (×3): qty 2

## 2019-09-13 NOTE — Progress Notes (Signed)
   09/13/19 1518  Vitals  Temp 98.5 F (36.9 C)  Temp Source Oral  BP (!) 138/50  MAP (mmHg) 70  BP Method Automatic  Pulse Rate (!) 101  Resp (!) 21  Level of Consciousness  Level of Consciousness Alert  Oxygen Therapy  SpO2 95 %  O2 Device Nasal Cannula  O2 Flow Rate (L/min) 2 L/min  MEWS Score  MEWS Temp 0  MEWS Systolic 0  MEWS Pulse 1  MEWS RR 1  MEWS LOC 0  MEWS Score 2  MEWS Score Color Yellow  MEWS Assessment  Is this an acute change? Yes  MEWS guidelines implemented *See Row Information* Yellow  Provider Notification  Provider Name/Title Dr. Zigmund Daniel  Date Provider Notified 09/13/19  Time Provider Notified 1535  Notification Type Page  Notification Reason Change in status    Change in status. Pt changed from green to yellow mews. MD notified.

## 2019-09-13 NOTE — Progress Notes (Signed)
PROGRESS NOTE    Kaylee Hansen  ZOX:[id number] DOB: 08-08-1955 DOA: 09/12/2019 PCP: Cassandria Anger, MD    Brief Narrative: 64 y.o. female with medical history significant for coronary artery disease, chronic diastolic CHF, anxiety, COPD with chronic hypoxic respiratory failure, hypothyroidism, and chronic right hip pain, now presenting to the emergency department with uncontrolled right hip pain, increased shortness of breath, and increased cough.  Patient reports that her right hip has been hurting much more than usual for roughly a week now, unable to control the pain with her narcotic analgesics at home, reportedly saw her orthopedist in the clinic today with no acute findings on plain films, and was directed to the ED for pain control and further evaluation.  Patient was found to be dyspneic and acknowledges that her chronic shortness of breath and cough have been worse the past few days.  She denies fevers, chills, leg swelling or tenderness, or recent fall or trauma.  Her husband assist with her ADLs and she uses a wheelchair at baseline.  ED Course: Upon arrival to the ED, patient is found to be afebrile, saturating low 90s on 2 L/min of supplemental oxygen, tachypneic in the mid 20s, and with stable blood pressure.  EKG features sinus rhythm with RBBB.  MRI right hip was performed and the patient developed acute onset of chest pain while in the scanner, resolved when she was taken out of the machine.  Husband reports that she has had similar episodes with MRI that were attributed to panic.  Chemistry panel with potassium 5.3 and CBC with mild normocytic anemia.  VBG demonstrates hypoxia and Covid antigen test was negative.  High-sensitivity troponin was normal x2.  Patient was treated with multiple doses of IV analgesics in the ED and hospitalist consulted for admission.  Assessment & Plan:   Principal Problem:   COPD with acute exacerbation ([place]) Active Problems:   Hypothyroidism  Chest pain   Coronary artery disease involving native coronary artery of native heart with angina pectoris (HCC)   Intractable pain   Acute on chronic respiratory failure with hypoxia ([place])  #1 intractable right hip pain-patient admitted from Ortho clinic.  No history of falls or trauma.  Patient at baseline wheelchair-bound.  Her husband who is also disabled helps her with ADLs.  Patient reports her pain as 12 out of 10 and is requesting more pain medication.  MRI right hip done results are still pending. I will increase her dose of Dilaudid to 1 mg every 4 as needed and start her on long-acting OxyContin 10 twice daily.  Add Flexeril.  #2 acute on chronic hypoxic respiratory failure secondary to COPD exacerbation.-patient is on oxygen at home.  Chest x-ray was clear. Continue steroids taper as tolerated, continue azithromycin, long-acting and short acting beta agonist.  Continue 60 mg every 6 IV steroids Medrol taper as tolerated.  #3 atypical chest pain likely secondary to panic attack while in the MRI resolved once she came out of the MRI machine.  Normal troponin.  EKG with no acute changes.  #4 hypothyroidism continue Synthroid  #5 anxiety continue Xanax and Paxil  #6 history of chronic diastolic congestive heart failure stable on Lasix  #7 seizure disorder on Dilantin  Estimated body mass index is 27.29 kg/m as calculated from the following:   Height as of this encounter: 5\' 5"  (1.651 m).   Weight as of this encounter: 74.4 kg.  DVT prophylaxis: Lovenox Code Status: Full code  family Communication: Discussed with  patient disposition Plan: Patient came from home Not sure of discharge to home with home health or rehab PT evaluation is pending Barriers to discharge ongoing acute right hip pain not controlled with IV narcotics unable to get out of bed Consultants:   None though patient was sent from Ortho clinic  Procedures: None Antimicrobials:  Azithromycin  Subjective: Patient rolling around in bed due to pain very upset that her pain is not controlled in the hospital  Objective: Vitals:   09/13/19 0828 09/13/19 0837 09/13/19 0841 09/13/19 1035  BP:   (!) 122/58 (!) 134/51  Pulse:    (!) 101  Resp:    20  Temp:    97.9 F (36.6 C)  TempSrc:    Oral  SpO2: 94% (!) 84%  92%  Weight:      Height:        Intake/Output Summary (Last 24 hours) at 09/13/2019 1106 Last data filed at 09/13/2019 0939 Gross per 24 hour  Intake 408.84 ml  Output 300 ml  Net 108.84 ml   Filed Weights   09/12/19 1311  Weight: 74.4 kg    Examination:  General exam: Appears calm and comfortable  Respiratory system bilateral diffuse wheezing to auscultation. Respiratory effort normal. Cardiovascular system: S1 & S2 heard, RRR. No JVD, murmurs, rubs, gallops or clicks. No pedal edema. Gastrointestinal system: Abdomen is nondistended, soft and nontender. No organomegaly or masses felt. Normal bowel sounds heard. Central nervous system: Alert and oriented. No focal neurological deficits. Extremities: Lidocaine patch noted on the right hip no tender points noted  skin: No rashes, lesions or ulcers Psychiatry: Judgement and insight appear normal. Mood & affect appropriate.     Data Reviewed: I have personally reviewed following labs and imaging studies  CBC: Recent Labs  Lab 09/12/19 1631 09/13/19 0422  WBC 6.3 6.9  NEUTROABS 4.4 6.2  HGB 11.4* 10.9*  HCT 35.9* 34.0*  MCV 97.0 95.8  PLT 262 166   Basic Metabolic Panel: Recent Labs  Lab 09/12/19 1631 09/13/19 0422  NA 138 136  K 5.3* 4.9  CL 102 101  CO2 28 25  GLUCOSE 99 123*  BUN 12 12  CREATININE 0.94 0.78  CALCIUM 9.1 9.0   GFR: Estimated Creatinine Clearance: 70.8 mL/min (by C-G formula based on SCr of 0.78 mg/dL). Liver Function Tests: No results for input(s): AST, ALT, ALKPHOS, BILITOT, PROT, ALBUMIN in the last 168 hours. No results for input(s): LIPASE, AMYLASE  in the last 168 hours. No results for input(s): AMMONIA in the last 168 hours. Coagulation Profile: No results for input(s): INR, PROTIME in the last 168 hours. Cardiac Enzymes: No results for input(s): CKTOTAL, CKMB, CKMBINDEX, TROPONINI in the last 168 hours. BNP (last 3 results) No results for input(s): PROBNP in the last 8760 hours. HbA1C: No results for input(s): HGBA1C in the last 72 hours. CBG: No results for input(s): GLUCAP in the last 168 hours. Lipid Profile: No results for input(s): CHOL, HDL, LDLCALC, TRIG, CHOLHDL, LDLDIRECT in the last 72 hours. Thyroid Function Tests: No results for input(s): TSH, T4TOTAL, FREET4, T3FREE, THYROIDAB in the last 72 hours. Anemia Panel: No results for input(s): VITAMINB12, FOLATE, FERRITIN, TIBC, IRON, RETICCTPCT in the last 72 hours. Sepsis Labs: No results for input(s): PROCALCITON, LATICACIDVEN in the last 168 hours.  Recent Results (from the past 240 hour(s))  SARS CORONAVIRUS 2 (TAT 6-24 HRS) Nasopharyngeal Nasopharyngeal Swab     Status: None   Collection Time: 09/12/19  8:32 PM  Specimen: Nasopharyngeal Swab  Result Value Ref Range Status   SARS Coronavirus 2 NEGATIVE NEGATIVE Final    Comment: (NOTE) SARS-CoV-2 target nucleic acids are NOT DETECTED. The SARS-CoV-2 RNA is generally detectable in upper and lower respiratory specimens during the acute phase of infection. Negative results do not preclude SARS-CoV-2 infection, do not rule out co-infections with other pathogens, and should not be used as the sole basis for treatment or other patient management decisions. Negative results must be combined with clinical observations, patient history, and epidemiological information. The expected result is Negative. Fact Sheet for Patients: SugarRoll.be Fact Sheet for Healthcare Providers: https://www.[employer].com/ This test is not yet approved or cleared by the [place] FDA and   has been authorized for detection and/or diagnosis of SARS-CoV-2 by FDA under an Emergency Use Authorization (EUA). This EUA will remain  in effect (meaning this test can be used) for the duration of the COVID-19 declaration under Section 56 4(b)(1) of the Act, 21 U.S.C. section 360bbb-3(b)(1), unless the authorization is terminated or revoked sooner. Performed at [facility], [place] [address] [place], [place] [address]          Radiology Studies: DG Chest 2 View  Result Date: 09/12/2019 CLINICAL DATA:  Shortness of breath EXAM: CHEST - 2 VIEW COMPARISON:  04/21/2019, 10/03/2016 FINDINGS: No acute opacity or pleural effusion. Stable cardiomediastinal silhouette. No pneumothorax. Old bilateral rib fractures. IMPRESSION: No active cardiopulmonary disease. Electronically Signed   By: Donavan Foil M.D.   On: 09/12/2019 15:26   DG Hip Unilat  With Pelvis 2-3 Views Right  Result Date: 09/12/2019 CLINICAL DATA:  Right-sided hip pain EXAM: DG HIP (WITH OR WITHOUT PELVIS) 2-3V RIGHT COMPARISON:  06/14/2015 FINDINGS: Status post left hip replacement with intact hardware and normal alignment. Right hip shows no fracture or malalignment. Mild degenerative change of the right hip. IMPRESSION: Mild degenerative change of the right hip. No acute osseous abnormality. Status post left hip replacement with normal alignment Electronically Signed   By: Donavan Foil M.D.   On: 09/12/2019 15:25        Scheduled Meds: . clopidogrel  75 mg Oral Daily  . cyclobenzaprine  5 mg Oral TID  . diltiazem  120 mg Oral Daily  . enoxaparin (LOVENOX) injection  40 mg Subcutaneous Daily  . furosemide  20 mg Oral Daily  . ipratropium-albuterol  3 mL Nebulization TID  . isosorbide mononitrate  30 mg Oral Daily  . levothyroxine  150 mcg Oral Daily  . lidocaine  1 patch Transdermal QHS  . methylPREDNISolone (SOLU-MEDROL) injection  60 mg Intravenous Q6H  . mometasone-formoterol  2 puff Inhalation BID  .  oxyCODONE  10 mg Oral Q12H  . pantoprazole  40 mg Oral Daily  . PARoxetine  40 mg Oral Daily  . phenytoin  100 mg Oral Daily  . phenytoin  200 mg Oral QHS  . rosuvastatin  40 mg Oral q1800  . sodium chloride flush  3 mL Intravenous Q12H  . sodium chloride flush  3 mL Intravenous Q12H   Continuous Infusions: . sodium chloride Stopped (09/13/19 0153)  . azithromycin Stopped (09/13/19 0020)     LOS: 1 day     Georgette Shell, MD 09/13/2019, 11:06 AM

## 2019-09-14 LAB — PHENYTOIN LEVEL, TOTAL: Phenytoin Lvl: 10 ug/mL (ref 10.0–20.0)

## 2019-09-14 MED ORDER — METHYLPREDNISOLONE SODIUM SUCC 125 MG IJ SOLR
60.0000 mg | Freq: Two times a day (BID) | INTRAMUSCULAR | Status: DC
  Administered 2019-09-14 – 2019-09-15 (×3): 60 mg via INTRAVENOUS
  Filled 2019-09-14 (×3): qty 2

## 2019-09-14 MED ORDER — HYDROXYZINE HCL 10 MG PO TABS
10.0000 mg | ORAL_TABLET | Freq: Three times a day (TID) | ORAL | Status: DC
  Administered 2019-09-14 – 2019-09-16 (×6): 10 mg via ORAL
  Filled 2019-09-14 (×6): qty 1

## 2019-09-14 MED ORDER — NICOTINE 21 MG/24HR TD PT24
21.0000 mg | MEDICATED_PATCH | Freq: Every day | TRANSDERMAL | Status: DC
  Administered 2019-09-14 – 2019-09-16 (×3): 21 mg via TRANSDERMAL
  Filled 2019-09-14 (×3): qty 1

## 2019-09-14 NOTE — Evaluation (Signed)
Physical Therapy Evaluation Patient Details Name: Kaylee Hansen MRN: [id number] DOB: April 11, 1956 Today's Date: 09/14/2019   History of Present Illness  64 y.o. female with medical history significant for coronary artery disease, chronic diastolic CHF, anxiety, COPD with chronic hypoxic respiratory failure, hypothyroidism, and chronic right hip pain, now presenting to the emergency department with uncontrolled right hip pain, increased shortness of breath, and increased cough. IR consult pending for R hip injection per chart (pt husband reports to PT that -->cortisone injection is the reason that they were at the ortho office, however office sent them to ED d/t pt dyspnea, pt has dyspnea when off her O2 per husband, does not have portable O2 d/t SpO2 normally running  in 90s on RA)  Clinical Impression  Pt admitted with above diagnosis. Pt is grossly close to baseline, R hip pain is exacerbated at this time some what limiting mobility and OOB tolerance.  Pt and husband initially decline HHPT then later agree.  Will follow in acute setting    Pt currently with functional limitations due to the deficits listed below (see PT Problem List). Pt will benefit from skilled PT to increase their independence and safety with mobility to allow discharge to the venue listed below.       Follow Up Recommendations Home health PT    Equipment Recommendations  None recommended by PT    Recommendations for Other Services       Precautions / Restrictions Precautions Precautions: Fall Precaution Comments: O2 dependent at night Restrictions Weight Bearing Restrictions: No      Mobility  Bed Mobility Overal bed mobility: Needs Assistance Bed Mobility: Supine to Sit     Supine to sit: Supervision     General bed mobility comments: for safety  Transfers Overall transfer level: Needs assistance Equipment used: None Transfers: Lateral/Scoot Transfers          Lateral/Scoot Transfers: Min  guard;Min assist General transfer comment: min/guard for safety. bed to chair to pt right side d/t only the Left arm rest on chair able to drop.  Ambulation/Gait             General Gait Details: non-amb at baseline  Stairs            Wheelchair Mobility    Modified Rankin (Stroke Patients Only)       Balance Overall balance assessment: Needs assistance   Sitting balance-Leahy Scale: Good         Standing balance comment: NT                             Pertinent Vitals/Pain Pain Assessment: Faces Faces Pain Scale: Hurts whole lot Pain Location: right hip Pain Descriptors / Indicators: Grimacing;Guarding Pain Intervention(s): Limited activity within patient's tolerance;Monitored during session;Premedicated before session;Repositioned    Home Living Family/patient expects to be discharged to:: Private residence Living Arrangements: Spouse/significant other   Type of Home: House Home Access: Ramped entrance     Home Layout: One level Home Equipment: Environmental consultant - 2 wheels;Walker - 4 wheels;Wheelchair - Education officer, community - power      Prior Function Level of Independence: Needs assistance   Gait / Transfers Assistance Needed: intermittent assist wtih transfers d/t R hip pain per pt husband.     Comments: uses power chair in house, manual when going out; husband pushes pt in manual chair     Hand Dominance        Extremity/Trunk Assessment  Upper Extremity Assessment Upper Extremity Assessment: Overall WFL for tasks assessed    Lower Extremity Assessment Lower Extremity Assessment: RLE deficits/detail RLE Deficits / Details: AAROM grossly WFL, unable to test strength formally d/t pain; grossly at least 3/5       Communication   Communication: No difficulties  Cognition Arousal/Alertness: Awake/alert Behavior During Therapy: WFL for tasks assessed/performed Overall Cognitive Status: Within Functional Limits for tasks assessed                                  General Comments: slow to repond at times, pt husband answers some questions for her      General Comments      Exercises     Assessment/Plan    PT Assessment Patient needs continued PT services  PT Problem List Decreased strength;Decreased activity tolerance;Pain;Decreased mobility       PT Treatment Interventions Therapeutic exercise;Functional mobility training;Therapeutic activities;Patient/family education    PT Goals (Current goals can be found in the Care Plan section)  Acute Rehab PT Goals Patient Stated Goal: home PT Goal Formulation: With patient Time For Goal Achievement: 09/28/19 Potential to Achieve Goals: Fair    Frequency Min 3X/week   Barriers to discharge        Co-evaluation               AM-PAC PT "6 Clicks" Mobility  Outcome Measure Help needed turning from your back to your side while in a flat bed without using bedrails?: None Help needed moving from lying on your back to sitting on the side of a flat bed without using bedrails?: A Little Help needed moving to and from a bed to a chair (including a wheelchair)?: A Little Help needed standing up from a chair using your arms (e.g., wheelchair or bedside chair)?: A Lot Help needed to walk in hospital room?: Total Help needed climbing 3-5 steps with a railing? : Total 6 Click Score: 14    End of Session   Activity Tolerance: Patient limited by pain Patient left: with call bell/phone within reach;in chair;with family/visitor present Nurse Communication: Mobility status PT Visit Diagnosis: Other abnormalities of gait and mobility (R26.89)    Time: 6222-9798 PT Time Calculation (min) (ACUTE ONLY): 26 min   Charges:   PT Evaluation $PT Eval Low Complexity: 1 Low PT Treatments $Gait Training: 8-22 mins        Baxter Flattery, PT   Acute Rehab Dept [facility]): [phone]   09/14/2019   [facility] 09/14/2019, 1:36 PM

## 2019-09-14 NOTE — Progress Notes (Addendum)
PROGRESS NOTE    Kaylee Hansen  ZOX:[id number] DOB: 1956-05-14 DOA: 09/12/2019 PCP: Cassandria Anger, MD    Brief Narrative: 64 y.o.femalewith medical history significant forcoronary artery disease, chronic diastolic CHF, anxiety, COPD with chronic hypoxic respiratory failure, hypothyroidism, and chronic right hip pain, now presenting to the emergency department with uncontrolled right hip pain, increased shortness of breath, and increased cough. Patient reports that her right hip has been hurting much more than usual for roughly a week now, unable to control the pain with her narcotic analgesics at home, reportedly saw her orthopedist in the clinic today with no acute findings on plain films, and was directed to the ED for pain control and further evaluation. Patient was found to be dyspneic and acknowledges that her chronic shortness of breath and cough have been worse the past few days. She denies fevers, chills, leg swelling or tenderness, or recent fall or trauma. Her husband assist with her ADLs and she uses a wheelchair at baseline.  ED Course:Upon arrival to the ED, patient is found to be afebrile, saturating low 90s on 2 L/min of supplemental oxygen, tachypneic in the mid 20s, and with stable blood pressure. EKG features sinus rhythm with RBBB. MRI right hip was performed and the patient developed acute onset of chest pain while in the scanner, resolved when she was taken out of the machine. Husband reports that she has had similar episodes with MRI that were attributed to panic. Chemistry panel with potassium 5.3 and CBC with mild normocytic anemia. VBG demonstrates hypoxia and Covid antigen test was negative. High-sensitivity troponin was normal x2. Patient was treated with multiple doses of IV analgesics in the ED and hospitalist consulted for admission  Assessment & Plan:   Principal Problem:   COPD with acute exacerbation ([place]) Active Problems:   Hypothyroidism  Chest pain   Coronary artery disease involving native coronary artery of native heart with angina pectoris (HCC)   Intractable pain   Acute on chronic respiratory failure with hypoxia ([place])   #1 intractable right hip pain secondary to avascular necrosis of the right hip-patient admitted from Ortho clinic.  Dr. Alvan Dame.  Discussed with Dr. [facility] today.  Will consult IR for intra-articular injection of cortisone. No history of falls or trauma.  Patient at baseline wheelchair-bound.  Her husband who is also disabled helps her with ADLs.    MRI right hip right hip avascular necrosis.  Continue Dilaudid and OxyContin and Flexeril. We will start her on Atarax for itching secondary to narcotics  #2 acute on chronic hypoxic respiratory failure secondary to COPD exacerbation.-patient is on oxygen at home.  Chest x-ray was clear. Continue steroids taper as tolerated, continue azithromycin, long-acting and short acting beta agonist.  Taper IV steroids.   #3 atypical chest pain likely secondary to panic attack while in the MRI resolved once she came out of the MRI machine.  Normal troponin.  EKG with no acute changes.  #4 hypothyroidism continue Synthroid  #5 anxiety continue Xanax and Paxil  #6 history of chronic diastolic congestive heart failure stable on Lasix  #7 seizure disorder on Dilantin   Estimated body mass index is 27.29 kg/m as calculated from the following:   Height as of this encounter: 5\' 5"  (1.651 m).   Weight as of this encounter: 74.4 kg.  DVT prophylaxis: Lovenox Code Status: Full code  family Communication: Discussed with patient disposition Plan: Patient came from home Not sure of discharge to home with home health or  rehab PT evaluation is pending Barriers to discharge ongoing acute right hip pain not controlled with IV narcotics unable to get out of bed IR consult pending for right hip intra-articular injection of cortisone.  Consultants:  Discussed with Dr.  Veverly Fells on the phone Consulted IR   Procedures: None Antimicrobials: Azithromycin   Subjective:  She looks better than yesterday however she reports pain severe still. Asking to go out and smoke. Took off the oxygen accidentally per patient. She complains of itching since starting Dilaudid and doxy  Objective: Vitals:   09/14/19 0545 09/14/19 0837 09/14/19 0850 09/14/19 0853  BP: (!) 137/52 (!) 145/48    Pulse: 93     Resp: 16     Temp: 98.8 F (37.1 C)     TempSrc: Oral     SpO2: 94% 93% 94% 94%  Weight:      Height:        Intake/Output Summary (Last 24 hours) at 09/14/2019 1104 Last data filed at 09/14/2019 2993 Gross per 24 hour  Intake 510 ml  Output 750 ml  Net -240 ml   Filed Weights   09/12/19 1311  Weight: 74.4 kg    Examination:  General exam: Appears calm and comfortable  Respiratory system diffuse wheezing and rhonchi to auscultation. Respiratory effort normal. Cardiovascular system: S1 & S2 heard, RRR. No JVD, murmurs, rubs, gallops or clicks. No pedal edema. Gastrointestinal system: Abdomen is nondistended, soft and nontender. No organomegaly or masses felt. Normal bowel sounds heard. Central nervous system: Alert and oriented. No focal neurological deficits. Extremities: No edema decreased range of motion to the right hip Skin: No rashes, lesions or ulcers Psychiatry: Judgement and insight appear normal. Mood & affect appropriate.     Data Reviewed: I have personally reviewed following labs and imaging studies  CBC: Recent Labs  Lab 09/12/19 1631 09/13/19 0422  WBC 6.3 6.9  NEUTROABS 4.4 6.2  HGB 11.4* 10.9*  HCT 35.9* 34.0*  MCV 97.0 95.8  PLT 262 716   Basic Metabolic Panel: Recent Labs  Lab 09/12/19 1631 09/13/19 0422  NA 138 136  K 5.3* 4.9  CL 102 101  CO2 28 25  GLUCOSE 99 123*  BUN 12 12  CREATININE 0.94 0.78  CALCIUM 9.1 9.0   GFR: Estimated Creatinine Clearance: 70.8 mL/min (by C-G formula based on SCr of 0.78  mg/dL). Liver Function Tests: No results for input(s): AST, ALT, ALKPHOS, BILITOT, PROT, ALBUMIN in the last 168 hours. No results for input(s): LIPASE, AMYLASE in the last 168 hours. No results for input(s): AMMONIA in the last 168 hours. Coagulation Profile: No results for input(s): INR, PROTIME in the last 168 hours. Cardiac Enzymes: No results for input(s): CKTOTAL, CKMB, CKMBINDEX, TROPONINI in the last 168 hours. BNP (last 3 results) No results for input(s): PROBNP in the last 8760 hours. HbA1C: No results for input(s): HGBA1C in the last 72 hours. CBG: No results for input(s): GLUCAP in the last 168 hours. Lipid Profile: No results for input(s): CHOL, HDL, LDLCALC, TRIG, CHOLHDL, LDLDIRECT in the last 72 hours. Thyroid Function Tests: No results for input(s): TSH, T4TOTAL, FREET4, T3FREE, THYROIDAB in the last 72 hours. Anemia Panel: No results for input(s): VITAMINB12, FOLATE, FERRITIN, TIBC, IRON, RETICCTPCT in the last 72 hours. Sepsis Labs: No results for input(s): PROCALCITON, LATICACIDVEN in the last 168 hours.  Recent Results (from the past 240 hour(s))  SARS CORONAVIRUS 2 (TAT 6-24 HRS) Nasopharyngeal Nasopharyngeal Swab     Status: None  Collection Time: 09/12/19  8:32 PM   Specimen: Nasopharyngeal Swab  Result Value Ref Range Status   SARS Coronavirus 2 NEGATIVE NEGATIVE Final    Comment: (NOTE) SARS-CoV-2 target nucleic acids are NOT DETECTED. The SARS-CoV-2 RNA is generally detectable in upper and lower respiratory specimens during the acute phase of infection. Negative results do not preclude SARS-CoV-2 infection, do not rule out co-infections with other pathogens, and should not be used as the sole basis for treatment or other patient management decisions. Negative results must be combined with clinical observations, patient history, and epidemiological information. The expected result is Negative. Fact Sheet for  Patients: SugarRoll.be Fact Sheet for Healthcare Providers: https://www.woods-[place].com/ This test is not yet approved or cleared by the [place] FDA and  has been authorized for detection and/or diagnosis of SARS-CoV-2 by FDA under an Emergency Use Authorization (EUA). This EUA will remain  in effect (meaning this test can be used) for the duration of the COVID-19 declaration under Section 56 4(b)(1) of the Act, 21 U.S.C. section 360bbb-3(b)(1), unless the authorization is terminated or revoked sooner. Performed at [facility], [place] [address] [place], [place] [address]          Radiology Studies: DG Chest 2 View  Result Date: 09/12/2019 CLINICAL DATA:  Shortness of breath EXAM: CHEST - 2 VIEW COMPARISON:  04/21/2019, 10/03/2016 FINDINGS: No acute opacity or pleural effusion. Stable cardiomediastinal silhouette. No pneumothorax. Old bilateral rib fractures. IMPRESSION: No active cardiopulmonary disease. Electronically Signed   By: Donavan Foil M.D.   On: 09/12/2019 15:26   MR HIP RIGHT WO CONTRAST  Result Date: 09/13/2019 CLINICAL DATA:  Right hip pain, difficulty bearing weight EXAM: MR OF THE RIGHT HIP WITHOUT CONTRAST TECHNIQUE: Multiplanar, multisequence MR imaging was performed. No intravenous contrast was administered. COMPARISON:  X-ray 09/12/2019, MRI 02/22/2006 FINDINGS: Technical note: Incomplete examination as only the large field-of-view coronal T1 and STIR sequences were obtained. Patient began to experience chest pain and the examination was discontinued. Within the posterior aspect of the right femoral head is a geographic area of peripherally low T1/T2 signal with central T1 and T2 hyperintensity compatible with avascular necrosis. Area approximately 2.1 x 1.4 cm (series 4, images 10-11; series 5, image 10). No surrounding bone marrow edema. No evidence of cortical collapse on the included images. No fracture  identified. No right hip joint effusion. Susceptibility artifact from left total hip arthroplasty. No acute osseous abnormalities are evident within the limitations of this incomplete exam. Myotendinous structures appear grossly intact. No abnormal bursal fluid collection. IMPRESSION: 1. Limited, incomplete exam.  See above. 2. 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 3. No acute findings within the above limitations. Electronically Signed   By: Davina Poke D.O.   On: 09/13/2019 11:27   DG Hip Unilat  With Pelvis 2-3 Views Right  Result Date: 09/12/2019 CLINICAL DATA:  Right-sided hip pain EXAM: DG HIP (WITH OR WITHOUT PELVIS) 2-3V RIGHT COMPARISON:  06/14/2015 FINDINGS: Status post left hip replacement with intact hardware and normal alignment. Right hip shows no fracture or malalignment. Mild degenerative change of the right hip. IMPRESSION: Mild degenerative change of the right hip. No acute osseous abnormality. Status post left hip replacement with normal alignment Electronically Signed   By: Donavan Foil M.D.   On: 09/12/2019 15:25        Scheduled Meds: . clopidogrel  75 mg Oral Daily  . cyclobenzaprine  5 mg Oral  TID  . diltiazem  120 mg Oral Daily  . enoxaparin (LOVENOX) injection  40 mg Subcutaneous Daily  . furosemide  20 mg Oral Daily  . ipratropium-albuterol  3 mL Nebulization TID  . isosorbide mononitrate  30 mg Oral Daily  . levothyroxine  150 mcg Oral Daily  . lidocaine  1 patch Transdermal QHS  . methylPREDNISolone (SOLU-MEDROL) injection  60 mg Intravenous Q6H  . mometasone-formoterol  2 puff Inhalation BID  . oxyCODONE  10 mg Oral Q12H  . pantoprazole  40 mg Oral Daily  . PARoxetine  40 mg Oral Daily  . phenytoin  100 mg Oral Daily  . phenytoin  200 mg Oral QHS  . rosuvastatin  40 mg Oral q1800  . sodium chloride flush  3 mL Intravenous Q12H  . sodium chloride flush  3 mL Intravenous Q12H   Continuous  Infusions: . sodium chloride Stopped (09/13/19 0153)  . azithromycin 500 mg (09/13/19 2230)     LOS: 2 days   Georgette Shell, MD  09/14/2019, 11:04 AM

## 2019-09-14 NOTE — Consult Note (Signed)
Reason for Consult: Right hip pain/AVN Referring Physician: Zigmund Daniel MD  Kaylee Hansen is an 64 y.o. female.  HPI: 64 yo female referred to [place] from [facility] for worsening SOB who was complaining of right hip pain as her reason for visiting ortho. Patient admitted for medical management of her SOB and other medical issues.   She continues to complain of right hip pain worse with activity and WB and relieved with rest. No other ortho complaints.  Past Medical History:  Diagnosis Date  . Acute respiratory failure following trauma and surgery ([place]) 08/26/2012  . Anxiety   . Aortic insufficiency with aortic stenosis 04/2017   TTE December 2019: Mild AS with severe regurgitation.  Mild LA dilation.;;  TEE January 2016: Severe-type III aortic regurgitation (holodiastolic flow reversal in the a sending aorta & vena contracta =6.   . CAD S/P percutaneous coronary angioplasty 11/2017   Proximal RCA PCI Synergy DES 3.5 mm x 16 mm (3.8 mm). Ost-mLM 30%. Ost-prox Cx 40%.  . Collagenous colitis   . Colon adenomas 2011  . Constipation    Chronic abdominal pain and constipation  . COPD (chronic obstructive pulmonary disease) ([place])   . Depression   . Diverticulosis   . Esophageal stricture 11/08/2012   Ulcer noted in 2010  . GERD (gastroesophageal reflux disease)   . Helicobacter pylori gastritis 2010   Pylera Tx  . History of cholecystectomy   . Hx of appendectomy   . Hx of cancer of lung 1999  . Hx of hysterectomy   . Hyperlipidemia   . Hypothyroidism   . Low back pain   . Non-STEMI (non-ST elevated myocardial infarction) ([place]) 11/2017   RCA PCI  . Osteoarthritis of knee    bilateral knee  . Seizures ([place])   . Stroke [facility])    CVA, hx of 97  . 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 ABDOMINAL HYSTERECTOMY     complete 1992  . ABDOMINAL SURGERY     Exploratory  . APPENDECTOMY    . BALLOON DILATION N/A 11/08/2012   Procedure:  BALLOON DILATION;  Surgeon: Gatha Mayer, MD;  Location: [facility] ENDOSCOPY;  Service: Endoscopy;  Laterality: N/A;  . CHOLECYSTECTOMY    . COLONOSCOPY W/ BIOPSIES     multiple  . CORONARY STENT INTERVENTION N/A 06/28/2018   Procedure: CORONARY STENT INTERVENTION;  Surgeon: Martinique, Peter M, MD;  Location: [place] CV LAB;  Service: Cardiovascular;  Laterality: N/A;  90% prox RCA -> PCI with Synergy DES 3.5 mm x 16 mm (3.8 mm).  . CT CTA CORONARY W/CA SCORE W/CM &/OR WO/CM  05/2017   Coronary calcium score 2.9. Intermediate risk. Unusual noncalcified plaque in RCA --FFR negative  . ESOPHAGOGASTRODUODENOSCOPY     w/baloon x 2  . ESOPHAGOGASTRODUODENOSCOPY N/A 11/08/2012   Procedure: ESOPHAGOGASTRODUODENOSCOPY (EGD);  Surgeon: Gatha Mayer, MD;  Location: [facility];  Service: Endoscopy;  Laterality: N/A;  . ESOPHAGOGASTRODUODENOSCOPY (EGD) WITH PROPOFOL N/A 08/25/2019   Procedure: ESOPHAGOGASTRODUODENOSCOPY (EGD) WITH PROPOFOL;  Surgeon: Gatha Mayer, MD;  Location: [facility];  Service: Endoscopy;  Laterality: N/A;  . LEFT HEART CATH AND CORONARY ANGIOGRAPHY N/A 06/28/2018   Procedure: LEFT HEART CATH AND CORONARY ANGIOGRAPHY;  Surgeon: Martinique, Peter M, MD;  Location: [place] CV LAB;  Service: Cardiovascular:  90% prox RCA -> DES PCI. Ost-mLM 30%. Ost-prox Cx 40%.   EF 55-65%. LVEDP 15 mmHg.   [name]  MALONEY DILATION  08/25/2019   Procedure: MALONEY DILATION;  Surgeon: Gatha Mayer, MD;  Location: [facility];  Service: Endoscopy;;  . RIGHT HEART CATH N/A 07/11/2018   Procedure: RIGHT HEART CATH;  Surgeon: Leonie Man, MD;  Location: [place] CV LAB;;   Relatively normal Right Heart Cath pressures: PA pressure 26/14 mmHg-mean 20 mmHg; PCWP 13 mmHg.  RAP 6 mmHg, RVP 28/3 mmHg-EDP 10 mmHg. Severely reduced cardiac output and index by Fick: 3.63 and 2.07.  . TEE WITHOUT CARDIOVERSION N/A 07/11/2018   Procedure: TRANSESOPHAGEAL ECHOCARDIOGRAM (TEE);  Surgeon: [name], MD;   Location: [facility] ENDOSCOPY;  Service: Cardiology;;  Severe aortic regurgitation-type III. (suggested by holodiastolic flow reversal and descending aorta-vena contracta 6 mm).  . TOTAL HIP ARTHROPLASTY     Left  . TRANSTHORACIC ECHOCARDIOGRAM  05/2018   EF 60-65%.  No R WMA.  GR 1 DD.  Mild aortic stenosis with severe regurgitation.  Mild MR..  Only mild LV dilation noted.  . TRANSTHORACIC ECHOCARDIOGRAM  12/2018    EF 60-65%. Mild LVH. Gr1 DD. Mod AoV thickening. Mod-Severe AI, ~ mlid AS.    Family History  Problem Relation Age of Onset  . Coronary artery disease Mother   . Diabetes Mother   . Hypertension Father   . Diabetes Son   . Coronary artery disease Other        grandmother, grandfather  . Kidney disease Other        aunt  . Kidney cancer Sister   . Colon cancer Neg Hx        colon    Social History:  reports that she has been smoking cigarettes. She has a 48.00 pack-year smoking history. She has never used smokeless tobacco. She reports that she does not drink alcohol or use drugs.  Allergies:  Allergies  Allergen Reactions  . Morphine     "Makes me go crazy."   . Nitrofuran Derivatives     ??confusion  . Oxycodone Hcl Other (See Comments)    Knocked her out for 6 days  . Penicillins Hives    Has patient had a PCN reaction causing immediate rash, facial/tongue/throat swelling, SOB or lightheadedness with hypotension: unknown Has patient had a PCN reaction causing severe rash involving mucus membranes or skin necrosis: unknown Has patient had a PCN reaction that required hospitalization No Has patient had a PCN reaction occurring within the last 10 years: No If all of the above answers are "NO", then may proceed with Cephalosporin use.   . Tape Other (See Comments)    Skin turns red and burns    Medications: I have reviewed the patient's current medications.  Results for orders placed or performed during the hospital encounter of 09/12/19 (from the past 48 hour(s))   Troponin I (High Sensitivity)     Status: None   Collection Time: 09/12/19  5:52 PM  Result Value Ref Range   Troponin I (High Sensitivity) 3 <18 ng/L    Comment: (NOTE) Elevated high sensitivity troponin I (hsTnI) values and significant  changes across serial measurements may suggest ACS but many other  chronic and acute conditions are known to elevate hsTnI results.  Refer to the "Links" section for chest pain algorithms and additional  guidance. Performed at [facility], [place] [address], [place], [place] [address]   Troponin I (High Sensitivity)     Status: None   Collection Time: 09/12/19  7:50 PM  Result Value Ref Range  Troponin I (High Sensitivity) 4 <18 ng/L    Comment: (NOTE) Elevated high sensitivity troponin I (hsTnI) values and significant  changes across serial measurements may suggest ACS but many other  chronic and acute conditions are known to elevate hsTnI results.  Refer to the "Links" section for chest pain algorithms and additional  guidance. Performed at [facility], [place] [address] [place], [place] [address]   SARS CORONAVIRUS 2 (TAT 6-24 HRS) Nasopharyngeal Nasopharyngeal Swab     Status: None   Collection Time: 09/12/19  8:32 PM   Specimen: Nasopharyngeal Swab  Result Value Ref Range   SARS Coronavirus 2 NEGATIVE NEGATIVE    Comment: (NOTE) SARS-CoV-2 target nucleic acids are NOT DETECTED. The SARS-CoV-2 RNA is generally detectable in upper and lower respiratory specimens during the acute phase of infection. Negative results do not preclude SARS-CoV-2 infection, do not rule out co-infections with other pathogens, and should not be used as the sole basis for treatment or other patient management decisions. Negative results must be combined with clinical observations, patient history, and epidemiological information. The expected result is Negative. Fact Sheet for  Patients: SugarRoll.be Fact Sheet for Healthcare Providers: https://www.woods-mathews.com/ This test is not yet approved or cleared by the [place] FDA and  has been authorized for detection and/or diagnosis of SARS-CoV-2 by FDA under an Emergency Use Authorization (EUA). This EUA will remain  in effect (meaning this test can be used) for the duration of the COVID-19 declaration under Section 56 4(b)(1) of the Act, 21 U.S.C. section 360bbb-3(b)(1), unless the authorization is terminated or revoked sooner. Performed at [facility], [place] [address], [place], [place] [address]   Blood gas, arterial     Status: Abnormal   Collection Time: 09/12/19  9:00 PM  Result Value Ref Range   O2 Content 2.0 L/min   pH, Arterial 7.381 7.350 - 7.450   pCO2 arterial 48.5 (H) 32.0 - 48.0 mmHg   pO2, Arterial 86.6 83.0 - 108.0 mmHg   Bicarbonate 28.1 (H) 20.0 - 28.0 mmol/L   Acid-Base Excess 2.3 (H) 0.0 - 2.0 mmol/L   O2 Saturation 95.7 %   Patient temperature 98.6    Allens test (pass/fail) PASS PASS    Comment: Performed at [facility], [place] [address], [place], [place] [address]  HIV Antibody (routine testing w rflx)     Status: None   Collection Time: 09/13/19  4:22 AM  Result Value Ref Range   HIV Screen 4th Generation wRfx NON REACTIVE NON REACTIVE    Comment: Performed at [place] [address], [place], [place] [address]  Basic metabolic panel     Status: Abnormal   Collection Time: 09/13/19  4:22 AM  Result Value Ref Range   Sodium 136 135 - 145 mmol/L   Potassium 4.9 3.5 - 5.1 mmol/L   Chloride 101 98 - 111 mmol/L   CO2 25 22 - 32 mmol/L   Glucose, Bld 123 (H) 70 - 99 mg/dL    Comment: Glucose reference range applies only to samples taken after fasting for at least 8 hours.   BUN 12 8 - 23 mg/dL   Creatinine, Ser 0.78 0.44 - 1.00 mg/dL   Calcium 9.0 8.9 - 10.3 mg/dL   GFR calc non Af Amer >60 >60  mL/min   GFR calc Af Amer >60 >60 mL/min   Anion gap 10 5 - 15    Comment: Performed at [facility],  [place] [address] [place], [place] [address]  CBC WITH DIFFERENTIAL     Status: Abnormal   Collection Time: 09/13/19  4:22 AM  Result Value Ref Range   WBC 6.9 4.0 - 10.5 K/uL   RBC 3.55 (L) 3.87 - 5.11 MIL/uL   Hemoglobin 10.9 (L) 12.0 - 15.0 g/dL   HCT 34.0 (L) 36.0 - 46.0 %   MCV 95.8 80.0 - 100.0 fL   MCH 30.7 26.0 - 34.0 pg   MCHC 32.1 30.0 - 36.0 g/dL   RDW 13.9 11.5 - 15.5 %   Platelets 250 150 - 400 K/uL   nRBC 0.0 0.0 - 0.2 %   Neutrophils Relative % 90 %   Neutro Abs 6.2 1.7 - 7.7 K/uL   Lymphocytes Relative 8 %   Lymphs Abs 0.5 (L) 0.7 - 4.0 K/uL   Monocytes Relative 1 %   Monocytes Absolute 0.1 0.1 - 1.0 K/uL   Eosinophils Relative 0 %   Eosinophils Absolute 0.0 0.0 - 0.5 K/uL   Basophils Relative 1 %   Basophils Absolute 0.0 0.0 - 0.1 K/uL   Immature Granulocytes 0 %   Abs Immature Granulocytes 0.03 0.00 - 0.07 K/uL    Comment: Performed at [facility], [place] [address] [place], [place] [address]  Phenytoin level, total     Status: None   Collection Time: 09/14/19  4:50 AM  Result Value Ref Range   Phenytoin Lvl 10.0 10.0 - 20.0 ug/mL    Comment: Performed at [facility], [place] [address] [place], [place] [address]    XRAYs of the hip and pelvis show a left THA with no complicating features. Right hip with no fractures MRI of the right hip shows AVN of the hip without collapse  Review of Systems Blood pressure (!) 119/39, pulse 96, temperature 97.9 F (36.6 C), temperature source Oral, resp. rate 20, height 5\' 5"  (1.651 m), weight 74.4 kg, SpO2 98 %. Physical Exam Awake and alert, normal AROM c-spine, bilateral shoulders, elbows and wrists with pain free AROM and good strength, C and T and L spine non tender Right hip with pain free passive ROM but pain with unassisted SLR, knee is nontender and no pain  with AROM, distally NVI Left LE with pain free hip ROM, some tenderness in the left knee and pain with AROM(she reports this is baseline) NVI L LE  Assessment/Plan: Right hip pain due to AVN of the femoral head.  Recommend modified WB to the right LE - 25% as tolerated Recommend IR flouro guided right hip intra-articular injection for pain relief Recommend outapatient follow up with Dr Alvan Dame once medical condition allows  Augustin Schooling 09/14/2019, 5:40 PM

## 2019-09-15 ENCOUNTER — Inpatient Hospital Stay (HOSPITAL_COMMUNITY): Payer: Medicare HMO

## 2019-09-15 LAB — COMPREHENSIVE METABOLIC PANEL
ALT: 20 U/L (ref 0–44)
AST: 20 U/L (ref 15–41)
Albumin: 3.5 g/dL (ref 3.5–5.0)
Alkaline Phosphatase: 87 U/L (ref 38–126)
Anion gap: 10 (ref 5–15)
BUN: 21 mg/dL (ref 8–23)
CO2: 27 mmol/L (ref 22–32)
Calcium: 9.3 mg/dL (ref 8.9–10.3)
Chloride: 100 mmol/L (ref 98–111)
Creatinine, Ser: 0.97 mg/dL (ref 0.44–1.00)
GFR calc Af Amer: >60 mL/min (ref >60)
GFR calc non Af Amer: >60 mL/min (ref >60)
Glucose, Bld: 126 mg/dL — ABNORMAL HIGH (ref 70–99)
Potassium: 5.2 mmol/L — ABNORMAL HIGH (ref 3.5–5.1)
Sodium: 137 mmol/L (ref 135–145)
Total Bilirubin: 0.1 mg/dL — ABNORMAL LOW (ref 0.3–1.2)
Total Protein: 6.6 g/dL (ref 6.5–8.1)

## 2019-09-15 LAB — CBC
HCT: 31.7 % — ABNORMAL LOW (ref 36.0–46.0)
Hemoglobin: 10 g/dL — ABNORMAL LOW (ref 12.0–15.0)
MCH: 30.9 pg (ref 26.0–34.0)
MCHC: 31.5 g/dL (ref 30.0–36.0)
MCV: 97.8 fL (ref 80.0–100.0)
Platelets: 268 10*3/uL (ref 150–400)
RBC: 3.24 MIL/uL — ABNORMAL LOW (ref 3.87–5.11)
RDW: 14.3 % (ref 11.5–15.5)
WBC: 12.3 10*3/uL — ABNORMAL HIGH (ref 4.0–10.5)
nRBC: 0 % (ref 0.0–0.2)

## 2019-09-15 MED ORDER — GUAIFENESIN ER 600 MG PO TB12
600.0000 mg | ORAL_TABLET | Freq: Two times a day (BID) | ORAL | Status: DC
  Administered 2019-09-15 – 2019-09-16 (×3): 600 mg via ORAL
  Filled 2019-09-15 (×3): qty 1

## 2019-09-15 MED ORDER — IOHEXOL 300 MG/ML  SOLN
50.0000 mL | Freq: Once | INTRAMUSCULAR | Status: AC | PRN
  Administered 2019-09-15: 3 mL via INTRA_ARTICULAR

## 2019-09-15 MED ORDER — METHYLPREDNISOLONE ACETATE 80 MG/ML IJ SUSP
INTRAMUSCULAR | Status: AC
  Administered 2019-09-15: 14:00:00 80 mg via INTRA_ARTICULAR
  Filled 2019-09-15: qty 1

## 2019-09-15 MED ORDER — LIDOCAINE HCL 1 % IJ SOLN
INTRAMUSCULAR | Status: AC
  Administered 2019-09-15: 14:00:00 5 mL via INTRA_ARTICULAR
  Filled 2019-09-15: qty 20

## 2019-09-15 MED ORDER — ROPIVACAINE HCL 5 MG/ML IJ SOLN
INTRAMUSCULAR | Status: AC
  Administered 2019-09-15: 5 mL via INTRA_ARTICULAR
  Filled 2019-09-15: qty 30

## 2019-09-15 NOTE — Progress Notes (Signed)
PROGRESS NOTE  Kaylee Hansen  DOB: Dec 06, 1955  PCP: Cassandria Anger, MD GYK:[id number]  DOA: 09/12/2019  Admitted From: Home  LOS: 3 days   Chief Complaint  Patient presents with  . Hip Pain  . Shortness of Breath   Brief narrative: Patient is a 64 y.o.femalewith PMH significant forCAD, chronic diastolic CHF, anxiety, COPD on intermittent O2 by nasal collar, hypothyroidism, and chronic right hip pain. She presented to the ED on 3/19 with uncontrolled right hip pain, increased shortness of breath, and increased cough. Patient was at home with her husband.  She apparently has remained bedbound for several years but she is able to transfer herself from bed to wheelchair.  Patient reports that her right hip has been hurting much more than usual for roughly a week now, unable to control the pain with her narcotic analgesics at home.she reportedly saw her orthopedist in the clinic on the day of admission with no acute findings on plain films, and was directed to the ED for pain control and further evaluation.  In the ED, patient was also found to have acute on chronic worsening of cough and dyspnea.  Oxygen saturation low 90s on 2 L. EKG features sinus rhythm with RBBB.  MRI right hip was performed and the patient developed acute onset of chest pain while in the scanner, resolved when she was taken out of the machine.  Husband reports that she has had similar episodes with MRI that were attributed to panic.  Chemistry panel with potassium 5.3 and CBC with mild normocytic anemia.  VBG showed hypoxia and Covid antigen test was negative.  Patient was admitted for further evaluation and management.  Subjective: Patient was seen and examined this morning.  Elderly Caucasian female, lying down in bed.  On 2.5 L oxygen by nasal cannula. She has wheezing with scattered rales on both sides.  Assessment/Plan: Intractable right hip pain secondary to avascular necrosis of the right  hip-patient -admitted from Ortho clinic by Dr. Alvan Dame.   -MRI right hip right hip avascular necrosis.  -Previous hospitalist discussed with Dr. Alma Friendly.  IR was consulted.  Patient underwent intra-articular injection of cortisone today. -Continue Dilaudid and OxyContin and Flexeril.  Acute on chronic hypoxic respiratory failure with hypoxia  COPD exacerbation  -Chest x-ray on admission was clean.  -Clinically patient however has wheezing as well as scattered rales.  -Currently on azithromycin, tapering course of IV steroids and bronchodilators.  Continue the same.   Atypical chest pain  -Patient had an episode of atypical chest pain while an MRI.  Previous history of same attributed to panic attack.   -Normal troponin. EKG with no acute changes.  hypothyroidism - continue Synthroid  Anxiety - continue Xanax and Paxil  Cardiovascular issues: HTN, chronic diastolic congestive heart failure  -home meds include Cardizem, Plavix, Lasix, nitrate.  Seizure disorder - continue Dilantin.  DVT prophylaxis:  Lovenox subcu Antimicrobials: azithromycin Fluid: none Diet: Cardiac diet  Code Status: Full code Mobility: bed-bound for last several years. Family Communication: husband at bedside. Discharge plan:  Anticipated date and disposition: Hopefully home in 1 to 2 days Barriers: Continues to have wheezing, rales.  Consultants:  IR, orthopedics  Antimicrobials: Anti-infectives (From admission, onward)   Start     Dose/Rate Route Frequency Ordered Stop   09/12/19 2245  azithromycin (ZITHROMAX) 500 mg in sodium chloride 0.9 % 250 mL IVPB     500 mg 250 mL/hr over 60 Minutes Intravenous Daily at bedtime 09/12/19 2220  Code Status: Full Code   Diet Order            Diet Heart Room service appropriate? Yes; Fluid consistency: Thin  Diet effective now              Infusions:  . sodium chloride Stopped (09/13/19 0153)  . azithromycin 500 mg (09/14/19 2141)     Scheduled Meds: . clopidogrel  75 mg Oral Daily  . cyclobenzaprine  5 mg Oral TID  . diltiazem  120 mg Oral Daily  . enoxaparin (LOVENOX) injection  40 mg Subcutaneous Daily  . furosemide  20 mg Oral Daily  . hydrOXYzine  10 mg Oral TID  . ipratropium-albuterol  3 mL Nebulization TID  . isosorbide mononitrate  30 mg Oral Daily  . levothyroxine  150 mcg Oral Daily  . lidocaine  1 patch Transdermal QHS  . methylPREDNISolone (SOLU-MEDROL) injection  60 mg Intravenous Q12H  . mometasone-formoterol  2 puff Inhalation BID  . nicotine  21 mg Transdermal Daily  . oxyCODONE  10 mg Oral Q12H  . pantoprazole  40 mg Oral Daily  . PARoxetine  40 mg Oral Daily  . phenytoin  100 mg Oral Daily  . phenytoin  200 mg Oral QHS  . rosuvastatin  40 mg Oral q1800  . sodium chloride flush  3 mL Intravenous Q12H  . sodium chloride flush  3 mL Intravenous Q12H    PRN meds: sodium chloride, acetaminophen, albuterol, ALPRAZolam, bisacodyl, HYDROcodone-acetaminophen, HYDROmorphone (DILAUDID) injection, ondansetron (ZOFRAN) IV, promethazine, senna-docusate, sodium chloride flush   Objective: Vitals:   09/15/19 0614 09/15/19 0736  BP: (!) 146/56   Pulse: 95   Resp: 18   Temp: 98.9 F (37.2 C)   SpO2: 92% 93%    Intake/Output Summary (Last 24 hours) at 09/15/2019 1504 Last data filed at 09/15/2019 1231 Gross per 24 hour  Intake 360 ml  Output 1200 ml  Net -840 ml   Filed Weights   09/12/19 1311  Weight: 74.4 kg   Weight change:  Body mass index is 27.29 kg/m.   Physical Exam: General exam: Not in acute distress Skin: No rashes, lesions or ulcers. HEENT: Atraumatic, normocephalic, supple neck, no obvious bleeding Lungs: Wheezing, scattered rales CVS: Regular rate and rhythm, no murmur GI/Abd soft, nontender, nondistended, bowel sound present CNS: Alert, awake, oriented x3 Psychiatry: Mood appropriate.  Seems anxious at times Extremities: No pedal edema, no calf tenderness  Data  Review: I have personally reviewed the laboratory data and studies available.  Recent Labs  Lab 09/12/19 1631 09/13/19 0422 09/15/19 0501  WBC 6.3 6.9 12.3*  NEUTROABS 4.4 6.2  --   HGB 11.4* 10.9* 10.0*  HCT 35.9* 34.0* 31.7*  MCV 97.0 95.8 97.8  PLT 262 250 268   Recent Labs  Lab 09/12/19 1631 09/13/19 0422 09/15/19 0501  NA 138 136 137  K 5.3* 4.9 5.2*  CL 102 101 100  CO2 28 25 27   GLUCOSE 99 123* 126*  BUN 12 12 21   CREATININE 0.94 0.78 0.97  CALCIUM 9.1 9.0 9.3   Signed, Terrilee Croak, MD [facility] Pager: [phone] (Secure Chat preferred). 09/15/2019

## 2019-09-15 NOTE — Procedures (Signed)
Right hip injection under fluoroscopy  Risks of infection/bleeding explained to patient. Patient on plavix. This was not held due to risk to patient. Time out prior to procedure, verifying patient name/DOB and the correct site of procedure.  80 mg Depomedrol and 5 mL 0.5% Sensorcaine injected into R hip joint after verifying needle placement.  No immediate complication.  [place] Pink, MD

## 2019-09-15 NOTE — Care Management Important Message (Signed)
Important Message  Patient Details IM Letter given to Evette Cristal SW Case Manager to present to the Patient Name: Kaylee Hansen MRN: [id number] Date of Birth: 1955/09/13   Medicare Important Message Given:  Yes     Kerin Salen 09/15/2019, 1:07 PM

## 2019-09-15 NOTE — Progress Notes (Signed)
Following the start of IV abx. Pt IV noted to begin leaking. RN attempt to start new IV, unsuccessful. IVT consult placed for difficult stick. - no successful IV start.   [place] on-call paged of such, care order placed for pt to be ok without IV until AM.  Will pass along to on-coming RN.  Pt in bed, resting in NAD. VSS.  Will continue to assess and monitor pt.

## 2019-09-16 ENCOUNTER — Telehealth: Payer: Self-pay | Admitting: *Deleted

## 2019-09-16 LAB — BASIC METABOLIC PANEL
Anion gap: 7 (ref 5–15)
BUN: 23 mg/dL (ref 8–23)
CO2: 29 mmol/L (ref 22–32)
Calcium: 8.9 mg/dL (ref 8.9–10.3)
Chloride: 97 mmol/L — ABNORMAL LOW (ref 98–111)
Creatinine, Ser: 0.92 mg/dL (ref 0.44–1.00)
GFR calc Af Amer: >60 mL/min (ref >60)
GFR calc non Af Amer: >60 mL/min (ref >60)
Glucose, Bld: 104 mg/dL — ABNORMAL HIGH (ref 70–99)
Potassium: 4.8 mmol/L (ref 3.5–5.1)
Sodium: 133 mmol/L — ABNORMAL LOW (ref 135–145)

## 2019-09-16 MED ORDER — PREDNISONE 10 MG PO TABS: ORAL_TABLET | ORAL | 0 refills | Status: DC

## 2019-09-16 MED ORDER — PREDNISONE 20 MG PO TABS
40.0000 mg | ORAL_TABLET | Freq: Two times a day (BID) | ORAL | Status: DC
  Administered 2019-09-16: 09:00:00 40 mg via ORAL
  Filled 2019-09-16: qty 2

## 2019-09-16 MED ORDER — CYCLOBENZAPRINE HCL 5 MG PO TABS: 5.0000 mg | ORAL_TABLET | Freq: Three times a day (TID) | ORAL | 0 refills | Status: AC

## 2019-09-16 MED ORDER — AZITHROMYCIN 250 MG PO TABS
500.0000 mg | ORAL_TABLET | Freq: Every day | ORAL | Status: DC
  Administered 2019-09-16: 09:00:00 500 mg via ORAL
  Filled 2019-09-16: qty 2

## 2019-09-16 MED ORDER — AZITHROMYCIN 500 MG PO TABS: 500.0000 mg | ORAL_TABLET | Freq: Every day | ORAL | 0 refills | Status: AC

## 2019-09-16 NOTE — TOC Transition Note (Signed)
Transition of Care [facility]) - CM/SW Discharge Note   Patient Details  Name: Kaylee Hansen MRN: [id number] Date of Birth: 08/29/1955  [facility] CM/SW Contact:  Ross Ludwig, LCSW Phone Number: 09/16/2019, 12:07 PM   Clinical Narrative:    Patient is refusing home health services, and does not want a portable oxygen tank.  CSW was informed that patient has chronic oxygen at home.  Bedside nurse reiterated for patient to follow up with her pulmonologist to get portable tanks if she changes her mind.  CSW signing off, no other needs.   Final next level of care: Home/Self Care Barriers to Discharge: Barriers Resolved   Patient Goals and CMS Choice Patient states their goals for this hospitalization and ongoing recovery are:: To return back home CMS Medicare.gov Compare Post Acute Care list provided to:: Patient Choice offered to / list presented to : Patient  Discharge Placement   Discharging back home.     Discharge Plan and Services                DME Arranged: N/A         HH Arranged: Refused [place] Agency: NA        Social Determinants of Health (SDOH) Interventions     Readmission Risk Interventions No flowsheet data found.

## 2019-09-16 NOTE — Progress Notes (Signed)
Pt states she does not have portable oxygen and she hasn't had it in "years" due to not seeing a pulmonologist. She was at [facility] and didn't like the MD so she stopped going. Educated and informed patient that is was important to keep all appointments with all doctors and that switching doctors was always an option.

## 2019-09-16 NOTE — Telephone Encounter (Signed)
Pt was on TCM report admiited 09/12/19 for  uncontrolled right hip pain, increased shortness of breath, and increased cough. MRI right hip was performed and the patient developed acute onset of chest pain while in the scanner, resolved when she was taken out of the machine. Patient underwent intra-articular injection of cortisone on 3/22.Marland Kitchenand -Continue home regimen of pain medicines. Pt D/C 09/16/19 and will f/u with orthopedic  Paralee Cancel, MD Follow up in 2 week(s).   Specialty: Orthopedic Surgery

## 2019-09-16 NOTE — Discharge Summary (Signed)
Physician Discharge Summary  EMILLIE CHASEN TFT:[id number] DOB: 1956/04/30 DOA: 09/12/2019  PCP: Cassandria Anger, MD  Admit date: 09/12/2019 Discharge date: 09/16/2019  Admitted From: Home Discharge disposition: Home with home health RN   Code Status: Full Code  Diet Recommendation: Cardiac diet   Recommendations for Outpatient Follow-Up:   1. Follow-up with PCP as an outpatient  Discharge Diagnosis:   Principal Problem:   COPD with acute exacerbation ([place]) Active Problems:   Hypothyroidism   Chest pain   Coronary artery disease involving native coronary artery of native heart with angina pectoris (HCC)   Intractable pain   Acute on chronic respiratory failure with hypoxia ([place])    History of Present Illness / Brief narrative:  Patient is a 64 y.o.femalewith PMH significant forCAD, chronic diastolic CHF, anxiety, COPD on intermittent O2 by nasal collar, hypothyroidism, and chronic right hip pain. She presented to the ED on 3/19 with uncontrolled right hip pain, increased shortness of breath, and increased cough. Patient was at home with her husband.  She apparently has remained bedbound for several years but she is able to transfer herself from bed to wheelchair.  Patient reports that her right hip has been hurting much more than usual for roughly a week now, unable to control the pain with her narcotic analgesics at home.she reportedly saw her orthopedist in the clinic on the day of admission with no acute findings on plain films, and was directed to the ED for pain control and further evaluation.  In the ED, patient was also found to have acute on chronic worsening of cough and dyspnea.  Oxygen saturation low 90s on 2 L. EKG features sinus rhythm with RBBB.  MRI right hip was performed and the patient developed acute onset of chest pain while in the scanner, resolved when she was taken out of the machine.  Husband reports that she has had similar episodes with MRI  that were attributed to panic.  Chemistry panel with potassium 5.3 and CBC with mild normocytic anemia.  VBG showed hypoxia and Covid antigen test was negative.  Patient was admitted for further evaluation and management.  Hospital Course:  Intractable right hip painsecondary to avascular necrosis of the right hip-patient -admitted from Ortho clinic by Dr. Alvan Dame.  -MRI right hip avascular necrosis.  -Previous hospitalist discussed with Dr. Alma Friendly.  IR was consulted.  Patient underwent intra-articular injection of cortisone on 3/22.. -Continue home regimen of pain medicines.  Acute on chronic hypoxic respiratory failure with hypoxia  COPD exacerbation  -Chest x-ray on admission was clean.  -Clinically patient however has wheezing as well as scattered rales.  -Currently on azithromycin, tapering course of IV steroids and bronchodilators. Post discharge, patient will take 3 more days of oral azithromycin, bronchodilators and a tapering course of oral steroids.  Atypical chest pain  -Patient had an episode of atypical chest pain while an MRI.  Previous history of same attributed to panic attack.   -Normal troponin. EKG with no acute changes.  hypothyroidism - continue Synthroid  Anxiety - continue Xanax and Paxil  Cardiovascular issues: HTN, chronic diastolic congestive heart failure  -home meds include Cardizem, Plavix, Lasix, nitrate.  Seizure disorder - continue Dilantin.  Stable for discharge home today.  Subjective:  Seen and examined this morning. Pleasant elderly Caucasian female. Lying down in bed. On 2 L oxygen by nasal cannula which is her chronic requirement. Feels ready to go home today.  Discharge Exam:   Vitals:   09/15/19 2007  09/15/19 2236 09/16/19 0409 09/16/19 0812  BP: 126/75  (!) 134/55   Pulse: 100  87   Resp: 20  18   Temp: 98 F (36.7 C)  98.3 F (36.8 C)   TempSrc: Oral  Oral   SpO2: 92% 94% 93% 96%  Weight:      Height:        Body mass  index is 27.29 kg/m.  General exam: Appears calm and comfortable.  Skin: No rashes, lesions or ulcers. HEENT: Atraumatic, normocephalic, supple neck, no obvious bleeding Lungs: Mild scattered rales. No cough on deep breathing today CVS: Regular rate and rhythm, no murmur GI/Abd soft, nontender, nondistended, bowel sound present CNS: Alert, awake, oriented x3 Psychiatry: Mood appropriate Extremities: No pedal edema, no calf tenderness  Discharge Instructions:  Wound care: None Discharge Instructions    Diet - low sodium heart healthy   Complete by: As directed    Increase activity slowly   Complete by: As directed    Partial weight bearing   Complete by: As directed    25% body weight, with supervision and with walker   Laterality: right   Extremity: Lower     Follow-up Information    Paralee Cancel, MD Follow up in 2 week(s).   Specialty: Orthopedic Surgery Why: call [phone] Contact information: [address] [place] [address] [phone]        Plotnikov, [name], MD Follow up.   Specialty: Internal Medicine Contact information: [place] [address] [phone]        Leonie Man, MD .   Specialty: Cardiology Contact information: [address] [place] [address] [phone]          Allergies as of 09/16/2019      Reactions   Morphine    "Makes me go crazy."    Nitrofuran Derivatives    ??confusion   Oxycodone Hcl Other (See Comments)   Knocked her out for 6 days   Penicillins Hives   Has patient had a PCN reaction causing immediate rash, facial/tongue/throat swelling, SOB or lightheadedness with hypotension: unknown Has patient had a PCN reaction causing severe rash involving mucus membranes or skin necrosis: unknown Has patient had a PCN reaction that required hospitalization No Has patient had a PCN reaction occurring within the last 10 years: No If all of the above answers  are "NO", then may proceed with Cephalosporin use.   Tape Other (See Comments)   Skin turns red and burns      Medication List    TAKE these medications   acetaminophen 325 MG tablet Commonly known as: TYLENOL Take 2 tablets (650 mg total) by mouth every 4 (four) hours as needed for headache or mild pain.   albuterol (2.5 MG/3ML) 0.083% nebulizer solution Commonly known as: PROVENTIL Take 3 mLs (2.5 mg total) by nebulization every 4 (four) hours as needed for wheezing or shortness of breath.   albuterol 108 (90 Base) MCG/ACT inhaler Commonly known as: VENTOLIN HFA Inhale 2 puffs into the lungs every 6 (six) hours as needed for wheezing or shortness of breath.   ALPRAZolam 0.25 MG tablet Commonly known as: XANAX TAKE 1 TABLET (0.25 MG TOTAL) BY MOUTH AT BEDTIME AS NEEDED FOR ANXIETY.   azithromycin 500 MG tablet Commonly known as: ZITHROMAX Take 1 tablet (500 mg total) by mouth daily for 3 days.   bisacodyl 5 MG EC tablet Commonly known as: Dulcolax Take 2 tablets (10  mg total) by mouth at bedtime. What changed:   when to take this  reasons to take this   budesonide-formoterol 160-4.5 MCG/ACT inhaler Commonly known as: SYMBICORT Inhale 2 puffs into the lungs 2 (two) times daily at 10 AM and 5 PM. Rinse mouth after each use   clopidogrel 75 MG tablet Commonly known as: PLAVIX TAKE 1 TABLET (75 MG TOTAL) BY MOUTH DAILY WITH BREAKFAST. What changed: when to take this   cyclobenzaprine 5 MG tablet Commonly known as: FLEXERIL Take 1 tablet (5 mg total) by mouth 3 (three) times daily for 7 days.   diltiazem 120 MG 24 hr capsule Commonly known as: CARDIZEM CD TAKE 1 CAPSULE BY MOUTH AT BEDTIME. What changed: when to take this   [name] As directed   furosemide 20 MG tablet Commonly known as: LASIX Take 1 tablet by mouth once daily   guaiFENesin 600 MG 12 hr tablet Commonly known as: Mucinex Take 1 tablet (600 mg total) by mouth 2 (two) times daily as  needed for cough or to loosen phlegm.   HYDROcodone-acetaminophen 10-325 MG tablet Commonly known as: NORCO Take 1 tablet by mouth every 6 (six) hours as needed for severe pain. Please fill on or after 07/18/19   hydroxypropyl methylcellulose / hypromellose 2.5 % ophthalmic solution Commonly known as: ISOPTO TEARS / GONIOVISC Place 1 drop into both eyes 3 (three) times daily as needed for dry eyes.   isosorbide mononitrate 30 MG 24 hr tablet Commonly known as: IMDUR Take 1 tablet by mouth once daily   ketoconazole 2 % cream Commonly known as: NIZORAL Apply 1 application topically daily.   levothyroxine 150 MCG tablet Commonly known as: SYNTHROID Take 1 tablet (150 mcg total) by mouth daily.   MELATONIN PO Take 1 tablet by mouth at bedtime as needed (sleep).   nitroGLYCERIN 0.4 MG SL tablet Commonly known as: NITROSTAT DISSOLVE ONE TABLET UNDER THE TONGUE EVERY 5 MINUTES AS NEEDED FOR CHEST PAIN.  DO NOT EXCEED A TOTAL OF 3 DOSES IN 15 MINUTES What changed: See the new instructions.   OXYGEN Inhale 2.5 L/min into the lungs at bedtime.   pantoprazole 40 MG tablet Commonly known as: PROTONIX TAKE 1 TABLET BY MOUTH DAILY BEFORE BREAKFAST What changed: See the new instructions.   PARoxetine 40 MG tablet Commonly known as: PAXIL Take 1 tablet (40 mg total) by mouth daily.   phenytoin 100 MG ER capsule Commonly known as: DILANTIN TAKE 100 MG EVERY MORNING AND 200 MG EVERY NIGHT. What changed: See the new instructions.   predniSONE 10 MG tablet Commonly known as: DELTASONE Take 4 tablets (40 mg) daily for 2 days, then, Take 3 tablets (30 mg) daily for 2 days, then, Take 2 tablets (20 mg) daily for 2 days, then, Take 1 tablets (10 mg) daily for 1 days, then stop   promethazine 25 MG tablet Commonly known as: PHENERGAN Take 1 tablet (25 mg total) by mouth every 8 (eight) hours as needed for nausea or vomiting.   rosuvastatin 40 MG tablet Commonly known as: CRESTOR TAKE 1  TABLET (40 MG TOTAL) BY MOUTH DAILY. DISCONTINUE 20 MG DOSE What changed: additional instructions   senna-docusate 8.6-50 MG tablet Commonly known as: Senokot S Take 2 tablets by mouth at bedtime.   SUMAtriptan 100 MG tablet Commonly known as: IMITREX TAKE 1 TABLET BY MOUTH EVERY 2 HOURS AS NEEDED FOR MIAGRAINE/HEADACHE What changed: See the new instructions.   traZODone 100 MG tablet Commonly known as:  DESYREL TAKE 1 TABLET BY MOUTH EVERYDAY AT BEDTIME What changed: See the new instructions.   Vitamin D (Ergocalciferol) 1.25 MG (50000 UNIT) Caps capsule Commonly known as: DRISDOL TAKE ONE CAPSULE BY MOUTH ONE TIME PER WEEK What changed: See the new instructions.   ZzzQuil 25 MG Caps Generic drug: diphenhydrAMINE HCl (Sleep) Take 50 mg by mouth at bedtime as needed (sleep).            Discharge Care Instructions  (From admission, onward)         Start     Ordered   09/14/19 0000  Partial weight bearing    Comments: 25% body weight, with supervision and with walker  Question Answer Comment  Laterality right   Extremity Lower      09/14/19 1755          Time coordinating discharge: 35 minutes  The results of significant diagnostics from this hospitalization (including imaging, microbiology, ancillary and laboratory) are listed below for reference.    Procedures and Diagnostic Studies:   DG Chest 2 View  Result Date: 09/12/2019 CLINICAL DATA:  Shortness of breath EXAM: CHEST - 2 VIEW COMPARISON:  04/21/2019, 10/03/2016 FINDINGS: No acute opacity or pleural effusion. Stable cardiomediastinal silhouette. No pneumothorax. Old bilateral rib fractures. IMPRESSION: No active cardiopulmonary disease. Electronically Signed   By: Donavan Foil M.D.   On: 09/12/2019 15:26   MR HIP RIGHT WO CONTRAST  Result Date: 09/13/2019 CLINICAL DATA:  Right hip pain, difficulty bearing weight EXAM: MR OF THE RIGHT HIP WITHOUT CONTRAST TECHNIQUE: Multiplanar, multisequence MR  imaging was performed. No intravenous contrast was administered. COMPARISON:  X-ray 09/12/2019, MRI 02/22/2006 FINDINGS: Technical note: Incomplete examination as only the large field-of-view coronal T1 and STIR sequences were obtained. Patient began to experience chest pain and the examination was discontinued. Within the posterior aspect of the right femoral head is a geographic area of peripherally low T1/T2 signal with central T1 and T2 hyperintensity compatible with avascular necrosis. Area approximately 2.1 x 1.4 cm (series 4, images 10-11; series 5, image 10). No surrounding bone marrow edema. No evidence of cortical collapse on the included images. No fracture identified. No right hip joint effusion. Susceptibility artifact from left total hip arthroplasty. No acute osseous abnormalities are evident within the limitations of this incomplete exam. Myotendinous structures appear grossly intact. No abnormal bursal fluid collection. IMPRESSION: 1. Limited, incomplete exam.  See above. 2. 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 3. No acute findings within the above limitations. Electronically Signed   By: Davina Poke D.O.   On: 09/13/2019 11:27   DG Hip Unilat  With Pelvis 2-3 Views Right  Result Date: 09/12/2019 CLINICAL DATA:  Right-sided hip pain EXAM: DG HIP (WITH OR WITHOUT PELVIS) 2-3V RIGHT COMPARISON:  06/14/2015 FINDINGS: Status post left hip replacement with intact hardware and normal alignment. Right hip shows no fracture or malalignment. Mild degenerative change of the right hip. IMPRESSION: Mild degenerative change of the right hip. No acute osseous abnormality. Status post left hip replacement with normal alignment Electronically Signed   By: Donavan Foil M.D.   On: 09/12/2019 15:25     Labs:   Basic Metabolic Panel: Recent Labs  Lab 09/12/19 1631 09/12/19 1631 09/13/19 0422 09/13/19 0422 09/15/19 0501 09/16/19 0810   NA 138  --  136  --  137 133*  K 5.3*   < > 4.9   < > 5.2* 4.8  CL  102  --  101  --  100 97*  CO2 28  --  25  --  27 29  GLUCOSE 99  --  123*  --  126* 104*  BUN 12  --  12  --  21 23  CREATININE 0.94  --  0.78  --  0.97 0.92  CALCIUM 9.1  --  9.0  --  9.3 8.9   < > = values in this interval not displayed.   GFR Estimated Creatinine Clearance: 61.6 mL/min (by C-G formula based on SCr of 0.92 mg/dL). Liver Function Tests: Recent Labs  Lab 09/15/19 0501  AST 20  ALT 20  ALKPHOS 87  BILITOT 0.1*  PROT 6.6  ALBUMIN 3.5   No results for input(s): LIPASE, AMYLASE in the last 168 hours. No results for input(s): AMMONIA in the last 168 hours. Coagulation profile No results for input(s): INR, PROTIME in the last 168 hours.  CBC: Recent Labs  Lab 09/12/19 1631 09/13/19 0422 09/15/19 0501  WBC 6.3 6.9 12.3*  NEUTROABS 4.4 6.2  --   HGB 11.4* 10.9* 10.0*  HCT 35.9* 34.0* 31.7*  MCV 97.0 95.8 97.8  PLT 262 250 268   Cardiac Enzymes: No results for input(s): CKTOTAL, CKMB, CKMBINDEX, TROPONINI in the last 168 hours. BNP: Invalid input(s): POCBNP CBG: No results for input(s): GLUCAP in the last 168 hours. D-Dimer No results for input(s): DDIMER in the last 72 hours. Hgb A1c No results for input(s): HGBA1C in the last 72 hours. Lipid Profile No results for input(s): CHOL, HDL, LDLCALC, TRIG, CHOLHDL, LDLDIRECT in the last 72 hours. Thyroid function studies No results for input(s): TSH, T4TOTAL, T3FREE, THYROIDAB in the last 72 hours.  Invalid input(s): FREET3 Anemia work up No results for input(s): VITAMINB12, FOLATE, FERRITIN, TIBC, IRON, RETICCTPCT in the last 72 hours. Microbiology Recent Results (from the past 240 hour(s))  SARS CORONAVIRUS 2 (TAT 6-24 HRS) Nasopharyngeal Nasopharyngeal Swab     Status: None   Collection Time: 09/12/19  8:32 PM   Specimen: Nasopharyngeal Swab  Result Value Ref Range Status   SARS Coronavirus 2 NEGATIVE NEGATIVE Final     Comment: (NOTE) SARS-CoV-2 target nucleic acids are NOT DETECTED. The SARS-CoV-2 RNA is generally detectable in upper and lower respiratory specimens during the acute phase of infection. Negative results do not preclude SARS-CoV-2 infection, do not rule out co-infections with other pathogens, and should not be used as the sole basis for treatment or other patient management decisions. Negative results must be combined with clinical observations, patient history, and epidemiological information. The expected result is Negative. Fact Sheet for Patients: SugarRoll.be Fact Sheet for Healthcare Providers: https://www.woods-mathews.com/ This test is not yet approved or cleared by the [place] FDA and  has been authorized for detection and/or diagnosis of SARS-CoV-2 by FDA under an Emergency Use Authorization (EUA). This EUA will remain  in effect (meaning this test can be used) for the duration of the COVID-19 declaration under Section 56 4(b)(1) of the Act, 21 U.S.C. section 360bbb-3(b)(1), unless the authorization is terminated or revoked sooner. Performed at [facility], [place] [address], [place], [place] [address]     Please note: You were cared for by a hospitalist during your hospital stay. Once you are discharged, your primary care physician will handle any further medical issues. Please note that NO REFILLS for any discharge medications will be authorized once you are discharged, as it is imperative that you return to your primary care physician (  or establish a relationship with a primary care physician if you do not have one) for your post hospital discharge needs so that they can reassess your need for medications and monitor your lab values.  Signed: Terrilee Croak  [facility] 09/16/2019, 10:28 AM

## 2019-09-17 ENCOUNTER — Other Ambulatory Visit: Payer: Self-pay

## 2019-09-17 DIAGNOSIS — C3490 Malignant neoplasm of unspecified part of unspecified bronchus or lung: Secondary | ICD-10-CM | POA: Diagnosis not present

## 2019-09-17 NOTE — Telephone Encounter (Signed)
MEDICATION: albuterol (PROVENTIL HFA;VENTOLIN HFA) 108 (90 Base) MCG/ACT inhaler (Expired)  senna-docusate (SENOKOT S) 8.6-50 MG tablet  albuterol (PROVENTIL) (2.5 MG/3ML) 0.083% nebulizer solution  [id number] - [place], [place] - [address]  PHARMACY:  [id number] - [place], [place] - [address]  IS THIS A 90 DAY SUPPLY :   IS PATIENT OUT OF MEDICATION:   IF NOT; HOW MUCH IS LEFT:   LAST APPOINTMENT DATE: @3 /23/2021  NEXT APPOINTMENT DATE:@4 /12/2019  DO WE HAVE YOUR PERMISSION TO LEAVE A DETAILED MESSAGE:  OTHER COMMENTS:    **Let patient know to contact pharmacy at the end of the day to make sure medication is ready. **  ** Please notify patient to allow 48-72 hours to process**  **Encourage patient to contact the pharmacy for refills or they can request refills through [facility]**

## 2019-09-18 DIAGNOSIS — R69 Illness, unspecified: Secondary | ICD-10-CM | POA: Diagnosis not present

## 2019-09-18 MED ORDER — ALBUTEROL SULFATE (2.5 MG/3ML) 0.083% IN NEBU: 2.5000 mg | INHALATION_SOLUTION | RESPIRATORY_TRACT | 5 refills | Status: DC | PRN

## 2019-09-18 NOTE — Telephone Encounter (Signed)
Please advise about Senokot, never prescribed by you

## 2019-09-21 MED ORDER — SENNOSIDES-DOCUSATE SODIUM 8.6-50 MG PO TABS: 2.0000 | ORAL_TABLET | Freq: Every day | ORAL | 11 refills | Status: DC

## 2019-09-22 ENCOUNTER — Emergency Department (HOSPITAL_COMMUNITY)
Admission: EM | Admit: 2019-09-22 | Discharge: 2019-09-22 | Disposition: A | Discharge status: Home / Self Care | Payer: Medicare HMO | Attending: Emergency Medicine | Admitting: Emergency Medicine

## 2019-09-22 ENCOUNTER — Emergency Department (HOSPITAL_COMMUNITY): Payer: Medicare HMO

## 2019-09-22 ENCOUNTER — Encounter (HOSPITAL_COMMUNITY): Payer: Self-pay | Admitting: Emergency Medicine

## 2019-09-22 ENCOUNTER — Other Ambulatory Visit: Payer: Self-pay | Admitting: *Deleted

## 2019-09-22 ENCOUNTER — Other Ambulatory Visit: Payer: Self-pay

## 2019-09-22 DIAGNOSIS — R079 Chest pain, unspecified: Secondary | ICD-10-CM | POA: Diagnosis not present

## 2019-09-22 DIAGNOSIS — Z5321 Procedure and treatment not carried out due to patient leaving prior to being seen by health care provider: Secondary | ICD-10-CM | POA: Insufficient documentation

## 2019-09-22 DIAGNOSIS — R110 Nausea: Secondary | ICD-10-CM | POA: Diagnosis not present

## 2019-09-22 DIAGNOSIS — R0789 Other chest pain: Secondary | ICD-10-CM | POA: Diagnosis not present

## 2019-09-22 DIAGNOSIS — B9689 Other specified bacterial agents as the cause of diseases classified elsewhere: Secondary | ICD-10-CM | POA: Diagnosis not present

## 2019-09-22 DIAGNOSIS — R911 Solitary pulmonary nodule: Secondary | ICD-10-CM | POA: Diagnosis not present

## 2019-09-22 DIAGNOSIS — N390 Urinary tract infection, site not specified: Secondary | ICD-10-CM | POA: Diagnosis not present

## 2019-09-22 DIAGNOSIS — R0602 Shortness of breath: Secondary | ICD-10-CM | POA: Diagnosis not present

## 2019-09-22 DIAGNOSIS — I959 Hypotension, unspecified: Secondary | ICD-10-CM | POA: Diagnosis not present

## 2019-09-22 DIAGNOSIS — R0902 Hypoxemia: Secondary | ICD-10-CM | POA: Diagnosis not present

## 2019-09-22 LAB — CBC
HCT: 35.8 % — ABNORMAL LOW (ref 36.0–46.0)
Hemoglobin: 11.2 g/dL — ABNORMAL LOW (ref 12.0–15.0)
MCH: 30.3 pg (ref 26.0–34.0)
MCHC: 31.3 g/dL (ref 30.0–36.0)
MCV: 96.8 fL (ref 80.0–100.0)
Platelets: 347 10*3/uL (ref 150–400)
RBC: 3.7 MIL/uL — ABNORMAL LOW (ref 3.87–5.11)
RDW: 14.3 % (ref 11.5–15.5)
WBC: 9.1 10*3/uL (ref 4.0–10.5)
nRBC: 0 % (ref 0.0–0.2)

## 2019-09-22 LAB — TROPONIN I (HIGH SENSITIVITY): Troponin I (High Sensitivity): 7 ng/L (ref <18)

## 2019-09-22 LAB — BASIC METABOLIC PANEL
Anion gap: 10 (ref 5–15)
BUN: 15 mg/dL (ref 8–23)
CO2: 27 mmol/L (ref 22–32)
Calcium: 8.7 mg/dL — ABNORMAL LOW (ref 8.9–10.3)
Chloride: 98 mmol/L (ref 98–111)
Creatinine, Ser: 0.95 mg/dL (ref 0.44–1.00)
GFR calc Af Amer: >60 mL/min (ref >60)
GFR calc non Af Amer: >60 mL/min (ref >60)
Glucose, Bld: 127 mg/dL — ABNORMAL HIGH (ref 70–99)
Potassium: 4.6 mmol/L (ref 3.5–5.1)
Sodium: 135 mmol/L (ref 135–145)

## 2019-09-22 LAB — PROTIME-INR
INR: 0.9 (ref 0.8–1.2)
Prothrombin Time: 12.1 seconds (ref 11.4–15.2)

## 2019-09-22 MED ORDER — SODIUM CHLORIDE 0.9% FLUSH: 3.0000 mL | Freq: Once | INTRAVENOUS | Status: DC

## 2019-09-22 NOTE — ED Triage Notes (Signed)
Patient arrived with EMS from home reports left chest pain with SOB onset this evening , no emesis or diaphoresis , she received ASA 324 mg and 2 NTG sl prior to arrival with slight relief . Denies fever or chills .

## 2019-09-22 NOTE — ED Notes (Signed)
Pt states that she wants to leave due to her hip hurting, offered pt a more comfortable chair, attempted to talk pt into staying, pt refuses and wants to call her husband, offered pain medication, pt states she takes dilaudid at home, explained I could not give her that in the waiting area. Pt states she still wants to leave and CP has resolved

## 2019-09-22 NOTE — Patient Outreach (Signed)
[place] [facility]) Care Management  09/22/2019  Kaylee Hansen [birth year] [id number]   RED ON EMMI ALERT - General Discharge Day # 4 Date: 3/28 Red Alert Reason: No follow up, Other questions/problems, and feeling sad/empty/hopeless/anxious   Outreach attempt #1, unsuccessful.  HIPAA compliant voice message left.   Plan: RN CM will send unsuccessful outreach letter and follow up within the next 3-4 business days.  Valente David, [place], MSN [employer] [phone]

## 2019-09-22 NOTE — ED Notes (Signed)
Pt left. 

## 2019-09-22 NOTE — ED Notes (Signed)
Pt assited to the restroom.  Bears weight but is very unsteady when standing and pivoting.  States that she uses motorized wheelchair and walker at home.  Pt states that it hurts her hip to sit in the chair and feels that she may not be able to wait in [place] for a long time.  States that she may call her husband and go home.  Sort RN notified.

## 2019-09-23 ENCOUNTER — Telehealth: Payer: Self-pay

## 2019-09-23 ENCOUNTER — Telehealth: Payer: Self-pay | Admitting: Cardiology

## 2019-09-23 DIAGNOSIS — I5032 Chronic diastolic (congestive) heart failure: Secondary | ICD-10-CM | POA: Diagnosis not present

## 2019-09-23 DIAGNOSIS — J9621 Acute and chronic respiratory failure with hypoxia: Secondary | ICD-10-CM | POA: Diagnosis not present

## 2019-09-23 DIAGNOSIS — R079 Chest pain, unspecified: Secondary | ICD-10-CM | POA: Diagnosis not present

## 2019-09-23 DIAGNOSIS — B9689 Other specified bacterial agents as the cause of diseases classified elsewhere: Secondary | ICD-10-CM | POA: Diagnosis not present

## 2019-09-23 DIAGNOSIS — K7200 Acute and subacute hepatic failure without coma: Secondary | ICD-10-CM | POA: Diagnosis not present

## 2019-09-23 DIAGNOSIS — R0789 Other chest pain: Secondary | ICD-10-CM | POA: Diagnosis not present

## 2019-09-23 DIAGNOSIS — R911 Solitary pulmonary nodule: Secondary | ICD-10-CM | POA: Diagnosis not present

## 2019-09-23 DIAGNOSIS — J189 Pneumonia, unspecified organism: Secondary | ICD-10-CM | POA: Diagnosis not present

## 2019-09-23 DIAGNOSIS — C7931 Secondary malignant neoplasm of brain: Secondary | ICD-10-CM | POA: Diagnosis not present

## 2019-09-23 DIAGNOSIS — R0602 Shortness of breath: Secondary | ICD-10-CM | POA: Diagnosis not present

## 2019-09-23 DIAGNOSIS — R0902 Hypoxemia: Secondary | ICD-10-CM | POA: Diagnosis not present

## 2019-09-23 DIAGNOSIS — G8929 Other chronic pain: Secondary | ICD-10-CM | POA: Diagnosis not present

## 2019-09-23 DIAGNOSIS — M87051 Idiopathic aseptic necrosis of right femur: Secondary | ICD-10-CM | POA: Diagnosis not present

## 2019-09-23 DIAGNOSIS — Z85118 Personal history of other malignant neoplasm of bronchus and lung: Secondary | ICD-10-CM | POA: Diagnosis not present

## 2019-09-23 DIAGNOSIS — N390 Urinary tract infection, site not specified: Secondary | ICD-10-CM | POA: Diagnosis not present

## 2019-09-23 DIAGNOSIS — J440 Chronic obstructive pulmonary disease with acute lower respiratory infection: Secondary | ICD-10-CM | POA: Diagnosis not present

## 2019-09-23 DIAGNOSIS — J441 Chronic obstructive pulmonary disease with (acute) exacerbation: Secondary | ICD-10-CM | POA: Diagnosis not present

## 2019-09-23 DIAGNOSIS — R69 Illness, unspecified: Secondary | ICD-10-CM | POA: Diagnosis not present

## 2019-09-23 DIAGNOSIS — C3490 Malignant neoplasm of unspecified part of unspecified bronchus or lung: Secondary | ICD-10-CM | POA: Diagnosis not present

## 2019-09-23 NOTE — Telephone Encounter (Signed)
Kaylee Hansen called complaining of CP and stated she was in to much pain to answer questions. I reached out to the NL triage and then transferred the call over.

## 2019-09-23 NOTE — Telephone Encounter (Signed)
Received a call from pt who is audibly SOB and wheezing. She also report chest hurt so bad. Nurse advise pt she need to report to ER asap based on symptoms. Pt state she refuse to go to [facility] and would rather go out in a coffin before she return. Nurse could hear pt gasping for air and again advised pt she need to report to ER. Nurse offered to contact EMS but pt state she would go to [facility] then hung up the line.

## 2019-09-24 DIAGNOSIS — R918 Other nonspecific abnormal finding of lung field: Secondary | ICD-10-CM | POA: Diagnosis not present

## 2019-09-24 DIAGNOSIS — J449 Chronic obstructive pulmonary disease, unspecified: Secondary | ICD-10-CM | POA: Diagnosis not present

## 2019-09-24 DIAGNOSIS — J9691 Respiratory failure, unspecified with hypoxia: Secondary | ICD-10-CM | POA: Diagnosis not present

## 2019-09-24 DIAGNOSIS — M87051 Idiopathic aseptic necrosis of right femur: Secondary | ICD-10-CM | POA: Diagnosis not present

## 2019-09-24 DIAGNOSIS — C7931 Secondary malignant neoplasm of brain: Secondary | ICD-10-CM | POA: Diagnosis not present

## 2019-09-24 DIAGNOSIS — C3490 Malignant neoplasm of unspecified part of unspecified bronchus or lung: Secondary | ICD-10-CM | POA: Diagnosis not present

## 2019-09-24 DIAGNOSIS — R079 Chest pain, unspecified: Secondary | ICD-10-CM | POA: Diagnosis not present

## 2019-09-25 ENCOUNTER — Other Ambulatory Visit: Payer: Self-pay | Admitting: *Deleted

## 2019-09-25 ENCOUNTER — Telehealth: Payer: Self-pay | Admitting: *Deleted

## 2019-09-25 DIAGNOSIS — J189 Pneumonia, unspecified organism: Secondary | ICD-10-CM | POA: Diagnosis not present

## 2019-09-25 DIAGNOSIS — J449 Chronic obstructive pulmonary disease, unspecified: Secondary | ICD-10-CM | POA: Diagnosis not present

## 2019-09-25 DIAGNOSIS — K7200 Acute and subacute hepatic failure without coma: Secondary | ICD-10-CM | POA: Diagnosis not present

## 2019-09-25 DIAGNOSIS — G8929 Other chronic pain: Secondary | ICD-10-CM | POA: Diagnosis not present

## 2019-09-25 DIAGNOSIS — J440 Chronic obstructive pulmonary disease with acute lower respiratory infection: Secondary | ICD-10-CM | POA: Diagnosis not present

## 2019-09-25 DIAGNOSIS — I5032 Chronic diastolic (congestive) heart failure: Secondary | ICD-10-CM | POA: Diagnosis not present

## 2019-09-25 DIAGNOSIS — J9691 Respiratory failure, unspecified with hypoxia: Secondary | ICD-10-CM | POA: Diagnosis not present

## 2019-09-25 DIAGNOSIS — Z85118 Personal history of other malignant neoplasm of bronchus and lung: Secondary | ICD-10-CM | POA: Diagnosis not present

## 2019-09-25 DIAGNOSIS — C3490 Malignant neoplasm of unspecified part of unspecified bronchus or lung: Secondary | ICD-10-CM | POA: Diagnosis not present

## 2019-09-25 DIAGNOSIS — J9621 Acute and chronic respiratory failure with hypoxia: Secondary | ICD-10-CM | POA: Diagnosis not present

## 2019-09-25 DIAGNOSIS — M87051 Idiopathic aseptic necrosis of right femur: Secondary | ICD-10-CM | POA: Diagnosis not present

## 2019-09-25 DIAGNOSIS — C7931 Secondary malignant neoplasm of brain: Secondary | ICD-10-CM | POA: Diagnosis not present

## 2019-09-25 DIAGNOSIS — R079 Chest pain, unspecified: Secondary | ICD-10-CM | POA: Diagnosis not present

## 2019-09-25 DIAGNOSIS — R918 Other nonspecific abnormal finding of lung field: Secondary | ICD-10-CM | POA: Diagnosis not present

## 2019-09-25 DIAGNOSIS — J441 Chronic obstructive pulmonary disease with (acute) exacerbation: Secondary | ICD-10-CM | POA: Diagnosis not present

## 2019-09-25 DIAGNOSIS — R69 Illness, unspecified: Secondary | ICD-10-CM | POA: Diagnosis not present

## 2019-09-25 NOTE — Patient Outreach (Addendum)
[place] [facility]) Care Management  09/25/2019  Kaylee Hansen 23-Oct-1955 [id number]   RED ON EMMI ALERT - General Discharge Day # 4 Date: 3/28 Red Alert Reason: No follow up, Other questions/problems, and feeling sad/empty/hopeless/anxious   Outreach attempt #2, unsuccessful. Female answering phone state member has readmitted back to the hospital, should be discharged tomorrow.  No [facility] admissions noted in chart however telephone encounter with cardiology office note that member stated she was going to [facility] to be evaluated for chest pain and shortness of breath.    Plan: RN CM will follow up within the next 3-4 business days.  Valente David, [place], MSN [place] [phone]

## 2019-09-25 NOTE — Telephone Encounter (Signed)
Call received from Dr. Pauletta Browns from [facility] to notify Dr. Marin Olp that patient is currently admitted at [facility] with pneumonia-hx: lung cancer, will be possibly discharged this weekend and that he would like Dr. Marin Olp to see patient in the next week or two. Dr. Marin Olp notified and message sent to scheduling.

## 2019-09-26 DIAGNOSIS — R079 Chest pain, unspecified: Secondary | ICD-10-CM | POA: Diagnosis not present

## 2019-09-26 DIAGNOSIS — J449 Chronic obstructive pulmonary disease, unspecified: Secondary | ICD-10-CM | POA: Diagnosis not present

## 2019-09-26 DIAGNOSIS — M87051 Idiopathic aseptic necrosis of right femur: Secondary | ICD-10-CM | POA: Diagnosis not present

## 2019-09-26 DIAGNOSIS — J9691 Respiratory failure, unspecified with hypoxia: Secondary | ICD-10-CM | POA: Diagnosis not present

## 2019-09-26 DIAGNOSIS — R918 Other nonspecific abnormal finding of lung field: Secondary | ICD-10-CM | POA: Diagnosis not present

## 2019-09-29 ENCOUNTER — Telehealth: Payer: Self-pay | Admitting: Internal Medicine

## 2019-09-29 NOTE — Telephone Encounter (Signed)
New message:   Dorian Pod is calling from [place] agency just to let us know that the patient just got out of the hospital this weekend and will be getting nurse visits this week.

## 2019-09-30 ENCOUNTER — Telehealth: Payer: Self-pay | Admitting: Internal Medicine

## 2019-09-30 ENCOUNTER — Inpatient Hospital Stay: Payer: Medicare HMO | Attending: Hematology & Oncology | Admitting: Hematology & Oncology

## 2019-09-30 ENCOUNTER — Encounter: Payer: Self-pay | Admitting: Hematology & Oncology

## 2019-09-30 ENCOUNTER — Inpatient Hospital Stay: Payer: Medicare HMO

## 2019-09-30 ENCOUNTER — Ambulatory Visit (HOSPITAL_BASED_OUTPATIENT_CLINIC_OR_DEPARTMENT_OTHER)
Admission: RE | Admit: 2019-09-30 | Discharge: 2019-09-30 | Disposition: A | Discharge status: Home / Self Care | Payer: Medicare HMO | Source: Ambulatory Visit | Attending: Hematology & Oncology | Admitting: Hematology & Oncology

## 2019-09-30 ENCOUNTER — Other Ambulatory Visit: Payer: Self-pay

## 2019-09-30 VITALS — BP 112/35 | HR 81 | Temp 97.8°F | Resp 18 | Wt 165.0 lb

## 2019-09-30 DIAGNOSIS — Z9981 Dependence on supplemental oxygen: Secondary | ICD-10-CM | POA: Diagnosis not present

## 2019-09-30 DIAGNOSIS — F17210 Nicotine dependence, cigarettes, uncomplicated: Secondary | ICD-10-CM | POA: Diagnosis not present

## 2019-09-30 DIAGNOSIS — I5032 Chronic diastolic (congestive) heart failure: Secondary | ICD-10-CM | POA: Diagnosis not present

## 2019-09-30 DIAGNOSIS — J189 Pneumonia, unspecified organism: Secondary | ICD-10-CM | POA: Diagnosis not present

## 2019-09-30 DIAGNOSIS — Z85118 Personal history of other malignant neoplasm of bronchus and lung: Secondary | ICD-10-CM | POA: Diagnosis not present

## 2019-09-30 DIAGNOSIS — R0789 Other chest pain: Secondary | ICD-10-CM | POA: Insufficient documentation

## 2019-09-30 DIAGNOSIS — J9621 Acute and chronic respiratory failure with hypoxia: Secondary | ICD-10-CM | POA: Diagnosis not present

## 2019-09-30 DIAGNOSIS — E611 Iron deficiency: Secondary | ICD-10-CM | POA: Diagnosis not present

## 2019-09-30 DIAGNOSIS — R69 Illness, unspecified: Secondary | ICD-10-CM | POA: Diagnosis not present

## 2019-09-30 DIAGNOSIS — M87051 Idiopathic aseptic necrosis of right femur: Secondary | ICD-10-CM | POA: Diagnosis not present

## 2019-09-30 DIAGNOSIS — R05 Cough: Secondary | ICD-10-CM | POA: Diagnosis not present

## 2019-09-30 DIAGNOSIS — I110 Hypertensive heart disease with heart failure: Secondary | ICD-10-CM | POA: Diagnosis not present

## 2019-09-30 DIAGNOSIS — M542 Cervicalgia: Secondary | ICD-10-CM | POA: Diagnosis not present

## 2019-09-30 DIAGNOSIS — R918 Other nonspecific abnormal finding of lung field: Secondary | ICD-10-CM | POA: Diagnosis not present

## 2019-09-30 DIAGNOSIS — J439 Emphysema, unspecified: Secondary | ICD-10-CM | POA: Diagnosis not present

## 2019-09-30 DIAGNOSIS — M25519 Pain in unspecified shoulder: Secondary | ICD-10-CM | POA: Diagnosis not present

## 2019-09-30 DIAGNOSIS — R911 Solitary pulmonary nodule: Secondary | ICD-10-CM | POA: Diagnosis present

## 2019-09-30 DIAGNOSIS — J441 Chronic obstructive pulmonary disease with (acute) exacerbation: Secondary | ICD-10-CM | POA: Diagnosis not present

## 2019-09-30 DIAGNOSIS — J440 Chronic obstructive pulmonary disease with acute lower respiratory infection: Secondary | ICD-10-CM | POA: Diagnosis not present

## 2019-09-30 DIAGNOSIS — M1990 Unspecified osteoarthritis, unspecified site: Secondary | ICD-10-CM | POA: Insufficient documentation

## 2019-09-30 LAB — CMP (CANCER CENTER ONLY)
ALT: 41 U/L (ref 0–44)
AST: 17 U/L (ref 15–41)
Albumin: 3.6 g/dL (ref 3.5–5.0)
Alkaline Phosphatase: 143 U/L — ABNORMAL HIGH (ref 38–126)
Anion gap: 5 (ref 5–15)
BUN: 15 mg/dL (ref 8–23)
CO2: 31 mmol/L (ref 22–32)
Calcium: 9.1 mg/dL (ref 8.9–10.3)
Chloride: 100 mmol/L (ref 98–111)
Creatinine: 1.15 mg/dL — ABNORMAL HIGH (ref 0.44–1.00)
GFR, Est AFR Am: 58 mL/min — ABNORMAL LOW (ref >60)
GFR, Estimated: 50 mL/min — ABNORMAL LOW (ref >60)
Glucose, Bld: 112 mg/dL — ABNORMAL HIGH (ref 70–99)
Potassium: 4.9 mmol/L (ref 3.5–5.1)
Sodium: 136 mmol/L (ref 135–145)
Total Bilirubin: 0.3 mg/dL (ref 0.3–1.2)
Total Protein: 6.3 g/dL — ABNORMAL LOW (ref 6.5–8.1)

## 2019-09-30 LAB — CBC WITH DIFFERENTIAL (CANCER CENTER ONLY)
Abs Immature Granulocytes: 0.14 10*3/uL — ABNORMAL HIGH (ref 0.00–0.07)
Basophils Absolute: 0.1 10*3/uL (ref 0.0–0.1)
Basophils Relative: 1 %
Eosinophils Absolute: 0.1 10*3/uL (ref 0.0–0.5)
Eosinophils Relative: 2 %
HCT: 35 % — ABNORMAL LOW (ref 36.0–46.0)
Hemoglobin: 11 g/dL — ABNORMAL LOW (ref 12.0–15.0)
Immature Granulocytes: 2 %
Lymphocytes Relative: 17 %
Lymphs Abs: 1.6 10*3/uL (ref 0.7–4.0)
MCH: 30.3 pg (ref 26.0–34.0)
MCHC: 31.4 g/dL (ref 30.0–36.0)
MCV: 96.4 fL (ref 80.0–100.0)
Monocytes Absolute: 0.6 10*3/uL (ref 0.1–1.0)
Monocytes Relative: 6 %
Neutro Abs: 6.9 10*3/uL (ref 1.7–7.7)
Neutrophils Relative %: 72 %
Platelet Count: 255 10*3/uL (ref 150–400)
RBC: 3.63 MIL/uL — ABNORMAL LOW (ref 3.87–5.11)
RDW: 14.4 % (ref 11.5–15.5)
WBC Count: 9.4 10*3/uL (ref 4.0–10.5)
nRBC: 0 % (ref 0.0–0.2)

## 2019-09-30 LAB — RETICULOCYTES
Immature Retic Fract: 12.9 % (ref 2.3–15.9)
RBC.: 3.6 MIL/uL — ABNORMAL LOW (ref 3.87–5.11)
Retic Count, Absolute: 59.8 10*3/uL (ref 19.0–186.0)
Retic Ct Pct: 1.7 % (ref 0.4–3.1)

## 2019-09-30 LAB — IRON AND TIBC
Iron: 45 ug/dL (ref 41–142)
Saturation Ratios: 15 % — ABNORMAL LOW (ref 21–57)
TIBC: 298 ug/dL (ref 236–444)
UIBC: 252 ug/dL (ref 120–384)

## 2019-09-30 LAB — FERRITIN: Ferritin: 65 ng/mL (ref 11–307)

## 2019-09-30 LAB — SAVE SMEAR(SSMR), FOR PROVIDER SLIDE REVIEW

## 2019-09-30 LAB — LACTATE DEHYDROGENASE: LDH: 144 U/L (ref 98–192)

## 2019-09-30 NOTE — Progress Notes (Signed)
Hematology and Oncology Follow Up Visit  NANDIKA STETZER [id number] 1956/03/09 64 y.o. 09/30/2019   Principle Diagnosis:   Questionable lung nodule-history of tobacco use  Current Therapy:    Observation     Interim History:  Ms. Bahena is back for a long, long awaited follow-up.  I think last time we saw her had to be 10 or more years ago.  Is unclear at all as to what is going on.  She has been in the ER recently.  She was at the ER at [facility].  She had a chest x-ray done on 29 March which is reported as clear.  Then she had a CT scan done at [facility] a couple days later.  Unfortunately, I cannot get access to this.  She was told that she had a spot that was lung cancer that she had to see me.  She cannot tell me where the spot on the lung is.  She says that she has little bit of a cough.  She says she coughs up some blood on occasion.  She still smokes.  She probably smokes half pack a day.  She is on oxygen.  She has bad arthritis.  She is in a wheelchair right now.  She has a hard time walking because of arthritis.  She has had no weight loss.  There is some chest wall pain.  There is no obvious cardiac issues.  She has seen a pulmonologist in the past but she did not like him.  I am somewhat surprised that she would be told to come to see me since we do not know that she even has cancer.  I am trying to get a CT scan on her today to see if I can see if there is a spot that might be a problem.  If there is a spot on her lung, then we will have to get her to a pulmonologist for an evaluation.  Given her current clinical state, I doubt that she would be a candidate for any surgery.  Overall, I would say her performance status is probably ECOG 3.  Medications:  Current Outpatient Medications:  .  diltiazem (TIAZAC) 120 MG 24 hr capsule, Take 120 mg by mouth., Disp: , Rfl:  .  acetaminophen (TYLENOL) 325 MG tablet, Take 2 tablets (650 mg total) by mouth  every 4 (four) hours as needed for headache or mild pain. (Patient not taking: Reported on 09/12/2019), Disp: , Rfl:  .  albuterol (PROVENTIL HFA;VENTOLIN HFA) 108 (90 Base) MCG/ACT inhaler, Inhale 2 puffs into the lungs every 6 (six) hours as needed for wheezing or shortness of breath., Disp: 1 Inhaler, Rfl: 5 .  albuterol (PROVENTIL) (2.5 MG/3ML) 0.083% nebulizer solution, Take 3 mLs (2.5 mg total) by nebulization every 4 (four) hours as needed for wheezing or shortness of breath., Disp: 150 mL, Rfl: 5 .  ALPRAZolam (XANAX) 0.25 MG tablet, TAKE 1 TABLET (0.25 MG TOTAL) BY MOUTH AT BEDTIME AS NEEDED FOR ANXIETY., Disp: 90 tablet, Rfl: 1 .  bisacodyl (DULCOLAX) 5 MG EC tablet, Take 2 tablets (10 mg total) by mouth at bedtime. (Patient taking differently: Take 10 mg by mouth daily as needed for moderate constipation. ), Disp: 30 tablet, Rfl:  .  budesonide-formoterol (SYMBICORT) 160-4.5 MCG/ACT inhaler, Inhale 2 puffs into the lungs 2 (two) times daily at 10 AM and 5 PM. Rinse mouth after each use, Disp: 1 Inhaler, Rfl: 0 .  clopidogrel (PLAVIX) 75 MG tablet,  TAKE 1 TABLET (75 MG TOTAL) BY MOUTH DAILY WITH BREAKFAST. (Patient taking differently: Take 75 mg by mouth daily. ), Disp: 90 tablet, Rfl: 3 .  diltiazem (CARDIZEM CD) 120 MG 24 hr capsule, TAKE 1 CAPSULE BY MOUTH AT BEDTIME. (Patient taking differently: Take 120 mg by mouth daily. ), Disp: 90 capsule, Rfl: 2 .  diphenhydrAMINE HCl, Sleep, (ZZZQUIL) 25 MG CAPS, Take 50 mg by mouth at bedtime as needed (sleep)., Disp: , Rfl:  .  furosemide (LASIX) 20 MG tablet, Take 1 tablet by mouth once daily (Patient taking differently: Take 20 mg by mouth daily. ), Disp: 90 tablet, Rfl: 2 .  guaiFENesin (MUCINEX) 600 MG 12 hr tablet, Take 1 tablet (600 mg total) by mouth 2 (two) times daily as needed for cough or to loosen phlegm. (Patient not taking: Reported on 09/12/2019), Disp: 14 tablet, Rfl: 0 .  HYDROcodone-acetaminophen (NORCO) 10-325 MG tablet, Take 1  tablet by mouth every 6 (six) hours as needed for severe pain. Please fill on or after 07/18/19, Disp: 120 tablet, Rfl: 0 .  hydroxypropyl methylcellulose / hypromellose (ISOPTO TEARS / GONIOVISC) 2.5 % ophthalmic solution, Place 1 drop into both eyes 3 (three) times daily as needed for dry eyes., Disp: , Rfl:  .  isosorbide mononitrate (IMDUR) 30 MG 24 hr tablet, Take 1 tablet by mouth once daily (Patient taking differently: Take 30 mg by mouth daily. ), Disp: 90 tablet, Rfl: 1 .  ketoconazole (NIZORAL) 2 % cream, Apply 1 application topically daily., Disp: 45 g, Rfl: 1 .  ketorolac (TORADOL) 10 MG tablet, Take 10 mg by mouth every 6 (six) hours as needed., Disp: , Rfl:  .  levofloxacin (LEVAQUIN) 500 MG tablet, Take 500 mg by mouth daily., Disp: , Rfl:  .  levothyroxine (SYNTHROID) 150 MCG tablet, Take 1 tablet (150 mcg total) by mouth daily., Disp: 90 tablet, Rfl: 3 .  MELATONIN PO, Take 1 tablet by mouth at bedtime as needed (sleep)., Disp: , Rfl:  .  nitroGLYCERIN (NITROSTAT) 0.4 MG SL tablet, DISSOLVE ONE TABLET UNDER THE TONGUE EVERY 5 MINUTES AS NEEDED FOR CHEST PAIN.  DO NOT EXCEED A TOTAL OF 3 DOSES IN 15 MINUTES (Patient taking differently: Place 0.4 mg under the tongue every 5 (five) minutes as needed for chest pain. ), Disp: 25 tablet, Rfl: 0 .  OXYGEN, Inhale 2.5 L/min into the lungs at bedtime., Disp: , Rfl:  .  pantoprazole (PROTONIX) 40 MG tablet, TAKE 1 TABLET BY MOUTH DAILY BEFORE BREAKFAST (Patient taking differently: Take 40 mg by mouth daily. ), Disp: 90 tablet, Rfl: 3 .  PARoxetine (PAXIL) 40 MG tablet, Take 1 tablet (40 mg total) by mouth daily., Disp: 90 tablet, Rfl: 3 .  phenytoin (DILANTIN) 100 MG ER capsule, TAKE 100 MG EVERY MORNING AND 200 MG EVERY NIGHT. (Patient taking differently: Take 100-200 mg by mouth See admin instructions. TAKE 100 MG EVERY MORNING AND 200 MG EVERY NIGHT.), Disp: 270 capsule, Rfl: 3 .  predniSONE (DELTASONE) 10 MG tablet, Take 4 tablets (40 mg)  daily for 2 days, then, Take 3 tablets (30 mg) daily for 2 days, then, Take 2 tablets (20 mg) daily for 2 days, then, Take 1 tablets (10 mg) daily for 1 days, then stop, Disp: 19 tablet, Rfl: 0 .  promethazine (PHENERGAN) 25 MG tablet, Take 1 tablet (25 mg total) by mouth every 8 (eight) hours as needed for nausea or vomiting., Disp: 90 tablet, Rfl: 1 .  Respiratory Therapy Supplies (  FLUTTER) DEVI, As directed, Disp: 1 each, Rfl: 0 .  rosuvastatin (CRESTOR) 40 MG tablet, TAKE 1 TABLET (40 MG TOTAL) BY MOUTH DAILY. DISCONTINUE 20 MG DOSE (Patient taking differently: Take 40 mg by mouth daily. ), Disp: 90 tablet, Rfl: 2 .  senna-docusate (SENOKOT S) 8.6-50 MG tablet, Take 2 tablets by mouth at bedtime., Disp: 60 tablet, Rfl: 11 .  SUMAtriptan (IMITREX) 100 MG tablet, TAKE 1 TABLET BY MOUTH EVERY 2 HOURS AS NEEDED FOR MIAGRAINE/HEADACHE (Patient taking differently: Take 100 mg by mouth every 2 (two) hours as needed for migraine or headache. ), Disp: 9 tablet, Rfl: 0 .  traZODone (DESYREL) 100 MG tablet, TAKE 1 TABLET BY MOUTH EVERYDAY AT BEDTIME (Patient taking differently: Take 100 mg by mouth at bedtime as needed for sleep. ), Disp: 90 tablet, Rfl: 3 .  Vitamin D, Ergocalciferol, (DRISDOL) 1.25 MG (50000 UT) CAPS capsule, TAKE ONE CAPSULE BY MOUTH ONE TIME PER WEEK (Patient taking differently: Take 50,000 Units by mouth every Monday. ), Disp: 12 capsule, Rfl: 3  Allergies:  Allergies  Allergen Reactions  . Morphine     "Makes me go crazy."   . Nitrofuran Derivatives     ??confusion  . Oxycodone Hcl Other (See Comments)    Knocked her out for 6 days  . Penicillins Hives    Has patient had a PCN reaction causing immediate rash, facial/tongue/throat swelling, SOB or lightheadedness with hypotension: unknown Has patient had a PCN reaction causing severe rash involving mucus membranes or skin necrosis: unknown Has patient had a PCN reaction that required hospitalization No Has patient had a PCN  reaction occurring within the last 10 years: No If all of the above answers are "NO", then may proceed with Cephalosporin use.  Other reaction(s): Rash-Generalized  . Tape Other (See Comments)    Skin turns red and burns    Past Medical History, Surgical history, Social history, and Family History were reviewed and updated.  Review of Systems: Review of Systems  Constitutional: Positive for fatigue.  HENT:  Negative.   Eyes: Negative.   Respiratory: Positive for cough.   Cardiovascular: Positive for chest pain.  Gastrointestinal: Negative.   Endocrine: Negative.   Genitourinary: Negative.    Musculoskeletal: Positive for arthralgias and myalgias.  Skin: Negative.   Neurological: Negative.   Hematological: Negative.   Psychiatric/Behavioral: Negative.     Physical Exam:  weight is 165 lb (74.8 kg). Her temporal temperature is 97.8 F (36.6 C). Her blood pressure is 112/35 (abnormal) and her pulse is 81. Her respiration is 18 and oxygen saturation is 97%.   Wt Readings from Last 3 Encounters:  09/30/19 165 lb (74.8 kg)  09/22/19 165 lb 5.5 oz (75 kg)  09/12/19 164 lb (74.4 kg)    Physical Exam Vitals reviewed.  HENT:     Head: Normocephalic and atraumatic.  Eyes:     Pupils: Pupils are equal, round, and reactive to light.  Cardiovascular:     Rate and Rhythm: Normal rate and regular rhythm.     Heart sounds: Normal heart sounds.     Comments: Cardiac exam shows a regular rate and rhythm.  She has no murmurs, rubs or bruits. Pulmonary:     Effort: Pulmonary effort is normal.     Breath sounds: Normal breath sounds.     Comments: Pulmonary exam shows scattered rhonchi bilaterally.  She has some wheezes.  She has a decent air movement. Abdominal:     General: Bowel sounds are  normal.     Palpations: Abdomen is soft.  Musculoskeletal:        General: No tenderness or deformity. Normal range of motion.     Cervical back: Normal range of motion.  Lymphadenopathy:      Cervical: No cervical adenopathy.  Skin:    General: Skin is warm and dry.     Findings: No erythema or rash.     Comments: Skin exam shows scattered ecchymoses.  Neurological:     Mental Status: She is alert and oriented to person, place, and time.  Psychiatric:        Behavior: Behavior normal.        Thought Content: Thought content normal.        Judgment: Judgment normal.      Lab Results  Component Value Date   WBC 9.4 09/30/2019   HGB 11.0 (L) 09/30/2019   HCT 35.0 (L) 09/30/2019   MCV 96.4 09/30/2019   PLT 255 09/30/2019     Chemistry      Component Value Date/Time   NA 136 09/30/2019 1021   NA 144 07/08/2018 1059   K 4.9 09/30/2019 1021   CL 100 09/30/2019 1021   CO2 31 09/30/2019 1021   BUN 15 09/30/2019 1021   BUN 12 07/08/2018 1059   CREATININE 1.15 (H) 09/30/2019 1021      Component Value Date/Time   CALCIUM 9.1 09/30/2019 1021   ALKPHOS 143 (H) 09/30/2019 1021   AST 17 09/30/2019 1021   ALT 41 09/30/2019 1021   BILITOT 0.3 09/30/2019 1021       Impression and Plan: Ms. Chiem is a 64 year old white female.  Again, I am not sure exactly what is going on.  I think a CT scan will clearly help [place] out.  We will try to get 1 set up for her here to be done.  Again if we find that there is a suspicious spot on her lung, then I will just get her to pulmonary medicine and see if they need to do a bronchoscopy or what they would recommend.  Again I doubt that she is a surgical candidate.  If there is any suspicion of lung cancer, then stereotactic radiosurgery might be the way to go.  Again have not seen Ms. Harriger for a lot of years.  I feel bad that she is not in the wheelchair.  I know she has bad arthritis.  This really is what is determining her quality of life.   [name], MD 4/6/202111:29 AM

## 2019-09-30 NOTE — Telephone Encounter (Signed)
New message:    Morey Hummingbird calling from [facility] to get verbal orders for nurse visits due to the patient recently getting out of the hospital with Respiratory failure/Pnuemonia. She would like 1 week 1/2wk 2/ 1wk 2 and 2 month 1. She would also like orders for a PT eval and a home health aid. Please advise.

## 2019-10-01 ENCOUNTER — Telehealth: Payer: Self-pay | Admitting: Hematology & Oncology

## 2019-10-01 ENCOUNTER — Inpatient Hospital Stay (HOSPITAL_BASED_OUTPATIENT_CLINIC_OR_DEPARTMENT_OTHER): Payer: Medicare HMO | Admitting: Hematology & Oncology

## 2019-10-01 ENCOUNTER — Ambulatory Visit (INDEPENDENT_AMBULATORY_CARE_PROVIDER_SITE_OTHER): Payer: Medicare HMO | Admitting: Internal Medicine

## 2019-10-01 ENCOUNTER — Encounter: Payer: Self-pay | Admitting: Internal Medicine

## 2019-10-01 ENCOUNTER — Encounter: Payer: Self-pay | Admitting: Hematology & Oncology

## 2019-10-01 ENCOUNTER — Other Ambulatory Visit: Payer: Self-pay | Admitting: *Deleted

## 2019-10-01 DIAGNOSIS — R69 Illness, unspecified: Secondary | ICD-10-CM | POA: Diagnosis not present

## 2019-10-01 DIAGNOSIS — R300 Dysuria: Secondary | ICD-10-CM

## 2019-10-01 DIAGNOSIS — C3490 Malignant neoplasm of unspecified part of unspecified bronchus or lung: Secondary | ICD-10-CM

## 2019-10-01 DIAGNOSIS — F411 Generalized anxiety disorder: Secondary | ICD-10-CM

## 2019-10-01 DIAGNOSIS — E034 Atrophy of thyroid (acquired): Secondary | ICD-10-CM

## 2019-10-01 DIAGNOSIS — R0789 Other chest pain: Secondary | ICD-10-CM | POA: Diagnosis not present

## 2019-10-01 DIAGNOSIS — G8929 Other chronic pain: Secondary | ICD-10-CM | POA: Diagnosis not present

## 2019-10-01 DIAGNOSIS — E611 Iron deficiency: Secondary | ICD-10-CM | POA: Diagnosis not present

## 2019-10-01 DIAGNOSIS — D500 Iron deficiency anemia secondary to blood loss (chronic): Secondary | ICD-10-CM

## 2019-10-01 DIAGNOSIS — R05 Cough: Secondary | ICD-10-CM | POA: Diagnosis not present

## 2019-10-01 DIAGNOSIS — R911 Solitary pulmonary nodule: Secondary | ICD-10-CM | POA: Diagnosis not present

## 2019-10-01 DIAGNOSIS — M5440 Lumbago with sciatica, unspecified side: Secondary | ICD-10-CM | POA: Diagnosis not present

## 2019-10-01 DIAGNOSIS — Z9981 Dependence on supplemental oxygen: Secondary | ICD-10-CM | POA: Diagnosis not present

## 2019-10-01 DIAGNOSIS — F17210 Nicotine dependence, cigarettes, uncomplicated: Secondary | ICD-10-CM | POA: Diagnosis not present

## 2019-10-01 DIAGNOSIS — M1990 Unspecified osteoarthritis, unspecified site: Secondary | ICD-10-CM | POA: Diagnosis not present

## 2019-10-01 DIAGNOSIS — R269 Unspecified abnormalities of gait and mobility: Secondary | ICD-10-CM | POA: Diagnosis not present

## 2019-10-01 HISTORY — DX: Iron deficiency anemia secondary to blood loss (chronic): D50.0

## 2019-10-01 MED ORDER — HYDROCODONE-ACETAMINOPHEN 10-325 MG PO TABS: 1.0000 | ORAL_TABLET | Freq: Four times a day (QID) | ORAL | 0 refills | Status: DC | PRN

## 2019-10-01 MED ORDER — SUMATRIPTAN SUCCINATE 100 MG PO TABS: ORAL_TABLET | ORAL | 3 refills | Status: DC

## 2019-10-01 MED ORDER — ALPRAZOLAM 0.25 MG PO TABS: 0.2500 mg | ORAL_TABLET | Freq: Every evening | ORAL | 1 refills | Status: DC | PRN

## 2019-10-01 MED ORDER — CIPROFLOXACIN HCL 250 MG PO TABS: 250.0000 mg | ORAL_TABLET | Freq: Two times a day (BID) | ORAL | 0 refills | Status: DC

## 2019-10-01 MED ORDER — PROMETHAZINE HCL 25 MG PO TABS: 25.0000 mg | ORAL_TABLET | Freq: Three times a day (TID) | ORAL | 1 refills | Status: DC | PRN

## 2019-10-01 NOTE — Progress Notes (Signed)
Subjective:  Patient ID: Kaylee Hansen, female    DOB: [birth year]  Age: 64 y.o. MRN: [id number]  CC: No chief complaint on file.   HPI Kaylee Hansen presents for a f/u on her chronic LBP, HAs, lung cancer - a new relapse C/o UTI sx's    CT from 4/6: 3.1 cm predominantly solid mass in the lingula, as described above, compatible with primary bronchogenic neoplasm.   Outpatient Medications Prior to Visit  Medication Sig Dispense Refill  . acetaminophen (TYLENOL) 325 MG tablet Take 2 tablets (650 mg total) by mouth every 4 (four) hours as needed for headache or mild pain.    [name] albuterol (PROVENTIL) (2.5 MG/3ML) 0.083% nebulizer solution Take 3 mLs (2.5 mg total) by nebulization every 4 (four) hours as needed for wheezing or shortness of breath. 150 mL 5  . ALPRAZolam (XANAX) 0.25 MG tablet TAKE 1 TABLET (0.25 MG TOTAL) BY MOUTH AT BEDTIME AS NEEDED FOR ANXIETY. 90 tablet 1  . bisacodyl (DULCOLAX) 5 MG EC tablet Take 2 tablets (10 mg total) by mouth at bedtime. (Patient taking differently: Take 10 mg by mouth daily as needed for moderate constipation. ) 30 tablet   . budesonide-formoterol (SYMBICORT) 160-4.5 MCG/ACT inhaler Inhale 2 puffs into the lungs 2 (two) times daily at 10 AM and 5 PM. Rinse mouth after each use 1 Inhaler 0  . clopidogrel (PLAVIX) 75 MG tablet TAKE 1 TABLET (75 MG TOTAL) BY MOUTH DAILY WITH BREAKFAST. (Patient taking differently: Take 75 mg by mouth daily. ) 90 tablet 3  . diltiazem (CARDIZEM CD) 120 MG 24 hr capsule TAKE 1 CAPSULE BY MOUTH AT BEDTIME. (Patient taking differently: Take 120 mg by mouth daily. ) 90 capsule 2  . diltiazem (TIAZAC) 120 MG 24 hr capsule Take 120 mg by mouth.    . diphenhydrAMINE HCl, Sleep, (ZZZQUIL) 25 MG CAPS Take 50 mg by mouth at bedtime as needed (sleep).    . furosemide (LASIX) 20 MG tablet Take 1 tablet by mouth once daily (Patient taking differently: Take 20 mg by mouth daily. ) 90 tablet 2  . guaiFENesin (MUCINEX) 600 MG 12 hr  tablet Take 1 tablet (600 mg total) by mouth 2 (two) times daily as needed for cough or to loosen phlegm. 14 tablet 0  . HYDROcodone-acetaminophen (NORCO) 10-325 MG tablet Take 1 tablet by mouth every 6 (six) hours as needed for severe pain. Please fill on or after 07/18/19 120 tablet 0  . hydroxypropyl methylcellulose / hypromellose (ISOPTO TEARS / GONIOVISC) 2.5 % ophthalmic solution Place 1 drop into both eyes 3 (three) times daily as needed for dry eyes.    . isosorbide mononitrate (IMDUR) 30 MG 24 hr tablet Take 1 tablet by mouth once daily (Patient taking differently: Take 30 mg by mouth daily. ) 90 tablet 1  . ketoconazole (NIZORAL) 2 % cream Apply 1 application topically daily. 45 g 1  . ketorolac (TORADOL) 10 MG tablet Take 10 mg by mouth every 6 (six) hours as needed.    [name] levofloxacin (LEVAQUIN) 500 MG tablet Take 500 mg by mouth daily.    [name] levothyroxine (SYNTHROID) 150 MCG tablet Take 1 tablet (150 mcg total) by mouth daily. 90 tablet 3  . MELATONIN PO Take 1 tablet by mouth at bedtime as needed (sleep).    . nitroGLYCERIN (NITROSTAT) 0.4 MG SL tablet DISSOLVE ONE TABLET UNDER THE TONGUE EVERY 5 MINUTES AS NEEDED FOR CHEST PAIN.  DO NOT EXCEED A TOTAL OF 3  DOSES IN 15 MINUTES (Patient taking differently: Place 0.4 mg under the tongue every 5 (five) minutes as needed for chest pain. ) 25 tablet 0  . OXYGEN Inhale 2.5 L/min into the lungs at bedtime.    . pantoprazole (PROTONIX) 40 MG tablet TAKE 1 TABLET BY MOUTH DAILY BEFORE BREAKFAST (Patient taking differently: Take 40 mg by mouth daily. ) 90 tablet 3  . PARoxetine (PAXIL) 40 MG tablet Take 1 tablet (40 mg total) by mouth daily. 90 tablet 3  . phenytoin (DILANTIN) 100 MG ER capsule TAKE 100 MG EVERY MORNING AND 200 MG EVERY NIGHT. (Patient taking differently: Take 100-200 mg by mouth See admin instructions. TAKE 100 MG EVERY MORNING AND 200 MG EVERY NIGHT.) 270 capsule 3  . predniSONE (DELTASONE) 10 MG tablet Take 4 tablets (40 mg) daily  for 2 days, then, Take 3 tablets (30 mg) daily for 2 days, then, Take 2 tablets (20 mg) daily for 2 days, then, Take 1 tablets (10 mg) daily for 1 days, then stop 19 tablet 0  . promethazine (PHENERGAN) 25 MG tablet Take 1 tablet (25 mg total) by mouth every 8 (eight) hours as needed for nausea or vomiting. 90 tablet 1  . Respiratory Therapy Supplies (FLUTTER) DEVI As directed 1 each 0  . rosuvastatin (CRESTOR) 40 MG tablet TAKE 1 TABLET (40 MG TOTAL) BY MOUTH DAILY. DISCONTINUE 20 MG DOSE (Patient taking differently: Take 40 mg by mouth daily. ) 90 tablet 2  . senna-docusate (SENOKOT S) 8.6-50 MG tablet Take 2 tablets by mouth at bedtime. 60 tablet 11  . SUMAtriptan (IMITREX) 100 MG tablet TAKE 1 TABLET BY MOUTH EVERY 2 HOURS AS NEEDED FOR MIAGRAINE/HEADACHE (Patient taking differently: Take 100 mg by mouth every 2 (two) hours as needed for migraine or headache. ) 9 tablet 0  . traZODone (DESYREL) 100 MG tablet TAKE 1 TABLET BY MOUTH EVERYDAY AT BEDTIME (Patient taking differently: Take 100 mg by mouth at bedtime as needed for sleep. ) 90 tablet 3  . Vitamin D, Ergocalciferol, (DRISDOL) 1.25 MG (50000 UT) CAPS capsule TAKE ONE CAPSULE BY MOUTH ONE TIME PER WEEK (Patient taking differently: Take 50,000 Units by mouth every Monday. ) 12 capsule 3  . albuterol (PROVENTIL HFA;VENTOLIN HFA) 108 (90 Base) MCG/ACT inhaler Inhale 2 puffs into the lungs every 6 (six) hours as needed for wheezing or shortness of breath. 1 Inhaler 5   No facility-administered medications prior to visit.    ROS: Review of Systems  Constitutional: Positive for fatigue. Negative for activity change, appetite change, chills and unexpected weight change.  HENT: Negative for congestion, mouth sores and sinus pressure.   Eyes: Negative for visual disturbance.  Respiratory: Negative for cough and chest tightness.   Gastrointestinal: Positive for abdominal pain and constipation. Negative for nausea.  Genitourinary: Positive for  dysuria, frequency and urgency. Negative for difficulty urinating and vaginal pain.  Musculoskeletal: Positive for arthralgias, back pain and gait problem.  Skin: Negative for pallor and rash.  Neurological: Positive for dizziness, weakness, light-headedness and headaches. Negative for tremors and numbness.  Psychiatric/Behavioral: Positive for decreased concentration, dysphoric mood and sleep disturbance. Negative for confusion and suicidal ideas. The patient is nervous/anxious.     Objective:  BP 130/60 (BP Location: Left Arm, Patient Position: Sitting, Cuff Size: Normal)   Pulse 89   Temp 98.8 F (37.1 C) (Oral)   Ht 5\' 5"  (1.651 m)   Wt 166 lb (75.3 kg)   SpO2 96%   BMI 27.62  kg/m   BP Readings from Last 3 Encounters:  10/01/19 130/60  09/30/19 (!) 112/35  09/22/19 (!) 134/45    Wt Readings from Last 3 Encounters:  10/01/19 166 lb (75.3 kg)  09/30/19 165 lb (74.8 kg)  09/22/19 165 lb 5.5 oz (75 kg)    Physical Exam Constitutional:      General: She is not in acute distress.    Appearance: She is well-developed. She is not toxic-appearing.  HENT:     Head: Normocephalic.     Right Ear: External ear normal.     Left Ear: External ear normal.     Nose: Nose normal.  Eyes:     General:        Right eye: No discharge.        Left eye: No discharge.     Conjunctiva/sclera: Conjunctivae normal.     Pupils: Pupils are equal, round, and reactive to light.  Neck:     Thyroid: No thyromegaly.     Vascular: No JVD.     Trachea: No tracheal deviation.  Cardiovascular:     Rate and Rhythm: Normal rate and regular rhythm.     Heart sounds: Normal heart sounds.  Pulmonary:     Effort: No respiratory distress.     Breath sounds: No stridor. No wheezing or rhonchi.  Abdominal:     General: Bowel sounds are normal. There is no distension.     Palpations: Abdomen is soft. There is no mass.     Tenderness: There is no abdominal tenderness. There is no guarding or rebound.    Musculoskeletal:        General: Tenderness present.     Cervical back: Normal range of motion and neck supple.  Lymphadenopathy:     Cervical: No cervical adenopathy.  Skin:    Findings: No erythema or rash.  Neurological:     Cranial Nerves: No cranial nerve deficit.     Motor: Weakness present. No abnormal muscle tone.     Coordination: Coordination normal.     Gait: Gait abnormal.     Deep Tendon Reflexes: Reflexes normal.  Psychiatric:        Behavior: Behavior normal.        Thought Content: Thought content normal.        Judgment: Judgment normal.   in a w/c  Lab Results  Component Value Date   WBC 9.4 09/30/2019   HGB 11.0 (L) 09/30/2019   HCT 35.0 (L) 09/30/2019   PLT 255 09/30/2019   GLUCOSE 112 (H) 09/30/2019   CHOL 128 06/29/2018   TRIG 184 (H) 06/29/2018   HDL 35 (L) 06/29/2018   LDLDIRECT 187.0 07/14/2015   LDLCALC 56 06/29/2018   ALT 41 09/30/2019   AST 17 09/30/2019   NA 136 09/30/2019   K 4.9 09/30/2019   CL 100 09/30/2019   CREATININE 1.15 (H) 09/30/2019   BUN 15 09/30/2019   CO2 31 09/30/2019   TSH 1.84 01/01/2019   INR 0.9 09/22/2019   HGBA1C  09/17/2008    5.0 (NOTE)   The ADA recommends the following therapeutic goal for glycemic   control related to Hgb A1C measurement:   Goal of Therapy:   < 7.0% Hgb A1C   Reference: American Diabetes Association: Clinical Practice   Recommendations 2008, Diabetes Care,  2008, 31:(Suppl 1).    CT Chest Wo Contrast  Result Date: 09/30/2019 CLINICAL DATA:  Follow-up pulmonary nodule EXAM: CT CHEST WITHOUT CONTRAST TECHNIQUE: Multidetector CT  imaging of the chest was performed following the standard protocol without IV contrast. COMPARISON:  [facility] CTA chest dated 09/24/2019 FINDINGS: Cardiovascular: The heart is normal in size. No pericardial effusion. No evidence thoracic aortic aneurysm. Atherosclerotic calcifications of the aortic arch. Coronary atherosclerosis of the right coronary artery.  Mediastinum/Nodes: No suspicious mediastinal lymphadenopathy. Visualized thyroid is unremarkable. Lungs/Pleura: Radiation changes in the medial right upper lobe/paramediastinal region. 2.8 x 3.1 cm predominantly solid mass in the lingula (series 3/image 77), previously 2.8 x 2.8 cm, grossly unchanged. Associated 19 mm central solid component posteriorly (series 3/image 78). Additional 7 mm solid nodule anteriorly (series 3/image 76). This appearance is compatible with primary bronchogenic neoplasm. Additional mild nodularity predominantly in the posterior left upper lobe, including a dominant 9 mm ground-glass nodule (series 3/image 37), unchanged. Scarring/atelectasis in the posterior left lower lobe. Mild centrilobular and paraseptal emphysematous changes, upper lung predominant. No focal consolidation. No pleural effusion or pneumothorax. Upper Abdomen: Visualized upper abdomen is notable for prior cholecystectomy, vascular calcifications, and a 15 mm posterior left renal cyst (series 2/image 137), incompletely visualized. Musculoskeletal: Mild degenerative changes of the visualized thoracolumbar spine. IMPRESSION: 3.1 cm predominantly solid mass in the lingula, as described above, compatible with primary bronchogenic neoplasm. Consider PET-CT and/or tissue sampling, as clinically warranted. Additional small left lung nodules, including a dominant 9 mm ground-glass nodule in the posterior left upper lobe, grossly unchanged. Radiation changes in the right hemithorax. Aortic Atherosclerosis (ICD10-I70.0) and Emphysema (ICD10-J43.9). Electronically Signed   By: Julian Hy M.D.   On: 09/30/2019 12:45    Assessment & Plan:   There are no diagnoses linked to this encounter.   No orders of the defined types were placed in this encounter.    Follow-up: No follow-ups on file.  Walker Kehr, MD

## 2019-10-01 NOTE — Assessment & Plan Note (Signed)
Norco, (Fentanyl - d/c 7/17)  Potential benefits of a long term opioids use as well as potential risks (i.e. addiction risk, apnea etc) and complications (i.e. Somnolence, constipation and others) were explained to the patient and were aknowledged. Narcane nasal prn Do not take w/Xanax

## 2019-10-01 NOTE — Telephone Encounter (Signed)
No los 4/6/  Dr Marin Olp approached me this morning and advised that the patient is coming in to see him today around 1:00.  I have added her to his schedule and notifed the front desk as well.  NO LABS per Dr Marin Olp

## 2019-10-01 NOTE — Assessment & Plan Note (Signed)
Relapse  CT from 4/6: 3.1 cm predominantly solid mass in the lingula, as described above, compatible with primary bronchogenic neoplasm.  F/u w/Dr Marin Olp

## 2019-10-01 NOTE — Progress Notes (Signed)
Hematology and Oncology Follow Up Visit  CELENIA HRUSKA [id number] 1955-07-11 64 y.o. 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 Silbernagel is back for a very quick follow-up. Unfortunately, we now are likely dealing with a bronchogenic carcinoma. We did go ahead and get a CT scan on her yesterday. This was a CT scan of the chest. This unfortunately showed a 2.8 x 3.1 cm mass in the lingula.  It was felt this was e highly likely to be a carcinoma. There is a 7 mm nodule which also is noted in the left lower lung. This also seems to be compatible with a bronchogenic metastatic focus. The upper abdomen looked okay. The bones looked okay.  Given her history of heavy tobacco use. I had to believe that this is going to be a bronchogenic carcinoma.  I'm not sure if this is causing her pain or not.  It is peripherally located. I would not think that it would be causing any hemoptysis.  I think that her neck step is going to be a PET scan on her.  I just would not believe that she is going to be a surgical candidate. At some point, we may have to consider a biopsy of this. I would think that the only way this could be biopsied would be with a percutaneous procedure.  We also found that she is little bit iron deficient. Her iron studies that were done yesterday showed a ferritin of 65 with iron saturation of 15%. I suspect we probably will have to get some IV iron into her.  She still is quite weak. She is in a wheelchair. She just has a very poor performance status.  I talked as she and her husband for about 45 minutes about the CT scan. I showed them the CT scan and why I felt this was a malignancy. I think they understand.  If she has a very active tumor on PET scan, then we might be able to avoid doing a biopsy. I would think that stereotactic radiosurgery might be a good option for her.  She  clearly is not a candidate for any systemic chemotherapy because of her performance status.  Medications:  Current Outpatient Medications:  .  acetaminophen (TYLENOL) 325 MG tablet, Take 2 tablets (650 mg total) by mouth every 4 (four) hours as needed for headache or mild pain., Disp: , Rfl:  .  albuterol (PROVENTIL) (2.5 MG/3ML) 0.083% nebulizer solution, Take 3 mLs (2.5 mg total) by nebulization every 4 (four) hours as needed for wheezing or shortness of breath., Disp: 150 mL, Rfl: 5 .  ALPRAZolam (XANAX) 0.25 MG tablet, Take 1 tablet (0.25 mg total) by mouth at bedtime as needed for anxiety., Disp: 90 tablet, Rfl: 1 .  bisacodyl (DULCOLAX) 5 MG EC tablet, Take 2 tablets (10 mg total) by mouth at bedtime. (Patient taking differently: Take 10 mg by mouth daily as needed for moderate constipation. ), Disp: 30 tablet, Rfl:  .  clopidogrel (PLAVIX) 75 MG tablet, TAKE 1 TABLET (75 MG TOTAL) BY MOUTH DAILY WITH BREAKFAST. (Patient taking differently: Take 75 mg by mouth daily. ), Disp: 90 tablet, Rfl: 3 .  diltiazem (CARDIZEM CD) 120 MG 24 hr capsule, TAKE 1 CAPSULE BY MOUTH AT BEDTIME. (Patient taking differently: Take 120 mg by mouth daily. ), Disp: 90 capsule, Rfl: 2 .  diphenhydrAMINE HCl, Sleep, (ZZZQUIL) 25  MG CAPS, Take 50 mg by mouth at bedtime as needed (sleep)., Disp: , Rfl:  .  furosemide (LASIX) 20 MG tablet, Take 1 tablet by mouth once daily (Patient taking differently: Take 20 mg by mouth daily. ), Disp: 90 tablet, Rfl: 2 .  guaiFENesin (MUCINEX) 600 MG 12 hr tablet, Take 1 tablet (600 mg total) by mouth 2 (two) times daily as needed for cough or to loosen phlegm., Disp: 14 tablet, Rfl: 0 .  HYDROcodone-acetaminophen (NORCO) 10-325 MG tablet, Take 1 tablet by mouth every 6 (six) hours as needed for up to 5 days for severe pain., Disp: 120 tablet, Rfl: 0 .  hydroxypropyl methylcellulose / hypromellose (ISOPTO TEARS / GONIOVISC) 2.5 % ophthalmic solution, Place 1 drop into both eyes 3 (three)  times daily as needed for dry eyes., Disp: , Rfl:  .  isosorbide mononitrate (IMDUR) 30 MG 24 hr tablet, Take 1 tablet by mouth once daily (Patient taking differently: Take 30 mg by mouth daily. ), Disp: 90 tablet, Rfl: 1 .  ketoconazole (NIZORAL) 2 % cream, Apply 1 application topically daily., Disp: 45 g, Rfl: 1 .  levothyroxine (SYNTHROID) 150 MCG tablet, Take 1 tablet (150 mcg total) by mouth daily., Disp: 90 tablet, Rfl: 3 .  nitroGLYCERIN (NITROSTAT) 0.4 MG SL tablet, DISSOLVE ONE TABLET UNDER THE TONGUE EVERY 5 MINUTES AS NEEDED FOR CHEST PAIN.  DO NOT EXCEED A TOTAL OF 3 DOSES IN 15 MINUTES (Patient taking differently: Place 0.4 mg under the tongue every 5 (five) minutes as needed for chest pain. ), Disp: 25 tablet, Rfl: 0 .  OXYGEN, Inhale 2.5 L/min into the lungs at bedtime., Disp: , Rfl:  .  pantoprazole (PROTONIX) 40 MG tablet, TAKE 1 TABLET BY MOUTH DAILY BEFORE BREAKFAST (Patient taking differently: Take 40 mg by mouth daily. ), Disp: 90 tablet, Rfl: 3 .  PARoxetine (PAXIL) 40 MG tablet, Take 1 tablet (40 mg total) by mouth daily., Disp: 90 tablet, Rfl: 3 .  phenytoin (DILANTIN) 100 MG ER capsule, TAKE 100 MG EVERY MORNING AND 200 MG EVERY NIGHT. (Patient taking differently: Take 100-200 mg by mouth See admin instructions. TAKE 100 MG EVERY MORNING AND 200 MG EVERY NIGHT.), Disp: 270 capsule, Rfl: 3 .  Respiratory Therapy Supplies (FLUTTER) DEVI, As directed, Disp: 1 each, Rfl: 0 .  senna-docusate (SENOKOT S) 8.6-50 MG tablet, Take 2 tablets by mouth at bedtime., Disp: 60 tablet, Rfl: 11 .  SUMAtriptan (IMITREX) 100 MG tablet, May repeat in 2 hours if headache persists or recurs., Disp: 9 tablet, Rfl: 3 .  traZODone (DESYREL) 100 MG tablet, TAKE 1 TABLET BY MOUTH EVERYDAY AT BEDTIME (Patient taking differently: Take 100 mg by mouth at bedtime as needed for sleep. ), Disp: 90 tablet, Rfl: 3 .  Vitamin D, Ergocalciferol, (DRISDOL) 1.25 MG (50000 UT) CAPS capsule, TAKE ONE CAPSULE BY MOUTH  ONE TIME PER WEEK (Patient taking differently: Take 50,000 Units by mouth every Monday. ), Disp: 12 capsule, Rfl: 3 .  albuterol (PROVENTIL HFA;VENTOLIN HFA) 108 (90 Base) MCG/ACT inhaler, Inhale 2 puffs into the lungs every 6 (six) hours as needed for wheezing or shortness of breath., Disp: 1 Inhaler, Rfl: 5 .  budesonide-formoterol (SYMBICORT) 160-4.5 MCG/ACT inhaler, Inhale 2 puffs into the lungs 2 (two) times daily at 10 AM and 5 PM. Rinse mouth after each use (Patient not taking: Reported on 10/01/2019), Disp: 1 Inhaler, Rfl: 0 .  ciprofloxacin (CIPRO) 250 MG tablet, Take 1 tablet (250 mg total) by mouth 2 (  two) times daily. (Patient not taking: Reported on 10/01/2019), Disp: 14 tablet, Rfl: 0 .  HYDROcodone-acetaminophen (NORCO) 10-325 MG tablet, Take 1 tablet by mouth every 6 (six) hours as needed for severe pain. Please fill on or after 10/16/19 (Patient not taking: Reported on 10/01/2019), Disp: 120 tablet, Rfl: 0 .  HYDROcodone-acetaminophen (NORCO) 10-325 MG tablet, Take 1 tablet by mouth every 6 (six) hours as needed for up to 5 days for severe pain. (Patient not taking: Reported on 10/01/2019), Disp: 120 tablet, Rfl: 0 .  predniSONE (DELTASONE) 10 MG tablet, Take 4 tablets (40 mg) daily for 2 days, then, Take 3 tablets (30 mg) daily for 2 days, then, Take 2 tablets (20 mg) daily for 2 days, then, Take 1 tablets (10 mg) daily for 1 days, then stop (Patient not taking: Reported on 10/01/2019), Disp: 19 tablet, Rfl: 0 .  promethazine (PHENERGAN) 25 MG tablet, Take 1 tablet (25 mg total) by mouth every 8 (eight) hours as needed for nausea or vomiting. (Patient not taking: Reported on 10/01/2019), Disp: 90 tablet, Rfl: 1  Allergies:  Allergies  Allergen Reactions  . Morphine     "Makes me go crazy."   . Nitrofuran Derivatives     ??confusion  . Oxycodone Hcl Other (See Comments)    Knocked her out for 6 days  . Penicillins Hives    Has patient had a PCN reaction causing immediate rash,  facial/tongue/throat swelling, SOB or lightheadedness with hypotension: unknown Has patient had a PCN reaction causing severe rash involving mucus membranes or skin necrosis: unknown Has patient had a PCN reaction that required hospitalization No Has patient had a PCN reaction occurring within the last 10 years: No If all of the above answers are "NO", then may proceed with Cephalosporin use.  Other reaction(s): Rash-Generalized  . Tape Other (See Comments)    Skin turns red and burns    Past Medical History, Surgical history, Social history, and Family History were reviewed and updated.  Review of Systems: Review of Systems  Constitutional: Positive for fatigue.  HENT:  Negative.   Eyes: Negative.   Respiratory: Positive for cough.   Cardiovascular: Positive for chest pain.  Gastrointestinal: Negative.   Endocrine: Negative.   Genitourinary: Negative.    Musculoskeletal: Positive for arthralgias and myalgias.  Skin: Negative.   Neurological: Negative.   Hematological: Negative.   Psychiatric/Behavioral: Negative.     Physical Exam:  height is 5\' 5"  (1.651 m) and weight is 166 lb (75.3 kg). Her temporal temperature is 97.7 F (36.5 C). Her blood pressure is 114/41 (abnormal) and her pulse is 88. Her respiration is 20 and oxygen saturation is 99%.   Wt Readings from Last 3 Encounters:  10/01/19 166 lb (75.3 kg)  10/01/19 166 lb (75.3 kg)  09/30/19 165 lb (74.8 kg)    Physical Exam Vitals reviewed.  HENT:     Head: Normocephalic and atraumatic.  Eyes:     Pupils: Pupils are equal, round, and reactive to light.  Cardiovascular:     Rate and Rhythm: Normal rate and regular rhythm.     Heart sounds: Normal heart sounds.     Comments: Cardiac exam shows a regular rate and rhythm.  She has no murmurs, rubs or bruits. Pulmonary:     Effort: Pulmonary effort is normal.     Breath sounds: Normal breath sounds.     Comments: Pulmonary exam shows scattered rhonchi  bilaterally.  She has some wheezes.  She has a decent  air movement. Abdominal:     General: Bowel sounds are normal.     Palpations: Abdomen is soft.  Musculoskeletal:        General: No tenderness or deformity. Normal range of motion.     Cervical back: Normal range of motion.  Lymphadenopathy:     Cervical: No cervical adenopathy.  Skin:    General: Skin is warm and dry.     Findings: No erythema or rash.     Comments: Skin exam shows scattered ecchymoses.  Neurological:     Mental Status: She is alert and oriented to person, place, and time.  Psychiatric:        Behavior: Behavior normal.        Thought Content: Thought content normal.        Judgment: Judgment normal.      Lab Results  Component Value Date   WBC 9.4 09/30/2019   HGB 11.0 (L) 09/30/2019   HCT 35.0 (L) 09/30/2019   MCV 96.4 09/30/2019   PLT 255 09/30/2019     Chemistry      Component Value Date/Time   NA 136 09/30/2019 1021   NA 144 07/08/2018 1059   K 4.9 09/30/2019 1021   CL 100 09/30/2019 1021   CO2 31 09/30/2019 1021   BUN 15 09/30/2019 1021   BUN 12 07/08/2018 1059   CREATININE 1.15 (H) 09/30/2019 1021      Component Value Date/Time   CALCIUM 9.1 09/30/2019 1021   ALKPHOS 143 (H) 09/30/2019 1021   AST 17 09/30/2019 1021   ALT 41 09/30/2019 1021   BILITOT 0.3 09/30/2019 1021       Impression and Plan: Ms. Mcmenamin is a 64 year old white female. We were able to get the CT scan done yesterday. The CT scan really showed [place] the problem. Again had to believe that this is going to be a bronchogenic carcinoma in her left lingula.  We'll see about a PET scan on her. We'll try to get this set up for next week.  I really hate this for her. Her overall performance status is again, is not that good. She certainly looks older than her stated age. We really will have to be cautious with how we proceed with respect to any intervention for this nodule.  We will plan to get her back once I have the PET  scan result in and then we will try to sort out how we can best manage this situation.  [name], MD 4/7/20214:51 PM

## 2019-10-01 NOTE — Patient Outreach (Signed)
[facility]) Care Management  10/01/2019  Chevonne L Chai 01/19/1956 [id number]   RED ON EMMI ALERT- General Discharge Day #4 Date:3/28 Red Alert Reason:No follow up, Other questions/problems, and feeling sad/empty/hopeless/anxious   Outreach attempt #3, unsuccessful. HIPAA compliatn voice message left.  Notified by hospital liaison that member was discharged from [facility] on 4/2.  Will make 4th and final attempt within the next 3-4 business days.  Valente David, [place], MSN [place] [phone]

## 2019-10-01 NOTE — Assessment & Plan Note (Signed)
Trazodone at hs Xanax prn  Potential benefits of a long term benzodiazepines  use as well as potential risks  and complications were explained to the patient and were aknowledged.

## 2019-10-01 NOTE — Assessment & Plan Note (Signed)
In a w/c 

## 2019-10-01 NOTE — Telephone Encounter (Signed)
Verbal orders given for below.  

## 2019-10-01 NOTE — Assessment & Plan Note (Signed)
Levothroid 

## 2019-10-01 NOTE — Assessment & Plan Note (Signed)
UA Cipro po 

## 2019-10-02 ENCOUNTER — Other Ambulatory Visit: Payer: Self-pay | Admitting: Family

## 2019-10-02 ENCOUNTER — Other Ambulatory Visit: Payer: Self-pay

## 2019-10-02 ENCOUNTER — Telehealth: Payer: Self-pay | Admitting: Hematology & Oncology

## 2019-10-02 ENCOUNTER — Inpatient Hospital Stay: Payer: Medicare HMO

## 2019-10-02 VITALS — BP 130/39 | HR 85 | Temp 97.1°F | Resp 20

## 2019-10-02 DIAGNOSIS — D500 Iron deficiency anemia secondary to blood loss (chronic): Secondary | ICD-10-CM

## 2019-10-02 DIAGNOSIS — Z9981 Dependence on supplemental oxygen: Secondary | ICD-10-CM | POA: Diagnosis not present

## 2019-10-02 DIAGNOSIS — R05 Cough: Secondary | ICD-10-CM | POA: Diagnosis not present

## 2019-10-02 DIAGNOSIS — R0789 Other chest pain: Secondary | ICD-10-CM | POA: Diagnosis not present

## 2019-10-02 DIAGNOSIS — R911 Solitary pulmonary nodule: Secondary | ICD-10-CM | POA: Diagnosis not present

## 2019-10-02 DIAGNOSIS — R69 Illness, unspecified: Secondary | ICD-10-CM | POA: Diagnosis not present

## 2019-10-02 DIAGNOSIS — E611 Iron deficiency: Secondary | ICD-10-CM | POA: Diagnosis not present

## 2019-10-02 DIAGNOSIS — F17210 Nicotine dependence, cigarettes, uncomplicated: Secondary | ICD-10-CM | POA: Diagnosis not present

## 2019-10-02 DIAGNOSIS — M1990 Unspecified osteoarthritis, unspecified site: Secondary | ICD-10-CM | POA: Diagnosis not present

## 2019-10-02 MED ORDER — SODIUM CHLORIDE 0.9 % IV SOLN
200.0000 mg | Freq: Once | INTRAVENOUS | Status: AC
  Administered 2019-10-02: 200 mg via INTRAVENOUS
  Filled 2019-10-02: qty 200

## 2019-10-02 MED ORDER — SODIUM CHLORIDE 0.9 % IV SOLN: 510.0000 mg | Freq: Once | INTRAVENOUS | Status: DC

## 2019-10-02 MED ORDER — SODIUM CHLORIDE 0.9 % IV SOLN
Freq: Once | INTRAVENOUS | Status: AC
  Filled 2019-10-02: qty 250

## 2019-10-02 NOTE — Progress Notes (Signed)
Okay for Venofer today per Tara Green, Financial Advocate. °

## 2019-10-02 NOTE — Patient Instructions (Signed)

## 2019-10-02 NOTE — Telephone Encounter (Signed)
No los 4/7

## 2019-10-04 DIAGNOSIS — I5032 Chronic diastolic (congestive) heart failure: Secondary | ICD-10-CM | POA: Diagnosis not present

## 2019-10-04 DIAGNOSIS — M87051 Idiopathic aseptic necrosis of right femur: Secondary | ICD-10-CM | POA: Diagnosis not present

## 2019-10-04 DIAGNOSIS — J9621 Acute and chronic respiratory failure with hypoxia: Secondary | ICD-10-CM | POA: Diagnosis not present

## 2019-10-04 DIAGNOSIS — J189 Pneumonia, unspecified organism: Secondary | ICD-10-CM | POA: Diagnosis not present

## 2019-10-04 DIAGNOSIS — J441 Chronic obstructive pulmonary disease with (acute) exacerbation: Secondary | ICD-10-CM | POA: Diagnosis not present

## 2019-10-04 DIAGNOSIS — M542 Cervicalgia: Secondary | ICD-10-CM | POA: Diagnosis not present

## 2019-10-04 DIAGNOSIS — R918 Other nonspecific abnormal finding of lung field: Secondary | ICD-10-CM | POA: Diagnosis not present

## 2019-10-04 DIAGNOSIS — M25519 Pain in unspecified shoulder: Secondary | ICD-10-CM | POA: Diagnosis not present

## 2019-10-04 DIAGNOSIS — J440 Chronic obstructive pulmonary disease with acute lower respiratory infection: Secondary | ICD-10-CM | POA: Diagnosis not present

## 2019-10-04 DIAGNOSIS — I110 Hypertensive heart disease with heart failure: Secondary | ICD-10-CM | POA: Diagnosis not present

## 2019-10-06 ENCOUNTER — Other Ambulatory Visit: Payer: Self-pay | Admitting: *Deleted

## 2019-10-06 NOTE — Patient Outreach (Signed)
[facility]) Care Management  10/06/2019  Kaylee Hansen 1955/11/18 [id number]   RED ON EMMI ALERT- General Discharge Day #4 Date:3/28 Red Alert Reason:No follow up, Other questions/problems, and feeling sad/empty/hopeless/anxious   Outreach attempt #4, Female answering phone state he is out of the home at the moment but will have member call back.     Update:  Voice message received from member after missed call.  Call placed back to member, female report she is in bed and not available.  Will follow up with member one more time within the next 3-4 days.  If unsuccessful with engagement will close case.  Valente David, [place], MSN [place] [phone]

## 2019-10-07 ENCOUNTER — Telehealth: Payer: Self-pay | Admitting: Internal Medicine

## 2019-10-07 DIAGNOSIS — R918 Other nonspecific abnormal finding of lung field: Secondary | ICD-10-CM | POA: Diagnosis not present

## 2019-10-07 DIAGNOSIS — M542 Cervicalgia: Secondary | ICD-10-CM | POA: Diagnosis not present

## 2019-10-07 DIAGNOSIS — I5032 Chronic diastolic (congestive) heart failure: Secondary | ICD-10-CM | POA: Diagnosis not present

## 2019-10-07 DIAGNOSIS — J440 Chronic obstructive pulmonary disease with acute lower respiratory infection: Secondary | ICD-10-CM | POA: Diagnosis not present

## 2019-10-07 DIAGNOSIS — I110 Hypertensive heart disease with heart failure: Secondary | ICD-10-CM | POA: Diagnosis not present

## 2019-10-07 DIAGNOSIS — J441 Chronic obstructive pulmonary disease with (acute) exacerbation: Secondary | ICD-10-CM | POA: Diagnosis not present

## 2019-10-07 DIAGNOSIS — J189 Pneumonia, unspecified organism: Secondary | ICD-10-CM | POA: Diagnosis not present

## 2019-10-07 DIAGNOSIS — M87051 Idiopathic aseptic necrosis of right femur: Secondary | ICD-10-CM | POA: Diagnosis not present

## 2019-10-07 DIAGNOSIS — J9621 Acute and chronic respiratory failure with hypoxia: Secondary | ICD-10-CM | POA: Diagnosis not present

## 2019-10-07 DIAGNOSIS — M25519 Pain in unspecified shoulder: Secondary | ICD-10-CM | POA: Diagnosis not present

## 2019-10-07 NOTE — Telephone Encounter (Signed)
FYI  Never received anything from pharmacy but did call to okay to fill.

## 2019-10-07 NOTE — Telephone Encounter (Signed)
   Kaylee Hansen from [place] is currently at the patient's home calling to report patient was having chest pain, Nitro taken, declined to go to ED. Also reporting she has not starting taking Cipro due to possible drug interaction with Prednisone, states pharmacy has been trying to contact office.  Call transferred to [facility] nurse for additional urgent assessment.

## 2019-10-08 ENCOUNTER — Other Ambulatory Visit: Payer: Self-pay | Admitting: *Deleted

## 2019-10-08 ENCOUNTER — Encounter: Payer: Self-pay | Admitting: *Deleted

## 2019-10-08 NOTE — Patient Outreach (Signed)
Kaylee Hansen) Care Management  10/08/2019  Kaylee Hansen 23-Feb-1956 [id number]    Outreach attempt #5, successful, identity verified.  This care manager introduced self and stated purpose of call.  [facility] care management services explained.  Social: Lives with husband who is her primary caregiver.  State she is slow but independent in her care with the help of her husband.  She uses a power chair for mobility but able to transition on her own.  She depends on him for meals but admit they are eating out most of the time.  She does have home health involved, receiving weekly visits.    Conditions: Per chart, has history of HTN, CAD, COPD, lung cancer, GERD, hypothyroid, hyperlipidemia, osteoarthritis, and seizure disorder.  She is also a smoker since the age of 27.  State she has cut down from 1 pack every 2 days to 1 pack every 10 days.    Medications: Reviewed with member, report she is able to take them independently.  Denies needing financial assistance.  Appointments: Has appointment for PET scan on 4/21 to determine how advanced the cancer is, will have follow up with Dr. Marin Olp after scan to discuss plan of care.  She has been experiencing some chest discomfort over the past few days, advised to be evaluated in ED however declines to go, state she will go if it get too bad.  Plan: RN CM will follow up with member within the next 2 weeks.  Fall Risk  10/08/2019 10/03/2016  Falls in the past year? 1 Yes  Number falls in past yr: 1 2 or more  Injury with Fall? 0 No  Risk for fall due to : History of fall(s) -  Follow up Falls prevention discussed -   Depression screen [facility] 2/9 10/08/2019 04/04/2017  Decreased Interest 2 3  Down, Depressed, Hopeless 1 3  PHQ - 2 Score 3 6  Altered sleeping 3 2  Tired, decreased energy 3 2  Change in appetite 0 0  Feeling bad or failure about yourself  0 1  Trouble concentrating 1 0  Moving slowly or fidgety/restless 3 0  Suicidal  thoughts 0 0  PHQ-9 Score 13 11  Difficult doing work/chores Not difficult at all -  Some recent data might be hidden   [facility] CM Care Plan Problem One     Most Recent Value  Care Plan Problem One  Risk for readmission related to cancer diagnosis as evidenced by multiple hospitalization over the last 3 months  Role Documenting the Problem One  Care Management [place] for Problem One  Active  [facility] Long Term Goal   Member will not be readmitted to hospital within the next 31 days  [facility] Long Term Goal Start Date  10/08/19  Interventions for Problem One Long Term Goal  AVS reviewed with member.  Discussed course of action (follow up appointment, home health involvement, etc) in effort to decrease risk of readmission  [facility] CM Short Term Goal #1   Member will keep and attend appointment for scan within the next 2 weeks  [facility] CM Short Term Goal #1 Start Date  10/08/19  Interventions for Short Term Goal #1  Member educated on importance of completing scan in effort to develop plan of care  [facility] CM Short Term Goal #2   Member will report decrease in smoking within the next 4 weeks  THN CM Short Term Goal #2 Start Date  10/08/19  Interventions for Short  Term Goal #2  Reviewed dangers of continued smoking in relation to cancer.  Educated on smoking cessation interventions     Valente David, Therapist, sports, MSN [place] [phone]

## 2019-10-09 DIAGNOSIS — M542 Cervicalgia: Secondary | ICD-10-CM | POA: Diagnosis not present

## 2019-10-09 DIAGNOSIS — M25519 Pain in unspecified shoulder: Secondary | ICD-10-CM | POA: Diagnosis not present

## 2019-10-09 DIAGNOSIS — M87051 Idiopathic aseptic necrosis of right femur: Secondary | ICD-10-CM | POA: Diagnosis not present

## 2019-10-09 DIAGNOSIS — I110 Hypertensive heart disease with heart failure: Secondary | ICD-10-CM | POA: Diagnosis not present

## 2019-10-09 DIAGNOSIS — J440 Chronic obstructive pulmonary disease with acute lower respiratory infection: Secondary | ICD-10-CM | POA: Diagnosis not present

## 2019-10-09 DIAGNOSIS — J441 Chronic obstructive pulmonary disease with (acute) exacerbation: Secondary | ICD-10-CM | POA: Diagnosis not present

## 2019-10-09 DIAGNOSIS — R918 Other nonspecific abnormal finding of lung field: Secondary | ICD-10-CM | POA: Diagnosis not present

## 2019-10-09 DIAGNOSIS — J189 Pneumonia, unspecified organism: Secondary | ICD-10-CM | POA: Diagnosis not present

## 2019-10-09 DIAGNOSIS — J9621 Acute and chronic respiratory failure with hypoxia: Secondary | ICD-10-CM | POA: Diagnosis not present

## 2019-10-09 DIAGNOSIS — I5032 Chronic diastolic (congestive) heart failure: Secondary | ICD-10-CM | POA: Diagnosis not present

## 2019-10-13 DIAGNOSIS — M87051 Idiopathic aseptic necrosis of right femur: Secondary | ICD-10-CM | POA: Diagnosis not present

## 2019-10-13 DIAGNOSIS — J440 Chronic obstructive pulmonary disease with acute lower respiratory infection: Secondary | ICD-10-CM | POA: Diagnosis not present

## 2019-10-13 DIAGNOSIS — J441 Chronic obstructive pulmonary disease with (acute) exacerbation: Secondary | ICD-10-CM | POA: Diagnosis not present

## 2019-10-13 DIAGNOSIS — M542 Cervicalgia: Secondary | ICD-10-CM | POA: Diagnosis not present

## 2019-10-13 DIAGNOSIS — J189 Pneumonia, unspecified organism: Secondary | ICD-10-CM | POA: Diagnosis not present

## 2019-10-13 DIAGNOSIS — J9621 Acute and chronic respiratory failure with hypoxia: Secondary | ICD-10-CM | POA: Diagnosis not present

## 2019-10-13 DIAGNOSIS — I110 Hypertensive heart disease with heart failure: Secondary | ICD-10-CM | POA: Diagnosis not present

## 2019-10-13 DIAGNOSIS — I5032 Chronic diastolic (congestive) heart failure: Secondary | ICD-10-CM | POA: Diagnosis not present

## 2019-10-13 DIAGNOSIS — M25519 Pain in unspecified shoulder: Secondary | ICD-10-CM | POA: Diagnosis not present

## 2019-10-13 DIAGNOSIS — R918 Other nonspecific abnormal finding of lung field: Secondary | ICD-10-CM | POA: Diagnosis not present

## 2019-10-15 ENCOUNTER — Ambulatory Visit (HOSPITAL_COMMUNITY)
Admission: RE | Admit: 2019-10-15 | Discharge: 2019-10-15 | Disposition: A | Discharge status: Home / Self Care | Payer: Medicare HMO | Source: Ambulatory Visit | Attending: Hematology & Oncology | Admitting: Hematology & Oncology

## 2019-10-15 ENCOUNTER — Other Ambulatory Visit: Payer: Self-pay

## 2019-10-15 DIAGNOSIS — X58XXXA Exposure to other specified factors, initial encounter: Secondary | ICD-10-CM | POA: Insufficient documentation

## 2019-10-15 DIAGNOSIS — J9621 Acute and chronic respiratory failure with hypoxia: Secondary | ICD-10-CM | POA: Diagnosis not present

## 2019-10-15 DIAGNOSIS — C3490 Malignant neoplasm of unspecified part of unspecified bronchus or lung: Secondary | ICD-10-CM

## 2019-10-15 DIAGNOSIS — S2242XA Multiple fractures of ribs, left side, initial encounter for closed fracture: Secondary | ICD-10-CM | POA: Insufficient documentation

## 2019-10-15 DIAGNOSIS — M87051 Idiopathic aseptic necrosis of right femur: Secondary | ICD-10-CM | POA: Diagnosis not present

## 2019-10-15 DIAGNOSIS — I700 Atherosclerosis of aorta: Secondary | ICD-10-CM | POA: Insufficient documentation

## 2019-10-15 DIAGNOSIS — J189 Pneumonia, unspecified organism: Secondary | ICD-10-CM | POA: Diagnosis not present

## 2019-10-15 DIAGNOSIS — I2510 Atherosclerotic heart disease of native coronary artery without angina pectoris: Secondary | ICD-10-CM | POA: Diagnosis not present

## 2019-10-15 DIAGNOSIS — J440 Chronic obstructive pulmonary disease with acute lower respiratory infection: Secondary | ICD-10-CM | POA: Diagnosis not present

## 2019-10-15 DIAGNOSIS — M25519 Pain in unspecified shoulder: Secondary | ICD-10-CM | POA: Diagnosis not present

## 2019-10-15 DIAGNOSIS — R918 Other nonspecific abnormal finding of lung field: Secondary | ICD-10-CM | POA: Diagnosis not present

## 2019-10-15 DIAGNOSIS — J441 Chronic obstructive pulmonary disease with (acute) exacerbation: Secondary | ICD-10-CM | POA: Diagnosis not present

## 2019-10-15 DIAGNOSIS — I110 Hypertensive heart disease with heart failure: Secondary | ICD-10-CM | POA: Diagnosis not present

## 2019-10-15 DIAGNOSIS — R69 Illness, unspecified: Secondary | ICD-10-CM | POA: Diagnosis not present

## 2019-10-15 DIAGNOSIS — I5032 Chronic diastolic (congestive) heart failure: Secondary | ICD-10-CM | POA: Diagnosis not present

## 2019-10-15 DIAGNOSIS — M542 Cervicalgia: Secondary | ICD-10-CM | POA: Diagnosis not present

## 2019-10-15 LAB — GLUCOSE, CAPILLARY: Glucose-Capillary: 86 mg/dL (ref 70–99)

## 2019-10-15 MED ORDER — FLUDEOXYGLUCOSE F - 18 (FDG) INJECTION
8.2000 | Freq: Once | INTRAVENOUS | Status: AC
  Administered 2019-10-15: 8.2 via INTRAVENOUS

## 2019-10-17 ENCOUNTER — Telehealth: Payer: Self-pay | Admitting: Hematology & Oncology

## 2019-10-17 NOTE — Telephone Encounter (Signed)
I called Kaylee Hansen today about the PET scan result. She had the PET scan done on 10/15/2019. This was to try to help better identify this tumor in the left lung.  The PET scan is positive for the tumor in the left lung. On PET scan she has a positive a 2.6 cm nodule in the lingula. Has an SUV of 8.4. An adjacent centimeter millimeter nodule also is positive. Outside of that, there really is no other areas that are positive.  She has a poor performance status. She is really not a candidate for biopsy. I think a biopsy would be quite tricky with her. She is oxygen dependent COPD.  I told her and her husband that I thought that radiosurgery would be a very good idea for her. This would be outpatient. Typically they give about 3-5 treatments.  I do not think it would have an adverse effect on her lung function.  She would be agreeable to this.  We will plan to get her over to Radiation Oncology. I will speak with Dr. Sondra Come about this.  I would like to see her back here in the office in about 3 weeks or so.  She and her husband understand all of this.  Lattie Haw, MD

## 2019-10-18 DIAGNOSIS — C3490 Malignant neoplasm of unspecified part of unspecified bronchus or lung: Secondary | ICD-10-CM | POA: Diagnosis not present

## 2019-10-20 DIAGNOSIS — I5032 Chronic diastolic (congestive) heart failure: Secondary | ICD-10-CM | POA: Diagnosis not present

## 2019-10-20 DIAGNOSIS — M542 Cervicalgia: Secondary | ICD-10-CM | POA: Diagnosis not present

## 2019-10-20 DIAGNOSIS — Z9181 History of falling: Secondary | ICD-10-CM

## 2019-10-20 DIAGNOSIS — Z7952 Long term (current) use of systemic steroids: Secondary | ICD-10-CM

## 2019-10-20 DIAGNOSIS — R918 Other nonspecific abnormal finding of lung field: Secondary | ICD-10-CM | POA: Diagnosis not present

## 2019-10-20 DIAGNOSIS — M87051 Idiopathic aseptic necrosis of right femur: Secondary | ICD-10-CM | POA: Diagnosis not present

## 2019-10-20 DIAGNOSIS — I110 Hypertensive heart disease with heart failure: Secondary | ICD-10-CM | POA: Diagnosis not present

## 2019-10-20 DIAGNOSIS — J9621 Acute and chronic respiratory failure with hypoxia: Secondary | ICD-10-CM | POA: Diagnosis not present

## 2019-10-20 DIAGNOSIS — J441 Chronic obstructive pulmonary disease with (acute) exacerbation: Secondary | ICD-10-CM | POA: Diagnosis not present

## 2019-10-20 DIAGNOSIS — M25519 Pain in unspecified shoulder: Secondary | ICD-10-CM | POA: Diagnosis not present

## 2019-10-20 DIAGNOSIS — G894 Chronic pain syndrome: Secondary | ICD-10-CM

## 2019-10-20 DIAGNOSIS — E039 Hypothyroidism, unspecified: Secondary | ICD-10-CM

## 2019-10-20 DIAGNOSIS — Z79891 Long term (current) use of opiate analgesic: Secondary | ICD-10-CM

## 2019-10-20 DIAGNOSIS — M84411D Pathological fracture, right shoulder, subsequent encounter for fracture with routine healing: Secondary | ICD-10-CM

## 2019-10-20 DIAGNOSIS — Z923 Personal history of irradiation: Secondary | ICD-10-CM

## 2019-10-20 DIAGNOSIS — M88811 Osteitis deformans of right shoulder: Secondary | ICD-10-CM

## 2019-10-20 DIAGNOSIS — F419 Anxiety disorder, unspecified: Secondary | ICD-10-CM

## 2019-10-20 DIAGNOSIS — J440 Chronic obstructive pulmonary disease with acute lower respiratory infection: Secondary | ICD-10-CM | POA: Diagnosis not present

## 2019-10-20 DIAGNOSIS — Z993 Dependence on wheelchair: Secondary | ICD-10-CM

## 2019-10-20 DIAGNOSIS — I252 Old myocardial infarction: Secondary | ICD-10-CM

## 2019-10-20 DIAGNOSIS — F17210 Nicotine dependence, cigarettes, uncomplicated: Secondary | ICD-10-CM

## 2019-10-20 DIAGNOSIS — I2510 Atherosclerotic heart disease of native coronary artery without angina pectoris: Secondary | ICD-10-CM

## 2019-10-20 DIAGNOSIS — Z9981 Dependence on supplemental oxygen: Secondary | ICD-10-CM

## 2019-10-20 DIAGNOSIS — Z85118 Personal history of other malignant neoplasm of bronchus and lung: Secondary | ICD-10-CM

## 2019-10-20 DIAGNOSIS — G40909 Epilepsy, unspecified, not intractable, without status epilepticus: Secondary | ICD-10-CM

## 2019-10-20 DIAGNOSIS — I700 Atherosclerosis of aorta: Secondary | ICD-10-CM

## 2019-10-20 DIAGNOSIS — J189 Pneumonia, unspecified organism: Secondary | ICD-10-CM | POA: Diagnosis not present

## 2019-10-20 DIAGNOSIS — E7800 Pure hypercholesterolemia, unspecified: Secondary | ICD-10-CM

## 2019-10-20 DIAGNOSIS — R945 Abnormal results of liver function studies: Secondary | ICD-10-CM

## 2019-10-20 DIAGNOSIS — Z7902 Long term (current) use of antithrombotics/antiplatelets: Secondary | ICD-10-CM

## 2019-10-20 DIAGNOSIS — Z9221 Personal history of antineoplastic chemotherapy: Secondary | ICD-10-CM

## 2019-10-21 DIAGNOSIS — I5032 Chronic diastolic (congestive) heart failure: Secondary | ICD-10-CM | POA: Diagnosis not present

## 2019-10-21 DIAGNOSIS — J441 Chronic obstructive pulmonary disease with (acute) exacerbation: Secondary | ICD-10-CM | POA: Diagnosis not present

## 2019-10-21 DIAGNOSIS — M25519 Pain in unspecified shoulder: Secondary | ICD-10-CM | POA: Diagnosis not present

## 2019-10-21 DIAGNOSIS — J189 Pneumonia, unspecified organism: Secondary | ICD-10-CM | POA: Diagnosis not present

## 2019-10-21 DIAGNOSIS — M87051 Idiopathic aseptic necrosis of right femur: Secondary | ICD-10-CM | POA: Diagnosis not present

## 2019-10-21 DIAGNOSIS — J440 Chronic obstructive pulmonary disease with acute lower respiratory infection: Secondary | ICD-10-CM | POA: Diagnosis not present

## 2019-10-21 DIAGNOSIS — R918 Other nonspecific abnormal finding of lung field: Secondary | ICD-10-CM | POA: Diagnosis not present

## 2019-10-21 DIAGNOSIS — M542 Cervicalgia: Secondary | ICD-10-CM | POA: Diagnosis not present

## 2019-10-21 DIAGNOSIS — J9621 Acute and chronic respiratory failure with hypoxia: Secondary | ICD-10-CM | POA: Diagnosis not present

## 2019-10-21 DIAGNOSIS — I110 Hypertensive heart disease with heart failure: Secondary | ICD-10-CM | POA: Diagnosis not present

## 2019-10-22 ENCOUNTER — Other Ambulatory Visit: Payer: Self-pay | Admitting: *Deleted

## 2019-10-22 NOTE — Patient Outreach (Signed)
[facility]) Care Management  10/22/2019  BLESSED GIRDNER [birth year] [id number]   Call placed to member to follow up on ongoing recovery from hospitalization.  She state she is doing "alright."  She did have the PET scan done, per note referral was placed to radiology oncologist.  Member report she has not received a call to schedule visit, concerned as it has been a week since her results.  This care manager was preparing to call office regarding referral but noted member has been scheduled for visit on 5/5.  She denied any other concerns, eager to start treatment for cancer.  Will follow up within the next month.  [facility] CM Care Plan Problem One     Most Recent Value  Care Plan Problem One  Risk for readmission related to cancer diagnosis as evidenced by multiple hospitalization over the last 3 months  Role Documenting the Problem One  Care Management [place] for Problem One  Active  [facility] Long Term Goal   Member will not be readmitted to hospital within the next 31 days  [facility] Long Term Goal Start Date  10/08/19  Interventions for Problem One Long Term Goal  Potential plan of care reviewed with member.  Encouraged to continue involvement with home health in effort to increase strength and recovery   [facility] CM Short Term Goal #1   Member will keep and attend appointment for scan within the next 2 weeks  [facility] CM Short Term Goal #1 Start Date  10/08/19  [facility] CM Short Term Goal #1 Met Date  10/22/19  [facility]N CM Short Term Goal #2   Member will report decrease in smoking within the next 4 weeks  [facility] CM Short Term Goal #2 Start Date  10/08/19  Interventions for Short Term Goal #2  Reminded of smoking cessation interventions  [facility]N CM Short Term Goal #3  Member will have appointment with radiology oncologist within the next 3 weeks  [facility] CM Short Term Goal #3 Start Date  10/22/19  Interventions for Short Tern Goal #3  Will call office to follow up on referral from oncology      Valente David, RN, MSN [place] Manager [phone]

## 2019-10-28 NOTE — Progress Notes (Signed)
Radiation Oncology         [phone] ________________________________  Initial Outpatient Consultation  Name: Kaylee Hansen MRN: [id number]  Date: 10/29/2019  DOB: 20-Jan-1956  KX:[id number], [name], MD  [name], MD   REFERRING PHYSICIAN: [name], MD  DIAGNOSIS: The primary encounter diagnosis was Malignant neoplasm of lingula of lung ([place]). A diagnosis of Cancer of lingula of lung ([place]) was also pertinent to this visit.  Left lingular nodule - likely bronchogenic carcinoma   HISTORY OF PRESENT ILLNESS::Kaylee Hansen is a 64 y.o. female who is accompanied by her husband. The patient presented to Dr. Marin Olp on 09/30/2019 after 10+ years. Per his note that day, the patient had a CT scan of her chest done at [facility] in March of 2020 that showed "a spot that was lung cancer", and so she was referred back to him for follow-up.   CT of chest on 09/30/2019 showed a 3.1 cm predominantly solid mass in the lingula that was compatible with primary bronchogenic neoplasm.  PET scan on 10/15/2019 showed a hypermetabolic 7 mm nodule and adjacent 2.6 cm sub solid nodule in the lingula, indicative of primary bronchogenic carcinoma. There were additional lung nodules, including a 7 mm apical left upper lobe nodule, that were too small for PET resolution. There was also a mildly hypermetabolic left level 2 lymph node in the neck. Finally, there were acute nondisplaced fractures of the anterolateral left eighth and ninth ribs.  Per Dr. Marin Olp, the patient has a poor performance status and is not a candidate for biopsy because she is oxygen-depend secondary to COPD. He recommended SBRT.  PREVIOUS RADIATION THERAPY: Yes. 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 The patient was to be treated with a total dose of 5,400 - 5,760 cGy, however her radiotherapy  had to be discontinued at a dose of 4,680 cGy secondary to severe esophagitis. (Dr. Shelby Dubin)  She was then seen by Dr. 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 She was treated with whole brain radiotherapy with a protracted fractionation scheme of 180 cGy a day x 20 fractions to the whole brain, for a dose of 3,600 cGy. This treatment took place from 11/08/1998 - 12/07/1998. Dr. Elba Barman had originally planned to also give the patient a boost treatment for the cerebellar metastasis with an additional 1,000 cGy in five 200 cGy fractions, for a total dose of 4,600 cGy. However, this did not take place secondary to her poor tolerance to radiotherapy. She had developed significant nausea, vomiting, headaches, and increased intracranial pressure for which she was hospitalized and placed on Decadron with significant improvement.  PAST MEDICAL HISTORY:  Past Medical History:  Diagnosis Date  . Acute respiratory failure following trauma and surgery ([place]) 08/26/2012  . Anxiety   . Aortic insufficiency with aortic stenosis 04/2017   TTE December 2019: Mild AS with severe regurgitation.  Mild LA dilation.;;  TEE January 2016: Severe-type III aortic regurgitation (holodiastolic flow reversal in the a sending aorta & vena contracta =6.   . CAD S/P percutaneous coronary angioplasty 11/2017   Proximal RCA PCI Synergy DES 3.5 mm x 16 mm (3.8 mm). Ost-mLM 30%. Ost-prox Cx 40%.  . Collagenous colitis   . Colon  adenomas 2011  . Constipation    Chronic abdominal pain and constipation  . COPD (chronic obstructive pulmonary disease) ([place])   . Depression   . Diverticulosis   . Esophageal stricture 11/08/2012   Ulcer noted in 2010  . GERD (gastroesophageal reflux disease)   . Helicobacter pylori gastritis 2010   Pylera Tx  . History of  cholecystectomy   . Hx of appendectomy   . Hx of cancer of lung 1999  . Hx of hysterectomy   . Hyperlipidemia   . Hypothyroidism   . Iron deficiency anemia due to chronic blood loss 10/01/2019  . Low back pain   . Non-STEMI (non-ST elevated myocardial infarction) ([place]) 11/2017   RCA PCI  . Osteoarthritis of knee    bilateral knee  . Seizures ([place])   . Stroke [facility])    CVA, hx of 97  . 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 ABDOMINAL HYSTERECTOMY     complete 1992  . ABDOMINAL SURGERY     Exploratory  . APPENDECTOMY    . BALLOON DILATION N/A 11/08/2012   Procedure: BALLOON DILATION;  Surgeon: Gatha Mayer, MD;  Location: [facility];  Service: Endoscopy;  Laterality: N/A;  . CHOLECYSTECTOMY    . COLONOSCOPY W/ BIOPSIES     multiple  . CORONARY STENT INTERVENTION N/A 06/28/2018   Procedure: CORONARY STENT INTERVENTION;  Surgeon: Martinique, Peter M, MD;  Location: [place] [facility];  Service: Cardiovascular;  Laterality: N/A;  90% prox RCA -> PCI with Synergy DES 3.5 mm x 16 mm (3.8 mm).  . CT CTA CORONARY W/CA SCORE W/CM &/OR WO/CM  05/2017   Coronary calcium score 2.9. Intermediate risk. Unusual noncalcified plaque in RCA --FFR negative  . ESOPHAGOGASTRODUODENOSCOPY     w/baloon x 2  . ESOPHAGOGASTRODUODENOSCOPY N/A 11/08/2012   Procedure: ESOPHAGOGASTRODUODENOSCOPY (EGD);  Surgeon: Gatha Mayer, MD;  Location: [facility];  Service: Endoscopy;  Laterality: N/A;  . ESOPHAGOGASTRODUODENOSCOPY (EGD) WITH PROPOFOL N/A 08/25/2019   Procedure: ESOPHAGOGASTRODUODENOSCOPY (EGD) WITH PROPOFOL;  Surgeon: Gatha Mayer, MD;  Location: [facility];  Service: Endoscopy;  Laterality: N/A;  . LEFT HEART CATH AND CORONARY ANGIOGRAPHY N/A 06/28/2018   Procedure: LEFT HEART CATH AND CORONARY ANGIOGRAPHY;  Surgeon: Martinique, Peter M, MD;  Location: [place] [facility];  Service: Cardiovascular:  90% prox RCA -> DES PCI. Ost-mLM 30%.  Ost-prox Cx 40%.   EF 55-65%. LVEDP 15 mmHg.   [name] MALONEY DILATION  08/25/2019   Procedure: MALONEY DILATION;  Surgeon: Gatha Mayer, MD;  Location: [facility];  Service: Endoscopy;;  . RIGHT HEART CATH N/A 07/11/2018   Procedure: RIGHT HEART CATH;  Surgeon: Leonie Man, MD;  Location: [place] CV [facility];;   Relatively normal Right Heart Cath pressures: PA pressure 26/14 mmHg-mean 20 mmHg; PCWP 13 mmHg.  RAP 6 mmHg, RVP 28/3 mmHg-EDP 10 mmHg. Severely reduced cardiac output and index by Fick: 3.63 and 2.07.  . TEE WITHOUT CARDIOVERSION N/A 07/11/2018   Procedure: TRANSESOPHAGEAL ECHOCARDIOGRAM (TEE);  Surgeon: [name], MD;  Location: [facility] ENDOSCOPY;  Service: Cardiology;;  Severe aortic regurgitation-type III. (suggested by holodiastolic flow reversal and descending aorta-vena contracta 6 mm).  . TOTAL HIP ARTHROPLASTY     Left  . TRANSTHORACIC ECHOCARDIOGRAM  05/2018   EF 60-65%.  No R WMA.  GR 1 DD.  Mild aortic stenosis with severe regurgitation.  Mild MR..  Only mild LV  dilation noted.  . TRANSTHORACIC ECHOCARDIOGRAM  12/2018    EF 60-65%. Mild LVH. Gr1 DD. Mod AoV thickening. Mod-Severe AI, ~ mlid AS.    FAMILY HISTORY:  Family History  Problem Relation Age of Onset  . Coronary artery disease Mother   . Diabetes Mother   . Hypertension Father   . Diabetes Son   . Coronary artery disease Other        grandmother, grandfather  . Kidney disease Other        aunt  . Kidney cancer Sister   . Colon cancer Neg Hx        colon    SOCIAL HISTORY:  Social History   Tobacco Use  . Smoking status: Current Every Day Smoker    Packs/day: 1.00    Years: 48.00    Pack years: 48.00    Types: Cigarettes  . Smokeless tobacco: Never Used  . Tobacco comment: 10/01/19 - 7 cigs/day  Substance Use Topics  . Alcohol use: No  . Drug use: No    ALLERGIES:  Allergies  Allergen Reactions  . Morphine     "Makes me go crazy."   . Nitrofuran Derivatives     ??confusion  .  Oxycodone Hcl Other (See Comments)    Knocked her out for 6 days  . Penicillins Hives    Has patient had a PCN reaction causing immediate rash, facial/tongue/throat swelling, SOB or lightheadedness with hypotension: unknown Has patient had a PCN reaction causing severe rash involving mucus membranes or skin necrosis: unknown Has patient had a PCN reaction that required hospitalization No Has patient had a PCN reaction occurring within the last 10 years: No If all of the above answers are "NO", then may proceed with Cephalosporin use.  Other reaction(s): Rash-Generalized  . Tape Other (See Comments)    Skin turns red and burns    MEDICATIONS:  Current Outpatient Medications  Medication Sig Dispense Refill  . albuterol (PROVENTIL) (2.5 MG/3ML) 0.083% nebulizer solution Take 3 mLs (2.5 mg total) by nebulization every 4 (four) hours as needed for wheezing or shortness of breath. 150 mL 5  . ALPRAZolam (XANAX) 0.25 MG tablet Take 1 tablet (0.25 mg total) by mouth at bedtime as needed for anxiety. 90 tablet 1  . bisacodyl (DULCOLAX) 5 MG EC tablet Take 2 tablets (10 mg total) by mouth at bedtime. (Patient taking differently: Take 10 mg by mouth daily as needed for moderate constipation. ) 30 tablet   . clopidogrel (PLAVIX) 75 MG tablet TAKE 1 TABLET (75 MG TOTAL) BY MOUTH DAILY WITH BREAKFAST. (Patient taking differently: Take 75 mg by mouth daily. ) 90 tablet 3  . diltiazem (CARDIZEM CD) 120 MG 24 hr capsule TAKE 1 CAPSULE BY MOUTH AT BEDTIME. (Patient taking differently: Take 120 mg by mouth daily. ) 90 capsule 2  . diphenhydrAMINE HCl, Sleep, (ZZZQUIL) 25 MG CAPS Take 50 mg by mouth at bedtime as needed (sleep).    . furosemide (LASIX) 20 MG tablet Take 1 tablet by mouth once daily (Patient taking differently: Take 20 mg by mouth daily. ) 90 tablet 2  . guaiFENesin (MUCINEX) 600 MG 12 hr tablet Take 1 tablet (600 mg total) by mouth 2 (two) times daily as needed for cough or to loosen phlegm. 14  tablet 0  . HYDROcodone-acetaminophen (NORCO) 10-325 MG tablet Take 1 tablet by mouth every 6 (six) hours as needed for severe pain. Please fill on or after 10/16/19 120 tablet 0  . hydroxypropyl  methylcellulose / hypromellose (ISOPTO TEARS / GONIOVISC) 2.5 % ophthalmic solution Place 1 drop into both eyes 3 (three) times daily as needed for dry eyes.    . isosorbide mononitrate (IMDUR) 30 MG 24 hr tablet Take 1 tablet by mouth once daily (Patient taking differently: Take 30 mg by mouth daily. ) 90 tablet 1  . ketoconazole (NIZORAL) 2 % cream Apply 1 application topically daily. 45 g 1  . levothyroxine (SYNTHROID) 150 MCG tablet Take 1 tablet (150 mcg total) by mouth daily. 90 tablet 3  . nitroGLYCERIN (NITROSTAT) 0.4 MG SL tablet DISSOLVE ONE TABLET UNDER THE TONGUE EVERY 5 MINUTES AS NEEDED FOR CHEST PAIN.  DO NOT EXCEED A TOTAL OF 3 DOSES IN 15 MINUTES (Patient taking differently: Place 0.4 mg under the tongue every 5 (five) minutes as needed for chest pain. ) 25 tablet 0  . OXYGEN Inhale 2.5 L/min into the lungs at bedtime.    . pantoprazole (PROTONIX) 40 MG tablet TAKE 1 TABLET BY MOUTH DAILY BEFORE BREAKFAST (Patient taking differently: Take 40 mg by mouth daily. ) 90 tablet 3  . PARoxetine (PAXIL) 40 MG tablet Take 1 tablet (40 mg total) by mouth daily. 90 tablet 3  . phenytoin (DILANTIN) 100 MG ER capsule TAKE 100 MG EVERY MORNING AND 200 MG EVERY NIGHT. (Patient taking differently: Take 100-200 mg by mouth See admin instructions. TAKE 100 MG EVERY MORNING AND 200 MG EVERY NIGHT.) 270 capsule 3  . promethazine (PHENERGAN) 25 MG tablet Take 1 tablet (25 mg total) by mouth every 8 (eight) hours as needed for nausea or vomiting. 90 tablet 1  . Respiratory Therapy Supplies (FLUTTER) DEVI As directed 1 each 0  . senna-docusate (SENOKOT S) 8.6-50 MG tablet Take 2 tablets by mouth at bedtime. 60 tablet 11  . SUMAtriptan (IMITREX) 100 MG tablet May repeat in 2 hours if headache persists or recurs. 9  tablet 3  . traZODone (DESYREL) 100 MG tablet TAKE 1 TABLET BY MOUTH EVERYDAY AT BEDTIME (Patient taking differently: Take 100 mg by mouth at bedtime as needed for sleep. ) 90 tablet 3  . Vitamin D, Ergocalciferol, (DRISDOL) 1.25 MG (50000 UT) CAPS capsule TAKE ONE CAPSULE BY MOUTH ONE TIME PER WEEK (Patient taking differently: Take 50,000 Units by mouth every Monday. ) 12 capsule 3  . albuterol (PROVENTIL HFA;VENTOLIN HFA) 108 (90 Base) MCG/ACT inhaler Inhale 2 puffs into the lungs every 6 (six) hours as needed for wheezing or shortness of breath. 1 Inhaler 5   No current facility-administered medications for this encounter.    REVIEW OF SYSTEMS:  A 10+ POINT REVIEW OF SYSTEMS WAS OBTAINED including neurology, dermatology, psychiatry, cardiac, respiratory, lymph, extremities, GI, GU, musculoskeletal, constitutional, reproductive, HEENT.  She denies any headaches.  Does report problems with short-term memory her whole brain radiation therapy.  Patient denies any significant pain within the chest area cough or hemoptysis.  Unable to walk due to left knee issues and prior left hip fracture   PHYSICAL EXAM:  height is 5\' 5"  (1.651 m) and weight is 165 lb (74.8 kg). Her temperature is 98.9 F (37.2 C). Her blood pressure is 117/43 (abnormal) and her pulse is 85. Her respiration is 18 and oxygen saturation is 98%.   General: Alert and oriented, in no acute distress.  Remains in a wheelchair for further evaluation..  She is wheelchair dependent in light of prior left hip fracture and left knee issues HEENT: Head is normocephalic. Extraocular movements are intact.  Neck:  Neck is supple, no palpable cervical or supraclavicular lymphadenopathy. Heart: Regular in rate and rhythm with no murmurs, rubs, or gallops. Chest: Clear to auscultation bilaterally, with no rhonchi, wheezes, or rales.  Tattoos in place on the right chest related to her previous radiation therapy for small cell lung cancer Abdomen: Soft,  nontender, nondistended, with no rigidity or guarding. Extremities: No cyanosis or edema. Lymphatics: see Neck Exam Skin: No concerning lesions. Musculoskeletal: symmetric strength and muscle tone throughout. Neurologic: Cranial nerves II through XII are grossly intact. No obvious focalities. Speech is fluent. Coordination is intact.  She reports short-term memory issues Psychiatric: Judgment and insight are intact. Affect is appropriate.   ECOG = 3  0 - Asymptomatic (Fully active, able to carry on all predisease activities without restriction)  1 - Symptomatic but completely ambulatory (Restricted in physically strenuous activity but ambulatory and able to carry out work of a light or sedentary nature. For example, light housework, office work)  2 - Symptomatic, <50% in bed during the day (Ambulatory and capable of all self care but unable to carry out any work activities. Up and about more than 50% of waking hours)  3 - Symptomatic, >50% in bed, but not bedbound (Capable of only limited self-care, confined to bed or chair 50% or more of waking hours)  4 - Bedbound (Completely disabled. Cannot carry on any self-care. Totally confined to bed or chair)  5 - Death   Eustace Pen MM, Creech RH, Tormey DC, et al. [phone]). "Toxicity and response criteria of the [facility]". [place] Oncol. 5 (6): 649-55  LABORATORY DATA:  Lab Results  Component Value Date   WBC 9.4 09/30/2019   HGB 11.0 (L) 09/30/2019   HCT 35.0 (L) 09/30/2019   MCV 96.4 09/30/2019   PLT 255 09/30/2019   NEUTROABS 6.9 09/30/2019   Lab Results  Component Value Date   NA 136 09/30/2019   K 4.9 09/30/2019   CL 100 09/30/2019   CO2 31 09/30/2019   GLUCOSE 112 (H) 09/30/2019   CREATININE 1.15 (H) 09/30/2019   CALCIUM 9.1 09/30/2019      RADIOGRAPHY: CT Chest Wo Contrast  Result Date: 09/30/2019 CLINICAL DATA:  Follow-up pulmonary nodule EXAM: CT CHEST WITHOUT CONTRAST TECHNIQUE: Multidetector  CT imaging of the chest was performed following the standard protocol without IV contrast. COMPARISON:  [facility] hospital CTA chest dated 09/24/2019 FINDINGS: Cardiovascular: The heart is normal in size. No pericardial effusion. No evidence thoracic aortic aneurysm. Atherosclerotic calcifications of the aortic arch. Coronary atherosclerosis of the right coronary artery. Mediastinum/Nodes: No suspicious mediastinal lymphadenopathy. Visualized thyroid is unremarkable. Lungs/Pleura: Radiation changes in the medial right upper lobe/paramediastinal region. 2.8 x 3.1 cm predominantly solid mass in the lingula (series 3/image 77), previously 2.8 x 2.8 cm, grossly unchanged. Associated 19 mm central solid component posteriorly (series 3/image 78). Additional 7 mm solid nodule anteriorly (series 3/image 76). This appearance is compatible with primary bronchogenic neoplasm. Additional mild nodularity predominantly in the posterior left upper lobe, including a dominant 9 mm ground-glass nodule (series 3/image 37), unchanged. Scarring/atelectasis in the posterior left lower lobe. Mild centrilobular and paraseptal emphysematous changes, upper lung predominant. No focal consolidation. No pleural effusion or pneumothorax. Upper Abdomen: Visualized upper abdomen is notable for prior cholecystectomy, vascular calcifications, and a 15 mm posterior left renal cyst (series 2/image 137), incompletely visualized. Musculoskeletal: Mild degenerative changes of the visualized thoracolumbar spine. IMPRESSION: 3.1 cm predominantly solid mass in the lingula, as described above, compatible with  primary bronchogenic neoplasm. Consider PET-CT and/or tissue sampling, as clinically warranted. Additional small left lung nodules, including a dominant 9 mm ground-glass nodule in the posterior left upper lobe, grossly unchanged. Radiation changes in the right hemithorax. Aortic Atherosclerosis (ICD10-I70.0) and Emphysema (ICD10-J43.9). Electronically  Signed   By: Julian Hy M.D.   On: 09/30/2019 12:45   NM PET Image Restag (PS) Skull Base To Thigh  Result Date: 10/15/2019 CLINICAL DATA:  Initial treatment strategy for non-small cell lung cancer. EXAM: NUCLEAR MEDICINE PET SKULL BASE TO THIGH TECHNIQUE: 8.2 mCi F-18 FDG was injected intravenously. Full-ring PET imaging was performed from the skull base to thigh after the radiotracer. CT data was obtained and used for attenuation correction and anatomic localization. Fasting blood glucose: 86 mg/dl COMPARISON:  CT chest 09/30/2019.  CT abdomen pelvis 02/28/2018. FINDINGS: Mediastinal blood pool activity: SUV max 2.3 Liver activity: SUV max NA NECK: Mildly asymmetric left tonsillar uptake. Left level 2 lymph node measures 8 mm (4/21) with an SUV max of 4.3. No additional hypermetabolic lymph nodes in the neck. Incidental CT findings: None. CHEST: A 7 mm nodule and adjacent 2.6 cm sub solid nodule in the lingula (8/32) have a collective SUV max of 8.4. Additional small pulmonary nodules, including a 7 mm apical left upper lobe nodule (8/14), are too small for PET resolution. Mild hypermetabolism associated with radiation scarring in the apical right hemithorax. No hypermetabolic mediastinal, hilar or axillary lymph nodes. Incidental CT findings: Atherosclerotic calcification of the aorta, aortic valve and coronary arteries. Heart is at the upper limits of normal in size to mildly enlarged. No pericardial effusion. ABDOMEN/PELVIS: No abnormal hypermetabolism in the liver, adrenal glands, spleen or pancreas. No hypermetabolic lymph nodes. Incidental CT findings: Liver is unremarkable. Cholecystectomy. Adrenal glands are unremarkable. Low-attenuation lesions in the kidneys measure up to 2.5 cm on the left and are likely cysts although definitive characterization is limited without post-contrast imaging. Spleen, pancreas, stomach and bowel are grossly unremarkable. Atherosclerotic calcification of the aorta  without aneurysm. SKELETON: Hypermetabolism is seen in association with acute nondisplaced fractures of the anterolateral left eighth and ninth ribs. No additional abnormal osseous hypermetabolism. Incidental CT findings: Left hip arthroplasty. 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 Degenerative changes in the spine. Old bilateral rib fractures. IMPRESSION: 1. Hypermetabolic 7 mm nodule and adjacent 2.6 cm sub solid nodule in the lingula, indicative of primary bronchogenic carcinoma. 2. Additional lung nodules, including a 7 mm apical left upper lobe nodule, are too small for PET resolution. 3. Mildly hypermetabolic left level 2 lymph node in the neck. Continued attention on follow-up exams is suggested. 4. Acute nondisplaced fractures of the anterolateral left eighth and ninth ribs. 5. Aortic atherosclerosis (ICD10-I70.0). Coronary artery calcification. Electronically Signed   By: Lorin Picket M.D.   On: 10/15/2019 15:03      IMPRESSION: Left lingular nodule - likely bronchogenic carcinoma   Patient will be a good candidate for definitive course of treatment with stereotactic body radiation therapy.  Given the patient's pulmonary status I would agree with Dr. Marin Olp the biopsy would be very risky in this situation.  She is certainly not a surgical candidate given her prior radiation along the right side and oxygen dependence.  I discussed treatment without tissue diagnosis with the patient and her husband and they agree with proceeding forward without  tissue diagnosis realizing this is a PET positive lesion and high likelihood of malignancy.  Quite amazing that she survived small  cell lung cancer with a large cerebellar brain metastasis, treated over 20 years ago.  Today, I talked to the patient and family about the findings and work-up thus far.  We discussed the natural history of lung cancer and general treatment, highlighting the role of radiotherapy (SBRT) in the  management.  We discussed the available radiation techniques, and focused on the details of logistics and delivery.  We reviewed the anticipated acute and late sequelae associated with radiation in this setting.  The patient was encouraged to ask questions that I answered to the best of my ability.  A patient consent form was discussed and signed.  We retained a copy for our records.  The patient would like to proceed with radiation and will be scheduled for CT simulation.  PLAN: Patient will proceed with CT simulation today with treatments to begin next week.  Anticipate 3 stereotactic body radiation treatments directed at the lesion in the lingula of the left lung.    ------------------------------------------------  Blair Promise, PhD, MD  This document serves as a record of services personally performed by Gery Pray, MD. It was created on his behalf by Clerance Lav, a trained medical scribe. The creation of this record is based on the scribe's personal observations and the provider's statements to them. This document has been checked and approved by the attending provider.

## 2019-10-29 ENCOUNTER — Ambulatory Visit
Admission: RE | Admit: 2019-10-29 | Discharge: 2019-10-29 | Disposition: A | Discharge status: Home / Self Care | Payer: Medicare HMO | Source: Ambulatory Visit | Attending: Radiation Oncology | Admitting: Radiation Oncology

## 2019-10-29 ENCOUNTER — Encounter: Payer: Self-pay | Admitting: Radiation Oncology

## 2019-10-29 ENCOUNTER — Other Ambulatory Visit: Payer: Self-pay

## 2019-10-29 VITALS — BP 117/43 | HR 85 | Temp 98.9°F | Resp 18 | Ht 65.0 in | Wt 165.0 lb

## 2019-10-29 DIAGNOSIS — R112 Nausea with vomiting, unspecified: Secondary | ICD-10-CM | POA: Diagnosis not present

## 2019-10-29 DIAGNOSIS — Z8673 Personal history of transient ischemic attack (TIA), and cerebral infarction without residual deficits: Secondary | ICD-10-CM | POA: Diagnosis not present

## 2019-10-29 DIAGNOSIS — C3410 Malignant neoplasm of upper lobe, unspecified bronchus or lung: Secondary | ICD-10-CM | POA: Diagnosis present

## 2019-10-29 DIAGNOSIS — E785 Hyperlipidemia, unspecified: Secondary | ICD-10-CM | POA: Insufficient documentation

## 2019-10-29 DIAGNOSIS — I2510 Atherosclerotic heart disease of native coronary artery without angina pectoris: Secondary | ICD-10-CM | POA: Insufficient documentation

## 2019-10-29 DIAGNOSIS — G932 Benign intracranial hypertension: Secondary | ICD-10-CM | POA: Diagnosis not present

## 2019-10-29 DIAGNOSIS — R69 Illness, unspecified: Secondary | ICD-10-CM | POA: Diagnosis not present

## 2019-10-29 DIAGNOSIS — R918 Other nonspecific abnormal finding of lung field: Secondary | ICD-10-CM | POA: Insufficient documentation

## 2019-10-29 DIAGNOSIS — Z85118 Personal history of other malignant neoplasm of bronchus and lung: Secondary | ICD-10-CM | POA: Diagnosis not present

## 2019-10-29 DIAGNOSIS — E039 Hypothyroidism, unspecified: Secondary | ICD-10-CM | POA: Diagnosis not present

## 2019-10-29 DIAGNOSIS — Z79899 Other long term (current) drug therapy: Secondary | ICD-10-CM | POA: Diagnosis not present

## 2019-10-29 DIAGNOSIS — Z510 Encounter for antineoplastic radiation therapy: Secondary | ICD-10-CM | POA: Diagnosis present

## 2019-10-29 DIAGNOSIS — J449 Chronic obstructive pulmonary disease, unspecified: Secondary | ICD-10-CM | POA: Insufficient documentation

## 2019-10-29 DIAGNOSIS — F329 Major depressive disorder, single episode, unspecified: Secondary | ICD-10-CM | POA: Diagnosis not present

## 2019-10-29 DIAGNOSIS — K219 Gastro-esophageal reflux disease without esophagitis: Secondary | ICD-10-CM | POA: Insufficient documentation

## 2019-10-29 DIAGNOSIS — R519 Headache, unspecified: Secondary | ICD-10-CM | POA: Insufficient documentation

## 2019-10-29 DIAGNOSIS — F418 Other specified anxiety disorders: Secondary | ICD-10-CM | POA: Diagnosis not present

## 2019-10-29 DIAGNOSIS — I700 Atherosclerosis of aorta: Secondary | ICD-10-CM | POA: Diagnosis not present

## 2019-10-29 DIAGNOSIS — Z923 Personal history of irradiation: Secondary | ICD-10-CM | POA: Diagnosis not present

## 2019-10-29 DIAGNOSIS — I252 Old myocardial infarction: Secondary | ICD-10-CM | POA: Diagnosis not present

## 2019-10-29 DIAGNOSIS — F17210 Nicotine dependence, cigarettes, uncomplicated: Secondary | ICD-10-CM | POA: Insufficient documentation

## 2019-10-29 DIAGNOSIS — C3490 Malignant neoplasm of unspecified part of unspecified bronchus or lung: Secondary | ICD-10-CM

## 2019-10-29 DIAGNOSIS — Z9981 Dependence on supplemental oxygen: Secondary | ICD-10-CM | POA: Diagnosis not present

## 2019-10-29 NOTE — Progress Notes (Signed)
  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  Identity was confirmed.  All relevant records and images related to the planned course of therapy were reviewed.  The patient freely provided informed written consent to proceed with treatment after reviewing the details related to the planned course of therapy. The consent form was witnessed and verified by the simulation staff.  Then, the patient was set-up in a stable reproducible  supine position for radiation therapy.  A BodyFix immobilization pillow was fabricated for reproducible positioning.  Then I personally applied the abdominal compression paddle to limit respiratory excursion.  4D respiratoy motion management CT images were obtained.  Surface markings were placed.  The CT images were loaded into the planning software.  Then, using Cine, MIP, and standard views, the internal target volume (ITV) and planning target volumes (PTV) were delinieated, and avoidance structures were contoured.  Treatment planning then occurred.  The radiation prescription was entered and confirmed.  A total of two complex treatment devices were fabricated in the form of the BodyFix immobilization pillow and a neck accuform cushion.  I have requested : 3D Simulation  I have requested a DVH of the following structures: Heart, Lungs, Esophagus, Chest Wall, Brachial Plexus, Major Blood Vessels, and targets.  PLAN:  The patient will receive 54 Gy in 3 fractions.  -----------------------------------  Blair Promise, PhD, MD  This document serves as a record of services personally performed by Gery Pray, MD. It was created on his behalf by Clerance Lav, a  trained medical scribe. The creation of this record is based on the scribe's personal observations and the provider's statements to them. This document has been checked and approved by the attending provider.

## 2019-10-29 NOTE — Progress Notes (Signed)
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 Marin Olp on 09/30/2019 after 10+ years. Per his note that day, the patient had a CT scan of her chest done at [facility] in March of 2020 that showed "a spot that was lung cancer", and so she was referred back to him for follow-up.   Patient no suitable for lung biopsy due to prolong history of COPD.  Tobacco/Marijuana/Snuff/ETOH use: yes, one ppd x 40 plus years.   Past/Anticipated interventions by cardiothoracic surgery, if any: no  Past/Anticipated interventions by medical oncology, if any: no  Signs/Symptoms  Weight changes, if any: no  Respiratory complaints, if any: Reports a cough. Explains sometimes the cough is productive with green sputum while other times it is dry. Reports with mild exertion she becomes SOB. Reports wearing oxygen 2.5 liters at bedtime.  Hemoptysis, if any: no  Pain issues, if any:  yes, left ribs and under left breast constant pain 7 on scale of 0-10. Reports pain is worse with deep breath. Reports pain is difficult to describe but she believes it feels like a throbbing sensation.  SAFETY ISSUES:  Prior radiation? yes  Pacemaker/ICD? no   Possible current pregnancy?no  Is the patient on methotrexate? no  Current Complaints / other details:  64 year old female. Married. Accompanied today by her husband.

## 2019-10-29 NOTE — Progress Notes (Signed)
See progress note under physician encounter. 

## 2019-11-03 DIAGNOSIS — M25519 Pain in unspecified shoulder: Secondary | ICD-10-CM | POA: Diagnosis not present

## 2019-11-03 DIAGNOSIS — J441 Chronic obstructive pulmonary disease with (acute) exacerbation: Secondary | ICD-10-CM | POA: Diagnosis not present

## 2019-11-03 DIAGNOSIS — I5032 Chronic diastolic (congestive) heart failure: Secondary | ICD-10-CM | POA: Diagnosis not present

## 2019-11-03 DIAGNOSIS — J9621 Acute and chronic respiratory failure with hypoxia: Secondary | ICD-10-CM | POA: Diagnosis not present

## 2019-11-03 DIAGNOSIS — J189 Pneumonia, unspecified organism: Secondary | ICD-10-CM | POA: Diagnosis not present

## 2019-11-03 DIAGNOSIS — J440 Chronic obstructive pulmonary disease with acute lower respiratory infection: Secondary | ICD-10-CM | POA: Diagnosis not present

## 2019-11-03 DIAGNOSIS — I110 Hypertensive heart disease with heart failure: Secondary | ICD-10-CM | POA: Diagnosis not present

## 2019-11-03 DIAGNOSIS — M542 Cervicalgia: Secondary | ICD-10-CM | POA: Diagnosis not present

## 2019-11-03 DIAGNOSIS — M87051 Idiopathic aseptic necrosis of right femur: Secondary | ICD-10-CM | POA: Diagnosis not present

## 2019-11-03 DIAGNOSIS — R918 Other nonspecific abnormal finding of lung field: Secondary | ICD-10-CM | POA: Diagnosis not present

## 2019-11-05 DIAGNOSIS — R918 Other nonspecific abnormal finding of lung field: Secondary | ICD-10-CM | POA: Diagnosis not present

## 2019-11-05 DIAGNOSIS — C3410 Malignant neoplasm of upper lobe, unspecified bronchus or lung: Secondary | ICD-10-CM | POA: Diagnosis not present

## 2019-11-05 DIAGNOSIS — Z510 Encounter for antineoplastic radiation therapy: Secondary | ICD-10-CM | POA: Diagnosis not present

## 2019-11-05 NOTE — Progress Notes (Signed)
  Radiation Oncology         (336) [phone] ________________________________  Name: TRINITI GRUETZMACHER MRN: [id number]  Date: 11/06/2019  DOB: [birth year]  Stereotactic Body Radiotherapy Treatment Procedure Note  NARRATIVE:  Kaylee Hansen was brought to the stereotactic radiation treatment machine and placed supine on the CT couch. The patient was set up for stereotactic body radiotherapy on the body fix pillow.  3D TREATMENT PLANNING AND DOSIMETRY:  The patient's radiation plan was reviewed and approved prior to starting treatment.  It showed 3-dimensional radiation distributions overlaid onto the planning CT.  The [facility] for the target structures as well as the organs at risk were reviewed. The documentation of this is filed in the radiation oncology EMR.  SIMULATION VERIFICATION:  The patient underwent CT imaging on the treatment unit.  These were carefully aligned to document that the ablative radiation dose would cover the target volume and maximally spare the nearby organs at risk according to the planned distribution.  SPECIAL TREATMENT PROCEDURE: Braylyn L Rickels received high dose ablative stereotactic body radiotherapy to the planned target volume without unforeseen complications. Treatment was delivered uneventfully. The high doses associated with stereotactic body radiotherapy and the significant potential risks require careful treatment set up and patient monitoring constituting a special treatment procedure   STEREOTACTIC TREATMENT MANAGEMENT:  Following delivery, the patient was evaluated clinically. The patient tolerated treatment without significant acute effects, and was discharged to home in stable condition.    PLAN: Continue treatment as planned.  ________________________________  Blair Promise, PhD, MD  This document serves as a record of services personally performed by Gery Pray, MD. It was created on his behalf by Clerance Lav, a trained medical scribe. The creation of  this record is based on the scribe's personal observations and the provider's statements to them. This document has been checked and approved by the attending provider.

## 2019-11-06 ENCOUNTER — Other Ambulatory Visit: Payer: Self-pay | Admitting: *Deleted

## 2019-11-06 ENCOUNTER — Ambulatory Visit
Admission: RE | Admit: 2019-11-06 | Discharge: 2019-11-06 | Disposition: A | Discharge status: Home / Self Care | Payer: Medicare HMO | Source: Ambulatory Visit | Attending: Radiation Oncology | Admitting: Radiation Oncology

## 2019-11-06 ENCOUNTER — Other Ambulatory Visit: Payer: Self-pay

## 2019-11-06 DIAGNOSIS — C3410 Malignant neoplasm of upper lobe, unspecified bronchus or lung: Secondary | ICD-10-CM

## 2019-11-06 DIAGNOSIS — C3490 Malignant neoplasm of unspecified part of unspecified bronchus or lung: Secondary | ICD-10-CM

## 2019-11-06 DIAGNOSIS — Z510 Encounter for antineoplastic radiation therapy: Secondary | ICD-10-CM | POA: Diagnosis not present

## 2019-11-06 DIAGNOSIS — D500 Iron deficiency anemia secondary to blood loss (chronic): Secondary | ICD-10-CM

## 2019-11-07 ENCOUNTER — Inpatient Hospital Stay (HOSPITAL_BASED_OUTPATIENT_CLINIC_OR_DEPARTMENT_OTHER): Payer: Medicare HMO | Admitting: Hematology & Oncology

## 2019-11-07 ENCOUNTER — Inpatient Hospital Stay: Payer: Medicare HMO | Attending: Hematology & Oncology

## 2019-11-07 ENCOUNTER — Encounter: Payer: Self-pay | Admitting: Hematology & Oncology

## 2019-11-07 ENCOUNTER — Ambulatory Visit: Payer: Medicare HMO | Admitting: Radiation Oncology

## 2019-11-07 ENCOUNTER — Inpatient Hospital Stay: Payer: Medicare HMO

## 2019-11-07 VITALS — BP 134/42 | HR 100 | Temp 97.1°F | Resp 18 | Ht 65.0 in | Wt 167.8 lb

## 2019-11-07 DIAGNOSIS — C3490 Malignant neoplasm of unspecified part of unspecified bronchus or lung: Secondary | ICD-10-CM

## 2019-11-07 DIAGNOSIS — C3492 Malignant neoplasm of unspecified part of left bronchus or lung: Secondary | ICD-10-CM | POA: Diagnosis not present

## 2019-11-07 DIAGNOSIS — R093 Abnormal sputum: Secondary | ICD-10-CM | POA: Insufficient documentation

## 2019-11-07 DIAGNOSIS — D500 Iron deficiency anemia secondary to blood loss (chronic): Secondary | ICD-10-CM | POA: Diagnosis not present

## 2019-11-07 LAB — CMP (CANCER CENTER ONLY)
ALT: 7 U/L (ref 0–44)
AST: 11 U/L — ABNORMAL LOW (ref 15–41)
Albumin: 3.9 g/dL (ref 3.5–5.0)
Alkaline Phosphatase: 100 U/L (ref 38–126)
Anion gap: 7 (ref 5–15)
BUN: 13 mg/dL (ref 8–23)
CO2: 30 mmol/L (ref 22–32)
Calcium: 9.4 mg/dL (ref 8.9–10.3)
Chloride: 104 mmol/L (ref 98–111)
Creatinine: 1.06 mg/dL — ABNORMAL HIGH (ref 0.44–1.00)
GFR, Est AFR Am: >60 mL/min (ref >60)
GFR, Estimated: 55 mL/min — ABNORMAL LOW (ref >60)
Glucose, Bld: 107 mg/dL — ABNORMAL HIGH (ref 70–99)
Potassium: 4.8 mmol/L (ref 3.5–5.1)
Sodium: 141 mmol/L (ref 135–145)
Total Bilirubin: 0.3 mg/dL (ref 0.3–1.2)
Total Protein: 6.6 g/dL (ref 6.5–8.1)

## 2019-11-07 LAB — CBC WITH DIFFERENTIAL (CANCER CENTER ONLY)
Abs Immature Granulocytes: 0.03 10*3/uL (ref 0.00–0.07)
Basophils Absolute: 0 10*3/uL (ref 0.0–0.1)
Basophils Relative: 1 %
Eosinophils Absolute: 0.1 10*3/uL (ref 0.0–0.5)
Eosinophils Relative: 1 %
HCT: 34.9 % — ABNORMAL LOW (ref 36.0–46.0)
Hemoglobin: 11 g/dL — ABNORMAL LOW (ref 12.0–15.0)
Immature Granulocytes: 0 %
Lymphocytes Relative: 8 %
Lymphs Abs: 0.7 10*3/uL (ref 0.7–4.0)
MCH: 30.8 pg (ref 26.0–34.0)
MCHC: 31.5 g/dL (ref 30.0–36.0)
MCV: 97.8 fL (ref 80.0–100.0)
Monocytes Absolute: 0.5 10*3/uL (ref 0.1–1.0)
Monocytes Relative: 6 %
Neutro Abs: 7.1 10*3/uL (ref 1.7–7.7)
Neutrophils Relative %: 84 %
Platelet Count: 302 10*3/uL (ref 150–400)
RBC: 3.57 MIL/uL — ABNORMAL LOW (ref 3.87–5.11)
RDW: 14.4 % (ref 11.5–15.5)
WBC Count: 8.5 10*3/uL (ref 4.0–10.5)
nRBC: 0 % (ref 0.0–0.2)

## 2019-11-07 LAB — RETICULOCYTES
Immature Retic Fract: 15.9 % (ref 2.3–15.9)
RBC.: 3.55 MIL/uL — ABNORMAL LOW (ref 3.87–5.11)
Retic Count, Absolute: 67.5 10*3/uL (ref 19.0–186.0)
Retic Ct Pct: 1.9 % (ref 0.4–3.1)

## 2019-11-07 MED ORDER — METHYLPREDNISOLONE SODIUM SUCC 125 MG IJ SOLR
80.0000 mg | Freq: Once | INTRAMUSCULAR | Status: AC
  Administered 2019-11-07: 80 mg via INTRAVENOUS

## 2019-11-07 MED ORDER — DEXTROSE 5 % IV SOLN
2.0000 g | INTRAVENOUS | Status: DC
  Administered 2019-11-07: 2 g via INTRAVENOUS
  Filled 2019-11-07 (×2): qty 20

## 2019-11-07 MED ORDER — SODIUM CHLORIDE 0.9 % IV SOLN
Freq: Once | INTRAVENOUS | Status: AC
  Filled 2019-11-07: qty 250

## 2019-11-07 MED ORDER — METHYLPREDNISOLONE SODIUM SUCC 125 MG IJ SOLR
INTRAMUSCULAR | Status: AC
  Filled 2019-11-07: qty 2

## 2019-11-07 MED ORDER — AZITHROMYCIN 250 MG PO TABS: ORAL_TABLET | ORAL | 0 refills | Status: DC

## 2019-11-07 NOTE — Progress Notes (Signed)
Hematology and Oncology Follow Up Visit  Kaylee Hansen [id number] June 16, 1956 64 y.o. 11/07/2019   Principle Diagnosis:  Left lingular nodule-likely bronchogenic carcinoma   Current Therapy:    SBRT -- start on 11/04/2019     Interim History:  Kaylee Hansen is back for a very quick follow-up. Unfortunately, it looks like she does have a bronchogenic carcinoma.  Even though we did not get a biopsy, she had a PET scan done on 10/15/2019.  This showed a hypermetabolic nodule in the lingula of the left lung.  This had an SUV of 8.4.  No other areas were noted.  There is no metabolic mediastinal or hilar lymph nodes.  Based on this, and the fact that she is a heavy smoker, it is apparent that this is a bronchogenic carcinoma.  She has seen Radiation Oncology.  They have started her on SBRT.  I think she has had 2 treatments to date.  She has a total of 4 treatments.  She is not feeling well.  She has some vomiting and diarrhea yesterday.  She is coughing up some greenish mucus.  She has terrible lung disease.  Her appetite is marginal.  She has had no rashes.  Overall, I would say her performance status is, at best, ECOG 2-3.   Medications:  Current Outpatient Medications:  .  albuterol (PROVENTIL HFA;VENTOLIN HFA) 108 (90 Base) MCG/ACT inhaler, Inhale 2 puffs into the lungs every 6 (six) hours as needed for wheezing or shortness of breath., Disp: 1 Inhaler, Rfl: 5 .  albuterol (PROVENTIL) (2.5 MG/3ML) 0.083% nebulizer solution, Take 3 mLs (2.5 mg total) by nebulization every 4 (four) hours as needed for wheezing or shortness of breath., Disp: 150 mL, Rfl: 5 .  ALPRAZolam (XANAX) 0.25 MG tablet, Take 1 tablet (0.25 mg total) by mouth at bedtime as needed for anxiety., Disp: 90 tablet, Rfl: 1 .  bisacodyl (DULCOLAX) 5 MG EC tablet, Take 2 tablets (10 mg total) by mouth at bedtime. (Patient taking differently: Take 10 mg by mouth daily as needed for moderate constipation. ), Disp: 30 tablet, Rfl:   .  clopidogrel (PLAVIX) 75 MG tablet, TAKE 1 TABLET (75 MG TOTAL) BY MOUTH DAILY WITH BREAKFAST. (Patient taking differently: Take 75 mg by mouth daily. ), Disp: 90 tablet, Rfl: 3 .  diltiazem (CARDIZEM CD) 120 MG 24 hr capsule, TAKE 1 CAPSULE BY MOUTH AT BEDTIME. (Patient taking differently: Take 120 mg by mouth daily. ), Disp: 90 capsule, Rfl: 2 .  diphenhydrAMINE HCl, Sleep, (ZZZQUIL) 25 MG CAPS, Take 50 mg by mouth at bedtime as needed (sleep)., Disp: , Rfl:  .  furosemide (LASIX) 20 MG tablet, Take 1 tablet by mouth once daily (Patient taking differently: Take 20 mg by mouth daily. ), Disp: 90 tablet, Rfl: 2 .  guaiFENesin (MUCINEX) 600 MG 12 hr tablet, Take 1 tablet (600 mg total) by mouth 2 (two) times daily as needed for cough or to loosen phlegm., Disp: 14 tablet, Rfl: 0 .  HYDROcodone-acetaminophen (NORCO) 10-325 MG tablet, Take 1 tablet by mouth every 6 (six) hours as needed for severe pain. Please fill on or after 10/16/19, Disp: 120 tablet, Rfl: 0 .  hydroxypropyl methylcellulose / hypromellose (ISOPTO TEARS / GONIOVISC) 2.5 % ophthalmic solution, Place 1 drop into both eyes 3 (three) times daily as needed for dry eyes., Disp: , Rfl:  .  isosorbide mononitrate (IMDUR) 30 MG 24 hr tablet, Take 1 tablet by mouth once daily (Patient taking differently: Take 30  mg by mouth daily. ), Disp: 90 tablet, Rfl: 1 .  ketoconazole (NIZORAL) 2 % cream, Apply 1 application topically daily., Disp: 45 g, Rfl: 1 .  levothyroxine (SYNTHROID) 150 MCG tablet, Take 1 tablet (150 mcg total) by mouth daily., Disp: 90 tablet, Rfl: 3 .  nitroGLYCERIN (NITROSTAT) 0.4 MG SL tablet, DISSOLVE ONE TABLET UNDER THE TONGUE EVERY 5 MINUTES AS NEEDED FOR CHEST PAIN.  DO NOT EXCEED A TOTAL OF 3 DOSES IN 15 MINUTES (Patient taking differently: Place 0.4 mg under the tongue every 5 (five) minutes as needed for chest pain. ), Disp: 25 tablet, Rfl: 0 .  OXYGEN, Inhale 2.5 L/min into the lungs at bedtime., Disp: , Rfl:  .   pantoprazole (PROTONIX) 40 MG tablet, TAKE 1 TABLET BY MOUTH DAILY BEFORE BREAKFAST (Patient taking differently: Take 40 mg by mouth daily. ), Disp: 90 tablet, Rfl: 3 .  PARoxetine (PAXIL) 40 MG tablet, Take 1 tablet (40 mg total) by mouth daily., Disp: 90 tablet, Rfl: 3 .  phenytoin (DILANTIN) 100 MG ER capsule, TAKE 100 MG EVERY MORNING AND 200 MG EVERY NIGHT. (Patient taking differently: Take 100-200 mg by mouth See admin instructions. TAKE 100 MG EVERY MORNING AND 200 MG EVERY NIGHT.), Disp: 270 capsule, Rfl: 3 .  promethazine (PHENERGAN) 25 MG tablet, Take 1 tablet (25 mg total) by mouth every 8 (eight) hours as needed for nausea or vomiting., Disp: 90 tablet, Rfl: 1 .  Respiratory Therapy Supplies (FLUTTER) DEVI, As directed, Disp: 1 each, Rfl: 0 .  senna-docusate (SENOKOT S) 8.6-50 MG tablet, Take 2 tablets by mouth at bedtime., Disp: 60 tablet, Rfl: 11 .  SUMAtriptan (IMITREX) 100 MG tablet, May repeat in 2 hours if headache persists or recurs., Disp: 9 tablet, Rfl: 3 .  traZODone (DESYREL) 100 MG tablet, TAKE 1 TABLET BY MOUTH EVERYDAY AT BEDTIME (Patient taking differently: Take 100 mg by mouth at bedtime as needed for sleep. ), Disp: 90 tablet, Rfl: 3 .  Vitamin D, Ergocalciferol, (DRISDOL) 1.25 MG (50000 UT) CAPS capsule, TAKE ONE CAPSULE BY MOUTH ONE TIME PER WEEK (Patient taking differently: Take 50,000 Units by mouth every Monday. ), Disp: 12 capsule, Rfl: 3  Allergies:  Allergies  Allergen Reactions  . Nitrofuran Derivatives Other (See Comments)    confusion  . Oxycodone Hcl Other (See Comments)    Knocked her out for 6 days  . Morphine Other (See Comments)    "Makes me go crazy."   . Penicillins Hives    Has patient had a PCN reaction causing immediate rash, facial/tongue/throat swelling, SOB or lightheadedness with hypotension: unknown Has patient had a PCN reaction causing severe rash involving mucus membranes or skin necrosis: unknown Has patient had a PCN reaction that  required hospitalization No Has patient had a PCN reaction occurring within the last 10 years: No If all of the above answers are "NO", then may proceed with Cephalosporin use.  Other reaction(s): Rash-Generalized  . Tape Other (See Comments)    Skin turns red and burns    Past Medical History, Surgical history, Social history, and Family History were reviewed and updated.  Review of Systems: Review of Systems  Constitutional: Positive for fatigue.  HENT:  Negative.   Eyes: Negative.   Respiratory: Positive for cough.   Cardiovascular: Positive for chest pain.  Gastrointestinal: Negative.   Endocrine: Negative.   Genitourinary: Negative.    Musculoskeletal: Positive for arthralgias and myalgias.  Skin: Negative.   Neurological: Negative.   Hematological: Negative.  Psychiatric/Behavioral: Negative.     Physical Exam:  vitals were not taken for this visit.   Wt Readings from Last 3 Encounters:  10/29/19 165 lb (74.8 kg)  10/01/19 166 lb (75.3 kg)  10/01/19 166 lb (75.3 kg)    Physical Exam Vitals reviewed.  HENT:     Head: Normocephalic and atraumatic.  Eyes:     Pupils: Pupils are equal, round, and reactive to light.  Cardiovascular:     Rate and Rhythm: Normal rate and regular rhythm.     Heart sounds: Normal heart sounds.     Comments: Cardiac exam shows a regular rate and rhythm.  She has no murmurs, rubs or bruits. Pulmonary:     Effort: Pulmonary effort is normal.     Breath sounds: Normal breath sounds.     Comments: Pulmonary exam shows scattered rhonchi bilaterally.  She has some wheezes.  She has a decent air movement. Abdominal:     General: Bowel sounds are normal.     Palpations: Abdomen is soft.  Musculoskeletal:        General: No tenderness or deformity. Normal range of motion.     Cervical back: Normal range of motion.  Lymphadenopathy:     Cervical: No cervical adenopathy.  Skin:    General: Skin is warm and dry.     Findings: No erythema  or rash.     Comments: Skin exam shows scattered ecchymoses.  Neurological:     Mental Status: She is alert and oriented to person, place, and time.  Psychiatric:        Behavior: Behavior normal.        Thought Content: Thought content normal.        Judgment: Judgment normal.      Lab Results  Component Value Date   WBC 8.5 11/07/2019   HGB 11.0 (L) 11/07/2019   HCT 34.9 (L) 11/07/2019   MCV 97.8 11/07/2019   PLT 302 11/07/2019     Chemistry      Component Value Date/Time   NA 136 09/30/2019 1021   NA 144 07/08/2018 1059   K 4.9 09/30/2019 1021   CL 100 09/30/2019 1021   CO2 31 09/30/2019 1021   BUN 15 09/30/2019 1021   BUN 12 07/08/2018 1059   CREATININE 1.15 (H) 09/30/2019 1021      Component Value Date/Time   CALCIUM 9.1 09/30/2019 1021   ALKPHOS 143 (H) 09/30/2019 1021   AST 17 09/30/2019 1021   ALT 41 09/30/2019 1021   BILITOT 0.3 09/30/2019 1021       Impression and Plan: Ms. Fornes is a 64 year old white female.  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  I would like to believe that the SBRT will help her out.  I would not plan for any follow-up PET scans probably for a good 2 months after she completes the SBRT.  We will going give her a dose of Rocephin in the office today.  I will then have her take Zithromax as an outpatient.  Hopefully this will help improve her pulmonary function and decrease her production of purulent mucus.  We will plan to have her come back after her next PET scan.  I will set the PET scan up for July.  Somehow, it would not surprise me if she ends up in the hospital between now and then with pulmonary issues.  [name], MD 5/14/202110:47 AM

## 2019-11-07 NOTE — Progress Notes (Signed)
Confirmed allergy status with patient. She has tolerated Ceftin without problems.  Pt to receive Rocephin today.  Hardie Pulley, PharmD, BCPS, BCOP

## 2019-11-09 NOTE — Progress Notes (Signed)
  Radiation Oncology         [phone] ________________________________  Name: KATRENIA ALKINS MRN: [id number]  Date: 11/10/2019  DOB: [birth year]  Stereotactic Body Radiotherapy Treatment Procedure Note  NARRATIVE:  SARINAH DOETSCH was brought to the stereotactic radiation treatment machine and placed supine on the CT couch. The patient was set up for stereotactic body radiotherapy on the body fix pillow.  3D TREATMENT PLANNING AND DOSIMETRY:  The patient's radiation plan was reviewed and approved prior to starting treatment.  It showed 3-dimensional radiation distributions overlaid onto the planning CT.  The [facility] for the target structures as well as the organs at risk were reviewed. The documentation of this is filed in the radiation oncology EMR.  SIMULATION VERIFICATION:  The patient underwent CT imaging on the treatment unit.  These were carefully aligned to document that the ablative radiation dose would cover the target volume and maximally spare the nearby organs at risk according to the planned distribution.  SPECIAL TREATMENT PROCEDURE: Julita L Buskey received high dose ablative stereotactic body radiotherapy to the planned target volume without unforeseen complications. Treatment was delivered uneventfully. The high doses associated with stereotactic body radiotherapy and the significant potential risks require careful treatment set up and patient monitoring constituting a special treatment procedure   STEREOTACTIC TREATMENT MANAGEMENT:  Following delivery, the patient was evaluated clinically. The patient tolerated treatment without significant acute effects, and was discharged to home in stable condition.    PLAN: Continue treatment as planned.  ________________________________  Blair Promise, PhD, MD  This document serves as a record of services personally performed by Gery Pray, MD. It was created on his behalf by Clerance Lav, a trained medical scribe. The creation of  this record is based on the scribe's personal observations and the provider's statements to them. This document has been checked and approved by the attending provider.

## 2019-11-10 ENCOUNTER — Encounter: Payer: Self-pay | Admitting: *Deleted

## 2019-11-10 ENCOUNTER — Ambulatory Visit
Admission: RE | Admit: 2019-11-10 | Discharge: 2019-11-10 | Disposition: A | Discharge status: Home / Self Care | Payer: Medicare HMO | Source: Ambulatory Visit | Attending: Radiation Oncology | Admitting: Radiation Oncology

## 2019-11-10 ENCOUNTER — Other Ambulatory Visit: Payer: Self-pay

## 2019-11-10 DIAGNOSIS — C3410 Malignant neoplasm of upper lobe, unspecified bronchus or lung: Secondary | ICD-10-CM | POA: Diagnosis not present

## 2019-11-10 DIAGNOSIS — Z510 Encounter for antineoplastic radiation therapy: Secondary | ICD-10-CM | POA: Diagnosis not present

## 2019-11-10 LAB — IRON AND TIBC
Iron: 82 ug/dL (ref 41–142)
Saturation Ratios: 30 % (ref 21–57)
TIBC: 270 ug/dL (ref 236–444)
UIBC: 188 ug/dL (ref 120–384)

## 2019-11-10 LAB — FERRITIN: Ferritin: 43 ng/mL (ref 11–307)

## 2019-11-10 LAB — LACTATE DEHYDROGENASE: LDH: 154 U/L (ref 98–192)

## 2019-11-12 NOTE — Progress Notes (Signed)
  Radiation Oncology         [phone] ________________________________  Name: Kaylee Hansen MRN: [id number]  Date: 11/13/2019  DOB: 1955-12-23  Stereotactic Body Radiotherapy Treatment Procedure Note  NARRATIVE:  Kaylee Hansen was brought to the stereotactic radiation treatment machine and placed supine on the CT couch. The patient was set up for stereotactic body radiotherapy on the body fix pillow.  3D TREATMENT PLANNING AND DOSIMETRY:  The patient's radiation plan was reviewed and approved prior to starting treatment.  It showed 3-dimensional radiation distributions overlaid onto the planning CT.  The [facility] for the target structures as well as the organs at risk were reviewed. The documentation of this is filed in the radiation oncology EMR.  SIMULATION VERIFICATION:  The patient underwent CT imaging on the treatment unit.  These were carefully aligned to document that the ablative radiation dose would cover the target volume and maximally spare the nearby organs at risk according to the planned distribution.  SPECIAL TREATMENT PROCEDURE: Kaylee Hansen received high dose ablative stereotactic body radiotherapy to the planned target volume without unforeseen complications. Treatment was delivered uneventfully. The high doses associated with stereotactic body radiotherapy and the significant potential risks require careful treatment set up and patient monitoring constituting a special treatment procedure   STEREOTACTIC TREATMENT MANAGEMENT:  Following delivery, the patient was evaluated clinically. The patient tolerated treatment without significant acute effects, and was discharged to home in stable condition.    PLAN: Routine follow-up with radiation oncology in one month.  ________________________________  Blair Promise, PhD, MD  This document serves as a record of services personally performed by Gery Pray, MD. It was created on his behalf by Clerance Lav, a trained medical  scribe. The creation of this record is based on the scribe's personal observations and the provider's statements to them. This document has been checked and approved by the attending provider.

## 2019-11-13 ENCOUNTER — Other Ambulatory Visit: Payer: Self-pay

## 2019-11-13 ENCOUNTER — Encounter: Payer: Self-pay | Admitting: Radiation Oncology

## 2019-11-13 ENCOUNTER — Ambulatory Visit
Admission: RE | Admit: 2019-11-13 | Discharge: 2019-11-13 | Disposition: A | Discharge status: Home / Self Care | Payer: Medicare HMO | Source: Ambulatory Visit | Attending: Radiation Oncology | Admitting: Radiation Oncology

## 2019-11-13 VITALS — BP 134/56 | HR 98 | Temp 97.2°F | Resp 20 | Wt 167.0 lb

## 2019-11-13 DIAGNOSIS — R918 Other nonspecific abnormal finding of lung field: Secondary | ICD-10-CM | POA: Diagnosis not present

## 2019-11-13 DIAGNOSIS — Z510 Encounter for antineoplastic radiation therapy: Secondary | ICD-10-CM | POA: Diagnosis not present

## 2019-11-13 DIAGNOSIS — R69 Illness, unspecified: Secondary | ICD-10-CM | POA: Diagnosis not present

## 2019-11-13 DIAGNOSIS — C3410 Malignant neoplasm of upper lobe, unspecified bronchus or lung: Secondary | ICD-10-CM

## 2019-11-17 DIAGNOSIS — C3490 Malignant neoplasm of unspecified part of unspecified bronchus or lung: Secondary | ICD-10-CM | POA: Diagnosis not present

## 2019-11-20 ENCOUNTER — Other Ambulatory Visit: Payer: Self-pay | Admitting: *Deleted

## 2019-11-20 DIAGNOSIS — M25519 Pain in unspecified shoulder: Secondary | ICD-10-CM | POA: Diagnosis not present

## 2019-11-20 DIAGNOSIS — J189 Pneumonia, unspecified organism: Secondary | ICD-10-CM | POA: Diagnosis not present

## 2019-11-20 DIAGNOSIS — J9621 Acute and chronic respiratory failure with hypoxia: Secondary | ICD-10-CM | POA: Diagnosis not present

## 2019-11-20 DIAGNOSIS — J441 Chronic obstructive pulmonary disease with (acute) exacerbation: Secondary | ICD-10-CM | POA: Diagnosis not present

## 2019-11-20 DIAGNOSIS — R918 Other nonspecific abnormal finding of lung field: Secondary | ICD-10-CM | POA: Diagnosis not present

## 2019-11-20 DIAGNOSIS — M542 Cervicalgia: Secondary | ICD-10-CM | POA: Diagnosis not present

## 2019-11-20 DIAGNOSIS — I110 Hypertensive heart disease with heart failure: Secondary | ICD-10-CM | POA: Diagnosis not present

## 2019-11-20 DIAGNOSIS — J440 Chronic obstructive pulmonary disease with acute lower respiratory infection: Secondary | ICD-10-CM | POA: Diagnosis not present

## 2019-11-20 DIAGNOSIS — M87051 Idiopathic aseptic necrosis of right femur: Secondary | ICD-10-CM | POA: Diagnosis not present

## 2019-11-20 DIAGNOSIS — I5032 Chronic diastolic (congestive) heart failure: Secondary | ICD-10-CM | POA: Diagnosis not present

## 2019-11-20 NOTE — Patient Outreach (Signed)
[facility]) Care Management  11/20/2019  Kaylee Hansen Oct 25, 1955 [id number]   Call placed to member to follow up on management of cancer treatments, no answer.  HIPAA compliant voice message left.  Will follow up within the next 3-4 business days.  Valente David, [place], MSN [place] [phone]

## 2019-11-25 ENCOUNTER — Other Ambulatory Visit: Payer: Self-pay | Admitting: Internal Medicine

## 2019-11-26 ENCOUNTER — Other Ambulatory Visit: Payer: Self-pay | Admitting: *Deleted

## 2019-11-26 NOTE — Patient Outreach (Signed)
[facility]) Care Management  11/26/2019  Kaylee Hansen [birth year] [id number]   Outreach attempt #2, successful.  Call placed to member to follow up on management of cancer treatments.  She report she had her 4th radiation treatment on 5/20 however has been experiencing a headache and weakness since her 3rd treatment on 5/17.  She has not discussed this with PCP, state she just figured it would go away.  She has periodically taken Hydrocodone and Aleve for relief but state neither one has fully relived it.  Advised to contact provider, she verbalizes understanding.  State she will have a follow up PET scan in a few weeks but her tumor is reportedly shrinking.  She still has productive cough but state she is using nebulizer and inhalers instructed.  Denies any urgent concerns, will follow up within the next 2 weeks.  [facility] CM Care Plan Problem One     Most Recent Value  Care Plan Problem One  Risk for readmission related to cancer diagnosis as evidenced by multiple hospitalization over the last 3 months  Role Documenting the Problem One  Care Management [place] for Problem One  Active  [facility] Long Term Goal   Member will not be readmitted to hospital within the next 31 days  [facility] Long Term Goal Start Date  10/08/19  [facility] Long Term Goal Met Date  11/26/19  [facility] CM Short Term Goal #1   Member will report relief of headache within the next 2 weeks  [facility] CM Short Term Goal #1 Start Date  11/26/19  [facility] for Short Term Goal #1  Interventions for headache relief discussed with member, encouraged to contact MD office to report symptoms  [facility] CM Short Term Goal #2   Member will report decrease in smoking within the next 4 weeks  [facility] CM Short Term Goal #2 Start Date  10/08/19  [facility] CM Short Term Goal #2 Met Date  11/26/19  [facility] CM Short Term Goal #3  Member will have appointment with radiology oncologist within the next 3 weeks  [facility] CM Short Term Goal #3 Start Date   10/22/19  [facility] CM Short Term Goal #3 Met Date  11/26/19       Valente David, RN, MSN [place] [phone]

## 2019-12-01 ENCOUNTER — Telehealth: Payer: Self-pay

## 2019-12-01 NOTE — Telephone Encounter (Signed)
Patient called stating she has had dizziness since her 3rd radiation tx and now she has it most of the time. She asked if this was related to the radiation in her chest. Pt. Advised no but will advise Dr. Sondra Come. Encouraged patient to call her primary Oncologist to see if she needs to be seen. Advised patient RN will call back after talking with Dr. Sondra Come.

## 2019-12-03 NOTE — Progress Notes (Incomplete)
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 She did report a mild headache, intermittent shortness of breath on 2.5 L supplemental oxygen, and occasional but chronic productive cough with green sputum. She denied any skin irritation.  Plan: The patient will follow-up with radiation oncology in one month.  ________________________________________________   Blair Promise, PhD, MD  This document serves as a record of services personally performed by Gery Pray, MD. It was created on his behalf by Clerance Lav, a trained medical scribe. The creation of this record is based on the scribe's personal observations and the provider's statements to them. This document has been checked and approved by the attending provider.

## 2019-12-04 NOTE — Telephone Encounter (Signed)
Patient called and there was no answer. No voice mail left. After talking with Dr. Sondra Come he reports he talked with this patient already. No further calls needed at this time.

## 2019-12-08 ENCOUNTER — Encounter: Payer: Self-pay | Admitting: Internal Medicine

## 2019-12-08 ENCOUNTER — Ambulatory Visit (INDEPENDENT_AMBULATORY_CARE_PROVIDER_SITE_OTHER): Payer: Medicare HMO | Admitting: Internal Medicine

## 2019-12-08 ENCOUNTER — Other Ambulatory Visit: Payer: Self-pay

## 2019-12-08 DIAGNOSIS — I10 Essential (primary) hypertension: Secondary | ICD-10-CM

## 2019-12-08 DIAGNOSIS — R300 Dysuria: Secondary | ICD-10-CM | POA: Diagnosis not present

## 2019-12-08 DIAGNOSIS — J441 Chronic obstructive pulmonary disease with (acute) exacerbation: Secondary | ICD-10-CM | POA: Diagnosis not present

## 2019-12-08 DIAGNOSIS — C3490 Malignant neoplasm of unspecified part of unspecified bronchus or lung: Secondary | ICD-10-CM

## 2019-12-08 MED ORDER — CEFUROXIME AXETIL 250 MG PO TABS: 250.0000 mg | ORAL_TABLET | Freq: Two times a day (BID) | ORAL | 0 refills | Status: DC

## 2019-12-08 MED ORDER — ALPRAZOLAM 0.25 MG PO TABS: 0.2500 mg | ORAL_TABLET | Freq: Every evening | ORAL | 1 refills | Status: DC | PRN

## 2019-12-08 NOTE — Progress Notes (Signed)
Subjective:  Patient ID: Kaylee Hansen, female    DOB: [birth year]  Age: 64 y.o. MRN: [id number]  CC: No chief complaint on file.   HPI Kaylee Hansen presents for lung cancer, chronic pain, UTIs f/u C/o cough w/green sputum, UTI sx's On XRT  - done for now  Outpatient Medications Prior to Visit  Medication Sig Dispense Refill  . albuterol (PROVENTIL) (2.5 MG/3ML) 0.083% nebulizer solution Take 3 mLs (2.5 mg total) by nebulization every 4 (four) hours as needed for wheezing or shortness of breath. 150 mL 5  . ALPRAZolam (XANAX) 0.25 MG tablet Take 1 tablet (0.25 mg total) by mouth at bedtime as needed for anxiety. 90 tablet 1  . azithromycin (ZITHROMAX Z-PAK) 250 MG tablet Take 2 pills on Saturday, then 1 pill a day for 4 days 6 each 0  . bisacodyl (DULCOLAX) 5 MG EC tablet Take 2 tablets (10 mg total) by mouth at bedtime. (Patient taking differently: Take 10 mg by mouth daily as needed for moderate constipation. ) 30 tablet   . clopidogrel (PLAVIX) 75 MG tablet TAKE 1 TABLET (75 MG TOTAL) BY MOUTH DAILY WITH BREAKFAST. (Patient taking differently: Take 75 mg by mouth daily. ) 90 tablet 3  . diltiazem (CARDIZEM CD) 120 MG 24 hr capsule TAKE 1 CAPSULE BY MOUTH AT BEDTIME. (Patient taking differently: Take 120 mg by mouth daily. ) 90 capsule 2  . diphenhydrAMINE HCl, Sleep, (ZZZQUIL) 25 MG CAPS Take 50 mg by mouth at bedtime as needed (sleep).    . furosemide (LASIX) 20 MG tablet Take 1 tablet by mouth once daily (Patient taking differently: Take 20 mg by mouth daily. ) 90 tablet 2  . guaiFENesin (MUCINEX) 600 MG 12 hr tablet Take 1 tablet (600 mg total) by mouth 2 (two) times daily as needed for cough or to loosen phlegm. 14 tablet 0  . HYDROcodone-acetaminophen (NORCO) 10-325 MG tablet Take 1 tablet by mouth every 6 (six) hours as needed for severe pain. Please fill on or after 10/16/19 120 tablet 0  . hydroxypropyl methylcellulose / hypromellose (ISOPTO TEARS / GONIOVISC) 2.5 % ophthalmic  solution Place 1 drop into both eyes 3 (three) times daily as needed for dry eyes.    . isosorbide mononitrate (IMDUR) 30 MG 24 hr tablet Take 1 tablet by mouth once daily (Patient taking differently: Take 30 mg by mouth daily. ) 90 tablet 1  . ketoconazole (NIZORAL) 2 % cream APPLY TO AFFECTED AREA EVERY DAY 45 g 1  . levothyroxine (SYNTHROID) 150 MCG tablet Take 1 tablet (150 mcg total) by mouth daily. 90 tablet 3  . nitroGLYCERIN (NITROSTAT) 0.4 MG SL tablet DISSOLVE ONE TABLET UNDER THE TONGUE EVERY 5 MINUTES AS NEEDED FOR CHEST PAIN.  DO NOT EXCEED A TOTAL OF 3 DOSES IN 15 MINUTES 25 tablet 0  . OXYGEN Inhale 2.5 L/min into the lungs at bedtime.    . pantoprazole (PROTONIX) 40 MG tablet TAKE 1 TABLET BY MOUTH DAILY BEFORE BREAKFAST (Patient taking differently: Take 40 mg by mouth daily. ) 90 tablet 3  . PARoxetine (PAXIL) 40 MG tablet Take 1 tablet (40 mg total) by mouth daily. 90 tablet 3  . phenytoin (DILANTIN) 100 MG ER capsule TAKE 100 MG EVERY MORNING AND 200 MG EVERY NIGHT. (Patient taking differently: Take 100-200 mg by mouth See admin instructions. TAKE 100 MG EVERY MORNING AND 200 MG EVERY NIGHT.) 270 capsule 3  . promethazine (PHENERGAN) 25 MG tablet Take 1 tablet (25 mg  total) by mouth every 8 (eight) hours as needed for nausea or vomiting. 90 tablet 1  . Respiratory Therapy Supplies (FLUTTER) DEVI As directed 1 each 0  . senna-docusate (SENOKOT S) 8.6-50 MG tablet Take 2 tablets by mouth at bedtime. 60 tablet 11  . SUMAtriptan (IMITREX) 100 MG tablet May repeat in 2 hours if headache persists or recurs. 9 tablet 3  . traZODone (DESYREL) 100 MG tablet TAKE 1 TABLET BY MOUTH EVERYDAY AT BEDTIME (Patient taking differently: Take 100 mg by mouth at bedtime as needed for sleep. ) 90 tablet 3  . Vitamin D, Ergocalciferol, (DRISDOL) 1.25 MG (50000 UT) CAPS capsule TAKE ONE CAPSULE BY MOUTH ONE TIME PER WEEK (Patient taking differently: Take 50,000 Units by mouth every Monday. ) 12 capsule 3    . albuterol (PROVENTIL HFA;VENTOLIN HFA) 108 (90 Base) MCG/ACT inhaler Inhale 2 puffs into the lungs every 6 (six) hours as needed for wheezing or shortness of breath. 1 Inhaler 5   No facility-administered medications prior to visit.    ROS: Review of Systems  Constitutional: Positive for fatigue. Negative for activity change, appetite change, chills and unexpected weight change.  HENT: Negative for congestion, mouth sores and sinus pressure.   Eyes: Negative for visual disturbance.  Respiratory: Positive for cough. Negative for chest tightness.   Gastrointestinal: Negative for abdominal pain and nausea.  Genitourinary: Positive for frequency and urgency. Negative for difficulty urinating and vaginal pain.  Musculoskeletal: Positive for arthralgias, back pain, gait problem, neck pain and neck stiffness.  Skin: Negative for pallor and rash.  Neurological: Positive for dizziness and weakness. Negative for tremors, numbness and headaches.  Psychiatric/Behavioral: Positive for decreased concentration. Negative for confusion and sleep disturbance.    Objective:  BP (!) 128/58 (BP Location: Left Arm, Patient Position: Sitting, Cuff Size: Normal)   Pulse 92   Temp 98.4 F (36.9 C) (Oral)   Ht 5\' 5"  (1.651 m)   Wt 169 lb 9 oz (76.9 kg)   SpO2 94%   BMI 28.22 kg/m   BP Readings from Last 3 Encounters:  12/08/19 (!) 128/58  11/13/19 (!) 134/56  11/07/19 (!) 134/42    Wt Readings from Last 3 Encounters:  12/08/19 169 lb 9 oz (76.9 kg)  11/13/19 167 lb (75.8 kg)  11/07/19 167 lb 12.8 oz (76.1 kg)    Physical Exam Constitutional:      General: She is not in acute distress.    Appearance: She is well-developed.  HENT:     Head: Normocephalic.     Right Ear: External ear normal.     Left Ear: External ear normal.     Nose: Nose normal.  Eyes:     General:        Right eye: No discharge.        Left eye: No discharge.     Conjunctiva/sclera: Conjunctivae normal.     Pupils:  Pupils are equal, round, and reactive to light.  Neck:     Thyroid: No thyromegaly.     Vascular: No JVD.     Trachea: No tracheal deviation.  Cardiovascular:     Rate and Rhythm: Normal rate and regular rhythm.     Heart sounds: Normal heart sounds.  Pulmonary:     Effort: No respiratory distress.     Breath sounds: No stridor. No wheezing.  Abdominal:     General: Bowel sounds are normal. There is no distension.     Palpations: Abdomen is soft. There is  no mass.     Tenderness: There is no abdominal tenderness. There is no guarding or rebound.  Musculoskeletal:        General: Tenderness present.     Cervical back: Normal range of motion and neck supple.  Lymphadenopathy:     Cervical: No cervical adenopathy.  Skin:    Findings: No erythema or rash.  Neurological:     Cranial Nerves: No cranial nerve deficit.     Motor: Weakness present. No abnormal muscle tone.     Coordination: Coordination abnormal.     Gait: Gait abnormal.     Deep Tendon Reflexes: Reflexes normal.  Psychiatric:        Behavior: Behavior normal.        Thought Content: Thought content normal.        Judgment: Judgment normal.   in a w/c  Lab Results  Component Value Date   WBC 8.5 11/07/2019   HGB 11.0 (L) 11/07/2019   HCT 34.9 (L) 11/07/2019   PLT 302 11/07/2019   GLUCOSE 107 (H) 11/07/2019   CHOL 128 06/29/2018   TRIG 184 (H) 06/29/2018   HDL 35 (L) 06/29/2018   LDLDIRECT 187.0 07/14/2015   LDLCALC 56 06/29/2018   ALT 7 11/07/2019   AST 11 (L) 11/07/2019   NA 141 11/07/2019   K 4.8 11/07/2019   CL 104 11/07/2019   CREATININE 1.06 (H) 11/07/2019   BUN 13 11/07/2019   CO2 30 11/07/2019   TSH 1.84 01/01/2019   INR 0.9 09/22/2019   HGBA1C  09/17/2008    5.0 (NOTE)   The ADA recommends the following therapeutic goal for glycemic   control related to Hgb A1C measurement:   Goal of Therapy:   < 7.0% Hgb A1C   Reference: American Diabetes Association: Clinical Practice   Recommendations  2008, Diabetes Care,  2008, 31:(Suppl 1).    No results found.  Assessment & Plan:    Walker Kehr, MD

## 2019-12-08 NOTE — Assessment & Plan Note (Signed)
S/p XRT

## 2019-12-08 NOTE — Assessment & Plan Note (Signed)
BP Readings from Last 3 Encounters:  12/08/19 (!) 128/58  11/13/19 (!) 134/56  11/07/19 (!) 134/42

## 2019-12-08 NOTE — Assessment & Plan Note (Signed)
Ceftin bid

## 2019-12-08 NOTE — Assessment & Plan Note (Addendum)
Ceftin bid for URI and UTI

## 2019-12-10 ENCOUNTER — Other Ambulatory Visit: Payer: Self-pay | Admitting: *Deleted

## 2019-12-10 NOTE — Patient Outreach (Signed)
[facility]) Care Management  12/10/2019  Kaylee Hansen Apr 25, 1956 [id number]   Call placed to member to follow up on complaints of dizziness.  She report the symptoms are much better now but still have some episodes.  She was seen by PCP on 6/14, aware of intermittent dizziness, per chart, radiology oncologist also aware.  She will have follow up with radiology oncologist on 7/8 and with regular oncologist on 7/30.  She anticipate a follow up PET scan during that time.  She is still smoking, remains with productive cough.  Discussed smoking cessation as well as monitoring oxygen levels and deep breathing.  State she will attempt to improve on this as well as checking oxygen levels.  Denies any urgent concerns, will follow up within the next month.  [facility] CM Care Plan Problem One     Most Recent Value  Care Plan Problem One Knowledge deficit regarding diagnosis of lung cancer  Role Documenting the Problem One Care Management Coordinator  Care Plan for Problem One Active  [facility] Long Term Goal  Member will report no complications or hospitalizations for cancer complications over the next 31 days  [facility] Long Term Goal Start Date 12/10/19  Interventions for Problem One Long Term Goal Upcoming oncology appointments reviewed with member, educated on importance of close follow up and adhering to plan of care in effort to decrease risk of admission  [facility] CM Short Term Goal #1  Member will report relief of headache within the next 2 weeks  [facility] CM Short Term Goal #1 Start Date 11/26/19  [facility] CM Short Term Goal #1 Met Date 12/10/19  [facility] CM Short Term Goal #2  Member will report decrease in smoking over the next 4 weeks  [facility] CM Short Term Goal #2 Start Date 12/10/19  Interventions for Short Term Goal #2 Member educated on smoking cessation.  Encouraged to monitor oxygen levels and use deep breathing exercises     Valente David, Therapist, sports, MSN [place] [phone]

## 2019-12-18 ENCOUNTER — Other Ambulatory Visit: Payer: Self-pay | Admitting: Internal Medicine

## 2019-12-18 DIAGNOSIS — C3490 Malignant neoplasm of unspecified part of unspecified bronchus or lung: Secondary | ICD-10-CM | POA: Diagnosis not present

## 2019-12-24 DIAGNOSIS — M25562 Pain in left knee: Secondary | ICD-10-CM | POA: Diagnosis not present

## 2019-12-24 DIAGNOSIS — M1712 Unilateral primary osteoarthritis, left knee: Secondary | ICD-10-CM | POA: Diagnosis not present

## 2019-12-24 DIAGNOSIS — M24662 Ankylosis, left knee: Secondary | ICD-10-CM | POA: Diagnosis not present

## 2019-12-24 DIAGNOSIS — M24661 Ankylosis, right knee: Secondary | ICD-10-CM | POA: Diagnosis not present

## 2019-12-31 ENCOUNTER — Ambulatory Visit: Payer: Medicare HMO | Admitting: Internal Medicine

## 2019-12-31 NOTE — Progress Notes (Signed)
Radiation Oncology         [phone] ________________________________  Name: Kaylee Hansen MRN: [id number]  Date: 01/01/2020  DOB: Mar 19, 1956  Follow-Up Visit Note  CC: Plotnikov, [name], MD  [name], MD    ICD-10-CM   1. 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  Radiation Treatment Dates: 11/06/2019 through 11/13/2019 Site Technique Total Dose (Gy) Dose per Fx (Gy) Completed Fx Beam Energies  Lung, Left: Lung_Lt IMRT 54/54 18 3/3 6XFFF    Narrative:  The patient returns today for routine follow-up. Since the end of treatment, the patient had called our office on 12/01/2019 complaining of persistent dizziness since her third radiation treatment. I spoke with her regarding that issue and it has since improved.  On review of systems, she reports chronic intermittent abdominal pain. She denies cough or hemoptysis.  She uses her inhaler approximately once a day.  Continues to use supplemental oxygen at 2-1/2 L but at this time does not have oxygen in place in her O2 sats are 96% in the clinic.                    ALLERGIES:  is allergic to nitrofuran derivatives, oxycodone hcl, morphine, penicillins, and tape.  Meds: Current Outpatient Medications  Medication Sig Dispense Refill  . albuterol (PROVENTIL HFA;VENTOLIN HFA) 108 (90 Base) MCG/ACT inhaler Inhale 2 puffs into the lungs every 6 (six) hours as needed for wheezing or shortness of breath. 1 Inhaler 5  . albuterol (PROVENTIL) (2.5 MG/3ML) 0.083% nebulizer solution Take 3 mLs (2.5 mg total) by nebulization every 4 (four) hours as needed for wheezing or shortness of breath. 150 mL 5  . ALPRAZolam (XANAX) 0.25 MG tablet Take 1 tablet (0.25 mg total) by mouth at bedtime as needed for anxiety. 90 tablet 1  . azithromycin (ZITHROMAX Z-PAK) 250 MG tablet Take 2 pills on Saturday, then 1  pill a day for 4 days 6 each 0  . bisacodyl (DULCOLAX) 5 MG EC tablet Take 2 tablets (10 mg total) by mouth at bedtime. (Patient taking differently: Take 10 mg by mouth daily as needed for moderate constipation. ) 30 tablet   . cefUROXime (CEFTIN) 250 MG tablet Take 1 tablet (250 mg total) by mouth 2 (two) times daily with a meal. 20 tablet 0  . clopidogrel (PLAVIX) 75 MG tablet TAKE 1 TABLET (75 MG TOTAL) BY MOUTH DAILY WITH BREAKFAST. (Patient taking differently: Take 75 mg by mouth daily. ) 90 tablet 3  . diltiazem (CARDIZEM CD) 120 MG 24 hr capsule TAKE 1 CAPSULE BY MOUTH AT BEDTIME. (Patient taking differently: Take 120 mg by mouth daily. ) 90 capsule 2  . diphenhydrAMINE HCl, Sleep, (ZZZQUIL) 25 MG CAPS Take 50 mg by mouth at bedtime as needed (sleep).    . furosemide (LASIX) 20 MG tablet Take 1 tablet by mouth once daily (Patient taking differently: Take 20 mg by mouth daily. ) 90 tablet 2  . guaiFENesin (MUCINEX) 600 MG 12 hr tablet Take 1 tablet (600 mg total) by mouth 2 (two) times daily as needed for cough or to loosen phlegm. 14 tablet 0  . HYDROcodone-acetaminophen (NORCO) 10-325 MG tablet Take 1 tablet by mouth every 6 (six) hours as needed for severe pain. Please fill on or after 10/16/19 120 tablet 0  . hydroxypropyl methylcellulose /  hypromellose (ISOPTO TEARS / GONIOVISC) 2.5 % ophthalmic solution Place 1 drop into both eyes 3 (three) times daily as needed for dry eyes.    . isosorbide mononitrate (IMDUR) 30 MG 24 hr tablet Take 1 tablet by mouth once daily (Patient taking differently: Take 30 mg by mouth daily. ) 90 tablet 1  . ketoconazole (NIZORAL) 2 % cream APPLY TO AFFECTED AREA EVERY DAY 45 g 1  . levothyroxine (SYNTHROID) 150 MCG tablet Take 1 tablet (150 mcg total) by mouth daily. 90 tablet 3  . nitroGLYCERIN (NITROSTAT) 0.4 MG SL tablet DISSOLVE ONE TABLET UNDER THE TONGUE EVERY 5 MINUTES AS NEEDED FOR CHEST PAIN.  DO NOT EXCEED A TOTAL OF 3 DOSES IN 15 MINUTES 25 tablet 0  .  OXYGEN Inhale 2.5 L/min into the lungs at bedtime.    . pantoprazole (PROTONIX) 40 MG tablet TAKE 1 TABLET BY MOUTH DAILY BEFORE BREAKFAST (Patient taking differently: Take 40 mg by mouth daily. ) 90 tablet 3  . PARoxetine (PAXIL) 40 MG tablet Take 1 tablet (40 mg total) by mouth daily. 90 tablet 3  . phenytoin (DILANTIN) 100 MG ER capsule TAKE 100 MG EVERY MORNING AND 200 MG EVERY NIGHT. (Patient taking differently: Take 100-200 mg by mouth See admin instructions. TAKE 100 MG EVERY MORNING AND 200 MG EVERY NIGHT.) 270 capsule 3  . promethazine (PHENERGAN) 25 MG tablet Take 1 tablet (25 mg total) by mouth every 8 (eight) hours as needed for nausea or vomiting. 90 tablet 1  . Respiratory Therapy Supplies (FLUTTER) DEVI As directed 1 each 0  . senna-docusate (SENOKOT S) 8.6-50 MG tablet Take 2 tablets by mouth at bedtime. 60 tablet 11  . SUMAtriptan (IMITREX) 100 MG tablet May repeat in 2 hours if headache persists or recurs. 9 tablet 3  . traZODone (DESYREL) 100 MG tablet TAKE 1 TABLET BY MOUTH EVERYDAY AT BEDTIME (Patient taking differently: Take 100 mg by mouth at bedtime as needed for sleep. ) 90 tablet 3  . Vitamin D, Ergocalciferol, (DRISDOL) 1.25 MG (50000 UT) CAPS capsule TAKE ONE CAPSULE BY MOUTH ONE TIME PER WEEK (Patient taking differently: Take 50,000 Units by mouth every Monday. ) 12 capsule 3   No current facility-administered medications for this encounter.    Physical Findings: The patient is in no acute distress. Patient is alert and oriented.  height is 5\' 5"  (1.651 m) and weight is 170 lb (77.1 kg). Her temperature is 98.6 F (37 C). Her blood pressure is 133/42 (abnormal) and her pulse is 93. Her respiration is 24 (abnormal) and oxygen saturation is 96%.    Lungs show wheezing throughout both lungs.  Good air movement however. heart has regular rate and rhythm. No palpable cervical, supraclavicular, or axillary adenopathy. Abdomen soft, non-tender, normal bowel sounds.   Lab  Findings: Lab Results  Component Value Date   WBC 8.5 11/07/2019   HGB 11.0 (L) 11/07/2019   HCT 34.9 (L) 11/07/2019   MCV 97.8 11/07/2019   PLT 302 11/07/2019    Radiographic Findings: No results found.  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  No evidence of recurrence on clinical exam today  Plan: PET scan is scheduled for 01/21/2020. She is then scheduled to follow-up with Dr. Marin Olp on 01/23/2020. She will follow-up with radiation oncology on as-needed basis in light of her follow-up with Dr. Marin Olp.    ____________________________________   Blair Promise, PhD, MD  This document serves as a record of services personally performed by Gery Pray, MD. It was created on his behalf by Clerance Lav, a trained medical scribe. The creation of this record is based on the scribe's personal observations and the provider's statements to them. This document has been checked and approved by the attending provider.

## 2020-01-01 ENCOUNTER — Other Ambulatory Visit: Payer: Self-pay

## 2020-01-01 ENCOUNTER — Encounter: Payer: Self-pay | Admitting: Radiation Oncology

## 2020-01-01 ENCOUNTER — Ambulatory Visit
Admission: RE | Admit: 2020-01-01 | Discharge: 2020-01-01 | Disposition: A | Discharge status: Home / Self Care | Payer: Medicare HMO | Source: Ambulatory Visit | Attending: Radiation Oncology | Admitting: Radiation Oncology

## 2020-01-01 DIAGNOSIS — Z79899 Other long term (current) drug therapy: Secondary | ICD-10-CM | POA: Diagnosis not present

## 2020-01-01 DIAGNOSIS — R109 Unspecified abdominal pain: Secondary | ICD-10-CM | POA: Insufficient documentation

## 2020-01-01 DIAGNOSIS — R918 Other nonspecific abnormal finding of lung field: Secondary | ICD-10-CM | POA: Diagnosis not present

## 2020-01-01 DIAGNOSIS — Z923 Personal history of irradiation: Secondary | ICD-10-CM | POA: Insufficient documentation

## 2020-01-01 DIAGNOSIS — C3410 Malignant neoplasm of upper lobe, unspecified bronchus or lung: Secondary | ICD-10-CM

## 2020-01-01 NOTE — Progress Notes (Signed)
Patient here for a 1 month f/u with Dr. Sondra Come. She reports an occasional cough. Pt. Reports being short of breath all of the time. Uses O2 at night on 2.5 Liters.  Reports occasional problems with swallowing food. Appetite is good.  BP (!) 133/42 (BP Location: Left Arm, Patient Position: Sitting, Cuff Size: Large)   Pulse 93   Temp 98.6 F (37 C)   Resp (!) 24   Ht 5\' 5"  (1.651 m)   Wt 170 lb (77.1 kg)   SpO2 96%   BMI 28.29 kg/m    Wt Readings from Last 3 Encounters:  01/01/20 170 lb (77.1 kg)  12/08/19 169 lb 9 oz (76.9 kg)  11/13/19 167 lb (75.8 kg)

## 2020-01-05 ENCOUNTER — Encounter: Payer: Self-pay | Admitting: Internal Medicine

## 2020-01-05 ENCOUNTER — Ambulatory Visit (INDEPENDENT_AMBULATORY_CARE_PROVIDER_SITE_OTHER): Payer: Medicare HMO | Admitting: Internal Medicine

## 2020-01-05 ENCOUNTER — Other Ambulatory Visit: Payer: Self-pay

## 2020-01-05 DIAGNOSIS — R300 Dysuria: Secondary | ICD-10-CM | POA: Diagnosis not present

## 2020-01-05 MED ORDER — CEFUROXIME AXETIL 250 MG PO TABS: 250.0000 mg | ORAL_TABLET | Freq: Two times a day (BID) | ORAL | 0 refills | Status: DC

## 2020-01-05 NOTE — Progress Notes (Signed)
   Subjective:   Patient ID: Kaylee Hansen, female    DOB: 1955-12-07, 64 y.o.   MRN: [id number]  HPI The patient is a 64 YO female coming in for possible urinary tract infection. Having chronic symptoms of infection for some long time more than months not years. Denies fevers or chills. Having a lot of pain and burning inguinally and with urination. She is using cold packs for the pain. Denies nausea or vomiting. Overall it did improve when given 1 week of antibiotics from PCP last month but within days of any antibiotic symptoms have returned. She has seen urology in the past for same symptoms and is not sure what workup they did. Her husband is present for visit and helps to provide history. He is not sure what they did either but thinks that they tried 2-3 different antibiotics all of which helped for a short time. She denies blood in urine. Denies rash on inguinal area. Overall is very frustrated with the chronicity and intractable nature of this problem.   Review of Systems  Constitutional: Negative.   HENT: Negative.   Eyes: Negative.   Respiratory: Negative.  Negative for cough, chest tightness and shortness of breath.   Cardiovascular: Negative.  Negative for chest pain, palpitations and leg swelling.  Gastrointestinal: Negative for abdominal distention, abdominal pain, constipation, diarrhea, nausea and vomiting.  Genitourinary: Positive for dysuria, frequency and urgency.  Musculoskeletal: Negative.   Skin: Negative.   Neurological: Negative.   Psychiatric/Behavioral: Negative.     Objective:  Physical Exam Constitutional:      Appearance: She is well-developed.  HENT:     Head: Normocephalic and atraumatic.  Cardiovascular:     Rate and Rhythm: Normal rate and regular rhythm.  Pulmonary:     Effort: Pulmonary effort is normal. No respiratory distress.     Breath sounds: Normal breath sounds. No wheezing or rales.  Abdominal:     General: Bowel sounds are normal. There is  no distension.     Palpations: Abdomen is soft.     Tenderness: There is no abdominal tenderness. There is no rebound.  Musculoskeletal:     Cervical back: Normal range of motion.     Comments: Uncomfortable throughout visit moving legs trying to find comfortable position  Skin:    General: Skin is warm and dry.  Neurological:     Mental Status: She is alert and oriented to person, place, and time.     Coordination: Coordination normal.     Vitals:   01/05/20 1532  BP: 134/82  Pulse: 93  Temp: 98.8 F (37.1 C)  TempSrc: Oral  SpO2: 97%  Height: 5\' 5"  (1.651 m)    This visit occurred during the SARS-CoV-2 public health emergency.  Safety protocols were in place, including screening questions prior to the visit, additional usage of staff PPE, and extensive cleaning of exam room while observing appropriate contact time as indicated for disinfecting solutions.   Assessment & Plan:  Visit time 25 minutes in face to face communication with patient and coordination of care, additional 5 minutes spent in record review, coordination or care, ordering tests, communicating/referring to other healthcare professionals, documenting in medical records all on the same day of the visit for total time 30 minutes spent on the visit.

## 2020-01-05 NOTE — Patient Instructions (Signed)
We have sent in ceftin (cefuroxime) to take 1 pill twice a day for 1 month. Come back and see Dr. Alain Marion in about 3 weeks to talk about if you need something for prevention of urinary tract all the time.

## 2020-01-07 ENCOUNTER — Other Ambulatory Visit: Payer: Self-pay | Admitting: *Deleted

## 2020-01-07 NOTE — Patient Outreach (Addendum)
[facility]) Care Management  01/07/2020  Kaylee Hansen 1956-02-05 [id number]   Call placed to member to follow up on management of cancer and COPD.  During conversation, wheezes are audible.  State she has already taken used her inhaler and nebulizer this morning.  She uses home O2 at night and when needed.  She has not checked to see what her oxygen level is at this time.  Although she is wheezing, denies any significant shortness of breath.  Discussed importance of deep breathing and when to contact MD with concerns.  She has repeat PET Scan on 7/28 and follow up with oncology on 7/30.  Denies any urgent concerns, will contact MD office if she becomes any worse as she has stated she does not want to go back to the hospital.  Encouraged to contact this care manager with question.  Will follow up within the next month.  Goals Addressed              This Visit's Progress     I want my breathing to get better (pt-stated)        CARE PLAN ENTRY (see longtitudinal plan of care for additional care plan information)  Current Barriers:   Knowledge deficits related to basic understanding of COPD disease process  Knowledge deficit related to basic understanding of how to use inhalers and how inhaled medications work   Case Manager Clinical Goal(s):  Over the next 28 days patient will report using inhalers as prescribed including rinsing mouth after use  Over the next 45 days, patient will be able to verbalize understanding of COPD action plan and when to seek appropriate levels of medical care  Over the next 28 days, patient will engage in lite exercise as tolerated to build/regain stamina and strength and reduce shortness of breath through activity tolerance   Interventions:   Provided patient with COPD action plan and reinforced importance of daily self assessment  Provided instruction about proper use of medications used for management of COPD including  inhalers  Advised patient to self assesses COPD action plan zone and make appointment with provider if in the yellow zone for 48 hours without improvement.  Patient Self Care Activities:   Takes medications as prescribed including inhalers  Self assesses COPD action plan zone and makes appointment with provider if in the yellow zone for 48 hours without improvement.  Utilizes infection prevention strategies to reduce risk of respiratory infection   Initial goal documentation       Kaylee David, RN, MSN [place] [phone]

## 2020-01-07 NOTE — Assessment & Plan Note (Addendum)
She may have chronic interstitial cystitis, given rx for 1 month of ceftin which she has tolerated well in past and advised to see PCP within that month to assess if preventative antibiotic of some kind is appropriate. Advised her and husband that they should also schedule another visit with urology to assess change in symptoms given they do not know what work up has been done already and records were not in media tab for review during visit.

## 2020-01-16 DIAGNOSIS — M5416 Radiculopathy, lumbar region: Secondary | ICD-10-CM | POA: Diagnosis not present

## 2020-01-16 DIAGNOSIS — M1711 Unilateral primary osteoarthritis, right knee: Secondary | ICD-10-CM | POA: Diagnosis not present

## 2020-01-16 DIAGNOSIS — M25561 Pain in right knee: Secondary | ICD-10-CM | POA: Diagnosis not present

## 2020-01-16 DIAGNOSIS — M25551 Pain in right hip: Secondary | ICD-10-CM | POA: Diagnosis not present

## 2020-01-17 DIAGNOSIS — C3490 Malignant neoplasm of unspecified part of unspecified bronchus or lung: Secondary | ICD-10-CM | POA: Diagnosis not present

## 2020-01-18 ENCOUNTER — Other Ambulatory Visit: Payer: Self-pay | Admitting: Cardiology

## 2020-01-21 ENCOUNTER — Other Ambulatory Visit: Payer: Self-pay

## 2020-01-21 ENCOUNTER — Encounter (HOSPITAL_COMMUNITY)
Admission: RE | Admit: 2020-01-21 | Discharge: 2020-01-21 | Disposition: A | Discharge status: Home / Self Care | Payer: Medicare HMO | Source: Ambulatory Visit | Attending: Hematology & Oncology | Admitting: Hematology & Oncology

## 2020-01-21 DIAGNOSIS — C3490 Malignant neoplasm of unspecified part of unspecified bronchus or lung: Secondary | ICD-10-CM | POA: Diagnosis not present

## 2020-01-21 DIAGNOSIS — R911 Solitary pulmonary nodule: Secondary | ICD-10-CM | POA: Diagnosis not present

## 2020-01-21 DIAGNOSIS — N281 Cyst of kidney, acquired: Secondary | ICD-10-CM | POA: Diagnosis not present

## 2020-01-21 DIAGNOSIS — I700 Atherosclerosis of aorta: Secondary | ICD-10-CM | POA: Diagnosis not present

## 2020-01-21 LAB — GLUCOSE, CAPILLARY: Glucose-Capillary: 97 mg/dL (ref 70–99)

## 2020-01-21 MED ORDER — FLUDEOXYGLUCOSE F - 18 (FDG) INJECTION
8.6000 | Freq: Once | INTRAVENOUS | Status: AC | PRN
  Administered 2020-01-21: 8.6 via INTRAVENOUS

## 2020-01-22 ENCOUNTER — Other Ambulatory Visit: Payer: Self-pay | Admitting: Cardiology

## 2020-01-23 ENCOUNTER — Inpatient Hospital Stay: Payer: Medicare HMO | Attending: Hematology & Oncology | Admitting: Hematology & Oncology

## 2020-01-23 ENCOUNTER — Encounter: Payer: Self-pay | Admitting: Hematology & Oncology

## 2020-01-23 ENCOUNTER — Inpatient Hospital Stay: Payer: Medicare HMO

## 2020-01-23 ENCOUNTER — Telehealth: Payer: Self-pay | Admitting: Hematology & Oncology

## 2020-01-23 ENCOUNTER — Other Ambulatory Visit: Payer: Self-pay

## 2020-01-23 VITALS — BP 135/39 | HR 85 | Temp 98.6°F | Resp 18 | Wt 167.0 lb

## 2020-01-23 DIAGNOSIS — R911 Solitary pulmonary nodule: Secondary | ICD-10-CM | POA: Diagnosis present

## 2020-01-23 DIAGNOSIS — F17210 Nicotine dependence, cigarettes, uncomplicated: Secondary | ICD-10-CM | POA: Diagnosis not present

## 2020-01-23 DIAGNOSIS — D500 Iron deficiency anemia secondary to blood loss (chronic): Secondary | ICD-10-CM

## 2020-01-23 DIAGNOSIS — C3490 Malignant neoplasm of unspecified part of unspecified bronchus or lung: Secondary | ICD-10-CM | POA: Insufficient documentation

## 2020-01-23 DIAGNOSIS — R69 Illness, unspecified: Secondary | ICD-10-CM | POA: Diagnosis not present

## 2020-01-23 DIAGNOSIS — J449 Chronic obstructive pulmonary disease, unspecified: Secondary | ICD-10-CM | POA: Diagnosis not present

## 2020-01-23 LAB — CMP (CANCER CENTER ONLY)
ALT: 7 U/L (ref 0–44)
AST: 10 U/L — ABNORMAL LOW (ref 15–41)
Albumin: 4 g/dL (ref 3.5–5.0)
Alkaline Phosphatase: 113 U/L (ref 38–126)
Anion gap: 7 (ref 5–15)
BUN: 14 mg/dL (ref 8–23)
CO2: 30 mmol/L (ref 22–32)
Calcium: 9.4 mg/dL (ref 8.9–10.3)
Chloride: 101 mmol/L (ref 98–111)
Creatinine: 0.99 mg/dL (ref 0.44–1.00)
GFR, Est AFR Am: >60 mL/min (ref >60)
GFR, Estimated: 59 mL/min — ABNORMAL LOW (ref >60)
Glucose, Bld: 97 mg/dL (ref 70–99)
Potassium: 4.7 mmol/L (ref 3.5–5.1)
Sodium: 138 mmol/L (ref 135–145)
Total Bilirubin: 0.2 mg/dL — ABNORMAL LOW (ref 0.3–1.2)
Total Protein: 7.1 g/dL (ref 6.5–8.1)

## 2020-01-23 LAB — CBC WITH DIFFERENTIAL (CANCER CENTER ONLY)
Abs Immature Granulocytes: 0.02 10*3/uL (ref 0.00–0.07)
Basophils Absolute: 0.1 10*3/uL (ref 0.0–0.1)
Basophils Relative: 1 %
Eosinophils Absolute: 0.1 10*3/uL (ref 0.0–0.5)
Eosinophils Relative: 2 %
HCT: 37.6 % (ref 36.0–46.0)
Hemoglobin: 11.8 g/dL — ABNORMAL LOW (ref 12.0–15.0)
Immature Granulocytes: 0 %
Lymphocytes Relative: 18 %
Lymphs Abs: 1.2 10*3/uL (ref 0.7–4.0)
MCH: 30.9 pg (ref 26.0–34.0)
MCHC: 31.4 g/dL (ref 30.0–36.0)
MCV: 98.4 fL (ref 80.0–100.0)
Monocytes Absolute: 0.5 10*3/uL (ref 0.1–1.0)
Monocytes Relative: 8 %
Neutro Abs: 4.8 10*3/uL (ref 1.7–7.7)
Neutrophils Relative %: 71 %
Platelet Count: 276 10*3/uL (ref 150–400)
RBC: 3.82 MIL/uL — ABNORMAL LOW (ref 3.87–5.11)
RDW: 14.5 % (ref 11.5–15.5)
WBC Count: 6.8 10*3/uL (ref 4.0–10.5)
nRBC: 0 % (ref 0.0–0.2)

## 2020-01-23 MED ORDER — METHYLPREDNISOLONE 4 MG PO TBPK
ORAL_TABLET | ORAL | 3 refills | Status: DC
Start: 2020-01-23 — End: 2020-04-08

## 2020-01-23 NOTE — Telephone Encounter (Signed)
sw patient to confirm appts 9/30 at 12 pm. Central scheduling will contact pt to sch PET scan in Sept per 7/30 LOS

## 2020-01-23 NOTE — Progress Notes (Signed)
Hematology and Oncology Follow Up Visit  Kaylee Hansen [id number] 08-08-1955 64 y.o. 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  As always, she comes in a wheelchair.  Her husband comes with her.  The hot humid weather is not doing well with her.  She has a horrible COPD.  She really cannot go outside in this hot weather.  Thankfully, she has inhalers and some nebulizers at home.  We did do a PET scan on her.  This was done last week.  The PET scan showed improvement in the left upper lung nodule.  And now measures 1.2 cm.  The SUV is down to 7.9.  Hopefully, there will be just radiation issues that are in this area.  She is still smoking.  She probably smokes about a third of a pack a day of cigarettes.  She has had no hemoptysis.  She has little bit of a cough.  It is always productive of mucus.  Her appetite is okay.  She has had no nausea or vomiting.  There is no issues with bowels or bladder.  She has had no leg swelling.  Overall, her performance status is ECOG 2.    Medications:  Current Outpatient Medications:  .  albuterol (PROVENTIL HFA;VENTOLIN HFA) 108 (90 Base) MCG/ACT inhaler, Inhale 2 puffs into the lungs every 6 (six) hours as needed for wheezing or shortness of breath., Disp: 1 Inhaler, Rfl: 5 .  albuterol (PROVENTIL) (2.5 MG/3ML) 0.083% nebulizer solution, Take 3 mLs (2.5 mg total) by nebulization every 4 (four) hours as needed for wheezing or shortness of breath., Disp: 150 mL, Rfl: 5 .  ALPRAZolam (XANAX) 0.25 MG tablet, Take 1 tablet (0.25 mg total) by mouth at bedtime as needed for anxiety., Disp: 90 tablet, Rfl: 1 .  bisacodyl (DULCOLAX) 5 MG EC tablet, Take 2 tablets (10 mg total) by mouth at bedtime. (Patient taking differently: Take 10 mg by mouth daily as needed for moderate  constipation. ), Disp: 30 tablet, Rfl:  .  cefUROXime (CEFTIN) 250 MG tablet, Take 1 tablet (250 mg total) by mouth 2 (two) times daily with a meal., Disp: 60 tablet, Rfl: 0 .  clopidogrel (PLAVIX) 75 MG tablet, TAKE 1 TABLET (75 MG TOTAL) BY MOUTH DAILY WITH BREAKFAST. (Patient taking differently: Take 75 mg by mouth daily. ), Disp: 90 tablet, Rfl: 3 .  diltiazem (CARDIZEM CD) 120 MG 24 hr capsule, TAKE 1 CAPSULE BY MOUTH AT BEDTIME. (Patient taking differently: Take 120 mg by mouth daily. ), Disp: 90 capsule, Rfl: 2 .  diphenhydrAMINE HCl, Sleep, (ZZZQUIL) 25 MG CAPS, Take 50 mg by mouth at bedtime as needed (sleep)., Disp: , Rfl:  .  furosemide (LASIX) 20 MG tablet, Take 1 tablet by mouth once daily (Patient taking differently: Take 20 mg by mouth daily. ), Disp: 90 tablet, Rfl: 2 .  guaiFENesin (MUCINEX) 600 MG 12 hr tablet, Take 1 tablet (600 mg total) by mouth 2 (two) times daily as needed for cough or to loosen phlegm., Disp: 14 tablet, Rfl: 0 .  HYDROcodone-acetaminophen (NORCO) 10-325 MG tablet, Take 1 tablet by mouth every 6 (six) hours as needed for severe pain. Please fill on or after 10/16/19, Disp: 120 tablet, Rfl: 0 .  hydroxypropyl methylcellulose / hypromellose (ISOPTO TEARS / GONIOVISC) 2.5 % ophthalmic  solution, Place 1 drop into both eyes 3 (three) times daily as needed for dry eyes., Disp: , Rfl:  .  isosorbide mononitrate (IMDUR) 30 MG 24 hr tablet, Take 1 tablet by mouth once daily, Disp: 90 tablet, Rfl: 0 .  ketoconazole (NIZORAL) 2 % cream, APPLY TO AFFECTED AREA EVERY DAY, Disp: 45 g, Rfl: 1 .  levothyroxine (SYNTHROID) 150 MCG tablet, Take 1 tablet (150 mcg total) by mouth daily., Disp: 90 tablet, Rfl: 3 .  nitroGLYCERIN (NITROSTAT) 0.4 MG SL tablet, DISSOLVE ONE TABLET UNDER THE TONGUE EVERY 5 MINUTES AS NEEDED FOR CHEST PAIN.  DO NOT EXCEED A TOTAL OF 3 DOSES IN 15 MINUTES, Disp: 25 tablet, Rfl: 0 .  OXYGEN, Inhale 2.5 L/min into the lungs at bedtime., Disp: , Rfl:  .   pantoprazole (PROTONIX) 40 MG tablet, TAKE 1 TABLET BY MOUTH DAILY BEFORE BREAKFAST (Patient taking differently: Take 40 mg by mouth daily. ), Disp: 90 tablet, Rfl: 3 .  PARoxetine (PAXIL) 40 MG tablet, Take 1 tablet (40 mg total) by mouth daily., Disp: 90 tablet, Rfl: 3 .  phenytoin (DILANTIN) 100 MG ER capsule, TAKE 100 MG EVERY MORNING AND 200 MG EVERY NIGHT. (Patient taking differently: Take 100-200 mg by mouth See admin instructions. TAKE 100 MG EVERY MORNING AND 200 MG EVERY NIGHT.), Disp: 270 capsule, Rfl: 3 .  promethazine (PHENERGAN) 25 MG tablet, Take 1 tablet (25 mg total) by mouth every 8 (eight) hours as needed for nausea or vomiting., Disp: 90 tablet, Rfl: 1 .  Respiratory Therapy Supplies (FLUTTER) DEVI, As directed, Disp: 1 each, Rfl: 0 .  senna-docusate (SENOKOT S) 8.6-50 MG tablet, Take 2 tablets by mouth at bedtime., Disp: 60 tablet, Rfl: 11 .  SUMAtriptan (IMITREX) 100 MG tablet, May repeat in 2 hours if headache persists or recurs., Disp: 9 tablet, Rfl: 3 .  traZODone (DESYREL) 100 MG tablet, TAKE 1 TABLET BY MOUTH EVERYDAY AT BEDTIME (Patient taking differently: Take 100 mg by mouth at bedtime as needed for sleep. ), Disp: 90 tablet, Rfl: 3 .  Vitamin D, Ergocalciferol, (DRISDOL) 1.25 MG (50000 UT) CAPS capsule, TAKE ONE CAPSULE BY MOUTH ONE TIME PER WEEK (Patient taking differently: Take 50,000 Units by mouth every Monday. ), Disp: 12 capsule, Rfl: 3  Allergies:  Allergies  Allergen Reactions  . Nitrofuran Derivatives Other (See Comments)    confusion  . Oxycodone Hcl Other (See Comments)    Knocked her out for 6 days  . Morphine Other (See Comments)    "Makes me go crazy."   . Penicillins Hives    Has patient had a PCN reaction causing immediate rash, facial/tongue/throat swelling, SOB or lightheadedness with hypotension: unknown Has patient had a PCN reaction causing severe rash involving mucus membranes or skin necrosis: unknown Has patient had a PCN reaction that  required hospitalization No Has patient had a PCN reaction occurring within the last 10 years: No If all of the above answers are "NO", then may proceed with Cephalosporin use.  Other reaction(s): Rash-Generalized  . Tape Other (See Comments)    Skin turns red and burns    Past Medical History, Surgical history, Social history, and Family History were reviewed and updated.  Review of Systems: Review of Systems  Constitutional: Positive for fatigue.  HENT:  Negative.   Eyes: Negative.   Respiratory: Positive for cough.   Cardiovascular: Positive for chest pain.  Gastrointestinal: Negative.   Endocrine: Negative.   Genitourinary: Negative.    Musculoskeletal: Positive for  arthralgias and myalgias.  Skin: Negative.   Neurological: Negative.   Hematological: Negative.   Psychiatric/Behavioral: Negative.     Physical Exam:  weight is 167 lb (75.8 kg). Her oral temperature is 98.6 F (37 C). Her blood pressure is 135/39 (abnormal) and her pulse is 85. Her respiration is 18 and oxygen saturation is 98%.   Wt Readings from Last 3 Encounters:  01/23/20 167 lb (75.8 kg)  01/01/20 170 lb (77.1 kg)  12/08/19 169 lb 9 oz (76.9 kg)    Physical Exam Vitals reviewed.  HENT:     Head: Normocephalic and atraumatic.  Eyes:     Pupils: Pupils are equal, round, and reactive to light.  Cardiovascular:     Rate and Rhythm: Normal rate and regular rhythm.     Heart sounds: Normal heart sounds.     Comments: Cardiac exam shows a regular rate and rhythm.  She has no murmurs, rubs or bruits. Pulmonary:     Effort: Pulmonary effort is normal.     Breath sounds: Normal breath sounds.     Comments: Pulmonary exam shows scattered rhonchi bilaterally.  She has some wheezes.  She has a decent air movement. Abdominal:     General: Bowel sounds are normal.     Palpations: Abdomen is soft.  Musculoskeletal:        General: No tenderness or deformity. Normal range of motion.     Cervical back:  Normal range of motion.  Lymphadenopathy:     Cervical: No cervical adenopathy.  Skin:    General: Skin is warm and dry.     Findings: No erythema or rash.     Comments: Skin exam shows scattered ecchymoses.  Neurological:     Mental Status: She is alert and oriented to person, place, and time.  Psychiatric:        Behavior: Behavior normal.        Thought Content: Thought content normal.        Judgment: Judgment normal.      Lab Results  Component Value Date   WBC 6.8 01/23/2020   HGB 11.8 (L) 01/23/2020   HCT 37.6 01/23/2020   MCV 98.4 01/23/2020   PLT 276 01/23/2020     Chemistry      Component Value Date/Time   NA 138 01/23/2020 1142   NA 144 07/08/2018 1059   K 4.7 01/23/2020 1142   CL 101 01/23/2020 1142   CO2 30 01/23/2020 1142   BUN 14 01/23/2020 1142   BUN 12 07/08/2018 1059   CREATININE 0.99 01/23/2020 1142      Component Value Date/Time   CALCIUM 9.4 01/23/2020 1142   ALKPHOS 113 01/23/2020 1142   AST 10 (L) 01/23/2020 1142   ALT 7 01/23/2020 1142   BILITOT 0.2 (L) 01/23/2020 1142       Impression and Plan: Kaylee Hansen is a 64 year old white female.  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  I feel that she is responding to the SBRT.  Is only been 2 months that she had treatment.  I think we should probably still see a response.  I probably would repeat the PET scan in about 2 months.  I will send her in a Medrol Dosepak to try to help with her lungs.  They just sound very congested.  I just wish that she was stop smoking.  I told her that if  she continues to smoke, this cancer will clearly activate again and then we will have problems.      [name], MD 7/30/20211:01 PM

## 2020-01-26 LAB — IRON AND TIBC
Iron: 43 ug/dL (ref 41–142)
Saturation Ratios: 14 % — ABNORMAL LOW (ref 21–57)
TIBC: 304 ug/dL (ref 236–444)
UIBC: 260 ug/dL (ref 120–384)

## 2020-01-26 LAB — FERRITIN: Ferritin: 25 ng/mL (ref 11–307)

## 2020-01-28 ENCOUNTER — Ambulatory Visit (INDEPENDENT_AMBULATORY_CARE_PROVIDER_SITE_OTHER): Payer: Medicare HMO | Admitting: Internal Medicine

## 2020-01-28 ENCOUNTER — Ambulatory Visit (INDEPENDENT_AMBULATORY_CARE_PROVIDER_SITE_OTHER): Payer: Medicare HMO

## 2020-01-28 ENCOUNTER — Other Ambulatory Visit: Payer: Self-pay

## 2020-01-28 ENCOUNTER — Encounter: Payer: Self-pay | Admitting: Internal Medicine

## 2020-01-28 VITALS — BP 140/50 | HR 88 | Temp 98.9°F | Ht 65.0 in | Wt 166.0 lb

## 2020-01-28 DIAGNOSIS — Z0000 Encounter for general adult medical examination without abnormal findings: Secondary | ICD-10-CM

## 2020-01-28 DIAGNOSIS — F411 Generalized anxiety disorder: Secondary | ICD-10-CM

## 2020-01-28 DIAGNOSIS — R69 Illness, unspecified: Secondary | ICD-10-CM | POA: Diagnosis not present

## 2020-01-28 DIAGNOSIS — G8929 Other chronic pain: Secondary | ICD-10-CM

## 2020-01-28 DIAGNOSIS — M5440 Lumbago with sciatica, unspecified side: Secondary | ICD-10-CM | POA: Diagnosis not present

## 2020-01-28 DIAGNOSIS — C3490 Malignant neoplasm of unspecified part of unspecified bronchus or lung: Secondary | ICD-10-CM

## 2020-01-28 DIAGNOSIS — E559 Vitamin D deficiency, unspecified: Secondary | ICD-10-CM

## 2020-01-28 DIAGNOSIS — L821 Other seborrheic keratosis: Secondary | ICD-10-CM | POA: Diagnosis not present

## 2020-01-28 MED ORDER — KETOCONAZOLE 2 % EX CREA: TOPICAL_CREAM | CUTANEOUS | 1 refills | Status: DC

## 2020-01-28 MED ORDER — HYDROCODONE-ACETAMINOPHEN 10-325 MG PO TABS: 1.0000 | ORAL_TABLET | Freq: Four times a day (QID) | ORAL | 0 refills | Status: DC | PRN

## 2020-01-28 MED ORDER — CLOBETASOL PROPIONATE 0.05 % EX SOLN
1.0000 | Freq: Two times a day (BID) | CUTANEOUS | 3 refills | Status: DC
Start: 2020-01-28 — End: 2021-02-10

## 2020-01-28 NOTE — Assessment & Plan Note (Signed)
F/u w/Dr Ennever 

## 2020-01-28 NOTE — Assessment & Plan Note (Signed)
Vit D 

## 2020-01-28 NOTE — Patient Instructions (Signed)
Kaylee Hansen , Thank you for taking time to come for your Medicare Wellness Visit. I appreciate your ongoing commitment to your health goals. Please review the following plan we discussed and let me know if I can assist you in the future.   Screening recommendations/referrals: Colonoscopy: 10/28/2009 Mammogram: 01/07/2019 Bone Density: never done Recommended yearly ophthalmology/optometry visit for glaucoma screening and checkup Recommended yearly dental visit for hygiene and checkup  Vaccinations: Influenza vaccine: 04/03/2019 Pneumococcal vaccine: completed Tdap vaccine: 04/13/2016 Shingles vaccine: never done   Covid-19: completed  Advanced directives: Advance directive discussed with you today. Even though you declined this today please call our office should you change your mind and we can give you the proper paperwork for you to fill out.   Conditions/risks identified: Yes; Please continue to do your personal lifestyle choices by: daily care of teeth and gums, regular physical activity (goal should be 5 days a week for 30 minutes), eat a healthy diet, avoid tobacco and drug use, limiting any alcohol intake, taking a low-dose aspirin (if not allergic or have been advised by your provider otherwise) and taking vitamins and minerals as recommended by your provider. Continue doing brain stimulating activities (puzzles, reading, adult coloring books, staying active) to keep memory sharp. Continue to eat heart healthy diet (full of fruits, vegetables, whole grains, lean protein, water--limit salt, fat, and sugar intake) and increase physical activity as tolerated.  Next appointment: Please schedule your next Medicare Wellness Visit with your Nurse Health Advisor in 1 year.   Preventive Care 66 Years and Older, Female Preventive care refers to lifestyle choices and visits with your health care provider that can promote health and wellness. What does preventive care include?  A yearly physical  exam. This is also called an annual well check.  Dental exams once or twice a year.  Routine eye exams. Ask your health care provider how often you should have your eyes checked.  Personal lifestyle choices, including:  Daily care of your teeth and gums.  Regular physical activity.  Eating a healthy diet.  Avoiding tobacco and drug use.  Limiting alcohol use.  Practicing safe sex.  Taking low-dose aspirin every day.  Taking vitamin and mineral supplements as recommended by your health care provider. What happens during an annual well check? The services and screenings done by your health care provider during your annual well check will depend on your age, overall health, lifestyle risk factors, and family history of disease. Counseling  Your health care provider may ask you questions about your:  Alcohol use.  Tobacco use.  Drug use.  Emotional well-being.  Home and relationship well-being.  Sexual activity.  Eating habits.  History of falls.  Memory and ability to understand (cognition).  Work and work Statistician.  Reproductive health. Screening  You may have the following tests or measurements:  Height, weight, and BMI.  Blood pressure.  Lipid and cholesterol levels. These may be checked every 5 years, or more frequently if you are over 44 years old.  Skin check.  Lung cancer screening. You may have this screening every year starting at age 74 if you have a 30-pack-year history of smoking and currently smoke or have quit within the past 15 years.  Fecal occult blood test (FOBT) of the stool. You may have this test every year starting at age 81.  Flexible sigmoidoscopy or colonoscopy. You may have a sigmoidoscopy every 5 years or a colonoscopy every 10 years starting at age 65.  Hepatitis C  blood test.  Hepatitis B blood test.  Sexually transmitted disease (STD) testing.  Diabetes screening. This is done by checking your blood sugar (glucose)  after you have not eaten for a while (fasting). You may have this done every 1-3 years.  Bone density scan. This is done to screen for osteoporosis. You may have this done starting at age 17.  Mammogram. This may be done every 1-2 years. Talk to your health care provider about how often you should have regular mammograms. Talk with your health care provider about your test results, treatment options, and if necessary, the need for more tests. Vaccines  Your health care provider may recommend certain vaccines, such as:  Influenza vaccine. This is recommended every year.  Tetanus, diphtheria, and acellular pertussis (Tdap, Td) vaccine. You may need a Td booster every 10 years.  Zoster vaccine. You may need this after age 1.  Pneumococcal 13-valent conjugate (PCV13) vaccine. One dose is recommended after age 25.  Pneumococcal polysaccharide (PPSV23) vaccine. One dose is recommended after age 50. Talk to your health care provider about which screenings and vaccines you need and how often you need them. This information is not intended to replace advice given to you by your health care provider. Make sure you discuss any questions you have with your health care provider. Document Released: 07/09/2015 Document Revised: 03/01/2016 Document Reviewed: 04/13/2015 Elsevier Interactive Patient Education  2017 [place] Prevention in the Home Falls can cause injuries. They can happen to people of all ages. There are many things you can do to make your home safe and to help prevent falls. What can I do on the outside of my home?  Regularly fix the edges of walkways and driveways and fix any cracks.  Remove anything that might make you trip as you walk through a door, such as a raised step or threshold.  Trim any bushes or trees on the path to your home.  Use bright outdoor lighting.  Clear any walking paths of anything that might make someone trip, such as rocks or  tools.  Regularly check to see if handrails are loose or broken. Make sure that both sides of any steps have handrails.  Any raised decks and porches should have guardrails on the edges.  Have any leaves, snow, or ice cleared regularly.  Use sand or salt on walking paths during winter.  Clean up any spills in your garage right away. This includes oil or grease spills. What can I do in the bathroom?  Use night lights.  Install grab bars by the toilet and in the tub and shower. Do not use towel bars as grab bars.  Use non-skid mats or decals in the tub or shower.  If you need to sit down in the shower, use a plastic, non-slip stool.  Keep the floor dry. Clean up any water that spills on the floor as soon as it happens.  Remove soap buildup in the tub or shower regularly.  Attach bath mats securely with double-sided non-slip rug tape.  Do not have throw rugs and other things on the floor that can make you trip. What can I do in the bedroom?  Use night lights.  Make sure that you have a light by your bed that is easy to reach.  Do not use any sheets or blankets that are too big for your bed. They should not hang down onto the floor.  Have a firm chair that has side arms. You  can use this for support while you get dressed.  Do not have throw rugs and other things on the floor that can make you trip. What can I do in the kitchen?  Clean up any spills right away.  Avoid walking on wet floors.  Keep items that you use a lot in easy-to-reach places.  If you need to reach something above you, use a strong step stool that has a grab bar.  Keep electrical cords out of the way.  Do not use floor polish or wax that makes floors slippery. If you must use wax, use non-skid floor wax.  Do not have throw rugs and other things on the floor that can make you trip. What can I do with my stairs?  Do not leave any items on the stairs.  Make sure that there are handrails on both  sides of the stairs and use them. Fix handrails that are broken or loose. Make sure that handrails are as long as the stairways.  Check any carpeting to make sure that it is firmly attached to the stairs. Fix any carpet that is loose or worn.  Avoid having throw rugs at the top or bottom of the stairs. If you do have throw rugs, attach them to the floor with carpet tape.  Make sure that you have a light switch at the top of the stairs and the bottom of the stairs. If you do not have them, ask someone to add them for you. What else can I do to help prevent falls?  Wear shoes that:  Do not have high heels.  Have rubber bottoms.  Are comfortable and fit you well.  Are closed at the toe. Do not wear sandals.  If you use a stepladder:  Make sure that it is fully opened. Do not climb a closed stepladder.  Make sure that both sides of the stepladder are locked into place.  Ask someone to hold it for you, if possible.  Clearly mark and make sure that you can see:  Any grab bars or handrails.  First and last steps.  Where the edge of each step is.  Use tools that help you move around (mobility aids) if they are needed. These include:  Canes.  Walkers.  Scooters.  Crutches.  Turn on the lights when you go into a dark area. Replace any light bulbs as soon as they burn out.  Set up your furniture so you have a clear path. Avoid moving your furniture around.  If any of your floors are uneven, fix them.  If there are any pets around you, be aware of where they are.  Review your medicines with your doctor. Some medicines can make you feel dizzy. This can increase your chance of falling. Ask your doctor what other things that you can do to help prevent falls. This information is not intended to replace advice given to you by your health care provider. Make sure you discuss any questions you have with your health care provider. Document Released: 04/08/2009 Document Revised:  11/18/2015 Document Reviewed: 07/17/2014 Elsevier Interactive Patient Education  2017 [employer].

## 2020-01-28 NOTE — Progress Notes (Addendum)
Subjective:   Kaylee Hansen is a 64 y.o. female who presents for Medicare Annual (Subsequent) preventive examination.  Review of Systems    No ROS. Medicare Wellness Visit. Additional risk factors are reflected in social history. Cardiac Risk Factors include: advanced age (>5men, >54 women);dyslipidemia;family history of premature cardiovascular disease;hypertension;smoking/ tobacco exposure     Objective:    Today's Vitals   01/28/20 1101 01/28/20 1132  BP: (!) 140/50   Pulse: 88   Temp: 98.9 F (37.2 C)   TempSrc: Oral   SpO2: 93%   Weight: 166 lb (75.3 kg)   Height: 5\' 5"  (1.651 m)   PainSc:  0-No pain   Body mass index is 27.62 kg/m.  Advanced Directives 01/28/2020 01/23/2020 01/01/2020 11/07/2019 10/29/2019 10/08/2019 10/01/2019  Does Patient Have a Medical Advance Directive? No No No No No No No  Would patient like information on creating a medical advance directive? No - Patient declined No - Patient declined No - Patient declined No - Patient declined No - Patient declined No - Patient declined No - Patient declined  Pre-existing out of facility DNR order (yellow form or pink MOST form) - - - - - - -    Current Medications (verified) Outpatient Encounter Medications as of 01/28/2020  Medication Sig   albuterol (PROVENTIL) (2.5 MG/3ML) 0.083% nebulizer solution Take 3 mLs (2.5 mg total) by nebulization every 4 (four) hours as needed for wheezing or shortness of breath.   ALPRAZolam (XANAX) 0.25 MG tablet Take 1 tablet (0.25 mg total) by mouth at bedtime as needed for anxiety.   bisacodyl (DULCOLAX) 5 MG EC tablet Take 2 tablets (10 mg total) by mouth at bedtime. (Patient taking differently: Take 10 mg by mouth daily as needed for moderate constipation. )   cefUROXime (CEFTIN) 250 MG tablet Take 1 tablet (250 mg total) by mouth 2 (two) times daily with a meal.   clopidogrel (PLAVIX) 75 MG tablet TAKE 1 TABLET (75 MG TOTAL) BY MOUTH DAILY WITH BREAKFAST. (Patient taking  differently: Take 75 mg by mouth daily. )   diltiazem (CARDIZEM CD) 120 MG 24 hr capsule TAKE 1 CAPSULE BY MOUTH AT BEDTIME. (Patient taking differently: Take 120 mg by mouth daily. )   diphenhydrAMINE HCl, Sleep, (ZZZQUIL) 25 MG CAPS Take 50 mg by mouth at bedtime as needed (sleep).   furosemide (LASIX) 20 MG tablet Take 1 tablet by mouth once daily (Patient taking differently: Take 20 mg by mouth daily. )   guaiFENesin (MUCINEX) 600 MG 12 hr tablet Take 1 tablet (600 mg total) by mouth 2 (two) times daily as needed for cough or to loosen phlegm.   HYDROcodone-acetaminophen (NORCO) 10-325 MG tablet Take 1 tablet by mouth every 6 (six) hours as needed for severe pain. Please fill on or after 02/18/20   hydroxypropyl methylcellulose / hypromellose (ISOPTO TEARS / GONIOVISC) 2.5 % ophthalmic solution Place 1 drop into both eyes 3 (three) times daily as needed for dry eyes.   isosorbide mononitrate (IMDUR) 30 MG 24 hr tablet Take 1 tablet by mouth once daily   ketoconazole (NIZORAL) 2 % cream APPLY TO AFFECTED AREA EVERY DAY   levothyroxine (SYNTHROID) 150 MCG tablet Take 1 tablet (150 mcg total) by mouth daily.   methylPREDNISolone (MEDROL DOSEPAK) 4 MG TBPK tablet Take 6-5-4-3-2-1.  Start today   nitroGLYCERIN (NITROSTAT) 0.4 MG SL tablet DISSOLVE ONE TABLET UNDER THE TONGUE EVERY 5 MINUTES AS NEEDED FOR CHEST PAIN.  DO NOT EXCEED A TOTAL  OF 3 DOSES IN 15 MINUTES   OXYGEN Inhale 2.5 L/min into the lungs at bedtime.   pantoprazole (PROTONIX) 40 MG tablet TAKE 1 TABLET BY MOUTH DAILY BEFORE BREAKFAST (Patient taking differently: Take 40 mg by mouth daily. )   PARoxetine (PAXIL) 40 MG tablet Take 1 tablet (40 mg total) by mouth daily.   phenytoin (DILANTIN) 100 MG ER capsule TAKE 100 MG EVERY MORNING AND 200 MG EVERY NIGHT. (Patient taking differently: Take 100-200 mg by mouth See admin instructions. TAKE 100 MG EVERY MORNING AND 200 MG EVERY NIGHT.)   promethazine (PHENERGAN) 25 MG tablet Take 1 tablet  (25 mg total) by mouth every 8 (eight) hours as needed for nausea or vomiting.   Respiratory Therapy Supplies (FLUTTER) DEVI As directed   senna-docusate (SENOKOT S) 8.6-50 MG tablet Take 2 tablets by mouth at bedtime.   SUMAtriptan (IMITREX) 100 MG tablet May repeat in 2 hours if headache persists or recurs.   traZODone (DESYREL) 100 MG tablet TAKE 1 TABLET BY MOUTH EVERYDAY AT BEDTIME (Patient taking differently: Take 100 mg by mouth at bedtime as needed for sleep. )   Vitamin D, Ergocalciferol, (DRISDOL) 1.25 MG (50000 UT) CAPS capsule TAKE ONE CAPSULE BY MOUTH ONE TIME PER WEEK (Patient taking differently: Take 50,000 Units by mouth every Monday. )   [DISCONTINUED] HYDROcodone-acetaminophen (NORCO) 10-325 MG tablet Take 1 tablet by mouth every 6 (six) hours as needed for severe pain. Please fill on or after 10/16/19   [DISCONTINUED] ketoconazole (NIZORAL) 2 % cream APPLY TO AFFECTED AREA EVERY DAY   albuterol (PROVENTIL HFA;VENTOLIN HFA) 108 (90 Base) MCG/ACT inhaler Inhale 2 puffs into the lungs every 6 (six) hours as needed for wheezing or shortness of breath.   clobetasol (TEMOVATE) 0.05 % external solution Apply 1 application topically 2 (two) times daily. For itchy scalp lesions   HYDROcodone-acetaminophen (NORCO) 10-325 MG tablet Take 1 tablet by mouth every 6 (six) hours as needed for up to 5 days for severe pain.   HYDROcodone-acetaminophen (NORCO) 10-325 MG tablet Take 1 tablet by mouth every 6 (six) hours as needed for up to 5 days for severe pain.   [DISCONTINUED] calcitRIOL (ROCALTROL) 0.25 MCG capsule Take 1 capsule (0.25 mcg total) by mouth daily.   No facility-administered encounter medications on file as of 01/28/2020.    Allergies (verified) Nitrofuran derivatives, Oxycodone hcl, Morphine, Penicillins, and Tape   History: Past Medical History:  Diagnosis Date   Acute respiratory failure following trauma and surgery ([place]) 08/26/2012   Anxiety    Aortic insufficiency with  aortic stenosis 04/2017   TTE December 2019: Mild AS with severe regurgitation.  Mild LA dilation.;;  TEE January 2016: Severe-type III aortic regurgitation (holodiastolic flow reversal in the a sending aorta & vena contracta =6.    CAD S/P percutaneous coronary angioplasty 11/2017   Proximal RCA PCI Synergy DES 3.5 mm x 16 mm (3.8 mm). Ost-mLM 30%. Ost-prox Cx 40%.   Collagenous colitis    Colon adenomas 2011   Constipation    Chronic abdominal pain and constipation   COPD (chronic obstructive pulmonary disease) (HCC)    Depression    Diverticulosis    Esophageal stricture 11/08/2012   Ulcer noted in 2010   GERD (gastroesophageal reflux disease)    Helicobacter pylori gastritis 2010   Pylera Tx   History of cholecystectomy    Hx of appendectomy    Hx of cancer of lung 1999   Hx of hysterectomy    Hyperlipidemia  Hypothyroidism    Iron deficiency anemia due to chronic blood loss 10/01/2019   Low back pain    Non-STEMI (non-ST elevated myocardial infarction) ([place]) 11/2017   RCA PCI   Osteoarthritis of knee    bilateral knee   Seizures (HCC)    Stroke (HCC)    CVA, hx of 97   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[place])    Past Surgical History:  Procedure Laterality Date   ABDOMINAL HYSTERECTOMY     complete 1992   ABDOMINAL SURGERY     Exploratory   APPENDECTOMY     BALLOON DILATION N/A 11/08/2012   Procedure: BALLOON DILATION;  Surgeon: Gatha Mayer, MD;  Location: [facility];  Service: Endoscopy;  Laterality: N/A;   CHOLECYSTECTOMY     COLONOSCOPY W/ BIOPSIES     multiple   CORONARY STENT INTERVENTION N/A 06/28/2018   Procedure: CORONARY STENT INTERVENTION;  Surgeon: Martinique, Peter M, MD;  Location: [place] [facility];  Service: Cardiovascular;  Laterality: N/A;  90% prox RCA -> PCI with Synergy DES 3.5 mm x 16 mm (3.8 mm).   CT CTA CORONARY W/CA SCORE W/CM &/OR WO/CM  05/2017   Coronary calcium score 2.9. Intermediate risk. Unusual noncalcified plaque in RCA --FFR negative    ESOPHAGOGASTRODUODENOSCOPY     w/baloon x 2   ESOPHAGOGASTRODUODENOSCOPY N/A 11/08/2012   Procedure: ESOPHAGOGASTRODUODENOSCOPY (EGD);  Surgeon: Gatha Mayer, MD;  Location: [facility];  Service: Endoscopy;  Laterality: N/A;   ESOPHAGOGASTRODUODENOSCOPY (EGD) WITH PROPOFOL N/A 08/25/2019   Procedure: ESOPHAGOGASTRODUODENOSCOPY (EGD) WITH PROPOFOL;  Surgeon: Gatha Mayer, MD;  Location: [facility];  Service: Endoscopy;  Laterality: N/A;   LEFT HEART CATH AND CORONARY ANGIOGRAPHY N/A 06/28/2018   Procedure: LEFT HEART CATH AND CORONARY ANGIOGRAPHY;  Surgeon: Martinique, Peter M, MD;  Location: [facility];  Service: Cardiovascular:  90% prox RCA -> DES PCI. Ost-mLM 30%. Ost-prox Cx 40%.   EF 55-65%. LVEDP 15 mmHg.    MALONEY DILATION  08/25/2019   Procedure: MALONEY DILATION;  Surgeon: Gatha Mayer, MD;  Location: [facility];  Service: Endoscopy;;   RIGHT HEART CATH N/A 07/11/2018   Procedure: RIGHT HEART CATH;  Surgeon: Leonie Man, MD;  Location: [facility];;   Relatively normal Right Heart Cath pressures: PA pressure 26/14 mmHg-mean 20 mmHg; PCWP 13 mmHg.  RAP 6 mmHg, RVP 28/3 mmHg-EDP 10 mmHg. Severely reduced cardiac output and index by Fick: 3.63 and 2.07.   TEE WITHOUT CARDIOVERSION N/A 07/11/2018   Procedure: TRANSESOPHAGEAL ECHOCARDIOGRAM (TEE);  Surgeon: [name], MD;  Location: [facility];  Service: Cardiology;;  Severe aortic regurgitation-type III. (suggested by holodiastolic flow reversal and descending aorta-vena contracta 6 mm).   TOTAL HIP ARTHROPLASTY     Left   TRANSTHORACIC ECHOCARDIOGRAM  05/2018   EF 60-65%.  No R WMA.  GR 1 DD.  Mild aortic stenosis with severe regurgitation.  Mild MR..  Only mild LV dilation noted.   TRANSTHORACIC ECHOCARDIOGRAM  12/2018    EF 60-65%. Mild LVH. Gr1 DD. Mod AoV thickening. Mod-Severe AI, ~ mlid AS.   Family History  Problem Relation Age of Onset   Coronary artery disease Mother    Diabetes Mother     Hypertension Father    Diabetes Son    Coronary artery disease Other        grandmother, grandfather   Kidney disease Other        aunt   Kidney cancer Sister    Colon  cancer Neg Hx        colon   Social History   Socioeconomic History   Marital status: Married    Spouse name: Not on file   Number of children: 2   Years of education: Not on file   Highest education level: Not on file  Occupational History   Occupation: disabled    Employer: DISABLED  Tobacco Use   Smoking status: Current Every Day Smoker    Packs/day: 0.50    Years: 48.00    Pack years: 24.00    Types: Cigarettes   Smokeless tobacco: Never Used   Tobacco comment: 10/01/19 - 7 cigs/day  Vaping Use   Vaping Use: Never used  Substance and Sexual Activity   Alcohol use: No   Drug use: No   Sexual activity: Not Currently  Other Topics Concern   Not on file  Social History Narrative   Married   2 children   No regular exercise         Social Determinants of Health   Financial Resource Strain: Low Risk    Difficulty of Paying Living Expenses: Not hard at all  Food Insecurity: No Food Insecurity   Worried About Charity fundraiser in the Last Year: Never true   Ran Out of Food in the Last Year: Never true  Transportation Needs: No Transportation Needs   Lack of Transportation (Medical): No   Lack of Transportation (Non-Medical): No  Physical Activity: Inactive   Days of Exercise per Week: 0 days   Minutes of Exercise per Session: 0 min  Stress: No Stress Concern Present   Feeling of Stress : Not at all  Social Connections:    Frequency of Communication with Friends and Family:    Frequency of Social Gatherings with Friends and Family:    Attends Religious Services:    Active Member of Clubs or Organizations:    Attends Archivist Meetings:    Marital Status:     Tobacco Counseling Ready to quit: Not Answered Counseling given: Not Answered Comment: 10/01/19 - 7  cigs/day   Clinical Intake:  Pre-visit preparation completed: Yes  Pain : No/denies pain Pain Score: 0-No pain     BMI - recorded: 27.62 Nutritional Status: BMI 25 -29 Overweight Nutritional Risks: None Diabetes: No  How often do you need to have someone help you when you read instructions, pamphlets, or other written materials from your doctor or pharmacy?: 1 - Never What is the last grade level you completed in school?: 11th grade  Diabetic? NO  Interpreter Needed?: No  Information entered by :: Lisette Abu, LPN   Activities of Daily Living In your present state of health, do you have any difficulty performing the following activities: 01/28/2020 10/08/2019  Hearing? N N  Vision? N N  Difficulty concentrating or making decisions? Y N  Walking or climbing stairs? Y Y  Comment - uses power chair  Dressing or bathing? Y Y  Comment - shortness of breath  Doing errands, shopping? Y Y  Comment - depends on husband  Conservation officer, nature and eating ? Y Y  Comment - depends on husband  Using the Toilet? Y N  In the past six months, have you accidently leaked urine? Y N  Do you have problems with loss of bowel control? - N  Managing your Medications? Y N  Managing your Finances? Y N  Housekeeping or managing your Housekeeping? Y Y  Comment - depends on husband  Some recent data might be hidden    Patient Care Team: Plotnikov, [name], MD as PCP - General Leonie Man, MD as PCP - Cardiology (Cardiology) Gatha Mayer, MD (Gastroenterology) Carolan Clines, MD (Inactive) as Attending Physician (Urology) Erline Levine, MD as Consulting Physician (Neurosurgery) Valente David, RN as [place] any recent Medical Services you may have received from other than [facility] providers in the past year (date may be approximate).     Assessment:   This is a routine wellness examination for [place].  Hearing/Vision screen No exam  data present  Dietary issues and exercise activities discussed: Current Exercise Habits: The patient does not participate in regular exercise at present, Exercise limited by: respiratory conditions(s);cardiac condition(s);orthopedic condition(s);psychological condition(s);neurologic condition(s)  Goals       I want my breathing to get better (pt-stated)      CARE PLAN ENTRY (see longtitudinal plan of care for additional care plan information)  Current Barriers:  Knowledge deficits related to basic understanding of COPD disease process Knowledge deficit related to basic understanding of how to use inhalers and how inhaled medications work   Case Manager Clinical Goal(s): Over the next 28 days patient will report using inhalers as prescribed including rinsing mouth after use Over the next 45 days, patient will be able to verbalize understanding of COPD action plan and when to seek appropriate levels of medical care Over the next 28 days, patient will engage in lite exercise as tolerated to build/regain stamina and strength and reduce shortness of breath through activity tolerance   Interventions:  Provided patient with COPD action plan and reinforced importance of daily self assessment Provided instruction about proper use of medications used for management of COPD including inhalers Advised patient to self assesses COPD action plan zone and make appointment with provider if in the yellow zone for 48 hours without improvement.  Patient Self Care Activities:  Takes medications as prescribed including inhalers Self assesses COPD action plan zone and makes appointment with provider if in the yellow zone for 48 hours without improvement. Utilizes infection prevention strategies to reduce risk of respiratory infection   Initial goal documentation        Depression Screen PHQ 2/9 Scores 01/28/2020 10/08/2019 04/04/2017 10/03/2016  PHQ - 2 Score 1 3 6  -  PHQ- 9 Score - 13 11 -  Exception  Documentation - (No Data) - Medical reason    Fall Risk Fall Risk  01/28/2020 10/08/2019 10/03/2016  Falls in the past year? 1 1 Yes  Number falls in past yr: 1 1 2  or more  Injury with Fall? 0 0 No  Risk for fall due to : Impaired balance/gait;Impaired mobility History of fall(s) -  Follow up - Falls prevention discussed -    Any stairs in or around the home? No  If so, are there any without handrails? No  Home free of loose throw rugs in walkways, pet beds, electrical cords, etc? Yes  Adequate lighting in your home to reduce risk of falls? Yes   ASSISTIVE DEVICES UTILIZED TO PREVENT FALLS:  Life alert? No  Use of a cane, walker or w/c? Yes  Grab bars in the bathroom? No  Shower chair or bench in shower? No  Elevated toilet seat or a handicapped toilet? No   TIMED UP AND GO:  Was the test performed? No .  Length of time to ambulate 10 feet: 0 sec.   APPEARANCE OF GAIT: Patient was in  a wheelchair  Cognitive Function:     6CIT Screen 01/28/2020  What Year? 0 points  What month? 0 points  What time? 3 points  Count back from 20 0 points  Months in reverse 2 points  Repeat phrase 2 points  Total Score 7    Immunizations Immunization History  Administered Date(s) Administered   Fluad Quad(high Dose 65+) 04/03/2019   Influenza Split 03/27/2012   Influenza Whole 05/08/2007, 04/08/2009, 03/24/2011   Influenza, High Dose Seasonal PF 04/04/2017   Influenza,inj,Quad PF,6+ Mos 03/05/2013, 05/01/2014, 03/05/2015, 04/17/2016, 03/13/2018   Pneumococcal Conjugate-13 07/04/2016   Pneumococcal Polysaccharide-23 07/14/2015   Td 04/13/2016   Tdap 07/14/2015    TDAP status: Up to date Flu Vaccine status: Up to date Pneumococcal vaccine status: Up to date Covid-19 vaccine status: Completed vaccines  Qualifies for Shingles Vaccine? Yes   Zostavax completed No   Shingrix Completed?: No.    Education has been provided regarding the importance of this vaccine. Patient has been  advised to call insurance company to determine out of pocket expense if they have not yet received this vaccine. Advised may also receive vaccine at local pharmacy or Health Dept. Verbalized acceptance and understanding.  Screening Tests Health Maintenance  Topic Date Due   Hepatitis C Screening  Never done   COVID-19 Vaccine (1) Never done   DEXA SCAN  Never done   COLONOSCOPY  10/29/2019   INFLUENZA VACCINE  01/25/2020   PNA vac Low Risk Adult (2 of 2 - PPSV23) 07/13/2020   MAMMOGRAM  01/06/2021   TETANUS/TDAP  04/13/2026    Health Maintenance  Health Maintenance Due  Topic Date Due   Hepatitis C Screening  Never done   COVID-19 Vaccine (1) Never done   DEXA SCAN  Never done   COLONOSCOPY  10/29/2019   INFLUENZA VACCINE  01/25/2020    Colorectal cancer screening: Completed 10/28/2009. Repeat every 10 years Mammogram status: Completed 01/07/2019. Repeat every year   Lung Cancer Screening: (Low Dose CT Chest recommended if Age 88-80 years, 30 pack-year currently smoking OR have quit w/in 15years.) does qualify.   Lung Cancer Screening Referral: goes to pulmonology; who has scheduled yearly CT Scans  Additional Screening:  Hepatitis C Screening: does qualify; Completed no  Vision Screening: Recommended annual ophthalmology exams for early detection of glaucoma and other disorders of the eye. Is the patient up to date with their annual eye exam?  Yes  Who is the provider or what is the name of the office in which the patient attends annual eye exams? [facility] If pt is not established with a provider, would they like to be referred to a provider to establish care? No .   Dental Screening: Recommended annual dental exams for proper oral hygiene  Community Resource Referral / Chronic Care Management: CRR required this visit?  No   CCM required this visit?  No      Plan:     I have personally reviewed and noted the following in the patient's chart:   Medical and  social history Use of alcohol, tobacco or illicit drugs  Current medications and supplements Functional ability and status Nutritional status Physical activity Advanced directives List of other physicians Hospitalizations, surgeries, and ER visits in previous 12 months Vitals Screenings to include cognitive, depression, and falls Referrals and appointments  In addition, I have reviewed and discussed with patient certain preventive protocols, quality metrics, and best practice recommendations. A written personalized care plan for preventive services as  well as general preventive health recommendations were provided to patient.     Sheral Flow, LPN   01/01/4783   Nurse Notes: n/a  Medical screening examination/treatment/procedure(s) were performed by non-physician practitioner and as supervising physician I was immediately available for consultation/collaboration.  I agree with above. Lew Dawes, MD

## 2020-01-28 NOTE — Assessment & Plan Note (Signed)
SKs on scalp - itchy lesions Clobetasol sol Derm ref offered

## 2020-01-28 NOTE — Assessment & Plan Note (Signed)
Norco  Potential benefits of a long term opioids use as well as potential risks (i.e. addiction risk, apnea etc) and complications (i.e. Somnolence, constipation and others) were explained to the patient and were aknowledged. 

## 2020-01-28 NOTE — Assessment & Plan Note (Signed)
Xanax prn  Potential benefits of a long term benzodiazepines  use as well as potential risks  and complications were explained to the patient and were aknowledged. 

## 2020-01-28 NOTE — Progress Notes (Signed)
Subjective:  Patient ID: Kaylee Hansen, female    DOB: March 06, 1956  Age: 64 y.o. MRN: [id number]  CC: No chief complaint on file.   HPI Kaylee Hansen presents for chronic pain, chronic anxiety, metastatic lung cancer relapse. Wellness exam  Outpatient Medications Prior to Visit  Medication Sig Dispense Refill  . albuterol (PROVENTIL) (2.5 MG/3ML) 0.083% nebulizer solution Take 3 mLs (2.5 mg total) by nebulization every 4 (four) hours as needed for wheezing or shortness of breath. 150 mL 5  . ALPRAZolam (XANAX) 0.25 MG tablet Take 1 tablet (0.25 mg total) by mouth at bedtime as needed for anxiety. 90 tablet 1  . bisacodyl (DULCOLAX) 5 MG EC tablet Take 2 tablets (10 mg total) by mouth at bedtime. (Patient taking differently: Take 10 mg by mouth daily as needed for moderate constipation. ) 30 tablet   . cefUROXime (CEFTIN) 250 MG tablet Take 1 tablet (250 mg total) by mouth 2 (two) times daily with a meal. 60 tablet 0  . clopidogrel (PLAVIX) 75 MG tablet TAKE 1 TABLET (75 MG TOTAL) BY MOUTH DAILY WITH BREAKFAST. (Patient taking differently: Take 75 mg by mouth daily. ) 90 tablet 3  . diltiazem (CARDIZEM CD) 120 MG 24 hr capsule TAKE 1 CAPSULE BY MOUTH AT BEDTIME. (Patient taking differently: Take 120 mg by mouth daily. ) 90 capsule 2  . diphenhydrAMINE HCl, Sleep, (ZZZQUIL) 25 MG CAPS Take 50 mg by mouth at bedtime as needed (sleep).    . furosemide (LASIX) 20 MG tablet Take 1 tablet by mouth once daily (Patient taking differently: Take 20 mg by mouth daily. ) 90 tablet 2  . guaiFENesin (MUCINEX) 600 MG 12 hr tablet Take 1 tablet (600 mg total) by mouth 2 (two) times daily as needed for cough or to loosen phlegm. 14 tablet 0  . hydroxypropyl methylcellulose / hypromellose (ISOPTO TEARS / GONIOVISC) 2.5 % ophthalmic solution Place 1 drop into both eyes 3 (three) times daily as needed for dry eyes.    . isosorbide mononitrate (IMDUR) 30 MG 24 hr tablet Take 1 tablet by mouth once daily 90 tablet  0  . ketoconazole (NIZORAL) 2 % cream APPLY TO AFFECTED AREA EVERY DAY 45 g 1  . levothyroxine (SYNTHROID) 150 MCG tablet Take 1 tablet (150 mcg total) by mouth daily. 90 tablet 3  . methylPREDNISolone (MEDROL DOSEPAK) 4 MG TBPK tablet Take 6-5-4-3-2-1.  Start today 21 tablet 3  . nitroGLYCERIN (NITROSTAT) 0.4 MG SL tablet DISSOLVE ONE TABLET UNDER THE TONGUE EVERY 5 MINUTES AS NEEDED FOR CHEST PAIN.  DO NOT EXCEED A TOTAL OF 3 DOSES IN 15 MINUTES 25 tablet 0  . OXYGEN Inhale 2.5 L/min into the lungs at bedtime.    . pantoprazole (PROTONIX) 40 MG tablet TAKE 1 TABLET BY MOUTH DAILY BEFORE BREAKFAST (Patient taking differently: Take 40 mg by mouth daily. ) 90 tablet 3  . PARoxetine (PAXIL) 40 MG tablet Take 1 tablet (40 mg total) by mouth daily. 90 tablet 3  . phenytoin (DILANTIN) 100 MG ER capsule TAKE 100 MG EVERY MORNING AND 200 MG EVERY NIGHT. (Patient taking differently: Take 100-200 mg by mouth See admin instructions. TAKE 100 MG EVERY MORNING AND 200 MG EVERY NIGHT.) 270 capsule 3  . promethazine (PHENERGAN) 25 MG tablet Take 1 tablet (25 mg total) by mouth every 8 (eight) hours as needed for nausea or vomiting. 90 tablet 1  . Respiratory Therapy Supplies (FLUTTER) DEVI As directed 1 each 0  . senna-docusate (  SENOKOT S) 8.6-50 MG tablet Take 2 tablets by mouth at bedtime. 60 tablet 11  . SUMAtriptan (IMITREX) 100 MG tablet May repeat in 2 hours if headache persists or recurs. 9 tablet 3  . traZODone (DESYREL) 100 MG tablet TAKE 1 TABLET BY MOUTH EVERYDAY AT BEDTIME (Patient taking differently: Take 100 mg by mouth at bedtime as needed for sleep. ) 90 tablet 3  . Vitamin D, Ergocalciferol, (DRISDOL) 1.25 MG (50000 UT) CAPS capsule TAKE ONE CAPSULE BY MOUTH ONE TIME PER WEEK (Patient taking differently: Take 50,000 Units by mouth every Monday. ) 12 capsule 3  . HYDROcodone-acetaminophen (NORCO) 10-325 MG tablet Take 1 tablet by mouth every 6 (six) hours as needed for severe pain. Please fill on or  after 10/16/19 120 tablet 0  . albuterol (PROVENTIL HFA;VENTOLIN HFA) 108 (90 Base) MCG/ACT inhaler Inhale 2 puffs into the lungs every 6 (six) hours as needed for wheezing or shortness of breath. 1 Inhaler 5   No facility-administered medications prior to visit.    ROS: Review of Systems  Constitutional: Positive for fatigue. Negative for activity change, appetite change, chills and unexpected weight change.  HENT: Negative for congestion, mouth sores and sinus pressure.   Eyes: Negative for visual disturbance.  Respiratory: Negative for cough and chest tightness.   Gastrointestinal: Negative for abdominal pain and nausea.  Genitourinary: Negative for difficulty urinating, frequency and vaginal pain.  Musculoskeletal: Positive for arthralgias, back pain and gait problem.  Skin: Positive for color change. Negative for pallor and rash.  Neurological: Positive for weakness. Negative for dizziness, tremors, numbness and headaches.  Psychiatric/Behavioral: Positive for decreased concentration and sleep disturbance. Negative for confusion and suicidal ideas. The patient is nervous/anxious.     Objective:  BP (!) 140/50 (BP Location: Right Arm, Patient Position: Sitting, Cuff Size: Normal)   Pulse 88   Temp 98.9 F (37.2 C) (Oral)   Ht 5\' 5"  (1.651 m)   Wt 166 lb (75.3 kg)   SpO2 93%   BMI 27.62 kg/m   BP Readings from Last 3 Encounters:  01/28/20 (!) 140/50  01/23/20 (!) 135/39  01/05/20 134/82    Wt Readings from Last 3 Encounters:  01/28/20 166 lb (75.3 kg)  01/23/20 167 lb (75.8 kg)  01/01/20 170 lb (77.1 kg)    Physical Exam Constitutional:      General: She is not in acute distress.    Appearance: She is well-developed.  HENT:     Head: Normocephalic.     Right Ear: External ear normal.     Left Ear: External ear normal.     Nose: Nose normal.  Eyes:     General:        Right eye: No discharge.        Left eye: No discharge.     Conjunctiva/sclera: Conjunctivae  normal.     Pupils: Pupils are equal, round, and reactive to light.  Neck:     Thyroid: No thyromegaly.     Vascular: No JVD.     Trachea: No tracheal deviation.  Cardiovascular:     Rate and Rhythm: Normal rate and regular rhythm.     Heart sounds: Normal heart sounds.  Pulmonary:     Effort: No respiratory distress.     Breath sounds: No stridor. No wheezing.  Abdominal:     General: Bowel sounds are normal. There is no distension.     Palpations: Abdomen is soft. There is no mass.     Tenderness:  There is no abdominal tenderness. There is no guarding or rebound.  Musculoskeletal:        General: Tenderness present.     Cervical back: Normal range of motion and neck supple.  Lymphadenopathy:     Cervical: No cervical adenopathy.  Skin:    Findings: No erythema or rash.  Neurological:     Cranial Nerves: No cranial nerve deficit.     Motor: No abnormal muscle tone.     Coordination: Coordination abnormal.     Deep Tendon Reflexes: Reflexes normal.  Psychiatric:        Behavior: Behavior normal.        Thought Content: Thought content normal.        Judgment: Judgment normal.   SK's on scalp  In a w/c  LS tender   Lab Results  Component Value Date   WBC 6.8 01/23/2020   HGB 11.8 (L) 01/23/2020   HCT 37.6 01/23/2020   PLT 276 01/23/2020   GLUCOSE 97 01/23/2020   CHOL 128 06/29/2018   TRIG 184 (H) 06/29/2018   HDL 35 (L) 06/29/2018   LDLDIRECT 187.0 07/14/2015   LDLCALC 56 06/29/2018   ALT 7 01/23/2020   AST 10 (L) 01/23/2020   NA 138 01/23/2020   K 4.7 01/23/2020   CL 101 01/23/2020   CREATININE 0.99 01/23/2020   BUN 14 01/23/2020   CO2 30 01/23/2020   TSH 1.84 01/01/2019   INR 0.9 09/22/2019   HGBA1C  09/17/2008    5.0 (NOTE)   The ADA recommends the following therapeutic goal for glycemic   control related to Hgb A1C measurement:   Goal of Therapy:   < 7.0% Hgb A1C   Reference: American Diabetes Association: Clinical Practice   Recommendations 2008,  Diabetes Care,  2008, 31:(Suppl 1).    NM PET Image Restag (PS) Skull Base To Thigh  Result Date: 01/22/2020 CLINICAL DATA:  Subsequent treatment strategy for non-small cell lung cancer. EXAM: NUCLEAR MEDICINE PET SKULL BASE TO THIGH TECHNIQUE: 8.6 mCi F-18 FDG was injected intravenously. Full-ring PET imaging was performed from the skull base to thigh after the radiotracer. CT data was obtained and used for attenuation correction and anatomic localization. Fasting blood glucose: 97 mg/dl COMPARISON:  October 15, 2019 FINDINGS: Mediastinal blood pool activity: SUV max Liver activity: SUV max NA NECK: No hypermetabolic lymph nodes in the neck. Incidental CT findings: Stable small LEFT level 2 lymph node (image 27, series 4) 6 mm. This previously displays SUV of 1.9 CHEST: Nodule with central cavitation in the LEFT chest decreased in size potentially but with limited assessment on study with free breathing. Currently approximately 12 mm. Previously approximately 16 mm with respect to solid component. There is diminished ground-glass attenuation around this area. (SUVmax = 7.9) previously 8.4 Small area of ground-glass nodularity increasing in conspicuity since the previous study approximately 6-7 mm on today's exam previously approximately 5 mm (image 18, series 8) Incidental CT findings: Calcified atheromatous plaque of the thoracic aorta without aneurysm. No pericardial effusion. Post treatment changes in the RIGHT chest with paramediastinal fibrosis. Airways are patent. ABDOMEN/PELVIS: No abnormal hypermetabolic activity within the liver, pancreas, adrenal glands, or spleen. No hypermetabolic lymph nodes in the abdomen or pelvis. Incidental CT findings: Post cholecystectomy. No acute pancreatic process. Spleen normal size. Adrenal glands are normal. No hydronephrosis. LEFT renal cysts. No acute gastrointestinal process. Post hysterectomy. SKELETON: No focal hypermetabolic activity to suggest skeletal  metastasis. Incidental CT findings: Signs of prior trauma to  bilateral ribs. Similar to prior study with healed rib fractures. Osteopenia. Spinal degenerative changes. LEFT hip arthroplasty. IMPRESSION: 1. Area of concern in the LEFT upper lobe shows persistent FDG uptake and shows convex borders, this configuration remains suspicious for residual disease though is just outside of 2 months post therapy with SBRT, findings require short interval follow-up to determine significance. 2. Slight increasing conspicuity suggested of LEFT upper lobe ground-glass nodule. Metastatic lesion or metachronous primary developing this area is considered. Attention on follow-up. 3. Post radiation changes along the RIGHT upper lobe with similar appearance. 4. Diminished FDG uptake in a small lymph node in the LEFT neck, potentially reactive, attention on follow-up. 5.  Aortic Atherosclerosis (ICD10-I70.0). Electronically Signed   By: Zetta Bills M.D.   On: 01/22/2020 09:40    Assessment & Plan:   There are no diagnoses linked to this encounter.   Meds ordered this encounter  Medications  . HYDROcodone-acetaminophen (NORCO) 10-325 MG tablet    Sig: Take 1 tablet by mouth every 6 (six) hours as needed for severe pain. Please fill on or after 02/18/20    Dispense:  120 tablet    Refill:  0  . HYDROcodone-acetaminophen (NORCO) 10-325 MG tablet    Sig: Take 1 tablet by mouth every 6 (six) hours as needed for up to 5 days for severe pain.    Dispense:  120 tablet    Refill:  0    Please fill on or after 03/19/20  . HYDROcodone-acetaminophen (NORCO) 10-325 MG tablet    Sig: Take 1 tablet by mouth every 6 (six) hours as needed for up to 5 days for severe pain.    Dispense:  120 tablet    Refill:  0    Please fill on or after 04/18/20     Follow-up: No follow-ups on file.  Walker Kehr, MD

## 2020-01-28 NOTE — Progress Notes (Addendum)
Subjective:   Kaylee Hansen is a 64 y.o. female who presents for Medicare Annual (Subsequent) preventive examination.  Review of Systems    No ROS. Medicare Wellness Visit. Additional risk factors are reflected in social history. Cardiac Risk Factors include: advanced age (>8men, >2 women);dyslipidemia;family history of premature cardiovascular disease;hypertension;smoking/ tobacco exposure     Objective:    Today's Vitals   01/28/20 1101 01/28/20 1132  BP: (!) 140/50   Pulse: 88   Temp: 98.9 F (37.2 C)   TempSrc: Oral   SpO2: 93%   Weight: 166 lb (75.3 kg)   Height: 5\' 5"  (1.651 m)   PainSc:  0-No pain   Body mass index is 27.62 kg/m.  Advanced Directives 01/28/2020 01/23/2020 01/01/2020 11/07/2019 10/29/2019 10/08/2019 10/01/2019  Does Patient Have a Medical Advance Directive? No No No No No No No  Would patient like information on creating a medical advance directive? No - Patient declined No - Patient declined No - Patient declined No - Patient declined No - Patient declined No - Patient declined No - Patient declined  Pre-existing out of facility DNR order (yellow form or pink MOST form) - - - - - - -    Current Medications (verified) Outpatient Encounter Medications as of 01/28/2020  Medication Sig   albuterol (PROVENTIL) (2.5 MG/3ML) 0.083% nebulizer solution Take 3 mLs (2.5 mg total) by nebulization every 4 (four) hours as needed for wheezing or shortness of breath.   ALPRAZolam (XANAX) 0.25 MG tablet Take 1 tablet (0.25 mg total) by mouth at bedtime as needed for anxiety.   bisacodyl (DULCOLAX) 5 MG EC tablet Take 2 tablets (10 mg total) by mouth at bedtime. (Patient taking differently: Take 10 mg by mouth daily as needed for moderate constipation. )   cefUROXime (CEFTIN) 250 MG tablet Take 1 tablet (250 mg total) by mouth 2 (two) times daily with a meal.   clopidogrel (PLAVIX) 75 MG tablet TAKE 1 TABLET (75 MG TOTAL) BY MOUTH DAILY WITH BREAKFAST. (Patient taking  differently: Take 75 mg by mouth daily. )   diltiazem (CARDIZEM CD) 120 MG 24 hr capsule TAKE 1 CAPSULE BY MOUTH AT BEDTIME. (Patient taking differently: Take 120 mg by mouth daily. )   diphenhydrAMINE HCl, Sleep, (ZZZQUIL) 25 MG CAPS Take 50 mg by mouth at bedtime as needed (sleep).   furosemide (LASIX) 20 MG tablet Take 1 tablet by mouth once daily (Patient taking differently: Take 20 mg by mouth daily. )   guaiFENesin (MUCINEX) 600 MG 12 hr tablet Take 1 tablet (600 mg total) by mouth 2 (two) times daily as needed for cough or to loosen phlegm.   HYDROcodone-acetaminophen (NORCO) 10-325 MG tablet Take 1 tablet by mouth every 6 (six) hours as needed for severe pain. Please fill on or after 02/18/20   hydroxypropyl methylcellulose / hypromellose (ISOPTO TEARS / GONIOVISC) 2.5 % ophthalmic solution Place 1 drop into both eyes 3 (three) times daily as needed for dry eyes.   isosorbide mononitrate (IMDUR) 30 MG 24 hr tablet Take 1 tablet by mouth once daily   ketoconazole (NIZORAL) 2 % cream APPLY TO AFFECTED AREA EVERY DAY   levothyroxine (SYNTHROID) 150 MCG tablet Take 1 tablet (150 mcg total) by mouth daily.   methylPREDNISolone (MEDROL DOSEPAK) 4 MG TBPK tablet Take 6-5-4-3-2-1.  Start today   nitroGLYCERIN (NITROSTAT) 0.4 MG SL tablet DISSOLVE ONE TABLET UNDER THE TONGUE EVERY 5 MINUTES AS NEEDED FOR CHEST PAIN.  DO NOT EXCEED A TOTAL  OF 3 DOSES IN 15 MINUTES   OXYGEN Inhale 2.5 L/min into the lungs at bedtime.   pantoprazole (PROTONIX) 40 MG tablet TAKE 1 TABLET BY MOUTH DAILY BEFORE BREAKFAST (Patient taking differently: Take 40 mg by mouth daily. )   PARoxetine (PAXIL) 40 MG tablet Take 1 tablet (40 mg total) by mouth daily.   phenytoin (DILANTIN) 100 MG ER capsule TAKE 100 MG EVERY MORNING AND 200 MG EVERY NIGHT. (Patient taking differently: Take 100-200 mg by mouth See admin instructions. TAKE 100 MG EVERY MORNING AND 200 MG EVERY NIGHT.)   promethazine (PHENERGAN) 25 MG tablet Take 1 tablet  (25 mg total) by mouth every 8 (eight) hours as needed for nausea or vomiting.   Respiratory Therapy Supplies (FLUTTER) DEVI As directed   senna-docusate (SENOKOT S) 8.6-50 MG tablet Take 2 tablets by mouth at bedtime.   SUMAtriptan (IMITREX) 100 MG tablet May repeat in 2 hours if headache persists or recurs.   traZODone (DESYREL) 100 MG tablet TAKE 1 TABLET BY MOUTH EVERYDAY AT BEDTIME (Patient taking differently: Take 100 mg by mouth at bedtime as needed for sleep. )   Vitamin D, Ergocalciferol, (DRISDOL) 1.25 MG (50000 UT) CAPS capsule TAKE ONE CAPSULE BY MOUTH ONE TIME PER WEEK (Patient taking differently: Take 50,000 Units by mouth every Monday. )   [DISCONTINUED] HYDROcodone-acetaminophen (NORCO) 10-325 MG tablet Take 1 tablet by mouth every 6 (six) hours as needed for severe pain. Please fill on or after 10/16/19   [DISCONTINUED] ketoconazole (NIZORAL) 2 % cream APPLY TO AFFECTED AREA EVERY DAY   albuterol (PROVENTIL HFA;VENTOLIN HFA) 108 (90 Base) MCG/ACT inhaler Inhale 2 puffs into the lungs every 6 (six) hours as needed for wheezing or shortness of breath.   clobetasol (TEMOVATE) 0.05 % external solution Apply 1 application topically 2 (two) times daily. For itchy scalp lesions   HYDROcodone-acetaminophen (NORCO) 10-325 MG tablet Take 1 tablet by mouth every 6 (six) hours as needed for up to 5 days for severe pain.   HYDROcodone-acetaminophen (NORCO) 10-325 MG tablet Take 1 tablet by mouth every 6 (six) hours as needed for up to 5 days for severe pain.   [DISCONTINUED] calcitRIOL (ROCALTROL) 0.25 MCG capsule Take 1 capsule (0.25 mcg total) by mouth daily.   No facility-administered encounter medications on file as of 01/28/2020.    Allergies (verified) Nitrofuran derivatives, Oxycodone hcl, Morphine, Penicillins, and Tape   History: Past Medical History:  Diagnosis Date   Acute respiratory failure following trauma and surgery ([place]) 08/26/2012   Anxiety    Aortic insufficiency with  aortic stenosis 04/2017   TTE December 2019: Mild AS with severe regurgitation.  Mild LA dilation.;;  TEE January 2016: Severe-type III aortic regurgitation (holodiastolic flow reversal in the a sending aorta & vena contracta =6.    CAD S/P percutaneous coronary angioplasty 11/2017   Proximal RCA PCI Synergy DES 3.5 mm x 16 mm (3.8 mm). Ost-mLM 30%. Ost-prox Cx 40%.   Collagenous colitis    Colon adenomas 2011   Constipation    Chronic abdominal pain and constipation   COPD (chronic obstructive pulmonary disease) (HCC)    Depression    Diverticulosis    Esophageal stricture 11/08/2012   Ulcer noted in 2010   GERD (gastroesophageal reflux disease)    Helicobacter pylori gastritis 2010   Pylera Tx   History of cholecystectomy    Hx of appendectomy    Hx of cancer of lung 1999   Hx of hysterectomy    Hyperlipidemia  Hypothyroidism    Iron deficiency anemia due to chronic blood loss 10/01/2019   Low back pain    Non-STEMI (non-ST elevated myocardial infarction) ([place]) 11/2017   RCA PCI   Osteoarthritis of knee    bilateral knee   Seizures (HCC)    Stroke (HCC)    CVA, hx of 97   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[place])    Past Surgical History:  Procedure Laterality Date   ABDOMINAL HYSTERECTOMY     complete 1992   ABDOMINAL SURGERY     Exploratory   APPENDECTOMY     BALLOON DILATION N/A 11/08/2012   Procedure: BALLOON DILATION;  Surgeon: Gatha Mayer, MD;  Location: [facility];  Service: Endoscopy;  Laterality: N/A;   CHOLECYSTECTOMY     COLONOSCOPY W/ BIOPSIES     multiple   CORONARY STENT INTERVENTION N/A 06/28/2018   Procedure: CORONARY STENT INTERVENTION;  Surgeon: Martinique, Peter M, MD;  Location: [facility];  Service: Cardiovascular;  Laterality: N/A;  90% prox RCA -> PCI with Synergy DES 3.5 mm x 16 mm (3.8 mm).   CT CTA CORONARY W/CA SCORE W/CM &/OR WO/CM  05/2017   Coronary calcium score 2.9. Intermediate risk. Unusual noncalcified plaque in RCA --FFR negative    ESOPHAGOGASTRODUODENOSCOPY     w/baloon x 2   ESOPHAGOGASTRODUODENOSCOPY N/A 11/08/2012   Procedure: ESOPHAGOGASTRODUODENOSCOPY (EGD);  Surgeon: Gatha Mayer, MD;  Location: [facility];  Service: Endoscopy;  Laterality: N/A;   ESOPHAGOGASTRODUODENOSCOPY (EGD) WITH PROPOFOL N/A 08/25/2019   Procedure: ESOPHAGOGASTRODUODENOSCOPY (EGD) WITH PROPOFOL;  Surgeon: Gatha Mayer, MD;  Location: [facility];  Service: Endoscopy;  Laterality: N/A;   LEFT HEART CATH AND CORONARY ANGIOGRAPHY N/A 06/28/2018   Procedure: LEFT HEART CATH AND CORONARY ANGIOGRAPHY;  Surgeon: Martinique, Peter M, MD;  Location: [place] [facility];  Service: Cardiovascular:  90% prox RCA -> DES PCI. Ost-mLM 30%. Ost-prox Cx 40%.   EF 55-65%. LVEDP 15 mmHg.    MALONEY DILATION  08/25/2019   Procedure: MALONEY DILATION;  Surgeon: Gatha Mayer, MD;  Location: [facility];  Service: Endoscopy;;   RIGHT HEART CATH N/A 07/11/2018   Procedure: RIGHT HEART CATH;  Surgeon: Leonie Man, MD;  Location: [place] [facility];;   Relatively normal Right Heart Cath pressures: PA pressure 26/14 mmHg-mean 20 mmHg; PCWP 13 mmHg.  RAP 6 mmHg, RVP 28/3 mmHg-EDP 10 mmHg. Severely reduced cardiac output and index by Fick: 3.63 and 2.07.   TEE WITHOUT CARDIOVERSION N/A 07/11/2018   Procedure: TRANSESOPHAGEAL ECHOCARDIOGRAM (TEE);  Surgeon: [name], MD;  Location: [facility];  Service: Cardiology;;  Severe aortic regurgitation-type III. (suggested by holodiastolic flow reversal and descending aorta-vena contracta 6 mm).   TOTAL HIP ARTHROPLASTY     Left   TRANSTHORACIC ECHOCARDIOGRAM  05/2018   EF 60-65%.  No R WMA.  GR 1 DD.  Mild aortic stenosis with severe regurgitation.  Mild MR..  Only mild LV dilation noted.   TRANSTHORACIC ECHOCARDIOGRAM  12/2018    EF 60-65%. Mild LVH. Gr1 DD. Mod AoV thickening. Mod-Severe AI, ~ mlid AS.   Family History  Problem Relation Age of Onset   Coronary artery disease Mother    Diabetes Mother     Hypertension Father    Diabetes Son    Coronary artery disease Other        grandmother, grandfather   Kidney disease Other        aunt   Kidney cancer Sister    Colon  cancer Neg Hx        colon   Social History   Socioeconomic History   Marital status: Married    Spouse name: Not on file   Number of children: 2   Years of education: Not on file   Highest education level: Not on file  Occupational History   Occupation: disabled    Employer: DISABLED  Tobacco Use   Smoking status: Current Every Day Smoker    Packs/day: 0.50    Years: 48.00    Pack years: 24.00    Types: Cigarettes   Smokeless tobacco: Never Used   Tobacco comment: 10/01/19 - 7 cigs/day  Vaping Use   Vaping Use: Never used  Substance and Sexual Activity   Alcohol use: No   Drug use: No   Sexual activity: Not Currently  Other Topics Concern   Not on file  Social History Narrative   Married   2 children   No regular exercise         Social Determinants of Health   Financial Resource Strain: Low Risk    Difficulty of Paying Living Expenses: Not hard at all  Food Insecurity: No Food Insecurity   Worried About Charity fundraiser in the Last Year: Never true   Ran Out of Food in the Last Year: Never true  Transportation Needs: No Transportation Needs   Lack of Transportation (Medical): No   Lack of Transportation (Non-Medical): No  Physical Activity: Inactive   Days of Exercise per Week: 0 days   Minutes of Exercise per Session: 0 min  Stress: No Stress Concern Present   Feeling of Stress : Not at all  Social Connections:    Frequency of Communication with Friends and Family:    Frequency of Social Gatherings with Friends and Family:    Attends Religious Services:    Active Member of Clubs or Organizations:    Attends Archivist Meetings:    Marital Status:     Tobacco Counseling Ready to quit: Not Answered Counseling given: Not Answered Comment: 10/01/19 - 7  cigs/day   Clinical Intake:  Pre-visit preparation completed: Yes  Pain : No/denies pain Pain Score: 0-No pain     BMI - recorded: 27.62 Nutritional Status: BMI 25 -29 Overweight Nutritional Risks: None Diabetes: No  How often do you need to have someone help you when you read instructions, pamphlets, or other written materials from your doctor or pharmacy?: 1 - Never What is the last grade level you completed in school?: 11th grade  Diabetic? NO  Interpreter Needed?: No  Information entered by :: Lisette Abu, LPN   Activities of Daily Living In your present state of health, do you have any difficulty performing the following activities: 01/28/2020 10/08/2019  Hearing? N N  Vision? N N  Difficulty concentrating or making decisions? Y N  Walking or climbing stairs? Y Y  Comment - uses power chair  Dressing or bathing? Y Y  Comment - shortness of breath  Doing errands, shopping? Y Y  Comment - depends on husband  Conservation officer, nature and eating ? Y Y  Comment - depends on husband  Using the Toilet? Y N  In the past six months, have you accidently leaked urine? Y N  Do you have problems with loss of bowel control? - N  Managing your Medications? Y N  Managing your Finances? Y N  Housekeeping or managing your Housekeeping? Y Y  Comment - depends on husband  Some recent data might be hidden    Patient Care Team: Plotnikov, [name], MD as PCP - General Leonie Man, MD as PCP - Cardiology (Cardiology) Gatha Mayer, MD (Gastroenterology) Carolan Clines, MD (Inactive) as Attending Physician (Urology) Erline Levine, MD as Consulting Physician (Neurosurgery) Valente David, RN as [place] any recent Medical Services you may have received from other than [facility] providers in the past year (date may be approximate).     Assessment:   This is a routine wellness examination for [place].  Hearing/Vision screen No exam  data present  Dietary issues and exercise activities discussed: Current Exercise Habits: The patient does not participate in regular exercise at present, Exercise limited by: respiratory conditions(s);cardiac condition(s);orthopedic condition(s);psychological condition(s);neurologic condition(s)  Goals       I want my breathing to get better (pt-stated)      CARE PLAN ENTRY (see longtitudinal plan of care for additional care plan information)  Current Barriers:  Knowledge deficits related to basic understanding of COPD disease process Knowledge deficit related to basic understanding of how to use inhalers and how inhaled medications work   Case Manager Clinical Goal(s): Over the next 28 days patient will report using inhalers as prescribed including rinsing mouth after use Over the next 45 days, patient will be able to verbalize understanding of COPD action plan and when to seek appropriate levels of medical care Over the next 28 days, patient will engage in lite exercise as tolerated to build/regain stamina and strength and reduce shortness of breath through activity tolerance   Interventions:  Provided patient with COPD action plan and reinforced importance of daily self assessment Provided instruction about proper use of medications used for management of COPD including inhalers Advised patient to self assesses COPD action plan zone and make appointment with provider if in the yellow zone for 48 hours without improvement.  Patient Self Care Activities:  Takes medications as prescribed including inhalers Self assesses COPD action plan zone and makes appointment with provider if in the yellow zone for 48 hours without improvement. Utilizes infection prevention strategies to reduce risk of respiratory infection   Initial goal documentation        Depression Screen PHQ 2/9 Scores 01/28/2020 10/08/2019 04/04/2017 10/03/2016  PHQ - 2 Score 1 3 6  -  PHQ- 9 Score - 13 11 -  Exception  Documentation - (No Data) - Medical reason    Fall Risk Fall Risk  01/28/2020 10/08/2019 10/03/2016  Falls in the past year? 1 1 Yes  Number falls in past yr: 1 1 2  or more  Injury with Fall? 0 0 No  Risk for fall due to : Impaired balance/gait;Impaired mobility History of fall(s) -  Follow up - Falls prevention discussed -    Any stairs in or around the home? No  If so, are there any without handrails? No  Home free of loose throw rugs in walkways, pet beds, electrical cords, etc? Yes  Adequate lighting in your home to reduce risk of falls? Yes   ASSISTIVE DEVICES UTILIZED TO PREVENT FALLS:  Life alert? No  Use of a cane, walker or w/c? Yes  Grab bars in the bathroom? No  Shower chair or bench in shower? No  Elevated toilet seat or a handicapped toilet? No   TIMED UP AND GO:  Was the test performed? No .  Length of time to ambulate 10 feet: 0 sec.   APPEARANCE OF GAIT: Patient was in  a wheelchair  Cognitive Function:     6CIT Screen 01/28/2020  What Year? 0 points  What month? 0 points  What time? 3 points  Count back from 20 0 points  Months in reverse 2 points  Repeat phrase 2 points  Total Score 7    Immunizations Immunization History  Administered Date(s) Administered   Fluad Quad(high Dose 65+) 04/03/2019   Influenza Split 03/27/2012   Influenza Whole 05/08/2007, 04/08/2009, 03/24/2011   Influenza, High Dose Seasonal PF 04/04/2017   Influenza,inj,Quad PF,6+ Mos 03/05/2013, 05/01/2014, 03/05/2015, 04/17/2016, 03/13/2018   Pneumococcal Conjugate-13 07/04/2016   Pneumococcal Polysaccharide-23 07/14/2015   Td 04/13/2016   Tdap 07/14/2015    TDAP status: Up to date Flu Vaccine status: Up to date Pneumococcal vaccine status: Up to date Covid-19 vaccine status: Completed vaccines  Qualifies for Shingles Vaccine? Yes   Zostavax completed No   Shingrix Completed?: No.    Education has been provided regarding the importance of this vaccine. Patient has been  advised to call insurance company to determine out of pocket expense if they have not yet received this vaccine. Advised may also receive vaccine at local pharmacy or Health Dept. Verbalized acceptance and understanding.  Screening Tests Health Maintenance  Topic Date Due   Hepatitis C Screening  Never done   COVID-19 Vaccine (1) Never done   DEXA SCAN  Never done   COLONOSCOPY  10/29/2019   INFLUENZA VACCINE  01/25/2020   PNA vac Low Risk Adult (2 of 2 - PPSV23) 07/13/2020   MAMMOGRAM  01/06/2021   TETANUS/TDAP  04/13/2026    Health Maintenance  Health Maintenance Due  Topic Date Due   Hepatitis C Screening  Never done   COVID-19 Vaccine (1) Never done   DEXA SCAN  Never done   COLONOSCOPY  10/29/2019   INFLUENZA VACCINE  01/25/2020    Colorectal cancer screening: Completed 10/28/2009. Repeat every 10 years Mammogram status: Completed 01/07/2019. Repeat every year   Lung Cancer Screening: (Low Dose CT Chest recommended if Age 17-80 years, 30 pack-year currently smoking OR have quit w/in 15years.) does qualify.   Lung Cancer Screening Referral: goes to pulmonology; who has scheduled yearly CT Scans  Additional Screening:  Hepatitis C Screening: does qualify; Completed no  Vision Screening: Recommended annual ophthalmology exams for early detection of glaucoma and other disorders of the eye. Is the patient up to date with their annual eye exam?  Yes  Who is the provider or what is the name of the office in which the patient attends annual eye exams? [facility] If pt is not established with a provider, would they like to be referred to a provider to establish care? No .   Dental Screening: Recommended annual dental exams for proper oral hygiene  Community Resource Referral / Chronic Care Management: CRR required this visit?  No   CCM required this visit?  No      Plan:     I have personally reviewed and noted the following in the patient's chart:   Medical and  social history Use of alcohol, tobacco or illicit drugs  Current medications and supplements Functional ability and status Nutritional status Physical activity Advanced directives List of other physicians Hospitalizations, surgeries, and ER visits in previous 12 months Vitals Screenings to include cognitive, depression, and falls Referrals and appointments  In addition, I have reviewed and discussed with patient certain preventive protocols, quality metrics, and best practice recommendations. A written personalized care plan for preventive services as  well as general preventive health recommendations were provided to patient.     Sheral Flow, LPN   12/29/1605   Nurse Notes: n/a    Medical screening examination/treatment/procedure(s) were performed by non-physician practitioner and as supervising physician I was immediately available for consultation/collaboration.  I agree with above. Lew Dawes, MD

## 2020-02-02 DIAGNOSIS — Z1231 Encounter for screening mammogram for malignant neoplasm of breast: Secondary | ICD-10-CM | POA: Diagnosis not present

## 2020-02-02 LAB — HM MAMMOGRAPHY

## 2020-02-04 ENCOUNTER — Other Ambulatory Visit: Payer: Self-pay | Admitting: *Deleted

## 2020-02-04 NOTE — Patient Outreach (Signed)
[facility]) Care Management  02/04/2020  RAIGEN JAGIELSKI 03-20-1956 [id number]   Call placed to member to follow up on management of COPD and lung cancer, no answer.  HIPAA compliant voice message left, will follow up within the next 3-4 business days.  Valente David, [place], MSN [place] [phone]

## 2020-02-06 ENCOUNTER — Other Ambulatory Visit: Payer: Self-pay | Admitting: Internal Medicine

## 2020-02-09 ENCOUNTER — Other Ambulatory Visit: Payer: Self-pay | Admitting: *Deleted

## 2020-02-09 NOTE — Patient Outreach (Signed)
[facility]) Care Management  02/09/2020  Kaylee Hansen 01-02-1956 [id number]   Outreach attempt #2, unsuccessful.  Call placed to member to follow up on management of COPD and lung cancer, no answer.  HIPAA compliant voice message left.  Will send unsuccessful outreach letter and follow up within the next 3-4 business days.  Valente David, [place], MSN [place] [phone]

## 2020-02-13 ENCOUNTER — Other Ambulatory Visit: Payer: Self-pay | Admitting: *Deleted

## 2020-02-13 NOTE — Patient Outreach (Signed)
[facility]) Care Management  02/13/2020  Kaylee Hansen 06-13-1956 [id number]   Outreach attempt #3, unsuccessful.  Call placed to member to follow up on management of COPD and lung cancer, no answer. HIPAA compliant voice message left.  Will follow up with 4th and final attempt within the next 4 weeks, if remain unsuccessful, will close case due to inability to maintain contact.  Valente David, [place], MSN [place] [phone]

## 2020-02-15 ENCOUNTER — Other Ambulatory Visit: Payer: Self-pay | Admitting: Internal Medicine

## 2020-02-17 DIAGNOSIS — I509 Heart failure, unspecified: Secondary | ICD-10-CM | POA: Diagnosis not present

## 2020-02-17 DIAGNOSIS — E261 Secondary hyperaldosteronism: Secondary | ICD-10-CM | POA: Diagnosis not present

## 2020-02-17 DIAGNOSIS — R69 Illness, unspecified: Secondary | ICD-10-CM | POA: Diagnosis not present

## 2020-02-17 DIAGNOSIS — E039 Hypothyroidism, unspecified: Secondary | ICD-10-CM | POA: Diagnosis not present

## 2020-02-17 DIAGNOSIS — I739 Peripheral vascular disease, unspecified: Secondary | ICD-10-CM | POA: Diagnosis not present

## 2020-02-17 DIAGNOSIS — I69354 Hemiplegia and hemiparesis following cerebral infarction affecting left non-dominant side: Secondary | ICD-10-CM | POA: Diagnosis not present

## 2020-02-17 DIAGNOSIS — J449 Chronic obstructive pulmonary disease, unspecified: Secondary | ICD-10-CM | POA: Diagnosis not present

## 2020-02-17 DIAGNOSIS — C3490 Malignant neoplasm of unspecified part of unspecified bronchus or lung: Secondary | ICD-10-CM | POA: Diagnosis not present

## 2020-02-17 DIAGNOSIS — Z008 Encounter for other general examination: Secondary | ICD-10-CM | POA: Diagnosis not present

## 2020-02-17 DIAGNOSIS — G40909 Epilepsy, unspecified, not intractable, without status epilepticus: Secondary | ICD-10-CM | POA: Diagnosis not present

## 2020-02-20 ENCOUNTER — Encounter: Payer: Self-pay | Admitting: Internal Medicine

## 2020-02-24 ENCOUNTER — Other Ambulatory Visit: Payer: Self-pay | Admitting: Cardiology

## 2020-02-29 ENCOUNTER — Other Ambulatory Visit: Payer: Self-pay | Admitting: Cardiology

## 2020-03-12 ENCOUNTER — Other Ambulatory Visit: Payer: Self-pay | Admitting: *Deleted

## 2020-03-12 NOTE — Patient Outreach (Signed)
[facility]) Care Management  03/12/2020  Kaylee Hansen 1956/04/14 [id number]   Outreach attempt #4, unsuccessful.  HIPAA compliant voice message left.  No response from member after multiple unsuccessful outreach attempts and letter sent.  Will close case at this time due to inability to maintain contact.  Will notify member and primary MD of case closure.  Valente David, [place], MSN [place] [phone]

## 2020-03-17 ENCOUNTER — Ambulatory Visit (HOSPITAL_COMMUNITY)
Admission: RE | Admit: 2020-03-17 | Discharge: 2020-03-17 | Disposition: A | Discharge status: Home / Self Care | Payer: Medicare HMO | Source: Ambulatory Visit | Attending: Hematology & Oncology | Admitting: Hematology & Oncology

## 2020-03-17 ENCOUNTER — Other Ambulatory Visit: Payer: Self-pay

## 2020-03-17 ENCOUNTER — Encounter: Payer: Self-pay | Admitting: *Deleted

## 2020-03-17 DIAGNOSIS — I2510 Atherosclerotic heart disease of native coronary artery without angina pectoris: Secondary | ICD-10-CM | POA: Insufficient documentation

## 2020-03-17 DIAGNOSIS — C3490 Malignant neoplasm of unspecified part of unspecified bronchus or lung: Secondary | ICD-10-CM | POA: Diagnosis present

## 2020-03-17 DIAGNOSIS — J90 Pleural effusion, not elsewhere classified: Secondary | ICD-10-CM | POA: Insufficient documentation

## 2020-03-17 DIAGNOSIS — I700 Atherosclerosis of aorta: Secondary | ICD-10-CM | POA: Diagnosis not present

## 2020-03-17 DIAGNOSIS — J439 Emphysema, unspecified: Secondary | ICD-10-CM | POA: Insufficient documentation

## 2020-03-17 DIAGNOSIS — C3412 Malignant neoplasm of upper lobe, left bronchus or lung: Secondary | ICD-10-CM | POA: Diagnosis not present

## 2020-03-17 LAB — GLUCOSE, CAPILLARY: Glucose-Capillary: 111 mg/dL — ABNORMAL HIGH (ref 70–99)

## 2020-03-17 MED ORDER — FLUDEOXYGLUCOSE F - 18 (FDG) INJECTION
8.7000 | Freq: Once | INTRAVENOUS | Status: AC | PRN
  Administered 2020-03-17: 8.7 via INTRAVENOUS

## 2020-03-18 ENCOUNTER — Encounter: Payer: Self-pay | Admitting: Cardiology

## 2020-03-18 ENCOUNTER — Ambulatory Visit: Payer: Medicare HMO | Admitting: Cardiology

## 2020-03-18 VITALS — BP 112/50 | HR 94 | Ht 65.0 in | Wt 179.0 lb

## 2020-03-18 DIAGNOSIS — I25119 Atherosclerotic heart disease of native coronary artery with unspecified angina pectoris: Secondary | ICD-10-CM | POA: Diagnosis not present

## 2020-03-18 DIAGNOSIS — R0782 Intercostal pain: Secondary | ICD-10-CM

## 2020-03-18 DIAGNOSIS — I351 Nonrheumatic aortic (valve) insufficiency: Secondary | ICD-10-CM | POA: Diagnosis not present

## 2020-03-18 DIAGNOSIS — Z720 Tobacco use: Secondary | ICD-10-CM

## 2020-03-18 DIAGNOSIS — Z955 Presence of coronary angioplasty implant and graft: Secondary | ICD-10-CM | POA: Diagnosis not present

## 2020-03-18 DIAGNOSIS — E785 Hyperlipidemia, unspecified: Secondary | ICD-10-CM | POA: Diagnosis not present

## 2020-03-18 DIAGNOSIS — I10 Essential (primary) hypertension: Secondary | ICD-10-CM

## 2020-03-18 MED ORDER — NITROGLYCERIN 0.4 MG SL SUBL: SUBLINGUAL_TABLET | SUBLINGUAL | 6 refills | Status: DC

## 2020-03-18 MED ORDER — ISOSORBIDE MONONITRATE ER 60 MG PO TB24: 60.0000 mg | ORAL_TABLET | Freq: Every day | ORAL | 3 refills | Status: DC

## 2020-03-18 MED ORDER — ROSUVASTATIN CALCIUM 20 MG PO TABS
20.0000 mg | ORAL_TABLET | Freq: Every day | ORAL | 3 refills | Status: DC
Start: 2020-03-18 — End: 2021-03-07

## 2020-03-18 NOTE — Patient Instructions (Addendum)
Medication Instructions:  INCREASE YOUR IMDUR TO 60 MG DAILY.   RESTART ROSUVASTATIN 20 MG DAILY  *If you need a refill on your cardiac medications before your next appointment, please call your pharmacy*   Testing/Procedures: Your physician has requested that you have an echocardiogram. Echocardiography is a painless test that uses sound waves to create images of your heart. It provides your doctor with information about the size and shape of your heart and how well your heart's chambers and valves are working. This procedure takes approximately one hour. There are no restrictions for this procedure. THIS TEST IS DONE AT [facility] FOR January 2022.    Follow-Up: At [facility], you and your health needs are our priority.  As part of our continuing mission to provide you with exceptional heart care, we have created designated Provider Care Teams.  These Care Teams include your primary Cardiologist (physician) and Advanced Practice Providers (APPs -  Physician Assistants and Nurse Practitioners) who all work together to provide you with the care you need, when you need it.  We recommend signing up for the patient portal called "MyChart".  Sign up information is provided on this After Visit Summary.  MyChart is used to connect with patients for Virtual Visits (Telemedicine).  Patients are able to view lab/test results, encounter notes, upcoming appointments, etc.  Non-urgent messages can be sent to your provider as well.   To learn more about what you can do with MyChart, go to [email or web address].    Your next appointment:   6 month(s)  The format for your next appointment:   In Person  Provider:   You may see [name], MD or one of the following Advanced Practice Providers on your designated Care Team:    Rosaria Ferries, PA-C  Jory Sims, DNP, ANP

## 2020-03-18 NOTE — Progress Notes (Signed)
Primary Care Provider: Cassandria Anger, MD Cardiologist: [name], MD Electrophysiologist: None  Oncologist: Dr. Marin Olp  Clinic Note: Chief Complaint  Patient presents with  . Follow-up    Has been diagnosed with recurrent lung cancer since last visit  . Coronary Artery Disease    Still uses intermittent nitroglycerin, but symptoms sound may be not anginal.  . Cardiac Valve Problem    Aortic insufficiency-not felt to be surgical candidate.  [name] COPD    Severe-Home oxygen    HPI:    Kaylee Hansen is a 64 y.o. female with a PMH CAD (non-STEMI-RCA PCI), MODERATE-SEVERE AI (not felt to be open AVR candidate), long-term smoker with severe COPD and Lung Cancer (treated with chemo XRT--now with recurrence), essentially wheelchair-bound due to diffuse arthritis who presents today for 23-month follow-up.  Kaylee Hansen was last seen on August 12, 2019 (as usual, her husband provides most history).  She uses a power scooter at home, but comes in in a wheelchair.  Relatively stable from angina standpoint maybe 1 or 2 doses of nitroglycerin in 6 months.  Occasional short bursts of rapid heart rate palpitations but nothing prolonged.  Nothing that lasts more than a minute.  She may occasionally have initial orthopnea, but not true orthopnea or PND.  Mild edema on low-dose Lasix.  Extreme malaise and fatigue.  No changes were made.  She had seen CVTS (Dr. Roxy Manns) to discuss her aortic valve.  Not felt to be an open AVR candidate, and as of yet, aortic insufficiency is not an indication for TAVR.  Plan was to consider titration of Imdur up to 60 mg if she uses more frequent nitroglycerin.  Using diltiazem as opposed to beta-blocker because of COPD.  Recent Hospitalizations:   08/25/2019-EGD for dysphagia: Benign esophageal stenosis-dilated.  (1 benign-appearing intrinsic mild stenosis found in the lower third of the esophagus)  3/19-20/2021: COPD exacerbation along with severe right  hip pain (admitted from orthopedic office).  Was noted to have acute on chronic worsening cough with oxygen sat of 90% on 2 L.  She developed acute chest pain while on the MRI scanner-thought to be panic attack.  IR guided intra-articular cortisone injection,  IV steroids, bronchodilators and Z-Pak.  09/22/2019-ER visit [facility]-> continue left prior to being seen because was too uncomfortable to sit in the emergency room waiting room.  09/25/2019-admitted to [facility] initially with diagnosis of pneumonia, however CT scan showed lingular lung cancer.  Was seen by Dr. Marin Olp on April 6 for hospital follow-up.  Chest CT ordered with consideration of referral to pulmonary medicine for bronchoscopy.  Not thought to be a good surgical candidate leaving XRT is the main stay therapy.  She has had 3 rounds of XRT  Despite the recurrence of cancer, she continues to smoke with no signs of quitting.  Several of her cardiac medications have been stopped.  Reviewed  CV studies:    The following studies were reviewed today: (if available, images/films reviewed: From Epic Chart or Care Everywhere)  CT chest 09/30/2019: 3.1 cm predominantly solid mass in the lingula compatible with primary bronchogenic neoplasm  NM PET scan 6/57/8469: Hypermetabolic 7 mm nodule and adjacent 2.6 cm subsolid nodule in the lingula indicative of primary bronchogenic carcinoma.  Additional lung nodules including a 7 mm apical left upper lobe nodule too small for PET resolution.  Mildly hypermetabolic left level 2 lymph node in neck.  Acute nondisplaced fractures in the lateral left eighth and ninth  ribs.  (Aortic atherosclerosis and coronary artery calcification) --> she has had 2 follow-up PET scans showing reduction in tumor size.  Interval History:   Kaylee Hansen is here today with her husband.  She is a little more talkative today than usual, but seems to be quite down.  She seems somewhat defeated with this new  diagnosis of lung cancer.  Not looking forward to going through treatments.  She initially had been having worsening cough that was productive.  The initial diagnosis was likely pneumonia however she finally had a CT scan that showed likely lingular lung cancer.  The plan is radiation therapy tentatively chemotherapy.  Very difficult for her to tell her symptoms.  She has chronic cough and is chronically dyspneic.  She does not use oxygen except for at night.  Does not tolerate CPAP.  She off and on has chest discomfort for which she takes nitroglycerin but not with any routine maybe 1 or 2 times in the last couple months she has had to use more than 1 dose of nitroglycerin..  She does not do any exertion besides transferring on and off of her wheelchair.  She does have a lot of cough associated with coughing, but also has had some gas.  She remains wheelchair-bound and has lots of aches and pains depending on how she sits.  She gets short of breath with minimal activity.  Despite all this, she still continues to smoke.  She has had a little bit of swelling in his pain back some weight, but does not necessarily have PND orthopnea per se.  No sensation of tachycardia although her resting heart rate is 94 bpm.  No real palpitations.  She gets lightheaded with coughing and dyspnea, but denies any syncope or near syncope.  Does not walk enough to note claudication.   The patient does not have symptoms concerning for COVID-19 infection (fever, chills, cough, or new shortness of breath).  She is not had any fevers or chills to go along with her shortness of breath and cough.  Cough and short of breath is chronic.  REVIEWED OF SYSTEMS   Review of Systems  Constitutional: Positive for malaise/fatigue. Negative for weight loss (She is actually gained weight).  HENT: Positive for congestion. Negative for nosebleeds.   Respiratory: Positive for cough (Has proximal-isms of coughing, usually in the morning; but  did recently have pneumonia), shortness of breath (Baseline, gets worse with any activity) and wheezing. Negative for sputum production (No longer productive).   Cardiovascular: Positive for leg swelling.  Gastrointestinal: Negative for abdominal pain, blood in stool (Although she occasionally has blood when wiping) and melena.  Genitourinary: Negative for hematuria.  Musculoskeletal: Positive for back pain, joint pain and myalgias.  Neurological: Positive for dizziness and weakness (Global leg weakness).  Endo/Heme/Allergies: Bruises/bleeds easily.  Psychiatric/Behavioral: Positive for depression and memory loss. The patient is not nervous/anxious and does not have insomnia.    I have reviewed and (if needed) personally updated the patient's problem list, medications, allergies, past medical and surgical history, social and family history.   PAST MEDICAL HISTORY   Past Medical History:  Diagnosis Date  . Acute respiratory failure following trauma and surgery ([place]) 08/26/2012  . Anxiety   . Aortic insufficiency with aortic stenosis 04/2017   TTE December 2019: Mild AS with severe regurgitation.  Mild LA dilation.;;  TEE January 2016: Severe-type III aortic regurgitation (holodiastolic flow reversal in the a sending aorta & vena contracta =6.   [name]  CAD S/P percutaneous coronary angioplasty 11/2017   Proximal RCA PCI Synergy DES 3.5 mm x 16 mm (3.8 mm). Ost-mLM 30%. Ost-prox Cx 40%.  . Collagenous colitis   . Colon adenomas 2011  . Constipation    Chronic abdominal pain and constipation  . COPD (chronic obstructive pulmonary disease) ([place])   . Depression   . Diverticulosis   . Esophageal stricture 11/08/2012   Ulcer noted in 2010  . GERD (gastroesophageal reflux disease)   . Helicobacter pylori gastritis 2010   Pylera Tx  . History of cholecystectomy   . Hx of appendectomy   . Hx of cancer of lung 1999  . Hx of hysterectomy   . Hyperlipidemia   . Hypothyroidism   . Iron deficiency  anemia due to chronic blood loss 10/01/2019  . Low back pain   . Non-STEMI (non-ST elevated myocardial infarction) ([place]) 11/2017   RCA PCI  . Osteoarthritis of knee    bilateral knee  . Seizures ([place])   . Stroke [facility])    CVA, hx of 97  . 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 ABDOMINAL HYSTERECTOMY     complete 1992  . ABDOMINAL SURGERY     Exploratory  . APPENDECTOMY    . BALLOON DILATION N/A 11/08/2012   Procedure: BALLOON DILATION;  Surgeon: Gatha Mayer, MD;  Location: [facility] ENDOSCOPY;  Service: Endoscopy;  Laterality: N/A;  . CHOLECYSTECTOMY    . COLONOSCOPY W/ BIOPSIES     multiple  . CORONARY STENT INTERVENTION N/A 06/28/2018   Procedure: CORONARY STENT INTERVENTION;  Surgeon: Martinique, Peter M, MD;  Location: [place] CV LAB;  Service: Cardiovascular;  Laterality: N/A;  90% prox RCA -> PCI with Synergy DES 3.5 mm x 16 mm (3.8 mm).  . CT CTA CORONARY W/CA SCORE W/CM &/OR WO/CM  05/2017   Coronary calcium score 2.9. Intermediate risk. Unusual noncalcified plaque in RCA --FFR negative  . ESOPHAGOGASTRODUODENOSCOPY     w/baloon x 2  . ESOPHAGOGASTRODUODENOSCOPY N/A 11/08/2012   Procedure: ESOPHAGOGASTRODUODENOSCOPY (EGD);  Surgeon: Gatha Mayer, MD;  Location: [facility];  Service: Endoscopy;  Laterality: N/A;  . ESOPHAGOGASTRODUODENOSCOPY (EGD) WITH PROPOFOL N/A 08/25/2019   Procedure: ESOPHAGOGASTRODUODENOSCOPY (EGD) WITH PROPOFOL;  Surgeon: Gatha Mayer, MD;  Location: [facility];  Service: Endoscopy;  Laterality: N/A;  . LEFT HEART CATH AND CORONARY ANGIOGRAPHY N/A 06/28/2018   Procedure: LEFT HEART CATH AND CORONARY ANGIOGRAPHY;  Surgeon: Martinique, Peter M, MD;  Location: [place] CV LAB;  Service: Cardiovascular:  90% prox RCA -> DES PCI. Ost-mLM 30%. Ost-prox Cx 40%.   EF 55-65%. LVEDP 15 mmHg.   [name] MALONEY DILATION  08/25/2019   Procedure: MALONEY DILATION;  Surgeon: Gatha Mayer, MD;  Location: [facility]OS[facility];  Service: Endoscopy;;  . RIGHT HEART CATH N/A 07/11/2018   Procedure: RIGHT HEART CATH;  Surgeon: Leonie Man, MD;  Location: [place] CV LAB;;   Relatively normal Right Heart Cath pressures: PA pressure 26/14 mmHg-mean 20 mmHg; PCWP 13 mmHg.  RAP 6 mmHg, RVP 28/3 mmHg-EDP 10 mmHg. Severely reduced cardiac output and index by Fick: 3.63 and 2.07.  . TEE WITHOUT CARDIOVERSION N/A 07/11/2018   Procedure: TRANSESOPHAGEAL ECHOCARDIOGRAM (TEE);  Surgeon: [name], MD;  Location: [facility] ENDOSCOPY;  Service: Cardiology;;  Severe aortic regurgitation-type III. (suggested by holodiastolic flow reversal and descending aorta-vena contracta 6 mm).  . TOTAL HIP ARTHROPLASTY  Left  . TRANSTHORACIC ECHOCARDIOGRAM  05/2018   EF 60-65%.  No R WMA.  GR 1 DD.  Mild aortic stenosis with severe regurgitation.  Mild MR..  Only mild LV dilation noted.  . TRANSTHORACIC ECHOCARDIOGRAM  12/2018    EF 60-65%. Mild LVH. Gr1 DD. Mod AoV thickening. Mod-Severe AI, ~ mlid AS.      Immunization History  Administered Date(s) Administered  . Fluad Quad(high Dose 65+) 04/03/2019  . Influenza Split 03/27/2012  . Influenza Whole 05/08/2007, 04/08/2009, 03/24/2011  . Influenza, High Dose Seasonal PF 04/04/2017  . Influenza,inj,Quad PF,6+ Mos 03/05/2013, 05/01/2014, 03/05/2015, 04/17/2016, 03/13/2018  . Pneumococcal Conjugate-13 07/04/2016  . Pneumococcal Polysaccharide-23 07/14/2015  . Td 04/13/2016  . Tdap 07/14/2015    MEDICATIONS/ALLERGIES   Current Meds  Medication Sig  . albuterol (PROVENTIL HFA;VENTOLIN HFA) 108 (90 Base) MCG/ACT inhaler Inhale 2 puffs into the lungs every 6 (six) hours as needed for wheezing or shortness of breath.  [name] albuterol (PROVENTIL) (2.5 MG/3ML) 0.083% nebulizer solution Take 3 mLs (2.5 mg total) by nebulization every 4 (four) hours as needed for wheezing or shortness of breath.  . ALPRAZolam (XANAX) 0.25 MG tablet Take 1 tablet (0.25 mg total) by mouth at  bedtime as needed for anxiety.  . bisacodyl (DULCOLAX) 5 MG EC tablet Take 2 tablets (10 mg total) by mouth at bedtime. (Patient taking differently: Take 10 mg by mouth daily as needed for moderate constipation. )  . clobetasol (TEMOVATE) 0.05 % external solution Apply 1 application topically 2 (two) times daily. For itchy scalp lesions  . clopidogrel (PLAVIX) 75 MG tablet TAKE 1 TABLET (75 MG TOTAL) BY MOUTH DAILY WITH BREAKFAST. (Patient taking differently: Take 75 mg by mouth daily. )  . diltiazem (CARDIZEM CD) 120 MG 24 hr capsule TAKE 1 CAPSULE BY MOUTH AT BEDTIME.  . diphenhydrAMINE HCl, Sleep, (ZZZQUIL) 25 MG CAPS Take 50 mg by mouth at bedtime as needed (sleep).  . furosemide (LASIX) 20 MG tablet Take 1 tablet by mouth once daily  . guaiFENesin (MUCINEX) 600 MG 12 hr tablet Take 1 tablet (600 mg total) by mouth 2 (two) times daily as needed for cough or to loosen phlegm.  [name] HYDROcodone-acetaminophen (NORCO) 10-325 MG tablet Take 1 tablet by mouth every 6 (six) hours as needed for severe pain. Please fill on or after 02/18/20  . HYDROcodone-acetaminophen (NORCO) 10-325 MG tablet Take 1 tablet by mouth every 6 (six) hours as needed for up to 5 days for severe pain.  [name] HYDROcodone-acetaminophen (NORCO) 10-325 MG tablet Take 1 tablet by mouth every 6 (six) hours as needed for up to 5 days for severe pain.  . hydroxypropyl methylcellulose / hypromellose (ISOPTO TEARS / GONIOVISC) 2.5 % ophthalmic solution Place 1 drop into both eyes 3 (three) times daily as needed for dry eyes.  . isosorbide mononitrate (IMDUR) 60 MG 24 hr tablet Take 1 tablet (60 mg total) by mouth daily.  [name] ketoconazole (NIZORAL) 2 % cream APPLY TO AFFECTED AREA EVERY DAY  . levothyroxine (SYNTHROID) 150 MCG tablet Take 1 tablet (150 mcg total) by mouth daily.  . methylPREDNISolone (MEDROL DOSEPAK) 4 MG TBPK tablet Take 6-5-4-3-2-1.  Start today  . nitroGLYCERIN (NITROSTAT) 0.4 MG SL tablet DISSOLVE ONE TABLET UNDER THE TONGUE  EVERY 5 MINUTES AS NEEDED FOR CHEST PAIN.  DO NOT EXCEED A TOTAL OF 3 DOSES IN 15 MINUTES  . OXYGEN Inhale 2.5 L/min into the lungs at bedtime.  . pantoprazole (PROTONIX) 40 MG tablet TAKE 1  TABLET BY MOUTH DAILY BEFORE BREAKFAST (Patient taking differently: Take 40 mg by mouth daily. )  . PARoxetine (PAXIL) 40 MG tablet Take 1 tablet (40 mg total) by mouth daily.  . phenytoin (DILANTIN) 100 MG ER capsule TAKE 100 MG EVERY MORNING AND 200 MG EVERY NIGHT.  [name] promethazine (PHENERGAN) 25 MG tablet Take 1 tablet (25 mg total) by mouth every 8 (eight) hours as needed for nausea or vomiting.  [name] Respiratory Therapy Supplies (FLUTTER) DEVI As directed  . senna-docusate (SENOKOT S) 8.6-50 MG tablet Take 2 tablets by mouth at bedtime.  . SUMAtriptan (IMITREX) 100 MG tablet May repeat in 2 hours if headache persists or recurs.  . traZODone (DESYREL) 100 MG tablet TAKE 1 TABLET BY MOUTH EVERYDAY AT BEDTIME (Patient taking differently: Take 100 mg by mouth at bedtime as needed for sleep. )  . Vitamin D, Ergocalciferol, (DRISDOL) 1.25 MG (50000 UNIT) CAPS capsule Take 1 capsule (50,000 Units total) by mouth every Monday.  . [DISCONTINUED] isosorbide mononitrate (IMDUR) 30 MG 24 hr tablet Take 1 tablet by mouth once daily  . [DISCONTINUED] nitroGLYCERIN (NITROSTAT) 0.4 MG SL tablet DISSOLVE ONE TABLET UNDER THE TONGUE EVERY 5 MINUTES AS NEEDED FOR CHEST PAIN.  DO NOT EXCEED A TOTAL OF 3 DOSES IN 15 MINUTES    Allergies  Allergen Reactions  . Nitrofuran Derivatives Other (See Comments)    confusion  . Oxycodone Hcl Other (See Comments)    Knocked her out for 6 days  . Morphine Other (See Comments)    "Makes me go crazy."   . Penicillins Hives    Has patient had a PCN reaction causing immediate rash, facial/tongue/throat swelling, SOB or lightheadedness with hypotension: unknown Has patient had a PCN reaction causing severe rash involving mucus membranes or skin necrosis: unknown Has patient had a PCN  reaction that required hospitalization No Has patient had a PCN reaction occurring within the last 10 years: No If all of the above answers are "NO", then may proceed with Cephalosporin use.  Other reaction(s): Rash-Generalized  . Tape Other (See Comments)    Skin turns red and burns    SOCIAL HISTORY/FAMILY HISTORY   Reviewed in Epic:  Pertinent findings: N/A;  Social History   Tobacco Use  . Smoking status: Current Every Day Smoker    Packs/day: 0.50    Years: 48.00    Pack years: 24.00    Types: Cigarettes  . Smokeless tobacco: Never Used  . Tobacco comment: 10/01/19 - 7 cigs/day  Vaping Use  . Vaping Use: Never used  Substance Use Topics  . Alcohol use: No  . Drug use: No  --> Not interested in smoking cessation  OBJCTIVE -PE, EKG, labs   Wt Readings from Last 3 Encounters:  03/25/20 171 lb (77.6 kg)  03/18/20 179 lb (81.2 kg)  01/28/20 166 lb (75.3 kg)    Physical Exam: BP (!) 112/50   Pulse 94   Ht 5\' 5"  (1.651 m)   Wt 179 lb (81.2 kg)   SpO2 93%   BMI 29.79 kg/m  Physical Exam Vitals reviewed.  Constitutional:      General: She is not in acute distress.    Appearance: She is ill-appearing (Chronically ill, wheelchair-bound.). She is not toxic-appearing.     Comments: Well-groomed, but pervasive cigarette odor.  Appears older than stated age.  HENT:     Head: Normocephalic and atraumatic.  Neck:     Vascular: Decreased carotid pulses (Bifid). No carotid bruit (Radiated  AS murmur), hepatojugular reflux or JVD.  Cardiovascular:     Rate and Rhythm: Normal rate and regular rhythm. Occasional extrasystoles are present.    Chest Wall: PMI is not displaced.     Pulses: Decreased pulses (Barely palpable pedal pulses both DP and PT).     Heart sounds: S1 normal and S2 normal. Heart sounds are distant. Murmur heard.  Harsh crescendo-decrescendo midsystolic murmur is present with a grade of 2/6 at the upper right sternal border radiating to the neck.   Low-pitched musical decrescendo presystolic murmur is present with a grade of 2/4 at the upper right sternal border and upper left sternal border.  No gallop.   Pulmonary:     Effort: No respiratory distress (Accessory muscle use, but nonlabored).     Breath sounds: Wheezing (Diffuse upper airways-end expiratory) and rhonchi (Heard throughout) present.  Chest:     Chest wall: Tenderness (Costochondral tenderness) present.  Musculoskeletal:     Cervical back: Normal range of motion.     Right lower leg: Edema (1-2+) present.     Left lower leg: Edema (1-2+) present.  Neurological:     General: No focal deficit present.     Mental Status: She is alert and oriented to person, place, and time.     Comments: Significant bilateral leg weakness, sitting wheelchair  Psychiatric:        Behavior: Behavior normal.        Thought Content: Thought content normal.        Judgment: Judgment normal.     Comments: She seems down/depressed now with new diagnosis of recurrent cancer.      Adult ECG Report Not checked  Recent Labs:   Lab Results  Component Value Date   CHOL 128 06/29/2018   HDL 35 (L) 06/29/2018   LDLCALC 56 06/29/2018   LDLDIRECT 187.0 07/14/2015   TRIG 184 (H) 06/29/2018   CHOLHDL 3.7 06/29/2018   Lab Results  Component Value Date   CREATININE 1.03 (H) 03/25/2020   BUN 10 03/25/2020   NA 139 03/25/2020   K 4.5 03/25/2020   CL 103 03/25/2020   CO2 30 03/25/2020   Lab Results  Component Value Date   TSH 1.84 01/01/2019    ASSESSMENT/PLAN    Problem List Items Addressed This Visit    Severe Stage C1 aortic regurgitation by prior echocardiography (Chronic)    Thankfully, EF has remained stable.  She has moderate to severe aortic insufficiency.  Not felt to be a surgical candidate for traditional AVR.  TAVR is still not a viable option given AI and not AS.  Thankfully, her symptoms of dyspnea seem to be more related to COPD than valvular disease.  Plan:    Continue current dose of Lasix with recommendation to take extra dose if necessary for edema.  Using diltiazem over beta-blocker for pulmonary pressure benefit as well as avoiding beta antagonist.  Recheck 2D echo in January -> if stable, we may want to back off on how frequently we check as she is clearly now even less of a surgical candidate.  She is on diltiazem and not ACE inhibitor or ARB for afterload reduction.  She is on Imdur.  Blood pressures have not been high enough to tolerate afterload reduction.      Relevant Medications   isosorbide mononitrate (IMDUR) 60 MG 24 hr tablet   nitroGLYCERIN (NITROSTAT) 0.4 MG SL tablet   rosuvastatin (CRESTOR) 20 MG tablet   Other Relevant Orders  ECHOCARDIOGRAM COMPLETE   Coronary artery disease involving native coronary artery of native heart with angina pectoris ([place]) - Primary (Chronic)    Hard to tell if she is having true angina.  She has episodes of chest pain that she says are nitro responsive, but it seems to take 5 to 10 minutes as opposed to 1 to 2 minutes to resolve.  Since she is using sublingual nitro we will increase her Imdur to 60 mg daily.  Continue diltiazem -> using the beta-blocker because of COPD and elevated pulmonary pressures.  She needs to restart statin, we will restart rosuvastatin  She is on standing dose of Plavix for maintenance--okay to hold 5-7 days preop for surgeries or procedures.      Relevant Medications   isosorbide mononitrate (IMDUR) 60 MG 24 hr tablet   nitroGLYCERIN (NITROSTAT) 0.4 MG SL tablet   rosuvastatin (CRESTOR) 20 MG tablet   Hyperlipidemia with target LDL less than 70 (Chronic)    Actually our Target LDL is actually less than 70.  She had tolerated rosuvastatin in the past, not sure why it was stopped.  We will restart 20 mg rosuvastatin and recheck labs at a minimum prior to next visit.      Relevant Medications   isosorbide mononitrate (IMDUR) 60 MG 24 hr tablet   nitroGLYCERIN  (NITROSTAT) 0.4 MG SL tablet   rosuvastatin (CRESTOR) 20 MG tablet   Chest pain (Chronic)    She really only had significant disease in the RCA treated with DES stent.  Otherwise mild to moderate disease. I suspect that her chest discomfort is musculoskeletal costochondritis type pain from coughing and increased work of breathing.  However, just to be sure, we will increase Imdur to 60 mg daily.      Essential hypertension, benign (Chronic)    Blood pressure is borderline low on diltiazem and Imdur.  Also on standing dose of Lasix.      Relevant Medications   isosorbide mononitrate (IMDUR) 60 MG 24 hr tablet   nitroGLYCERIN (NITROSTAT) 0.4 MG SL tablet   rosuvastatin (CRESTOR) 20 MG tablet   Tobacco abuse disorder (Chronic)    Not interested in quitting.  Not even interested in 1 minute of talk      Presence of drug coated stent in right coronary artery (Chronic)    Over a year and a half out now from PCI.     On maintenance dose Plavix and not aspirin.  Okay to hold 5 to 7 days preop for surgeries or procedures.         COVID-19 Education: The signs and symptoms of COVID-19 were discussed with the patient and how to seek care for testing (follow up with PCP or arrange E-visit).   The importance of social distancing and COVID-19 vaccination was discussed today. The patient is practicing social distancing & Masking.   I spent a total of 42minutes with the patient spent in direct patient consultation.  Additional time spent with chart review  / charting (studies, outside notes, etc): 25  Total Time: 51 min   Current medicines are reviewed at length with the patient today.  (+/- concerns) n/a  Notice: This dictation was prepared with Dragon dictation along with smaller phrase technology. Any transcriptional errors that result from this process are unintentional and may not be corrected upon review.  Patient Instructions / Medication Changes & Studies & Tests Ordered    Patient Instructions  Medication Instructions:  INCREASE YOUR IMDUR TO 60 MG DAILY.  RESTART ROSUVASTATIN 20 MG DAILY  *If you need a refill on your cardiac medications before your next appointment, please call your pharmacy*   Testing/Procedures: Your physician has requested that you have an echocardiogram. Echocardiography is a painless test that uses sound waves to create images of your heart. It provides your doctor with information about the size and shape of your heart and how well your heart's chambers and valves are working. This procedure takes approximately one hour. There are no restrictions for this procedure. THIS TEST IS DONE AT [facility] FOR January 2022.    Follow-Up: At [facility], you and your health needs are our priority.  As part of our continuing mission to provide you with exceptional heart care, we have created designated Provider Care Teams.  These Care Teams include your primary Cardiologist (physician) and Advanced Practice Providers (APPs -  Physician Assistants and Nurse Practitioners) who all work together to provide you with the care you need, when you need it.  We recommend signing up for the patient portal called "MyChart".  Sign up information is provided on this After Visit Summary.  MyChart is used to connect with patients for Virtual Visits (Telemedicine).  Patients are able to view lab/test results, encounter notes, upcoming appointments, etc.  Non-urgent messages can be sent to your provider as well.   To learn more about what you can do with MyChart, go to [email or web address].    Your next appointment:   6 month(s)  The format for your next appointment:   In Person  Provider:   You may see [name], MD or one of the following Advanced Practice Providers on your designated Care Team:    Rosaria Ferries, PA-C  Jory Sims, DNP, ANP  Studies Ordered:   Orders Placed This Encounter   Procedures  . ECHOCARDIOGRAM COMPLETE     [name], M.D., M.S. Interventional Cardiologist   Pager # [phone] Phone # [phone] [address] [place], [place] [address]   Thank you for choosing Heartcare at [facility]!!

## 2020-03-19 DIAGNOSIS — C3490 Malignant neoplasm of unspecified part of unspecified bronchus or lung: Secondary | ICD-10-CM | POA: Diagnosis not present

## 2020-03-25 ENCOUNTER — Inpatient Hospital Stay: Payer: Medicare HMO

## 2020-03-25 ENCOUNTER — Other Ambulatory Visit: Payer: Self-pay

## 2020-03-25 ENCOUNTER — Encounter: Payer: Self-pay | Admitting: Hematology & Oncology

## 2020-03-25 ENCOUNTER — Inpatient Hospital Stay: Payer: Medicare HMO | Attending: Hematology & Oncology | Admitting: Hematology & Oncology

## 2020-03-25 ENCOUNTER — Encounter: Payer: Self-pay | Admitting: Cardiology

## 2020-03-25 VITALS — BP 110/43 | HR 89 | Temp 98.5°F | Resp 18 | Wt 171.0 lb

## 2020-03-25 DIAGNOSIS — C50912 Malignant neoplasm of unspecified site of left female breast: Secondary | ICD-10-CM | POA: Diagnosis not present

## 2020-03-25 DIAGNOSIS — J449 Chronic obstructive pulmonary disease, unspecified: Secondary | ICD-10-CM | POA: Insufficient documentation

## 2020-03-25 DIAGNOSIS — Z87891 Personal history of nicotine dependence: Secondary | ICD-10-CM | POA: Insufficient documentation

## 2020-03-25 DIAGNOSIS — Z923 Personal history of irradiation: Secondary | ICD-10-CM | POA: Diagnosis not present

## 2020-03-25 DIAGNOSIS — C3490 Malignant neoplasm of unspecified part of unspecified bronchus or lung: Secondary | ICD-10-CM | POA: Diagnosis not present

## 2020-03-25 LAB — CBC WITH DIFFERENTIAL (CANCER CENTER ONLY)
Abs Immature Granulocytes: 0.02 10*3/uL (ref 0.00–0.07)
Basophils Absolute: 0.1 10*3/uL (ref 0.0–0.1)
Basophils Relative: 1 %
Eosinophils Absolute: 0.2 10*3/uL (ref 0.0–0.5)
Eosinophils Relative: 3 %
HCT: 36.4 % (ref 36.0–46.0)
Hemoglobin: 11.7 g/dL — ABNORMAL LOW (ref 12.0–15.0)
Immature Granulocytes: 0 %
Lymphocytes Relative: 16 %
Lymphs Abs: 1 10*3/uL (ref 0.7–4.0)
MCH: 32 pg (ref 26.0–34.0)
MCHC: 32.1 g/dL (ref 30.0–36.0)
MCV: 99.5 fL (ref 80.0–100.0)
Monocytes Absolute: 0.5 10*3/uL (ref 0.1–1.0)
Monocytes Relative: 8 %
Neutro Abs: 4.3 10*3/uL (ref 1.7–7.7)
Neutrophils Relative %: 72 %
Platelet Count: 284 10*3/uL (ref 150–400)
RBC: 3.66 MIL/uL — ABNORMAL LOW (ref 3.87–5.11)
RDW: 15.2 % (ref 11.5–15.5)
WBC Count: 6 10*3/uL (ref 4.0–10.5)
nRBC: 0 % (ref 0.0–0.2)

## 2020-03-25 LAB — CMP (CANCER CENTER ONLY)
ALT: 7 U/L (ref 0–44)
AST: 10 U/L — ABNORMAL LOW (ref 15–41)
Albumin: 3.8 g/dL (ref 3.5–5.0)
Alkaline Phosphatase: 94 U/L (ref 38–126)
Anion gap: 6 (ref 5–15)
BUN: 10 mg/dL (ref 8–23)
CO2: 30 mmol/L (ref 22–32)
Calcium: 9.5 mg/dL (ref 8.9–10.3)
Chloride: 103 mmol/L (ref 98–111)
Creatinine: 1.03 mg/dL — ABNORMAL HIGH (ref 0.44–1.00)
GFR, Est AFR Am: >60 mL/min (ref >60)
GFR, Estimated: 57 mL/min — ABNORMAL LOW (ref >60)
Glucose, Bld: 92 mg/dL (ref 70–99)
Potassium: 4.5 mmol/L (ref 3.5–5.1)
Sodium: 139 mmol/L (ref 135–145)
Total Bilirubin: 0.2 mg/dL — ABNORMAL LOW (ref 0.3–1.2)
Total Protein: 6.8 g/dL (ref 6.5–8.1)

## 2020-03-25 NOTE — Assessment & Plan Note (Signed)
Thankfully, EF has remained stable.  She has moderate to severe aortic insufficiency.  Not felt to be a surgical candidate for traditional AVR.  TAVR is still not a viable option given AI and not AS.  Thankfully, her symptoms of dyspnea seem to be more related to COPD than valvular disease.  Plan:   Continue current dose of Lasix with recommendation to take extra dose if necessary for edema.  Using diltiazem over beta-blocker for pulmonary pressure benefit as well as avoiding beta antagonist.  Recheck 2D echo in January -> if stable, we may want to back off on how frequently we check as she is clearly now even less of a surgical candidate.  She is on diltiazem and not ACE inhibitor or ARB for afterload reduction.  She is on Imdur.  Blood pressures have not been high enough to tolerate afterload reduction.

## 2020-03-25 NOTE — Assessment & Plan Note (Signed)
Over a year and a half out now from PCI.     On maintenance dose Plavix and not aspirin.  Okay to hold 5 to 7 days preop for surgeries or procedures.

## 2020-03-25 NOTE — Assessment & Plan Note (Addendum)
Actually our Target LDL is actually less than 70.  She had tolerated rosuvastatin in the past, not sure why it was stopped.  We will restart 20 mg rosuvastatin and recheck labs at a minimum prior to next visit.

## 2020-03-25 NOTE — Assessment & Plan Note (Signed)
Blood pressure is borderline low on diltiazem and Imdur.  Also on standing dose of Lasix.

## 2020-03-25 NOTE — Progress Notes (Signed)
Hematology and Oncology Follow Up Visit  Kaylee Hansen [id number] 06-26-1956 64 y.o. 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  As always, she comes in a wheelchair.  Her husband comes with her.  Thankfully, her PET scan that she had done last week showed that she still is responding to the radiosurgery that she had.  The PET scan showed that the tumor in the lingula now has an SUV of only 4.6.  It measures 1.3 x 1.2 cm.  A left upper lobe nodule is stable at 0.8 with a not elevated SUV.  There is a right upper lobe area that has an SUV of 3.7 which is not clear in etiology.  Again, she is limited by her COPD.  She does have nebulizer inhalers.  She is not sleeping all that well.  She wants to have a sleep aid.  I told her to try over-the-counter melatonin.  She has had a decent appetite.  She has had no nausea or vomiting.  She wants to try to have some portable oxygen.  I am not sure why her primary care doctor is not going to do this for her.  She does not have a pulmonologist.  I will see what we can do for her.    Overall, her performance status is ECOG 2.    Medications:  Current Outpatient Medications:  .  albuterol (PROVENTIL HFA;VENTOLIN HFA) 108 (90 Base) MCG/ACT inhaler, Inhale 2 puffs into the lungs every 6 (six) hours as needed for wheezing or shortness of breath., Disp: 1 Inhaler, Rfl: 5 .  albuterol (PROVENTIL) (2.5 MG/3ML) 0.083% nebulizer solution, Take 3 mLs (2.5 mg total) by nebulization every 4 (four) hours as needed for wheezing or shortness of breath., Disp: 150 mL, Rfl: 5 .  ALPRAZolam (XANAX) 0.25 MG tablet, Take 1 tablet (0.25 mg total) by mouth at bedtime as needed for anxiety., Disp: 90 tablet, Rfl: 1 .  bisacodyl (DULCOLAX) 5 MG EC tablet, Take 2 tablets (10 mg total) by mouth at bedtime.  (Patient taking differently: Take 10 mg by mouth daily as needed for moderate constipation. ), Disp: 30 tablet, Rfl:  .  clobetasol (TEMOVATE) 0.05 % external solution, Apply 1 application topically 2 (two) times daily. For itchy scalp lesions, Disp: 50 mL, Rfl: 3 .  clopidogrel (PLAVIX) 75 MG tablet, TAKE 1 TABLET (75 MG TOTAL) BY MOUTH DAILY WITH BREAKFAST. (Patient taking differently: Take 75 mg by mouth daily. ), Disp: 90 tablet, Rfl: 3 .  diltiazem (CARDIZEM CD) 120 MG 24 hr capsule, TAKE 1 CAPSULE BY MOUTH AT BEDTIME., Disp: 90 capsule, Rfl: 2 .  diphenhydrAMINE HCl, Sleep, (ZZZQUIL) 25 MG CAPS, Take 50 mg by mouth at bedtime as needed (sleep)., Disp: , Rfl:  .  furosemide (LASIX) 20 MG tablet, Take 1 tablet by mouth once daily, Disp: 90 tablet, Rfl: 2 .  guaiFENesin (MUCINEX) 600 MG 12 hr tablet, Take 1 tablet (600 mg total) by mouth 2 (two) times daily as needed for cough or to loosen phlegm., Disp: 14 tablet, Rfl: 0 .  HYDROcodone-acetaminophen (NORCO) 10-325 MG tablet, Take 1 tablet by mouth every 6 (six) hours as needed for severe pain. Please fill on or after 02/18/20, Disp: 120 tablet, Rfl: 0 .  HYDROcodone-acetaminophen (NORCO) 10-325 MG tablet, Take 1 tablet by  mouth every 6 (six) hours as needed for up to 5 days for severe pain., Disp: 120 tablet, Rfl: 0 .  HYDROcodone-acetaminophen (NORCO) 10-325 MG tablet, Take 1 tablet by mouth every 6 (six) hours as needed for up to 5 days for severe pain., Disp: 120 tablet, Rfl: 0 .  hydroxypropyl methylcellulose / hypromellose (ISOPTO TEARS / GONIOVISC) 2.5 % ophthalmic solution, Place 1 drop into both eyes 3 (three) times daily as needed for dry eyes., Disp: , Rfl:  .  isosorbide mononitrate (IMDUR) 60 MG 24 hr tablet, Take 1 tablet (60 mg total) by mouth daily., Disp: 90 tablet, Rfl: 3 .  ketoconazole (NIZORAL) 2 % cream, APPLY TO AFFECTED AREA EVERY DAY, Disp: 45 g, Rfl: 1 .  levothyroxine (SYNTHROID) 150 MCG tablet, Take 1 tablet (150 mcg total)  by mouth daily., Disp: 90 tablet, Rfl: 3 .  methylPREDNISolone (MEDROL DOSEPAK) 4 MG TBPK tablet, Take 6-5-4-3-2-1.  Start today, Disp: 21 tablet, Rfl: 3 .  nitroGLYCERIN (NITROSTAT) 0.4 MG SL tablet, DISSOLVE ONE TABLET UNDER THE TONGUE EVERY 5 MINUTES AS NEEDED FOR CHEST PAIN.  DO NOT EXCEED A TOTAL OF 3 DOSES IN 15 MINUTES, Disp: 25 tablet, Rfl: 6 .  OXYGEN, Inhale 2.5 L/min into the lungs at bedtime., Disp: , Rfl:  .  pantoprazole (PROTONIX) 40 MG tablet, TAKE 1 TABLET BY MOUTH DAILY BEFORE BREAKFAST (Patient taking differently: Take 40 mg by mouth daily. ), Disp: 90 tablet, Rfl: 3 .  PARoxetine (PAXIL) 40 MG tablet, Take 1 tablet (40 mg total) by mouth daily., Disp: 90 tablet, Rfl: 3 .  phenytoin (DILANTIN) 100 MG ER capsule, TAKE 100 MG EVERY MORNING AND 200 MG EVERY NIGHT., Disp: 270 capsule, Rfl: 3 .  promethazine (PHENERGAN) 25 MG tablet, Take 1 tablet (25 mg total) by mouth every 8 (eight) hours as needed for nausea or vomiting., Disp: 90 tablet, Rfl: 1 .  Respiratory Therapy Supplies (FLUTTER) DEVI, As directed, Disp: 1 each, Rfl: 0 .  rosuvastatin (CRESTOR) 20 MG tablet, Take 1 tablet (20 mg total) by mouth daily., Disp: 90 tablet, Rfl: 3 .  senna-docusate (SENOKOT S) 8.6-50 MG tablet, Take 2 tablets by mouth at bedtime., Disp: 60 tablet, Rfl: 11 .  SUMAtriptan (IMITREX) 100 MG tablet, May repeat in 2 hours if headache persists or recurs., Disp: 9 tablet, Rfl: 3 .  traZODone (DESYREL) 100 MG tablet, TAKE 1 TABLET BY MOUTH EVERYDAY AT BEDTIME (Patient taking differently: Take 100 mg by mouth at bedtime as needed for sleep. ), Disp: 90 tablet, Rfl: 3 .  Vitamin D, Ergocalciferol, (DRISDOL) 1.25 MG (50000 UNIT) CAPS capsule, Take 1 capsule (50,000 Units total) by mouth every Monday., Disp: 12 capsule, Rfl: 3  Allergies:  Allergies  Allergen Reactions  . Nitrofuran Derivatives Other (See Comments)    confusion  . Oxycodone Hcl Other (See Comments)    Knocked her out for 6 days  .  Morphine Other (See Comments)    "Makes me go crazy."   . Penicillins Hives    Has patient had a PCN reaction causing immediate rash, facial/tongue/throat swelling, SOB or lightheadedness with hypotension: unknown Has patient had a PCN reaction causing severe rash involving mucus membranes or skin necrosis: unknown Has patient had a PCN reaction that required hospitalization No Has patient had a PCN reaction occurring within the last 10 years: No If all of the above answers are "NO", then may proceed with Cephalosporin use.  Other reaction(s): Rash-Generalized  . Tape Other (See Comments)  Skin turns red and burns    Past Medical History, Surgical history, Social history, and Family History were reviewed and updated.  Review of Systems: Review of Systems  Constitutional: Positive for fatigue.  HENT:  Negative.   Eyes: Negative.   Respiratory: Positive for cough.   Cardiovascular: Positive for chest pain.  Gastrointestinal: Negative.   Endocrine: Negative.   Genitourinary: Negative.    Musculoskeletal: Positive for arthralgias and myalgias.  Skin: Negative.   Neurological: Negative.   Hematological: Negative.   Psychiatric/Behavioral: Negative.     Physical Exam:  vitals were not taken for this visit.   Wt Readings from Last 3 Encounters:  03/18/20 179 lb (81.2 kg)  01/28/20 166 lb (75.3 kg)  01/28/20 166 lb (75.3 kg)    Physical Exam Vitals reviewed.  HENT:     Head: Normocephalic and atraumatic.  Eyes:     Pupils: Pupils are equal, round, and reactive to light.  Cardiovascular:     Rate and Rhythm: Normal rate and regular rhythm.     Heart sounds: Normal heart sounds.     Comments: Cardiac exam shows a regular rate and rhythm.  She has no murmurs, rubs or bruits. Pulmonary:     Effort: Pulmonary effort is normal.     Breath sounds: Normal breath sounds.     Comments: Pulmonary exam shows scattered rhonchi bilaterally.  She has some wheezes.  She has a decent  air movement. Abdominal:     General: Bowel sounds are normal.     Palpations: Abdomen is soft.  Musculoskeletal:        General: No tenderness or deformity. Normal range of motion.     Cervical back: Normal range of motion.  Lymphadenopathy:     Cervical: No cervical adenopathy.  Skin:    General: Skin is warm and dry.     Findings: No erythema or rash.     Comments: Skin exam shows scattered ecchymoses.  Neurological:     Mental Status: She is alert and oriented to person, place, and time.  Psychiatric:        Behavior: Behavior normal.        Thought Content: Thought content normal.        Judgment: Judgment normal.      Lab Results  Component Value Date   WBC 6.0 03/25/2020   HGB 11.7 (L) 03/25/2020   HCT 36.4 03/25/2020   MCV 99.5 03/25/2020   PLT 284 03/25/2020     Chemistry      Component Value Date/Time   NA 139 03/25/2020 1154   NA 144 07/08/2018 1059   K 4.5 03/25/2020 1154   CL 103 03/25/2020 1154   CO2 30 03/25/2020 1154   BUN 10 03/25/2020 1154   BUN 12 07/08/2018 1059   CREATININE 1.03 (H) 03/25/2020 1154      Component Value Date/Time   CALCIUM 9.5 03/25/2020 1154   ALKPHOS 94 03/25/2020 1154   AST 10 (L) 03/25/2020 1154   ALT 7 03/25/2020 1154   BILITOT 0.2 (L) 03/25/2020 1154       Impression and Plan: Kaylee Hansen is a 64 year old white female.  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  I feel that she is responding to the SBRT.    At this point, I do not think we have to do another PET scan probably until next year.  We will get  her through the holidays.  Maybe we can get her some portable oxygen.  I am not sure we can get her the kind that she would like.  That probably is going be way too expensive for her insurance to cover.  I just feel bad that her quality of life is not that great and is all dependent upon her COPD.  [name],  MD 9/30/20211:12 PM

## 2020-03-25 NOTE — Assessment & Plan Note (Addendum)
Hard to tell if she is having true angina.  She has episodes of chest pain that she says are nitro responsive, but it seems to take 5 to 10 minutes as opposed to 1 to 2 minutes to resolve.  Since she is using sublingual nitro we will increase her Imdur to 60 mg daily.  Continue diltiazem -> using the beta-blocker because of COPD and elevated pulmonary pressures.  She needs to restart statin, we will restart rosuvastatin  She is on standing dose of Plavix for maintenance--okay to hold 5-7 days preop for surgeries or procedures.

## 2020-03-25 NOTE — Assessment & Plan Note (Signed)
She really only had significant disease in the RCA treated with DES stent.  Otherwise mild to moderate disease. I suspect that her chest discomfort is musculoskeletal costochondritis type pain from coughing and increased work of breathing.  However, just to be sure, we will increase Imdur to 60 mg daily.

## 2020-03-25 NOTE — Assessment & Plan Note (Signed)
Not interested in quitting.  Not even interested in 1 minute of talk

## 2020-03-26 NOTE — Progress Notes (Signed)
SATURATION QUALIFICATIONS: (This note is used to comply with regulatory documentation for home oxygen)  Patient Saturations on Room Air at Rest = 96%  Patient Saturations on Room Air while Ambulating = 88%  Patient Saturations on 2 Liters of oxygen while Ambulating = 93%  Please briefly explain why patient needs home oxygen: Pt unable to ambulate more than a few steps with out assistance and becoming short of breath. Pt requires wheelchair for ambulation requiring more than a few steps. Pt unable to transition from wheelchair to bed/chair/exam table with out shortness of breath.

## 2020-03-26 NOTE — Addendum Note (Signed)
Addended by: Lucile Crater on: 03/26/2020 10:43 AM   Modules accepted: Orders

## 2020-03-31 ENCOUNTER — Telehealth: Payer: Self-pay | Admitting: Cardiology

## 2020-03-31 NOTE — Telephone Encounter (Signed)
Spoke with pt  Re message and currently is having chest pain even after taking 3 ntg in the 15 min period time frame Encouraged pt to go to ED for eval and tx and pt refuses at this time Per pt last trip to ED waited for approx 10 hours does not want to do that again Once again encouraged pt to go to ED and continues to refuse Will forward message to Dr Ellyn Hack for review./cy

## 2020-03-31 NOTE — Telephone Encounter (Signed)
For now, she does not want to come in for evaluation, I will have her take a total of 120 mg of Imdur.  She is currently taking 90.  Probably would need to determine if we actually are going to consider evaluation with a cardiac catheterization or not going forward.  Otherwise, we will simply need to titrate medications.  Would would be best for her to be seen relatively soon.  [name], MD

## 2020-03-31 NOTE — Telephone Encounter (Signed)
    Pt c/o of Chest Pain: STAT if CP now or developed within 24 hours  1. Are you having CP right now? Yes  2. Are you experiencing any other symptoms (ex. SOB, nausea, vomiting, sweating)? SOB  3. How long have you been experiencing CP? 3 days ago  4. Is your CP continuous or coming and going? Coming and going   5. Have you taken Nitroglycerin? Yes  Pt said her CP started 3 days ago coming and going CP. She said she took up to 2 nitroglycerin at a time it helps a little but it doesn't relieved cp completely. She said she have CP right now and SOB ?

## 2020-04-01 MED ORDER — ISOSORBIDE MONONITRATE ER 120 MG PO TB24: 120.0000 mg | ORAL_TABLET | Freq: Every day | ORAL | 2 refills | Status: DC

## 2020-04-01 NOTE — Telephone Encounter (Signed)
Spoke to patient's husband , He states the patient is a sleep .  instruction given for the increase medication to 120 mg isosorbide mono.   Appointment schedule to 04/08/20  At 2:40 am.  RN informed husband Dr Ellyn Hack still recommend for patient to go to the ER if chest pain persist. Husband states he would take his wife to [facility] if needed.

## 2020-04-02 DIAGNOSIS — R69 Illness, unspecified: Secondary | ICD-10-CM | POA: Diagnosis not present

## 2020-04-07 ENCOUNTER — Other Ambulatory Visit: Payer: Self-pay | Admitting: *Deleted

## 2020-04-07 DIAGNOSIS — R531 Weakness: Secondary | ICD-10-CM

## 2020-04-08 ENCOUNTER — Encounter: Payer: Self-pay | Admitting: Cardiology

## 2020-04-08 ENCOUNTER — Ambulatory Visit: Payer: Medicare HMO | Admitting: Cardiology

## 2020-04-08 ENCOUNTER — Other Ambulatory Visit: Payer: Self-pay

## 2020-04-08 VITALS — BP 130/44 | HR 92 | Ht 65.0 in | Wt 169.6 lb

## 2020-04-08 DIAGNOSIS — Z955 Presence of coronary angioplasty implant and graft: Secondary | ICD-10-CM | POA: Diagnosis not present

## 2020-04-08 DIAGNOSIS — I351 Nonrheumatic aortic (valve) insufficiency: Secondary | ICD-10-CM | POA: Diagnosis not present

## 2020-04-08 DIAGNOSIS — E785 Hyperlipidemia, unspecified: Secondary | ICD-10-CM

## 2020-04-08 DIAGNOSIS — I10 Essential (primary) hypertension: Secondary | ICD-10-CM

## 2020-04-08 DIAGNOSIS — I25119 Atherosclerotic heart disease of native coronary artery with unspecified angina pectoris: Secondary | ICD-10-CM

## 2020-04-08 DIAGNOSIS — M940 Chondrocostal junction syndrome [Tietze]: Secondary | ICD-10-CM | POA: Diagnosis not present

## 2020-04-08 DIAGNOSIS — R072 Precordial pain: Secondary | ICD-10-CM

## 2020-04-08 MED ORDER — ISOSORBIDE MONONITRATE ER 60 MG PO TB24
60.0000 mg | ORAL_TABLET | Freq: Every day | ORAL | 3 refills | Status: DC
Start: 2020-04-08 — End: 2021-02-10

## 2020-04-08 MED ORDER — PREDNISONE 10 MG (21) PO TBPK
ORAL_TABLET | ORAL | 0 refills | Status: DC
Start: 2020-04-08 — End: 2020-05-27

## 2020-04-08 MED ORDER — DILTIAZEM HCL ER COATED BEADS 240 MG PO CP24
240.0000 mg | ORAL_CAPSULE | Freq: Every day | ORAL | 3 refills | Status: DC
Start: 2020-04-08 — End: 2020-09-16

## 2020-04-08 NOTE — Patient Instructions (Addendum)
Medication Instructions:   increase diltiazem to 240 mg  One tablet daily ( new prescription sent to pharmacy)  If you like to complete the bottle you have you can take 2 tablets of 120 mg  at same time daily until bottle is empty) then start on new medication.  Once you take the 240 mg dose of Diltiazem for a week (7 days)  then decease  Isosorbide mononitrate to 60 mg daily     take  steroid pak  Follow instructions  Take with food.    *If you need a refill on your cardiac medications before your next appointment, please call your pharmacy*   Lab Work: .not needed   Testing/Procedures: Not needed   Follow-Up: At [facility], you and your health needs are our priority.  As part of our continuing mission to provide you with exceptional heart care, we have created designated Provider Care Teams.  These Care Teams include your primary Cardiologist (physician) and Advanced Practice Providers (APPs -  Physician Assistants and Nurse Practitioners) who all work together to provide you with the care you need, when you need it.  We recommend signing up for the patient portal called "MyChart".  Sign up information is provided on this After Visit Summary.  MyChart is used to connect with patients for Virtual Visits (Telemedicine).  Patients are able to view lab/test results, encounter notes, upcoming appointments, etc.  Non-urgent messages can be sent to your provider as well.   To learn more about what you can do with MyChart, go to [email or web address].    Your next appointment:   5 month(s) Keep appointment in March 2022  The format for your next appointment:   In Person  Provider:   [name], MD   Other Instructions

## 2020-04-08 NOTE — Progress Notes (Signed)
Primary Care Provider: Cassandria Anger, MD Cardiologist: [name], MD Electrophysiologist: None  Oncologist: Dr. Marin Olp  Clinic Note: Chief Complaint  Patient presents with  . Follow-up    Chest pain  . Coronary Artery Disease    Having chest pain    HPI:    Kaylee Hansen is a 64 y.o. female with a PMH CAD (non-STEMI-RCA PCI), MODERATE-SEVERE AI (not felt to be open AVR candidate), long-term smoker with severe COPD and Lung Cancer (treated with chemo XRT--now with recurrence), essentially wheelchair-bound due to diffuse arthritis who presents today for close follow-up for reevaluation chest pain.  As of February 2021-she is relatively stable.  The mmHg dose of nitroglycerin 6 months.  Shortness of tachycardia nothing prolonged.  Mild edema treated with low-dose Lasix. She had seen CVTS (Dr. Roxy Manns) to discuss her aortic valve.  Not felt to be an open AVR candidate, and as of yet, aortic insufficiency is not an indication for TAVR. -> On Imdur plus diltiazem (as antianginals, and to avoid beta-blocker due to COPD)  Following this visit, She had multiple hospitalizations and procedures including esophageal dilation significant COPD exacerbation complicated by chest pain in the MRI.  She had severe hip pain in that setting as well. => She was then admitted a few weeks later to [facility] with initial diagnosis of pneumonia but chest CT showed recurrence of lung cancer in the left lingula.  She was seen by Dr. Marin Olp FFR but she was not reasonable for surgical candidate.  She has had 3 rounds of XRT with potential for chemotherapy.  Unfortunately despite this, she continues to smoke with no signs of being interested in quitting. -> In the interim with all these hospitalizations, multiple medications were stopped  [name] was last seen on September 23.  She was quite talkative but seemed defeated by the new diagnosis of recurrent cancer.  No change in her chronic  cough and chronic dyspnea.  Not tolerating CPAP.  Still noted off-and-on chest discomfort for which she takes medication.  I suspect that her chest pain was probably costochondritis related to her coughing and increased work of breathing.  However we increased Imdur to 60 mg, and restarting her rosuvastatin 20 mg.  2D echo ordered to reassess valve.   Contacted [place] back on October 6-episode of chest pain lasting with 10 minutes he took 3 nitroglycerin.  Did not want to go to emergency room.  At that time he increased her Imdur up to 120 mg.  Asked to return for follow-up.  Recent Hospitalizations: None  Reviewed  CV studies:    The following studies were reviewed today: (if available, images/films reviewed: From Epic Chart or Care Everywhere) None  Interval History:   Kaylee Hansen is here today with her husband.  Again in her wheelchair.  She seems more distressed today.  She has been noticing intermittent palpitations.  Still having chest pain simply while seated.  Often made worse by coughing. Still has chronic cough and dyspnea.  No fevers or chills.  Stable swelling in her legs.  Sleeps in a recliner.Not using extra oxygen except for at night and still not tolerating CPAP.   When asked about chest pain during transfer, she is says she notes symptoms she uses her arms to course of up, but not necessarily with shifting.  She seems extremely uncomfortable sitting in a chair.  Constantly shifting, medicated she is always hurting.  She remains wheelchair-bound and has lots of  aches and pains depending on how she sits.  She gets short of breath with minimal activity.  Despite all this, she still continues to smoke.  Cardiovascular ROS: positive for - chest pain, dyspnea on exertion, edema, irregular heartbeat, orthopnea, palpitations, shortness of breath and No no change from baseline. negative for - loss of consciousness, rapid heart rate or TIA or amaurosis fugax    REVIEWED OF  SYSTEMS   Review of Systems  Constitutional: Positive for malaise/fatigue. Negative for weight loss (Stable).  HENT: Positive for congestion. Negative for nosebleeds.   Respiratory: Positive for cough (Has proximal-isms of coughing, usually in the morning; but did recently have pneumonia), shortness of breath (Baseline, gets worse with any activity) and wheezing. Negative for sputum production (No longer productive).   Cardiovascular: Positive for chest pain and leg swelling.  Gastrointestinal: Positive for heartburn. Negative for abdominal pain, blood in stool (Although she occasionally has blood when wiping) and melena.  Genitourinary: Negative for hematuria.  Musculoskeletal: Positive for back pain, joint pain and myalgias. Negative for falls (None in the last month).  Neurological: Positive for dizziness and weakness (Global leg weakness). Negative for seizures and loss of consciousness.  Endo/Heme/Allergies: Bruises/bleeds easily.  Psychiatric/Behavioral: Positive for depression and memory loss. The patient is not nervous/anxious and does not have insomnia.    I have reviewed and (if needed) personally updated the patient's problem list, medications, allergies, past medical and surgical history, social and family history.   PAST MEDICAL HISTORY   Past Medical History:  Diagnosis Date  . Acute respiratory failure following trauma and surgery ([place]) 08/26/2012  . Anxiety   . Aortic insufficiency with aortic stenosis 04/2017   TTE December 2019: Mild AS with severe regurgitation.  Mild LA dilation.;;  TEE January 2016: Severe-type III aortic regurgitation (holodiastolic flow reversal in the a sending aorta & vena contracta =6.   . CAD S/P percutaneous coronary angioplasty 11/2017   Proximal RCA PCI Synergy DES 3.5 mm x 16 mm (3.8 mm). Ost-mLM 30%. Ost-prox Cx 40%.  . Collagenous colitis   . Colon adenomas 2011  . Constipation    Chronic abdominal pain and constipation  . COPD (chronic  obstructive pulmonary disease) ([place])   . Depression   . Diverticulosis   . Esophageal stricture 11/08/2012   Ulcer noted in 2010  . GERD (gastroesophageal reflux disease)   . Helicobacter pylori gastritis 2010   Pylera Tx  . History of cholecystectomy   . Hx of appendectomy   . Hx of cancer of lung 1999  . Hx of hysterectomy   . Hyperlipidemia   . Hypothyroidism   . Iron deficiency anemia due to chronic blood loss 10/01/2019  . Low back pain   . Non-STEMI (non-ST elevated myocardial infarction) ([place]) 11/2017   RCA PCI  . Osteoarthritis of knee    bilateral knee  . Seizures ([place])   . Stroke [facility])    CVA, hx of 97  . 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 ABDOMINAL HYSTERECTOMY     complete 1992  . ABDOMINAL SURGERY     Exploratory  . APPENDECTOMY    . BALLOON DILATION N/A 11/08/2012   Procedure: BALLOON DILATION;  Surgeon: Gatha Mayer, MD;  Location: [facility];  Service: Endoscopy;  Laterality: N/A;  . CHOLECYSTECTOMY    . COLONOSCOPY W/ BIOPSIES     multiple  . CORONARY STENT INTERVENTION N/A 06/28/2018  Procedure: CORONARY STENT INTERVENTION;  Surgeon: Martinique, Peter M, MD;  Location: [facility];  Service: Cardiovascular;  Laterality: N/A;  90% prox RCA -> PCI with Synergy DES 3.5 mm x 16 mm (3.8 mm).  . CT CTA CORONARY W/CA SCORE W/CM &/OR WO/CM  05/2017   Coronary calcium score 2.9. Intermediate risk. Unusual noncalcified plaque in RCA --FFR negative  . ESOPHAGOGASTRODUODENOSCOPY     w/baloon x 2  . ESOPHAGOGASTRODUODENOSCOPY N/A 11/08/2012   Procedure: ESOPHAGOGASTRODUODENOSCOPY (EGD);  Surgeon: Gatha Mayer, MD;  Location: [facility];  Service: Endoscopy;  Laterality: N/A;  . ESOPHAGOGASTRODUODENOSCOPY (EGD) WITH PROPOFOL N/A 08/25/2019   Procedure: ESOPHAGOGASTRODUODENOSCOPY (EGD) WITH PROPOFOL;  Surgeon: Gatha Mayer, MD;  Location: [facility];  Service: Endoscopy;  Laterality: N/A;   . LEFT HEART CATH AND CORONARY ANGIOGRAPHY N/A 06/28/2018   Procedure: LEFT HEART CATH AND CORONARY ANGIOGRAPHY;  Surgeon: Martinique, Peter M, MD;  Location: [facility];  Service: Cardiovascular:  90% prox RCA -> DES PCI. Ost-mLM 30%. Ost-prox Cx 40%.   EF 55-65%. LVEDP 15 mmHg.   [name] MALONEY DILATION  08/25/2019   Procedure: MALONEY DILATION;  Surgeon: Gatha Mayer, MD;  Location: [facility];  Service: Endoscopy;;  . RIGHT HEART CATH N/A 07/11/2018   Procedure: RIGHT HEART CATH;  Surgeon: Leonie Man, MD;  Location: [facility];;   Relatively normal Right Heart Cath pressures: PA pressure 26/14 mmHg-mean 20 mmHg; PCWP 13 mmHg.  RAP 6 mmHg, RVP 28/3 mmHg-EDP 10 mmHg. Severely reduced cardiac output and index by Fick: 3.63 and 2.07.  . TEE WITHOUT CARDIOVERSION N/A 07/11/2018   Procedure: TRANSESOPHAGEAL ECHOCARDIOGRAM (TEE);  Surgeon: [name], MD;  Location: [facility];  Service: Cardiology;;  Severe aortic regurgitation-type III. (suggested by holodiastolic flow reversal and descending aorta-vena contracta 6 mm).  . TOTAL HIP ARTHROPLASTY     Left  . TRANSTHORACIC ECHOCARDIOGRAM  05/2018   EF 60-65%.  No R WMA.  GR 1 DD.  Mild aortic stenosis with severe regurgitation.  Mild MR..  Only mild LV dilation noted.  . TRANSTHORACIC ECHOCARDIOGRAM  12/2018    EF 60-65%. Mild LVH. Gr1 DD. Mod AoV thickening. Mod-Severe AI, ~ mlid AS.      Immunization History  Administered Date(s) Administered  . Fluad Quad(high Dose 65+) 04/03/2019  . Influenza Split 03/27/2012  . Influenza Whole 05/08/2007, 04/08/2009, 03/24/2011  . Influenza, High Dose Seasonal PF 04/04/2017  . Influenza,inj,Quad PF,6+ Mos 03/05/2013, 05/01/2014, 03/05/2015, 04/17/2016, 03/13/2018  . Pneumococcal Conjugate-13 07/04/2016  . Pneumococcal Polysaccharide-23 07/14/2015  . Td 04/13/2016  . Tdap 07/14/2015    MEDICATIONS/ALLERGIES   Current Meds  Medication Sig  . albuterol (PROVENTIL) (2.5  MG/3ML) 0.083% nebulizer solution Take 3 mLs (2.5 mg total) by nebulization every 4 (four) hours as needed for wheezing or shortness of breath.  . ALPRAZolam (XANAX) 0.25 MG tablet Take 1 tablet (0.25 mg total) by mouth at bedtime as needed for anxiety.  . bisacodyl (DULCOLAX) 5 MG EC tablet Take 2 tablets (10 mg total) by mouth at bedtime. (Patient taking differently: Take 10 mg by mouth daily as needed for moderate constipation. )  . clobetasol (TEMOVATE) 0.05 % external solution Apply 1 application topically 2 (two) times daily. For itchy scalp lesions  . clopidogrel (PLAVIX) 75 MG tablet TAKE 1 TABLET (75 MG TOTAL) BY MOUTH DAILY WITH BREAKFAST. (Patient taking differently: Take 75 mg by mouth daily. )  . diphenhydrAMINE HCl, Sleep, (ZZZQUIL) 25 MG CAPS  Take 50 mg by mouth at bedtime as needed (sleep).  . furosemide (LASIX) 20 MG tablet Take 1 tablet by mouth once daily  . guaiFENesin (MUCINEX) 600 MG 12 hr tablet Take 1 tablet (600 mg total) by mouth 2 (two) times daily as needed for cough or to loosen phlegm.  [name] HYDROcodone-acetaminophen (NORCO) 10-325 MG tablet Take 1 tablet by mouth every 6 (six) hours as needed for severe pain. Please fill on or after 02/18/20  . hydroxypropyl methylcellulose / hypromellose (ISOPTO TEARS / GONIOVISC) 2.5 % ophthalmic solution Place 1 drop into both eyes 3 (three) times daily as needed for dry eyes.  [name] ketoconazole (NIZORAL) 2 % cream APPLY TO AFFECTED AREA EVERY DAY  . levothyroxine (SYNTHROID) 150 MCG tablet Take 1 tablet (150 mcg total) by mouth daily.  . nitroGLYCERIN (NITROSTAT) 0.4 MG SL tablet DISSOLVE ONE TABLET UNDER THE TONGUE EVERY 5 MINUTES AS NEEDED FOR CHEST PAIN.  DO NOT EXCEED A TOTAL OF 3 DOSES IN 15 MINUTES  . OXYGEN Inhale 2.5 L/min into the lungs at bedtime.  . pantoprazole (PROTONIX) 40 MG tablet TAKE 1 TABLET BY MOUTH DAILY BEFORE BREAKFAST (Patient taking differently: Take 40 mg by mouth daily. )  . PARoxetine (PAXIL) 40 MG tablet Take 1  tablet (40 mg total) by mouth daily.  . phenytoin (DILANTIN) 100 MG ER capsule TAKE 100 MG EVERY MORNING AND 200 MG EVERY NIGHT.  [name] promethazine (PHENERGAN) 25 MG tablet Take 1 tablet (25 mg total) by mouth every 8 (eight) hours as needed for nausea or vomiting.  [name] Respiratory Therapy Supplies (FLUTTER) DEVI As directed  . rosuvastatin (CRESTOR) 20 MG tablet Take 1 tablet (20 mg total) by mouth daily.  . SUMAtriptan (IMITREX) 100 MG tablet May repeat in 2 hours if headache persists or recurs.  . traZODone (DESYREL) 100 MG tablet TAKE 1 TABLET BY MOUTH EVERYDAY AT BEDTIME (Patient taking differently: Take 100 mg by mouth at bedtime as needed for sleep. )  . Vitamin D, Ergocalciferol, (DRISDOL) 1.25 MG (50000 UNIT) CAPS capsule Take 1 capsule (50,000 Units total) by mouth every Monday.  . [DISCONTINUED] diltiazem (CARDIZEM CD) 120 MG 24 hr capsule TAKE 1 CAPSULE BY MOUTH AT BEDTIME.  . [DISCONTINUED] isosorbide mononitrate (IMDUR) 120 MG 24 hr tablet Take 1 tablet (120 mg total) by mouth daily.    Allergies  Allergen Reactions  . Nitrofuran Derivatives Other (See Comments)    confusion  . Oxycodone Hcl Other (See Comments)    Knocked her out for 6 days  . Morphine Other (See Comments)    "Makes me go crazy."   . Penicillins Hives    Has patient had a PCN reaction causing immediate rash, facial/tongue/throat swelling, SOB or lightheadedness with hypotension: unknown Has patient had a PCN reaction causing severe rash involving mucus membranes or skin necrosis: unknown Has patient had a PCN reaction that required hospitalization No Has patient had a PCN reaction occurring within the last 10 years: No If all of the above answers are "NO", then may proceed with Cephalosporin use.  Other reaction(s): Rash-Generalized  . Tape Other (See Comments)    Skin turns red and burns    SOCIAL HISTORY/FAMILY HISTORY   Reviewed in Epic:  Pertinent findings: N/A;  Social History   Tobacco Use  .  Smoking status: Current Every Day Smoker    Packs/day: 0.50    Years: 48.00    Pack years: 24.00    Types: Cigarettes  . Smokeless tobacco: Never  Used  . Tobacco comment: 10/01/19 - 7 cigs/day  Vaping Use  . Vaping Use: Never used  Substance Use Topics  . Alcohol use: No  . Drug use: No  --> Not interested in smoking cessation  OBJCTIVE -PE, EKG, labs   Wt Readings from Last 3 Encounters:  04/08/20 169 lb 9.6 oz (76.9 kg)  03/25/20 171 lb (77.6 kg)  03/18/20 179 lb (81.2 kg)    Physical Exam: BP (!) 130/44   Pulse 92   Ht 5\' 5"  (1.651 m)   Wt 169 lb 9.6 oz (76.9 kg)   BMI 28.22 kg/m  Physical Exam Vitals reviewed.  Constitutional:      General: She is not in acute distress.    Appearance: She is normal weight. She is ill-appearing (Chronically ill, wheelchair-bound.). She is not toxic-appearing.     Comments: Pervasive cigarette odor.  Chronically ill-appearing appears older than stated age.  She is in a wheelchair.  Extremely uncomfortable.  HENT:     Head: Normocephalic and atraumatic.  Neck:     Vascular: Decreased carotid pulses (Bifid). No carotid bruit (Radiated AS murmur), hepatojugular reflux or JVD.  Cardiovascular:     Rate and Rhythm: Normal rate and regular rhythm. Occasional extrasystoles are present.    Chest Wall: PMI is not displaced.     Pulses: Decreased pulses (Barely palpable pedal pulses both DP and PT).     Heart sounds: S1 normal and S2 normal. Heart sounds are distant. Murmur heard.  Harsh crescendo-decrescendo midsystolic murmur is present with a grade of 2/6 at the upper right sternal border radiating to the neck.  Low-pitched musical decrescendo presystolic murmur is present with a grade of 2/4 at the upper right sternal border and upper left sternal border.  No gallop.   Pulmonary:     Effort: No respiratory distress (Accessory muscle use, but nonlabored).     Breath sounds: Wheezing (Diffuse upper airways-end expiratory) and rhonchi (Heard  throughout) present.     Comments: Diffuse coarse rhonchorous breath sounds but inspiratory and expiratory wheeze.  Raspy voice. Chest:     Chest wall: Tenderness (She has exquisite costochondral discomfort.  Most notable actually along the left parasternal line with the second and third ribs attach.  Also done along the lower ribs.  Very tender to palpation.  She jumps out of the chair.) present.  Musculoskeletal:     Cervical back: Normal range of motion.     Right lower leg: Edema (1-2+) present.     Left lower leg: Edema (1-2+) present.  Neurological:     General: No focal deficit present.     Mental Status: She is alert and oriented to person, place, and time.     Comments: Significant bilateral leg weakness, sitting wheelchair  Psychiatric:        Behavior: Behavior normal.        Thought Content: Thought content normal.        Judgment: Judgment normal.     Comments: Depressed mood     Adult ECG Report Sinus rhythm, 90 bpm.  RSR prime in pulmonary pattern.  Otherwise normal EKG.  Recent Labs:   Lab Results  Component Value Date   CHOL 128 06/29/2018   HDL 35 (L) 06/29/2018   LDLCALC 56 06/29/2018   LDLDIRECT 187.0 07/14/2015   TRIG 184 (H) 06/29/2018   CHOLHDL 3.7 06/29/2018   Lab Results  Component Value Date   CREATININE 1.03 (H) 03/25/2020   BUN 10 03/25/2020  NA 139 03/25/2020   K 4.5 03/25/2020   CL 103 03/25/2020   CO2 30 03/25/2020   Lab Results  Component Value Date   TSH 1.84 01/01/2019    ASSESSMENT/PLAN    Problem List Items Addressed This Visit    Severe Stage C1 aortic regurgitation by prior echocardiography (Chronic)    Echo ordered.  Has not yet been done.  Unfortunately, she would not be a good surgical option.  TAVR is not a good option given her aortic insufficiency.      Relevant Medications   diltiazem (CARDIZEM CD) 240 MG 24 hr capsule   isosorbide mononitrate (IMDUR) 60 MG 24 hr tablet   Coronary artery disease involving native  coronary artery of native heart with angina pectoris (HCC) (Chronic)    Pressure she probably does have some residual angina, however chest pain is clearly reproducible on exam.  At this point I think her pain is probably costochondral in nature.  Less likely to be anginal since there was no improvement by increasing her Imdur.  Plan: Reduce Imdur back to 60 mg daily and increase diltiazem to 240 mg daily Not on beta-blocker because of COPD. Monitoring of your ACE inhibitor or ARB mostly because her pressures have not been high. Continue statin  We will hold off on further ischemic evaluation. Continue maintenance dose Plavix for secondary prevention. Okay to hold Plavix 5 days preop for procedures or surgeries.      Relevant Medications   diltiazem (CARDIZEM CD) 240 MG 24 hr capsule   isosorbide mononitrate (IMDUR) 60 MG 24 hr tablet   Other Relevant Orders   EKG 12-Lead (Completed)   Hyperlipidemia with target LDL less than 70 (Chronic)    Has a bad intolerance listed, but she has taken rosuvastatin. Should have labs rechecked early next year.      Relevant Medications   diltiazem (CARDIZEM CD) 240 MG 24 hr capsule   isosorbide mononitrate (IMDUR) 60 MG 24 hr tablet   Chest pain (Chronic)    Reproducible on exam.  Likely costochondritis.  Will treat with steroid taper.      Relevant Orders   EKG 12-Lead (Completed)   Essential hypertension, benign (Chronic)    Pressure mostly low controlled.  Plan: Increase diltiazem to 240 mg daily due to resting heart rate 90 bpm.  Reduce Imdur to 60 mg. Continue same dose of Lasix.  We did discuss the use of additional dosing worsening edema.      Relevant Medications   diltiazem (CARDIZEM CD) 240 MG 24 hr capsule   isosorbide mononitrate (IMDUR) 60 MG 24 hr tablet   Presence of drug coated stent in right coronary artery (Chronic)   Relevant Orders   EKG 12-Lead (Completed)   Costochondritis - Primary    Easily reproducible  left-sided costochondral-costosternal tenderness palpation.  Easily reproducing her pain with exquisite tenderness.  Reluctant to use NSAID.  She seems done well with residents during hospitalizations.  Since she was having worsening cough, wheeze mostly steroid taper.  Plan: Pulse dose steroids.[name]         COVID-19 Education: The signs and symptoms of COVID-19 were discussed with the patient and how to seek care for testing (follow up with PCP or arrange E-visit).   The importance of social distancing and COVID-19 vaccination was discussed today. The patient is practicing social distancing & Masking.   I spent a total of 80minutes with the patient spent in direct patient consultation.  Additional time spent with  chart review  / charting (studies, outside notes, etc): 25  Total Time: 51 min   Current medicines are reviewed at length with the patient today.  (+/- concerns) n/a  Notice: This dictation was prepared with Dragon dictation along with smaller phrase technology. Any transcriptional errors that result from this process are unintentional and may not be corrected upon review.  Patient Instructions / Medication Changes & Studies & Tests Ordered   Patient Instructions  Medication Instructions:   increase diltiazem to 240 mg  One tablet daily ( new prescription sent to pharmacy)  If you like to complete the bottle you have you can take 2 tablets of 120 mg  at same time daily until bottle is empty) then start on new medication.  Once you take the 240 mg dose of Diltiazem for a week (7 days)  then decease  Isosorbide mononitrate to 60 mg daily     take  steroid pak  Follow instructions  Take with food.    *If you need a refill on your cardiac medications before your next appointment, please call your pharmacy*   Lab Work: .not needed   Testing/Procedures: Not needed   Follow-Up: At [facility], you and your health needs are our priority.  As part of our continuing  mission to provide you with exceptional heart care, we have created designated Provider Care Teams.  These Care Teams include your primary Cardiologist (physician) and Advanced Practice Providers (APPs -  Physician Assistants and Nurse Practitioners) who all work together to provide you with the care you need, when you need it.  We recommend signing up for the patient portal called "MyChart".  Sign up information is provided on this After Visit Summary.  MyChart is used to connect with patients for Virtual Visits (Telemedicine).  Patients are able to view lab/test results, encounter notes, upcoming appointments, etc.  Non-urgent messages can be sent to your provider as well.   To learn more about what you can do with MyChart, go to [email or web address].    Your next appointment:   5 month(s) Keep appointment in March 2022  The format for your next appointment:   In Person  Provider:   [name], MD   Other Instructions   Studies Ordered:   Orders Placed This Encounter  Procedures  . EKG 12-Lead     [name], M.D., M.S. Interventional Cardiologist   Pager # [phone] Phone # [phone] [address] [place], [place] [address]   Thank you for choosing Heartcare at [facility]!!

## 2020-04-09 DIAGNOSIS — Z993 Dependence on wheelchair: Secondary | ICD-10-CM | POA: Diagnosis not present

## 2020-04-09 DIAGNOSIS — I10 Essential (primary) hypertension: Secondary | ICD-10-CM | POA: Diagnosis not present

## 2020-04-09 DIAGNOSIS — Z7952 Long term (current) use of systemic steroids: Secondary | ICD-10-CM | POA: Diagnosis not present

## 2020-04-09 DIAGNOSIS — Z79891 Long term (current) use of opiate analgesic: Secondary | ICD-10-CM | POA: Diagnosis not present

## 2020-04-09 DIAGNOSIS — Z7902 Long term (current) use of antithrombotics/antiplatelets: Secondary | ICD-10-CM | POA: Diagnosis not present

## 2020-04-09 DIAGNOSIS — C3491 Malignant neoplasm of unspecified part of right bronchus or lung: Secondary | ICD-10-CM | POA: Diagnosis not present

## 2020-04-09 DIAGNOSIS — Z9981 Dependence on supplemental oxygen: Secondary | ICD-10-CM | POA: Diagnosis not present

## 2020-04-09 DIAGNOSIS — J449 Chronic obstructive pulmonary disease, unspecified: Secondary | ICD-10-CM | POA: Diagnosis not present

## 2020-04-09 DIAGNOSIS — Z87891 Personal history of nicotine dependence: Secondary | ICD-10-CM | POA: Diagnosis not present

## 2020-04-12 ENCOUNTER — Telehealth: Payer: Self-pay | Admitting: Cardiology

## 2020-04-12 NOTE — Telephone Encounter (Signed)
Spoke to patient she stated stopped taking Prednisone.She just started taking dose pack this past Saturday and is unable to take due to cannot sleep.Stated she is still sob.Advised I will send message to [place].

## 2020-04-12 NOTE — Telephone Encounter (Signed)
Pt c/o medication issue:  1. Name of Medication: predniSONE (STERAPRED UNI-PAK 21 TAB) 10 MG (21) TBPK tablet [[id number]]    2. How are you currently taking this medication (dosage and times per day)? predniSONE (STERAPRED UNI-PAK 21 TAB) 10 MG (21) TBPK  3. Are you having a reaction (difficulty breathing--STAT)? na  4. What is your medication issue? Pt called in and stated that she has not slept at all since she got up Saturday morning,  She stated since she has been taking the Predisone she is not able to sleep.  She would like to know if she can stop taking this med or what she needs to do   Best number  [phone]

## 2020-04-13 NOTE — Telephone Encounter (Signed)
Unfortunately, options for treating her are very limited.  I do not think the shortness of breath is related to her heart, more related to the lungs.  The purpose of the prednisone was to help her lungs but also to treat the chest pain that she is feeling.  I know it makes you feel strange, but for a short taper, it is reasonable to do.  I do not think that she is nave to steroids.  Taper should be 60 mg (6 tabs) x2 days, 40 mg (4 tabs) x2 days, 20 mg (2 tabs) x2 days.  If she can do this, is a short taper and as the dose decreases, so did the side effect symptoms.  Of course I cannot make her take the medications, this is the best option I can give her right now.  [name], MD

## 2020-04-14 ENCOUNTER — Telehealth: Payer: Self-pay | Admitting: Internal Medicine

## 2020-04-14 DIAGNOSIS — Z7902 Long term (current) use of antithrombotics/antiplatelets: Secondary | ICD-10-CM | POA: Diagnosis not present

## 2020-04-14 DIAGNOSIS — Z993 Dependence on wheelchair: Secondary | ICD-10-CM | POA: Diagnosis not present

## 2020-04-14 DIAGNOSIS — C3491 Malignant neoplasm of unspecified part of right bronchus or lung: Secondary | ICD-10-CM | POA: Diagnosis not present

## 2020-04-14 DIAGNOSIS — Z87891 Personal history of nicotine dependence: Secondary | ICD-10-CM | POA: Diagnosis not present

## 2020-04-14 DIAGNOSIS — J449 Chronic obstructive pulmonary disease, unspecified: Secondary | ICD-10-CM | POA: Diagnosis not present

## 2020-04-14 DIAGNOSIS — Z9981 Dependence on supplemental oxygen: Secondary | ICD-10-CM | POA: Diagnosis not present

## 2020-04-14 DIAGNOSIS — Z7952 Long term (current) use of systemic steroids: Secondary | ICD-10-CM | POA: Diagnosis not present

## 2020-04-14 DIAGNOSIS — I10 Essential (primary) hypertension: Secondary | ICD-10-CM | POA: Diagnosis not present

## 2020-04-14 DIAGNOSIS — Z79891 Long term (current) use of opiate analgesic: Secondary | ICD-10-CM | POA: Diagnosis not present

## 2020-04-14 NOTE — Telephone Encounter (Signed)
Kaylee Hansen with [facility] is requesting verbals for physical therapy 2w2, 1w2.   He can be reached at [phone]

## 2020-04-15 NOTE — Telephone Encounter (Signed)
Called patient no answer.[place].

## 2020-04-15 NOTE — Telephone Encounter (Signed)
verbal orders given

## 2020-04-15 NOTE — Telephone Encounter (Signed)
Ok Thx 

## 2020-04-16 DIAGNOSIS — Z9981 Dependence on supplemental oxygen: Secondary | ICD-10-CM | POA: Diagnosis not present

## 2020-04-16 DIAGNOSIS — Z7902 Long term (current) use of antithrombotics/antiplatelets: Secondary | ICD-10-CM | POA: Diagnosis not present

## 2020-04-16 DIAGNOSIS — C3491 Malignant neoplasm of unspecified part of right bronchus or lung: Secondary | ICD-10-CM | POA: Diagnosis not present

## 2020-04-16 DIAGNOSIS — Z79891 Long term (current) use of opiate analgesic: Secondary | ICD-10-CM | POA: Diagnosis not present

## 2020-04-16 DIAGNOSIS — Z7952 Long term (current) use of systemic steroids: Secondary | ICD-10-CM | POA: Diagnosis not present

## 2020-04-16 DIAGNOSIS — I10 Essential (primary) hypertension: Secondary | ICD-10-CM | POA: Diagnosis not present

## 2020-04-16 DIAGNOSIS — J449 Chronic obstructive pulmonary disease, unspecified: Secondary | ICD-10-CM | POA: Diagnosis not present

## 2020-04-16 DIAGNOSIS — Z993 Dependence on wheelchair: Secondary | ICD-10-CM | POA: Diagnosis not present

## 2020-04-16 DIAGNOSIS — Z87891 Personal history of nicotine dependence: Secondary | ICD-10-CM | POA: Diagnosis not present

## 2020-04-17 ENCOUNTER — Encounter: Payer: Self-pay | Admitting: Cardiology

## 2020-04-17 NOTE — Assessment & Plan Note (Signed)
Easily reproducible left-sided costochondral-costosternal tenderness palpation.  Easily reproducing her pain with exquisite tenderness.  Reluctant to use NSAID.  She seems done well with residents during hospitalizations.  Since she was having worsening cough, wheeze mostly steroid taper.  Plan: Pulse dose steroids.[name]

## 2020-04-17 NOTE — Assessment & Plan Note (Signed)
Reproducible on exam.  Likely costochondritis.  Will treat with steroid taper.

## 2020-04-17 NOTE — Assessment & Plan Note (Signed)
Pressure she probably does have some residual angina, however chest pain is clearly reproducible on exam.  At this point I think her pain is probably costochondral in nature.  Less likely to be anginal since there was no improvement by increasing her Imdur.  Plan: Reduce Imdur back to 60 mg daily and increase diltiazem to 240 mg daily Not on beta-blocker because of COPD. Monitoring of your ACE inhibitor or ARB mostly because her pressures have not been high. Continue statin  We will hold off on further ischemic evaluation. Continue maintenance dose Plavix for secondary prevention. Okay to hold Plavix 5 days preop for procedures or surgeries.

## 2020-04-17 NOTE — Assessment & Plan Note (Signed)
Pressure mostly low controlled.  Plan: Increase diltiazem to 240 mg daily due to resting heart rate 90 bpm.  Reduce Imdur to 60 mg. Continue same dose of Lasix.  We did discuss the use of additional dosing worsening edema.

## 2020-04-17 NOTE — Assessment & Plan Note (Signed)
Has a bad intolerance listed, but she has taken rosuvastatin. Should have labs rechecked early next year.

## 2020-04-17 NOTE — Assessment & Plan Note (Signed)
Echo ordered.  Has not yet been done.  Unfortunately, she would not be a good surgical option.  TAVR is not a good option given her aortic insufficiency.

## 2020-04-18 ENCOUNTER — Other Ambulatory Visit: Payer: Self-pay | Admitting: Internal Medicine

## 2020-04-18 DIAGNOSIS — C3490 Malignant neoplasm of unspecified part of unspecified bronchus or lung: Secondary | ICD-10-CM | POA: Diagnosis not present

## 2020-04-19 DIAGNOSIS — Z993 Dependence on wheelchair: Secondary | ICD-10-CM | POA: Diagnosis not present

## 2020-04-19 DIAGNOSIS — Z87891 Personal history of nicotine dependence: Secondary | ICD-10-CM | POA: Diagnosis not present

## 2020-04-19 DIAGNOSIS — J449 Chronic obstructive pulmonary disease, unspecified: Secondary | ICD-10-CM | POA: Diagnosis not present

## 2020-04-19 DIAGNOSIS — Z9981 Dependence on supplemental oxygen: Secondary | ICD-10-CM | POA: Diagnosis not present

## 2020-04-19 DIAGNOSIS — I10 Essential (primary) hypertension: Secondary | ICD-10-CM | POA: Diagnosis not present

## 2020-04-19 DIAGNOSIS — Z7902 Long term (current) use of antithrombotics/antiplatelets: Secondary | ICD-10-CM | POA: Diagnosis not present

## 2020-04-19 DIAGNOSIS — Z7952 Long term (current) use of systemic steroids: Secondary | ICD-10-CM | POA: Diagnosis not present

## 2020-04-19 DIAGNOSIS — C3491 Malignant neoplasm of unspecified part of right bronchus or lung: Secondary | ICD-10-CM | POA: Diagnosis not present

## 2020-04-19 DIAGNOSIS — Z79891 Long term (current) use of opiate analgesic: Secondary | ICD-10-CM | POA: Diagnosis not present

## 2020-04-22 DIAGNOSIS — Z993 Dependence on wheelchair: Secondary | ICD-10-CM | POA: Diagnosis not present

## 2020-04-22 DIAGNOSIS — C3491 Malignant neoplasm of unspecified part of right bronchus or lung: Secondary | ICD-10-CM | POA: Diagnosis not present

## 2020-04-22 DIAGNOSIS — Z87891 Personal history of nicotine dependence: Secondary | ICD-10-CM | POA: Diagnosis not present

## 2020-04-22 DIAGNOSIS — J449 Chronic obstructive pulmonary disease, unspecified: Secondary | ICD-10-CM | POA: Diagnosis not present

## 2020-04-22 DIAGNOSIS — Z7952 Long term (current) use of systemic steroids: Secondary | ICD-10-CM | POA: Diagnosis not present

## 2020-04-22 DIAGNOSIS — Z79891 Long term (current) use of opiate analgesic: Secondary | ICD-10-CM | POA: Diagnosis not present

## 2020-04-22 DIAGNOSIS — Z7902 Long term (current) use of antithrombotics/antiplatelets: Secondary | ICD-10-CM | POA: Diagnosis not present

## 2020-04-22 DIAGNOSIS — Z9981 Dependence on supplemental oxygen: Secondary | ICD-10-CM | POA: Diagnosis not present

## 2020-04-22 DIAGNOSIS — I10 Essential (primary) hypertension: Secondary | ICD-10-CM | POA: Diagnosis not present

## 2020-04-23 DIAGNOSIS — Z7952 Long term (current) use of systemic steroids: Secondary | ICD-10-CM | POA: Diagnosis not present

## 2020-04-23 DIAGNOSIS — J449 Chronic obstructive pulmonary disease, unspecified: Secondary | ICD-10-CM | POA: Diagnosis not present

## 2020-04-23 DIAGNOSIS — Z79891 Long term (current) use of opiate analgesic: Secondary | ICD-10-CM | POA: Diagnosis not present

## 2020-04-23 DIAGNOSIS — Z9981 Dependence on supplemental oxygen: Secondary | ICD-10-CM | POA: Diagnosis not present

## 2020-04-23 DIAGNOSIS — Z87891 Personal history of nicotine dependence: Secondary | ICD-10-CM | POA: Diagnosis not present

## 2020-04-23 DIAGNOSIS — C3491 Malignant neoplasm of unspecified part of right bronchus or lung: Secondary | ICD-10-CM | POA: Diagnosis not present

## 2020-04-23 DIAGNOSIS — Z7902 Long term (current) use of antithrombotics/antiplatelets: Secondary | ICD-10-CM | POA: Diagnosis not present

## 2020-04-23 DIAGNOSIS — I10 Essential (primary) hypertension: Secondary | ICD-10-CM | POA: Diagnosis not present

## 2020-04-23 DIAGNOSIS — Z993 Dependence on wheelchair: Secondary | ICD-10-CM | POA: Diagnosis not present

## 2020-04-26 ENCOUNTER — Other Ambulatory Visit: Payer: Self-pay | Admitting: Internal Medicine

## 2020-04-26 DIAGNOSIS — J449 Chronic obstructive pulmonary disease, unspecified: Secondary | ICD-10-CM | POA: Diagnosis not present

## 2020-04-26 DIAGNOSIS — Z7902 Long term (current) use of antithrombotics/antiplatelets: Secondary | ICD-10-CM | POA: Diagnosis not present

## 2020-04-26 DIAGNOSIS — Z79891 Long term (current) use of opiate analgesic: Secondary | ICD-10-CM | POA: Diagnosis not present

## 2020-04-26 DIAGNOSIS — R69 Illness, unspecified: Secondary | ICD-10-CM | POA: Diagnosis not present

## 2020-04-26 DIAGNOSIS — Z9981 Dependence on supplemental oxygen: Secondary | ICD-10-CM | POA: Diagnosis not present

## 2020-04-26 DIAGNOSIS — Z87891 Personal history of nicotine dependence: Secondary | ICD-10-CM | POA: Diagnosis not present

## 2020-04-26 DIAGNOSIS — Z7952 Long term (current) use of systemic steroids: Secondary | ICD-10-CM | POA: Diagnosis not present

## 2020-04-26 DIAGNOSIS — C3491 Malignant neoplasm of unspecified part of right bronchus or lung: Secondary | ICD-10-CM | POA: Diagnosis not present

## 2020-04-26 DIAGNOSIS — Z993 Dependence on wheelchair: Secondary | ICD-10-CM | POA: Diagnosis not present

## 2020-04-26 DIAGNOSIS — I10 Essential (primary) hypertension: Secondary | ICD-10-CM | POA: Diagnosis not present

## 2020-04-28 DIAGNOSIS — J449 Chronic obstructive pulmonary disease, unspecified: Secondary | ICD-10-CM | POA: Diagnosis not present

## 2020-04-28 DIAGNOSIS — Z7952 Long term (current) use of systemic steroids: Secondary | ICD-10-CM | POA: Diagnosis not present

## 2020-04-28 DIAGNOSIS — Z9981 Dependence on supplemental oxygen: Secondary | ICD-10-CM | POA: Diagnosis not present

## 2020-04-28 DIAGNOSIS — Z79891 Long term (current) use of opiate analgesic: Secondary | ICD-10-CM | POA: Diagnosis not present

## 2020-04-28 DIAGNOSIS — Z993 Dependence on wheelchair: Secondary | ICD-10-CM | POA: Diagnosis not present

## 2020-04-28 DIAGNOSIS — I10 Essential (primary) hypertension: Secondary | ICD-10-CM | POA: Diagnosis not present

## 2020-04-28 DIAGNOSIS — C3491 Malignant neoplasm of unspecified part of right bronchus or lung: Secondary | ICD-10-CM | POA: Diagnosis not present

## 2020-04-28 DIAGNOSIS — Z7902 Long term (current) use of antithrombotics/antiplatelets: Secondary | ICD-10-CM | POA: Diagnosis not present

## 2020-04-28 DIAGNOSIS — Z87891 Personal history of nicotine dependence: Secondary | ICD-10-CM | POA: Diagnosis not present

## 2020-04-29 ENCOUNTER — Encounter: Payer: Self-pay | Admitting: Internal Medicine

## 2020-04-29 ENCOUNTER — Other Ambulatory Visit: Payer: Self-pay

## 2020-04-29 ENCOUNTER — Ambulatory Visit (INDEPENDENT_AMBULATORY_CARE_PROVIDER_SITE_OTHER): Payer: Medicare HMO | Admitting: Internal Medicine

## 2020-04-29 VITALS — BP 130/58 | HR 91 | Temp 98.4°F

## 2020-04-29 DIAGNOSIS — C3491 Malignant neoplasm of unspecified part of right bronchus or lung: Secondary | ICD-10-CM | POA: Diagnosis not present

## 2020-04-29 DIAGNOSIS — Z7952 Long term (current) use of systemic steroids: Secondary | ICD-10-CM | POA: Diagnosis not present

## 2020-04-29 DIAGNOSIS — J449 Chronic obstructive pulmonary disease, unspecified: Secondary | ICD-10-CM | POA: Diagnosis not present

## 2020-04-29 DIAGNOSIS — Z23 Encounter for immunization: Secondary | ICD-10-CM | POA: Diagnosis not present

## 2020-04-29 DIAGNOSIS — G8929 Other chronic pain: Secondary | ICD-10-CM | POA: Diagnosis not present

## 2020-04-29 DIAGNOSIS — J441 Chronic obstructive pulmonary disease with (acute) exacerbation: Secondary | ICD-10-CM

## 2020-04-29 DIAGNOSIS — M5440 Lumbago with sciatica, unspecified side: Secondary | ICD-10-CM

## 2020-04-29 DIAGNOSIS — K439 Ventral hernia without obstruction or gangrene: Secondary | ICD-10-CM | POA: Diagnosis not present

## 2020-04-29 DIAGNOSIS — Z79891 Long term (current) use of opiate analgesic: Secondary | ICD-10-CM | POA: Diagnosis not present

## 2020-04-29 DIAGNOSIS — F411 Generalized anxiety disorder: Secondary | ICD-10-CM

## 2020-04-29 DIAGNOSIS — R296 Repeated falls: Secondary | ICD-10-CM

## 2020-04-29 DIAGNOSIS — I10 Essential (primary) hypertension: Secondary | ICD-10-CM | POA: Diagnosis not present

## 2020-04-29 DIAGNOSIS — R69 Illness, unspecified: Secondary | ICD-10-CM | POA: Diagnosis not present

## 2020-04-29 DIAGNOSIS — R269 Unspecified abnormalities of gait and mobility: Secondary | ICD-10-CM

## 2020-04-29 DIAGNOSIS — Z9981 Dependence on supplemental oxygen: Secondary | ICD-10-CM | POA: Diagnosis not present

## 2020-04-29 DIAGNOSIS — Z7902 Long term (current) use of antithrombotics/antiplatelets: Secondary | ICD-10-CM | POA: Diagnosis not present

## 2020-04-29 DIAGNOSIS — Z87891 Personal history of nicotine dependence: Secondary | ICD-10-CM | POA: Diagnosis not present

## 2020-04-29 DIAGNOSIS — Z993 Dependence on wheelchair: Secondary | ICD-10-CM | POA: Diagnosis not present

## 2020-04-29 MED ORDER — ALPRAZOLAM 0.25 MG PO TABS: 0.2500 mg | ORAL_TABLET | Freq: Every evening | ORAL | 1 refills | Status: DC | PRN

## 2020-04-29 MED ORDER — METHYLPREDNISOLONE 4 MG PO TBPK
ORAL_TABLET | ORAL | 0 refills | Status: DC
Start: 2020-04-29 — End: 2020-05-27

## 2020-04-29 MED ORDER — DOXYCYCLINE HYCLATE 100 MG PO TABS: 100.0000 mg | ORAL_TABLET | Freq: Two times a day (BID) | ORAL | 0 refills | Status: DC

## 2020-04-29 MED ORDER — METHYLPREDNISOLONE ACETATE 80 MG/ML IJ SUSP
80.0000 mg | Freq: Once | INTRAMUSCULAR | Status: AC
  Administered 2020-04-29: 80 mg via INTRAMUSCULAR

## 2020-04-29 MED ORDER — HYDROCODONE-ACETAMINOPHEN 10-325 MG PO TABS: 1.0000 | ORAL_TABLET | Freq: Four times a day (QID) | ORAL | 0 refills | Status: DC | PRN

## 2020-04-29 MED ORDER — DOXYCYCLINE HYCLATE 100 MG PO TABS
100.0000 mg | ORAL_TABLET | Freq: Two times a day (BID) | ORAL | 0 refills | Status: DC
Start: 2020-04-29 — End: 2020-05-27

## 2020-04-29 NOTE — Assessment & Plan Note (Signed)
Falls -risks discussed

## 2020-04-29 NOTE — Assessment & Plan Note (Signed)
Discussed The pt fell at home this am too

## 2020-04-29 NOTE — Assessment & Plan Note (Addendum)
Midline 5 cm hernia, NT discussed

## 2020-04-29 NOTE — Progress Notes (Signed)
Subjective:  Patient ID: Kaylee Hansen, female    DOB: [birth year]  Age: 64 y.o. MRN: [id number]  CC: Follow-up (3 MONTH F/U- Pt fell in the bathroom. she states her (L) arm hit the bar want MD to check it out)   HPI Kaylee Hansen presents for COPD, lung cancer, anxiety, chronic pain The pt was reaching for a bar in the  lobby bathroom, missed it and fell; she hit her head on the L side. No LOC. C/o L arm pain   Outpatient Medications Prior to Visit  Medication Sig Dispense Refill  . albuterol (PROVENTIL) (2.5 MG/3ML) 0.083% nebulizer solution INHALE 1 VIAL BY NEBULIZATION EVERY 4 HOURS AS NEEDED FOR WHEEZING OR SHORTNESS OF BREATH. 150 mL 5  . ALPRAZolam (XANAX) 0.25 MG tablet Take 1 tablet (0.25 mg total) by mouth at bedtime as needed for anxiety. 90 tablet 1  . bisacodyl (DULCOLAX) 5 MG EC tablet Take 2 tablets (10 mg total) by mouth at bedtime. (Patient taking differently: Take 10 mg by mouth daily as needed for moderate constipation. ) 30 tablet   . clobetasol (TEMOVATE) 0.05 % external solution Apply 1 application topically 2 (two) times daily. For itchy scalp lesions 50 mL 3  . clopidogrel (PLAVIX) 75 MG tablet TAKE 1 TABLET (75 MG TOTAL) BY MOUTH DAILY WITH BREAKFAST. (Patient taking differently: Take 75 mg by mouth daily. ) 90 tablet 3  . diltiazem (CARDIZEM CD) 240 MG 24 hr capsule Take 1 capsule (240 mg total) by mouth daily. 90 capsule 3  . diphenhydrAMINE HCl, Sleep, (ZZZQUIL) 25 MG CAPS Take 50 mg by mouth at bedtime as needed (sleep).    . furosemide (LASIX) 20 MG tablet Take 1 tablet by mouth once daily 90 tablet 2  . guaiFENesin (MUCINEX) 600 MG 12 hr tablet Take 1 tablet (600 mg total) by mouth 2 (two) times daily as needed for cough or to loosen phlegm. 14 tablet 0  . HYDROcodone-acetaminophen (NORCO) 10-325 MG tablet Take 1 tablet by mouth every 6 (six) hours as needed for severe pain. Please fill on or after 02/18/20 120 tablet 0  . hydroxypropyl methylcellulose /  hypromellose (ISOPTO TEARS / GONIOVISC) 2.5 % ophthalmic solution Place 1 drop into both eyes 3 (three) times daily as needed for dry eyes.    . isosorbide mononitrate (IMDUR) 60 MG 24 hr tablet Take 1 tablet (60 mg total) by mouth daily. 90 tablet 3  . ketoconazole (NIZORAL) 2 % cream APPLY TO AFFECTED AREA EVERY DAY 45 g 1  . levothyroxine (SYNTHROID) 150 MCG tablet Take 1 tablet (150 mcg total) by mouth daily. 90 tablet 3  . nitroGLYCERIN (NITROSTAT) 0.4 MG SL tablet DISSOLVE ONE TABLET UNDER THE TONGUE EVERY 5 MINUTES AS NEEDED FOR CHEST PAIN.  DO NOT EXCEED A TOTAL OF 3 DOSES IN 15 MINUTES 25 tablet 6  . OXYGEN Inhale 2.5 L/min into the lungs at bedtime.    . pantoprazole (PROTONIX) 40 MG tablet TAKE 1 TABLET BY MOUTH DAILY BEFORE BREAKFAST (Patient taking differently: Take 40 mg by mouth daily. ) 90 tablet 3  . PARoxetine (PAXIL) 40 MG tablet Take 1 tablet (40 mg total) by mouth daily. 90 tablet 3  . phenytoin (DILANTIN) 100 MG ER capsule TAKE 100 MG EVERY MORNING AND 200 MG EVERY NIGHT. 270 capsule 3  . predniSONE (STERAPRED UNI-PAK 21 TAB) 10 MG (21) TBPK tablet Follow direction on package 21 tablet 0  . promethazine (PHENERGAN) 25 MG tablet Take 1  tablet (25 mg total) by mouth every 8 (eight) hours as needed for nausea or vomiting. 90 tablet 1  . Respiratory Therapy Supplies (FLUTTER) DEVI As directed 1 each 0  . rosuvastatin (CRESTOR) 20 MG tablet Take 1 tablet (20 mg total) by mouth daily. 90 tablet 3  . SUMAtriptan (IMITREX) 100 MG tablet May repeat in 2 hours if headache persists or recurs. 9 tablet 3  . traZODone (DESYREL) 100 MG tablet TAKE 1 TABLET BY MOUTH EVERYDAY AT BEDTIME 90 tablet 3  . albuterol (PROVENTIL HFA;VENTOLIN HFA) 108 (90 Base) MCG/ACT inhaler Inhale 2 puffs into the lungs every 6 (six) hours as needed for wheezing or shortness of breath. 1 Inhaler 5  . Vitamin D, Ergocalciferol, (DRISDOL) 1.25 MG (50000 UNIT) CAPS capsule Take 1 capsule (50,000 Units total) by mouth  every Monday. (Patient not taking: Reported on 04/29/2020) 12 capsule 3   No facility-administered medications prior to visit.    ROS: Review of Systems  Constitutional: Positive for fatigue. Negative for activity change, appetite change, chills and unexpected weight change.  HENT: Negative for congestion, mouth sores and sinus pressure.   Eyes: Negative for visual disturbance.  Respiratory: Positive for cough, shortness of breath and wheezing. Negative for chest tightness.   Gastrointestinal: Negative for abdominal pain and nausea.  Genitourinary: Negative for difficulty urinating, frequency, pelvic pain and vaginal pain.  Musculoskeletal: Positive for arthralgias, back pain, gait problem and neck stiffness. Negative for neck pain.  Skin: Negative for pallor and rash.  Neurological: Positive for dizziness, weakness and light-headedness. Negative for tremors, numbness and headaches.  Hematological: Bruises/bleeds easily.  Psychiatric/Behavioral: Negative for confusion and sleep disturbance.    Objective:  BP (!) 130/58 (BP Location: Left Arm)   Pulse 91   Temp 98.4 F (36.9 C) (Oral)   SpO2 96%   BP Readings from Last 3 Encounters:  04/29/20 (!) 130/58  04/08/20 (!) 130/44  03/25/20 (!) 110/43    Wt Readings from Last 3 Encounters:  04/08/20 169 lb 9.6 oz (76.9 kg)  03/25/20 171 lb (77.6 kg)  03/18/20 179 lb (81.2 kg)    Physical Exam Constitutional:      General: She is not in acute distress.    Appearance: She is well-developed. She is obese. She is ill-appearing. She is not toxic-appearing.  HENT:     Head: Normocephalic.     Right Ear: External ear normal.     Left Ear: External ear normal.     Nose: Nose normal.  Eyes:     General:        Right eye: No discharge.        Left eye: No discharge.     Conjunctiva/sclera: Conjunctivae normal.     Pupils: Pupils are equal, round, and reactive to light.  Neck:     Thyroid: No thyromegaly.     Vascular: No JVD.      Trachea: No tracheal deviation.  Cardiovascular:     Rate and Rhythm: Regular rhythm. Tachycardia present.     Heart sounds: Normal heart sounds. No gallop.   Pulmonary:     Effort: No respiratory distress.     Breath sounds: No stridor. Wheezing and rhonchi present.  Chest:     Chest wall: No tenderness.  Abdominal:     General: Bowel sounds are normal. There is no distension.     Palpations: Abdomen is soft. There is no mass.     Tenderness: There is no abdominal tenderness. There is no  guarding or rebound.  Musculoskeletal:        General: Tenderness present.     Cervical back: Normal range of motion and neck supple.  Lymphadenopathy:     Cervical: No cervical adenopathy.  Skin:    Findings: No erythema or rash.  Neurological:     Mental Status: She is oriented to person, place, and time.     Cranial Nerves: No cranial nerve deficit.     Motor: No abnormal muscle tone.     Coordination: Coordination abnormal.     Gait: Gait abnormal.     Deep Tendon Reflexes: Reflexes normal.  Psychiatric:        Behavior: Behavior normal.        Thought Content: Thought content normal.        Judgment: Judgment normal.   in a w/c Head NT L arm NT LS spine - tender Midline 5 cm hernia, NT    A total time of >45 minutes was spent preparing to see the patient, reviewing tests, x-rays, operative reports and outside records.  Also, obtaining history and performing comprehensive physical exam. Falls discussed.  Additionally, counseling the patient regarding the above listed issues.   Finally, documenting clinical information in the health records, coordination of care, educating the patient. It is a complex case.     Lab Results  Component Value Date   WBC 6.0 03/25/2020   HGB 11.7 (L) 03/25/2020   HCT 36.4 03/25/2020   PLT 284 03/25/2020   GLUCOSE 92 03/25/2020   CHOL 128 06/29/2018   TRIG 184 (H) 06/29/2018   HDL 35 (L) 06/29/2018   LDLDIRECT 187.0 07/14/2015   LDLCALC 56  06/29/2018   ALT 7 03/25/2020   AST 10 (L) 03/25/2020   NA 139 03/25/2020   K 4.5 03/25/2020   CL 103 03/25/2020   CREATININE 1.03 (H) 03/25/2020   BUN 10 03/25/2020   CO2 30 03/25/2020   TSH 1.84 01/01/2019   INR 0.9 09/22/2019   HGBA1C  09/17/2008    5.0 (NOTE)   The ADA recommends the following therapeutic goal for glycemic   control related to Hgb A1C measurement:   Goal of Therapy:   < 7.0% Hgb A1C   Reference: American Diabetes Association: Clinical Practice   Recommendations 2008, Diabetes Care,  2008, 31:(Suppl 1).    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 EXAM: NUCLEAR MEDICINE PET SKULL BASE TO THIGH TECHNIQUE: 8.7 mCi F-18 FDG was injected intravenously. Full-ring PET imaging was performed from the skull base to thigh after the radiotracer. CT data was obtained and used for attenuation correction and anatomic localization. Fasting blood glucose: 111 mg/dl COMPARISON:  Multiple exams, including 01/21/2020 FINDINGS: Mediastinal blood pool activity: SUV max 3.1 Liver activity: SUV max N/A NECK: No significant abnormal hypermetabolic activity in this region. Incidental CT findings: Bilateral common carotid atherosclerotic calcification. CHEST: Increased volume loss and/or consolidation in the lingula near the previously hypermetabolic pulmonary nodule. The nodule currently measures about 1.3 by 1.2 cm on image 72/4 (formerly approximately the same) and has a maximum SUV of 4.6 (formerly 7.9). A left upper lobe nodule posteriorly on image 50 of series 4 measures about 0.8 cm in diameter, stable by my measurements, maximum SUV in this vicinity is 1.1 (no significant accentuated metabolic activity). Low-grade adenocarcinoma is not totally excluded and surveillance is likely warranted.  Post therapy related findings in the right upper lobe medially with  some local consolidation and air bronchograms, maximum SUV in this vicinity 3.7 (formerly 3.2). No obvious internal focus of higher activity within this region of local consolidation. Incidental CT findings: Coronary, aortic arch, and branch vessel atherosclerotic vascular disease. Centrilobular emphysema. New trace left pleural effusion. Mild atelectasis along the left inferior pulmonary ligament. ABDOMEN/PELVIS: No significant abnormal hypermetabolic activity in this region. Incidental CT findings: Aortoiliac atherosclerotic vascular disease. Cholecystectomy. Photopenic left renal cysts. SKELETON: No significant abnormal hypermetabolic activity in this region. Incidental CT findings: Left hip prosthesis. Old bilateral rib fracture deformities. Postoperative findings on the right at L4-5. Chronic T2 compression fracture. Old right medial clavicular fracture IMPRESSION: 1. Further reduction in activity of the left upper lobe nodule, current maximum SUV 4.6 (previously 7.9). There is some increase in surrounding atelectasis/consolidation possibly related to prior SBRT. 2. Post therapy related findings in the right upper lobe medially, similar to prior. 3. Stable 0.8 cm left upper lobe nodule posteriorly with maximum SUV of only 1.1, nonspecific but low-grade adenocarcinoma not excluded and surveillance recommended. 4. New trace left pleural effusion. 5. Other imaging findings of potential clinical significance: Aortic Atherosclerosis (ICD10-I70.0) and Emphysema (ICD10-J43.9). Coronary atherosclerosis. Electronically Signed   By: Van Clines M.D.   On: 03/17/2020 11:05    Assessment & Plan:     Follow-up: No follow-ups on file.  Walker Kehr, MD

## 2020-04-29 NOTE — Assessment & Plan Note (Signed)
Worse Depo-medrol Dosepac Medrol Doxy

## 2020-04-29 NOTE — Assessment & Plan Note (Signed)
Xanax prn  Potential benefits of a long term benzodiazepines  use as well as potential risks  and complications were explained to the patient and were aknowledged. 

## 2020-04-29 NOTE — Assessment & Plan Note (Signed)
Norco  Potential benefits of a long term opioids use as well as potential risks (i.e. addiction risk, apnea etc) and complications (i.e. Somnolence, constipation and others) were explained to the patient and were aknowledged. Narcane nasal prn

## 2020-05-02 DIAGNOSIS — S2232XA Fracture of one rib, left side, initial encounter for closed fracture: Secondary | ICD-10-CM | POA: Diagnosis not present

## 2020-05-02 DIAGNOSIS — R69 Illness, unspecified: Secondary | ICD-10-CM | POA: Diagnosis not present

## 2020-05-02 DIAGNOSIS — Z7902 Long term (current) use of antithrombotics/antiplatelets: Secondary | ICD-10-CM | POA: Diagnosis not present

## 2020-05-02 DIAGNOSIS — M47816 Spondylosis without myelopathy or radiculopathy, lumbar region: Secondary | ICD-10-CM | POA: Diagnosis not present

## 2020-05-02 DIAGNOSIS — S79912A Unspecified injury of left hip, initial encounter: Secondary | ICD-10-CM | POA: Diagnosis not present

## 2020-05-02 DIAGNOSIS — M25512 Pain in left shoulder: Secondary | ICD-10-CM | POA: Diagnosis not present

## 2020-05-02 DIAGNOSIS — J449 Chronic obstructive pulmonary disease, unspecified: Secondary | ICD-10-CM | POA: Diagnosis not present

## 2020-05-02 DIAGNOSIS — M19012 Primary osteoarthritis, left shoulder: Secondary | ICD-10-CM | POA: Diagnosis not present

## 2020-05-02 DIAGNOSIS — J440 Chronic obstructive pulmonary disease with acute lower respiratory infection: Secondary | ICD-10-CM | POA: Diagnosis not present

## 2020-05-02 DIAGNOSIS — R519 Headache, unspecified: Secondary | ICD-10-CM | POA: Diagnosis not present

## 2020-05-02 DIAGNOSIS — J984 Other disorders of lung: Secondary | ICD-10-CM | POA: Diagnosis not present

## 2020-05-02 DIAGNOSIS — G319 Degenerative disease of nervous system, unspecified: Secondary | ICD-10-CM | POA: Diagnosis not present

## 2020-05-02 DIAGNOSIS — J9811 Atelectasis: Secondary | ICD-10-CM | POA: Diagnosis not present

## 2020-05-02 DIAGNOSIS — S8992XA Unspecified injury of left lower leg, initial encounter: Secondary | ICD-10-CM | POA: Diagnosis not present

## 2020-05-02 DIAGNOSIS — Z85118 Personal history of other malignant neoplasm of bronchus and lung: Secondary | ICD-10-CM | POA: Diagnosis not present

## 2020-05-02 DIAGNOSIS — S0990XA Unspecified injury of head, initial encounter: Secondary | ICD-10-CM | POA: Diagnosis not present

## 2020-05-02 DIAGNOSIS — S199XXA Unspecified injury of neck, initial encounter: Secondary | ICD-10-CM | POA: Diagnosis not present

## 2020-05-02 DIAGNOSIS — S4992XA Unspecified injury of left shoulder and upper arm, initial encounter: Secondary | ICD-10-CM | POA: Diagnosis not present

## 2020-05-02 DIAGNOSIS — W050XXA Fall from non-moving wheelchair, initial encounter: Secondary | ICD-10-CM | POA: Diagnosis not present

## 2020-05-02 DIAGNOSIS — J189 Pneumonia, unspecified organism: Secondary | ICD-10-CM | POA: Diagnosis not present

## 2020-05-05 DIAGNOSIS — Z7902 Long term (current) use of antithrombotics/antiplatelets: Secondary | ICD-10-CM | POA: Diagnosis not present

## 2020-05-05 DIAGNOSIS — Z87891 Personal history of nicotine dependence: Secondary | ICD-10-CM | POA: Diagnosis not present

## 2020-05-05 DIAGNOSIS — J449 Chronic obstructive pulmonary disease, unspecified: Secondary | ICD-10-CM | POA: Diagnosis not present

## 2020-05-05 DIAGNOSIS — Z993 Dependence on wheelchair: Secondary | ICD-10-CM | POA: Diagnosis not present

## 2020-05-05 DIAGNOSIS — Z79891 Long term (current) use of opiate analgesic: Secondary | ICD-10-CM | POA: Diagnosis not present

## 2020-05-05 DIAGNOSIS — C3491 Malignant neoplasm of unspecified part of right bronchus or lung: Secondary | ICD-10-CM | POA: Diagnosis not present

## 2020-05-05 DIAGNOSIS — Z9981 Dependence on supplemental oxygen: Secondary | ICD-10-CM | POA: Diagnosis not present

## 2020-05-05 DIAGNOSIS — Z7952 Long term (current) use of systemic steroids: Secondary | ICD-10-CM | POA: Diagnosis not present

## 2020-05-05 DIAGNOSIS — I10 Essential (primary) hypertension: Secondary | ICD-10-CM | POA: Diagnosis not present

## 2020-05-11 ENCOUNTER — Telehealth: Payer: Self-pay | Admitting: Internal Medicine

## 2020-05-11 DIAGNOSIS — C3491 Malignant neoplasm of unspecified part of right bronchus or lung: Secondary | ICD-10-CM | POA: Diagnosis not present

## 2020-05-11 DIAGNOSIS — Z7952 Long term (current) use of systemic steroids: Secondary | ICD-10-CM | POA: Diagnosis not present

## 2020-05-11 DIAGNOSIS — Z9981 Dependence on supplemental oxygen: Secondary | ICD-10-CM | POA: Diagnosis not present

## 2020-05-11 DIAGNOSIS — I10 Essential (primary) hypertension: Secondary | ICD-10-CM | POA: Diagnosis not present

## 2020-05-11 DIAGNOSIS — Z79891 Long term (current) use of opiate analgesic: Secondary | ICD-10-CM | POA: Diagnosis not present

## 2020-05-11 DIAGNOSIS — J449 Chronic obstructive pulmonary disease, unspecified: Secondary | ICD-10-CM | POA: Diagnosis not present

## 2020-05-11 DIAGNOSIS — Z7902 Long term (current) use of antithrombotics/antiplatelets: Secondary | ICD-10-CM | POA: Diagnosis not present

## 2020-05-11 DIAGNOSIS — Z87891 Personal history of nicotine dependence: Secondary | ICD-10-CM | POA: Diagnosis not present

## 2020-05-11 DIAGNOSIS — Z993 Dependence on wheelchair: Secondary | ICD-10-CM | POA: Diagnosis not present

## 2020-05-11 NOTE — Telephone Encounter (Signed)
  Peter from [facility] calling for verbal orders for physical therapy for 1 time a week for 4 weeks. Peter#[phone] Okay to LVM

## 2020-05-12 NOTE — Telephone Encounter (Signed)
Orders given to [facility] at [facility]

## 2020-05-12 NOTE — Telephone Encounter (Signed)
Ok Thx 

## 2020-05-16 ENCOUNTER — Other Ambulatory Visit: Payer: Self-pay | Admitting: Internal Medicine

## 2020-05-18 DIAGNOSIS — I10 Essential (primary) hypertension: Secondary | ICD-10-CM | POA: Diagnosis not present

## 2020-05-18 DIAGNOSIS — Z993 Dependence on wheelchair: Secondary | ICD-10-CM | POA: Diagnosis not present

## 2020-05-18 DIAGNOSIS — J449 Chronic obstructive pulmonary disease, unspecified: Secondary | ICD-10-CM | POA: Diagnosis not present

## 2020-05-18 DIAGNOSIS — Z87891 Personal history of nicotine dependence: Secondary | ICD-10-CM | POA: Diagnosis not present

## 2020-05-18 DIAGNOSIS — Z9981 Dependence on supplemental oxygen: Secondary | ICD-10-CM | POA: Diagnosis not present

## 2020-05-18 DIAGNOSIS — C3491 Malignant neoplasm of unspecified part of right bronchus or lung: Secondary | ICD-10-CM | POA: Diagnosis not present

## 2020-05-18 DIAGNOSIS — Z7902 Long term (current) use of antithrombotics/antiplatelets: Secondary | ICD-10-CM | POA: Diagnosis not present

## 2020-05-18 DIAGNOSIS — Z7952 Long term (current) use of systemic steroids: Secondary | ICD-10-CM | POA: Diagnosis not present

## 2020-05-18 DIAGNOSIS — Z79891 Long term (current) use of opiate analgesic: Secondary | ICD-10-CM | POA: Diagnosis not present

## 2020-05-19 DIAGNOSIS — C3490 Malignant neoplasm of unspecified part of unspecified bronchus or lung: Secondary | ICD-10-CM | POA: Diagnosis not present

## 2020-05-26 ENCOUNTER — Other Ambulatory Visit: Payer: Self-pay

## 2020-05-27 ENCOUNTER — Encounter: Payer: Self-pay | Admitting: Internal Medicine

## 2020-05-27 ENCOUNTER — Ambulatory Visit (INDEPENDENT_AMBULATORY_CARE_PROVIDER_SITE_OTHER): Payer: Medicare HMO | Admitting: Internal Medicine

## 2020-05-27 DIAGNOSIS — J441 Chronic obstructive pulmonary disease with (acute) exacerbation: Secondary | ICD-10-CM | POA: Diagnosis not present

## 2020-05-27 DIAGNOSIS — G8929 Other chronic pain: Secondary | ICD-10-CM | POA: Diagnosis not present

## 2020-05-27 DIAGNOSIS — E559 Vitamin D deficiency, unspecified: Secondary | ICD-10-CM | POA: Diagnosis not present

## 2020-05-27 DIAGNOSIS — M5440 Lumbago with sciatica, unspecified side: Secondary | ICD-10-CM

## 2020-05-27 DIAGNOSIS — I25119 Atherosclerotic heart disease of native coronary artery with unspecified angina pectoris: Secondary | ICD-10-CM | POA: Diagnosis not present

## 2020-05-27 MED ORDER — HYDROCODONE-ACETAMINOPHEN 10-325 MG PO TABS: 1.0000 | ORAL_TABLET | Freq: Four times a day (QID) | ORAL | 0 refills | Status: DC | PRN

## 2020-05-27 NOTE — Progress Notes (Signed)
Subjective:  Patient ID: Kaylee Hansen, female    DOB: 1956/03/10  Age: 64 y.o. MRN: [id number]  CC: Follow-up (4 week f/u)   HPI Kaylee Hansen presents for her h/o fall. Pt had another fall and SOB - went to [place] Medications Prior to Visit  Medication Sig Dispense Refill  . albuterol (PROVENTIL HFA;VENTOLIN HFA) 108 (90 Base) MCG/ACT inhaler Inhale 2 puffs into the lungs every 6 (six) hours as needed for wheezing or shortness of breath. 1 Inhaler 5  . albuterol (PROVENTIL) (2.5 MG/3ML) 0.083% nebulizer solution INHALE 1 VIAL BY NEBULIZATION EVERY 4 HOURS AS NEEDED FOR WHEEZING OR SHORTNESS OF BREATH. 150 mL 5  . ALPRAZolam (XANAX) 0.25 MG tablet Take 1 tablet (0.25 mg total) by mouth at bedtime as needed for anxiety. 90 tablet 1  . bisacodyl (DULCOLAX) 5 MG EC tablet Take 2 tablets (10 mg total) by mouth at bedtime. (Patient taking differently: Take 10 mg by mouth daily as needed for moderate constipation. ) 30 tablet   . clobetasol (TEMOVATE) 0.05 % external solution Apply 1 application topically 2 (two) times daily. For itchy scalp lesions 50 mL 3  . clopidogrel (PLAVIX) 75 MG tablet TAKE 1 TABLET (75 MG TOTAL) BY MOUTH DAILY WITH BREAKFAST. (Patient taking differently: Take 75 mg by mouth daily. ) 90 tablet 3  . diltiazem (CARDIZEM CD) 240 MG 24 hr capsule Take 1 capsule (240 mg total) by mouth daily. 90 capsule 3  . diphenhydrAMINE HCl, Sleep, (ZZZQUIL) 25 MG CAPS Take 50 mg by mouth at bedtime as needed (sleep).    . furosemide (LASIX) 20 MG tablet Take 1 tablet by mouth once daily 90 tablet 2  . guaiFENesin (MUCINEX) 600 MG 12 hr tablet Take 1 tablet (600 mg total) by mouth 2 (two) times daily as needed for cough or to loosen phlegm. 14 tablet 0  . HYDROcodone-acetaminophen (NORCO) 10-325 MG tablet Take 1 tablet by mouth every 6 (six) hours as needed for severe pain. Please fill on or after 05/20/20 120 tablet 0  . hydroxypropyl methylcellulose / hypromellose  (ISOPTO TEARS / GONIOVISC) 2.5 % ophthalmic solution Place 1 drop into both eyes 3 (three) times daily as needed for dry eyes.    . isosorbide mononitrate (IMDUR) 60 MG 24 hr tablet Take 1 tablet (60 mg total) by mouth daily. 90 tablet 3  . ketoconazole (NIZORAL) 2 % cream APPLY TO AFFECTED AREA EVERY DAY 45 g 1  . levothyroxine (SYNTHROID) 150 MCG tablet Take 1 tablet (150 mcg total) by mouth daily. 90 tablet 3  . nitroGLYCERIN (NITROSTAT) 0.4 MG SL tablet DISSOLVE ONE TABLET UNDER THE TONGUE EVERY 5 MINUTES AS NEEDED FOR CHEST PAIN.  DO NOT EXCEED A TOTAL OF 3 DOSES IN 15 MINUTES 25 tablet 6  . OXYGEN Inhale 2.5 L/min into the lungs at bedtime.    . pantoprazole (PROTONIX) 40 MG tablet TAKE 1 TABLET BY MOUTH DAILY BEFORE BREAKFAST (Patient taking differently: Take 40 mg by mouth daily. ) 90 tablet 3  . PARoxetine (PAXIL) 40 MG tablet Take 1 tablet (40 mg total) by mouth daily. 90 tablet 3  . phenytoin (DILANTIN) 100 MG ER capsule TAKE 100 MG EVERY MORNING AND 200 MG EVERY NIGHT. 270 capsule 3  . promethazine (PHENERGAN) 25 MG tablet Take 1 tablet (25 mg total) by mouth every 8 (eight) hours as needed for nausea or vomiting. 90 tablet 1  . Respiratory Therapy Supplies (FLUTTER) DEVI As directed 1  each 0  . rosuvastatin (CRESTOR) 20 MG tablet Take 1 tablet (20 mg total) by mouth daily. 90 tablet 3  . SUMAtriptan (IMITREX) 100 MG tablet MAY REPEAT IN 2 HOURS IF HEADACHE PERSISTS OR RECURS. 9 tablet 3  . traZODone (DESYREL) 100 MG tablet TAKE 1 TABLET BY MOUTH EVERYDAY AT BEDTIME 90 tablet 3  . doxycycline (VIBRA-TABS) 100 MG tablet Take 1 tablet (100 mg total) by mouth 2 (two) times daily. (Patient not taking: Reported on 05/27/2020) 20 tablet 0  . methylPREDNISolone (MEDROL DOSEPAK) 4 MG TBPK tablet As directed (Patient not taking: Reported on 05/27/2020) 21 tablet 0  . predniSONE (STERAPRED UNI-PAK 21 TAB) 10 MG (21) TBPK tablet Follow direction on package (Patient not taking: Reported on 05/27/2020)  21 tablet 0   No facility-administered medications prior to visit.    ROS: Review of Systems  Constitutional: Positive for fatigue. Negative for activity change, appetite change, chills and unexpected weight change.  HENT: Negative for congestion, mouth sores and sinus pressure.   Eyes: Negative for visual disturbance.  Respiratory: Positive for cough and shortness of breath. Negative for chest tightness.   Gastrointestinal: Negative for abdominal pain and nausea.  Genitourinary: Negative for difficulty urinating, frequency and vaginal pain.  Musculoskeletal: Positive for arthralgias, back pain and gait problem.  Skin: Negative for pallor and rash.  Neurological: Positive for dizziness and headaches. Negative for tremors, weakness and numbness.  Psychiatric/Behavioral: Positive for decreased concentration and sleep disturbance. Negative for confusion and suicidal ideas. The patient is nervous/anxious.     Objective:  BP (!) 148/60 (BP Location: Left Arm)   Pulse 91   Temp 98.7 F (37.1 C) (Oral)   Wt 171 lb 6.4 oz (77.7 kg)   SpO2 93%   BMI 28.52 kg/m   BP Readings from Last 3 Encounters:  05/27/20 (!) 148/60  04/29/20 (!) 130/58  04/08/20 (!) 130/44    Wt Readings from Last 3 Encounters:  05/27/20 171 lb 6.4 oz (77.7 kg)  04/08/20 169 lb 9.6 oz (76.9 kg)  03/25/20 171 lb (77.6 kg)    Physical Exam Constitutional:      General: She is not in acute distress.    Appearance: She is well-developed. She is ill-appearing. She is not toxic-appearing.  HENT:     Head: Normocephalic.     Right Ear: External ear normal.     Left Ear: External ear normal.     Nose: Nose normal.     Mouth/Throat:     Mouth: Oropharynx is clear and moist.  Eyes:     General:        Right eye: No discharge.        Left eye: No discharge.     Conjunctiva/sclera: Conjunctivae normal.     Pupils: Pupils are equal, round, and reactive to light.  Neck:     Thyroid: No thyromegaly.      Vascular: No JVD.     Trachea: No tracheal deviation.  Cardiovascular:     Rate and Rhythm: Normal rate and regular rhythm.     Heart sounds: Normal heart sounds.  Pulmonary:     Effort: No respiratory distress.     Breath sounds: No stridor. No wheezing.  Abdominal:     General: Bowel sounds are normal. There is no distension.     Palpations: Abdomen is soft. There is no mass.     Tenderness: There is no abdominal tenderness. There is no guarding or rebound.  Musculoskeletal:  General: Tenderness present. No edema.     Cervical back: Normal range of motion and neck supple.  Lymphadenopathy:     Cervical: No cervical adenopathy.  Skin:    Findings: No erythema or rash.  Neurological:     Cranial Nerves: No cranial nerve deficit.     Motor: Weakness present. No abnormal muscle tone.     Coordination: Coordination abnormal.     Gait: Gait abnormal.     Deep Tendon Reflexes: Reflexes normal.  Psychiatric:        Mood and Affect: Mood and affect normal.        Behavior: Behavior normal.        Thought Content: Thought content normal.        Judgment: Judgment normal.   In a wheelchair Cervical spine, lumbar spine tender with range of motion, palpation She is alert and cooperative  Lab Results  Component Value Date   WBC 6.0 03/25/2020   HGB 11.7 (L) 03/25/2020   HCT 36.4 03/25/2020   PLT 284 03/25/2020   GLUCOSE 92 03/25/2020   CHOL 128 06/29/2018   TRIG 184 (H) 06/29/2018   HDL 35 (L) 06/29/2018   LDLDIRECT 187.0 07/14/2015   LDLCALC 56 06/29/2018   ALT 7 03/25/2020   AST 10 (L) 03/25/2020   NA 139 03/25/2020   K 4.5 03/25/2020   CL 103 03/25/2020   CREATININE 1.03 (H) 03/25/2020   BUN 10 03/25/2020   CO2 30 03/25/2020   TSH 1.84 01/01/2019   INR 0.9 09/22/2019   HGBA1C  09/17/2008    5.0 (NOTE)   The ADA recommends the following therapeutic goal for glycemic   control related to Hgb A1C measurement:   Goal of Therapy:   < 7.0% Hgb A1C   Reference:  American Diabetes Association: Clinical Practice   Recommendations 2008, Diabetes Care,  2008, 31:(Suppl 1).    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 EXAM: NUCLEAR MEDICINE PET SKULL BASE TO THIGH TECHNIQUE: 8.7 mCi F-18 FDG was injected intravenously. Full-ring PET imaging was performed from the skull base to thigh after the radiotracer. CT data was obtained and used for attenuation correction and anatomic localization. Fasting blood glucose: 111 mg/dl COMPARISON:  Multiple exams, including 01/21/2020 FINDINGS: Mediastinal blood pool activity: SUV max 3.1 Liver activity: SUV max N/A NECK: No significant abnormal hypermetabolic activity in this region. Incidental CT findings: Bilateral common carotid atherosclerotic calcification. CHEST: Increased volume loss and/or consolidation in the lingula near the previously hypermetabolic pulmonary nodule. The nodule currently measures about 1.3 by 1.2 cm on image 72/4 (formerly approximately the same) and has a maximum SUV of 4.6 (formerly 7.9). A left upper lobe nodule posteriorly on image 50 of series 4 measures about 0.8 cm in diameter, stable by my measurements, maximum SUV in this vicinity is 1.1 (no significant accentuated metabolic activity). Low-grade adenocarcinoma is not totally excluded and surveillance is likely warranted. Post therapy related findings in the right upper lobe medially with some local consolidation and air bronchograms, maximum SUV in this vicinity 3.7 (formerly 3.2). No obvious internal focus of higher activity within this region of local consolidation. Incidental CT findings: Coronary, aortic arch, and branch vessel atherosclerotic vascular disease. Centrilobular emphysema. New trace left pleural effusion. Mild atelectasis along the left inferior pulmonary ligament. ABDOMEN/PELVIS: No significant  abnormal hypermetabolic activity in this region. Incidental  CT findings: Aortoiliac atherosclerotic vascular disease. Cholecystectomy. Photopenic left renal cysts. SKELETON: No significant abnormal hypermetabolic activity in this region. Incidental CT findings: Left hip prosthesis. Old bilateral rib fracture deformities. Postoperative findings on the right at L4-5. Chronic T2 compression fracture. Old right medial clavicular fracture IMPRESSION: 1. Further reduction in activity of the left upper lobe nodule, current maximum SUV 4.6 (previously 7.9). There is some increase in surrounding atelectasis/consolidation possibly related to prior SBRT. 2. Post therapy related findings in the right upper lobe medially, similar to prior. 3. Stable 0.8 cm left upper lobe nodule posteriorly with maximum SUV of only 1.1, nonspecific but low-grade adenocarcinoma not excluded and surveillance recommended. 4. New trace left pleural effusion. 5. Other imaging findings of potential clinical significance: Aortic Atherosclerosis (ICD10-I70.0) and Emphysema (ICD10-J43.9). Coronary atherosclerosis. Electronically Signed   By: Van Clines M.D.   On: 03/17/2020 11:05    Assessment & Plan:    Walker Kehr, MD

## 2020-06-07 ENCOUNTER — Encounter: Payer: Self-pay | Admitting: Internal Medicine

## 2020-06-07 NOTE — Assessment & Plan Note (Signed)
She continues to smoke.  Discussed.  The need for smoking cessation

## 2020-06-07 NOTE — Assessment & Plan Note (Signed)
On vitamin D

## 2020-06-07 NOTE — Assessment & Plan Note (Signed)
Chronic Norco, (Fentanyl - d/c 7/17) Chronic pain syndrome  Potential benefits of a long term opioids use as well as potential risks (i.e. addiction risk, apnea etc) and complications (i.e. Somnolence, constipation and others) were explained to the patient and were aknowledged. Narcane nasal prn Do not take w/Xanax

## 2020-06-07 NOTE — Assessment & Plan Note (Signed)
No angina.  On Imdur, Crestor, Plavix

## 2020-06-09 ENCOUNTER — Other Ambulatory Visit: Payer: Self-pay | Admitting: Cardiology

## 2020-06-17 ENCOUNTER — Telehealth: Payer: Medicare HMO | Admitting: Family Medicine

## 2020-06-18 ENCOUNTER — Other Ambulatory Visit: Payer: Self-pay | Admitting: Internal Medicine

## 2020-06-18 DIAGNOSIS — C3490 Malignant neoplasm of unspecified part of unspecified bronchus or lung: Secondary | ICD-10-CM | POA: Diagnosis not present

## 2020-06-28 ENCOUNTER — Ambulatory Visit (HOSPITAL_COMMUNITY): Payer: Medicare HMO | Attending: Cardiology

## 2020-06-28 ENCOUNTER — Other Ambulatory Visit: Payer: Self-pay

## 2020-06-28 DIAGNOSIS — I351 Nonrheumatic aortic (valve) insufficiency: Secondary | ICD-10-CM | POA: Diagnosis not present

## 2020-06-28 LAB — ECHOCARDIOGRAM COMPLETE
AR max vel: 1.51 cm2
AV Area VTI: 1.8 cm2
AV Area mean vel: 1.68 cm2
AV Mean grad: 14 mmHg
AV Peak grad: 26.6 mmHg
Ao pk vel: 2.58 m/s
Area-P 1/2: 4.68 cm2
P 1/2 time: 218 msec
S' Lateral: 2.7 cm

## 2020-07-02 ENCOUNTER — Telehealth: Payer: Self-pay | Admitting: Internal Medicine

## 2020-07-02 NOTE — Telephone Encounter (Signed)
Pt states that she has been experiencing stomach pain since yesterday. She states that pain was intermittent yesterday but today is not going away. She would like to be seen asap.

## 2020-07-02 NOTE — Telephone Encounter (Signed)
Left message for patient to call back  

## 2020-07-05 NOTE — Telephone Encounter (Signed)
Left message for patient to call back  

## 2020-07-06 ENCOUNTER — Telehealth (INDEPENDENT_AMBULATORY_CARE_PROVIDER_SITE_OTHER): Payer: Medicare HMO | Admitting: Internal Medicine

## 2020-07-06 ENCOUNTER — Other Ambulatory Visit: Payer: Self-pay

## 2020-07-06 ENCOUNTER — Telehealth: Payer: Self-pay | Admitting: Internal Medicine

## 2020-07-06 ENCOUNTER — Encounter: Payer: Self-pay | Admitting: Internal Medicine

## 2020-07-06 DIAGNOSIS — R109 Unspecified abdominal pain: Secondary | ICD-10-CM

## 2020-07-06 NOTE — Telephone Encounter (Signed)
Left message for patient to call back  

## 2020-07-06 NOTE — Progress Notes (Signed)
Virtual Visit via telephone Note  I connected with Kaylee Hansen on 07/06/20 at  1:30 PM EST by telephone and verified that I am speaking with the correct person using two identifiers.   I discussed the limitations of evaluation and management by telemedicine and the availability of in person appointments. The patient expressed understanding and agreed to proceed.  Present for the visit:  Myself, Dr Billey Gosling, [facility].  The patient is currently at home and I am in the office.    No referring provider.    History of Present Illness: This is an acute visit for stomach pain and diarrhea.  Her symptoms started about two weeks ago.  She initially had constipation and took dulcolax and stool softener.  She then had diarrhea x 3-4 days.  That resolved on its own.  The abdomen pain started when the diarrhea started going away.    Her abdominal pain is 12/10.  No bowel movements since diarrhea resolved.  It has been several days.  She has passed gas but it has been a couple of days.     Abdomen is distended.   No blood in stool.   No fever/chills.  She ate some yesterday, but none today.    He does have a history of bowel obstruction.     ROS    Social History   Socioeconomic History  . Marital status: Married    Spouse name: Not on file  . Number of children: 2  . Years of education: Not on file  . Highest education level: Not on file  Occupational History  . Occupation: disabled    Employer: DISABLED  Tobacco Use  . Smoking status: Current Every Day Smoker    Packs/day: 0.50    Years: 48.00    Pack years: 24.00    Types: Cigarettes  . Smokeless tobacco: Never Used  . Tobacco comment: 10/01/19 - 7 cigs/day  Vaping Use  . Vaping Use: Never used  Substance and Sexual Activity  . Alcohol use: No  . Drug use: No  . Sexual activity: Not Currently  Other Topics Concern  . Not on file  Social History Narrative   Married   2 children   No regular exercise          Social Determinants of Health   Financial Resource Strain: Low Risk   . Difficulty of Paying Living Expenses: Not hard at all  Food Insecurity: No Food Insecurity  . Worried About Charity fundraiser in the Last Year: Never true  . Ran Out of Food in the Last Year: Never true  Transportation Needs: No Transportation Needs  . Lack of Transportation (Medical): No  . Lack of Transportation (Non-Medical): No  Physical Activity: Inactive  . Days of Exercise per Week: 0 days  . Minutes of Exercise per Session: 0 min  Stress: No Stress Concern Present  . Feeling of Stress : Not at all  Social Connections: Not on file     Observations/Objective:      Assessment and Plan:  Abdominal pain: Acute Severe in nature Started after a bout of constipation that after treatment resulted in a few days of diarrhea and then the abdominal pain started She has not passed gas in a couple of days and has not had a bowel movement in several days-she does not recall when.  Her abdomen is distended and she is unable to eat She does have a history of bowel obstruction She is currently  being treated for cancer and getting PET scans every few months-last PET scan reviewed and she has a CAT scan scheduled for this month Given the above instructed her that she needs further evaluation at the emergency room, which she agrees with She will have her husband drive her to [facility]     Follow Up Instructions:    I discussed the assessment and treatment plan with the patient. The patient was provided an opportunity to ask questions and all were answered. The patient agreed with the plan and demonstrated an understanding of the instructions.   The patient was advised to call back or seek an in-person evaluation if the symptoms worsen or if the condition fails to improve as anticipated.  Time spent on telephone call : 14 minutes  Binnie Rail, MD

## 2020-07-06 NOTE — Telephone Encounter (Signed)
See new phone note from today.

## 2020-07-07 DIAGNOSIS — R1031 Right lower quadrant pain: Secondary | ICD-10-CM | POA: Diagnosis not present

## 2020-07-07 DIAGNOSIS — M87051 Idiopathic aseptic necrosis of right femur: Secondary | ICD-10-CM | POA: Diagnosis not present

## 2020-07-07 DIAGNOSIS — R1032 Left lower quadrant pain: Secondary | ICD-10-CM | POA: Diagnosis not present

## 2020-07-07 DIAGNOSIS — M87851 Other osteonecrosis, right femur: Secondary | ICD-10-CM | POA: Diagnosis not present

## 2020-07-07 DIAGNOSIS — K2970 Gastritis, unspecified, without bleeding: Secondary | ICD-10-CM | POA: Diagnosis not present

## 2020-07-07 NOTE — Telephone Encounter (Signed)
Left message for patient to call back  

## 2020-07-08 NOTE — Telephone Encounter (Signed)
Inbound call from patient returning your call.  Can be reached at [phone].

## 2020-07-08 NOTE — Telephone Encounter (Signed)
Left message for patient to call back  

## 2020-07-09 NOTE — Telephone Encounter (Signed)
Left message for patient to call back  

## 2020-07-13 ENCOUNTER — Ambulatory Visit (HOSPITAL_COMMUNITY): Payer: Medicare HMO

## 2020-07-13 NOTE — Telephone Encounter (Signed)
No return calls from the patient 

## 2020-07-15 ENCOUNTER — Other Ambulatory Visit: Payer: Self-pay

## 2020-07-15 ENCOUNTER — Encounter: Payer: Self-pay | Admitting: Internal Medicine

## 2020-07-15 ENCOUNTER — Ambulatory Visit (INDEPENDENT_AMBULATORY_CARE_PROVIDER_SITE_OTHER): Payer: Medicare HMO | Admitting: Internal Medicine

## 2020-07-15 VITALS — BP 130/40 | HR 90 | Temp 98.2°F | Ht 65.0 in | Wt 173.0 lb

## 2020-07-15 DIAGNOSIS — N10 Acute tubulo-interstitial nephritis: Secondary | ICD-10-CM | POA: Diagnosis not present

## 2020-07-15 DIAGNOSIS — R10819 Abdominal tenderness, unspecified site: Secondary | ICD-10-CM

## 2020-07-15 DIAGNOSIS — R300 Dysuria: Secondary | ICD-10-CM

## 2020-07-15 DIAGNOSIS — I10 Essential (primary) hypertension: Secondary | ICD-10-CM

## 2020-07-15 NOTE — Progress Notes (Signed)
Subjective:  Patient ID: Kaylee Hansen, female    DOB: [birth year]  Age: 65 y.o. MRN: [id number]  CC: Urinary Tract Infection  This visit occurred during the SARS-CoV-2 public health emergency.  Safety protocols were in place, including screening questions prior to the visit, additional usage of staff PPE, and extensive cleaning of exam room while observing appropriate contact time as indicated for disinfecting solutions.   NEW TO ME  HPI Kaylee Hansen presents for f/up - It is difficult to get a get history from her but it sounds like she has had dysuria for a week and prior to that she had right flank and right lower quadrant pain for several weeks.  She also complains of nausea, vomiting, chronic shortness of breath, and poor appetite.  She denies fever or chills.  Outpatient Medications Prior to Visit  Medication Sig Dispense Refill  . albuterol (PROVENTIL) (2.5 MG/3ML) 0.083% nebulizer solution INHALE 1 VIAL BY NEBULIZATION EVERY 4 HOURS AS NEEDED FOR WHEEZING OR SHORTNESS OF BREATH. 150 mL 5  . ALPRAZolam (XANAX) 0.25 MG tablet Take 1 tablet (0.25 mg total) by mouth at bedtime as needed for anxiety. 90 tablet 1  . bisacodyl (DULCOLAX) 5 MG EC tablet Take 2 tablets (10 mg total) by mouth at bedtime. (Patient taking differently: Take 10 mg by mouth daily as needed for moderate constipation.) 30 tablet   . clobetasol (TEMOVATE) 0.05 % external solution Apply 1 application topically 2 (two) times daily. For itchy scalp lesions 50 mL 3  . clopidogrel (PLAVIX) 75 MG tablet TAKE 1 TABLET (75 MG TOTAL) BY MOUTH DAILY WITH BREAKFAST. 90 tablet 3  . diphenhydrAMINE HCl, Sleep, (ZZZQUIL) 25 MG CAPS Take 50 mg by mouth at bedtime as needed (sleep).    . furosemide (LASIX) 20 MG tablet Take 1 tablet by mouth once daily 90 tablet 2  . guaiFENesin (MUCINEX) 600 MG 12 hr tablet Take 1 tablet (600 mg total) by mouth 2 (two) times daily as needed for cough or to loosen phlegm. 14 tablet 0  .  HYDROcodone-acetaminophen (NORCO) 10-325 MG tablet Take 1 tablet by mouth every 6 (six) hours as needed for severe pain. Please fill on or after 06/19/20 120 tablet 0  . hydroxypropyl methylcellulose / hypromellose (ISOPTO TEARS / GONIOVISC) 2.5 % ophthalmic solution Place 1 drop into both eyes 3 (three) times daily as needed for dry eyes.    [name] ketoconazole (NIZORAL) 2 % cream APPLY TO AFFECTED AREA EVERY DAY 45 g 1  . levothyroxine (SYNTHROID) 150 MCG tablet Take 1 tablet (150 mcg total) by mouth daily. 90 tablet 3  . nitroGLYCERIN (NITROSTAT) 0.4 MG SL tablet DISSOLVE ONE TABLET UNDER THE TONGUE EVERY 5 MINUTES AS NEEDED FOR CHEST PAIN.  DO NOT EXCEED A TOTAL OF 3 DOSES IN 15 MINUTES 25 tablet 6  . OXYGEN Inhale 2.5 L/min into the lungs at bedtime.    . pantoprazole (PROTONIX) 40 MG tablet TAKE 1 TABLET BY MOUTH DAILY BEFORE BREAKFAST (Patient taking differently: Take 40 mg by mouth daily.) 90 tablet 3  . PARoxetine (PAXIL) 40 MG tablet TAKE 1 TABLET BY MOUTH EVERY DAY 90 tablet 3  . phenytoin (DILANTIN) 100 MG ER capsule TAKE 100 MG EVERY MORNING AND 200 MG EVERY NIGHT. 270 capsule 3  . Respiratory Therapy Supplies (FLUTTER) DEVI As directed 1 each 0  . SUMAtriptan (IMITREX) 100 MG tablet MAY REPEAT IN 2 HOURS IF HEADACHE PERSISTS OR RECURS. 9 tablet 3  . traZODone (DESYREL)  100 MG tablet TAKE 1 TABLET BY MOUTH EVERYDAY AT BEDTIME 90 tablet 3  . promethazine (PHENERGAN) 25 MG tablet Take 1 tablet (25 mg total) by mouth every 8 (eight) hours as needed for nausea or vomiting. 90 tablet 1  . albuterol (PROVENTIL HFA;VENTOLIN HFA) 108 (90 Base) MCG/ACT inhaler Inhale 2 puffs into the lungs every 6 (six) hours as needed for wheezing or shortness of breath. 1 Inhaler 5  . diltiazem (CARDIZEM CD) 240 MG 24 hr capsule Take 1 capsule (240 mg total) by mouth daily. 90 capsule 3  . HYDROcodone-acetaminophen (NORCO) 10-325 MG tablet Take 1 tablet by mouth every 6 (six) hours as needed for up to 5 days for  severe pain. 120 tablet 0  . HYDROcodone-acetaminophen (NORCO) 10-325 MG tablet Take 1 tablet by mouth every 6 (six) hours as needed for up to 5 days for severe pain. 120 tablet 0  . isosorbide mononitrate (IMDUR) 60 MG 24 hr tablet Take 1 tablet (60 mg total) by mouth daily. 90 tablet 3  . rosuvastatin (CRESTOR) 20 MG tablet Take 1 tablet (20 mg total) by mouth daily. 90 tablet 3   No facility-administered medications prior to visit.    ROS Review of Systems  Constitutional: Negative for appetite change, chills, diaphoresis, fatigue and fever.  HENT: Negative.   Eyes: Negative.   Respiratory: Positive for cough and shortness of breath. Negative for chest tightness and wheezing.   Cardiovascular: Negative for chest pain, palpitations and leg swelling.  Gastrointestinal: Positive for abdominal pain, nausea and vomiting. Negative for constipation and diarrhea.  Genitourinary: Positive for dysuria and flank pain. Negative for difficulty urinating, frequency and hematuria.  Musculoskeletal: Negative for back pain.  Skin: Negative.   Neurological: Negative.  Negative for dizziness, weakness, light-headedness and headaches.  Hematological: Negative for adenopathy. Does not bruise/bleed easily.  Psychiatric/Behavioral: Negative.     Objective:  BP (!) 130/40 (BP Location: Left Arm, Patient Position: Sitting, Cuff Size: Normal)   Pulse 90   Temp 98.2 F (36.8 C) (Oral)   Ht 5\' 5"  (1.651 m)   Wt 173 lb (78.5 kg)   SpO2 92%   BMI 28.79 kg/m   BP Readings from Last 3 Encounters:  07/15/20 (!) 130/40  05/27/20 (!) 148/60  04/29/20 (!) 130/58    Wt Readings from Last 3 Encounters:  07/15/20 173 lb (78.5 kg)  05/27/20 171 lb 6.4 oz (77.7 kg)  04/08/20 169 lb 9.6 oz (76.9 kg)    Physical Exam Constitutional:      Appearance: She is ill-appearing. She is not toxic-appearing or diaphoretic.  HENT:     Nose: Nose normal.     Mouth/Throat:     Mouth: Mucous membranes are moist.   Eyes:     General: No scleral icterus.    Conjunctiva/sclera: Conjunctivae normal.  Cardiovascular:     Rate and Rhythm: Normal rate and regular rhythm.     Heart sounds: No murmur heard.   Pulmonary:     Effort: Tachypnea and accessory muscle usage present.     Breath sounds: Examination of the right-middle field reveals rhonchi. Examination of the left-middle field reveals wheezing and rhonchi. Examination of the right-lower field reveals rhonchi. Examination of the left-lower field reveals wheezing and rhonchi. Wheezing and rhonchi present. No decreased breath sounds or rales.  Abdominal:     General: Abdomen is flat.     Palpations: There is no hepatomegaly, splenomegaly or mass.     Tenderness: There is abdominal tenderness  in the right upper quadrant. There is right CVA tenderness.     Hernia: No hernia is present.  Musculoskeletal:        General: Normal range of motion.     Cervical back: Neck supple.     Right lower leg: No edema.     Left lower leg: No edema.  Lymphadenopathy:     Cervical: No cervical adenopathy.  Skin:    General: Skin is warm and dry.     Coloration: Skin is not pale.  Neurological:     General: No focal deficit present.     Mental Status: She is alert.  Psychiatric:        Mood and Affect: Mood normal.     Lab Results  Component Value Date   WBC 6.0 03/25/2020   HGB 11.7 (L) 03/25/2020   HCT 36.4 03/25/2020   PLT 284 03/25/2020   GLUCOSE 92 03/25/2020   CHOL 128 06/29/2018   TRIG 184 (H) 06/29/2018   HDL 35 (L) 06/29/2018   LDLDIRECT 187.0 07/14/2015   LDLCALC 56 06/29/2018   ALT 7 03/25/2020   AST 10 (L) 03/25/2020   NA 139 03/25/2020   K 4.5 03/25/2020   CL 103 03/25/2020   CREATININE 1.03 (H) 03/25/2020   BUN 10 03/25/2020   CO2 30 03/25/2020   TSH 1.84 01/01/2019   INR 0.9 09/22/2019   HGBA1C  09/17/2008    5.0 (NOTE)   The ADA recommends the following therapeutic goal for glycemic   control related to Hgb A1C measurement:    Goal of Therapy:   < 7.0% Hgb A1C   Reference: American Diabetes Association: Clinical Practice   Recommendations 2008, Diabetes Care,  2008, 31:(Suppl 1).    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 EXAM: NUCLEAR MEDICINE PET SKULL BASE TO THIGH TECHNIQUE: 8.7 mCi F-18 FDG was injected intravenously. Full-ring PET imaging was performed from the skull base to thigh after the radiotracer. CT data was obtained and used for attenuation correction and anatomic localization. Fasting blood glucose: 111 mg/dl COMPARISON:  Multiple exams, including 01/21/2020 FINDINGS: Mediastinal blood pool activity: SUV max 3.1 Liver activity: SUV max N/A NECK: No significant abnormal hypermetabolic activity in this region. Incidental CT findings: Bilateral common carotid atherosclerotic calcification. CHEST: Increased volume loss and/or consolidation in the lingula near the previously hypermetabolic pulmonary nodule. The nodule currently measures about 1.3 by 1.2 cm on image 72/4 (formerly approximately the same) and has a maximum SUV of 4.6 (formerly 7.9). A left upper lobe nodule posteriorly on image 50 of series 4 measures about 0.8 cm in diameter, stable by my measurements, maximum SUV in this vicinity is 1.1 (no significant accentuated metabolic activity). Low-grade adenocarcinoma is not totally excluded and surveillance is likely warranted. Post therapy related findings in the right upper lobe medially with some local consolidation and air bronchograms, maximum SUV in this vicinity 3.7 (formerly 3.2). No obvious internal focus of higher activity within this region of local consolidation. Incidental CT findings: Coronary, aortic arch, and branch vessel atherosclerotic vascular disease. Centrilobular emphysema. New trace left pleural effusion. Mild atelectasis along the left inferior  pulmonary ligament. ABDOMEN/PELVIS: No significant abnormal hypermetabolic activity in this region. Incidental CT findings: Aortoiliac atherosclerotic vascular disease. Cholecystectomy. Photopenic left renal cysts. SKELETON: No significant abnormal hypermetabolic activity in this region. Incidental CT findings: Left hip prosthesis.  Old bilateral rib fracture deformities. Postoperative findings on the right at L4-5. Chronic T2 compression fracture. Old right medial clavicular fracture IMPRESSION: 1. Further reduction in activity of the left upper lobe nodule, current maximum SUV 4.6 (previously 7.9). There is some increase in surrounding atelectasis/consolidation possibly related to prior SBRT. 2. Post therapy related findings in the right upper lobe medially, similar to prior. 3. Stable 0.8 cm left upper lobe nodule posteriorly with maximum SUV of only 1.1, nonspecific but low-grade adenocarcinoma not excluded and surveillance recommended. 4. New trace left pleural effusion. 5. Other imaging findings of potential clinical significance: Aortic Atherosclerosis (ICD10-I70.0) and Emphysema (ICD10-J43.9). Coronary atherosclerosis. Electronically Signed   By: Van Clines M.D.   On: 03/17/2020 11:05   Results for SHAWNIECE, OYOLA (MRN [id number]) as of 07/16/2020 13:45  Ref. Range 07/15/2020 16:16  URINALYSIS, ROUTINE W REFLEX MICROSCOPIC Unknown Rpt (A)  Appearance Latest Ref Range: Clear;Turbid;Slightly Cloudy;Cloudy  CLEAR  Bilirubin Urine Latest Ref Range: Negative  NEGATIVE  Color, Urine Latest Ref Range: Yellow;Lt. Yellow;Straw;Dark Yellow;Amber;Green;Red;Brown  YELLOW  Hgb urine dipstick Latest Ref Range: Negative  TRACE-INTACT (A)  Ketones, ur Latest Ref Range: Negative  NEGATIVE  Leukocytes,Ua Latest Ref Range: Negative  MODERATE (A)  Nitrite Latest Ref Range: Negative  POSITIVE (A)  pH Latest Ref Range: 5.0 - 8.0  7.0  Specific Gravity, Urine Latest Ref Range: 1.000 - 1.030  1.010  Urine Glucose  Latest Ref Range: Negative  NEGATIVE  Urobilinogen, UA Latest Ref Range: 0.0 - 1.0  0.2  Bacteria, UA Latest Ref Range: None  Many(>50/hpf) (A)  RBC / HPF Latest Ref Range: 0-2/hpf  0-2/hpf  Squamous Epithelial / LPF Latest Ref Range: Rare(0-4/hpf)  Rare(0-4/hpf)  WBC, UA Latest Ref Range: 0-2/hpf  11-20/hpf (A)  Total Protein, Urine-UPE24 Latest Ref Range: Negative  NEGATIVE    Assessment & Plan:   Kaylee Hansen was seen today for urinary tract infection.  Diagnoses and all orders for this visit:  Dysuria- Her UA is c/w infection. -     POCT Urinalysis Dipstick (Automated) -     Urinalysis, Routine w reflex microscopic; Future -     CULTURE, URINE COMPREHENSIVE; Future -     CULTURE, URINE COMPREHENSIVE -     Urinalysis, Routine w reflex microscopic  Abdominal tenderness in right flank- Her UA is positive for leukocytes and nitrites.  She has pain in the area so I recommended that she undergo a CT with contrast to see if she has an abscess, stone, infected cyst, obstruction. -     CBC with Differential/Platelet; Future -     Basic metabolic panel; Future -     Hepatic function panel; Future -     Lipase; Future -     Amylase; Future -     CT Abdomen Pelvis W Contrast; Future  Essential hypertension, benign- Her BP is well controlled. -     CBC with Differential/Platelet; Future -     Basic metabolic panel; Future -     Hepatic function panel; Future  Acute pyelonephritis- Will empirically treat with Bactrim DS.  I recommended that she take promethazine as needed for nausea and vomiting. -     sulfamethoxazole-trimethoprim (BACTRIM DS) 800-160 MG tablet; Take 1 tablet by mouth 2 (two) times daily for 7 days. -     promethazine (PHENERGAN) 25 MG tablet; Take 1 tablet (25 mg total) by mouth every 8 (eight) hours as needed for nausea or vomiting.   I am having Kaylee  L. Hansen start on sulfamethoxazole-trimethoprim. I am also having her maintain her guaiFENesin, Flutter, OXYGEN,  albuterol, bisacodyl, levothyroxine, pantoprazole, hydroxypropyl methylcellulose / hypromellose, ZzzQuil, clobetasol, ketoconazole, phenytoin, furosemide, nitroGLYCERIN, rosuvastatin, diltiazem, isosorbide mononitrate, traZODone, albuterol, ALPRAZolam, SUMAtriptan, HYDROcodone-acetaminophen, HYDROcodone-acetaminophen, HYDROcodone-acetaminophen, clopidogrel, PARoxetine, and promethazine.  Meds ordered this encounter  Medications  . sulfamethoxazole-trimethoprim (BACTRIM DS) 800-160 MG tablet    Sig: Take 1 tablet by mouth 2 (two) times daily for 7 days.    Dispense:  14 tablet    Refill:  0  . promethazine (PHENERGAN) 25 MG tablet    Sig: Take 1 tablet (25 mg total) by mouth every 8 (eight) hours as needed for nausea or vomiting.    Dispense:  30 tablet    Refill:  0     Follow-up: No follow-ups on file.  Scarlette Calico, MD

## 2020-07-16 DIAGNOSIS — R108A1 Right flank tenderness: Secondary | ICD-10-CM | POA: Insufficient documentation

## 2020-07-16 DIAGNOSIS — R10819 Abdominal tenderness, unspecified site: Secondary | ICD-10-CM | POA: Insufficient documentation

## 2020-07-16 DIAGNOSIS — N10 Acute tubulo-interstitial nephritis: Secondary | ICD-10-CM | POA: Insufficient documentation

## 2020-07-16 LAB — URINALYSIS, ROUTINE W REFLEX MICROSCOPIC
Bilirubin Urine: NEGATIVE
Ketones, ur: NEGATIVE
Nitrite: POSITIVE — AB
Specific Gravity, Urine: 1.01 (ref 1.000–1.030)
Total Protein, Urine: NEGATIVE
Urine Glucose: NEGATIVE
Urobilinogen, UA: 0.2 (ref 0.0–1.0)
pH: 7 (ref 5.0–8.0)

## 2020-07-16 MED ORDER — SULFAMETHOXAZOLE-TRIMETHOPRIM 800-160 MG PO TABS: 1.0000 | ORAL_TABLET | Freq: Two times a day (BID) | ORAL | 0 refills | Status: AC

## 2020-07-16 MED ORDER — PROMETHAZINE HCL 25 MG PO TABS: 25.0000 mg | ORAL_TABLET | Freq: Three times a day (TID) | ORAL | 0 refills | Status: DC | PRN

## 2020-07-16 NOTE — Addendum Note (Signed)
Addended by: Karle Barr on: 07/16/2020 02:22 PM   Modules accepted: Orders

## 2020-07-16 NOTE — Patient Instructions (Signed)
Urinary Tract Infection, Adult  A urinary tract infection (UTI) is an infection of any part of the urinary tract. The urinary tract includes the kidneys, ureters, bladder, and urethra. These organs make, store, and get rid of urine in the body. An upper UTI affects the ureters and kidneys. A lower UTI affects the bladder and urethra. What are the causes? Most urinary tract infections are caused by bacteria in your genital area around your urethra, where urine leaves your body. These bacteria grow and cause inflammation of your urinary tract. What increases the risk? You are more likely to develop this condition if:  You have a urinary catheter that stays in place.  You are not able to control when you urinate or have a bowel movement (incontinence).  You are female and you: ? Use a spermicide or diaphragm for birth control. ? Have low estrogen levels. ? Are pregnant.  You have certain genes that increase your risk.  You are sexually active.  You take antibiotic medicines.  You have a condition that causes your flow of urine to slow down, such as: ? An enlarged prostate, if you are female. ? Blockage in your urethra. ? A kidney stone. ? A nerve condition that affects your bladder control (neurogenic bladder). ? Not getting enough to drink, or not urinating often.  You have certain medical conditions, such as: ? Diabetes. ? A weak disease-fighting system (immunesystem). ? Sickle cell disease. ? Gout. ? Spinal cord injury. What are the signs or symptoms? Symptoms of this condition include:  Needing to urinate right away (urgency).  Frequent urination. This may include small amounts of urine each time you urinate.  Pain or burning with urination.  Blood in the urine.  Urine that smells bad or unusual.  Trouble urinating.  Cloudy urine.  Vaginal discharge, if you are female.  Pain in the abdomen or the lower back. You may also have:  Vomiting or a decreased  appetite.  Confusion.  Irritability or tiredness.  A fever or chills.  Diarrhea. The first symptom in older adults may be confusion. In some cases, they may not have any symptoms until the infection has worsened. How is this diagnosed? This condition is diagnosed based on your medical history and a physical exam. You may also have other tests, including:  Urine tests.  Blood tests.  Tests for STIs (sexually transmitted infections). If you have had more than one UTI, a cystoscopy or imaging studies may be done to determine the cause of the infections. How is this treated? Treatment for this condition includes:  Antibiotic medicine.  Over-the-counter medicines to treat discomfort.  Drinking enough water to stay hydrated. If you have frequent infections or have other conditions such as a kidney stone, you may need to see a health care provider who specializes in the urinary tract (urologist). In rare cases, urinary tract infections can cause sepsis. Sepsis is a life-threatening condition that occurs when the body responds to an infection. Sepsis is treated in the hospital with IV antibiotics, fluids, and other medicines. Follow these instructions at home: Medicines  Take over-the-counter and prescription medicines only as told by your health care provider.  If you were prescribed an antibiotic medicine, take it as told by your health care provider. Do not stop using the antibiotic even if you start to feel better. General instructions  Make sure you: ? Empty your bladder often and completely. Do not hold urine for long periods of time. ? Empty your bladder after   sex. ? Wipe from front to back after urinating or having a bowel movement if you are female. Use each tissue only one time when you wipe.  Drink enough fluid to keep your urine pale yellow.  Keep all follow-up visits. This is important.   Contact a health care provider if:  Your symptoms do not get better after 1-2  days.  Your symptoms go away and then return. Get help right away if:  You have severe pain in your back or your lower abdomen.  You have a fever or chills.  You have nausea or vomiting. Summary  A urinary tract infection (UTI) is an infection of any part of the urinary tract, which includes the kidneys, ureters, bladder, and urethra.  Most urinary tract infections are caused by bacteria in your genital area.  Treatment for this condition often includes antibiotic medicines.  If you were prescribed an antibiotic medicine, take it as told by your health care provider. Do not stop using the antibiotic even if you start to feel better.  Keep all follow-up visits. This is important. This information is not intended to replace advice given to you by your health care provider. Make sure you discuss any questions you have with your health care provider. Document Revised: 01/23/2020 Document Reviewed: 01/23/2020 Elsevier Patient Education  2021 [employer]  

## 2020-07-18 LAB — CULTURE, URINE COMPREHENSIVE

## 2020-07-19 ENCOUNTER — Other Ambulatory Visit (INDEPENDENT_AMBULATORY_CARE_PROVIDER_SITE_OTHER): Payer: Medicare HMO

## 2020-07-19 DIAGNOSIS — C3490 Malignant neoplasm of unspecified part of unspecified bronchus or lung: Secondary | ICD-10-CM | POA: Diagnosis not present

## 2020-07-19 DIAGNOSIS — I10 Essential (primary) hypertension: Secondary | ICD-10-CM | POA: Diagnosis not present

## 2020-07-19 DIAGNOSIS — R10819 Abdominal tenderness, unspecified site: Secondary | ICD-10-CM

## 2020-07-19 LAB — HEPATIC FUNCTION PANEL
ALT: 9 U/L (ref 0–35)
AST: 14 U/L (ref 0–37)
Albumin: 3.8 g/dL (ref 3.5–5.2)
Alkaline Phosphatase: 123 U/L — ABNORMAL HIGH (ref 39–117)
Bilirubin, Direct: 0.1 mg/dL (ref 0.0–0.3)
Total Bilirubin: 0.2 mg/dL (ref 0.2–1.2)
Total Protein: 7 g/dL (ref 6.0–8.3)

## 2020-07-19 LAB — CBC WITH DIFFERENTIAL/PLATELET
Basophils Absolute: 0.1 10*3/uL (ref 0.0–0.1)
Basophils Relative: 1.5 % (ref 0.0–3.0)
Eosinophils Absolute: 0.2 10*3/uL (ref 0.0–0.7)
Eosinophils Relative: 2.7 % (ref 0.0–5.0)
HCT: 33.5 % — ABNORMAL LOW (ref 36.0–46.0)
Hemoglobin: 11.2 g/dL — ABNORMAL LOW (ref 12.0–15.0)
Lymphocytes Relative: 13.2 % (ref 12.0–46.0)
Lymphs Abs: 0.8 10*3/uL (ref 0.7–4.0)
MCHC: 33.3 g/dL (ref 30.0–36.0)
MCV: 93.4 fl (ref 78.0–100.0)
Monocytes Absolute: 0.5 10*3/uL (ref 0.1–1.0)
Monocytes Relative: 8.7 % (ref 3.0–12.0)
Neutro Abs: 4.3 10*3/uL (ref 1.4–7.7)
Neutrophils Relative %: 73.9 % (ref 43.0–77.0)
Platelets: 277 10*3/uL (ref 150.0–400.0)
RBC: 3.59 Mil/uL — ABNORMAL LOW (ref 3.87–5.11)
RDW: 14.8 % (ref 11.5–15.5)
WBC: 5.8 10*3/uL (ref 4.0–10.5)

## 2020-07-19 LAB — BASIC METABOLIC PANEL
BUN: 10 mg/dL (ref 6–23)
CO2: 27 mEq/L (ref 19–32)
Calcium: 9.4 mg/dL (ref 8.4–10.5)
Chloride: 102 mEq/L (ref 96–112)
Creatinine, Ser: 1.24 mg/dL — ABNORMAL HIGH (ref 0.40–1.20)
GFR: 45.32 mL/min — ABNORMAL LOW (ref >60.00)
Glucose, Bld: 114 mg/dL — ABNORMAL HIGH (ref 70–99)
Potassium: 4.3 mEq/L (ref 3.5–5.1)
Sodium: 136 mEq/L (ref 135–145)

## 2020-07-19 LAB — LIPASE: Lipase: 26 U/L (ref 11.0–59.0)

## 2020-07-19 LAB — AMYLASE: Amylase: 37 U/L (ref 27–131)

## 2020-07-20 ENCOUNTER — Ambulatory Visit (INDEPENDENT_AMBULATORY_CARE_PROVIDER_SITE_OTHER)
Admission: RE | Admit: 2020-07-20 | Discharge: 2020-07-20 | Disposition: A | Discharge status: Home / Self Care | Payer: Medicare HMO | Source: Ambulatory Visit | Attending: Internal Medicine | Admitting: Internal Medicine

## 2020-07-20 ENCOUNTER — Other Ambulatory Visit: Payer: Self-pay

## 2020-07-20 DIAGNOSIS — I700 Atherosclerosis of aorta: Secondary | ICD-10-CM | POA: Diagnosis not present

## 2020-07-20 DIAGNOSIS — K3189 Other diseases of stomach and duodenum: Secondary | ICD-10-CM | POA: Diagnosis not present

## 2020-07-20 DIAGNOSIS — R10819 Abdominal tenderness, unspecified site: Secondary | ICD-10-CM | POA: Diagnosis not present

## 2020-07-20 DIAGNOSIS — N281 Cyst of kidney, acquired: Secondary | ICD-10-CM | POA: Diagnosis not present

## 2020-07-20 DIAGNOSIS — K6389 Other specified diseases of intestine: Secondary | ICD-10-CM | POA: Diagnosis not present

## 2020-07-20 MED ORDER — IOHEXOL 300 MG/ML  SOLN
80.0000 mL | Freq: Once | INTRAMUSCULAR | Status: AC | PRN
  Administered 2020-07-20: 80 mL via INTRAVENOUS

## 2020-07-22 ENCOUNTER — Telehealth: Payer: Self-pay

## 2020-07-22 ENCOUNTER — Encounter: Payer: Self-pay | Admitting: *Deleted

## 2020-07-22 ENCOUNTER — Encounter (HOSPITAL_COMMUNITY)
Admission: RE | Admit: 2020-07-22 | Discharge: 2020-07-22 | Disposition: A | Discharge status: Home / Self Care | Payer: Medicare HMO | Source: Ambulatory Visit | Attending: Hematology & Oncology | Admitting: Hematology & Oncology

## 2020-07-22 ENCOUNTER — Other Ambulatory Visit: Payer: Self-pay

## 2020-07-22 DIAGNOSIS — N281 Cyst of kidney, acquired: Secondary | ICD-10-CM | POA: Insufficient documentation

## 2020-07-22 DIAGNOSIS — C3490 Malignant neoplasm of unspecified part of unspecified bronchus or lung: Secondary | ICD-10-CM | POA: Diagnosis not present

## 2020-07-22 DIAGNOSIS — I517 Cardiomegaly: Secondary | ICD-10-CM | POA: Diagnosis not present

## 2020-07-22 DIAGNOSIS — I2510 Atherosclerotic heart disease of native coronary artery without angina pectoris: Secondary | ICD-10-CM | POA: Insufficient documentation

## 2020-07-22 LAB — GLUCOSE, CAPILLARY: Glucose-Capillary: 103 mg/dL — ABNORMAL HIGH (ref 70–99)

## 2020-07-22 MED ORDER — FLUDEOXYGLUCOSE F - 18 (FDG) INJECTION
8.6600 | Freq: Once | INTRAVENOUS | Status: AC | PRN
  Administered 2020-07-22: 8.66 via INTRAVENOUS

## 2020-07-22 NOTE — Telephone Encounter (Signed)
Approved  06/26/2020 - 06/25/2021,

## 2020-07-22 NOTE — Telephone Encounter (Signed)
Key: [id number]

## 2020-07-26 ENCOUNTER — Telehealth: Payer: Self-pay | Admitting: Hematology & Oncology

## 2020-07-26 ENCOUNTER — Other Ambulatory Visit: Payer: Self-pay

## 2020-07-26 ENCOUNTER — Encounter: Payer: Self-pay | Admitting: Hematology & Oncology

## 2020-07-26 ENCOUNTER — Inpatient Hospital Stay: Payer: Medicare HMO | Attending: Hematology & Oncology

## 2020-07-26 ENCOUNTER — Inpatient Hospital Stay: Payer: Medicare HMO | Admitting: Hematology & Oncology

## 2020-07-26 VITALS — BP 150/40 | HR 86 | Temp 98.6°F | Resp 24 | Wt 165.0 lb

## 2020-07-26 DIAGNOSIS — F17210 Nicotine dependence, cigarettes, uncomplicated: Secondary | ICD-10-CM | POA: Insufficient documentation

## 2020-07-26 DIAGNOSIS — R519 Headache, unspecified: Secondary | ICD-10-CM | POA: Diagnosis not present

## 2020-07-26 DIAGNOSIS — C3492 Malignant neoplasm of unspecified part of left bronchus or lung: Secondary | ICD-10-CM | POA: Insufficient documentation

## 2020-07-26 DIAGNOSIS — C3490 Malignant neoplasm of unspecified part of unspecified bronchus or lung: Secondary | ICD-10-CM

## 2020-07-26 DIAGNOSIS — J449 Chronic obstructive pulmonary disease, unspecified: Secondary | ICD-10-CM | POA: Insufficient documentation

## 2020-07-26 DIAGNOSIS — R69 Illness, unspecified: Secondary | ICD-10-CM | POA: Diagnosis not present

## 2020-07-26 LAB — CBC WITH DIFFERENTIAL (CANCER CENTER ONLY)
Abs Immature Granulocytes: 0.02 10*3/uL (ref 0.00–0.07)
Basophils Absolute: 0.1 10*3/uL (ref 0.0–0.1)
Basophils Relative: 1 %
Eosinophils Absolute: 0.2 10*3/uL (ref 0.0–0.5)
Eosinophils Relative: 4 %
HCT: 34.9 % — ABNORMAL LOW (ref 36.0–46.0)
Hemoglobin: 11.2 g/dL — ABNORMAL LOW (ref 12.0–15.0)
Immature Granulocytes: 0 %
Lymphocytes Relative: 19 %
Lymphs Abs: 1.2 10*3/uL (ref 0.7–4.0)
MCH: 31.3 pg (ref 26.0–34.0)
MCHC: 32.1 g/dL (ref 30.0–36.0)
MCV: 97.5 fL (ref 80.0–100.0)
Monocytes Absolute: 0.5 10*3/uL (ref 0.1–1.0)
Monocytes Relative: 8 %
Neutro Abs: 4.3 10*3/uL (ref 1.7–7.7)
Neutrophils Relative %: 68 %
Platelet Count: 297 10*3/uL (ref 150–400)
RBC: 3.58 MIL/uL — ABNORMAL LOW (ref 3.87–5.11)
RDW: 14 % (ref 11.5–15.5)
WBC Count: 6.3 10*3/uL (ref 4.0–10.5)
nRBC: 0 % (ref 0.0–0.2)

## 2020-07-26 LAB — CMP (CANCER CENTER ONLY)
ALT: 7 U/L (ref 0–44)
AST: 12 U/L — ABNORMAL LOW (ref 15–41)
Albumin: 3.9 g/dL (ref 3.5–5.0)
Alkaline Phosphatase: 118 U/L (ref 38–126)
Anion gap: 6 (ref 5–15)
BUN: 10 mg/dL (ref 8–23)
CO2: 31 mmol/L (ref 22–32)
Calcium: 9.9 mg/dL (ref 8.9–10.3)
Chloride: 99 mmol/L (ref 98–111)
Creatinine: 1.24 mg/dL — ABNORMAL HIGH (ref 0.44–1.00)
GFR, Estimated: 48 mL/min — ABNORMAL LOW (ref >60)
Glucose, Bld: 100 mg/dL — ABNORMAL HIGH (ref 70–99)
Potassium: 4.6 mmol/L (ref 3.5–5.1)
Sodium: 136 mmol/L (ref 135–145)
Total Bilirubin: 0.2 mg/dL — ABNORMAL LOW (ref 0.3–1.2)
Total Protein: 7.1 g/dL (ref 6.5–8.1)

## 2020-07-26 LAB — LACTATE DEHYDROGENASE: LDH: 133 U/L (ref 98–192)

## 2020-07-26 NOTE — Telephone Encounter (Signed)
Appointments scheduled calendar printed per 1/31 los/ This was a verbal request from Provider

## 2020-07-26 NOTE — Progress Notes (Signed)
Hematology and Oncology Follow Up Visit  Kaylee Hansen [id number] 1956-03-25 65 y.o. 07/26/2020.     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  As always, she comes in a wheelchair.  Her husband comes with her.  She recently had a PET scan done.  This was done on 07/22/2020.  Unfortunately, how much of the PET scan really was all that accurate.  There was no obvious new tumor.  There was no obvious progression of what she had in the left lingular area.  I think that she just has a hard time lying down flat because of her severe COPD.  I think that we can probably do CT scans from here on out.  Again, I know it is hard to get her to lie down for a while.  I think that we are just going to have to do CT scans because these are a lot quicker.  She is watching it with the snow.  She really is not going out anywhere.  She is still smoking.  She probably smokes 4-5 cigarettes a day.  She is eating.  I am not sure how good her appetite really is.  She really has had no problems with nausea or vomiting.  There is been no diarrhea.  She has had no bleeding.  Overall, I would say her performance status is no more than ECOG 2-3.    Medications:  Current Outpatient Medications:  .  albuterol (PROVENTIL HFA;VENTOLIN HFA) 108 (90 Base) MCG/ACT inhaler, Inhale 2 puffs into the lungs every 6 (six) hours as needed for wheezing or shortness of breath., Disp: 1 Inhaler, Rfl: 5 .  albuterol (PROVENTIL) (2.5 MG/3ML) 0.083% nebulizer solution, INHALE 1 VIAL BY NEBULIZATION EVERY 4 HOURS AS NEEDED FOR WHEEZING OR SHORTNESS OF BREATH., Disp: 150 mL, Rfl: 5 .  ALPRAZolam (XANAX) 0.25 MG tablet, Take 1 tablet (0.25 mg total) by mouth at bedtime as needed for anxiety., Disp: 90 tablet, Rfl: 1 .  bisacodyl (DULCOLAX) 5 MG EC tablet, Take 2 tablets (10 mg  total) by mouth at bedtime. (Patient taking differently: Take 10 mg by mouth daily as needed for moderate constipation.), Disp: 30 tablet, Rfl:  .  clobetasol (TEMOVATE) 0.05 % external solution, Apply 1 application topically 2 (two) times daily. For itchy scalp lesions, Disp: 50 mL, Rfl: 3 .  clopidogrel (PLAVIX) 75 MG tablet, TAKE 1 TABLET (75 MG TOTAL) BY MOUTH DAILY WITH BREAKFAST., Disp: 90 tablet, Rfl: 3 .  diltiazem (CARDIZEM CD) 240 MG 24 hr capsule, Take 1 capsule (240 mg total) by mouth daily., Disp: 90 capsule, Rfl: 3 .  diphenhydrAMINE HCl, Sleep, (ZZZQUIL) 25 MG CAPS, Take 50 mg by mouth at bedtime as needed (sleep)., Disp: , Rfl:  .  furosemide (LASIX) 20 MG tablet, Take 1 tablet by mouth once daily, Disp: 90 tablet, Rfl: 2 .  guaiFENesin (MUCINEX) 600 MG 12 hr tablet, Take 1 tablet (600 mg total) by mouth 2 (two) times daily as needed for cough or to loosen phlegm., Disp: 14 tablet, Rfl: 0 .  HYDROcodone-acetaminophen (NORCO) 10-325 MG tablet, Take 1 tablet by mouth every 6 (six) hours as needed for severe pain. Please fill on or after 06/19/20, Disp: 120 tablet, Rfl: 0 .  HYDROcodone-acetaminophen (NORCO) 10-325 MG tablet, Take 1 tablet by mouth every 6 (six)  hours as needed for up to 5 days for severe pain., Disp: 120 tablet, Rfl: 0 .  HYDROcodone-acetaminophen (NORCO) 10-325 MG tablet, Take 1 tablet by mouth every 6 (six) hours as needed for up to 5 days for severe pain., Disp: 120 tablet, Rfl: 0 .  hydroxypropyl methylcellulose / hypromellose (ISOPTO TEARS / GONIOVISC) 2.5 % ophthalmic solution, Place 1 drop into both eyes 3 (three) times daily as needed for dry eyes., Disp: , Rfl:  .  isosorbide mononitrate (IMDUR) 60 MG 24 hr tablet, Take 1 tablet (60 mg total) by mouth daily., Disp: 90 tablet, Rfl: 3 .  ketoconazole (NIZORAL) 2 % cream, APPLY TO AFFECTED AREA EVERY DAY, Disp: 45 g, Rfl: 1 .  levothyroxine (SYNTHROID) 150 MCG tablet, Take 1 tablet (150 mcg total) by mouth daily.,  Disp: 90 tablet, Rfl: 3 .  nitroGLYCERIN (NITROSTAT) 0.4 MG SL tablet, DISSOLVE ONE TABLET UNDER THE TONGUE EVERY 5 MINUTES AS NEEDED FOR CHEST PAIN.  DO NOT EXCEED A TOTAL OF 3 DOSES IN 15 MINUTES, Disp: 25 tablet, Rfl: 6 .  OXYGEN, Inhale 2.5 L/min into the lungs at bedtime., Disp: , Rfl:  .  pantoprazole (PROTONIX) 40 MG tablet, TAKE 1 TABLET BY MOUTH DAILY BEFORE BREAKFAST (Patient taking differently: Take 40 mg by mouth daily.), Disp: 90 tablet, Rfl: 3 .  PARoxetine (PAXIL) 40 MG tablet, TAKE 1 TABLET BY MOUTH EVERY DAY, Disp: 90 tablet, Rfl: 3 .  phenytoin (DILANTIN) 100 MG ER capsule, TAKE 100 MG EVERY MORNING AND 200 MG EVERY NIGHT., Disp: 270 capsule, Rfl: 3 .  promethazine (PHENERGAN) 25 MG tablet, Take 1 tablet (25 mg total) by mouth every 8 (eight) hours as needed for nausea or vomiting., Disp: 30 tablet, Rfl: 0 .  Respiratory Therapy Supplies (FLUTTER) DEVI, As directed, Disp: 1 each, Rfl: 0 .  rosuvastatin (CRESTOR) 20 MG tablet, Take 1 tablet (20 mg total) by mouth daily., Disp: 90 tablet, Rfl: 3 .  SUMAtriptan (IMITREX) 100 MG tablet, MAY REPEAT IN 2 HOURS IF HEADACHE PERSISTS OR RECURS., Disp: 9 tablet, Rfl: 3 .  traZODone (DESYREL) 100 MG tablet, TAKE 1 TABLET BY MOUTH EVERYDAY AT BEDTIME, Disp: 90 tablet, Rfl: 3  Allergies:  Allergies  Allergen Reactions  . Nitrofuran Derivatives Other (See Comments)    confusion  . Oxycodone Hcl Other (See Comments)    Knocked her out for 6 days  . Morphine Other (See Comments)    "Makes me go crazy."   . Penicillins Hives    Has patient had a PCN reaction causing immediate rash, facial/tongue/throat swelling, SOB or lightheadedness with hypotension: unknown Has patient had a PCN reaction causing severe rash involving mucus membranes or skin necrosis: unknown Has patient had a PCN reaction that required hospitalization No Has patient had a PCN reaction occurring within the last 10 years: No If all of the above answers are "NO", then may  proceed with Cephalosporin use.  Other reaction(s): Rash-Generalized  . Tape Other (See Comments)    Skin turns red and burns    Past Medical History, Surgical history, Social history, and Family History were reviewed and updated.  Review of Systems: Review of Systems  Constitutional: Positive for fatigue.  HENT:  Negative.   Eyes: Negative.   Respiratory: Positive for cough.   Cardiovascular: Positive for chest pain.  Gastrointestinal: Negative.   Endocrine: Negative.   Genitourinary: Negative.    Musculoskeletal: Positive for arthralgias and myalgias.  Skin: Negative.   Neurological: Negative.   Hematological:  Negative.   Psychiatric/Behavioral: Negative.     Physical Exam:  weight is 165 lb (74.8 kg). Her oral temperature is 98.6 F (37 C). Her blood pressure is 150/40 (abnormal) and her pulse is 86. Her respiration is 24 (abnormal) and oxygen saturation is 94%.   Wt Readings from Last 3 Encounters:  07/26/20 165 lb (74.8 kg)  07/15/20 173 lb (78.5 kg)  05/27/20 171 lb 6.4 oz (77.7 kg)    Physical Exam Vitals reviewed.  HENT:     Head: Normocephalic and atraumatic.  Eyes:     Pupils: Pupils are equal, round, and reactive to light.  Cardiovascular:     Rate and Rhythm: Normal rate and regular rhythm.     Heart sounds: Normal heart sounds.     Comments: Cardiac exam shows a regular rate and rhythm.  She has no murmurs, rubs or bruits. Pulmonary:     Effort: Pulmonary effort is normal.     Breath sounds: Normal breath sounds.     Comments: Pulmonary exam shows scattered rhonchi bilaterally.  She has some wheezes.  She has a decent air movement. Abdominal:     General: Bowel sounds are normal.     Palpations: Abdomen is soft.  Musculoskeletal:        General: No tenderness or deformity. Normal range of motion.     Cervical back: Normal range of motion.  Lymphadenopathy:     Cervical: No cervical adenopathy.  Skin:    General: Skin is warm and dry.      Findings: No erythema or rash.     Comments: Skin exam shows scattered ecchymoses.  Neurological:     Mental Status: She is alert and oriented to person, place, and time.  Psychiatric:        Behavior: Behavior normal.        Thought Content: Thought content normal.        Judgment: Judgment normal.      Lab Results  Component Value Date   WBC 6.3 07/26/2020   HGB 11.2 (L) 07/26/2020   HCT 34.9 (L) 07/26/2020   MCV 97.5 07/26/2020   PLT 297 07/26/2020     Chemistry      Component Value Date/Time   NA 136 07/26/2020 1128   NA 144 07/08/2018 1059   K 4.6 07/26/2020 1128   CL 99 07/26/2020 1128   CO2 31 07/26/2020 1128   BUN 10 07/26/2020 1128   BUN 12 07/08/2018 1059   CREATININE 1.24 (H) 07/26/2020 1128      Component Value Date/Time   CALCIUM 9.9 07/26/2020 1128   ALKPHOS 118 07/26/2020 1128   AST 12 (L) 07/26/2020 1128   ALT 7 07/26/2020 1128   BILITOT 0.2 (L) 07/26/2020 1128       Impression and Plan: Ms. Hogston is a 65 year old white female.  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  I feel that she is responding to the SBRT.    At this point, I do not think we have to do another PET scan.  We can do a CT scan.  I forgot to mention that she is complaint of headaches.  She has had headaches for several years.  Her last CT of the head was back in 2020.  This was unremarkable.  We can definitely do another one just to make sure there is nothing going on.  I would like to  see her back in the springtime.  We will try to move her appointments out further given the fact that she has a hard time coming in here.    [name], MD 1/31/20221:21 PM

## 2020-08-03 ENCOUNTER — Encounter: Payer: Self-pay | Admitting: *Deleted

## 2020-08-03 ENCOUNTER — Other Ambulatory Visit: Payer: Self-pay

## 2020-08-03 ENCOUNTER — Ambulatory Visit (HOSPITAL_BASED_OUTPATIENT_CLINIC_OR_DEPARTMENT_OTHER)
Admission: RE | Admit: 2020-08-03 | Discharge: 2020-08-03 | Disposition: A | Discharge status: Home / Self Care | Payer: Medicare HMO | Source: Ambulatory Visit | Attending: Hematology & Oncology | Admitting: Hematology & Oncology

## 2020-08-03 DIAGNOSIS — C3490 Malignant neoplasm of unspecified part of unspecified bronchus or lung: Secondary | ICD-10-CM | POA: Insufficient documentation

## 2020-08-03 DIAGNOSIS — G319 Degenerative disease of nervous system, unspecified: Secondary | ICD-10-CM | POA: Diagnosis not present

## 2020-08-03 DIAGNOSIS — R519 Headache, unspecified: Secondary | ICD-10-CM | POA: Diagnosis not present

## 2020-08-11 ENCOUNTER — Ambulatory Visit: Payer: Medicare HMO | Admitting: Internal Medicine

## 2020-08-11 ENCOUNTER — Encounter: Payer: Self-pay | Admitting: Internal Medicine

## 2020-08-11 VITALS — BP 120/70 | HR 74 | Ht 65.0 in | Wt 167.0 lb

## 2020-08-11 DIAGNOSIS — R10819 Abdominal tenderness, unspecified site: Secondary | ICD-10-CM

## 2020-08-11 DIAGNOSIS — K5904 Chronic idiopathic constipation: Secondary | ICD-10-CM | POA: Diagnosis not present

## 2020-08-11 MED ORDER — LACTULOSE 10 GM/15ML PO SOLN: 20.0000 g | Freq: Every day | ORAL | 5 refills | Status: DC

## 2020-08-11 NOTE — Progress Notes (Signed)
Kaylee Hansen 65 y.o. [birth year] [id number]  Assessment & Plan:   Encounter Diagnoses  Name Primary?  . Chronic idiopathic constipation Yes  . Abdominal tenderness in right flank    I am going to try lactulose to see if that makes a difference.  She should take that every day and continue to take 3 Dulcolax every other night.  Hopefully this provide a better bowel regimen.  Understandably the patient is frustrated she has to take many medications and her quality of life is not good in such a state of health.  Unfortunately I think this is the best we can do.  I will see her back in follow-up on October 05, 2020. Meds ordered this encounter  Medications  . lactulose (CHRONULAC) 10 GM/15ML solution    Sig: Take 30 mLs (20 g total) by mouth daily.    Dispense:  946 mL    Refill:  5   CC: Plotnikov, [name], MD   Subjective:   Chief Complaint: Constipation abdominal pain  HPI Kaylee Hansen is here with her husband Rex with complaints of bloating and abdominal discomfort and incomplete defecation with constipation.  There is associated nausea.  She tends to wait until she feels miserable and then take 3 Dulcolax and 3 stool softeners.  This regimen is not working too well for her.  MiraLAX has not helped.  This is a chronic recurrent problem for her.  She wishes she did not have to take something she says.  She was last seen by me in February 2021 with dysphagia.  She has terrible severe COPD and is on home oxygen.  Endoscopic evaluation and treatment was not an option.  Barium swallow showed dysmotility small sliding hiatal hernia and eccentric puckering of the lower thoracic esophageal lumen which the barium tablet lodged at transiently.  The radiologist postulated this could represent a borderline peptic stricture.  Again she was not able to be scoped due to comorbidities.  She is seeing Dr. Marin Olp now she has a left lingular nodule likely bronchogenic carcinoma she finished radiation  therapy for that in May 2021.  She is still smoking some.  PET scanning in September 2021 demonstrated decreased size of the noduleThe scan was repeated in January of this year but because she could not maintain her position and be still the quality of exam was severely downgraded a CT of the abdomen and pelvis in January demonstrated a moderate volume of stool but no diverticulitis or any acute findings" previously seen site of soft tissue fullness within the pyloric channel less prominent on today's study"  Allergies  Allergen Reactions  . Nitrofuran Derivatives Other (See Comments)    confusion  . Oxycodone Hcl Other (See Comments)    Knocked her out for 6 days  . Morphine Other (See Comments)    "Makes me go crazy."   . Penicillins Hives    Has patient had a PCN reaction causing immediate rash, facial/tongue/throat swelling, SOB or lightheadedness with hypotension: unknown Has patient had a PCN reaction causing severe rash involving mucus membranes or skin necrosis: unknown Has patient had a PCN reaction that required hospitalization No Has patient had a PCN reaction occurring within the last 10 years: No If all of the above answers are "NO", then may proceed with Cephalosporin use.  Other reaction(s): Rash-Generalized  . Tape Other (See Comments)    Skin turns red and burns   Current Meds  Medication Sig  . albuterol (PROVENTIL HFA;VENTOLIN HFA) 108 (  90 Base) MCG/ACT inhaler Inhale 2 puffs into the lungs every 6 (six) hours as needed for wheezing or shortness of breath.  [name] albuterol (PROVENTIL) (2.5 MG/3ML) 0.083% nebulizer solution INHALE 1 VIAL BY NEBULIZATION EVERY 4 HOURS AS NEEDED FOR WHEEZING OR SHORTNESS OF BREATH.  [name] ALPRAZolam (XANAX) 0.25 MG tablet Take 1 tablet (0.25 mg total) by mouth at bedtime as needed for anxiety.  . bisacodyl (DULCOLAX) 5 MG EC tablet Take 2 tablets (10 mg total) by mouth at bedtime. (Patient taking differently: Take 10 mg by mouth daily as needed  for moderate constipation.)  . clobetasol (TEMOVATE) 0.05 % external solution Apply 1 application topically 2 (two) times daily. For itchy scalp lesions  . clopidogrel (PLAVIX) 75 MG tablet TAKE 1 TABLET (75 MG TOTAL) BY MOUTH DAILY WITH BREAKFAST.  [name] diltiazem (CARDIZEM CD) 240 MG 24 hr capsule Take 1 capsule (240 mg total) by mouth daily.  . diphenhydrAMINE HCl, Sleep, (ZZZQUIL) 25 MG CAPS Take 50 mg by mouth at bedtime as needed (sleep).  . furosemide (LASIX) 20 MG tablet Take 1 tablet by mouth once daily  . guaiFENesin (MUCINEX) 600 MG 12 hr tablet Take 1 tablet (600 mg total) by mouth 2 (two) times daily as needed for cough or to loosen phlegm.  [name] HYDROcodone-acetaminophen (NORCO) 10-325 MG tablet Take 1 tablet by mouth every 6 (six) hours as needed for severe pain. Please fill on or after 06/19/20  . hydroxypropyl methylcellulose / hypromellose (ISOPTO TEARS / GONIOVISC) 2.5 % ophthalmic solution Place 1 drop into both eyes 3 (three) times daily as needed for dry eyes.  . isosorbide mononitrate (IMDUR) 60 MG 24 hr tablet Take 1 tablet (60 mg total) by mouth daily.  [name] ketoconazole (NIZORAL) 2 % cream APPLY TO AFFECTED AREA EVERY DAY  . lactulose (CHRONULAC) 10 GM/15ML solution Take 30 mLs (20 g total) by mouth daily.  [name] levothyroxine (SYNTHROID) 150 MCG tablet Take 1 tablet (150 mcg total) by mouth daily.  . nitroGLYCERIN (NITROSTAT) 0.4 MG SL tablet DISSOLVE ONE TABLET UNDER THE TONGUE EVERY 5 MINUTES AS NEEDED FOR CHEST PAIN.  DO NOT EXCEED A TOTAL OF 3 DOSES IN 15 MINUTES  . OXYGEN Inhale 2.5 L/min into the lungs at bedtime.  . pantoprazole (PROTONIX) 40 MG tablet TAKE 1 TABLET BY MOUTH DAILY BEFORE BREAKFAST (Patient taking differently: Take 40 mg by mouth daily.)  . PARoxetine (PAXIL) 40 MG tablet TAKE 1 TABLET BY MOUTH EVERY DAY  . phenytoin (DILANTIN) 100 MG ER capsule TAKE 100 MG EVERY MORNING AND 200 MG EVERY NIGHT.  [name] promethazine (PHENERGAN) 25 MG tablet Take 1 tablet (25 mg total)  by mouth every 8 (eight) hours as needed for nausea or vomiting.  [name] Respiratory Therapy Supplies (FLUTTER) DEVI As directed  . rosuvastatin (CRESTOR) 20 MG tablet Take 1 tablet (20 mg total) by mouth daily.  . SUMAtriptan (IMITREX) 100 MG tablet MAY REPEAT IN 2 HOURS IF HEADACHE PERSISTS OR RECURS.  . traZODone (DESYREL) 100 MG tablet TAKE 1 TABLET BY MOUTH EVERYDAY AT BEDTIME   Past Medical History:  Diagnosis Date  . Acute respiratory failure following trauma and surgery ([place]) 08/26/2012  . Anxiety   . Aortic insufficiency with aortic stenosis 04/2017   TTE December 2019: Mild AS with severe regurgitation.  Mild LA dilation.;;  TEE January 2016: Severe-type III aortic regurgitation (holodiastolic flow reversal in the a sending aorta & vena contracta =6.   . CAD S/P percutaneous coronary angioplasty 11/2017  Proximal RCA PCI Synergy DES 3.5 mm x 16 mm (3.8 mm). Ost-mLM 30%. Ost-prox Cx 40%.  . Collagenous colitis   . Colon adenomas 2011  . Constipation    Chronic abdominal pain and constipation  . COPD (chronic obstructive pulmonary disease) ([place])   . Depression   . Diverticulosis   . Esophageal stricture 11/08/2012   Ulcer noted in 2010  . GERD (gastroesophageal reflux disease)   . Helicobacter pylori gastritis 2010   Pylera Tx  . History of cholecystectomy   . Hx of appendectomy   . Hx of cancer of lung 1999  . Hx of hysterectomy   . Hyperlipidemia   . Hypothyroidism   . Iron deficiency anemia due to chronic blood loss 10/01/2019  . Low back pain   . Non-STEMI (non-ST elevated myocardial infarction) ([place]) 11/2017   RCA PCI  . Osteoarthritis of knee    bilateral knee  . Seizures ([place])   . Stroke [facility])    CVA, hx of 97  . 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 ABDOMINAL HYSTERECTOMY     complete 1992  . ABDOMINAL SURGERY     Exploratory  . APPENDECTOMY    . BALLOON DILATION N/A 11/08/2012   Procedure: BALLOON DILATION;  Surgeon:  Gatha Mayer, MD;  Location: [facility] ENDOSCOPY;  Service: Endoscopy;  Laterality: N/A;  . CHOLECYSTECTOMY    . COLONOSCOPY W/ BIOPSIES     multiple  . CORONARY STENT INTERVENTION N/A 06/28/2018   Procedure: CORONARY STENT INTERVENTION;  Surgeon: Martinique, Peter M, MD;  Location: [place] CV LAB;  Service: Cardiovascular;  Laterality: N/A;  90% prox RCA -> PCI with Synergy DES 3.5 mm x 16 mm (3.8 mm).  . CT CTA CORONARY W/CA SCORE W/CM &/OR WO/CM  05/2017   Coronary calcium score 2.9. Intermediate risk. Unusual noncalcified plaque in RCA --FFR negative  . ESOPHAGOGASTRODUODENOSCOPY     w/baloon x 2  . ESOPHAGOGASTRODUODENOSCOPY N/A 11/08/2012   Procedure: ESOPHAGOGASTRODUODENOSCOPY (EGD);  Surgeon: Gatha Mayer, MD;  Location: [facility];  Service: Endoscopy;  Laterality: N/A;  . ESOPHAGOGASTRODUODENOSCOPY (EGD) WITH PROPOFOL N/A 08/25/2019   Procedure: ESOPHAGOGASTRODUODENOSCOPY (EGD) WITH PROPOFOL;  Surgeon: Gatha Mayer, MD;  Location: [facility] ENDOSCOPY;  Service: Endoscopy;  Laterality: N/A;  . LEFT HEART CATH AND CORONARY ANGIOGRAPHY N/A 06/28/2018   Procedure: LEFT HEART CATH AND CORONARY ANGIOGRAPHY;  Surgeon: Martinique, Peter M, MD;  Location: [place] [facility];  Service: Cardiovascular:  90% prox RCA -> DES PCI. Ost-mLM 30%. Ost-prox Cx 40%.   EF 55-65%. LVEDP 15 mmHg.   [name] MALONEY DILATION  08/25/2019   Procedure: MALONEY DILATION;  Surgeon: Gatha Mayer, MD;  Location: [facility];  Service: Endoscopy;;  . RIGHT HEART CATH N/A 07/11/2018   Procedure: RIGHT HEART CATH;  Surgeon: Leonie Man, MD;  Location: [place] CV LAB;;   Relatively normal Right Heart Cath pressures: PA pressure 26/14 mmHg-mean 20 mmHg; PCWP 13 mmHg.  RAP 6 mmHg, RVP 28/3 mmHg-EDP 10 mmHg. Severely reduced cardiac output and index by Fick: 3.63 and 2.07.  . TEE WITHOUT CARDIOVERSION N/A 07/11/2018   Procedure: TRANSESOPHAGEAL ECHOCARDIOGRAM (TEE);  Surgeon: [name], MD;  Location: [facility] ENDOSCOPY;  Service:  Cardiology;;  Severe aortic regurgitation-type III. (suggested by holodiastolic flow reversal and descending aorta-vena contracta 6 mm).  . TOTAL HIP ARTHROPLASTY     Left  . TRANSTHORACIC ECHOCARDIOGRAM  05/2018   EF 60-65%.  No R WMA.  GR 1 DD.  Mild aortic stenosis with severe regurgitation.  Mild MR..  Only mild LV dilation noted.  . TRANSTHORACIC ECHOCARDIOGRAM  12/2018    EF 60-65%. Mild LVH. Gr1 DD. Mod AoV thickening. Mod-Severe AI, ~ mlid AS.   Social History   Social History Narrative   Married   2 children   No regular exercise         family history includes Coronary artery disease in her mother and another family member; Diabetes in her mother and son; Hypertension in her father; Kidney cancer in her sister; Kidney disease in an other family member.   Review of Systems See HPI  Objective:   Physical Exam BP 120/70   Pulse 74   Ht 5\' 5"  (1.651 m)   Wt 167 lb (75.8 kg)   SpO2 93%   BMI 27.79 kg/m  Chronically ill, frail Lungs insp/exp wheezes poor air mvt Ht NL abd obese, distended w/ small epigastric hernia BS +, mildly tender R LQ Rectal exam was performed in the presence of Patti Martinique, CMA No significant stool, no impaction normal resting anal tone

## 2020-08-11 NOTE — Patient Instructions (Signed)
We have sent the following medications to your pharmacy for you to pick up at your convenience: Lactulose  Per Dr Carlean Purl take three over the counter Dulcolax every other night. You may take the stool softener if you want to but Dr Carlean Purl said you don't have to.  I appreciate the opportunity to care for you. Silvano Rusk, MD, [facility]

## 2020-08-18 ENCOUNTER — Telehealth: Payer: Self-pay | Admitting: Internal Medicine

## 2020-08-18 DIAGNOSIS — R300 Dysuria: Secondary | ICD-10-CM

## 2020-08-18 NOTE — Telephone Encounter (Signed)
Patient called and said that she is still having pain and burning when urinating. She seen Dr. Ronnald Ramp on 1.20.2022 for the issue and was wondering if something else could be called in. Please advise

## 2020-08-19 DIAGNOSIS — C3490 Malignant neoplasm of unspecified part of unspecified bronchus or lung: Secondary | ICD-10-CM | POA: Diagnosis not present

## 2020-08-20 NOTE — Addendum Note (Signed)
Addended by: Earnstine Regal on: 08/20/2020 04:10 PM   Modules accepted: Orders

## 2020-08-20 NOTE — Telephone Encounter (Signed)
We can get a UA and culture. Thanks

## 2020-08-20 NOTE — Telephone Encounter (Signed)
Notified pt/husband with MD response. Pt will come in Monday to have done.Kaylee KitchenJohny Chess

## 2020-08-23 ENCOUNTER — Other Ambulatory Visit (INDEPENDENT_AMBULATORY_CARE_PROVIDER_SITE_OTHER): Payer: Medicare HMO

## 2020-08-23 DIAGNOSIS — R300 Dysuria: Secondary | ICD-10-CM | POA: Diagnosis not present

## 2020-08-23 LAB — URINALYSIS, ROUTINE W REFLEX MICROSCOPIC
Bilirubin Urine: NEGATIVE
Ketones, ur: NEGATIVE
Leukocytes,Ua: NEGATIVE
Nitrite: NEGATIVE
Specific Gravity, Urine: 1.025 (ref 1.000–1.030)
Total Protein, Urine: NEGATIVE
Urine Glucose: NEGATIVE
Urobilinogen, UA: 0.2 (ref 0.0–1.0)
pH: 6 (ref 5.0–8.0)

## 2020-08-26 ENCOUNTER — Encounter: Payer: Self-pay | Admitting: Internal Medicine

## 2020-08-26 ENCOUNTER — Other Ambulatory Visit: Payer: Self-pay | Admitting: Internal Medicine

## 2020-08-26 ENCOUNTER — Ambulatory Visit (INDEPENDENT_AMBULATORY_CARE_PROVIDER_SITE_OTHER): Payer: Medicare HMO | Admitting: Internal Medicine

## 2020-08-26 ENCOUNTER — Other Ambulatory Visit: Payer: Self-pay

## 2020-08-26 DIAGNOSIS — J441 Chronic obstructive pulmonary disease with (acute) exacerbation: Secondary | ICD-10-CM

## 2020-08-26 DIAGNOSIS — R52 Pain, unspecified: Secondary | ICD-10-CM | POA: Diagnosis not present

## 2020-08-26 DIAGNOSIS — F17210 Nicotine dependence, cigarettes, uncomplicated: Secondary | ICD-10-CM

## 2020-08-26 DIAGNOSIS — E559 Vitamin D deficiency, unspecified: Secondary | ICD-10-CM | POA: Diagnosis not present

## 2020-08-26 DIAGNOSIS — R69 Illness, unspecified: Secondary | ICD-10-CM | POA: Diagnosis not present

## 2020-08-26 DIAGNOSIS — C3410 Malignant neoplasm of upper lobe, unspecified bronchus or lung: Secondary | ICD-10-CM | POA: Diagnosis not present

## 2020-08-26 MED ORDER — PANTOPRAZOLE SODIUM 40 MG PO TBEC: 40.0000 mg | DELAYED_RELEASE_TABLET | Freq: Every day | ORAL | 3 refills | Status: DC

## 2020-08-26 MED ORDER — METHYLPREDNISOLONE 4 MG PO TBPK: ORAL_TABLET | ORAL | 0 refills | Status: DC

## 2020-08-26 MED ORDER — ALPRAZOLAM 0.25 MG PO TABS: 0.2500 mg | ORAL_TABLET | Freq: Every evening | ORAL | 1 refills | Status: DC | PRN

## 2020-08-26 MED ORDER — HYDROCODONE-ACETAMINOPHEN 10-325 MG PO TABS: 1.0000 | ORAL_TABLET | Freq: Four times a day (QID) | ORAL | 0 refills | Status: DC | PRN

## 2020-08-26 MED ORDER — METHYLPREDNISOLONE ACETATE 80 MG/ML IJ SUSP
80.0000 mg | Freq: Once | INTRAMUSCULAR | Status: AC
Start: 2020-08-26 — End: 2020-08-26
  Administered 2020-08-26: 80 mg via INTRAMUSCULAR

## 2020-08-26 NOTE — Assessment & Plan Note (Signed)
On Norco prn

## 2020-08-26 NOTE — Assessment & Plan Note (Signed)
Needs O2 prn F/u w/Dr Marin Olp

## 2020-08-26 NOTE — Assessment & Plan Note (Signed)
On Vit D 

## 2020-08-26 NOTE — Progress Notes (Signed)
Subjective:  Patient ID: Kaylee Hansen, female    DOB: 10/22/1955  Age: 65 y.o. MRN: [id number]  CC: Follow-up (3 MONTH F/U)   HPI Kaylee Hansen presents for COPD, lung cancer, anxiety, chronic pain f/u  Outpatient Medications Prior to Visit  Medication Sig Dispense Refill  . albuterol (PROVENTIL) (2.5 MG/3ML) 0.083% nebulizer solution INHALE 1 VIAL BY NEBULIZATION EVERY 4 HOURS AS NEEDED FOR WHEEZING OR SHORTNESS OF BREATH. 150 mL 5  . ALPRAZolam (XANAX) 0.25 MG tablet Take 1 tablet (0.25 mg total) by mouth at bedtime as needed for anxiety. 90 tablet 1  . bisacodyl (DULCOLAX) 5 MG EC tablet Take 2 tablets (10 mg total) by mouth at bedtime. (Patient taking differently: Take 10 mg by mouth daily as needed for moderate constipation.) 30 tablet   . clobetasol (TEMOVATE) 0.05 % external solution Apply 1 application topically 2 (two) times daily. For itchy scalp lesions 50 mL 3  . clopidogrel (PLAVIX) 75 MG tablet TAKE 1 TABLET (75 MG TOTAL) BY MOUTH DAILY WITH BREAKFAST. 90 tablet 3  . diphenhydrAMINE HCl, Sleep, (ZZZQUIL) 25 MG CAPS Take 50 mg by mouth at bedtime as needed (sleep).    . furosemide (LASIX) 20 MG tablet Take 1 tablet by mouth once daily 90 tablet 2  . guaiFENesin (MUCINEX) 600 MG 12 hr tablet Take 1 tablet (600 mg total) by mouth 2 (two) times daily as needed for cough or to loosen phlegm. 14 tablet 0  . HYDROcodone-acetaminophen (NORCO) 10-325 MG tablet Take 1 tablet by mouth every 6 (six) hours as needed for severe pain. Please fill on or after 06/19/20 120 tablet 0  . hydroxypropyl methylcellulose / hypromellose (ISOPTO TEARS / GONIOVISC) 2.5 % ophthalmic solution Place 1 drop into both eyes 3 (three) times daily as needed for dry eyes.    [name] ketoconazole (NIZORAL) 2 % cream APPLY TO AFFECTED AREA EVERY DAY 45 g 1  . lactulose (CHRONULAC) 10 GM/15ML solution Take 30 mLs (20 g total) by mouth daily. 946 mL 5  . levothyroxine (SYNTHROID) 150 MCG tablet TAKE 1 TABLET BY MOUTH  EVERY DAY 90 tablet 3  . nitroGLYCERIN (NITROSTAT) 0.4 MG SL tablet DISSOLVE ONE TABLET UNDER THE TONGUE EVERY 5 MINUTES AS NEEDED FOR CHEST PAIN.  DO NOT EXCEED A TOTAL OF 3 DOSES IN 15 MINUTES 25 tablet 6  . OXYGEN Inhale 2.5 L/min into the lungs at bedtime.    . pantoprazole (PROTONIX) 40 MG tablet TAKE 1 TABLET BY MOUTH DAILY BEFORE BREAKFAST (Patient taking differently: Take 40 mg by mouth daily.) 90 tablet 3  . PARoxetine (PAXIL) 40 MG tablet TAKE 1 TABLET BY MOUTH EVERY DAY 90 tablet 3  . phenytoin (DILANTIN) 100 MG ER capsule TAKE 100 MG EVERY MORNING AND 200 MG EVERY NIGHT. 270 capsule 3  . promethazine (PHENERGAN) 25 MG tablet Take 1 tablet (25 mg total) by mouth every 8 (eight) hours as needed for nausea or vomiting. 30 tablet 0  . Respiratory Therapy Supplies (FLUTTER) DEVI As directed 1 each 0  . SUMAtriptan (IMITREX) 100 MG tablet MAY REPEAT IN 2 HOURS IF HEADACHE PERSISTS OR RECURS. 9 tablet 3  . traZODone (DESYREL) 100 MG tablet TAKE 1 TABLET BY MOUTH EVERYDAY AT BEDTIME 90 tablet 3  . albuterol (PROVENTIL HFA;VENTOLIN HFA) 108 (90 Base) MCG/ACT inhaler Inhale 2 puffs into the lungs every 6 (six) hours as needed for wheezing or shortness of breath. 1 Inhaler 5  . diltiazem (CARDIZEM CD) 240 MG 24  hr capsule Take 1 capsule (240 mg total) by mouth daily. 90 capsule 3  . isosorbide mononitrate (IMDUR) 60 MG 24 hr tablet Take 1 tablet (60 mg total) by mouth daily. 90 tablet 3  . rosuvastatin (CRESTOR) 20 MG tablet Take 1 tablet (20 mg total) by mouth daily. 90 tablet 3   No facility-administered medications prior to visit.    ROS: Review of Systems  Constitutional: Positive for fatigue. Negative for activity change, appetite change, chills and unexpected weight change.  HENT: Negative for congestion, mouth sores and sinus pressure.   Eyes: Negative for visual disturbance.  Respiratory: Positive for cough, shortness of breath and wheezing. Negative for chest tightness.    Cardiovascular: Negative for chest pain and palpitations.  Gastrointestinal: Negative for abdominal pain and nausea.  Genitourinary: Negative for difficulty urinating, frequency and vaginal pain.  Musculoskeletal: Positive for arthralgias, back pain and gait problem.  Skin: Negative for pallor and rash.  Neurological: Positive for weakness. Negative for dizziness, tremors, numbness and headaches.  Psychiatric/Behavioral: Positive for decreased concentration, dysphoric mood and sleep disturbance. Negative for confusion and suicidal ideas. The patient is nervous/anxious.     Objective:  BP (!) 128/52 (BP Location: Left Arm)   Pulse 84   Temp 98.9 F (37.2 C) (Oral)   Ht 5\' 5"  (1.651 m)   Wt 167 lb (75.8 kg)   SpO2 92%   BMI 27.79 kg/m   BP Readings from Last 3 Encounters:  08/26/20 (!) 128/52  08/11/20 120/70  07/26/20 (!) 150/40    Wt Readings from Last 3 Encounters:  08/26/20 167 lb (75.8 kg)  08/11/20 167 lb (75.8 kg)  07/26/20 165 lb (74.8 kg)    Physical Exam Constitutional:      General: She is not in acute distress.    Appearance: She is well-developed. She is obese. She is not ill-appearing.  HENT:     Head: Normocephalic.     Right Ear: External ear normal.     Left Ear: External ear normal.     Nose: Nose normal.     Mouth/Throat:     Mouth: Oropharynx is clear and moist.  Eyes:     General:        Right eye: No discharge.        Left eye: No discharge.     Conjunctiva/sclera: Conjunctivae normal.     Pupils: Pupils are equal, round, and reactive to light.  Neck:     Thyroid: No thyromegaly.     Vascular: No JVD.     Trachea: No tracheal deviation.  Cardiovascular:     Rate and Rhythm: Normal rate and regular rhythm.     Heart sounds: Murmur heard.    Pulmonary:     Effort: No respiratory distress.     Breath sounds: No stridor. Wheezing and rhonchi present. No rales.  Abdominal:     General: Bowel sounds are normal. There is no distension.      Palpations: Abdomen is soft. There is no mass.     Tenderness: There is no abdominal tenderness. There is no guarding or rebound.  Musculoskeletal:        General: Tenderness present. No edema.     Cervical back: Normal range of motion and neck supple.  Lymphadenopathy:     Cervical: No cervical adenopathy.  Skin:    Findings: No erythema or rash.  Neurological:     Cranial Nerves: No cranial nerve deficit.     Motor: No abnormal  muscle tone.     Coordination: Coordination normal.     Deep Tendon Reflexes: Reflexes normal.  Psychiatric:        Mood and Affect: Mood and affect normal.        Behavior: Behavior normal.        Thought Content: Thought content normal.        Judgment: Judgment normal.   in a w/c Audible wheezing  Lab Results  Component Value Date   WBC 6.3 07/26/2020   HGB 11.2 (L) 07/26/2020   HCT 34.9 (L) 07/26/2020   PLT 297 07/26/2020   GLUCOSE 100 (H) 07/26/2020   CHOL 128 06/29/2018   TRIG 184 (H) 06/29/2018   HDL 35 (L) 06/29/2018   LDLDIRECT 187.0 07/14/2015   LDLCALC 56 06/29/2018   ALT 7 07/26/2020   AST 12 (L) 07/26/2020   NA 136 07/26/2020   K 4.6 07/26/2020   CL 99 07/26/2020   CREATININE 1.24 (H) 07/26/2020   BUN 10 07/26/2020   CO2 31 07/26/2020   TSH 1.84 01/01/2019   INR 0.9 09/22/2019   HGBA1C  09/17/2008    5.0 (NOTE)   The ADA recommends the following therapeutic goal for glycemic   control related to Hgb A1C measurement:   Goal of Therapy:   < 7.0% Hgb A1C   Reference: American Diabetes Association: Clinical Practice   Recommendations 2008, Diabetes Care,  2008, 31:(Suppl 1).    CT Head Wo Contrast  Result Date: 08/03/2020 CLINICAL DATA:  Headache.  History lung cancer EXAM: CT HEAD WITHOUT CONTRAST TECHNIQUE: Contiguous axial images were obtained from the base of the skull through the vertex without intravenous contrast. COMPARISON:  CT head 05/02/2020 FINDINGS: Brain: Moderate atrophy. Negative for hydrocephalus. Mild white  matter changes, chronic and unchanged. Negative for acute infarct, hemorrhage, mass. 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 This is a diffuse process and similar to CT of 06/28/2018, with progression since 2015 CT. No large or dominant destructive lesion identified. Sinuses/Orbits: Paranasal sinuses clear. Bilateral cataract extraction Other: None IMPRESSION: Generalized atrophy.  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 Uncertain etiology. Pattern is not typical for metastatic disease or myeloma. Correlate with CBC. Electronically Signed   By: Franchot Gallo M.D.   On: 08/03/2020 10:56    Assessment & Plan:   Walker Kehr, MD

## 2020-08-26 NOTE — Assessment & Plan Note (Signed)
8 cigs a day - needs to quit

## 2020-08-26 NOTE — Assessment & Plan Note (Signed)
Depo-medrol Dosepac Medrol

## 2020-08-26 NOTE — Addendum Note (Signed)
Addended by: Earnstine Regal on: 08/26/2020 12:09 PM   Modules accepted: Orders

## 2020-08-29 ENCOUNTER — Telehealth: Payer: Self-pay | Admitting: Internal Medicine

## 2020-08-30 MED ORDER — METHYLPREDNISOLONE 4 MG PO TBPK: ORAL_TABLET | ORAL | 0 refills | Status: DC

## 2020-08-30 NOTE — Telephone Encounter (Signed)
[facility] called   Received the order for the methylPREDNISolone 4 MG .   Since the patient currently taking phenytoin (DILANTIN) 100 MG ER capsule they need documentation to prescribe the methylPREDNISolone (MEDROL DOSEPAK) 4 MG TBPK tablet due the potential adverse side affects.   Please resend the order with documentation stating approval or denial for the order.

## 2020-08-30 NOTE — Telephone Encounter (Signed)
Ok Thx 

## 2020-08-30 NOTE — Addendum Note (Signed)
Addended by: Cassandria Anger on: 08/30/2020 01:25 PM   Modules accepted: Orders

## 2020-08-31 ENCOUNTER — Telehealth: Payer: Self-pay | Admitting: Hematology & Oncology

## 2020-08-31 NOTE — Telephone Encounter (Signed)
Rescheduled appts for 3/28 due to provider on call.  Letter & updated calendar was mailed

## 2020-09-01 ENCOUNTER — Telehealth: Payer: Self-pay | Admitting: Internal Medicine

## 2020-09-01 DIAGNOSIS — R300 Dysuria: Secondary | ICD-10-CM

## 2020-09-01 NOTE — Telephone Encounter (Signed)
Kaylee Hansen w/ [employer] called and said that the patient is hurting when urinating and her urine has an odor. She was wondering if a UA could be placed. Please advise. The patient can be reached at [phone]

## 2020-09-03 ENCOUNTER — Other Ambulatory Visit (INDEPENDENT_AMBULATORY_CARE_PROVIDER_SITE_OTHER): Payer: Medicare HMO

## 2020-09-03 ENCOUNTER — Other Ambulatory Visit: Payer: Self-pay

## 2020-09-03 DIAGNOSIS — R300 Dysuria: Secondary | ICD-10-CM

## 2020-09-03 LAB — URINALYSIS, ROUTINE W REFLEX MICROSCOPIC
Bilirubin Urine: NEGATIVE
Ketones, ur: NEGATIVE
Leukocytes,Ua: NEGATIVE
Nitrite: NEGATIVE
Specific Gravity, Urine: 1.01 (ref 1.000–1.030)
Total Protein, Urine: NEGATIVE
Urine Glucose: NEGATIVE
Urobilinogen, UA: 0.2 (ref 0.0–1.0)
pH: 6.5 (ref 5.0–8.0)

## 2020-09-03 NOTE — Telephone Encounter (Signed)
Okay UA and culture.  Thanks

## 2020-09-03 NOTE — Telephone Encounter (Signed)
Notified pt/husband w/MD response. Place order and added to lab appt for today.Marland KitchenJohny Chess

## 2020-09-03 NOTE — Addendum Note (Signed)
Addended by: Earnstine Regal on: 09/03/2020 10:42 AM   Modules accepted: Orders

## 2020-09-06 NOTE — Telephone Encounter (Signed)
Notified [facility] spoke w/Mia gave MD response.Marland KitchenJohny Hansen

## 2020-09-06 NOTE — Telephone Encounter (Signed)
Pharmacy called wanting to know if its ok to fill the Medrol Pack. There is an med interaction with pt Dilantin pls advise.Marland KitchenJohny Chess

## 2020-09-06 NOTE — Telephone Encounter (Signed)
Ok to fill 

## 2020-09-14 DIAGNOSIS — I69351 Hemiplegia and hemiparesis following cerebral infarction affecting right dominant side: Secondary | ICD-10-CM | POA: Diagnosis not present

## 2020-09-14 DIAGNOSIS — I110 Hypertensive heart disease with heart failure: Secondary | ICD-10-CM | POA: Diagnosis not present

## 2020-09-14 DIAGNOSIS — I4891 Unspecified atrial fibrillation: Secondary | ICD-10-CM | POA: Diagnosis not present

## 2020-09-14 DIAGNOSIS — D6869 Other thrombophilia: Secondary | ICD-10-CM | POA: Diagnosis not present

## 2020-09-14 DIAGNOSIS — E039 Hypothyroidism, unspecified: Secondary | ICD-10-CM | POA: Diagnosis not present

## 2020-09-14 DIAGNOSIS — J9690 Respiratory failure, unspecified, unspecified whether with hypoxia or hypercapnia: Secondary | ICD-10-CM | POA: Diagnosis not present

## 2020-09-14 DIAGNOSIS — I509 Heart failure, unspecified: Secondary | ICD-10-CM | POA: Diagnosis not present

## 2020-09-14 DIAGNOSIS — J449 Chronic obstructive pulmonary disease, unspecified: Secondary | ICD-10-CM | POA: Diagnosis not present

## 2020-09-14 DIAGNOSIS — I25119 Atherosclerotic heart disease of native coronary artery with unspecified angina pectoris: Secondary | ICD-10-CM | POA: Diagnosis not present

## 2020-09-14 DIAGNOSIS — G40909 Epilepsy, unspecified, not intractable, without status epilepticus: Secondary | ICD-10-CM | POA: Diagnosis not present

## 2020-09-16 ENCOUNTER — Ambulatory Visit: Payer: Medicare HMO | Admitting: Cardiology

## 2020-09-16 ENCOUNTER — Encounter: Payer: Self-pay | Admitting: Cardiology

## 2020-09-16 ENCOUNTER — Other Ambulatory Visit: Payer: Self-pay

## 2020-09-16 VITALS — BP 139/63 | HR 84 | Ht 66.0 in | Wt 166.0 lb

## 2020-09-16 DIAGNOSIS — I10 Essential (primary) hypertension: Secondary | ICD-10-CM | POA: Diagnosis not present

## 2020-09-16 DIAGNOSIS — I25119 Atherosclerotic heart disease of native coronary artery with unspecified angina pectoris: Secondary | ICD-10-CM

## 2020-09-16 DIAGNOSIS — I214 Non-ST elevation (NSTEMI) myocardial infarction: Secondary | ICD-10-CM | POA: Diagnosis not present

## 2020-09-16 DIAGNOSIS — I351 Nonrheumatic aortic (valve) insufficiency: Secondary | ICD-10-CM | POA: Diagnosis not present

## 2020-09-16 DIAGNOSIS — Z955 Presence of coronary angioplasty implant and graft: Secondary | ICD-10-CM | POA: Diagnosis not present

## 2020-09-16 DIAGNOSIS — Z993 Dependence on wheelchair: Secondary | ICD-10-CM | POA: Diagnosis not present

## 2020-09-16 DIAGNOSIS — Z720 Tobacco use: Secondary | ICD-10-CM | POA: Diagnosis not present

## 2020-09-16 DIAGNOSIS — E785 Hyperlipidemia, unspecified: Secondary | ICD-10-CM | POA: Diagnosis not present

## 2020-09-16 DIAGNOSIS — C3490 Malignant neoplasm of unspecified part of unspecified bronchus or lung: Secondary | ICD-10-CM | POA: Diagnosis not present

## 2020-09-16 MED ORDER — DILTIAZEM HCL ER COATED BEADS 360 MG PO CP24: 360.0000 mg | ORAL_CAPSULE | Freq: Every day | ORAL | 3 refills | Status: DC

## 2020-09-16 MED ORDER — FUROSEMIDE 20 MG PO TABS: 20.0000 mg | ORAL_TABLET | Freq: Every day | ORAL | 3 refills | Status: DC

## 2020-09-16 NOTE — Progress Notes (Signed)
Primary Care Provider: Cassandria Anger, MD Cardiologist: [name], MD Electrophysiologist: None  Clinic Note: Chief Complaint  Patient presents with  . Follow-up    6 months.  Stable from cardiac standpoint.  Nursing notes breathing issues, sharp chest pains with coughing.  . Coronary Artery Disease    No angina or heart failure  . Cardiac Valve Problem    Aortic stenosis with more significant aortic vegetation.  Echo follow-up   ===================================  ASSESSMENT/PLAN   Problem List Items Addressed This Visit    Wheelchair dependence    With, question how well she would be able to tolerate any open surgery.  If anything, perhaps a TAVR would be available for aortic insufficiency.  As present it does not.  Continue treat medically.      Severe Stage C1 aortic regurgitation by prior echocardiography - Primary (Chronic)    Reassuring findings on echo.  Valve seems to be stable.  Does have some atrial dilation.  At present, it is really hard to tell whether symptoms warrant referral for the TAVR.  Unfortunately, we are likely to lose her CT surgeon as he is moving. TAVR has not been considered to be an option for aortic insufficiency as of yet. Also, she has had stable findings with no real changes to left ventricle size.  For now we will simply continue to monitor.    She and her husband do not seem to be all that often interested in the idea of invasive procedures.  Unless TAVR can be improved, she would not be interested in surgery.      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  No further angina.  PCI in RCA, she remains on Plavix maintenance therapy.  Okay to hold for procedures or surgeries.  Not on beta-blocker for unclear reasons for COPD, using diltiazem which were  increased to 60 mg daily. Stable dose of Imdur.  Lipid management will become an issue.      Relevant Medications   diltiazem (CARDIZEM CD) 360 MG 24 hr capsule   furosemide (LASIX) 20 MG tablet   Other Relevant Orders   EKG 12-Lead (Completed)   Hyperlipidemia with target LDL less than 70 (Chronic)    Unfortunate, she is intolerant of most statins has been able to tolerate rosuvastatin.  She has had labs checked by PCP which I have not seen.  Will defer to PCP.  Target LDL should be less than 70.  May require additional therapy.      Relevant Medications   diltiazem (CARDIZEM CD) 360 MG 24 hr capsule   furosemide (LASIX) 20 MG tablet   Essential hypertension, benign (Chronic)    Borderline blood pressure today, Plan: Increase diltiazem to 360 mg daily.  Hopefully that will will affect blood pressure change, otherwise will need to then start considering reinitiation ACE inhibitor/ARB for afterload reduction.      Relevant Medications   diltiazem (CARDIZEM CD) 360 MG 24 hr capsule   furosemide (LASIX) 20 MG tablet   Other Relevant Orders   EKG 12-Lead (Completed)   Tobacco abuse disorder (Chronic)    No luck n trying to talk her into sh.      NSTEMI (non-ST elevated myocardial infarction) ([place]) (Chronic)    2 years after non-STEMI with RCA PCI.  Normal EF. Continues on stable cardiac medications standard dose of Plavix She has not had any NTG requirement glycerin. She is on stable dose of Imdur  Not on beta-blocker because of COPD, she is on diltiazem which will increase Imdur 60 mg daily.   Continue statin      Relevant Medications   diltiazem (CARDIZEM CD) 360 MG 24 hr capsule   furosemide (LASIX) 20 MG tablet   Presence of drug coated stent in right coronary artery (Chronic)    Over 2 years out from her PCI.  She is now on Plavix for secondary prevention   Okay to hold Plavix 5 days preop for surgeries or procedures.      Relevant Orders   EKG 12-Lead  (Completed)     ===================================  HPI:    Kaylee Hansen is a 65 y.o. female with a complicated medical history summarized below who presents today for 42-monthfollow-up of.  PMH:   Severe COPD (GOLD IV)  Chronic dyspnea  Still smoking -> indicated that she has had a hard time ever quitting.   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  Not felt to be surgical candidate. => 3 rounds of XRT with plan for possible chemotherapy.  1997: CVA - with h/o Sz d/o  CAD-NSTEMI 06/28/2018: DES PCI-prox RCA 90%Synery DES 3.5 x 16 (3.8 mm); Ost-mid LM 30%, Ost-prox LCx 40%. LVEDP 15 mmHg  04/2017 - Mild AS with Severe (Type III) AI   TTE 07/11/2018: Severe aortic regurgitation-type III. (suggested by holodiastolic flow reversal and descending aorta-vena contracta 6 mm  RHC 07/11/2018: Relatively normal Right Heart Cath pressures: PA pressure 26/14 mmHg-mean 20 mmHg; PCWP 13 mmHg.  RAP 6 mmHg, RVP 28/3 mmHg-EDP 10 mmHg. Severely reduced cardiac output and index by Fick: 3.63 and 2.07. => Per Dr. ORoxy Manns has appearance of rheumatic disease  CVTS Consult 07/19/2018: Extensive comorbidities noted.  Most notably her being wheelchair-bound and continued smoking. => Indicated it is difficult to tell what is truly causing her symptoms with severe COPD, ongoing tobacco abuse and physical debilitation.  Not sure how much the valve is contributing.  Despite the fact that she would probably benefit from valve replacement, he felt that the conventional approach with surgical AVR would be extremely high risk because her comorbidities.  Considered CTA of chest abdomen pelvis to determine if TAVR will be an option.   Severe Orthopedic problems - L hip & knee => s/p Total Hip, but did poorly after => now unable to walk more than 2-3 steps without falling.  Mostly wheelchair-bound.  Has chronic pain  Zitlali L HThielkewas last seen on  April 08, 2020, noted that she had several hospitalizations for esophageal dilation as well as COPD exacerbations.  She had chest pain in the MRI suite.  Also had issues with severe hip pain. => Chest CT scan revealed recurrence of lung cancer in the left lingula.  With multiple hospitalizations, any medications and stopped.  There was thought that she should probably be on CPAP at home, she did not tolerate it.  She was seen in September 2021 with episode of chest pain felt to be costochondritis. => We will increase her Imdur up to 100 mg daily because of long episode of chest pain.  Still noting intermittent palpitations with chest pain off and on while seated.  Made worse by coughing, and transferring on and off the wheelchair (  felt to be MSK/costochondritis).  Has chronic cough and dyspnea.  No fevers chills.  Stable leg swelling.  Not using extra oxygen except for at night.  Was having processes of coughing mostly in the morning associated dyspnea.  Dyspnea worse at night and with activity.  Also noting back and joint pains.  Dizziness and leg weakness.  Diltiazem dose increased to 240 mg daily with plans to decrease Imdur to 60 mg daily.  Was given steroid Dosepak for costochondritis.  Recent Hospitalizations: none  Reviewed  CV studies:    The following studies were reviewed today: (if available, images/films reviewed: From Epic Chart or Care Everywhere) . TTE 06/28/2020: EF 60 to 65%.  GR 1 DD.  No RWM A.  Normal RV?[name]  Normal mitral valve.  No MR MI.  Moderate aortic valve calcification/thickness: Moderate to severe AI with mild AS.  (AI PHT 218 ms.  Mean AS gradient 14 mmHg).  Stable, no change  Interval History:   Kaylee Hansen returns here today for follow-up along with her husband.  She is a wheelchair.  She is still using oxygen as needed, not chronically.  Apparently is an issue with her thyroid levels etc.  From a cardiac standpoint she is not having any palpitation spells.  No  chest pain to suggest her previous anginal type pain.  She has diffuse sharp chest pains with inspiration that occur every now and then.  Sometimes it lasts 10 to 15 minutes sometimes that will last more than a minute.  This seems much more like the pain she had last fall much less like the pain she had with her MI.  She really has not noticed any syncope or near syncope or palpitation symptoms.  She really is having edema.  She barely uses her Lasix, sometimes forgets it.  CV Review of Symptoms (Summary): positive for - chest pain, dyspnea on exertion, irregular heartbeat, shortness of breath and Musculoskeletal chest pain.  Baseline dyspnea with no recent exacerbation. negative for - edema, irregular heartbeat, orthopnea, paroxysmal nocturnal dyspnea or Syncope or near syncope, TIA/amaurosis fugax or claudication.  The patient does have symptoms concerning for COVID-19 infection (fever, chills, cough, or new shortness of breath).   REVIEWED OF SYSTEMS   Review of Systems  Constitutional: Positive for malaise/fatigue (G.  Mostly because she does not do things.). Negative for weight loss.  Respiratory: Positive for cough (Chronic), shortness of breath (Baseline) and wheezing. Negative for sputum production.   Cardiovascular: Negative for leg swelling.  Gastrointestinal: Positive for abdominal pain and constipation (Alternating constipation and diarrhea).  Genitourinary: Negative for frequency and hematuria.  Musculoskeletal: Positive for back pain, joint pain and myalgias. Negative for falls.  Neurological: Positive for dizziness, tingling (Radicular pain from back pain in the legs.), sensory change (Reduce pedal sensation) and headaches (She takes something routinely).  Psychiatric/Behavioral: Positive for depression (At least this time) and memory loss. The patient is nervous/anxious and has insomnia.     Fatigue, cough, shortness of breath and wheezing.  Back pain gait issues, hip pain.   Generalized weakness. Poor concentration, memory loss, sleep disturbance, anxious and dysthymia   I have reviewed and (if needed) personally updated the patient's problem list, medications, allergies, past medical and surgical history, social and family history.   PAST MEDICAL HISTORY   Past Medical History:  Diagnosis Date  . Acute respiratory failure following trauma and surgery ([place]) 08/26/2012  . Anxiety   . Aortic insufficiency with aortic stenosis 04/2017  TTE December 2019: Mild AS with severe regurgitation.  Mild LA dilation.;;  TEE January 2016: Severe-type III aortic regurgitation (holodiastolic flow reversal in the a sending aorta & vena contracta =6.   . CAD S/P percutaneous coronary angioplasty 11/2017   Proximal RCA PCI Synergy DES 3.5 mm x 16 mm (3.8 mm). Ost-mLM 30%. Ost-prox Cx 40%.  . Collagenous colitis   . Colon adenomas 2011  . Constipation    Chronic abdominal pain and constipation  . COPD (chronic obstructive pulmonary disease) ([place])   . Depression   . Diverticulosis   . Esophageal stricture 11/08/2012   Ulcer noted in 2010  . GERD (gastroesophageal reflux disease)   . Helicobacter pylori gastritis 2010   Pylera Tx  . History of cholecystectomy   . Hx of appendectomy   . Hx of cancer of lung 1999  . Hx of hysterectomy   . Hyperlipidemia   . Hypothyroidism   . Iron deficiency anemia due to chronic blood loss 10/01/2019  . Low back pain   . Non-STEMI (non-ST elevated myocardial infarction) ([place]) 06/2018   RCA PCI  . Osteoarthritis of knee    bilateral knee  . Seizures ([place])   . Stroke [facility])    CVA, hx of 97  . 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 ABDOMINAL HYSTERECTOMY     complete 1992  . ABDOMINAL SURGERY     Exploratory  . APPENDECTOMY    . BALLOON DILATION N/A 11/08/2012   Procedure: BALLOON DILATION;  Surgeon: Gatha Mayer, MD;  Location: [facility] ENDOSCOPY;  Service:  Endoscopy;  Laterality: N/A;  . CHOLECYSTECTOMY    . COLONOSCOPY W/ BIOPSIES     multiple  . CORONARY STENT INTERVENTION N/A 06/28/2018   Procedure: CORONARY STENT INTERVENTION;  Surgeon: Martinique, Peter M, MD;  Location: [place] [facility];  Service: Cardiovascular;  Laterality: N/A;  90% prox RCA -> PCI with Synergy DES 3.5 mm x 16 mm (3.8 mm).  . CT CTA CORONARY W/CA SCORE W/CM &/OR WO/CM  05/2017   Coronary calcium score 2.9. Intermediate risk. Unusual noncalcified plaque in RCA --FFR negative  . ESOPHAGOGASTRODUODENOSCOPY     w/baloon x 2  . ESOPHAGOGASTRODUODENOSCOPY N/A 11/08/2012   Procedure: ESOPHAGOGASTRODUODENOSCOPY (EGD);  Surgeon: Gatha Mayer, MD;  Location: [facility];  Service: Endoscopy;  Laterality: N/A;  . ESOPHAGOGASTRODUODENOSCOPY (EGD) WITH PROPOFOL N/A 08/25/2019   Procedure: ESOPHAGOGASTRODUODENOSCOPY (EGD) WITH PROPOFOL;  Surgeon: Gatha Mayer, MD;  Location: [facility];  Service: Endoscopy;  Laterality: N/A;  . LEFT HEART CATH AND CORONARY ANGIOGRAPHY N/A 06/28/2018   Procedure: LEFT HEART CATH AND CORONARY ANGIOGRAPHY;  Surgeon: Martinique, Peter M, MD;  Location: [place] [facility];  Service: Cardiovascular:  90% prox RCA -> DES PCI. Ost-mLM 30%. Ost-prox Cx 40%.   EF 55-65%. LVEDP 15 mmHg.   [name] MALONEY DILATION  08/25/2019   Procedure: MALONEY DILATION;  Surgeon: Gatha Mayer, MD;  Location: [facility];  Service: Endoscopy;;  . RIGHT HEART CATH N/A 07/11/2018   Procedure: RIGHT HEART CATH;  Surgeon: Leonie Man, MD;  Location: [place] CV LAB;;   Relatively normal Right Heart Cath pressures: PA pressure 26/14 mmHg-mean 20 mmHg; PCWP 13 mmHg.  RAP 6 mmHg, RVP 28/3 mmHg-EDP 10 mmHg. Severely reduced cardiac output and index by Fick: 3.63 and 2.07.  . TEE WITHOUT CARDIOVERSION N/A 07/11/2018   Procedure: TRANSESOPHAGEAL ECHOCARDIOGRAM (TEE);  Surgeon:  [name], MD;  Location: [facility] ENDOSCOPY;  Service: Cardiology;;  Severe aortic regurgitation-type III.  (suggested by holodiastolic flow reversal and descending aorta-vena contracta 6 mm).  . TOTAL HIP ARTHROPLASTY     Left  . TRANSTHORACIC ECHOCARDIOGRAM  05/2018   EF 60-65%.  No R WMA.  GR 1 DD.  Mild aortic stenosis with severe regurgitation.  Mild MR..  Only mild LV dilation noted.  . TRANSTHORACIC ECHOCARDIOGRAM  12/2018    EF 60-65%. Mild LVH. Gr1 DD. Mod AoV thickening. Mod-Severe AI, ~ mlid AS.   {Post PCI Angiogram) 06/2018     Immunization History  Administered Date(s) Administered  . Fluad Quad(high Dose 65+) 04/03/2019, 04/29/2020  . Influenza Split 03/27/2012  . Influenza Whole 05/08/2007, 04/08/2009, 03/24/2011  . Influenza, High Dose Seasonal PF 04/04/2017  . Influenza,inj,Quad PF,6+ Mos 03/05/2013, 05/01/2014, 03/05/2015, 04/17/2016, 03/13/2018  . PFIZER(Purple Top)SARS-COV-2 Vaccination 08/19/2019, 09/09/2019, 05/17/2020  . Pneumococcal Conjugate-13 07/04/2016  . Pneumococcal Polysaccharide-23 07/14/2015  . Td 04/13/2016  . Tdap 07/14/2015    MEDICATIONS/ALLERGIES   Current Meds  Medication Sig  . albuterol (PROVENTIL) (2.5 MG/3ML) 0.083% nebulizer solution INHALE 1 VIAL BY NEBULIZATION EVERY 4 HOURS AS NEEDED FOR WHEEZING OR SHORTNESS OF BREATH.  [name] ALPRAZolam (XANAX) 0.25 MG tablet Take 1 tablet (0.25 mg total) by mouth at bedtime as needed for anxiety.  . bisacodyl (DULCOLAX) 5 MG EC tablet Take 2 tablets (10 mg total) by mouth at bedtime. (Patient taking differently: Take 10 mg by mouth daily as needed.)  . clobetasol (TEMOVATE) 0.05 % external solution Apply 1 application topically 2 (two) times daily. For itchy scalp lesions  . clopidogrel (PLAVIX) 75 MG tablet TAKE 1 TABLET (75 MG TOTAL) BY MOUTH DAILY WITH BREAKFAST.  [name] diltiazem (CARDIZEM CD) 360 MG 24 hr capsule Take 1 capsule (360 mg total) by mouth daily.  . diphenhydrAMINE HCl, Sleep, (ZZZQUIL) 25 MG CAPS Take 50 mg by mouth at bedtime as needed (sleep).  [name] guaiFENesin (MUCINEX) 600 MG 12 hr tablet  Take 1 tablet (600 mg total) by mouth 2 (two) times daily as needed for cough or to loosen phlegm.  [name] HYDROcodone-acetaminophen (NORCO) 10-325 MG tablet Take 1 tablet by mouth every 6 (six) hours as needed for severe pain. Please fill on or after 10/17/20  . HYDROcodone-acetaminophen (NORCO) 10-325 MG tablet Take 1 tablet by mouth every 6 (six) hours as needed for severe pain. Please fill on or after 11/16/20  . hydroxypropyl methylcellulose / hypromellose (ISOPTO TEARS / GONIOVISC) 2.5 % ophthalmic solution Place 1 drop into both eyes 3 (three) times daily as needed for dry eyes.  [name] ketoconazole (NIZORAL) 2 % cream APPLY TO AFFECTED AREA EVERY DAY  . lactulose (CHRONULAC) 10 GM/15ML solution Take 30 mLs (20 g total) by mouth daily.  [name] levothyroxine (SYNTHROID) 150 MCG tablet TAKE 1 TABLET BY MOUTH EVERY DAY  . nitroGLYCERIN (NITROSTAT) 0.4 MG SL tablet DISSOLVE ONE TABLET UNDER THE TONGUE EVERY 5 MINUTES AS NEEDED FOR CHEST PAIN.  DO NOT EXCEED A TOTAL OF 3 DOSES IN 15 MINUTES (Patient not taking: Reported on 09/21/2020)  . OXYGEN Inhale 2.5 L/min into the lungs at bedtime.  . pantoprazole (PROTONIX) 40 MG tablet Take 1 tablet (40 mg total) by mouth daily before breakfast.  . PARoxetine (PAXIL) 40 MG tablet TAKE 1 TABLET BY MOUTH EVERY DAY  . phenytoin (DILANTIN) 100 MG ER capsule TAKE 100 MG EVERY MORNING AND 200 MG EVERY NIGHT.  [name] promethazine (PHENERGAN) 25 MG  tablet Take 1 tablet (25 mg total) by mouth every 8 (eight) hours as needed for nausea or vomiting.  [name] Respiratory Therapy Supplies (FLUTTER) DEVI As directed  . SUMAtriptan (IMITREX) 100 MG tablet MAY REPEAT IN 2 HOURS IF HEADACHE PERSISTS OR RECURS.  . traZODone (DESYREL) 100 MG tablet TAKE 1 TABLET BY MOUTH EVERYDAY AT BEDTIME  . [DISCONTINUED] furosemide (LASIX) 20 MG tablet Take 1 tablet by mouth once daily  . [DISCONTINUED] methylPREDNISolone (MEDROL DOSEPAK) 4 MG TBPK tablet TAKE 6 TABLETS ON DAY 1 AS DIRECTED ON PACKAGE AND DECREASE  BY 1 TAB EACH DAY FOR A TOTAL OF 6 DAYS Dx COPD (Patient not taking: Reported on 09/21/2020)    Allergies  Allergen Reactions  . Nitrofuran Derivatives Other (See Comments)    confusion  . Oxycodone Hcl Other (See Comments)    Knocked her out for 6 days  . Morphine Other (See Comments)    "Makes me go crazy."   . Penicillins Hives    Has patient had a PCN reaction causing immediate rash, facial/tongue/throat swelling, SOB or lightheadedness with hypotension: unknown Has patient had a PCN reaction causing severe rash involving mucus membranes or skin necrosis: unknown Has patient had a PCN reaction that required hospitalization No Has patient had a PCN reaction occurring within the last 10 years: No If all of the above answers are "NO", then may proceed with Cephalosporin use.  Other reaction(s): Rash-Generalized  . Tape Other (See Comments)    Skin turns red and burns    SOCIAL HISTORY/FAMILY HISTORY   Reviewed in Epic:  Pertinent findings:  Social History   Tobacco Use  . Smoking status: Current Every Day Smoker    Packs/day: 0.50    Years: 48.00    Pack years: 24.00    Types: Cigarettes  . Smokeless tobacco: Never Used  . Tobacco comment: 09/21/20 8cigs/day.  Vaping Use  . Vaping Use: Never used  Substance Use Topics  . Alcohol use: No  . Drug use: No   Social History   Social History Narrative   Married   2 children   No regular exercise          OBJCTIVE -PE, EKG, labs   Wt Readings from Last 3 Encounters:  09/21/20 167 lb (75.8 kg)  09/16/20 166 lb (75.3 kg)  08/26/20 167 lb (75.8 kg)    Physical Exam: BP 139/63   Pulse 84   Ht _0  (1.676 m)   Wt 166 lb (75.3 kg)   SpO2 97%   BMI 26.79 kg/m  Physical Exam Vitals reviewed.  Constitutional:      General: She is not in acute distress.    Appearance: She is normal weight. She is ill-appearing (Chronically ill-appearing, wheelchair-bound woman appears older than stated age.  Thick smell of  cigarettes.). She is not toxic-appearing.  HENT:     Head: Normocephalic and atraumatic.  Neck:     Vascular: Carotid bruit (Soft bilateral bruits versus referred AS murmur.) present. No hepatojugular reflux.  Cardiovascular:     Rate and Rhythm: Normal rate and regular rhythm. Occasional extrasystoles are present.    Chest Wall: PMI is not displaced.     Pulses: Decreased pulses (Decreased palpable pedal pulses.).     Heart sounds: S1 normal and S2 normal. Heart sounds are distant. Murmur heard.   Harsh crescendo-decrescendo midsystolic murmur is present with a grade of 2/6 at the upper right sternal border radiating to the neck. High-pitched blowing decrescendo middiastolic  murmur is present with a grade of 2/4 at the upper right sternal border and upper left sternal border radiating to the apex. No friction rub. No gallop.   Pulmonary:     Effort: No respiratory distress.     Breath sounds: No stridor. Rhonchi present. No wheezing (Diffuse upper and lower airways.  Mostly expiratory) or rales.  Chest:     Chest wall: Tenderness (Diffuse costosternal tenderness) present.  Musculoskeletal:     Cervical back: Normal range of motion and neck supple.  Skin:    Comments: tough/thick skin  Neurological:     General: No focal deficit present.     Mental Status: She is alert and oriented to person, place, and time.     Comments: Slow mentation- takes a while for her to answer questions.  Psychiatric:     Comments: Flat/blunted ffect.  Depressed mood, although she seems happier than usual. Relatively poor historian.  A lot of questions were answered by her husband.       Adult ECG Report  Rate: 84 ;  Rhythm: normal sinus rhythm and LVH.  RSR", incomplete RBBB (question pulmonary pattern).  Lateral MI and inferior MI, age-indeterminate.;   Narrative Interpretation: Stable  Recent Labs: No recent labs available.  Being checked by PCP   Lab Results  Component Value Date   CHOL 128  06/29/2018   HDL 35 (L) 06/29/2018   LDLCALC 56 06/29/2018   LDLDIRECT 187.0 07/14/2015   TRIG 184 (H) 06/29/2018   CHOLHDL 3.7 06/29/2018   Lab Results  Component Value Date   CREATININE 0.99 09/21/2020   BUN 11 09/21/2020   NA 140 09/21/2020   K 4.2 09/21/2020   CL 101 09/21/2020   CO2 33 (H) 09/21/2020   CBC Latest Ref Rng & Units 09/21/2020 07/26/2020 07/19/2020  WBC 4.0 - 10.5 K/uL 7.0 6.3 5.8  Hemoglobin 12.0 - 15.0 g/dL 10.7(L) 11.2(L) 11.2(L)  Hematocrit 36.0 - 46.0 % 34.1(L) 34.9(L) 33.5(L)  Platelets 150 - 400 K/uL 298 297 277.0    Lab Results  Component Value Date   TSH 1.84 01/01/2019    ==================================================  COVID-19 Education: The signs and symptoms of COVID-19 were discussed with the patient and how to seek care for testing (follow up with PCP or arrange E-visit).   The importance of social distancing and COVID-19 vaccination was discussed today. The patient is practicing social distancing & Masking.   I spent a total of 67mnutes with the patient spent in direct patient consultation.  Additional time spent with chart review  / charting (studies, outside notes, etc): 17 min ->  Total Time: 41 min   Current medicines are reviewed at length with the patient today.  (+/- concerns) none  This visit occurred during the SARS-CoV-2 public health emergency.  Safety protocols were in place, including screening questions prior to the visit, additional usage of staff PPE, and extensive cleaning of exam room while observing appropriate contact time as indicated for disinfecting solutions.  Notice: This dictation was prepared with Dragon dictation along with smaller phrase technology. Any transcriptional errors that result from this process are unintentional and may not be corrected upon review.  Patient Instructions / Medication Changes & Studies & Tests Ordered   Patient Instructions  Medication Instructions:   Stop Diltiazem 240 mg    Start taking Diltiazem  CD 360 mg daily    refilled Furosemide 20 mg - per request  *If you need a refill on your cardiac medications before  your next appointment, please call your pharmacy*   Lab Work: No changes If you have labs (blood work) drawn today and your tests are completely normal, you will receive your results only by: [name] MyChart Message (if you have MyChart) OR . A paper copy in the mail If you have any lab test that is abnormal or we need to change your treatment, we will call you to review the results.   Testing/Procedures: Not needed   Follow-Up: At [facility], you and your health needs are our priority.  As part of our continuing mission to provide you with exceptional heart care, we have created designated Provider Care Teams.  These Care Teams include your primary Cardiologist (physician) and Advanced Practice Providers (APPs -  Physician Assistants and Nurse Practitioners) who all work together to provide you with the care you need, when you need it.     Your next appointment:   6 month(s)  The format for your next appointment:   In Person  Provider:   [name], MD        Studies Ordered:   Orders Placed This Encounter  Procedures  . EKG 12-Lead     [name], M.D., M.S. Interventional Cardiologist   Pager # [phone] Phone # [phone] [address] [place], [place] [address]   Thank you for choosing Heartcare at [facility]!!

## 2020-09-16 NOTE — Patient Instructions (Addendum)
Medication Instructions:   Stop Diltiazem 240 mg   Start taking Diltiazem  CD 360 mg daily    refilled Furosemide 20 mg - per request  *If you need a refill on your cardiac medications before your next appointment, please call your pharmacy*   Lab Work: No changes If you have labs (blood work) drawn today and your tests are completely normal, you will receive your results only by: [name] MyChart Message (if you have MyChart) OR . A paper copy in the mail If you have any lab test that is abnormal or we need to change your treatment, we will call you to review the results.   Testing/Procedures: Not needed   Follow-Up: At [facility], you and your health needs are our priority.  As part of our continuing mission to provide you with exceptional heart care, we have created designated Provider Care Teams.  These Care Teams include your primary Cardiologist (physician) and Advanced Practice Providers (APPs -  Physician Assistants and Nurse Practitioners) who all work together to provide you with the care you need, when you need it.     Your next appointment:   6 month(s)  The format for your next appointment:   In Person  Provider:   [name], MD

## 2020-09-20 ENCOUNTER — Telehealth: Payer: Self-pay

## 2020-09-20 ENCOUNTER — Other Ambulatory Visit: Payer: Self-pay

## 2020-09-20 ENCOUNTER — Inpatient Hospital Stay: Payer: Medicare HMO

## 2020-09-20 ENCOUNTER — Ambulatory Visit: Payer: Medicare HMO | Admitting: Hematology & Oncology

## 2020-09-20 ENCOUNTER — Other Ambulatory Visit: Payer: Medicare HMO

## 2020-09-20 NOTE — Telephone Encounter (Signed)
Patient came in for her appt today but came in at the old appt time.  Her orig appt was moved to the lunch time due to dr Marin Olp on call sch.  Pt/spouse said they recd a letter with an appt time but it seems they read the time wrong.  There was an avail time 3/29, appt made and a calendar pritned for them.  They are to bring the letter they recd to the appt for me to review.     Kaylee Hansen

## 2020-09-21 ENCOUNTER — Inpatient Hospital Stay: Payer: Medicare HMO | Attending: Hematology & Oncology

## 2020-09-21 ENCOUNTER — Inpatient Hospital Stay: Payer: Medicare HMO | Admitting: Hematology & Oncology

## 2020-09-21 ENCOUNTER — Encounter: Payer: Self-pay | Admitting: Hematology & Oncology

## 2020-09-21 VITALS — BP 126/38 | HR 82 | Temp 98.5°F | Resp 28 | Wt 167.0 lb

## 2020-09-21 DIAGNOSIS — C3492 Malignant neoplasm of unspecified part of left bronchus or lung: Secondary | ICD-10-CM | POA: Insufficient documentation

## 2020-09-21 DIAGNOSIS — J449 Chronic obstructive pulmonary disease, unspecified: Secondary | ICD-10-CM | POA: Insufficient documentation

## 2020-09-21 DIAGNOSIS — C3490 Malignant neoplasm of unspecified part of unspecified bronchus or lung: Secondary | ICD-10-CM

## 2020-09-21 DIAGNOSIS — M549 Dorsalgia, unspecified: Secondary | ICD-10-CM | POA: Insufficient documentation

## 2020-09-21 LAB — CBC WITH DIFFERENTIAL (CANCER CENTER ONLY)
Abs Immature Granulocytes: 0.04 10*3/uL (ref 0.00–0.07)
Basophils Absolute: 0.1 10*3/uL (ref 0.0–0.1)
Basophils Relative: 1 %
Eosinophils Absolute: 0.2 10*3/uL (ref 0.0–0.5)
Eosinophils Relative: 2 %
HCT: 34.1 % — ABNORMAL LOW (ref 36.0–46.0)
Hemoglobin: 10.7 g/dL — ABNORMAL LOW (ref 12.0–15.0)
Immature Granulocytes: 1 %
Lymphocytes Relative: 15 %
Lymphs Abs: 1 10*3/uL (ref 0.7–4.0)
MCH: 30.5 pg (ref 26.0–34.0)
MCHC: 31.4 g/dL (ref 30.0–36.0)
MCV: 97.2 fL (ref 80.0–100.0)
Monocytes Absolute: 0.4 10*3/uL (ref 0.1–1.0)
Monocytes Relative: 6 %
Neutro Abs: 5.3 10*3/uL (ref 1.7–7.7)
Neutrophils Relative %: 75 %
Platelet Count: 298 10*3/uL (ref 150–400)
RBC: 3.51 MIL/uL — ABNORMAL LOW (ref 3.87–5.11)
RDW: 14.1 % (ref 11.5–15.5)
WBC Count: 7 10*3/uL (ref 4.0–10.5)
nRBC: 0 % (ref 0.0–0.2)

## 2020-09-21 LAB — CMP (CANCER CENTER ONLY)
ALT: 9 U/L (ref 0–44)
AST: 12 U/L — ABNORMAL LOW (ref 15–41)
Albumin: 3.7 g/dL (ref 3.5–5.0)
Alkaline Phosphatase: 137 U/L — ABNORMAL HIGH (ref 38–126)
Anion gap: 6 (ref 5–15)
BUN: 11 mg/dL (ref 8–23)
CO2: 33 mmol/L — ABNORMAL HIGH (ref 22–32)
Calcium: 9.7 mg/dL (ref 8.9–10.3)
Chloride: 101 mmol/L (ref 98–111)
Creatinine: 0.99 mg/dL (ref 0.44–1.00)
GFR, Estimated: >60 mL/min (ref >60)
Glucose, Bld: 106 mg/dL — ABNORMAL HIGH (ref 70–99)
Potassium: 4.2 mmol/L (ref 3.5–5.1)
Sodium: 140 mmol/L (ref 135–145)
Total Bilirubin: 0.2 mg/dL — ABNORMAL LOW (ref 0.3–1.2)
Total Protein: 6.9 g/dL (ref 6.5–8.1)

## 2020-09-21 LAB — PREALBUMIN: Prealbumin: 21.3 mg/dL (ref 18–38)

## 2020-09-21 LAB — LACTATE DEHYDROGENASE: LDH: 118 U/L (ref 98–192)

## 2020-09-21 MED ORDER — LEVOFLOXACIN 250 MG PO TABS: 250.0000 mg | ORAL_TABLET | Freq: Every day | ORAL | 0 refills | Status: DC

## 2020-09-21 NOTE — Addendum Note (Signed)
Addended by: Burney Gauze R on: 09/21/2020 12:58 PM   Modules accepted: Orders

## 2020-09-21 NOTE — Progress Notes (Signed)
Hematology and Oncology Follow Up Visit  Kaylee Hansen [id number] 02-May-1956 65 y.o. 09/21/2020.     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 Kaylee Hansen is back for follow-up.  As always, she comes in a wheelchair.  Her husband comes with her.  She has a myriad of problems.  Thankfully, none of this is anything related to her having cancer.  As always, her biggest problem is her lungs.  She really sounds horrible.  She is still smoking.  She is on nebulizers.  I am unsure if she has any oxygen at home.  I think she does.  She says she is not sleeping at night.  She has pain in her back.  She has had this for a while.  She is on trazodone.  I think her family doctor is dealing with all this.  She apparently stubbed her big toe on the right foot.  She has a wrapping on this.  Her husband is taking care of this.  She was in a power chair and apparently she hit under a cabinet.  The nailbed might be trying to come up.  Again, this is going to be dealt with by her family doctor.  We did do a CT scan of her head because of headaches.  This was back in February.  Thankfully there is nothing that looks like malignancy.  She has had a chronic cough.  She has chronic sputum production.  There is no hemoptysis.  She has had no nausea or vomiting.  She seems to be eating.  She is not having diarrhea..  She is on pain medication.  Overall, I would say her performance status, at best, is ECOG 3.    Medications:  Current Outpatient Medications:  .  albuterol (PROVENTIL) (2.5 MG/3ML) 0.083% nebulizer solution, INHALE 1 VIAL BY NEBULIZATION EVERY 4 HOURS AS NEEDED FOR WHEEZING OR SHORTNESS OF BREATH., Disp: 150 mL, Rfl: 5 .  ALPRAZolam (XANAX) 0.25 MG tablet, Take 1 tablet (0.25 mg total) by mouth at bedtime as needed for anxiety., Disp: 90 tablet, Rfl: 1 .  bisacodyl (DULCOLAX)  5 MG EC tablet, Take 2 tablets (10 mg total) by mouth at bedtime. (Patient taking differently: Take 10 mg by mouth daily as needed.), Disp: 30 tablet, Rfl:  .  clobetasol (TEMOVATE) 0.05 % external solution, Apply 1 application topically 2 (two) times daily. For itchy scalp lesions, Disp: 50 mL, Rfl: 3 .  clopidogrel (PLAVIX) 75 MG tablet, TAKE 1 TABLET (75 MG TOTAL) BY MOUTH DAILY WITH BREAKFAST., Disp: 90 tablet, Rfl: 3 .  [employer] SENNA PLUS 8.6-50 MG tablet, Take 2 tablets by mouth at bedtime., Disp: , Rfl:  .  diltiazem (CARDIZEM CD) 360 MG 24 hr capsule, Take 1 capsule (360 mg total) by mouth daily., Disp: 90 capsule, Rfl: 3 .  diphenhydrAMINE HCl, Sleep, (ZZZQUIL) 25 MG CAPS, Take 50 mg by mouth at bedtime as needed (sleep)., Disp: , Rfl:  .  furosemide (LASIX) 20 MG tablet, Take 1 tablet (20 mg total) by mouth daily., Disp: 90 tablet, Rfl: 3 .  guaiFENesin (MUCINEX) 600 MG 12 hr tablet, Take 1 tablet (600 mg total) by mouth 2 (two) times daily as needed for cough or to loosen phlegm., Disp: 14 tablet, Rfl: 0 .  HYDROcodone-acetaminophen (NORCO) 10-325 MG tablet, Take 1 tablet by mouth every 6 (six)  hours as needed for severe pain. Please fill on or after 10/17/20, Disp: 120 tablet, Rfl: 0 .  HYDROcodone-acetaminophen (NORCO) 10-325 MG tablet, Take 1 tablet by mouth every 6 (six) hours as needed for severe pain. Please fill on or after 11/16/20, Disp: 120 tablet, Rfl: 0 .  hydroxypropyl methylcellulose / hypromellose (ISOPTO TEARS / GONIOVISC) 2.5 % ophthalmic solution, Place 1 drop into both eyes 3 (three) times daily as needed for dry eyes., Disp: , Rfl:  .  ketoconazole (NIZORAL) 2 % cream, APPLY TO AFFECTED AREA EVERY DAY, Disp: 45 g, Rfl: 1 .  lactulose (CHRONULAC) 10 GM/15ML solution, Take 30 mLs (20 g total) by mouth daily., Disp: 946 mL, Rfl: 5 .  levothyroxine (SYNTHROID) 150 MCG tablet, TAKE 1 TABLET BY MOUTH EVERY DAY, Disp: 90 tablet, Rfl: 3 .  OXYGEN, Inhale 2.5 L/min into the lungs at  bedtime., Disp: , Rfl:  .  pantoprazole (PROTONIX) 40 MG tablet, Take 1 tablet (40 mg total) by mouth daily before breakfast., Disp: 90 tablet, Rfl: 3 .  PARoxetine (PAXIL) 40 MG tablet, TAKE 1 TABLET BY MOUTH EVERY DAY, Disp: 90 tablet, Rfl: 3 .  phenytoin (DILANTIN) 100 MG ER capsule, TAKE 100 MG EVERY MORNING AND 200 MG EVERY NIGHT., Disp: 270 capsule, Rfl: 3 .  promethazine (PHENERGAN) 25 MG tablet, Take 1 tablet (25 mg total) by mouth every 8 (eight) hours as needed for nausea or vomiting., Disp: 30 tablet, Rfl: 0 .  Respiratory Therapy Supplies (FLUTTER) DEVI, As directed, Disp: 1 each, Rfl: 0 .  SUMAtriptan (IMITREX) 100 MG tablet, MAY REPEAT IN 2 HOURS IF HEADACHE PERSISTS OR RECURS., Disp: 9 tablet, Rfl: 3 .  traZODone (DESYREL) 100 MG tablet, TAKE 1 TABLET BY MOUTH EVERYDAY AT BEDTIME, Disp: 90 tablet, Rfl: 3 .  albuterol (PROVENTIL HFA;VENTOLIN HFA) 108 (90 Base) MCG/ACT inhaler, Inhale 2 puffs into the lungs every 6 (six) hours as needed for wheezing or shortness of breath., Disp: 1 Inhaler, Rfl: 5 .  HYDROcodone-acetaminophen (NORCO) 10-325 MG tablet, Take 1 tablet by mouth every 6 (six) hours as needed for up to 5 days for severe pain., Disp: 120 tablet, Rfl: 0 .  isosorbide mononitrate (IMDUR) 60 MG 24 hr tablet, Take 1 tablet (60 mg total) by mouth daily., Disp: 90 tablet, Rfl: 3 .  nitroGLYCERIN (NITROSTAT) 0.4 MG SL tablet, DISSOLVE ONE TABLET UNDER THE TONGUE EVERY 5 MINUTES AS NEEDED FOR CHEST PAIN.  DO NOT EXCEED A TOTAL OF 3 DOSES IN 15 MINUTES (Patient not taking: Reported on 09/21/2020), Disp: 25 tablet, Rfl: 6 .  rosuvastatin (CRESTOR) 20 MG tablet, Take 1 tablet (20 mg total) by mouth daily., Disp: 90 tablet, Rfl: 3  Allergies:  Allergies  Allergen Reactions  . Nitrofuran Derivatives Other (See Comments)    confusion  . Oxycodone Hcl Other (See Comments)    Knocked her out for 6 days  . Morphine Other (See Comments)    "Makes me go crazy."   . Penicillins Hives    Has  patient had a PCN reaction causing immediate rash, facial/tongue/throat swelling, SOB or lightheadedness with hypotension: unknown Has patient had a PCN reaction causing severe rash involving mucus membranes or skin necrosis: unknown Has patient had a PCN reaction that required hospitalization No Has patient had a PCN reaction occurring within the last 10 years: No If all of the above answers are "NO", then may proceed with Cephalosporin use.  Other reaction(s): Rash-Generalized  . Tape Other (See Comments)  Skin turns red and burns    Past Medical History, Surgical history, Social history, and Family History were reviewed and updated.  Review of Systems: Review of Systems  Constitutional: Positive for fatigue.  HENT:  Negative.   Eyes: Negative.   Respiratory: Positive for cough.   Cardiovascular: Positive for chest pain.  Gastrointestinal: Negative.   Endocrine: Negative.   Genitourinary: Negative.    Musculoskeletal: Positive for arthralgias and myalgias.  Skin: Negative.   Neurological: Negative.   Hematological: Negative.   Psychiatric/Behavioral: Negative.     Physical Exam:  weight is 167 lb (75.8 kg). Her oral temperature is 98.5 F (36.9 C). Her blood pressure is 126/38 (abnormal) and her pulse is 82. Her respiration is 28 (abnormal) and oxygen saturation is 98%.   Wt Readings from Last 3 Encounters:  09/21/20 167 lb (75.8 kg)  09/16/20 166 lb (75.3 kg)  08/26/20 167 lb (75.8 kg)    Physical Exam Vitals reviewed.  HENT:     Head: Normocephalic and atraumatic.  Eyes:     Pupils: Pupils are equal, round, and reactive to light.  Cardiovascular:     Rate and Rhythm: Normal rate and regular rhythm.     Heart sounds: Normal heart sounds.     Comments: Cardiac exam shows a regular rate and rhythm.  She has no murmurs, rubs or bruits. Pulmonary:     Effort: Pulmonary effort is normal.     Breath sounds: Normal breath sounds.     Comments: Pulmonary exam shows  scattered rhonchi bilaterally.  She has some wheezes.  She has a decent air movement. Abdominal:     General: Bowel sounds are normal.     Palpations: Abdomen is soft.  Musculoskeletal:        General: No tenderness or deformity. Normal range of motion.     Cervical back: Normal range of motion.  Lymphadenopathy:     Cervical: No cervical adenopathy.  Skin:    General: Skin is warm and dry.     Findings: No erythema or rash.     Comments: Skin exam shows scattered ecchymoses.  Neurological:     Mental Status: She is alert and oriented to person, place, and time.  Psychiatric:        Behavior: Behavior normal.        Thought Content: Thought content normal.        Judgment: Judgment normal.      Lab Results  Component Value Date   WBC 7.0 09/21/2020   HGB 10.7 (L) 09/21/2020   HCT 34.1 (L) 09/21/2020   MCV 97.2 09/21/2020   PLT 298 09/21/2020     Chemistry      Component Value Date/Time   NA 140 09/21/2020 1117   NA 144 07/08/2018 1059   K 4.2 09/21/2020 1117   CL 101 09/21/2020 1117   CO2 33 (H) 09/21/2020 1117   BUN 11 09/21/2020 1117   BUN 12 07/08/2018 1059   CREATININE 0.99 09/21/2020 1117      Component Value Date/Time   CALCIUM 9.7 09/21/2020 1117   ALKPHOS 137 (H) 09/21/2020 1117   AST 12 (L) 09/21/2020 1117   ALT 9 09/21/2020 1117   BILITOT 0.2 (L) 09/21/2020 1117       Impression and Plan: Ms. Kaylee Hansen is a 65 year old white female.  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  I feel that she  is responding to the SBRT.    At some point, but have to do some kind of scan on her.,  Sure what we will be able to do.  A PET scan was done back in January.  However, I am not sure what PET scan can do for [place] again.  However a PET scan might be the way to go.  We will try to get one set up in May.  We will call in some Levaquin for her.  Maybe, this might help with her  lungs a little bit.  I just feel bad that she has such horrible COPD.  Hopefully she will be able to get some portable oxygen so that she will be able to be more functional and mobile.   [name], MD 3/29/202212:45 PM

## 2020-09-22 ENCOUNTER — Telehealth: Payer: Self-pay

## 2020-09-22 LAB — IRON AND TIBC
Iron: 53 ug/dL (ref 41–142)
Saturation Ratios: 19 % — ABNORMAL LOW (ref 21–57)
TIBC: 278 ug/dL (ref 236–444)
UIBC: 224 ug/dL (ref 120–384)

## 2020-09-22 LAB — FERRITIN: Ferritin: 34 ng/mL (ref 11–307)

## 2020-09-22 NOTE — Telephone Encounter (Signed)
Called and informed patient of lab results and that scheduling will call to get her set up for appointments. Patient verbalized understanding.

## 2020-09-22 NOTE — Telephone Encounter (Signed)
-----   Message from [name], MD sent at 09/22/2020 10:14 AM EDT ----- Call - the iron is low!!!  She needs a dose of IV  iron. Please set this up!!  Thanks!! Laurey Arrow

## 2020-10-03 ENCOUNTER — Encounter: Payer: Self-pay | Admitting: Cardiology

## 2020-10-03 NOTE — Assessment & Plan Note (Signed)
No luck n trying to talk her into sh.

## 2020-10-03 NOTE — Assessment & Plan Note (Signed)
Reassuring findings on echo.  Valve seems to be stable.  Does have some atrial dilation.  At present, it is really hard to tell whether symptoms warrant referral for the TAVR.  Unfortunately, we are likely to lose her CT surgeon as he is moving. TAVR has not been considered to be an option for aortic insufficiency as of yet. Also, she has had stable findings with no real changes to left ventricle size.  For now we will simply continue to monitor.    She and her husband do not seem to be all that often interested in the idea of invasive procedures.  Unless TAVR can be improved, she would not be interested in surgery.

## 2020-10-03 NOTE — Assessment & Plan Note (Signed)
Borderline blood pressure today, Plan: Increase diltiazem to 360 mg daily.  Hopefully that will will affect blood pressure change, otherwise will need to then start considering reinitiation ACE inhibitor/ARB for afterload reduction.

## 2020-10-03 NOTE — Assessment & Plan Note (Signed)
With, question how well she would be able to tolerate any open surgery.  If anything, perhaps a TAVR would be available for aortic insufficiency.  As present it does not.  Continue treat medically.

## 2020-10-03 NOTE — Assessment & Plan Note (Signed)
Over 2 years out from her PCI.  She is now on Plavix for secondary prevention   Okay to hold Plavix 5 days preop for surgeries or procedures.

## 2020-10-03 NOTE — Assessment & Plan Note (Signed)
Unfortunate, she is intolerant of most statins has been able to tolerate rosuvastatin.  She has had labs checked by PCP which I have not seen.  Will defer to PCP.  Target LDL should be less than 70.  May require additional therapy.

## 2020-10-03 NOTE — Assessment & Plan Note (Signed)
2 years after non-STEMI with RCA PCI.  Normal EF. Continues on stable cardiac medications standard dose of Plavix She has not had any NTG requirement glycerin. She is on stable dose of Imdur  Not on beta-blocker because of COPD, she is on diltiazem which will increase Imdur 60 mg daily.   Continue statin

## 2020-10-03 NOTE — Assessment & Plan Note (Signed)
.    No further angina.  PCI in RCA, she remains on Plavix maintenance therapy.  Okay to hold for procedures or surgeries.  Not on beta-blocker for unclear reasons for COPD, using diltiazem which were increased to 60 mg daily. Stable dose of Imdur.  Lipid management will become an issue.

## 2020-10-05 ENCOUNTER — Encounter: Payer: Self-pay | Admitting: Internal Medicine

## 2020-10-05 ENCOUNTER — Other Ambulatory Visit: Payer: Self-pay

## 2020-10-05 ENCOUNTER — Ambulatory Visit: Payer: Medicare HMO | Admitting: Internal Medicine

## 2020-10-05 ENCOUNTER — Other Ambulatory Visit: Payer: Self-pay | Admitting: Internal Medicine

## 2020-10-05 VITALS — BP 128/52 | HR 66 | Ht 66.0 in | Wt 163.0 lb

## 2020-10-05 DIAGNOSIS — K5903 Drug induced constipation: Secondary | ICD-10-CM

## 2020-10-05 DIAGNOSIS — T402X5A Adverse effect of other opioids, initial encounter: Secondary | ICD-10-CM | POA: Diagnosis not present

## 2020-10-05 MED ORDER — NALOXEGOL OXALATE 25 MG PO TABS: 25.0000 mg | ORAL_TABLET | Freq: Every day | ORAL | 0 refills | Status: DC

## 2020-10-05 NOTE — Telephone Encounter (Signed)
Please advise Sir, thank you. 

## 2020-10-05 NOTE — Patient Instructions (Signed)
Dr Carlean Purl recommends that you complete a bowel purge (to clean out your bowels). Please do the following: Purchase a bottle of Miralax over the counter as well as a box of 5 mg dulcolax tablets. Take 2 dulcolax tablets and 2 Senokot Wait 1 hour. You will then drink 6-8 capfuls of Miralax mixed in an adequate amount of water/juice/gatorade (you may choose which of these liquids to drink) over the next 2-3 hours. You should expect results within 1 to 6 hours after completing the bowel purge.   After the purge start Movantik daily. We are providing you with samples today and we sent it to the pharmacy as well.   If the Movantik is not working [place] may take her other laxative pills.  Call us back if it is not working.  I appreciate the opportunity to care for you. Silvano Rusk, MD, [facility]

## 2020-10-05 NOTE — Telephone Encounter (Signed)
Interaction did not come up when I rxed but the diltiazem can interact and raise levels of Movantiik so needs to be reduced  I rxed 12.5 mg instead  Tell Brylea and Rex that the pharmacist turned this up so also looks like cannot cut the 25 mg tabs so try the 12.5 mg Rx instead

## 2020-10-05 NOTE — Progress Notes (Signed)
Kaylee Hansen 65 y.o. January 18, 1956 [id number]  Assessment & Plan:   Encounter Diagnosis  Name Primary?  . Constipation due to opioid therapy Yes   She will do a MiraLAX purge and then start Movantik.  This appears to be covered though it could be expensive.  I have given her samples and we sent a prescription in.  Her husband knows to check on the price to see if it is affordable.  She has tried Linzess in the past it was too expensive and its not on her formulary now.  Another option might be lubiprostone.  This is on her formulary though we do not know how much it would cost.  The goal is to try to get regular defecation and improve her quality of life so she is not fearful of leaving the house.  Meds ordered this encounter  Medications  . naloxegol oxalate (MOVANTIK) 25 MG TABS tablet    Sig: Take 1 tablet (25 mg total) by mouth daily.    Dispense:  30 tablet    Refill:  0   Return in about 1 month.  They know to contact me in between if things are not improving.  I appreciate the opportunity to care for this patient. CC: Plotnikov, [name], MD  Subjective:   Chief Complaint: Constipation  HPI Kaylee Hansen is here with her husband Rex still having constipation issues.  When I last saw her she was not impacted she continued to have issues and we added lactulose she took that for a week or 10 days and said that the only way she can really move her bowels is to take 2 Senokot and 2 Dulcolax and she has to take that for 2 or 3 days then she will have multiple bowel movements go for a few days feeling all right and then repeat the cycle.  Because of the numerous stools afterward she does not want to leave the house.  She does use her narcotics 2 or 3 times a day.  Long history of constipation perhaps exacerbated by narcotic use for chronic pain.  Not ambulating much either. Allergies  Allergen Reactions  . Nitrofuran Derivatives Other (See Comments)    confusion  . Oxycodone Hcl  Other (See Comments)    Knocked her out for 6 days  . Morphine Other (See Comments)    "Makes me go crazy."   . Penicillins Hives    Has patient had a PCN reaction causing immediate rash, facial/tongue/throat swelling, SOB or lightheadedness with hypotension: unknown Has patient had a PCN reaction causing severe rash involving mucus membranes or skin necrosis: unknown Has patient had a PCN reaction that required hospitalization No Has patient had a PCN reaction occurring within the last 10 years: No If all of the above answers are "NO", then may proceed with Cephalosporin use.  Other reaction(s): Rash-Generalized  . Tape Other (See Comments)    Skin turns red and burns   Current Meds  Medication Sig  . albuterol (PROVENTIL) (2.5 MG/3ML) 0.083% nebulizer solution INHALE 1 VIAL BY NEBULIZATION EVERY 4 HOURS AS NEEDED FOR WHEEZING OR SHORTNESS OF BREATH.  [name] ALPRAZolam (XANAX) 0.25 MG tablet Take 1 tablet (0.25 mg total) by mouth at bedtime as needed for anxiety.  . bisacodyl (DULCOLAX) 5 MG EC tablet Take 2 tablets (10 mg total) by mouth at bedtime. (Patient taking differently: Take 10 mg by mouth daily as needed.)  . clobetasol (TEMOVATE) 0.05 % external solution Apply 1 application topically 2 (  two) times daily. For itchy scalp lesions  . clopidogrel (PLAVIX) 75 MG tablet TAKE 1 TABLET (75 MG TOTAL) BY MOUTH DAILY WITH BREAKFAST.  . [facility] SENNA PLUS 8.6-50 MG tablet Take 2 tablets by mouth at bedtime.  [name] diltiazem (CARDIZEM CD) 360 MG 24 hr capsule Take 1 capsule (360 mg total) by mouth daily.  . diphenhydrAMINE HCl, Sleep, (ZZZQUIL) 25 MG CAPS Take 50 mg by mouth at bedtime as needed (sleep).  . furosemide (LASIX) 20 MG tablet Take 1 tablet (20 mg total) by mouth daily.  [name] guaiFENesin (MUCINEX) 600 MG 12 hr tablet Take 1 tablet (600 mg total) by mouth 2 (two) times daily as needed for cough or to loosen phlegm.  [name] HYDROcodone-acetaminophen (NORCO) 10-325 MG tablet Take 1 tablet by mouth  every 6 (six) hours as needed for severe pain. Please fill on or after 11/16/20  . hydroxypropyl methylcellulose / hypromellose (ISOPTO TEARS / GONIOVISC) 2.5 % ophthalmic solution Place 1 drop into both eyes 3 (three) times daily as needed for dry eyes.  [name] ketoconazole (NIZORAL) 2 % cream APPLY TO AFFECTED AREA EVERY DAY  . levofloxacin (LEVAQUIN) 250 MG tablet Take 1 tablet (250 mg total) by mouth daily.  [name] levothyroxine (SYNTHROID) 150 MCG tablet TAKE 1 TABLET BY MOUTH EVERY DAY  . nitroGLYCERIN (NITROSTAT) 0.4 MG SL tablet DISSOLVE ONE TABLET UNDER THE TONGUE EVERY 5 MINUTES AS NEEDED FOR CHEST PAIN.  DO NOT EXCEED A TOTAL OF 3 DOSES IN 15 MINUTES (Patient taking differently: DISSOLVE ONE TABLET UNDER THE TONGUE EVERY 5 MINUTES AS NEEDED FOR CHEST PAIN.  DO NOT EXCEED A TOTAL OF 3 DOSES IN 15 MINUTES)  . OXYGEN Inhale 2.5 L/min into the lungs at bedtime.  . pantoprazole (PROTONIX) 40 MG tablet Take 1 tablet (40 mg total) by mouth daily before breakfast.  . PARoxetine (PAXIL) 40 MG tablet TAKE 1 TABLET BY MOUTH EVERY DAY  . phenytoin (DILANTIN) 100 MG ER capsule TAKE 100 MG EVERY MORNING AND 200 MG EVERY NIGHT.  [name] promethazine (PHENERGAN) 25 MG tablet Take 1 tablet (25 mg total) by mouth every 8 (eight) hours as needed for nausea or vomiting.  [name] Respiratory Therapy Supplies (FLUTTER) DEVI As directed  . SUMAtriptan (IMITREX) 100 MG tablet MAY REPEAT IN 2 HOURS IF HEADACHE PERSISTS OR RECURS.  . traZODone (DESYREL) 100 MG tablet TAKE 1 TABLET BY MOUTH EVERYDAY AT BEDTIME  . [DISCONTINUED] HYDROcodone-acetaminophen (NORCO) 10-325 MG tablet Take 1 tablet by mouth every 6 (six) hours as needed for severe pain. Please fill on or after 10/17/20  . [DISCONTINUED] lactulose (CHRONULAC) 10 GM/15ML solution Take 30 mLs (20 g total) by mouth daily.   Past Medical History:  Diagnosis Date  . Acute respiratory failure following trauma and surgery ([place]) 08/26/2012  . Anxiety   . Aortic insufficiency with  aortic stenosis 04/2017   TTE December 2019: Mild AS with severe regurgitation.  Mild LA dilation.;;  TEE January 2016: Severe-type III aortic regurgitation (holodiastolic flow reversal in the a sending aorta & vena contracta =6.   . CAD S/P percutaneous coronary angioplasty 11/2017   Proximal RCA PCI Synergy DES 3.5 mm x 16 mm (3.8 mm). Ost-mLM 30%. Ost-prox Cx 40%.  . Collagenous colitis   . Colon adenomas 2011  . Constipation    Chronic abdominal pain and constipation  . COPD (chronic obstructive pulmonary disease) ([place])   . Depression   . Diverticulosis   . Esophageal stricture 11/08/2012   Ulcer noted in  2010  . GERD (gastroesophageal reflux disease)   . Helicobacter pylori gastritis 2010   Pylera Tx  . History of cholecystectomy   . Hx of appendectomy   . Hx of cancer of lung 1999  . Hx of hysterectomy   . Hyperlipidemia   . Hypothyroidism   . Iron deficiency anemia due to chronic blood loss 10/01/2019  . Low back pain   . Non-STEMI (non-ST elevated myocardial infarction) ([place]) 06/2018   RCA PCI  . Osteoarthritis of knee    bilateral knee  . Seizures ([place])   . Stroke [facility])    CVA, hx of 97  . 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 ABDOMINAL HYSTERECTOMY     complete 1992  . ABDOMINAL SURGERY     Exploratory  . APPENDECTOMY    . BALLOON DILATION N/A 11/08/2012   Procedure: BALLOON DILATION;  Surgeon: Gatha Mayer, MD;  Location: [facility] ENDOSCOPY;  Service: Endoscopy;  Laterality: N/A;  . CHOLECYSTECTOMY    . COLONOSCOPY W/ BIOPSIES     multiple  . CORONARY STENT INTERVENTION N/A 06/28/2018   Procedure: CORONARY STENT INTERVENTION;  Surgeon: Martinique, Peter M, MD;  Location: [place] CV LAB;  Service: Cardiovascular;  Laterality: N/A;  90% prox RCA -> PCI with Synergy DES 3.5 mm x 16 mm (3.8 mm).  . CT CTA CORONARY W/CA SCORE W/CM &/OR WO/CM  05/2017   Coronary calcium score 2.9. Intermediate risk. Unusual noncalcified plaque in  RCA --FFR negative  . ESOPHAGOGASTRODUODENOSCOPY     w/baloon x 2  . ESOPHAGOGASTRODUODENOSCOPY N/A 11/08/2012   Procedure: ESOPHAGOGASTRODUODENOSCOPY (EGD);  Surgeon: Gatha Mayer, MD;  Location: [facility];  Service: Endoscopy;  Laterality: N/A;  . ESOPHAGOGASTRODUODENOSCOPY (EGD) WITH PROPOFOL N/A 08/25/2019   Procedure: ESOPHAGOGASTRODUODENOSCOPY (EGD) WITH PROPOFOL;  Surgeon: Gatha Mayer, MD;  Location: [facility] ENDOSCOPY;  Service: Endoscopy;  Laterality: N/A;  . LEFT HEART CATH AND CORONARY ANGIOGRAPHY N/A 06/28/2018   Procedure: LEFT HEART CATH AND CORONARY ANGIOGRAPHY;  Surgeon: Martinique, Peter M, MD;  Location: [place] CV LAB;  Service: Cardiovascular:  90% prox RCA -> DES PCI. Ost-mLM 30%. Ost-prox Cx 40%.   EF 55-65%. LVEDP 15 mmHg.   [name] MALONEY DILATION  08/25/2019   Procedure: MALONEY DILATION;  Surgeon: Gatha Mayer, MD;  Location: [facility];  Service: Endoscopy;;  . RIGHT HEART CATH N/A 07/11/2018   Procedure: RIGHT HEART CATH;  Surgeon: Leonie Man, MD;  Location: [place] CV LAB;;   Relatively normal Right Heart Cath pressures: PA pressure 26/14 mmHg-mean 20 mmHg; PCWP 13 mmHg.  RAP 6 mmHg, RVP 28/3 mmHg-EDP 10 mmHg. Severely reduced cardiac output and index by Fick: 3.63 and 2.07.  . TEE WITHOUT CARDIOVERSION N/A 07/11/2018   Procedure: TRANSESOPHAGEAL ECHOCARDIOGRAM (TEE);  Surgeon: [name], MD;  Location: [facility] ENDOSCOPY;  Service: Cardiology;;  Severe aortic regurgitation-type III. (suggested by holodiastolic flow reversal and descending aorta-vena contracta 6 mm).  . TOTAL HIP ARTHROPLASTY     Left  . TRANSTHORACIC ECHOCARDIOGRAM  05/2018   EF 60-65%.  No R WMA.  GR 1 DD.  Mild aortic stenosis with severe regurgitation.  Mild MR..  Only mild LV dilation noted.  . TRANSTHORACIC ECHOCARDIOGRAM  12/2018    EF 60-65%. Mild LVH. Gr1 DD. Mod AoV thickening. Mod-Severe AI, ~ mlid AS.   Social History   Social History Narrative   Married   2 children   No  regular exercise         family history includes Coronary artery disease in her mother and another family member; Diabetes in her mother and son; Hypertension in her father; Kidney cancer in her sister; Kidney disease in an other family member.   Review of Systems As above  Objective:   Physical Exam  BP (!) 128/52   Pulse 66   Ht 5\' 6"  (1.676 m)   Wt 163 lb (73.9 kg)   BMI 26.31 kg/m  Chron ill NAD abd mildly tender left uq and mid quad

## 2020-10-06 DIAGNOSIS — G319 Degenerative disease of nervous system, unspecified: Secondary | ICD-10-CM | POA: Diagnosis not present

## 2020-10-06 DIAGNOSIS — Z85118 Personal history of other malignant neoplasm of bronchus and lung: Secondary | ICD-10-CM | POA: Diagnosis not present

## 2020-10-06 DIAGNOSIS — S0101XA Laceration without foreign body of scalp, initial encounter: Secondary | ICD-10-CM | POA: Diagnosis not present

## 2020-10-06 DIAGNOSIS — Z23 Encounter for immunization: Secondary | ICD-10-CM | POA: Diagnosis not present

## 2020-10-06 DIAGNOSIS — S0003XA Contusion of scalp, initial encounter: Secondary | ICD-10-CM | POA: Diagnosis not present

## 2020-10-06 DIAGNOSIS — W19XXXA Unspecified fall, initial encounter: Secondary | ICD-10-CM | POA: Diagnosis not present

## 2020-10-06 DIAGNOSIS — Z85841 Personal history of malignant neoplasm of brain: Secondary | ICD-10-CM | POA: Diagnosis not present

## 2020-10-06 DIAGNOSIS — Y9389 Activity, other specified: Secondary | ICD-10-CM | POA: Diagnosis not present

## 2020-10-06 DIAGNOSIS — Y92009 Unspecified place in unspecified non-institutional (private) residence as the place of occurrence of the external cause: Secondary | ICD-10-CM | POA: Diagnosis not present

## 2020-10-06 NOTE — Telephone Encounter (Signed)
I spoke with her husband Rex and informed him of the plan and he verbalized understanding.

## 2020-10-17 DIAGNOSIS — C3490 Malignant neoplasm of unspecified part of unspecified bronchus or lung: Secondary | ICD-10-CM | POA: Diagnosis not present

## 2020-11-15 ENCOUNTER — Other Ambulatory Visit: Payer: Self-pay

## 2020-11-15 ENCOUNTER — Ambulatory Visit (HOSPITAL_COMMUNITY)
Admission: RE | Admit: 2020-11-15 | Discharge: 2020-11-15 | Disposition: A | Discharge status: Home / Self Care | Payer: Medicare HMO | Source: Ambulatory Visit | Attending: Hematology & Oncology | Admitting: Hematology & Oncology

## 2020-11-15 DIAGNOSIS — C3492 Malignant neoplasm of unspecified part of left bronchus or lung: Secondary | ICD-10-CM | POA: Insufficient documentation

## 2020-11-15 DIAGNOSIS — C3490 Malignant neoplasm of unspecified part of unspecified bronchus or lung: Secondary | ICD-10-CM

## 2020-11-15 LAB — GLUCOSE, CAPILLARY: Glucose-Capillary: 95 mg/dL (ref 70–99)

## 2020-11-15 MED ORDER — FLUDEOXYGLUCOSE F - 18 (FDG) INJECTION
8.4000 | Freq: Once | INTRAVENOUS | Status: AC | PRN
  Administered 2020-11-15: 8.1 via INTRAVENOUS

## 2020-11-16 DIAGNOSIS — M25562 Pain in left knee: Secondary | ICD-10-CM | POA: Diagnosis not present

## 2020-11-16 DIAGNOSIS — M1712 Unilateral primary osteoarthritis, left knee: Secondary | ICD-10-CM | POA: Diagnosis not present

## 2020-11-16 DIAGNOSIS — C3490 Malignant neoplasm of unspecified part of unspecified bronchus or lung: Secondary | ICD-10-CM | POA: Diagnosis not present

## 2020-11-17 ENCOUNTER — Telehealth: Payer: Self-pay

## 2020-11-17 NOTE — Telephone Encounter (Signed)
Called and informed of PET scan results, patient verbalized understanding and denies any questions or concerns at this time. Confirmed appt for tomorrow with MD.

## 2020-11-17 NOTE — Telephone Encounter (Signed)
-----   Message from [name], MD sent at 11/17/2020 10:15 AM EDT ----- Call -- No cancer on the PET scan!!  [facility]

## 2020-11-18 ENCOUNTER — Ambulatory Visit: Payer: Medicare HMO | Admitting: Internal Medicine

## 2020-11-18 ENCOUNTER — Encounter: Payer: Self-pay | Admitting: Hematology & Oncology

## 2020-11-18 ENCOUNTER — Inpatient Hospital Stay: Payer: Medicare HMO | Admitting: Hematology & Oncology

## 2020-11-18 ENCOUNTER — Inpatient Hospital Stay: Payer: Medicare HMO | Attending: Hematology & Oncology

## 2020-11-18 ENCOUNTER — Other Ambulatory Visit: Payer: Self-pay

## 2020-11-18 ENCOUNTER — Telehealth: Payer: Self-pay

## 2020-11-18 VITALS — BP 118/35 | HR 77 | Temp 97.7°F | Resp 21

## 2020-11-18 DIAGNOSIS — Z85118 Personal history of other malignant neoplasm of bronchus and lung: Secondary | ICD-10-CM | POA: Insufficient documentation

## 2020-11-18 DIAGNOSIS — D500 Iron deficiency anemia secondary to blood loss (chronic): Secondary | ICD-10-CM

## 2020-11-18 DIAGNOSIS — J449 Chronic obstructive pulmonary disease, unspecified: Secondary | ICD-10-CM | POA: Diagnosis not present

## 2020-11-18 DIAGNOSIS — C3490 Malignant neoplasm of unspecified part of unspecified bronchus or lung: Secondary | ICD-10-CM

## 2020-11-18 LAB — CBC WITH DIFFERENTIAL (CANCER CENTER ONLY)
Abs Immature Granulocytes: 0.03 10*3/uL (ref 0.00–0.07)
Basophils Absolute: 0.1 10*3/uL (ref 0.0–0.1)
Basophils Relative: 1 %
Eosinophils Absolute: 0.1 10*3/uL (ref 0.0–0.5)
Eosinophils Relative: 2 %
HCT: 33 % — ABNORMAL LOW (ref 36.0–46.0)
Hemoglobin: 10.3 g/dL — ABNORMAL LOW (ref 12.0–15.0)
Immature Granulocytes: 0 %
Lymphocytes Relative: 15 %
Lymphs Abs: 1.1 10*3/uL (ref 0.7–4.0)
MCH: 30.6 pg (ref 26.0–34.0)
MCHC: 31.2 g/dL (ref 30.0–36.0)
MCV: 97.9 fL (ref 80.0–100.0)
Monocytes Absolute: 0.6 10*3/uL (ref 0.1–1.0)
Monocytes Relative: 8 %
Neutro Abs: 5.3 10*3/uL (ref 1.7–7.7)
Neutrophils Relative %: 74 %
Platelet Count: 247 10*3/uL (ref 150–400)
RBC: 3.37 MIL/uL — ABNORMAL LOW (ref 3.87–5.11)
RDW: 14.7 % (ref 11.5–15.5)
WBC Count: 7.2 10*3/uL (ref 4.0–10.5)
nRBC: 0 % (ref 0.0–0.2)

## 2020-11-18 LAB — CMP (CANCER CENTER ONLY)
ALT: 12 U/L (ref 0–44)
AST: 14 U/L — ABNORMAL LOW (ref 15–41)
Albumin: 3.8 g/dL (ref 3.5–5.0)
Alkaline Phosphatase: 113 U/L (ref 38–126)
Anion gap: 6 (ref 5–15)
BUN: 11 mg/dL (ref 8–23)
CO2: 32 mmol/L (ref 22–32)
Calcium: 9.6 mg/dL (ref 8.9–10.3)
Chloride: 102 mmol/L (ref 98–111)
Creatinine: 0.92 mg/dL (ref 0.44–1.00)
GFR, Estimated: >60 mL/min (ref >60)
Glucose, Bld: 97 mg/dL (ref 70–99)
Potassium: 4.4 mmol/L (ref 3.5–5.1)
Sodium: 140 mmol/L (ref 135–145)
Total Bilirubin: 0.2 mg/dL — ABNORMAL LOW (ref 0.3–1.2)
Total Protein: 6.8 g/dL (ref 6.5–8.1)

## 2020-11-18 NOTE — Telephone Encounter (Signed)
appts made per 11/18/20 los and pt/spouse placed appts on lab sheet and states will also view on mychart  Kaylee Hansen

## 2020-11-18 NOTE — Progress Notes (Signed)
Hematology and Oncology Follow Up Visit  Kaylee Hansen [id number] 04-03-1956 65 y.o. 11/18/2020.     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  As always, she comes in a wheelchair.  Her husband comes with her.  Thankfully, we did do a PET scan on her.  The PET scan did not show any evidence of active cancer.  I told her that her cancer will come back because she is still smoking.  I know that she understands this.  Think her bigger problem right now might be her anemia.  Her hemoglobin is trending downward.  I have to suspect that she probably is going to be iron deficient.  She also may have an element of erythropoietin deficiency.  We will have to check both of these levels when we see her back.  She has had no problems with bleeding.  There is no change in bowel or bladder habits.  She has had no melena or bright red blood per rectum.  She has had no increased cough.  She has horrible lungs.  She has a lot of COPD.  She has had no rashes.  Is been a little bit of leg swelling.  Overall, her performance status is ECOG 2.     Medications:  Current Outpatient Medications:  .  albuterol (PROVENTIL) (2.5 MG/3ML) 0.083% nebulizer solution, INHALE 1 VIAL BY NEBULIZATION EVERY 4 HOURS AS NEEDED FOR WHEEZING OR SHORTNESS OF BREATH., Disp: 150 mL, Rfl: 5 .  ALPRAZolam (XANAX) 0.25 MG tablet, Take 1 tablet (0.25 mg total) by mouth at bedtime as needed for anxiety., Disp: 90 tablet, Rfl: 1 .  bisacodyl (DULCOLAX) 5 MG EC tablet, Take 2 tablets (10 mg total) by mouth at bedtime. (Patient taking differently: Take 10 mg by mouth daily as needed.), Disp: 30 tablet, Rfl:  .  clobetasol (TEMOVATE) 0.05 % external solution, Apply 1 application topically 2 (two) times daily. For itchy scalp lesions, Disp: 50 mL, Rfl: 3 .   clopidogrel (PLAVIX) 75 MG tablet, TAKE 1 TABLET (75 MG TOTAL) BY MOUTH DAILY WITH BREAKFAST., Disp: 90 tablet, Rfl: 3 .  [facility] SENNA PLUS 8.6-50 MG tablet, Take 2 tablets by mouth at bedtime., Disp: , Rfl:  .  diltiazem (CARDIZEM CD) 360 MG 24 hr capsule, Take 1 capsule (360 mg total) by mouth daily., Disp: 90 capsule, Rfl: 3 .  diltiazem (TIAZAC) 360 MG 24 hr capsule, diltiazem CD 360 mg capsule,extended release 24 hr  TAKE 1 CAPSULE BY MOUTH EVERY DAY, Disp: , Rfl:  .  diphenhydrAMINE HCl, Sleep, (ZZZQUIL) 25 MG CAPS, Take 50 mg by mouth at bedtime as needed (sleep)., Disp: , Rfl:  .  ergocalciferol (VITAMIN D2) 1.25 MG (50000 UT) capsule, ergocalciferol (vitamin D2) 1,250 mcg (50,000 unit) capsule  TAKE 1 CAPSULE (50,000 UNITS TOTAL) BY MOUTH EVERY MONDAY., Disp: , Rfl:  .  furosemide (LASIX) 20 MG tablet, Take 1 tablet (20 mg total) by mouth daily., Disp: 90 tablet, Rfl: 3 .  guaiFENesin (MUCINEX) 600 MG 12 hr tablet, Take 1 tablet (600 mg total) by mouth 2 (two) times daily as needed for cough or to loosen phlegm., Disp: 14 tablet, Rfl: 0 .  HYDROcodone-acetaminophen (NORCO) 10-325 MG tablet, Take 1 tablet by mouth every 6 (six) hours as needed for severe pain. Please fill on  or after 11/16/20, Disp: 120 tablet, Rfl: 0 .  hydroxypropyl methylcellulose / hypromellose (ISOPTO TEARS / GONIOVISC) 2.5 % ophthalmic solution, Place 1 drop into both eyes 3 (three) times daily as needed for dry eyes., Disp: , Rfl:  .  ketoconazole (NIZORAL) 2 % cream, APPLY TO AFFECTED AREA EVERY DAY, Disp: 45 g, Rfl: 1 .  lactulose (CHRONULAC) 10 GM/15ML solution, lactulose 10 gram/15 mL oral solution  TAKE 30 MLS (20 G TOTAL) BY MOUTH DAILY., Disp: , Rfl:  .  levofloxacin (LEVAQUIN) 250 MG tablet, Take 1 tablet (250 mg total) by mouth daily., Disp: 7 tablet, Rfl: 0 .  levothyroxine (SYNTHROID) 150 MCG tablet, TAKE 1 TABLET BY MOUTH EVERY DAY, Disp: 90 tablet, Rfl: 3 .  naloxegol oxalate (MOVANTIK) 12.5 MG TABS tablet,  Take 1 tablet (12.5 mg total) by mouth daily., Disp: 30 tablet, Rfl: 0 .  nitroGLYCERIN (NITROSTAT) 0.4 MG SL tablet, DISSOLVE ONE TABLET UNDER THE TONGUE EVERY 5 MINUTES AS NEEDED FOR CHEST PAIN.  DO NOT EXCEED A TOTAL OF 3 DOSES IN 15 MINUTES (Patient taking differently: DISSOLVE ONE TABLET UNDER THE TONGUE EVERY 5 MINUTES AS NEEDED FOR CHEST PAIN.  DO NOT EXCEED A TOTAL OF 3 DOSES IN 15 MINUTES), Disp: 25 tablet, Rfl: 6 .  OXYGEN, Inhale 2.5 L/min into the lungs at bedtime., Disp: , Rfl:  .  pantoprazole (PROTONIX) 40 MG tablet, Take 1 tablet (40 mg total) by mouth daily before breakfast., Disp: 90 tablet, Rfl: 3 .  PARoxetine (PAXIL) 40 MG tablet, TAKE 1 TABLET BY MOUTH EVERY DAY, Disp: 90 tablet, Rfl: 3 .  phenytoin (DILANTIN) 100 MG ER capsule, TAKE 100 MG EVERY MORNING AND 200 MG EVERY NIGHT., Disp: 270 capsule, Rfl: 3 .  promethazine (PHENERGAN) 25 MG tablet, Take 1 tablet (25 mg total) by mouth every 8 (eight) hours as needed for nausea or vomiting., Disp: 30 tablet, Rfl: 0 .  Respiratory Therapy Supplies (FLUTTER) DEVI, As directed, Disp: 1 each, Rfl: 0 .  SUMAtriptan (IMITREX) 100 MG tablet, MAY REPEAT IN 2 HOURS IF HEADACHE PERSISTS OR RECURS., Disp: 9 tablet, Rfl: 3 .  traZODone (DESYREL) 100 MG tablet, TAKE 1 TABLET BY MOUTH EVERYDAY AT BEDTIME, Disp: 90 tablet, Rfl: 3 .  albuterol (PROVENTIL HFA;VENTOLIN HFA) 108 (90 Base) MCG/ACT inhaler, Inhale 2 puffs into the lungs every 6 (six) hours as needed for wheezing or shortness of breath., Disp: 1 Inhaler, Rfl: 5 .  azithromycin (ZITHROMAX) 250 MG tablet, azithromycin 250 mg tablet  TAKE 2 TABLETS BY MOUTH TODAY, THEN TAKE 1 TABLET DAILY FOR 4 DAYS (Patient not taking: Reported on 11/18/2020), Disp: , Rfl:  .  doxycycline (VIBRA-TABS) 100 MG tablet, doxycycline hyclate 100 mg tablet  TAKE 1 TABLET BY MOUTH TWICE A DAY (Patient not taking: Reported on 11/18/2020), Disp: , Rfl:  .  isosorbide mononitrate (IMDUR) 60 MG 24 hr tablet, Take 1 tablet  (60 mg total) by mouth daily., Disp: 90 tablet, Rfl: 3 .  methylPREDNISolone (MEDROL DOSEPAK) 4 MG TBPK tablet, methylprednisolone 4 mg tablets in a dose pack  TAKE 6-5-4-3-2-1. START TODAY (Patient not taking: Reported on 11/18/2020), Disp: , Rfl:  .  rosuvastatin (CRESTOR) 20 MG tablet, Take 1 tablet (20 mg total) by mouth daily., Disp: 90 tablet, Rfl: 3 .  sulfamethoxazole-trimethoprim (BACTRIM DS) 800-160 MG tablet, sulfamethoxazole 800 mg-trimethoprim 160 mg tablet  TAKE 1 TABLET BY MOUTH TWICE A DAY FOR 7 DAYS (Patient not taking: Reported on 11/18/2020), Disp: , Rfl:  Allergies:  Allergies  Allergen Reactions  . Nitrofuran Derivatives Other (See Comments)    confusion  . Oxycodone Hcl Other (See Comments)    Knocked her out for 6 days  . Morphine Other (See Comments)    "Makes me go crazy."   . Penicillins Hives    Has patient had a PCN reaction causing immediate rash, facial/tongue/throat swelling, SOB or lightheadedness with hypotension: unknown Has patient had a PCN reaction causing severe rash involving mucus membranes or skin necrosis: unknown Has patient had a PCN reaction that required hospitalization No Has patient had a PCN reaction occurring within the last 10 years: No If all of the above answers are "NO", then may proceed with Cephalosporin use.  Other reaction(s): Rash-Generalized  . Tape Other (See Comments)    Skin turns red and burns    Past Medical History, Surgical history, Social history, and Family History were reviewed and updated.  Review of Systems: Review of Systems  Constitutional: Positive for fatigue.  HENT:  Negative.   Eyes: Negative.   Respiratory: Positive for cough.   Cardiovascular: Positive for chest pain.  Gastrointestinal: Negative.   Endocrine: Negative.   Genitourinary: Negative.    Musculoskeletal: Positive for arthralgias and myalgias.  Skin: Negative.   Neurological: Negative.   Hematological: Negative.   Psychiatric/Behavioral:  Negative.     Physical Exam:  oral temperature is 97.7 F (36.5 C). Her blood pressure is 118/35 (abnormal) and her pulse is 77. Her respiration is 21 (abnormal) and oxygen saturation is 97%.   Wt Readings from Last 3 Encounters:  10/05/20 163 lb (73.9 kg)  09/21/20 167 lb (75.8 kg)  09/16/20 166 lb (75.3 kg)    Physical Exam Vitals reviewed.  HENT:     Head: Normocephalic and atraumatic.  Eyes:     Pupils: Pupils are equal, round, and reactive to light.  Cardiovascular:     Rate and Rhythm: Normal rate and regular rhythm.     Heart sounds: Normal heart sounds.     Comments: Cardiac exam shows a regular rate and rhythm.  She has no murmurs, rubs or bruits. Pulmonary:     Effort: Pulmonary effort is normal.     Breath sounds: Normal breath sounds.     Comments: Pulmonary exam shows scattered rhonchi bilaterally.  She has some wheezes.  She has a decent air movement. Abdominal:     General: Bowel sounds are normal.     Palpations: Abdomen is soft.  Musculoskeletal:        General: No tenderness or deformity. Normal range of motion.     Cervical back: Normal range of motion.  Lymphadenopathy:     Cervical: No cervical adenopathy.  Skin:    General: Skin is warm and dry.     Findings: No erythema or rash.     Comments: Skin exam shows scattered ecchymoses.  Neurological:     Mental Status: She is alert and oriented to person, place, and time.  Psychiatric:        Behavior: Behavior normal.        Thought Content: Thought content normal.        Judgment: Judgment normal.      Lab Results  Component Value Date   WBC 7.2 11/18/2020   HGB 10.3 (L) 11/18/2020   HCT 33.0 (L) 11/18/2020   MCV 97.9 11/18/2020   PLT 247 11/18/2020     Chemistry      Component Value Date/Time   NA 140  11/18/2020 1125   NA 144 07/08/2018 1059   K 4.4 11/18/2020 1125   CL 102 11/18/2020 1125   CO2 32 11/18/2020 1125   BUN 11 11/18/2020 1125   BUN 12 07/08/2018 1059   CREATININE 0.92  11/18/2020 1125      Component Value Date/Time   CALCIUM 9.6 11/18/2020 1125   ALKPHOS 113 11/18/2020 1125   AST 14 (L) 11/18/2020 1125   ALT 12 11/18/2020 1125   BILITOT 0.2 (L) 11/18/2020 1125       Impression and Plan: Ms. Palladino is a 65 year old white female.  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  For now, we are looking really good with respect to her cancer.  However, I am sure that her cancer will come back.  We do not need to do another PET scan for another 6 months.  We will see her back in about 3 months.  I have to follow-up on this anemia.  We may have to treat this.  Given her poor lung function, I really cannot have her hemoglobin drop much lower rales that she will have much more stress on her.  I feel that she is responding to the SBRT.    At some point, but have to do some kind of scan on her.,  Sure what we will be able to do.  A PET scan was done back in January.  However, I am not sure what PET scan can do for [place] again.  However a PET scan might be the way to go.  We will try to get one set up in May.  We will call in some Levaquin for her.  Maybe, this might help with her lungs a little bit.  I just feel bad that she has such horrible COPD.  Hopefully she will be able to get some portable oxygen so that she will be able to be more functional and mobile.   [name], MD 5/26/202212:57 PM

## 2020-12-02 ENCOUNTER — Other Ambulatory Visit: Payer: Self-pay | Admitting: Internal Medicine

## 2020-12-02 ENCOUNTER — Other Ambulatory Visit: Payer: Self-pay

## 2020-12-02 ENCOUNTER — Ambulatory Visit (INDEPENDENT_AMBULATORY_CARE_PROVIDER_SITE_OTHER): Payer: Medicare HMO | Admitting: Internal Medicine

## 2020-12-02 ENCOUNTER — Encounter: Payer: Self-pay | Admitting: Internal Medicine

## 2020-12-02 DIAGNOSIS — J9611 Chronic respiratory failure with hypoxia: Secondary | ICD-10-CM

## 2020-12-02 DIAGNOSIS — I10 Essential (primary) hypertension: Secondary | ICD-10-CM | POA: Diagnosis not present

## 2020-12-02 DIAGNOSIS — G8929 Other chronic pain: Secondary | ICD-10-CM | POA: Diagnosis not present

## 2020-12-02 DIAGNOSIS — M5440 Lumbago with sciatica, unspecified side: Secondary | ICD-10-CM

## 2020-12-02 DIAGNOSIS — D485 Neoplasm of uncertain behavior of skin: Secondary | ICD-10-CM | POA: Diagnosis not present

## 2020-12-02 DIAGNOSIS — J441 Chronic obstructive pulmonary disease with (acute) exacerbation: Secondary | ICD-10-CM

## 2020-12-02 DIAGNOSIS — G43109 Migraine with aura, not intractable, without status migrainosus: Secondary | ICD-10-CM

## 2020-12-02 DIAGNOSIS — C3490 Malignant neoplasm of unspecified part of unspecified bronchus or lung: Secondary | ICD-10-CM

## 2020-12-02 DIAGNOSIS — E559 Vitamin D deficiency, unspecified: Secondary | ICD-10-CM

## 2020-12-02 DIAGNOSIS — G4734 Idiopathic sleep related nonobstructive alveolar hypoventilation: Secondary | ICD-10-CM | POA: Diagnosis not present

## 2020-12-02 DIAGNOSIS — Z9981 Dependence on supplemental oxygen: Secondary | ICD-10-CM

## 2020-12-02 MED ORDER — SUMATRIPTAN SUCCINATE 100 MG PO TABS: ORAL_TABLET | ORAL | 3 refills | Status: DC

## 2020-12-02 MED ORDER — HYDROCODONE-ACETAMINOPHEN 10-325 MG PO TABS: 1.0000 | ORAL_TABLET | Freq: Four times a day (QID) | ORAL | 0 refills | Status: DC | PRN

## 2020-12-02 MED ORDER — ALPRAZOLAM 0.25 MG PO TABS: 0.2500 mg | ORAL_TABLET | Freq: Every evening | ORAL | 1 refills | Status: DC | PRN

## 2020-12-02 NOTE — Assessment & Plan Note (Signed)
Cont w/O2 at 2.5 l/min [place]

## 2020-12-02 NOTE — Assessment & Plan Note (Signed)
Chronic HAs. Cont w/Imitrex Pt has been taking Aleve prn

## 2020-12-02 NOTE — Assessment & Plan Note (Signed)
F/u w/Dr Ennever 

## 2020-12-02 NOTE — Progress Notes (Signed)
Subjective:  Patient ID: Kaylee Hansen, female    DOB: [birth year]  Age: 65 y.o. MRN: [id number]  CC: Follow-up (3 month f/u)   HPI Alilah L Whiteman presents for LBP, HAs, COPD and lung cancer f/u  Outpatient Medications Prior to Visit  Medication Sig Dispense Refill   albuterol (PROVENTIL) (2.5 MG/3ML) 0.083% nebulizer solution INHALE 1 VIAL BY NEBULIZATION EVERY 4 HOURS AS NEEDED FOR WHEEZING OR SHORTNESS OF BREATH. 150 mL 5   ALPRAZolam (XANAX) 0.25 MG tablet Take 1 tablet (0.25 mg total) by mouth at bedtime as needed for anxiety. 90 tablet 1   bisacodyl (DULCOLAX) 5 MG EC tablet Take 2 tablets (10 mg total) by mouth at bedtime. (Patient taking differently: Take 10 mg by mouth daily as needed.) 30 tablet    clobetasol (TEMOVATE) 0.05 % external solution Apply 1 application topically 2 (two) times daily. For itchy scalp lesions 50 mL 3   clopidogrel (PLAVIX) 75 MG tablet TAKE 1 TABLET (75 MG TOTAL) BY MOUTH DAILY WITH BREAKFAST. 90 tablet 3   [facility] SENNA PLUS 8.6-50 MG tablet Take 2 tablets by mouth at bedtime.     diltiazem (CARDIZEM CD) 360 MG 24 hr capsule Take 1 capsule (360 mg total) by mouth daily. 90 capsule 3   diltiazem (TIAZAC) 360 MG 24 hr capsule diltiazem CD 360 mg capsule,extended release 24 hr  TAKE 1 CAPSULE BY MOUTH EVERY DAY     diphenhydrAMINE HCl, Sleep, (ZZZQUIL) 25 MG CAPS Take 50 mg by mouth at bedtime as needed (sleep).     furosemide (LASIX) 20 MG tablet Take 1 tablet (20 mg total) by mouth daily. 90 tablet 3   guaiFENesin (MUCINEX) 600 MG 12 hr tablet Take 1 tablet (600 mg total) by mouth 2 (two) times daily as needed for cough or to loosen phlegm. 14 tablet 0   HYDROcodone-acetaminophen (NORCO) 10-325 MG tablet Take 1 tablet by mouth every 6 (six) hours as needed for severe pain. Please fill on or after 11/16/20 120 tablet 0   hydroxypropyl methylcellulose / hypromellose (ISOPTO TEARS / GONIOVISC) 2.5 % ophthalmic solution Place 1 drop into both eyes 3 (three) times  daily as needed for dry eyes.     ketoconazole (NIZORAL) 2 % cream APPLY TO AFFECTED AREA EVERY DAY 45 g 1   lactulose (CHRONULAC) 10 GM/15ML solution lactulose 10 gram/15 mL oral solution  TAKE 30 MLS (20 G TOTAL) BY MOUTH DAILY.     levofloxacin (LEVAQUIN) 250 MG tablet Take 1 tablet (250 mg total) by mouth daily. 7 tablet 0   levothyroxine (SYNTHROID) 150 MCG tablet TAKE 1 TABLET BY MOUTH EVERY DAY 90 tablet 3   naloxegol oxalate (MOVANTIK) 12.5 MG TABS tablet Take 1 tablet (12.5 mg total) by mouth daily. 30 tablet 0   nitroGLYCERIN (NITROSTAT) 0.4 MG SL tablet DISSOLVE ONE TABLET UNDER THE TONGUE EVERY 5 MINUTES AS NEEDED FOR CHEST PAIN.  DO NOT EXCEED A TOTAL OF 3 DOSES IN 15 MINUTES (Patient taking differently: DISSOLVE ONE TABLET UNDER THE TONGUE EVERY 5 MINUTES AS NEEDED FOR CHEST PAIN.  DO NOT EXCEED A TOTAL OF 3 DOSES IN 15 MINUTES) 25 tablet 6   OXYGEN Inhale 2.5 L/min into the lungs at bedtime.     pantoprazole (PROTONIX) 40 MG tablet Take 1 tablet (40 mg total) by mouth daily before breakfast. 90 tablet 3   PARoxetine (PAXIL) 40 MG tablet TAKE 1 TABLET BY MOUTH EVERY DAY 90 tablet 3   phenytoin (DILANTIN) 100  MG ER capsule TAKE 100 MG EVERY MORNING AND 200 MG EVERY NIGHT. 270 capsule 3   promethazine (PHENERGAN) 25 MG tablet Take 1 tablet (25 mg total) by mouth every 8 (eight) hours as needed for nausea or vomiting. 30 tablet 0   Respiratory Therapy Supplies (FLUTTER) DEVI As directed 1 each 0   SUMAtriptan (IMITREX) 100 MG tablet MAY REPEAT IN 2 HOURS IF HEADACHE PERSISTS OR RECURS. 9 tablet 3   traZODone (DESYREL) 100 MG tablet TAKE 1 TABLET BY MOUTH EVERYDAY AT BEDTIME 90 tablet 3   albuterol (PROVENTIL HFA;VENTOLIN HFA) 108 (90 Base) MCG/ACT inhaler Inhale 2 puffs into the lungs every 6 (six) hours as needed for wheezing or shortness of breath. 1 Inhaler 5   isosorbide mononitrate (IMDUR) 60 MG 24 hr tablet Take 1 tablet (60 mg total) by mouth daily. 90 tablet 3   rosuvastatin  (CRESTOR) 20 MG tablet Take 1 tablet (20 mg total) by mouth daily. 90 tablet 3   azithromycin (ZITHROMAX) 250 MG tablet azithromycin 250 mg tablet  TAKE 2 TABLETS BY MOUTH TODAY, THEN TAKE 1 TABLET DAILY FOR 4 DAYS (Patient not taking: No sig reported)     doxycycline (VIBRA-TABS) 100 MG tablet doxycycline hyclate 100 mg tablet  TAKE 1 TABLET BY MOUTH TWICE A DAY (Patient not taking: No sig reported)     ergocalciferol (VITAMIN D2) 1.25 MG (50000 UT) capsule ergocalciferol (vitamin D2) 1,250 mcg (50,000 unit) capsule  TAKE 1 CAPSULE (50,000 UNITS TOTAL) BY MOUTH EVERY MONDAY. (Patient not taking: Reported on 12/02/2020)     methylPREDNISolone (MEDROL DOSEPAK) 4 MG TBPK tablet methylprednisolone 4 mg tablets in a dose pack  TAKE 6-5-4-3-2-1. START TODAY (Patient not taking: No sig reported)     sulfamethoxazole-trimethoprim (BACTRIM DS) 800-160 MG tablet sulfamethoxazole 800 mg-trimethoprim 160 mg tablet  TAKE 1 TABLET BY MOUTH TWICE A DAY FOR 7 DAYS (Patient not taking: No sig reported)     No facility-administered medications prior to visit.    ROS: Review of Systems  Constitutional:  Negative for activity change, appetite change, chills, fatigue and unexpected weight change.  HENT:  Positive for congestion. Negative for mouth sores, sinus pressure and trouble swallowing.   Eyes:  Negative for visual disturbance.  Respiratory:  Positive for cough, shortness of breath and wheezing. Negative for chest tightness.   Gastrointestinal:  Negative for abdominal pain, nausea and vomiting.  Genitourinary:  Negative for difficulty urinating, frequency and vaginal pain.  Musculoskeletal:  Negative for back pain and gait problem.  Skin:  Positive for color change. Negative for pallor and rash.  Neurological:  Positive for weakness and light-headedness. Negative for dizziness, tremors, numbness and headaches.  Hematological:  Negative for adenopathy. Bruises/bleeds easily.  Psychiatric/Behavioral:   Positive for sleep disturbance. Negative for confusion and suicidal ideas. The patient is nervous/anxious.    Objective:  BP (!) 122/42 (BP Location: Left Arm)   Pulse 84   Temp 98.3 F (36.8 C) (Oral)   Ht 5\' 6"  (1.676 m)   Wt 160 lb 3.2 oz (72.7 kg)   SpO2 95%   BMI 25.86 kg/m   BP Readings from Last 3 Encounters:  12/02/20 (!) 122/42  11/18/20 (!) 118/35  10/05/20 (!) 128/52    Wt Readings from Last 3 Encounters:  12/02/20 160 lb 3.2 oz (72.7 kg)  10/05/20 163 lb (73.9 kg)  09/21/20 167 lb (75.8 kg)    Physical Exam Constitutional:      General: She is not in acute  distress.    Appearance: She is well-developed.  HENT:     Head: Normocephalic.     Right Ear: External ear normal.     Left Ear: External ear normal.     Nose: Nose normal.  Eyes:     General:        Right eye: No discharge.        Left eye: No discharge.     Conjunctiva/sclera: Conjunctivae normal.     Pupils: Pupils are equal, round, and reactive to light.  Neck:     Thyroid: No thyromegaly.     Vascular: No JVD.     Trachea: No tracheal deviation.  Cardiovascular:     Rate and Rhythm: Normal rate and regular rhythm.     Heart sounds: Murmur heard.  Pulmonary:     Effort: No respiratory distress.     Breath sounds: No stridor. Wheezing and rhonchi present. No rales.  Abdominal:     General: Bowel sounds are normal. There is no distension.     Palpations: Abdomen is soft. There is no mass.     Tenderness: There is no guarding or rebound.  Musculoskeletal:        General: Tenderness present.     Cervical back: Normal range of motion and neck supple. No rigidity.  Lymphadenopathy:     Cervical: No cervical adenopathy.  Skin:    Findings: No erythema or rash.  Neurological:     Cranial Nerves: No cranial nerve deficit.     Motor: Weakness present. No abnormal muscle tone.     Coordination: Coordination abnormal.     Gait: Gait abnormal.     Deep Tendon Reflexes: Reflexes normal.   Psychiatric:        Behavior: Behavior normal.        Thought Content: Thought content normal.        Judgment: Judgment normal.    In a w/c ataxic L neck mole  Lab Results  Component Value Date   WBC 7.2 11/18/2020   HGB 10.3 (L) 11/18/2020   HCT 33.0 (L) 11/18/2020   PLT 247 11/18/2020   GLUCOSE 97 11/18/2020   CHOL 128 06/29/2018   TRIG 184 (H) 06/29/2018   HDL 35 (L) 06/29/2018   LDLDIRECT 187.0 07/14/2015   LDLCALC 56 06/29/2018   ALT 12 11/18/2020   AST 14 (L) 11/18/2020   NA 140 11/18/2020   K 4.4 11/18/2020   CL 102 11/18/2020   CREATININE 0.92 11/18/2020   BUN 11 11/18/2020   CO2 32 11/18/2020   TSH 1.84 01/01/2019   INR 0.9 09/22/2019   HGBA1C  09/17/2008    5.0 (NOTE)   The ADA recommends the following therapeutic goal for glycemic   control related to Hgb A1C measurement:   Goal of Therapy:   < 7.0% Hgb A1C   Reference: American Diabetes Association: Clinical Practice   Recommendations 2008, Diabetes Care,  2008, 31:(Suppl 1).    NM PET Image Restag (PS) Skull Base To Thigh  Result Date: 11/17/2020 CLINICAL DATA:  Subsequent treatment strategy for non-small cell lung cancer. EXAM: NUCLEAR MEDICINE PET SKULL BASE TO THIGH TECHNIQUE: 8.1 mCi F-18 FDG was injected intravenously. Full-ring PET imaging was performed from the skull base to thigh after the radiotracer. CT data was obtained and used for attenuation correction and anatomic localization. Fasting blood glucose: 95 mg/dl COMPARISON:  None. FINDINGS: Mediastinal blood pool activity: SUV max 1.7 Liver activity: SUV max NA NECK: No hypermetabolic lymph  nodes in the neck. Incidental CT findings: none CHEST: Again demonstrated sub solid nodule in the LEFT upper lobe measuring 10 mm (image 14/CT series 17) compared to 9 mm on prior. No associated metabolic activity. Band like atelectasis in the lingula without metabolic activity. 4 mm nodule in the superior segment of the LEFT lower lobe (image 23/17) is unchanged  from CT 03/17/2020. Perihilar consolidation superior to the RIGHT hilum is unchanged and without metabolic activity. No hypermetabolic pulmonary nodules. No hypermetabolic mediastinal lymph nodes. No hypermetabolic supraclavicular nodes or hilar nodes. Incidental CT findings: Coronary artery calcification and aortic atherosclerotic calcification. ABDOMEN/PELVIS: No abnormal hypermetabolic activity within the liver, pancreas, adrenal glands, or spleen. No hypermetabolic lymph nodes in the abdomen or pelvis. Scattered activity within the colon small bowel similar prior. Incidental CT findings: Post cholecystectomy and hysterectomy. SKELETON: No aggressive osseous lesion. Physiologic activity noted in the posterior neck musculature and paraspinal musculature. Incidental CT findings: none IMPRESSION: 1. No evidence of lung cancer recurrence in the thorax. 2. No evidence of distant metastatic disease. 3. Stable ground-glass nodule in the LEFT upper lobe. Stable nodule in superior segment LEFT lower lobe. Recommend attention on routine surveillance. 4. Band like atelectasis in the lingula without metabolic activity is unchanged. 5. Perihilar consolidation in the RIGHT upper lobes unchanged. 6. Physiologic activity noted in musculature and bowel. Electronically Signed   By: Suzy Bouchard M.D.   On: 11/17/2020 09:25    Assessment & Plan:   There are no diagnoses linked to this encounter.   Walker Kehr, MD

## 2020-12-02 NOTE — Assessment & Plan Note (Signed)
Cont w/Vit D 

## 2020-12-02 NOTE — Assessment & Plan Note (Signed)
On Furosemide 

## 2020-12-02 NOTE — Assessment & Plan Note (Signed)
L neck mole - bx suggested

## 2020-12-02 NOTE — Assessment & Plan Note (Signed)
Chronic Cont w/Norco  Potential benefits of a long term opioids use as well as potential risks (i.e. addiction risk, apnea etc) and complications (i.e. Somnolence, constipation and others) were explained to the patient and were aknowledged. Narcane nasal prn Do not take Norco w/Xanax

## 2020-12-02 NOTE — Assessment & Plan Note (Signed)
Cont w/O2 at 2.5 l/min [place]

## 2020-12-02 NOTE — Assessment & Plan Note (Signed)
Cont w/O2 at 2.5 l/min [place]

## 2020-12-04 ENCOUNTER — Other Ambulatory Visit: Payer: Self-pay | Admitting: Internal Medicine

## 2020-12-17 DIAGNOSIS — C3490 Malignant neoplasm of unspecified part of unspecified bronchus or lung: Secondary | ICD-10-CM | POA: Diagnosis not present

## 2020-12-20 ENCOUNTER — Telehealth: Payer: Self-pay | Admitting: Cardiology

## 2020-12-20 NOTE — Telephone Encounter (Signed)
Pt c/o of Chest Pain: STAT if CP now or developed within 24 hours  1. Are you having CP right now? yes  2. Are you experiencing any other symptoms (ex. SOB, nausea, vomiting, sweating)? SOB, had nausea once, sweating  3. How long have you been experiencing CP? Since Friday or Saturday  4. Is your CP continuous or coming and going? Comes and goes  5. Have you taken Nitroglycerin? Yes, did not help ?    Patient states she has been having pain and pressure in her chest. She states she thinks it started Friday or Saturday. She states she has been having SOB and had nausea once, but did not vomit. She states she has also been sweating.

## 2020-12-20 NOTE — Telephone Encounter (Signed)
Received stat call into triage from patient with complaints of chest pain. Patient states that she has been having chest pain for the last 3 days. Patient states it is in the center of her chest and towards the left side at times. Patient states that nothing has helped to alleviate the pain. Patient is also audibly short of breath on the phone. Patient states that this chest pain is not consistent with chest pain she has had in the past. Patient states that it is new and worse. Advised patient to proceed to the ER for immediate evaluation. Patient would not like to go by EMS, and would like to go to [facility] so per patient she will have her husband take her now to be seen. Advised patient that I would forward message to Dr. Ellyn Hack to make him aware. Patient verbalized understanding.

## 2021-01-06 ENCOUNTER — Telehealth: Payer: Self-pay | Admitting: *Deleted

## 2021-01-06 NOTE — Chronic Care Management (AMB) (Signed)
  Chronic Care Management   Note  01/06/2021 Name: Kaylee Hansen MRN: [id number] DOB: 1955/11/05  Kaylee Hansen is a 65 y.o. year old female who is a primary care patient of Plotnikov, [name], MD. I reached out to [employer] by phone today in response to a referral sent by [name]'s PCP Plotnikov, [name], MD     [name] was given information about Chronic Care Management services today including:  CCM service includes personalized support from designated clinical staff supervised by her physician, including individualized plan of care and coordination with other care providers 24/7 contact phone numbers for assistance for urgent and routine care needs. Service will only be billed when office clinical staff spend 20 minutes or more in a month to coordinate care. Only one practitioner may furnish and bill the service in a calendar month. The patient may stop CCM services at any time (effective at the end of the month) by phone call to the office staff. The patient will be responsible for cost sharing (co-pay) of up to 20% of the service fee (after annual deductible is met).  Patient agreed to services and verbal consent obtained.   Follow up plan: Telephone appointment with care management team member scheduled for:01/24/2021  Julian Hy, [place] Management  Direct Dial: [phone]

## 2021-01-13 ENCOUNTER — Telehealth: Payer: Self-pay | Admitting: Internal Medicine

## 2021-01-13 DIAGNOSIS — R300 Dysuria: Secondary | ICD-10-CM

## 2021-01-13 NOTE — Telephone Encounter (Signed)
Called pt spoke w/husband enter order for UA. Husband states she is still in the bed and will bring her tomorrow.Marland KitchenJohny Chess

## 2021-01-13 NOTE — Telephone Encounter (Signed)
   Patient calling to report painful, frequent urination since 7/20 No available appointments for today Can urine test be ordered?

## 2021-01-14 ENCOUNTER — Other Ambulatory Visit (INDEPENDENT_AMBULATORY_CARE_PROVIDER_SITE_OTHER): Payer: Medicare HMO

## 2021-01-14 DIAGNOSIS — R300 Dysuria: Secondary | ICD-10-CM | POA: Diagnosis not present

## 2021-01-14 LAB — URINALYSIS, ROUTINE W REFLEX MICROSCOPIC
Bilirubin Urine: NEGATIVE
Ketones, ur: NEGATIVE
Nitrite: POSITIVE — AB
Specific Gravity, Urine: 1.02 (ref 1.000–1.030)
Urine Glucose: NEGATIVE
Urobilinogen, UA: 0.2 (ref 0.0–1.0)
pH: 6 (ref 5.0–8.0)

## 2021-01-15 ENCOUNTER — Other Ambulatory Visit: Payer: Self-pay | Admitting: Internal Medicine

## 2021-01-15 MED ORDER — FLUCONAZOLE 150 MG PO TABS: 150.0000 mg | ORAL_TABLET | Freq: Once | ORAL | 0 refills | Status: DC

## 2021-01-15 MED ORDER — CIPROFLOXACIN HCL 500 MG PO TABS: 500.0000 mg | ORAL_TABLET | Freq: Two times a day (BID) | ORAL | 0 refills | Status: DC

## 2021-01-15 NOTE — Telephone Encounter (Signed)
I have sent a prescription in for Cipro and [facility]lucan to [facility].  Thanks

## 2021-01-16 DIAGNOSIS — C3490 Malignant neoplasm of unspecified part of unspecified bronchus or lung: Secondary | ICD-10-CM | POA: Diagnosis not present

## 2021-01-19 ENCOUNTER — Telehealth: Payer: Self-pay | Admitting: Internal Medicine

## 2021-01-19 NOTE — Chronic Care Management (AMB) (Signed)
  Chronic Care Management   Outreach Note  01/19/2021 Name: Kaylee Hansen MRN: [id number] DOB: Mar 14, 1956  Referred by: Cassandria Anger, MD Reason for referral : No chief complaint on file.   An unsuccessful telephone outreach was attempted today. The patient was referred to the pharmacist for assistance with care management and care coordination.   Follow Up Plan:   Lauretta Grill Upstream Scheduler

## 2021-01-24 ENCOUNTER — Ambulatory Visit (INDEPENDENT_AMBULATORY_CARE_PROVIDER_SITE_OTHER): Payer: Medicare HMO | Admitting: *Deleted

## 2021-01-24 DIAGNOSIS — C3490 Malignant neoplasm of unspecified part of unspecified bronchus or lung: Secondary | ICD-10-CM

## 2021-01-24 DIAGNOSIS — J449 Chronic obstructive pulmonary disease, unspecified: Secondary | ICD-10-CM

## 2021-01-24 NOTE — Patient Instructions (Signed)
Visit Information   Kaylee Hansen, it was nice talking with you today.   Please read over the attached information, and start now to try and decrease your salt intake   I look forward to talking to you again for an update on Monday March 14, 2021 at 11:30 am - please be listening out for my call that day.  I will call as close to 11:30 am as possible.  If you need to cancel or re-schedule our telephone visit, please call [phone] and one of our care guides will be happy to assist you.  I look forward to hearing about your progress.   Please don't hesitate to contact me if I can be of assistance to you before our next scheduled appointment.   Kaylee Rack, RN, BSN, [facility] RN Care Coordination- [place] [phone]: direct office [phone]: mobile    PATIENT GOALS:   Goals Addressed             This Visit's Progress    Track and Manage My Symptoms-COPD   On track    Timeframe:  Long-Range Goal Priority:  Medium Start Date:          01/24/21                   Expected End Date:     01/24/22                  Follow Up Date 03/14/2021    Develop and follow a rescue plan if symptoms flare up: please contact your care provider promptly if you believe your breathing is starting to get worse Eliminate symptom triggers at home: please do not smoke near your home Oxygen concentrator; please try to decrease and stop smoking- this is the most important thing you can do to help your breathing Keep all doctor and follow-up appointments- please make sure to communicate your healthcare needs and any problems you are having with your doctors and your care team Please try to limit the salt in your diet: eating salt can make you retain fluid, which can make your breathing worse I have made Dr. Alain Marion aware that you have reported ongoing depression today, so he can decide if he would like to adjust or change your depression medication: please discuss  this with Dr. Alain Marion when you attend your next office visit with him on March 10, 2021 Please listen out for a call from the Pharmacy team at Dr. Judeen Hammans office: they have been trying to contact you to schedule a review of your medications   Why is this important?   Tracking your symptoms and other information about your health helps your doctor plan your care.  Write down the symptoms, the time of day, what you were doing and what medicine you are taking.  You will soon learn how to manage your symptoms.            Low-Sodium Eating Plan Sodium, which is an element that makes up salt, helps you maintain a healthy balance of fluids in your body. Too much sodium can increase your bloodpressure and cause fluid and waste to be held in your body. Your health care provider or dietitian may recommend following this plan if you have high blood pressure (hypertension), kidney disease, liver disease, or heart failure. Eating less sodium can help lower your blood pressure, reduce swelling, and protect your heart, liver, andkidneys. What are tips for following this plan? Reading food  labels The Nutrition Facts label lists the amount of sodium in one serving of the food. If you eat more than one serving, you must multiply the listed amount of sodium by the number of servings. Choose foods with less than 140 mg of sodium per serving. Avoid foods with 300 mg of sodium or more per serving. Shopping  Look for lower-sodium products, often labeled as "low-sodium" or "no salt added." Always check the sodium content, even if foods are labeled as "unsalted" or "no salt added." Buy fresh foods. Avoid canned foods and pre-made or frozen meals. Avoid canned, cured, or processed meats. Buy breads that have less than 80 mg of sodium per slice.  Cooking  Eat more home-cooked food and less restaurant, buffet, and fast food. Avoid adding salt when cooking. Use salt-free seasonings or herbs instead of  table salt or sea salt. Check with your health care provider or pharmacist before using salt substitutes. Cook with plant-based oils, such as canola, sunflower, or olive oil.  Meal planning When eating at a restaurant, ask that your food be prepared with less salt or no salt, if possible. Avoid dishes labeled as brined, pickled, cured, smoked, or made with soy sauce, miso, or teriyaki sauce. Avoid foods that contain MSG (monosodium glutamate). MSG is sometimes added to [place] food, bouillon, and some canned foods. Make meals that can be grilled, baked, poached, roasted, or steamed. These are generally made with less sodium. General information Most people on this plan should limit their sodium intake to 1,500-2,000 mg (milligrams) of sodium each day. What foods should I eat? Fruits Fresh, frozen, or canned fruit. Fruit juice. Vegetables Fresh or frozen vegetables. "No salt added" canned vegetables. "No salt added"tomato sauce and paste. Low-sodium or reduced-sodium tomato and vegetable juice. Grains Low-sodium cereals, including oats, puffed wheat and rice, and shredded wheat. Low-sodium crackers. Unsalted rice. Unsalted pasta. Low-sodium bread.Whole-grain breads and whole-grain pasta. Meats and other proteins Fresh or frozen (no salt added) meat, poultry, seafood, and fish. Low-sodium canned tuna and salmon. Unsalted nuts. Dried peas, beans, and lentils withoutadded salt. Unsalted canned beans. Eggs. Unsalted nut butters. Dairy Milk. Soy milk. Cheese that is naturally low in sodium, such as ricotta cheese, fresh mozzarella, or Swiss cheese. Low-sodium or reduced-sodium cheese. Creamcheese. Yogurt. Seasonings and condiments Fresh and dried herbs and spices. Salt-free seasonings. Low-sodium mustard and ketchup. Sodium-free salad dressing. Sodium-free light mayonnaise. Fresh orrefrigerated horseradish. Lemon juice. Vinegar. Other foods Homemade, reduced-sodium, or low-sodium soups. Unsalted  popcorn and pretzels.Low-salt or salt-free chips. The items listed above may not be a complete list of foods and beverages you can eat. Contact a dietitian for more information. What foods should I avoid? Vegetables Sauerkraut, pickled vegetables, and relishes. Olives. [place] fries. Onion rings. Regular canned vegetables (not low-sodium or reduced-sodium). Regular canned tomato sauce and paste (not low-sodium or reduced-sodium). Regular tomato and vegetable juice (not low-sodium or reduced-sodium). Frozenvegetables in sauces. Grains Instant hot cereals. Bread stuffing, pancake, and biscuit mixes. Croutons. Seasoned rice or pasta mixes. Noodle soup cups. Boxed or frozen macaroni andcheese. Regular salted crackers. Self-rising flour. Meats and other proteins Meat or fish that is salted, canned, smoked, spiced, or pickled. Precooked or cured meat, such as sausages or meat loaves. Berniece Salines. Ham. Pepperoni. Hot dogs. Corned beef. Chipped beef. Salt pork. Jerky. Pickled herring. Anchovies andsardines. Regular canned tuna. Salted nuts. Dairy Processed cheese and cheese spreads. Hard cheeses. Cheese curds. Blue cheese.Feta cheese. String cheese. Regular cottage cheese. Buttermilk. Canned milk. Fats and oils Salted  butter. Regular margarine. Ghee. Bacon fat. Seasonings and condiments Onion salt, garlic salt, seasoned salt, table salt, and sea salt. Canned and packaged gravies. Worcestershire sauce. Tartar sauce. Barbecue sauce. Teriyaki sauce. Soy sauce, including reduced-sodium. Steak sauce. Fish sauce. Oyster sauce. Cocktail sauce. Horseradish that you find on the shelf. Regular ketchup and mustard. Meat flavorings and tenderizers. Bouillon cubes. Hot sauce. Pre-made or packaged marinades. Pre-made or packaged taco seasonings. Relishes.Regular salad dressings. Salsa. Other foods Salted popcorn and pretzels. Corn chips and puffs. Potato and tortilla chips.Canned or dried soups. Pizza. Frozen entrees and pot  pies. The items listed above may not be a complete list of foods and beverages you should avoid. Contact a dietitian for more information. Summary Eating less sodium can help lower your blood pressure, reduce swelling, and protect your heart, liver, and kidneys. Most people on this plan should limit their sodium intake to 1,500-2,000 mg (milligrams) of sodium each day. Canned, boxed, and frozen foods are high in sodium. Restaurant foods, fast foods, and pizza are also very high in sodium. You also get sodium by adding salt to food. Try to cook at home, eat more fresh fruits and vegetables, and eat less fast food and canned, processed, or prepared foods. This information is not intended to replace advice given to you by your health care provider. Make sure you discuss any questions you have with your healthcare provider. Document Revised: 07/18/2019 Document Reviewed: 05/14/2019 Elsevier Patient Education  2022 [place].  Critical care medicine: Principles of diagnosis and management in the adult (4th ed., pp. 0932-6712). [place]unders."> Miller's anesthesia (8th ed., pp. 232-250). Saunders.">  Advance Directive  Advance directives are legal documents that allow you to make decisions about your health care and medical treatment in case you become unable to communicate for yourself. Advance directives let your wishes be known to family, friends,and health care providers. Discussing and writing advance directives should happen over time rather than all at once. Advance directives can be changed and updated at any time. There are different types of advance directives, such as: Medical power of attorney. Living will. Do not resuscitate (DNR) order or do not attempt resuscitation (DNAR) order. Health care proxy and medical power of attorney A health care proxy is also called a health care agent. This person is appointed to make medical decisions for you when you are unable to make decisions for  yourself. Generally, people ask a trusted friend or family member to act as their proxy and represent their preferences. Make sure you have an agreement with your trusted person to act as your proxy. A proxy may have tomake a medical decision on your behalf if your wishes are not known. A medical power of attorney, also called a durable power of attorney for health care, is a legal document that names your health care proxy. Depending on the laws in your state, the document may need to be: Signed. Notarized. Dated. Copied. Witnessed. Incorporated into your medical record. You may also want to appoint a trusted person to manage your money in the event you are unable to do so. This is called a durable power of attorney for finances. It is a separate legal document from the durable power of attorney for health care. You may choose your health care proxy or someone different toact as your agent in money matters. If you do not appoint a proxy, or there is a concern that the proxy is not acting in your best interest, a court may appoint a  guardian to act on yourbehalf. Living will A living will is a set of instructions that state your wishes about medical care when you cannot express them yourself. Health care providers should keep a copy of your living will in your medical record. You may want to give a copy to family members or friends. To alert caregivers in case of an emergency, you can place a card in your wallet to let them know that you have a living will and where they can find it. A living will is used if you become: Terminally ill. Disabled. Unable to communicate or make decisions. The following decisions should be included in your living will: To use or not to use life support equipment, such as dialysis machines and breathing machines (ventilators). Whether you want a DNR or DNAR order. This tells health care providers not to use cardiopulmonary resuscitation (CPR) if breathing or heartbeat  stops. To use or not to use tube feeding. To be given or not to be given food and fluids. Whether you want comfort (palliative) care when the goal becomes comfort rather than a cure. Whether you want to donate your organs and tissues. A living will does not give instructions for distributing your money andproperty if you should pass away. DNR or DNAR A DNR or DNAR order is a request not to have CPR in the event that your heart stops beating or you stop breathing. If a DNR or DNAR order has not been made and shared, a health care provider will try to help any patient whose heart has stopped or who has stopped breathing. If you plan to have surgery, talk with your health care provider about how your DNR or DNAR order will be followed ifproblems occur. What if I do not have an advance directive? Some states assign family decision makers to act on your behalf if you do not have an advance directive. Each state has its own laws about advance directives. You may want to check with your health care provider, attorney, orstate representative about the laws in your state. Summary Advance directives are legal documents that allow you to make decisions about your health care and medical treatment in case you become unable to communicate for yourself. The process of discussing and writing advance directives should happen over time. You can change and update advance directives at any time. Advance directives may include a medical power of attorney, a living will, and a DNR or DNAR order. This information is not intended to replace advice given to you by your health care provider. Make sure you discuss any questions you have with your healthcare provider. Document Revised: 03/16/2020 Document Reviewed: 03/16/2020 Elsevier Patient Education  2022 [employer].  Consent to CCM Services: Ms. Napp was given information about Chronic Care Management services 01/06/21 including:  CCM service includes personalized  support from designated clinical staff supervised by her physician, including individualized plan of care and coordination with other care providers 24/7 contact phone numbers for assistance for urgent and routine care needs. Service will only be billed when office clinical staff spend 20 minutes or more in a month to coordinate care. Only one practitioner may furnish and bill the service in a calendar month. The patient may stop CCM services at any time (effective at the end of the month) by phone call to the office staff. The patient will be responsible for cost sharing (co-pay) of up to 20% of the service fee (after annual deductible is met).  Patient agreed to  services and verbal consent obtained.   Patient verbalizes understanding of instructions provided today and agrees to view in [place].  Telephone follow up appointment with care management team member scheduled for:  Monday March 14, 2021 at 11: The patient has been provided with contact information for the care management team and has been advised to call with any health related questions or concerns.   Kaylee Rack, RN, BSN, [facility] RN Care Coordination- [place] [phone]: direct office [phone]: mobile   CLINICAL CARE PLAN: Patient Care Plan: COPD (Adult)     Problem Identified: Symptom Exacerbation (COPD)   Priority: Medium     Long-Range Goal: Symptom Exacerbation Prevented or Minimized   Start Date: 01/24/2021  Expected End Date: 01/24/2022  This Visit's Progress: On track  Priority: Medium  Note:   Current Barriers:  Knowledge deficits related to basic COPD self care/management Does not adhere to provider recommendations re: smoking cessation- continues to smoke 8-10 cigarettes per day Unable to perform ADLs/ iADL's independently: supportive husband assists as indicated High fall risk: reports "weekly" falls despite use of assistive devices, including motorized  wheelchair, walker Fragile state of health, multiple progressing chronic health conditions  Case Manager Clinical Goal(s): 01/24/21: Over the next 12 months, patient/ caregiver will verbalize understanding of COPD action plan and when to seek appropriate levels of medical care as evidenced by patient/ caregiver reporting during CCM RN CM outreach Interventions:  Collaboration with Plotnikov, [name], MD regarding development and update of comprehensive plan of care as evidenced by provider attestation and co-signature Inter-disciplinary care team collaboration (see longitudinal plan of care) Chart reviewed including relevant office notes, upcoming scheduled appointments, and lab results Discussed current  clinical condition with patient/ caregiver- husband while phone on speaker mode and confirmed no current clinical concerns; confirmed caregiver manages medications for patient; patient and caregiver report good adherence to medication regimen and deny current medication concerns; encouraged patient to engage with CCM Pharmacy team, they have reached out to patient for medication review: she is agreeable; encouraged her to make a list of any medication concerns she may have prior to scheduled call with CCM Pharmacy team Reviewed recent PCP office visit with patient/ caregiver: reports  good general understanding of post-office visit instructions: confirmed that she is taking antibiotics recently prescribed for presumed UTI; reports "unsure" if she is getting better Discussed management of chronic pain with patient, who reports chronic constipation/ abdominal pain: she reports that she takes narcotics regularly, and side of effect of constipation was discussed; she currently uses stool softeners and occasional enemas to manage chronic constipation due to narcotic use Basic overview and discussion of pathophysiology of COPD in setting of lung CA; patient/ caregiver verbalize good baseline understanding of  around same but could benefit from ongoing reinforcement-- patient reports she continues to smoke 8-10 cigarettes/ per day: smoking cessation was again encouraged, patient does not appear interested in smoking cessation Confirmed patient using nebulizer daily and rescue inhaler weekly: encouraged her to discuss her normal use with pharmacist at time of CCM pharmacy team appointment Assessed need for readable accurate scales in home: confirmed patient has scales and is monitoring weights daily but does not understand why: rationale provided Provided verbal education around signs/ symptoms yellow COPD zone along with corresponding action plan:  patient/ caregiver will require ongoing reinforcement around same; confirmed that patient reports she does not smoke near her home oxygen concentrator- currently using home O2 via [place] 2.5- 3 L/min  qHS and prn throughout daytime hours Provided patient with verbal education around COPD action plan and reinforced importance of daily self assessment and prompt notification of care providers when flares/ problems arise Confirmed patient endorses following regular diet without no restrictions: advantages of following low salt, heart healthy diet in COPD management discussed with patient; verbal education initiated around strategies to decrease salt intake- will provide printed/ written education as well Reviewed upcoming provider appointments with patient/ caregiver and confirmed that patient has plans to attend all as scheduled: 01/26/21: oncology provider office visit; 03/22/21: cardiology provider office visit- patient verbalizes plans to attend all as scheduled Self-Care Activities:  Patient verbalizes understanding of plan to discuss ongoing depression with PCP; engage with CCM Pharmacy team for medication review Attends all scheduled provider appointments Calls provider office for new concerns or questions Patient Goals: Develop and follow a rescue plan if symptoms  flare up: please contact your care provider promptly if you believe your breathing is starting to get worse Eliminate symptom triggers at home: please do not smoke near your home Oxygen concentrator; please try to decrease and stop smoking- this is the most important thing you can do to help your breathing Keep all doctor and follow-up appointments- please make sure to communicate your healthcare needs and any problems you are having with your doctors and your care team Please try to limit the salt in your diet: eating salt can make you retain fluid, which can make your breathing worse I have made Dr. Alain Marion aware that you have reported ongoing depression today, so he can decide if he would like to adjust or change your depression medication: please discuss this with Dr. Alain Marion when you attend your next office visit with him on March 10, 2021 Please listen out for a call from the Pharmacy team at Dr. Judeen Hammans office: they have been trying to contact you to schedule a review of your medications Follow Up Plan:  Telephone follow up appointment with care management team member scheduled for:  Monday March 14, 2021 at 11:30 am The patient has been provided with contact information for the care management team and has been advised to call with any health related questions or concerns
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
## 2021-01-26 ENCOUNTER — Emergency Department (HOSPITAL_BASED_OUTPATIENT_CLINIC_OR_DEPARTMENT_OTHER)
Admission: EM | Admit: 2021-01-26 | Discharge: 2021-01-26 | Disposition: A | Discharge status: Home / Self Care | Payer: Medicare HMO | Attending: Emergency Medicine | Admitting: Emergency Medicine

## 2021-01-26 ENCOUNTER — Emergency Department (HOSPITAL_BASED_OUTPATIENT_CLINIC_OR_DEPARTMENT_OTHER): Payer: Medicare HMO

## 2021-01-26 ENCOUNTER — Inpatient Hospital Stay: Payer: Medicare HMO | Attending: Hematology & Oncology

## 2021-01-26 ENCOUNTER — Telehealth: Payer: Self-pay

## 2021-01-26 ENCOUNTER — Encounter: Payer: Self-pay | Admitting: Hematology & Oncology

## 2021-01-26 ENCOUNTER — Other Ambulatory Visit: Payer: Self-pay

## 2021-01-26 ENCOUNTER — Encounter (HOSPITAL_BASED_OUTPATIENT_CLINIC_OR_DEPARTMENT_OTHER): Payer: Self-pay

## 2021-01-26 ENCOUNTER — Inpatient Hospital Stay: Payer: Medicare HMO | Admitting: Hematology & Oncology

## 2021-01-26 VITALS — BP 136/40 | HR 81 | Temp 98.7°F | Resp 28 | Wt 164.4 lb

## 2021-01-26 DIAGNOSIS — I2510 Atherosclerotic heart disease of native coronary artery without angina pectoris: Secondary | ICD-10-CM | POA: Diagnosis not present

## 2021-01-26 DIAGNOSIS — J449 Chronic obstructive pulmonary disease, unspecified: Secondary | ICD-10-CM | POA: Diagnosis not present

## 2021-01-26 DIAGNOSIS — Z79899 Other long term (current) drug therapy: Secondary | ICD-10-CM | POA: Diagnosis not present

## 2021-01-26 DIAGNOSIS — D500 Iron deficiency anemia secondary to blood loss (chronic): Secondary | ICD-10-CM

## 2021-01-26 DIAGNOSIS — Z9049 Acquired absence of other specified parts of digestive tract: Secondary | ICD-10-CM | POA: Diagnosis not present

## 2021-01-26 DIAGNOSIS — S7002XA Contusion of left hip, initial encounter: Secondary | ICD-10-CM | POA: Insufficient documentation

## 2021-01-26 DIAGNOSIS — Z20822 Contact with and (suspected) exposure to covid-19: Secondary | ICD-10-CM | POA: Insufficient documentation

## 2021-01-26 DIAGNOSIS — C3490 Malignant neoplasm of unspecified part of unspecified bronchus or lung: Secondary | ICD-10-CM

## 2021-01-26 DIAGNOSIS — F17210 Nicotine dependence, cigarettes, uncomplicated: Secondary | ICD-10-CM | POA: Diagnosis not present

## 2021-01-26 DIAGNOSIS — Z043 Encounter for examination and observation following other accident: Secondary | ICD-10-CM | POA: Diagnosis not present

## 2021-01-26 DIAGNOSIS — Y92091 Bathroom in other non-institutional residence as the place of occurrence of the external cause: Secondary | ICD-10-CM | POA: Diagnosis not present

## 2021-01-26 DIAGNOSIS — Z96642 Presence of left artificial hip joint: Secondary | ICD-10-CM | POA: Diagnosis not present

## 2021-01-26 DIAGNOSIS — J441 Chronic obstructive pulmonary disease with (acute) exacerbation: Secondary | ICD-10-CM

## 2021-01-26 DIAGNOSIS — M545 Low back pain, unspecified: Secondary | ICD-10-CM | POA: Diagnosis not present

## 2021-01-26 DIAGNOSIS — D649 Anemia, unspecified: Secondary | ICD-10-CM | POA: Insufficient documentation

## 2021-01-26 DIAGNOSIS — R7989 Other specified abnormal findings of blood chemistry: Secondary | ICD-10-CM | POA: Diagnosis not present

## 2021-01-26 DIAGNOSIS — Z85118 Personal history of other malignant neoplasm of bronchus and lung: Secondary | ICD-10-CM | POA: Diagnosis not present

## 2021-01-26 DIAGNOSIS — I10 Essential (primary) hypertension: Secondary | ICD-10-CM | POA: Diagnosis not present

## 2021-01-26 DIAGNOSIS — R0602 Shortness of breath: Secondary | ICD-10-CM

## 2021-01-26 DIAGNOSIS — M25552 Pain in left hip: Secondary | ICD-10-CM | POA: Diagnosis not present

## 2021-01-26 DIAGNOSIS — W19XXXA Unspecified fall, initial encounter: Secondary | ICD-10-CM | POA: Insufficient documentation

## 2021-01-26 DIAGNOSIS — S300XXA Contusion of lower back and pelvis, initial encounter: Secondary | ICD-10-CM | POA: Insufficient documentation

## 2021-01-26 DIAGNOSIS — S3992XA Unspecified injury of lower back, initial encounter: Secondary | ICD-10-CM | POA: Diagnosis present

## 2021-01-26 DIAGNOSIS — E039 Hypothyroidism, unspecified: Secondary | ICD-10-CM | POA: Diagnosis not present

## 2021-01-26 DIAGNOSIS — R911 Solitary pulmonary nodule: Secondary | ICD-10-CM | POA: Diagnosis not present

## 2021-01-26 LAB — CBC WITH DIFFERENTIAL (CANCER CENTER ONLY)
Abs Immature Granulocytes: 0.01 10*3/uL (ref 0.00–0.07)
Basophils Absolute: 0.1 10*3/uL (ref 0.0–0.1)
Basophils Relative: 1 %
Eosinophils Absolute: 0.2 10*3/uL (ref 0.0–0.5)
Eosinophils Relative: 3 %
HCT: 30.1 % — ABNORMAL LOW (ref 36.0–46.0)
Hemoglobin: 9.3 g/dL — ABNORMAL LOW (ref 12.0–15.0)
Immature Granulocytes: 0 %
Lymphocytes Relative: 24 %
Lymphs Abs: 1.5 10*3/uL (ref 0.7–4.0)
MCH: 30.2 pg (ref 26.0–34.0)
MCHC: 30.9 g/dL (ref 30.0–36.0)
MCV: 97.7 fL (ref 80.0–100.0)
Monocytes Absolute: 0.5 10*3/uL (ref 0.1–1.0)
Monocytes Relative: 8 %
Neutro Abs: 3.7 10*3/uL (ref 1.7–7.7)
Neutrophils Relative %: 64 %
Platelet Count: 265 10*3/uL (ref 150–400)
RBC: 3.08 MIL/uL — ABNORMAL LOW (ref 3.87–5.11)
RDW: 14.2 % (ref 11.5–15.5)
WBC Count: 5.9 10*3/uL (ref 4.0–10.5)
nRBC: 0 % (ref 0.0–0.2)

## 2021-01-26 LAB — COMPREHENSIVE METABOLIC PANEL
ALT: 14 U/L (ref 0–44)
AST: 17 U/L (ref 15–41)
Albumin: 3.3 g/dL — ABNORMAL LOW (ref 3.5–5.0)
Alkaline Phosphatase: 118 U/L (ref 38–126)
Anion gap: 7 (ref 5–15)
BUN: 14 mg/dL (ref 8–23)
CO2: 30 mmol/L (ref 22–32)
Calcium: 8.2 mg/dL — ABNORMAL LOW (ref 8.9–10.3)
Chloride: 101 mmol/L (ref 98–111)
Creatinine, Ser: 1 mg/dL (ref 0.44–1.00)
GFR, Estimated: >60 mL/min (ref >60)
Glucose, Bld: 122 mg/dL — ABNORMAL HIGH (ref 70–99)
Potassium: 4.3 mmol/L (ref 3.5–5.1)
Sodium: 138 mmol/L (ref 135–145)
Total Bilirubin: 0.1 mg/dL — ABNORMAL LOW (ref 0.3–1.2)
Total Protein: 6.9 g/dL (ref 6.5–8.1)

## 2021-01-26 LAB — RETICULOCYTES
Immature Retic Fract: 22.9 % — ABNORMAL HIGH (ref 2.3–15.9)
RBC.: 3.11 MIL/uL — ABNORMAL LOW (ref 3.87–5.11)
Retic Count, Absolute: 49.8 10*3/uL (ref 19.0–186.0)
Retic Ct Pct: 1.6 % (ref 0.4–3.1)

## 2021-01-26 LAB — CBC WITH DIFFERENTIAL/PLATELET
Abs Immature Granulocytes: 0.02 10*3/uL (ref 0.00–0.07)
Basophils Absolute: 0.1 10*3/uL (ref 0.0–0.1)
Basophils Relative: 1 %
Eosinophils Absolute: 0.2 10*3/uL (ref 0.0–0.5)
Eosinophils Relative: 3 %
HCT: 29.3 % — ABNORMAL LOW (ref 36.0–46.0)
Hemoglobin: 9.2 g/dL — ABNORMAL LOW (ref 12.0–15.0)
Immature Granulocytes: 0 %
Lymphocytes Relative: 21 %
Lymphs Abs: 1.2 10*3/uL (ref 0.7–4.0)
MCH: 30.4 pg (ref 26.0–34.0)
MCHC: 31.4 g/dL (ref 30.0–36.0)
MCV: 96.7 fL (ref 80.0–100.0)
Monocytes Absolute: 0.5 10*3/uL (ref 0.1–1.0)
Monocytes Relative: 8 %
Neutro Abs: 3.8 10*3/uL (ref 1.7–7.7)
Neutrophils Relative %: 67 %
Platelets: 250 10*3/uL (ref 150–400)
RBC: 3.03 MIL/uL — ABNORMAL LOW (ref 3.87–5.11)
RDW: 14.4 % (ref 11.5–15.5)
WBC: 5.8 10*3/uL (ref 4.0–10.5)
nRBC: 0 % (ref 0.0–0.2)

## 2021-01-26 LAB — CMP (CANCER CENTER ONLY)
ALT: 12 U/L (ref 0–44)
AST: 15 U/L (ref 15–41)
Albumin: 3.7 g/dL (ref 3.5–5.0)
Alkaline Phosphatase: 115 U/L (ref 38–126)
Anion gap: 6 (ref 5–15)
BUN: 14 mg/dL (ref 8–23)
CO2: 32 mmol/L (ref 22–32)
Calcium: 9.2 mg/dL (ref 8.9–10.3)
Chloride: 102 mmol/L (ref 98–111)
Creatinine: 1.08 mg/dL — ABNORMAL HIGH (ref 0.44–1.00)
GFR, Estimated: 56 mL/min — ABNORMAL LOW (ref >60)
Glucose, Bld: 84 mg/dL (ref 70–99)
Potassium: 4.5 mmol/L (ref 3.5–5.1)
Sodium: 140 mmol/L (ref 135–145)
Total Bilirubin: 0.2 mg/dL — ABNORMAL LOW (ref 0.3–1.2)
Total Protein: 6.7 g/dL (ref 6.5–8.1)

## 2021-01-26 LAB — TROPONIN I (HIGH SENSITIVITY)
Troponin I (High Sensitivity): 7 ng/L (ref <18)
Troponin I (High Sensitivity): 7 ng/L (ref <18)

## 2021-01-26 LAB — RESP PANEL BY RT-PCR (FLU A&B, COVID) ARPGX2
Influenza A by PCR: NEGATIVE
Influenza B by PCR: NEGATIVE
SARS Coronavirus 2 by RT PCR: NEGATIVE

## 2021-01-26 LAB — BRAIN NATRIURETIC PEPTIDE: B Natriuretic Peptide: 246.3 pg/mL — ABNORMAL HIGH (ref 0.0–100.0)

## 2021-01-26 LAB — D-DIMER, QUANTITATIVE: D-Dimer, Quant: 1.01 ug/mL-FEU — ABNORMAL HIGH (ref 0.00–0.50)

## 2021-01-26 LAB — SAVE SMEAR(SSMR), FOR PROVIDER SLIDE REVIEW

## 2021-01-26 MED ORDER — IPRATROPIUM-ALBUTEROL 0.5-2.5 (3) MG/3ML IN SOLN
3.0000 mL | Freq: Once | RESPIRATORY_TRACT | Status: AC
  Administered 2021-01-26: 3 mL via RESPIRATORY_TRACT
  Filled 2021-01-26: qty 3

## 2021-01-26 MED ORDER — FUROSEMIDE 10 MG/ML IJ SOLN
60.0000 mg | Freq: Once | INTRAMUSCULAR | Status: AC
  Administered 2021-01-26: 60 mg via INTRAVENOUS
  Filled 2021-01-26: qty 6

## 2021-01-26 MED ORDER — IPRATROPIUM-ALBUTEROL 0.5-2.5 (3) MG/3ML IN SOLN
RESPIRATORY_TRACT | Status: AC
  Administered 2021-01-26: 3 mL via RESPIRATORY_TRACT
  Filled 2021-01-26: qty 3

## 2021-01-26 MED ORDER — ALBUTEROL SULFATE (2.5 MG/3ML) 0.083% IN NEBU
INHALATION_SOLUTION | RESPIRATORY_TRACT | Status: AC
  Administered 2021-01-26: 2.5 mg via RESPIRATORY_TRACT
  Filled 2021-01-26: qty 3

## 2021-01-26 MED ORDER — IPRATROPIUM-ALBUTEROL 0.5-2.5 (3) MG/3ML IN SOLN: 3.0000 mL | RESPIRATORY_TRACT | 0 refills | Status: DC | PRN

## 2021-01-26 MED ORDER — IOHEXOL 350 MG/ML SOLN
100.0000 mL | Freq: Once | INTRAVENOUS | Status: AC | PRN
  Administered 2021-01-26: 100 mL via INTRAVENOUS

## 2021-01-26 MED ORDER — ALBUTEROL SULFATE (2.5 MG/3ML) 0.083% IN NEBU: 2.5000 mg | INHALATION_SOLUTION | Freq: Once | RESPIRATORY_TRACT | Status: AC

## 2021-01-26 MED ORDER — PREDNISONE 50 MG PO TABS
50.0000 mg | ORAL_TABLET | Freq: Every day | ORAL | 0 refills | Status: AC
Start: 2021-01-26 — End: 2021-01-31

## 2021-01-26 MED ORDER — METHYLPREDNISOLONE SODIUM SUCC 125 MG IJ SOLR
125.0000 mg | Freq: Once | INTRAMUSCULAR | Status: AC
  Administered 2021-01-26: 125 mg via INTRAVENOUS
  Filled 2021-01-26: qty 2

## 2021-01-26 MED ORDER — IPRATROPIUM-ALBUTEROL 0.5-2.5 (3) MG/3ML IN SOLN: 3.0000 mL | Freq: Once | RESPIRATORY_TRACT | Status: AC

## 2021-01-26 MED ORDER — MAGNESIUM SULFATE 2 GM/50ML IV SOLN
2.0000 g | Freq: Once | INTRAVENOUS | Status: AC
  Administered 2021-01-26: 2 g via INTRAVENOUS
  Filled 2021-01-26: qty 50

## 2021-01-26 NOTE — ED Notes (Signed)
Waiting for magnesium to finish to give Lasix.  Infusion interrupted due to CT scan

## 2021-01-26 NOTE — ED Triage Notes (Signed)
Pt was in cancer treatment center for follow-pt O2 dependent- was brought to facility without O2-O2 dat was 94 on RA and was started on O2 3LNC-pt also states she fell in the [place] in cancer treatment-hx of recent falls-to triage in w/c-report from Ginger, RN-RT in triage for assessment-pt with labored breathing/audible wheezing-taken to tx room

## 2021-01-26 NOTE — ED Notes (Signed)
Troponin is due at 6pm

## 2021-01-26 NOTE — ED Notes (Signed)
Returned from [facility] at 1600

## 2021-01-26 NOTE — ED Provider Notes (Signed)
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 mg  Nebulization Given 01/26/21 1441)  methylPREDNISolone sodium succinate (SOLU-MEDROL) 125 mg/2 mL injection 125 mg (125 mg Intravenous Given 01/26/21 1605)  magnesium sulfate IVPB 2 g 50 mL (2 g Intravenous New Bag/Given 01/26/21 1657)  ipratropium-albuterol (DUONEB) 0.5-2.5 (3) MG/3ML nebulizer solution 3 mL (3 mLs Nebulization Given 01/26/21 1649)  iohexol (OMNIPAQUE) 350 MG/ML injection 100 mL (100 mLs Intravenous Contrast Given 01/26/21 1727)    ED Course  I have reviewed the triage vital signs and the nursing notes.  Pertinent labs & imaging results that were available during my care of the patient were reviewed by me and considered in my medical decision making (see chart for details).  Here for evaluation of shortness of breath and chest tightness.  She is afebrile however does appear chronically ill.  Does not appear septic.  Known  history of lung cancer, chronic hypoxic respiratory failure.  She has diffuse expiratory wheeze and rhonchi on exam.  Speaks in short sentences.  Did have a fall without syncope while at cancer treatment earlier today.  She denies hitting her head, LOC.  She has pain to her lower back and left hip.  She is neurovascularly intact.  Seen by respiratory in triage room.  Start with labs, imaging, DuoNeb, steroids  Labs and imaging personally reviewed and interpreted:  COVID, flu negative CBC without leukocytosis, metabolic panel with glucose 122 D-dimer 1.01 BNP 246 Trop 7>>7 Cta without infection, PE, edema  Xrays without significant abnormality  1615: Patient reassessed. Feels better however still with significant wheeze. Added additional Duoneb and mag  1915: Patient reassessed.  Still has moderate wheeze however no respiratory distress.  I recommend admission to hospital to patient and husband.  They are adamant they do not want admission to the hospital at this time.  Discussed risk versus benefit of hospitalization versus leaving the emergency department.  They both voiced understanding and continue to decline hospitalization or further treatment at this time. They both state breathing is at baseline despite her persistent wheeze.  Will DC home on short course of steroids.  Was given Lasix here in ED due to mildly elevated BNP.  Discussed close follow-up.  Return if they change her mind for admission, breathing worsens. Ambulatory here without difficulty.  The patient has been appropriately medically screened and/or stabilized in the ED. I have low suspicion for any other emergent medical condition which would require further screening, evaluation or treatment in the ED or require inpatient management.  Patient is hemodynamically stable and in no acute distress.  Patient able to ambulate in department prior to ED.  Evaluation does not show acute pathology that would require ongoing or additional  emergent interventions while in the emergency department or further inpatient treatment.  I have discussed the diagnosis with the patient and answered all questions.  Pain is been managed while in the emergency department and patient has no further complaints prior to discharge.  Patient is comfortable with plan discussed in room and is stable for discharge at this time.  I have discussed strict return precautions for returning to the emergency department.  Patient was encouraged to follow-up with PCP/specialist refer to at discharge.      MDM Rules/Calculators/A&P                           Kaylee Hansen was evaluated in Emergency Department on 01/26/2021 for the symptoms described in the history of present illness. She was evaluated in the context  of the global COVID-19 pandemic, which necessitated consideration that the patient might be at risk for infection with the SARS-CoV-2 virus that causes COVID-19. Institutional protocols and algorithms that pertain to the evaluation of patients at risk for COVID-19 are in a state of rapid change based on information released by regulatory bodies including the CDC and federal and state organizations. These policies and algorithms were followed during the patient's care in the ED.  Final Clinical Impression(s) / ED Diagnoses Final diagnoses:  COPD exacerbation ([place])  Fall, initial encounter    Rx / DC Orders ED Discharge Orders          Ordered    predniSONE (DELTASONE) 50 MG tablet  Daily        01/26/21 1916    ipratropium-albuterol (DUONEB) 0.5-2.5 (3) MG/3ML SOLN  Every 4 hours PRN        01/26/21 1916             Ivo Moga A, PA-C 01/26/21 1923    Lucrezia Starch, MD 01/29/21 2158

## 2021-01-26 NOTE — Progress Notes (Signed)
Hematology and Oncology Follow Up Visit  Kaylee Hansen [id number] 26-Dec-1955 65 y.o. 01/26/2021.     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  As always, she comes in a wheelchair.  Her husband comes with her.  Unfortunately, looks like she is having a COPD exacerbation.  She is wheezing quite a bit.  She is uncomfortable.  She has some inhalers that she is using.  However, she is still quite tight.  Her oxygen saturation was okay.  Again, I really think that she is going had to go to the emergency room so they give her some nebulizers and some steroids down there.  Of note, we did do a PET scan on her back in May.  The PET scan did not show anything that looked suspicious for recurrent cancer.  Unfortunately, her hemoglobin keeps dropping.  Her white cell count is 5.9.  Hemoglobin 9.3.  Platelet count 265,000.  Her iron studies which we actually did back in March showed a ferritin of 34 with an iron saturation of 19%.  She did get a dose of IV iron.  We are checking her iron studies again today.  I am checking an erythropoietin level on her.  Currently, I would say her performance status is ECOG 3.    Medications:  Current Outpatient Medications:    albuterol (PROVENTIL) (2.5 MG/3ML) 0.083% nebulizer solution, INHALE 1 VIAL BY NEBULIZATION EVERY 4 HOURS AS NEEDED FOR WHEEZING OR SHORTNESS OF BREATH., Disp: 150 mL, Rfl: 5   ALPRAZolam (XANAX) 0.25 MG tablet, Take 1 tablet (0.25 mg total) by mouth at bedtime as needed for anxiety., Disp: 90 tablet, Rfl: 1   bisacodyl (DULCOLAX) 5 MG EC tablet, Take 2 tablets (10 mg total) by mouth at bedtime. (Patient taking differently: Take 10 mg by mouth daily as needed.), Disp: 30 tablet, Rfl:    clopidogrel (PLAVIX) 75 MG tablet, TAKE 1 TABLET (75 MG TOTAL) BY MOUTH DAILY  WITH BREAKFAST., Disp: 90 tablet, Rfl: 3   [employer] SENNA PLUS 8.6-50 MG tablet, Take 2 tablets by mouth at bedtime., Disp: , Rfl:    diltiazem (CARDIZEM CD) 360 MG 24 hr capsule, Take 1 capsule (360 mg total) by mouth daily., Disp: 90 capsule, Rfl: 3   diltiazem (TIAZAC) 360 MG 24 hr capsule, diltiazem CD 360 mg capsule,extended release 24 hr  TAKE 1 CAPSULE BY MOUTH EVERY DAY, Disp: , Rfl:    diphenhydrAMINE HCl, Sleep, (ZZZQUIL) 25 MG CAPS, Take 50 mg by mouth at bedtime as needed (sleep)., Disp: , Rfl:    furosemide (LASIX) 20 MG tablet, Take 1 tablet (20 mg total) by mouth daily., Disp: 90 tablet, Rfl: 3   HYDROcodone-acetaminophen (NORCO) 10-325 MG tablet, Take 1 tablet by mouth every 6 (six) hours as needed for severe pain. Please fill on or after 02/14/21, Disp: 120 tablet, Rfl: 0   isosorbide mononitrate (IMDUR) 120 MG 24 hr tablet, Take 120 mg by mouth daily., Disp: , Rfl:    ketoconazole (NIZORAL) 2 % cream, APPLY TO AFFECTED AREA EVERY DAY, Disp: 45 g, Rfl: 1   lactulose (CHRONULAC) 10 GM/15ML solution, lactulose 10 gram/15 mL oral solution  TAKE 30 MLS (20 G TOTAL) BY MOUTH DAILY., Disp: , Rfl:    levothyroxine (SYNTHROID) 150 MCG tablet, TAKE 1 TABLET BY MOUTH EVERY DAY, Disp:  90 tablet, Rfl: 3   nitroGLYCERIN (NITROSTAT) 0.4 MG SL tablet, DISSOLVE ONE TABLET UNDER THE TONGUE EVERY 5 MINUTES AS NEEDED FOR CHEST PAIN.  DO NOT EXCEED A TOTAL OF 3 DOSES IN 15 MINUTES (Patient taking differently: DISSOLVE ONE TABLET UNDER THE TONGUE EVERY 5 MINUTES AS NEEDED FOR CHEST PAIN.  DO NOT EXCEED A TOTAL OF 3 DOSES IN 15 MINUTES), Disp: 25 tablet, Rfl: 6   OXYGEN, Inhale 2.5 L/min into the lungs at bedtime., Disp: , Rfl:    pantoprazole (PROTONIX) 40 MG tablet, Take 1 tablet (40 mg total) by mouth daily before breakfast., Disp: 90 tablet, Rfl: 3   PARoxetine (PAXIL) 40 MG tablet, TAKE 1 TABLET BY MOUTH EVERY DAY, Disp: 90 tablet, Rfl: 3   phenytoin (DILANTIN) 100 MG ER capsule, TAKE 100 MG EVERY MORNING  AND 200 MG EVERY NIGHT., Disp: 270 capsule, Rfl: 3   Respiratory Therapy Supplies (FLUTTER) DEVI, As directed, Disp: 1 each, Rfl: 0   traZODone (DESYREL) 100 MG tablet, TAKE 1 TABLET BY MOUTH EVERYDAY AT BEDTIME, Disp: 90 tablet, Rfl: 3   Vitamin D, Ergocalciferol, (DRISDOL) 1.25 MG (50000 UNIT) CAPS capsule, TAKE 1 CAPSULE (50,000 UNITS TOTAL) BY MOUTH EVERY MONDAY., Disp: 12 capsule, Rfl: 3   albuterol (PROVENTIL HFA;VENTOLIN HFA) 108 (90 Base) MCG/ACT inhaler, Inhale 2 puffs into the lungs every 6 (six) hours as needed for wheezing or shortness of breath., Disp: 1 Inhaler, Rfl: 5   clobetasol (TEMOVATE) 0.05 % external solution, Apply 1 application topically 2 (two) times daily. For itchy scalp lesions (Patient not taking: Reported on 01/26/2021), Disp: 50 mL, Rfl: 3   guaiFENesin (MUCINEX) 600 MG 12 hr tablet, Take 1 tablet (600 mg total) by mouth 2 (two) times daily as needed for cough or to loosen phlegm. (Patient not taking: Reported on 01/26/2021), Disp: 14 tablet, Rfl: 0   HYDROcodone-acetaminophen (NORCO) 10-325 MG tablet, Take 1 tablet by mouth every 6 (six) hours as needed for up to 5 days for severe pain., Disp: 120 tablet, Rfl: 0   hydroxypropyl methylcellulose / hypromellose (ISOPTO TEARS / GONIOVISC) 2.5 % ophthalmic solution, Place 1 drop into both eyes 3 (three) times daily as needed for dry eyes. (Patient not taking: Reported on 01/26/2021), Disp: , Rfl:    isosorbide mononitrate (IMDUR) 60 MG 24 hr tablet, Take 1 tablet (60 mg total) by mouth daily., Disp: 90 tablet, Rfl: 3   naloxegol oxalate (MOVANTIK) 12.5 MG TABS tablet, Take 1 tablet (12.5 mg total) by mouth daily. (Patient not taking: Reported on 01/26/2021), Disp: 30 tablet, Rfl: 0   promethazine (PHENERGAN) 25 MG tablet, Take 1 tablet (25 mg total) by mouth every 8 (eight) hours as needed for nausea or vomiting. (Patient not taking: Reported on 01/26/2021), Disp: 30 tablet, Rfl: 0   rosuvastatin (CRESTOR) 20 MG tablet, Take 1 tablet (20  mg total) by mouth daily., Disp: 90 tablet, Rfl: 3   SUMAtriptan (IMITREX) 100 MG tablet, Take 1 tablet (100 mg total) by mouth once for 1 dose. May repeat in 2 hours if headache persists or recurs., Disp: 9 tablet, Rfl: 3  Allergies:  Allergies  Allergen Reactions   Nitrofuran Derivatives Other (See Comments)    confusion   Oxycodone Hcl Other (See Comments)    Knocked her out for 6 days   Morphine Other (See Comments)    "Makes me go crazy."    Penicillins Hives    Has patient had a PCN reaction causing immediate rash, facial/tongue/throat swelling,  SOB or lightheadedness with hypotension: unknown Has patient had a PCN reaction causing severe rash involving mucus membranes or skin necrosis: unknown Has patient had a PCN reaction that required hospitalization No Has patient had a PCN reaction occurring within the last 10 years: No If all of the above answers are "NO", then may proceed with Cephalosporin use.  Other reaction(s): Rash-Generalized   Tape Other (See Comments)    Skin turns red and burns    Past Medical History, Surgical history, Social history, and Family History were reviewed and updated.  Review of Systems: Review of Systems  Constitutional:  Positive for fatigue.  HENT:  Negative.    Eyes: Negative.   Respiratory:  Positive for cough.   Cardiovascular:  Positive for chest pain.  Gastrointestinal: Negative.   Endocrine: Negative.   Genitourinary: Negative.    Musculoskeletal:  Positive for arthralgias and myalgias.  Skin: Negative.   Neurological: Negative.   Hematological: Negative.   Psychiatric/Behavioral: Negative.     Physical Exam:  weight is 164 lb 6.4 oz (74.6 kg). Her oral temperature is 98.7 F (37.1 C). Her blood pressure is 136/40 (abnormal) and her pulse is 81. Her respiration is 28 (abnormal) and oxygen saturation is 94%.   Wt Readings from Last 3 Encounters:  01/26/21 164 lb 6.4 oz (74.6 kg)  12/02/20 160 lb 3.2 oz (72.7 kg)  10/05/20  163 lb (73.9 kg)    Physical Exam Vitals reviewed.  HENT:     Head: Normocephalic and atraumatic.  Eyes:     Pupils: Pupils are equal, round, and reactive to light.  Cardiovascular:     Rate and Rhythm: Normal rate and regular rhythm.     Heart sounds: Normal heart sounds.     Comments: Cardiac exam shows a regular rate and rhythm.  She has no murmurs, rubs or bruits. Pulmonary:     Effort: Pulmonary effort is normal.     Breath sounds: Normal breath sounds.     Comments: Pulmonary exam shows decreased air movement throughout both lung fields.  She has a lot of wheezing.  She has a lot of expiratory wheezing.  Her expiratory phase of breathing is quite long.  She has rhonchi bilaterally.  I do not hear any stridor.   Abdominal:     General: Bowel sounds are normal.     Palpations: Abdomen is soft.  Musculoskeletal:        General: No tenderness or deformity. Normal range of motion.     Cervical back: Normal range of motion.  Lymphadenopathy:     Cervical: No cervical adenopathy.  Skin:    General: Skin is warm and dry.     Findings: No erythema or rash.     Comments: Skin exam shows scattered ecchymoses.  Neurological:     Mental Status: She is alert and oriented to person, place, and time.  Psychiatric:        Behavior: Behavior normal.        Thought Content: Thought content normal.        Judgment: Judgment normal.     Lab Results  Component Value Date   WBC 5.9 01/26/2021   HGB 9.3 (L) 01/26/2021   HCT 30.1 (L) 01/26/2021   MCV 97.7 01/26/2021   PLT 265 01/26/2021     Chemistry      Component Value Date/Time   NA 140 01/26/2021 1320   NA 144 07/08/2018 1059   K 4.5 01/26/2021 1320   CL 102  01/26/2021 1320   CO2 32 01/26/2021 1320   BUN 14 01/26/2021 1320   BUN 12 07/08/2018 1059   CREATININE 1.08 (H) 01/26/2021 1320      Component Value Date/Time   CALCIUM 9.2 01/26/2021 1320   ALKPHOS 115 01/26/2021 1320   AST 15 01/26/2021 1320   ALT 12 01/26/2021  1320   BILITOT 0.2 (L) 01/26/2021 1320       Impression and Plan: Ms. Mcglocklin is a 65 year old white female.  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  For now, we we need to focus on her breathing.  Again I think she is having a COPD exacerbation.  We will get her down to the emergency room.  They we will take her.  We called down there.  As always, they do a fantastic job.  I am sure they will give her some nebulizers and some steroids.  We will have to see what her iron studies look like.  We will have to see what her erythropoietin level is.  I would like to get her back in about 4 or 5 weeks.  I am little more worried about her anemia right now.  Far as her lung cancer is concerned, I will think we have to do anything with respect to scans probably until next year.    Hopefully, she will not end up in the hospital.    [name], MD 8/3/20222:30 PM

## 2021-01-26 NOTE — Progress Notes (Signed)
2:48 PM At approximately 1400, patient went to bathroom via WC accompanied by husband. Staff was not notified of need to go to the BR. Subsequently fell resulting in skin tear on left elbow and left hip pain. Dr. Marin Olp notified staff of the fall, dressing applied to left elbow.  Pt wheezing, oxygen at 3 LPM started.  Oxygen Sat 98%. Dr Marin Olp ordered transfer to ED.  Transported to ED via [facility], report given to triage nurse. Dr. Marin Olp also called to speak with ED MD.

## 2021-01-26 NOTE — Discharge Instructions (Addendum)
Use inhalers every 2 hours.  Take the steroids as prescribed.  Continue using your home oxygen.  If you change your mind and you want admission to the hospital and year breathing worsens please seek reevaluation

## 2021-01-26 NOTE — ED Notes (Signed)
Patient and husband insists the she will fine to go home on room air.  Her husband states they go out frequently without oxygen.

## 2021-01-26 NOTE — ED Notes (Signed)
Pt has poor IV access, attempted x 1 and was unsucessful.  Several xrays ordered for pt.

## 2021-01-26 NOTE — ED Notes (Signed)
States he feels anxious

## 2021-01-27 LAB — ERYTHROPOIETIN: Erythropoietin: 46 m[IU]/mL — ABNORMAL HIGH (ref 2.6–18.5)

## 2021-01-27 LAB — IRON AND TIBC
Iron: 31 ug/dL — ABNORMAL LOW (ref 41–142)
Saturation Ratios: 11 % — ABNORMAL LOW (ref 21–57)
TIBC: 285 ug/dL (ref 236–444)
UIBC: 254 ug/dL (ref 120–384)

## 2021-01-27 LAB — FERRITIN: Ferritin: 22 ng/mL (ref 11–307)

## 2021-01-28 ENCOUNTER — Telehealth: Payer: Self-pay

## 2021-01-28 NOTE — Telephone Encounter (Signed)
Husband advised of lab results (ok per DPR). Husband stated understanding. Message sent to scheduling to call and schedule apt.

## 2021-01-28 NOTE — Telephone Encounter (Signed)
-----   Message from [name], MD sent at 01/27/2021  3:06 PM EDT ----- Please call her and tell her that the iron is low.  She really needs to have IV iron given please set this up.  Kaylee Hansen

## 2021-02-02 ENCOUNTER — Inpatient Hospital Stay: Payer: Medicare HMO

## 2021-02-02 ENCOUNTER — Other Ambulatory Visit: Payer: Self-pay

## 2021-02-02 VITALS — BP 123/37 | HR 86 | Temp 99.9°F

## 2021-02-02 DIAGNOSIS — D500 Iron deficiency anemia secondary to blood loss (chronic): Secondary | ICD-10-CM

## 2021-02-02 DIAGNOSIS — D649 Anemia, unspecified: Secondary | ICD-10-CM | POA: Diagnosis not present

## 2021-02-02 MED ORDER — SODIUM CHLORIDE 0.9 % IV SOLN
Freq: Once | INTRAVENOUS | Status: AC
  Filled 2021-02-02: qty 250

## 2021-02-02 MED ORDER — SODIUM CHLORIDE 0.9 % IV SOLN
200.0000 mg | Freq: Once | INTRAVENOUS | Status: AC
  Administered 2021-02-02: 200 mg via INTRAVENOUS
  Filled 2021-02-02: qty 200

## 2021-02-03 ENCOUNTER — Telehealth: Payer: Self-pay | Admitting: Internal Medicine

## 2021-02-03 NOTE — Chronic Care Management (AMB) (Signed)
  Chronic Care Management   Note  02/03/2021 Name: AZAIAH MELLO MRN: [id number] DOB: 07/22/1955  [name] is a 65 y.o. year old female who is a primary care patient of Plotnikov, [name], MD. I reached out to [employer] by phone today in response to a referral sent by Ms. Aleera L Rude's PCP, Plotnikov, [name], MD.   Ms. Ertl was given information about Chronic Care Management services today including:  CCM service includes personalized support from designated clinical staff supervised by her physician, including individualized plan of care and coordination with other care providers 24/7 contact phone numbers for assistance for urgent and routine care needs. Service will only be billed when office clinical staff spend 20 minutes or more in a month to coordinate care. Only one practitioner may furnish and bill the service in a calendar month. The patient may stop CCM services at any time (effective at the end of the month) by phone call to the office staff.   Patient agreed to services and verbal consent obtained.   Follow up plan:   Tatjana Secretary/administrator

## 2021-02-04 ENCOUNTER — Telehealth: Payer: Self-pay | Admitting: Cardiology

## 2021-02-04 NOTE — Telephone Encounter (Signed)
Spoke to patient. Patient states she is having chest pain. She states she took  NTG sublingual @ 4 hours ago.   Patient states  she took her Imdur 120 mg today.   She is states she is not wearing oxygen for last 15- 20 min. RN informed patient to put  oxygen on.  RN instructed patient if he having active chest pain. To call 911  and go to ER. Patient states she want go and husband want take her. RN  ask if she would like for Rn to send EMS to her home.patient states no.  RN again informed patient she needs go to ER for  evaluation. Again patient stated no .  RN recommend patient to take another Nitroglycerin and offered her an appointment . She states she wants to see ho w things go and will call back next week

## 2021-02-04 NOTE — Telephone Encounter (Signed)
Pt c/o of Chest Pain: STAT if CP now or developed within 24 hours  1. Are you having CP right now?  Yes  2. Are you experiencing any other symptoms (ex. SOB, nausea, vomiting, sweating)?  No   3. How long have you been experiencing CP?  Patient states chest pain developed about 8 hours ago--has been on and off ever since.  4. Is your CP continuous or coming and going?  Coming and going   5. Have you taken Nitroglycerin?  Patient states she took 2 nitro  ?

## 2021-02-10 ENCOUNTER — Ambulatory Visit: Payer: Medicare HMO

## 2021-02-10 ENCOUNTER — Other Ambulatory Visit: Payer: Self-pay

## 2021-02-10 DIAGNOSIS — G8929 Other chronic pain: Secondary | ICD-10-CM

## 2021-02-10 DIAGNOSIS — G40909 Epilepsy, unspecified, not intractable, without status epilepticus: Secondary | ICD-10-CM

## 2021-02-10 DIAGNOSIS — I10 Essential (primary) hypertension: Secondary | ICD-10-CM

## 2021-02-10 DIAGNOSIS — G4700 Insomnia, unspecified: Secondary | ICD-10-CM

## 2021-02-10 DIAGNOSIS — J449 Chronic obstructive pulmonary disease, unspecified: Secondary | ICD-10-CM

## 2021-02-10 DIAGNOSIS — E785 Hyperlipidemia, unspecified: Secondary | ICD-10-CM

## 2021-02-10 DIAGNOSIS — E034 Atrophy of thyroid (acquired): Secondary | ICD-10-CM

## 2021-02-10 DIAGNOSIS — K5904 Chronic idiopathic constipation: Secondary | ICD-10-CM

## 2021-02-10 DIAGNOSIS — G43109 Migraine with aura, not intractable, without status migrainosus: Secondary | ICD-10-CM

## 2021-02-10 NOTE — Progress Notes (Addendum)
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 -Controlled -Current treatment: Alprazolam 0.25mg - 1 tablet 71medtime as needed  Paroxetine 40mg - 1 tablet d45m  Trazodone 100mg - 1 tablet at59mtime  Diphenhydramine 25mg - 2 tablet at 1mime  -Medications previously tried/failed: clorazepate, hydroxyzine,  -PHQ9: unable to be completed today, spoke with patient's husband Rex today at her request to review medications as he manages them  -GAD7: unable  to be completed today, spoke with patient's husband Rex today at her request to review medications as he manages them -Previously followed with psychiatrist - did not feel it was a benefit to her, does not wish to restart at this time  -Educated on Benefits of medication for symptom control Benefits of cognitive-behavioral therapy with or without medication -Recommended for patient to discontinue diphenhydramine, as she is  taking a number of sedating medications,  daytime fatigue may be reduced by stopping diphenhydramine - possible others in the future  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) -Controlled - no issues with seizure since starting phenytoin -Current treatment  Phenytoin ER 132m - 1055min the AM and 20028mn the PM Last Phenytoin level: 15.3mg49m(01/01/2019) -Medications previously tried: n/a  -Recommended to continue current medication  Chronic Pain - Back Pain and OA (Goal: Pain control) -Controlled -Current treatment  Hydrocodone-APAP 10-325mg24m tablet every 6 hours as needed for pain  -Medications previously tried: fentanyl patches,   -Educated on patient no to take alprazolam and hydrocodone together due to potential interaction / issues with breathing already, patient to take lowest amount of medications to control anxiety and pain, voiced understanding of risks associated with current medication combination    Vitamin D Deficiency (Goal: Maintenance of appropriate vitamin D levels) -Not ideally controlled - last vitamin d level from 2017 Last vitamin D Lab Results  Component Value Date   VD25OH 8.82 (L) 07/14/2015  Current Therapy: Vitamin D3 50,000 units - 1 capsule every Monday  -Medications previously tried: na/  -Recommended for updated vitamin D level to assess current level and adjust dosage if necessary   Chronic Migraine/ Headaches (Goal: Prevention/ control of migraines/headaches) -Controlled -Current treatment  Sumatriptan 100mg 35mtablet at onset of headache - can repeat dose x 1 Naproxen 220mg -73mablet daily as needed for headache or migraine -Medications previously tried: zolmitriptan  -Recommended to continue current medication  Health Maintenance -Vaccine gaps: Shingles, COVID booster, influenza, and pneumonia -Current therapy:  Ketoconazole 2% cream - applied daily  -Patient is satisfied with current therapy and denies issues -Recommended to  continue current medication  Patient Goals/Self-Care Activities Patient will:  - take medications as prescribed check blood pressure daily , document, and provide at future appointments engage in dietary modifications by trying to reduce sodium intake <2300mgdai36m Follow Up Plan: Telephone follow up appointment with care management team member scheduled for: The patient has been provided with contact information for the care management team and has been advised to call with any health related questions or concerns.     Medication Assistance: Application for [employer]  medication assistance program. in process.  Anticipated assistance start date 03/13/2021.  See plan of care for additional detail.   Patient's preferred pharmacy is:  [id number] [id number] [place] - [address] [phone] box? Yes Pt endorses 90-100% compliance  Care Plan and Follow Up Patient Decision:  Patient agrees to Care Plan and Follow-up.  Plan: Telephone follow up appointment with care management team member scheduled for:  2 months and The patient has been provided with contact information for the care management team and has been advised to call with any health related questions or concerns.   [employer] C Tomasa BlaseClinical Pharmacist, [place] [facility] examination/treatment/procedure(s) were performed by non-physician practitioner and as supervising physician I was  immediately available for consultation/collaboration.  I agree with above. Lew Dawes, MD

## 2021-02-10 NOTE — Patient Instructions (Signed)
Visit Information   PATIENT GOALS:   Goals Addressed             This Visit's Progress    Manage My Medicine       Timeframe:  Long-Range Goal Priority:  High Start Date:  02/10/2021                         Expected End Date: 08/13/2021                      Follow Up Date 04/18/2021   - call for medicine refill 2 or 3 days before it runs out - keep a list of all the medicines I take; vitamins and herbals too - learn to read medicine labels - use a pillbox to sort medicine    Why is this important?   These steps will help you keep on track with your medicines.   Notes: Patient/ Husband to reach out if there is a copay issue with the medication to help resolve the issue     Track and Manage My Blood Pressure-Hypertension       Timeframe:  Long-Range Goal Priority:  High Start Date:    02/10/2021                         Expected End Date:  08/13/2021                     Follow Up Date 04/19/2021   - check blood pressure daily - choose a place to take my blood pressure (home, clinic or office, retail store) - write blood pressure results in a log or diary    Why is this important?   You won't feel high blood pressure, but it can still hurt your blood vessels.  High blood pressure can cause heart or kidney problems. It can also cause a stroke.  Making lifestyle changes like losing a little weight or eating less salt will help.  Checking your blood pressure at home and at different times of the day can help to control blood pressure.  If the doctor prescribes medicine remember to take it the way the doctor ordered.  Call the office if you cannot afford the medicine or if there are questions about it.        Track and Manage My Symptoms-COPD   Not on track    Timeframe:  Long-Range Goal Priority:  Medium Start Date:          01/24/21                   Expected End Date:     01/24/22                  Follow Up Date 03/14/2021    Develop and follow a rescue plan if symptoms  flare up: please contact your care provider promptly if you believe your breathing is starting to get worse Eliminate symptom triggers at home: please do not smoke near your home Oxygen concentrator; please try to decrease and stop smoking- this is the most important thing you can do to help your breathing Keep all doctor and follow-up appointments- please make sure to communicate your healthcare needs and any problems you are having with your doctors and your care team Please try to limit the salt in your diet: eating salt can make you retain fluid, which can  make your breathing worse I have made Dr. Alain Marion aware that you have reported ongoing depression today, so he can decide if he would like to adjust or change your depression medication: please discuss this with Dr. Alain Marion when you attend your next office visit with him on March 10, 2021 Please listen out for a call from the Pharmacy team at Dr. Judeen Hammans office: they have been trying to contact you to schedule a review of your medications   Why is this important?   Tracking your symptoms and other information about your health helps your doctor plan your care.  Write down the symptoms, the time of day, what you were doing and what medicine you are taking.  You will soon learn how to manage your symptoms.             Consent to CCM Services: Ms. Shawley was given information about Chronic Care Management services including:  CCM service includes personalized support from designated clinical staff supervised by her physician, including individualized plan of care and coordination with other care providers 24/7 contact phone numbers for assistance for urgent and routine care needs. Service will only be billed when office clinical staff spend 20 minutes or more in a month to coordinate care. Only one practitioner may furnish and bill the service in a calendar month. The patient may stop CCM services at any time (effective at the  end of the month) by phone call to the office staff. The patient will be responsible for cost sharing (co-pay) of up to 20% of the service fee (after annual deductible is met).  Patient agreed to services and verbal consent obtained.   Patient verbalizes understanding of instructions provided today and agrees to view in [place].   Telephone follow up appointment with care management team member scheduled for: 2 months The patient has been provided with contact information for the care management team and has been advised to call with any health related questions or concerns.   Tomasa Blase, PharmD Clinical Pharmacist, [place]   CLINICAL CARE PLAN: Patient Care Plan: COPD (Adult)     Problem Identified: Symptom Exacerbation (COPD)   Priority: Medium     Long-Range Goal: Symptom Exacerbation Prevented or Minimized   Start Date: 01/24/2021  Expected End Date: 01/24/2022  This Visit's Progress: On track  Priority: Medium  Note:   Current Barriers:  Knowledge deficits related to basic COPD self care/management Does not adhere to provider recommendations re: smoking cessation- continues to smoke 8-10 cigarettes per day Unable to perform ADLs/ iADL's independently: supportive husband assists as indicated High fall risk: reports "weekly" falls despite use of assistive devices, including motorized wheelchair, walker Fragile state of health, multiple progressing chronic health conditions  Case Manager Clinical Goal(s): 01/24/21: Over the next 12 months, patient/ caregiver will verbalize understanding of COPD action plan and when to seek appropriate levels of medical care as evidenced by patient/ caregiver reporting during CCM RN CM outreach Interventions:  Collaboration with Plotnikov, [name], MD regarding development and update of comprehensive plan of care as evidenced by provider attestation and co-signature Inter-disciplinary care team collaboration (see longitudinal plan  of care) Chart reviewed including relevant office notes, upcoming scheduled appointments, and lab results Discussed current  clinical condition with patient/ caregiver- husband while phone on speaker mode and confirmed no current clinical concerns; confirmed caregiver manages medications for patient; patient and caregiver report good adherence to medication regimen and deny current medication concerns; encouraged patient to engage with  CCM Pharmacy team, they have reached out to patient for medication review: she is agreeable; encouraged her to make a list of any medication concerns she may have prior to scheduled call with CCM Pharmacy team Reviewed recent PCP office visit with patient/ caregiver: reports  good general understanding of post-office visit instructions: confirmed that she is taking antibiotics recently prescribed for presumed UTI; reports "unsure" if she is getting better Discussed management of chronic pain with patient, who reports chronic constipation/ abdominal pain: she reports that she takes narcotics regularly, and side of effect of constipation was discussed; she currently uses stool softeners and occasional enemas to manage chronic constipation due to narcotic use Basic overview and discussion of pathophysiology of COPD in setting of lung CA; patient/ caregiver verbalize good baseline understanding of around same but could benefit from ongoing reinforcement-- patient reports she continues to smoke 8-10 cigarettes/ per day: smoking cessation was again encouraged, patient does not appear interested in smoking cessation Confirmed patient using nebulizer daily and rescue inhaler weekly: encouraged her to discuss her normal use with pharmacist at time of CCM pharmacy team appointment Assessed need for readable accurate scales in home: confirmed patient has scales and is monitoring weights daily but does not understand why: rationale provided Provided verbal education around signs/ symptoms  yellow COPD zone along with corresponding action plan:  patient/ caregiver will require ongoing reinforcement around same; confirmed that patient reports she does not smoke near her home oxygen concentrator- currently using home O2 via [place] 2.5- 3 L/min qHS and prn throughout daytime hours Provided patient with verbal education around COPD action plan and reinforced importance of daily self assessment and prompt notification of care providers when flares/ problems arise Confirmed patient endorses following regular diet without no restrictions: advantages of following low salt, heart healthy diet in COPD management discussed with patient; verbal education initiated around strategies to decrease salt intake- will provide printed/ written education as well Reviewed upcoming provider appointments with patient/ caregiver and confirmed that patient has plans to attend all as scheduled: 01/26/21: oncology provider office visit; 03/22/21: cardiology provider office visit- patient verbalizes plans to attend all as scheduled Self-Care Activities:  Patient verbalizes understanding of plan to discuss ongoing depression with PCP; engage with [facility] team for medication review Attends all scheduled provider appointments Calls provider office for new concerns or questions Patient Goals: Develop and follow a rescue plan if symptoms flare up: please contact your care provider promptly if you believe your breathing is starting to get worse Eliminate symptom triggers at home: please do not smoke near your home Oxygen concentrator; please try to decrease and stop smoking- this is the most important thing you can do to help your breathing Keep all doctor and follow-up appointments- please make sure to communicate your healthcare needs and any problems you are having with your doctors and your care team Please try to limit the salt in your diet: eating salt can make you retain fluid, which can make your breathing worse I  have made Dr. Alain Marion aware that you have reported ongoing depression today, so he can decide if he would like to adjust or change your depression medication: please discuss this with Dr. Alain Marion when you attend your next office visit with him on March 10, 2021 Please listen out for a call from the Pharmacy team at Dr. Judeen Hammans office: they have been trying to contact you to schedule a review of your medications Follow Up Plan:  Telephone follow up appointment with care  management team member scheduled for:  Monday March 14, 2021 at 11:30 am The patient has been provided with contact information for the care management team and has been advised to call with any health related questions or concerns      Patient Care Plan: CCM Care Plan     Problem Identified: HTN, HLD, Previous NSTEMI, COPD, Vitamin D Deficiency, hypothyroidism, chronic pain, migraines, chronic constipation, depression, anxiety, insomnia   Priority: High  Onset Date: 02/10/2021     Long-Range Goal: Disease Management   Start Date: 02/10/2021  Expected End Date: 08/13/2021  This Visit's Progress: On track  Priority: High  Note:   Current Barriers:  Unable to independently afford treatment regimen Unable to independently monitor therapeutic efficacy  Pharmacist Clinical Goal(s):  Patient will verbalize ability to afford treatment regimen achieve adherence to monitoring guidelines and medication adherence to achieve therapeutic efficacy through collaboration with PharmD and provider.   Interventions: 1:1 collaboration with Plotnikov, [name], MD regarding development and update of comprehensive plan of care as evidenced by provider attestation and co-signature Inter-disciplinary care team collaboration (see longitudinal plan of care) Comprehensive medication review performed; medication list updated in electronic medical record  Hypertension (BP goal <130/80) -Controlled -Current treatment: Diltiazem  371m - 1 capsule daily  Isosorbide Mononitrate 1217m- 1 tablet daily  Furosemide 2082m 1 tablet daily  Last potassium level 4.3mm44mL (01/26/2021) -Medications previously tried: metoprolol tartrate  -Current home readings: typically averaging 125-135/80 has not been checking heart rate  -Current dietary habits: reports that she salts her food, does not try to reduce sodium  -Current exercise habits: limited - patient ambulates in wheelchair  -Denies hypotensive/hypertensive symptoms -Educated on BP goals and benefits of medications for prevention of heart attack, stroke and kidney damage; Daily salt intake goal < 2300 mg; Importance of home blood pressure monitoring; Proper BP monitoring technique; Symptoms of hypotension and importance of maintaining adequate hydration; -Counseled to monitor BP at home daily, document, and provide log at future appointments -Counseled on diet and exercise extensively Recommended to continue current medication  Hyperlipidemia/ History of NSTEMI: (LDL goal < 70) -Controlled -Last LDL Level: 56 mg/dL (06/29/2018) -Current treatment: Rosuvastatin 20mg80m tablet daly  Clopidogrel 75mg 48mtablet daily  Nitroglycerin 0.4mg SL3mblets-  1 tablet every 5 minutes as needed  -Medications previously tried: n/a  -Current dietary patterns: patient can be a very particular eater, limits fried foods, but will does not always watch intake of foods high in cholesterol -Current exercise habits: n/a - as patient is wheelchair bound  -Educated on Cholesterol goals;  Benefits of statin for ASCVD risk reduction; Importance of limiting foods high in cholesterol; -Counseled on diet and exercise extensively Recommended to continue current medication  COPD (Goal: control symptoms and prevent exacerbations) -Not ideally controlled -Current treatment  Albuterol HFA 108mcg/a63m 2 puffs every 6 hours as needed - does not use very often, uses when she is out of the house   Ipratropium -Albuterol 0.5-2.5/3mL nebu3mer solution - 1 vial every 4 hours as needed  Albuterol 0.083% nebulizer solution - 1 vial every 4 hours as needed - not currently using as she has received duonebs Guaifenesin 100mg/5mL 13m00mg twice34mly if needed  Oxygen - 2.5L continuous -Medications previously tried: spiriva, anoro, symbicort,  -Gold Grade: Gold 4 (FEV1<40%) -Current COPD Classification:   -Pulmonary function testing: 05/02/2017 FEV1 0.96 (37%) -Exacerbations requiring treatment in last 6 months: 2 -Patient denies consistent use of maintenance  inhaler -Frequency of rescue inhaler use: 1-2 times daily  -Counseled on Proper inhaler technique; Benefits of consistent maintenance inhaler use When to use rescue inhaler Differences between maintenance and rescue inhalers -Recommended for patient to trial breo 100-39mg - 1 puff daily (concern that addition of LAMA could further exacerbate constipation issues)  Hypothyroidism (Goal: Maintenance of euthyroid levels) -Controlled Lab Results  Component Value Date   TSH 1.84 01/01/2019  -Current treatment  Levothyroxine 1550m - 1 tablet daily  -Medications previously tried: n/a  -Recommended to continue current medication  GERD (Goal: Prevention of acid reflux / control of symptoms) -Controlled -Current treatment  Pantoprazole 402m 1 tablet daily  Calcium Carbonate 500m14m1 tablet daily as needed  -Medications previously tried: esomeprazole  -Counseled on diet and exercise extensively Recommended to continue current medication  Chronic Constipation (OIC) (Goal: Promotion of regular bowel movements) -Not ideally controlled -Current treatment  Movtantik 12.5mg 68m tablet daily - not currently taking  Bisacodyl 5mg -78mtablet daily as needed  Docusate 100mg -40mapsule daily as needed  Lactulose 10g/15mL - 66ms da37m- not currently taking  -Medications previously tried: linzess,  miralax  -Counseled on diet and  exercise extensively Recommended for patient to restart a daily medication to help keep bowel movements regular, previously had taken linzess but could not continue due to cost, will apply for linzess PAP  Depression/Anxiety/ Insomnia(Goal: promotion of mood / prevention of anxiety) -Controlled -Current treatment: Alprazolam 0.25mg - 1 87met at bedtime as needed  Paroxetine 40mg - 1 t37mt daily  Trazodone 100mg - 1 ta2m at bedtime  Diphenhydramine 25mg - 2 tab29mat bedtime  -Medications previously tried/failed: clorazepate, hydroxyzine,  -PHQ9: unable to be completed today, spoke with patient's husband Rex today at her request to review medications as he manages them  -GAD7: unable to be completed today, spoke with patient's husband Rex today at her request to review medications as he manages them -Previously followed with psychiatrist - did not feel it was a benefit to her, does not wish to restart at this time  -Educated on Benefits of medication for symptom control Benefits of cognitive-behavioral therapy with or without medication -Recommended for patient to discontinue diphenhydramine, as she is taking a number of sedating medications,  daytime fatigue may be reduced by stopping diphenhydramine - possible others in the future  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) -Controlled - no issues with seizure since starting phenytoin -Current treatment  Phenytoin ER 100mg - 100mg 41mhe AM56m 200mg in the PM 12m Phenytoin level: 15.3mg/L (01/01/2019)62medications previously tried: n/a  -Recommended to continue current medication  Chronic Pain - Back Pain and OA (Goal: Pain control) -Controlled -Current treatment  Hydrocodone-APAP 10-325mg - 1 tablet e60m 6 hours as needed for pain  -Medications previously tried: fentanyl patches,   -Educated on patient no to take alprazolam and hydrocodone together due to potential interaction / issues with breathing already, patient to  take lowest amount of medications to control anxiety and pain, voiced understanding of risks associated with current medication combination    Vitamin D Deficiency (Goal: Maintenance of appropriate vitamin D levels) -Not ideally controlled - last vitamin d level from 2017 Last vitamin D Lab Results  Component Value Date   VD25OH 8.82 (L) 07/14/2015  Current Therapy: Vitamin D3 50,000 units - 1 capsule every Monday  -Medications previously tried: na/  -Recommended for updated vitamin D level to assess current level and adjust dosage if  necessary   Chronic Migraine/ Headaches (Goal: Prevention/ control of migraines/headaches) -Controlled -Current treatment  Sumatriptan 14m - 1 tablet at onset of headache - can repeat dose x 1 Naproxen 2234m- 1 tablet daily as needed for headache or migraine -Medications previously tried: zolmitriptan  -Recommended to continue current medication  Health Maintenance -Vaccine gaps: Shingles, COVID booster, influenza, and pneumonia -Current therapy:  Ketoconazole 2% cream - applied daily  -Patient is satisfied with current therapy and denies issues -Recommended to continue current medication  Patient Goals/Self-Care Activities Patient will:  - take medications as prescribed check blood pressure daily , document, and provide at future appointments engage in dietary modifications by trying to reduce sodium intake <230072mily   Follow Up Plan: Telephone follow up appointment with care management team member scheduled for: The patient has been provided with contact information for the care management team and has been advised to call with any health related questions or concerns.

## 2021-02-16 DIAGNOSIS — C3490 Malignant neoplasm of unspecified part of unspecified bronchus or lung: Secondary | ICD-10-CM | POA: Diagnosis not present

## 2021-02-23 ENCOUNTER — Telehealth: Payer: Self-pay | Admitting: Cardiology

## 2021-02-23 DIAGNOSIS — J449 Chronic obstructive pulmonary disease, unspecified: Secondary | ICD-10-CM

## 2021-02-23 DIAGNOSIS — E785 Hyperlipidemia, unspecified: Secondary | ICD-10-CM | POA: Diagnosis not present

## 2021-02-23 DIAGNOSIS — E034 Atrophy of thyroid (acquired): Secondary | ICD-10-CM

## 2021-02-23 DIAGNOSIS — C3490 Malignant neoplasm of unspecified part of unspecified bronchus or lung: Secondary | ICD-10-CM | POA: Diagnosis not present

## 2021-02-23 DIAGNOSIS — I10 Essential (primary) hypertension: Secondary | ICD-10-CM

## 2021-02-23 NOTE — Telephone Encounter (Signed)
Pt c/o of Chest Pain: 1. Are you having CP right now? No  2. Are you experiencing any other symptoms (ex. SOB, nausea, vomiting, sweating)? SOB sweating  3. How long have you been experiencing CP? A few days  4. Is your CP continuous or coming and going? Coming and going 5. Have you taken Nitroglycerin? Yes     Pt c/o Shortness Of Breath: STAT if SOB developed within the last 24 hours or pt is noticeably SOB on the phone  1. Are you currently SOB (can you hear that pt is SOB on the phone)? yes patient is wheezing  2. How long have you been experiencing SOB? All the time 3. Are you SOB when sitting or when up moving around? both 4.  Are you currently experiencing any other symptoms? No

## 2021-02-23 NOTE — Telephone Encounter (Signed)
Left a message for the patient to call back.  

## 2021-02-23 NOTE — Telephone Encounter (Signed)
Pt is following up on her previous phone note this morning at 10:58am, she states that a nurse has yet to return her call.

## 2021-02-23 NOTE — Telephone Encounter (Signed)
Sounds reasonable --   She is very complicated - has lots of ongoing issues & Heart/CAD-Valve disease are only part of the problem.  Can talk to Dr. Audie Box about her prior to visit.  [name], MD

## 2021-02-23 NOTE — Telephone Encounter (Signed)
Received stat call from patient who was calling with complaints of shortness of breath and chest pain. Patient states that she is not currently having chest pain but states that she has been having the episodes of sharp chest pain more frequently with last episode last night. Patient is also audibly short of breath on the phone. Patient is requesting to make a sooner appointment with Dr. Ellyn Hack. Advised patient that due to the severity of her symptoms she should report to the ER to be seen. Patient states that she is absolutely not going to the ER to be seen. Advised patient that next available appointment with Dr. Ellyn Hack was not until 9/15, and that I did not advise she wait that long to be seen. Made patient an appointment with Dr. Audie Box (DOD) on 9/6 to be seen. Advised patient that if chest pain returns or symptoms worsen at all she should report to the ER and not wait for her appointment. Patient verbalized understanding.

## 2021-02-28 NOTE — Progress Notes (Signed)
Cardiology Office Note:   Date:  03/01/2021  NAME:  Kaylee Hansen    MRN: [id number] DOB:  1956-02-12   PCP:  Cassandria Anger, MD  Cardiologist:  [name], MD  Electrophysiologist:  None   Referring MD: Cassandria Anger, MD   Chief Complaint  Patient presents with   Chest Pain   History of Present Illness:   Kaylee Hansen is a 65 y.o. female with a hx of CAD, COPD, AI, HTN who presents for follow-up of CP. History of PCI to RCA in 2020.  She was she reports left-sided chest pain for the past 2 weeks.  Occurs intermittently.  She is active.  She requires a wheelchair.  She reports that the pain occurs at rest.  Described as pressure, sharp at times and dull at times.  She cannot really pinpoint what the pain is clearly like.  No identifiable triggers.  The pain resolves within 10 to 15 minutes.  She reports it may be elbow nitroglycerin but not all the time.  She reports that she has increased shortness of breath.  She has noticeable rhonchi on examination.  She does have known aortic insufficiency which is moderate to severe.  She has a very wide pulse pressure.  Noticeable diastolic murmur on examination today.  No increase shortness of breath.  No fevers or chills.  She does have a known history of bronchogenic carcinoma.  She is status post radiation therapy in May 2021.  She was seen by her oncologist on 01/26/2021.  Everything was stable.  Anemia known which is stable as well.  She was sent to the emergency room with concerns for COPD exacerbation.  She underwent a CT PE study that was negative.  She was noted to have severe wheezing on exam but they refused admission.  She appears to have been treated with a short course of steroids.  She has noticeable rhonchi on examination.  I suspect she is still in COPD exacerbation.  She needs to see a pulmonologist.  She saw a pulmonologist roughly 5 years ago.  She reports do not have a good experience but is okay to try again.  EKG in  office is unchanged from prior.  She reports no fevers or chills.  She does have cough and sputum production.  Troponins were negative in the emergency room.  Symptoms appear to have started around that time.  She does report that her breathing appears to be stable for her.  I did inform her that pulmonary consult likely indicated.  Problem List CAD -NSTEMI 06/28/2018 -PCI to RCA 2. Moderate to severe AI 3. HLD 4. HTN 5. COPD/Tobacco abuse -50 years  6. Lung CA s/p radiation  7. Anemia   Past Medical History: Past Medical History:  Diagnosis Date   Acute respiratory failure following trauma and surgery ([place]) 08/26/2012   Anxiety    Aortic insufficiency with aortic stenosis 04/2017   TTE December 2019: Mild AS with severe regurgitation.  Mild LA dilation.;;  TEE January 2016: Severe-type III aortic regurgitation (holodiastolic flow reversal in the a sending aorta & vena contracta =6.    CAD S/P percutaneous coronary angioplasty 11/2017   Proximal RCA PCI Synergy DES 3.5 mm x 16 mm (3.8 mm). Ost-mLM 30%. Ost-prox Cx 40%.   Collagenous colitis    Colon adenomas 2011   Constipation    Chronic abdominal pain and constipation   COPD (chronic obstructive pulmonary disease) (HCC)    Depression  Diverticulosis    Esophageal stricture 11/08/2012   Ulcer noted in 2010   GERD (gastroesophageal reflux disease)    Helicobacter pylori gastritis 2010   Pylera Tx   History of cholecystectomy    Hx of appendectomy    Hx of cancer of lung 1999   Hx of hysterectomy    Hyperlipidemia    Hypothyroidism    Iron deficiency anemia due to chronic blood loss 10/01/2019   Low back pain    Non-STEMI (non-ST elevated myocardial infarction) ([place]) 06/2018   RCA PCI   Osteoarthritis of knee    bilateral knee   Seizures (HCC)    Stroke (HCC)    CVA, hx of 97   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[place])     Past Surgical History: Past Surgical History:  Procedure Laterality Date   ABDOMINAL HYSTERECTOMY     complete  1992   ABDOMINAL SURGERY     Exploratory   APPENDECTOMY     BALLOON DILATION N/A 11/08/2012   Procedure: BALLOON DILATION;  Surgeon: Gatha Mayer, MD;  Location: [facility];  Service: Endoscopy;  Laterality: N/A;   CHOLECYSTECTOMY     COLONOSCOPY W/ BIOPSIES     multiple   CORONARY STENT INTERVENTION N/A 06/28/2018   Procedure: CORONARY STENT INTERVENTION;  Surgeon: Martinique, Peter M, MD;  Location: [place] [facility];  Service: Cardiovascular;  Laterality: N/A;  90% prox RCA -> PCI with Synergy DES 3.5 mm x 16 mm (3.8 mm).   CT CTA CORONARY W/CA SCORE W/CM &/OR WO/CM  05/2017   Coronary calcium score 2.9. Intermediate risk. Unusual noncalcified plaque in RCA --FFR negative   ESOPHAGOGASTRODUODENOSCOPY     w/baloon x 2   ESOPHAGOGASTRODUODENOSCOPY N/A 11/08/2012   Procedure: ESOPHAGOGASTRODUODENOSCOPY (EGD);  Surgeon: Gatha Mayer, MD;  Location: [facility];  Service: Endoscopy;  Laterality: N/A;   ESOPHAGOGASTRODUODENOSCOPY (EGD) WITH PROPOFOL N/A 08/25/2019   Procedure: ESOPHAGOGASTRODUODENOSCOPY (EGD) WITH PROPOFOL;  Surgeon: Gatha Mayer, MD;  Location: [facility];  Service: Endoscopy;  Laterality: N/A;   LEFT HEART CATH AND CORONARY ANGIOGRAPHY N/A 06/28/2018   Procedure: LEFT HEART CATH AND CORONARY ANGIOGRAPHY;  Surgeon: Martinique, Peter M, MD;  Location: [facility];  Service: Cardiovascular:  90% prox RCA -> DES PCI. Ost-mLM 30%. Ost-prox Cx 40%.   EF 55-65%. LVEDP 15 mmHg.    MALONEY DILATION  08/25/2019   Procedure: MALONEY DILATION;  Surgeon: Gatha Mayer, MD;  Location: [facility];  Service: Endoscopy;;   RIGHT HEART CATH N/A 07/11/2018   Procedure: RIGHT HEART CATH;  Surgeon: Leonie Man, MD;  Location: [facility];;   Relatively normal Right Heart Cath pressures: PA pressure 26/14 mmHg-mean 20 mmHg; PCWP 13 mmHg.  RAP 6 mmHg, RVP 28/3 mmHg-EDP 10 mmHg. Severely reduced cardiac output and index by Fick: 3.63 and 2.07.   TEE WITHOUT CARDIOVERSION N/A  07/11/2018   Procedure: TRANSESOPHAGEAL ECHOCARDIOGRAM (TEE);  Surgeon: [name], MD;  Location: [facility];  Service: Cardiology;;  Severe aortic regurgitation-type III. (suggested by holodiastolic flow reversal and descending aorta-vena contracta 6 mm).   TOTAL HIP ARTHROPLASTY     Left   TRANSTHORACIC ECHOCARDIOGRAM  05/2018   EF 60-65%.  No R WMA.  GR 1 DD.  Mild aortic stenosis with severe regurgitation.  Mild MR..  Only mild LV dilation noted.   TRANSTHORACIC ECHOCARDIOGRAM  12/2018    EF 60-65%. Mild LVH. Gr1 DD. Mod AoV thickening. Mod-Severe AI, ~ mlid AS.  Current Medications: Current Meds  Medication Sig   albuterol (PROVENTIL) (2.5 MG/3ML) 0.083% nebulizer solution INHALE 1 VIAL BY NEBULIZATION EVERY 4 HOURS AS NEEDED FOR WHEEZING OR SHORTNESS OF BREATH.   ALPRAZolam (XANAX) 0.25 MG tablet Take 1 tablet (0.25 mg total) by mouth at bedtime as needed for anxiety.   bisacodyl (DULCOLAX) 5 MG EC tablet Take 2 tablets (10 mg total) by mouth at bedtime. (Patient taking differently: Take 10 mg by mouth daily as needed.)   calcium carbonate (TUMS - DOSED IN MG ELEMENTAL CALCIUM) 500 MG chewable tablet Chew 1 tablet by mouth daily as needed for indigestion or heartburn.   clopidogrel (PLAVIX) 75 MG tablet TAKE 1 TABLET (75 MG TOTAL) BY MOUTH DAILY WITH BREAKFAST.   diltiazem (CARDIZEM CD) 360 MG 24 hr capsule Take 1 capsule (360 mg total) by mouth daily.   diphenhydrAMINE HCl, Sleep, (ZZZQUIL) 25 MG CAPS Take 50 mg by mouth at bedtime as needed (sleep).   docusate sodium (COLACE) 100 MG capsule Take 100 mg by mouth daily as needed for mild constipation or moderate constipation.   furosemide (LASIX) 20 MG tablet Take 1 tablet (20 mg total) by mouth daily.   guaiFENesin (ROBITUSSIN) 100 MG/5ML liquid Take 200 mg by mouth 2 (two) times daily as needed for cough.   HYDROcodone-acetaminophen (NORCO) 10-325 MG tablet Take 1 tablet by mouth every 6 (six) hours as needed for severe  pain. Please fill on or after 02/14/21   hydroxypropyl methylcellulose / hypromellose (ISOPTO TEARS / GONIOVISC) 2.5 % ophthalmic solution Place 1 drop into both eyes 3 (three) times daily as needed for dry eyes.   ipratropium-albuterol (DUONEB) 0.5-2.5 (3) MG/3ML SOLN Take 3 mLs by nebulization every 4 (four) hours as needed.   isosorbide mononitrate (IMDUR) 120 MG 24 hr tablet TAKE 1 TABLET (120 MG TOTAL) BY MOUTH DAILY.   ketoconazole (NIZORAL) 2 % cream APPLY TO AFFECTED AREA EVERY DAY   lactulose (CHRONULAC) 10 GM/15ML solution    levothyroxine (SYNTHROID) 150 MCG tablet TAKE 1 TABLET BY MOUTH EVERY DAY   naloxegol oxalate (MOVANTIK) 12.5 MG TABS tablet Take 1 tablet (12.5 mg total) by mouth daily.   naproxen sodium (ALEVE) 220 MG tablet Take 220 mg by mouth daily as needed. For Headache   nitroGLYCERIN (NITROSTAT) 0.4 MG SL tablet DISSOLVE ONE TABLET UNDER THE TONGUE EVERY 5 MINUTES AS NEEDED FOR CHEST PAIN.  DO NOT EXCEED A TOTAL OF 3 DOSES IN 15 MINUTES (Patient taking differently: DISSOLVE ONE TABLET UNDER THE TONGUE EVERY 5 MINUTES AS NEEDED FOR CHEST PAIN.  DO NOT EXCEED A TOTAL OF 3 DOSES IN 15 MINUTES)   OXYGEN Inhale 2.5 L/min into the lungs at bedtime.   pantoprazole (PROTONIX) 40 MG tablet Take 1 tablet (40 mg total) by mouth daily before breakfast. (Patient taking differently: Take 40 mg by mouth daily.)   PARoxetine (PAXIL) 40 MG tablet TAKE 1 TABLET BY MOUTH EVERY DAY   phenytoin (DILANTIN) 100 MG ER capsule TAKE 100 MG EVERY MORNING AND 200 MG EVERY NIGHT.   promethazine (PHENERGAN) 25 MG tablet Take 1 tablet (25 mg total) by mouth every 8 (eight) hours as needed for nausea or vomiting.   Respiratory Therapy Supplies (FLUTTER) DEVI As directed   traZODone (DESYREL) 100 MG tablet TAKE 1 TABLET BY MOUTH EVERYDAY AT BEDTIME   Vitamin D, Ergocalciferol, (DRISDOL) 1.25 MG (50000 UNIT) CAPS capsule TAKE 1 CAPSULE (50,000 UNITS TOTAL) BY MOUTH EVERY MONDAY.     Allergies:  Nitrofuran derivatives, Oxycodone hcl, Morphine, Penicillins, and Tape   Social History: Social History   Socioeconomic History   Marital status: Married    Spouse name: Not on file   Number of children: 2   Years of education: Not on file   Highest education level: Not on file  Occupational History   Occupation: disabled    Employer: DISABLED  Tobacco Use   Smoking status: Every Day    Packs/day: 0.50    Years: 48.00    Pack years: 24.00    Types: Cigarettes   Smokeless tobacco: Never   Tobacco comments:    09/21/20 8cigs/day.  Vaping Use   Vaping Use: Never used  Substance and Sexual Activity   Alcohol use: No   Drug use: No   Sexual activity: Not Currently  Other Topics Concern   Not on file  Social History Narrative   Married   2 children   No regular exercise         Social Determinants of Health   Financial Resource Strain: Not on file  Food Insecurity: No Food Insecurity   Worried About Charity fundraiser in the Last Year: Never true   Ran Out of Food in the Last Year: Never true  Transportation Needs: No Transportation Needs   Lack of Transportation (Medical): No   Lack of Transportation (Non-Medical): No  Physical Activity: Not on file  Stress: Not on file  Social Connections: Not on file     Family History: The patient's family history includes Coronary artery disease in her mother and another family member; Diabetes in her mother and son; Hypertension in her father; Kidney cancer in her sister; Kidney disease in an other family member. There is no history of Colon cancer, Stomach cancer, Esophageal cancer, Pancreatic cancer, or Liver disease.  ROS:   All other ROS reviewed and negative. Pertinent positives noted in the HPI.     EKGs/Labs/Other Studies Reviewed:   The following studies were personally reviewed by me today:  EKG:  EKG is ordered today.  The ekg ordered today demonstrates normal sinus rhythm heart rate 80 bpm, RSR prime pattern,  and was personally reviewed by me.  No change from prior.  Recent Labs: 01/26/2021: ALT 14; B Natriuretic Peptide 246.3; BUN 14; Creatinine, Ser 1.00; Hemoglobin 9.2; Platelets 250; Potassium 4.3; Sodium 138   Recent Lipid Panel    Component Value Date/Time   CHOL 128 06/29/2018 0007   CHOL 228 (H) 03/19/2018 0956   TRIG 184 (H) 06/29/2018 0007   HDL 35 (L) 06/29/2018 0007   HDL 48 03/19/2018 0956   CHOLHDL 3.7 06/29/2018 0007   VLDL 37 06/29/2018 0007   LDLCALC 56 06/29/2018 0007   LDLCALC 142 (H) 03/19/2018 0956   LDLDIRECT 187.0 07/14/2015 1059    Physical Exam:   VS:  BP (!) 142/52   Pulse 80   Ht 5\' 6"  (1.676 m)   Wt 160 lb 12.8 oz (72.9 kg)   SpO2 91%   BMI 25.95 kg/m    Wt Readings from Last 3 Encounters:  03/01/21 160 lb 12.8 oz (72.9 kg)  01/26/21 164 lb 6.4 oz (74.6 kg)  12/02/20 160 lb 3.2 oz (72.7 kg)    General: Well nourished, well developed, in no acute distress Head: Atraumatic, normal size  Eyes: PEERLA, EOMI  Neck: Supple, no JVD Endocrine: No thryomegaly Cardiac: Normal S1, S2; RRR; 2 out of 6 systolic ejection murmur, diastolic murmur heard Lungs: Diffuse rhonchi bilaterally  Abd: Soft, nontender, no hepatomegaly  Ext: No edema, pulses 2+ Musculoskeletal: No deformities, BUE and BLE strength normal and equal Skin: Warm and dry, no rashes   Neuro: Alert and oriented to person, place, time, and situation, CNII-XII grossly intact, no focal deficits  Psych: Normal mood and affect   ASSESSMENT:   BRITANIA SHREEVE is a 65 y.o. female who presents for the following: 1. Chest pain, unspecified type   2. Chronic obstructive pulmonary disease, unspecified COPD type ([place])     PLAN:   1. Chest pain, unspecified type -Possible cardiac chest pain.  She is basically immobile.  Reports has had episodes of sharp and dull pressure at times.  Can be relieved by nitroglycerin but not all the time.  EKG is normal.  Recent trip to the emergency room shows normal  cardiac enzymes.  Her EKG is unchanged from prior.  Her examination is notable for wheezing and rhonchi bilaterally.  She was sent to the emergency room on 01/26/2021 by her pulmonologist for concern for COPD exacerbation.  She has been treated.  She informs me her breathing is at baseline.  I think it is reasonable to proceed with a Lexi nuclear medicine stress test given her history of CAD with stenting to the RCA.  She does have moderate severe aortic insufficiency with a wide pulse pressure but I believe she is stable from this standpoint.  We will have her follow-up with Dr. Ellyn Hack after her stress test.  I think she does merit referral to pulmonology for her COPD.  This does not appear to be well controlled at all.  2. Chronic obstructive pulmonary disease, unspecified COPD type ([place]) -Noticeable rhonchi on exam.  Referral to pulmonary for further management.  Of note she is still smoking.  Advised to quit.   Shared Decision Making/Informed Consent The risks [chest pain, shortness of breath, cardiac arrhythmias, dizziness, blood pressure fluctuations, myocardial infarction, stroke/transient ischemic attack, nausea, vomiting, allergic reaction, radiation exposure, metallic taste sensation and life-threatening complications (estimated to be 1 in 10,000)], benefits (risk stratification, diagnosing coronary artery disease, treatment guidance) and alternatives of a nuclear stress test were discussed in detail with Ms. Whitaker and she agrees to proceed.  Disposition: Return if symptoms worsen or fail to improve.  Medication Adjustments/Labs and Tests Ordered: Current medicines are reviewed at length with the patient today.  Concerns regarding medicines are outlined above.  Orders Placed This Encounter  Procedures   Ambulatory referral to Pulmonology   Cardiac Stress Test: Informed Consent Details: Physician/Practitioner Attestation; Transcribe to consent form and obtain patient signature   MYOCARDIAL  PERFUSION IMAGING   EKG 12-Lead    No orders of the defined types were placed in this encounter.   Patient Instructions  Medication Instructions:  The current medical regimen is effective;  continue present plan and medications.  *If you need a refill on your cardiac medications before your next appointment, please call your pharmacy*   Testing/Procedures: Your physician has requested that you have a lexiscan myoview. For further information please visit [email or web address]. Please follow instruction sheet, as given.    Follow-Up: At [facility], you and your health needs are our priority.  As part of our continuing mission to provide you with exceptional heart care, we have created designated Provider Care Teams.  These Care Teams include your primary Cardiologist (physician) and Advanced Practice Providers (APPs -  Physician Assistants and Nurse Practitioners) who all work together to provide you with the care you need, when  you need it.  We recommend signing up for the patient portal called "MyChart".  Sign up information is provided on this After Visit Summary.  MyChart is used to connect with patients for Virtual Visits (Telemedicine).  Patients are able to view lab/test results, encounter notes, upcoming appointments, etc.  Non-urgent messages can be sent to your provider as well.   To learn more about what you can do with MyChart, go to [email or web address].    Your next appointment:   As needed  The format for your next appointment:   In Person  Provider:   Eleonore Chiquito, MD   Other Instructions Follow back with Dr.Harding pending test results. Referral to Pulmonology- they will contact you for an appointment.   Time Spent with Patient: I have spent a total of 35 minutes with patient reviewing hospital notes, telemetry, EKGs, labs and examining the patient as well as establishing an assessment and plan that was discussed with the patient.  > 50% of time was spent  in direct patient care.  Signed, Addison Naegeli. Audie Box, MD, [place]  [address] [place], [place] [address] [phone]  03/01/2021 4:59 PM

## 2021-03-01 ENCOUNTER — Ambulatory Visit: Payer: Medicare HMO | Admitting: Cardiovascular Disease

## 2021-03-01 ENCOUNTER — Other Ambulatory Visit: Payer: Self-pay | Admitting: Cardiology

## 2021-03-01 ENCOUNTER — Other Ambulatory Visit: Payer: Self-pay

## 2021-03-01 ENCOUNTER — Encounter: Payer: Self-pay | Admitting: Cardiovascular Disease

## 2021-03-01 VITALS — BP 142/52 | HR 80 | Ht 66.0 in | Wt 160.8 lb

## 2021-03-01 DIAGNOSIS — R079 Chest pain, unspecified: Secondary | ICD-10-CM

## 2021-03-01 DIAGNOSIS — J449 Chronic obstructive pulmonary disease, unspecified: Secondary | ICD-10-CM | POA: Diagnosis not present

## 2021-03-01 NOTE — Patient Instructions (Signed)
Medication Instructions:  The current medical regimen is effective;  continue present plan and medications.  *If you need a refill on your cardiac medications before your next appointment, please call your pharmacy*   Testing/Procedures: Your physician has requested that you have a lexiscan myoview. For further information please visit [email or web address]. Please follow instruction sheet, as given.    Follow-Up: At [facility], you and your health needs are our priority.  As part of our continuing mission to provide you with exceptional heart care, we have created designated Provider Care Teams.  These Care Teams include your primary Cardiologist (physician) and Advanced Practice Providers (APPs -  Physician Assistants and Nurse Practitioners) who all work together to provide you with the care you need, when you need it.  We recommend signing up for the patient portal called "MyChart".  Sign up information is provided on this After Visit Summary.  MyChart is used to connect with patients for Virtual Visits (Telemedicine).  Patients are able to view lab/test results, encounter notes, upcoming appointments, etc.  Non-urgent messages can be sent to your provider as well.   To learn more about what you can do with MyChart, go to [email or web address].    Your next appointment:   As needed  The format for your next appointment:   In Person  Provider:   Eleonore Chiquito, MD   Other Instructions Follow back with Dr.Harding pending test results. Referral to Pulmonology- they will contact you for an appointment.

## 2021-03-04 ENCOUNTER — Telehealth (HOSPITAL_COMMUNITY): Payer: Self-pay | Admitting: *Deleted

## 2021-03-04 NOTE — Telephone Encounter (Signed)
Close encounter 

## 2021-03-06 ENCOUNTER — Other Ambulatory Visit: Payer: Self-pay | Admitting: Cardiology

## 2021-03-08 ENCOUNTER — Other Ambulatory Visit: Payer: Self-pay

## 2021-03-08 ENCOUNTER — Ambulatory Visit (HOSPITAL_COMMUNITY)
Admission: RE | Admit: 2021-03-08 | Discharge: 2021-03-08 | Disposition: A | Discharge status: Home / Self Care | Payer: Medicare HMO | Source: Ambulatory Visit | Attending: Internal Medicine | Admitting: Internal Medicine

## 2021-03-08 DIAGNOSIS — R079 Chest pain, unspecified: Secondary | ICD-10-CM | POA: Diagnosis not present

## 2021-03-08 LAB — MYOCARDIAL PERFUSION IMAGING
Base ST Depression (mm): 0 mm
LV dias vol: 135 mL (ref 46–106)
LV sys vol: 55 mL
Nuc Stress EF: 59 %
Peak HR: 88 {beats}/min
Rest HR: 79 {beats}/min
Rest Nuclear Isotope Dose: 9.5 mCi
SDS: 0
SRS: 0
SSS: 0
ST Depression (mm): 0 mm
Stress Nuclear Isotope Dose: 28.6 mCi
TID: 0.98

## 2021-03-08 MED ORDER — TECHNETIUM TC 99M TETROFOSMIN IV KIT
9.5000 | PACK | Freq: Once | INTRAVENOUS | Status: AC | PRN
  Administered 2021-03-08: 9.5 via INTRAVENOUS
  Filled 2021-03-08: qty 10

## 2021-03-08 MED ORDER — REGADENOSON 0.4 MG/5ML IV SOLN
0.4000 mg | Freq: Once | INTRAVENOUS | Status: AC
  Administered 2021-03-08: 0.4 mg via INTRAVENOUS

## 2021-03-08 MED ORDER — AMINOPHYLLINE 25 MG/ML IV SOLN
100.0000 mg | Freq: Once | INTRAVENOUS | Status: AC
  Administered 2021-03-08: 100 mg via INTRAVENOUS

## 2021-03-08 MED ORDER — TECHNETIUM TC 99M TETROFOSMIN IV KIT
28.6000 | PACK | Freq: Once | INTRAVENOUS | Status: AC | PRN
  Administered 2021-03-08: 28.6 via INTRAVENOUS
  Filled 2021-03-08: qty 29

## 2021-03-10 ENCOUNTER — Other Ambulatory Visit: Payer: Self-pay

## 2021-03-10 ENCOUNTER — Encounter: Payer: Self-pay | Admitting: Internal Medicine

## 2021-03-10 ENCOUNTER — Ambulatory Visit (INDEPENDENT_AMBULATORY_CARE_PROVIDER_SITE_OTHER): Payer: Medicare HMO | Admitting: Internal Medicine

## 2021-03-10 VITALS — BP 142/50 | HR 84 | Temp 98.5°F | Ht 66.0 in | Wt 158.8 lb

## 2021-03-10 DIAGNOSIS — G8929 Other chronic pain: Secondary | ICD-10-CM

## 2021-03-10 DIAGNOSIS — M5440 Lumbago with sciatica, unspecified side: Secondary | ICD-10-CM

## 2021-03-10 DIAGNOSIS — I25119 Atherosclerotic heart disease of native coronary artery with unspecified angina pectoris: Secondary | ICD-10-CM

## 2021-03-10 DIAGNOSIS — Z23 Encounter for immunization: Secondary | ICD-10-CM

## 2021-03-10 DIAGNOSIS — C3490 Malignant neoplasm of unspecified part of unspecified bronchus or lung: Secondary | ICD-10-CM

## 2021-03-10 DIAGNOSIS — J449 Chronic obstructive pulmonary disease, unspecified: Secondary | ICD-10-CM

## 2021-03-10 DIAGNOSIS — J441 Chronic obstructive pulmonary disease with (acute) exacerbation: Secondary | ICD-10-CM | POA: Diagnosis not present

## 2021-03-10 MED ORDER — METHYLPREDNISOLONE ACETATE 80 MG/ML IJ SUSP
80.0000 mg | Freq: Once | INTRAMUSCULAR | Status: AC
  Administered 2021-03-10: 80 mg via INTRAMUSCULAR

## 2021-03-10 MED ORDER — ALPRAZOLAM 0.25 MG PO TABS: 0.2500 mg | ORAL_TABLET | Freq: Every evening | ORAL | 2 refills | Status: DC | PRN

## 2021-03-10 MED ORDER — HYDROCODONE-ACETAMINOPHEN 10-325 MG PO TABS: 1.0000 | ORAL_TABLET | Freq: Four times a day (QID) | ORAL | 0 refills | Status: DC | PRN

## 2021-03-10 MED ORDER — METHYLPREDNISOLONE 4 MG PO TBPK: ORAL_TABLET | ORAL | 0 refills | Status: DC

## 2021-03-10 NOTE — Assessment & Plan Note (Signed)
Continue w/Norco prn  Potential benefits of a long term opioids use as well as potential risks (i.e. addiction risk, apnea etc) and complications (i.e. Somnolence, constipation and others) were explained to the patient and were aknowledged. Narcane nasal prn Do not take Norco w/Xanax

## 2021-03-10 NOTE — Progress Notes (Signed)
Subjective:  Patient ID: Kaylee Hansen, female    DOB: September 04, 1955  Age: 65 y.o. MRN: [id number]  CC: Follow-up (3 MONTH F/U)   HPI  Dwana L Pompa presents for CP, dyspnea, CAD, COPD, lung cancer CL stress test on 03/08/21:   Findings are consistent with ischemia. The study is high risk.   No ST deviation was noted.   LV perfusion is abnormal. There is evidence of ischemia. Defect 1: There is a medium defect with moderate reduction in uptake present in the apical to mid anterior location(s) that is reversible. There is normal wall motion in the defect area. Consistent with ischemia. Defect 2: There is a small defect with moderate reduction in uptake present in the mid to basal inferolateral location(s) that is reversible. There is normal wall motion in the defect area. Consistent with ischemia.   Left ventricular function is normal. Nuclear stress EF: 59 %. End diastolic cavity size is mildly enlarged. End systolic cavity size is normal.   Prior study available for comparison from 03/28/2002. There are changes compared to prior study which appear to be new.   Two moderate perfusion defects concerning for ischemia. Findings will be communicated to Dr. Audie Box.  Outpatient Medications Prior to Visit  Medication Sig Dispense Refill   albuterol (PROVENTIL) (2.5 MG/3ML) 0.083% nebulizer solution INHALE 1 VIAL BY NEBULIZATION EVERY 4 HOURS AS NEEDED FOR WHEEZING OR SHORTNESS OF BREATH. 150 mL 5   bisacodyl (DULCOLAX) 5 MG EC tablet Take 2 tablets (10 mg total) by mouth at bedtime. (Patient taking differently: Take 10 mg by mouth daily as needed.) 30 tablet    calcium carbonate (TUMS - DOSED IN MG ELEMENTAL CALCIUM) 500 MG chewable tablet Chew 1 tablet by mouth daily as needed for indigestion or heartburn.     clopidogrel (PLAVIX) 75 MG tablet TAKE 1 TABLET (75 MG TOTAL) BY MOUTH DAILY WITH BREAKFAST. 90 tablet 3   diltiazem (CARDIZEM CD) 360 MG 24 hr capsule Take 1 capsule (360 mg total) by mouth  daily. 90 capsule 3   diphenhydrAMINE HCl, Sleep, (ZZZQUIL) 25 MG CAPS Take 50 mg by mouth at bedtime as needed (sleep).     docusate sodium (COLACE) 100 MG capsule Take 100 mg by mouth daily as needed for mild constipation or moderate constipation.     furosemide (LASIX) 20 MG tablet Take 1 tablet (20 mg total) by mouth daily. 90 tablet 3   guaiFENesin (ROBITUSSIN) 100 MG/5ML liquid Take 200 mg by mouth 2 (two) times daily as needed for cough.     hydroxypropyl methylcellulose / hypromellose (ISOPTO TEARS / GONIOVISC) 2.5 % ophthalmic solution Place 1 drop into both eyes 3 (three) times daily as needed for dry eyes.     ipratropium-albuterol (DUONEB) 0.5-2.5 (3) MG/3ML SOLN Take 3 mLs by nebulization every 4 (four) hours as needed. 360 mL 0   isosorbide mononitrate (IMDUR) 120 MG 24 hr tablet TAKE 1 TABLET (120 MG TOTAL) BY MOUTH DAILY. 90 tablet 2   ketoconazole (NIZORAL) 2 % cream APPLY TO AFFECTED AREA EVERY DAY 45 g 1   lactulose (CHRONULAC) 10 GM/15ML solution      levothyroxine (SYNTHROID) 150 MCG tablet TAKE 1 TABLET BY MOUTH EVERY DAY 90 tablet 3   naloxegol oxalate (MOVANTIK) 12.5 MG TABS tablet Take 1 tablet (12.5 mg total) by mouth daily. 30 tablet 0   naproxen sodium (ALEVE) 220 MG tablet Take 220 mg by mouth daily as needed. For Headache     nitroGLYCERIN (  NITROSTAT) 0.4 MG SL tablet DISSOLVE ONE TABLET UNDER THE TONGUE EVERY 5 MINUTES AS NEEDED FOR CHEST PAIN.  DO NOT EXCEED A TOTAL OF 3 DOSES IN 15 MINUTES (Patient taking differently: DISSOLVE ONE TABLET UNDER THE TONGUE EVERY 5 MINUTES AS NEEDED FOR CHEST PAIN.  DO NOT EXCEED A TOTAL OF 3 DOSES IN 15 MINUTES) 25 tablet 6   OXYGEN Inhale 2.5 L/min into the lungs at bedtime.     pantoprazole (PROTONIX) 40 MG tablet Take 1 tablet (40 mg total) by mouth daily before breakfast. (Patient taking differently: Take 40 mg by mouth daily.) 90 tablet 3   PARoxetine (PAXIL) 40 MG tablet TAKE 1 TABLET BY MOUTH EVERY DAY 90 tablet 3   phenytoin  (DILANTIN) 100 MG ER capsule TAKE 100 MG EVERY MORNING AND 200 MG EVERY NIGHT. 270 capsule 3   promethazine (PHENERGAN) 25 MG tablet Take 1 tablet (25 mg total) by mouth every 8 (eight) hours as needed for nausea or vomiting. 30 tablet 0   Respiratory Therapy Supplies (FLUTTER) DEVI As directed 1 each 0   rosuvastatin (CRESTOR) 20 MG tablet TAKE 1 TABLET BY MOUTH EVERY DAY 90 tablet 3   traZODone (DESYREL) 100 MG tablet TAKE 1 TABLET BY MOUTH EVERYDAY AT BEDTIME 90 tablet 3   Vitamin D, Ergocalciferol, (DRISDOL) 1.25 MG (50000 UNIT) CAPS capsule TAKE 1 CAPSULE (50,000 UNITS TOTAL) BY MOUTH EVERY MONDAY. 12 capsule 3   ALPRAZolam (XANAX) 0.25 MG tablet Take 1 tablet (0.25 mg total) by mouth at bedtime as needed for anxiety. 90 tablet 1   HYDROcodone-acetaminophen (NORCO) 10-325 MG tablet Take 1 tablet by mouth every 6 (six) hours as needed for severe pain. Please fill on or after 02/14/21 120 tablet 0   albuterol (PROVENTIL HFA;VENTOLIN HFA) 108 (90 Base) MCG/ACT inhaler Inhale 2 puffs into the lungs every 6 (six) hours as needed for wheezing or shortness of breath. 1 Inhaler 5   SUMAtriptan (IMITREX) 100 MG tablet Take 1 tablet (100 mg total) by mouth once for 1 dose. May repeat in 2 hours if headache persists or recurs. 9 tablet 3   No facility-administered medications prior to visit.    ROS: Review of Systems  Constitutional:  Positive for fatigue. Negative for activity change, appetite change, chills and unexpected weight change.  HENT:  Negative for congestion, mouth sores and sinus pressure.   Eyes:  Negative for visual disturbance.  Respiratory:  Positive for cough, chest tightness, shortness of breath and wheezing.   Gastrointestinal:  Negative for abdominal pain and nausea.  Genitourinary:  Negative for difficulty urinating, frequency and vaginal pain.  Musculoskeletal:  Positive for arthralgias, back pain, gait problem, neck pain and neck stiffness.  Skin:  Negative for pallor and  rash.  Neurological:  Positive for dizziness and weakness. Negative for tremors, numbness and headaches.  Psychiatric/Behavioral:  Positive for decreased concentration and sleep disturbance. Negative for confusion and suicidal ideas. The patient is nervous/anxious.    Objective:  BP (!) 142/50 (BP Location: Left Arm)   Pulse 84   Temp 98.5 F (36.9 C) (Oral)   Ht 5\' 6"  (1.676 m)   Wt 158 lb 12.8 oz (72 kg)   SpO2 92%   BMI 25.63 kg/m   BP Readings from Last 3 Encounters:  03/10/21 (!) 142/50  03/01/21 (!) 142/52  02/02/21 (!) 123/37    Wt Readings from Last 3 Encounters:  03/10/21 158 lb 12.8 oz (72 kg)  03/08/21 160 lb (72.6 kg)  03/01/21  160 lb 12.8 oz (72.9 kg)    Physical Exam Constitutional:      General: She is not in acute distress.    Appearance: She is well-developed. She is obese. She is ill-appearing.  HENT:     Head: Normocephalic.     Right Ear: External ear normal.     Left Ear: External ear normal.     Nose: Nose normal.  Eyes:     General:        Right eye: No discharge.        Left eye: No discharge.     Conjunctiva/sclera: Conjunctivae normal.     Pupils: Pupils are equal, round, and reactive to light.  Neck:     Thyroid: No thyromegaly.     Vascular: No JVD.     Trachea: No tracheal deviation.  Cardiovascular:     Rate and Rhythm: Normal rate and regular rhythm.     Heart sounds: Normal heart sounds.  Pulmonary:     Effort: Respiratory distress present.     Breath sounds: No stridor. Wheezing, rhonchi and rales present.  Abdominal:     General: Bowel sounds are normal. There is no distension.     Palpations: Abdomen is soft. There is no mass.     Tenderness: There is no abdominal tenderness. There is no guarding or rebound.  Musculoskeletal:        General: Tenderness present.     Cervical back: Normal range of motion and neck supple. No rigidity.  Lymphadenopathy:     Cervical: No cervical adenopathy.  Skin:    Findings: No erythema or  rash.  Neurological:     Cranial Nerves: No cranial nerve deficit.     Motor: Weakness present. No abnormal muscle tone.     Coordination: Coordination abnormal.     Gait: Gait abnormal.     Deep Tendon Reflexes: Reflexes normal.  Psychiatric:        Behavior: Behavior normal.        Thought Content: Thought content normal.        Judgment: Judgment normal.   In a w/c  Lab Results  Component Value Date   WBC 5.8 01/26/2021   HGB 9.2 (L) 01/26/2021   HCT 29.3 (L) 01/26/2021   PLT 250 01/26/2021   GLUCOSE 122 (H) 01/26/2021   CHOL 128 06/29/2018   TRIG 184 (H) 06/29/2018   HDL 35 (L) 06/29/2018   LDLDIRECT 187.0 07/14/2015   LDLCALC 56 06/29/2018   ALT 14 01/26/2021   AST 17 01/26/2021   NA 138 01/26/2021   K 4.3 01/26/2021   CL 101 01/26/2021   CREATININE 1.00 01/26/2021   BUN 14 01/26/2021   CO2 30 01/26/2021   TSH 1.84 01/01/2019   INR 0.9 09/22/2019   HGBA1C  09/17/2008    5.0 (NOTE)   The ADA recommends the following therapeutic goal for glycemic   control related to Hgb A1C measurement:   Goal of Therapy:   < 7.0% Hgb A1C   Reference: American Diabetes Association: Clinical Practice   Recommendations 2008, Diabetes Care,  2008, 31:(Suppl 1).    MYOCARDIAL PERFUSION IMAGING  Result Date: 03/08/2021   Findings are consistent with ischemia. The study is high risk.   No ST deviation was noted.   LV perfusion is abnormal. There is evidence of ischemia. Defect 1: There is a medium defect with moderate reduction in uptake present in the apical to mid anterior location(s) that is reversible. There is  normal wall motion in the defect area. Consistent with ischemia. Defect 2: There is a small defect with moderate reduction in uptake present in the mid to basal inferolateral location(s) that is reversible. There is normal wall motion in the defect area. Consistent with ischemia.   Left ventricular function is normal. Nuclear stress EF: 59 %. End diastolic cavity size is mildly  enlarged. End systolic cavity size is normal.   Prior study available for comparison from 03/28/2002. There are changes compared to prior study which appear to be new. Two moderate perfusion defects concerning for ischemia. Findings will be communicated to Dr. Audie Box.    Assessment & Plan:   Problem List Items Addressed This Visit     Coronary artery disease involving native coronary artery of native heart with angina pectoris ([place]) (Chronic)    CL stress test on 03/08/21:   Findings are consistent with ischemia. The study is high risk.   No ST deviation was noted.   LV perfusion is abnormal. There is evidence of ischemia. Defect 1: There is a medium defect with moderate reduction in uptake present in the apical to mid anterior location(s) that is reversible. There is normal wall motion in the defect area. Consistent with ischemia. Defect 2: There is a small defect with moderate reduction in uptake present in the mid to basal inferolateral location(s) that is reversible. There is normal wall motion in the defect area. Consistent with ischemia.   Left ventricular function is normal. Nuclear stress EF: 59 %. End diastolic cavity size is mildly enlarged. End systolic cavity size is normal.   Prior study available for comparison from 03/28/2002. There are changes compared to prior study which appear to be new.  NTG prn      COPD (chronic obstructive pulmonary disease) ([place])    [facility] consultation is pending - Dr Loanne Drilling  Depo-Mdrol IM 80 mg Cont other Rx      Relevant Medications   methylPREDNISolone (MEDROL DOSEPAK) 4 MG TBPK tablet   COPD with acute exacerbation (HCC)    Depo-Medrol 80 mg IM Medrol pack      Relevant Medications   methylPREDNISolone (MEDROL DOSEPAK) 4 MG TBPK tablet   Low back pain    Continue w/Norco prn  Potential benefits of a long term opioids use as well as potential risks (i.e. addiction risk, apnea etc) and complications (i.e. Somnolence, constipation and  others) were explained to the patient and were aknowledged. Narcane nasal prn Do not take Norco w/Xanax      Relevant Medications   methylPREDNISolone (MEDROL DOSEPAK) 4 MG TBPK tablet   HYDROcodone-acetaminophen (NORCO) 10-325 MG tablet   HYDROcodone-acetaminophen (NORCO) 10-325 MG tablet   HYDROcodone-acetaminophen (NORCO) 10-325 MG tablet   Malignant neoplasm of bronchus and lung (HCC)    F/u w/Dr Marin Olp      Relevant Medications   methylPREDNISolone (MEDROL DOSEPAK) 4 MG TBPK tablet   ALPRAZolam (XANAX) 0.25 MG tablet   Other Visit Diagnoses     Needs flu shot    -  Primary   Relevant Orders   Flu Vaccine QUAD High Dose(Fluad) (Completed)         Follow-up: No follow-ups on file.  Walker Kehr, MD

## 2021-03-10 NOTE — Assessment & Plan Note (Addendum)
Depo-Medrol 80 mg IM Medrol pack

## 2021-03-10 NOTE — Assessment & Plan Note (Signed)
[facility] consultation is pending - Dr Loanne Drilling  Depo-Mdrol IM 80 mg Cont other Rx

## 2021-03-10 NOTE — Assessment & Plan Note (Signed)
CL stress test on 03/08/21: .  Findings are consistent with ischemia. The study is high risk. .  No ST deviation was noted. .  LV perfusion is abnormal. There is evidence of ischemia. Defect 1: There is a medium defect with moderate reduction in uptake present in the apical to mid anterior location(s) that is reversible. There is normal wall motion in the defect area. Consistent with ischemia. Defect 2: There is a small defect with moderate reduction in uptake present in the mid to basal inferolateral location(s) that is reversible. There is normal wall motion in the defect area. Consistent with ischemia. .  Left ventricular function is normal. Nuclear stress EF: 59 %. End diastolic cavity size is mildly enlarged. End systolic cavity size is normal. .  Prior study available for comparison from 03/28/2002. There are changes compared to prior study which appear to be new.  NTG prn

## 2021-03-10 NOTE — Assessment & Plan Note (Signed)
F/u w/Dr Ennever 

## 2021-03-10 NOTE — Addendum Note (Signed)
Addended by: Earnstine Regal on: 03/10/2021 03:06 PM   Modules accepted: Orders

## 2021-03-14 ENCOUNTER — Ambulatory Visit (INDEPENDENT_AMBULATORY_CARE_PROVIDER_SITE_OTHER): Payer: Medicare HMO | Admitting: *Deleted

## 2021-03-14 DIAGNOSIS — J449 Chronic obstructive pulmonary disease, unspecified: Secondary | ICD-10-CM

## 2021-03-14 DIAGNOSIS — C3490 Malignant neoplasm of unspecified part of unspecified bronchus or lung: Secondary | ICD-10-CM
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
## 2021-03-14 NOTE — Patient Instructions (Addendum)
Visit Information  Kaylee Hansen, it was nice talking with you today.   Please read over the attached information, and start now to take action to prevent falls: please begin using your walker at home.  Falls can cause serious injury to yourself or your husband as he tries to assist you in getting up after a fall   I look forward to talking to you again for an update on Monday, June 13, 2021 at 11:30 am- please be listening out for my call that day.  I will call as close to 11:30 am as possible.   If you need to cancel or re-schedule our telephone visit, please call [phone] and one of our care guides will be happy to assist you.   I look forward to hearing about your progress.   Please don't hesitate to contact me if I can be of assistance to you before our next scheduled telephone appointment.   Oneta Rack, RN, BSN, [facility] RN Care Coordination- [place] [phone]: direct office [phone]: mobile   PATIENT GOALS:  Goals Addressed             This Visit's Progress    Track and Manage My Symptoms-COPD   On track    Timeframe:  Long-Range Goal Priority:  Medium Start Date:          01/24/21                   Expected End Date:     01/24/22                  Follow Up Date 06/13/2021    Develop and follow a rescue plan if symptoms flare up: please contact your care provider promptly if you believe your breathing is starting to get worse Eliminate symptom triggers at home: please do not smoke near your home Oxygen concentrator; please try to decrease and stop smoking- this is the most important thing you can do to help your breathing Keep all doctor and follow-up appointments- please make sure to communicate your healthcare needs and any problems you are having with your doctors and your care team Please try to limit the salt in your diet: eating salt can make you retain fluid, which can make your breathing worse Please stay in touch  with the Pharmacy team at Dr. Judeen Hammans office: they are available to help you manage your medications Please start using your walker at home when you are not using your electric or manual wheelchair: falls can cause serious injuries for you and for your husband who assists you in getting up after a fall   Why is this important?   Tracking your symptoms and other information about your health helps your doctor plan your care.  Write down the symptoms, the time of day, what you were doing and what medicine you are taking.  You will soon learn how to manage your symptoms.            Understanding Your Risk for Falls Each year, millions of people have serious injuries from falls. It is important to understand your risk for falling. Talk with your health care provider about your risk and what you can do to lower it. There are actions you can take at home to lower your risk and prevent falls. If you do have a serious fall, make sure to tell your health care provider. Falling once raises your risk of falling again. How can  falls affect me? Serious injuries from falls are common. These include: Broken bones, such as hip fractures. Head injuries, such as traumatic brain injuries (TBI) or concussion. A fear of falling can cause you to avoid activities and stay at home. This can make your muscles weaker and actually raise your risk for a fall. What can increase my risk? There are a number of risk factors that increase your risk for falling. The more risk factors you have, the higher your risk of falling. Serious injuries from a fall happen most often to people older than age 28. Children and young adults ages 8-29 are also at higher risk. Common risk factors include: Weakness in the lower body. Lack (deficiency) of vitamin D. Being generally weak or confused due to long-term (chronic) illness. Dizziness or balance problems. Poor vision. Medicines that cause dizziness or drowsiness. These can  include medicines for your blood pressure, heart, anxiety, insomnia, or edema, as well as pain medicines and muscle relaxants. Other risk factors include: Drinking alcohol. Having had a fall in the past. Having depression. Having foot pain or wearing improper footwear. Working at a dangerous job. Having any of the following in your home: Tripping hazards, such as floor clutter or loose rugs. Poor lighting. Pets. Having dementia or memory loss. What actions can I take to lower my risk of falling?   Physical activity Maintain physical fitness. Do strength and balance exercises. Consider taking a regular class to build strength and balance. Yoga and tai chi are good options. Vision Have your eyes checked every year and your vision prescription updated as needed. Walking aids and footwear Wear nonskid shoes. Do not wear high heels. Do not walk around the house in socks or slippers. Use a cane or walker as told by your health care provider. Home safety Attach secure railings on both sides of your stairs. Install grab bars for your tub, shower, and toilet. Use a bath mat in your tub or shower. Use good lighting in all rooms. Keep a flashlight near your bed. Make sure there is a clear path from your bed to the bathroom. Use night-lights. Do not use throw rugs. Make sure all carpeting is taped or tacked down securely. Remove all clutter from walkways and stairways, including extension cords. Repair uneven or broken steps. Avoid walking on icy or slippery surfaces. Walk on the grass instead of on icy or slick sidewalks. Use ice melt to get rid of ice on walkways. Use a cordless phone. Questions to ask your health care provider Can you help me check my risk for a fall? Do any of my medicines make me more likely to fall? Should I take a vitamin D supplement? What exercises can I do to improve my strength and balance? Should I make an appointment to have my vision checked? Do I need a bone  density test to check for weak bones or osteoporosis? Would it help to use a cane or a walker? Where to find more information Centers for Disease Control and Prevention, [employer]: http://www.wolf.info/ Community-Based Fall Prevention Programs: http://www.wolf.info/ National Institute on Aging: http://kim-miller.com/ Contact a health care provider if: You fall at home. You are afraid of falling at home. You feel weak, drowsy, or dizzy. Summary Serious injuries from a fall happen most often to people older than age 10. Children and young adults ages 40-29 are also at higher risk. Talk with your health care provider about your risks for falling and how to lower those risks. Taking certain precautions at  home can lower your risk for falling. If you fall, always tell your health care provider. This information is not intended to replace advice given to you by your health care provider. Make sure you discuss any questions you have with your health care provider. Document Revised: 01/14/2020 Document Reviewed: 01/14/2020 Elsevier Patient Education  2022 [place].   Patient verbalizes understanding of instructions provided today and agrees to view in [place].  Telephone follow up appointment with care management team member scheduled for:  Monday, June 13, 2021 at 11:30 am The patient has been provided with contact information for the care management team and has been advised to call with any health related questions or concerns.   Oneta Rack, RN, BSN, [facility] RN Care Coordination- [place] [phone]: direct office [phone]: mobile

## 2021-03-15 ENCOUNTER — Telehealth: Payer: Self-pay

## 2021-03-15 ENCOUNTER — Inpatient Hospital Stay: Payer: Medicare HMO | Attending: Hematology & Oncology

## 2021-03-15 ENCOUNTER — Inpatient Hospital Stay: Payer: Medicare HMO | Admitting: Hematology & Oncology

## 2021-03-15 ENCOUNTER — Encounter: Payer: Self-pay | Admitting: Hematology & Oncology

## 2021-03-15 ENCOUNTER — Other Ambulatory Visit: Payer: Self-pay

## 2021-03-15 VITALS — BP 119/32 | HR 77 | Temp 98.6°F | Resp 18 | Wt 159.8 lb

## 2021-03-15 DIAGNOSIS — D500 Iron deficiency anemia secondary to blood loss (chronic): Secondary | ICD-10-CM

## 2021-03-15 DIAGNOSIS — D649 Anemia, unspecified: Secondary | ICD-10-CM | POA: Insufficient documentation

## 2021-03-15 DIAGNOSIS — C3492 Malignant neoplasm of unspecified part of left bronchus or lung: Secondary | ICD-10-CM | POA: Diagnosis not present

## 2021-03-15 DIAGNOSIS — C3490 Malignant neoplasm of unspecified part of unspecified bronchus or lung: Secondary | ICD-10-CM

## 2021-03-15 LAB — CMP (CANCER CENTER ONLY)
ALT: 13 U/L (ref 0–44)
AST: 18 U/L (ref 15–41)
Albumin: 3.7 g/dL (ref 3.5–5.0)
Alkaline Phosphatase: 118 U/L (ref 38–126)
Anion gap: 7 (ref 5–15)
BUN: 17 mg/dL (ref 8–23)
CO2: 34 mmol/L — ABNORMAL HIGH (ref 22–32)
Calcium: 9.3 mg/dL (ref 8.9–10.3)
Chloride: 100 mmol/L (ref 98–111)
Creatinine: 0.89 mg/dL (ref 0.44–1.00)
GFR, Estimated: >60 mL/min (ref >60)
Glucose, Bld: 99 mg/dL (ref 70–99)
Potassium: 4.2 mmol/L (ref 3.5–5.1)
Sodium: 141 mmol/L (ref 135–145)
Total Bilirubin: 0.2 mg/dL — ABNORMAL LOW (ref 0.3–1.2)
Total Protein: 6.8 g/dL (ref 6.5–8.1)

## 2021-03-15 LAB — FERRITIN: Ferritin: 30 ng/mL (ref 11–307)

## 2021-03-15 LAB — CBC WITH DIFFERENTIAL (CANCER CENTER ONLY)
Abs Immature Granulocytes: 0.08 10*3/uL — ABNORMAL HIGH (ref 0.00–0.07)
Basophils Absolute: 0.1 10*3/uL (ref 0.0–0.1)
Basophils Relative: 1 %
Eosinophils Absolute: 0.2 10*3/uL (ref 0.0–0.5)
Eosinophils Relative: 3 %
HCT: 35.4 % — ABNORMAL LOW (ref 36.0–46.0)
Hemoglobin: 10.6 g/dL — ABNORMAL LOW (ref 12.0–15.0)
Immature Granulocytes: 1 %
Lymphocytes Relative: 22 %
Lymphs Abs: 1.4 10*3/uL (ref 0.7–4.0)
MCH: 29.2 pg (ref 26.0–34.0)
MCHC: 29.9 g/dL — ABNORMAL LOW (ref 30.0–36.0)
MCV: 97.5 fL (ref 80.0–100.0)
Monocytes Absolute: 0.4 10*3/uL (ref 0.1–1.0)
Monocytes Relative: 6 %
Neutro Abs: 4.3 10*3/uL (ref 1.7–7.7)
Neutrophils Relative %: 67 %
Platelet Count: 251 10*3/uL (ref 150–400)
RBC: 3.63 MIL/uL — ABNORMAL LOW (ref 3.87–5.11)
RDW: 14 % (ref 11.5–15.5)
WBC Count: 6.3 10*3/uL (ref 4.0–10.5)
nRBC: 0 % (ref 0.0–0.2)

## 2021-03-15 LAB — IRON AND TIBC
Iron: 37 ug/dL — ABNORMAL LOW (ref 41–142)
Saturation Ratios: 13 % — ABNORMAL LOW (ref 21–57)
TIBC: 276 ug/dL (ref 236–444)
UIBC: 240 ug/dL (ref 120–384)

## 2021-03-15 LAB — LACTATE DEHYDROGENASE: LDH: 121 U/L (ref 98–192)

## 2021-03-15 LAB — RETICULOCYTES
Immature Retic Fract: 15.5 % (ref 2.3–15.9)
RBC.: 3.59 MIL/uL — ABNORMAL LOW (ref 3.87–5.11)
Retic Count, Absolute: 58.2 10*3/uL (ref 19.0–186.0)
Retic Ct Pct: 1.6 % (ref 0.4–3.1)

## 2021-03-15 NOTE — Telephone Encounter (Signed)
-----   Message from [name], MD sent at 03/15/2021  2:23 PM EDT ----- Call - the iron is very low still!!  She needs 3 doses of IV Iron.  Laurey Arrow

## 2021-03-15 NOTE — Telephone Encounter (Signed)
LMTCB regarding lab results.

## 2021-03-15 NOTE — Progress Notes (Signed)
Hematology and Oncology Follow Up Visit  PRISMA DECARLO [id number] 1956-04-01 65 y.o. 03/15/2021.     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 Riches is back for follow-up.  As always, she comes in a wheelchair.  Her husband comes with her.  She is still having a lot of problems with her heart and lungs.  She had a recent cardiac stress test.  It is looks like there is a couple areas of ischemia.  She will see the cardiologist next week.  She is done well with iron.  We gave her iron back in August.  Her hemoglobin is up to 10.6.  Her erythropoietin level is 46.  I suppose we could always utilize Epogen if necessary.  She has had shortness of breath.  She is not wearing oxygen right now.  I think she has oxygen at home.  Clearly, her prognosis is definitely dictated by her cardiopulmonary status.  Overall, I would say that her performance status is ECOG 3.  Medications:  Current Outpatient Medications:    ALPRAZolam (XANAX) 0.25 MG tablet, Take 1 tablet (0.25 mg total) by mouth at bedtime as needed for anxiety., Disp: 90 tablet, Rfl: 2   bisacodyl (DULCOLAX) 5 MG EC tablet, Take 2 tablets (10 mg total) by mouth at bedtime. (Patient taking differently: Take 10 mg by mouth daily as needed.), Disp: 30 tablet, Rfl:    calcium carbonate (TUMS - DOSED IN MG ELEMENTAL CALCIUM) 500 MG chewable tablet, Chew 1 tablet by mouth daily as needed for indigestion or heartburn., Disp: , Rfl:    clopidogrel (PLAVIX) 75 MG tablet, TAKE 1 TABLET (75 MG TOTAL) BY MOUTH DAILY WITH BREAKFAST., Disp: 90 tablet, Rfl: 3   diltiazem (CARDIZEM CD) 360 MG 24 hr capsule, Take 1 capsule (360 mg total) by mouth daily., Disp: 90 capsule, Rfl: 3   diphenhydrAMINE HCl, Sleep, (ZZZQUIL) 25 MG CAPS, Take 50 mg by mouth at bedtime as  needed (sleep)., Disp: , Rfl:    docusate sodium (COLACE) 100 MG capsule, Take 100 mg by mouth daily as needed for mild constipation or moderate constipation., Disp: , Rfl:    furosemide (LASIX) 20 MG tablet, Take 1 tablet (20 mg total) by mouth daily., Disp: 90 tablet, Rfl: 3   guaiFENesin (ROBITUSSIN) 100 MG/5ML liquid, Take 200 mg by mouth 2 (two) times daily as needed for cough., Disp: , Rfl:    HYDROcodone-acetaminophen (NORCO) 10-325 MG tablet, Take 1 tablet by mouth every 6 (six) hours as needed for severe pain. Please fill on or after 03/16/21, Disp: 120 tablet, Rfl: 0   HYDROcodone-acetaminophen (NORCO) 10-325 MG tablet, Take 1 tablet by mouth every 6 (six) hours as needed for severe pain. Please fill on or after 05/15/21, Disp: 120 tablet, Rfl: 0   HYDROcodone-acetaminophen (NORCO) 10-325 MG tablet, Take 1 tablet by mouth every 6 (six) hours as needed for up to 5 days for severe pain., Disp: 120 tablet, Rfl: 0   hydroxypropyl methylcellulose / hypromellose (ISOPTO TEARS / GONIOVISC) 2.5 % ophthalmic solution, Place 1 drop into both eyes 3 (three) times daily as needed for dry eyes., Disp: , Rfl:    ipratropium-albuterol (DUONEB) 0.5-2.5 (3) MG/3ML SOLN, Take 3 mLs by nebulization every 4 (four) hours as needed., Disp: 360 mL, Rfl: 0   isosorbide mononitrate (IMDUR) 120  MG 24 hr tablet, TAKE 1 TABLET (120 MG TOTAL) BY MOUTH DAILY., Disp: 90 tablet, Rfl: 2   ketoconazole (NIZORAL) 2 % cream, APPLY TO AFFECTED AREA EVERY DAY, Disp: 45 g, Rfl: 1   lactulose (CHRONULAC) 10 GM/15ML solution, , Disp: , Rfl:    levothyroxine (SYNTHROID) 150 MCG tablet, TAKE 1 TABLET BY MOUTH EVERY DAY, Disp: 90 tablet, Rfl: 3   methylPREDNISolone (MEDROL DOSEPAK) 4 MG TBPK tablet, As directed, Disp: 21 tablet, Rfl: 0   naloxegol oxalate (MOVANTIK) 12.5 MG TABS tablet, Take 1 tablet (12.5 mg total) by mouth daily., Disp: 30 tablet, Rfl: 0   naproxen sodium (ALEVE) 220 MG tablet, Take 220 mg by mouth daily as needed.  For Headache, Disp: , Rfl:    nitroGLYCERIN (NITROSTAT) 0.4 MG SL tablet, DISSOLVE ONE TABLET UNDER THE TONGUE EVERY 5 MINUTES AS NEEDED FOR CHEST PAIN.  DO NOT EXCEED A TOTAL OF 3 DOSES IN 15 MINUTES (Patient taking differently: DISSOLVE ONE TABLET UNDER THE TONGUE EVERY 5 MINUTES AS NEEDED FOR CHEST PAIN.  DO NOT EXCEED A TOTAL OF 3 DOSES IN 15 MINUTES), Disp: 25 tablet, Rfl: 6   OXYGEN, Inhale 2.5 L/min into the lungs at bedtime., Disp: , Rfl:    pantoprazole (PROTONIX) 40 MG tablet, Take 1 tablet (40 mg total) by mouth daily before breakfast. (Patient taking differently: Take 40 mg by mouth daily.), Disp: 90 tablet, Rfl: 3   PARoxetine (PAXIL) 40 MG tablet, TAKE 1 TABLET BY MOUTH EVERY DAY, Disp: 90 tablet, Rfl: 3   phenytoin (DILANTIN) 100 MG ER capsule, TAKE 100 MG EVERY MORNING AND 200 MG EVERY NIGHT., Disp: 270 capsule, Rfl: 3   promethazine (PHENERGAN) 25 MG tablet, Take 1 tablet (25 mg total) by mouth every 8 (eight) hours as needed for nausea or vomiting., Disp: 30 tablet, Rfl: 0   Respiratory Therapy Supplies (FLUTTER) DEVI, As directed, Disp: 1 each, Rfl: 0   rosuvastatin (CRESTOR) 20 MG tablet, TAKE 1 TABLET BY MOUTH EVERY DAY, Disp: 90 tablet, Rfl: 3   traZODone (DESYREL) 100 MG tablet, TAKE 1 TABLET BY MOUTH EVERYDAY AT BEDTIME, Disp: 90 tablet, Rfl: 3   Vitamin D, Ergocalciferol, (DRISDOL) 1.25 MG (50000 UNIT) CAPS capsule, TAKE 1 CAPSULE (50,000 UNITS TOTAL) BY MOUTH EVERY MONDAY., Disp: 12 capsule, Rfl: 3   albuterol (PROVENTIL HFA;VENTOLIN HFA) 108 (90 Base) MCG/ACT inhaler, Inhale 2 puffs into the lungs every 6 (six) hours as needed for wheezing or shortness of breath., Disp: 1 Inhaler, Rfl: 5   albuterol (PROVENTIL) (2.5 MG/3ML) 0.083% nebulizer solution, INHALE 1 VIAL BY NEBULIZATION EVERY 4 HOURS AS NEEDED FOR WHEEZING OR SHORTNESS OF BREATH. (Patient not taking: Reported on 03/15/2021), Disp: 150 mL, Rfl: 5   SUMAtriptan (IMITREX) 100 MG tablet, Take 1 tablet (100 mg total) by  mouth once for 1 dose. May repeat in 2 hours if headache persists or recurs., Disp: 9 tablet, Rfl: 3  Allergies:  Allergies  Allergen Reactions   Nitrofuran Derivatives Other (See Comments)    confusion   Oxycodone Hcl Other (See Comments)    Knocked her out for 6 days   Morphine Other (See Comments)    "Makes me go crazy."    Penicillins Hives    Has patient had a PCN reaction causing immediate rash, facial/tongue/throat swelling, SOB or lightheadedness with hypotension: unknown Has patient had a PCN reaction causing severe rash involving mucus membranes or skin necrosis: unknown Has patient had a PCN reaction that required hospitalization No Has  patient had a PCN reaction occurring within the last 10 years: No If all of the above answers are "NO", then may proceed with Cephalosporin use.  Other reaction(s): Rash-Generalized   Tape Other (See Comments)    Skin turns red and burns    Past Medical History, Surgical history, Social history, and Family History were reviewed and updated.  Review of Systems: Review of Systems  Constitutional:  Positive for fatigue.  HENT:  Negative.    Eyes: Negative.   Respiratory:  Positive for cough.   Cardiovascular:  Positive for chest pain.  Gastrointestinal: Negative.   Endocrine: Negative.   Genitourinary: Negative.    Musculoskeletal:  Positive for arthralgias and myalgias.  Skin: Negative.   Neurological: Negative.   Hematological: Negative.   Psychiatric/Behavioral: Negative.     Physical Exam:  weight is 159 lb 12 oz (72.5 kg). Her oral temperature is 98.6 F (37 C). Her blood pressure is 119/32 (abnormal) and her pulse is 77. Her respiration is 18 and oxygen saturation is 95%.   Wt Readings from Last 3 Encounters:  03/15/21 159 lb 12 oz (72.5 kg)  03/10/21 158 lb 12.8 oz (72 kg)  03/08/21 160 lb (72.6 kg)    Physical Exam Vitals reviewed.  HENT:     Head: Normocephalic and atraumatic.  Eyes:     Pupils: Pupils are  equal, round, and reactive to light.  Cardiovascular:     Rate and Rhythm: Normal rate and regular rhythm.     Heart sounds: Normal heart sounds.     Comments: Cardiac exam shows a regular rate and rhythm.  She has no murmurs, rubs or bruits. Pulmonary:     Effort: Pulmonary effort is normal.     Breath sounds: Normal breath sounds.     Comments: Pulmonary exam shows decreased air movement throughout both lung fields.  She has a lot of wheezing.  She has a lot of expiratory wheezing.  Her expiratory phase of breathing is quite long.  She has rhonchi bilaterally.  I do not hear any stridor.   Abdominal:     General: Bowel sounds are normal.     Palpations: Abdomen is soft.  Musculoskeletal:        General: No tenderness or deformity. Normal range of motion.     Cervical back: Normal range of motion.  Lymphadenopathy:     Cervical: No cervical adenopathy.  Skin:    General: Skin is warm and dry.     Findings: No erythema or rash.     Comments: Skin exam shows scattered ecchymoses.  Neurological:     Mental Status: She is alert and oriented to person, place, and time.  Psychiatric:        Behavior: Behavior normal.        Thought Content: Thought content normal.        Judgment: Judgment normal.     Lab Results  Component Value Date   WBC 6.3 03/15/2021   HGB 10.6 (L) 03/15/2021   HCT 35.4 (L) 03/15/2021   MCV 97.5 03/15/2021   PLT 251 03/15/2021     Chemistry      Component Value Date/Time   NA 141 03/15/2021 0943   NA 144 07/08/2018 1059   K 4.2 03/15/2021 0943   CL 100 03/15/2021 0943   CO2 34 (H) 03/15/2021 0943   BUN 17 03/15/2021 0943   BUN 12 07/08/2018 1059   CREATININE 0.89 03/15/2021 0943      Component Value  Date/Time   CALCIUM 9.3 03/15/2021 0943   ALKPHOS 118 03/15/2021 0943   AST 18 03/15/2021 0943   ALT 13 03/15/2021 0943   BILITOT 0.2 (L) 03/15/2021 0943       Impression and Plan: Ms. Detzel is a 65 year old white female.  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  I am glad the anemia is better.  We will see what her iron levels are.  We may have to give another dose of iron.  Will be interesting to see what the cardiologist says about her heart.  It sounds like she may need to have a cardiac cath.  She also has a aortic valve problem.  It sounds like she may have a tight aortic valve.  Again I am not sure what can be done for this given her poor COPD.  I just hate the fact that her quality of life is not that good right now.  Again this is being dictated by her heart and lungs.  I will plan to see her back in another 6 weeks.   [name], MD 9/20/202210:59 AM

## 2021-03-15 NOTE — Telephone Encounter (Signed)
Appts made and printed for pt per 03/15/21 los

## 2021-03-16 ENCOUNTER — Encounter: Payer: Self-pay | Admitting: Family

## 2021-03-16 ENCOUNTER — Telehealth: Payer: Self-pay | Admitting: *Deleted

## 2021-03-16 NOTE — Telephone Encounter (Signed)
Made second attempt to contact pt and LMTCB.

## 2021-03-16 NOTE — Telephone Encounter (Signed)
Per staff message from [facility] 03/16/21 - called and lvm to get scheduled for (3) doses of IV Iron - requested callback to get appointments scheduled.

## 2021-03-16 NOTE — Telephone Encounter (Signed)
Called and informed patient of lab results, patient verbalized understanding and denies any questions or concerns at this time. Message sent to scheduling to call for apt. ? ?

## 2021-03-19 DIAGNOSIS — C3490 Malignant neoplasm of unspecified part of unspecified bronchus or lung: Secondary | ICD-10-CM | POA: Diagnosis not present

## 2021-03-22 ENCOUNTER — Ambulatory Visit: Payer: Medicare HMO | Admitting: Cardiology

## 2021-03-22 ENCOUNTER — Encounter: Payer: Self-pay | Admitting: Cardiology

## 2021-03-22 ENCOUNTER — Other Ambulatory Visit: Payer: Self-pay

## 2021-03-22 VITALS — BP 120/38 | HR 76 | Ht 66.0 in | Wt 156.0 lb

## 2021-03-22 DIAGNOSIS — I25119 Atherosclerotic heart disease of native coronary artery with unspecified angina pectoris: Secondary | ICD-10-CM | POA: Diagnosis not present

## 2021-03-22 DIAGNOSIS — Z955 Presence of coronary angioplasty implant and graft: Secondary | ICD-10-CM | POA: Diagnosis not present

## 2021-03-22 DIAGNOSIS — R9439 Abnormal result of other cardiovascular function study: Secondary | ICD-10-CM

## 2021-03-22 DIAGNOSIS — R0782 Intercostal pain: Secondary | ICD-10-CM

## 2021-03-22 DIAGNOSIS — I10 Essential (primary) hypertension: Secondary | ICD-10-CM

## 2021-03-22 DIAGNOSIS — Z720 Tobacco use: Secondary | ICD-10-CM

## 2021-03-22 DIAGNOSIS — E785 Hyperlipidemia, unspecified: Secondary | ICD-10-CM | POA: Diagnosis not present

## 2021-03-22 NOTE — H&P (View-Only) (Signed)
Primary Care Provider: Cassandria Anger, MD Cardiologist: [name], MD Electrophysiologist: None  Clinic Note: Chief Complaint  Patient presents with   Chest Pain    Sharp chest pain and pressure chest pain -> Myoview follow-up   Shortness of Breath    Per report worse.   2 Week Follow-up    Stress test results     ===================================  ASSESSMENT/PLAN   Problem List Items Addressed This Visit       Cardiology Problems   Coronary artery disease involving native coronary artery of native heart with angina pectoris ([place]) - Primary (Chronic)    Very atypical sounding symptoms for possible angina, but with the extent of discomfort she is having, I think we need to answer the question now that her stress test has been read as high risk.  My personal review of the stress test is not that it is high risk I do not see a defect.  However 2 separate noninvasive cardiologists have reviewed the study and agree that there is an abnormality.  With her significant worsening dyspnea and now this chest pain which may or may not be anginal in nature, I discussed with her different options including simply doing medical management and only going to the Cath Lab if symptoms would not resolve versus going to the Cath Lab first to identify what exactly the stress test is indicating and to assess LVEDP as well as right heart pressures.  After a very prolonged conversation during which she was very Noncommittal as was her husband, we finally decided To Proceed with Right and Left Heart Catheterization.  The intention would be to evaluate for pulmonary hypertension, and also elevated PCWP/LVEDP measurements in setting of moderate-severe aortic insufficiency..  Plan: We will schedule for right and left heart catheterization next week (see shared decision consent note below) She is on Plavix, statin, Imdur and diltiazem stable doses.  No beta-blocker because of severe COPD, and we  have been reluctant to use ARB in the past because of prior issues with hypotension   Shared Decision Making/Informed Consent The risks [stroke (1 in 1000), death (1 in 1000), kidney failure [usually temporary] (1 in 500), bleeding (1 in 200), allergic reaction [possibly serious] (1 in 200)], benefits (diagnostic support and management of coronary artery disease) and alternatives of a right and left heart catheterization with possible percutaneous coronary intervention were discussed in detail with Kaylee Hansen and she is willing to proceed.           Relevant Orders   EKG 12-Lead (Completed)   LEFT AND RIGHT HEART CATHETERIZATION WITH CORONARY ANGIOGRAM   Hyperlipidemia with target LDL less than 70 (Chronic)    Has not been tolerant of statins very much, but is on moderate dose rosuvastatin. Most recent lipid panel in January was pretty well controlled.  We can probably check lipids when she comes in for cath.  Continue current dose of rosuvastatin      Essential hypertension, benign (Chronic)    On high-dose diltiazem along with furosemide.  Pressures look good.  We have been reluctant to use ARB/ACE inhibitor in the past because of borderline renal issues and borderline hypotension.        Other   Chest pain (Chronic)   Relevant Orders   EKG 12-Lead (Completed)   LEFT AND RIGHT HEART CATHETERIZATION WITH CORONARY ANGIOGRAM   Tobacco abuse disorder (Chronic)    Still working on trying to quit, but not yet ready to discuss.  Presence of drug coated stent in right coronary artery (Chronic)    On maintenance dose Plavix.  With upcoming cardiac catheterization, would not hold until cath performed.    If no additional PCI required, then would be able to restart. Okay to hold Plavix 5 day preop for surgery procedures.      Abnormal nuclear stress test    Very unusual.  She has very atypical sounding chest pain symptoms, and her Myoview was read as abnormal, high risk however  I do not see significant reversible defects.  Given the equivocal nature of the testing and her ongoing symptoms, I think we are obligated now to consider proceeding to the cardiac catheterization lab.  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  Will be scheduled for Friday, October 7.      Relevant Orders   EKG 12-Lead (Completed)   LEFT AND RIGHT HEART CATHETERIZATION WITH CORONARY ANGIOGRAM    ===================================  HPI:    Kaylee Hansen is a 65 y.o. female with a very complicated PMH but notably CAD-PCI to RCA in January 2020 for non-STEMI Along with Valvular Heart Disease who presents today for close follow-up 2 to 3 weeks and after stress.  PMH includes severe Gold IV COPD, history of Recurrent Small Cell Lung Cancer, CVA, mild AS with Severe AI (Type III with holodiastolic flow reversal and descending aorta vena contract of 6 mm), complicated by Severe Orthopedic Conditions (Left Hip, Knee -> unable to walk more than 2-3 steps without significant pain) ->  Mostly wheelchair-bound.  Kaylee Hansen was last seen by me on March 24 -> remained in the wheelchair, on baseline oxygen.  She was not necessarily complaining of any chest pain concerning for angina, only noting intermittent sharp pains with inspiration that can last anywhere from 10 to 16minutes.  Not consistent with MI pain.  This is in addition to her chronic productive cough baseline dyspnea and wheezing.  She had abdominal pain and constipation diffuse joint pains with radicular pain from back pain. Plan was to increase from diltiazem 240 mg to diltiazem 360 mg. Also refilled PRN Lasix.  --> Triage call on June 30 with complaints of chest pain for the last 3 days in the center of her chest radiating to the left side.  Nothing alleviated.  Noted to be audibly dyspneic on the phone.  Recommended ER  evaluation.  They did not go.  Recent Hospitalizations:  01/26/2021: ER for COPD exacerbation-refused admission.  --> Triage call on August 12 also complaining of chest pain for which she took sublingual nitroglycerin and increased Imdur.  Again was recommended for her to go to the ER for active ongoing chest pain. -->  She called again on August 31 with similar chest pain symptoms.  Requested appointment.  Indicated that she did not want to go to the emergency room.  We will schedule for work in appointment with Dr. Audie Box on DOD day ->   Seen by Dr. Eleonore Chiquito as a work in visit on 03/01/2021 with complaints of chest pain lasting 10 to 15 minutes.  Sharp and dull mostly left-sided chest pain.  Also noted increased dyspnea and was noted to have significant rhonchi on exam.  Symptoms thought to be musculoskeletal related to COPD exacerbation.  Despite all this, she indicated that her breathing is not much different than baseline. Lexiscan Myoview ordered. Recommended pulmonology follow-up.  Follows  with Dr. Marin Olp for chronic anemia-iron deficiency.  Hemoglobin was most recently 10.6.  He discussed the possibility of Epogen.  Reviewed  CV studies:    The following studies were reviewed today: (if available, images/films reviewed: From Epic Chart or Care Everywhere) Myoview 03/08/2021: As High Risk to defects noted and medium size moderate.  Also small size moderate mid and basal inferolateral wall.  EF 59%.  Felt to be new compared in 2003.   Interval History:   Kaylee Hansen is here today again with her husband.  She is wheelchair-bound.  Seems to be very uncomfortable.  Is noticeably dyspneic, but not that different from her baseline.  She does have a more productive cough however.  She is relatively sedentary and therefore is difficult to assess for exertional dyspnea, but she has symptoms simply Brabham bed.  She has several different types of chest chest pain-1 is a squeezing pain and the  other is a sharp pain.  Episodes can last anywhere from seconds to 10 to 15 minutes.  She says that is associated with additional dyspnea.  She says that the chest pain is worse with deep inspiration and with lots of different movements.  It can be there all day long or can be there for minutes. She denies any rapid irregular heartbeats or palpitations, but does note orthopnea and PND  CV Review of Symptoms (Summary) Cardiovascular ROS: no chest pain or dyspnea on exertion positive for - orthopnea, paroxysmal nocturnal dyspnea, shortness of breath, and profound weakness and fatigue negative for - edema, irregular heartbeat, loss of consciousness, palpitations, or rapid heart rate  REVIEWED OF SYSTEMS   Review of Systems  Constitutional:  Positive for chills and malaise/fatigue. Negative for fever.  HENT:  Positive for congestion. Negative for hearing loss.   Respiratory:  Positive for cough, sputum production, shortness of breath and wheezing. Negative for hemoptysis.   Cardiovascular:  Negative for leg swelling (Minimal).  Gastrointestinal:  Positive for abdominal pain and constipation. Negative for blood in stool and melena.  Genitourinary:  Positive for dysuria and urgency. Negative for frequency and hematuria.  Musculoskeletal:  Positive for back pain and joint pain. Negative for myalgias.  Neurological:  Positive for dizziness and weakness. Negative for focal weakness.  Psychiatric/Behavioral:  Positive for memory loss. The patient is nervous/anxious.    Fatigue, cough, chest pain, arthralgias & myalgias.  I have reviewed and (if needed) personally updated the patient's problem list, medications, allergies, past medical and surgical history, social and family history.   PAST MEDICAL HISTORY   Past Medical History:  Diagnosis Date   Acute respiratory failure following trauma and surgery ([place]) 08/26/2012   Anxiety    Aortic insufficiency with aortic stenosis 04/2017   TTE December  2019: Mild AS with severe regurgitation.  Mild LA dilation.;;  TEE January 2016: Severe-type III aortic regurgitation (holodiastolic flow reversal in the a sending aorta & vena contracta =6.    CAD S/P percutaneous coronary angioplasty 11/2017   Proximal RCA PCI Synergy DES 3.5 mm x 16 mm (3.8 mm). Ost-mLM 30%. Ost-prox Cx 40%.   Collagenous colitis    Colon adenomas 2011   Constipation    Chronic abdominal pain and constipation   COPD (chronic obstructive pulmonary disease) (HCC)    Depression    Diverticulosis    Esophageal stricture 11/08/2012   Ulcer noted in 2010   GERD (gastroesophageal reflux disease)    Helicobacter pylori gastritis 2010   Pylera Tx   History  of cholecystectomy    Hx of appendectomy    Hx of cancer of lung 1999   Hx of hysterectomy    Hyperlipidemia    Hypothyroidism    Iron deficiency anemia due to chronic blood loss 10/01/2019   Low back pain    Non-STEMI (non-ST elevated myocardial infarction) ([place]) 06/2018   RCA PCI   Osteoarthritis of knee    bilateral knee   Seizures (HCC)    Stroke (HCC)    CVA, hx of 97   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[place])     PAST SURGICAL HISTORY   Past Surgical History:  Procedure Laterality Date   ABDOMINAL HYSTERECTOMY     complete 1992   ABDOMINAL SURGERY     Exploratory   APPENDECTOMY     BALLOON DILATION N/A 11/08/2012   Procedure: BALLOON DILATION;  Surgeon: Gatha Mayer, MD;  Location: [facility];  Service: Endoscopy;  Laterality: N/A;   CHOLECYSTECTOMY     COLONOSCOPY W/ BIOPSIES     multiple   CORONARY STENT INTERVENTION N/A 06/28/2018   Procedure: CORONARY STENT INTERVENTION;  Surgeon: Martinique, Peter M, MD;  Location: [place] [facility];  Service: Cardiovascular;  Laterality: N/A;  90% prox RCA -> PCI with Synergy DES 3.5 mm x 16 mm (3.8 mm).   CT CTA CORONARY W/CA SCORE W/CM &/OR WO/CM  05/2017   Coronary calcium score 2.9. Intermediate risk. Unusual noncalcified plaque in RCA --FFR negative    ESOPHAGOGASTRODUODENOSCOPY     w/baloon x 2   ESOPHAGOGASTRODUODENOSCOPY N/A 11/08/2012   Procedure: ESOPHAGOGASTRODUODENOSCOPY (EGD);  Surgeon: Gatha Mayer, MD;  Location: [facility];  Service: Endoscopy;  Laterality: N/A;   ESOPHAGOGASTRODUODENOSCOPY (EGD) WITH PROPOFOL N/A 08/25/2019   Procedure: ESOPHAGOGASTRODUODENOSCOPY (EGD) WITH PROPOFOL;  Surgeon: Gatha Mayer, MD;  Location: [facility];  Service: Endoscopy;  Laterality: N/A;   LEFT HEART CATH AND CORONARY ANGIOGRAPHY N/A 06/28/2018   Procedure: LEFT HEART CATH AND CORONARY ANGIOGRAPHY;  Surgeon: Martinique, Peter M, MD;  Location: [facility];  Service: Cardiovascular:  90% prox RCA -> DES PCI. Ost-mLM 30%. Ost-prox Cx 40%.   EF 55-65%. LVEDP 15 mmHg.    MALONEY DILATION  08/25/2019   Procedure: MALONEY DILATION;  Surgeon: Gatha Mayer, MD;  Location: [facility];  Service: Endoscopy;;   RIGHT HEART CATH N/A 07/11/2018   Procedure: RIGHT HEART CATH;  Surgeon: Leonie Man, MD;  Location: [place];;   Relatively normal Right Heart Cath pressures: PA pressure 26/14 mmHg-mean 20 mmHg; PCWP 13 mmHg.  RAP 6 mmHg, RVP 28/3 mmHg-EDP 10 mmHg. Severely reduced cardiac output and index by Fick: 3.63 and 2.07.   TEE WITHOUT CARDIOVERSION N/A 07/11/2018   Procedure: TRANSESOPHAGEAL ECHOCARDIOGRAM (TEE);  Surgeon: [name], MD;  Location: [facility]OSCOPY;  Service: Cardiology;;  Severe aortic regurgitation-type III. (suggested by holodiastolic flow reversal and descending aorta-vena contracta 6 mm).   TOTAL HIP ARTHROPLASTY     Left   TRANSTHORACIC ECHOCARDIOGRAM  05/2018   EF 60-65%.  No R WMA.  GR 1 DD.  Mild aortic stenosis with severe regurgitation.  Mild MR..  Only mild LV dilation noted.   TRANSTHORACIC ECHOCARDIOGRAM  12/2018    EF 60-65%. Mild LVH. Gr1 DD. Mod AoV thickening. Mod-Severe AI, ~ mlid AS.   RCA PCI January 2020   Immunization History  Administered Date(s) Administered   Fluad Quad(high Dose  65+) 04/03/2019, 04/29/2020, 03/10/2021   Influenza Split 03/27/2012   Influenza Whole 05/08/2007, 04/08/2009,  03/24/2011   Influenza, High Dose Seasonal PF 04/04/2017   Influenza,inj,Quad PF,6+ Mos 03/05/2013, 05/01/2014, 03/05/2015, 04/17/2016, 03/13/2018   PFIZER(Purple Top)SARS-COV-2 Vaccination 08/19/2019, 09/09/2019, 05/17/2020   Pneumococcal Conjugate-13 07/04/2016   Pneumococcal Polysaccharide-23 07/14/2015   Td 04/13/2016   Tdap 07/14/2015    MEDICATIONS/ALLERGIES   Current Meds  Medication Sig   ALPRAZolam (XANAX) 0.25 MG tablet Take 1 tablet (0.25 mg total) by mouth at bedtime as needed for anxiety.   bisacodyl (DULCOLAX) 5 MG EC tablet Take 2 tablets (10 mg total) by mouth at bedtime. (Patient taking differently: Take 10 mg by mouth daily as needed.)   calcium carbonate (TUMS - DOSED IN MG ELEMENTAL CALCIUM) 500 MG chewable tablet Chew 1 tablet by mouth daily as needed for indigestion or heartburn.   clopidogrel (PLAVIX) 75 MG tablet TAKE 1 TABLET (75 MG TOTAL) BY MOUTH DAILY WITH BREAKFAST.   diltiazem (CARDIZEM CD) 360 MG 24 hr capsule Take 1 capsule (360 mg total) by mouth daily.   diphenhydrAMINE HCl, Sleep, (ZZZQUIL) 25 MG CAPS Take 50 mg by mouth at bedtime as needed (sleep).   docusate sodium (COLACE) 100 MG capsule Take 100 mg by mouth daily as needed for mild constipation or moderate constipation.   furosemide (LASIX) 20 MG tablet Take 1 tablet (20 mg total) by mouth daily.   guaiFENesin (ROBITUSSIN) 100 MG/5ML liquid Take 200 mg by mouth 2 (two) times daily as needed for cough.   HYDROcodone-acetaminophen (NORCO) 10-325 MG tablet Take 1 tablet by mouth every 6 (six) hours as needed for severe pain. Please fill on or after 03/16/21   HYDROcodone-acetaminophen (NORCO) 10-325 MG tablet Take 1 tablet by mouth every 6 (six) hours as needed for severe pain. Please fill on or after 05/15/21   hydroxypropyl methylcellulose / hypromellose (ISOPTO TEARS / GONIOVISC) 2.5 %  ophthalmic solution Place 1 drop into both eyes 3 (three) times daily as needed for dry eyes.   ipratropium-albuterol (DUONEB) 0.5-2.5 (3) MG/3ML SOLN Take 3 mLs by nebulization every 4 (four) hours as needed.   isosorbide mononitrate (IMDUR) 120 MG 24 hr tablet TAKE 1 TABLET (120 MG TOTAL) BY MOUTH DAILY.   ketoconazole (NIZORAL) 2 % cream APPLY TO AFFECTED AREA EVERY DAY   lactulose (CHRONULAC) 10 GM/15ML solution    levothyroxine (SYNTHROID) 150 MCG tablet TAKE 1 TABLET BY MOUTH EVERY DAY   methylPREDNISolone (MEDROL DOSEPAK) 4 MG TBPK tablet As directed   naloxegol oxalate (MOVANTIK) 12.5 MG TABS tablet Take 1 tablet (12.5 mg total) by mouth daily.   naproxen sodium (ALEVE) 220 MG tablet Take 220 mg by mouth daily as needed. For Headache   nitroGLYCERIN (NITROSTAT) 0.4 MG SL tablet DISSOLVE ONE TABLET UNDER THE TONGUE EVERY 5 MINUTES AS NEEDED FOR CHEST PAIN.  DO NOT EXCEED A TOTAL OF 3 DOSES IN 15 MINUTES (Patient taking differently: DISSOLVE ONE TABLET UNDER THE TONGUE EVERY 5 MINUTES AS NEEDED FOR CHEST PAIN.  DO NOT EXCEED A TOTAL OF 3 DOSES IN 15 MINUTES)   OXYGEN Inhale 2.5 L/min into the lungs at bedtime.   pantoprazole (PROTONIX) 40 MG tablet Take 1 tablet (40 mg total) by mouth daily before breakfast. (Patient taking differently: Take 40 mg by mouth daily.)   PARoxetine (PAXIL) 40 MG tablet TAKE 1 TABLET BY MOUTH EVERY DAY   phenytoin (DILANTIN) 100 MG ER capsule TAKE 100 MG EVERY MORNING AND 200 MG EVERY NIGHT.   promethazine (PHENERGAN) 25 MG tablet Take 1 tablet (25 mg total) by mouth every  8 (eight) hours as needed for nausea or vomiting.   Respiratory Therapy Supplies (FLUTTER) DEVI As directed   rosuvastatin (CRESTOR) 20 MG tablet TAKE 1 TABLET BY MOUTH EVERY DAY   traZODone (DESYREL) 100 MG tablet TAKE 1 TABLET BY MOUTH EVERYDAY AT BEDTIME   Vitamin D, Ergocalciferol, (DRISDOL) 1.25 MG (50000 UNIT) CAPS capsule TAKE 1 CAPSULE (50,000 UNITS TOTAL) BY MOUTH EVERY MONDAY.     Allergies  Allergen Reactions   Nitrofuran Derivatives Other (See Comments)    confusion   Oxycodone Hcl Other (See Comments)    Knocked her out for 6 days   Morphine Other (See Comments)    "Makes me go crazy."    Penicillins Hives    Has patient had a PCN reaction causing immediate rash, facial/tongue/throat swelling, SOB or lightheadedness with hypotension: unknown Has patient had a PCN reaction causing severe rash involving mucus membranes or skin necrosis: unknown Has patient had a PCN reaction that required hospitalization No Has patient had a PCN reaction occurring within the last 10 years: No If all of the above answers are "NO", then may proceed with Cephalosporin use.  Other reaction(s): Rash-Generalized   Tape Other (See Comments)    Skin turns red and burns    SOCIAL HISTORY/FAMILY HISTORY   Reviewed in Epic:  Pertinent findings:  Social History   Tobacco Use   Smoking status: Every Day    Packs/day: 0.50    Years: 48.00    Pack years: 24.00    Types: Cigarettes   Smokeless tobacco: Never   Tobacco comments:    09/21/20 8cigs/day.  Vaping Use   Vaping Use: Never used  Substance Use Topics   Alcohol use: No   Drug use: No   Social History   Social History Narrative   Married   2 children   No regular exercise          OBJCTIVE -PE, EKG, labs   Wt Readings from Last 3 Encounters:  03/22/21 156 lb (70.8 kg)  03/15/21 159 lb 12 oz (72.5 kg)  03/10/21 158 lb 12.8 oz (72 kg)    Physical Exam: BP (!) 120/38 (BP Location: Right Arm)   Pulse 76   Ht 5\' 6"  (1.676 m)   Wt 156 lb (70.8 kg)   SpO2 94%   BMI 25.18 kg/m  Physical Exam Vitals reviewed.  Constitutional:      General: She is not in acute distress (Moderate chronic distress, not truly lethargic, but borderline).    Appearance: She is ill-appearing. She is not toxic-appearing.     Interventions: Nasal cannula in place.  HENT:     Head: Atraumatic.     Comments: Seems somewhat  puffier-likely response to steroids. Neck:     Vascular: Carotid bruit (Bilateral), hepatojugular reflux and JVD present.  Cardiovascular:     Rate and Rhythm: Normal rate and regular rhythm. Occasional Extrasystoles are present.    Chest Wall: PMI is not displaced.     Pulses: Decreased pulses.          Carotid pulses are 1+ on the right side and 1+ on the left side.      Radial pulses are 0 on the right side and 0 on the left side.     Heart sounds: S1 normal and S2 normal. Heart sounds are distant. Murmur heard.  High-pitched harsh midsystolic murmur is present with a grade of 2/6 at the upper right sternal border radiating to the neck.  High-pitched blowing  decrescendo early diastolic murmur is present with a grade of 3/4 at the upper right sternal border radiating to the apex.    No friction rub. No gallop.     Comments: Normal S1 with faint/split S2 Pulmonary:     Effort: Accessory muscle usage, prolonged expiration and respiratory distress (Always had mild baseline distress) present.     Breath sounds: Wheezing and rhonchi (Diffuse coarse rhonchi with late expiratory wheeze.) present. No decreased breath sounds.  Abdominal:     General: Abdomen is flat. Bowel sounds are normal. There is no distension.     Palpations: Abdomen is soft. There is no mass.     Tenderness: There is no abdominal tenderness.  Musculoskeletal:        General: Swelling (Trivial ankle swelling) present.     Cervical back: Normal range of motion and neck supple.  Skin:    General: Skin is warm and dry.     Comments: Diffuse brownish discoloration in the face and lower legs.  Neurological:     Mental Status: She is oriented to person, place, and time.     Motor: Weakness (Wheelchair-bound.  Legs are profoundly weak) present.  Psychiatric:        Mood and Affect: Mood normal.        Behavior: Behavior is cooperative.        Thought Content: Thought content normal.     Comments: Very slow to respond to  questions.  Flat affect.  Relatively noncommittal, and difficult to obtain cardiac story.     Adult ECG Report  Rate: 77;  Rhythm: Incomplete RBBB-LVH with RSR' Pattern suggestive of RV conduction delay.;   Narrative Interpretation: Relatively stable EKG from before, however not normal.  Recent Labs: Stable labs Lab Results  Component Value Date   CHOL 128 06/29/2018   HDL 35 (L) 06/29/2018   LDLCALC 56 06/29/2018   LDLDIRECT 187.0 07/14/2015   TRIG 184 (H) 06/29/2018   CHOLHDL 3.7 06/29/2018   Lab Results  Component Value Date   CREATININE 0.89 03/15/2021   BUN 17 03/15/2021   NA 141 03/15/2021   K 4.2 03/15/2021   CL 100 03/15/2021   CO2 34 (H) 03/15/2021   CBC Latest Ref Rng & Units 03/15/2021 01/26/2021 01/26/2021  WBC 4.0 - 10.5 K/uL 6.3 5.8 5.9  Hemoglobin 12.0 - 15.0 g/dL 10.6(L) 9.2(L) 9.3(L)  Hematocrit 36.0 - 46.0 % 35.4(L) 29.3(L) 30.1(L)  Platelets 150 - 400 K/uL 251 250 265    Lab Results  Component Value Date   HGBA1C  09/17/2008    5.0 (NOTE)   The ADA recommends the following therapeutic goal for glycemic   control related to Hgb A1C measurement:   Goal of Therapy:   < 7.0% Hgb A1C   Reference: American Diabetes Association: Clinical Practice   Recommendations 2008, Diabetes Care,  2008, 31:(Suppl 1).   Lab Results  Component Value Date   TSH 1.84 01/01/2019    ==================================================  COVID-19 Education: The signs and symptoms of COVID-19 were discussed with the patient and how to seek care for testing (follow up with PCP or arrange E-visit).    I spent a total of 61 minutes with the patient spent in direct patient consultation.  Additional time spent with chart review  / charting (studies, outside notes, etc): 25 min Total Time: 81 min  Current medicines are reviewed at length with the patient today.  (+/- concerns) multiple concerns noted above.  This visit occurred during the  SARS-CoV-2 public health emergency.   Safety protocols were in place, including screening questions prior to the visit, additional usage of staff PPE, and extensive cleaning of exam room while observing appropriate contact time as indicated for disinfecting solutions.  Notice: This dictation was prepared with Dragon dictation along with smart phrase technology. Any transcriptional errors that result from this process are unintentional and may not be corrected upon review.  Patient Instructions / Medication Changes & Studies & Tests Ordered   Patient Instructions  Medication Instructions:  No changes  *If you need a refill on your cardiac medications before your next appointment, please call your pharmacy*   Lab Work: Not needed    Testing/Procedures:  Will be schedule at [facility] has requested that you have a Right and Left cardiac catheterization. Cardiac catheterization is used to diagnose and/or treat various heart conditions. Doctors may recommend this procedure for a number of different reasons. The most common reason is to evaluate chest pain. Chest pain can be a symptom of coronary artery disease (CAD), and cardiac catheterization can show whether plaque is narrowing or blocking your heart's arteries. This procedure is also used to evaluate the valves, as well as measure the blood flow and oxygen levels in different parts of your heart. Please follow instruction sheet, as given.   Follow-Up: At [facility], you and your health needs are our priority.  As part of our continuing mission to provide you with exceptional heart care, we have created designated Provider Care Teams.  These Care Teams include your primary Cardiologist (physician) and Advanced Practice Providers (APPs -  Physician Assistants and Nurse Practitioners) who all work together to provide you with the care you need, when you need it.     Your next appointment:          3 to 4 weeks   The format for your  next appointment:   In Person  Provider:   [name], MD   Other Instructions   [place] [address] Dept: [phone] Loc: [place]  03/22/2021  You are scheduled for a  Right and Left Cardiac Catheterization on Friday, October 7 with Dr. [name].  1. Please arrive at the [facility] (Main Entrance A) at [facility]: [address] [place], [place] [address] at 8:30 AM (This time is two hours before your procedure to ensure your preparation). Free valet parking service is available.   Special note: Every effort is made to have your procedure done on time. Please understand that emergencies sometimes delay scheduled procedures.  2. Diet: Do not eat solid foods after midnight.  The patient may have clear liquids until 5am upon the day of the procedure.  3. Labs:  no labs are needed. You labwork done 03/15/21  4. Medication instructions in preparation for your procedure:   Contrast Allergy: No   Do not take Furosemide the morning of procedure   On the morning of your procedure, take your Aspirin and Plavix/Clopidogrel and any morning medicines NOT listed above.  You may use sips of water.  5. Plan for one night stay--bring personal belongings. 6. Bring a current list of your medications and current insurance cards. 7. You MUST have a responsible person to drive you home. 8. Someone MUST be with you the first 24 hours after you arrive home or your discharge  will be delayed. 9. Please wear clothes that are easy to get on and off and wear slip-on shoes.  Thank you for allowing [place] to care for you!   --  Invasive Cardiovascular services    Studies Ordered:   Orders Placed This Encounter  Procedures   EKG 12-Lead   LEFT AND RIGHT HEART CATHETERIZATION WITH CORONARY Illene Silver, M.D.,  M.S. Interventional Cardiologist   Pager # [phone] Phone # [phone] [address] [place], [place] [address]   Thank you for choosing Heartcare at [facility]!!

## 2021-03-22 NOTE — Progress Notes (Signed)
Primary Care Provider: Cassandria Anger, MD Cardiologist: [name], MD Electrophysiologist: None  Clinic Note: Chief Complaint  Patient presents with   Chest Pain    Sharp chest pain and pressure chest pain -> Myoview follow-up   Shortness of Breath    Per report worse.   2 Week Follow-up    Stress test results     ===================================  ASSESSMENT/PLAN   Problem List Items Addressed This Visit       Cardiology Problems   Coronary artery disease involving native coronary artery of native heart with angina pectoris ([place]) - Primary (Chronic)    Very atypical sounding symptoms for possible angina, but with the extent of discomfort she is having, I think we need to answer the question now that her stress test has been read as high risk.  My personal review of the stress test is not that it is high risk I do not see a defect.  However 2 separate noninvasive cardiologists have reviewed the study and agree that there is an abnormality.  With her significant worsening dyspnea and now this chest pain which may or may not be anginal in nature, I discussed with her different options including simply doing medical management and only going to the Cath Lab if symptoms would not resolve versus going to the Cath Lab first to identify what exactly the stress test is indicating and to assess LVEDP as well as right heart pressures.  After a very prolonged conversation during which she was very Noncommittal as was her husband, we finally decided To Proceed with Right and Left Heart Catheterization.  The intention would be to evaluate for pulmonary hypertension, and also elevated PCWP/LVEDP measurements in setting of moderate-severe aortic insufficiency..  Plan: We will schedule for right and left heart catheterization next week (see shared decision consent note below) She is on Plavix, statin, Imdur and diltiazem stable doses.  No beta-blocker because of severe COPD, and we  have been reluctant to use ARB in the past because of prior issues with hypotension   Shared Decision Making/Informed Consent The risks [stroke (1 in 1000), death (1 in 1000), kidney failure [usually temporary] (1 in 500), bleeding (1 in 200), allergic reaction [possibly serious] (1 in 200)], benefits (diagnostic support and management of coronary artery disease) and alternatives of a right and left heart catheterization with possible percutaneous coronary intervention were discussed in detail with Ms. Kareem and she is willing to proceed.           Relevant Orders   EKG 12-Lead (Completed)   LEFT AND RIGHT HEART CATHETERIZATION WITH CORONARY ANGIOGRAM   Hyperlipidemia with target LDL less than 70 (Chronic)    Has not been tolerant of statins very much, but is on moderate dose rosuvastatin. Most recent lipid panel in January was pretty well controlled.  We can probably check lipids when she comes in for cath.  Continue current dose of rosuvastatin      Essential hypertension, benign (Chronic)    On high-dose diltiazem along with furosemide.  Pressures look good.  We have been reluctant to use ARB/ACE inhibitor in the past because of borderline renal issues and borderline hypotension.        Other   Chest pain (Chronic)   Relevant Orders   EKG 12-Lead (Completed)   LEFT AND RIGHT HEART CATHETERIZATION WITH CORONARY ANGIOGRAM   Tobacco abuse disorder (Chronic)    Still working on trying to quit, but not yet ready to discuss.  Presence of drug coated stent in right coronary artery (Chronic)    On maintenance dose Plavix.  With upcoming cardiac catheterization, would not hold until cath performed.    If no additional PCI required, then would be able to restart. Okay to hold Plavix 5 day preop for surgery procedures.      Abnormal nuclear stress test    Very unusual.  She has very atypical sounding chest pain symptoms, and her Myoview was read as abnormal, high risk however  I do not see significant reversible defects.  Given the equivocal nature of the testing and her ongoing symptoms, I think we are obligated now to consider proceeding to the cardiac catheterization lab.  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  Will be scheduled for Friday, October 7.      Relevant Orders   EKG 12-Lead (Completed)   LEFT AND RIGHT HEART CATHETERIZATION WITH CORONARY ANGIOGRAM    ===================================  HPI:    Kaylee Hansen is a 65 y.o. female with a very complicated PMH but notably CAD-PCI to RCA in January 2020 for non-STEMI Along with Valvular Heart Disease who presents today for close follow-up 2 to 3 weeks and after stress.  PMH includes severe Gold IV COPD, history of Recurrent Small Cell Lung Cancer, CVA, mild AS with Severe AI (Type III with holodiastolic flow reversal and descending aorta vena contract of 6 mm), complicated by Severe Orthopedic Conditions (Left Hip, Knee -> unable to walk more than 2-3 steps without significant pain) ->  Mostly wheelchair-bound.  Forrest L Kent was last seen by me on March 24 -> remained in the wheelchair, on baseline oxygen.  She was not necessarily complaining of any chest pain concerning for angina, only noting intermittent sharp pains with inspiration that can last anywhere from 10 to 11minutes.  Not consistent with MI pain.  This is in addition to her chronic productive cough baseline dyspnea and wheezing.  She had abdominal pain and constipation diffuse joint pains with radicular pain from back pain. Plan was to increase from diltiazem 240 mg to diltiazem 360 mg. Also refilled PRN Lasix.  --> Triage call on June 30 with complaints of chest pain for the last 3 days in the center of her chest radiating to the left side.  Nothing alleviated.  Noted to be audibly dyspneic on the phone.  Recommended ER  evaluation.  They did not go.  Recent Hospitalizations:  01/26/2021: ER for COPD exacerbation-refused admission.  --> Triage call on August 12 also complaining of chest pain for which she took sublingual nitroglycerin and increased Imdur.  Again was recommended for her to go to the ER for active ongoing chest pain. -->  She called again on August 31 with similar chest pain symptoms.  Requested appointment.  Indicated that she did not want to go to the emergency room.  We will schedule for work in appointment with Dr. Audie Box on DOD day ->   Seen by Dr. Eleonore Chiquito as a work in visit on 03/01/2021 with complaints of chest pain lasting 10 to 15 minutes.  Sharp and dull mostly left-sided chest pain.  Also noted increased dyspnea and was noted to have significant rhonchi on exam.  Symptoms thought to be musculoskeletal related to COPD exacerbation.  Despite all this, she indicated that her breathing is not much different than baseline. Lexiscan Myoview ordered. Recommended pulmonology follow-up.  Follows  with Dr. Marin Olp for chronic anemia-iron deficiency.  Hemoglobin was most recently 10.6.  He discussed the possibility of Epogen.  Reviewed  CV studies:    The following studies were reviewed today: (if available, images/films reviewed: From Epic Chart or Care Everywhere) Myoview 03/08/2021: As High Risk to defects noted and medium size moderate.  Also small size moderate mid and basal inferolateral wall.  EF 59%.  Felt to be new compared in 2003.   Interval History:   TIAUNA WHISNANT is here today again with her husband.  She is wheelchair-bound.  Seems to be very uncomfortable.  Is noticeably dyspneic, but not that different from her baseline.  She does have a more productive cough however.  She is relatively sedentary and therefore is difficult to assess for exertional dyspnea, but she has symptoms simply Brabham bed.  She has several different types of chest chest pain-1 is a squeezing pain and the  other is a sharp pain.  Episodes can last anywhere from seconds to 10 to 15 minutes.  She says that is associated with additional dyspnea.  She says that the chest pain is worse with deep inspiration and with lots of different movements.  It can be there all day long or can be there for minutes. She denies any rapid irregular heartbeats or palpitations, but does note orthopnea and PND  CV Review of Symptoms (Summary) Cardiovascular ROS: no chest pain or dyspnea on exertion positive for - orthopnea, paroxysmal nocturnal dyspnea, shortness of breath, and profound weakness and fatigue negative for - edema, irregular heartbeat, loss of consciousness, palpitations, or rapid heart rate  REVIEWED OF SYSTEMS   Review of Systems  Constitutional:  Positive for chills and malaise/fatigue. Negative for fever.  HENT:  Positive for congestion. Negative for hearing loss.   Respiratory:  Positive for cough, sputum production, shortness of breath and wheezing. Negative for hemoptysis.   Cardiovascular:  Negative for leg swelling (Minimal).  Gastrointestinal:  Positive for abdominal pain and constipation. Negative for blood in stool and melena.  Genitourinary:  Positive for dysuria and urgency. Negative for frequency and hematuria.  Musculoskeletal:  Positive for back pain and joint pain. Negative for myalgias.  Neurological:  Positive for dizziness and weakness. Negative for focal weakness.  Psychiatric/Behavioral:  Positive for memory loss. The patient is nervous/anxious.    Fatigue, cough, chest pain, arthralgias & myalgias.  I have reviewed and (if needed) personally updated the patient's problem list, medications, allergies, past medical and surgical history, social and family history.   PAST MEDICAL HISTORY   Past Medical History:  Diagnosis Date   Acute respiratory failure following trauma and surgery ([place]) 08/26/2012   Anxiety    Aortic insufficiency with aortic stenosis 04/2017   TTE December  2019: Mild AS with severe regurgitation.  Mild LA dilation.;;  TEE January 2016: Severe-type III aortic regurgitation (holodiastolic flow reversal in the a sending aorta & vena contracta =6.    CAD S/P percutaneous coronary angioplasty 11/2017   Proximal RCA PCI Synergy DES 3.5 mm x 16 mm (3.8 mm). Ost-mLM 30%. Ost-prox Cx 40%.   Collagenous colitis    Colon adenomas 2011   Constipation    Chronic abdominal pain and constipation   COPD (chronic obstructive pulmonary disease) (HCC)    Depression    Diverticulosis    Esophageal stricture 11/08/2012   Ulcer noted in 2010   GERD (gastroesophageal reflux disease)    Helicobacter pylori gastritis 2010   Pylera Tx   History  of cholecystectomy    Hx of appendectomy    Hx of cancer of lung 1999   Hx of hysterectomy    Hyperlipidemia    Hypothyroidism    Iron deficiency anemia due to chronic blood loss 10/01/2019   Low back pain    Non-STEMI (non-ST elevated myocardial infarction) ([place]) 06/2018   RCA PCI   Osteoarthritis of knee    bilateral knee   Seizures (HCC)    Stroke (HCC)    CVA, hx of 97   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[place])     PAST SURGICAL HISTORY   Past Surgical History:  Procedure Laterality Date   ABDOMINAL HYSTERECTOMY     complete 1992   ABDOMINAL SURGERY     Exploratory   APPENDECTOMY     BALLOON DILATION N/A 11/08/2012   Procedure: BALLOON DILATION;  Surgeon: Gatha Mayer, MD;  Location: [facility];  Service: Endoscopy;  Laterality: N/A;   CHOLECYSTECTOMY     COLONOSCOPY W/ BIOPSIES     multiple   CORONARY STENT INTERVENTION N/A 06/28/2018   Procedure: CORONARY STENT INTERVENTION;  Surgeon: Martinique, Peter M, MD;  Location: [facility];  Service: Cardiovascular;  Laterality: N/A;  90% prox RCA -> PCI with Synergy DES 3.5 mm x 16 mm (3.8 mm).   CT CTA CORONARY W/CA SCORE W/CM &/OR WO/CM  05/2017   Coronary calcium score 2.9. Intermediate risk. Unusual noncalcified plaque in RCA --FFR negative    ESOPHAGOGASTRODUODENOSCOPY     w/baloon x 2   ESOPHAGOGASTRODUODENOSCOPY N/A 11/08/2012   Procedure: ESOPHAGOGASTRODUODENOSCOPY (EGD);  Surgeon: Gatha Mayer, MD;  Location: [facility];  Service: Endoscopy;  Laterality: N/A;   ESOPHAGOGASTRODUODENOSCOPY (EGD) WITH PROPOFOL N/A 08/25/2019   Procedure: ESOPHAGOGASTRODUODENOSCOPY (EGD) WITH PROPOFOL;  Surgeon: Gatha Mayer, MD;  Location: [facility];  Service: Endoscopy;  Laterality: N/A;   LEFT HEART CATH AND CORONARY ANGIOGRAPHY N/A 06/28/2018   Procedure: LEFT HEART CATH AND CORONARY ANGIOGRAPHY;  Surgeon: Martinique, Peter M, MD;  Location: [facility];  Service: Cardiovascular:  90% prox RCA -> DES PCI. Ost-mLM 30%. Ost-prox Cx 40%.   EF 55-65%. LVEDP 15 mmHg.    MALONEY DILATION  08/25/2019   Procedure: MALONEY DILATION;  Surgeon: Gatha Mayer, MD;  Location: [facility];  Service: Endoscopy;;   RIGHT HEART CATH N/A 07/11/2018   Procedure: RIGHT HEART CATH;  Surgeon: Leonie Man, MD;  Location: [facility];;   Relatively normal Right Heart Cath pressures: PA pressure 26/14 mmHg-mean 20 mmHg; PCWP 13 mmHg.  RAP 6 mmHg, RVP 28/3 mmHg-EDP 10 mmHg. Severely reduced cardiac output and index by Fick: 3.63 and 2.07.   TEE WITHOUT CARDIOVERSION N/A 07/11/2018   Procedure: TRANSESOPHAGEAL ECHOCARDIOGRAM (TEE);  Surgeon: [name], MD;  Location: [facility] ENDOSCOPY;  Service: Cardiology;;  Severe aortic regurgitation-type III. (suggested by holodiastolic flow reversal and descending aorta-vena contracta 6 mm).   TOTAL HIP ARTHROPLASTY     Left   TRANSTHORACIC ECHOCARDIOGRAM  05/2018   EF 60-65%.  No R WMA.  GR 1 DD.  Mild aortic stenosis with severe regurgitation.  Mild MR..  Only mild LV dilation noted.   TRANSTHORACIC ECHOCARDIOGRAM  12/2018    EF 60-65%. Mild LVH. Gr1 DD. Mod AoV thickening. Mod-Severe AI, ~ mlid AS.   RCA PCI January 2020   Immunization History  Administered Date(s) Administered   Fluad Quad(high Dose  65+) 04/03/2019, 04/29/2020, 03/10/2021   Influenza Split 03/27/2012   Influenza Whole 05/08/2007, 04/08/2009,  03/24/2011   Influenza, High Dose Seasonal PF 04/04/2017   Influenza,inj,Quad PF,6+ Mos 03/05/2013, 05/01/2014, 03/05/2015, 04/17/2016, 03/13/2018   PFIZER(Purple Top)SARS-COV-2 Vaccination 08/19/2019, 09/09/2019, 05/17/2020   Pneumococcal Conjugate-13 07/04/2016   Pneumococcal Polysaccharide-23 07/14/2015   Td 04/13/2016   Tdap 07/14/2015    MEDICATIONS/ALLERGIES   Current Meds  Medication Sig   ALPRAZolam (XANAX) 0.25 MG tablet Take 1 tablet (0.25 mg total) by mouth at bedtime as needed for anxiety.   bisacodyl (DULCOLAX) 5 MG EC tablet Take 2 tablets (10 mg total) by mouth at bedtime. (Patient taking differently: Take 10 mg by mouth daily as needed.)   calcium carbonate (TUMS - DOSED IN MG ELEMENTAL CALCIUM) 500 MG chewable tablet Chew 1 tablet by mouth daily as needed for indigestion or heartburn.   clopidogrel (PLAVIX) 75 MG tablet TAKE 1 TABLET (75 MG TOTAL) BY MOUTH DAILY WITH BREAKFAST.   diltiazem (CARDIZEM CD) 360 MG 24 hr capsule Take 1 capsule (360 mg total) by mouth daily.   diphenhydrAMINE HCl, Sleep, (ZZZQUIL) 25 MG CAPS Take 50 mg by mouth at bedtime as needed (sleep).   docusate sodium (COLACE) 100 MG capsule Take 100 mg by mouth daily as needed for mild constipation or moderate constipation.   furosemide (LASIX) 20 MG tablet Take 1 tablet (20 mg total) by mouth daily.   guaiFENesin (ROBITUSSIN) 100 MG/5ML liquid Take 200 mg by mouth 2 (two) times daily as needed for cough.   HYDROcodone-acetaminophen (NORCO) 10-325 MG tablet Take 1 tablet by mouth every 6 (six) hours as needed for severe pain. Please fill on or after 03/16/21   HYDROcodone-acetaminophen (NORCO) 10-325 MG tablet Take 1 tablet by mouth every 6 (six) hours as needed for severe pain. Please fill on or after 05/15/21   hydroxypropyl methylcellulose / hypromellose (ISOPTO TEARS / GONIOVISC) 2.5 %  ophthalmic solution Place 1 drop into both eyes 3 (three) times daily as needed for dry eyes.   ipratropium-albuterol (DUONEB) 0.5-2.5 (3) MG/3ML SOLN Take 3 mLs by nebulization every 4 (four) hours as needed.   isosorbide mononitrate (IMDUR) 120 MG 24 hr tablet TAKE 1 TABLET (120 MG TOTAL) BY MOUTH DAILY.   ketoconazole (NIZORAL) 2 % cream APPLY TO AFFECTED AREA EVERY DAY   lactulose (CHRONULAC) 10 GM/15ML solution    levothyroxine (SYNTHROID) 150 MCG tablet TAKE 1 TABLET BY MOUTH EVERY DAY   methylPREDNISolone (MEDROL DOSEPAK) 4 MG TBPK tablet As directed   naloxegol oxalate (MOVANTIK) 12.5 MG TABS tablet Take 1 tablet (12.5 mg total) by mouth daily.   naproxen sodium (ALEVE) 220 MG tablet Take 220 mg by mouth daily as needed. For Headache   nitroGLYCERIN (NITROSTAT) 0.4 MG SL tablet DISSOLVE ONE TABLET UNDER THE TONGUE EVERY 5 MINUTES AS NEEDED FOR CHEST PAIN.  DO NOT EXCEED A TOTAL OF 3 DOSES IN 15 MINUTES (Patient taking differently: DISSOLVE ONE TABLET UNDER THE TONGUE EVERY 5 MINUTES AS NEEDED FOR CHEST PAIN.  DO NOT EXCEED A TOTAL OF 3 DOSES IN 15 MINUTES)   OXYGEN Inhale 2.5 L/min into the lungs at bedtime.   pantoprazole (PROTONIX) 40 MG tablet Take 1 tablet (40 mg total) by mouth daily before breakfast. (Patient taking differently: Take 40 mg by mouth daily.)   PARoxetine (PAXIL) 40 MG tablet TAKE 1 TABLET BY MOUTH EVERY DAY   phenytoin (DILANTIN) 100 MG ER capsule TAKE 100 MG EVERY MORNING AND 200 MG EVERY NIGHT.   promethazine (PHENERGAN) 25 MG tablet Take 1 tablet (25 mg total) by mouth every  8 (eight) hours as needed for nausea or vomiting.   Respiratory Therapy Supplies (FLUTTER) DEVI As directed   rosuvastatin (CRESTOR) 20 MG tablet TAKE 1 TABLET BY MOUTH EVERY DAY   traZODone (DESYREL) 100 MG tablet TAKE 1 TABLET BY MOUTH EVERYDAY AT BEDTIME   Vitamin D, Ergocalciferol, (DRISDOL) 1.25 MG (50000 UNIT) CAPS capsule TAKE 1 CAPSULE (50,000 UNITS TOTAL) BY MOUTH EVERY MONDAY.     Allergies  Allergen Reactions   Nitrofuran Derivatives Other (See Comments)    confusion   Oxycodone Hcl Other (See Comments)    Knocked her out for 6 days   Morphine Other (See Comments)    "Makes me go crazy."    Penicillins Hives    Has patient had a PCN reaction causing immediate rash, facial/tongue/throat swelling, SOB or lightheadedness with hypotension: unknown Has patient had a PCN reaction causing severe rash involving mucus membranes or skin necrosis: unknown Has patient had a PCN reaction that required hospitalization No Has patient had a PCN reaction occurring within the last 10 years: No If all of the above answers are "NO", then may proceed with Cephalosporin use.  Other reaction(s): Rash-Generalized   Tape Other (See Comments)    Skin turns red and burns    SOCIAL HISTORY/FAMILY HISTORY   Reviewed in Epic:  Pertinent findings:  Social History   Tobacco Use   Smoking status: Every Day    Packs/day: 0.50    Years: 48.00    Pack years: 24.00    Types: Cigarettes   Smokeless tobacco: Never   Tobacco comments:    09/21/20 8cigs/day.  Vaping Use   Vaping Use: Never used  Substance Use Topics   Alcohol use: No   Drug use: No   Social History   Social History Narrative   Married   2 children   No regular exercise          OBJCTIVE -PE, EKG, labs   Wt Readings from Last 3 Encounters:  03/22/21 156 lb (70.8 kg)  03/15/21 159 lb 12 oz (72.5 kg)  03/10/21 158 lb 12.8 oz (72 kg)    Physical Exam: BP (!) 120/38 (BP Location: Right Arm)   Pulse 76   Ht 5\' 6"  (1.676 m)   Wt 156 lb (70.8 kg)   SpO2 94%   BMI 25.18 kg/m  Physical Exam Vitals reviewed.  Constitutional:      General: She is not in acute distress (Moderate chronic distress, not truly lethargic, but borderline).    Appearance: She is ill-appearing. She is not toxic-appearing.     Interventions: Nasal cannula in place.  HENT:     Head: Atraumatic.     Comments: Seems somewhat  puffier-likely response to steroids. Neck:     Vascular: Carotid bruit (Bilateral), hepatojugular reflux and JVD present.  Cardiovascular:     Rate and Rhythm: Normal rate and regular rhythm. Occasional Extrasystoles are present.    Chest Wall: PMI is not displaced.     Pulses: Decreased pulses.          Carotid pulses are 1+ on the right side and 1+ on the left side.      Radial pulses are 0 on the right side and 0 on the left side.     Heart sounds: S1 normal and S2 normal. Heart sounds are distant. Murmur heard.  High-pitched harsh midsystolic murmur is present with a grade of 2/6 at the upper right sternal border radiating to the neck.  High-pitched blowing  decrescendo early diastolic murmur is present with a grade of 3/4 at the upper right sternal border radiating to the apex.    No friction rub. No gallop.     Comments: Normal S1 with faint/split S2 Pulmonary:     Effort: Accessory muscle usage, prolonged expiration and respiratory distress (Always had mild baseline distress) present.     Breath sounds: Wheezing and rhonchi (Diffuse coarse rhonchi with late expiratory wheeze.) present. No decreased breath sounds.  Abdominal:     General: Abdomen is flat. Bowel sounds are normal. There is no distension.     Palpations: Abdomen is soft. There is no mass.     Tenderness: There is no abdominal tenderness.  Musculoskeletal:        General: Swelling (Trivial ankle swelling) present.     Cervical back: Normal range of motion and neck supple.  Skin:    General: Skin is warm and dry.     Comments: Diffuse brownish discoloration in the face and lower legs.  Neurological:     Mental Status: She is oriented to person, place, and time.     Motor: Weakness (Wheelchair-bound.  Legs are profoundly weak) present.  Psychiatric:        Mood and Affect: Mood normal.        Behavior: Behavior is cooperative.        Thought Content: Thought content normal.     Comments: Very slow to respond to  questions.  Flat affect.  Relatively noncommittal, and difficult to obtain cardiac story.     Adult ECG Report  Rate: 77;  Rhythm: Incomplete RBBB-LVH with RSR' Pattern suggestive of RV conduction delay.;   Narrative Interpretation: Relatively stable EKG from before, however not normal.  Recent Labs: Stable labs Lab Results  Component Value Date   CHOL 128 06/29/2018   HDL 35 (L) 06/29/2018   LDLCALC 56 06/29/2018   LDLDIRECT 187.0 07/14/2015   TRIG 184 (H) 06/29/2018   CHOLHDL 3.7 06/29/2018   Lab Results  Component Value Date   CREATININE 0.89 03/15/2021   BUN 17 03/15/2021   NA 141 03/15/2021   K 4.2 03/15/2021   CL 100 03/15/2021   CO2 34 (H) 03/15/2021   CBC Latest Ref Rng & Units 03/15/2021 01/26/2021 01/26/2021  WBC 4.0 - 10.5 K/uL 6.3 5.8 5.9  Hemoglobin 12.0 - 15.0 g/dL 10.6(L) 9.2(L) 9.3(L)  Hematocrit 36.0 - 46.0 % 35.4(L) 29.3(L) 30.1(L)  Platelets 150 - 400 K/uL 251 250 265    Lab Results  Component Value Date   HGBA1C  09/17/2008    5.0 (NOTE)   The ADA recommends the following therapeutic goal for glycemic   control related to Hgb A1C measurement:   Goal of Therapy:   < 7.0% Hgb A1C   Reference: American Diabetes Association: Clinical Practice   Recommendations 2008, Diabetes Care,  2008, 31:(Suppl 1).   Lab Results  Component Value Date   TSH 1.84 01/01/2019    ==================================================  COVID-19 Education: The signs and symptoms of COVID-19 were discussed with the patient and how to seek care for testing (follow up with PCP or arrange E-visit).    I spent a total of 61 minutes with the patient spent in direct patient consultation.  Additional time spent with chart review  / charting (studies, outside notes, etc): 25 min Total Time: 81 min  Current medicines are reviewed at length with the patient today.  (+/- concerns) multiple concerns noted above.  This visit occurred during the  SARS-CoV-2 public health emergency.   Safety protocols were in place, including screening questions prior to the visit, additional usage of staff PPE, and extensive cleaning of exam room while observing appropriate contact time as indicated for disinfecting solutions.  Notice: This dictation was prepared with Dragon dictation along with smart phrase technology. Any transcriptional errors that result from this process are unintentional and may not be corrected upon review.  Patient Instructions / Medication Changes & Studies & Tests Ordered   Patient Instructions  Medication Instructions:  No changes  *If you need a refill on your cardiac medications before your next appointment, please call your pharmacy*   Lab Work: Not needed    Testing/Procedures:  Will be schedule at [facility] has requested that you have a Right and Left cardiac catheterization. Cardiac catheterization is used to diagnose and/or treat various heart conditions. Doctors may recommend this procedure for a number of different reasons. The most common reason is to evaluate chest pain. Chest pain can be a symptom of coronary artery disease (CAD), and cardiac catheterization can show whether plaque is narrowing or blocking your heart's arteries. This procedure is also used to evaluate the valves, as well as measure the blood flow and oxygen levels in different parts of your heart. Please follow instruction sheet, as given.   Follow-Up: At [facility], you and your health needs are our priority.  As part of our continuing mission to provide you with exceptional heart care, we have created designated Provider Care Teams.  These Care Teams include your primary Cardiologist (physician) and Advanced Practice Providers (APPs -  Physician Assistants and Nurse Practitioners) who all work together to provide you with the care you need, when you need it.     Your next appointment:          3 to 4 weeks   The format for your  next appointment:   In Person  Provider:   [name], MD   Other Instructions   [place] [address] Dept: [phone] Loc: [place]  03/22/2021  You are scheduled for a  Right and Left Cardiac Catheterization on Friday, October 7 with Dr. [name].  1. Please arrive at the [facility] (Main Entrance A) at [facility]: [address] [place], [place] [address] at 8:30 AM (This time is two hours before your procedure to ensure your preparation). Free valet parking service is available.   Special note: Every effort is made to have your procedure done on time. Please understand that emergencies sometimes delay scheduled procedures.  2. Diet: Do not eat solid foods after midnight.  The patient may have clear liquids until 5am upon the day of the procedure.  3. Labs:  no labs are needed. You labwork done 03/15/21  4. Medication instructions in preparation for your procedure:   Contrast Allergy: No   Do not take Furosemide the morning of procedure   On the morning of your procedure, take your Aspirin and Plavix/Clopidogrel and any morning medicines NOT listed above.  You may use sips of water.  5. Plan for one night stay--bring personal belongings. 6. Bring a current list of your medications and current insurance cards. 7. You MUST have a responsible person to drive you home. 8. Someone MUST be with you the first 24 hours after you arrive home or your discharge  will be delayed. 9. Please wear clothes that are easy to get on and off and wear slip-on shoes.  Thank you for allowing [place] to care for you!   -- [place] [facility]    Studies Ordered:   Orders Placed This Encounter  Procedures   EKG 12-Lead   LEFT AND RIGHT HEART CATHETERIZATION WITH CORONARY Illene Silver, M.D.,  M.S. Interventional Cardiologist   Pager # [phone] Phone # [phone] [address] [place], [place] [address]   Thank you for choosing Heartcare at [facility]!!

## 2021-03-22 NOTE — Patient Instructions (Addendum)
Medication Instructions:  No changes  *If you need a refill on your cardiac medications before your next appointment, please call your pharmacy*   Lab Work: Not needed    Testing/Procedures:  Will be schedule at [facility] has requested that you have a Right and Left cardiac catheterization. Cardiac catheterization is used to diagnose and/or treat various heart conditions. Doctors may recommend this procedure for a number of different reasons. The most common reason is to evaluate chest pain. Chest pain can be a symptom of coronary artery disease (CAD), and cardiac catheterization can show whether plaque is narrowing or blocking your heart's arteries. This procedure is also used to evaluate the valves, as well as measure the blood flow and oxygen levels in different parts of your heart. Please follow instruction sheet, as given.   Follow-Up: At [facility], you and your health needs are our priority.  As part of our continuing mission to provide you with exceptional heart care, we have created designated Provider Care Teams.  These Care Teams include your primary Cardiologist (physician) and Advanced Practice Providers (APPs -  Physician Assistants and Nurse Practitioners) who all work together to provide you with the care you need, when you need it.     Your next appointment:          3 to 4 weeks   The format for your next appointment:   In Person  Provider:   [name], MD   Other Instructions   [place] [address] Dept: [phone] Loc: [place]  03/22/2021  You are scheduled for a  Right and Left Cardiac Catheterization on Friday, October 7 with Dr. [name].  1. Please arrive at the [facility] (Main Entrance A) at [facility]: [address] [place], [place] [address] at 8:30  AM (This time is two hours before your procedure to ensure your preparation). Free valet parking service is available.   Special note: Every effort is made to have your procedure done on time. Please understand that emergencies sometimes delay scheduled procedures.  2. Diet: Do not eat solid foods after midnight.  The patient may have clear liquids until 5am upon the day of the procedure.  3. Labs:  no labs are needed. You labwork done 03/15/21  4. Medication instructions in preparation for your procedure:   Contrast Allergy: No   Do not take Furosemide the morning of procedure   On the morning of your procedure, take your Aspirin and Plavix/Clopidogrel and any morning medicines NOT listed above.  You may use sips of water.  5. Plan for one night stay--bring personal belongings. 6. Bring a current list of your medications and current insurance cards. 7. You MUST have a responsible person to drive you home. 8. Someone MUST be with you the first 24 hours after you arrive home or your discharge will be delayed. 9. Please wear clothes that are easy to get on and off and wear slip-on shoes.  Thank you for allowing [place] to care for you!   -- [place] [facility]

## 2021-03-23 ENCOUNTER — Encounter: Payer: Self-pay | Admitting: Cardiology

## 2021-03-23 NOTE — Assessment & Plan Note (Signed)
Very atypical sounding symptoms for possible angina, but with the extent of discomfort she is having, I think we need to answer the question now that her stress test has been read as high risk.  My personal review of the stress test is not that it is high risk I do not see a defect.  However 2 separate noninvasive cardiologists have reviewed the study and agree that there is an abnormality.  With her significant worsening dyspnea and now this chest pain which may or may not be anginal in nature, I discussed with her different options including simply doing medical management and only going to the Cath Lab if symptoms would not resolve versus going to the Cath Lab first to identify what exactly the stress test is indicating and to assess LVEDP as well as right heart pressures.  After a very prolonged conversation during which she was very Noncommittal as was her husband, we finally decided To Proceed with Right and Left Heart Catheterization.  The intention would be to evaluate for pulmonary hypertension, and also elevated PCWP/LVEDP measurements in setting of moderate-severe aortic insufficiency..  Plan:  We will schedule for right and left heart catheterization next week (see shared decision consent note below)  She is on Plavix, statin, Imdur and diltiazem stable doses.  No beta-blocker because of severe COPD, and we have been reluctant to use ARB in the past because of prior issues with hypotension   Shared Decision Making/Informed Consent The risks [stroke (1 in 1000), death (1 in 1000), kidney failure [usually temporary] (1 in 500), bleeding (1 in 200), allergic reaction [possibly serious] (1 in 200)], benefits (diagnostic support and management of coronary artery disease) and alternatives of a right and left heart catheterization with possible percutaneous coronary intervention were discussed in detail with Kaylee Hansen and she is willing to proceed.

## 2021-03-23 NOTE — Assessment & Plan Note (Signed)
Has not been tolerant of statins very much, but is on moderate dose rosuvastatin. Most recent lipid panel in January was pretty well controlled.  We can probably check lipids when she comes in for cath.  Continue current dose of rosuvastatin

## 2021-03-23 NOTE — Assessment & Plan Note (Signed)
On maintenance dose Plavix.  With upcoming cardiac catheterization, would not hold until cath performed.    If no additional PCI required, then would be able to restart. Okay to hold Plavix 5 day preop for surgery procedures.

## 2021-03-23 NOTE — Assessment & Plan Note (Signed)
On high-dose diltiazem along with furosemide.  Pressures look good.  We have been reluctant to use ARB/ACE inhibitor in the past because of borderline renal issues and borderline hypotension.

## 2021-03-23 NOTE — Assessment & Plan Note (Signed)
Very unusual.  She has very atypical sounding chest pain symptoms, and her Myoview was read as abnormal, high risk however I do not see significant reversible defects.  Given the equivocal nature of the testing and her ongoing symptoms, I think we are obligated now to consider proceeding to the cardiac catheterization lab.  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  Will be scheduled for Friday, October 7.

## 2021-03-23 NOTE — Assessment & Plan Note (Signed)
Still working on trying to quit, but not yet ready to discuss.

## 2021-03-24 ENCOUNTER — Other Ambulatory Visit: Payer: Self-pay

## 2021-03-24 ENCOUNTER — Inpatient Hospital Stay: Payer: Medicare HMO

## 2021-03-24 VITALS — BP 137/69 | HR 75 | Temp 97.5°F | Resp 17

## 2021-03-24 DIAGNOSIS — C3492 Malignant neoplasm of unspecified part of left bronchus or lung: Secondary | ICD-10-CM | POA: Diagnosis not present

## 2021-03-24 DIAGNOSIS — D649 Anemia, unspecified: Secondary | ICD-10-CM | POA: Diagnosis not present

## 2021-03-24 DIAGNOSIS — D500 Iron deficiency anemia secondary to blood loss (chronic): Secondary | ICD-10-CM

## 2021-03-24 MED ORDER — SODIUM CHLORIDE 0.9 % IV SOLN: Freq: Once | INTRAVENOUS | Status: AC

## 2021-03-24 MED ORDER — SODIUM CHLORIDE 0.9 % IV SOLN
200.0000 mg | Freq: Once | INTRAVENOUS | Status: AC
  Administered 2021-03-24: 200 mg via INTRAVENOUS
  Filled 2021-03-24: qty 10

## 2021-03-25 DIAGNOSIS — C3490 Malignant neoplasm of unspecified part of unspecified bronchus or lung: Secondary | ICD-10-CM

## 2021-03-25 DIAGNOSIS — J449 Chronic obstructive pulmonary disease, unspecified: Secondary | ICD-10-CM | POA: Diagnosis not present

## 2021-03-30 DIAGNOSIS — M1712 Unilateral primary osteoarthritis, left knee: Secondary | ICD-10-CM | POA: Diagnosis not present

## 2021-03-31 ENCOUNTER — Telehealth: Payer: Self-pay | Admitting: *Deleted

## 2021-03-31 ENCOUNTER — Other Ambulatory Visit: Payer: Self-pay

## 2021-03-31 ENCOUNTER — Inpatient Hospital Stay: Payer: Medicare HMO | Attending: Hematology & Oncology

## 2021-03-31 VITALS — BP 127/32 | HR 73 | Temp 98.6°F | Resp 18

## 2021-03-31 DIAGNOSIS — D649 Anemia, unspecified: Secondary | ICD-10-CM | POA: Diagnosis not present

## 2021-03-31 DIAGNOSIS — D500 Iron deficiency anemia secondary to blood loss (chronic): Secondary | ICD-10-CM

## 2021-03-31 DIAGNOSIS — C3412 Malignant neoplasm of upper lobe, left bronchus or lung: Secondary | ICD-10-CM | POA: Diagnosis not present

## 2021-03-31 MED ORDER — SODIUM CHLORIDE 0.9 % IV SOLN: Freq: Once | INTRAVENOUS | Status: AC

## 2021-03-31 MED ORDER — SODIUM CHLORIDE 0.9 % IV SOLN
200.0000 mg | Freq: Once | INTRAVENOUS | Status: AC
  Administered 2021-03-31: 200 mg via INTRAVENOUS
  Filled 2021-03-31: qty 200

## 2021-03-31 NOTE — Telephone Encounter (Signed)
Cardiac catheterization scheduled at [facility] for: Friday April 01, 2021 10:30 AM [facility] Main Entrance A [facility]) at: 8:30 AM   No solid food after midnight prior to cath, clear liquids until 5 AM day of procedure.  Medication instructions: Hold: Lasix-AM of procedure  Except hold medications usual morning medications can be taken pre-cath with sips of water including: - aspirin 81 mg -Plavix 75 mg    Confirmed patient has responsible adult to drive home post procedure and be with patient first 24 hours after arriving home.  [facility] does allow one visitor to accompany you and wait in the hospital waiting room while you are there for your procedure. You and your visitor will be asked to wear a mask once you enter the hospital.   Patient reports does not currently have any symptoms concerning for COVID-19 and no household members with COVID-19 like illness.   Reviewed procedure/mask/visitor instructions with patient's husband (DPR).

## 2021-03-31 NOTE — Patient Instructions (Signed)

## 2021-04-01 ENCOUNTER — Encounter (HOSPITAL_COMMUNITY): Payer: Self-pay | Admitting: Cardiology

## 2021-04-01 ENCOUNTER — Ambulatory Visit (HOSPITAL_COMMUNITY)
Admission: RE | Disposition: A | Discharge status: Home / Self Care | Payer: Self-pay | Source: Home / Self Care | Attending: Cardiology

## 2021-04-01 ENCOUNTER — Ambulatory Visit (HOSPITAL_COMMUNITY)
Admission: RE | Admit: 2021-04-01 | Discharge: 2021-04-01 | Disposition: A | Discharge status: Home / Self Care | Payer: Medicare HMO | Attending: Cardiology | Admitting: Cardiology

## 2021-04-01 DIAGNOSIS — Z885 Allergy status to narcotic agent status: Secondary | ICD-10-CM | POA: Diagnosis not present

## 2021-04-01 DIAGNOSIS — R0782 Intercostal pain: Secondary | ICD-10-CM

## 2021-04-01 DIAGNOSIS — I351 Nonrheumatic aortic (valve) insufficiency: Secondary | ICD-10-CM | POA: Diagnosis not present

## 2021-04-01 DIAGNOSIS — Z79890 Hormone replacement therapy (postmenopausal): Secondary | ICD-10-CM | POA: Diagnosis not present

## 2021-04-01 DIAGNOSIS — R9439 Abnormal result of other cardiovascular function study: Secondary | ICD-10-CM | POA: Diagnosis not present

## 2021-04-01 DIAGNOSIS — Z880 Allergy status to penicillin: Secondary | ICD-10-CM | POA: Diagnosis not present

## 2021-04-01 DIAGNOSIS — Z7902 Long term (current) use of antithrombotics/antiplatelets: Secondary | ICD-10-CM | POA: Insufficient documentation

## 2021-04-01 DIAGNOSIS — I2510 Atherosclerotic heart disease of native coronary artery without angina pectoris: Secondary | ICD-10-CM

## 2021-04-01 DIAGNOSIS — Z79899 Other long term (current) drug therapy: Secondary | ICD-10-CM | POA: Diagnosis not present

## 2021-04-01 DIAGNOSIS — Z955 Presence of coronary angioplasty implant and graft: Secondary | ICD-10-CM | POA: Diagnosis not present

## 2021-04-01 DIAGNOSIS — I2089 Other forms of angina pectoris: Secondary | ICD-10-CM | POA: Clinically undetermined

## 2021-04-01 DIAGNOSIS — E785 Hyperlipidemia, unspecified: Secondary | ICD-10-CM | POA: Insufficient documentation

## 2021-04-01 DIAGNOSIS — Z91048 Other nonmedicinal substance allergy status: Secondary | ICD-10-CM | POA: Insufficient documentation

## 2021-04-01 DIAGNOSIS — I25119 Atherosclerotic heart disease of native coronary artery with unspecified angina pectoris: Secondary | ICD-10-CM | POA: Insufficient documentation

## 2021-04-01 DIAGNOSIS — I10 Essential (primary) hypertension: Secondary | ICD-10-CM | POA: Diagnosis not present

## 2021-04-01 DIAGNOSIS — I208 Other forms of angina pectoris: Secondary | ICD-10-CM | POA: Clinically undetermined

## 2021-04-01 DIAGNOSIS — F17210 Nicotine dependence, cigarettes, uncomplicated: Secondary | ICD-10-CM | POA: Diagnosis not present

## 2021-04-01 HISTORY — PX: RIGHT/LEFT HEART CATH AND CORONARY ANGIOGRAPHY: CATH118266

## 2021-04-01 LAB — POCT I-STAT EG7
Acid-Base Excess: 2 mmol/L (ref 0.0–2.0)
Acid-Base Excess: 4 mmol/L — ABNORMAL HIGH (ref 0.0–2.0)
Bicarbonate: 29.7 mmol/L — ABNORMAL HIGH (ref 20.0–28.0)
Bicarbonate: 30.9 mmol/L — ABNORMAL HIGH (ref 20.0–28.0)
Calcium, Ion: 1.24 mmol/L (ref 1.15–1.40)
Calcium, Ion: 1.3 mmol/L (ref 1.15–1.40)
HCT: 27 % — ABNORMAL LOW (ref 36.0–46.0)
HCT: 28 % — ABNORMAL LOW (ref 36.0–46.0)
Hemoglobin: 9.2 g/dL — ABNORMAL LOW (ref 12.0–15.0)
Hemoglobin: 9.5 g/dL — ABNORMAL LOW (ref 12.0–15.0)
O2 Saturation: 65 %
O2 Saturation: 65 %
Potassium: 4.3 mmol/L (ref 3.5–5.1)
Potassium: 4.4 mmol/L (ref 3.5–5.1)
Sodium: 139 mmol/L (ref 135–145)
Sodium: 140 mmol/L (ref 135–145)
TCO2: 31 mmol/L (ref 22–32)
TCO2: 33 mmol/L — ABNORMAL HIGH (ref 22–32)
pCO2, Ven: 60.4 mmHg — ABNORMAL HIGH (ref 44.0–60.0)
pCO2, Ven: 61.9 mmHg — ABNORMAL HIGH (ref 44.0–60.0)
pH, Ven: 7.299 (ref 7.250–7.430)
pH, Ven: 7.307 (ref 7.250–7.430)
pO2, Ven: 38 mmHg (ref 32.0–45.0)
pO2, Ven: 38 mmHg (ref 32.0–45.0)

## 2021-04-01 LAB — POCT I-STAT 7, (LYTES, BLD GAS, ICA,H+H)
Acid-Base Excess: 3 mmol/L — ABNORMAL HIGH (ref 0.0–2.0)
Bicarbonate: 30 mmol/L — ABNORMAL HIGH (ref 20.0–28.0)
Calcium, Ion: 1.27 mmol/L (ref 1.15–1.40)
HCT: 27 % — ABNORMAL LOW (ref 36.0–46.0)
Hemoglobin: 9.2 g/dL — ABNORMAL LOW (ref 12.0–15.0)
O2 Saturation: 100 %
Potassium: 4.4 mmol/L (ref 3.5–5.1)
Sodium: 139 mmol/L (ref 135–145)
TCO2: 32 mmol/L (ref 22–32)
pCO2 arterial: 57.3 mmHg — ABNORMAL HIGH (ref 32.0–48.0)
pH, Arterial: 7.327 — ABNORMAL LOW (ref 7.350–7.450)
pO2, Arterial: 186 mmHg — ABNORMAL HIGH (ref 83.0–108.0)

## 2021-04-01 SURGERY — RIGHT/LEFT HEART CATH AND CORONARY ANGIOGRAPHY
Anesthesia: LOCAL

## 2021-04-01 MED ORDER — VERAPAMIL HCL 2.5 MG/ML IV SOLN
INTRAVENOUS | Status: AC
  Filled 2021-04-01: qty 2

## 2021-04-01 MED ORDER — HEPARIN (PORCINE) IN NACL 1000-0.9 UT/500ML-% IV SOLN
INTRAVENOUS | Status: AC
  Filled 2021-04-01: qty 1000

## 2021-04-01 MED ORDER — HEPARIN SODIUM (PORCINE) 1000 UNIT/ML IJ SOLN
INTRAMUSCULAR | Status: DC | PRN
  Administered 2021-04-01: 3500 [IU] via INTRAVENOUS

## 2021-04-01 MED ORDER — SODIUM CHLORIDE 0.9% FLUSH: 3.0000 mL | INTRAVENOUS | Status: DC | PRN

## 2021-04-01 MED ORDER — LIDOCAINE HCL (PF) 1 % IJ SOLN
INTRAMUSCULAR | Status: DC | PRN
  Administered 2021-04-01 (×2): 2 mL via INTRADERMAL

## 2021-04-01 MED ORDER — ONDANSETRON HCL 4 MG/2ML IJ SOLN: 4.0000 mg | Freq: Four times a day (QID) | INTRAMUSCULAR | Status: DC | PRN

## 2021-04-01 MED ORDER — HEPARIN (PORCINE) IN NACL 1000-0.9 UT/500ML-% IV SOLN
INTRAVENOUS | Status: DC | PRN
  Administered 2021-04-01 (×2): 500 mL

## 2021-04-01 MED ORDER — IOHEXOL 350 MG/ML SOLN
INTRAVENOUS | Status: DC | PRN
  Administered 2021-04-01: 60 mL

## 2021-04-01 MED ORDER — LIDOCAINE HCL (PF) 1 % IJ SOLN
INTRAMUSCULAR | Status: AC
  Filled 2021-04-01: qty 30

## 2021-04-01 MED ORDER — LABETALOL HCL 5 MG/ML IV SOLN: 10.0000 mg | INTRAVENOUS | Status: DC | PRN

## 2021-04-01 MED ORDER — SODIUM CHLORIDE 0.9 % WEIGHT BASED INFUSION
3.0000 mL/kg/h | INTRAVENOUS | Status: AC
  Administered 2021-04-01: 3 mL/kg/h via INTRAVENOUS

## 2021-04-01 MED ORDER — HEPARIN SODIUM (PORCINE) 1000 UNIT/ML IJ SOLN
INTRAMUSCULAR | Status: AC
  Filled 2021-04-01: qty 1

## 2021-04-01 MED ORDER — SODIUM CHLORIDE 0.9% FLUSH: 3.0000 mL | Freq: Two times a day (BID) | INTRAVENOUS | Status: DC

## 2021-04-01 MED ORDER — ACETAMINOPHEN 325 MG PO TABS: 650.0000 mg | ORAL_TABLET | ORAL | Status: DC | PRN

## 2021-04-01 MED ORDER — HYDRALAZINE HCL 20 MG/ML IJ SOLN: 10.0000 mg | INTRAMUSCULAR | Status: DC | PRN

## 2021-04-01 MED ORDER — SODIUM CHLORIDE 0.9 % IV SOLN: 250.0000 mL | INTRAVENOUS | Status: DC | PRN

## 2021-04-01 MED ORDER — SODIUM CHLORIDE 0.9 % IV SOLN: INTRAVENOUS | Status: DC

## 2021-04-01 MED ORDER — SODIUM CHLORIDE 0.9 % WEIGHT BASED INFUSION: 1.0000 mL/kg/h | INTRAVENOUS | Status: DC

## 2021-04-01 SURGICAL SUPPLY — 13 items
CATH BALLN WEDGE 5F 110CM (CATHETERS) ×2 IMPLANT
CATH INFINITI 5FR ANG PIGTAIL (CATHETERS) ×2 IMPLANT
CATH OPTITORQUE TIG 4.0 5F (CATHETERS) ×2 IMPLANT
DEVICE RAD COMP TR BAND LRG (VASCULAR PRODUCTS) ×2 IMPLANT
GLIDESHEATH SLEND SS 6F .021 (SHEATH) ×2 IMPLANT
GUIDEWIRE INQWIRE 1.5J.035X260 (WIRE) ×2 IMPLANT
INQWIRE 1.5J .035X260CM (WIRE) ×4
KIT HEART LEFT (KITS) ×2 IMPLANT
PACK CARDIAC CATHETERIZATION (CUSTOM PROCEDURE TRAY) ×2 IMPLANT
SHEATH GLIDE SLENDER 4/5FR (SHEATH) ×2 IMPLANT
SYR MEDRAD MARK 7 150ML (SYRINGE) ×2 IMPLANT
TRANSDUCER W/STOPCOCK (MISCELLANEOUS) ×2 IMPLANT
TUBING CIL FLEX 10 FLL-RA (TUBING) ×2 IMPLANT

## 2021-04-01 NOTE — Interval H&P Note (Signed)
History and Physical Interval Note:  04/01/2021 9:49 AM  Kaylee Hansen  has presented today for surgery, with the diagnosis of abnormal myoview, aortic regurgitation, dyspnea.  The various methods of treatment have been discussed with the patient and family. After consideration of risks, benefits and other options for treatment, the patient has consented to  Procedure(s): RIGHT/LEFT HEART CATH AND CORONARY ANGIOGRAPHY (N/A)  PERCUTANEOUS CORONARY INTERVENTION   as a surgical intervention.  The patient's history has been reviewed, patient examined, no change in status, stable for surgery.  I have reviewed the patient's chart and labs.  Questions were answered to the patient's satisfaction.    Cath Lab Visit (complete for each Cath Lab visit)  Clinical Evaluation Leading to the Procedure:   ACS: No.  Non-ACS:    Anginal Classification: CCS III  Anti-ischemic medical therapy: Maximal Therapy (2 or more classes of medications)  Non-Invasive Test Results: High-risk stress test findings: cardiac mortality >3%/year  Prior CABG: No previous CABG   Kaylee Hansen

## 2021-04-01 NOTE — Discharge Instructions (Signed)
Radial Site Care  This sheet gives you information about how to care for yourself after your procedure. Your health care provider may also give you more specific instructions. If you have problems or questions, contact your health care provider. What can I expect after the procedure? After the procedure, it is common to have: Bruising and tenderness at the catheter insertion area. Follow these instructions at home: Medicines Take over-the-counter and prescription medicines only as told by your health care provider. Insertion site care Follow instructions from your health care provider about how to take care of your insertion site. Make sure you: Wash your hands with soap and water before you remove your bandage (dressing). If soap and water are not available, use hand sanitizer. May remove dressing in 24 hours. Check your insertion site every day for signs of infection. Check for: Redness, swelling, or pain. Fluid or blood. Pus or a bad smell. Warmth. Do no take baths, swim, or use a hot tub for 5 days. You may shower 24-48 hours after the procedure. Remove the dressing and gently wash the site with plain soap and water. Pat the area dry with a clean towel. Do not rub the site. That could cause bleeding. Do not apply powder or lotion to the site. Activity  For 24 hours after the procedure, or as directed by your health care provider: Do not flex or bend the affected arm. Do not push or pull heavy objects with the affected arm. Do not drive yourself home from the hospital or clinic. You may drive 24 hours after the procedure. Do not operate machinery or power tools. KEEP ARM ELEVATED THE REMAINDER OF THE DAY. Do not push, pull or lift anything that is heavier than 10 lb for 5 days. Ask your health care provider when it is okay to: Return to work or school. Resume usual physical activities or sports. Resume sexual activity. General instructions If the catheter site starts to  bleed, raise your arm and put firm pressure on the site. If the bleeding does not stop, get help right away. This is a medical emergency. DRINK PLENTY OF FLUIDS FOR THE NEXT 2-3 DAYS. No alcohol consumption for 24 hours after receiving sedation. If you went home on the same day as your procedure, a responsible adult should be with you for the first 24 hours after you arrive home. Keep all follow-up visits as told by your health care provider. This is important. Contact a health care provider if: You have a fever. You have redness, swelling, or yellow drainage around your insertion site. Get help right away if: You have unusual pain at the radial site. The catheter insertion area swells very fast. The insertion area is bleeding, and the bleeding does not stop when you hold steady pressure on the area. Your arm or hand becomes pale, cool, tingly, or numb. These symptoms may represent a serious problem that is an emergency. Do not wait to see if the symptoms will go away. Get medical help right away. Call your local emergency services (911 in the [place] Do not drive yourself to the hospital. Summary After the procedure, it is common to have bruising and tenderness at the site. Follow instructions from your health care provider about how to take care of your radial site wound. Check the wound every day for signs of infection.  This information is not intended to replace advice given to you by your health care provider. Make sure you discuss any questions you have with   your health care provider. Document Revised: 07/18/2017 Document Reviewed: 07/18/2017 Elsevier Patient Education  2020 [employer]  

## 2021-04-05 MED FILL — Verapamil HCl IV Soln 2.5 MG/ML: INTRAVENOUS | Qty: 2 | Status: AC

## 2021-04-07 ENCOUNTER — Inpatient Hospital Stay: Payer: Medicare HMO

## 2021-04-07 ENCOUNTER — Other Ambulatory Visit: Payer: Self-pay

## 2021-04-07 VITALS — BP 139/37 | HR 80 | Temp 98.2°F | Resp 22

## 2021-04-07 DIAGNOSIS — D500 Iron deficiency anemia secondary to blood loss (chronic): Secondary | ICD-10-CM

## 2021-04-07 DIAGNOSIS — D649 Anemia, unspecified: Secondary | ICD-10-CM | POA: Diagnosis not present

## 2021-04-07 DIAGNOSIS — C3412 Malignant neoplasm of upper lobe, left bronchus or lung: Secondary | ICD-10-CM | POA: Diagnosis not present

## 2021-04-07 MED ORDER — SODIUM CHLORIDE 0.9 % IV SOLN
200.0000 mg | Freq: Once | INTRAVENOUS | Status: AC
  Administered 2021-04-07: 200 mg via INTRAVENOUS
  Filled 2021-04-07: qty 200

## 2021-04-07 MED ORDER — SODIUM CHLORIDE 0.9 % IV SOLN: Freq: Once | INTRAVENOUS | Status: AC

## 2021-04-07 NOTE — Progress Notes (Signed)
Pt declined to stay for post infusion observation period. Pt stated she has tolerated medication multiple times prior without difficulty. Pt aware to call clinic with any questions or concerns. Pt verbalized understanding and had no further questions.  ? ?

## 2021-04-07 NOTE — Patient Instructions (Signed)

## 2021-04-10 ENCOUNTER — Encounter: Payer: Self-pay | Admitting: Cardiology

## 2021-04-10 NOTE — Progress Notes (Signed)
Primary Care Provider: Cassandria Anger, MD Cardiologist: [name], MD Electrophysiologist: None  Clinic Note: Chief Complaint  Patient presents with   Hospitalization Follow-up    Post cath follow-up; stable symptoms, but happy with cardiac cath results.    ===================================  ASSESSMENT/PLAN   Problem List Items Addressed This Visit       Cardiology Problems   Severe Stage C1 aortic regurgitation by prior echocardiography - Primary (Chronic)    Echocardiograms have been relatively stable.  Unfortunately aortic regurgitation is not an indication for TAVR, and she would not likely be an open AVR candidate.  For now, continue medical management.  We will recheck echocardiogram in January timeframe which would be 6 months out.  Relatively reassuring right heart cath findings with LVEDP of only 20.  We will have her increase diuretic dosing over the next several days based on elevated LVEDP.  She will take 40 mg Monday Wednesday Friday of the next 2 weeks and then reduce to 20 mg daily. Sliding-scale Lasix.  Blood pressure stable.  Continue diltiazem for rate control and blood pressure.  Monitor closely for consideration of afterload reduction with ARB.      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  Because of her discomfort may be potentially GI related, we discussed that we can probably stop either aspirin or Plavix.  Since he is now about 3 years out from PCI, we can stop Plavix and just simply continue aspirin.  With preserved EF, appropriately on diltiazem given her COPD as well as a beta-blocker.  Monitor blood pressures closely with ferritin deficiency and consider the possibility of ARB for now we will continue current meds with Imdur and  diltiazem.  Unlikely that her symptoms are related to angina. Plan:  Continue diltiazem and Imdur along with rosuvastatin. Stop Plavix, continue aspirin      Relevant Medications   aspirin EC 81 MG tablet   Hyperlipidemia with target LDL less than 70 (Chronic)    Has been statin intolerant, however was tolerating rosuvastatin 20 mg for the last year. Needs lipid panel follow-up.  Due in January.      Relevant Medications   aspirin EC 81 MG tablet   Essential hypertension, benign (Chronic)    Blood pressures have been stable on diltiazem, furosemide and Imdur.  She has had borderline hypotension and renal issues in the past, therefore we are holding off on ACE or ARB, but if pressures do increase, in light of her.  Regurgitation, would probably have a low threshold to add low-dose ARB.      Relevant Medications   aspirin EC 81 MG tablet   Atypical angina (HCC) (Chronic)    Thankfully, her multiple different types of chest pain all seem to be nonanginal in nature based on cardiac catheterization findings.  Continue current dose of diltiazem and Imdur for now.      Relevant Medications   aspirin EC 81 MG tablet     Other   Chest pain (Chronic)    Several different types of chest pain.  Clearly cellulitis costochondritis or musculoskeletal in general.  She may get some chest discomfort related to her COPD.  Unfortunately, my view is not a good test based on recent results.  We may actually do better with coronary CTA  in the future      Presence of drug coated stent in right coronary artery (Chronic)    Now over 2 years out from PCI of the RCA.  Plan to discontinue clopidogrel and continue aspirin, in an effort to avoid excess bleeding and GI upset.  Okay to hold aspirin 5 to 7 days preop for surgeries or procedures.       Iron deficiency anemia due to chronic blood loss (Chronic)    Chronic issue.  Plan to discontinue Plavix      Other Visit Diagnoses     Aortic  valve insufficiency, etiology of cardiac valve disease unspecified       Relevant Medications   aspirin EC 81 MG tablet   Other Relevant Orders   EKG 12-Lead (Completed)   ECHOCARDIOGRAM COMPLETE      ===================================  HPI:    Kaylee Hansen is a 65 y.o. female with a very complicated PMH - notably CAD-PCI to RCA in January 2020 for non-STEMI Along with Sgnificant Valvular Heart Disease who presents today for post-cath follow-up follow-up.   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 Follows with Dr. Marin Olp for chronic anemia-iron deficiency.  Hemoglobin was most recently 10.6.  He discussed the possibility of Epogen.  Recent Visits in 2022: September 16, 2020 -> stable on baseline oxygen, wheelchair-bound.  Sharp chest pains with inspiration lasting 10 to 15 minutes.  Not c/w MI pain.  Chronic cough with dyspnea and wheezing.  Abdominal pain, constipation diffuse arthritis pains with radiculopathy from back pain.  Limited mobility.   Increased diltiazem from 240 up to 360 mg.  Refilled PRN Lasix.  Mg  Interim evaluations: June 30: Called triage with chest pain.  Did not go to ER. August 12 (after ER visit for COPD exacerbation on August 3): Called in with chest pain, took SL NTG.  Imdur dose increased.  Did not go to ER. August 31-similar chest pain -> worked in to see Dr. Audie Box  Dr. Audie Box on 03/01/2021 for again chest pains lasting 10 to 15 minutes.  Felt to be related to COPD, but Lexiscan Myoview ordered. => Myoview with unclear results - read as Abnormal with Reversible Mod Sized, mid-basal Inferior defect  Kaylee Hansen was last seen by me on March 22, 2021 to follow-up after the "abnormal "  Myoview.  She was continuing to have dyspnea and intermittent episodes of chest discomfort.  She had several different types of chest discomfort some sharp some squeezing some can last up to 10 to 15 minutes.  She is not very active and therefore is difficult to tell if he symptoms are necessarily exertional or not. => For clarification purposes she was sent for cardiac catheterization.  Recent Hospitalizations:  04/01/2021-Right and Left Heart Cath With Coronary Angiography  Reviewed  CV studies:    The following studies were reviewed today: (if available, images/films reviewed: From Epic Chart or Care Everywhere) 04/01/2021-Right and Left Heart Cath With Coronary Angiography Very large-dominant RCA with widely patent stent.  LCx ost-prox 30%. Diminutive LAD with very large D2,  RHC: Mildly Reduced CO-CI (4.7-2.64).  LVEDP 20 mmHg.  PAP 27/6 mmHg with PCWP 19 mmHg.  Interval History:   Kaylee Hansen is here today along with  her husband for post-cath follow-up.  Unfortunately, she really has not had any significant changes in her symptoms.  It does not seem like the chest pain is as prominent.  She still has her dyspnea but notes not nonspecific from baseline-she basically gets dyspneic with just any activity..  CV Review of Symptoms (Summary) Cardiovascular ROS: positive for - chest pain, dyspnea on exertion, orthopnea, paroxysmal nocturnal dyspnea, shortness of breath, and continued weakness and fatigue. negative for - edema, irregular heartbeat, loss of consciousness, palpitations, rapid heart rate, or syncope/near syncope, TIA or claudication (not walking enough to note claudication).  REVIEWED OF SYSTEMS   Review of Systems  Constitutional:  Positive for chills (Occasionally at night.) and malaise/fatigue. Negative for fever.  HENT:  Positive for congestion. Negative for hearing loss and sinus pain.   Respiratory:  Positive for cough, sputum production, shortness of breath and wheezing.  Negative for hemoptysis.        Left chronic coughing but still has wheezing and shortness of breath at baseline.  Certainly symptoms get worse with exertion.  Cardiovascular:  Negative for leg swelling (Minimal).  Gastrointestinal:  Positive for abdominal pain and constipation. Negative for blood in stool and melena.  Genitourinary:  Positive for urgency. Negative for dysuria, frequency and hematuria.  Musculoskeletal:  Positive for back pain and joint pain. Negative for myalgias.  Neurological:  Positive for dizziness, weakness and headaches. Negative for focal weakness.  Psychiatric/Behavioral:  Positive for memory loss. The patient is nervous/anxious and has insomnia.    Fatigue, cough, chest pain, arthralgias & myalgias ->  Noted most prominently.  I have reviewed and (if needed) personally updated the patient's problem list, medications, allergies, past medical and surgical history, social and family history.   PAST MEDICAL HISTORY   Past Medical History:  Diagnosis Date   Acute respiratory failure following trauma and surgery ([place]) 08/26/2012   Anxiety    Aortic insufficiency with aortic stenosis 04/2017   TTE December 2019: Mild AS with severe regurgitation.  Mild LA dilation.;;  TEE January 2016: Severe-type III aortic regurgitation (holodiastolic flow reversal in the a sending aorta & vena contracta =6.    CAD S/P percutaneous coronary angioplasty 11/2017   Proximal RCA PCI Synergy DES 3.5 mm x 16 mm (3.8 mm). Ost-mLM 30%. Ost-prox Cx 40%.; f/u Cath 03/2021: widely patent RCA stent. Ost-prox LCx 30%. Normal LAD-D2.   Collagenous colitis    Colon adenomas 2011   Constipation    Chronic abdominal pain and constipation   COPD (chronic obstructive pulmonary disease) (HCC)    Depression    Diverticulosis    Esophageal stricture 11/08/2012   Ulcer noted in 2010   GERD (gastroesophageal reflux disease)    Helicobacter pylori gastritis 2010   Pylera Tx   History of  cholecystectomy    Hx of appendectomy    Hx of cancer of lung 1999   Hx of hysterectomy    Hyperlipidemia    Hypothyroidism    Iron deficiency anemia due to chronic blood loss 10/01/2019   Low back pain    Non-STEMI (non-ST elevated myocardial infarction) ([place]) 06/2018   RCA PCI   Osteoarthritis of knee    bilateral knee   Seizures (HCC)    Stroke (HCC)    CVA, hx of 97   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   CT CTA CORONARY W/CA SCORE W/CM &/OR WO/CM  05/2017   Coronary calcium score 2.9. Intermediate risk. Unusual noncalcified plaque in RCA --FFR negative   LEFT HEART CATH AND CORONARY ANGIOGRAPHY N/A 06/28/2018   Procedure: LEFT HEART CATH AND CORONARY ANGIOGRAPHY;  Surgeon: Martinique, Peter M, MD;  Location: [place] [facility];  Service: Cardiovascular:  90% prox RCA -> DES PCI. Ost-mLM 30%. Ost-prox Cx 40%.   EF 55-65%. LVEDP 15 mmHg.    RIGHT HEART CATH N/A 07/11/2018   Procedure: RIGHT HEART CATH;  Surgeon: Leonie Man, MD;  Location: [place] [facility];;   Relatively normal Right Heart Cath pressures: PA pressure 26/14 mmHg-mean 20 mmHg; PCWP 13 mmHg.  RAP 6 mmHg, RVP 28/3 mmHg-EDP 10 mmHg. Severely reduced cardiac output and index by Fick: 3.63 and 2.07.   RIGHT/LEFT HEART CATH AND CORONARY ANGIOGRAPHY N/A 04/01/2021   Procedure: RIGHT/LEFT HEART CATH AND CORONARY ANGIOGRAPHY;  Surgeon: Leonie Man, MD;  Location: [place] [facility];; Very large-dominant RCA with widely patent stent.  LCx ost-prox 30%. Diminutive LAD with very large D2,.RHC: Mildly Reduced CO-CI (4.7-2.64).  LVEDP 20 mmHg.  PAP 27/6 mmHg with PCWP 19 mmHg.   TEE WITHOUT  CARDIOVERSION N/A 07/11/2018   Procedure: TRANSESOPHAGEAL ECHOCARDIOGRAM (TEE);  Surgeon: [name], MD;  Location: [facility];  Service: Cardiology;;  Severe aortic regurgitation-type III. (suggested by holodiastolic flow reversal and descending aorta-vena contracta 6 mm).   TRANSTHORACIC ECHOCARDIOGRAM  05/2018   EF 60-65%.  No R WMA.  GR 1 DD.  Mild aortic stenosis with severe regurgitation.  Mild MR..  Only mild LV dilation noted.   TRANSTHORACIC ECHOCARDIOGRAM  12/2018    EF 60-65%. Mild LVH. Gr1 DD. Mod AoV thickening. Mod-Severe AI, ~ mlid AS.    Immunization History  Administered Date(s) Administered   Fluad Quad(high Dose 65+) 04/03/2019, 04/29/2020, 03/10/2021   Influenza Split 03/27/2012   Influenza Whole 05/08/2007, 04/08/2009, 03/24/2011   Influenza, High Dose Seasonal PF 04/04/2017   Influenza,inj,Quad PF,6+ Mos 03/05/2013, 05/01/2014, 03/05/2015, 04/17/2016, 03/13/2018   PFIZER(Purple Top)SARS-COV-2 Vaccination 08/19/2019, 09/09/2019, 05/17/2020   Pneumococcal Conjugate-13 07/04/2016   Pneumococcal Polysaccharide-23 07/14/2015   Td 04/13/2016   Tdap 07/14/2015    MEDICATIONS/ALLERGIES   Current Meds  Medication Sig   albuterol (PROVENTIL HFA;VENTOLIN HFA) 108 (90 Base) MCG/ACT inhaler Inhale 2 puffs into the lungs every 6 (six) hours as needed for wheezing or shortness of breath.   albuterol (PROVENTIL) (2.5 MG/3ML) 0.083% nebulizer solution INHALE 1 VIAL BY NEBULIZATION EVERY 4 HOURS AS NEEDED FOR WHEEZING OR SHORTNESS OF BREATH.   ALPRAZolam (XANAX) 0.25 MG tablet Take 1 tablet (0.25 mg total) by mouth at bedtime as needed for anxiety.   aspirin EC 81 MG tablet Take 81 mg by mouth once. Swallow whole. Pre-cath   bisacodyl (DULCOLAX) 5 MG EC tablet Take 2 tablets (10 mg total) by mouth at bedtime. (Patient taking differently: Take 10 mg by mouth daily as needed.)   calcium carbonate (TUMS - DOSED IN MG ELEMENTAL CALCIUM) 500 MG chewable tablet Chew 1 tablet  by mouth daily as needed for indigestion or heartburn.   ciprofloxacin (CIPRO) 500 MG tablet ciprofloxacin 500 mg tablet  TAKE 1 TABLET BY MOUTH TWICE A DAY   clopidogrel (PLAVIX) 75  MG tablet TAKE 1 TABLET (75 MG TOTAL) BY MOUTH DAILY WITH BREAKFAST.   Coenzyme Q10 (COQ10) 100 MG CAPS Take 100 mg by mouth in the morning.   diltiazem (CARDIZEM CD) 360 MG 24 hr capsule Take 1 capsule (360 mg total) by mouth daily.   diphenhydrAMINE HCl, Sleep, (ZZZQUIL) 25 MG CAPS Take 50 mg by mouth at bedtime.   docusate sodium (COLACE) 100 MG capsule Take 100 mg by mouth daily as needed for mild constipation or moderate constipation.   fluconazole (DIFLUCAN) 150 MG tablet fluconazole 150 mg tablet  TAKE 1 TABLET (150 MG TOTAL) BY MOUTH ONCE FOR 1 DOSE.   furosemide (LASIX) 20 MG tablet Take 1 tablet (20 mg total) by mouth daily.   guaiFENesin (ROBITUSSIN) 100 MG/5ML liquid Take 200 mg by mouth 2 (two) times daily as needed for cough.   HYDROcodone-acetaminophen (NORCO) 10-325 MG tablet Take 1 tablet by mouth every 6 (six) hours as needed for severe pain. Please fill on or after 03/16/21   HYDROcodone-acetaminophen (NORCO) 10-325 MG tablet Take 1 tablet by mouth every 6 (six) hours as needed for severe pain. Please fill on or after 05/15/21   HYDROcodone-acetaminophen (NORCO) 10-325 MG tablet Take 1 tablet by mouth every 6 (six) hours as needed for up to 5 days for severe pain. (Patient taking differently: Take 1 tablet by mouth in the morning and at bedtime.)   hydroxypropyl methylcellulose / hypromellose (ISOPTO TEARS / GONIOVISC) 2.5 % ophthalmic solution Place 1 drop into both eyes 3 (three) times daily as needed for dry eyes.   ipratropium-albuterol (DUONEB) 0.5-2.5 (3) MG/3ML SOLN Take 3 mLs by nebulization every 4 (four) hours as needed.   isosorbide mononitrate (IMDUR) 120 MG 24 hr tablet TAKE 1 TABLET (120 MG TOTAL) BY MOUTH DAILY.   ketoconazole (NIZORAL) 2 % cream APPLY TO AFFECTED AREA EVERY DAY  (Patient taking differently: Apply 1 application topically daily as needed for irritation. APPLY TO AFFECTED AREA EVERY DAY)   levothyroxine (SYNTHROID) 150 MCG tablet TAKE 1 TABLET BY MOUTH EVERY DAY   methylPREDNISolone (MEDROL DOSEPAK) 4 MG TBPK tablet As directed   naloxegol oxalate (MOVANTIK) 12.5 MG TABS tablet Take 1 tablet (12.5 mg total) by mouth daily.   naproxen sodium (ALEVE) 220 MG tablet Take 220 mg by mouth daily as needed (For Headache).   nitroGLYCERIN (NITROSTAT) 0.4 MG SL tablet DISSOLVE ONE TABLET UNDER THE TONGUE EVERY 5 MINUTES AS NEEDED FOR CHEST PAIN.  DO NOT EXCEED A TOTAL OF 3 DOSES IN 15 MINUTES (Patient taking differently: DISSOLVE ONE TABLET UNDER THE TONGUE EVERY 5 MINUTES AS NEEDED FOR CHEST PAIN.  DO NOT EXCEED A TOTAL OF 3 DOSES IN 15 MINUTES)   OXYGEN Inhale 2.5 L/min into the lungs at bedtime.   pantoprazole (PROTONIX) 40 MG tablet Take 1 tablet (40 mg total) by mouth daily before breakfast. (Patient taking differently: Take 40 mg by mouth every evening.)   PARoxetine (PAXIL) 40 MG tablet TAKE 1 TABLET BY MOUTH EVERY DAY   phenytoin (DILANTIN) 100 MG ER capsule TAKE 100 MG EVERY MORNING AND 200 MG EVERY NIGHT.   promethazine (PHENERGAN) 25 MG tablet Take 1 tablet (25 mg total) by mouth every 8 (eight) hours as needed for nausea or vomiting.   Respiratory Therapy Supplies (FLUTTER) DEVI As directed   rosuvastatin (CRESTOR) 20 MG tablet TAKE 1 TABLET BY MOUTH EVERY DAY   SUMAtriptan (IMITREX) 100 MG tablet Take 1 tablet (100 mg total) by mouth once for 1  dose. May repeat in 2 hours if headache persists or recurs.   traZODone (DESYREL) 100 MG tablet TAKE 1 TABLET BY MOUTH EVERYDAY AT BEDTIME   Vitamin D, Ergocalciferol, (DRISDOL) 1.25 MG (50000 UNIT) CAPS capsule TAKE 1 CAPSULE (50,000 UNITS TOTAL) BY MOUTH EVERY MONDAY.    Allergies  Allergen Reactions   Nitrofuran Derivatives Other (See Comments)    confusion   Oxycodone Hcl Other (See Comments)    Knocked her  out for 6 days   Morphine Other (See Comments)    "Makes me go crazy."    Penicillins Hives    Has patient had a PCN reaction causing immediate rash, facial/tongue/throat swelling, SOB or lightheadedness with hypotension: unknown Has patient had a PCN reaction causing severe rash involving mucus membranes or skin necrosis: unknown Has patient had a PCN reaction that required hospitalization No Has patient had a PCN reaction occurring within the last 10 years: No If all of the above answers are "NO", then may proceed with Cephalosporin use.  Other reaction(s): Rash-Generalized   Tape Other (See Comments)    Skin turns red and burns    SOCIAL HISTORY/FAMILY HISTORY   Reviewed in Epic:  Pertinent findings:  Social History   Tobacco Use   Smoking status: Every Day    Packs/day: 0.50    Years: 48.00    Pack years: 24.00    Types: Cigarettes   Smokeless tobacco: Never   Tobacco comments:    09/21/20 8cigs/day.  Vaping Use   Vaping Use: Never used  Substance Use Topics   Alcohol use: No   Drug use: No   Social History   Social History Narrative   Married   2 children   No regular exercise          OBJCTIVE -PE, EKG, labs   Wt Readings from Last 3 Encounters:  04/12/21 160 lb (72.6 kg)  04/11/21 159 lb 6.4 oz (72.3 kg)  04/01/21 157 lb (71.2 kg)    Physical Exam: BP (!) 130/53 (BP Location: Right Arm, Patient Position: Sitting, Cuff Size: Normal)   Pulse 76   Ht 5\' 5"  (1.651 m)   Wt 159 lb 6.4 oz (72.3 kg)   SpO2 95%   BMI 26.53 kg/m  Physical Exam Vitals reviewed.  Constitutional:      General: She is not in acute distress (Moderate chronic distress, not truly lethargic, but borderline).    Appearance: She is ill-appearing (Chronically ill-appearing.  Wheelchair-bound.). She is not toxic-appearing.     Interventions: Nasal cannula in place.     Comments: Dense smell of cigarette smoke.  HENT:     Head: Atraumatic.     Comments: Seems somewhat  puffier-likely response to steroids. Neck:     Vascular: Carotid bruit (Bilateral), hepatojugular reflux and JVD present.  Cardiovascular:     Rate and Rhythm: Normal rate and regular rhythm. Occasional Extrasystoles are present.    Chest Wall: PMI is not displaced.     Pulses: Decreased pulses (Diffusely decreased pedal pulses.).     Heart sounds: S1 normal and S2 normal. Heart sounds are distant. Murmur heard.  High-pitched harsh midsystolic murmur is present with a grade of 2/6 at the upper right sternal border radiating to the neck.  High-pitched blowing decrescendo early diastolic murmur is present with a grade of 2/4 at the upper right sternal border radiating to the apex.    No friction rub. No gallop.     Comments: Normal S1 with faint/split S2  Pulmonary:     Effort: Accessory muscle usage, prolonged expiration and respiratory distress (Baseline) present.     Breath sounds: Wheezing and rhonchi present. No decreased breath sounds.     Comments: Baseline accessory muscle use, but no real increased work of breathing.  Nonlabored.  Diffuse coarse rhonchi and late expiratory wheeze Musculoskeletal:        General: Swelling (Trivial pedal/ankle) present.     Cervical back: Normal range of motion and neck supple.  Skin:    General: Skin is warm and dry.     Comments: Promus discoloration of face and lower legs.  Neurological:     Mental Status: She is oriented to person, place, and time.     Motor: Weakness (Still a wheelchair.  Very weak.) present.  Psychiatric:        Mood and Affect: Mood normal.        Behavior: Behavior is cooperative.        Thought Content: Thought content normal.     Comments: Still seems slow to respond.  Somewhat flat affect.  Very difficult historian.  Happy to hear about cardiac cath results.     Adult ECG Report  Rate: 76;  Rhythm: RSR' ~ Inc RBB - >Suggestive of RV conduction delay (Pulmonary pattern);    Narrative Interpretation: Stable from before,  but not normal.  Recent Labs: Stable labs Lab Results  Component Value Date   CHOL 128 06/29/2018   HDL 35 (L) 06/29/2018   LDLCALC 56 06/29/2018   LDLDIRECT 187.0 07/14/2015   TRIG 184 (H) 06/29/2018   CHOLHDL 3.7 06/29/2018   Lab Results  Component Value Date   CREATININE 0.88 04/12/2021   BUN 12 04/12/2021   NA 139 04/12/2021   K 4.1 04/12/2021   CL 101 04/12/2021   CO2 32 04/12/2021   CBC Latest Ref Rng & Units 04/12/2021 04/01/2021 04/01/2021  WBC 4.0 - 10.5 K/uL 6.2 - -  Hemoglobin 12.0 - 15.0 g/dL 10.3(L) 9.2(L) 9.5(L)  Hematocrit 36.0 - 46.0 % 33.0(L) 27.0(L) 28.0(L)  Platelets 150 - 400 K/uL 215 - -    Lab Results  Component Value Date   HGBA1C  09/17/2008    5.0 (NOTE)   The ADA recommends the following therapeutic goal for glycemic   control related to Hgb A1C measurement:   Goal of Therapy:   < 7.0% Hgb A1C   Reference: American Diabetes Association: Clinical Practice   Recommendations 2008, Diabetes Care,  2008, 31:(Suppl 1).   Lab Results  Component Value Date   TSH 1.84 01/01/2019    ==================================================  COVID-19 Education: The signs and symptoms of COVID-19 were discussed with the patient and how to seek care for testing (follow up with PCP or arrange E-visit).    I spent a total of 21  minutes with the patient spent in direct patient consultation.  Additional time spent with chart review  / charting (studies, outside notes, etc): 15 min (15) Total Time: 51 min  Current medicines are reviewed at length with the patient today.  (+/- concerns) multiple concerns noted above.  This visit occurred during the SARS-CoV-2 public health emergency.  Safety protocols were in place, including screening questions prior to the visit, additional usage of staff PPE, and extensive cleaning of exam room while observing appropriate contact time as indicated for disinfecting solutions.  Notice: This dictation was prepared with Dragon  dictation along with smart phrase technology. Any transcriptional errors that result from this process are unintentional  and may not be corrected upon review.  Patient Instructions / Medication Changes & Studies & Tests Ordered   Patient Instructions  Medication Instructions:    Starting tomorrow 04/12/21 take 2 tablets of lasix ( total 40 mg)  and Wednesday 04/13/21 , Friday 1021/22,  Monday 04/18/21, Wednesday 1026/22 ,and Friday 04/22/21.  All the other days take one tablet  daily lasix 20 mg.   Continue with 20 mg daily thereafter   Stop taking Plavix ( clopidogrel)   *If you need a refill on your cardiac medications before your next appointment, please call your pharmacy* Lab Work: Not needed    Testing/Procedures:  Will be schedule at [facility] [address] I Jan 2023 Your physician has requested that you have an echocardiogram. Echocardiography is a painless test that uses sound waves to create images of your heart. It provides your doctor with information about the size and shape of your heart and how well your heart's chambers and valves are working. This procedure takes approximately one hour. There are no restrictions for this procedure.   Follow-Up: At [facility], you and your health needs are our priority.  As part of our continuing mission to provide you with exceptional heart care, we have created designated Provider Care Teams.  These Care Teams include your primary Cardiologist (physician) and Advanced Practice Providers (APPs -  Physician Assistants and Nurse Practitioners) who all work together to provide you with the care you need, when you need it.     Your next appointment:   6 month(s)  The format for your next appointment:   In Person  Provider:   [name], MD   Other Instructions   Ask Dr Marin Olp and Your GI doctor - bleeding maybe coming from Peptic ulcer - if so may stop aspirin and restart plavix 75 mg  from a heart  standpoint.   Studies Ordered:   Orders Placed This Encounter  Procedures   EKG 12-Lead   ECHOCARDIOGRAM COMPLETE      [name], M.D., M.S. Interventional Cardiologist   Pager # [phone] Phone # [phone] [address] [place], [place] [address]   Thank you for choosing Heartcare at [facility]!!

## 2021-04-11 ENCOUNTER — Other Ambulatory Visit: Payer: Self-pay

## 2021-04-11 ENCOUNTER — Other Ambulatory Visit: Payer: Self-pay | Admitting: Internal Medicine

## 2021-04-11 ENCOUNTER — Encounter: Payer: Self-pay | Admitting: Cardiology

## 2021-04-11 ENCOUNTER — Ambulatory Visit: Payer: Medicare HMO | Admitting: Cardiology

## 2021-04-11 VITALS — BP 130/53 | HR 76 | Ht 65.0 in | Wt 159.4 lb

## 2021-04-11 DIAGNOSIS — D500 Iron deficiency anemia secondary to blood loss (chronic): Secondary | ICD-10-CM

## 2021-04-11 DIAGNOSIS — I351 Nonrheumatic aortic (valve) insufficiency: Secondary | ICD-10-CM

## 2021-04-11 DIAGNOSIS — R0782 Intercostal pain: Secondary | ICD-10-CM

## 2021-04-11 DIAGNOSIS — I25119 Atherosclerotic heart disease of native coronary artery with unspecified angina pectoris: Secondary | ICD-10-CM

## 2021-04-11 DIAGNOSIS — I10 Essential (primary) hypertension: Secondary | ICD-10-CM | POA: Diagnosis not present

## 2021-04-11 DIAGNOSIS — I208 Other forms of angina pectoris: Secondary | ICD-10-CM | POA: Diagnosis not present

## 2021-04-11 DIAGNOSIS — E785 Hyperlipidemia, unspecified: Secondary | ICD-10-CM

## 2021-04-11 DIAGNOSIS — Z955 Presence of coronary angioplasty implant and graft: Secondary | ICD-10-CM

## 2021-04-11 MED ORDER — ASPIRIN EC 81 MG PO TBEC: 81.0000 mg | DELAYED_RELEASE_TABLET | Freq: Every day | ORAL | 11 refills | Status: DC

## 2021-04-11 NOTE — Patient Instructions (Addendum)
Medication Instructions:    Starting tomorrow 04/12/21 take 2 tablets of lasix ( total 40 mg)  and Wednesday 04/13/21 , Friday 1021/22,  Monday 04/18/21, Wednesday 1026/22 ,and Friday 04/22/21.  All the other days take one tablet  daily lasix 20 mg.   Continue with 20 mg daily thereafter   Stop taking Plavix ( clopidogrel)   *If you need a refill on your cardiac medications before your next appointment, please call your pharmacy* Lab Work: Not needed    Testing/Procedures:  Will be schedule at [facility] [address] I Jan 2023 Your physician has requested that you have an echocardiogram. Echocardiography is a painless test that uses sound waves to create images of your heart. It provides your doctor with information about the size and shape of your heart and how well your heart's chambers and valves are working. This procedure takes approximately one hour. There are no restrictions for this procedure.   Follow-Up: At [facility], you and your health needs are our priority.  As part of our continuing mission to provide you with exceptional heart care, we have created designated Provider Care Teams.  These Care Teams include your primary Cardiologist (physician) and Advanced Practice Providers (APPs -  Physician Assistants and Nurse Practitioners) who all work together to provide you with the care you need, when you need it.     Your next appointment:   6 month(s)  The format for your next appointment:   In Person  Provider:   [name], MD   Other Instructions   Ask Dr Marin Olp and Your GI doctor - bleeding maybe coming from Peptic ulcer - if so may stop aspirin and restart plavix 75 mg  from a heart standpoint.

## 2021-04-12 ENCOUNTER — Inpatient Hospital Stay: Payer: Medicare HMO

## 2021-04-12 ENCOUNTER — Inpatient Hospital Stay (HOSPITAL_BASED_OUTPATIENT_CLINIC_OR_DEPARTMENT_OTHER): Payer: Medicare HMO | Admitting: Family

## 2021-04-12 ENCOUNTER — Encounter: Payer: Self-pay | Admitting: Family

## 2021-04-12 VITALS — BP 119/35 | HR 77 | Temp 98.6°F | Resp 20 | Wt 160.0 lb

## 2021-04-12 DIAGNOSIS — D649 Anemia, unspecified: Secondary | ICD-10-CM | POA: Diagnosis not present

## 2021-04-12 DIAGNOSIS — C3490 Malignant neoplasm of unspecified part of unspecified bronchus or lung: Secondary | ICD-10-CM

## 2021-04-12 DIAGNOSIS — C3412 Malignant neoplasm of upper lobe, left bronchus or lung: Secondary | ICD-10-CM | POA: Diagnosis not present

## 2021-04-12 DIAGNOSIS — D500 Iron deficiency anemia secondary to blood loss (chronic): Secondary | ICD-10-CM

## 2021-04-12 LAB — CMP (CANCER CENTER ONLY)
ALT: 11 U/L (ref 0–44)
AST: 13 U/L — ABNORMAL LOW (ref 15–41)
Albumin: 3.6 g/dL (ref 3.5–5.0)
Alkaline Phosphatase: 126 U/L (ref 38–126)
Anion gap: 6 (ref 5–15)
BUN: 12 mg/dL (ref 8–23)
CO2: 32 mmol/L (ref 22–32)
Calcium: 9.4 mg/dL (ref 8.9–10.3)
Chloride: 101 mmol/L (ref 98–111)
Creatinine: 0.88 mg/dL (ref 0.44–1.00)
GFR, Estimated: >60 mL/min (ref >60)
Glucose, Bld: 113 mg/dL — ABNORMAL HIGH (ref 70–99)
Potassium: 4.1 mmol/L (ref 3.5–5.1)
Sodium: 139 mmol/L (ref 135–145)
Total Bilirubin: 0.2 mg/dL — ABNORMAL LOW (ref 0.3–1.2)
Total Protein: 6.5 g/dL (ref 6.5–8.1)

## 2021-04-12 LAB — CBC WITH DIFFERENTIAL (CANCER CENTER ONLY)
Abs Immature Granulocytes: 0.03 10*3/uL (ref 0.00–0.07)
Basophils Absolute: 0.1 10*3/uL (ref 0.0–0.1)
Basophils Relative: 1 %
Eosinophils Absolute: 0.1 10*3/uL (ref 0.0–0.5)
Eosinophils Relative: 2 %
HCT: 33 % — ABNORMAL LOW (ref 36.0–46.0)
Hemoglobin: 10.3 g/dL — ABNORMAL LOW (ref 12.0–15.0)
Immature Granulocytes: 1 %
Lymphocytes Relative: 20 %
Lymphs Abs: 1.2 10*3/uL (ref 0.7–4.0)
MCH: 30.2 pg (ref 26.0–34.0)
MCHC: 31.2 g/dL (ref 30.0–36.0)
MCV: 96.8 fL (ref 80.0–100.0)
Monocytes Absolute: 0.4 10*3/uL (ref 0.1–1.0)
Monocytes Relative: 7 %
Neutro Abs: 4.3 10*3/uL (ref 1.7–7.7)
Neutrophils Relative %: 69 %
Platelet Count: 215 10*3/uL (ref 150–400)
RBC: 3.41 MIL/uL — ABNORMAL LOW (ref 3.87–5.11)
RDW: 15.7 % — ABNORMAL HIGH (ref 11.5–15.5)
WBC Count: 6.2 10*3/uL (ref 4.0–10.5)
nRBC: 0 % (ref 0.0–0.2)

## 2021-04-12 LAB — RETICULOCYTES
Immature Retic Fract: 17.6 % — ABNORMAL HIGH (ref 2.3–15.9)
RBC.: 3.34 MIL/uL — ABNORMAL LOW (ref 3.87–5.11)
Retic Count, Absolute: 76.8 10*3/uL (ref 19.0–186.0)
Retic Ct Pct: 2.3 % (ref 0.4–3.1)

## 2021-04-12 NOTE — Progress Notes (Signed)
Hematology and Oncology Follow Up Visit  Kaylee Hansen [id number] 1956/06/08 65 y.o. 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 She  had a heart cath on 04/01/2021 which showed "stable single-vessel CAD with widely patent proximal RCA stent, only roughly 30% ostial LCx disease otherwise no significant CAD lesions". She states that her chest is still a, little sore form the procedure at times.  She is scheduled for annual ECHO on 07/06/2021.  She feels that her SOB is stable/at baseline. She uses 2 1/2 L supplemental O2 throughout the day as needed. She has a chronic cough with occasional green phlegm.  CT in August showed, "interval increase in conspicuity/size of a region of scarring versus bandlike atelectasis that is difficult to measure within the lingula that corresponds to a previous location of a known carcinoma". No fever, chills, n/v, rash, dizziness, palpitations, abdominal pain or changes in bowel or bladder habits.  She takes a stool softener as needed for constipation.  No blood loss noted. No abnormal bruising, no petechiae.  No swelling, tenderness, numbness or tingling in her extremities at this time. She states that she has occasional puffiness in her hands and feet that comes and goes.  No falls or syncope to report. She is in a wheelchair today.  Her appetite is ok and she is doing her best to stay well hydrated. Her weight is stable at 160 lbs.   ECOG Performance Status: 2 - Symptomatic, <50% confined to bed  Medications:  Allergies as of 04/12/2021       Reactions   Nitrofuran Derivatives Other (See Comments)   confusion   Oxycodone Hcl Other (See Comments)   Knocked her out for 6 days   Morphine Other (See  Comments)   "Makes me go crazy."    Penicillins Hives   Has patient had a PCN reaction causing immediate rash, facial/tongue/throat swelling, SOB or lightheadedness with hypotension: unknown Has patient had a PCN reaction causing severe rash involving mucus membranes or skin necrosis: unknown Has patient had a PCN reaction that required hospitalization No Has patient had a PCN reaction occurring within the last 10 years: No If all of the above answers are "NO", then may proceed with Cephalosporin use. Other reaction(s): Rash-Generalized   Tape Other (See Comments)   Skin turns red and burns        Medication List        Accurate as of April 12, 2021  1:24 PM. If you have any questions, ask your nurse or doctor.          albuterol 108 (90 Base) MCG/ACT inhaler Commonly known as: VENTOLIN HFA Inhale 2 puffs into the lungs every 6 (six) hours as needed for wheezing or shortness of breath.   albuterol (2.5 MG/3ML) 0.083% nebulizer solution Commonly known as: PROVENTIL INHALE 1 VIAL BY NEBULIZATION EVERY 4 HOURS AS NEEDED FOR WHEEZING OR SHORTNESS OF BREATH.   ALPRAZolam 0.25 MG tablet Commonly known as: XANAX Take 1 tablet (0.25 mg total) by mouth at bedtime as needed for anxiety.   aspirin EC 81 MG tablet Take 1 tablet (81 mg total) by mouth daily.   bisacodyl 5 MG EC tablet Commonly known as:  Dulcolax Take 2 tablets (10 mg total) by mouth at bedtime. What changed:  when to take this reasons to take this   calcium carbonate 500 MG chewable tablet Commonly known as: TUMS - dosed in mg elemental calcium Chew 1 tablet by mouth daily as needed for indigestion or heartburn.   ciprofloxacin 500 MG tablet Commonly known as: CIPRO ciprofloxacin 500 mg tablet  TAKE 1 TABLET BY MOUTH TWICE A DAY   CoQ10 100 MG Caps Take 100 mg by mouth in the morning.   diltiazem 360 MG 24 hr capsule Commonly known as: Cardizem CD Take 1 capsule (360 mg total) by mouth daily.    docusate sodium 100 MG capsule Commonly known as: COLACE Take 100 mg by mouth daily as needed for mild constipation or moderate constipation.   fluconazole 150 MG tablet Commonly known as: DIFLUCAN fluconazole 150 mg tablet  TAKE 1 TABLET (150 MG TOTAL) BY MOUTH ONCE FOR 1 DOSE.   [name] As directed   furosemide 20 MG tablet Commonly known as: LASIX Take 1 tablet (20 mg total) by mouth daily.   guaiFENesin 100 MG/5ML liquid Commonly known as: ROBITUSSIN Take 200 mg by mouth 2 (two) times daily as needed for cough.   HYDROcodone-acetaminophen 10-325 MG tablet Commonly known as: NORCO Take 1 tablet by mouth every 6 (six) hours as needed for severe pain. Please fill on or after 03/16/21 What changed: Another medication with the same name was changed. Make sure you understand how and when to take each.   HYDROcodone-acetaminophen 10-325 MG tablet Commonly known as: NORCO Take 1 tablet by mouth every 6 (six) hours as needed for severe pain. Please fill on or after 05/15/21 What changed: Another medication with the same name was changed. Make sure you understand how and when to take each.   HYDROcodone-acetaminophen 10-325 MG tablet Commonly known as: NORCO Take 1 tablet by mouth every 6 (six) hours as needed for up to 5 days for severe pain. What changed: when to take this   hydroxypropyl methylcellulose / hypromellose 2.5 % ophthalmic solution Commonly known as: ISOPTO TEARS / GONIOVISC Place 1 drop into both eyes 3 (three) times daily as needed for dry eyes.   ipratropium-albuterol 0.5-2.5 (3) MG/3ML Soln Commonly known as: DUONEB Take 3 mLs by nebulization every 4 (four) hours as needed.   isosorbide mononitrate 120 MG 24 hr tablet Commonly known as: IMDUR TAKE 1 TABLET (120 MG TOTAL) BY MOUTH DAILY.   ketoconazole 2 % cream Commonly known as: NIZORAL APPLY TO AFFECTED AREA EVERY DAY What changed:  how much to take how to take this when to take this reasons  to take this   levothyroxine 150 MCG tablet Commonly known as: SYNTHROID TAKE 1 TABLET BY MOUTH EVERY DAY   methylPREDNISolone 4 MG Tbpk tablet Commonly known as: MEDROL DOSEPAK As directed   naloxegol oxalate 12.5 MG Tabs tablet Commonly known as: MOVANTIK Take 1 tablet (12.5 mg total) by mouth daily.   naproxen sodium 220 MG tablet Commonly known as: ALEVE Take 220 mg by mouth daily as needed (For Headache).   nitroGLYCERIN 0.4 MG SL tablet Commonly known as: NITROSTAT DISSOLVE ONE TABLET UNDER THE TONGUE EVERY 5 MINUTES AS NEEDED FOR CHEST PAIN.  DO NOT EXCEED A TOTAL OF 3 DOSES IN 15 MINUTES   OXYGEN Inhale 2.5 L/min into the lungs at bedtime.   pantoprazole 40 MG tablet Commonly known as: PROTONIX Take 1 tablet (40 mg total) by mouth daily before  breakfast. What changed: when to take this   PARoxetine 40 MG tablet Commonly known as: PAXIL TAKE 1 TABLET BY MOUTH EVERY DAY   phenytoin 100 MG ER capsule Commonly known as: DILANTIN TAKE 100 MG EVERY MORNING AND 200 MG EVERY NIGHT.   promethazine 25 MG tablet Commonly known as: PHENERGAN Take 1 tablet (25 mg total) by mouth every 8 (eight) hours as needed for nausea or vomiting.   rosuvastatin 20 MG tablet Commonly known as: CRESTOR TAKE 1 TABLET BY MOUTH EVERY DAY   SUMAtriptan 100 MG tablet Commonly known as: IMITREX Take 1 tablet (100 mg total) by mouth once for 1 dose. May repeat in 2 hours if headache persists or recurs.   traZODone 100 MG tablet Commonly known as: DESYREL TAKE 1 TABLET BY MOUTH EVERYDAY AT BEDTIME   Vitamin D (Ergocalciferol) 1.25 MG (50000 UNIT) Caps capsule Commonly known as: DRISDOL TAKE 1 CAPSULE (50,000 UNITS TOTAL) BY MOUTH EVERY MONDAY.   ZzzQuil 25 MG Caps Generic drug: diphenhydrAMINE HCl (Sleep) Take 50 mg by mouth at bedtime.        Allergies:  Allergies  Allergen Reactions   Nitrofuran Derivatives Other (See Comments)    confusion   Oxycodone Hcl Other (See  Comments)    Knocked her out for 6 days   Morphine Other (See Comments)    "Makes me go crazy."    Penicillins Hives    Has patient had a PCN reaction causing immediate rash, facial/tongue/throat swelling, SOB or lightheadedness with hypotension: unknown Has patient had a PCN reaction causing severe rash involving mucus membranes or skin necrosis: unknown Has patient had a PCN reaction that required hospitalization No Has patient had a PCN reaction occurring within the last 10 years: No If all of the above answers are "NO", then may proceed with Cephalosporin use.  Other reaction(s): Rash-Generalized   Tape Other (See Comments)    Skin turns red and burns    Past Medical History, Surgical history, Social history, and Family History were reviewed and updated.  Review of Systems: All other 10 point review of systems is negative.   Physical Exam:  vitals were not taken for this visit.   Wt Readings from Last 3 Encounters:  04/11/21 159 lb 6.4 oz (72.3 kg)  04/01/21 157 lb (71.2 kg)  03/22/21 156 lb (70.8 kg)    Ocular: Sclerae unicteric, pupils equal, round and reactive to light Ear-nose-throat: Oropharynx clear, dentition fair Lymphatic: No cervical or supraclavicular adenopathy Lungs course with ins/exp wheezes throughout  Heart regular rate and rhythm, no murmur appreciated Abd soft, nontender, positive bowel sounds MSK no focal spinal tenderness, no joint edema Neuro: non-focal, well-oriented, appropriate affect Breasts: Deferred   Lab Results  Component Value Date   WBC 6.2 04/12/2021   HGB 10.3 (L) 04/12/2021   HCT 33.0 (L) 04/12/2021   MCV 96.8 04/12/2021   PLT 215 04/12/2021   Lab Results  Component Value Date   FERRITIN 30 03/15/2021   IRON 37 (L) 03/15/2021   TIBC 276 03/15/2021   UIBC 240 03/15/2021   IRONPCTSAT 13 (L) 03/15/2021   Lab Results  Component Value Date   RETICCTPCT 2.3 04/12/2021   RBC 3.41 (L) 04/12/2021   RBC 3.34 (L) 04/12/2021    No results found for: KPAFRELGTCHN, LAMBDASER, KAPLAMBRATIO No results found for: IGGSERUM, IGA, IGMSERUM No results found for: TOTALPROTELP, ALBUMINELP, A1GS, A2GS, BETS, BETA2SER, GAMS, MSPIKE, SPEI   Chemistry      Component Value Date/Time  NA 139 04/01/2021 1034   NA 140 04/01/2021 1034   NA 144 07/08/2018 1059   K 4.4 04/01/2021 1034   K 4.3 04/01/2021 1034   CL 100 03/15/2021 0943   CO2 34 (H) 03/15/2021 0943   BUN 17 03/15/2021 0943   BUN 12 07/08/2018 1059   CREATININE 0.89 03/15/2021 0943      Component Value Date/Time   CALCIUM 9.3 03/15/2021 0943   ALKPHOS 118 03/15/2021 0943   AST 18 03/15/2021 0943   ALT 13 03/15/2021 0943   BILITOT 0.2 (L) 03/15/2021 0943       Impression and Plan: Kaylee Hansen is a very pleasant 65 yo caucasian female with clinical bronchogenic carcinoma and iron deficiency anemia.  She completed SBRT in May 2021.  We will get a PET scan to re evaluate prior to next follow-up.  Iron studies are pending. We will replace if needed.  Follow-up in 8 weeks.  They can contact our office with any questions or concerns.   Lottie Dawson, NP 10/18/20221:24 PM

## 2021-04-13 LAB — IRON AND TIBC
Iron: 74 ug/dL (ref 41–142)
Saturation Ratios: 35 % (ref 21–57)
TIBC: 215 ug/dL — ABNORMAL LOW (ref 236–444)
UIBC: 141 ug/dL (ref 120–384)

## 2021-04-13 LAB — FERRITIN: Ferritin: 213 ng/mL (ref 11–307)

## 2021-04-18 DIAGNOSIS — C3490 Malignant neoplasm of unspecified part of unspecified bronchus or lung: Secondary | ICD-10-CM | POA: Diagnosis not present

## 2021-04-26 ENCOUNTER — Encounter: Payer: Self-pay | Admitting: Cardiology

## 2021-04-27 ENCOUNTER — Encounter: Payer: Self-pay | Admitting: Cardiology

## 2021-04-27 NOTE — Assessment & Plan Note (Signed)
Echocardiograms have been relatively stable.  Unfortunately aortic regurgitation is not an indication for TAVR, and she would not likely be an open AVR candidate.  For now, continue medical management.  We will recheck echocardiogram in January timeframe which would be 6 months out.  Relatively reassuring right heart cath findings with LVEDP of only 20.  We will have her increase diuretic dosing over the next several days based on elevated LVEDP.  She will take 40 mg Monday Wednesday Friday of the next 2 weeks and then reduce to 20 mg daily. Sliding-scale Lasix.  Blood pressure stable.  Continue diltiazem for rate control and blood pressure.  Monitor closely for consideration of afterload reduction with ARB.

## 2021-04-27 NOTE — Assessment & Plan Note (Signed)
Blood pressures have been stable on diltiazem, furosemide and Imdur.  She has had borderline hypotension and renal issues in the past, therefore we are holding off on ACE or ARB, but if pressures do increase, in light of her.  Regurgitation, would probably have a low threshold to add low-dose ARB.

## 2021-04-27 NOTE — Assessment & Plan Note (Signed)
Thankfully, her multiple different types of chest pain all seem to be nonanginal in nature based on cardiac catheterization findings.  Continue current dose of diltiazem and Imdur for now.

## 2021-04-27 NOTE — Assessment & Plan Note (Addendum)
Confirmed stable coronary arteries with patent RCA stent with recent cath.  Because of her discomfort may be potentially GI related, we discussed that we can probably stop either aspirin or Plavix.  Since he is now about 3 years out from PCI, we can stop Plavix and just simply continue aspirin.  With preserved EF, appropriately on diltiazem given her COPD as well as a beta-blocker.  Monitor blood pressures closely with ferritin deficiency and consider the possibility of ARB for now we will continue current meds with Imdur and diltiazem.  Unlikely that her symptoms are related to angina. Plan:   Continue diltiazem and Imdur along with rosuvastatin.  Stop Plavix, continue aspirin

## 2021-04-27 NOTE — Assessment & Plan Note (Signed)
Has been statin intolerant, however was tolerating rosuvastatin 20 mg for the last year. Needs lipid panel follow-up.  Due in January.

## 2021-04-27 NOTE — Assessment & Plan Note (Signed)
Several different types of chest pain.  Clearly cellulitis costochondritis or musculoskeletal in general.  She may get some chest discomfort related to her COPD.  Unfortunately, my view is not a good test based on recent results.  We may actually do better with coronary CTA in the future

## 2021-04-27 NOTE — Assessment & Plan Note (Signed)
Chronic issue.  Plan to discontinue Plavix

## 2021-04-27 NOTE — Assessment & Plan Note (Signed)
Now over 2 years out from PCI of the RCA.  Plan to discontinue clopidogrel and continue aspirin, in an effort to avoid excess bleeding and GI upset.   Okay to hold aspirin 5 to 7 days preop for surgeries or procedures.

## 2021-05-03 ENCOUNTER — Telehealth: Payer: Self-pay

## 2021-05-03 NOTE — Progress Notes (Signed)
Chronic Care Management Pharmacy Assistant   Name: Kaylee Hansen  MRN: [id number] DOB: 1956/04/23  Reason for Encounter: Disease State -General Adherence Appointment: Telephone Aug 03, 2021 @ 10 am  Recent office visits:  03/10/21 Plotnikov (PCP) - Chronic obstructive pulmonary disease, unspecified COPD type. Flu injection. Start [place]).   Recent consult visits:  04/12/21 Eulas Post, NP (Oncology) - Malignant neoplasm of bronchus and lung. No med changes.   04/11/21 Harding (Cardiology) - Severe Stage C1 aortic regurgitation by prior echocardiography. EKG done. Take Aspirin 81 mg. D/c Plavix.  03/22/21 Ellyn Hack (Cardiology) - Coronary artery disease involving native coronary artery of native heart with angina pectoris. No med changes. EKG 12-lead.  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 No med changes.  03/01/21 O'Neal (Cardiology) - Chest pain, unspecified type. EKG done. No med changes.   Hospital visits:  Medication Reconciliation was completed by comparing discharge summary, patient's EMR and Pharmacy list, and upon discussion with patient.  Admitted to the hospital on 04/01/21 due to Intercostal pain.[name] Discharge date was 04/01/21. Discharged from [facility].    Medications that remain the same after Hospital Discharge:??  -All other medications will remain the same.    Medications: Outpatient Encounter Medications as of 05/03/2021  Medication Sig   albuterol (PROVENTIL HFA;VENTOLIN HFA) 108 (90 Base) MCG/ACT inhaler Inhale 2 puffs into the lungs every 6 (six) hours as needed for wheezing or shortness of breath.   albuterol (PROVENTIL) (2.5 MG/3ML) 0.083% nebulizer solution INHALE 1 VIAL BY NEBULIZATION EVERY 4 HOURS AS NEEDED FOR WHEEZING OR SHORTNESS OF BREATH.   ALPRAZolam (XANAX) 0.25 MG tablet Take 1 tablet (0.25 mg total) by mouth at bedtime as needed for anxiety.   aspirin EC 81 MG tablet Take 1 tablet (81 mg total) by  mouth daily.   bisacodyl (DULCOLAX) 5 MG EC tablet Take 2 tablets (10 mg total) by mouth at bedtime. (Patient taking differently: Take 10 mg by mouth daily as needed.)   calcium carbonate (TUMS - DOSED IN MG ELEMENTAL CALCIUM) 500 MG chewable tablet Chew 1 tablet by mouth daily as needed for indigestion or heartburn.   ciprofloxacin (CIPRO) 500 MG tablet ciprofloxacin 500 mg tablet  TAKE 1 TABLET BY MOUTH TWICE A DAY   Coenzyme Q10 (COQ10) 100 MG CAPS Take 100 mg by mouth in the morning.   diltiazem (CARDIZEM CD) 360 MG 24 hr capsule Take 1 capsule (360 mg total) by mouth daily.   diphenhydrAMINE HCl, Sleep, (ZZZQUIL) 25 MG CAPS Take 50 mg by mouth at bedtime.   docusate sodium (COLACE) 100 MG capsule Take 100 mg by mouth daily as needed for mild constipation or moderate constipation.   fluconazole (DIFLUCAN) 150 MG tablet fluconazole 150 mg tablet  TAKE 1 TABLET (150 MG TOTAL) BY MOUTH ONCE FOR 1 DOSE.   furosemide (LASIX) 20 MG tablet Take 1 tablet (20 mg total) by mouth daily.   guaiFENesin (ROBITUSSIN) 100 MG/5ML liquid Take 200 mg by mouth 2 (two) times daily as needed for cough.   HYDROcodone-acetaminophen (NORCO) 10-325 MG tablet Take 1 tablet by mouth every 6 (six) hours as needed for severe pain. Please fill on or after 03/16/21   HYDROcodone-acetaminophen (NORCO) 10-325 MG tablet Take 1 tablet by mouth every 6 (six) hours as needed for severe pain. Please fill on or after 05/15/21   HYDROcodone-acetaminophen (NORCO) 10-325 MG tablet Take 1 tablet by mouth every 6 (six) hours as needed for up  to 5 days for severe pain. (Patient taking differently: Take 1 tablet by mouth in the morning and at bedtime.)   hydroxypropyl methylcellulose / hypromellose (ISOPTO TEARS / GONIOVISC) 2.5 % ophthalmic solution Place 1 drop into both eyes 3 (three) times daily as needed for dry eyes.   ipratropium-albuterol (DUONEB) 0.5-2.5 (3) MG/3ML SOLN Take 3 mLs by nebulization every 4 (four) hours as needed.    isosorbide mononitrate (IMDUR) 120 MG 24 hr tablet TAKE 1 TABLET (120 MG TOTAL) BY MOUTH DAILY.   ketoconazole (NIZORAL) 2 % cream APPLY TO AFFECTED AREA EVERY DAY (Patient taking differently: Apply 1 application topically daily as needed for irritation. APPLY TO AFFECTED AREA EVERY DAY)   levothyroxine (SYNTHROID) 150 MCG tablet TAKE 1 TABLET BY MOUTH EVERY DAY   methylPREDNISolone (MEDROL DOSEPAK) 4 MG TBPK tablet As directed   naloxegol oxalate (MOVANTIK) 12.5 MG TABS tablet Take 1 tablet (12.5 mg total) by mouth daily.   naproxen sodium (ALEVE) 220 MG tablet Take 220 mg by mouth daily as needed (For Headache).   nitroGLYCERIN (NITROSTAT) 0.4 MG SL tablet DISSOLVE ONE TABLET UNDER THE TONGUE EVERY 5 MINUTES AS NEEDED FOR CHEST PAIN.  DO NOT EXCEED A TOTAL OF 3 DOSES IN 15 MINUTES (Patient taking differently: DISSOLVE ONE TABLET UNDER THE TONGUE EVERY 5 MINUTES AS NEEDED FOR CHEST PAIN.  DO NOT EXCEED A TOTAL OF 3 DOSES IN 15 MINUTES)   OXYGEN Inhale 2.5 L/min into the lungs at bedtime.   pantoprazole (PROTONIX) 40 MG tablet Take 1 tablet (40 mg total) by mouth daily before breakfast. (Patient taking differently: Take 40 mg by mouth every evening.)   PARoxetine (PAXIL) 40 MG tablet TAKE 1 TABLET BY MOUTH EVERY DAY   phenytoin (DILANTIN) 100 MG ER capsule TAKE 100 MG EVERY MORNING AND 200 MG EVERY NIGHT.   promethazine (PHENERGAN) 25 MG tablet Take 1 tablet (25 mg total) by mouth every 8 (eight) hours as needed for nausea or vomiting.   Respiratory Therapy Supplies (FLUTTER) DEVI As directed   rosuvastatin (CRESTOR) 20 MG tablet TAKE 1 TABLET BY MOUTH EVERY DAY   SUMAtriptan (IMITREX) 100 MG tablet Take 1 tablet (100 mg total) by mouth once for 1 dose. May repeat in 2 hours if headache persists or recurs.   traZODone (DESYREL) 100 MG tablet TAKE 1 TABLET BY MOUTH EVERYDAY AT BEDTIME   Vitamin D, Ergocalciferol, (DRISDOL) 1.25 MG (50000 UNIT) CAPS capsule TAKE 1 CAPSULE (50,000 UNITS TOTAL) BY MOUTH  EVERY MONDAY.   No facility-administered encounter medications on file as of 05/03/2021.   Have you had any problems recently with your health? Patient husband stated she was recently in the hospital and now requires oxygen 24 hrs.  Have you had any problems with your pharmacy? Patient states no problems with pharmacy.   What issues or side effects are you having with your medications? Patient states no issues or side effects.  What would you like me to pass along to Lois Huxley, CPP for them to help you with?  Patient states nothing at this time and acknowledged the February appointment.  What can we do to take care of you better? Patient husband states he needs help getting portable oxygen tanks so he can have to take her out to her appointments.    Care Gaps Colonoscopy - 10/28/2009 Diabetic Foot Exam - Mammogram - 02/02/2020 Ophthalmology - NA Dexa Scan - NA Annual Well Visit - NA Micro albumin - NA Hemoglobin A1c - NA  Star Rating Drugs: Rosuvastatin - filled 06/04/21 [place], [place] Clinical Pharmacists Assistant [phone]

## 2021-05-04 ENCOUNTER — Ambulatory Visit: Payer: Medicare HMO

## 2021-05-11 ENCOUNTER — Ambulatory Visit (INDEPENDENT_AMBULATORY_CARE_PROVIDER_SITE_OTHER): Payer: Medicare HMO | Admitting: Internal Medicine

## 2021-05-11 ENCOUNTER — Encounter: Payer: Self-pay | Admitting: Internal Medicine

## 2021-05-11 ENCOUNTER — Other Ambulatory Visit: Payer: Self-pay

## 2021-05-11 VITALS — BP 126/48 | HR 78 | Temp 98.7°F | Ht 65.0 in

## 2021-05-11 DIAGNOSIS — N3000 Acute cystitis without hematuria: Secondary | ICD-10-CM

## 2021-05-11 MED ORDER — CEPHALEXIN 500 MG PO CAPS: 500.0000 mg | ORAL_CAPSULE | Freq: Two times a day (BID) | ORAL | 0 refills | Status: DC

## 2021-05-11 NOTE — Patient Instructions (Signed)
Take the antibiotic as prescribed.  Take tylenol if needed.     Increase your water intake.   Call if no improvement     Urinary Tract Infection, Adult A urinary tract infection (UTI) is an infection of any part of the urinary tract, which includes the kidneys, ureters, bladder, and urethra. These organs make, store, and get rid of urine in the body. UTI can be a bladder infection (cystitis) or kidney infection (pyelonephritis). What are the causes? This infection may be caused by fungi, viruses, or bacteria. Bacteria are the most common cause of UTIs. This condition can also be caused by repeated incomplete emptying of the bladder during urination. What increases the risk? This condition is more likely to develop if:  You ignore your need to urinate or hold urine for long periods of time.  You do not empty your bladder completely during urination.  You wipe back to front after urinating or having a bowel movement, if you are female.  You are uncircumcised, if you are female.  You are constipated.  You have a urinary catheter that stays in place (indwelling).  You have a weak defense (immune) system.  You have a medical condition that affects your bowels, kidneys, or bladder.  You have diabetes.  You take antibiotic medicines frequently or for long periods of time, and the antibiotics no longer work well against certain types of infections (antibiotic resistance).  You take medicines that irritate your urinary tract.  You are exposed to chemicals that irritate your urinary tract.  You are female.  What are the signs or symptoms? Symptoms of this condition include:  Fever.  Frequent urination or passing small amounts of urine frequently.  Needing to urinate urgently.  Pain or burning with urination.  Urine that smells bad or unusual.  Cloudy urine.  Pain in the lower abdomen or back.  Trouble urinating.  Blood in the urine.  Vomiting or being less hungry than  normal.  Diarrhea or abdominal pain.  Vaginal discharge, if you are female.  How is this diagnosed? This condition is diagnosed with a medical history and physical exam. You will also need to provide a urine sample to test your urine. Other tests may be done, including:  Blood tests.  Sexually transmitted disease (STD) testing.  If you have had more than one UTI, a cystoscopy or imaging studies may be done to determine the cause of the infections. How is this treated? Treatment for this condition often includes a combination of two or more of the following:  Antibiotic medicine.  Other medicines to treat less common causes of UTI.  Over-the-counter medicines to treat pain.  Drinking enough water to stay hydrated.  Follow these instructions at home:  Take over-the-counter and prescription medicines only as told by your health care provider.  If you were prescribed an antibiotic, take it as told by your health care provider. Do not stop taking the antibiotic even if you start to feel better.  Avoid alcohol, caffeine, tea, and carbonated beverages. They can irritate your bladder.  Drink enough fluid to keep your urine clear or pale yellow.  Keep all follow-up visits as told by your health care provider. This is important.  Make sure to: ? Empty your bladder often and completely. Do not hold urine for long periods of time. ? Empty your bladder before and after sex. ? Wipe from front to back after a bowel movement if you are female. Use each tissue one time when you   wipe. Contact a health care provider if:  You have back pain.  You have a fever.  You feel nauseous or vomit.  Your symptoms do not get better after 3 days.  Your symptoms go away and then return. Get help right away if:  You have severe back pain or lower abdominal pain.  You are vomiting and cannot keep down any medicines or water. This information is not intended to replace advice given to you by  your health care provider. Make sure you discuss any questions you have with your health care provider. Document Released: 03/22/2005 Document Revised: 11/24/2015 Document Reviewed: 05/03/2015 Elsevier Interactive Patient Education  2018 Else[employer] Inc.   
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
## 2021-05-12 ENCOUNTER — Telehealth: Payer: Self-pay

## 2021-05-12 NOTE — Chronic Care Management (AMB) (Signed)
Chronic Care Management Pharmacy Assistant   Name: DELILIAH SPRANGER  MRN: [id number] DOB: 01-14-1956   Reason for Encounter: Disease State   Conditions to be addressed/monitored: General Adherence   Recent office visits:  05/11/21 Binnie Rail, MD-PCP (Acute cystitis without hematuria) med changes: cephalexin 500 mg  03/10/21 Plotnikov (PCP) - Chronic obstructive pulmonary disease, unspecified COPD type. Flu injection. Start [place]).   Recent consult visits:  04/12/21 Eulas Post, NP (Oncology) - Malignant neoplasm of bronchus and lung. No med changes.    04/11/21 Harding (Cardiology) - Severe Stage C1 aortic regurgitation by prior echocardiography. EKG done. Take Aspirin 81 mg. D/c Plavix.   03/22/21 Ellyn Hack (Cardiology) - Coronary artery disease involving native coronary artery of native heart with angina pectoris. No med changes. EKG 12-lead.   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 No med changes.   03/01/21 O'Neal (Cardiology) - Chest pain, unspecified type. EKG done. No med changes  Hospital visits:  Medication Reconciliation was completed by comparing discharge summary, patient's EMR and Pharmacy list, and upon discussion with patient.  Admitted to the hospital on 04/01/21 due to Intercostal pain.[name] Discharge date was 04/01/21. Discharged from [facility].   Medications that remain the same after Hospital Discharge:??  -All other medications will remain the same.    Medications that remain the same after Hospital Discharge:??  -All other medications will remain the same.    Medications: Outpatient Encounter Medications as of 05/12/2021  Medication Sig   albuterol (PROVENTIL HFA;VENTOLIN HFA) 108 (90 Base) MCG/ACT inhaler Inhale 2 puffs into the lungs every 6 (six) hours as needed for wheezing or shortness of breath.   albuterol (PROVENTIL) (2.5 MG/3ML) 0.083% nebulizer solution INHALE 1 VIAL BY NEBULIZATION EVERY 4 HOURS  AS NEEDED FOR WHEEZING OR SHORTNESS OF BREATH.   ALPRAZolam (XANAX) 0.25 MG tablet Take 1 tablet (0.25 mg total) by mouth at bedtime as needed for anxiety.   aspirin EC 81 MG tablet Take 1 tablet (81 mg total) by mouth daily.   bisacodyl (DULCOLAX) 5 MG EC tablet Take 2 tablets (10 mg total) by mouth at bedtime. (Patient taking differently: Take 10 mg by mouth daily as needed.)   calcium carbonate (TUMS - DOSED IN MG ELEMENTAL CALCIUM) 500 MG chewable tablet Chew 1 tablet by mouth daily as needed for indigestion or heartburn.   cephALEXin (KEFLEX) 500 MG capsule Take 1 capsule (500 mg total) by mouth 2 (two) times daily.   Coenzyme Q10 (COQ10) 100 MG CAPS Take 100 mg by mouth in the morning.   diltiazem (CARDIZEM CD) 360 MG 24 hr capsule Take 1 capsule (360 mg total) by mouth daily.   diphenhydrAMINE HCl, Sleep, (ZZZQUIL) 25 MG CAPS Take 50 mg by mouth at bedtime.   docusate sodium (COLACE) 100 MG capsule Take 100 mg by mouth daily as needed for mild constipation or moderate constipation.   fluconazole (DIFLUCAN) 150 MG tablet fluconazole 150 mg tablet  TAKE 1 TABLET (150 MG TOTAL) BY MOUTH ONCE FOR 1 DOSE.   furosemide (LASIX) 20 MG tablet Take 1 tablet (20 mg total) by mouth daily.   guaiFENesin (ROBITUSSIN) 100 MG/5ML liquid Take 200 mg by mouth 2 (two) times daily as needed for cough.   HYDROcodone-acetaminophen (NORCO) 10-325 MG tablet Take 1 tablet by mouth every 6 (six) hours as needed for severe pain. Please fill on or after 03/16/21   HYDROcodone-acetaminophen (NORCO) 10-325 MG tablet Take 1 tablet by mouth  every 6 (six) hours as needed for severe pain. Please fill on or after 05/15/21   HYDROcodone-acetaminophen (NORCO) 10-325 MG tablet Take 1 tablet by mouth every 6 (six) hours as needed for up to 5 days for severe pain. (Patient taking differently: Take 1 tablet by mouth in the morning and at bedtime.)   hydroxypropyl methylcellulose / hypromellose (ISOPTO TEARS / GONIOVISC) 2.5 %  ophthalmic solution Place 1 drop into both eyes 3 (three) times daily as needed for dry eyes.   ipratropium-albuterol (DUONEB) 0.5-2.5 (3) MG/3ML SOLN Take 3 mLs by nebulization every 4 (four) hours as needed.   isosorbide mononitrate (IMDUR) 120 MG 24 hr tablet TAKE 1 TABLET (120 MG TOTAL) BY MOUTH DAILY.   ketoconazole (NIZORAL) 2 % cream APPLY TO AFFECTED AREA EVERY DAY (Patient taking differently: Apply 1 application topically daily as needed for irritation. APPLY TO AFFECTED AREA EVERY DAY)   levothyroxine (SYNTHROID) 150 MCG tablet TAKE 1 TABLET BY MOUTH EVERY DAY   methylPREDNISolone (MEDROL DOSEPAK) 4 MG TBPK tablet As directed   naloxegol oxalate (MOVANTIK) 12.5 MG TABS tablet Take 1 tablet (12.5 mg total) by mouth daily.   naproxen sodium (ALEVE) 220 MG tablet Take 220 mg by mouth daily as needed (For Headache).   nitroGLYCERIN (NITROSTAT) 0.4 MG SL tablet DISSOLVE ONE TABLET UNDER THE TONGUE EVERY 5 MINUTES AS NEEDED FOR CHEST PAIN.  DO NOT EXCEED A TOTAL OF 3 DOSES IN 15 MINUTES (Patient taking differently: DISSOLVE ONE TABLET UNDER THE TONGUE EVERY 5 MINUTES AS NEEDED FOR CHEST PAIN.  DO NOT EXCEED A TOTAL OF 3 DOSES IN 15 MINUTES)   OXYGEN Inhale 2.5 L/min into the lungs at bedtime.   pantoprazole (PROTONIX) 40 MG tablet Take 1 tablet (40 mg total) by mouth daily before breakfast. (Patient taking differently: Take 40 mg by mouth every evening.)   PARoxetine (PAXIL) 40 MG tablet TAKE 1 TABLET BY MOUTH EVERY DAY   phenytoin (DILANTIN) 100 MG ER capsule TAKE 100 MG EVERY MORNING AND 200 MG EVERY NIGHT.   promethazine (PHENERGAN) 25 MG tablet Take 1 tablet (25 mg total) by mouth every 8 (eight) hours as needed for nausea or vomiting.   Respiratory Therapy Supplies (FLUTTER) DEVI As directed   rosuvastatin (CRESTOR) 20 MG tablet TAKE 1 TABLET BY MOUTH EVERY DAY   SUMAtriptan (IMITREX) 100 MG tablet Take 1 tablet (100 mg total) by mouth once for 1 dose. May repeat in 2 hours if headache  persists or recurs.   traZODone (DESYREL) 100 MG tablet TAKE 1 TABLET BY MOUTH EVERYDAY AT BEDTIME   Vitamin D, Ergocalciferol, (DRISDOL) 1.25 MG (50000 UNIT) CAPS capsule TAKE 1 CAPSULE (50,000 UNITS TOTAL) BY MOUTH EVERY MONDAY.   No facility-administered encounter medications on file as of 05/12/2021.   Have you had any problems recently with your health?Spoke with patient husband who states that she does not have any new health issues  Have you had any problems with your pharmacy?He states that so far she has not had any problems with getting her medications or the cost of medications from the pharmacy  What issues or side effects are you having with your medications?He states that she does not have any side effects from medications, she did at one point have a reaction to Keflex but has been resolved  What would you like me to pass along to [facility] for them to help you with? He states that she is doing well so far  What can we do  to take care of you better? Not at this time  Care Gaps: Colonoscopy -10/28/09 Diabetic Foot Exam -NA Mammogram -02/02/20 Ophthalmology -NA Dexa Scan - NA Annual Well Visit - NA Micro albumin -NA Hemoglobin A1c - NA  Star Rating Drugs: Rosuvastatin - filled 03/07/21 Clancy Forest Clinical Pharmacist Assistant [phone]

## 2021-05-16 ENCOUNTER — Other Ambulatory Visit: Payer: Self-pay | Admitting: Cardiology

## 2021-05-19 DIAGNOSIS — C3490 Malignant neoplasm of unspecified part of unspecified bronchus or lung: Secondary | ICD-10-CM | POA: Diagnosis not present

## 2021-05-22 DIAGNOSIS — Z20822 Contact with and (suspected) exposure to covid-19: Secondary | ICD-10-CM | POA: Diagnosis not present

## 2021-05-22 DIAGNOSIS — J189 Pneumonia, unspecified organism: Secondary | ICD-10-CM | POA: Diagnosis not present

## 2021-05-22 DIAGNOSIS — I517 Cardiomegaly: Secondary | ICD-10-CM | POA: Diagnosis not present

## 2021-05-22 DIAGNOSIS — J9811 Atelectasis: Secondary | ICD-10-CM | POA: Diagnosis not present

## 2021-05-22 DIAGNOSIS — R059 Cough, unspecified: Secondary | ICD-10-CM | POA: Diagnosis not present

## 2021-05-22 DIAGNOSIS — R062 Wheezing: Secondary | ICD-10-CM | POA: Diagnosis not present

## 2021-05-22 DIAGNOSIS — Z743 Need for continuous supervision: Secondary | ICD-10-CM | POA: Diagnosis not present

## 2021-05-22 DIAGNOSIS — R0789 Other chest pain: Secondary | ICD-10-CM | POA: Diagnosis not present

## 2021-05-22 DIAGNOSIS — J181 Lobar pneumonia, unspecified organism: Secondary | ICD-10-CM | POA: Diagnosis not present

## 2021-05-22 DIAGNOSIS — R079 Chest pain, unspecified: Secondary | ICD-10-CM | POA: Diagnosis not present

## 2021-05-22 DIAGNOSIS — I10 Essential (primary) hypertension: Secondary | ICD-10-CM | POA: Diagnosis not present

## 2021-05-22 DIAGNOSIS — R0602 Shortness of breath: Secondary | ICD-10-CM | POA: Diagnosis not present

## 2021-05-22 DIAGNOSIS — R0781 Pleurodynia: Secondary | ICD-10-CM | POA: Diagnosis not present

## 2021-05-25 DIAGNOSIS — M1712 Unilateral primary osteoarthritis, left knee: Secondary | ICD-10-CM | POA: Diagnosis not present

## 2021-06-09 ENCOUNTER — Ambulatory Visit (INDEPENDENT_AMBULATORY_CARE_PROVIDER_SITE_OTHER): Payer: Medicare HMO | Admitting: Internal Medicine

## 2021-06-09 ENCOUNTER — Other Ambulatory Visit: Payer: Self-pay

## 2021-06-09 ENCOUNTER — Other Ambulatory Visit: Payer: Self-pay | Admitting: Internal Medicine

## 2021-06-09 ENCOUNTER — Encounter: Payer: Self-pay | Admitting: Internal Medicine

## 2021-06-09 DIAGNOSIS — C3490 Malignant neoplasm of unspecified part of unspecified bronchus or lung: Secondary | ICD-10-CM | POA: Diagnosis not present

## 2021-06-09 DIAGNOSIS — M5440 Lumbago with sciatica, unspecified side: Secondary | ICD-10-CM

## 2021-06-09 DIAGNOSIS — Z720 Tobacco use: Secondary | ICD-10-CM | POA: Diagnosis not present

## 2021-06-09 DIAGNOSIS — J441 Chronic obstructive pulmonary disease with (acute) exacerbation: Secondary | ICD-10-CM | POA: Diagnosis not present

## 2021-06-09 DIAGNOSIS — G8929 Other chronic pain: Secondary | ICD-10-CM | POA: Diagnosis not present

## 2021-06-09 DIAGNOSIS — J449 Chronic obstructive pulmonary disease, unspecified: Secondary | ICD-10-CM

## 2021-06-09 MED ORDER — HYDROCODONE-ACETAMINOPHEN 10-325 MG PO TABS: 1.0000 | ORAL_TABLET | Freq: Four times a day (QID) | ORAL | 0 refills | Status: DC | PRN

## 2021-06-09 MED ORDER — DOXYCYCLINE HYCLATE 100 MG PO TABS
100.0000 mg | ORAL_TABLET | Freq: Two times a day (BID) | ORAL | 0 refills | Status: DC
Start: 2021-06-09 — End: 2021-06-25

## 2021-06-09 MED ORDER — METHYLPREDNISOLONE 4 MG PO TBPK: ORAL_TABLET | ORAL | 0 refills | Status: DC

## 2021-06-09 MED ORDER — METHYLPREDNISOLONE ACETATE 80 MG/ML IJ SUSP
80.0000 mg | Freq: Once | INTRAMUSCULAR | Status: AC
  Administered 2021-06-09: 80 mg via INTRAMUSCULAR

## 2021-06-09 NOTE — Progress Notes (Signed)
Subjective:  Patient ID: Kaylee Hansen, female    DOB: 08/04/1955  Age: 65 y.o. MRN: [id number]  CC: Follow-up (3 month f/u)   HPI Reve L [place] presents for LBP, HAs, COPD, anxiety F/u on CAP 2 wks ago - treated; c/o cough  Outpatient Medications Prior to Visit  Medication Sig Dispense Refill   albuterol (PROVENTIL HFA;VENTOLIN HFA) 108 (90 Base) MCG/ACT inhaler Inhale 2 puffs into the lungs every 6 (six) hours as needed for wheezing or shortness of breath. 1 Inhaler 5   albuterol (PROVENTIL) (2.5 MG/3ML) 0.083% nebulizer solution INHALE 1 VIAL BY NEBULIZATION EVERY 4 HOURS AS NEEDED FOR WHEEZING OR SHORTNESS OF BREATH. 150 mL 5   ALPRAZolam (XANAX) 0.25 MG tablet Take 1 tablet (0.25 mg total) by mouth at bedtime as needed for anxiety. 90 tablet 2   aspirin EC 81 MG tablet Take 1 tablet (81 mg total) by mouth daily. 30 tablet 11   bisacodyl (DULCOLAX) 5 MG EC tablet Take 2 tablets (10 mg total) by mouth at bedtime. (Patient taking differently: Take 10 mg by mouth daily as needed.) 30 tablet    calcium carbonate (TUMS - DOSED IN MG ELEMENTAL CALCIUM) 500 MG chewable tablet Chew 1 tablet by mouth daily as needed for indigestion or heartburn.     Coenzyme Q10 (COQ10) 100 MG CAPS Take 100 mg by mouth in the morning.     diltiazem (CARDIZEM CD) 360 MG 24 hr capsule Take 1 capsule (360 mg total) by mouth daily. 90 capsule 3   diphenhydrAMINE HCl, Sleep, (ZZZQUIL) 25 MG CAPS Take 50 mg by mouth at bedtime.     docusate sodium (COLACE) 100 MG capsule Take 100 mg by mouth daily as needed for mild constipation or moderate constipation.     fluconazole (DIFLUCAN) 150 MG tablet fluconazole 150 mg tablet  TAKE 1 TABLET (150 MG TOTAL) BY MOUTH ONCE FOR 1 DOSE.     furosemide (LASIX) 20 MG tablet Take 1 tablet (20 mg total) by mouth daily. 90 tablet 3   guaiFENesin (ROBITUSSIN) 100 MG/5ML liquid Take 200 mg by mouth 2 (two) times daily as needed for cough.     hydroxypropyl methylcellulose /  hypromellose (ISOPTO TEARS / GONIOVISC) 2.5 % ophthalmic solution Place 1 drop into both eyes 3 (three) times daily as needed for dry eyes.     ipratropium-albuterol (DUONEB) 0.5-2.5 (3) MG/3ML SOLN Take 3 mLs by nebulization every 4 (four) hours as needed. 360 mL 0   isosorbide mononitrate (IMDUR) 120 MG 24 hr tablet TAKE 1 TABLET (120 MG TOTAL) BY MOUTH DAILY. 90 tablet 2   ketoconazole (NIZORAL) 2 % cream APPLY TO AFFECTED AREA EVERY DAY (Patient taking differently: Apply 1 application topically daily as needed for irritation. APPLY TO AFFECTED AREA EVERY DAY) 45 g 1   levothyroxine (SYNTHROID) 150 MCG tablet TAKE 1 TABLET BY MOUTH EVERY DAY 90 tablet 3   naloxegol oxalate (MOVANTIK) 12.5 MG TABS tablet Take 1 tablet (12.5 mg total) by mouth daily. 30 tablet 0   naproxen sodium (ALEVE) 220 MG tablet Take 220 mg by mouth daily as needed (For Headache).     nitroGLYCERIN (NITROSTAT) 0.4 MG SL tablet DISSOLVE ONE TABLET UNDER THE TONGUE EVERY 5 MINUTES AS NEEDED FOR CHEST PAIN.  DO NOT EXCEED A TOTAL OF 3 DOSES IN 15 MINUTES (Patient taking differently: DISSOLVE ONE TABLET UNDER THE TONGUE EVERY 5 MINUTES AS NEEDED FOR CHEST PAIN.  DO NOT EXCEED A TOTAL OF 3 DOSES  IN 15 MINUTES) 25 tablet 6   OXYGEN Inhale 2.5 L/min into the lungs at bedtime.     pantoprazole (PROTONIX) 40 MG tablet Take 1 tablet (40 mg total) by mouth daily before breakfast. (Patient taking differently: Take 40 mg by mouth every evening.) 90 tablet 3   PARoxetine (PAXIL) 40 MG tablet TAKE 1 TABLET BY MOUTH EVERY DAY 90 tablet 3   phenytoin (DILANTIN) 100 MG ER capsule TAKE 100 MG EVERY MORNING AND 200 MG EVERY NIGHT. 270 capsule 3   promethazine (PHENERGAN) 25 MG tablet Take 1 tablet (25 mg total) by mouth every 8 (eight) hours as needed for nausea or vomiting. 30 tablet 0   Respiratory Therapy Supplies (FLUTTER) DEVI As directed 1 each 0   rosuvastatin (CRESTOR) 20 MG tablet TAKE 1 TABLET BY MOUTH EVERY DAY 90 tablet 3   SUMAtriptan  (IMITREX) 100 MG tablet MAY REPEAT IN 2 HOURS IF HEADACHE PERSISTS OR RECURS. 9 tablet 3   traZODone (DESYREL) 100 MG tablet TAKE 1 TABLET BY MOUTH EVERYDAY AT BEDTIME 90 tablet 1   Vitamin D, Ergocalciferol, (DRISDOL) 1.25 MG (50000 UNIT) CAPS capsule TAKE 1 CAPSULE (50,000 UNITS TOTAL) BY MOUTH EVERY MONDAY. 12 capsule 3   HYDROcodone-acetaminophen (NORCO) 10-325 MG tablet Take 1 tablet by mouth every 6 (six) hours as needed for severe pain. Please fill on or after 03/16/21 120 tablet 0   HYDROcodone-acetaminophen (NORCO) 10-325 MG tablet Take 1 tablet by mouth every 6 (six) hours as needed for severe pain. Please fill on or after 05/15/21 120 tablet 0   HYDROcodone-acetaminophen (NORCO) 10-325 MG tablet Take 1 tablet by mouth every 6 (six) hours as needed for up to 5 days for severe pain. (Patient taking differently: Take 1 tablet by mouth in the morning and at bedtime.) 120 tablet 0   cephALEXin (KEFLEX) 500 MG capsule Take 1 capsule (500 mg total) by mouth 2 (two) times daily. (Patient not taking: Reported on 06/09/2021) 14 capsule 0   methylPREDNISolone (MEDROL DOSEPAK) 4 MG TBPK tablet As directed (Patient not taking: Reported on 06/09/2021) 21 tablet 0   No facility-administered medications prior to visit.    ROS: Review of Systems  Constitutional:  Positive for fatigue. Negative for activity change, appetite change, chills and unexpected weight change.  HENT:  Negative for congestion, mouth sores and sinus pressure.   Eyes:  Negative for visual disturbance.  Respiratory:  Positive for cough and shortness of breath. Negative for chest tightness.   Gastrointestinal:  Negative for abdominal pain and nausea.  Genitourinary:  Negative for difficulty urinating, frequency and vaginal pain.  Musculoskeletal:  Positive for arthralgias, back pain and gait problem.  Skin:  Negative for pallor and rash.  Neurological:  Positive for weakness and headaches. Negative for dizziness, tremors and  numbness.  Psychiatric/Behavioral:  Positive for decreased concentration and dysphoric mood. Negative for confusion, sleep disturbance and suicidal ideas. The patient is nervous/anxious.    Objective:  BP (!) 130/52 (BP Location: Left Arm)    Pulse 78    Temp 98 F (36.7 C) (Oral)    Ht 5\' 5"  (1.651 m)    Wt 155 lb 12.8 oz (70.7 kg)    SpO2 92%    BMI 25.93 kg/m   BP Readings from Last 3 Encounters:  06/09/21 (!) 130/52  05/11/21 (!) 126/48  04/12/21 (!) 119/35    Wt Readings from Last 3 Encounters:  06/09/21 155 lb 12.8 oz (70.7 kg)  04/12/21 160 lb (72.6 kg)  04/11/21 159 lb 6.4 oz (72.3 kg)    Physical Exam Constitutional:      General: She is not in acute distress.    Appearance: She is well-developed. She is ill-appearing.  HENT:     Head: Normocephalic.     Right Ear: External ear normal.     Left Ear: External ear normal.     Nose: Nose normal.  Eyes:     General:        Right eye: No discharge.        Left eye: No discharge.     Conjunctiva/sclera: Conjunctivae normal.     Pupils: Pupils are equal, round, and reactive to light.  Neck:     Thyroid: No thyromegaly.     Vascular: No JVD.     Trachea: No tracheal deviation.  Cardiovascular:     Rate and Rhythm: Normal rate and regular rhythm.     Heart sounds: Normal heart sounds.  Pulmonary:     Effort: No respiratory distress.     Breath sounds: No stridor. Wheezing, rhonchi and rales present.  Abdominal:     General: Bowel sounds are normal. There is no distension.     Palpations: Abdomen is soft. There is no mass.     Tenderness: There is no abdominal tenderness. There is no guarding or rebound.  Musculoskeletal:        General: Tenderness present.     Cervical back: Normal range of motion and neck supple. No rigidity.  Lymphadenopathy:     Cervical: No cervical adenopathy.  Skin:    Findings: No erythema or rash.  Neurological:     Cranial Nerves: No cranial nerve deficit.     Motor: No abnormal  muscle tone.     Coordination: Coordination abnormal.     Gait: Gait abnormal.     Deep Tendon Reflexes: Reflexes normal.  Psychiatric:        Behavior: Behavior normal.  Looks tired In a w/c  Lab Results  Component Value Date   WBC 6.2 04/12/2021   HGB 10.3 (L) 04/12/2021   HCT 33.0 (L) 04/12/2021   PLT 215 04/12/2021   GLUCOSE 113 (H) 04/12/2021   CHOL 128 06/29/2018   TRIG 184 (H) 06/29/2018   HDL 35 (L) 06/29/2018   LDLDIRECT 187.0 07/14/2015   LDLCALC 56 06/29/2018   ALT 11 04/12/2021   AST 13 (L) 04/12/2021   NA 139 04/12/2021   K 4.1 04/12/2021   CL 101 04/12/2021   CREATININE 0.88 04/12/2021   BUN 12 04/12/2021   CO2 32 04/12/2021   TSH 1.84 01/01/2019   INR 0.9 09/22/2019   HGBA1C  09/17/2008    5.0 (NOTE)   The ADA recommends the following therapeutic goal for glycemic   control related to Hgb A1C measurement:   Goal of Therapy:   < 7.0% Hgb A1C   Reference: American Diabetes Association: Clinical Practice   Recommendations 2008, Diabetes Care,  2008, 31:(Suppl 1).    CARDIAC CATHETERIZATION  Result Date: 04/01/2021   Colon Flattery Cx to Prox Cx lesion is 30% stenosed.   Previously placed Prox RCA stent (unknown type) is  widely patent.   LV end diastolic pressure is moderately elevated.   There is no aortic valve stenosis. SUMMARY Stable single-vessel CAD with widely patent proximal RCA stent, only roughly 30% ostial LCx disease otherwise no significant CAD lesions. Diminutive LAD with very large second diagonal branch, and very large dominant RCA with wraparound PDA Mildly reduced  Cardiac Output and Index (4.7-2.64), But Normal Right Heart Cath Pressures and moderately increased LV filling pressures-PAP-mean 27/6 mmHg-19 mils mercury, PCWP 19 mm, LVEDP 20 mmHg. RECOMMENDATIONS Evaluate for other causes of likely nonanginal chest pain [name], MD   Assessment & Plan:   Problem List Items Addressed This Visit     Tobacco abuse disorder (Chronic)    No change       COPD (chronic obstructive pulmonary disease) ([place])    Appt w/Dr Loanne Drilling is pending Albuterol MDI/HHN Cont w/O2 at 2.5 l/min [place]      Relevant Medications   methylPREDNISolone (MEDROL DOSEPAK) 4 MG TBPK tablet   COPD with acute exacerbation (HCC)    Depo-medrol IM Dosepac Medrol Doxy      Relevant Medications   methylPREDNISolone (MEDROL DOSEPAK) 4 MG TBPK tablet   Low back pain    Chronic Continue w/Norco prn  Potential benefits of a long term opioids use as well as potential risks (i.e. addiction risk, apnea etc) and complications (i.e. Somnolence, constipation and others) were explained to the patient and were aknowledged. Narcane nasal prn Do not take Norco w/Xanax      Relevant Medications   methylPREDNISolone (MEDROL DOSEPAK) 4 MG TBPK tablet   HYDROcodone-acetaminophen (NORCO) 10-325 MG tablet   HYDROcodone-acetaminophen (NORCO) 10-325 MG tablet   HYDROcodone-acetaminophen (NORCO) 10-325 MG tablet   Malignant neoplasm of bronchus and lung (HCC)    F/u w/Dr Marin Olp Appt w/Dr Loanne Drilling is pending        Relevant Medications   methylPREDNISolone (MEDROL DOSEPAK) 4 MG TBPK tablet   doxycycline (VIBRA-TABS) 100 MG tablet      Meds ordered this encounter  Medications   methylPREDNISolone (MEDROL DOSEPAK) 4 MG TBPK tablet    Sig: As directed    Dispense:  21 tablet    Refill:  0   doxycycline (VIBRA-TABS) 100 MG tablet    Sig: Take 1 tablet (100 mg total) by mouth 2 (two) times daily.    Dispense:  28 tablet    Refill:  0   HYDROcodone-acetaminophen (NORCO) 10-325 MG tablet    Sig: Take 1 tablet by mouth every 6 (six) hours as needed for severe pain.    Dispense:  120 tablet    Refill:  0    Please fill on or after 08/13/21   HYDROcodone-acetaminophen (NORCO) 10-325 MG tablet    Sig: Take 1 tablet by mouth every 6 (six) hours as needed for severe pain.    Dispense:  120 tablet    Refill:  0    Please fill on or after 07/14/21   HYDROcodone-acetaminophen  (NORCO) 10-325 MG tablet    Sig: Take 1 tablet by mouth every 6 (six) hours as needed for up to 5 days for severe pain.    Dispense:  120 tablet    Refill:  0    Please fill on or after 06/14/21       Follow-up: Return in about 3 months (around 09/07/2021) for a follow-up visit.  Walker Kehr, MD

## 2021-06-09 NOTE — Assessment & Plan Note (Signed)
Chronic Continue w/Norco prn  Potential benefits of a long term opioids use as well as potential risks (i.e. addiction risk, apnea etc) and complications (i.e. Somnolence, constipation and others) were explained to the patient and were aknowledged. Narcane nasal prn Do not take Norco w/Xanax

## 2021-06-09 NOTE — Addendum Note (Signed)
Addended by: Earnstine Regal on: 06/09/2021 02:27 PM   Modules accepted: Orders

## 2021-06-09 NOTE — Assessment & Plan Note (Signed)
Depo-medrol IM Dosepac Medrol Doxy

## 2021-06-09 NOTE — Assessment & Plan Note (Signed)
No change 

## 2021-06-09 NOTE — Progress Notes (Addendum)
Pls cosign for depo 80mg  inj../lmb  Medical screening examination/treatment/procedure(s) were performed by non-physician practitioner and as supervising physician I was immediately available for consultation/collaboration.  I agree with above. Lew Dawes, MD

## 2021-06-09 NOTE — Assessment & Plan Note (Signed)
Appt w/Dr Loanne Drilling is pending Albuterol MDI/HHN Cont w/O2 at 2.5 l/min [place]

## 2021-06-09 NOTE — Assessment & Plan Note (Deleted)
Appt w/Dr Loanne Drilling is pending Albuterol MDI/HHN Cont w/O2 at 2.5 l/min [place]

## 2021-06-09 NOTE — Assessment & Plan Note (Addendum)
F/u w/Dr Marin Olp Appt w/Dr Loanne Drilling is pending

## 2021-06-10 ENCOUNTER — Other Ambulatory Visit: Payer: Self-pay | Admitting: Family

## 2021-06-10 DIAGNOSIS — D500 Iron deficiency anemia secondary to blood loss (chronic): Secondary | ICD-10-CM

## 2021-06-10 DIAGNOSIS — C3490 Malignant neoplasm of unspecified part of unspecified bronchus or lung: Secondary | ICD-10-CM

## 2021-06-13 ENCOUNTER — Encounter: Payer: Self-pay | Admitting: *Deleted

## 2021-06-13 ENCOUNTER — Telehealth: Payer: Self-pay | Admitting: *Deleted

## 2021-06-13 ENCOUNTER — Inpatient Hospital Stay: Payer: Medicare HMO | Attending: Hematology & Oncology | Admitting: Family

## 2021-06-13 ENCOUNTER — Other Ambulatory Visit: Payer: Self-pay

## 2021-06-13 ENCOUNTER — Inpatient Hospital Stay: Payer: Medicare HMO

## 2021-06-13 ENCOUNTER — Telehealth: Payer: Medicare HMO

## 2021-06-13 ENCOUNTER — Encounter: Payer: Self-pay | Admitting: Family

## 2021-06-13 VITALS — BP 124/33 | HR 78 | Temp 98.6°F | Resp 18 | Ht 65.0 in | Wt 155.0 lb

## 2021-06-13 DIAGNOSIS — C3412 Malignant neoplasm of upper lobe, left bronchus or lung: Secondary | ICD-10-CM | POA: Diagnosis not present

## 2021-06-13 DIAGNOSIS — R197 Diarrhea, unspecified: Secondary | ICD-10-CM

## 2021-06-13 DIAGNOSIS — D500 Iron deficiency anemia secondary to blood loss (chronic): Secondary | ICD-10-CM

## 2021-06-13 DIAGNOSIS — D509 Iron deficiency anemia, unspecified: Secondary | ICD-10-CM | POA: Diagnosis not present

## 2021-06-13 DIAGNOSIS — R1084 Generalized abdominal pain: Secondary | ICD-10-CM

## 2021-06-13 DIAGNOSIS — C3490 Malignant neoplasm of unspecified part of unspecified bronchus or lung: Secondary | ICD-10-CM

## 2021-06-13 LAB — CBC WITH DIFFERENTIAL (CANCER CENTER ONLY)
Abs Immature Granulocytes: 0.06 10*3/uL (ref 0.00–0.07)
Basophils Absolute: 0.1 10*3/uL (ref 0.0–0.1)
Basophils Relative: 1 %
Eosinophils Absolute: 0.2 10*3/uL (ref 0.0–0.5)
Eosinophils Relative: 3 %
HCT: 32 % — ABNORMAL LOW (ref 36.0–46.0)
Hemoglobin: 10 g/dL — ABNORMAL LOW (ref 12.0–15.0)
Immature Granulocytes: 1 %
Lymphocytes Relative: 16 %
Lymphs Abs: 1 10*3/uL (ref 0.7–4.0)
MCH: 30.6 pg (ref 26.0–34.0)
MCHC: 31.3 g/dL (ref 30.0–36.0)
MCV: 97.9 fL (ref 80.0–100.0)
Monocytes Absolute: 0.4 10*3/uL (ref 0.1–1.0)
Monocytes Relative: 6 %
Neutro Abs: 4.8 10*3/uL (ref 1.7–7.7)
Neutrophils Relative %: 73 %
Platelet Count: 237 10*3/uL (ref 150–400)
RBC: 3.27 MIL/uL — ABNORMAL LOW (ref 3.87–5.11)
RDW: 14.6 % (ref 11.5–15.5)
WBC Count: 6.5 10*3/uL (ref 4.0–10.5)
nRBC: 0 % (ref 0.0–0.2)

## 2021-06-13 LAB — CMP (CANCER CENTER ONLY)
ALT: 22 U/L (ref 0–44)
AST: 18 U/L (ref 15–41)
Albumin: 3.5 g/dL (ref 3.5–5.0)
Alkaline Phosphatase: 167 U/L — ABNORMAL HIGH (ref 38–126)
Anion gap: 6 (ref 5–15)
BUN: 13 mg/dL (ref 8–23)
CO2: 32 mmol/L (ref 22–32)
Calcium: 9.5 mg/dL (ref 8.9–10.3)
Chloride: 102 mmol/L (ref 98–111)
Creatinine: 1 mg/dL (ref 0.44–1.00)
GFR, Estimated: >60 mL/min (ref >60)
Glucose, Bld: 109 mg/dL — ABNORMAL HIGH (ref 70–99)
Potassium: 4.7 mmol/L (ref 3.5–5.1)
Sodium: 140 mmol/L (ref 135–145)
Total Bilirubin: 0.2 mg/dL — ABNORMAL LOW (ref 0.3–1.2)
Total Protein: 6.5 g/dL (ref 6.5–8.1)

## 2021-06-13 LAB — RETICULOCYTES
Immature Retic Fract: 15.5 % (ref 2.3–15.9)
RBC.: 3.26 MIL/uL — ABNORMAL LOW (ref 3.87–5.11)
Retic Count, Absolute: 51.8 10*3/uL (ref 19.0–186.0)
Retic Ct Pct: 1.6 % (ref 0.4–3.1)

## 2021-06-13 NOTE — Telephone Encounter (Signed)
°  Chronic Care Management   Follow Up Note   06/13/2021 Name: Kaylee Hansen MRN: [id number] DOB: 08/29/1955  Referred by: Cassandria Anger, MD Reason for referral : Chronic Care Management (CCM RN CM Follow Up telephone visit: Unsuccessful outreach attempt; COPD; lung CA)  An unsuccessful telephone outreach was attempted today. The patient was referred to the case management team for assistance with care management and care coordination.   Follow Up Plan:  A HIPPA compliant phone message was left for the patient providing contact information and requesting a return call Will place request with scheduling care guide to contact patient to re-schedule today's missed CCM RN follow up telephone appointment if I do not hear back from patient by end of day  Oneta Rack, RN, BSN, [place] [phone]: direct office

## 2021-06-13 NOTE — Telephone Encounter (Signed)
Per 06/13/21 los gave upcoming appointments - confirmed

## 2021-06-13 NOTE — Progress Notes (Signed)
Hematology and Oncology Follow Up Visit  Kaylee Hansen [id number] 05-17-1956 65 y.o. 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 She states that she is currently on a medrol dose pack and doxycycline for pneumonia. She has inspiratory and expiratory wheezes throughout on exam.  She has some SOB with exertion and takes a break to rest as needed.  Her PET scan was denied by insurance so we will have her do CT scans instead.  She denies fever, chills, n/v, rash, dizziness, chest pain or palpitations.  She has occasional episodes of dizziness and notes occasional falls with weakness and poor balance. Thankfully she denies injury.  She has diarrhea and abdominal discomfort at times. No blood loss noted. No bruising or petechiae.  She states that her appetite comes and goes. She is doing her best to stay well hydrated. Her weight is 155 lbs.    ECOG Performance Status: 2 - Symptomatic, <50% confined to bed  Medications:  Allergies as of 06/13/2021       Reactions   Nitrofuran Derivatives Other (See Comments)   confusion   Oxycodone Hcl Other (See Comments)   Knocked her out for 6 days   Morphine Other (See Comments)   "Makes me go crazy."    Penicillins Hives   Has patient had a PCN reaction causing immediate rash, facial/tongue/throat swelling, SOB or lightheadedness with hypotension: unknown Has patient had a PCN reaction causing severe rash involving mucus membranes or skin necrosis: unknown Has patient had a PCN reaction that required hospitalization No Has patient had a PCN reaction occurring within the last 10 years: No If all of the above answers are "NO", then may proceed with Cephalosporin use. Other reaction(s):  Rash-Generalized   Tape Other (See Comments)   Skin turns red and burns        Medication List        Accurate as of June 13, 2021  2:27 PM. If you have any questions, ask your nurse or doctor.          albuterol 108 (90 Base) MCG/ACT inhaler Commonly known as: VENTOLIN HFA Inhale 2 puffs into the lungs every 6 (six) hours as needed for wheezing or shortness of breath.   albuterol (2.5 MG/3ML) 0.083% nebulizer solution Commonly known as: PROVENTIL INHALE 1 VIAL BY NEBULIZATION EVERY 4 HOURS AS NEEDED FOR WHEEZING OR SHORTNESS OF BREATH.   ALPRAZolam 0.25 MG tablet Commonly known as: XANAX Take 1 tablet (0.25 mg total) by mouth at bedtime as needed for anxiety.   aspirin EC 81 MG tablet Take 1 tablet (81 mg total) by mouth daily.   bisacodyl 5 MG EC tablet Commonly known as: Dulcolax Take 2 tablets (10 mg total) by mouth at bedtime. What changed:  when to take this reasons to take this   calcium carbonate 500 MG chewable tablet Commonly known as: TUMS - dosed in mg elemental calcium Chew 1 tablet by mouth daily as needed for indigestion or heartburn.   CoQ10 100 MG Caps Take 100 mg by mouth in the morning.   diltiazem 360 MG 24 hr capsule Commonly known as: Cardizem CD Take 1 capsule (360 mg total) by mouth daily.  docusate sodium 100 MG capsule Commonly known as: COLACE Take 100 mg by mouth daily as needed for mild constipation or moderate constipation.   doxycycline 100 MG tablet Commonly known as: VIBRA-TABS Take 1 tablet (100 mg total) by mouth 2 (two) times daily.   fluconazole 150 MG tablet Commonly known as: DIFLUCAN fluconazole 150 mg tablet  TAKE 1 TABLET (150 MG TOTAL) BY MOUTH ONCE FOR 1 DOSE.   [name] As directed   furosemide 20 MG tablet Commonly known as: LASIX Take 1 tablet (20 mg total) by mouth daily.   guaiFENesin 100 MG/5ML liquid Commonly known as: ROBITUSSIN Take 200 mg by mouth 2 (two) times daily as needed for  cough.   HYDROcodone-acetaminophen 10-325 MG tablet Commonly known as: NORCO Take 1 tablet by mouth every 6 (six) hours as needed for severe pain.   HYDROcodone-acetaminophen 10-325 MG tablet Commonly known as: NORCO Take 1 tablet by mouth every 6 (six) hours as needed for severe pain.   HYDROcodone-acetaminophen 10-325 MG tablet Commonly known as: NORCO Take 1 tablet by mouth every 6 (six) hours as needed for up to 5 days for severe pain.   hydroxypropyl methylcellulose / hypromellose 2.5 % ophthalmic solution Commonly known as: ISOPTO TEARS / GONIOVISC Place 1 drop into both eyes 3 (three) times daily as needed for dry eyes.   ipratropium-albuterol 0.5-2.5 (3) MG/3ML Soln Commonly known as: DUONEB Take 3 mLs by nebulization every 4 (four) hours as needed.   isosorbide mononitrate 120 MG 24 hr tablet Commonly known as: IMDUR TAKE 1 TABLET (120 MG TOTAL) BY MOUTH DAILY.   ketoconazole 2 % cream Commonly known as: NIZORAL APPLY TO AFFECTED AREA EVERY DAY What changed:  how much to take how to take this when to take this reasons to take this   levothyroxine 150 MCG tablet Commonly known as: SYNTHROID TAKE 1 TABLET BY MOUTH EVERY DAY   methylPREDNISolone 4 MG Tbpk tablet Commonly known as: MEDROL DOSEPAK As directed   naloxegol oxalate 12.5 MG Tabs tablet Commonly known as: MOVANTIK Take 1 tablet (12.5 mg total) by mouth daily.   naproxen sodium 220 MG tablet Commonly known as: ALEVE Take 220 mg by mouth daily as needed (For Headache).   nitroGLYCERIN 0.4 MG SL tablet Commonly known as: NITROSTAT DISSOLVE ONE TABLET UNDER THE TONGUE EVERY 5 MINUTES AS NEEDED FOR CHEST PAIN.  DO NOT EXCEED A TOTAL OF 3 DOSES IN 15 MINUTES   OXYGEN Inhale 2.5 L/min into the lungs at bedtime.   pantoprazole 40 MG tablet Commonly known as: PROTONIX Take 1 tablet (40 mg total) by mouth daily before breakfast. What changed: when to take this   PARoxetine 40 MG tablet Commonly  known as: PAXIL TAKE 1 TABLET BY MOUTH EVERY DAY   phenytoin 100 MG ER capsule Commonly known as: DILANTIN TAKE 100 MG EVERY MORNING AND 200 MG EVERY NIGHT.   promethazine 25 MG tablet Commonly known as: PHENERGAN Take 1 tablet (25 mg total) by mouth every 8 (eight) hours as needed for nausea or vomiting.   rosuvastatin 20 MG tablet Commonly known as: CRESTOR TAKE 1 TABLET BY MOUTH EVERY DAY   SUMAtriptan 100 MG tablet Commonly known as: IMITREX MAY REPEAT IN 2 HOURS IF HEADACHE PERSISTS OR RECURS.   traZODone 100 MG tablet Commonly known as: DESYREL TAKE 1 TABLET BY MOUTH EVERYDAY AT BEDTIME   Vitamin D (Ergocalciferol) 1.25 MG (50000 UNIT) Caps capsule Commonly known as: DRISDOL TAKE 1 CAPSULE (50,000 UNITS TOTAL)  BY MOUTH EVERY MONDAY.   ZzzQuil 25 MG Caps Generic drug: diphenhydrAMINE HCl (Sleep) Take 50 mg by mouth at bedtime.        Allergies:  Allergies  Allergen Reactions   Nitrofuran Derivatives Other (See Comments)    confusion   Oxycodone Hcl Other (See Comments)    Knocked her out for 6 days   Morphine Other (See Comments)    "Makes me go crazy."    Penicillins Hives    Has patient had a PCN reaction causing immediate rash, facial/tongue/throat swelling, SOB or lightheadedness with hypotension: unknown Has patient had a PCN reaction causing severe rash involving mucus membranes or skin necrosis: unknown Has patient had a PCN reaction that required hospitalization No Has patient had a PCN reaction occurring within the last 10 years: No If all of the above answers are "NO", then may proceed with Cephalosporin use.  Other reaction(s): Rash-Generalized   Tape Other (See Comments)    Skin turns red and burns    Past Medical History, Surgical history, Social history, and Family History were reviewed and updated.  Review of Systems: All other 10 point review of systems is negative.   Physical Exam:  vitals were not taken for this visit.   Wt  Readings from Last 3 Encounters:  06/09/21 155 lb 12.8 oz (70.7 kg)  04/12/21 160 lb (72.6 kg)  04/11/21 159 lb 6.4 oz (72.3 kg)    Ocular: Sclerae unicteric, pupils equal, round and reactive to light Ear-nose-throat: Oropharynx clear, dentition fair Lymphatic: No cervical or supraclavicular adenopathy Lungs coarse with insp and exp wheezes throughout Heart regular rate and rhythm, no murmur appreciated Abd soft, nontender, positive bowel sounds MSK no focal spinal tenderness, no joint edema Neuro: non-focal, well-oriented, appropriate affect Breasts: Deferred   Lab Results  Component Value Date   WBC 6.5 06/13/2021   HGB 10.0 (L) 06/13/2021   HCT 32.0 (L) 06/13/2021   MCV 97.9 06/13/2021   PLT 237 06/13/2021   Lab Results  Component Value Date   FERRITIN 213 04/12/2021   IRON 74 04/12/2021   TIBC 215 (L) 04/12/2021   UIBC 141 04/12/2021   IRONPCTSAT 35 04/12/2021   Lab Results  Component Value Date   RETICCTPCT 1.6 06/13/2021   RBC 3.26 (L) 06/13/2021   No results found for: KPAFRELGTCHN, LAMBDASER, KAPLAMBRATIO No results found for: IGGSERUM, IGA, IGMSERUM No results found for: Odetta Pink, SPEI   Chemistry      Component Value Date/Time   NA 139 04/12/2021 1301   NA 144 07/08/2018 1059   K 4.1 04/12/2021 1301   CL 101 04/12/2021 1301   CO2 32 04/12/2021 1301   BUN 12 04/12/2021 1301   BUN 12 07/08/2018 1059   CREATININE 0.88 04/12/2021 1301      Component Value Date/Time   CALCIUM 9.4 04/12/2021 1301   ALKPHOS 126 04/12/2021 1301   AST 13 (L) 04/12/2021 1301   ALT 11 04/12/2021 1301   BILITOT 0.2 (L) 04/12/2021 1301       Impression and Plan: Ms. Boccio is a very pleasant 65 yo caucasian female with clinical bronchogenic carcinoma and iron deficiency anemia.  She completed SBRT in May 2021.  CT scans scheduled for 12.27.2022.  Iron studies are pending.  Follow-up in 3 months.   Lottie Dawson,  NP 12/19/20222:27 PM

## 2021-06-14 DIAGNOSIS — J441 Chronic obstructive pulmonary disease with (acute) exacerbation: Secondary | ICD-10-CM | POA: Diagnosis not present

## 2021-06-14 DIAGNOSIS — I700 Atherosclerosis of aorta: Secondary | ICD-10-CM | POA: Diagnosis not present

## 2021-06-14 DIAGNOSIS — R0789 Other chest pain: Secondary | ICD-10-CM | POA: Diagnosis not present

## 2021-06-14 DIAGNOSIS — J90 Pleural effusion, not elsewhere classified: Secondary | ICD-10-CM | POA: Diagnosis not present

## 2021-06-14 DIAGNOSIS — Z743 Need for continuous supervision: Secondary | ICD-10-CM | POA: Diagnosis not present

## 2021-06-14 DIAGNOSIS — I959 Hypotension, unspecified: Secondary | ICD-10-CM | POA: Diagnosis not present

## 2021-06-14 DIAGNOSIS — J9610 Chronic respiratory failure, unspecified whether with hypoxia or hypercapnia: Secondary | ICD-10-CM | POA: Diagnosis not present

## 2021-06-14 DIAGNOSIS — G8929 Other chronic pain: Secondary | ICD-10-CM | POA: Diagnosis not present

## 2021-06-14 DIAGNOSIS — J811 Chronic pulmonary edema: Secondary | ICD-10-CM | POA: Diagnosis not present

## 2021-06-14 DIAGNOSIS — M87051 Idiopathic aseptic necrosis of right femur: Secondary | ICD-10-CM | POA: Diagnosis not present

## 2021-06-14 DIAGNOSIS — E039 Hypothyroidism, unspecified: Secondary | ICD-10-CM | POA: Diagnosis not present

## 2021-06-14 DIAGNOSIS — I110 Hypertensive heart disease with heart failure: Secondary | ICD-10-CM | POA: Diagnosis not present

## 2021-06-14 DIAGNOSIS — C3490 Malignant neoplasm of unspecified part of unspecified bronchus or lung: Secondary | ICD-10-CM | POA: Diagnosis not present

## 2021-06-14 DIAGNOSIS — R079 Chest pain, unspecified: Secondary | ICD-10-CM | POA: Diagnosis not present

## 2021-06-14 DIAGNOSIS — I509 Heart failure, unspecified: Secondary | ICD-10-CM | POA: Diagnosis not present

## 2021-06-14 DIAGNOSIS — R062 Wheezing: Secondary | ICD-10-CM | POA: Diagnosis not present

## 2021-06-14 DIAGNOSIS — I517 Cardiomegaly: Secondary | ICD-10-CM | POA: Diagnosis not present

## 2021-06-14 DIAGNOSIS — J449 Chronic obstructive pulmonary disease, unspecified: Secondary | ICD-10-CM | POA: Diagnosis not present

## 2021-06-14 DIAGNOSIS — Z23 Encounter for immunization: Secondary | ICD-10-CM | POA: Diagnosis not present

## 2021-06-14 DIAGNOSIS — R69 Illness, unspecified: Secondary | ICD-10-CM | POA: Diagnosis not present

## 2021-06-14 DIAGNOSIS — J181 Lobar pneumonia, unspecified organism: Secondary | ICD-10-CM | POA: Diagnosis not present

## 2021-06-14 DIAGNOSIS — J9811 Atelectasis: Secondary | ICD-10-CM | POA: Diagnosis not present

## 2021-06-14 DIAGNOSIS — R0602 Shortness of breath: Secondary | ICD-10-CM | POA: Diagnosis not present

## 2021-06-14 LAB — IRON AND IRON BINDING CAPACITY (CC-WL,HP ONLY)
Iron: 73 ug/dL (ref 28–170)
Saturation Ratios: 31 % (ref 10.4–31.8)
TIBC: 238 ug/dL — ABNORMAL LOW (ref 250–450)
UIBC: 165 ug/dL (ref 148–442)

## 2021-06-14 LAB — FERRITIN: Ferritin: 70 ng/mL (ref 11–307)

## 2021-06-15 DIAGNOSIS — R079 Chest pain, unspecified: Secondary | ICD-10-CM | POA: Diagnosis not present

## 2021-06-15 DIAGNOSIS — J449 Chronic obstructive pulmonary disease, unspecified: Secondary | ICD-10-CM | POA: Diagnosis not present

## 2021-06-15 DIAGNOSIS — M87051 Idiopathic aseptic necrosis of right femur: Secondary | ICD-10-CM | POA: Diagnosis not present

## 2021-06-16 DIAGNOSIS — I517 Cardiomegaly: Secondary | ICD-10-CM | POA: Diagnosis not present

## 2021-06-16 DIAGNOSIS — I700 Atherosclerosis of aorta: Secondary | ICD-10-CM | POA: Diagnosis not present

## 2021-06-16 DIAGNOSIS — J181 Lobar pneumonia, unspecified organism: Secondary | ICD-10-CM | POA: Diagnosis not present

## 2021-06-16 DIAGNOSIS — J449 Chronic obstructive pulmonary disease, unspecified: Secondary | ICD-10-CM | POA: Diagnosis not present

## 2021-06-16 DIAGNOSIS — R0602 Shortness of breath: Secondary | ICD-10-CM | POA: Diagnosis not present

## 2021-06-16 DIAGNOSIS — J9811 Atelectasis: Secondary | ICD-10-CM | POA: Diagnosis not present

## 2021-06-16 DIAGNOSIS — J90 Pleural effusion, not elsewhere classified: Secondary | ICD-10-CM | POA: Diagnosis not present

## 2021-06-16 DIAGNOSIS — R062 Wheezing: Secondary | ICD-10-CM | POA: Diagnosis not present

## 2021-06-16 DIAGNOSIS — M87051 Idiopathic aseptic necrosis of right femur: Secondary | ICD-10-CM | POA: Diagnosis not present

## 2021-06-16 DIAGNOSIS — R079 Chest pain, unspecified: Secondary | ICD-10-CM | POA: Diagnosis not present

## 2021-06-16 DIAGNOSIS — J811 Chronic pulmonary edema: Secondary | ICD-10-CM | POA: Diagnosis not present

## 2021-06-17 DIAGNOSIS — J441 Chronic obstructive pulmonary disease with (acute) exacerbation: Secondary | ICD-10-CM | POA: Diagnosis not present

## 2021-06-17 DIAGNOSIS — Z23 Encounter for immunization: Secondary | ICD-10-CM | POA: Diagnosis not present

## 2021-06-17 DIAGNOSIS — I509 Heart failure, unspecified: Secondary | ICD-10-CM | POA: Diagnosis not present

## 2021-06-17 DIAGNOSIS — R079 Chest pain, unspecified: Secondary | ICD-10-CM | POA: Diagnosis not present

## 2021-06-17 DIAGNOSIS — R69 Illness, unspecified: Secondary | ICD-10-CM | POA: Diagnosis not present

## 2021-06-17 DIAGNOSIS — G8929 Other chronic pain: Secondary | ICD-10-CM | POA: Diagnosis not present

## 2021-06-17 DIAGNOSIS — M87051 Idiopathic aseptic necrosis of right femur: Secondary | ICD-10-CM | POA: Diagnosis not present

## 2021-06-17 DIAGNOSIS — J9610 Chronic respiratory failure, unspecified whether with hypoxia or hypercapnia: Secondary | ICD-10-CM | POA: Diagnosis not present

## 2021-06-17 DIAGNOSIS — J449 Chronic obstructive pulmonary disease, unspecified: Secondary | ICD-10-CM | POA: Diagnosis not present

## 2021-06-17 DIAGNOSIS — R0602 Shortness of breath: Secondary | ICD-10-CM | POA: Diagnosis not present

## 2021-06-17 DIAGNOSIS — I110 Hypertensive heart disease with heart failure: Secondary | ICD-10-CM | POA: Diagnosis not present

## 2021-06-17 DIAGNOSIS — C3490 Malignant neoplasm of unspecified part of unspecified bronchus or lung: Secondary | ICD-10-CM | POA: Diagnosis not present

## 2021-06-18 DIAGNOSIS — C3490 Malignant neoplasm of unspecified part of unspecified bronchus or lung: Secondary | ICD-10-CM | POA: Diagnosis not present

## 2021-06-21 ENCOUNTER — Telehealth: Payer: Self-pay

## 2021-06-21 ENCOUNTER — Ambulatory Visit (HOSPITAL_BASED_OUTPATIENT_CLINIC_OR_DEPARTMENT_OTHER): Payer: Medicare HMO

## 2021-06-21 DIAGNOSIS — E039 Hypothyroidism, unspecified: Secondary | ICD-10-CM | POA: Diagnosis not present

## 2021-06-21 DIAGNOSIS — D638 Anemia in other chronic diseases classified elsewhere: Secondary | ICD-10-CM | POA: Diagnosis not present

## 2021-06-21 DIAGNOSIS — M87051 Idiopathic aseptic necrosis of right femur: Secondary | ICD-10-CM | POA: Diagnosis not present

## 2021-06-21 DIAGNOSIS — J441 Chronic obstructive pulmonary disease with (acute) exacerbation: Secondary | ICD-10-CM | POA: Diagnosis not present

## 2021-06-21 DIAGNOSIS — M1990 Unspecified osteoarthritis, unspecified site: Secondary | ICD-10-CM | POA: Diagnosis not present

## 2021-06-21 DIAGNOSIS — R69 Illness, unspecified: Secondary | ICD-10-CM | POA: Diagnosis not present

## 2021-06-21 DIAGNOSIS — I509 Heart failure, unspecified: Secondary | ICD-10-CM | POA: Diagnosis not present

## 2021-06-21 DIAGNOSIS — I252 Old myocardial infarction: Secondary | ICD-10-CM | POA: Diagnosis not present

## 2021-06-21 DIAGNOSIS — I110 Hypertensive heart disease with heart failure: Secondary | ICD-10-CM | POA: Diagnosis not present

## 2021-06-21 NOTE — Telephone Encounter (Signed)
Patients husband called stating patient was in hospital this weekend and had her CT done while in the hospital and wanted to know if she needed to cancel her CT for today. Called patients husband back and he states she was at [facility] and they did provide the disc with the CT imaging, asked him to bring it to [place] for them to scan it in our system so we can see what all imaging they did and could see from that if Dr.Ennever wants any additional imaging. Until then CT for today can be cancelled. Called central scheduling and cancelled. Mr.Sellman will bring disc to [facility] today and will update him once it is scanned into pts chart

## 2021-06-22 ENCOUNTER — Telehealth: Payer: Self-pay

## 2021-06-22 DIAGNOSIS — J441 Chronic obstructive pulmonary disease with (acute) exacerbation: Secondary | ICD-10-CM | POA: Diagnosis not present

## 2021-06-22 DIAGNOSIS — I252 Old myocardial infarction: Secondary | ICD-10-CM | POA: Diagnosis not present

## 2021-06-22 DIAGNOSIS — I110 Hypertensive heart disease with heart failure: Secondary | ICD-10-CM | POA: Diagnosis not present

## 2021-06-22 DIAGNOSIS — M87051 Idiopathic aseptic necrosis of right femur: Secondary | ICD-10-CM | POA: Diagnosis not present

## 2021-06-22 DIAGNOSIS — I509 Heart failure, unspecified: Secondary | ICD-10-CM | POA: Diagnosis not present

## 2021-06-22 DIAGNOSIS — E039 Hypothyroidism, unspecified: Secondary | ICD-10-CM | POA: Diagnosis not present

## 2021-06-22 DIAGNOSIS — M1990 Unspecified osteoarthritis, unspecified site: Secondary | ICD-10-CM | POA: Diagnosis not present

## 2021-06-22 DIAGNOSIS — D638 Anemia in other chronic diseases classified elsewhere: Secondary | ICD-10-CM | POA: Diagnosis not present

## 2021-06-22 DIAGNOSIS — R69 Illness, unspecified: Secondary | ICD-10-CM | POA: Diagnosis not present

## 2021-06-22 NOTE — Telephone Encounter (Signed)
Verbals left for Kaylee Hansen on number left.  Also requested call back regarding health issues she wanted to discuss.  If she calls back please see what issues she wants to discuss and send to [facility] helping Dr. Alain Marion to address.

## 2021-06-22 NOTE — Telephone Encounter (Signed)
Ok Thx 

## 2021-06-22 NOTE — Telephone Encounter (Signed)
Kaylee Hansen ([id number]) calling to get verbal orders for HHA/OT.  HHA one time this week Then: HHA then 2 times a week x 4 weeks or until pt is better   Nursing 2 times a week x 3 weeks Then: Nursing 1 time a week x 6 weeks  PT/OT eval and treatment will call with recommendations.  [name] is requesting a call back to report some other health issues.

## 2021-06-23 NOTE — Telephone Encounter (Signed)
Rescheduled 07/01/21

## 2021-06-23 NOTE — Telephone Encounter (Signed)
Sharyn Lull calling in   Says patient having major interactions between meds paroxetine & trazodone

## 2021-06-24 DIAGNOSIS — E039 Hypothyroidism, unspecified: Secondary | ICD-10-CM | POA: Diagnosis not present

## 2021-06-24 DIAGNOSIS — I252 Old myocardial infarction: Secondary | ICD-10-CM | POA: Diagnosis not present

## 2021-06-24 DIAGNOSIS — I110 Hypertensive heart disease with heart failure: Secondary | ICD-10-CM | POA: Diagnosis not present

## 2021-06-24 DIAGNOSIS — I509 Heart failure, unspecified: Secondary | ICD-10-CM | POA: Diagnosis not present

## 2021-06-24 DIAGNOSIS — R69 Illness, unspecified: Secondary | ICD-10-CM | POA: Diagnosis not present

## 2021-06-24 DIAGNOSIS — D638 Anemia in other chronic diseases classified elsewhere: Secondary | ICD-10-CM | POA: Diagnosis not present

## 2021-06-24 DIAGNOSIS — M87051 Idiopathic aseptic necrosis of right femur: Secondary | ICD-10-CM | POA: Diagnosis not present

## 2021-06-24 DIAGNOSIS — M1990 Unspecified osteoarthritis, unspecified site: Secondary | ICD-10-CM | POA: Diagnosis not present

## 2021-06-24 DIAGNOSIS — J441 Chronic obstructive pulmonary disease with (acute) exacerbation: Secondary | ICD-10-CM | POA: Diagnosis not present

## 2021-06-24 NOTE — Telephone Encounter (Signed)
Lung whizzing all lobes After neb treatment.  O2 sat 93-96% on 2.5L  Pt have 2 opened packs of Steroids. Nurse encourages pt to take them 06/21/21. Pt states that they wont let her sleep.   Need Rx for continuous O2 at 2.5 L. Huey Romans will need a Rx and office note why she needs it for the O2. Fax [phone]  [name] will Fax note from PT.   Need Rx for new Nebulizer and supplies.  [name] [facility]) [phone]

## 2021-06-24 NOTE — Telephone Encounter (Signed)
Kaylee Hansen [facility] states patient has lost doxycycline antibiotic   Caller requesting provider to prescribed another rx  Informed caller provider is out until 06-28-2021, inquired   Encouraged caller to advise patient to schedule a vv at [place] get care now.

## 2021-06-25 MED ORDER — DOXYCYCLINE HYCLATE 100 MG PO TABS: 100.0000 mg | ORAL_TABLET | Freq: Two times a day (BID) | ORAL | 0 refills | Status: DC

## 2021-06-25 NOTE — Addendum Note (Signed)
Addended by: Cassandria Anger on: 06/25/2021 09:10 AM   Modules accepted: Orders

## 2021-06-25 NOTE — Telephone Encounter (Signed)
OK. Thx

## 2021-06-28 DIAGNOSIS — R69 Illness, unspecified: Secondary | ICD-10-CM | POA: Diagnosis not present

## 2021-06-28 DIAGNOSIS — M87051 Idiopathic aseptic necrosis of right femur: Secondary | ICD-10-CM | POA: Diagnosis not present

## 2021-06-28 DIAGNOSIS — E039 Hypothyroidism, unspecified: Secondary | ICD-10-CM | POA: Diagnosis not present

## 2021-06-28 DIAGNOSIS — J441 Chronic obstructive pulmonary disease with (acute) exacerbation: Secondary | ICD-10-CM | POA: Diagnosis not present

## 2021-06-28 DIAGNOSIS — I252 Old myocardial infarction: Secondary | ICD-10-CM | POA: Diagnosis not present

## 2021-06-28 DIAGNOSIS — M1990 Unspecified osteoarthritis, unspecified site: Secondary | ICD-10-CM | POA: Diagnosis not present

## 2021-06-28 DIAGNOSIS — I509 Heart failure, unspecified: Secondary | ICD-10-CM | POA: Diagnosis not present

## 2021-06-28 DIAGNOSIS — D638 Anemia in other chronic diseases classified elsewhere: Secondary | ICD-10-CM | POA: Diagnosis not present

## 2021-06-28 DIAGNOSIS — I110 Hypertensive heart disease with heart failure: Secondary | ICD-10-CM | POA: Diagnosis not present

## 2021-06-29 DIAGNOSIS — J441 Chronic obstructive pulmonary disease with (acute) exacerbation: Secondary | ICD-10-CM | POA: Diagnosis not present

## 2021-06-29 DIAGNOSIS — D638 Anemia in other chronic diseases classified elsewhere: Secondary | ICD-10-CM | POA: Diagnosis not present

## 2021-06-29 DIAGNOSIS — M87051 Idiopathic aseptic necrosis of right femur: Secondary | ICD-10-CM | POA: Diagnosis not present

## 2021-06-29 DIAGNOSIS — I110 Hypertensive heart disease with heart failure: Secondary | ICD-10-CM | POA: Diagnosis not present

## 2021-06-29 DIAGNOSIS — R69 Illness, unspecified: Secondary | ICD-10-CM | POA: Diagnosis not present

## 2021-06-29 DIAGNOSIS — I252 Old myocardial infarction: Secondary | ICD-10-CM | POA: Diagnosis not present

## 2021-06-29 DIAGNOSIS — I509 Heart failure, unspecified: Secondary | ICD-10-CM | POA: Diagnosis not present

## 2021-06-29 DIAGNOSIS — M1990 Unspecified osteoarthritis, unspecified site: Secondary | ICD-10-CM | POA: Diagnosis not present

## 2021-06-29 DIAGNOSIS — E039 Hypothyroidism, unspecified: Secondary | ICD-10-CM | POA: Diagnosis not present

## 2021-06-30 DIAGNOSIS — J441 Chronic obstructive pulmonary disease with (acute) exacerbation: Secondary | ICD-10-CM | POA: Diagnosis not present

## 2021-06-30 DIAGNOSIS — M1990 Unspecified osteoarthritis, unspecified site: Secondary | ICD-10-CM | POA: Diagnosis not present

## 2021-06-30 DIAGNOSIS — I509 Heart failure, unspecified: Secondary | ICD-10-CM | POA: Diagnosis not present

## 2021-06-30 DIAGNOSIS — R69 Illness, unspecified: Secondary | ICD-10-CM | POA: Diagnosis not present

## 2021-06-30 DIAGNOSIS — I110 Hypertensive heart disease with heart failure: Secondary | ICD-10-CM | POA: Diagnosis not present

## 2021-06-30 DIAGNOSIS — D638 Anemia in other chronic diseases classified elsewhere: Secondary | ICD-10-CM | POA: Diagnosis not present

## 2021-06-30 DIAGNOSIS — I252 Old myocardial infarction: Secondary | ICD-10-CM | POA: Diagnosis not present

## 2021-06-30 DIAGNOSIS — M87051 Idiopathic aseptic necrosis of right femur: Secondary | ICD-10-CM | POA: Diagnosis not present

## 2021-06-30 DIAGNOSIS — E039 Hypothyroidism, unspecified: Secondary | ICD-10-CM | POA: Diagnosis not present

## 2021-07-01 ENCOUNTER — Telehealth: Payer: Self-pay | Admitting: Internal Medicine

## 2021-07-01 ENCOUNTER — Ambulatory Visit (INDEPENDENT_AMBULATORY_CARE_PROVIDER_SITE_OTHER): Payer: Medicare HMO | Admitting: *Deleted

## 2021-07-01 DIAGNOSIS — J441 Chronic obstructive pulmonary disease with (acute) exacerbation: Secondary | ICD-10-CM | POA: Diagnosis not present

## 2021-07-01 DIAGNOSIS — I110 Hypertensive heart disease with heart failure: Secondary | ICD-10-CM | POA: Diagnosis not present

## 2021-07-01 DIAGNOSIS — I252 Old myocardial infarction: Secondary | ICD-10-CM | POA: Diagnosis not present

## 2021-07-01 DIAGNOSIS — M87051 Idiopathic aseptic necrosis of right femur: Secondary | ICD-10-CM | POA: Diagnosis not present

## 2021-07-01 DIAGNOSIS — C3490 Malignant neoplasm of unspecified part of unspecified bronchus or lung: Secondary | ICD-10-CM

## 2021-07-01 DIAGNOSIS — I509 Heart failure, unspecified: Secondary | ICD-10-CM | POA: Diagnosis not present

## 2021-07-01 DIAGNOSIS — R69 Illness, unspecified: Secondary | ICD-10-CM | POA: Diagnosis not present

## 2021-07-01 DIAGNOSIS — E039 Hypothyroidism, unspecified: Secondary | ICD-10-CM | POA: Diagnosis not present

## 2021-07-01 DIAGNOSIS — J449 Chronic obstructive pulmonary disease, unspecified: Secondary | ICD-10-CM

## 2021-07-01 DIAGNOSIS — M1990 Unspecified osteoarthritis, unspecified site: Secondary | ICD-10-CM | POA: Diagnosis not present

## 2021-07-01 DIAGNOSIS — D638 Anemia in other chronic diseases classified elsewhere: Secondary | ICD-10-CM | POA: Diagnosis not present

## 2021-07-01 NOTE — Patient Instructions (Signed)
Visit Information  Kaylee Hansen, thank you for taking time to talk with me today. Please don't hesitate to contact me if I can be of assistance to you before our next scheduled telephone appointment.  Below are the goals we discussed today:  Patient Self-Care Activities: Patient Kaylee Hansen will: Take medications as prescribed Attend all scheduled provider appointments Call pharmacy for medication refills Call provider office for new concerns or questions Continue working with home health team after your recent hospital visit Continue efforts to follow heart healthy, low salt, low cholesterol diet Develop and follow a rescue plan if symptoms flare up: please contact your care provider promptly if you believe your breathing is starting to get worse [place] job in quitting smoking!  Please continue your efforts to not smoke-- this is the single most important thing you can do to help with your breathing Keep all doctor and follow-up appointments- please make sure to communicate your healthcare needs and any problems you are having with your doctors and your care team Continue using your oxygen as prescribed Continue your efforts to prevent falls at home: use your walker and your electric wheelchair: falls can cause serious injuries for you and for your husband who assists you in getting up after a fall  Our next scheduled telephone follow up visit/ appointment with care management team member is scheduled on:  Friday, July 15, 2021 at 3:00 pm-- this is a [place] appointment  If you need to cancel or re-schedule our visit, please call [phone] and our care guide team will be happy to assist you.   I look forward to hearing about your progress.   Oneta Rack, RN, BSN, [facility] RN Care Coordination- [place] [phone]: direct office  If you are experiencing a Mental Health or [place] or need someone to talk to, please  call the Suicide  and Crisis Lifeline: 988 call the [place] National Suicide Prevention Lifeline: 307 641 8471 or TTY: 310 330 5782 TTY 623-636-1541) to talk to a trained counselor call 1-800-273-TALK (toll free, 24 hour hotline) go to [facility] [address] [place] [phone]) call 911   Patient verbalizes understanding of instructions provided today and agrees to view in [place] with COPD Being diagnosed with chronic obstructive pulmonary disease (COPD) changes your life physically and emotionally. Having COPD can affect your ability to work and do things you enjoy. COPD is not the same for everyone, and it may change over time. Your health care providers can help you come up with the COPD management plan that works best for you. How to manage lifestyle changes Treatment plan Work closely with your health care providers. Follow your COPD management plan. This plan includes: Instructions about activities, exercises, diet, medicines, what to do when COPD flares up, and when to call your health care provider. A pulmonary rehabilitation program. In pulmonary rehab, you will learn about COPD, do exercises for fitness and breathing, and get support from health care providers and other people who have COPD. Managing emotions and stress Living with a chronic disease means you may also struggle with stressful emotions, such as sadness, fear, and worry. Here are some ways to manage these emotions: Talk to someone about your fear, anxiety, depression, or stress. Learn strategies to avoid or reduce stress and ask for help if you are struggling with depression or anxiety. Consider joining a COPD support group, online or in person.  Adjusting to changes COPD may limit the  things you can do, but you can make certain changes to help you cope with the diagnosis. Ask for help when you need it. Getting support from friends, family, and your health care team is an  important part of managing the condition. Try to get regular exercise as prescribed by a health care provider or pulmonary rehab team. Exercising can help COPD, even if you are a bit short of breath. Take steps to prevent infection and protect your lungs: Wash your hands often and avoid being in crowds. Stay away from friends and family members who are sick. Check your local air quality each day, and stay out of areas where air pollution is likely. How to recognize changes in your condition Recognizing changes in your COPD COPD is a progressive disease. It is important to let the health care team know if your COPD is getting worse. Your treatment plan may need to change. Watch for: Increased shortness of breath, wheezing, cough, or fatigue. Loss of ability to exercise or perform daily activities, like climbing stairs. More frequent symptom flares. Signs of depression or anxiety. Recognizing stress It is normal to have additional stress when you have COPD. However, prolonged stress and anxiety can make COPD worse and lead to depression. Recognize the warning signs, which include: Feeling sad or worried more often or most of the time. Having less energy and losing interest in pleasurable activities. Changes in your appetite or sleeping patterns. Being easily angered or irritated. Having unexplained aches and pains, digestive problems, or headaches. Follow these instructions at home: Eating and drinking  Eat foods that are high in fiber, such as fresh fruits and vegetables, whole grains, and beans. Limit foods that are high in fat and processed sugars, such as fried or sweet foods. Follow a balanced diet and maintain a healthy weight. Being overweight or underweight can make COPD worse. You may work with a Microbiologist as part of your pulmonary rehab program. Drink enough fluid to keep your urine pale yellow. If you drink alcohol: Limit how much you have to: 0-1 drink a day for women who are  not pregnant. 0-2 drinks a day for men. Know how much alcohol is in your drink. In the [place] one drink equals one 12 oz bottle of beer (355 mL), one 5 oz glass of wine (148 mL), or one 1 oz glass of hard liquor (44 mL). Lifestyle If you smoke, the most important thing that you can do is to stop smoking. Continuing to smoke will cause the disease to progress faster. Do not use any products that contain nicotine or tobacco. These products include cigarettes, chewing tobacco, and vaping devices, such as e-cigarettes. If you need help quitting, ask your health care provider. Avoid exposure to things that irritate your lungs, such as smoke, chemicals, and fumes. Activity Balance exercise and rest. Take short walks every 1-2 hours. This is important to improve blood flow and breathing. Ask for help if you feel weak or unsteady. Do exercises that include controlled breathing with body movement, such as tai chi. General instructions Take over-the-counter and prescription medicines only as told by your health care provider. Take vitamin and protein supplements as told by your health care provider or dietitian. Practice good oral hygiene and see your dental care provider regularly. An oral infection can also spread to your lungs. Make sure you receive all the vaccines that your health care provider recommends. Keep all follow-up visits. This is important. Contact a health care provider if you: Are  struggling to manage your COPD. Have emotional stress that interferes with your ability to cope with COPD. Get help right away if you: Have thoughts of suicide, death, or hurting yourself or others. If you ever feel like you may hurt yourself or others, or have thoughts about taking your own life, get help right away. Go to your nearest emergency department or: Call your local emergency services (911 in the [place] Call a suicide crisis helpline, such as the [facility] at  307-310-7885 or 988 in the [place]. This is open 24 hours a day in the [place] Text the Crisis Text Line at 618-569-8870 (in the [place].). Summary Being diagnosed with chronic obstructive pulmonary disease (COPD) changes your life physically and emotionally. Work with your health care providers and follow your COPD management plan. A pulmonary rehabilitation program is an important part of COPD management. Prolonged stress, anxiety, and depression can make COPD worse. Let your health care provider know if emotional stress interferes with your ability to cope with and manage COPD. This information is not intended to replace advice given to you by your health care provider. Make sure you discuss any questions you have with your health care provider. Document Revised: 01/05/2021 Document Reviewed: 06/30/2020 Elsevier Patient Education  2022 [place]. Chronic Obstructive Pulmonary Disease Chronic obstructive pulmonary disease (COPD) is a long-term (chronic) lung problem. When you have COPD, it is hard for air to get in and out of your lungs. Usually the condition gets worse over time, and your lungs will never return to normal. There are things you can do to keep yourself as healthy as possible. What are the causes? Smoking. This is the most common cause. Certain genes passed from parent to child (inherited). What increases the risk? Being exposed to secondhand smoke from cigarettes, pipes, or cigars. Being exposed to chemicals and other irritants, such as fumes and dust in the work environment. Having chronic lung conditions or infections. What are the signs or symptoms? Shortness of breath, especially during physical activity. A long-term cough with a large amount of thick mucus. Sometimes, the cough may not have any mucus (dry cough). Wheezing. Breathing quickly. Skin that looks gray or blue, especially in the fingers, toes, or lips. Feeling tired (fatigue). Weight loss. Chest tightness. Having  infections often. Episodes when breathing symptoms become much worse (exacerbations). At the later stages of this disease, you may have swelling in the ankles, feet, or legs. How is this treated? Taking medicines. Quitting smoking, if you smoke. Rehabilitation. This includes steps to make your body work better. It may involve a team of specialists. Doing exercises. Making changes to your diet. Using oxygen. Lung surgery. Lung transplant. Comfort measures (palliative care). Follow these instructions at home: Medicines Take over-the-counter and prescription medicines only as told by your doctor. Talk to your doctor before taking any cough or allergy medicines. You may need to avoid medicines that cause your lungs to be dry. Lifestyle If you smoke, stop smoking. Smoking makes the problem worse. Do not smoke or use any products that contain nicotine or tobacco. If you need help quitting, ask your doctor. Avoid being around things that make your breathing worse. This may include smoke, chemicals, and fumes. Stay active, but remember to rest as well. Learn and use tips on how to manage stress and control your breathing. Make sure you get enough sleep. Most adults need at least 7 hours of sleep every night. Eat healthy foods. Eat smaller meals more often. Rest  before meals. Controlled breathing Learn and use tips on how to control your breathing as told by your doctor. Try: Breathing in (inhaling) through your nose for 1 second. Then, pucker your lips and breath out (exhale) through your lips for 2 seconds. Putting one hand on your belly (abdomen). Breathe in slowly through your nose for 1 second. Your hand on your belly should move out. Pucker your lips and breathe out slowly through your lips. Your hand on your belly should move in as you breathe out.  Controlled coughing Learn and use controlled coughing to clear mucus from your lungs. Follow these steps: Lean your head a little  forward. Breathe in deeply. Try to hold your breath for 3 seconds. Keep your mouth slightly open while coughing 2 times. Spit any mucus out into a tissue. Rest and do the steps again 1 or 2 times as needed. General instructions Make sure you get all the shots (vaccines) that your doctor recommends. Ask your doctor about a flu shot and a pneumonia shot. Use oxygen therapy and pulmonary rehabilitation if told by your doctor. If you need home oxygen therapy, ask your doctor if you should buy a tool to measure your oxygen level (oximeter). Make a COPD action plan with your doctor. This helps you to know what to do if you feel worse than usual. Manage any other conditions you have as told by your doctor. Avoid going outside when it is very hot, cold, or humid. Avoid people who have a sickness you can catch (contagious). Keep all follow-up visits. Contact a doctor if: You cough up more mucus than usual. There is a change in the color or thickness of the mucus. It is harder to breathe than usual. Your breathing is faster than usual. You have trouble sleeping. You need to use your medicines more often than usual. You have trouble doing your normal activities such as getting dressed or walking around the house. Get help right away if: You have shortness of breath while resting. You have shortness of breath that stops you from: Being able to talk. Doing normal activities. Your chest hurts for longer than 5 minutes. Your skin color is more blue than usual. Your pulse oximeter shows that you have low oxygen for longer than 5 minutes. You have a fever. You feel too tired to breathe normally. These symptoms may represent a serious problem that is an emergency. Do not wait to see if the symptoms will go away. Get medical help right away. Call your local emergency services (911 in the [place] Do not drive yourself to the hospital. Summary Chronic obstructive pulmonary disease (COPD) is a long-term  lung problem. The way your lungs work will never return to normal. Usually the condition gets worse over time. There are things you can do to keep yourself as healthy as possible. Take over-the-counter and prescription medicines only as told by your doctor. If you smoke, stop. Smoking makes the problem worse. This information is not intended to replace advice given to you by your health care provider. Make sure you discuss any questions you have with your health care provider. Document Revised: 04/20/2020 Document Reviewed: 04/20/2020 Elsevier Patient Education  2022 [employer].
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
## 2021-07-01 NOTE — Telephone Encounter (Signed)
MD rec'd O2 order was faxed back to [facility] @ [phone].Marland KitchenJohny Hansen

## 2021-07-01 NOTE — Telephone Encounter (Signed)
Faxed over, on Friday, O2 Stats for pt to get qualified for Oxygen. Pt has dr appt coming up, and they will not see her. Pt only has concentrator right now. Requesting call back from assistant.    Callback [phone]

## 2021-07-04 DIAGNOSIS — M1990 Unspecified osteoarthritis, unspecified site: Secondary | ICD-10-CM | POA: Diagnosis not present

## 2021-07-04 DIAGNOSIS — I252 Old myocardial infarction: Secondary | ICD-10-CM | POA: Diagnosis not present

## 2021-07-04 DIAGNOSIS — I110 Hypertensive heart disease with heart failure: Secondary | ICD-10-CM | POA: Diagnosis not present

## 2021-07-04 DIAGNOSIS — J441 Chronic obstructive pulmonary disease with (acute) exacerbation: Secondary | ICD-10-CM | POA: Diagnosis not present

## 2021-07-04 DIAGNOSIS — R69 Illness, unspecified: Secondary | ICD-10-CM | POA: Diagnosis not present

## 2021-07-04 DIAGNOSIS — I509 Heart failure, unspecified: Secondary | ICD-10-CM | POA: Diagnosis not present

## 2021-07-04 DIAGNOSIS — D638 Anemia in other chronic diseases classified elsewhere: Secondary | ICD-10-CM | POA: Diagnosis not present

## 2021-07-04 DIAGNOSIS — E039 Hypothyroidism, unspecified: Secondary | ICD-10-CM | POA: Diagnosis not present

## 2021-07-04 DIAGNOSIS — M87051 Idiopathic aseptic necrosis of right femur: Secondary | ICD-10-CM | POA: Diagnosis not present

## 2021-07-05 DIAGNOSIS — I509 Heart failure, unspecified: Secondary | ICD-10-CM | POA: Diagnosis not present

## 2021-07-05 DIAGNOSIS — J441 Chronic obstructive pulmonary disease with (acute) exacerbation: Secondary | ICD-10-CM | POA: Diagnosis not present

## 2021-07-05 DIAGNOSIS — I252 Old myocardial infarction: Secondary | ICD-10-CM | POA: Diagnosis not present

## 2021-07-05 DIAGNOSIS — M1990 Unspecified osteoarthritis, unspecified site: Secondary | ICD-10-CM | POA: Diagnosis not present

## 2021-07-05 DIAGNOSIS — R69 Illness, unspecified: Secondary | ICD-10-CM | POA: Diagnosis not present

## 2021-07-05 DIAGNOSIS — M87051 Idiopathic aseptic necrosis of right femur: Secondary | ICD-10-CM | POA: Diagnosis not present

## 2021-07-05 DIAGNOSIS — I110 Hypertensive heart disease with heart failure: Secondary | ICD-10-CM | POA: Diagnosis not present

## 2021-07-05 DIAGNOSIS — E039 Hypothyroidism, unspecified: Secondary | ICD-10-CM | POA: Diagnosis not present

## 2021-07-05 DIAGNOSIS — D638 Anemia in other chronic diseases classified elsewhere: Secondary | ICD-10-CM | POA: Diagnosis not present

## 2021-07-05 NOTE — Telephone Encounter (Signed)
Ok Thx 

## 2021-07-05 NOTE — Telephone Encounter (Signed)
Kaylee Hansen w/ Kaylee Hansen states patient's diastolic parameter bp reading is 54  Caller requesting verbal authorization to set patient's diastolic parameter to 50  Phone [phone] (Secure VM)   Caller checking request for portable oxygen tank  Caller requesting rx for tank fax to [facility]  Fax: [phone]

## 2021-07-06 ENCOUNTER — Ambulatory Visit (HOSPITAL_COMMUNITY): Payer: Medicare HMO | Attending: Cardiology

## 2021-07-06 ENCOUNTER — Other Ambulatory Visit: Payer: Self-pay

## 2021-07-06 DIAGNOSIS — I351 Nonrheumatic aortic (valve) insufficiency: Secondary | ICD-10-CM | POA: Diagnosis not present

## 2021-07-06 DIAGNOSIS — E039 Hypothyroidism, unspecified: Secondary | ICD-10-CM | POA: Diagnosis not present

## 2021-07-06 DIAGNOSIS — I509 Heart failure, unspecified: Secondary | ICD-10-CM | POA: Diagnosis not present

## 2021-07-06 DIAGNOSIS — M87051 Idiopathic aseptic necrosis of right femur: Secondary | ICD-10-CM | POA: Diagnosis not present

## 2021-07-06 DIAGNOSIS — J441 Chronic obstructive pulmonary disease with (acute) exacerbation: Secondary | ICD-10-CM | POA: Diagnosis not present

## 2021-07-06 DIAGNOSIS — M1990 Unspecified osteoarthritis, unspecified site: Secondary | ICD-10-CM | POA: Diagnosis not present

## 2021-07-06 DIAGNOSIS — D638 Anemia in other chronic diseases classified elsewhere: Secondary | ICD-10-CM | POA: Diagnosis not present

## 2021-07-06 DIAGNOSIS — R69 Illness, unspecified: Secondary | ICD-10-CM | POA: Diagnosis not present

## 2021-07-06 DIAGNOSIS — I252 Old myocardial infarction: Secondary | ICD-10-CM | POA: Diagnosis not present

## 2021-07-06 DIAGNOSIS — I110 Hypertensive heart disease with heart failure: Secondary | ICD-10-CM | POA: Diagnosis not present

## 2021-07-06 LAB — ECHOCARDIOGRAM COMPLETE
AR max vel: 1 cm2
AV Area VTI: 1.01 cm2
AV Area mean vel: 1.02 cm2
AV Mean grad: 25 mmHg
AV Peak grad: 40.1 mmHg
Ao pk vel: 3.17 m/s
Area-P 1/2: 3.08 cm2
P 1/2 time: 229 msec
S' Lateral: 3.4 cm

## 2021-07-06 NOTE — Telephone Encounter (Signed)
Kim calling in stating she received orders from Dr. Alain Marion for [facility] but he left off his credentials after his name.   Please add and fax back to [phone]

## 2021-07-06 NOTE — Telephone Encounter (Signed)
Number listed in previous message was incorrect to [facility].   Previous order requested by [name] so called and obtained Carrie's number [phone] and current orders ok' d.

## 2021-07-07 NOTE — Telephone Encounter (Signed)
MD added credentials faxed back plan of care.Marland KitchenJohny Chess

## 2021-07-11 DIAGNOSIS — M1990 Unspecified osteoarthritis, unspecified site: Secondary | ICD-10-CM | POA: Diagnosis not present

## 2021-07-11 DIAGNOSIS — D638 Anemia in other chronic diseases classified elsewhere: Secondary | ICD-10-CM | POA: Diagnosis not present

## 2021-07-11 DIAGNOSIS — J441 Chronic obstructive pulmonary disease with (acute) exacerbation: Secondary | ICD-10-CM | POA: Diagnosis not present

## 2021-07-11 DIAGNOSIS — R69 Illness, unspecified: Secondary | ICD-10-CM | POA: Diagnosis not present

## 2021-07-11 DIAGNOSIS — I252 Old myocardial infarction: Secondary | ICD-10-CM | POA: Diagnosis not present

## 2021-07-11 DIAGNOSIS — I110 Hypertensive heart disease with heart failure: Secondary | ICD-10-CM | POA: Diagnosis not present

## 2021-07-11 DIAGNOSIS — E039 Hypothyroidism, unspecified: Secondary | ICD-10-CM | POA: Diagnosis not present

## 2021-07-11 DIAGNOSIS — I509 Heart failure, unspecified: Secondary | ICD-10-CM | POA: Diagnosis not present

## 2021-07-11 DIAGNOSIS — M87051 Idiopathic aseptic necrosis of right femur: Secondary | ICD-10-CM | POA: Diagnosis not present

## 2021-07-11 NOTE — Telephone Encounter (Signed)
Kaylee Hansen [facility] making provider aware of patient's systolic bp reading of 848 w/ a diastolic reading of 42    Caller states patient is not complaining of any symptoms  Phone [phone] (Secure VM)

## 2021-07-12 ENCOUNTER — Ambulatory Visit: Payer: Medicare HMO | Admitting: Pulmonary Disease

## 2021-07-12 ENCOUNTER — Other Ambulatory Visit: Payer: Self-pay

## 2021-07-12 ENCOUNTER — Encounter: Payer: Self-pay | Admitting: Pulmonary Disease

## 2021-07-12 VITALS — BP 130/60 | HR 77 | Temp 98.1°F | Ht 65.0 in | Wt 153.0 lb

## 2021-07-12 DIAGNOSIS — J449 Chronic obstructive pulmonary disease, unspecified: Secondary | ICD-10-CM | POA: Diagnosis not present

## 2021-07-12 MED ORDER — BREZTRI AEROSPHERE 160-9-4.8 MCG/ACT IN AERO: 2.0000 | INHALATION_SPRAY | Freq: Two times a day (BID) | RESPIRATORY_TRACT | 5 refills | Status: DC

## 2021-07-12 MED ORDER — BREZTRI AEROSPHERE 160-9-4.8 MCG/ACT IN AERO: 2.0000 | INHALATION_SPRAY | Freq: Two times a day (BID) | RESPIRATORY_TRACT | 0 refills | Status: DC

## 2021-07-12 NOTE — Telephone Encounter (Signed)
Noted. Thanks.

## 2021-07-12 NOTE — Progress Notes (Signed)
Subjective:   PATIENT ID: Kaylee Hansen GENDER: female DOB: 1956-03-20, MRN: [id number]   HPI  Chief Complaint  Patient presents with   Consult    COPD Gold    Reason for Visit: New consult for COPD  Ms. Gwendola Hornaday is a 66 year old female former smoker with hx of left lung cancer s/p radiation 10/2019, COPD, nocturnal hypoxemia, CAD, aortic regurgitation, HTN who presents as a new consult. Husband is present and provides additional history.  She was recently discharged at 06/14/21 at [facility] for COPD exacerbation secondary to pneumonia. She has a few more days of doxycycline. She self-discontinued steroids due to insomnia. She has shortness of breath with transferring to commode. No wheezing. Chronic cough. She is not ambulatory at baseline due to joint issues and needs assistance with standing. She is using the albuterol once a day. Nebulizer is used three times a day but now 1-2 times a day after her hospitalization. Discharged on oxygen however not wearing it today with sats in mid 90s. She reports she quit smoking one week ago.  Social History: 1 ppd x 50 years. Quit one week ago.  I have personally reviewed patient's past medical/family/social history, allergies, current medications.  Past Medical History:  Diagnosis Date   Acute respiratory failure following trauma and surgery ([place]) 08/26/2012   Anxiety    Aortic insufficiency with aortic stenosis 04/2017   TTE December 2019: Mild AS with severe regurgitation.  Mild LA dilation.;;  TEE January 2016: Severe-type III aortic regurgitation (holodiastolic flow reversal in the a sending aorta & vena contracta =6.    CAD S/P percutaneous coronary angioplasty 11/2017   Proximal RCA PCI Synergy DES 3.5 mm x 16 mm (3.8 mm). Ost-mLM 30%. Ost-prox Cx 40%.; f/u Cath 03/2021: widely patent RCA stent. Ost-prox LCx 30%. Normal LAD-D2.   Collagenous colitis    Colon adenomas 2011   Constipation    Chronic abdominal pain and constipation    COPD (chronic obstructive pulmonary disease) (HCC)    Depression    Diverticulosis    Esophageal stricture 11/08/2012   Ulcer noted in 2010   GERD (gastroesophageal reflux disease)    Helicobacter pylori gastritis 2010   Pylera Tx   History of cholecystectomy    Hx of appendectomy    Hx of cancer of lung 1999   Hx of hysterectomy    Hyperlipidemia    Hypothyroidism    Iron deficiency anemia due to chronic blood loss 10/01/2019   Low back pain    Non-STEMI (non-ST elevated myocardial infarction) ([place]) 06/2018   RCA PCI   Osteoarthritis of knee    bilateral knee   Seizures (HCC)    Stroke (HCC)    CVA, hx of 97   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[place])      Family History  Problem Relation Age of Onset   Coronary artery disease Mother    Diabetes Mother    Hypertension Father    Diabetes Son    Coronary artery disease Other        grandmother, grandfather   Kidney disease Other        aunt   Kidney cancer Sister    Colon cancer Neg Hx        colon   Stomach cancer Neg Hx    Esophageal cancer Neg Hx    Pancreatic cancer Neg Hx    Liver disease Neg Hx      Social History   Occupational History  Occupation: disabled    Employer: DISABLED  Tobacco Use   Smoking status: Every Day    Packs/day: 0.50    Years: 48.00    Pack years: 24.00    Types: Cigarettes   Smokeless tobacco: Never   Tobacco comments:    09/21/20 8cigs/day.  Vaping Use   Vaping Use: Never used  Substance and Sexual Activity   Alcohol use: No   Drug use: No   Sexual activity: Not Currently    Allergies  Allergen Reactions   Nitrofuran Derivatives Other (See Comments)    confusion   Oxycodone Hcl Other (See Comments)    Knocked her out for 6 days   Morphine Other (See Comments)    "Makes me go crazy."    Penicillins Hives    Has patient had a PCN reaction causing immediate rash, facial/tongue/throat swelling, SOB or lightheadedness with hypotension: unknown Has patient had a PCN reaction causing  severe rash involving mucus membranes or skin necrosis: unknown Has patient had a PCN reaction that required hospitalization No Has patient had a PCN reaction occurring within the last 10 years: No If all of the above answers are "NO", then may proceed with Cephalosporin use.  Other reaction(s): Rash-Generalized   Tape Other (See Comments)    Skin turns red and burns     Outpatient Medications Prior to Visit  Medication Sig Dispense Refill   albuterol (PROVENTIL HFA;VENTOLIN HFA) 108 (90 Base) MCG/ACT inhaler Inhale 2 puffs into the lungs every 6 (six) hours as needed for wheezing or shortness of breath. 1 Inhaler 5   albuterol (PROVENTIL) (2.5 MG/3ML) 0.083% nebulizer solution INHALE 1 VIAL BY NEBULIZATION EVERY 4 HOURS AS NEEDED FOR WHEEZING OR SHORTNESS OF BREATH. 150 mL 5   ALPRAZolam (XANAX) 0.25 MG tablet Take 1 tablet (0.25 mg total) by mouth at bedtime as needed for anxiety. 90 tablet 2   aspirin EC 81 MG tablet Take 1 tablet (81 mg total) by mouth daily. 30 tablet 11   bisacodyl (DULCOLAX) 5 MG EC tablet Take 2 tablets (10 mg total) by mouth at bedtime. 30 tablet    calcium carbonate (TUMS - DOSED IN MG ELEMENTAL CALCIUM) 500 MG chewable tablet Chew 1 tablet by mouth daily as needed for indigestion or heartburn.     Coenzyme Q10 (COQ10) 100 MG CAPS Take 100 mg by mouth in the morning.     diltiazem (CARDIZEM CD) 360 MG 24 hr capsule Take 1 capsule (360 mg total) by mouth daily. 90 capsule 3   diphenhydrAMINE HCl, Sleep, (ZZZQUIL) 25 MG CAPS Take 50 mg by mouth at bedtime.     docusate sodium (COLACE) 100 MG capsule Take 100 mg by mouth daily as needed for mild constipation or moderate constipation.     doxycycline (VIBRA-TABS) 100 MG tablet Take 1 tablet (100 mg total) by mouth 2 (two) times daily. 28 tablet 0   fluconazole (DIFLUCAN) 150 MG tablet fluconazole 150 mg tablet  TAKE 1 TABLET (150 MG TOTAL) BY MOUTH ONCE FOR 1 DOSE.     furosemide (LASIX) 20 MG tablet Take 1 tablet (20  mg total) by mouth daily. 90 tablet 3   guaiFENesin (ROBITUSSIN) 100 MG/5ML liquid Take 200 mg by mouth 2 (two) times daily as needed for cough.     HYDROcodone-acetaminophen (NORCO) 10-325 MG tablet Take 1 tablet by mouth every 6 (six) hours as needed for severe pain. 120 tablet 0   HYDROcodone-acetaminophen (NORCO) 10-325 MG tablet Take 1 tablet by mouth  every 6 (six) hours as needed for severe pain. 120 tablet 0   hydroxypropyl methylcellulose / hypromellose (ISOPTO TEARS / GONIOVISC) 2.5 % ophthalmic solution Place 1 drop into both eyes 3 (three) times daily as needed for dry eyes.     ipratropium-albuterol (DUONEB) 0.5-2.5 (3) MG/3ML SOLN Take 3 mLs by nebulization every 4 (four) hours as needed. 360 mL 0   isosorbide mononitrate (IMDUR) 120 MG 24 hr tablet TAKE 1 TABLET (120 MG TOTAL) BY MOUTH DAILY. 90 tablet 2   ketoconazole (NIZORAL) 2 % cream APPLY TO AFFECTED AREA EVERY DAY (Patient taking differently: Apply 1 application topically daily as needed for irritation. APPLY TO AFFECTED AREA EVERY DAY) 45 g 1   levothyroxine (SYNTHROID) 150 MCG tablet TAKE 1 TABLET BY MOUTH EVERY DAY 90 tablet 3   methylPREDNISolone (MEDROL DOSEPAK) 4 MG TBPK tablet As directed 21 tablet 0   naloxegol oxalate (MOVANTIK) 12.5 MG TABS tablet Take 1 tablet (12.5 mg total) by mouth daily. 30 tablet 0   naproxen sodium (ALEVE) 220 MG tablet Take 220 mg by mouth daily as needed (For Headache).     nitroGLYCERIN (NITROSTAT) 0.4 MG SL tablet DISSOLVE ONE TABLET UNDER THE TONGUE EVERY 5 MINUTES AS NEEDED FOR CHEST PAIN.  DO NOT EXCEED A TOTAL OF 3 DOSES IN 15 MINUTES 25 tablet 6   OXYGEN Inhale 2.5 L/min into the lungs at bedtime.     pantoprazole (PROTONIX) 40 MG tablet Take 1 tablet (40 mg total) by mouth daily before breakfast. (Patient taking differently: Take 40 mg by mouth every evening.) 90 tablet 3   PARoxetine (PAXIL) 40 MG tablet TAKE 1 TABLET BY MOUTH EVERY DAY 90 tablet 3   phenytoin (DILANTIN) 100 MG ER  capsule TAKE 100 MG EVERY MORNING AND 200 MG EVERY NIGHT. 270 capsule 3   promethazine (PHENERGAN) 25 MG tablet Take 1 tablet (25 mg total) by mouth every 8 (eight) hours as needed for nausea or vomiting. 30 tablet 0   Respiratory Therapy Supplies (FLUTTER) DEVI As directed 1 each 0   rosuvastatin (CRESTOR) 20 MG tablet TAKE 1 TABLET BY MOUTH EVERY DAY 90 tablet 3   SUMAtriptan (IMITREX) 100 MG tablet MAY REPEAT IN 2 HOURS IF HEADACHE PERSISTS OR RECURS. 9 tablet 3   traZODone (DESYREL) 100 MG tablet TAKE 1 TABLET BY MOUTH EVERYDAY AT BEDTIME 90 tablet 1   Vitamin D, Ergocalciferol, (DRISDOL) 1.25 MG (50000 UNIT) CAPS capsule TAKE 1 CAPSULE (50,000 UNITS TOTAL) BY MOUTH EVERY MONDAY. 12 capsule 3   HYDROcodone-acetaminophen (NORCO) 10-325 MG tablet Take 1 tablet by mouth every 6 (six) hours as needed for up to 5 days for severe pain. 120 tablet 0   No facility-administered medications prior to visit.    Review of Systems  Constitutional:  Positive for malaise/fatigue. Negative for chills, diaphoresis, fever and weight loss.  HENT:  Negative for congestion, ear pain and sore throat.   Respiratory:  Positive for shortness of breath. Negative for cough, hemoptysis, sputum production and wheezing.   Cardiovascular:  Negative for chest pain, palpitations and leg swelling.  Gastrointestinal:  Negative for abdominal pain, heartburn and nausea.  Genitourinary:  Negative for frequency.  Musculoskeletal:  Positive for joint pain. Negative for myalgias.  Skin:  Negative for itching and rash.  Neurological:  Negative for dizziness, weakness and headaches.  Endo/Heme/Allergies:  Does not bruise/bleed easily.  Psychiatric/Behavioral:  Negative for depression. The patient is not nervous/anxious.     Objective:   Vitals:  07/12/21 1038  BP: 130/60  Pulse: 77  Temp: 98.1 F (36.7 C)  TempSrc: Oral  SpO2: 95%  Weight: 153 lb (69.4 kg)  Height: 5\' 5"  (1.651 m)   SpO2: 95 %  Physical  Exam: General: Chronically ill-appearing, no acute distress HENT: [place], AT Eyes: EOMI, no scleral icterus Respiratory: Diminished breath sounds bilaterally.  No crackles, wheezing or rales Cardiovascular: RRR, -M/R/G, no JVD Extremities:-Edema,-tenderness Neuro: AAO x4, CNII-XII grossly intact Psych: Normal mood, normal affect  Data Reviewed:  Imaging: CTA 01/26/21 -  No PE. Mild centrilobular and paraseptal emphysema. No hilar and mediastinal adenopathy. S/p radiation changes in LUL. Stable GGO/nodule measuring 1.3 cm and 1 cm. Unchanged subcentimeter nodules bilaterally including in LUL and LLL. Possible interval increase in size of scarring within the lingula in previous location of carcinoma.  PFT: 06/14/17 FVC 1.52 (45%) FEV1 1.26 (49%) Ratio 82 DLCO 44% Interpretation: Reduced FVC and FEV1. Significant bronchodilator response suggestive of reversible obstructive lung defect. Moderately reduced DLCO. Consider lung volume testing to confirm co-comitant restrictive defect  Labs: CBC    Component Value Date/Time   WBC 6.5 06/13/2021 1356   WBC 5.8 01/26/2021 1605   RBC 3.26 (L) 06/13/2021 1357   RBC 3.27 (L) 06/13/2021 1356   HGB 10.0 (L) 06/13/2021 1356   HGB 12.1 07/08/2018 1059   HCT 32.0 (L) 06/13/2021 1356   HCT 35.2 07/08/2018 1059   PLT 237 06/13/2021 1356   PLT 268 07/08/2018 1059   MCV 97.9 06/13/2021 1356   MCV 95 07/08/2018 1059   MCH 30.6 06/13/2021 1356   MCHC 31.3 06/13/2021 1356   RDW 14.6 06/13/2021 1356   RDW 12.5 07/08/2018 1059   LYMPHSABS 1.0 06/13/2021 1356   MONOABS 0.4 06/13/2021 1356   EOSABS 0.2 06/13/2021 1356   BASOSABS 0.1 06/13/2021 1356   Absolute eos 06/13/21 - 200    Assessment & Plan:   Discussion: 66 year old female with severe COPD/emphysema, hx of clinical bronchogenic carcinoma s/p SBRT, nocturnal hypoxemia, CAD, aortic regurgitation, HTN who presents as a new consult. We discussed clinical course of COPD and management with  bronchodilators and early infection treatment.  Severe COPD with emphysema START Breztri TWO puffs TWICE a day CONTINUE Albuterol and nebulizer as needed ARRANGE for pulmonary function tests prior to next visit  Nocturnal hypoxemia Plan for ambulatory O2 at next visit. If negative will need ONO  Tobacco abuse Patient is a former smoker.  ADDENDUM: Increase of lingula scarring on 01/2021 Hx lingular nodule s/p SBRT 10/2019 Pulmonary nodules Per Rad Onc note CT scan was scheduled for end of December but not completed Recommend PET/CT for further evaluation prior to next visit   Health Maintenance Immunization History  Administered Date(s) Administered   Fluad Quad(high Dose 65+) 04/03/2019, 04/29/2020, 03/10/2021   Influenza Split 03/27/2012   Influenza Whole 05/08/2007, 04/08/2009, 03/24/2011   Influenza, High Dose Seasonal PF 04/04/2017   Influenza,inj,Quad PF,6+ Mos 03/05/2013, 05/01/2014, 03/05/2015, 04/17/2016, 03/13/2018   PFIZER(Purple Top)SARS-COV-2 Vaccination 08/19/2019, 09/09/2019, 05/17/2020   Pneumococcal Conjugate-13 07/04/2016   Pneumococcal Polysaccharide-23 07/14/2015   Td 04/13/2016   Tdap 07/14/2015, 10/06/2020   CT Lung Screen - imaging recommendation as above  No orders of the defined types were placed in this encounter.  Meds ordered this encounter  Medications   Budeson-Glycopyrrol-Formoterol (BREZTRI AEROSPHERE) 160-9-4.8 MCG/ACT AERO    Sig: Inhale 2 puffs into the lungs in the morning and at bedtime.    Dispense:  10.7 g    Refill:  5   Budeson-Glycopyrrol-Formoterol (BREZTRI AEROSPHERE) 160-9-4.8 MCG/ACT AERO    Sig: Inhale 2 puffs into the lungs in the morning and at bedtime.    Dispense:  5.9 g    Refill:  0    Order Specific Question:   Manufacturer?    Answer:   [employer] [[id number]]    Return in about 1 month (around 08/12/2021).  I have spent a total time of 45-minutes on the day of the appointment reviewing prior documentation,  coordinating care and discussing medical diagnosis and plan with the patient/family. Imaging, labs and tests included in this note have been reviewed and interpreted independently by me.  [place], MD [facility] 07/12/2021 11:05 AM  Office Number [phone]

## 2021-07-12 NOTE — Patient Instructions (Addendum)
Severe COPD START Breztri TWO puffs TWICE a day CONTINUE Albuterol and nebulizer as needed ARRANGE for pulmonary function tests prior to next visit  Follow-up with me in 1 month

## 2021-07-13 DIAGNOSIS — E039 Hypothyroidism, unspecified: Secondary | ICD-10-CM | POA: Diagnosis not present

## 2021-07-13 DIAGNOSIS — D638 Anemia in other chronic diseases classified elsewhere: Secondary | ICD-10-CM | POA: Diagnosis not present

## 2021-07-13 DIAGNOSIS — I252 Old myocardial infarction: Secondary | ICD-10-CM | POA: Diagnosis not present

## 2021-07-13 DIAGNOSIS — I509 Heart failure, unspecified: Secondary | ICD-10-CM | POA: Diagnosis not present

## 2021-07-13 DIAGNOSIS — I110 Hypertensive heart disease with heart failure: Secondary | ICD-10-CM | POA: Diagnosis not present

## 2021-07-13 DIAGNOSIS — R69 Illness, unspecified: Secondary | ICD-10-CM | POA: Diagnosis not present

## 2021-07-13 DIAGNOSIS — M1990 Unspecified osteoarthritis, unspecified site: Secondary | ICD-10-CM | POA: Diagnosis not present

## 2021-07-13 DIAGNOSIS — J441 Chronic obstructive pulmonary disease with (acute) exacerbation: Secondary | ICD-10-CM | POA: Diagnosis not present

## 2021-07-13 DIAGNOSIS — M87051 Idiopathic aseptic necrosis of right femur: Secondary | ICD-10-CM | POA: Diagnosis not present

## 2021-07-14 DIAGNOSIS — D638 Anemia in other chronic diseases classified elsewhere: Secondary | ICD-10-CM | POA: Diagnosis not present

## 2021-07-14 DIAGNOSIS — R69 Illness, unspecified: Secondary | ICD-10-CM | POA: Diagnosis not present

## 2021-07-14 DIAGNOSIS — M87051 Idiopathic aseptic necrosis of right femur: Secondary | ICD-10-CM | POA: Diagnosis not present

## 2021-07-14 DIAGNOSIS — J441 Chronic obstructive pulmonary disease with (acute) exacerbation: Secondary | ICD-10-CM | POA: Diagnosis not present

## 2021-07-14 DIAGNOSIS — M1990 Unspecified osteoarthritis, unspecified site: Secondary | ICD-10-CM | POA: Diagnosis not present

## 2021-07-14 DIAGNOSIS — I110 Hypertensive heart disease with heart failure: Secondary | ICD-10-CM | POA: Diagnosis not present

## 2021-07-14 DIAGNOSIS — I252 Old myocardial infarction: Secondary | ICD-10-CM | POA: Diagnosis not present

## 2021-07-14 DIAGNOSIS — I509 Heart failure, unspecified: Secondary | ICD-10-CM | POA: Diagnosis not present

## 2021-07-14 DIAGNOSIS — E039 Hypothyroidism, unspecified: Secondary | ICD-10-CM | POA: Diagnosis not present

## 2021-07-15 ENCOUNTER — Ambulatory Visit: Payer: Medicare HMO | Admitting: *Deleted

## 2021-07-15 DIAGNOSIS — J449 Chronic obstructive pulmonary disease, unspecified: Secondary | ICD-10-CM

## 2021-07-15 DIAGNOSIS — C3490 Malignant neoplasm of unspecified part of unspecified bronchus or lung: Secondary | ICD-10-CM

## 2021-07-15 DIAGNOSIS — M25562 Pain in left knee: Secondary | ICD-10-CM | POA: Diagnosis not present

## 2021-07-15 DIAGNOSIS — M1712 Unilateral primary osteoarthritis, left knee: Secondary | ICD-10-CM | POA: Diagnosis not present

## 2021-07-15 NOTE — Patient Instructions (Signed)
Visit Information  Dustine and Rex, thank you for taking time to talk with me today. Please don't hesitate to contact me if I can be of assistance to you before our next scheduled telephone appointment.  Below are the goals we discussed today:  Patient Self-Care Activities: Patient Coreen will: Take medications as prescribed Attend all scheduled provider appointments Call pharmacy for medication refills Call provider office for new concerns or questions Continue working with home health team after your recent hospital visit Continue efforts to follow heart healthy, low salt, low cholesterol diet Develop and follow a rescue plan if symptoms flare up: please contact your care provider promptly if you believe your breathing is starting to get worse I am sorry to hear that you have started smoking again over the last 2 weeks-- if you would like to talk to your doctors about what you can do to completely quit, please ask them what your options are Not smoking is the single most important thing you can do to help with your breathing Continue using your oxygen as prescribed-- if you would like to ask your lung doctor (Dr. Loanne Drilling) about getting a portable oxygen tank, please talk to her about that when you see her on August 11, 2021 Continue your efforts to prevent falls at home: use your walker and your electric wheelchair: falls can cause serious injuries for you and for your husband who assists you in getting up after a fall   Our next scheduled telephone follow up visit/ appointment with care management team member is scheduled on: Monday, September 19, 2021 at 3:00 pm- This is a PHONE [place] appointment  If you need to cancel or re-schedule our visit, please call [phone] and our care guide team will be happy to assist you.   I look forward to hearing about your progress.   Oneta Rack, RN, BSN, [place] [phone]: direct  office  If you are experiencing a Mental Health or [place] or need someone to talk to, please  call the Suicide and Crisis Lifeline: 988 call the [place] National Suicide Prevention Lifeline: 386-811-6389 or TTY: 9283368188 TTY 701-729-5773) to talk to a trained counselor call 1-800-273-TALK (toll free, 24 hour hotline) go to [facility] [phone]) call 911   The patient verbalized understanding of instructions, educational materials, and care plan provided today and agreed to receive a mailed copy of patient instructions, educational materials, and care plan  Smoking Tobacco Information, Adult Smoking tobacco can be harmful to your health. Tobacco contains a poisonous (toxic), colorless chemical called nicotine. Nicotine is addictive. It changes the brain and can make it hard to stop smoking. Tobacco also has other toxic chemicals that can hurt your body and raise your risk of many cancers. How can smoking tobacco affect me? Smoking tobacco puts you at risk for: Cancer. Smoking is most commonly associated with lung cancer, but can also lead to cancer in other parts of the body. Chronic obstructive pulmonary disease (COPD). This is a long-term lung condition that makes it hard to breathe. It also gets worse over time. High blood pressure (hypertension), heart disease, stroke, or heart attack. Lung infections, such as pneumonia. Cataracts. This is when the lenses in the eyes become clouded. Digestive problems. This may include peptic ulcers, heartburn, and gastroesophageal reflux disease (GERD). Oral health problems, such as gum disease and tooth loss. Loss of taste and smell.  Smoking can affect your appearance by causing: Wrinkles. Yellow or stained teeth, fingers, and fingernails. Smoking tobacco can also affect your social life, because: It may be challenging to find places to smoke when away from home.  Many workplaces, [employer], hotels, and public places are tobacco-free. Smoking is expensive. This is due to the cost of tobacco and the long-term costs of treating health problems from smoking. Secondhand smoke may affect those around you. Secondhand smoke can cause lung cancer, breathing problems, and heart disease. Children of smokers have a higher risk for: Sudden infant death syndrome (SIDS). Ear infections. Lung infections. If you currently smoke tobacco, quitting now can help you: Lead a longer and healthier life. Look, smell, breathe, and feel better over time. Save money. Protect others from the harms of secondhand smoke. What actions can I take to prevent health problems? Quit smoking  Do not start smoking. Quit if you already do. Make a plan to quit smoking and commit to it. Look for programs to help you and ask your health care provider for recommendations and ideas. Set a date and write down all the reasons you want to quit. Let your friends and family know you are quitting so they can help and support you. Consider finding friends who also want to quit. It can be easier to quit with someone else, so that you can support each other. Talk with your health care provider about using nicotine replacement medicines to help you quit, such as gum, lozenges, patches, sprays, or pills. Do not replace cigarette smoking with electronic cigarettes, which are commonly called e-cigarettes. The safety of e-cigarettes is not known, and some may contain harmful chemicals. If you try to quit but return to smoking, stay positive. It is common to slip up when you first quit, so take it one day at a time. Be prepared for cravings. When you feel the urge to smoke, chew gum or suck on hard candy. Lifestyle Stay busy and take care of your body. Drink enough fluid to keep your urine pale yellow. Get plenty of exercise and eat a healthy diet. This can help prevent weight gain after quitting. Monitor  your eating habits. Quitting smoking can cause you to have a larger appetite than when you smoke. Find ways to relax. Go out with friends or family to a movie or a restaurant where people do not smoke. Ask your health care provider about having regular tests (screenings) to check for cancer. This may include blood tests, imaging tests, and other tests. Find ways to manage your stress, such as meditation, yoga, or exercise. Where to find support To get support to quit smoking, consider: Asking your health care provider for more information and resources. Taking classes to learn more about quitting smoking. Looking for local organizations that offer resources about quitting smoking. Joining a support group for people who want to quit smoking in your local community. Calling the smokefree.gov counselor helpline: 1-800-Quit-Now 463-507-0046) Where to find more information You may find more information about quitting smoking from: HelpGuide.org: www.helpguide.org https://hall.com/: smokefree.gov American Lung Association: www.lung.org Contact a health care provider if you: Have problems breathing. Notice that your lips, nose, or fingers turn blue. Have chest pain. Are coughing up blood. Feel faint or you pass out. Have other health changes that cause you to worry. Summary Smoking tobacco can negatively affect your health, the health of those around you, your finances, and your social life. Do not start smoking. Quit if you already do. If you need  help quitting, ask your health care provider. Think about joining a support group for people who want to quit smoking in your local community. There are many effective programs that will help you to quit this behavior. This information is not intended to replace advice given to you by your health care provider. Make sure you discuss any questions you have with your health care provider. Document Revised: 02/14/2021 Document Reviewed:  05/04/2020 Elsevier Patient Education  2022 [employer].
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
## 2021-07-18 ENCOUNTER — Encounter: Payer: Self-pay | Admitting: Pulmonary Disease

## 2021-07-18 ENCOUNTER — Telehealth: Payer: Self-pay | Admitting: Pulmonary Disease

## 2021-07-18 DIAGNOSIS — D638 Anemia in other chronic diseases classified elsewhere: Secondary | ICD-10-CM | POA: Diagnosis not present

## 2021-07-18 DIAGNOSIS — I509 Heart failure, unspecified: Secondary | ICD-10-CM | POA: Diagnosis not present

## 2021-07-18 DIAGNOSIS — M1990 Unspecified osteoarthritis, unspecified site: Secondary | ICD-10-CM | POA: Diagnosis not present

## 2021-07-18 DIAGNOSIS — J441 Chronic obstructive pulmonary disease with (acute) exacerbation: Secondary | ICD-10-CM | POA: Diagnosis not present

## 2021-07-18 DIAGNOSIS — E039 Hypothyroidism, unspecified: Secondary | ICD-10-CM | POA: Diagnosis not present

## 2021-07-18 DIAGNOSIS — R69 Illness, unspecified: Secondary | ICD-10-CM | POA: Diagnosis not present

## 2021-07-18 DIAGNOSIS — I110 Hypertensive heart disease with heart failure: Secondary | ICD-10-CM | POA: Diagnosis not present

## 2021-07-18 DIAGNOSIS — M87051 Idiopathic aseptic necrosis of right femur: Secondary | ICD-10-CM | POA: Diagnosis not present

## 2021-07-18 DIAGNOSIS — I252 Old myocardial infarction: Secondary | ICD-10-CM | POA: Diagnosis not present

## 2021-07-18 NOTE — Telephone Encounter (Signed)
Please contact the patient with the following information:  Since your last visit, I reviewed your prior imaging and notes and saw that you were due for a CT scan in December that was not completed. Due to the findings of your August CT with possible increase in size where your prior left lung nodule was, I recommend a PET/CT. If you are ok with this, we can order the scan the week prior to your next visit with me and we can discuss the results then.  If you would like a video or clinic visit to discuss this beforehand, please let us know and we can schedule the visit before we order the scan.  Dr. Loanne Drilling

## 2021-07-19 ENCOUNTER — Other Ambulatory Visit: Payer: Self-pay

## 2021-07-19 DIAGNOSIS — C3490 Malignant neoplasm of unspecified part of unspecified bronchus or lung: Secondary | ICD-10-CM | POA: Diagnosis not present

## 2021-07-19 NOTE — Telephone Encounter (Signed)
Called and spoke with Rex the patients husband to let him know the update per Dr Loanne Drilling. Patient verbalized understanding with no questions or concerns. Nothing further needed at this time

## 2021-07-19 NOTE — Telephone Encounter (Signed)
[facility] office had contacted patient regarding 01/2021 CT with possible increase of prior left lung nodule. I recommended PET for further evaluation but husband reports CT chest on 06/16/21 at [facility].   Assessment Increase of lingula scarring on 01/2021 Hx lingular nodule s/p SBRT 10/2019 Pulmonary nodules Per Rad Onc note CT scan was scheduled for end of December but not in [place]  Husband wishes for Oncology or Pulmonary to review imaging before pursuing PET scan.  Plan --OK to cancel PET scan order --Request records from [facility] to review CT scan --I will also message Dr. Marin Olp, her oncologist, regarding image CT that patient sent to him. However no upload of image seen in [place].

## 2021-07-19 NOTE — Telephone Encounter (Signed)
Called and son picked up the phone and stated that she was still asleep this morning I asked if when she wakes up she could give the office a call. He wrote down the number and will have her call when she wakes up.

## 2021-07-19 NOTE — Telephone Encounter (Signed)
Called and spoke with patients husband. He is wanting to know what a PET scan is consisted of? He is wanting to know the benefits of this scan because he states that his wife has had multiple scans and is worried about all these scans. Would like to personally talk to Dr Loanne Drilling if possible.   Dr Loanne Drilling please advise

## 2021-07-19 NOTE — Progress Notes (Signed)
Patients husband called stating pulmonoligst wanted to know what Dr.Ennevers thoughts on her recent CT scan was, as she wants to know before she treats her. Called patients husband back and informed him we do not have the CT results yet. Called [facility] to request scans and results urgently.

## 2021-07-20 ENCOUNTER — Telehealth: Payer: Self-pay | Admitting: Internal Medicine

## 2021-07-20 DIAGNOSIS — I110 Hypertensive heart disease with heart failure: Secondary | ICD-10-CM | POA: Diagnosis not present

## 2021-07-20 DIAGNOSIS — I252 Old myocardial infarction: Secondary | ICD-10-CM | POA: Diagnosis not present

## 2021-07-20 DIAGNOSIS — D638 Anemia in other chronic diseases classified elsewhere: Secondary | ICD-10-CM | POA: Diagnosis not present

## 2021-07-20 DIAGNOSIS — R69 Illness, unspecified: Secondary | ICD-10-CM | POA: Diagnosis not present

## 2021-07-20 DIAGNOSIS — J441 Chronic obstructive pulmonary disease with (acute) exacerbation: Secondary | ICD-10-CM | POA: Diagnosis not present

## 2021-07-20 DIAGNOSIS — E039 Hypothyroidism, unspecified: Secondary | ICD-10-CM | POA: Diagnosis not present

## 2021-07-20 DIAGNOSIS — M1990 Unspecified osteoarthritis, unspecified site: Secondary | ICD-10-CM | POA: Diagnosis not present

## 2021-07-20 DIAGNOSIS — I509 Heart failure, unspecified: Secondary | ICD-10-CM | POA: Diagnosis not present

## 2021-07-20 DIAGNOSIS — M87051 Idiopathic aseptic necrosis of right femur: Secondary | ICD-10-CM | POA: Diagnosis not present

## 2021-07-20 NOTE — Telephone Encounter (Signed)
Home Health verbal orders-caller/Agency: [facility] number: [phone] (secure vm)  Requesting OT/PT/Skilled nursing/Social Work/Speech: HH Aide  Reason: Extension  Frequency: 2w4  Start Date: 07-24-2021

## 2021-07-20 NOTE — Telephone Encounter (Signed)
Called Kaylee Hansen there was no answer LMOM ok to continue HHA pt is in good standing w/ visits.Marland KitchenJohny Chess

## 2021-07-21 DIAGNOSIS — M87051 Idiopathic aseptic necrosis of right femur: Secondary | ICD-10-CM | POA: Diagnosis not present

## 2021-07-21 DIAGNOSIS — I509 Heart failure, unspecified: Secondary | ICD-10-CM | POA: Diagnosis not present

## 2021-07-21 DIAGNOSIS — I252 Old myocardial infarction: Secondary | ICD-10-CM | POA: Diagnosis not present

## 2021-07-21 DIAGNOSIS — I110 Hypertensive heart disease with heart failure: Secondary | ICD-10-CM | POA: Diagnosis not present

## 2021-07-21 DIAGNOSIS — D638 Anemia in other chronic diseases classified elsewhere: Secondary | ICD-10-CM | POA: Diagnosis not present

## 2021-07-21 DIAGNOSIS — R69 Illness, unspecified: Secondary | ICD-10-CM | POA: Diagnosis not present

## 2021-07-21 DIAGNOSIS — E039 Hypothyroidism, unspecified: Secondary | ICD-10-CM | POA: Diagnosis not present

## 2021-07-21 DIAGNOSIS — M1990 Unspecified osteoarthritis, unspecified site: Secondary | ICD-10-CM | POA: Diagnosis not present

## 2021-07-21 DIAGNOSIS — J441 Chronic obstructive pulmonary disease with (acute) exacerbation: Secondary | ICD-10-CM | POA: Diagnosis not present

## 2021-07-21 NOTE — Telephone Encounter (Signed)
Okay. Thank you.

## 2021-07-25 DIAGNOSIS — I110 Hypertensive heart disease with heart failure: Secondary | ICD-10-CM | POA: Diagnosis not present

## 2021-07-25 DIAGNOSIS — M1990 Unspecified osteoarthritis, unspecified site: Secondary | ICD-10-CM | POA: Diagnosis not present

## 2021-07-25 DIAGNOSIS — I252 Old myocardial infarction: Secondary | ICD-10-CM | POA: Diagnosis not present

## 2021-07-25 DIAGNOSIS — E039 Hypothyroidism, unspecified: Secondary | ICD-10-CM | POA: Diagnosis not present

## 2021-07-25 DIAGNOSIS — J441 Chronic obstructive pulmonary disease with (acute) exacerbation: Secondary | ICD-10-CM | POA: Diagnosis not present

## 2021-07-25 DIAGNOSIS — I509 Heart failure, unspecified: Secondary | ICD-10-CM | POA: Diagnosis not present

## 2021-07-25 DIAGNOSIS — M87051 Idiopathic aseptic necrosis of right femur: Secondary | ICD-10-CM | POA: Diagnosis not present

## 2021-07-25 DIAGNOSIS — R69 Illness, unspecified: Secondary | ICD-10-CM | POA: Diagnosis not present

## 2021-07-25 DIAGNOSIS — D638 Anemia in other chronic diseases classified elsewhere: Secondary | ICD-10-CM | POA: Diagnosis not present

## 2021-07-26 ENCOUNTER — Other Ambulatory Visit: Payer: Self-pay | Admitting: Internal Medicine

## 2021-07-26 DIAGNOSIS — M87051 Idiopathic aseptic necrosis of right femur: Secondary | ICD-10-CM | POA: Diagnosis not present

## 2021-07-26 DIAGNOSIS — I509 Heart failure, unspecified: Secondary | ICD-10-CM | POA: Diagnosis not present

## 2021-07-26 DIAGNOSIS — F17210 Nicotine dependence, cigarettes, uncomplicated: Secondary | ICD-10-CM | POA: Diagnosis not present

## 2021-07-26 DIAGNOSIS — I252 Old myocardial infarction: Secondary | ICD-10-CM | POA: Diagnosis not present

## 2021-07-26 DIAGNOSIS — C3410 Malignant neoplasm of upper lobe, unspecified bronchus or lung: Secondary | ICD-10-CM | POA: Diagnosis not present

## 2021-07-26 DIAGNOSIS — R69 Illness, unspecified: Secondary | ICD-10-CM | POA: Diagnosis not present

## 2021-07-26 DIAGNOSIS — D638 Anemia in other chronic diseases classified elsewhere: Secondary | ICD-10-CM | POA: Diagnosis not present

## 2021-07-26 DIAGNOSIS — J441 Chronic obstructive pulmonary disease with (acute) exacerbation: Secondary | ICD-10-CM | POA: Diagnosis not present

## 2021-07-26 DIAGNOSIS — J449 Chronic obstructive pulmonary disease, unspecified: Secondary | ICD-10-CM | POA: Diagnosis not present

## 2021-07-26 DIAGNOSIS — E039 Hypothyroidism, unspecified: Secondary | ICD-10-CM | POA: Diagnosis not present

## 2021-07-26 DIAGNOSIS — I110 Hypertensive heart disease with heart failure: Secondary | ICD-10-CM | POA: Diagnosis not present

## 2021-07-26 DIAGNOSIS — M1990 Unspecified osteoarthritis, unspecified site: Secondary | ICD-10-CM | POA: Diagnosis not present

## 2021-07-27 ENCOUNTER — Telehealth: Payer: Self-pay | Admitting: *Deleted

## 2021-07-27 NOTE — Telephone Encounter (Signed)
Comments received;  Okay.  Sounds great.  She does have multiple comorbidities and I think we may want to hold off for a little bit, but just making sure that there was a potential option for TAVR.   [name], MD   Ivin Booty -> we can let her know that we can discuss on her echo during follow-up appointment and also reassess her symptoms.

## 2021-07-27 NOTE — Telephone Encounter (Signed)
-----   Message from Leonie Man, MD sent at 07/08/2021  4:32 PM EST ----- Echocardiogram shows stable normal heart function fixation.  The only notable change is that the aortic valve regurgitation is now being read as severe instead of moderate to severe.  There also does appear to be some moderate stenosis.  We will need to discuss whether or not symptoms of changes enough to potentially consider referral back to CT surgery.  In the past, but there has been some concern about valve replacement surgery from both sides.  Probably not to the extent yet where TAVR would be an option, but I can review with the TAVR team prior to follow-up visit.  [name], MD  (Coop -- can you take a look @ this lady's Echo?  Tell me what you think.  She is most definitely not a SAVR candidate -- horrible lungs & wheelchair bound -- not sure if TAVR is an option.   I think she saw Cub a year or so ago -- recommended watchful waiting. == > she is a tough one & may not have options).  [place]

## 2021-07-27 NOTE — Telephone Encounter (Signed)
Called left message to call back in regards to echo results  patient will also need a follow up appointment in April 2023

## 2021-07-28 DIAGNOSIS — R69 Illness, unspecified: Secondary | ICD-10-CM | POA: Diagnosis not present

## 2021-07-28 DIAGNOSIS — E039 Hypothyroidism, unspecified: Secondary | ICD-10-CM | POA: Diagnosis not present

## 2021-07-28 DIAGNOSIS — M1990 Unspecified osteoarthritis, unspecified site: Secondary | ICD-10-CM | POA: Diagnosis not present

## 2021-07-28 DIAGNOSIS — M87051 Idiopathic aseptic necrosis of right femur: Secondary | ICD-10-CM | POA: Diagnosis not present

## 2021-07-28 DIAGNOSIS — D638 Anemia in other chronic diseases classified elsewhere: Secondary | ICD-10-CM | POA: Diagnosis not present

## 2021-07-28 DIAGNOSIS — J441 Chronic obstructive pulmonary disease with (acute) exacerbation: Secondary | ICD-10-CM | POA: Diagnosis not present

## 2021-07-28 DIAGNOSIS — I110 Hypertensive heart disease with heart failure: Secondary | ICD-10-CM | POA: Diagnosis not present

## 2021-07-28 DIAGNOSIS — I252 Old myocardial infarction: Secondary | ICD-10-CM | POA: Diagnosis not present

## 2021-07-28 DIAGNOSIS — I509 Heart failure, unspecified: Secondary | ICD-10-CM | POA: Diagnosis not present

## 2021-08-01 DIAGNOSIS — I110 Hypertensive heart disease with heart failure: Secondary | ICD-10-CM | POA: Diagnosis not present

## 2021-08-01 DIAGNOSIS — I509 Heart failure, unspecified: Secondary | ICD-10-CM | POA: Diagnosis not present

## 2021-08-01 DIAGNOSIS — M1990 Unspecified osteoarthritis, unspecified site: Secondary | ICD-10-CM | POA: Diagnosis not present

## 2021-08-01 DIAGNOSIS — E039 Hypothyroidism, unspecified: Secondary | ICD-10-CM | POA: Diagnosis not present

## 2021-08-01 DIAGNOSIS — J441 Chronic obstructive pulmonary disease with (acute) exacerbation: Secondary | ICD-10-CM | POA: Diagnosis not present

## 2021-08-01 DIAGNOSIS — M87051 Idiopathic aseptic necrosis of right femur: Secondary | ICD-10-CM | POA: Diagnosis not present

## 2021-08-01 DIAGNOSIS — R69 Illness, unspecified: Secondary | ICD-10-CM | POA: Diagnosis not present

## 2021-08-01 DIAGNOSIS — D638 Anemia in other chronic diseases classified elsewhere: Secondary | ICD-10-CM | POA: Diagnosis not present

## 2021-08-01 DIAGNOSIS — I252 Old myocardial infarction: Secondary | ICD-10-CM | POA: Diagnosis not present

## 2021-08-03 ENCOUNTER — Telehealth: Payer: Medicare HMO

## 2021-08-03 DIAGNOSIS — M1990 Unspecified osteoarthritis, unspecified site: Secondary | ICD-10-CM | POA: Diagnosis not present

## 2021-08-03 DIAGNOSIS — I252 Old myocardial infarction: Secondary | ICD-10-CM | POA: Diagnosis not present

## 2021-08-03 DIAGNOSIS — D638 Anemia in other chronic diseases classified elsewhere: Secondary | ICD-10-CM | POA: Diagnosis not present

## 2021-08-03 DIAGNOSIS — E039 Hypothyroidism, unspecified: Secondary | ICD-10-CM | POA: Diagnosis not present

## 2021-08-03 DIAGNOSIS — I110 Hypertensive heart disease with heart failure: Secondary | ICD-10-CM | POA: Diagnosis not present

## 2021-08-03 DIAGNOSIS — J441 Chronic obstructive pulmonary disease with (acute) exacerbation: Secondary | ICD-10-CM | POA: Diagnosis not present

## 2021-08-03 DIAGNOSIS — M87051 Idiopathic aseptic necrosis of right femur: Secondary | ICD-10-CM | POA: Diagnosis not present

## 2021-08-03 DIAGNOSIS — I509 Heart failure, unspecified: Secondary | ICD-10-CM | POA: Diagnosis not present

## 2021-08-03 DIAGNOSIS — R69 Illness, unspecified: Secondary | ICD-10-CM | POA: Diagnosis not present
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
## 2021-08-04 DIAGNOSIS — M87051 Idiopathic aseptic necrosis of right femur: Secondary | ICD-10-CM | POA: Diagnosis not present

## 2021-08-04 DIAGNOSIS — M1990 Unspecified osteoarthritis, unspecified site: Secondary | ICD-10-CM | POA: Diagnosis not present

## 2021-08-04 DIAGNOSIS — I509 Heart failure, unspecified: Secondary | ICD-10-CM | POA: Diagnosis not present

## 2021-08-04 DIAGNOSIS — D638 Anemia in other chronic diseases classified elsewhere: Secondary | ICD-10-CM | POA: Diagnosis not present

## 2021-08-04 DIAGNOSIS — I252 Old myocardial infarction: Secondary | ICD-10-CM | POA: Diagnosis not present

## 2021-08-04 DIAGNOSIS — I110 Hypertensive heart disease with heart failure: Secondary | ICD-10-CM | POA: Diagnosis not present

## 2021-08-04 DIAGNOSIS — E039 Hypothyroidism, unspecified: Secondary | ICD-10-CM | POA: Diagnosis not present

## 2021-08-04 DIAGNOSIS — J441 Chronic obstructive pulmonary disease with (acute) exacerbation: Secondary | ICD-10-CM | POA: Diagnosis not present

## 2021-08-04 DIAGNOSIS — R69 Illness, unspecified: Secondary | ICD-10-CM | POA: Diagnosis not present

## 2021-08-08 DIAGNOSIS — J441 Chronic obstructive pulmonary disease with (acute) exacerbation: Secondary | ICD-10-CM | POA: Diagnosis not present

## 2021-08-08 DIAGNOSIS — I110 Hypertensive heart disease with heart failure: Secondary | ICD-10-CM | POA: Diagnosis not present

## 2021-08-08 DIAGNOSIS — I509 Heart failure, unspecified: Secondary | ICD-10-CM | POA: Diagnosis not present

## 2021-08-08 DIAGNOSIS — E039 Hypothyroidism, unspecified: Secondary | ICD-10-CM | POA: Diagnosis not present

## 2021-08-08 DIAGNOSIS — M87051 Idiopathic aseptic necrosis of right femur: Secondary | ICD-10-CM | POA: Diagnosis not present

## 2021-08-08 DIAGNOSIS — I252 Old myocardial infarction: Secondary | ICD-10-CM | POA: Diagnosis not present

## 2021-08-08 DIAGNOSIS — D638 Anemia in other chronic diseases classified elsewhere: Secondary | ICD-10-CM | POA: Diagnosis not present

## 2021-08-08 DIAGNOSIS — M1990 Unspecified osteoarthritis, unspecified site: Secondary | ICD-10-CM | POA: Diagnosis not present

## 2021-08-08 DIAGNOSIS — R69 Illness, unspecified: Secondary | ICD-10-CM | POA: Diagnosis not present

## 2021-08-09 ENCOUNTER — Telehealth: Payer: Self-pay

## 2021-08-10 DIAGNOSIS — I110 Hypertensive heart disease with heart failure: Secondary | ICD-10-CM | POA: Diagnosis not present

## 2021-08-10 DIAGNOSIS — J441 Chronic obstructive pulmonary disease with (acute) exacerbation: Secondary | ICD-10-CM | POA: Diagnosis not present

## 2021-08-10 DIAGNOSIS — I509 Heart failure, unspecified: Secondary | ICD-10-CM | POA: Diagnosis not present

## 2021-08-10 DIAGNOSIS — M87051 Idiopathic aseptic necrosis of right femur: Secondary | ICD-10-CM | POA: Diagnosis not present

## 2021-08-10 DIAGNOSIS — M1990 Unspecified osteoarthritis, unspecified site: Secondary | ICD-10-CM | POA: Diagnosis not present

## 2021-08-10 DIAGNOSIS — I252 Old myocardial infarction: Secondary | ICD-10-CM | POA: Diagnosis not present

## 2021-08-10 DIAGNOSIS — E039 Hypothyroidism, unspecified: Secondary | ICD-10-CM | POA: Diagnosis not present

## 2021-08-10 DIAGNOSIS — R69 Illness, unspecified: Secondary | ICD-10-CM | POA: Diagnosis not present

## 2021-08-10 DIAGNOSIS — D638 Anemia in other chronic diseases classified elsewhere: Secondary | ICD-10-CM | POA: Diagnosis not present

## 2021-08-10 NOTE — Progress Notes (Signed)
Chronic Care Management Pharmacy Assistant   Name: Kaylee Hansen  MRN: [id number] DOB: 11-16-1955  Kaylee Hansen is an 66 y.o. year old female who presents for his follow-up CCM visit with the clinical pharmacist.  Reason for Encounter: Disease State-General    Recent office visits:  None ID  Recent consult visits:  07/12/21 Kaylee Seeds, MD-Pulmonary Disease (COPD) No orders, med changes:START Breztri TWO puffs TWICE a day  Hospital visits:  None in previous 6 months  Medications: Outpatient Encounter Medications as of 08/09/2021  Medication Sig   albuterol (PROVENTIL HFA;VENTOLIN HFA) 108 (90 Base) MCG/ACT inhaler Inhale 2 puffs into the lungs every 6 (six) hours as needed for wheezing or shortness of breath.   albuterol (PROVENTIL) (2.5 MG/3ML) 0.083% nebulizer solution INHALE 1 VIAL BY NEBULIZATION EVERY 4 HOURS AS NEEDED FOR WHEEZING OR SHORTNESS OF BREATH.   ALPRAZolam (XANAX) 0.25 MG tablet Take 1 tablet (0.25 mg total) by mouth at bedtime as needed for anxiety.   aspirin EC 81 MG tablet Take 1 tablet (81 mg total) by mouth daily.   bisacodyl (DULCOLAX) 5 MG EC tablet Take 2 tablets (10 mg total) by mouth at bedtime.   Budeson-Glycopyrrol-Formoterol (BREZTRI AEROSPHERE) 160-9-4.8 MCG/ACT AERO Inhale 2 puffs into the lungs in the morning and at bedtime.   Budeson-Glycopyrrol-Formoterol (BREZTRI AEROSPHERE) 160-9-4.8 MCG/ACT AERO Inhale 2 puffs into the lungs in the morning and at bedtime.   calcium carbonate (TUMS - DOSED IN MG ELEMENTAL CALCIUM) 500 MG chewable tablet Chew 1 tablet by mouth daily as needed for indigestion or heartburn.   Coenzyme Q10 (COQ10) 100 MG CAPS Take 100 mg by mouth in the morning.   diltiazem (CARDIZEM CD) 360 MG 24 hr capsule Take 1 capsule (360 mg total) by mouth daily.   diphenhydrAMINE HCl, Sleep, (ZZZQUIL) 25 MG CAPS Take 50 mg by mouth at bedtime.   docusate sodium (COLACE) 100 MG capsule Take 100 mg by mouth daily as needed for mild  constipation or moderate constipation.   doxycycline (VIBRA-TABS) 100 MG tablet Take 1 tablet (100 mg total) by mouth 2 (two) times daily.   fluconazole (DIFLUCAN) 150 MG tablet fluconazole 150 mg tablet  TAKE 1 TABLET (150 MG TOTAL) BY MOUTH ONCE FOR 1 DOSE.   furosemide (LASIX) 20 MG tablet Take 1 tablet (20 mg total) by mouth daily.   guaiFENesin (ROBITUSSIN) 100 MG/5ML liquid Take 200 mg by mouth 2 (two) times daily as needed for cough.   HYDROcodone-acetaminophen (NORCO) 10-325 MG tablet Take 1 tablet by mouth every 6 (six) hours as needed for severe pain.   HYDROcodone-acetaminophen (NORCO) 10-325 MG tablet Take 1 tablet by mouth every 6 (six) hours as needed for severe pain.   HYDROcodone-acetaminophen (NORCO) 10-325 MG tablet Take 1 tablet by mouth every 6 (six) hours as needed for up to 5 days for severe pain.   hydroxypropyl methylcellulose / hypromellose (ISOPTO TEARS / GONIOVISC) 2.5 % ophthalmic solution Place 1 drop into both eyes 3 (three) times daily as needed for dry eyes.   ipratropium-albuterol (DUONEB) 0.5-2.5 (3) MG/3ML SOLN Take 3 mLs by nebulization every 4 (four) hours as needed.   isosorbide mononitrate (IMDUR) 120 MG 24 hr tablet TAKE 1 TABLET (120 MG TOTAL) BY MOUTH DAILY.   ketoconazole (NIZORAL) 2 % cream APPLY TO AFFECTED AREA EVERY DAY (Patient taking differently: Apply 1 application topically daily as needed for irritation. APPLY TO AFFECTED AREA EVERY DAY)   levothyroxine (SYNTHROID) 150 MCG tablet TAKE  1 TABLET BY MOUTH EVERY DAY   methylPREDNISolone (MEDROL DOSEPAK) 4 MG TBPK tablet As directed   naloxegol oxalate (MOVANTIK) 12.5 MG TABS tablet Take 1 tablet (12.5 mg total) by mouth daily.   naproxen sodium (ALEVE) 220 MG tablet Take 220 mg by mouth daily as needed (For Headache).   nitroGLYCERIN (NITROSTAT) 0.4 MG SL tablet DISSOLVE ONE TABLET UNDER THE TONGUE EVERY 5 MINUTES AS NEEDED FOR CHEST PAIN.  DO NOT EXCEED A TOTAL OF 3 DOSES IN 15 MINUTES   OXYGEN  Inhale 2.5 L/min into the lungs at bedtime.   pantoprazole (PROTONIX) 40 MG tablet Take 1 tablet (40 mg total) by mouth daily before breakfast. (Patient taking differently: Take 40 mg by mouth every evening.)   PARoxetine (PAXIL) 40 MG tablet TAKE 1 TABLET BY MOUTH EVERY DAY   phenytoin (DILANTIN) 100 MG ER capsule TAKE 100 MG EVERY MORNING AND 200 MG EVERY NIGHT.   promethazine (PHENERGAN) 25 MG tablet Take 1 tablet (25 mg total) by mouth every 8 (eight) hours as needed for nausea or vomiting.   Respiratory Therapy Supplies (FLUTTER) DEVI As directed   rosuvastatin (CRESTOR) 20 MG tablet TAKE 1 TABLET BY MOUTH EVERY DAY   SUMAtriptan (IMITREX) 100 MG tablet MAY REPEAT IN 2 HOURS IF HEADACHE PERSISTS OR RECURS.   traZODone (DESYREL) 100 MG tablet TAKE 1 TABLET BY MOUTH EVERYDAY AT BEDTIME   Vitamin D, Ergocalciferol, (DRISDOL) 1.25 MG (50000 UNIT) CAPS capsule TAKE 1 CAPSULE (50,000 UNITS TOTAL) BY MOUTH EVERY MONDAY.   No facility-administered encounter medications on file as of 08/09/2021.   Have you had any problems recently with your health?Spoke with patient husband who states that other than the patient sleeping all the time she does not have any new health issues  Have you had any problems with your pharmacy?He states that she does not have any problems with getting her medications or the cost of medications from the pharmacy  What issues or side effects are you having with your medications?He states that she does not have any  side effects to medications that he knows of  What would you like me to pass along to [facility] for them to help you with? He states that patient is doing about the same   What can we do to take care of you better? He states that she does not need anything at this time  Care Gaps: Colonoscopy-10/28/09 Diabetic Foot Exam-NA Mammogram-02/02/20 Ophthalmology-NA Dexa Scan - NA Annual Well Visit - NA Micro albumin-NA Hemoglobin A1c- NA  Star Rating  Drugs: Rosuvastatin 20 mg-last fill 06/04/21 90 ds  [place] Pharmacist Assistant [phone]

## 2021-08-11 ENCOUNTER — Other Ambulatory Visit: Payer: Self-pay

## 2021-08-11 ENCOUNTER — Encounter: Payer: Self-pay | Admitting: Pulmonary Disease

## 2021-08-11 ENCOUNTER — Ambulatory Visit: Payer: Medicare HMO | Admitting: Pulmonary Disease

## 2021-08-11 VITALS — BP 118/56 | HR 67 | Temp 97.7°F | Ht 65.0 in | Wt 151.4 lb

## 2021-08-11 DIAGNOSIS — G4734 Idiopathic sleep related nonobstructive alveolar hypoventilation: Secondary | ICD-10-CM | POA: Diagnosis not present

## 2021-08-11 DIAGNOSIS — R918 Other nonspecific abnormal finding of lung field: Secondary | ICD-10-CM | POA: Diagnosis not present

## 2021-08-11 DIAGNOSIS — Z720 Tobacco use: Secondary | ICD-10-CM | POA: Diagnosis not present

## 2021-08-11 DIAGNOSIS — J449 Chronic obstructive pulmonary disease, unspecified: Secondary | ICD-10-CM

## 2021-08-11 NOTE — Patient Instructions (Addendum)
Severe COPD with emphysema CONTINUE Breztri TWO puffs TWICE a day CONTINUE Albuterol and nebulizer as needed  Nocturnal hypoxemia CONTINUE oxygen when you sleep  Tobacco abuse CUT DOWN on smoking  Hx lingular nodule s/p SBRT 10/2019 Pulmonary nodules REQUEST CT chest on 06/16/21 from [facility] on disc or push.  Will also message Dr. Marin Olp and defer to him regarding further imaging  Follow-up with me in 3 months

## 2021-08-11 NOTE — Progress Notes (Signed)
Subjective:   PATIENT ID: Kaylee Hansen GENDER: female DOB: 02-10-1956, MRN: [id number]   HPI  Chief Complaint  Patient presents with   Follow-up    Affordable oxygen concentrator, [facility] doesn't have any needs new company     Reason for Visit: Follow-up COPD  Kaylee Hansen is a 66 year old female former smoker with hx of left lung cancer s/p radiation 10/2019, COPD, nocturnal hypoxemia, CAD, aortic regurgitation, HTN who presents as a new consult. Husband is present and provides additional history.  She was recently discharged at 06/14/21 at [facility] for COPD exacerbation secondary to pneumonia. She has a few more days of doxycycline. She self-discontinued steroids due to insomnia. She has shortness of breath with transferring to commode. No wheezing. Chronic cough. She is not ambulatory at baseline due to joint issues and needs assistance with standing. She is using the albuterol once a day. Nebulizer is used three times a day but now 1-2 times a day after her hospitalization. Discharged on oxygen however not wearing it today with sats in mid 90s. She reports she quit smoking one week ago.  08/11/21 Husband present who provides additional history. Patient is currently smoking 8 cigarettes a day. Using [employer] 3 days a week. Uses nebulizer 1-2 times a day for wheezing. She has shortness of breath. Minimal cough. Does have chest congestion. Husband is frustrated that she has received any oxygen for home. She is non-ambulatory and has difficulty standing up with physical therapy.  Social History: 1 ppd x 50 years. Quit one week ago.   Past Medical History:  Diagnosis Date   Acute respiratory failure following trauma and surgery ([place]) 08/26/2012   Anxiety    Aortic insufficiency with aortic stenosis 04/2017   TTE December 2019: Mild AS with severe regurgitation.  Mild LA dilation.;;  TEE January 2016: Severe-type III aortic regurgitation (holodiastolic flow reversal in the a  sending aorta & vena contracta =6.    CAD S/P percutaneous coronary angioplasty 11/2017   Proximal RCA PCI Synergy DES 3.5 mm x 16 mm (3.8 mm). Ost-mLM 30%. Ost-prox Cx 40%.; f/u Cath 03/2021: widely patent RCA stent. Ost-prox LCx 30%. Normal LAD-D2.   Collagenous colitis    Colon adenomas 2011   Constipation    Chronic abdominal pain and constipation   COPD (chronic obstructive pulmonary disease) (HCC)    Depression    Diverticulosis    Esophageal stricture 11/08/2012   Ulcer noted in 2010   GERD (gastroesophageal reflux disease)    Helicobacter pylori gastritis 2010   Pylera Tx   History of cholecystectomy    Hx of appendectomy    Hx of cancer of lung 1999   Hx of hysterectomy    Hyperlipidemia    Hypothyroidism    Iron deficiency anemia due to chronic blood loss 10/01/2019   Low back pain    Non-STEMI (non-ST elevated myocardial infarction) ([place]) 06/2018   RCA PCI   Osteoarthritis of knee    bilateral knee   Seizures (HCC)    Stroke (HCC)    CVA, hx of 97   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[place])      Family History  Problem Relation Age of Onset   Coronary artery disease Mother    Diabetes Mother    Hypertension Father    Diabetes Son    Coronary artery disease Other        grandmother, grandfather   Kidney disease Other        aunt  Kidney cancer Sister    Colon cancer Neg Hx        colon   Stomach cancer Neg Hx    Esophageal cancer Neg Hx    Pancreatic cancer Neg Hx    Liver disease Neg Hx      Social History   Occupational History   Occupation: disabled    Employer: DISABLED  Tobacco Use   Smoking status: Every Day    Packs/day: 1.00    Years: 48.00    Pack years: 48.00    Types: Cigarettes   Smokeless tobacco: Never   Tobacco comments:    09/21/20 8cigs/day.  Vaping Use   Vaping Use: Never used  Substance and Sexual Activity   Alcohol use: No   Drug use: No   Sexual activity: Not Currently    Allergies  Allergen Reactions   Nitrofuran Derivatives  Other (See Comments)    confusion   Oxycodone Hcl Other (See Comments)    Knocked her out for 6 days   Morphine Other (See Comments)    "Makes me go crazy."    Penicillins Hives    Has patient had a PCN reaction causing immediate rash, facial/tongue/throat swelling, SOB or lightheadedness with hypotension: unknown Has patient had a PCN reaction causing severe rash involving mucus membranes or skin necrosis: unknown Has patient had a PCN reaction that required hospitalization No Has patient had a PCN reaction occurring within the last 10 years: No If all of the above answers are "NO", then may proceed with Cephalosporin use.  Other reaction(s): Rash-Generalized   Tape Other (See Comments)    Skin turns red and burns     Outpatient Medications Prior to Visit  Medication Sig Dispense Refill   albuterol (PROVENTIL HFA;VENTOLIN HFA) 108 (90 Base) MCG/ACT inhaler Inhale 2 puffs into the lungs every 6 (six) hours as needed for wheezing or shortness of breath. 1 Inhaler 5   albuterol (PROVENTIL) (2.5 MG/3ML) 0.083% nebulizer solution INHALE 1 VIAL BY NEBULIZATION EVERY 4 HOURS AS NEEDED FOR WHEEZING OR SHORTNESS OF BREATH. 150 mL 5   ALPRAZolam (XANAX) 0.25 MG tablet Take 1 tablet (0.25 mg total) by mouth at bedtime as needed for anxiety. 90 tablet 2   aspirin EC 81 MG tablet Take 1 tablet (81 mg total) by mouth daily. 30 tablet 11   bisacodyl (DULCOLAX) 5 MG EC tablet Take 2 tablets (10 mg total) by mouth at bedtime. 30 tablet    Budeson-Glycopyrrol-Formoterol (BREZTRI AEROSPHERE) 160-9-4.8 MCG/ACT AERO Inhale 2 puffs into the lungs in the morning and at bedtime. 10.7 g 5   Budeson-Glycopyrrol-Formoterol (BREZTRI AEROSPHERE) 160-9-4.8 MCG/ACT AERO Inhale 2 puffs into the lungs in the morning and at bedtime. 5.9 g 0   calcium carbonate (TUMS - DOSED IN MG ELEMENTAL CALCIUM) 500 MG chewable tablet Chew 1 tablet by mouth daily as needed for indigestion or heartburn.     Coenzyme Q10 (COQ10) 100 MG  CAPS Take 100 mg by mouth in the morning.     diltiazem (CARDIZEM CD) 360 MG 24 hr capsule Take 1 capsule (360 mg total) by mouth daily. 90 capsule 3   diphenhydrAMINE HCl, Sleep, (ZZZQUIL) 25 MG CAPS Take 50 mg by mouth at bedtime.     docusate sodium (COLACE) 100 MG capsule Take 100 mg by mouth daily as needed for mild constipation or moderate constipation.     doxycycline (VIBRA-TABS) 100 MG tablet Take 1 tablet (100 mg total) by mouth 2 (two) times daily. 28 tablet  0   fluconazole (DIFLUCAN) 150 MG tablet fluconazole 150 mg tablet  TAKE 1 TABLET (150 MG TOTAL) BY MOUTH ONCE FOR 1 DOSE.     furosemide (LASIX) 20 MG tablet Take 1 tablet (20 mg total) by mouth daily. 90 tablet 3   guaiFENesin (ROBITUSSIN) 100 MG/5ML liquid Take 200 mg by mouth 2 (two) times daily as needed for cough.     HYDROcodone-acetaminophen (NORCO) 10-325 MG tablet Take 1 tablet by mouth every 6 (six) hours as needed for severe pain. 120 tablet 0   HYDROcodone-acetaminophen (NORCO) 10-325 MG tablet Take 1 tablet by mouth every 6 (six) hours as needed for severe pain. 120 tablet 0   HYDROcodone-acetaminophen (NORCO) 10-325 MG tablet Take 1 tablet by mouth every 6 (six) hours as needed for up to 5 days for severe pain. 120 tablet 0   hydroxypropyl methylcellulose / hypromellose (ISOPTO TEARS / GONIOVISC) 2.5 % ophthalmic solution Place 1 drop into both eyes 3 (three) times daily as needed for dry eyes.     ipratropium-albuterol (DUONEB) 0.5-2.5 (3) MG/3ML SOLN Take 3 mLs by nebulization every 4 (four) hours as needed. 360 mL 0   isosorbide mononitrate (IMDUR) 120 MG 24 hr tablet TAKE 1 TABLET (120 MG TOTAL) BY MOUTH DAILY. 90 tablet 2   ketoconazole (NIZORAL) 2 % cream APPLY TO AFFECTED AREA EVERY DAY (Patient taking differently: Apply 1 application topically daily as needed for irritation. APPLY TO AFFECTED AREA EVERY DAY) 45 g 1   levothyroxine (SYNTHROID) 150 MCG tablet TAKE 1 TABLET BY MOUTH EVERY DAY 90 tablet 3    methylPREDNISolone (MEDROL DOSEPAK) 4 MG TBPK tablet As directed 21 tablet 0   naloxegol oxalate (MOVANTIK) 12.5 MG TABS tablet Take 1 tablet (12.5 mg total) by mouth daily. 30 tablet 0   naproxen sodium (ALEVE) 220 MG tablet Take 220 mg by mouth daily as needed (For Headache).     nitroGLYCERIN (NITROSTAT) 0.4 MG SL tablet DISSOLVE ONE TABLET UNDER THE TONGUE EVERY 5 MINUTES AS NEEDED FOR CHEST PAIN.  DO NOT EXCEED A TOTAL OF 3 DOSES IN 15 MINUTES 25 tablet 6   OXYGEN Inhale 2.5 L/min into the lungs at bedtime.     pantoprazole (PROTONIX) 40 MG tablet Take 1 tablet (40 mg total) by mouth daily before breakfast. (Patient taking differently: Take 40 mg by mouth every evening.) 90 tablet 3   PARoxetine (PAXIL) 40 MG tablet TAKE 1 TABLET BY MOUTH EVERY DAY 90 tablet 3   phenytoin (DILANTIN) 100 MG ER capsule TAKE 100 MG EVERY MORNING AND 200 MG EVERY NIGHT. 270 capsule 3   promethazine (PHENERGAN) 25 MG tablet Take 1 tablet (25 mg total) by mouth every 8 (eight) hours as needed for nausea or vomiting. 30 tablet 0   Respiratory Therapy Supplies (FLUTTER) DEVI As directed 1 each 0   rosuvastatin (CRESTOR) 20 MG tablet TAKE 1 TABLET BY MOUTH EVERY DAY 90 tablet 3   SUMAtriptan (IMITREX) 100 MG tablet MAY REPEAT IN 2 HOURS IF HEADACHE PERSISTS OR RECURS. 9 tablet 3   traZODone (DESYREL) 100 MG tablet TAKE 1 TABLET BY MOUTH EVERYDAY AT BEDTIME 90 tablet 1   Vitamin D, Ergocalciferol, (DRISDOL) 1.25 MG (50000 UNIT) CAPS capsule TAKE 1 CAPSULE (50,000 UNITS TOTAL) BY MOUTH EVERY MONDAY. 12 capsule 3   No facility-administered medications prior to visit.    Review of Systems  Constitutional:  Positive for malaise/fatigue. Negative for chills, diaphoresis, fever and weight loss.  HENT:  Positive for congestion.  Respiratory:  Positive for shortness of breath. Negative for cough, hemoptysis, sputum production and wheezing.   Cardiovascular:  Negative for chest pain, palpitations and leg swelling.     Objective:   Vitals:   08/11/21 1337  BP: (!) 118/56  Pulse: 67  Temp: 97.7 F (36.5 C)  TempSrc: Oral  SpO2: 94%  Weight: 151 lb 6.4 oz (68.7 kg)  Height: 5\' 5"  (1.651 m)     Physical Exam: General: Chronically ill-appearing, no acute distress, sitting in wheelchair HENT: [place], AT Eyes: EOMI, no scleral icterus Respiratory: Diminished breath sounds to auscultation bilaterally.  No crackles, wheezing or rales Cardiovascular: RRR, -M/R/G, no JVD Extremities:-Edema,-tenderness Neuro: AAO x4, CNII-XII grossly intact Psych: Normal mood, normal affect   Data Reviewed:  Imaging: CTA 01/26/21 -  No PE. Mild centrilobular and paraseptal emphysema. No hilar and mediastinal adenopathy. S/p radiation changes in LUL. Stable GGO/nodule measuring 1.3 cm and 1 cm. Unchanged subcentimeter nodules bilaterally including in LUL and LLL. Possible interval increase in size of scarring within the lingula in previous location of carcinoma.  PFT: 06/14/17 FVC 1.52 (45%) FEV1 1.26 (49%) Ratio 82 DLCO 44% Interpretation: Reduced FVC and FEV1. Significant bronchodilator response suggestive of reversible obstructive lung defect. Moderately reduced DLCO. Consider lung volume testing to confirm co-comitant restrictive defect  Labs: CBC    Component Value Date/Time   WBC 6.5 06/13/2021 1356   WBC 5.8 01/26/2021 1605   RBC 3.26 (L) 06/13/2021 1357   RBC 3.27 (L) 06/13/2021 1356   HGB 10.0 (L) 06/13/2021 1356   HGB 12.1 07/08/2018 1059   HCT 32.0 (L) 06/13/2021 1356   HCT 35.2 07/08/2018 1059   PLT 237 06/13/2021 1356   PLT 268 07/08/2018 1059   MCV 97.9 06/13/2021 1356   MCV 95 07/08/2018 1059   MCH 30.6 06/13/2021 1356   MCHC 31.3 06/13/2021 1356   RDW 14.6 06/13/2021 1356   RDW 12.5 07/08/2018 1059   LYMPHSABS 1.0 06/13/2021 1356   MONOABS 0.4 06/13/2021 1356   EOSABS 0.2 06/13/2021 1356   BASOSABS 0.1 06/13/2021 1356   Absolute eos 06/13/21 - 200    Assessment & Plan:    Discussion: 66 year old female with severe COPD/emphysema, hx of clinical bronchogenic carcinoma s/p SBRT, nocturnal hypoxemia, CAD, aortic regurgitation and HTN who presents for follow-up. Unable to perform ambulatory O2 as patient unable to stand with physical therapy. However reportedly discharged with O2 on December 2022 hospitalization. Unable to qualify patient for exertional O2 in-office. Discussed clinical course and management of COPD including smoking cessation, compliance with bronchodilator regimen and action plan for exacerbation.  Severe COPD with emphysema CONTINUE Breztri TWO puffs TWICE a day CONTINUE Albuterol and nebulizer as needed  Nocturnal hypoxemia CONTINUE oxygen when you sleep Request [facility] records  Tobacco abuse Patient is an active smoker. We discussed smoking cessation for 5 minutes. We discussed triggers and stressors and ways to deal with them. We discussed barriers to continued smoking and benefits of smoking cessation. Provided patient with information cessation techniques and interventions including  quitline.  Increase of lingula scarring on 01/2021 Hx lingular nodule s/p SBRT 10/2019 Pulmonary nodules Per Rad Onc note CT scan was scheduled for end of December but not completed Will need to request [facility] records including CT Chest 06/16/21. Husband has requested to hold off on imaging until patient can be seen by Dr. Marin Olp - message sent regarding expectant management once he has reviewed CT   Health Maintenance Immunization History  Administered Date(s)  Administered   Fluad Quad(high Dose 65+) 04/03/2019, 04/29/2020, 03/10/2021   Influenza Split 03/27/2012   Influenza Whole 05/08/2007, 04/08/2009, 03/24/2011   Influenza, High Dose Seasonal PF 04/04/2017   Influenza,inj,Quad PF,6+ Mos 03/05/2013, 05/01/2014, 03/05/2015, 04/17/2016, 03/13/2018   PFIZER(Purple Top)SARS-COV-2 Vaccination 08/19/2019, 09/09/2019, 05/17/2020   Pneumococcal  Conjugate-13 07/04/2016   Pneumococcal Polysaccharide-23 07/14/2015   Td 04/13/2016   Tdap 07/14/2015, 10/06/2020   CT Lung Screen - imaging recommendation as above  No orders of the defined types were placed in this encounter.  No orders of the defined types were placed in this encounter.  Return in about 3 months (around 11/08/2021).  I have spent a total time of 40-minutes on the day of the appointment reviewing prior documentation, coordinating care and discussing medical diagnosis and plan with the patient/family. Past medical history, allergies, medications were reviewed. Pertinent imaging, labs and tests included in this note have been reviewed and interpreted independently by me.  [place], MD [facility] 08/11/2021 12:44 PM  Office Number [phone]

## 2021-08-13 ENCOUNTER — Encounter: Payer: Self-pay | Admitting: Pulmonary Disease

## 2021-08-15 DIAGNOSIS — I110 Hypertensive heart disease with heart failure: Secondary | ICD-10-CM | POA: Diagnosis not present

## 2021-08-15 DIAGNOSIS — E039 Hypothyroidism, unspecified: Secondary | ICD-10-CM | POA: Diagnosis not present

## 2021-08-15 DIAGNOSIS — M1990 Unspecified osteoarthritis, unspecified site: Secondary | ICD-10-CM | POA: Diagnosis not present

## 2021-08-15 DIAGNOSIS — D638 Anemia in other chronic diseases classified elsewhere: Secondary | ICD-10-CM | POA: Diagnosis not present

## 2021-08-15 DIAGNOSIS — I509 Heart failure, unspecified: Secondary | ICD-10-CM | POA: Diagnosis not present

## 2021-08-15 DIAGNOSIS — M87051 Idiopathic aseptic necrosis of right femur: Secondary | ICD-10-CM | POA: Diagnosis not present

## 2021-08-15 DIAGNOSIS — I252 Old myocardial infarction: Secondary | ICD-10-CM | POA: Diagnosis not present

## 2021-08-15 DIAGNOSIS — R69 Illness, unspecified: Secondary | ICD-10-CM | POA: Diagnosis not present

## 2021-08-15 DIAGNOSIS — J441 Chronic obstructive pulmonary disease with (acute) exacerbation: Secondary | ICD-10-CM | POA: Diagnosis not present

## 2021-08-17 DIAGNOSIS — M1990 Unspecified osteoarthritis, unspecified site: Secondary | ICD-10-CM | POA: Diagnosis not present

## 2021-08-17 DIAGNOSIS — R69 Illness, unspecified: Secondary | ICD-10-CM | POA: Diagnosis not present

## 2021-08-17 DIAGNOSIS — E039 Hypothyroidism, unspecified: Secondary | ICD-10-CM | POA: Diagnosis not present

## 2021-08-17 DIAGNOSIS — J441 Chronic obstructive pulmonary disease with (acute) exacerbation: Secondary | ICD-10-CM | POA: Diagnosis not present

## 2021-08-17 DIAGNOSIS — I110 Hypertensive heart disease with heart failure: Secondary | ICD-10-CM | POA: Diagnosis not present

## 2021-08-17 DIAGNOSIS — I252 Old myocardial infarction: Secondary | ICD-10-CM | POA: Diagnosis not present

## 2021-08-17 DIAGNOSIS — I509 Heart failure, unspecified: Secondary | ICD-10-CM | POA: Diagnosis not present

## 2021-08-17 DIAGNOSIS — M87051 Idiopathic aseptic necrosis of right femur: Secondary | ICD-10-CM | POA: Diagnosis not present

## 2021-08-17 DIAGNOSIS — D638 Anemia in other chronic diseases classified elsewhere: Secondary | ICD-10-CM | POA: Diagnosis not present

## 2021-08-18 DIAGNOSIS — I110 Hypertensive heart disease with heart failure: Secondary | ICD-10-CM | POA: Diagnosis not present

## 2021-08-18 DIAGNOSIS — I252 Old myocardial infarction: Secondary | ICD-10-CM | POA: Diagnosis not present

## 2021-08-18 DIAGNOSIS — E039 Hypothyroidism, unspecified: Secondary | ICD-10-CM | POA: Diagnosis not present

## 2021-08-18 DIAGNOSIS — M1990 Unspecified osteoarthritis, unspecified site: Secondary | ICD-10-CM | POA: Diagnosis not present

## 2021-08-18 DIAGNOSIS — D638 Anemia in other chronic diseases classified elsewhere: Secondary | ICD-10-CM | POA: Diagnosis not present

## 2021-08-18 DIAGNOSIS — R69 Illness, unspecified: Secondary | ICD-10-CM | POA: Diagnosis not present

## 2021-08-18 DIAGNOSIS — I509 Heart failure, unspecified: Secondary | ICD-10-CM | POA: Diagnosis not present

## 2021-08-18 DIAGNOSIS — J441 Chronic obstructive pulmonary disease with (acute) exacerbation: Secondary | ICD-10-CM | POA: Diagnosis not present

## 2021-08-18 DIAGNOSIS — M87051 Idiopathic aseptic necrosis of right femur: Secondary | ICD-10-CM | POA: Diagnosis not present

## 2021-08-19 ENCOUNTER — Other Ambulatory Visit: Payer: Self-pay | Admitting: Internal Medicine

## 2021-08-19 DIAGNOSIS — C3490 Malignant neoplasm of unspecified part of unspecified bronchus or lung: Secondary | ICD-10-CM | POA: Diagnosis not present

## 2021-08-26 NOTE — Telephone Encounter (Signed)
NOTE NOT NEEDED ?

## 2021-09-07 ENCOUNTER — Encounter: Payer: Self-pay | Admitting: Internal Medicine

## 2021-09-07 ENCOUNTER — Other Ambulatory Visit: Payer: Self-pay

## 2021-09-07 ENCOUNTER — Ambulatory Visit (INDEPENDENT_AMBULATORY_CARE_PROVIDER_SITE_OTHER): Payer: Medicare HMO | Admitting: Internal Medicine

## 2021-09-07 DIAGNOSIS — C3410 Malignant neoplasm of upper lobe, unspecified bronchus or lung: Secondary | ICD-10-CM

## 2021-09-07 DIAGNOSIS — S0101XA Laceration without foreign body of scalp, initial encounter: Secondary | ICD-10-CM | POA: Insufficient documentation

## 2021-09-07 DIAGNOSIS — J449 Chronic obstructive pulmonary disease, unspecified: Secondary | ICD-10-CM

## 2021-09-07 DIAGNOSIS — M87051 Idiopathic aseptic necrosis of right femur: Secondary | ICD-10-CM | POA: Insufficient documentation

## 2021-09-07 DIAGNOSIS — M5440 Lumbago with sciatica, unspecified side: Secondary | ICD-10-CM

## 2021-09-07 DIAGNOSIS — C3490 Malignant neoplasm of unspecified part of unspecified bronchus or lung: Secondary | ICD-10-CM

## 2021-09-07 DIAGNOSIS — G4734 Idiopathic sleep related nonobstructive alveolar hypoventilation: Secondary | ICD-10-CM | POA: Diagnosis not present

## 2021-09-07 DIAGNOSIS — E559 Vitamin D deficiency, unspecified: Secondary | ICD-10-CM | POA: Diagnosis not present

## 2021-09-07 DIAGNOSIS — Z20822 Contact with and (suspected) exposure to covid-19: Secondary | ICD-10-CM | POA: Insufficient documentation

## 2021-09-07 DIAGNOSIS — W19XXXA Unspecified fall, initial encounter: Secondary | ICD-10-CM | POA: Insufficient documentation

## 2021-09-07 DIAGNOSIS — G8929 Other chronic pain: Secondary | ICD-10-CM

## 2021-09-07 MED ORDER — HYDROCODONE-ACETAMINOPHEN 10-325 MG PO TABS: 1.0000 | ORAL_TABLET | Freq: Four times a day (QID) | ORAL | 0 refills | Status: DC | PRN

## 2021-09-07 NOTE — Assessment & Plan Note (Signed)
Continue w/Norco prn ? Potential benefits of a long term opioids use as well as potential risks (i.e. addiction risk, apnea etc) and complications (i.e. Somnolence, constipation and others) were explained to the patient and were aknowledged. ?

## 2021-09-07 NOTE — Progress Notes (Signed)
? ?Subjective:  ?Patient ID: Kaylee Hansen, female    DOB: [birth year]  Age: 66 y.o. MRN: [id number] ? ?CC: No chief complaint on file. ? ? ?HPI ?Kaylee Hansen presents for CAP in 12/22, COPD, lung cancer and LBP. ?She was discharged from [facility] f(COPD exacerbation secondary to pneumonia. She has a few more days of doxycycline. She self-discontinued steroids due to insomnia. She has shortness of breath with transferring to commode. No wheezing. Chronic cough. She is not ambulatory at baseline due to joint issues and needs assistance with standing). ?She is getting O2 from [place] at home ? ? ?Outpatient Medications Prior to Visit  ?Medication Sig Dispense Refill  ? albuterol (PROVENTIL HFA;VENTOLIN HFA) 108 (90 Base) MCG/ACT inhaler Inhale 2 puffs into the lungs every 6 (six) hours as needed for wheezing or shortness of breath. 1 Inhaler 5  ? albuterol (PROVENTIL) (2.5 MG/3ML) 0.083% nebulizer solution INHALE 1 VIAL BY NEBULIZATION EVERY 4 HOURS AS NEEDED FOR WHEEZING OR SHORTNESS OF BREATH. 150 mL 5  ? ALPRAZolam (XANAX) 0.25 MG tablet Take 1 tablet (0.25 mg total) by mouth at bedtime as needed for anxiety. 90 tablet 2  ? aspirin EC 81 MG tablet Take 1 tablet (81 mg total) by mouth daily. 30 tablet 11  ? bisacodyl (DULCOLAX) 5 MG EC tablet Take 2 tablets (10 mg total) by mouth at bedtime. 30 tablet   ? Budeson-Glycopyrrol-Formoterol (BREZTRI AEROSPHERE) 160-9-4.8 MCG/ACT AERO Inhale 2 puffs into the lungs in the morning and at bedtime. 10.7 g 5  ? calcium carbonate (TUMS - DOSED IN MG ELEMENTAL CALCIUM) 500 MG chewable tablet Chew 1 tablet by mouth daily as needed for indigestion or heartburn.    ? Coenzyme Q10 (COQ10) 100 MG CAPS Take 100 mg by mouth in the morning.    ? diltiazem (CARDIZEM CD) 360 MG 24 hr capsule Take 1 capsule (360 mg total) by mouth daily. 90 capsule 3  ? diphenhydrAMINE HCl, Sleep, (ZZZQUIL) 25 MG CAPS Take 50 mg by mouth at bedtime.    ? docusate sodium (COLACE) 100 MG capsule  Take 100 mg by mouth daily as needed for mild constipation or moderate constipation.    ? doxycycline (VIBRA-TABS) 100 MG tablet Take 1 tablet (100 mg total) by mouth 2 (two) times daily. 28 tablet 0  ? fluconazole (DIFLUCAN) 150 MG tablet fluconazole 150 mg tablet ? TAKE 1 TABLET (150 MG TOTAL) BY MOUTH ONCE FOR 1 DOSE.    ? furosemide (LASIX) 20 MG tablet Take 1 tablet (20 mg total) by mouth daily. 90 tablet 3  ? guaiFENesin (ROBITUSSIN) 100 MG/5ML liquid Take 200 mg by mouth 2 (two) times daily as needed for cough.    ? hydroxypropyl methylcellulose / hypromellose (ISOPTO TEARS / GONIOVISC) 2.5 % ophthalmic solution Place 1 drop into both eyes 3 (three) times daily as needed for dry eyes.    ? ipratropium-albuterol (DUONEB) 0.5-2.5 (3) MG/3ML SOLN Take 3 mLs by nebulization every 4 (four) hours as needed. 360 mL 0  ? isosorbide mononitrate (IMDUR) 120 MG 24 hr tablet TAKE 1 TABLET (120 MG TOTAL) BY MOUTH DAILY. 90 tablet 2  ? ketoconazole (NIZORAL) 2 % cream APPLY TO AFFECTED AREA EVERY DAY (Patient taking differently: Apply 1 application. topically daily as needed for irritation. APPLY TO AFFECTED AREA EVERY DAY) 45 g 1  ? levothyroxine (SYNTHROID) 150 MCG tablet TAKE 1 TABLET BY MOUTH EVERY DAY 90 tablet 3  ? methylPREDNISolone (MEDROL DOSEPAK) 4 MG TBPK tablet  As directed 21 tablet 0  ? naloxegol oxalate (MOVANTIK) 12.5 MG TABS tablet Take 1 tablet (12.5 mg total) by mouth daily. 30 tablet 0  ? naproxen sodium (ALEVE) 220 MG tablet Take 220 mg by mouth daily as needed (For Headache).    ? nitroGLYCERIN (NITROSTAT) 0.4 MG SL tablet DISSOLVE ONE TABLET UNDER THE TONGUE EVERY 5 MINUTES AS NEEDED FOR CHEST PAIN.  DO NOT EXCEED A TOTAL OF 3 DOSES IN 15 MINUTES 25 tablet 6  ? OXYGEN Inhale 2.5 L/min into the lungs at bedtime.    ? pantoprazole (PROTONIX) 40 MG tablet TAKE 1 TABLET BY MOUTH DAILY BEFORE BREAKFAST 90 tablet 3  ? PARoxetine (PAXIL) 40 MG tablet TAKE 1 TABLET BY MOUTH EVERY DAY 90 tablet 3  ? phenytoin  (DILANTIN) 100 MG ER capsule TAKE 100 MG EVERY MORNING AND 200 MG EVERY NIGHT. 270 capsule 3  ? promethazine (PHENERGAN) 25 MG tablet Take 1 tablet (25 mg total) by mouth every 8 (eight) hours as needed for nausea or vomiting. 30 tablet 0  ? Respiratory Therapy Supplies (FLUTTER) DEVI As directed 1 each 0  ? rosuvastatin (CRESTOR) 20 MG tablet TAKE 1 TABLET BY MOUTH EVERY DAY 90 tablet 3  ? SUMAtriptan (IMITREX) 100 MG tablet MAY REPEAT IN 2 HOURS IF HEADACHE PERSISTS OR RECURS. 9 tablet 3  ? traZODone (DESYREL) 100 MG tablet TAKE 1 TABLET BY MOUTH EVERYDAY AT BEDTIME 90 tablet 1  ? Vitamin D, Ergocalciferol, (DRISDOL) 1.25 MG (50000 UNIT) CAPS capsule TAKE 1 CAPSULE (50,000 UNITS TOTAL) BY MOUTH EVERY MONDAY. 12 capsule 3  ? Budeson-Glycopyrrol-Formoterol (BREZTRI AEROSPHERE) 160-9-4.8 MCG/ACT AERO Inhale 2 puffs into the lungs in the morning and at bedtime. 5.9 g 0  ? HYDROcodone-acetaminophen (NORCO) 10-325 MG tablet Take 1 tablet by mouth every 6 (six) hours as needed for severe pain. 120 tablet 0  ? HYDROcodone-acetaminophen (NORCO) 10-325 MG tablet Take 1 tablet by mouth every 6 (six) hours as needed for severe pain. 120 tablet 0  ? HYDROcodone-acetaminophen (NORCO) 10-325 MG tablet Take 1 tablet by mouth every 6 (six) hours as needed for up to 5 days for severe pain. 120 tablet 0  ? ?No facility-administered medications prior to visit.  ? ? ?ROS: ?Review of Systems  ?Constitutional:  Positive for fatigue. Negative for activity change, appetite change, chills and unexpected weight change.  ?HENT:  Negative for congestion, mouth sores and sinus pressure.   ?Eyes:  Negative for visual disturbance.  ?Respiratory:  Positive for chest tightness, shortness of breath and wheezing. Negative for cough.   ?Gastrointestinal:  Positive for constipation and nausea. Negative for abdominal pain and diarrhea.  ?Genitourinary:  Positive for frequency and urgency. Negative for difficulty urinating and vaginal pain.   ?Musculoskeletal:  Positive for arthralgias, back pain, gait problem and myalgias.  ?Skin:  Negative for pallor and rash.  ?Neurological:  Positive for weakness and light-headedness. Negative for dizziness, tremors, numbness and headaches.  ?Psychiatric/Behavioral:  Positive for decreased concentration and sleep disturbance. Negative for confusion and suicidal ideas. The patient is nervous/anxious.   ? ?Objective:  ?BP (!) 120/40 (BP Location: Left Arm, Patient Position: Sitting, Cuff Size: Large)   Pulse 84   Temp 98.4 ?F (36.9 ?C) (Oral)   Ht 5\' 5"  (1.651 m)   Wt 151 lb (68.5 kg)   SpO2 93%   BMI 25.13 kg/m?  ? ?BP Readings from Last 3 Encounters:  ?09/07/21 (!) 120/40  ?08/11/21 (!) 118/56  ?07/12/21 130/60  ? ? ?  Wt Readings from Last 3 Encounters:  ?09/07/21 151 lb (68.5 kg)  ?08/11/21 151 lb 6.4 oz (68.7 kg)  ?07/12/21 153 lb (69.4 kg)  ? ? ?Physical Exam ?Constitutional:   ?   General: She is not in acute distress. ?   Appearance: She is well-developed. She is obese. She is ill-appearing. She is not toxic-appearing or diaphoretic.  ?HENT:  ?   Head: Normocephalic.  ?   Right Ear: External ear normal.  ?   Left Ear: External ear normal.  ?   Nose: Nose normal.  ?Eyes:  ?   General:     ?   Right eye: No discharge.     ?   Left eye: No discharge.  ?   Conjunctiva/sclera: Conjunctivae normal.  ?   Pupils: Pupils are equal, round, and reactive to light.  ?Neck:  ?   Thyroid: No thyromegaly.  ?   Vascular: No JVD.  ?   Trachea: No tracheal deviation.  ?Cardiovascular:  ?   Rate and Rhythm: Normal rate and regular rhythm.  ?   Heart sounds: Normal heart sounds.  ?Pulmonary:  ?   Effort: No respiratory distress.  ?   Breath sounds: No stridor. No wheezing.  ?Abdominal:  ?   General: Bowel sounds are normal. There is no distension.  ?   Palpations: Abdomen is soft. There is no mass.  ?   Tenderness: There is no abdominal tenderness. There is no guarding or rebound.  ?Musculoskeletal:     ?   General: No  tenderness.  ?   Cervical back: Normal range of motion and neck supple. No rigidity.  ?Lymphadenopathy:  ?   Cervical: No cervical adenopathy.  ?Skin: ?   Findings: No erythema or rash.  ?Neurological:  ?   Cranial Nerves: N

## 2021-09-07 NOTE — Assessment & Plan Note (Signed)
Long-term; on night time O2 x years ?Cont w/O2 at 2.5 l/min [place]. She is getting O2 from [place] at home ?

## 2021-09-07 NOTE — Assessment & Plan Note (Addendum)
Albuterol MDI/HHN ? ?Cont w/O2 at 2.5 l/min [place] ?

## 2021-09-07 NOTE — Assessment & Plan Note (Signed)
F/u w/Dr Marin Olp ?Long-term; on night time O2 x years ?Cont w/O2 at 2.5 l/min [place]. She is getting O2 from [place] at home ?

## 2021-09-07 NOTE — Assessment & Plan Note (Signed)
On Vit D 

## 2021-09-07 NOTE — Assessment & Plan Note (Addendum)
Needs O2 prn day/night ?F/u w/Dr Marin Olp ?Smoking 1/2 PPD - discussed ?

## 2021-09-08 ENCOUNTER — Other Ambulatory Visit: Payer: Self-pay | Admitting: Cardiology

## 2021-09-12 ENCOUNTER — Encounter: Payer: Self-pay | Admitting: Hematology & Oncology

## 2021-09-12 ENCOUNTER — Inpatient Hospital Stay: Payer: Medicare HMO | Attending: Hematology & Oncology

## 2021-09-12 ENCOUNTER — Other Ambulatory Visit: Payer: Self-pay

## 2021-09-12 ENCOUNTER — Inpatient Hospital Stay: Payer: Medicare HMO | Admitting: Hematology & Oncology

## 2021-09-12 VITALS — BP 105/31 | HR 76 | Temp 98.5°F | Resp 18 | Wt 149.0 lb

## 2021-09-12 DIAGNOSIS — D509 Iron deficiency anemia, unspecified: Secondary | ICD-10-CM | POA: Diagnosis not present

## 2021-09-12 DIAGNOSIS — R059 Cough, unspecified: Secondary | ICD-10-CM | POA: Insufficient documentation

## 2021-09-12 DIAGNOSIS — Z79899 Other long term (current) drug therapy: Secondary | ICD-10-CM | POA: Insufficient documentation

## 2021-09-12 DIAGNOSIS — C3490 Malignant neoplasm of unspecified part of unspecified bronchus or lung: Secondary | ICD-10-CM

## 2021-09-12 DIAGNOSIS — J449 Chronic obstructive pulmonary disease, unspecified: Secondary | ICD-10-CM | POA: Insufficient documentation

## 2021-09-12 DIAGNOSIS — D500 Iron deficiency anemia secondary to blood loss (chronic): Secondary | ICD-10-CM

## 2021-09-12 LAB — CMP (CANCER CENTER ONLY)
ALT: 8 U/L (ref 0–44)
AST: 14 U/L — ABNORMAL LOW (ref 15–41)
Albumin: 3.7 g/dL (ref 3.5–5.0)
Alkaline Phosphatase: 135 U/L — ABNORMAL HIGH (ref 38–126)
Anion gap: 5 (ref 5–15)
BUN: 13 mg/dL (ref 8–23)
CO2: 32 mmol/L (ref 22–32)
Calcium: 9.3 mg/dL (ref 8.9–10.3)
Chloride: 105 mmol/L (ref 98–111)
Creatinine: 1 mg/dL (ref 0.44–1.00)
GFR, Estimated: >60 mL/min (ref >60)
Glucose, Bld: 114 mg/dL — ABNORMAL HIGH (ref 70–99)
Potassium: 4.5 mmol/L (ref 3.5–5.1)
Sodium: 142 mmol/L (ref 135–145)
Total Bilirubin: 0.2 mg/dL — ABNORMAL LOW (ref 0.3–1.2)
Total Protein: 6.5 g/dL (ref 6.5–8.1)

## 2021-09-12 LAB — CBC WITH DIFFERENTIAL (CANCER CENTER ONLY)
Abs Immature Granulocytes: 0.02 10*3/uL (ref 0.00–0.07)
Basophils Absolute: 0.1 10*3/uL (ref 0.0–0.1)
Basophils Relative: 1 %
Eosinophils Absolute: 0.2 10*3/uL (ref 0.0–0.5)
Eosinophils Relative: 2 %
HCT: 32.7 % — ABNORMAL LOW (ref 36.0–46.0)
Hemoglobin: 10.1 g/dL — ABNORMAL LOW (ref 12.0–15.0)
Immature Granulocytes: 0 %
Lymphocytes Relative: 18 %
Lymphs Abs: 1.3 10*3/uL (ref 0.7–4.0)
MCH: 30.7 pg (ref 26.0–34.0)
MCHC: 30.9 g/dL (ref 30.0–36.0)
MCV: 99.4 fL (ref 80.0–100.0)
Monocytes Absolute: 0.6 10*3/uL (ref 0.1–1.0)
Monocytes Relative: 8 %
Neutro Abs: 5 10*3/uL (ref 1.7–7.7)
Neutrophils Relative %: 71 %
Platelet Count: 262 10*3/uL (ref 150–400)
RBC: 3.29 MIL/uL — ABNORMAL LOW (ref 3.87–5.11)
RDW: 13.9 % (ref 11.5–15.5)
WBC Count: 7.1 10*3/uL (ref 4.0–10.5)
nRBC: 0 % (ref 0.0–0.2)

## 2021-09-12 LAB — IRON AND IRON BINDING CAPACITY (CC-WL,HP ONLY)
Iron: 49 ug/dL (ref 28–170)
Saturation Ratios: 18 % (ref 10.4–31.8)
TIBC: 280 ug/dL (ref 250–450)
UIBC: 231 ug/dL (ref 148–442)

## 2021-09-12 LAB — RETICULOCYTES
Immature Retic Fract: 15.9 % (ref 2.3–15.9)
RBC.: 3.26 MIL/uL — ABNORMAL LOW (ref 3.87–5.11)
Retic Count, Absolute: 54.1 10*3/uL (ref 19.0–186.0)
Retic Ct Pct: 1.7 % (ref 0.4–3.1)

## 2021-09-12 MED ORDER — SULFAMETHOXAZOLE-TRIMETHOPRIM 800-160 MG PO TABS: 1.0000 | ORAL_TABLET | Freq: Two times a day (BID) | ORAL | 0 refills | Status: DC

## 2021-09-12 NOTE — Progress Notes (Signed)
?Hematology and Oncology Follow Up Visit ? ?Kaylee Hansen ?[id number] ?[birth year] 66 y.o. ?09/12/2021 ? ? 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  ?Past Therapy:        ?SBRT -- s/p 5400 rad -- finished on 11/13/2019 ?  ?Current Therapy: ?IV iron as indicated  ?  ?Interim History:  [name] is here today with her husband for follow-up.  Apparently, she had a CT scan done at [facility] back in December.  Unfortunately, I do not have any of those results. ? ?The early Spring really has bothered her underlying COPD.  She has had more difficulty.  She really sounds congested.  She is coughing up some greenish sputum.  I think we will go ahead and try to get her on some antibiotic.  I will try her on Bactrim DS (1 p.o. twice daily x7 days).  She also says she has a urinary tract infection. ? ?From what the husband says, on the CT scan that she had done at [facility], the doctor said that there may have been some growth of the tumor. ? ?Again I am not sure exactly what is going on.  We will have to get a PET scan to see exactly what is going on. ? ?She has had a decent appetite.  She has had no nausea or vomiting.  She has had no diarrhea unless she takes Colace or Dulcolax. ? ?She has had no bleeding.  There has been no fever. ? ?Her last iron studies back in December showed a ferritin of 70 with iron saturation of 31%. ? ?Overall, I would say performance status is probably ECOG 2-3. ? ?Medications:  ?Allergies as of 09/12/2021   ? ?   Reactions  ? Nitrofuran Derivatives Other (See Comments)  ? confusion  ? Oxycodone Hcl Other (See Comments)  ? Knocked her out for 6 days  ? Morphine Other (See Comments)  ? "Makes me go crazy."   ? Penicillins Hives  ? Has patient had a PCN reaction causing immediate rash, facial/tongue/throat swelling, SOB or lightheadedness with hypotension: unknown ?Has patient had a PCN reaction causing severe rash involving mucus  membranes or skin necrosis: unknown ?Has patient had a PCN reaction that required hospitalization No ?Has patient had a PCN reaction occurring within the last 10 years: No ?If all of the above answers are "NO", then may proceed with Cephalosporin use. ?Other reaction(s): Rash-Generalized  ? Tape Other (See Comments)  ? Skin turns red and burns  ? ?  ? ?  ?Medication List  ?  ? ?  ? Accurate as of September 12, 2021  1:36 PM. If you have any questions, ask your nurse or doctor.  ?  ?  ? ?  ? ?albuterol 108 (90 Base) MCG/ACT inhaler ?Commonly known as: VENTOLIN HFA ?Inhale 2 puffs into the lungs every 6 (six) hours as needed for wheezing or shortness of breath. ?  ?albuterol (2.5 MG/3ML) 0.083% nebulizer solution ?Commonly known as: PROVENTIL ?INHALE 1 VIAL BY NEBULIZATION EVERY 4 HOURS AS NEEDED FOR WHEEZING OR SHORTNESS OF BREATH. ?  ?ALPRAZolam 0.25 MG tablet ?Commonly known as: Duanne Moron ?Take 1 tablet (0.25 mg total) by mouth at bedtime as needed for anxiety. ?  ?aspirin EC 81 MG tablet ?Take 1 tablet (81 mg total) by mouth daily. ?  ?bisacodyl 5 MG EC tablet ?Commonly known as: Dulcolax ?Take 2 tablets (10 mg total) by mouth at bedtime. ?  ?[employer]  Aerosphere 160-9-4.8 MCG/ACT Aero ?Generic drug: Budeson-Glycopyrrol-Formoterol ?Inhale 2 puffs into the lungs in the morning and at bedtime. ?  ?calcium carbonate 500 MG chewable tablet ?Commonly known as: TUMS - dosed in mg elemental calcium ?Chew 1 tablet by mouth daily as needed for indigestion or heartburn. ?  ?CoQ10 100 MG Caps ?Take 100 mg by mouth in the morning. ?  ?diltiazem 360 MG 24 hr capsule ?Commonly known as: CARDIZEM CD ?TAKE 1 CAPSULE BY MOUTH EVERY DAY ?  ?docusate sodium 100 MG capsule ?Commonly known as: COLACE ?Take 100 mg by mouth daily as needed for mild constipation or moderate constipation. ?  ?doxycycline 100 MG tablet ?Commonly known as: VIBRA-TABS ?Take 1 tablet (100 mg total) by mouth 2 (two) times daily. ?  ?fluconazole 150 MG tablet ?Commonly  known as: DIFLUCAN ?fluconazole 150 mg tablet ? TAKE 1 TABLET (150 MG TOTAL) BY MOUTH ONCE FOR 1 DOSE. ?  ?[name] ?As directed ?  ?furosemide 20 MG tablet ?Commonly known as: LASIX ?TAKE 1 TABLET BY MOUTH EVERY DAY ?  ?guaiFENesin 100 MG/5ML liquid ?Commonly known as: ROBITUSSIN ?Take 200 mg by mouth 2 (two) times daily as needed for cough. ?  ?HYDROcodone-acetaminophen 10-325 MG tablet ?Commonly known as: NORCO ?Take 1 tablet by mouth every 6 (six) hours as needed for severe pain. ?  ?HYDROcodone-acetaminophen 10-325 MG tablet ?Commonly known as: NORCO ?Take 1 tablet by mouth every 6 (six) hours as needed for severe pain. ?  ?HYDROcodone-acetaminophen 10-325 MG tablet ?Commonly known as: NORCO ?Take 1 tablet by mouth every 6 (six) hours as needed for up to 5 days for severe pain. ?  ?hydroxypropyl methylcellulose / hypromellose 2.5 % ophthalmic solution ?Commonly known as: ISOPTO TEARS / GONIOVISC ?Place 1 drop into both eyes 3 (three) times daily as needed for dry eyes. ?  ?ipratropium-albuterol 0.5-2.5 (3) MG/3ML Soln ?Commonly known as: DUONEB ?Take 3 mLs by nebulization every 4 (four) hours as needed. ?  ?isosorbide mononitrate 120 MG 24 hr tablet ?Commonly known as: IMDUR ?TAKE 1 TABLET (120 MG TOTAL) BY MOUTH DAILY. ?  ?ketoconazole 2 % cream ?Commonly known as: NIZORAL ?APPLY TO AFFECTED AREA EVERY DAY ?What changed:  ?how much to take ?how to take this ?when to take this ?reasons to take this ?  ?levothyroxine 150 MCG tablet ?Commonly known as: SYNTHROID ?TAKE 1 TABLET BY MOUTH EVERY DAY ?  ?methylPREDNISolone 4 MG Tbpk tablet ?Commonly known as: MEDROL DOSEPAK ?As directed ?  ?naloxegol oxalate 12.5 MG Tabs tablet ?Commonly known as: MOVANTIK ?Take 1 tablet (12.5 mg total) by mouth daily. ?  ?naproxen sodium 220 MG tablet ?Commonly known as: ALEVE ?Take 220 mg by mouth daily as needed (For Headache). ?  ?nitroGLYCERIN 0.4 MG SL tablet ?Commonly known as: NITROSTAT ?DISSOLVE ONE TABLET UNDER THE  TONGUE EVERY 5 MINUTES AS NEEDED FOR CHEST PAIN.  DO NOT EXCEED A TOTAL OF 3 DOSES IN 15 MINUTES ?  ?OXYGEN ?Inhale 2.5 L/min into the lungs at bedtime. ?  ?pantoprazole 40 MG tablet ?Commonly known as: PROTONIX ?TAKE 1 TABLET BY MOUTH DAILY BEFORE BREAKFAST ?  ?PARoxetine 40 MG tablet ?Commonly known as: PAXIL ?TAKE 1 TABLET BY MOUTH EVERY DAY ?  ?phenytoin 100 MG ER capsule ?Commonly known as: DILANTIN ?TAKE 100 MG EVERY MORNING AND 200 MG EVERY NIGHT. ?  ?promethazine 25 MG tablet ?Commonly known as: PHENERGAN ?Take 1 tablet (25 mg total) by mouth every 8 (eight) hours as needed for nausea or vomiting. ?  ?  rosuvastatin 20 MG tablet ?Commonly known as: CRESTOR ?TAKE 1 TABLET BY MOUTH EVERY DAY ?  ?sulfamethoxazole-trimethoprim 800-160 MG tablet ?Commonly known as: BACTRIM DS ?Take 1 tablet by mouth 2 (two) times daily. ?Started by: [name], MD ?  ?SUMAtriptan 100 MG tablet ?Commonly known as: IMITREX ?MAY REPEAT IN 2 HOURS IF HEADACHE PERSISTS OR RECURS. ?  ?traZODone 100 MG tablet ?Commonly known as: DESYREL ?TAKE 1 TABLET BY MOUTH EVERYDAY AT BEDTIME ?  ?Vitamin D (Ergocalciferol) 1.25 MG (50000 UNIT) Caps capsule ?Commonly known as: DRISDOL ?TAKE 1 CAPSULE (50,000 UNITS TOTAL) BY MOUTH EVERY MONDAY. ?  ?ZzzQuil 25 MG Caps ?Generic drug: diphenhydrAMINE HCl (Sleep) ?Take 50 mg by mouth at bedtime. ?  ? ?  ? ? ?Allergies:  ?Allergies  ?Allergen Reactions  ? Nitrofuran Derivatives Other (See Comments)  ?  confusion  ? Oxycodone Hcl Other (See Comments)  ?  Knocked her out for 6 days  ? Morphine Other (See Comments)  ?  "Makes me go crazy."   ? Penicillins Hives  ?  Has patient had a PCN reaction causing immediate rash, facial/tongue/throat swelling, SOB or lightheadedness with hypotension: unknown ?Has patient had a PCN reaction causing severe rash involving mucus membranes or skin necrosis: unknown ?Has patient had a PCN reaction that required hospitalization No ?Has patient had a PCN reaction occurring  within the last 10 years: No ?If all of the above answers are "NO", then may proceed with Cephalosporin use. ? ?Other reaction(s): Rash-Generalized  ? Tape Other (See Comments)  ?  Skin turns red and burns  ? ?

## 2021-09-13 ENCOUNTER — Telehealth: Payer: Self-pay

## 2021-09-13 ENCOUNTER — Telehealth: Payer: Self-pay | Admitting: Hematology & Oncology

## 2021-09-13 ENCOUNTER — Other Ambulatory Visit: Payer: Self-pay | Admitting: Pharmacist

## 2021-09-13 LAB — FERRITIN: Ferritin: 33 ng/mL (ref 11–307)

## 2021-09-13 NOTE — Telephone Encounter (Signed)
-----   Message from [name], MD sent at 09/12/2021  5:25 PM EDT ----- ?Please call and let her know that the iron level is on the low side.  She probably needs a dose of IV iron.  Please set this up.  Thanks.  Pete ?

## 2021-09-13 NOTE — Telephone Encounter (Signed)
-----   Message from [facility], LPN sent at [id number]/73/6681  9:32 AM EDT ----- ?Regarding: Iron apt needed ?Please call pt and schedule her for 1 dose of Venofer 300 mg. Pt is aware she needs this. Thanks! ? ?

## 2021-09-13 NOTE — Telephone Encounter (Signed)
-----   Message from [place], MD sent at 09/08/2021  4:46 PM EDT ----- ?Regarding: RE: [facility] Discharge Summary and CT Chest 06/16/21 ? ?----- Message ----- ?From: Margaretha Seeds, MD ?Sent: 08/13/2021   8:13 PM EDT ?To: Chi Rodman Pickle, MD, Darliss Ridgel, [place] ?Subject: [facility] Discharge Summary and CT Chest 12/22# ? ?We previously requested records from [facility] or her recent hospitalization in December but have not received it. ? ?Please again request CT chest 06/16/21 and hospital discharge summary. ? ?Thanks, ?JE ? ? ?

## 2021-09-13 NOTE — Telephone Encounter (Signed)
Called and informed patient of lab results, patient verbalized understanding and denies any questions or concerns at this time. Message sent to scheduling to have them call for an apt.  ? ?

## 2021-09-13 NOTE — Telephone Encounter (Signed)
Mailed new release to patient to fax to [facility]  ?

## 2021-09-15 DIAGNOSIS — H524 Presbyopia: Secondary | ICD-10-CM | POA: Diagnosis not present

## 2021-09-16 DIAGNOSIS — C3490 Malignant neoplasm of unspecified part of unspecified bronchus or lung: Secondary | ICD-10-CM | POA: Diagnosis not present

## 2021-09-16 DIAGNOSIS — Z961 Presence of intraocular lens: Secondary | ICD-10-CM | POA: Diagnosis not present

## 2021-09-16 DIAGNOSIS — H524 Presbyopia: Secondary | ICD-10-CM | POA: Diagnosis not present

## 2021-09-16 DIAGNOSIS — H52221 Regular astigmatism, right eye: Secondary | ICD-10-CM | POA: Diagnosis not present

## 2021-09-19 ENCOUNTER — Telehealth: Payer: Medicare HMO

## 2021-09-20 ENCOUNTER — Other Ambulatory Visit: Payer: Self-pay

## 2021-09-20 ENCOUNTER — Inpatient Hospital Stay: Payer: Medicare HMO

## 2021-09-20 VITALS — BP 131/39 | HR 70 | Temp 98.3°F | Resp 18

## 2021-09-20 DIAGNOSIS — R059 Cough, unspecified: Secondary | ICD-10-CM | POA: Diagnosis not present

## 2021-09-20 DIAGNOSIS — C3410 Malignant neoplasm of upper lobe, unspecified bronchus or lung: Secondary | ICD-10-CM

## 2021-09-20 DIAGNOSIS — J449 Chronic obstructive pulmonary disease, unspecified: Secondary | ICD-10-CM | POA: Diagnosis not present

## 2021-09-20 DIAGNOSIS — C3490 Malignant neoplasm of unspecified part of unspecified bronchus or lung: Secondary | ICD-10-CM | POA: Diagnosis not present

## 2021-09-20 DIAGNOSIS — D500 Iron deficiency anemia secondary to blood loss (chronic): Secondary | ICD-10-CM

## 2021-09-20 DIAGNOSIS — D509 Iron deficiency anemia, unspecified: Secondary | ICD-10-CM | POA: Diagnosis not present

## 2021-09-20 DIAGNOSIS — Z79899 Other long term (current) drug therapy: Secondary | ICD-10-CM | POA: Diagnosis not present

## 2021-09-20 MED ORDER — SODIUM CHLORIDE 0.9 % IV SOLN: Freq: Once | INTRAVENOUS | Status: AC

## 2021-09-20 MED ORDER — SODIUM CHLORIDE 0.9 % IV SOLN
300.0000 mg | Freq: Once | INTRAVENOUS | Status: AC
  Administered 2021-09-20: 300 mg via INTRAVENOUS
  Filled 2021-09-20: qty 300

## 2021-09-20 NOTE — Patient Instructions (Signed)

## 2021-09-23 ENCOUNTER — Telehealth: Payer: Self-pay | Admitting: *Deleted

## 2021-09-23 ENCOUNTER — Encounter: Payer: Self-pay | Admitting: *Deleted

## 2021-09-23 ENCOUNTER — Telehealth: Payer: Medicare HMO

## 2021-09-23 NOTE — Telephone Encounter (Signed)
?  Chronic Care Management  ? ?Follow Up Note ? ? ?09/23/2021 ?Name: Kaylee Hansen MRN: [id number] DOB: 1956-02-03 ? ?Referred by: Cassandria Anger, MD ?Reason for referral : Chronic Care Management (CCM RN CM Follow Up Outreach Attempt- unsuccessful attempt; COPD; Lung CA) ? ?An unsuccessful telephone outreach was attempted today. The patient was referred to the case management team for assistance with care management and care coordination.  ? ?Follow Up Plan:  ?A HIPPA compliant phone message was left for the patient providing contact information and requesting a return call ?Will place request with scheduling care guide to contact patient to re-schedule today's missed CCM RN follow up telephone appointment if I do not hear back from patient by end of day ? ?Oneta Rack, RN, BSN, CCRN Alumnus ?[place] ?[phone]: direct office ? ? ?

## 2021-10-04 ENCOUNTER — Other Ambulatory Visit: Payer: Self-pay | Admitting: Internal Medicine

## 2021-10-05 ENCOUNTER — Telehealth: Payer: Medicare HMO

## 2021-10-05 NOTE — Progress Notes (Deleted)
? ?Chronic Care Management ?Pharmacy Note ? ?10/05/2021 ?Name:  Kaylee Hansen MRN:  [id number] DOB:  August 04, 1955 ? ?Summary: ?-Patient recently seen in ER due to COPD exacerbation and fall while in oncology office, patient had been given prednisone and discharged with duonebs - reports that she has been doing better ?-Patient's husband Kaylee Hansen manages her medications, reports that patient never started [facility] as they never filled medication from pharmacy - suspect that copay may have been reason for medication not being started, chronic constipation has not been well managed on bisacodyl and docusate, patient stopped lactulose as she did not feel it was effective  ? ?Recommendations/Changes made from today's visit: ?- Recommending for patient to start Breo 100-35mg - 1 puff daily for her COPD due to continued issues with breathing/ COPD exacerbations - and she is not currently using a maintenance inhaler - previous on LABA/ICS combination in past, maintenance inhalers had been stopped in past due to cost per patient's husband ?-[employer] does not current have a patient assistance program, will trial for approval into the [employer] PAP as she has used in the past ?Recommend rechecking TSH, Vitamin D, and Lipid panel with next PCP visit  ?-Advised for patient to hold diphenhydramine at bedtime to help improve issues with daytime fatigue  ? ?Subjective: ?VSHELSY SENGis an 66y.o. year old female who is a primary patient of Plotnikov, AEvie Lacks MD.  The CCM team was consulted for assistance with disease management and care coordination needs.   ? ?Engaged with patient by telephone for follow up visit in response to provider referral for pharmacy case management and/or care coordination services.  ? ?Consent to Services:  ?The patient was given the following information about Chronic Care Management services today, agreed to services, and gave verbal consent: 1. CCM service includes personalized support from designated  clinical staff supervised by the primary care provider, including individualized plan of care and coordination with other care providers 2. 24/7 contact phone numbers for assistance for urgent and routine care needs. 3. Service will only be billed when office clinical staff spend 20 minutes or more in a month to coordinate care. 4. Only one practitioner may furnish and bill the service in a calendar month. 5.The patient may stop CCM services at any time (effective at the end of the month) by phone call to the office staff. 6. The patient will be responsible for cost sharing (co-pay) of up to 20% of the service fee (after annual deductible is met). Patient agreed to services and consent obtained. ? ?Patient Care Team: ?Plotnikov, AEvie Lacks MD as PCP - General ?HLeonie Man MD as PCP - Cardiology (Cardiology) ?GGatha Mayer MD (Gastroenterology) ?TCarolan Clines MD (Inactive) as Attending Physician (Urology) ?SErline Levine MD as Consulting Physician (Neurosurgery) ?TKnox Royalty RN as Case Manager ?STomasa Blase [facility] Pharmacist (Pharmacist) ? ?Recent office visits: ?09/07/2021 - Dr. PAlain Marion- [facility]- COPD exacerbation secondary to PNA - no changes to medications  ?06/09/2021 - Dr. PAlain Marion- COPD exacerbation - rx'd doxycycline - medrol dosepak ordered - depo-medrol given in office ?05/11/2021 - Dr. BQuay Burow- acute cystitis - cephalexin rx'd  ? ?Recent consult visits: ?09/12/2021 - Dr. EMarin Olp- Oncology - reports to congestion / cough with mucus production - also reports current UTI - bactrim rx'd - f/u in 4 weeks  ?08/11/2021 - Dr. ELoanne Drilling- Pulmonology - continue current medications - f/u in 3 months  ?07/12/2021 - Dr. ELoanne Drilling -  Pulmonology - initial visit - start breztri 2 puffs twice daily - PFTS ordered for next visit - f/u in 1 month ?06/13/2021 - Lottie Dawson NP - Agenda - no changes to medications - f/u in 3 months  ? ?Hospital visits: ?01/26/2021 -  ED visit - discharged 01/26/2021 - visit for chest tightness and SOB, EKG completed, NSR, RBB block, anterolateral infarct, advised for admission to hospital which patient and husband declined - Dcd home on short course of steroid and duonebs - had been given furosemide in ED due to mildly elevated BNP, ? ?10/06/2020 - ED visit to [facility] emergency center due to laceration of scalp - no notes available - discharged same day  ? ? ?Objective: ? ?Lab Results  ?Component Value Date  ? CREATININE 1.00 09/12/2021  ? BUN 13 09/12/2021  ? GFR 45.32 (L) 07/19/2020  ? GFRNONAA >60 09/12/2021  ? GFRAA >60 03/25/2020  ? NA 142 09/12/2021  ? K 4.5 09/12/2021  ? CALCIUM 9.3 09/12/2021  ? CO2 32 09/12/2021  ? GLUCOSE 114 (H) 09/12/2021  ? ? ?Lab Results  ?Component Value Date/Time  ? HGBA1C  09/17/2008 04:00 PM  ?  5.0 ?(NOTE)   The ADA recommends the following therapeutic goal for glycemic   control related to Hgb A1C measurement:   Goal of Therapy:   < 7.0% Hgb A1C   Reference: American Diabetes Association: Clinical Practice   Recommendations 2008, Diabetes Care,  ?2008, 31:(Suppl 1).  ? GFR 45.32 (L) 07/19/2020 11:57 AM  ? GFR 59.75 (L) 01/01/2019 12:01 PM  ?  ?Last diabetic Eye exam:  ?No results found for: HMDIABEYEEXA  ?Last diabetic Foot exam:  ?No results found for: HMDIABFOOTEX  ? ?Lab Results  ?Component Value Date  ? CHOL 128 06/29/2018  ? HDL 35 (L) 06/29/2018  ? [place] 56 06/29/2018  ? LDLDIRECT 187.0 07/14/2015  ? TRIG 184 (H) 06/29/2018  ? CHOLHDL 3.7 06/29/2018  ? ? ? ?  Latest Ref Rng & Units 09/12/2021  ? 11:27 AM 06/13/2021  ?  1:56 PM 04/12/2021  ?  1:01 PM  ?Hepatic Function  ?Total Protein 6.5 - 8.1 g/dL 6.5   6.5   6.5    ?Albumin 3.5 - 5.0 g/dL 3.7   3.5   3.6    ?AST 15 - 41 U/L _0 ?ALT 0 - 44 U/L _1 ?Alk Phosphatase 38 - 126 U/L 135   167   126    ?Total Bilirubin 0.3 - 1.2 mg/dL 0.2   0.2   0.2    ? ? ?Lab Results  ?Component Value Date/Time  ? TSH 1.84 01/01/2019 12:01 PM  ? TSH  0.46 07/14/2015 10:59 AM  ? ? ? ?  Latest Ref Rng & Units 09/12/2021  ? 11:27 AM 06/13/2021  ?  1:56 PM 04/12/2021  ?  1:01 PM  ?CBC  ?WBC 4.0 - 10.5 K/uL 7.1   6.5   6.2    ?Hemoglobin 12.0 - 15.0 g/dL 10.1   10.0   10.3    ?Hematocrit 36.0 - 46.0 % 32.7   32.0   33.0    ?Platelets 150 - 400 K/uL 262   237   215    ? ? ?Lab Results  ?Component Value Date/Time  ? VD25OH 8.82 (L) 07/14/2015 10:59 AM  ? ? ?Clinical ASCVD: Yes  ?The ASCVD Risk score (Arnett  DK, et al., 2019) failed to calculate for the following reasons: ?  The patient has a prior MI or stroke diagnosis   ? ? ?  01/24/2021  ? 11:30 AM 01/28/2020  ? 11:49 AM 10/08/2019  ?  3:13 PM  ?Depression screen PHQ 2/9  ?Decreased Interest 1 0 2  ?Down, Depressed, Hopeless _0 ?PHQ - 2 Score _1 ?Altered sleeping 1  3  ?Tired, decreased energy 2  3  ?Change in appetite 1  0  ?Feeling bad or failure about yourself    0  ?Trouble concentrating 1  1  ?Moving slowly or fidgety/restless 1  3  ?Suicidal thoughts 1  0  ?PHQ-9 Score 9  13  ?Difficult doing work/chores Somewhat difficult  Not difficult at all  ?  ?Social History  ? ?Tobacco Use  ?Smoking Status Every Day  ? Packs/day: 1.00  ? Years: 48.00  ? Pack years: 48.00  ? Types: Cigarettes  ?Smokeless Tobacco Never  ?Tobacco Comments  ? 09/21/20 8cigs/day.  ? ?BP Readings from Last 3 Encounters:  ?09/20/21 (!) 131/39  ?09/12/21 (!) 105/31  ?09/07/21 (!) 120/40  ? ?Pulse Readings from Last 3 Encounters:  ?09/20/21 70  ?09/12/21 76  ?09/07/21 84  ? ?Wt Readings from Last 3 Encounters:  ?09/12/21 149 lb (67.6 kg)  ?09/07/21 151 lb (68.5 kg)  ?08/11/21 151 lb 6.4 oz (68.7 kg)  ? ?BMI Readings from Last 3 Encounters:  ?09/12/21 24.79 kg/m?  ?09/07/21 25.13 kg/m?  ?08/11/21 25.19 kg/m?  ? ? ?Assessment/Interventions: Review of patient past medical history, allergies, medications, health status, including review of consultants reports, laboratory and other test data, was performed as part of comprehensive evaluation and  provision of chronic care management services.  ? ?SDOH:  (Social Determinants of Health) assessments and interventions performed: Yes ? ?SDOH Screenings  ? ?Alcohol Screen: Not on file  ?Depression (PHQ2-9

## 2021-10-13 ENCOUNTER — Ambulatory Visit (HOSPITAL_COMMUNITY)
Admission: RE | Admit: 2021-10-13 | Discharge: 2021-10-13 | Disposition: A | Discharge status: Home / Self Care | Payer: Medicare HMO | Source: Ambulatory Visit | Attending: Hematology & Oncology | Admitting: Hematology & Oncology

## 2021-10-13 DIAGNOSIS — I700 Atherosclerosis of aorta: Secondary | ICD-10-CM | POA: Diagnosis not present

## 2021-10-13 DIAGNOSIS — I2510 Atherosclerotic heart disease of native coronary artery without angina pectoris: Secondary | ICD-10-CM | POA: Diagnosis not present

## 2021-10-13 DIAGNOSIS — C3490 Malignant neoplasm of unspecified part of unspecified bronchus or lung: Secondary | ICD-10-CM | POA: Diagnosis not present

## 2021-10-13 DIAGNOSIS — J90 Pleural effusion, not elsewhere classified: Secondary | ICD-10-CM | POA: Insufficient documentation

## 2021-10-13 DIAGNOSIS — Z923 Personal history of irradiation: Secondary | ICD-10-CM | POA: Insufficient documentation

## 2021-10-13 DIAGNOSIS — D500 Iron deficiency anemia secondary to blood loss (chronic): Secondary | ICD-10-CM | POA: Diagnosis present

## 2021-10-13 DIAGNOSIS — I358 Other nonrheumatic aortic valve disorders: Secondary | ICD-10-CM | POA: Diagnosis not present

## 2021-10-13 LAB — GLUCOSE, CAPILLARY: Glucose-Capillary: 92 mg/dL (ref 70–99)

## 2021-10-13 MED ORDER — FLUDEOXYGLUCOSE F - 18 (FDG) INJECTION
8.1000 | Freq: Once | INTRAVENOUS | Status: AC | PRN
  Administered 2021-10-13: 8.1 via INTRAVENOUS

## 2021-10-14 ENCOUNTER — Encounter: Payer: Self-pay | Admitting: Hematology & Oncology

## 2021-10-14 ENCOUNTER — Other Ambulatory Visit: Payer: Self-pay

## 2021-10-14 ENCOUNTER — Inpatient Hospital Stay: Payer: Medicare HMO | Attending: Hematology & Oncology

## 2021-10-14 ENCOUNTER — Inpatient Hospital Stay: Payer: Medicare HMO | Admitting: Hematology & Oncology

## 2021-10-14 VITALS — BP 112/30 | HR 80 | Temp 98.1°F | Resp 19 | Wt 145.0 lb

## 2021-10-14 DIAGNOSIS — D509 Iron deficiency anemia, unspecified: Secondary | ICD-10-CM | POA: Diagnosis not present

## 2021-10-14 DIAGNOSIS — C3490 Malignant neoplasm of unspecified part of unspecified bronchus or lung: Secondary | ICD-10-CM

## 2021-10-14 DIAGNOSIS — Z923 Personal history of irradiation: Secondary | ICD-10-CM | POA: Insufficient documentation

## 2021-10-14 DIAGNOSIS — D500 Iron deficiency anemia secondary to blood loss (chronic): Secondary | ICD-10-CM

## 2021-10-14 DIAGNOSIS — Z85118 Personal history of other malignant neoplasm of bronchus and lung: Secondary | ICD-10-CM | POA: Insufficient documentation

## 2021-10-14 LAB — CBC WITH DIFFERENTIAL (CANCER CENTER ONLY)
Abs Immature Granulocytes: 0.02 10*3/uL (ref 0.00–0.07)
Basophils Absolute: 0.1 10*3/uL (ref 0.0–0.1)
Basophils Relative: 1 %
Eosinophils Absolute: 0.2 10*3/uL (ref 0.0–0.5)
Eosinophils Relative: 2 %
HCT: 34.3 % — ABNORMAL LOW (ref 36.0–46.0)
Hemoglobin: 10.6 g/dL — ABNORMAL LOW (ref 12.0–15.0)
Immature Granulocytes: 0 %
Lymphocytes Relative: 19 %
Lymphs Abs: 1.3 10*3/uL (ref 0.7–4.0)
MCH: 30.6 pg (ref 26.0–34.0)
MCHC: 30.9 g/dL (ref 30.0–36.0)
MCV: 99.1 fL (ref 80.0–100.0)
Monocytes Absolute: 0.6 10*3/uL (ref 0.1–1.0)
Monocytes Relative: 8 %
Neutro Abs: 4.8 10*3/uL (ref 1.7–7.7)
Neutrophils Relative %: 70 %
Platelet Count: 243 10*3/uL (ref 150–400)
RBC: 3.46 MIL/uL — ABNORMAL LOW (ref 3.87–5.11)
RDW: 14.6 % (ref 11.5–15.5)
WBC Count: 6.9 10*3/uL (ref 4.0–10.5)
nRBC: 0 % (ref 0.0–0.2)

## 2021-10-14 LAB — CMP (CANCER CENTER ONLY)
ALT: 6 U/L (ref 0–44)
AST: 10 U/L — ABNORMAL LOW (ref 15–41)
Albumin: 3.6 g/dL (ref 3.5–5.0)
Alkaline Phosphatase: 125 U/L (ref 38–126)
Anion gap: 6 (ref 5–15)
BUN: 12 mg/dL (ref 8–23)
CO2: 33 mmol/L — ABNORMAL HIGH (ref 22–32)
Calcium: 9.2 mg/dL (ref 8.9–10.3)
Chloride: 103 mmol/L (ref 98–111)
Creatinine: 0.96 mg/dL (ref 0.44–1.00)
GFR, Estimated: >60 mL/min (ref >60)
Glucose, Bld: 106 mg/dL — ABNORMAL HIGH (ref 70–99)
Potassium: 4.4 mmol/L (ref 3.5–5.1)
Sodium: 142 mmol/L (ref 135–145)
Total Bilirubin: 0.2 mg/dL — ABNORMAL LOW (ref 0.3–1.2)
Total Protein: 6.7 g/dL (ref 6.5–8.1)

## 2021-10-14 LAB — LACTATE DEHYDROGENASE: LDH: 121 U/L (ref 98–192)

## 2021-10-14 LAB — FERRITIN: Ferritin: 56 ng/mL (ref 11–307)

## 2021-10-14 NOTE — Progress Notes (Signed)
?Hematology and Oncology Follow Up Visit ? ?[place] ?[id number] ?04-25-1956 66 y.o. ?10/14/2021 ? ? 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  ?Past Therapy:        ?SBRT -- s/p 5400 rad -- finished on 11/13/2019 ?  ?Current Therapy: ?IV iron as indicated  ?  ?Interim History:  Kaylee Hansen is here today with her husband for follow-up.  We did get a PET scan on her.  The report came back after she left.  Unfortunately, it looks like we have a new problem.  She has a hypermetabolic soft tissue area in the superior portion of the right lower lobe adjacent to right paramediastinal lymph nodes.  This is concerning for recurrent disease.  She has uptake in the right hilum. ? ?I am unsure how we will be able to handle this.  She is just not a great candidate for any systemic therapy.  She comes in in a wheelchair. ? ?She is going to need some oxygen at home.  She apparently has oxygen but there is no portable oxygen for her. ? ?She also had problems with iron deficiency.  We saw her, her ferritin was only 56 with an iron saturation of 17%. ? ?Again, our issue is going to be how to try to help with this area of recurrence.  I suspect that were probably going to have to somehow get a proctoscopy on her.  Again I am not sure how well she is going to tolerate this.  We really going to have to get biopsies to see if we might be able to have any count of targeted mutations that we might be able to take advantage of. ? ?Her appetite is doing okay.  She has had no nausea or vomiting.  She has had no leg swelling.  She has had no problems with pain. ? ?Overall, I would say performance status is probably ECOG 2-3.   ? ?Medications:  ?Allergies as of 10/14/2021   ? ?   Reactions  ? Nitrofuran Derivatives Other (See Comments)  ? confusion  ? Oxycodone Hcl Other (See Comments)  ? Knocked her out for 6 days  ? Morphine Other (See Comments)  ? "Makes me go crazy."   ?  Penicillins Hives  ? Has patient had a PCN reaction causing immediate rash, facial/tongue/throat swelling, SOB or lightheadedness with hypotension: unknown ?Has patient had a PCN reaction causing severe rash involving mucus membranes or skin necrosis: unknown ?Has patient had a PCN reaction that required hospitalization No ?Has patient had a PCN reaction occurring within the last 10 years: No ?If all of the above answers are "NO", then may proceed with Cephalosporin use. ?Other reaction(s): Rash-Generalized  ? Tape Other (See Comments)  ? Skin turns red and burns  ? ?  ? ?  ?Medication List  ?  ? ?  ? Accurate as of October 14, 2021  3:58 PM. If you have any questions, ask your nurse or doctor.  ?  ?  ? ?  ? ?albuterol 108 (90 Base) MCG/ACT inhaler ?Commonly known as: VENTOLIN HFA ?Inhale 2 puffs into the lungs every 6 (six) hours as needed for wheezing or shortness of breath. ?  ?albuterol (2.5 MG/3ML) 0.083% nebulizer solution ?Commonly known as: PROVENTIL ?INHALE 1 VIAL BY NEBULIZATION EVERY 4 HOURS AS NEEDED FOR WHEEZING OR SHORTNESS OF BREATH. ?  ?ALPRAZolam 0.25 MG tablet ?Commonly known as: Duanne Moron ?Take 1 tablet (0.25 mg total)  by mouth at bedtime as needed for anxiety. ?  ?aspirin EC 81 MG tablet ?Take 1 tablet (81 mg total) by mouth daily. ?  ?bisacodyl 5 MG EC tablet ?Commonly known as: Dulcolax ?Take 2 tablets (10 mg total) by mouth at bedtime. ?  ?Breztri Aerosphere 160-9-4.8 MCG/ACT Aero ?Generic drug: Budeson-Glycopyrrol-Formoterol ?Inhale 2 puffs into the lungs in the morning and at bedtime. ?  ?calcium carbonate 500 MG chewable tablet ?Commonly known as: TUMS - dosed in mg elemental calcium ?Chew 1 tablet by mouth daily as needed for indigestion or heartburn. ?  ?CoQ10 100 MG Caps ?Take 100 mg by mouth in the morning. ?  ?diltiazem 360 MG 24 hr capsule ?Commonly known as: CARDIZEM CD ?TAKE 1 CAPSULE BY MOUTH EVERY DAY ?  ?docusate sodium 100 MG capsule ?Commonly known as: COLACE ?Take 100 mg by mouth  daily as needed for mild constipation or moderate constipation. ?  ?doxycycline 100 MG tablet ?Commonly known as: VIBRA-TABS ?Take 1 tablet (100 mg total) by mouth 2 (two) times daily. ?  ?fluconazole 150 MG tablet ?Commonly known as: DIFLUCAN ?fluconazole 150 mg tablet ? TAKE 1 TABLET (150 MG TOTAL) BY MOUTH ONCE FOR 1 DOSE. ?  ?[name] ?As directed ?  ?furosemide 20 MG tablet ?Commonly known as: LASIX ?TAKE 1 TABLET BY MOUTH EVERY DAY ?  ?guaiFENesin 100 MG/5ML liquid ?Commonly known as: ROBITUSSIN ?Take 200 mg by mouth 2 (two) times daily as needed for cough. ?  ?HYDROcodone-acetaminophen 10-325 MG tablet ?Commonly known as: NORCO ?Take 1 tablet by mouth every 6 (six) hours as needed for severe pain. ?  ?HYDROcodone-acetaminophen 10-325 MG tablet ?Commonly known as: NORCO ?Take 1 tablet by mouth every 6 (six) hours as needed for severe pain. ?  ?HYDROcodone-acetaminophen 10-325 MG tablet ?Commonly known as: NORCO ?Take 1 tablet by mouth every 6 (six) hours as needed for up to 5 days for severe pain. ?  ?hydroxypropyl methylcellulose / hypromellose 2.5 % ophthalmic solution ?Commonly known as: ISOPTO TEARS / GONIOVISC ?Place 1 drop into both eyes 3 (three) times daily as needed for dry eyes. ?  ?ipratropium-albuterol 0.5-2.5 (3) MG/3ML Soln ?Commonly known as: DUONEB ?Take 3 mLs by nebulization every 4 (four) hours as needed. ?  ?isosorbide mononitrate 120 MG 24 hr tablet ?Commonly known as: IMDUR ?TAKE 1 TABLET (120 MG TOTAL) BY MOUTH DAILY. ?  ?ketoconazole 2 % cream ?Commonly known as: NIZORAL ?APPLY TO AFFECTED AREA EVERY DAY ?What changed:  ?how much to take ?how to take this ?when to take this ?reasons to take this ?  ?levothyroxine 150 MCG tablet ?Commonly known as: SYNTHROID ?TAKE 1 TABLET BY MOUTH EVERY DAY ?  ?methylPREDNISolone 4 MG Tbpk tablet ?Commonly known as: MEDROL DOSEPAK ?As directed ?  ?naloxegol oxalate 12.5 MG Tabs tablet ?Commonly known as: MOVANTIK ?Take 1 tablet (12.5 mg total) by  mouth daily. ?  ?naproxen sodium 220 MG tablet ?Commonly known as: ALEVE ?Take 220 mg by mouth daily as needed (For Headache). ?  ?nitroGLYCERIN 0.4 MG SL tablet ?Commonly known as: NITROSTAT ?DISSOLVE ONE TABLET UNDER THE TONGUE EVERY 5 MINUTES AS NEEDED FOR CHEST PAIN.  DO NOT EXCEED A TOTAL OF 3 DOSES IN 15 MINUTES ?  ?OXYGEN ?Inhale 2.5 L/min into the lungs at bedtime. ?  ?pantoprazole 40 MG tablet ?Commonly known as: PROTONIX ?TAKE 1 TABLET BY MOUTH DAILY BEFORE BREAKFAST ?  ?PARoxetine 40 MG tablet ?Commonly known as: PAXIL ?TAKE 1 TABLET BY MOUTH EVERY DAY ?  ?phenytoin  100 MG ER capsule ?Commonly known as: DILANTIN ?TAKE 100 MG EVERY MORNING AND 200 MG EVERY NIGHT. ?  ?promethazine 25 MG tablet ?Commonly known as: PHENERGAN ?Take 1 tablet (25 mg total) by mouth every 8 (eight) hours as needed for nausea or vomiting. ?  ?rosuvastatin 20 MG tablet ?Commonly known as: CRESTOR ?TAKE 1 TABLET BY MOUTH EVERY DAY ?  ?sulfamethoxazole-trimethoprim 800-160 MG tablet ?Commonly known as: BACTRIM DS ?Take 1 tablet by mouth 2 (two) times daily. ?  ?SUMAtriptan 100 MG tablet ?Commonly known as: IMITREX ?MAY REPEAT IN 2 HOURS IF HEADACHE PERSISTS OR RECURS. ?  ?traZODone 100 MG tablet ?Commonly known as: DESYREL ?TAKE 1 TABLET BY MOUTH EVERYDAY AT BEDTIME ?  ?Vitamin D (Ergocalciferol) 1.25 MG (50000 UNIT) Caps capsule ?Commonly known as: DRISDOL ?TAKE 1 CAPSULE (50,000 UNITS TOTAL) BY MOUTH EVERY MONDAY. ?  ?ZzzQuil 25 MG Caps ?Generic drug: diphenhydrAMINE HCl (Sleep) ?Take 50 mg by mouth at bedtime. ?  ? ?  ? ? ?Allergies:  ?Allergies  ?Allergen Reactions  ? Nitrofuran Derivatives Other (See Comments)  ?  confusion  ? Oxycodone Hcl Other (See Comments)  ?  Knocked her out for 6 days  ? Morphine Other (See Comments)  ?  "Makes me go crazy."   ? Penicillins Hives  ?  Has patient had a PCN reaction causing immediate rash, facial/tongue/throat swelling, SOB or lightheadedness with hypotension: unknown ?Has patient had a PCN  reaction causing severe rash involving mucus membranes or skin necrosis: unknown ?Has patient had a PCN reaction that required hospitalization No ?Has patient had a PCN reaction occurring within the last 10 yea

## 2021-10-17 ENCOUNTER — Inpatient Hospital Stay (HOSPITAL_BASED_OUTPATIENT_CLINIC_OR_DEPARTMENT_OTHER): Payer: Medicare HMO | Admitting: Hematology & Oncology

## 2021-10-17 ENCOUNTER — Encounter: Payer: Self-pay | Admitting: Family

## 2021-10-17 ENCOUNTER — Encounter: Payer: Self-pay | Admitting: Hematology & Oncology

## 2021-10-17 VITALS — BP 115/30 | HR 79 | Temp 98.1°F | Resp 24

## 2021-10-17 DIAGNOSIS — D500 Iron deficiency anemia secondary to blood loss (chronic): Secondary | ICD-10-CM | POA: Diagnosis not present

## 2021-10-17 DIAGNOSIS — C3490 Malignant neoplasm of unspecified part of unspecified bronchus or lung: Secondary | ICD-10-CM

## 2021-10-17 DIAGNOSIS — D509 Iron deficiency anemia, unspecified: Secondary | ICD-10-CM | POA: Diagnosis not present

## 2021-10-17 DIAGNOSIS — Z85118 Personal history of other malignant neoplasm of bronchus and lung: Secondary | ICD-10-CM | POA: Diagnosis not present

## 2021-10-17 DIAGNOSIS — Z923 Personal history of irradiation: Secondary | ICD-10-CM | POA: Diagnosis not present

## 2021-10-17 LAB — IRON AND IRON BINDING CAPACITY (CC-WL,HP ONLY)
Iron: 40 ug/dL (ref 28–170)
Saturation Ratios: 17 % (ref 10.4–31.8)
TIBC: 230 ug/dL — ABNORMAL LOW (ref 250–450)
UIBC: 190 ug/dL (ref 148–442)

## 2021-10-17 MED ORDER — TOPIRAMATE 50 MG PO TABS: 50.0000 mg | ORAL_TABLET | Freq: Two times a day (BID) | ORAL | 2 refills | Status: DC | PRN

## 2021-10-17 NOTE — Progress Notes (Signed)
?Hematology and Oncology Follow Up Visit ? ?[place] ?[id number] ?03-04-1956 66 y.o. ?10/17/2021 ? ? 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  ?Past Therapy:        ?SBRT -- s/p 5400 rad -- finished on 11/13/2019 ?  ?Current Therapy: ?IV iron as indicated  ?  ?Interim History:  Kaylee Hansen is here today with her husband for follow-up.  We did get a PET scan on her.  The report came back after she left.  Unfortunately, it looks like we have a new problem.  She has a hypermetabolic soft tissue area in the superior portion of the right lower lobe adjacent to right paramediastinal lymph nodes.  This is concerning for recurrent disease.  She has uptake in the right hilum. ? ?I went to speak with Dr. Sondra Come of Radiation Oncology.  Maybe, we will be able to see if radiosurgery could be utilized.  The area of activities in the right lower lobe.  There is some activity in the hilum but there is no corresponding soft tissue in that area. ? ?Her iron saturation is still low.  We will get to see about getting her some IV iron. ? ?Unfortunately, she is still smoking.  I am sure that this is part of the reason why she has this new area in the right lung. ? ?She still has issues with respect to her underlying lung disease.  Not sure that she would be able to have any kind of biopsy.  I think if she underwent a biopsy and developed a pneumothorax, this would certainly be quite serious for her. ? ?Currently, I would have said that her overall performance status is probably ECOG 3. ?  ? ?Medications:  ?Allergies as of 10/17/2021   ? ?   Reactions  ? Nitrofuran Derivatives Other (See Comments)  ? confusion  ? Oxycodone Hcl Other (See Comments)  ? Knocked her out for 6 days  ? Morphine Other (See Comments)  ? "Makes me go crazy."   ? Penicillins Hives  ? Has patient had a PCN reaction causing immediate rash, facial/tongue/throat swelling, SOB or lightheadedness with  hypotension: unknown ?Has patient had a PCN reaction causing severe rash involving mucus membranes or skin necrosis: unknown ?Has patient had a PCN reaction that required hospitalization No ?Has patient had a PCN reaction occurring within the last 10 years: No ?If all of the above answers are "NO", then may proceed with Cephalosporin use. ?Other reaction(s): Rash-Generalized  ? Tape Other (See Comments)  ? Skin turns red and burns  ? ?  ? ?  ?Medication List  ?  ? ?  ? Accurate as of October 17, 2021  4:29 PM. If you have any questions, ask your nurse or doctor.  ?  ?  ? ?  ? ?STOP taking these medications   ? ?docusate sodium 100 MG capsule ?Commonly known as: COLACE ?Stopped by: [name], MD ?  ?doxycycline 100 MG tablet ?Commonly known as: VIBRA-TABS ?Stopped by: [name], MD ?  ?fluconazole 150 MG tablet ?Commonly known as: DIFLUCAN ?Stopped by: [name], MD ?  ?methylPREDNISolone 4 MG Tbpk tablet ?Commonly known as: MEDROL DOSEPAK ?Stopped by: [name], MD ?  ?sulfamethoxazole-trimethoprim 800-160 MG tablet ?Commonly known as: BACTRIM DS ?Stopped by: [name], MD ?  ? ?  ? ?TAKE these medications   ? ?albuterol 108 (90 Base) MCG/ACT inhaler ?Commonly known as: VENTOLIN  HFA ?Inhale 2 puffs into the lungs every 6 (six) hours as needed for wheezing or shortness of breath. ?  ?albuterol (2.5 MG/3ML) 0.083% nebulizer solution ?Commonly known as: PROVENTIL ?INHALE 1 VIAL BY NEBULIZATION EVERY 4 HOURS AS NEEDED FOR WHEEZING OR SHORTNESS OF BREATH. ?  ?ALPRAZolam 0.25 MG tablet ?Commonly known as: Duanne Moron ?Take 1 tablet (0.25 mg total) by mouth at bedtime as needed for anxiety. ?  ?aspirin EC 81 MG tablet ?Take 1 tablet (81 mg total) by mouth daily. ?  ?bisacodyl 5 MG EC tablet ?Commonly known as: Dulcolax ?Take 2 tablets (10 mg total) by mouth at bedtime. ?  ?Breztri Aerosphere 160-9-4.8 MCG/ACT Aero ?Generic drug: Budeson-Glycopyrrol-Formoterol ?Inhale 2 puffs into the lungs in the  morning and at bedtime. ?  ?calcium carbonate 500 MG chewable tablet ?Commonly known as: TUMS - dosed in mg elemental calcium ?Chew 1 tablet by mouth daily as needed for indigestion or heartburn. ?  ?CoQ10 100 MG Caps ?Take 100 mg by mouth in the morning. ?  ?diltiazem 360 MG 24 hr capsule ?Commonly known as: CARDIZEM CD ?TAKE 1 CAPSULE BY MOUTH EVERY DAY ?  ?[name] ?As directed ?  ?furosemide 20 MG tablet ?Commonly known as: LASIX ?TAKE 1 TABLET BY MOUTH EVERY DAY ?  ?guaiFENesin 100 MG/5ML liquid ?Commonly known as: ROBITUSSIN ?Take 200 mg by mouth 2 (two) times daily as needed for cough. ?  ?HYDROcodone-acetaminophen 10-325 MG tablet ?Commonly known as: NORCO ?Take 1 tablet by mouth every 6 (six) hours as needed for severe pain. ?  ?HYDROcodone-acetaminophen 10-325 MG tablet ?Commonly known as: NORCO ?Take 1 tablet by mouth every 6 (six) hours as needed for severe pain. ?  ?HYDROcodone-acetaminophen 10-325 MG tablet ?Commonly known as: NORCO ?Take 1 tablet by mouth every 6 (six) hours as needed for up to 5 days for severe pain. ?  ?hydroxypropyl methylcellulose / hypromellose 2.5 % ophthalmic solution ?Commonly known as: ISOPTO TEARS / GONIOVISC ?Place 1 drop into both eyes 3 (three) times daily as needed for dry eyes. ?  ?ipratropium-albuterol 0.5-2.5 (3) MG/3ML Soln ?Commonly known as: DUONEB ?Take 3 mLs by nebulization every 4 (four) hours as needed. ?  ?isosorbide mononitrate 120 MG 24 hr tablet ?Commonly known as: IMDUR ?TAKE 1 TABLET (120 MG TOTAL) BY MOUTH DAILY. ?  ?ketoconazole 2 % cream ?Commonly known as: NIZORAL ?APPLY TO AFFECTED AREA EVERY DAY ?What changed:  ?how much to take ?how to take this ?when to take this ?reasons to take this ?  ?levothyroxine 150 MCG tablet ?Commonly known as: SYNTHROID ?TAKE 1 TABLET BY MOUTH EVERY DAY ?  ?naloxegol oxalate 12.5 MG Tabs tablet ?Commonly known as: MOVANTIK ?Take 1 tablet (12.5 mg total) by mouth daily. ?  ?naproxen sodium 220 MG tablet ?Commonly  known as: ALEVE ?Take 220 mg by mouth daily as needed (For Headache). ?  ?nitroGLYCERIN 0.4 MG SL tablet ?Commonly known as: NITROSTAT ?DISSOLVE ONE TABLET UNDER THE TONGUE EVERY 5 MINUTES AS NEEDED FOR CHEST PAIN.  DO NOT EXCEED A TOTAL OF 3 DOSES IN 15 MINUTES ?  ?OXYGEN ?Inhale 2.5 L/min into the lungs at bedtime. ?  ?pantoprazole 40 MG tablet ?Commonly known as: PROTONIX ?TAKE 1 TABLET BY MOUTH DAILY BEFORE BREAKFAST ?  ?PARoxetine 40 MG tablet ?Commonly known as: PAXIL ?TAKE 1 TABLET BY MOUTH EVERY DAY ?  ?phenytoin 100 MG ER capsule ?Commonly known as: DILANTIN ?TAKE 100 MG EVERY MORNING AND 200 MG EVERY NIGHT. ?  ?promethazine 25 MG tablet ?Commonly  known as: PHENERGAN ?Take 1 tablet (25 mg total) by mouth every 8 (eight) hours as needed for nausea or vomiting. ?  ?rosuvastatin 20 MG tablet ?Commonly known as: CRESTOR ?TAKE 1 TABLET BY MOUTH EVERY DAY ?  ?SUMAtriptan 100 MG tablet ?Commonly known as: IMITREX ?MAY REPEAT IN 2 HOURS IF HEADACHE PERSISTS OR RECURS. ?  ?topiramate 50 MG tablet ?Commonly known as: Topamax ?Take 1 tablet (50 mg total) by mouth 2 (two) times daily as needed. Take as needed for headache ?Started by: [name], MD ?  ?traZODone 100 MG tablet ?Commonly known as: DESYREL ?TAKE 1 TABLET BY MOUTH EVERYDAY AT BEDTIME ?  ?Vitamin D (Ergocalciferol) 1.25 MG (50000 UNIT) Caps capsule ?Commonly known as: DRISDOL ?TAKE 1 CAPSULE (50,000 UNITS TOTAL) BY MOUTH EVERY MONDAY. ?  ?ZzzQuil 25 MG Caps ?Generic drug: diphenhydrAMINE HCl (Sleep) ?Take 50 mg by mouth at bedtime. ?  ? ?  ? ? ?Allergies:  ?Allergies  ?Allergen Reactions  ? Nitrofuran Derivatives Other (See Comments)  ?  confusion  ? Oxycodone Hcl Other (See Comments)  ?  Knocked her out for 6 days  ? Morphine Other (See Comments)  ?  "Makes me go crazy."   ? Penicillins Hives  ?  Has patient had a PCN reaction causing immediate rash, facial/tongue/throat swelling, SOB or lightheadedness with hypotension: unknown ?Has patient had a  PCN reaction causing severe rash involving mucus membranes or skin necrosis: unknown ?Has patient had a PCN reaction that required hospitalization No ?Has patient had a PCN reaction occurring within the last 10 years

## 2021-10-18 ENCOUNTER — Telehealth: Payer: Self-pay | Admitting: Hematology & Oncology

## 2021-10-18 ENCOUNTER — Telehealth: Payer: Self-pay | Admitting: *Deleted

## 2021-10-18 NOTE — Telephone Encounter (Signed)
Called to schedule per 4/24 los , left voicemail for patient to call us back to schedule iron IV next week ?

## 2021-10-18 NOTE — Chronic Care Management (AMB) (Signed)
?  Care Management  ? ?Note ? ?10/18/2021 ?Name: NALIAH EDDINGTON MRN: [id number] DOB: [birth year] ? ?SHEVAWN LANGENBERG is a 66 y.o. year old female who is a primary care patient of Plotnikov, [name], MD and is actively engaged with the care management team. I reached out to [employer] by phone today to assist with re-scheduling a follow up visit with the RN Case Manager ? ?Follow up plan: ?Unsuccessful telephone outreach attempt made. A HIPAA compliant phone message was left for the patient providing contact information and requesting a return call.  ?The care management team will reach out to the patient again over the next 7 days.  ?If patient returns call to provider office, please advise to call [facility]  at [phone]. ? ?Laverda Sorenson  ?Care Guide, Embedded Care Coordination ?[facility]  Care Management  ?Direct Dial: [phone] ? ?

## 2021-10-19 ENCOUNTER — Ambulatory Visit (INDEPENDENT_AMBULATORY_CARE_PROVIDER_SITE_OTHER): Payer: Medicare HMO | Admitting: Internal Medicine

## 2021-10-19 ENCOUNTER — Other Ambulatory Visit: Payer: Self-pay

## 2021-10-19 ENCOUNTER — Encounter: Payer: Self-pay | Admitting: Internal Medicine

## 2021-10-19 VITALS — BP 120/36 | HR 77 | Temp 97.7°F | Ht 65.0 in | Wt 149.4 lb

## 2021-10-19 DIAGNOSIS — G43109 Migraine with aura, not intractable, without status migrainosus: Secondary | ICD-10-CM | POA: Diagnosis not present

## 2021-10-19 DIAGNOSIS — N3001 Acute cystitis with hematuria: Secondary | ICD-10-CM | POA: Diagnosis not present

## 2021-10-19 DIAGNOSIS — R300 Dysuria: Secondary | ICD-10-CM

## 2021-10-19 LAB — POC URINALSYSI DIPSTICK (AUTOMATED)
Bilirubin, UA: NEGATIVE
Glucose, UA: NEGATIVE
Ketones, UA: NEGATIVE
Nitrite, UA: POSITIVE
Protein, UA: POSITIVE — AB
Spec Grav, UA: 1.025 (ref 1.010–1.025)
Urobilinogen, UA: 0.2 E.U./dL
pH, UA: 6 (ref 5.0–8.0)

## 2021-10-19 MED ORDER — PROMETHAZINE HCL 25 MG/ML IJ SOLN
25.0000 mg | Freq: Four times a day (QID) | INTRAMUSCULAR | Status: DC | PRN
  Administered 2021-10-19: 25 mg via INTRAMUSCULAR

## 2021-10-19 MED ORDER — METHYLPREDNISOLONE ACETATE 80 MG/ML IJ SUSP
80.0000 mg | Freq: Once | INTRAMUSCULAR | Status: AC
  Administered 2021-10-19: 80 mg via INTRAMUSCULAR

## 2021-10-19 MED ORDER — CEPHALEXIN 500 MG PO CAPS: 500.0000 mg | ORAL_CAPSULE | Freq: Two times a day (BID) | ORAL | 0 refills | Status: DC

## 2021-10-19 NOTE — Patient Instructions (Addendum)
? ?  You received an injection today for your headache ? ? ?Medications changes include :   keflex 500 mg twice daily for one week ? ? ?Your prescription(s) have been sent to your pharmacy.  ? ? ?Return if symptoms worsen or fail to improve.  ?

## 2021-10-19 NOTE — Progress Notes (Signed)
? ? ?Subjective:  ? ? Patient ID: Kaylee Hansen, female    DOB: 07/16/1955, 66 y.o.   MRN: [id number] ? ?This visit occurred during the SARS-CoV-2 public health emergency.  Safety protocols were in place, including screening questions prior to the visit, additional usage of staff PPE, and extensive cleaning of exam room while observing appropriate contact time as indicated for disinfecting solutions. ? ? ? ?HPI ?Kaylee Hansen is here for  ?Chief Complaint  ?Patient presents with  ? Urinary Tract Infection  ?  Pressure with urination and frequency; burning with urination  ? ? ? ?? UTI:  Her symptoms started  14 days ago.  She states dysuria, urinary frequency, urinary urgency, some abdominal pain.  She also has a bad headache.    She denies hematuria, new back pain and fever.   ? ?The headache has been constant.  The headache reminds her of her previous migraines.  It is mild first thing in the morning and gets worse.  She tried aleve - it did not help much.  Imitrex did not help either.  Some nausea at times and some photophobia.  She denies any falls or head trauma. ? ? ? ?Medications and allergies reviewed with patient and updated if appropriate. ? ?Current Outpatient Medications on File Prior to Visit  ?Medication Sig Dispense Refill  ? albuterol (PROVENTIL HFA;VENTOLIN HFA) 108 (90 Base) MCG/ACT inhaler Inhale 2 puffs into the lungs every 6 (six) hours as needed for wheezing or shortness of breath. 1 Inhaler 5  ? albuterol (PROVENTIL) (2.5 MG/3ML) 0.083% nebulizer solution INHALE 1 VIAL BY NEBULIZATION EVERY 4 HOURS AS NEEDED FOR WHEEZING OR SHORTNESS OF BREATH. 150 mL 5  ? ALPRAZolam (XANAX) 0.25 MG tablet Take 1 tablet (0.25 mg total) by mouth at bedtime as needed for anxiety. 90 tablet 2  ? bisacodyl (DULCOLAX) 5 MG EC tablet Take 2 tablets (10 mg total) by mouth at bedtime. 30 tablet   ? Budeson-Glycopyrrol-Formoterol (BREZTRI AEROSPHERE) 160-9-4.8 MCG/ACT AERO Inhale 2 puffs into the lungs in the morning and at  bedtime. 10.7 g 5  ? calcium carbonate (TUMS - DOSED IN MG ELEMENTAL CALCIUM) 500 MG chewable tablet Chew 1 tablet by mouth daily as needed for indigestion or heartburn.    ? Coenzyme Q10 (COQ10) 100 MG CAPS Take 100 mg by mouth in the morning.    ? diltiazem (CARDIZEM CD) 360 MG 24 hr capsule TAKE 1 CAPSULE BY MOUTH EVERY DAY 90 capsule 0  ? diphenhydrAMINE HCl, Sleep, (ZZZQUIL) 25 MG CAPS Take 50 mg by mouth at bedtime.    ? furosemide (LASIX) 20 MG tablet TAKE 1 TABLET BY MOUTH EVERY DAY 90 tablet 0  ? guaiFENesin (ROBITUSSIN) 100 MG/5ML liquid Take 200 mg by mouth 2 (two) times daily as needed for cough.    ? HYDROcodone-acetaminophen (NORCO) 10-325 MG tablet Take 1 tablet by mouth every 6 (six) hours as needed for severe pain. 120 tablet 0  ? HYDROcodone-acetaminophen (NORCO) 10-325 MG tablet Take 1 tablet by mouth every 6 (six) hours as needed for severe pain. 120 tablet 0  ? hydroxypropyl methylcellulose / hypromellose (ISOPTO TEARS / GONIOVISC) 2.5 % ophthalmic solution Place 1 drop into both eyes 3 (three) times daily as needed for dry eyes.    ? ipratropium-albuterol (DUONEB) 0.5-2.5 (3) MG/3ML SOLN Take 3 mLs by nebulization every 4 (four) hours as needed. 360 mL 0  ? isosorbide mononitrate (IMDUR) 120 MG 24 hr tablet TAKE 1 TABLET (120 MG TOTAL) BY  MOUTH DAILY. 90 tablet 2  ? ketoconazole (NIZORAL) 2 % cream APPLY TO AFFECTED AREA EVERY DAY (Patient taking differently: Apply 1 application. topically daily as needed for irritation. APPLY TO AFFECTED AREA EVERY DAY) 45 g 1  ? levothyroxine (SYNTHROID) 150 MCG tablet TAKE 1 TABLET BY MOUTH EVERY DAY 90 tablet 3  ? naloxegol oxalate (MOVANTIK) 12.5 MG TABS tablet Take 1 tablet (12.5 mg total) by mouth daily. 30 tablet 0  ? naproxen sodium (ALEVE) 220 MG tablet Take 220 mg by mouth daily as needed (For Headache).    ? nitroGLYCERIN (NITROSTAT) 0.4 MG SL tablet DISSOLVE ONE TABLET UNDER THE TONGUE EVERY 5 MINUTES AS NEEDED FOR CHEST PAIN.  DO NOT EXCEED A  TOTAL OF 3 DOSES IN 15 MINUTES 25 tablet 6  ? OXYGEN Inhale 2.5 L/min into the lungs at bedtime.    ? pantoprazole (PROTONIX) 40 MG tablet TAKE 1 TABLET BY MOUTH DAILY BEFORE BREAKFAST 90 tablet 3  ? PARoxetine (PAXIL) 40 MG tablet TAKE 1 TABLET BY MOUTH EVERY DAY 90 tablet 3  ? phenytoin (DILANTIN) 100 MG ER capsule TAKE 100 MG EVERY MORNING AND 200 MG EVERY NIGHT. 270 capsule 3  ? promethazine (PHENERGAN) 25 MG tablet Take 1 tablet (25 mg total) by mouth every 8 (eight) hours as needed for nausea or vomiting. 30 tablet 0  ? Respiratory Therapy Supplies (FLUTTER) DEVI As directed 1 each 0  ? rosuvastatin (CRESTOR) 20 MG tablet TAKE 1 TABLET BY MOUTH EVERY DAY 90 tablet 3  ? SUMAtriptan (IMITREX) 100 MG tablet MAY REPEAT IN 2 HOURS IF HEADACHE PERSISTS OR RECURS. 9 tablet 3  ? topiramate (TOPAMAX) 50 MG tablet Take 1 tablet (50 mg total) by mouth 2 (two) times daily as needed. Take as needed for headache 30 tablet 2  ? traZODone (DESYREL) 100 MG tablet TAKE 1 TABLET BY MOUTH EVERYDAY AT BEDTIME 90 tablet 1  ? Vitamin D, Ergocalciferol, (DRISDOL) 1.25 MG (50000 UNIT) CAPS capsule TAKE 1 CAPSULE (50,000 UNITS TOTAL) BY MOUTH EVERY MONDAY. 12 capsule 3  ? aspirin EC 81 MG tablet Take 1 tablet (81 mg total) by mouth daily. (Patient not taking: Reported on 10/17/2021) 30 tablet 11  ? HYDROcodone-acetaminophen (NORCO) 10-325 MG tablet Take 1 tablet by mouth every 6 (six) hours as needed for up to 5 days for severe pain. 120 tablet 0  ? ?No current facility-administered medications on file prior to visit.  ? ? ?Review of Systems ? ?   ?Objective:  ? ?Vitals:  ? 10/19/21 1523  ?BP: (!) 120/36  ?Pulse: 77  ?Temp: 97.7 ?F (36.5 ?C)  ?SpO2: 93%  ? ?BP Readings from Last 3 Encounters:  ?10/19/21 (!) 120/36  ?10/17/21 (!) 115/30  ?10/14/21 (!) 112/30  ? ?Wt Readings from Last 3 Encounters:  ?10/19/21 149 lb 6.4 oz (67.8 kg)  ?10/14/21 145 lb (65.8 kg)  ?09/12/21 149 lb (67.6 kg)  ? ?Body mass index is 24.86 kg/m?. ? ?  ?Physical  Exam ?Constitutional:   ?   General: She is not in acute distress. ?   Appearance: Normal appearance. She is not ill-appearing.  ?HENT:  ?   Head: Normocephalic.  ?Eyes:  ?   Conjunctiva/sclera: Conjunctivae normal.  ?Abdominal:  ?   General: There is no distension.  ?   Palpations: Abdomen is soft.  ?   Tenderness: There is abdominal tenderness (Mild suprapubic tenderness). There is no right CVA tenderness, left CVA tenderness, guarding or rebound.  ?Skin: ?  General: Skin is warm and dry.  ?Neurological:  ?   General: No focal deficit present.  ?   Mental Status: She is alert and oriented to person, place, and time.  ?   Cranial Nerves: No cranial nerve deficit.  ? ?   ? ? ? ? ? ?Assessment & Plan:  ? ? ?Acute cystitis with hematuria: ?Acute ?Urine dip consistent with UTI ?Will send urine for culture ?Take the antibiotic as prescribed.  Keflex 500 mg bid x 1 week. ?Take tylenol if needed.   ?Increase your water intake.  ?Call if no improvement  ? ? ?Migraines, intractable: ?Acute ?Started 14 days ago ?C/w prior migraines ?Aleve, imitrex not effective ?Depo-medrol 80 mg IM x 1 ?Phenergan 25 mg IM x 1 ?Call if no improvement ? ? ? ? ?

## 2021-10-21 LAB — CULTURE, URINE COMPREHENSIVE

## 2021-10-25 ENCOUNTER — Inpatient Hospital Stay: Payer: Medicare HMO | Attending: Hematology & Oncology

## 2021-10-25 VITALS — BP 122/56 | HR 70 | Temp 98.2°F | Resp 20

## 2021-10-25 DIAGNOSIS — Z79899 Other long term (current) drug therapy: Secondary | ICD-10-CM | POA: Insufficient documentation

## 2021-10-25 DIAGNOSIS — D509 Iron deficiency anemia, unspecified: Secondary | ICD-10-CM | POA: Insufficient documentation

## 2021-10-25 DIAGNOSIS — C3431 Malignant neoplasm of lower lobe, right bronchus or lung: Secondary | ICD-10-CM | POA: Diagnosis not present

## 2021-10-25 DIAGNOSIS — D500 Iron deficiency anemia secondary to blood loss (chronic): Secondary | ICD-10-CM

## 2021-10-25 MED ORDER — SODIUM CHLORIDE 0.9 % IV SOLN
300.0000 mg | Freq: Once | INTRAVENOUS | Status: AC
  Administered 2021-10-25: 300 mg via INTRAVENOUS
  Filled 2021-10-25: qty 300

## 2021-10-25 MED ORDER — SODIUM CHLORIDE 0.9 % IV SOLN: Freq: Once | INTRAVENOUS | Status: AC

## 2021-10-25 NOTE — Chronic Care Management (AMB) (Signed)
?  Chronic Care Management ?Note ? ?10/25/2021 ?Name: ANSHU WEHNER MRN: [id number] DOB: 06-08-1956 ? ?KEILANA MORLOCK is a 66 y.o. year old female who is a primary care patient of Plotnikov, [name], MD and is actively engaged with the care management team. I reached out to [employer] by phone today to assist with re-scheduling a follow up visit with the RN Case Manager ? ?Follow up plan: ?Telephone appointment with care management team member scheduled for:11/04/21 ? ?Laverda Sorenson  ?Care Guide, Embedded Care Coordination ?[facility]  Care Management  ?Direct Dial: [phone] ? ?

## 2021-10-25 NOTE — Progress Notes (Signed)
Location of tumor and Histology per Pathology Report: right lower lobe lung nodule ? ?Biopsy: Per Dr. Marin Olp, the patient has a poor performance status and is not a candidate for biopsy because she is oxygen-depend secondary to COPD. He recommended SBRT. ? ?Past/Anticipated interventions by surgeon, if any:  not a candidate ? ?Past/Anticipated interventions by medical oncology, if any: Chemotherapy - none ? ? ? ?Pain issues, if any:  no  ? ?SAFETY ISSUES: ?Prior radiation?  ?Radiation Treatment Dates: 11/06/2019 through 11/13/2019 ?Site Technique Total Dose (Gy) Dose per Fx (Gy) Completed Fx Beam Energies  ?Lung, Left: Lung_Lt IMRT 54/54 18 3/3 6XFFF  ?  ? ?Pacemaker/ICD? no ?Possible current pregnancy? no ?Is the patient on methotrexate? no ? ?Current Complaints / other details:  "leaky heart valve", shortness of breath at rest and with activity, productive cough, denies hemoptysis ?   ? ?Vitals:  ? 10/26/21 1007  ?BP: (!) 135/30  ?Pulse: 71  ?Resp: 18  ?Temp: (!) 96 ?F (35.6 ?C)  ?TempSrc: Temporal  ?SpO2: 98%  ?Weight: 143 lb 2 oz (64.9 kg)  ?Height: 5\' 5"  (1.651 m)  ? ? ? ?

## 2021-10-25 NOTE — Progress Notes (Signed)
?Radiation Oncology         [phone] ?________________________________ ? ?Outpatient Re-Consultation ? ?Name: Kaylee Hansen MRN: [id number]  ?Date: 10/26/2021  DOB: April 22, 1956 ? ?TD:DUKGURKYH, Kaylee Lacks, MD  [name], MD  ? ?REFERRING PHYSICIAN: [name], MD ? ?DIAGNOSIS: The primary encounter diagnosis was Malignant neoplasm of bronchus and lung ([place]). A diagnosis of Cancer of lingula of lung ([place]) was also pertinent to this visit. ? ?New right lower lobe nodule and right hilar adenopathy, PET positive ? ?Left lingular nodule - likely bronchogenic carcinoma  ? ?Interval since last radiation: 1 year, 11 months, and 13 days  ? ?Radiation Treatment Dates: 11/06/2019 through 11/13/2019 ?Site Technique Total Dose (Gy) Dose per Fx (Gy) Completed Fx Beam Energies  ?Lung, Left: Lung_Lt IMRT 54/54 18 3/3 6XFFF  ? ? ?HISTORY OF PRESENT ILLNESS::Kaylee Hansen is a 66 y.o. female who is accompanied by her husband. she returns today courtesy of Dr. Marin Olp for re-evaluation and an opinion concerning radiosurgery as part of management for her recently diagnosed RLL nodule. I last met with the patient on 01/01/2020 for a routine post-RT follow up to a left lingular nodule. Since then, the patient has maintained follow up visits with Dr. Marin Olp for her COPD and multifactorial anemia.  ? ?During a routine follow-up visit with Dr. Marin Olp on 09/12/21, the patient reported having a CT scan done at [facility] this past December which showed "some growth of a tumor", however this study is not available on EMR. Accordingly, Dr. Marin Olp ordered a PET scan to further evaluate this.   ? ?Subsequent PET scan on 10/13/21 revealed an increase in size of hypermetabolic nodular soft tissue in the superior portion of the right lower lobe, adjacent to right paramediastinal post radiation change, (increased in size when compared with June 16, 2021 prior) and concerning for recurrent disease.  ? ?Accordingly, the  patient followed up with Dr. Marin Olp on 10/14/21 and most recently on 10/19/21 to discuss these concerning findings. Per Dr. Marin Olp, the patient is not a good candidate for systemic therapy or tissue biopsies given her underlying lung disease and ongoing smoking. Given so, Dr. Marin Olp referred the patient to me for consideration of SBRT to the RLL. ? ?In terms of her current respiratory status, the patient continues to have baseline SOB and is on 2.5 L supplemental O2 as needed throughout the day. She has also had a chronic cough for sometime now with occasional green phlegm. ? ? ?PAST MEDICAL HISTORY:  ?Past Medical History:  ?Diagnosis Date  ? Acute respiratory failure following trauma and surgery ([place]) 08/26/2012  ? Anxiety   ? Aortic insufficiency with aortic stenosis 04/2017  ? TTE December 2019: Mild AS with severe regurgitation.  Mild LA dilation.;;  TEE January 2016: Severe-type III aortic regurgitation (holodiastolic flow reversal in the a sending aorta & vena contracta =6.   ? CAD S/P percutaneous coronary angioplasty 11/2017  ? Proximal RCA PCI Synergy DES 3.5 mm x 16 mm (3.8 mm). Ost-mLM 30%. Ost-prox Cx 40%.; f/u Cath 03/2021: widely patent RCA stent. Ost-prox LCx 30%. Normal LAD-D2.  ? Collagenous colitis   ? Colon adenomas 2011  ? Constipation   ? Chronic abdominal pain and constipation  ? COPD (chronic obstructive pulmonary disease) ([place])   ? Depression   ? Diverticulosis   ? Esophageal stricture 11/08/2012  ? Ulcer noted in 2010  ? GERD (gastroesophageal reflux disease)   ? Helicobacter pylori gastritis 2010  ? Pylera Tx  ?  History of cholecystectomy   ? Hx of appendectomy   ? Hx of cancer of lung 1999  ? Hx of hysterectomy   ? Hyperlipidemia   ? Hypothyroidism   ? Iron deficiency anemia due to chronic blood loss 10/01/2019  ? Low back pain   ? Non-STEMI (non-ST elevated myocardial infarction) ([place]) 06/2018  ? RCA PCI  ? Osteoarthritis of knee   ? bilateral knee  ? Seizures ([place])   ? Stroke [facility])    ? CVA, hx of 97  ? 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 ? ?PAST SURGICAL HISTORY: ?Past Surgical History:  ?Procedure Laterality Date  ? ABDOMINAL HYSTERECTOMY    ? complete 1992  ? ABDOMINAL SURGERY    ? Exploratory  ? APPENDECTOMY    ? BALLOON DILATION N/A 11/08/2012  ? Procedure: BALLOON DILATION;  Surgeon: Gatha Mayer, MD;  Location: [facility];  Service: Endoscopy;  Laterality: N/A;  ? CHOLECYSTECTOMY    ? COLONOSCOPY W/ BIOPSIES    ? multiple  ? CORONARY STENT INTERVENTION N/A 06/28/2018  ? Procedure: CORONARY STENT INTERVENTION;  Surgeon: Martinique, Peter M, MD;  Location: [facility];  Service: Cardiovascular;  Laterality: N/A;  90% prox RCA -> PCI with Synergy DES 3.5 mm x 16 mm (3.8 mm).  ? CT CTA CORONARY W/CA SCORE W/CM &/OR WO/CM  05/2017  ? Coronary calcium score 2.9. Intermediate risk. Unusual noncalcified plaque in RCA --FFR negative  ? ESOPHAGOGASTRODUODENOSCOPY    ? w/baloon x 2  ? ESOPHAGOGASTRODUODENOSCOPY N/A 11/08/2012  ? Procedure: ESOPHAGOGASTRODUODENOSCOPY (EGD);  Surgeon: Gatha Mayer, MD;  Location: [facility];  Service: Endoscopy;  Laterality: N/A;  ? ESOPHAGOGASTRODUODENOSCOPY (EGD) WITH PROPOFOL N/A 08/25/2019  ? Procedure: ESOPHAGOGASTRODUODENOSCOPY (EGD) WITH PROPOFOL;  Surgeon: Gatha Mayer, MD;  Location: [facility];  Service: Endoscopy;  Laterality: N/A;  ? LEFT HEART CATH AND CORONARY ANGIOGRAPHY N/A 06/28/2018  ? Procedure: LEFT HEART CATH AND CORONARY ANGIOGRAPHY;  Surgeon: Martinique, Peter M, MD;  Location: [facility];  Service: Cardiovascular:  90% prox RCA -> DES PCI. Ost-mLM 30%. Ost-prox Cx 40%.   EF 55-65%. LVEDP 15 mmHg.   ? MALONEY DILATION  08/25/2019  ? Procedure: MALONEY DILATION;  Surgeon: Gatha Mayer, MD;  Location: [facility];  Service: Endoscopy;;  ? RIGHT HEART CATH N/A 07/11/2018  ? Procedure: RIGHT HEART CATH;  Surgeon: Leonie Man, MD;  Location: [facility] INVASIVE CV LAB;;   Relatively normal Right Heart Cath pressures: PA pressure 26/14  mmHg-mean 20 mmHg; PCWP 13 mmHg.  RAP 6 mmHg, RVP 28/3 mmHg-EDP 10 mmHg. Severely reduced cardiac output and index by Fick: 3.63 and 2.07.  ? RIGHT/LEFT HEART CATH AND CORONARY ANGIOGRAPHY N/A 04/01/2021  ? Procedure: RIGHT/LEFT HEART CATH AND CORONARY ANGIOGRAPHY;  Surgeon: Leonie Man, MD;  Location: [facility];; Very large-dominant RCA with widely patent stent.  LCx ost-prox 30%. Diminutive LAD with very large D2,.RHC: Mildly Reduced CO-CI (4.7-2.64).  LVEDP 20 mmHg.  PAP 27/6 mmHg with PCWP 19 mmHg.  ? TEE WITHOUT CARDIOVERSION N/A 07/11/2018  ? Procedure: TRANSESOPHAGEAL ECHOCARDIOGRAM (TEE);  Surgeon: [name], MD;  Location: [facility] ENDOSCOPY;  Service: Cardiology;;  Severe aortic regurgitation-type III. (suggested by holodiastolic flow reversal and descending aorta-vena contracta 6 mm).  ? TOTAL HIP ARTHROPLASTY    ? Left  ? TRANSTHORACIC ECHOCARDIOGRAM  05/2018  ? EF 60-65%.  No R WMA.  GR 1 DD.  Mild aortic stenosis with severe regurgitation.  Mild MR.[name]  Only mild LV dilation noted.  ? TRANSTHORACIC ECHOCARDIOGRAM  12/2018  ?  EF 60-65%. Mild LVH. Gr1 DD. Mod AoV thickening. Mod-Severe AI, ~ mlid AS.  ? ? ?FAMILY HISTORY:  ?Family History  ?Problem Relation Age of Onset  ? Coronary artery disease Mother   ? Diabetes Mother   ? Hypertension Father   ? Diabetes Son   ? Coronary artery disease Other   ?     grandmother, grandfather  ? Kidney disease Other   ?     aunt  ? Kidney cancer Sister   ? Colon cancer Neg Hx   ?     colon  ? Stomach cancer Neg Hx   ? Esophageal cancer Neg Hx   ? Pancreatic cancer Neg Hx   ? Liver disease Neg Hx   ? ? ?SOCIAL HISTORY:  ?Social History  ? ?Tobacco Use  ? Smoking status: Every Day  ?  Packs/day: 1.00  ?  Years: 48.00  ?  Pack years: 48.00  ?  Types: Cigarettes  ? Smokeless tobacco: Never  ? Tobacco comments:  ?  09/21/20 8cigs/day.  ?Vaping Use  ? Vaping Use: Never used  ?Substance Use Topics  ? Alcohol use: No  ? Drug use: No  ? ? ?ALLERGIES:  ?Allergies   ?Allergen Reactions  ? Nitrofuran Derivatives Other (See Comments)  ?  confusion  ? Oxycodone Hcl Other (See Comments)  ?  Knocked her out for 6 days  ? Morphine Other (See Comments)  ?  "Makes me go crazy

## 2021-10-25 NOTE — Patient Instructions (Signed)

## 2021-10-26 ENCOUNTER — Other Ambulatory Visit: Payer: Self-pay

## 2021-10-26 ENCOUNTER — Ambulatory Visit
Admission: RE | Admit: 2021-10-26 | Discharge: 2021-10-26 | Disposition: A | Discharge status: Home / Self Care | Payer: Medicare HMO | Source: Ambulatory Visit | Attending: Radiation Oncology | Admitting: Radiation Oncology

## 2021-10-26 ENCOUNTER — Encounter: Payer: Self-pay | Admitting: Radiation Oncology

## 2021-10-26 VITALS — BP 135/30 | HR 71 | Temp 96.0°F | Resp 18 | Ht 65.0 in | Wt 143.1 lb

## 2021-10-26 DIAGNOSIS — J90 Pleural effusion, not elsewhere classified: Secondary | ICD-10-CM | POA: Diagnosis not present

## 2021-10-26 DIAGNOSIS — R69 Illness, unspecified: Secondary | ICD-10-CM | POA: Diagnosis not present

## 2021-10-26 DIAGNOSIS — C3490 Malignant neoplasm of unspecified part of unspecified bronchus or lung: Secondary | ICD-10-CM

## 2021-10-26 DIAGNOSIS — I700 Atherosclerosis of aorta: Secondary | ICD-10-CM | POA: Insufficient documentation

## 2021-10-26 DIAGNOSIS — R918 Other nonspecific abnormal finding of lung field: Secondary | ICD-10-CM | POA: Insufficient documentation

## 2021-10-26 DIAGNOSIS — C3410 Malignant neoplasm of upper lobe, unspecified bronchus or lung: Secondary | ICD-10-CM

## 2021-10-26 DIAGNOSIS — C3431 Malignant neoplasm of lower lobe, right bronchus or lung: Secondary | ICD-10-CM | POA: Diagnosis not present

## 2021-10-26 NOTE — Progress Notes (Signed)
See MD note for nursing evaluation. °

## 2021-10-27 ENCOUNTER — Other Ambulatory Visit: Payer: Self-pay | Admitting: Hematology & Oncology

## 2021-10-31 ENCOUNTER — Telehealth: Payer: Self-pay

## 2021-10-31 NOTE — Progress Notes (Signed)
? ? ?Chronic Care Management ?Pharmacy Assistant  ? ?Name: Kaylee Hansen  MRN: [id number] DOB: 1956-05-08 ? ? ?Reason for Encounter: Disease State-Adherence  ?  ? ?Recent office visits:  ?10/19/21 Binnie Rail, MD-Internal Medicine(UTI, [place]) Orders: POCT Urinalysis Dipstick; Medication changes:  keflex 500 mg twice daily for one week ? ?09/07/21 Plotnikov, [name], MD-PCP (COPD) Orders:CMP; Medication changes: Budeson-Glycopyrrol-Formoterol (BREZTRI AEROSPHERE) 160-9-4.8 MCG/ACT AERO ? ?Recent consult visits:  ?10/17/21 [name], MD-Oncology (Malignant neoplasm of bronchus and lung) Orders: CBC,CMP, Ferritin,Iron and iron binding capacity; Medication changes: start Topiramate 50 mg, Stop docusate sodium, Doxycycline Hyclate, fluconazole 150 mg, methylPrednisolone 4 mg and Sulfamethoxazole 800-160 mg ? ?10/14/21 [name], MD-Oncology (Malignant neoplasm of bronchus and lung) Orders: CBC,CMP, Ferritin,Iron and iron binding capacity, Lactate dehydrogenase, Reticulocytes; No medication changes ? ?09/12/21 [name], MD-Oncology (Malignant neoplasm of bronchus and lung) Orders: CBC,CMP, Iron and iron binding capacity; Medication changes: sulfamethoxazole-trimethoprim (BACTRIM DS) 800-160 MG tablet ? ?08/11/21 Margaretha Seeds, MD-Pulmonary disease (COPD) No orders or med changes ? ?Hospital visits:  ?None in previous 6 months ? ?Medications: ?Outpatient Encounter Medications as of 10/31/2021  ?Medication Sig  ? albuterol (PROVENTIL HFA;VENTOLIN HFA) 108 (90 Base) MCG/ACT inhaler Inhale 2 puffs into the lungs every 6 (six) hours as needed for wheezing or shortness of breath.  ? albuterol (PROVENTIL) (2.5 MG/3ML) 0.083% nebulizer solution INHALE 1 VIAL BY NEBULIZATION EVERY 4 HOURS AS NEEDED FOR WHEEZING OR SHORTNESS OF BREATH.  ? ALPRAZolam (XANAX) 0.25 MG tablet Take 1 tablet (0.25 mg total) by mouth at bedtime as needed for anxiety.  ? aspirin EC 81 MG tablet Take 1 tablet (81 mg total) by mouth  daily. (Patient not taking: Reported on 10/17/2021)  ? bisacodyl (DULCOLAX) 5 MG EC tablet Take 2 tablets (10 mg total) by mouth at bedtime.  ? Budeson-Glycopyrrol-Formoterol (BREZTRI AEROSPHERE) 160-9-4.8 MCG/ACT AERO Inhale 2 puffs into the lungs in the morning and at bedtime.  ? calcium carbonate (TUMS - DOSED IN MG ELEMENTAL CALCIUM) 500 MG chewable tablet Chew 1 tablet by mouth daily as needed for indigestion or heartburn.  ? cephALEXin (KEFLEX) 500 MG capsule Take 1 capsule (500 mg total) by mouth 2 (two) times daily.  ? Coenzyme Q10 (COQ10) 100 MG CAPS Take 100 mg by mouth in the morning.  ? diltiazem (CARDIZEM CD) 360 MG 24 hr capsule TAKE 1 CAPSULE BY MOUTH EVERY DAY  ? diphenhydrAMINE HCl, Sleep, (ZZZQUIL) 25 MG CAPS Take 50 mg by mouth at bedtime.  ? furosemide (LASIX) 20 MG tablet TAKE 1 TABLET BY MOUTH EVERY DAY  ? guaiFENesin (ROBITUSSIN) 100 MG/5ML liquid Take 200 mg by mouth 2 (two) times daily as needed for cough.  ? HYDROcodone-acetaminophen (NORCO) 10-325 MG tablet Take 1 tablet by mouth every 6 (six) hours as needed for severe pain.  ? HYDROcodone-acetaminophen (NORCO) 10-325 MG tablet Take 1 tablet by mouth every 6 (six) hours as needed for severe pain.  ? HYDROcodone-acetaminophen (NORCO) 10-325 MG tablet Take 1 tablet by mouth every 6 (six) hours as needed for up to 5 days for severe pain.  ? hydroxypropyl methylcellulose / hypromellose (ISOPTO TEARS / GONIOVISC) 2.5 % ophthalmic solution Place 1 drop into both eyes 3 (three) times daily as needed for dry eyes.  ? ipratropium-albuterol (DUONEB) 0.5-2.5 (3) MG/3ML SOLN Take 3 mLs by nebulization every 4 (four) hours as needed.  ? isosorbide mononitrate (IMDUR) 120 MG 24 hr tablet TAKE 1 TABLET (120 MG TOTAL) BY MOUTH DAILY.  ?  ketoconazole (NIZORAL) 2 % cream APPLY TO AFFECTED AREA EVERY DAY (Patient taking differently: Apply 1 application. topically daily as needed for irritation. APPLY TO AFFECTED AREA EVERY DAY)  ? levothyroxine (SYNTHROID)  150 MCG tablet TAKE 1 TABLET BY MOUTH EVERY DAY  ? naloxegol oxalate (MOVANTIK) 12.5 MG TABS tablet Take 1 tablet (12.5 mg total) by mouth daily.  ? naproxen sodium (ALEVE) 220 MG tablet Take 220 mg by mouth daily as needed (For Headache).  ? nitroGLYCERIN (NITROSTAT) 0.4 MG SL tablet DISSOLVE ONE TABLET UNDER THE TONGUE EVERY 5 MINUTES AS NEEDED FOR CHEST PAIN.  DO NOT EXCEED A TOTAL OF 3 DOSES IN 15 MINUTES  ? OXYGEN Inhale 2.5 L/min into the lungs at bedtime.  ? pantoprazole (PROTONIX) 40 MG tablet TAKE 1 TABLET BY MOUTH DAILY BEFORE BREAKFAST  ? PARoxetine (PAXIL) 40 MG tablet TAKE 1 TABLET BY MOUTH EVERY DAY  ? phenytoin (DILANTIN) 100 MG ER capsule TAKE 100 MG EVERY MORNING AND 200 MG EVERY NIGHT.  ? promethazine (PHENERGAN) 25 MG tablet Take 1 tablet (25 mg total) by mouth every 8 (eight) hours as needed for nausea or vomiting.  ? Respiratory Therapy Supplies (FLUTTER) DEVI As directed (Patient not taking: Reported on 10/26/2021)  ? rosuvastatin (CRESTOR) 20 MG tablet TAKE 1 TABLET BY MOUTH EVERY DAY  ? SUMAtriptan (IMITREX) 100 MG tablet MAY REPEAT IN 2 HOURS IF HEADACHE PERSISTS OR RECURS.  ? topiramate (TOPAMAX) 50 MG tablet Take 1 tablet (50 mg total) by mouth 2 (two) times daily as needed. Take as needed for headache  ? traZODone (DESYREL) 100 MG tablet TAKE 1 TABLET BY MOUTH EVERYDAY AT BEDTIME  ? Vitamin D, Ergocalciferol, (DRISDOL) 1.25 MG (50000 UNIT) CAPS capsule TAKE 1 CAPSULE (50,000 UNITS TOTAL) BY MOUTH EVERY MONDAY.  ? ?Facility-Administered Encounter Medications as of 10/31/2021  ?Medication  ? promethazine (PHENERGAN) injection 25 mg  ? ?[facility] for General Review Call ? ? ?Chart Review: ? ?Have there been any documented new, changed, or discontinued medications since last visit? Yes (If yes, include name, dose, frequency, date) ?Has there been any documented recent hospitalizations or ED visits since last visit with Clinical Pharmacist? No ?Brief Summary (including medication  and/or Diagnosis changes): ? ? ?Adherence Review: ? ?Does the Clinical Pharmacist Assistant have access to adherence rates? Yes ?Adherence rates for STAR metric medications (List medication(s)/day supply/ last 2 fill dates). ?Adherence rates for medications indicated for disease state being reviewed (List medication(s)/day supply/ last 2 fill dates). ?Does the patient have >5 day gap between last estimated fill dates for any of the above medications or other medication gaps? No ?Reason for medication gaps. ? ? ?Disease State Questions: ? ?Able to connect with Patient? Yes ?Did patient have any problems with their health recently? Yes ?Note problems and Concerns:Patient states that she still has headaches,does not think the imitrex is helping ?Have you had any admissions or emergency room visits or worsening of your condition(s) since last visit? No ?Details of ED visit, hospital visit and/or worsening condition(s): ?Have you had any visits with new specialists or providers since your last visit? Yes ?Explain: ?Have you had any new health care problem(s) since your last visit? No ?New problem(s) reported: ?Have you run out of any of your medications since you last spoke with clinical pharmacist? No ?What caused you to run out of your medications? ?Are there any medications you are not taking as prescribed? No ?What kept you from taking your medications as prescribed? ?  Are you having any issues or side effects with your medications? No ?Note of issues or side effects: ?Do you have any other health concerns or questions you want to discuss with your Clinical Pharmacist before your next visit? No ?Note additional concerns and questions from Patient. ?Are there any health concerns that you feel we can do a better job addressing? No ?Note Patient's response. ?Are you having any problems with any of the following since the last visit: (select all that apply) ? Sleeping,  ? Details:Patient states that she does not sleep  well ?12. Any falls since last visit? Yes ? Details:Patient states that she fell getting off the toilet had to have some help getting up  ?13. Any increased or uncontrolled pain since last visit? No ? Deta

## 2021-11-04 ENCOUNTER — Other Ambulatory Visit: Payer: Self-pay | Admitting: Internal Medicine

## 2021-11-04 ENCOUNTER — Encounter: Payer: Self-pay | Admitting: *Deleted

## 2021-11-04 ENCOUNTER — Telehealth: Payer: Medicare HMO

## 2021-11-04 ENCOUNTER — Telehealth: Payer: Self-pay | Admitting: *Deleted

## 2021-11-04 NOTE — Telephone Encounter (Signed)
?  Chronic Care Management  ? ?Follow Up Note ? ? ?11/04/2021 ?Name: Kaylee Hansen MRN: [id number] DOB: 30-Sep-1955 ? ?Referred by: Cassandria Anger, MD ?Reason for referral : Chronic Care Management (CCM RN CM Follow Up Outreach- second consecutive unsuccessful outreach) ? ?A second unsuccessful telephone outreach was attempted today. The patient was referred to the case management team for assistance with care management and care coordination.  ? ?Follow Up Plan:  ?A HIPPA compliant phone message was left for the patient providing contact information and requesting a return call ?Will place request with scheduling care guide to contact patient to re-schedule today's missed CCM RN follow up telephone appointment if I do not hear back from patient by end of day ? ?Oneta Rack, RN, BSN, CCRN Alumnus ?[place] ?[phone]: direct office ? ? ?

## 2021-11-07 DIAGNOSIS — M25562 Pain in left knee: Secondary | ICD-10-CM | POA: Diagnosis not present

## 2021-11-07 DIAGNOSIS — M1712 Unilateral primary osteoarthritis, left knee: Secondary | ICD-10-CM | POA: Diagnosis not present

## 2021-11-07 DIAGNOSIS — M25552 Pain in left hip: Secondary | ICD-10-CM | POA: Diagnosis not present

## 2021-11-09 ENCOUNTER — Ambulatory Visit: Payer: Medicare HMO | Admitting: Pulmonary Disease

## 2021-11-16 DIAGNOSIS — C3490 Malignant neoplasm of unspecified part of unspecified bronchus or lung: Secondary | ICD-10-CM | POA: Diagnosis not present

## 2021-11-18 ENCOUNTER — Encounter: Payer: Self-pay | Admitting: Hematology & Oncology

## 2021-11-18 ENCOUNTER — Inpatient Hospital Stay: Payer: Medicare HMO

## 2021-11-18 ENCOUNTER — Inpatient Hospital Stay: Payer: Medicare HMO | Admitting: Hematology & Oncology

## 2021-11-18 VITALS — BP 144/32 | HR 79 | Temp 98.1°F | Resp 28 | Wt 141.0 lb

## 2021-11-18 DIAGNOSIS — D500 Iron deficiency anemia secondary to blood loss (chronic): Secondary | ICD-10-CM

## 2021-11-18 DIAGNOSIS — Z79899 Other long term (current) drug therapy: Secondary | ICD-10-CM | POA: Diagnosis not present

## 2021-11-18 DIAGNOSIS — C3490 Malignant neoplasm of unspecified part of unspecified bronchus or lung: Secondary | ICD-10-CM

## 2021-11-18 DIAGNOSIS — C3431 Malignant neoplasm of lower lobe, right bronchus or lung: Secondary | ICD-10-CM | POA: Diagnosis not present

## 2021-11-18 DIAGNOSIS — D509 Iron deficiency anemia, unspecified: Secondary | ICD-10-CM | POA: Diagnosis not present

## 2021-11-18 LAB — CBC WITH DIFFERENTIAL (CANCER CENTER ONLY)
Abs Immature Granulocytes: 0.02 10*3/uL (ref 0.00–0.07)
Basophils Absolute: 0.1 10*3/uL (ref 0.0–0.1)
Basophils Relative: 1 %
Eosinophils Absolute: 0.2 10*3/uL (ref 0.0–0.5)
Eosinophils Relative: 2 %
HCT: 36.2 % (ref 36.0–46.0)
Hemoglobin: 11.7 g/dL — ABNORMAL LOW (ref 12.0–15.0)
Immature Granulocytes: 0 %
Lymphocytes Relative: 12 %
Lymphs Abs: 1 10*3/uL (ref 0.7–4.0)
MCH: 31.7 pg (ref 26.0–34.0)
MCHC: 32.3 g/dL (ref 30.0–36.0)
MCV: 98.1 fL (ref 80.0–100.0)
Monocytes Absolute: 0.7 10*3/uL (ref 0.1–1.0)
Monocytes Relative: 7 %
Neutro Abs: 7.1 10*3/uL (ref 1.7–7.7)
Neutrophils Relative %: 78 %
Platelet Count: 254 10*3/uL (ref 150–400)
RBC: 3.69 MIL/uL — ABNORMAL LOW (ref 3.87–5.11)
RDW: 15.4 % (ref 11.5–15.5)
WBC Count: 9 10*3/uL (ref 4.0–10.5)
nRBC: 0 % (ref 0.0–0.2)

## 2021-11-18 LAB — CMP (CANCER CENTER ONLY)
ALT: 9 U/L (ref 0–44)
AST: 14 U/L — ABNORMAL LOW (ref 15–41)
Albumin: 4 g/dL (ref 3.5–5.0)
Alkaline Phosphatase: 157 U/L — ABNORMAL HIGH (ref 38–126)
Anion gap: 6 (ref 5–15)
BUN: 12 mg/dL (ref 8–23)
CO2: 31 mmol/L (ref 22–32)
Calcium: 9.7 mg/dL (ref 8.9–10.3)
Chloride: 104 mmol/L (ref 98–111)
Creatinine: 0.98 mg/dL (ref 0.44–1.00)
GFR, Estimated: >60 mL/min (ref >60)
Glucose, Bld: 94 mg/dL (ref 70–99)
Potassium: 4 mmol/L (ref 3.5–5.1)
Sodium: 141 mmol/L (ref 135–145)
Total Bilirubin: 0.2 mg/dL — ABNORMAL LOW (ref 0.3–1.2)
Total Protein: 7.2 g/dL (ref 6.5–8.1)

## 2021-11-18 LAB — RETICULOCYTES
Immature Retic Fract: 14.4 % (ref 2.3–15.9)
RBC.: 3.69 MIL/uL — ABNORMAL LOW (ref 3.87–5.11)
Retic Count, Absolute: 66.1 10*3/uL (ref 19.0–186.0)
Retic Ct Pct: 1.8 % (ref 0.4–3.1)

## 2021-11-18 LAB — FERRITIN: Ferritin: 79 ng/mL (ref 11–307)

## 2021-11-18 LAB — LACTATE DEHYDROGENASE: LDH: 139 U/L (ref 98–192)

## 2021-11-18 NOTE — Progress Notes (Signed)
Hematology and Oncology Follow Up Visit  Kaylee Hansen [id number] 19-Sep-1955 66 y.o. 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  Unfortunately, she is not a candidate for stereotactic radiosurgery.  Looks like she is a candidate for external beam radiation but with a shortened course.  She has not decided whether she would like this or not.  We did give her iron.  When we last saw her, her iron studies showed a ferritin of 56 with an iron saturation of 17%.  She still has the breathing issues.  She is still smoking.  She has little bit of weakness in the left leg which is chronic.  There has been no problems with obvious bleeding.  There is no change in bowel or bladder habits.  Overall, I would have to say that her performance status for now is ECOG 2, at best.     Medications:  Allergies as of 11/18/2021       Reactions   Nitrofuran Derivatives Other (See Comments)   confusion   Oxycodone Hcl Other (See Comments)   Knocked her out for 6 days   Morphine Other (See Comments)   "Makes me go crazy."    Penicillins Hives   Has patient had a PCN reaction causing immediate rash, facial/tongue/throat swelling, SOB or lightheadedness with hypotension: unknown Has patient had a PCN reaction causing severe rash involving mucus membranes or skin necrosis: unknown Has patient had a PCN reaction that required hospitalization No Has patient had a PCN reaction occurring within the last 10 years: No If all of the above answers are "NO", then may proceed with Cephalosporin use. Other reaction(s): Rash-Generalized   Tape Other (See Comments)   Skin turns red and burns        Medication List         Accurate as of Nov 18, 2021  3:43 PM. If you have any questions, ask your nurse or doctor.          STOP taking these medications    cephALEXin 500 MG capsule Commonly known as: KEFLEX Stopped by: [name], MD       TAKE these medications    albuterol 108 (90 Base) MCG/ACT inhaler Commonly known as: VENTOLIN HFA Inhale 2 puffs into the lungs every 6 (six) hours as needed for wheezing or shortness of breath.   albuterol (2.5 MG/3ML) 0.083% nebulizer solution Commonly known as: PROVENTIL INHALE 1 VIAL BY NEBULIZATION EVERY 4 HOURS AS NEEDED FOR WHEEZING OR SHORTNESS OF BREATH.   ALPRAZolam 0.25 MG tablet Commonly known as: XANAX Take 1 tablet (0.25 mg total) by mouth at bedtime as needed for anxiety.   aspirin EC 81 MG tablet Take 1 tablet (81 mg total) by mouth daily.   bisacodyl 5 MG EC tablet Commonly known as: Dulcolax Take 2 tablets (10 mg total) by mouth at bedtime.   Breztri Aerosphere 160-9-4.8 MCG/ACT Aero Generic drug: Budeson-Glycopyrrol-Formoterol Inhale 2 puffs into the lungs in the morning and at bedtime.   calcium carbonate 500 MG chewable tablet Commonly known as: TUMS - dosed in mg elemental calcium Chew 1 tablet by mouth daily as needed for indigestion or heartburn.  CoQ10 100 MG Caps Take 100 mg by mouth in the morning.   diltiazem 360 MG 24 hr capsule Commonly known as: CARDIZEM CD TAKE 1 CAPSULE BY MOUTH EVERY DAY   [name] As directed   furosemide 20 MG tablet Commonly known as: LASIX TAKE 1 TABLET BY MOUTH EVERY DAY   guaiFENesin 100 MG/5ML liquid Commonly known as: ROBITUSSIN Take 200 mg by mouth 2 (two) times daily as needed for cough.   HYDROcodone-acetaminophen 10-325 MG tablet Commonly known as: NORCO Take 1 tablet by mouth every 6 (six) hours as needed for severe pain.   HYDROcodone-acetaminophen 10-325 MG tablet Commonly known as: NORCO Take 1 tablet by mouth every 6 (six) hours as needed for up to 5 days for  severe pain.   hydroxypropyl methylcellulose / hypromellose 2.5 % ophthalmic solution Commonly known as: ISOPTO TEARS / GONIOVISC Place 1 drop into both eyes 3 (three) times daily as needed for dry eyes.   ipratropium-albuterol 0.5-2.5 (3) MG/3ML Soln Commonly known as: DUONEB Take 3 mLs by nebulization every 4 (four) hours as needed.   isosorbide mononitrate 120 MG 24 hr tablet Commonly known as: IMDUR TAKE 1 TABLET (120 MG TOTAL) BY MOUTH DAILY.   ketoconazole 2 % cream Commonly known as: NIZORAL APPLY TO AFFECTED AREA EVERY DAY What changed:  how much to take how to take this when to take this reasons to take this   levothyroxine 150 MCG tablet Commonly known as: SYNTHROID TAKE 1 TABLET BY MOUTH EVERY DAY   naloxegol oxalate 12.5 MG Tabs tablet Commonly known as: MOVANTIK Take 1 tablet (12.5 mg total) by mouth daily.   naproxen sodium 220 MG tablet Commonly known as: ALEVE Take 220 mg by mouth daily as needed (For Headache).   nitroGLYCERIN 0.4 MG SL tablet Commonly known as: NITROSTAT DISSOLVE ONE TABLET UNDER THE TONGUE EVERY 5 MINUTES AS NEEDED FOR CHEST PAIN.  DO NOT EXCEED A TOTAL OF 3 DOSES IN 15 MINUTES   OXYGEN Inhale 2.5 L/min into the lungs at bedtime.   pantoprazole 40 MG tablet Commonly known as: PROTONIX TAKE 1 TABLET BY MOUTH DAILY BEFORE BREAKFAST   PARoxetine 40 MG tablet Commonly known as: PAXIL TAKE 1 TABLET BY MOUTH EVERY DAY   phenytoin 100 MG ER capsule Commonly known as: DILANTIN TAKE 100 MG EVERY MORNING AND 200 MG EVERY NIGHT.   promethazine 25 MG tablet Commonly known as: PHENERGAN Take 1 tablet (25 mg total) by mouth every 8 (eight) hours as needed for nausea or vomiting.   rosuvastatin 20 MG tablet Commonly known as: CRESTOR TAKE 1 TABLET BY MOUTH EVERY DAY   SUMAtriptan 100 MG tablet Commonly known as: IMITREX MAY REPEAT IN 2 HOURS IF HEADACHE PERSISTS OR RECURS.   topiramate 50 MG tablet Commonly known as:  Topamax Take 1 tablet (50 mg total) by mouth 2 (two) times daily as needed. Take as needed for headache   traZODone 100 MG tablet Commonly known as: DESYREL TAKE 1 TABLET BY MOUTH EVERYDAY AT BEDTIME   Vitamin D (Ergocalciferol) 1.25 MG (50000 UNIT) Caps capsule Commonly known as: DRISDOL TAKE 1 CAPSULE (50,000 UNITS TOTAL) BY MOUTH EVERY MONDAY.   ZzzQuil 25 MG Caps Generic drug: diphenhydrAMINE HCl (Sleep) Take 50 mg by mouth at bedtime.        Allergies:  Allergies  Allergen Reactions   Nitrofuran Derivatives Other (See Comments)    confusion   Oxycodone Hcl Other (See Comments)    Knocked her out for  6 days   Morphine Other (See Comments)    "Makes me go crazy."    Penicillins Hives    Has patient had a PCN reaction causing immediate rash, facial/tongue/throat swelling, SOB or lightheadedness with hypotension: unknown Has patient had a PCN reaction causing severe rash involving mucus membranes or skin necrosis: unknown Has patient had a PCN reaction that required hospitalization No Has patient had a PCN reaction occurring within the last 10 years: No If all of the above answers are "NO", then may proceed with Cephalosporin use.  Other reaction(s): Rash-Generalized   Tape Other (See Comments)    Skin turns red and burns    Past Medical History, Surgical history, Social history, and Family History were reviewed and updated.  Review of Systems: Review of Systems  Constitutional: Negative.   HENT: Negative.    Eyes: Negative.   Respiratory:  Positive for cough, sputum production, shortness of breath and wheezing.   Cardiovascular:  Positive for palpitations and leg swelling.  Gastrointestinal:  Positive for diarrhea and nausea.  Genitourinary:  Positive for frequency and urgency.  Musculoskeletal:  Positive for joint pain and myalgias.  Skin: Negative.   Neurological:  Positive for weakness.  Endo/Heme/Allergies: Negative.   Psychiatric/Behavioral: Negative.       Physical Exam:  weight is 141 lb (64 kg). Her oral temperature is 98.1 F (36.7 C). Her blood pressure is 144/32 (abnormal) and her pulse is 79. Her respiration is 28 (abnormal) and oxygen saturation is 98%.   Wt Readings from Last 3 Encounters:  11/18/21 141 lb (64 kg)  10/26/21 143 lb 2 oz (64.9 kg)  10/19/21 149 lb 6.4 oz (67.8 kg)    Physical Exam Vitals reviewed.  HENT:     Head: Normocephalic and atraumatic.  Eyes:     Pupils: Pupils are equal, round, and reactive to light.  Cardiovascular:     Rate and Rhythm: Normal rate and regular rhythm.     Heart sounds: Normal heart sounds.  Pulmonary:     Effort: Pulmonary effort is normal.     Breath sounds: Normal breath sounds.     Comments: Pulmonary exam shows wheezes or rhonchi bilaterally.  She has relatively decent air movement. Abdominal:     General: Bowel sounds are normal.     Palpations: Abdomen is soft.  Musculoskeletal:        General: No tenderness or deformity. Normal range of motion.     Cervical back: Normal range of motion.     Comments: Extremities does show some mild edema in the legs bilaterally.  Lymphadenopathy:     Cervical: No cervical adenopathy.  Skin:    General: Skin is warm and dry.     Findings: No erythema or rash.  Neurological:     Mental Status: She is alert and oriented to person, place, and time.  Psychiatric:        Behavior: Behavior normal.        Thought Content: Thought content normal.        Judgment: Judgment normal.     Lab Results  Component Value Date   WBC 9.0 11/18/2021   HGB 11.7 (L) 11/18/2021   HCT 36.2 11/18/2021   MCV 98.1 11/18/2021   PLT 254 11/18/2021   Lab Results  Component Value Date   FERRITIN 56 10/14/2021   IRON 40 10/14/2021   TIBC 230 (L) 10/14/2021   UIBC 190 10/14/2021   IRONPCTSAT 17 10/14/2021   Lab Results  Component  Value Date   RETICCTPCT 1.8 11/18/2021   RBC 3.69 (L) 11/18/2021   RBC 3.69 (L) 11/18/2021   No results found  for: KPAFRELGTCHN, LAMBDASER, KAPLAMBRATIO No results found for: IGGSERUM, IGA, IGMSERUM No results found for: Odetta Pink, SPEI   Chemistry      Component Value Date/Time   NA 142 10/14/2021 1512   NA 144 07/08/2018 1059   K 4.4 10/14/2021 1512   CL 103 10/14/2021 1512   CO2 33 (H) 10/14/2021 1512   BUN 12 10/14/2021 1512   BUN 12 07/08/2018 1059   CREATININE 0.96 10/14/2021 1512      Component Value Date/Time   CALCIUM 9.2 10/14/2021 1512   ALKPHOS 125 10/14/2021 1512   AST 10 (L) 10/14/2021 1512   ALT 6 10/14/2021 1512   BILITOT 0.2 (L) 10/14/2021 1512       Impression and Plan: Ms. Berroa is a very pleasant 66 yo caucasian female with clinical bronchogenic carcinoma and iron deficiency anemia.   She completed SBRT in May 2021.   She now has a new problem.  Again, this is in the right lower lobe.  She has seen Radiation Oncology.  They recommended radiation therapy.  She is still thinking about this.  We will see what her iron studies look like.  I would have to believe that they are doing okay given the fact that her hemoglobin is better.  We will let her decide whether or not she would like to have radiation.  Hopefully she will.  I think she will do okay with radiation.  I will plan to see her back in another 6 weeks or so.   [name], MD 5/26/20233:43 PM

## 2021-11-22 LAB — IRON AND IRON BINDING CAPACITY (CC-WL,HP ONLY)
Iron: 80 ug/dL (ref 28–170)
Saturation Ratios: 34 % — ABNORMAL HIGH (ref 10.4–31.8)
TIBC: 238 ug/dL — ABNORMAL LOW (ref 250–450)
UIBC: 158 ug/dL (ref 148–442)

## 2021-11-23 ENCOUNTER — Ambulatory Visit (INDEPENDENT_AMBULATORY_CARE_PROVIDER_SITE_OTHER): Payer: Medicare HMO | Admitting: *Deleted

## 2021-11-23 DIAGNOSIS — J449 Chronic obstructive pulmonary disease, unspecified: Secondary | ICD-10-CM | POA: Diagnosis not present

## 2021-11-23 DIAGNOSIS — C3490 Malignant neoplasm of unspecified part of unspecified bronchus or lung: Secondary | ICD-10-CM | POA: Diagnosis not present

## 2021-11-24 NOTE — Patient Instructions (Signed)
Visit Information  L[place]ll, thank you for taking time to talk with me today. Please don't hesitate to contact me if I can be of assistance to you before our next scheduled telephone appointment  Below are the goals we discussed today:  Patient Self-Care Activities: Patient Kaylee Hansen will: Take medications as prescribed Attend all scheduled provider appointments Call pharmacy for medication refills Call provider office for new concerns or questions Continue efforts to follow heart healthy, low salt, low cholesterol diet Develop and follow a rescue plan if symptoms flare up: please contact your care provider promptly if you believe your breathing is getting worse Not smoking is the single most important thing you can do to help with your breathing Continue using your oxygen as prescribed Continue your efforts to prevent falls at home: use your walker and your electric wheelchair: falls can cause serious injuries for you and for your husband who assists you in getting up after a fall  Our next scheduled telephone follow up visit/ appointment is scheduled on:  Thursday, December 29, 2021 at 3:00 pm- This is a PHONE [place] appointment  If you need to cancel or re-schedule our visit, please call [phone] and our care guide team will be happy to assist you.   I look forward to hearing about your progress.   Oneta Rack, RN, BSN, [place] [phone]: direct office  If you are experiencing a Mental Health or [place] or need someone to talk to, please  call the Suicide and Crisis Lifeline: 988 call the [place] National Suicide Prevention Lifeline: 6692594332 or TTY: (405) 011-1882 TTY 507-011-9939) to talk to a trained counselor call 1-800-273-TALK (toll free, 24 hour hotline) go to [facility] [address], [place] [phone]) call the [place]:  [phone] call 911   The patient verbalized understanding of instructions, educational materials, and care plan provided today and agreed to receive a mailed copy of patient instructions, educational materials, and care plan  Radiation Therapy Information, Adult Radiation therapy, also called radiotherapy, is a type of cancer treatment that uses high-energy waves of radiation to kill cancer cells or shrink tumors. The energy in radiation makes cancer cells stop multiplying. Radiation therapy affects only the cells in the area of the body that gets treatment. Cancer cells do not start to die until after days or weeks of treatment, and they continue dying for weeks or months after radiation therapy ends. In radiation therapy, the radiation may be delivered from outside your body (external beam radiation therapy) or from a source of radiation that is put inside your body (internal or systemic radiation therapy). How often and how long you will need radiation therapy depends on your condition. What are the risks? Generally, this is a safe treatment. Some people do not have side effects from the therapy. However, most people will have side effects. Side effects depend on the amount of radiation and the part of the body that was exposed to radiation. The most common side effects include: Tiredness (fatigue). Red, irritated skin where the radiation was given. Nausea or vomiting. Diarrhea. Hair loss. Mouth pain or sores. Other side effects may include: Swelling in the throat and trouble swallowing. Urinary and bladder changes. Sexual changes. Some effects may appear many years later, such as: Inability to have a baby (infertility). Developing another type of cancer. Questions to ask your health care provider: What kind of radiation therapy do I  need? What are the goals of the treatment? How long will treatment take? How can I prevent or manage side effects? Will my fertility be affected? Are  there long-term side effects of this treatment? What happens before treatment? If you are going to have external beam radiation therapy, you will go through a planning session (simulation) before treatment begins. During the simulation session: Your health care provider will plan exactly where the radiation will be delivered (treatment field). You will be positioned for your therapy. The goal is to have a position that can be reproduced for each therapy session. Temporary marks may be drawn on your body. Permanent marks may also be drawn on your body in order for you to be positioned the same way for each therapy session. A tool that holds a body part in place (immobilization device) may be used to keep the area of treatment in the correct position. You can also take these steps to prepare for treatment: Have your health care provider explain anything that you do not understand. Discuss any concerns or fears you may have. What happens during treatment? The treatment will vary depending on the type of radiation therapy you are having. Types include: External beam radiation therapy. A beam of radiation comes from a machine and enters your body, targeting the specific area of cancer cells. Internal radiation therapy, or brachytherapy. A wire, seed, or other material that contains radiation is placed inside your body in the area of the cancer cells. Systemic radiation therapy. You are given a radioactive medicine that travels through your blood to kill the cancer cells. You may take the medicine by mouth or may receive it through an IV inserted into one of your veins. Follow these instructions at home: Radiation therapy affects everyone differently. If you do have side effects, you can take steps to manage them. Skin care  Clean your skin daily with warm water and a mild soap that is recommended by your health care provider. Do not scrub or rub your skin. Use only warm water in the shower or bath.  Avoid very hot water. Gently pat your skin dry after bathing or showering. If your skin becomes dry, ask your health care provider about the use of gentle lotions or creams. Avoid scratching the treated area. Do not wash off the markings from the simulation until instructed to do so. Avoid the sun. If you are in the sun, try to cover treated areas and apply a sunscreen that has been approved by your health care provider. Eating and drinking     Take these steps to help with nausea, vomiting, diarrhea, or soreness in the mouth or throat: Eat small nutritious meals and snacks regularly during the day. Choose bland and soft foods that are easy to eat. Avoid spicy or sugary snacks. If you have sores in your mouth, also avoid very hot or very cold food and drinks. Drink plenty of clear fluids, especially if you have diarrhea. Take over-the-counter or prescription medicines for your symptoms as told by your health care provider.  Urinary and bladder changes These steps can help if you have urinary and bladder symptoms such as urinary frequency, burning with urination, inability to control when you urinate (incontinence), blood in your urine, bladder spasms, or trouble emptying your bladder: Drink plenty of fluids. Avoid caffeinated beverages like tea or coffee. Follow other instructions from your health care provider, which may include: Doing physical therapy if you have incontinence. Taking antibiotics for urinary tract infections or  other medicines for symptoms like bladder spasms. Sexual or fertility issues Radiation therapy can sometimes cause sexual changes, like hormone changes and decreased sex drive. It can also cause short-term or long-term infertility in males or females. Symptoms in the female reproductive tract may also include pain with sex, vaginal dryness or itching, and symptoms of menopause, such as hot flashes. Symptoms in the female reproductive tract may also include the  inability to have an erection (impotence). Taking the following steps can help manage these issues: Before you start radiation therapy, let your health care team know if you think you might want to have children in the future. They can discuss preserving eggs or sperm. Ask your health care provider if it is okay to have sex while on radiation therapy. During radiation therapy, be sure to practice safe sex and use birth control or condoms. Use lubricants during sexual activity and ask your health care provider about other options, such as vaginal stretching if narrowing of the vagina develops. You may be prescribed medicine or other treatments if you experience sexual dysfunction. Throat care Radiation therapy to the neck or chest can cause the lining of your throat to swell and become sore (esophagitis). You may feel like you have a lump in your throat or burning in your chest or throat. You may also have trouble swallowing. You can take these steps to help with these symptoms: Be aware of the textures of food you eat when your throat is sore. Choose foods that are easy to swallow. You can cut, blend, or shred foods or eat soft foods. Eat small meals and snacks that are nutritious. Talking to a dietitian may help you select healthy options to maintain your weight. Sit upright when eating. General instructions Take over-the-counter and prescription medicines only as told by your health care provider. Do not use any products that contain nicotine or tobacco. These products include cigarettes, chewing tobacco, and vaping devices, such as e-cigarettes. If you need help quitting, ask your health care provider. Wear soft, comfortable clothing to reduce skin discomfort. If you feel tired, take a short nap (30 minutes or less) during the day. Return to your normal activities as told by your health care provider. Ask your health care provider what activities are safe for you. Keep all follow-up visits. This  is important. The visits are usually scheduled 6 weeks to 6 months after radiation therapy. Summary Radiation therapy is a type of cancer treatment that uses high-energy waves of radiation to kill cancer cells or shrink tumors. In radiation therapy, the radiation may be delivered from outside your body or from a source of radiation that is put inside your body. Most people will experience side effects from the therapy. Side effects depend on the amount of radiation and the part of the body that was exposed to radiation. Keep all follow-up visits. This is important. This information is not intended to replace advice given to you by your health care provider. Make sure you discuss any questions you have with your health care provider. Document Revised: 04/12/2021 Document Reviewed: 04/12/2021 Elsevier Patient Education  [place].
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
## 2021-11-30 DIAGNOSIS — M7062 Trochanteric bursitis, left hip: Secondary | ICD-10-CM | POA: Diagnosis not present

## 2021-11-30 DIAGNOSIS — M1712 Unilateral primary osteoarthritis, left knee: Secondary | ICD-10-CM | POA: Diagnosis not present

## 2021-12-06 ENCOUNTER — Other Ambulatory Visit: Payer: Self-pay | Admitting: Cardiology

## 2021-12-08 ENCOUNTER — Encounter: Payer: Self-pay | Admitting: Internal Medicine

## 2021-12-08 ENCOUNTER — Ambulatory Visit (INDEPENDENT_AMBULATORY_CARE_PROVIDER_SITE_OTHER): Payer: Medicare HMO | Admitting: Internal Medicine

## 2021-12-08 DIAGNOSIS — C3490 Malignant neoplasm of unspecified part of unspecified bronchus or lung: Secondary | ICD-10-CM

## 2021-12-08 DIAGNOSIS — B078 Other viral warts: Secondary | ICD-10-CM | POA: Diagnosis not present

## 2021-12-08 DIAGNOSIS — F411 Generalized anxiety disorder: Secondary | ICD-10-CM | POA: Diagnosis not present

## 2021-12-08 DIAGNOSIS — M5440 Lumbago with sciatica, unspecified side: Secondary | ICD-10-CM

## 2021-12-08 DIAGNOSIS — G8929 Other chronic pain: Secondary | ICD-10-CM

## 2021-12-08 DIAGNOSIS — R69 Illness, unspecified: Secondary | ICD-10-CM | POA: Diagnosis not present

## 2021-12-08 MED ORDER — HYDROCODONE-ACETAMINOPHEN 10-325 MG PO TABS: 1.0000 | ORAL_TABLET | Freq: Four times a day (QID) | ORAL | 0 refills | Status: DC | PRN

## 2021-12-08 MED ORDER — ALPRAZOLAM 0.25 MG PO TABS
0.2500 mg | ORAL_TABLET | Freq: Every evening | ORAL | 2 refills | Status: DC | PRN
Start: 2021-12-08 — End: 2023-05-07

## 2021-12-08 NOTE — Assessment & Plan Note (Signed)
New - see cryo

## 2021-12-08 NOTE — Progress Notes (Signed)
Subjective:  Patient ID: Kaylee Hansen, female    DOB: 03-Sep-1955  Age: 66 y.o. MRN: [id number]  CC: Follow-up and Headache   HPI Kaylee Hansen presents for HAs, COPD, LBP and anxiety C/o warts  Pt saw Dr Marin Olp and Dr Sondra Come recently. XRT is to be scheduled.  Outpatient Medications Prior to Visit  Medication Sig Dispense Refill   albuterol (PROVENTIL HFA;VENTOLIN HFA) 108 (90 Base) MCG/ACT inhaler Inhale 2 puffs into the lungs every 6 (six) hours as needed for wheezing or shortness of breath. 1 Inhaler 5   albuterol (PROVENTIL) (2.5 MG/3ML) 0.083% nebulizer solution INHALE 1 VIAL BY NEBULIZATION EVERY 4 HOURS AS NEEDED FOR WHEEZING OR SHORTNESS OF BREATH. 150 mL 5   bisacodyl (DULCOLAX) 5 MG EC tablet Take 2 tablets (10 mg total) by mouth at bedtime. 30 tablet    Budeson-Glycopyrrol-Formoterol (BREZTRI AEROSPHERE) 160-9-4.8 MCG/ACT AERO Inhale 2 puffs into the lungs in the morning and at bedtime. 10.7 g 5   calcium carbonate (TUMS - DOSED IN MG ELEMENTAL CALCIUM) 500 MG chewable tablet Chew 1 tablet by mouth daily as needed for indigestion or heartburn.     Coenzyme Q10 (COQ10) 100 MG CAPS Take 100 mg by mouth in the morning.     diltiazem (CARDIZEM CD) 360 MG 24 hr capsule TAKE 1 CAPSULE BY MOUTH EVERY DAY 90 capsule 2   diphenhydrAMINE HCl, Sleep, (ZZZQUIL) 25 MG CAPS Take 50 mg by mouth at bedtime.     furosemide (LASIX) 20 MG tablet TAKE 1 TABLET BY MOUTH EVERY DAY 90 tablet 2   guaiFENesin (ROBITUSSIN) 100 MG/5ML liquid Take 200 mg by mouth 2 (two) times daily as needed for cough.     hydroxypropyl methylcellulose / hypromellose (ISOPTO TEARS / GONIOVISC) 2.5 % ophthalmic solution Place 1 drop into both eyes 3 (three) times daily as needed for dry eyes.     ipratropium-albuterol (DUONEB) 0.5-2.5 (3) MG/3ML SOLN Take 3 mLs by nebulization every 4 (four) hours as needed. 360 mL 0   ketoconazole (NIZORAL) 2 % cream APPLY TO AFFECTED AREA EVERY DAY (Patient taking differently: Apply  1 application  topically daily as needed for irritation. APPLY TO AFFECTED AREA EVERY DAY) 45 g 1   levothyroxine (SYNTHROID) 150 MCG tablet TAKE 1 TABLET BY MOUTH EVERY DAY 90 tablet 3   naloxegol oxalate (MOVANTIK) 12.5 MG TABS tablet Take 1 tablet (12.5 mg total) by mouth daily. 30 tablet 0   naproxen sodium (ALEVE) 220 MG tablet Take 220 mg by mouth daily as needed (For Headache).     nitroGLYCERIN (NITROSTAT) 0.4 MG SL tablet DISSOLVE ONE TABLET UNDER THE TONGUE EVERY 5 MINUTES AS NEEDED FOR CHEST PAIN.  DO NOT EXCEED A TOTAL OF 3 DOSES IN 15 MINUTES 25 tablet 6   OXYGEN Inhale 2.5 L/min into the lungs at bedtime.     pantoprazole (PROTONIX) 40 MG tablet TAKE 1 TABLET BY MOUTH DAILY BEFORE BREAKFAST 90 tablet 3   PARoxetine (PAXIL) 40 MG tablet TAKE 1 TABLET BY MOUTH EVERY DAY 90 tablet 3   phenytoin (DILANTIN) 100 MG ER capsule TAKE 100 MG EVERY MORNING AND 200 MG EVERY NIGHT. 270 capsule 3   rosuvastatin (CRESTOR) 20 MG tablet TAKE 1 TABLET BY MOUTH EVERY DAY 90 tablet 3   SUMAtriptan (IMITREX) 100 MG tablet MAY REPEAT IN 2 HOURS IF HEADACHE PERSISTS OR RECURS. 9 tablet 3   topiramate (TOPAMAX) 50 MG tablet Take 1 tablet (50 mg total) by mouth 2 (two)  times daily as needed. Take as needed for headache 30 tablet 2   traZODone (DESYREL) 100 MG tablet TAKE 1 TABLET BY MOUTH EVERYDAY AT BEDTIME 90 tablet 1   Vitamin D, Ergocalciferol, (DRISDOL) 1.25 MG (50000 UNIT) CAPS capsule TAKE 1 CAPSULE (50,000 UNITS TOTAL) BY MOUTH EVERY MONDAY. 12 capsule 3   ALPRAZolam (XANAX) 0.25 MG tablet Take 1 tablet (0.25 mg total) by mouth at bedtime as needed for anxiety. 90 tablet 2   HYDROcodone-acetaminophen (NORCO) 10-325 MG tablet Take 1 tablet by mouth every 6 (six) hours as needed for severe pain. 120 tablet 0   isosorbide mononitrate (IMDUR) 120 MG 24 hr tablet TAKE 1 TABLET (120 MG TOTAL) BY MOUTH DAILY. 90 tablet 2   aspirin EC 81 MG tablet Take 1 tablet (81 mg total) by mouth daily. (Patient not  taking: Reported on 11/18/2021) 30 tablet 11   promethazine (PHENERGAN) 25 MG tablet Take 1 tablet (25 mg total) by mouth every 8 (eight) hours as needed for nausea or vomiting. 30 tablet 0   Respiratory Therapy Supplies (FLUTTER) DEVI As directed (Patient not taking: Reported on 12/28/2021) 1 each 0   HYDROcodone-acetaminophen (NORCO) 10-325 MG tablet Take 1 tablet by mouth every 6 (six) hours as needed for up to 5 days for severe pain. 120 tablet 0   Facility-Administered Medications Prior to Visit  Medication Dose Route Frequency Provider Last Rate Last Admin   promethazine (PHENERGAN) injection 25 mg  25 mg Intramuscular Q6H PRN Binnie Rail, MD   25 mg at 10/19/21 1625    ROS: Review of Systems  Constitutional:  Positive for fatigue and unexpected weight change. Negative for activity change, appetite change and chills.  HENT:  Negative for congestion, mouth sores and sinus pressure.   Eyes:  Negative for visual disturbance.  Respiratory:  Negative for cough and chest tightness.   Cardiovascular:  Negative for leg swelling.  Gastrointestinal:  Negative for abdominal pain and nausea.  Genitourinary:  Negative for difficulty urinating, frequency and vaginal pain.  Musculoskeletal:  Positive for arthralgias, back pain, gait problem, myalgias, neck pain and neck stiffness.  Skin:  Positive for color change. Negative for pallor, rash and wound.  Neurological:  Positive for weakness. Negative for dizziness, tremors, numbness and headaches.  Psychiatric/Behavioral:  Positive for decreased concentration, dysphoric mood and sleep disturbance. Negative for behavioral problems, confusion and suicidal ideas. The patient is nervous/anxious.     Objective:  BP (!) 120/32   Pulse 83   Temp 98.7 F (37.1 C) (Oral)   Ht 5\' 5"  (1.651 m)   Wt 140 lb 6 oz (63.7 kg)   SpO2 94%   BMI 23.36 kg/m   BP Readings from Last 3 Encounters:  12/28/21 (!) 141/30  12/22/21 (!) 134/33  12/08/21 (!) 120/32     Wt Readings from Last 3 Encounters:  12/28/21 139 lb 12.8 oz (63.4 kg)  12/22/21 139 lb 6.4 oz (63.2 kg)  12/08/21 140 lb 6 oz (63.7 kg)    Physical Exam Constitutional:      General: She is not in acute distress.    Appearance: She is well-developed.  HENT:     Head: Normocephalic.     Right Ear: External ear normal.     Left Ear: External ear normal.     Nose: Nose normal.  Eyes:     General:        Right eye: No discharge.        Left eye: No  discharge.     Conjunctiva/sclera: Conjunctivae normal.     Pupils: Pupils are equal, round, and reactive to light.  Neck:     Thyroid: No thyromegaly.     Vascular: No JVD.     Trachea: No tracheal deviation.  Cardiovascular:     Rate and Rhythm: Normal rate and regular rhythm.     Heart sounds: Normal heart sounds.  Pulmonary:     Effort: No respiratory distress.     Breath sounds: No stridor. No wheezing.  Abdominal:     General: Bowel sounds are normal. There is no distension.     Palpations: Abdomen is soft. There is no mass.     Tenderness: There is no abdominal tenderness. There is no guarding or rebound.  Musculoskeletal:        General: No tenderness.     Cervical back: Normal range of motion and neck supple. No rigidity.  Lymphadenopathy:     Cervical: No cervical adenopathy.  Skin:    Findings: No erythema or rash.  Neurological:     Cranial Nerves: No cranial nerve deficit.     Motor: Weakness present. No abnormal muscle tone.     Coordination: Coordination normal.     Gait: Gait abnormal.     Deep Tendon Reflexes: Reflexes normal.  Psychiatric:        Behavior: Behavior normal.        Thought Content: Thought content normal.        Judgment: Judgment normal.   In a w/c Warts   Procedure Note :     Procedure : Cryosurgery   Indication:  Wart(s)     Risks including unsuccessful procedure , bleeding, infection, bruising, scar, a need for a repeat  procedure and others were explained to the patient  in detail as well as the benefits. Informed consent was obtained verbally.    4 lesion(s)  on  face  was/were treated with liquid nitrogen on a Q-tip in a usual fasion . Band-Aid was applied and antibiotic ointment was given for a later use.   Tolerated well. Complications none.   Postprocedure instructions :     Keep the wounds clean. You can wash them with liquid soap and water. Pat dry with gauze or a Kleenex tissue  Before applying antibiotic ointment and a Band-Aid.   You need to report immediately  if  any signs of infection develop.    Lab Results  Component Value Date   WBC 6.9 12/22/2021   HGB 11.5 (L) 12/22/2021   HCT 36.9 12/22/2021   PLT 250 12/22/2021   GLUCOSE 107 (H) 12/22/2021   CHOL 128 06/29/2018   TRIG 184 (H) 06/29/2018   HDL 35 (L) 06/29/2018   LDLDIRECT 187.0 07/14/2015   LDLCALC 56 06/29/2018   ALT 19 12/22/2021   AST 18 12/22/2021   NA 142 12/22/2021   K 4.4 12/22/2021   CL 105 12/22/2021   CREATININE 1.00 12/22/2021   BUN 15 12/22/2021   CO2 30 12/22/2021   TSH 1.84 01/01/2019   INR 0.9 09/22/2019   HGBA1C  09/17/2008    5.0 (NOTE)   The ADA recommends the following therapeutic goal for glycemic   control related to Hgb A1C measurement:   Goal of Therapy:   < 7.0% Hgb A1C   Reference: American Diabetes Association: Clinical Practice   Recommendations 2008, Diabetes Care,  2008, 31:(Suppl 1).    No results found.  Assessment & Plan:   Problem List  Items Addressed This Visit     Generalized anxiety disorder    Chronic  Trazodone at hs Cont on Xanax prn  Potential benefits of a long term benzodiazepines  use as well as potential risks  and complications were explained to the patient and were aknowledged.       Relevant Medications   ALPRAZolam (XANAX) 0.25 MG tablet   Low back pain    Chronic Continue w/Norco prn  Potential benefits of a long term opioids use as well as potential risks (i.e. addiction risk, apnea etc) and complications  (i.e. Somnolence, constipation and others) were explained to the patient and were aknowledged. Narcane nasal prn Do not take Norco w/Xanax      Relevant Medications   HYDROcodone-acetaminophen (NORCO) 10-325 MG tablet   HYDROcodone-acetaminophen (NORCO) 10-325 MG tablet   HYDROcodone-acetaminophen (NORCO) 10-325 MG tablet   Malignant neoplasm of bronchus and lung (HCC)    Recent PET CT report is in. Pt saw Dr Marin Olp and Dr Sondra Come recently. XRT is to be scheduled.      Relevant Medications   ALPRAZolam (XANAX) 0.25 MG tablet   Warts    New - see cryo         Meds ordered this encounter  Medications   ALPRAZolam (XANAX) 0.25 MG tablet    Sig: Take 1 tablet (0.25 mg total) by mouth at bedtime as needed for anxiety.    Dispense:  90 tablet    Refill:  2    This request is for a new prescription for a controlled substance as required by Federal/State law.   HYDROcodone-acetaminophen (NORCO) 10-325 MG tablet    Sig: Take 1 tablet by mouth every 6 (six) hours as needed for severe pain.    Dispense:  120 tablet    Refill:  0    Please fill on or after 02/16/22   HYDROcodone-acetaminophen (NORCO) 10-325 MG tablet    Sig: Take 1 tablet by mouth every 6 (six) hours as needed for severe pain.    Dispense:  120 tablet    Refill:  0    Please fill on or after 12/18/21   HYDROcodone-acetaminophen (NORCO) 10-325 MG tablet    Sig: Take 1 tablet by mouth every 6 (six) hours as needed for severe pain.    Dispense:  120 tablet    Refill:  0    Please fill on or after 01/17/22      Follow-up: Return in about 3 months (around 03/10/2022) for a follow-up visit.  Walker Kehr, MD

## 2021-12-08 NOTE — Assessment & Plan Note (Signed)
Chronic Continue w/Norco prn  Potential benefits of a long term opioids use as well as potential risks (i.e. addiction risk, apnea etc) and complications (i.e. Somnolence, constipation and others) were explained to the patient and were aknowledged. Narcane nasal prn Do not take Norco w/Xanax

## 2021-12-08 NOTE — Assessment & Plan Note (Signed)
Recent PET CT report is in. Pt saw Dr Marin Olp and Dr Sondra Come recently. XRT is to be scheduled.

## 2021-12-08 NOTE — Assessment & Plan Note (Signed)
Chronic  Trazodone at hs Cont on Xanax prn  Potential benefits of a long term benzodiazepines  use as well as potential risks  and complications were explained to the patient and were aknowledged.

## 2021-12-16 ENCOUNTER — Other Ambulatory Visit: Payer: Self-pay | Admitting: Cardiology

## 2021-12-17 DIAGNOSIS — C3490 Malignant neoplasm of unspecified part of unspecified bronchus or lung: Secondary | ICD-10-CM | POA: Diagnosis not present

## 2021-12-22 ENCOUNTER — Inpatient Hospital Stay: Payer: Medicare HMO | Admitting: Hematology & Oncology

## 2021-12-22 ENCOUNTER — Encounter: Payer: Self-pay | Admitting: Hematology & Oncology

## 2021-12-22 ENCOUNTER — Other Ambulatory Visit: Payer: Self-pay

## 2021-12-22 ENCOUNTER — Inpatient Hospital Stay: Payer: Medicare HMO | Attending: Hematology & Oncology

## 2021-12-22 VITALS — BP 134/33 | HR 78 | Temp 98.1°F | Resp 18 | Ht 65.0 in | Wt 139.4 lb

## 2021-12-22 DIAGNOSIS — C3412 Malignant neoplasm of upper lobe, left bronchus or lung: Secondary | ICD-10-CM | POA: Insufficient documentation

## 2021-12-22 DIAGNOSIS — C3490 Malignant neoplasm of unspecified part of unspecified bronchus or lung: Secondary | ICD-10-CM

## 2021-12-22 DIAGNOSIS — R519 Headache, unspecified: Secondary | ICD-10-CM | POA: Diagnosis not present

## 2021-12-22 DIAGNOSIS — Z79899 Other long term (current) drug therapy: Secondary | ICD-10-CM | POA: Diagnosis not present

## 2021-12-22 DIAGNOSIS — J449 Chronic obstructive pulmonary disease, unspecified: Secondary | ICD-10-CM | POA: Insufficient documentation

## 2021-12-22 DIAGNOSIS — D509 Iron deficiency anemia, unspecified: Secondary | ICD-10-CM | POA: Diagnosis not present

## 2021-12-22 DIAGNOSIS — D500 Iron deficiency anemia secondary to blood loss (chronic): Secondary | ICD-10-CM

## 2021-12-22 LAB — CBC WITH DIFFERENTIAL (CANCER CENTER ONLY)
Abs Immature Granulocytes: 0.03 10*3/uL (ref 0.00–0.07)
Basophils Absolute: 0.1 10*3/uL (ref 0.0–0.1)
Basophils Relative: 1 %
Eosinophils Absolute: 0.2 10*3/uL (ref 0.0–0.5)
Eosinophils Relative: 3 %
HCT: 36.9 % (ref 36.0–46.0)
Hemoglobin: 11.5 g/dL — ABNORMAL LOW (ref 12.0–15.0)
Immature Granulocytes: 0 %
Lymphocytes Relative: 23 %
Lymphs Abs: 1.6 10*3/uL (ref 0.7–4.0)
MCH: 31.8 pg (ref 26.0–34.0)
MCHC: 31.2 g/dL (ref 30.0–36.0)
MCV: 101.9 fL — ABNORMAL HIGH (ref 80.0–100.0)
Monocytes Absolute: 0.4 10*3/uL (ref 0.1–1.0)
Monocytes Relative: 6 %
Neutro Abs: 4.6 10*3/uL (ref 1.7–7.7)
Neutrophils Relative %: 67 %
Platelet Count: 250 10*3/uL (ref 150–400)
RBC: 3.62 MIL/uL — ABNORMAL LOW (ref 3.87–5.11)
RDW: 14.8 % (ref 11.5–15.5)
WBC Count: 6.9 10*3/uL (ref 4.0–10.5)
nRBC: 0 % (ref 0.0–0.2)

## 2021-12-22 LAB — CMP (CANCER CENTER ONLY)
ALT: 19 U/L (ref 0–44)
AST: 18 U/L (ref 15–41)
Albumin: 3.9 g/dL (ref 3.5–5.0)
Alkaline Phosphatase: 132 U/L — ABNORMAL HIGH (ref 38–126)
Anion gap: 7 (ref 5–15)
BUN: 15 mg/dL (ref 8–23)
CO2: 30 mmol/L (ref 22–32)
Calcium: 9.6 mg/dL (ref 8.9–10.3)
Chloride: 105 mmol/L (ref 98–111)
Creatinine: 1 mg/dL (ref 0.44–1.00)
GFR, Estimated: >60 mL/min (ref >60)
Glucose, Bld: 107 mg/dL — ABNORMAL HIGH (ref 70–99)
Potassium: 4.4 mmol/L (ref 3.5–5.1)
Sodium: 142 mmol/L (ref 135–145)
Total Bilirubin: 0.2 mg/dL — ABNORMAL LOW (ref 0.3–1.2)
Total Protein: 6.8 g/dL (ref 6.5–8.1)

## 2021-12-22 LAB — FERRITIN: Ferritin: 39 ng/mL (ref 11–307)

## 2021-12-22 NOTE — Progress Notes (Signed)
Hematology and Oncology Follow Up Visit  Kaylee Hansen [id number] 1956/03/02 66 y.o. 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  Unfortunately, she is not a candidate for stereotactic radiosurgery.  Looks like she is a candidate for external beam radiation but with a shortened course.  She is decided that she has does want to try radiation.  I think this is a reasonable thing to do.  Her problem right now is that she is having bad headaches.  She has had this for about 3 weeks or so.  She has had headaches on and off in the past.  I put her on some Topamax which seemed to help a little bit.  I do think given that she does have recurrent lung cancer, that we probably need to consider a CT of the brain to see if there is anything going on up there.  I do not think that she could tolerate an MRI.  Otherwise, she still has her lung problems.  She has horrible COPD.  That she somehow does not qualify for portable oxygen.  Her appetite is marginal.  She has had no nausea or vomiting.  There has been no fever.  She has had no bleeding.  She is not coughing up blood.  When we last saw her, her ferritin was 79 with iron saturation of 34%.  Overall, I would have to say that her performance status for now is ECOG 2, at best.     Medications:  Allergies as of 12/22/2021       Reactions   Nitrofuran Derivatives Other (See Comments)   confusion   Oxycodone Hcl Other (See Comments)   Knocked her out for 6 days   Morphine Other (See Comments)   "Makes me go crazy."    Penicillins Hives   Has patient had a PCN reaction causing immediate rash, facial/tongue/throat swelling, SOB or lightheadedness with  hypotension: unknown Has patient had a PCN reaction causing severe rash involving mucus membranes or skin necrosis: unknown Has patient had a PCN reaction that required hospitalization No Has patient had a PCN reaction occurring within the last 10 years: No If all of the above answers are "NO", then may proceed with Cephalosporin use. Other reaction(s): Rash-Generalized   Tape Other (See Comments)   Skin turns red and burns        Medication List        Accurate as of December 22, 2021  4:04 PM. If you have any questions, ask your nurse or doctor.          albuterol 108 (90 Base) MCG/ACT inhaler Commonly known as: VENTOLIN HFA Inhale 2 puffs into the lungs every 6 (six) hours as needed for wheezing or shortness of breath.   albuterol (2.5 MG/3ML) 0.083% nebulizer solution Commonly known as: PROVENTIL INHALE 1 VIAL BY NEBULIZATION EVERY 4 HOURS AS NEEDED FOR WHEEZING OR SHORTNESS OF BREATH.   ALPRAZolam 0.25 MG tablet Commonly known as: XANAX Take 1 tablet (0.25 mg total) by mouth at bedtime as needed for anxiety.   aspirin EC 81 MG tablet Take 1 tablet (81 mg total) by mouth daily.   bisacodyl 5 MG EC  tablet Commonly known as: Dulcolax Take 2 tablets (10 mg total) by mouth at bedtime.   Breztri Aerosphere 160-9-4.8 MCG/ACT Aero Generic drug: Budeson-Glycopyrrol-Formoterol Inhale 2 puffs into the lungs in the morning and at bedtime.   calcium carbonate 500 MG chewable tablet Commonly known as: TUMS - dosed in mg elemental calcium Chew 1 tablet by mouth daily as needed for indigestion or heartburn.   CoQ10 100 MG Caps Take 100 mg by mouth in the morning.   diltiazem 360 MG 24 hr capsule Commonly known as: CARDIZEM CD TAKE 1 CAPSULE BY MOUTH EVERY DAY   [name] As directed   furosemide 20 MG tablet Commonly known as: LASIX TAKE 1 TABLET BY MOUTH EVERY DAY   guaiFENesin 100 MG/5ML liquid Commonly known as: ROBITUSSIN Take 200 mg by mouth 2 (two) times  daily as needed for cough.   HYDROcodone-acetaminophen 10-325 MG tablet Commonly known as: NORCO Take 1 tablet by mouth every 6 (six) hours as needed for severe pain.   HYDROcodone-acetaminophen 10-325 MG tablet Commonly known as: NORCO Take 1 tablet by mouth every 6 (six) hours as needed for severe pain.   HYDROcodone-acetaminophen 10-325 MG tablet Commonly known as: NORCO Take 1 tablet by mouth every 6 (six) hours as needed for severe pain.   hydroxypropyl methylcellulose / hypromellose 2.5 % ophthalmic solution Commonly known as: ISOPTO TEARS / GONIOVISC Place 1 drop into both eyes 3 (three) times daily as needed for dry eyes.   ipratropium-albuterol 0.5-2.5 (3) MG/3ML Soln Commonly known as: DUONEB Take 3 mLs by nebulization every 4 (four) hours as needed.   isosorbide mononitrate 120 MG 24 hr tablet Commonly known as: IMDUR TAKE 1 TABLET BY MOUTH EVERY DAY   ketoconazole 2 % cream Commonly known as: NIZORAL APPLY TO AFFECTED AREA EVERY DAY What changed:  how much to take how to take this when to take this reasons to take this   levothyroxine 150 MCG tablet Commonly known as: SYNTHROID TAKE 1 TABLET BY MOUTH EVERY DAY   naloxegol oxalate 12.5 MG Tabs tablet Commonly known as: MOVANTIK Take 1 tablet (12.5 mg total) by mouth daily.   naproxen sodium 220 MG tablet Commonly known as: ALEVE Take 220 mg by mouth daily as needed (For Headache).   nitroGLYCERIN 0.4 MG SL tablet Commonly known as: NITROSTAT DISSOLVE ONE TABLET UNDER THE TONGUE EVERY 5 MINUTES AS NEEDED FOR CHEST PAIN.  DO NOT EXCEED A TOTAL OF 3 DOSES IN 15 MINUTES   OXYGEN Inhale 2.5 L/min into the lungs at bedtime.   pantoprazole 40 MG tablet Commonly known as: PROTONIX TAKE 1 TABLET BY MOUTH DAILY BEFORE BREAKFAST   PARoxetine 40 MG tablet Commonly known as: PAXIL TAKE 1 TABLET BY MOUTH EVERY DAY   phenytoin 100 MG ER capsule Commonly known as: DILANTIN TAKE 100 MG EVERY MORNING AND 200  MG EVERY NIGHT.   promethazine 25 MG tablet Commonly known as: PHENERGAN Take 1 tablet (25 mg total) by mouth every 8 (eight) hours as needed for nausea or vomiting.   rosuvastatin 20 MG tablet Commonly known as: CRESTOR TAKE 1 TABLET BY MOUTH EVERY DAY   SUMAtriptan 100 MG tablet Commonly known as: IMITREX MAY REPEAT IN 2 HOURS IF HEADACHE PERSISTS OR RECURS.   topiramate 50 MG tablet Commonly known as: Topamax Take 1 tablet (50 mg total) by mouth 2 (two) times daily as needed. Take as needed for headache   traZODone 100 MG tablet Commonly known as: DESYREL TAKE 1 TABLET  BY MOUTH EVERYDAY AT BEDTIME   Vitamin D (Ergocalciferol) 1.25 MG (50000 UNIT) Caps capsule Commonly known as: DRISDOL TAKE 1 CAPSULE (50,000 UNITS TOTAL) BY MOUTH EVERY MONDAY.   ZzzQuil 25 MG Caps Generic drug: diphenhydrAMINE HCl (Sleep) Take 50 mg by mouth at bedtime.        Allergies:  Allergies  Allergen Reactions   Nitrofuran Derivatives Other (See Comments)    confusion   Oxycodone Hcl Other (See Comments)    Knocked her out for 6 days   Morphine Other (See Comments)    "Makes me go crazy."    Penicillins Hives    Has patient had a PCN reaction causing immediate rash, facial/tongue/throat swelling, SOB or lightheadedness with hypotension: unknown Has patient had a PCN reaction causing severe rash involving mucus membranes or skin necrosis: unknown Has patient had a PCN reaction that required hospitalization No Has patient had a PCN reaction occurring within the last 10 years: No If all of the above answers are "NO", then may proceed with Cephalosporin use.  Other reaction(s): Rash-Generalized   Tape Other (See Comments)    Skin turns red and burns    Past Medical History, Surgical history, Social history, and Family History were reviewed and updated.  Review of Systems: Review of Systems  Constitutional: Negative.   HENT: Negative.    Eyes: Negative.   Respiratory:  Positive for  cough, sputum production, shortness of breath and wheezing.   Cardiovascular:  Positive for palpitations and leg swelling.  Gastrointestinal:  Positive for diarrhea and nausea.  Genitourinary:  Positive for frequency and urgency.  Musculoskeletal:  Positive for joint pain and myalgias.  Skin: Negative.   Neurological:  Positive for weakness.  Endo/Heme/Allergies: Negative.   Psychiatric/Behavioral: Negative.       Physical Exam:  height is 5\' 5"  (1.651 m) and weight is 139 lb 6.4 oz (63.2 kg). Her oral temperature is 98.1 F (36.7 C). Her blood pressure is 134/33 (abnormal) and her pulse is 78. Her respiration is 18 and oxygen saturation is 100%.   Wt Readings from Last 3 Encounters:  12/22/21 139 lb 6.4 oz (63.2 kg)  12/08/21 140 lb 6 oz (63.7 kg)  11/18/21 141 lb (64 kg)    Physical Exam Vitals reviewed.  HENT:     Head: Normocephalic and atraumatic.  Eyes:     Pupils: Pupils are equal, round, and reactive to light.  Cardiovascular:     Rate and Rhythm: Normal rate and regular rhythm.     Heart sounds: Normal heart sounds.  Pulmonary:     Effort: Pulmonary effort is normal.     Breath sounds: Normal breath sounds.     Comments: Pulmonary exam shows wheezes or rhonchi bilaterally.  She has relatively decent air movement. Abdominal:     General: Bowel sounds are normal.     Palpations: Abdomen is soft.  Musculoskeletal:        General: No tenderness or deformity. Normal range of motion.     Cervical back: Normal range of motion.     Comments: Extremities does show some mild edema in the legs bilaterally.  Lymphadenopathy:     Cervical: No cervical adenopathy.  Skin:    General: Skin is warm and dry.     Findings: No erythema or rash.  Neurological:     Mental Status: She is alert and oriented to person, place, and time.  Psychiatric:        Behavior: Behavior normal.  Thought Content: Thought content normal.        Judgment: Judgment normal.      Lab  Results  Component Value Date   WBC 6.9 12/22/2021   HGB 11.5 (L) 12/22/2021   HCT 36.9 12/22/2021   MCV 101.9 (H) 12/22/2021   PLT 250 12/22/2021   Lab Results  Component Value Date   FERRITIN 79 11/18/2021   IRON 80 11/18/2021   TIBC 238 (L) 11/18/2021   UIBC 158 11/18/2021   IRONPCTSAT 34 (H) 11/18/2021   Lab Results  Component Value Date   RETICCTPCT 1.8 11/18/2021   RBC 3.62 (L) 12/22/2021   No results found for: "KPAFRELGTCHN", "LAMBDASER", "KAPLAMBRATIO" No results found for: "IGGSERUM", "IGA", "IGMSERUM" No results found for: "TOTALPROTELP", "ALBUMINELP", "A1GS", "A2GS", "BETS", "BETA2SER", "GAMS", "MSPIKE", "SPEI"   Chemistry      Component Value Date/Time   NA 142 12/22/2021 1510   NA 144 07/08/2018 1059   K 4.4 12/22/2021 1510   CL 105 12/22/2021 1510   CO2 30 12/22/2021 1510   BUN 15 12/22/2021 1510   BUN 12 07/08/2018 1059   CREATININE 1.00 12/22/2021 1510      Component Value Date/Time   CALCIUM 9.6 12/22/2021 1510   ALKPHOS 132 (H) 12/22/2021 1510   AST 18 12/22/2021 1510   ALT 19 12/22/2021 1510   BILITOT 0.2 (L) 12/22/2021 1510       Impression and Plan: Ms. Lauman is a very pleasant 66 yo caucasian female with clinical bronchogenic carcinoma and iron deficiency anemia.   She completed SBRT in May 2021.   She now has a new problem.  Again, this is in the right lower lobe.  Hopefully, she will move ahead with the radiation.  I spoke with Dr. Sondra Come today.  He said his office will get her in.  We will get a CT of the brain.  We will try to get this tomorrow.  We will see what her iron studies look like.  I just feel bad that she has always issues with the underlying COPD.  This really is going dictate her quality of life.  I would like to see her back myself probably in about a month or so.  Hopefully, by then, she would have finished her radiation.   [name], MD 6/29/20234:04 PM

## 2021-12-23 ENCOUNTER — Telehealth (HOSPITAL_BASED_OUTPATIENT_CLINIC_OR_DEPARTMENT_OTHER): Payer: Self-pay

## 2021-12-23 ENCOUNTER — Ambulatory Visit (HOSPITAL_BASED_OUTPATIENT_CLINIC_OR_DEPARTMENT_OTHER): Admission: RE | Admit: 2021-12-23 | Payer: Medicare HMO | Source: Ambulatory Visit

## 2021-12-23 LAB — IRON AND IRON BINDING CAPACITY (CC-WL,HP ONLY)
Iron: 65 ug/dL (ref 28–170)
Saturation Ratios: 26 % (ref 10.4–31.8)
TIBC: 253 ug/dL (ref 250–450)
UIBC: 188 ug/dL (ref 148–442)

## 2021-12-26 ENCOUNTER — Encounter (HOSPITAL_BASED_OUTPATIENT_CLINIC_OR_DEPARTMENT_OTHER): Payer: Self-pay

## 2021-12-26 ENCOUNTER — Ambulatory Visit (HOSPITAL_BASED_OUTPATIENT_CLINIC_OR_DEPARTMENT_OTHER)
Admission: RE | Admit: 2021-12-26 | Discharge: 2021-12-26 | Disposition: A | Discharge status: Home / Self Care | Payer: Medicare HMO | Source: Ambulatory Visit | Attending: Hematology & Oncology | Admitting: Hematology & Oncology

## 2021-12-26 ENCOUNTER — Other Ambulatory Visit: Payer: Self-pay

## 2021-12-26 DIAGNOSIS — R519 Headache, unspecified: Secondary | ICD-10-CM | POA: Diagnosis not present

## 2021-12-26 DIAGNOSIS — I6782 Cerebral ischemia: Secondary | ICD-10-CM | POA: Diagnosis not present

## 2021-12-26 DIAGNOSIS — C3490 Malignant neoplasm of unspecified part of unspecified bronchus or lung: Secondary | ICD-10-CM | POA: Insufficient documentation

## 2021-12-26 MED ORDER — IOHEXOL 300 MG/ML  SOLN
100.0000 mL | Freq: Once | INTRAMUSCULAR | Status: AC | PRN
  Administered 2021-12-26: 75 mL via INTRAVENOUS

## 2021-12-26 NOTE — Progress Notes (Signed)
Thoracic Location of Tumor / Histology: superior portion of the right lower lobe   Tobacco/Marijuana/Snuff/ETOH use: Smokes a little less than a pack per day.  Past/Anticipated interventions by cardiothoracic surgery, if any: no  Past/Anticipated interventions by medical oncology, if any: no  Signs/Symptoms Weight changes, if any: no Respiratory complaints, if any: has COPD and uses oxygen at night. Reports shortness of breath and a productive cough. Hemoptysis, if any: yes - some occasionally Pain issues, if any:  yes - has generalized pain and in her left chest.  Rates at an 8/10.  SAFETY ISSUES: Prior radiation? 11/06/2019 through 11/13/2019 left lung 54 Gy in 3 fractions Pacemaker/ICD? no  Possible current pregnancy?no Is the patient on methotrexate? no  Current Complaints / other details:  None noted.

## 2021-12-28 ENCOUNTER — Telehealth: Payer: Self-pay

## 2021-12-28 ENCOUNTER — Ambulatory Visit
Admission: RE | Admit: 2021-12-28 | Discharge: 2021-12-28 | Disposition: A | Discharge status: Home / Self Care | Payer: Medicare HMO | Source: Ambulatory Visit | Attending: Radiation Oncology | Admitting: Radiation Oncology

## 2021-12-28 ENCOUNTER — Other Ambulatory Visit: Payer: Self-pay

## 2021-12-28 ENCOUNTER — Encounter: Payer: Self-pay | Admitting: Radiation Oncology

## 2021-12-28 VITALS — BP 141/30 | HR 81 | Temp 97.5°F | Resp 18 | Wt 139.8 lb

## 2021-12-28 DIAGNOSIS — J449 Chronic obstructive pulmonary disease, unspecified: Secondary | ICD-10-CM | POA: Diagnosis not present

## 2021-12-28 DIAGNOSIS — C3431 Malignant neoplasm of lower lobe, right bronchus or lung: Secondary | ICD-10-CM | POA: Diagnosis not present

## 2021-12-28 DIAGNOSIS — I2510 Atherosclerotic heart disease of native coronary artery without angina pectoris: Secondary | ICD-10-CM | POA: Insufficient documentation

## 2021-12-28 DIAGNOSIS — K219 Gastro-esophageal reflux disease without esophagitis: Secondary | ICD-10-CM | POA: Insufficient documentation

## 2021-12-28 DIAGNOSIS — Z79899 Other long term (current) drug therapy: Secondary | ICD-10-CM | POA: Insufficient documentation

## 2021-12-28 DIAGNOSIS — Z8711 Personal history of peptic ulcer disease: Secondary | ICD-10-CM | POA: Insufficient documentation

## 2021-12-28 DIAGNOSIS — C3490 Malignant neoplasm of unspecified part of unspecified bronchus or lung: Secondary | ICD-10-CM

## 2021-12-28 DIAGNOSIS — I6782 Cerebral ischemia: Secondary | ICD-10-CM | POA: Diagnosis not present

## 2021-12-28 DIAGNOSIS — E785 Hyperlipidemia, unspecified: Secondary | ICD-10-CM | POA: Diagnosis not present

## 2021-12-28 DIAGNOSIS — F17210 Nicotine dependence, cigarettes, uncomplicated: Secondary | ICD-10-CM | POA: Diagnosis not present

## 2021-12-28 DIAGNOSIS — E039 Hypothyroidism, unspecified: Secondary | ICD-10-CM | POA: Diagnosis not present

## 2021-12-28 DIAGNOSIS — I252 Old myocardial infarction: Secondary | ICD-10-CM | POA: Insufficient documentation

## 2021-12-28 DIAGNOSIS — R69 Illness, unspecified: Secondary | ICD-10-CM | POA: Diagnosis not present

## 2021-12-28 DIAGNOSIS — Z79890 Hormone replacement therapy (postmenopausal): Secondary | ICD-10-CM | POA: Insufficient documentation

## 2021-12-28 DIAGNOSIS — Z9049 Acquired absence of other specified parts of digestive tract: Secondary | ICD-10-CM | POA: Diagnosis not present

## 2021-12-28 DIAGNOSIS — Z8673 Personal history of transient ischemic attack (TIA), and cerebral infarction without residual deficits: Secondary | ICD-10-CM | POA: Insufficient documentation

## 2021-12-28 DIAGNOSIS — Z923 Personal history of irradiation: Secondary | ICD-10-CM | POA: Insufficient documentation

## 2021-12-28 DIAGNOSIS — Z86010 Personal history of colonic polyps: Secondary | ICD-10-CM | POA: Insufficient documentation

## 2021-12-28 DIAGNOSIS — Z85118 Personal history of other malignant neoplasm of bronchus and lung: Secondary | ICD-10-CM | POA: Diagnosis not present

## 2021-12-28 DIAGNOSIS — R918 Other nonspecific abnormal finding of lung field: Secondary | ICD-10-CM | POA: Diagnosis not present

## 2021-12-28 NOTE — Progress Notes (Signed)
Radiation Oncology         [phone] ________________________________  Outpatient Re-Consultation  Name: Kaylee Hansen MRN: [id number]  Date: 12/28/2021  DOB: May 04, 1956  HM:[id number], Kaylee Lacks, MD  [name], MD   REFERRING PHYSICIAN: [name], MD  DIAGNOSIS: The encounter diagnosis was Malignant neoplasm of bronchus and lung ([place]).  The primary encounter diagnosis was Malignant neoplasm of bronchus and lung ([place]). A diagnosis of Cancer of lingula of lung ([place]) was also pertinent to this visit.   New right lower lobe nodule and right hilar adenopathy, PET positive   Left lingular nodule - likely bronchogenic carcinoma   Interval since last radiation: 2 years, 1 month, and 15 days    Radiation Treatment Dates: 11/06/2019 through 11/13/2019 Site Technique Total Dose (Gy) Dose per Fx (Gy) Completed Fx Beam Energies  Lung, Left: Lung_Lt SBRT 54/54 18 3/3 6XFFF    Interval History / Narrative ::Kaylee Hansen is a 66 y.o. female who is accompanied by her husband. she returns today for further discussion of potential radiation therapy in management of her recently diagnosed RLL nodule and right hilar adenopathy. To review from out last visit on 10/26/21, we discussed how her performance status and respiratory issues did not make her a good candidate for biopsy of this area. 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 She continues have some radiation changes from this in the area of PET active disease. Given the location of the PET active area, I informed her that she would not be a good candidate for SBRT, but that she could potentially be a candidate for ultra hypofractionated accelerated radiation therapy. However, I discussed that this would be with increased risk given her previous RT to this area many years ago. Following our discussion, the patient and her  husband were unsure if they wanted to proceed with radiation therapy, and opted to meet with Dr. Marin Olp for further discussion prior to making a final decision regarding radiation treatment.   Accordingly, the patient met with Dr. Marin Olp on 11/18/21 for further discussion. Dr. Marin Olp informed the patient that she would likely do okay with radiation, but the patient was still noted to be indecisive about this. Per her most recent follow up with Dr. Marin Olp on 12/22/21, the patient endorsed severe headaches x 3 weeks. Given so, Dr. Marin Olp advised a CT of the brain for further evaluation.   Subsequent CT of the head with and without contrast on 12/26/21 showed no evidence of acute intracranial abnormality or intracranial metastatic disease. CT also showed unchanged moderate cerebral atrophy and mild chronic small vessel ischemic disease, and innumerable small lucencies throughout the skull, unchanged from prior study, and indeterminate in etiology.   After careful consideration the patient and her husband have decided that she proceed with radiation therapy for her right PET positive medially placed pulmonary nodule with associated hilar adenopathy.    PAST MEDICAL HISTORY:  Past Medical History:  Diagnosis Date   Acute respiratory failure following trauma and surgery ([place]) 08/26/2012   Anxiety    Aortic insufficiency with aortic stenosis 04/2017   TTE December 2019: Mild AS with severe regurgitation.  Mild LA dilation.;;  TEE January 2016: Severe-type III aortic regurgitation (holodiastolic flow reversal in the a sending aorta & vena contracta =6.    CAD S/P percutaneous coronary angioplasty 11/2017   Proximal RCA PCI  Synergy DES 3.5 mm x 16 mm (3.8 mm). Ost-mLM 30%. Ost-prox Cx 40%.; f/u Cath 03/2021: widely patent RCA stent. Ost-prox LCx 30%. Normal LAD-D2.   Cancer ([place])    Collagenous colitis    Colon adenomas 2011   Constipation    Chronic abdominal pain and constipation   COPD  (chronic obstructive pulmonary disease) (HCC)    Depression    Diverticulosis    Esophageal stricture 11/08/2012   Ulcer noted in 2010   GERD (gastroesophageal reflux disease)    Helicobacter pylori gastritis 2010   Pylera Tx   History of cholecystectomy    Hx of appendectomy    Hx of cancer of lung 1999   Hx of hysterectomy    Hyperlipidemia    Hypothyroidism    Iron deficiency anemia due to chronic blood loss 10/01/2019   Low back pain    Non-STEMI (non-ST elevated myocardial infarction) ([place]) 06/2018   RCA PCI   Osteoarthritis of knee    bilateral knee   Seizures (HCC)    Stroke (HCC)    CVA, hx of 97   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[place])     PAST SURGICAL HISTORY: Past Surgical History:  Procedure Laterality Date   ABDOMINAL HYSTERECTOMY     complete 1992   ABDOMINAL SURGERY     Exploratory   APPENDECTOMY     BALLOON DILATION N/A 11/08/2012   Procedure: BALLOON DILATION;  Surgeon: Gatha Mayer, MD;  Location: [facility];  Service: Endoscopy;  Laterality: N/A;   CHOLECYSTECTOMY     COLONOSCOPY W/ BIOPSIES     multiple   CORONARY STENT INTERVENTION N/A 06/28/2018   Procedure: CORONARY STENT INTERVENTION;  Surgeon: Martinique, Peter M, MD;  Location: [facility];  Service: Cardiovascular;  Laterality: N/A;  90% prox RCA -> PCI with Synergy DES 3.5 mm x 16 mm (3.8 mm).   CT CTA CORONARY W/CA SCORE W/CM &/OR WO/CM  05/2017   Coronary calcium score 2.9. Intermediate risk. Unusual noncalcified plaque in RCA --FFR negative   ESOPHAGOGASTRODUODENOSCOPY     w/baloon x 2   ESOPHAGOGASTRODUODENOSCOPY N/A 11/08/2012   Procedure: ESOPHAGOGASTRODUODENOSCOPY (EGD);  Surgeon: Gatha Mayer, MD;  Location: [facility];  Service: Endoscopy;  Laterality: N/A;   ESOPHAGOGASTRODUODENOSCOPY (EGD) WITH PROPOFOL N/A 08/25/2019   Procedure: ESOPHAGOGASTRODUODENOSCOPY (EGD) WITH PROPOFOL;  Surgeon: Gatha Mayer, MD;  Location: [facility];  Service: Endoscopy;  Laterality: N/A;   LEFT HEART  CATH AND CORONARY ANGIOGRAPHY N/A 06/28/2018   Procedure: LEFT HEART CATH AND CORONARY ANGIOGRAPHY;  Surgeon: Martinique, Peter M, MD;  Location: [facility];  Service: Cardiovascular:  90% prox RCA -> DES PCI. Ost-mLM 30%. Ost-prox Cx 40%.   EF 55-65%. LVEDP 15 mmHg.    MALONEY DILATION  08/25/2019   Procedure: MALONEY DILATION;  Surgeon: Gatha Mayer, MD;  Location: [facility];  Service: Endoscopy;;   RIGHT HEART CATH N/A 07/11/2018   Procedure: RIGHT HEART CATH;  Surgeon: Leonie Man, MD;  Location: [facility];;   Relatively normal Right Heart Cath pressures: PA pressure 26/14 mmHg-mean 20 mmHg; PCWP 13 mmHg.  RAP 6 mmHg, RVP 28/3 mmHg-EDP 10 mmHg. Severely reduced cardiac output and index by Fick: 3.63 and 2.07.   RIGHT/LEFT HEART CATH AND CORONARY ANGIOGRAPHY N/A 04/01/2021   Procedure: RIGHT/LEFT HEART CATH AND CORONARY ANGIOGRAPHY;  Surgeon: Leonie Man, MD;  Location: [facility];; Very large-dominant RCA with widely patent stent.  LCx ost-prox 30%. Diminutive LAD with  very large D2,.RHC: Mildly Reduced CO-CI (4.7-2.64).  LVEDP 20 mmHg.  PAP 27/6 mmHg with PCWP 19 mmHg.   TEE WITHOUT CARDIOVERSION N/A 07/11/2018   Procedure: TRANSESOPHAGEAL ECHOCARDIOGRAM (TEE);  Surgeon: [name], MD;  Location: [facility];  Service: Cardiology;;  Severe aortic regurgitation-type III. (suggested by holodiastolic flow reversal and descending aorta-vena contracta 6 mm).   TOTAL HIP ARTHROPLASTY     Left   TRANSTHORACIC ECHOCARDIOGRAM  05/2018   EF 60-65%.  No R WMA.  GR 1 DD.  Mild aortic stenosis with severe regurgitation.  Mild MR..  Only mild LV dilation noted.   TRANSTHORACIC ECHOCARDIOGRAM  12/2018    EF 60-65%. Mild LVH. Gr1 DD. Mod AoV thickening. Mod-Severe AI, ~ mlid AS.    FAMILY HISTORY:  Family History  Problem Relation Age of Onset   Coronary artery disease Mother    Diabetes Mother    Hypertension Father    Diabetes Son    Coronary artery  disease Other        grandmother, grandfather   Kidney disease Other        aunt   Kidney cancer Sister    Colon cancer Neg Hx        colon   Stomach cancer Neg Hx    Esophageal cancer Neg Hx    Pancreatic cancer Neg Hx    Liver disease Neg Hx     SOCIAL HISTORY:  Social History   Tobacco Use   Smoking status: Every Day    Packs/day: 1.00    Years: 48.00    Total pack years: 48.00    Types: Cigarettes   Smokeless tobacco: Never  Vaping Use   Vaping Use: Never used  Substance Use Topics   Alcohol use: No   Drug use: No    ALLERGIES:  Allergies  Allergen Reactions   Nitrofuran Derivatives Other (See Comments)    confusion   Oxycodone Hcl Other (See Comments)    Knocked her out for 6 days   Morphine Other (See Comments)    "Makes me go crazy."    Penicillins Hives    Has patient had a PCN reaction causing immediate rash, facial/tongue/throat swelling, SOB or lightheadedness with hypotension: unknown Has patient had a PCN reaction causing severe rash involving mucus membranes or skin necrosis: unknown Has patient had a PCN reaction that required hospitalization No Has patient had a PCN reaction occurring within the last 10 years: No If all of the above answers are "NO", then may proceed with Cephalosporin use.  Other reaction(s): Rash-Generalized   Tape Other (See Comments)    Skin turns red and burns    MEDICATIONS:  Current Outpatient Medications  Medication Sig Dispense Refill   albuterol (PROVENTIL HFA;VENTOLIN HFA) 108 (90 Base) MCG/ACT inhaler Inhale 2 puffs into the lungs every 6 (six) hours as needed for wheezing or shortness of breath. 1 Inhaler 5   albuterol (PROVENTIL) (2.5 MG/3ML) 0.083% nebulizer solution INHALE 1 VIAL BY NEBULIZATION EVERY 4 HOURS AS NEEDED FOR WHEEZING OR SHORTNESS OF BREATH. 150 mL 5   ALPRAZolam (XANAX) 0.25 MG tablet Take 1 tablet (0.25 mg total) by mouth at bedtime as needed for anxiety. 90 tablet 2   bisacodyl (DULCOLAX) 5 MG  EC tablet Take 2 tablets (10 mg total) by mouth at bedtime. 30 tablet    Budeson-Glycopyrrol-Formoterol (BREZTRI AEROSPHERE) 160-9-4.8 MCG/ACT AERO Inhale 2 puffs into the lungs in the morning and at bedtime. 10.7 g 5   calcium carbonate (  TUMS - DOSED IN MG ELEMENTAL CALCIUM) 500 MG chewable tablet Chew 1 tablet by mouth daily as needed for indigestion or heartburn.     Coenzyme Q10 (COQ10) 100 MG CAPS Take 100 mg by mouth in the morning.     diltiazem (CARDIZEM CD) 360 MG 24 hr capsule TAKE 1 CAPSULE BY MOUTH EVERY DAY 90 capsule 2   diphenhydrAMINE HCl, Sleep, (ZZZQUIL) 25 MG CAPS Take 50 mg by mouth at bedtime.     furosemide (LASIX) 20 MG tablet TAKE 1 TABLET BY MOUTH EVERY DAY 90 tablet 2   guaiFENesin (ROBITUSSIN) 100 MG/5ML liquid Take 200 mg by mouth 2 (two) times daily as needed for cough.     HYDROcodone-acetaminophen (NORCO) 10-325 MG tablet Take 1 tablet by mouth every 6 (six) hours as needed for severe pain. 120 tablet 0   HYDROcodone-acetaminophen (NORCO) 10-325 MG tablet Take 1 tablet by mouth every 6 (six) hours as needed for severe pain. 120 tablet 0   HYDROcodone-acetaminophen (NORCO) 10-325 MG tablet Take 1 tablet by mouth every 6 (six) hours as needed for severe pain. 120 tablet 0   hydroxypropyl methylcellulose / hypromellose (ISOPTO TEARS / GONIOVISC) 2.5 % ophthalmic solution Place 1 drop into both eyes 3 (three) times daily as needed for dry eyes.     ipratropium-albuterol (DUONEB) 0.5-2.5 (3) MG/3ML SOLN Take 3 mLs by nebulization every 4 (four) hours as needed. 360 mL 0   isosorbide mononitrate (IMDUR) 120 MG 24 hr tablet TAKE 1 TABLET BY MOUTH EVERY DAY 90 tablet 1   ketoconazole (NIZORAL) 2 % cream APPLY TO AFFECTED AREA EVERY DAY (Patient taking differently: Apply 1 application  topically daily as needed for irritation. APPLY TO AFFECTED AREA EVERY DAY) 45 g 1   levothyroxine (SYNTHROID) 150 MCG tablet TAKE 1 TABLET BY MOUTH EVERY DAY 90 tablet 3   naloxegol oxalate  (MOVANTIK) 12.5 MG TABS tablet Take 1 tablet (12.5 mg total) by mouth daily. 30 tablet 0   naproxen sodium (ALEVE) 220 MG tablet Take 220 mg by mouth daily as needed (For Headache).     nitroGLYCERIN (NITROSTAT) 0.4 MG SL tablet DISSOLVE ONE TABLET UNDER THE TONGUE EVERY 5 MINUTES AS NEEDED FOR CHEST PAIN.  DO NOT EXCEED A TOTAL OF 3 DOSES IN 15 MINUTES 25 tablet 6   OXYGEN Inhale 2.5 L/min into the lungs at bedtime.     pantoprazole (PROTONIX) 40 MG tablet TAKE 1 TABLET BY MOUTH DAILY BEFORE BREAKFAST 90 tablet 3   PARoxetine (PAXIL) 40 MG tablet TAKE 1 TABLET BY MOUTH EVERY DAY 90 tablet 3   phenytoin (DILANTIN) 100 MG ER capsule TAKE 100 MG EVERY MORNING AND 200 MG EVERY NIGHT. 270 capsule 3   promethazine (PHENERGAN) 25 MG tablet Take 1 tablet (25 mg total) by mouth every 8 (eight) hours as needed for nausea or vomiting. 30 tablet 0   rosuvastatin (CRESTOR) 20 MG tablet TAKE 1 TABLET BY MOUTH EVERY DAY 90 tablet 3   SUMAtriptan (IMITREX) 100 MG tablet MAY REPEAT IN 2 HOURS IF HEADACHE PERSISTS OR RECURS. 9 tablet 3   topiramate (TOPAMAX) 50 MG tablet Take 1 tablet (50 mg total) by mouth 2 (two) times daily as needed. Take as needed for headache 30 tablet 2   traZODone (DESYREL) 100 MG tablet TAKE 1 TABLET BY MOUTH EVERYDAY AT BEDTIME 90 tablet 1   aspirin EC 81 MG tablet Take 1 tablet (81 mg total) by mouth daily. (Patient not taking: Reported on 11/18/2021) 30  tablet 11   Respiratory Therapy Supplies (FLUTTER) DEVI As directed (Patient not taking: Reported on 12/28/2021) 1 each 0   Vitamin D, Ergocalciferol, (DRISDOL) 1.25 MG (50000 UNIT) CAPS capsule TAKE 1 CAPSULE (50,000 UNITS TOTAL) BY MOUTH EVERY MONDAY. 12 capsule 3   Current Facility-Administered Medications  Medication Dose Route Frequency Provider Last Rate Last Admin   promethazine (PHENERGAN) injection 25 mg  25 mg Intramuscular Q6H PRN Binnie Rail, MD   25 mg at 10/19/21 1625    REVIEW OF SYSTEMS:  A 10+ POINT REVIEW OF SYSTEMS  WAS OBTAINED including neurology, dermatology, psychiatry, cardiac, respiratory, lymph, extremities, GI, GU, musculoskeletal, constitutional, reproductive, HEENT.  She is actually currently not wearing oxygen but normally uses 2.5 L at home.  She is breathing comfortably today without oxygen.  She denies any pain within the chest area significant cough or hemoptysis.   PHYSICAL EXAM:  General: Alert and oriented, in no acute distress, sitting comfortably in her wheelchair, accompanied by her husband HEENT: Head is normocephalic. Extraocular movements are intact.  Neck: Neck is supple, no palpable cervical or supraclavicular lymphadenopathy. Heart: Regular in rate and rhythm with no murmurs, rubs, or gallops. Chest: The right lung is clear.  The left lung exam shows some occasional mild wheezing. Abdomen: Soft, nontender, nondistended, with no rigidity or guarding. Extremities: No cyanosis or edema. Lymphatics: see Neck Exam Skin: No concerning lesions. Musculoskeletal: symmetric strength and muscle tone throughout. Neurologic: Cranial nerves II through XII are grossly intact. No obvious focalities. Speech is fluent. Coordination is intact. Psychiatric: Judgment and insight are intact. Affect is appropriate.   ECOG = 2  0 - Asymptomatic (Fully active, able to carry on all predisease activities without restriction)  1 - Symptomatic but completely ambulatory (Restricted in physically strenuous activity but ambulatory and able to carry out work of a light or sedentary nature. For example, light housework, office work)  2 - Symptomatic, <50% in bed during the day (Ambulatory and capable of all self care but unable to carry out any work activities. Up and about more than 50% of waking hours)  3 - Symptomatic, >50% in bed, but not bedbound (Capable of only limited self-care, confined to bed or chair 50% or more of waking hours)  4 - Bedbound (Completely disabled. Cannot carry on any self-care.  Totally confined to bed or chair)  5 - Death   Eustace Pen MM, Creech RH, Tormey DC, et al. [phone]). "Toxicity and response criteria of the [facility]". [place] Oncol. 5 (6): 649-55  LABORATORY DATA:  Lab Results  Component Value Date   WBC 6.9 12/22/2021   HGB 11.5 (L) 12/22/2021   HCT 36.9 12/22/2021   MCV 101.9 (H) 12/22/2021   PLT 250 12/22/2021   NEUTROABS 4.6 12/22/2021   Lab Results  Component Value Date   NA 142 12/22/2021   K 4.4 12/22/2021   CL 105 12/22/2021   CO2 30 12/22/2021   GLUCOSE 107 (H) 12/22/2021   BUN 15 12/22/2021   CREATININE 1.00 12/22/2021   CALCIUM 9.6 12/22/2021      RADIOGRAPHY: CT HEAD W & WO CONTRAST (5MM)  Result Date: 12/28/2021 CLINICAL DATA:  Non-small cell lung cancer.  Headaches. EXAM: CT HEAD WITHOUT AND WITH CONTRAST TECHNIQUE: Contiguous axial images were obtained from the base of the skull through the vertex without and with intravenous contrast. RADIATION DOSE REDUCTION: This exam was performed according to the departmental dose-optimization program which includes automated exposure control, adjustment  of the mA and/or kV according to patient size and/or use of iterative reconstruction technique. CONTRAST:  63m OMNIPAQUE IOHEXOL 300 MG/ML  SOLN COMPARISON:  Noncontrast head CT 10/06/2020 FINDINGS: The study is mildly motion degraded. Brain: There is no evidence of an acute infarct, intracranial hemorrhage, mass, midline shift, or extra-axial fluid collection. Moderate cerebral atrophy is unchanged. Hypodensities in the cerebral white matter bilaterally are unchanged and nonspecific but compatible with mild chronic small vessel ischemic disease. No abnormal enhancement is identified. Vascular: Calcified atherosclerosis at the skull base. Major dural venous sinuses and large intracranial arteries are grossly patent. Skull: Unchanged appearance of the skull which demonstrates diffusely heterogeneous density with innumerable  small lucencies. Sinuses/Orbits: Visualized paranasal sinuses and mastoid air cells are clear. Bilateral cataract extraction. Other: None. IMPRESSION: 1. No evidence of acute intracranial abnormality or intracranial metastatic disease. 2. Unchanged moderate cerebral atrophy and mild chronic small vessel ischemic disease. 3. Innumerable small lucencies throughout the skull, similar to the prior study and of indeterminate etiology. Electronically Signed   By: ALogan BoresM.D.   On: 12/28/2021 08:21      IMPRESSION: The primary encounter diagnosis was Malignant ne[place] of bronchus and lung ([place]. A diagnosis of Cancer of lingula of [place] was also pertinent to this visit.   New right lower lobe nodule and right hilar adenopathy, PET positive   Left lingular nodule - likely bronchogenic carcinoma status post SBRT in the past.  No active uptake noted on most recent PET scan concerning this area.   We discussed options at this point would be close follow-up or to proceed with radiation therapy.  They understand that this tumor has continued to enlarge and will likely continue without some type of therapeutic intervention.  After careful consideration the patient and her husband have decided that she proceed with radiation therapy.  She does understand this is with increased risk with her previous radiation to this area with her prior history of small cell lung cancer.    PLAN: She will return on July 10 for CT simulation.  She will be treated with SBRT set up techniques but given the location of her lesion she will be treated with ultra hypofractionated accelerated radiation therapy over approximately 10 treatments.   35 minutes of total time was spent for this patient encounter, including preparation, face-to-face counseling with the patient and coordination of care, physical exam, and documentation of the encounter.   ------------------------------------------------  JBlair Promise PhD,  MD  This document serves as a record of services personally performed by JGery Pray MD. It was created on his behalf by ERoney Mans a trained medical scribe. The creation of this record is based on the scribe's personal observations and the provider's statements to them. This document has been checked and approved by the attending provider.

## 2021-12-28 NOTE — Telephone Encounter (Signed)
Per Dr. Antonieta Pert request patient called and notified that her iron level is ok and she does not need any IV iron at this time. Pt also informed that her CT scan of the brain looked good without any obvious areas of cancer. Pt informed she does have some hardening of the arteries. Pt verbalized understanding and questioned whether to keep her radiation appointment this afternoon. Pt aware to keep her radiation appointment for today and this RN reviewed next appointments with patient. Pt verbalized understanding and had no further questions. Pt aware to call with any other questions or concerns.

## 2021-12-29 ENCOUNTER — Telehealth: Payer: Medicare HMO

## 2021-12-29 ENCOUNTER — Encounter: Payer: Self-pay | Admitting: *Deleted

## 2021-12-29 ENCOUNTER — Telehealth: Payer: Self-pay | Admitting: *Deleted

## 2021-12-29 NOTE — Telephone Encounter (Signed)
  Chronic Care Management   Follow Up Note   12/29/2021 Name: Kaylee Hansen MRN: [id number] DOB: Dec 13, 1955  Referred by: Cassandria Anger, MD Reason for referral : Chronic Care Management (CCM RN CM Follow Up Visit; Unsuccessful outreach attempt)  An unsuccessful telephone outreach was attempted today. The patient was referred to the case management team for assistance with care management and care coordination.   Follow Up Plan:  A HIPPA compliant phone message was left for the patient providing contact information and requesting a return call Will place request with scheduling care guide to contact patient to re-schedule today's missed CCM RN follow up telephone appointment if I do not hear back from patient by end of day  Oneta Rack, RN, BSN, [place] [phone]: direct office

## 2022-01-02 ENCOUNTER — Other Ambulatory Visit: Payer: Self-pay

## 2022-01-02 ENCOUNTER — Ambulatory Visit
Admission: RE | Admit: 2022-01-02 | Discharge: 2022-01-02 | Disposition: A | Discharge status: Home / Self Care | Payer: Medicare HMO | Source: Ambulatory Visit | Attending: Radiation Oncology | Admitting: Radiation Oncology

## 2022-01-02 DIAGNOSIS — R911 Solitary pulmonary nodule: Secondary | ICD-10-CM | POA: Diagnosis not present

## 2022-01-02 DIAGNOSIS — Z510 Encounter for antineoplastic radiation therapy: Secondary | ICD-10-CM | POA: Diagnosis not present

## 2022-01-02 DIAGNOSIS — C3431 Malignant neoplasm of lower lobe, right bronchus or lung: Secondary | ICD-10-CM | POA: Diagnosis not present

## 2022-01-02 DIAGNOSIS — R69 Illness, unspecified: Secondary | ICD-10-CM | POA: Diagnosis not present

## 2022-01-02 DIAGNOSIS — C3490 Malignant neoplasm of unspecified part of unspecified bronchus or lung: Secondary | ICD-10-CM

## 2022-01-03 NOTE — Telephone Encounter (Signed)
Husband stated they dont have medication at  [facility],would like to pickup medication at [facility].  HYDROcodone-acetaminophen (NORCO)  10-325 MG tablet  HYDROcodone-acetaminophen (NORCO) 10-325 MG tablet HYDROcodone-acetaminophen (NORCO) 10-325 MG tablet

## 2022-01-05 MED ORDER — HYDROCODONE-ACETAMINOPHEN 10-325 MG PO TABS: 1.0000 | ORAL_TABLET | Freq: Four times a day (QID) | ORAL | 0 refills | Status: DC | PRN

## 2022-01-05 NOTE — Telephone Encounter (Signed)
Ok thx.

## 2022-01-09 ENCOUNTER — Other Ambulatory Visit: Payer: Self-pay | Admitting: Internal Medicine

## 2022-01-16 DIAGNOSIS — C3490 Malignant neoplasm of unspecified part of unspecified bronchus or lung: Secondary | ICD-10-CM | POA: Diagnosis not present

## 2022-01-16 DIAGNOSIS — Z510 Encounter for antineoplastic radiation therapy: Secondary | ICD-10-CM | POA: Diagnosis not present

## 2022-01-16 DIAGNOSIS — C3431 Malignant neoplasm of lower lobe, right bronchus or lung: Secondary | ICD-10-CM | POA: Diagnosis not present

## 2022-01-16 DIAGNOSIS — R911 Solitary pulmonary nodule: Secondary | ICD-10-CM | POA: Diagnosis not present

## 2022-01-16 DIAGNOSIS — R69 Illness, unspecified: Secondary | ICD-10-CM | POA: Diagnosis not present

## 2022-01-17 ENCOUNTER — Other Ambulatory Visit: Payer: Self-pay

## 2022-01-17 ENCOUNTER — Ambulatory Visit: Payer: Medicare HMO

## 2022-01-17 ENCOUNTER — Ambulatory Visit
Admission: RE | Admit: 2022-01-17 | Discharge: 2022-01-17 | Disposition: A | Discharge status: Home / Self Care | Payer: Medicare HMO | Source: Ambulatory Visit | Attending: Radiation Oncology | Admitting: Radiation Oncology

## 2022-01-17 DIAGNOSIS — Z510 Encounter for antineoplastic radiation therapy: Secondary | ICD-10-CM | POA: Diagnosis not present

## 2022-01-17 DIAGNOSIS — C3431 Malignant neoplasm of lower lobe, right bronchus or lung: Secondary | ICD-10-CM | POA: Diagnosis not present

## 2022-01-17 DIAGNOSIS — R911 Solitary pulmonary nodule: Secondary | ICD-10-CM | POA: Diagnosis not present

## 2022-01-17 DIAGNOSIS — R69 Illness, unspecified: Secondary | ICD-10-CM | POA: Diagnosis not present

## 2022-01-17 LAB — RAD ONC ARIA SESSION SUMMARY
Course Elapsed Days: 0
Plan Fractions Treated to Date: 1
Plan Prescribed Dose Per Fraction: 5 Gy
Plan Total Fractions Prescribed: 10
Plan Total Prescribed Dose: 50 Gy
Reference Point Dosage Given to Date: 5 Gy
Reference Point Session Dosage Given: 5 Gy
Session Number: 1

## 2022-01-18 ENCOUNTER — Other Ambulatory Visit: Payer: Self-pay

## 2022-01-18 ENCOUNTER — Ambulatory Visit
Admission: RE | Admit: 2022-01-18 | Discharge: 2022-01-18 | Disposition: A | Discharge status: Home / Self Care | Payer: Medicare HMO | Source: Ambulatory Visit | Attending: Radiation Oncology | Admitting: Radiation Oncology

## 2022-01-18 DIAGNOSIS — C3431 Malignant neoplasm of lower lobe, right bronchus or lung: Secondary | ICD-10-CM | POA: Diagnosis not present

## 2022-01-18 DIAGNOSIS — R911 Solitary pulmonary nodule: Secondary | ICD-10-CM | POA: Diagnosis not present

## 2022-01-18 DIAGNOSIS — Z510 Encounter for antineoplastic radiation therapy: Secondary | ICD-10-CM | POA: Diagnosis not present

## 2022-01-18 DIAGNOSIS — R69 Illness, unspecified: Secondary | ICD-10-CM | POA: Diagnosis not present

## 2022-01-18 LAB — RAD ONC ARIA SESSION SUMMARY
Course Elapsed Days: 1
Plan Fractions Treated to Date: 2
Plan Prescribed Dose Per Fraction: 5 Gy
Plan Total Fractions Prescribed: 10
Plan Total Prescribed Dose: 50 Gy
Reference Point Dosage Given to Date: 10 Gy
Reference Point Session Dosage Given: 5 Gy
Session Number: 2

## 2022-01-19 ENCOUNTER — Ambulatory Visit
Admission: RE | Admit: 2022-01-19 | Discharge: 2022-01-19 | Disposition: A | Discharge status: Home / Self Care | Payer: Medicare HMO | Source: Ambulatory Visit | Attending: Radiation Oncology | Admitting: Radiation Oncology

## 2022-01-19 ENCOUNTER — Other Ambulatory Visit: Payer: Self-pay

## 2022-01-19 ENCOUNTER — Telehealth: Payer: Self-pay | Admitting: *Deleted

## 2022-01-19 DIAGNOSIS — R69 Illness, unspecified: Secondary | ICD-10-CM | POA: Diagnosis not present

## 2022-01-19 DIAGNOSIS — C3431 Malignant neoplasm of lower lobe, right bronchus or lung: Secondary | ICD-10-CM | POA: Diagnosis not present

## 2022-01-19 DIAGNOSIS — R911 Solitary pulmonary nodule: Secondary | ICD-10-CM | POA: Diagnosis not present

## 2022-01-19 DIAGNOSIS — Z510 Encounter for antineoplastic radiation therapy: Secondary | ICD-10-CM | POA: Diagnosis not present

## 2022-01-19 LAB — RAD ONC ARIA SESSION SUMMARY
Course Elapsed Days: 2
Plan Fractions Treated to Date: 3
Plan Prescribed Dose Per Fraction: 5 Gy
Plan Total Fractions Prescribed: 10
Plan Total Prescribed Dose: 50 Gy
Reference Point Dosage Given to Date: 15 Gy
Reference Point Session Dosage Given: 5 Gy
Session Number: 3

## 2022-01-19 NOTE — Chronic Care Management (AMB) (Signed)
  Chronic Care Management Note  01/19/2022 Name: FREDI HURTADO MRN: [id number] DOB: 1955/09/19  [name] is a 66 y.o. year old female who is a primary care patient of Plotnikov, [name], MD and is actively engaged with the care management team. I reached out to [name] by phone today to assist with re-scheduling a follow up visit with the RN Case Manager  Follow up plan: A second unsuccessful telephone outreach attempt made. A HIPAA compliant phone message was left for the patient providing contact information and requesting a return call. The care management team will reach out to the patient again over the next 7 days. If patient returns call to provider office, please advise to call [facility] at [phone].  [place]  Direct Dial: [phone]

## 2022-01-20 ENCOUNTER — Ambulatory Visit
Admission: RE | Admit: 2022-01-20 | Discharge: 2022-01-20 | Disposition: A | Discharge status: Home / Self Care | Payer: Medicare HMO | Source: Ambulatory Visit | Attending: Radiation Oncology | Admitting: Radiation Oncology

## 2022-01-20 ENCOUNTER — Other Ambulatory Visit: Payer: Self-pay

## 2022-01-20 DIAGNOSIS — R69 Illness, unspecified: Secondary | ICD-10-CM | POA: Diagnosis not present

## 2022-01-20 DIAGNOSIS — R911 Solitary pulmonary nodule: Secondary | ICD-10-CM | POA: Diagnosis not present

## 2022-01-20 DIAGNOSIS — Z510 Encounter for antineoplastic radiation therapy: Secondary | ICD-10-CM | POA: Diagnosis not present

## 2022-01-20 DIAGNOSIS — C3431 Malignant neoplasm of lower lobe, right bronchus or lung: Secondary | ICD-10-CM | POA: Diagnosis not present

## 2022-01-20 LAB — RAD ONC ARIA SESSION SUMMARY
Course Elapsed Days: 3
Plan Fractions Treated to Date: 4
Plan Prescribed Dose Per Fraction: 5 Gy
Plan Total Fractions Prescribed: 10
Plan Total Prescribed Dose: 50 Gy
Reference Point Dosage Given to Date: 20 Gy
Reference Point Session Dosage Given: 5 Gy
Session Number: 4

## 2022-01-23 ENCOUNTER — Other Ambulatory Visit: Payer: Self-pay

## 2022-01-23 ENCOUNTER — Inpatient Hospital Stay: Payer: Medicare HMO | Admitting: Hematology & Oncology

## 2022-01-23 ENCOUNTER — Ambulatory Visit
Admission: RE | Admit: 2022-01-23 | Discharge: 2022-01-23 | Disposition: A | Discharge status: Home / Self Care | Payer: Medicare HMO | Source: Ambulatory Visit | Attending: Radiation Oncology | Admitting: Radiation Oncology

## 2022-01-23 ENCOUNTER — Encounter: Payer: Self-pay | Admitting: Hematology & Oncology

## 2022-01-23 ENCOUNTER — Inpatient Hospital Stay: Payer: Medicare HMO | Attending: Hematology & Oncology

## 2022-01-23 VITALS — BP 111/41 | HR 85 | Temp 98.2°F | Resp 19 | Ht 65.0 in | Wt 138.6 lb

## 2022-01-23 DIAGNOSIS — Z510 Encounter for antineoplastic radiation therapy: Secondary | ICD-10-CM | POA: Diagnosis not present

## 2022-01-23 DIAGNOSIS — C3431 Malignant neoplasm of lower lobe, right bronchus or lung: Secondary | ICD-10-CM | POA: Diagnosis not present

## 2022-01-23 DIAGNOSIS — C3412 Malignant neoplasm of upper lobe, left bronchus or lung: Secondary | ICD-10-CM | POA: Insufficient documentation

## 2022-01-23 DIAGNOSIS — D509 Iron deficiency anemia, unspecified: Secondary | ICD-10-CM | POA: Insufficient documentation

## 2022-01-23 DIAGNOSIS — C3490 Malignant neoplasm of unspecified part of unspecified bronchus or lung: Secondary | ICD-10-CM | POA: Diagnosis not present

## 2022-01-23 DIAGNOSIS — R69 Illness, unspecified: Secondary | ICD-10-CM | POA: Diagnosis not present

## 2022-01-23 DIAGNOSIS — R911 Solitary pulmonary nodule: Secondary | ICD-10-CM | POA: Diagnosis not present

## 2022-01-23 LAB — CBC WITH DIFFERENTIAL (CANCER CENTER ONLY)
Abs Immature Granulocytes: 0.02 10*3/uL (ref 0.00–0.07)
Basophils Absolute: 0.1 10*3/uL (ref 0.0–0.1)
Basophils Relative: 1 %
Eosinophils Absolute: 0.2 10*3/uL (ref 0.0–0.5)
Eosinophils Relative: 3 %
HCT: 33.6 % — ABNORMAL LOW (ref 36.0–46.0)
Hemoglobin: 10.7 g/dL — ABNORMAL LOW (ref 12.0–15.0)
Immature Granulocytes: 0 %
Lymphocytes Relative: 19 %
Lymphs Abs: 1.1 10*3/uL (ref 0.7–4.0)
MCH: 31.9 pg (ref 26.0–34.0)
MCHC: 31.8 g/dL (ref 30.0–36.0)
MCV: 100.3 fL — ABNORMAL HIGH (ref 80.0–100.0)
Monocytes Absolute: 0.4 10*3/uL (ref 0.1–1.0)
Monocytes Relative: 7 %
Neutro Abs: 4.1 10*3/uL (ref 1.7–7.7)
Neutrophils Relative %: 70 %
Platelet Count: 239 10*3/uL (ref 150–400)
RBC: 3.35 MIL/uL — ABNORMAL LOW (ref 3.87–5.11)
RDW: 14 % (ref 11.5–15.5)
WBC Count: 5.9 10*3/uL (ref 4.0–10.5)
nRBC: 0 % (ref 0.0–0.2)

## 2022-01-23 LAB — CMP (CANCER CENTER ONLY)
ALT: 16 U/L (ref 0–44)
AST: 21 U/L (ref 15–41)
Albumin: 3.1 g/dL — ABNORMAL LOW (ref 3.5–5.0)
Alkaline Phosphatase: 120 U/L (ref 38–126)
Anion gap: 7 (ref 5–15)
BUN: 14 mg/dL (ref 8–23)
CO2: 26 mmol/L (ref 22–32)
Calcium: 8.7 mg/dL — ABNORMAL LOW (ref 8.9–10.3)
Chloride: 106 mmol/L (ref 98–111)
Creatinine: 0.9 mg/dL (ref 0.44–1.00)
GFR, Estimated: >60 mL/min (ref >60)
Glucose, Bld: 114 mg/dL — ABNORMAL HIGH (ref 70–99)
Potassium: 4.2 mmol/L (ref 3.5–5.1)
Sodium: 139 mmol/L (ref 135–145)
Total Bilirubin: <0.1 mg/dL — ABNORMAL LOW (ref 0.3–1.2)
Total Protein: 6.5 g/dL (ref 6.5–8.1)

## 2022-01-23 LAB — RAD ONC ARIA SESSION SUMMARY
Course Elapsed Days: 6
Plan Fractions Treated to Date: 5
Plan Prescribed Dose Per Fraction: 5 Gy
Plan Total Fractions Prescribed: 10
Plan Total Prescribed Dose: 50 Gy
Reference Point Dosage Given to Date: 25 Gy
Reference Point Session Dosage Given: 5 Gy
Session Number: 5

## 2022-01-23 LAB — FERRITIN: Ferritin: 38 ng/mL (ref 11–307)

## 2022-01-23 NOTE — Progress Notes (Signed)
Hematology and Oncology Follow Up Visit  ELEXIUS MINAR [id number] 05-Jan-1956 66 y.o. 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 Tancredi is here today with her husband for follow-up.  She is having little bit of a tough time with radiation.  She is halfway through radiation.  She is just quite tired.  She does not have any issues with bleeding.  There is a little bit of a cough.  She is not coughing up any purulent mucus.    I am not sure how much she is eating or drinking.  She does have underlying COPD.  She does not wear any oxygen right now.  I am sure she will feel better if she has any oxygen at home.  She does have some headaches.  I think Topamax might help a little bit.  She has had no problems with bowels or bladder.  She has had no leg swelling.  Overall, I would say performance status is probably ECOG 3.    Medications:  Allergies as of 01/23/2022       Reactions   Nitrofuran Derivatives Other (See Comments)   confusion   Oxycodone Hcl Other (See Comments)   Knocked her out for 6 days   Morphine Other (See Comments)   "Makes me go crazy."    Penicillins Hives   Has patient had a PCN reaction causing immediate rash, facial/tongue/throat swelling, SOB or lightheadedness with hypotension: unknown Has patient had a PCN reaction causing severe rash involving mucus membranes or skin necrosis: unknown Has patient had a PCN reaction that required hospitalization No Has patient had a PCN reaction occurring within the last 10 years: No If all of the above answers are "NO", then may proceed with Cephalosporin use. Other reaction(s): Rash-Generalized   Tape Other (See Comments)   Skin turns red and  burns        Medication List        Accurate as of January 23, 2022  3:38 PM. If you have any questions, ask your nurse or doctor.          albuterol 108 (90 Base) MCG/ACT inhaler Commonly known as: VENTOLIN HFA Inhale 2 puffs into the lungs every 6 (six) hours as needed for wheezing or shortness of breath.   albuterol (2.5 MG/3ML) 0.083% nebulizer solution Commonly known as: PROVENTIL INHALE 1 VIAL BY NEBULIZATION EVERY 4 HOURS AS NEEDED FOR WHEEZING OR SHORTNESS OF BREATH.   ALPRAZolam 0.25 MG tablet Commonly known as: XANAX Take 1 tablet (0.25 mg total) by mouth at bedtime as needed for anxiety.   aspirin EC 81 MG tablet Take 1 tablet (81 mg total) by mouth daily.   bisacodyl 5 MG EC tablet Commonly known as: Dulcolax Take 2 tablets (10 mg total) by mouth at bedtime.   Breztri Aerosphere 160-9-4.8 MCG/ACT Aero Generic drug: Budeson-Glycopyrrol-Formoterol Inhale 2 puffs into the lungs in the morning and at bedtime.   calcium carbonate 500 MG chewable tablet Commonly known as: TUMS - dosed in mg elemental calcium Chew 1 tablet by mouth daily as needed for indigestion or heartburn.   CoQ10 100 MG Caps Take 100 mg by mouth in the morning.  diltiazem 360 MG 24 hr capsule Commonly known as: CARDIZEM CD TAKE 1 CAPSULE BY MOUTH EVERY DAY   [name] As directed   furosemide 20 MG tablet Commonly known as: LASIX TAKE 1 TABLET BY MOUTH EVERY DAY   guaiFENesin 100 MG/5ML liquid Commonly known as: ROBITUSSIN Take 200 mg by mouth 2 (two) times daily as needed for cough.   HYDROcodone-acetaminophen 10-325 MG tablet Commonly known as: NORCO Take 1 tablet by mouth every 6 (six) hours as needed for severe pain. What changed: Another medication with the same name was removed. Continue taking this medication, and follow the directions you see here. Changed by: [name], MD   hydroxypropyl methylcellulose / hypromellose 2.5 % ophthalmic solution Commonly  known as: ISOPTO TEARS / GONIOVISC Place 1 drop into both eyes 3 (three) times daily as needed for dry eyes.   ipratropium-albuterol 0.5-2.5 (3) MG/3ML Soln Commonly known as: DUONEB Take 3 mLs by nebulization every 4 (four) hours as needed.   isosorbide mononitrate 120 MG 24 hr tablet Commonly known as: IMDUR TAKE 1 TABLET BY MOUTH EVERY DAY   ketoconazole 2 % cream Commonly known as: NIZORAL APPLY TO AFFECTED AREA EVERY DAY What changed:  how much to take how to take this when to take this reasons to take this   levothyroxine 150 MCG tablet Commonly known as: SYNTHROID TAKE 1 TABLET BY MOUTH EVERY DAY   naloxegol oxalate 12.5 MG Tabs tablet Commonly known as: MOVANTIK Take 1 tablet (12.5 mg total) by mouth daily.   naproxen sodium 220 MG tablet Commonly known as: ALEVE Take 220 mg by mouth daily as needed (For Headache).   nitroGLYCERIN 0.4 MG SL tablet Commonly known as: NITROSTAT DISSOLVE ONE TABLET UNDER THE TONGUE EVERY 5 MINUTES AS NEEDED FOR CHEST PAIN.  DO NOT EXCEED A TOTAL OF 3 DOSES IN 15 MINUTES   OXYGEN Inhale 2.5 L/min into the lungs at bedtime.   pantoprazole 40 MG tablet Commonly known as: PROTONIX TAKE 1 TABLET BY MOUTH DAILY BEFORE BREAKFAST   PARoxetine 40 MG tablet Commonly known as: PAXIL TAKE 1 TABLET BY MOUTH EVERY DAY   phenytoin 100 MG ER capsule Commonly known as: DILANTIN TAKE 100 MG EVERY MORNING AND 200 MG EVERY NIGHT.   promethazine 25 MG tablet Commonly known as: PHENERGAN Take 1 tablet (25 mg total) by mouth every 8 (eight) hours as needed for nausea or vomiting.   rosuvastatin 20 MG tablet Commonly known as: CRESTOR TAKE 1 TABLET BY MOUTH EVERY DAY   SUMAtriptan 100 MG tablet Commonly known as: IMITREX MAY REPEAT IN 2 HOURS IF HEADACHE PERSISTS OR RECURS.   topiramate 50 MG tablet Commonly known as: Topamax Take 1 tablet (50 mg total) by mouth 2 (two) times daily as needed. Take as needed for headache   traZODone 100  MG tablet Commonly known as: DESYREL TAKE 1 TABLET BY MOUTH EVERYDAY AT BEDTIME   Vitamin D (Ergocalciferol) 1.25 MG (50000 UNIT) Caps capsule Commonly known as: DRISDOL TAKE 1 CAPSULE (50,000 UNITS TOTAL) BY MOUTH EVERY MONDAY.   ZzzQuil 25 MG Caps Generic drug: diphenhydrAMINE HCl (Sleep) Take 50 mg by mouth at bedtime.        Allergies:  Allergies  Allergen Reactions   Nitrofuran Derivatives Other (See Comments)    confusion   Oxycodone Hcl Other (See Comments)    Knocked her out for 6 days   Morphine Other (See Comments)    "Makes me go crazy."  Penicillins Hives    Has patient had a PCN reaction causing immediate rash, facial/tongue/throat swelling, SOB or lightheadedness with hypotension: unknown Has patient had a PCN reaction causing severe rash involving mucus membranes or skin necrosis: unknown Has patient had a PCN reaction that required hospitalization No Has patient had a PCN reaction occurring within the last 10 years: No If all of the above answers are "NO", then may proceed with Cephalosporin use.  Other reaction(s): Rash-Generalized   Tape Other (See Comments)    Skin turns red and burns    Past Medical History, Surgical history, Social history, and Family History were reviewed and updated.  Review of Systems: Review of Systems  Constitutional: Negative.   HENT: Negative.    Eyes: Negative.   Respiratory:  Positive for cough, sputum production, shortness of breath and wheezing.   Cardiovascular:  Positive for palpitations and leg swelling.  Gastrointestinal:  Positive for diarrhea and nausea.  Genitourinary:  Positive for frequency and urgency.  Musculoskeletal:  Positive for joint pain and myalgias.  Skin: Negative.   Neurological:  Positive for weakness.  Endo/Heme/Allergies: Negative.   Psychiatric/Behavioral: Negative.       Physical Exam:  height is 5\' 5"  (1.651 m) and weight is 138 lb 9.6 oz (62.9 kg). Her oral temperature is 98.2 F  (36.8 C). Her blood pressure is 111/41 (abnormal) and her pulse is 85. Her respiration is 19 and oxygen saturation is 93%.   Wt Readings from Last 3 Encounters:  01/23/22 138 lb 9.6 oz (62.9 kg)  12/28/21 139 lb 12.8 oz (63.4 kg)  12/22/21 139 lb 6.4 oz (63.2 kg)    Physical Exam Vitals reviewed.  HENT:     Head: Normocephalic and atraumatic.  Eyes:     Pupils: Pupils are equal, round, and reactive to light.  Cardiovascular:     Rate and Rhythm: Normal rate and regular rhythm.     Heart sounds: Normal heart sounds.  Pulmonary:     Effort: Pulmonary effort is normal.     Breath sounds: Normal breath sounds.     Comments: Pulmonary exam shows wheezes or rhonchi bilaterally.  She has relatively decent air movement. Abdominal:     General: Bowel sounds are normal.     Palpations: Abdomen is soft.  Musculoskeletal:        General: No tenderness or deformity. Normal range of motion.     Cervical back: Normal range of motion.     Comments: Extremities does show some mild edema in the legs bilaterally.  Lymphadenopathy:     Cervical: No cervical adenopathy.  Skin:    General: Skin is warm and dry.     Findings: No erythema or rash.  Neurological:     Mental Status: She is alert and oriented to person, place, and time.  Psychiatric:        Behavior: Behavior normal.        Thought Content: Thought content normal.        Judgment: Judgment normal.     Lab Results  Component Value Date   WBC 5.9 01/23/2022   HGB 10.7 (L) 01/23/2022   HCT 33.6 (L) 01/23/2022   MCV 100.3 (H) 01/23/2022   PLT 239 01/23/2022   Lab Results  Component Value Date   FERRITIN 39 12/22/2021   IRON 65 12/22/2021   TIBC 253 12/22/2021   UIBC 188 12/22/2021   IRONPCTSAT 26 12/22/2021   Lab Results  Component Value Date   RETICCTPCT 1.8  11/18/2021   RBC 3.35 (L) 01/23/2022   No results found for: "KPAFRELGTCHN", "LAMBDASER", "KAPLAMBRATIO" No results found for: "IGGSERUM", "IGA",  "IGMSERUM" No results found for: "TOTALPROTELP", "ALBUMINELP", "A1GS", "A2GS", "BETS", "BETA2SER", "GAMS", "MSPIKE", "SPEI"   Chemistry      Component Value Date/Time   NA 142 12/22/2021 1510   NA 144 07/08/2018 1059   K 4.4 12/22/2021 1510   CL 105 12/22/2021 1510   CO2 30 12/22/2021 1510   BUN 15 12/22/2021 1510   BUN 12 07/08/2018 1059   CREATININE 1.00 12/22/2021 1510      Component Value Date/Time   CALCIUM 9.6 12/22/2021 1510   ALKPHOS 132 (H) 12/22/2021 1510   AST 18 12/22/2021 1510   ALT 19 12/22/2021 1510   BILITOT 0.2 (L) 12/22/2021 1510       Impression and Plan: Ms. Boutwell is a very pleasant 66 yo caucasian female with clinical bronchogenic carcinoma and iron deficiency anemia.   She completed SBRT in May 2021.   She is getting external beam radiation to the right lung.  Hopefully, she will make it through her treatments.  She is getting 10 treatments.  Hopefully this will not affect her underlying COPD.  I know that Radiation Oncology is very cautious when it comes to patients who have underlying lung disease and getting radiation.  I would like to see her back in about 2 or 3 weeks.  I noted that her hemoglobin is a little bit lower.  We will see what her iron studies show.   [name], MD 7/31/20233:38 PM

## 2022-01-24 ENCOUNTER — Ambulatory Visit
Admission: RE | Admit: 2022-01-24 | Discharge: 2022-01-24 | Disposition: A | Discharge status: Home / Self Care | Payer: Medicare HMO | Source: Ambulatory Visit | Attending: Radiation Oncology | Admitting: Radiation Oncology

## 2022-01-24 ENCOUNTER — Ambulatory Visit: Payer: Medicare HMO

## 2022-01-24 ENCOUNTER — Other Ambulatory Visit: Payer: Self-pay

## 2022-01-24 DIAGNOSIS — Z510 Encounter for antineoplastic radiation therapy: Secondary | ICD-10-CM | POA: Diagnosis not present

## 2022-01-24 DIAGNOSIS — R69 Illness, unspecified: Secondary | ICD-10-CM | POA: Diagnosis not present

## 2022-01-24 DIAGNOSIS — C3431 Malignant neoplasm of lower lobe, right bronchus or lung: Secondary | ICD-10-CM | POA: Diagnosis not present

## 2022-01-24 LAB — IRON AND IRON BINDING CAPACITY (CC-WL,HP ONLY)
Iron: 50 ug/dL (ref 28–170)
Saturation Ratios: 23 % (ref 10.4–31.8)
TIBC: 217 ug/dL — ABNORMAL LOW (ref 250–450)
UIBC: 167 ug/dL (ref 148–442)

## 2022-01-24 LAB — RAD ONC ARIA SESSION SUMMARY
Course Elapsed Days: 7
Plan Fractions Treated to Date: 6
Plan Prescribed Dose Per Fraction: 5 Gy
Plan Total Fractions Prescribed: 10
Plan Total Prescribed Dose: 50 Gy
Reference Point Dosage Given to Date: 30 Gy
Reference Point Session Dosage Given: 5 Gy
Session Number: 6

## 2022-01-24 NOTE — Chronic Care Management (AMB) (Signed)
  Chronic Care Management Note  01/24/2022 Name: NANCYLEE GAINES MRN: [id number] DOB: 11-02-1955  [name] is a 66 y.o. year old female who is a primary care patient of Plotnikov, [name], MD and is actively engaged with the care management team. I reached out to [name] by phone today to assist with re-scheduling a follow up visit with the RN Case Manager  Follow up plan: Telephone appointment with care management team member scheduled for:01/31/22  Lochlyn Zullo  Care Coordination Care Guide  Direct Dial: [phone]

## 2022-01-25 ENCOUNTER — Ambulatory Visit
Admission: RE | Admit: 2022-01-25 | Discharge: 2022-01-25 | Disposition: A | Discharge status: Home / Self Care | Payer: Medicare HMO | Source: Ambulatory Visit | Attending: Radiation Oncology | Admitting: Radiation Oncology

## 2022-01-25 ENCOUNTER — Other Ambulatory Visit: Payer: Self-pay

## 2022-01-25 DIAGNOSIS — R69 Illness, unspecified: Secondary | ICD-10-CM | POA: Diagnosis not present

## 2022-01-25 DIAGNOSIS — C3431 Malignant neoplasm of lower lobe, right bronchus or lung: Secondary | ICD-10-CM | POA: Diagnosis not present

## 2022-01-25 DIAGNOSIS — Z510 Encounter for antineoplastic radiation therapy: Secondary | ICD-10-CM | POA: Diagnosis not present

## 2022-01-25 LAB — RAD ONC ARIA SESSION SUMMARY
Course Elapsed Days: 8
Plan Fractions Treated to Date: 7
Plan Prescribed Dose Per Fraction: 5 Gy
Plan Total Fractions Prescribed: 10
Plan Total Prescribed Dose: 50 Gy
Reference Point Dosage Given to Date: 35 Gy
Reference Point Session Dosage Given: 5 Gy
Session Number: 7

## 2022-01-26 ENCOUNTER — Ambulatory Visit
Admission: RE | Admit: 2022-01-26 | Discharge: 2022-01-26 | Disposition: A | Discharge status: Home / Self Care | Payer: Medicare HMO | Source: Ambulatory Visit | Attending: Radiation Oncology | Admitting: Radiation Oncology

## 2022-01-26 ENCOUNTER — Other Ambulatory Visit: Payer: Self-pay

## 2022-01-26 DIAGNOSIS — Z510 Encounter for antineoplastic radiation therapy: Secondary | ICD-10-CM | POA: Diagnosis not present

## 2022-01-26 DIAGNOSIS — C3431 Malignant neoplasm of lower lobe, right bronchus or lung: Secondary | ICD-10-CM | POA: Diagnosis not present

## 2022-01-26 DIAGNOSIS — R69 Illness, unspecified: Secondary | ICD-10-CM | POA: Diagnosis not present

## 2022-01-26 LAB — RAD ONC ARIA SESSION SUMMARY
Course Elapsed Days: 9
Plan Fractions Treated to Date: 8
Plan Prescribed Dose Per Fraction: 5 Gy
Plan Total Fractions Prescribed: 10
Plan Total Prescribed Dose: 50 Gy
Reference Point Dosage Given to Date: 40 Gy
Reference Point Session Dosage Given: 5 Gy
Session Number: 8

## 2022-01-27 ENCOUNTER — Ambulatory Visit
Admission: RE | Admit: 2022-01-27 | Discharge: 2022-01-27 | Disposition: A | Discharge status: Home / Self Care | Payer: Medicare HMO | Source: Ambulatory Visit | Attending: Radiation Oncology | Admitting: Radiation Oncology

## 2022-01-27 ENCOUNTER — Other Ambulatory Visit: Payer: Self-pay

## 2022-01-27 DIAGNOSIS — Z510 Encounter for antineoplastic radiation therapy: Secondary | ICD-10-CM | POA: Diagnosis not present

## 2022-01-27 DIAGNOSIS — M25562 Pain in left knee: Secondary | ICD-10-CM | POA: Diagnosis not present

## 2022-01-27 DIAGNOSIS — R69 Illness, unspecified: Secondary | ICD-10-CM | POA: Diagnosis not present

## 2022-01-27 DIAGNOSIS — C3431 Malignant neoplasm of lower lobe, right bronchus or lung: Secondary | ICD-10-CM | POA: Diagnosis not present

## 2022-01-27 LAB — RAD ONC ARIA SESSION SUMMARY
Course Elapsed Days: 10
Plan Fractions Treated to Date: 9
Plan Prescribed Dose Per Fraction: 5 Gy
Plan Total Fractions Prescribed: 10
Plan Total Prescribed Dose: 50 Gy
Reference Point Dosage Given to Date: 45 Gy
Reference Point Session Dosage Given: 5 Gy
Session Number: 9

## 2022-01-30 ENCOUNTER — Other Ambulatory Visit: Payer: Self-pay

## 2022-01-30 ENCOUNTER — Ambulatory Visit
Admission: RE | Admit: 2022-01-30 | Discharge: 2022-01-30 | Disposition: A | Discharge status: Home / Self Care | Payer: Medicare HMO | Source: Ambulatory Visit | Attending: Radiation Oncology | Admitting: Radiation Oncology

## 2022-01-30 DIAGNOSIS — C3431 Malignant neoplasm of lower lobe, right bronchus or lung: Secondary | ICD-10-CM | POA: Diagnosis not present

## 2022-01-30 DIAGNOSIS — R69 Illness, unspecified: Secondary | ICD-10-CM | POA: Diagnosis not present

## 2022-01-30 DIAGNOSIS — Z510 Encounter for antineoplastic radiation therapy: Secondary | ICD-10-CM | POA: Diagnosis not present

## 2022-01-30 LAB — RAD ONC ARIA SESSION SUMMARY
Course Elapsed Days: 13
Plan Fractions Treated to Date: 10
Plan Prescribed Dose Per Fraction: 5 Gy
Plan Total Fractions Prescribed: 10
Plan Total Prescribed Dose: 50 Gy
Reference Point Dosage Given to Date: 50 Gy
Reference Point Session Dosage Given: 5 Gy
Session Number: 10

## 2022-01-31 ENCOUNTER — Ambulatory Visit: Payer: Medicare HMO | Admitting: *Deleted

## 2022-01-31 DIAGNOSIS — J449 Chronic obstructive pulmonary disease, unspecified: Secondary | ICD-10-CM

## 2022-01-31 DIAGNOSIS — C3490 Malignant neoplasm of unspecified part of unspecified bronchus or lung: Secondary | ICD-10-CM
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
## 2022-02-10 ENCOUNTER — Inpatient Hospital Stay: Payer: Medicare HMO | Admitting: Hematology & Oncology

## 2022-02-10 ENCOUNTER — Other Ambulatory Visit: Payer: Self-pay

## 2022-02-10 ENCOUNTER — Inpatient Hospital Stay: Payer: Medicare HMO | Attending: Hematology & Oncology

## 2022-02-10 ENCOUNTER — Encounter: Payer: Self-pay | Admitting: Hematology & Oncology

## 2022-02-10 VITALS — BP 124/33 | HR 54 | Temp 98.1°F | Resp 19 | Wt 130.0 lb

## 2022-02-10 DIAGNOSIS — Z79899 Other long term (current) drug therapy: Secondary | ICD-10-CM | POA: Insufficient documentation

## 2022-02-10 DIAGNOSIS — C3412 Malignant neoplasm of upper lobe, left bronchus or lung: Secondary | ICD-10-CM | POA: Diagnosis not present

## 2022-02-10 DIAGNOSIS — D509 Iron deficiency anemia, unspecified: Secondary | ICD-10-CM | POA: Insufficient documentation

## 2022-02-10 DIAGNOSIS — D500 Iron deficiency anemia secondary to blood loss (chronic): Secondary | ICD-10-CM

## 2022-02-10 DIAGNOSIS — R911 Solitary pulmonary nodule: Secondary | ICD-10-CM | POA: Diagnosis not present

## 2022-02-10 DIAGNOSIS — C3490 Malignant neoplasm of unspecified part of unspecified bronchus or lung: Secondary | ICD-10-CM | POA: Diagnosis not present

## 2022-02-10 LAB — CMP (CANCER CENTER ONLY)
ALT: 7 U/L (ref 0–44)
AST: 12 U/L — ABNORMAL LOW (ref 15–41)
Albumin: 3.9 g/dL (ref 3.5–5.0)
Alkaline Phosphatase: 135 U/L — ABNORMAL HIGH (ref 38–126)
Anion gap: 7 (ref 5–15)
BUN: 12 mg/dL (ref 8–23)
CO2: 30 mmol/L (ref 22–32)
Calcium: 9.7 mg/dL (ref 8.9–10.3)
Chloride: 101 mmol/L (ref 98–111)
Creatinine: 0.98 mg/dL (ref 0.44–1.00)
GFR, Estimated: >60 mL/min (ref >60)
Glucose, Bld: 100 mg/dL — ABNORMAL HIGH (ref 70–99)
Potassium: 3.7 mmol/L (ref 3.5–5.1)
Sodium: 138 mmol/L (ref 135–145)
Total Bilirubin: 0.2 mg/dL — ABNORMAL LOW (ref 0.3–1.2)
Total Protein: 7.3 g/dL (ref 6.5–8.1)

## 2022-02-10 LAB — CBC WITH DIFFERENTIAL (CANCER CENTER ONLY)
Abs Immature Granulocytes: 0.08 10*3/uL — ABNORMAL HIGH (ref 0.00–0.07)
Basophils Absolute: 0.1 10*3/uL (ref 0.0–0.1)
Basophils Relative: 1 %
Eosinophils Absolute: 0.1 10*3/uL (ref 0.0–0.5)
Eosinophils Relative: 2 %
HCT: 36 % (ref 36.0–46.0)
Hemoglobin: 11.6 g/dL — ABNORMAL LOW (ref 12.0–15.0)
Immature Granulocytes: 1 %
Lymphocytes Relative: 12 %
Lymphs Abs: 0.7 10*3/uL (ref 0.7–4.0)
MCH: 31.7 pg (ref 26.0–34.0)
MCHC: 32.2 g/dL (ref 30.0–36.0)
MCV: 98.4 fL (ref 80.0–100.0)
Monocytes Absolute: 0.5 10*3/uL (ref 0.1–1.0)
Monocytes Relative: 8 %
Neutro Abs: 4.6 10*3/uL (ref 1.7–7.7)
Neutrophils Relative %: 76 %
Platelet Count: 237 10*3/uL (ref 150–400)
RBC: 3.66 MIL/uL — ABNORMAL LOW (ref 3.87–5.11)
RDW: 13.4 % (ref 11.5–15.5)
WBC Count: 6 10*3/uL (ref 4.0–10.5)
nRBC: 0 % (ref 0.0–0.2)

## 2022-02-10 LAB — RETICULOCYTES
Immature Retic Fract: 10.7 % (ref 2.3–15.9)
RBC.: 3.72 MIL/uL — ABNORMAL LOW (ref 3.87–5.11)
Retic Count, Absolute: 46.1 10*3/uL (ref 19.0–186.0)
Retic Ct Pct: 1.2 % (ref 0.4–3.1)

## 2022-02-10 LAB — FERRITIN: Ferritin: 61 ng/mL (ref 11–307)

## 2022-02-10 LAB — PREALBUMIN: Prealbumin: 15 mg/dL — ABNORMAL LOW (ref 18–38)

## 2022-02-10 NOTE — Progress Notes (Signed)
Hematology and Oncology Follow Up Visit  Kaylee Hansen [id number] 25-Jan-1956 66 y.o. 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  I must say that this is probably the best I have ever seen Kaylee Hansen look.  Kaylee Hansen completed radiation therapy about 12 days ago.  I am just so happy that Kaylee Hansen is looking somewhat better.  Kaylee Hansen does not look like Kaylee Hansen is having any trouble breathing.  Kaylee Hansen does not sound congested.  Kaylee Hansen appetite is doing okay.  Hopefully, Kaylee Hansen is eating a little bit more.  Kaylee Hansen is having no nausea or vomiting.  There is no change in bowel or bladder habits.  Kaylee Hansen has had no bleeding.  Kaylee Hansen has had no rashes.  There is been no leg swelling.  When we last saw Kaylee Hansen back in July, Kaylee Hansen ferritin was 38 with an iron saturation of 23%.  Currently, I would have said that Kaylee Hansen performance status is probably ECOG 2.    Medications:  Allergies as of 02/10/2022       Reactions   Nitrofuran Derivatives Other (See Comments)   confusion   Oxycodone Hcl Other (See Comments)   Knocked Kaylee Hansen out for 6 days   Morphine Other (See Comments)   "Makes me go crazy."    Penicillins Hives   Has patient had a PCN reaction causing immediate rash, facial/tongue/throat swelling, SOB or lightheadedness with hypotension: unknown Has patient had a PCN reaction causing severe rash involving mucus membranes or skin necrosis: unknown Has patient had a PCN reaction that required hospitalization No Has patient had a PCN reaction occurring within the last 10 years: No If all of the above answers are "NO", then may proceed with Cephalosporin use. Other reaction(s): Rash-Generalized   Tape Other (See  Comments)   Skin turns red and burns        Medication List        Accurate as of February 10, 2022  4:04 PM. If you have any questions, ask your nurse or doctor.          albuterol 108 (90 Base) MCG/ACT inhaler Commonly known as: VENTOLIN HFA Inhale 2 puffs into the lungs every 6 (six) hours as needed for wheezing or shortness of breath.   albuterol (2.5 MG/3ML) 0.083% nebulizer solution Commonly known as: PROVENTIL INHALE 1 VIAL BY NEBULIZATION EVERY 4 HOURS AS NEEDED FOR WHEEZING OR SHORTNESS OF BREATH.   ALPRAZolam 0.25 MG tablet Commonly known as: XANAX Take 1 tablet (0.25 mg total) by mouth at bedtime as needed for anxiety.   aspirin EC 81 MG tablet Take 1 tablet (81 mg total) by mouth daily.   bisacodyl 5 MG EC tablet Commonly known as: Dulcolax Take 2 tablets (10 mg total) by mouth at bedtime.   Breztri Aerosphere 160-9-4.8 MCG/ACT Aero Generic drug: Budeson-Glycopyrrol-Formoterol Inhale 2 puffs into the lungs in the morning and at bedtime.   calcium carbonate 500 MG chewable tablet Commonly known as: TUMS - dosed in mg elemental calcium Chew 1 tablet by mouth daily as needed for indigestion or heartburn.   CoQ10 100 MG Caps Take 100 mg by mouth in the  morning.   diltiazem 360 MG 24 hr capsule Commonly known as: CARDIZEM CD TAKE 1 CAPSULE BY MOUTH EVERY DAY   [name] As directed   furosemide 20 MG tablet Commonly known as: LASIX TAKE 1 TABLET BY MOUTH EVERY DAY   guaiFENesin 100 MG/5ML liquid Commonly known as: ROBITUSSIN Take 200 mg by mouth 2 (two) times daily as needed for cough.   HYDROcodone-acetaminophen 10-325 MG tablet Commonly known as: NORCO Take 1 tablet by mouth every 6 (six) hours as needed for severe pain.   hydroxypropyl methylcellulose / hypromellose 2.5 % ophthalmic solution Commonly known as: ISOPTO TEARS / GONIOVISC Place 1 drop into both eyes 3 (three) times daily as needed for dry eyes.   ipratropium-albuterol  0.5-2.5 (3) MG/3ML Soln Commonly known as: DUONEB Take 3 mLs by nebulization every 4 (four) hours as needed.   isosorbide mononitrate 120 MG 24 hr tablet Commonly known as: IMDUR TAKE 1 TABLET BY MOUTH EVERY DAY   ketoconazole 2 % cream Commonly known as: NIZORAL APPLY TO AFFECTED AREA EVERY DAY What changed:  how much to take how to take this when to take this reasons to take this   levothyroxine 150 MCG tablet Commonly known as: SYNTHROID TAKE 1 TABLET BY MOUTH EVERY DAY   naloxegol oxalate 12.5 MG Tabs tablet Commonly known as: MOVANTIK Take 1 tablet (12.5 mg total) by mouth daily.   naproxen sodium 220 MG tablet Commonly known as: ALEVE Take 220 mg by mouth daily as needed (For Headache).   nitroGLYCERIN 0.4 MG SL tablet Commonly known as: NITROSTAT DISSOLVE ONE TABLET UNDER THE TONGUE EVERY 5 MINUTES AS NEEDED FOR CHEST PAIN.  DO NOT EXCEED A TOTAL OF 3 DOSES IN 15 MINUTES   OXYGEN Inhale 2.5 L/min into the lungs at bedtime.   pantoprazole 40 MG tablet Commonly known as: PROTONIX TAKE 1 TABLET BY MOUTH DAILY BEFORE BREAKFAST   PARoxetine 40 MG tablet Commonly known as: PAXIL TAKE 1 TABLET BY MOUTH EVERY DAY   phenytoin 100 MG ER capsule Commonly known as: DILANTIN TAKE 100 MG EVERY MORNING AND 200 MG EVERY NIGHT.   promethazine 25 MG tablet Commonly known as: PHENERGAN Take 1 tablet (25 mg total) by mouth every 8 (eight) hours as needed for nausea or vomiting.   rosuvastatin 20 MG tablet Commonly known as: CRESTOR TAKE 1 TABLET BY MOUTH EVERY DAY   SUMAtriptan 100 MG tablet Commonly known as: IMITREX MAY REPEAT IN 2 HOURS IF HEADACHE PERSISTS OR RECURS.   topiramate 50 MG tablet Commonly known as: Topamax Take 1 tablet (50 mg total) by mouth 2 (two) times daily as needed. Take as needed for headache   traZODone 100 MG tablet Commonly known as: DESYREL TAKE 1 TABLET BY MOUTH EVERYDAY AT BEDTIME   Vitamin D (Ergocalciferol) 1.25 MG (50000 UNIT)  Caps capsule Commonly known as: DRISDOL TAKE 1 CAPSULE (50,000 UNITS TOTAL) BY MOUTH EVERY MONDAY.   ZzzQuil 25 MG Caps Generic drug: diphenhydrAMINE HCl (Sleep) Take 50 mg by mouth at bedtime.        Allergies:  Allergies  Allergen Reactions   Nitrofuran Derivatives Other (See Comments)    confusion   Oxycodone Hcl Other (See Comments)    Knocked Kaylee Hansen out for 6 days   Morphine Other (See Comments)    "Makes me go crazy."    Penicillins Hives    Has patient had a PCN reaction causing immediate rash, facial/tongue/throat swelling, SOB or lightheadedness with hypotension: unknown Has patient  had a PCN reaction causing severe rash involving mucus membranes or skin necrosis: unknown Has patient had a PCN reaction that required hospitalization No Has patient had a PCN reaction occurring within the last 10 years: No If all of the above answers are "NO", then may proceed with Cephalosporin use.  Other reaction(s): Rash-Generalized   Tape Other (See Comments)    Skin turns red and burns    Past Medical History, Surgical history, Social history, and Family History were reviewed and updated.  Review of Systems: Review of Systems  Constitutional: Negative.   HENT: Negative.    Eyes: Negative.   Respiratory:  Positive for cough, sputum production, shortness of breath and wheezing.   Cardiovascular:  Positive for palpitations and leg swelling.  Gastrointestinal:  Positive for diarrhea and nausea.  Genitourinary:  Positive for frequency and urgency.  Musculoskeletal:  Positive for joint pain and myalgias.  Skin: Negative.   Neurological:  Positive for weakness.  Endo/Heme/Allergies: Negative.   Psychiatric/Behavioral: Negative.       Physical Exam:  weight is 130 lb (59 kg). Kaylee Hansen oral temperature is 98.1 F (36.7 C). Kaylee Hansen blood pressure is 124/33 (abnormal) and Kaylee Hansen pulse is 54 (abnormal). Kaylee Hansen respiration is 19 and oxygen saturation is 98%.   Wt Readings from Last 3 Encounters:   02/10/22 130 lb (59 kg)  01/23/22 138 lb 9.6 oz (62.9 kg)  12/28/21 139 lb 12.8 oz (63.4 kg)    Physical Exam Vitals reviewed.  HENT:     Head: Normocephalic and atraumatic.  Eyes:     Pupils: Pupils are equal, round, and reactive to light.  Cardiovascular:     Rate and Rhythm: Normal rate and regular rhythm.     Heart sounds: Normal heart sounds.  Pulmonary:     Effort: Pulmonary effort is normal.     Breath sounds: Normal breath sounds.     Comments: Pulmonary exam shows wheezes or rhonchi bilaterally.  Kaylee Hansen has relatively decent air movement. Abdominal:     General: Bowel sounds are normal.     Palpations: Abdomen is soft.  Musculoskeletal:        General: No tenderness or deformity. Normal range of motion.     Cervical back: Normal range of motion.     Comments: Extremities does show some mild edema in the legs bilaterally.  Lymphadenopathy:     Cervical: No cervical adenopathy.  Skin:    General: Skin is warm and dry.     Findings: No erythema or rash.  Neurological:     Mental Status: Kaylee Hansen is alert and oriented to person, place, and time.  Psychiatric:        Behavior: Behavior normal.        Thought Content: Thought content normal.        Judgment: Judgment normal.      Lab Results  Component Value Date   WBC 6.0 02/10/2022   HGB 11.6 (L) 02/10/2022   HCT 36.0 02/10/2022   MCV 98.4 02/10/2022   PLT 237 02/10/2022   Lab Results  Component Value Date   FERRITIN 38 01/23/2022   IRON 50 01/23/2022   TIBC 217 (L) 01/23/2022   UIBC 167 01/23/2022   IRONPCTSAT 23 01/23/2022   Lab Results  Component Value Date   RETICCTPCT 1.2 02/10/2022   RBC 3.66 (L) 02/10/2022   RBC 3.72 (L) 02/10/2022   No results found for: "KPAFRELGTCHN", "LAMBDASER", "KAPLAMBRATIO" No results found for: "IGGSERUM", "IGA", "IGMSERUM" No results found for: "TOTALPROTELP", "  ALBUMINELP", "A1GS", "A2GS", "BETS", "BETA2SER", "GAMS", "MSPIKE", "SPEI"   Chemistry      Component Value  Date/Time   NA 138 02/10/2022 1519   NA 144 07/08/2018 1059   K 3.7 02/10/2022 1519   CL 101 02/10/2022 1519   CO2 30 02/10/2022 1519   BUN 12 02/10/2022 1519   BUN 12 07/08/2018 1059   CREATININE 0.98 02/10/2022 1519      Component Value Date/Time   CALCIUM 9.7 02/10/2022 1519   ALKPHOS 135 (H) 02/10/2022 1519   AST 12 (L) 02/10/2022 1519   ALT 7 02/10/2022 1519   BILITOT 0.2 (L) 02/10/2022 1519       Impression and Plan: Kaylee Hansen is a very pleasant 66 yo caucasian female with clinical bronchogenic carcinoma and iron deficiency anemia.   Kaylee Hansen completed SBRT in May 2021.   Kaylee Hansen completed external beam radiation therapy to the right lung.  Kaylee Hansen completed this back on 01/30/2022.  We still need to wait a good 2 weeks or more before we do any scans to see how the radiation helped.  We did still add to follow Kaylee Hansen iron studies.  We will see what the iron studies are when we get them back.  I would like to get Kaylee Hansen back in about a month or so just to follow-up with Kaylee Hansen iron.  We will see how Kaylee Hansen lungs are doing.  I am just happy that Kaylee Hansen quality of life seems to be doing better right now.  [name], MD 8/18/20234:04 PM

## 2022-02-13 ENCOUNTER — Ambulatory Visit (INDEPENDENT_AMBULATORY_CARE_PROVIDER_SITE_OTHER): Payer: Medicare HMO

## 2022-02-13 DIAGNOSIS — Z1239 Encounter for other screening for malignant neoplasm of breast: Secondary | ICD-10-CM

## 2022-02-13 DIAGNOSIS — Z0000 Encounter for general adult medical examination without abnormal findings: Secondary | ICD-10-CM

## 2022-02-13 LAB — IRON AND IRON BINDING CAPACITY (CC-WL,HP ONLY)
Iron: 43 ug/dL (ref 28–170)
Saturation Ratios: 20 % (ref 10.4–31.8)
TIBC: 214 ug/dL — ABNORMAL LOW (ref 250–450)
UIBC: 171 ug/dL

## 2022-02-13 NOTE — Progress Notes (Cosign Needed Addendum)
I connected with Chosen Catalano today by telephone and verified that I am speaking with the correct person using two identifiers. Location patient: home Location provider: work Persons participating in the virtual visit: patient, provider.   I discussed the limitations, risks, security and privacy concerns of performing an evaluation and management service by telephone and the availability of in person appointments. I also discussed with the patient that there may be a patient responsible charge related to this service. The patient expressed understanding and verbally consented to this telephonic visit.    Interactive audio and video telecommunications were attempted between this provider and patient, however failed, due to patient having technical difficulties OR patient did not have access to video capability.  We continued and completed visit with audio only.  Some vital signs may be absent or patient reported.   Time Spent with patient on telephone encounter: 30 minutes  Subjective:   Kaylee Hansen is a 66 y.o. female who presents for Medicare Annual (Subsequent) preventive examination.  Review of Systems     Cardiac Risk Factors include: advanced age (>58men, >73 women);dyslipidemia;family history of premature cardiovascular disease;hypertension;smoking/ tobacco exposure     Objective:    Today's Vitals   02/13/22 1404  PainSc: 0-No pain   There is no height or weight on file to calculate BMI.     02/10/2022    3:50 PM 01/23/2022    3:24 PM 12/28/2021    3:05 PM 12/22/2021    3:32 PM 11/18/2021    3:29 PM 10/26/2021   10:09 AM 10/17/2021    3:41 PM  Advanced Directives  Does Patient Have a Medical Advance Directive? No No No No No No No  Would patient like information on creating a medical advance directive? No - Patient declined No - Patient declined  No - Patient declined No - Patient declined No - Patient declined No - Patient declined    Current Medications  (verified) Outpatient Encounter Medications as of 02/13/2022  Medication Sig   albuterol (PROVENTIL HFA;VENTOLIN HFA) 108 (90 Base) MCG/ACT inhaler Inhale 2 puffs into the lungs every 6 (six) hours as needed for wheezing or shortness of breath.   albuterol (PROVENTIL) (2.5 MG/3ML) 0.083% nebulizer solution INHALE 1 VIAL BY NEBULIZATION EVERY 4 HOURS AS NEEDED FOR WHEEZING OR SHORTNESS OF BREATH.   ALPRAZolam (XANAX) 0.25 MG tablet Take 1 tablet (0.25 mg total) by mouth at bedtime as needed for anxiety.   aspirin EC 81 MG tablet Take 1 tablet (81 mg total) by mouth daily.   bisacodyl (DULCOLAX) 5 MG EC tablet Take 2 tablets (10 mg total) by mouth at bedtime.   Budeson-Glycopyrrol-Formoterol (BREZTRI AEROSPHERE) 160-9-4.8 MCG/ACT AERO Inhale 2 puffs into the lungs in the morning and at bedtime.   calcium carbonate (TUMS - DOSED IN MG ELEMENTAL CALCIUM) 500 MG chewable tablet Chew 1 tablet by mouth daily as needed for indigestion or heartburn.   Coenzyme Q10 (COQ10) 100 MG CAPS Take 100 mg by mouth in the morning.   diltiazem (CARDIZEM CD) 360 MG 24 hr capsule TAKE 1 CAPSULE BY MOUTH EVERY DAY   diphenhydrAMINE HCl, Sleep, (ZZZQUIL) 25 MG CAPS Take 50 mg by mouth at bedtime.   furosemide (LASIX) 20 MG tablet TAKE 1 TABLET BY MOUTH EVERY DAY   guaiFENesin (ROBITUSSIN) 100 MG/5ML liquid Take 200 mg by mouth 2 (two) times daily as needed for cough.   HYDROcodone-acetaminophen (NORCO) 10-325 MG tablet Take 1 tablet by mouth every 6 (six) hours as needed  for severe pain.   hydroxypropyl methylcellulose / hypromellose (ISOPTO TEARS / GONIOVISC) 2.5 % ophthalmic solution Place 1 drop into both eyes 3 (three) times daily as needed for dry eyes.   ipratropium-albuterol (DUONEB) 0.5-2.5 (3) MG/3ML SOLN Take 3 mLs by nebulization every 4 (four) hours as needed.   isosorbide mononitrate (IMDUR) 120 MG 24 hr tablet TAKE 1 TABLET BY MOUTH EVERY DAY   ketoconazole (NIZORAL) 2 % cream APPLY TO AFFECTED AREA EVERY DAY  (Patient taking differently: Apply 1 application  topically daily as needed for irritation. APPLY TO AFFECTED AREA EVERY DAY)   levothyroxine (SYNTHROID) 150 MCG tablet TAKE 1 TABLET BY MOUTH EVERY DAY   naloxegol oxalate (MOVANTIK) 12.5 MG TABS tablet Take 1 tablet (12.5 mg total) by mouth daily.   naproxen sodium (ALEVE) 220 MG tablet Take 220 mg by mouth daily as needed (For Headache).   nitroGLYCERIN (NITROSTAT) 0.4 MG SL tablet DISSOLVE ONE TABLET UNDER THE TONGUE EVERY 5 MINUTES AS NEEDED FOR CHEST PAIN.  DO NOT EXCEED A TOTAL OF 3 DOSES IN 15 MINUTES   OXYGEN Inhale 2.5 L/min into the lungs at bedtime.   pantoprazole (PROTONIX) 40 MG tablet TAKE 1 TABLET BY MOUTH DAILY BEFORE BREAKFAST   PARoxetine (PAXIL) 40 MG tablet TAKE 1 TABLET BY MOUTH EVERY DAY   phenytoin (DILANTIN) 100 MG ER capsule TAKE 100 MG EVERY MORNING AND 200 MG EVERY NIGHT.   promethazine (PHENERGAN) 25 MG tablet Take 1 tablet (25 mg total) by mouth every 8 (eight) hours as needed for nausea or vomiting.   Respiratory Therapy Supplies (FLUTTER) DEVI As directed (Patient not taking: Reported on 12/28/2021)   rosuvastatin (CRESTOR) 20 MG tablet TAKE 1 TABLET BY MOUTH EVERY DAY   SUMAtriptan (IMITREX) 100 MG tablet MAY REPEAT IN 2 HOURS IF HEADACHE PERSISTS OR RECURS.   topiramate (TOPAMAX) 50 MG tablet Take 1 tablet (50 mg total) by mouth 2 (two) times daily as needed. Take as needed for headache   traZODone (DESYREL) 100 MG tablet TAKE 1 TABLET BY MOUTH EVERYDAY AT BEDTIME   Vitamin D, Ergocalciferol, (DRISDOL) 1.25 MG (50000 UNIT) CAPS capsule TAKE 1 CAPSULE (50,000 UNITS TOTAL) BY MOUTH EVERY MONDAY.   Facility-Administered Encounter Medications as of 02/13/2022  Medication   promethazine (PHENERGAN) injection 25 mg    Allergies (verified) Nitrofuran derivatives, Oxycodone hcl, Morphine, Penicillins, and Tape   History: Past Medical History:  Diagnosis Date   Acute respiratory failure following trauma and surgery  ([place]) 08/26/2012   Anxiety    Aortic insufficiency with aortic stenosis 04/2017   TTE December 2019: Mild AS with severe regurgitation.  Mild LA dilation.;;  TEE January 2016: Severe-type III aortic regurgitation (holodiastolic flow reversal in the a sending aorta & vena contracta =6.    CAD S/P percutaneous coronary angioplasty 11/2017   Proximal RCA PCI Synergy DES 3.5 mm x 16 mm (3.8 mm). Ost-mLM 30%. Ost-prox Cx 40%.; f/u Cath 03/2021: widely patent RCA stent. Ost-prox LCx 30%. Normal LAD-D2.   Cancer [facility])    Collagenous colitis    Colon adenomas 2011   Constipation    Chronic abdominal pain and constipation   COPD (chronic obstructive pulmonary disease) (HCC)    Depression    Diverticulosis    Esophageal stricture 11/08/2012   Ulcer noted in 2010   GERD (gastroesophageal reflux disease)    Helicobacter pylori gastritis 2010   Pylera Tx   History of cholecystectomy    Hx of appendectomy    Hx  of cancer of lung 1999   Hx of hysterectomy    Hyperlipidemia    Hypothyroidism    Iron deficiency anemia due to chronic blood loss 10/01/2019   Low back pain    Non-STEMI (non-ST elevated myocardial infarction) ([place]) 06/2018   RCA PCI   Osteoarthritis of knee    bilateral knee   Seizures (HCC)    Stroke (HCC)    CVA, hx of 97   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[place])    Past Surgical History:  Procedure Laterality Date   ABDOMINAL HYSTERECTOMY     complete 1992   ABDOMINAL SURGERY     Exploratory   APPENDECTOMY     BALLOON DILATION N/A 11/08/2012   Procedure: BALLOON DILATION;  Surgeon: Gatha Mayer, MD;  Location: [facility] ENDOSCOPY;  Service: Endoscopy;  Laterality: N/A;   CHOLECYSTECTOMY     COLONOSCOPY W/ BIOPSIES     multiple   CORONARY STENT INTERVENTION N/A 06/28/2018   Procedure: CORONARY STENT INTERVENTION;  Surgeon: Martinique, Peter M, MD;  Location: [place] [facility];  Service: Cardiovascular;  Laterality: N/A;  90% prox RCA -> PCI with Synergy DES 3.5 mm x 16 mm (3.8 mm).   CT CTA  CORONARY W/CA SCORE W/CM &/OR WO/CM  05/2017   Coronary calcium score 2.9. Intermediate risk. Unusual noncalcified plaque in RCA --FFR negative   ESOPHAGOGASTRODUODENOSCOPY     w/baloon x 2   ESOPHAGOGASTRODUODENOSCOPY N/A 11/08/2012   Procedure: ESOPHAGOGASTRODUODENOSCOPY (EGD);  Surgeon: Gatha Mayer, MD;  Location: [facility];  Service: Endoscopy;  Laterality: N/A;   ESOPHAGOGASTRODUODENOSCOPY (EGD) WITH PROPOFOL N/A 08/25/2019   Procedure: ESOPHAGOGASTRODUODENOSCOPY (EGD) WITH PROPOFOL;  Surgeon: Gatha Mayer, MD;  Location: [facility] ENDOS[facility];  Service: Endoscopy;  Laterality: N/A;   LEFT HEART CATH AND CORONARY ANGIOGRAPHY N/A 06/28/2018   Procedure: LEFT HEART CATH AND CORONARY ANGIOGRAPHY;  Surgeon: Martinique, Peter M, MD;  Location: [place] [facility];  Service: Cardiovascular:  90% prox RCA -> DES PCI. Ost-mLM 30%. Ost-prox Cx 40%.   EF 55-65%. LVEDP 15 mmHg.    MALONEY DILATION  08/25/2019   Procedure: MALONEY DILATION;  Surgeon: Gatha Mayer, MD;  Location: [facility];  Service: Endoscopy;;   RIGHT HEART CATH N/A 07/11/2018   Procedure: RIGHT HEART CATH;  Surgeon: Leonie Man, MD;  Location: [facility];;   Relatively normal Right Heart Cath pressures: PA pressure 26/14 mmHg-mean 20 mmHg; PCWP 13 mmHg.  RAP 6 mmHg, RVP 28/3 mmHg-EDP 10 mmHg. Severely reduced cardiac output and index by Fick: 3.63 and 2.07.   RIGHT/LEFT HEART CATH AND CORONARY ANGIOGRAPHY N/A 04/01/2021   Procedure: RIGHT/LEFT HEART CATH AND CORONARY ANGIOGRAPHY;  Surgeon: Leonie Man, MD;  Location: [place] [facility];; Very large-dominant RCA with widely patent stent.  LCx ost-prox 30%. Diminutive LAD with very large D2,.RHC: Mildly Reduced CO-CI (4.7-2.64).  LVEDP 20 mmHg.  PAP 27/6 mmHg with PCWP 19 mmHg.   TEE WITHOUT CARDIOVERSION N/A 07/11/2018   Procedure: TRANSESOPHAGEAL ECHOCARDIOGRAM (TEE);  Surgeon: [name], MD;  Location: [facility] ENDOSCOPY;  Service: Cardiology;;  Severe aortic  regurgitation-type III. (suggested by holodiastolic flow reversal and descending aorta-vena contracta 6 mm).   TOTAL HIP ARTHROPLASTY     Left   TRANSTHORACIC ECHOCARDIOGRAM  05/2018   EF 60-65%.  No R WMA.  GR 1 DD.  Mild aortic stenosis with severe regurgitation.  Mild MR..  Only mild LV dilation noted.   TRANSTHORACIC ECHOCARDIOGRAM  12/2018    EF 60-65%.  Mild LVH. Gr1 DD. Mod AoV thickening. Mod-Severe AI, ~ mlid AS.   Family History  Problem Relation Age of Onset   Coronary artery disease Mother    Diabetes Mother    Hypertension Father    Diabetes Son    Coronary artery disease Other        grandmother, grandfather   Kidney disease Other        aunt   Kidney cancer Sister    Colon cancer Neg Hx        colon   Stomach cancer Neg Hx    Esophageal cancer Neg Hx    Pancreatic cancer Neg Hx    Liver disease Neg Hx    Social History   Socioeconomic History   Marital status: Married    Spouse name: Not on file   Number of children: 2   Years of education: Not on file   Highest education level: Not on file  Occupational History   Occupation: disabled    Employer: DISABLED  Tobacco Use   Smoking status: Every Day    Packs/day: 1.00    Years: 48.00    Total pack years: 48.00    Types: Cigarettes   Smokeless tobacco: Never  Vaping Use   Vaping Use: Never used  Substance and Sexual Activity   Alcohol use: No   Drug use: No   Sexual activity: Not Currently  Other Topics Concern   Not on file  Social History Narrative   Married   2 children   No regular exercise         Social Determinants of Health   Financial Resource Strain: Low Risk  (02/13/2022)   Overall Financial Resource Strain (CARDIA)    Difficulty of Paying Living Expenses: Not hard at all  Food Insecurity: No Food Insecurity (01/31/2022)   Hunger Vital Sign    Worried About Running Out of Food in the Last Year: Never true    Ran Out of Food in the Last Year: Never true  Transportation Needs: No  Transportation Needs (01/31/2022)   PRAPARE - Hydrologist (Medical): No    Lack of Transportation (Non-Medical): No  Physical Activity: Inactive (02/13/2022)   Exercise Vital Sign    Days of Exercise per Week: 0 days    Minutes of Exercise per Session: 0 min  Stress: No Stress Concern Present (02/13/2022)   [place]    Feeling of Stress : Not at all  Social Connections: Moderately Isolated (02/13/2022)   Social Connection and Isolation Panel [NHANES]    Frequency of Communication with Friends and Family: Three times a week    Frequency of Social Gatherings with Friends and Family: Once a week    Attends Religious Services: Never    Marine scientist or Organizations: No    Attends Music therapist: Never    Marital Status: Married    Tobacco Counseling Ready to quit: Not Answered Counseling given: Not Answered   Clinical Intake:  Pre-visit preparation completed: Yes  Pain : No/denies pain Pain Score: 0-No pain     Diabetes: No  How often do you need to have someone help you when you read instructions, pamphlets, or other written materials from your doctor or pharmacy?: 1 - Never What is the last grade level you completed in school?: 10TH GRADE  Diabetic? NO  Interpreter Needed?: No  Information entered by :: Suan Pyeatt, LPN.  Activities of Daily Living    02/13/2022    2:18 PM 04/01/2021    8:49 AM  In your present state of health, do you have any difficulty performing the following activities:  Hearing? 0   Vision? 0   Difficulty concentrating or making decisions? 1   Walking or climbing stairs? 1 1  Dressing or bathing? 1   Doing errands, shopping? 1   Preparing Food and eating ? Y   Using the Toilet? Y   In the past six months, have you accidently leaked urine? Y   Do you have problems with loss of bowel control? Y   Managing your  Medications? Y   Managing your Finances? N   Housekeeping or managing your Housekeeping? Y     Patient Care Team: Cassandria Anger, MD as PCP - General Leonie Man, MD as PCP - Cardiology (Cardiology) Gatha Mayer, MD (Gastroenterology) Carolan Clines, MD (Inactive) as Attending Physician (Urology) Erline Levine, MD as Consulting Physician (Neurosurgery) Sz[place], Darnelle Maffucci, [facility] (Inactive) as Pharmacist (Pharmacist)  Indicate any recent Medical Services you may have received from other than [facility] providers in the past year (date may be approximate).     Assessment:   This is a routine wellness examination for Kyrene.  Hearing/Vision screen Hearing Screening - Comments:: Patient denied any hearing difficulty.   No hearing aids.   Vision Screening - Comments:: Patient does wear corrective lenses/contacts.  Eye exam done by: Madilyn Hook, OD    Dietary issues and exercise activities discussed: Current Exercise Habits: The patient does not participate in regular exercise at present, Exercise limited by: orthopedic condition(s);respiratory conditions(s);neurologic condition(s)   Goals Addressed             This Visit's Progress    NONE AT THIS TIME.        Depression Screen    02/13/2022    2:17 PM 12/08/2021    1:54 PM 01/24/2021   11:30 AM 01/28/2020   11:49 AM 10/08/2019    3:13 PM 04/04/2017   11:07 AM 10/03/2016    9:35 AM  PHQ 2/9 Scores  PHQ - 2 Score 2 1 2 1 3 6    PHQ- 9 Score 7  9  13 11    Exception Documentation       Medical reason    Fall Risk    02/13/2022    2:16 PM 12/08/2021    1:54 PM 11/23/2021   11:25 AM 07/15/2021    3:00 PM 07/01/2021    2:45 PM  [place] in the past year? 1 0 1 1 1   Comment   continues to report frequent falls; so many she is unableto quantify; reports falls without serious injury; reports continues using wheelchair primarily  Denies new/ recent falls since recent hospital [facility]   Number falls in past yr:   1 1 1   Injury with Fall?   0 0 0  Risk for fall due to : History of fall(s);Impaired balance/gait;Impaired mobility;Medication side effect  Impaired mobility;Medication side effect History of fall(s);Impaired mobility;Medication side effect;Impaired balance/gait Impaired mobility;History of fall(s);Medication side effect;Impaired balance/gait;Other (Comment)  Risk for fall due to: Comment     severe COPD  Follow up Falls prevention discussed  Falls prevention discussed Falls prevention discussed Falls prevention discussed    FALL RISK PREVENTION PERTAINING TO THE HOME:  Any stairs in or around the home? No  If so, are there any without handrails? No  Home free of loose throw rugs in walkways, pet beds, electrical cords, etc? Yes  Adequate lighting in your home to reduce risk of falls? Yes   ASSISTIVE DEVICES UTILIZED TO PREVENT FALLS:  Life alert? No  Use of a cane, walker or w/c? Yes  Grab bars in the bathroom? Yes  Shower chair or bench in shower? No  Elevated toilet seat or a handicapped toilet? Yes   TIMED UP AND GO:  Was the test performed? No .  Length of time to ambulate 10 feet: N/A sec.   Appearance of gait: Gait not evaluated during this visit.  Cognitive Function:        02/13/2022    2:19 PM 01/28/2020   11:51 AM  6CIT Screen  What Year? 0 points 0 points  What month? 0 points 0 points  What time? 0 points 3 points  Count back from 20 0 points 0 points  Months in reverse 2 points 2 points  Repeat phrase 2 points 2 points  Total Score 4 points 7 points    Immunizations Immunization History  Administered Date(s) Administered   Fluad Quad(high Dose 65+) 04/03/2019, 04/29/2020, 03/10/2021   Influenza Split 03/27/2012   Influenza Whole 05/08/2007, 04/08/2009, 03/24/2011   Influenza, High Dose Seasonal PF 04/04/2017   Influenza,inj,Quad PF,6+ Mos 03/05/2013, 05/01/2014, 03/05/2015, 04/17/2016, 03/13/2018   PFIZER(Purple  Top)SARS-COV-2 Vaccination 08/19/2019, 09/09/2019, 05/17/2020   Pneumococcal Conjugate-13 07/04/2016   Pneumococcal Polysaccharide-23 07/14/2015   Td 04/13/2016   Tdap 07/14/2015, 10/06/2020    TDAP status: Up to date  Flu Vaccine status: Up to date  Pneumococcal vaccine status: Up to date  Covid-19 vaccine status: Completed vaccines  Qualifies for Shingles Vaccine? Yes   Zostavax completed No   Shingrix Completed?: No.    Education has been provided regarding the importance of this vaccine. Patient has been advised to call insurance company to determine out of pocket expense if they have not yet received this vaccine. Advised may also receive vaccine at local pharmacy or Health Dept. Verbalized acceptance and understanding.  Screening Tests Health Maintenance  Topic Date Due   Hepatitis C Screening  Never done   Zoster Vaccines- Shingrix (1 of 2) Never done   DEXA SCAN  Never done   COLONOSCOPY (Pts 45-71yrs Insurance coverage will need to be confirmed)  10/29/2019   COVID-19 Vaccine (4 - Pfizer risk series) 07/12/2020   Pneumonia Vaccine 3+ Years old (3 - PPSV23 or PCV20) 07/13/2020   MAMMOGRAM  02/01/2022   INFLUENZA VACCINE  01/24/2022   TETANUS/TDAP  10/07/2030   HPV VACCINES  Aged Out    Health Maintenance  Health Maintenance Due  Topic Date Due   Hepatitis C Screening  Never done   Zoster Vaccines- Shingrix (1 of 2) Never done   DEXA SCAN  Never done   COLONOSCOPY (Pts 45-55yrs Insurance coverage will need to be confirmed)  10/29/2019   COVID-19 Vaccine (4 - Pfizer risk series) 07/12/2020   Pneumonia Vaccine 40+ Years old (3 - PPSV23 or PCV20) 07/13/2020   MAMMOGRAM  02/01/2022   INFLUENZA VACCINE  01/24/2022    Colorectal cancer screening: No longer required.  PATIENT DECLINED  Mammogram status: Ordered 02/13/2022. Pt provided with contact info and advised to call to schedule appt.   Bone density scan: never done  Lung Cancer Screening: (Low Dose CT Chest  recommended if Age 21-80 years, 30 pack-year currently smoking OR have quit w/in 15years.) does qualify.   Lung Cancer Screening Referral: no;  patient already with pulmonology and oncology  Additional Screening:  Hepatitis C Screening: does qualify; Completed no  Vision Screening: Recommended annual ophthalmology exams for early detection of glaucoma and other disorders of the eye. Is the patient up to date with their annual eye exam?  Yes  Who is the provider or what is the name of the office in which the patient attends annual eye exams? Madilyn Hook, OD. If pt is not established with a provider, would they like to be referred to a provider to establish care? No .   Dental Screening: Recommended annual dental exams for proper oral hygiene  Community Resource Referral / Chronic Care Management: CRR required this visit?  No   CCM required this visit?  No      Plan:     I have personally reviewed and noted the following in the patient's chart:   Medical and social history Use of alcohol, tobacco or illicit drugs  Current medications and supplements including opioid prescriptions. Patient is currently taking opioid prescriptions. Information provided to patient regarding non-opioid alternatives. Patient advised to discuss non-opioid treatment plan with their provider. Functional ability and status Nutritional status Physical activity Advanced directives List of other physicians Hospitalizations, surgeries, and ER visits in previous 12 months Vitals Screenings to include cognitive, depression, and falls Referrals and appointments  In addition, I have reviewed and discussed with patient certain preventive protocols, quality metrics, and best practice recommendations. A written personalized care plan for preventive services as well as general preventive health recommendations were provided to patient.     Sheral Flow, LPN   [id number]   Nurse Notes:  There were no  vitals filed for this visit. There is no height or weight on file to calculate BMI.    Medical screening examination/treatment/procedure(s) were performed by non-physician practitioner and as supervising physician I was immediately available for consultation/collaboration.  I agree with above. Lew Dawes, MD

## 2022-02-13 NOTE — Patient Instructions (Signed)
Kaylee Hansen , Thank you for taking time to come for your Medicare Wellness Visit. I appreciate your ongoing commitment to your health goals. Please review the following plan we discussed and let me know if I can assist you in the future.   Screening recommendations/referrals: Colonoscopy: Patient declined  Mammogram: Order placed on 02/13/2022 Bone Density: Never done Recommended yearly ophthalmology/optometry visit for glaucoma screening and checkup Recommended yearly dental visit for hygiene and checkup  Vaccinations: Influenza vaccine: 03/10/2021 Pneumococcal vaccine: 07/14/2015, 07/04/2016 Tdap vaccine: 10/06/2020; due every 10 years Shingles vaccine: due    Covid-19: 08/19/2019, 09/09/2019, 05/17/2020  Advanced directives: No  Conditions/risks identified: Yes; T quit smoking.  Next appointment: Please schedule your next Medicare Wellness Visit with your Nurse Health Advisor in 1 year by calling [phone].   Preventive Care 34 Years and Older, Female Preventive care refers to lifestyle choices and visits with your health care provider that can promote health and wellness. What does preventive care include? A yearly physical exam. This is also called an annual well check. Dental exams once or twice a year. Routine eye exams. Ask your health care provider how often you should have your eyes checked. Personal lifestyle choices, including: Daily care of your teeth and gums. Regular physical activity. Eating a healthy diet. Avoiding tobacco and drug use. Limiting alcohol use. Practicing safe sex. Taking low-dose aspirin every day. Taking vitamin and mineral supplements as recommended by your health care provider. What happens during an annual well check? The services and screenings done by your health care provider during your annual well check will depend on your age, overall health, lifestyle risk factors, and family history of disease. Counseling  Your health care provider may  ask you questions about your: Alcohol use. Tobacco use. Drug use. Emotional well-being. Home and relationship well-being. Sexual activity. Eating habits. History of falls. Memory and ability to understand (cognition). Work and work Statistician. Reproductive health. Screening  You may have the following tests or measurements: Height, weight, and BMI. Blood pressure. Lipid and cholesterol levels. These may be checked every 5 years, or more frequently if you are over 34 years old. Skin check. Lung cancer screening. You may have this screening every year starting at age 74 if you have a 30-pack-year history of smoking and currently smoke or have quit within the past 15 years. Fecal occult blood test (FOBT) of the stool. You may have this test every year starting at age 67. Flexible sigmoidoscopy or colonoscopy. You may have a sigmoidoscopy every 5 years or a colonoscopy every 10 years starting at age 28. Hepatitis C blood test. Hepatitis B blood test. Sexually transmitted disease (STD) testing. Diabetes screening. This is done by checking your blood sugar (glucose) after you have not eaten for a while (fasting). You may have this done every 1-3 years. Bone density scan. This is done to screen for osteoporosis. You may have this done starting at age 34. Mammogram. This may be done every 1-2 years. Talk to your health care provider about how often you should have regular mammograms. Talk with your health care provider about your test results, treatment options, and if necessary, the need for more tests. Vaccines  Your health care provider may recommend certain vaccines, such as: Influenza vaccine. This is recommended every year. Tetanus, diphtheria, and acellular pertussis (Tdap, Td) vaccine. You may need a Td booster every 10 years. Zoster vaccine. You may need this after age 76. Pneumococcal 13-valent conjugate (PCV13) vaccine. One dose is recommended after  age 44. Pneumococcal  polysaccharide (PPSV23) vaccine. One dose is recommended after age 59. Talk to your health care provider about which screenings and vaccines you need and how often you need them. This information is not intended to replace advice given to you by your health care provider. Make sure you discuss any questions you have with your health care provider. Document Released: 07/09/2015 Document Revised: 03/01/2016 Document Reviewed: 04/13/2015 Elsevier Interactive Patient Education  2017 [place] Prevention in the Home Falls can cause injuries. They can happen to people of all ages. There are many things you can do to make your home safe and to help prevent falls. What can I do on the outside of my home? Regularly fix the edges of walkways and driveways and fix any cracks. Remove anything that might make you trip as you walk through a door, such as a raised step or threshold. Trim any bushes or trees on the path to your home. Use bright outdoor lighting. Clear any walking paths of anything that might make someone trip, such as rocks or tools. Regularly check to see if handrails are loose or broken. Make sure that both sides of any steps have handrails. Any raised decks and porches should have guardrails on the edges. Have any leaves, snow, or ice cleared regularly. Use sand or salt on walking paths during winter. Clean up any spills in your garage right away. This includes oil or grease spills. What can I do in the bathroom? Use night lights. Install grab bars by the toilet and in the tub and shower. Do not use towel bars as grab bars. Use non-skid mats or decals in the tub or shower. If you need to sit down in the shower, use a plastic, non-slip stool. Keep the floor dry. Clean up any water that spills on the floor as soon as it happens. Remove soap buildup in the tub or shower regularly. Attach bath mats securely with double-sided non-slip rug tape. Do not have throw rugs and other  things on the floor that can make you trip. What can I do in the bedroom? Use night lights. Make sure that you have a light by your bed that is easy to reach. Do not use any sheets or blankets that are too big for your bed. They should not hang down onto the floor. Have a firm chair that has side arms. You can use this for support while you get dressed. Do not have throw rugs and other things on the floor that can make you trip. What can I do in the kitchen? Clean up any spills right away. Avoid walking on wet floors. Keep items that you use a lot in easy-to-reach places. If you need to reach something above you, use a strong step stool that has a grab bar. Keep electrical cords out of the way. Do not use floor polish or wax that makes floors slippery. If you must use wax, use non-skid floor wax. Do not have throw rugs and other things on the floor that can make you trip. What can I do with my stairs? Do not leave any items on the stairs. Make sure that there are handrails on both sides of the stairs and use them. Fix handrails that are broken or loose. Make sure that handrails are as long as the stairways. Check any carpeting to make sure that it is firmly attached to the stairs. Fix any carpet that is loose or worn. Avoid having throw rugs  at the top or bottom of the stairs. If you do have throw rugs, attach them to the floor with carpet tape. Make sure that you have a light switch at the top of the stairs and the bottom of the stairs. If you do not have them, ask someone to add them for you. What else can I do to help prevent falls? Wear shoes that: Do not have high heels. Have rubber bottoms. Are comfortable and fit you well. Are closed at the toe. Do not wear sandals. If you use a stepladder: Make sure that it is fully opened. Do not climb a closed stepladder. Make sure that both sides of the stepladder are locked into place. Ask someone to hold it for you, if possible. Clearly  mark and make sure that you can see: Any grab bars or handrails. First and last steps. Where the edge of each step is. Use tools that help you move around (mobility aids) if they are needed. These include: Canes. Walkers. Scooters. Crutches. Turn on the lights when you go into a dark area. Replace any light bulbs as soon as they burn out. Set up your furniture so you have a clear path. Avoid moving your furniture around. If any of your floors are uneven, fix them. If there are any pets around you, be aware of where they are. Review your medicines with your doctor. Some medicines can make you feel dizzy. This can increase your chance of falling. Ask your doctor what other things that you can do to help prevent falls. This information is not intended to replace advice given to you by your health care provider. Make sure you discuss any questions you have with your health care provider. Document Released: 04/08/2009 Document Revised: 11/18/2015 Document Reviewed: 07/17/2014 Elsevier Interactive Patient Education  2017 [employer].

## 2022-02-16 DIAGNOSIS — C3490 Malignant neoplasm of unspecified part of unspecified bronchus or lung: Secondary | ICD-10-CM | POA: Diagnosis not present

## 2022-02-27 ENCOUNTER — Other Ambulatory Visit: Payer: Self-pay | Admitting: Hematology & Oncology

## 2022-02-28 ENCOUNTER — Encounter: Payer: Self-pay | Admitting: Family

## 2022-03-03 ENCOUNTER — Other Ambulatory Visit: Payer: Self-pay | Admitting: Cardiology

## 2022-03-13 ENCOUNTER — Ambulatory Visit (INDEPENDENT_AMBULATORY_CARE_PROVIDER_SITE_OTHER): Payer: Medicare HMO | Admitting: Internal Medicine

## 2022-03-13 ENCOUNTER — Other Ambulatory Visit: Payer: Self-pay | Admitting: Hematology & Oncology

## 2022-03-13 ENCOUNTER — Encounter: Payer: Self-pay | Admitting: Internal Medicine

## 2022-03-13 VITALS — BP 118/42 | HR 82 | Temp 97.7°F | Ht 65.0 in | Wt 128.8 lb

## 2022-03-13 DIAGNOSIS — M5440 Lumbago with sciatica, unspecified side: Secondary | ICD-10-CM

## 2022-03-13 DIAGNOSIS — J449 Chronic obstructive pulmonary disease, unspecified: Secondary | ICD-10-CM

## 2022-03-13 DIAGNOSIS — I25119 Atherosclerotic heart disease of native coronary artery with unspecified angina pectoris: Secondary | ICD-10-CM | POA: Diagnosis not present

## 2022-03-13 DIAGNOSIS — Z23 Encounter for immunization: Secondary | ICD-10-CM | POA: Diagnosis not present

## 2022-03-13 DIAGNOSIS — C3490 Malignant neoplasm of unspecified part of unspecified bronchus or lung: Secondary | ICD-10-CM

## 2022-03-13 DIAGNOSIS — G8929 Other chronic pain: Secondary | ICD-10-CM

## 2022-03-13 DIAGNOSIS — R69 Illness, unspecified: Secondary | ICD-10-CM | POA: Diagnosis not present

## 2022-03-13 DIAGNOSIS — F411 Generalized anxiety disorder: Secondary | ICD-10-CM

## 2022-03-13 MED ORDER — KETOCONAZOLE 2 % EX CREA: TOPICAL_CREAM | CUTANEOUS | 2 refills | Status: DC

## 2022-03-13 MED ORDER — HYDROCODONE-ACETAMINOPHEN 10-325 MG PO TABS: 1.0000 | ORAL_TABLET | Freq: Four times a day (QID) | ORAL | 0 refills | Status: DC | PRN

## 2022-03-13 MED ORDER — HYDROCODONE-ACETAMINOPHEN 10-325 MG PO TABS: 1.0000 | ORAL_TABLET | Freq: Three times a day (TID) | ORAL | 0 refills | Status: AC | PRN

## 2022-03-13 NOTE — Assessment & Plan Note (Signed)
No CP  On Imdur, Crestor, Plavix

## 2022-03-13 NOTE — Assessment & Plan Note (Addendum)
Albuterol MDI/HHN On O2 2.5 l/min Kaylee Hansen

## 2022-03-13 NOTE — Assessment & Plan Note (Addendum)
Norco prn On O2 2.5 l/min  S/p XRT

## 2022-03-13 NOTE — Assessment & Plan Note (Signed)
Chronic  Trazodone at hs Cont on Xanax prn  Potential benefits of a long term benzodiazepines  use as well as potential risks  and complications were explained to the patient and were aknowledged.

## 2022-03-13 NOTE — Assessment & Plan Note (Signed)
Continue w/Norco prn - shortage at the pharmacy  Potential benefits of a long term opioids use as well as potential risks (i.e. addiction risk, apnea etc) and complications (i.e. Somnolence, constipation and others) were explained to the patient and were aknowledged. Narcane nasal prn Do not take Norco w/Xanax

## 2022-03-13 NOTE — Progress Notes (Signed)
Subjective:  Patient ID: Kaylee Hansen, female    DOB: 1956/01/05  Age: 66 y.o. MRN: [id number]  CC: Follow-up (3 month f/u- Flu shot)   HPI Kaylee Hansen presents for COPD, LBP, lung cancer w/mets, anxiety  Outpatient Medications Prior to Visit  Medication Sig Dispense Refill   albuterol (PROVENTIL HFA;VENTOLIN HFA) 108 (90 Base) MCG/ACT inhaler Inhale 2 puffs into the lungs every 6 (six) hours as needed for wheezing or shortness of breath. 1 Inhaler 5   albuterol (PROVENTIL) (2.5 MG/3ML) 0.083% nebulizer solution INHALE 1 VIAL BY NEBULIZATION EVERY 4 HOURS AS NEEDED FOR WHEEZING OR SHORTNESS OF BREATH. 150 mL 5   ALPRAZolam (XANAX) 0.25 MG tablet Take 1 tablet (0.25 mg total) by mouth at bedtime as needed for anxiety. 90 tablet 2   aspirin EC 81 MG tablet Take 1 tablet (81 mg total) by mouth daily. 30 tablet 11   bisacodyl (DULCOLAX) 5 MG EC tablet Take 2 tablets (10 mg total) by mouth at bedtime. 30 tablet    Budeson-Glycopyrrol-Formoterol (BREZTRI AEROSPHERE) 160-9-4.8 MCG/ACT AERO Inhale 2 puffs into the lungs in the morning and at bedtime. 10.7 g 5   calcium carbonate (TUMS - DOSED IN MG ELEMENTAL CALCIUM) 500 MG chewable tablet Chew 1 tablet by mouth daily as needed for indigestion or heartburn.     Coenzyme Q10 (COQ10) 100 MG CAPS Take 100 mg by mouth in the morning.     diltiazem (CARDIZEM CD) 360 MG 24 hr capsule TAKE 1 CAPSULE BY MOUTH EVERY DAY 90 capsule 2   diphenhydrAMINE HCl, Sleep, (ZZZQUIL) 25 MG CAPS Take 50 mg by mouth at bedtime.     furosemide (LASIX) 20 MG tablet TAKE 1 TABLET BY MOUTH EVERY DAY 90 tablet 2   guaiFENesin (ROBITUSSIN) 100 MG/5ML liquid Take 200 mg by mouth 2 (two) times daily as needed for cough.     hydroxypropyl methylcellulose / hypromellose (ISOPTO TEARS / GONIOVISC) 2.5 % ophthalmic solution Place 1 drop into both eyes 3 (three) times daily as needed for dry eyes.     ipratropium-albuterol (DUONEB) 0.5-2.5 (3) MG/3ML SOLN Take 3 mLs by  nebulization every 4 (four) hours as needed. 360 mL 0   isosorbide mononitrate (IMDUR) 120 MG 24 hr tablet TAKE 1 TABLET BY MOUTH EVERY DAY 90 tablet 1   levothyroxine (SYNTHROID) 150 MCG tablet TAKE 1 TABLET BY MOUTH EVERY DAY 90 tablet 3   naloxegol oxalate (MOVANTIK) 12.5 MG TABS tablet Take 1 tablet (12.5 mg total) by mouth daily. 30 tablet 0   naproxen sodium (ALEVE) 220 MG tablet Take 220 mg by mouth daily as needed (For Headache).     nitroGLYCERIN (NITROSTAT) 0.4 MG SL tablet DISSOLVE ONE TABLET UNDER THE TONGUE EVERY 5 MINUTES AS NEEDED FOR CHEST PAIN.  DO NOT EXCEED A TOTAL OF 3 DOSES IN 15 MINUTES 25 tablet 6   OXYGEN Inhale 2.5 L/min into the lungs at bedtime.     pantoprazole (PROTONIX) 40 MG tablet TAKE 1 TABLET BY MOUTH DAILY BEFORE BREAKFAST 90 tablet 3   PARoxetine (PAXIL) 40 MG tablet TAKE 1 TABLET BY MOUTH EVERY DAY 90 tablet 3   phenytoin (DILANTIN) 100 MG ER capsule TAKE 100 MG EVERY MORNING AND 200 MG EVERY NIGHT. 270 capsule 3   promethazine (PHENERGAN) 25 MG tablet Take 1 tablet (25 mg total) by mouth every 8 (eight) hours as needed for nausea or vomiting. 30 tablet 0   Respiratory Therapy Supplies (FLUTTER) DEVI As directed 1  each 0   rosuvastatin (CRESTOR) 20 MG tablet TAKE 1 TABLET BY MOUTH EVERY DAY 90 tablet 0   SUMAtriptan (IMITREX) 100 MG tablet MAY REPEAT IN 2 HOURS IF HEADACHE PERSISTS OR RECURS. 9 tablet 3   topiramate (TOPAMAX) 50 MG tablet TAKE 1 TABLET (50 MG TOTAL) BY MOUTH 2 (TWO) TIMES DAILY AS NEEDED. TAKE AS NEEDED FOR HEADACHE 30 tablet 2   traZODone (DESYREL) 100 MG tablet TAKE 1 TABLET BY MOUTH EVERYDAY AT BEDTIME 90 tablet 1   Vitamin D, Ergocalciferol, (DRISDOL) 1.25 MG (50000 UNIT) CAPS capsule TAKE 1 CAPSULE (50,000 UNITS TOTAL) BY MOUTH EVERY MONDAY. 12 capsule 3   HYDROcodone-acetaminophen (NORCO) 10-325 MG tablet Take 1 tablet by mouth every 6 (six) hours as needed for severe pain. 120 tablet 0   ketoconazole (NIZORAL) 2 % cream APPLY TO AFFECTED  AREA EVERY DAY (Patient taking differently: Apply 1 application  topically daily as needed for irritation. APPLY TO AFFECTED AREA EVERY DAY) 45 g 1   Facility-Administered Medications Prior to Visit  Medication Dose Route Frequency Provider Last Rate Last Admin   promethazine (PHENERGAN) injection 25 mg  25 mg Intramuscular Q6H PRN Binnie Rail, MD   25 mg at 10/19/21 1625    ROS: Review of Systems  Constitutional:  Positive for fatigue and unexpected weight change. Negative for activity change, appetite change and chills.  HENT:  Negative for congestion, mouth sores and sinus pressure.   Eyes:  Negative for visual disturbance.  Respiratory:  Negative for cough, chest tightness and wheezing.   Cardiovascular:  Negative for chest pain.  Gastrointestinal:  Negative for abdominal pain and nausea.  Genitourinary:  Negative for difficulty urinating, frequency and vaginal pain.  Musculoskeletal:  Positive for arthralgias, back pain and gait problem.  Skin:  Negative for pallor and rash.  Neurological:  Negative for dizziness, tremors, weakness, numbness and headaches.  Psychiatric/Behavioral:  Positive for confusion, decreased concentration, dysphoric mood and sleep disturbance. Negative for agitation and suicidal ideas. The patient is nervous/anxious.     Objective:  BP (!) 118/42 (BP Location: Left Arm)   Pulse 82   Temp 97.7 F (36.5 C) (Oral)   Ht 5\' 5"  (1.651 m)   Wt 128 lb 12.8 oz (58.4 kg)   SpO2 92%   BMI 21.43 kg/m   BP Readings from Last 3 Encounters:  03/13/22 (!) 118/42  02/10/22 (!) 124/33  01/23/22 (!) 111/41    Wt Readings from Last 3 Encounters:  03/13/22 128 lb 12.8 oz (58.4 kg)  02/10/22 130 lb (59 kg)  01/23/22 138 lb 9.6 oz (62.9 kg)    Physical Exam Constitutional:      General: She is not in acute distress.    Appearance: She is well-developed.  HENT:     Head: Normocephalic.     Right Ear: External ear normal.     Left Ear: External ear normal.      Nose: Nose normal.  Eyes:     General:        Right eye: No discharge.        Left eye: No discharge.     Conjunctiva/sclera: Conjunctivae normal.     Pupils: Pupils are equal, round, and reactive to light.  Neck:     Thyroid: No thyromegaly.     Vascular: No JVD.     Trachea: No tracheal deviation.  Cardiovascular:     Rate and Rhythm: Normal rate and regular rhythm.     Heart sounds:  Normal heart sounds.  Pulmonary:     Effort: No respiratory distress.     Breath sounds: No stridor. No wheezing.  Abdominal:     General: Bowel sounds are normal. There is no distension.     Palpations: Abdomen is soft. There is no mass.     Tenderness: There is no abdominal tenderness. There is no guarding or rebound.  Musculoskeletal:        General: Tenderness present.     Cervical back: Normal range of motion and neck supple. No rigidity.  Lymphadenopathy:     Cervical: No cervical adenopathy.  Skin:    Findings: No erythema or rash.  Neurological:     Cranial Nerves: No cranial nerve deficit.     Motor: Weakness present. No abnormal muscle tone.     Coordination: Coordination normal.     Gait: Gait abnormal.     Deep Tendon Reflexes: Reflexes normal.  Psychiatric:        Behavior: Behavior normal.        Thought Content: Thought content normal.        Judgment: Judgment normal.   In a w/c LS w/pain  Lab Results  Component Value Date   WBC 6.0 02/10/2022   HGB 11.6 (L) 02/10/2022   HCT 36.0 02/10/2022   PLT 237 02/10/2022   GLUCOSE 100 (H) 02/10/2022   CHOL 128 06/29/2018   TRIG 184 (H) 06/29/2018   HDL 35 (L) 06/29/2018   LDLDIRECT 187.0 07/14/2015   LDLCALC 56 06/29/2018   ALT 7 02/10/2022   AST 12 (L) 02/10/2022   NA 138 02/10/2022   K 3.7 02/10/2022   CL 101 02/10/2022   CREATININE 0.98 02/10/2022   BUN 12 02/10/2022   CO2 30 02/10/2022   TSH 1.84 01/01/2019   INR 0.9 09/22/2019   HGBA1C  09/17/2008    5.0 (NOTE)   The ADA recommends the following  therapeutic goal for glycemic   control related to Hgb A1C measurement:   Goal of Therapy:   < 7.0% Hgb A1C   Reference: American Diabetes Association: Clinical Practice   Recommendations 2008, Diabetes Care,  2008, 31:(Suppl 1).    No results found.  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       Low back pain    Continue w/Norco prn - shortage at the pharmacy  Potential benefits of a long term opioids use as well as potential risks (i.e. addiction risk, apnea etc) and complications (i.e. Somnolence, constipation and others) were explained to the patient and were aknowledged. 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    Dispense:  120 tablet    Refill:  0    Please fill on or after 03/17/22   HYDROcodone-acetaminophen (NORCO) 10-325 MG tablet    Sig: Take 1 tablet by mouth every 8 (eight) hours as needed for up to 5 days for severe pain.     Dispense:  120 tablet    Refill:  0    Please fill on or after 04/16/22   ketoconazole (NIZORAL) 2 % cream    Sig: APPLY TO AFFECTED AREA EVERY DAY    Dispense:  60 g    Refill:  2      Follow-up: Return in about 3 months (around 06/12/2022) for a follow-up visit.  Walker Kehr, MD

## 2022-03-19 DIAGNOSIS — C3490 Malignant neoplasm of unspecified part of unspecified bronchus or lung: Secondary | ICD-10-CM | POA: Diagnosis not present

## 2022-03-29 ENCOUNTER — Encounter: Payer: Self-pay | Admitting: Hematology & Oncology

## 2022-03-29 ENCOUNTER — Inpatient Hospital Stay: Payer: Medicare HMO | Admitting: Hematology & Oncology

## 2022-03-29 ENCOUNTER — Inpatient Hospital Stay: Payer: Medicare HMO | Attending: Hematology & Oncology

## 2022-03-29 ENCOUNTER — Other Ambulatory Visit: Payer: Self-pay

## 2022-03-29 VITALS — BP 108/31 | HR 78 | Temp 98.1°F | Resp 16 | Wt 126.0 lb

## 2022-03-29 DIAGNOSIS — J449 Chronic obstructive pulmonary disease, unspecified: Secondary | ICD-10-CM | POA: Diagnosis not present

## 2022-03-29 DIAGNOSIS — C3410 Malignant neoplasm of upper lobe, unspecified bronchus or lung: Secondary | ICD-10-CM | POA: Diagnosis not present

## 2022-03-29 DIAGNOSIS — R911 Solitary pulmonary nodule: Secondary | ICD-10-CM | POA: Diagnosis not present

## 2022-03-29 DIAGNOSIS — D500 Iron deficiency anemia secondary to blood loss (chronic): Secondary | ICD-10-CM

## 2022-03-29 DIAGNOSIS — D509 Iron deficiency anemia, unspecified: Secondary | ICD-10-CM | POA: Insufficient documentation

## 2022-03-29 DIAGNOSIS — Z79899 Other long term (current) drug therapy: Secondary | ICD-10-CM | POA: Insufficient documentation

## 2022-03-29 DIAGNOSIS — C3490 Malignant neoplasm of unspecified part of unspecified bronchus or lung: Secondary | ICD-10-CM

## 2022-03-29 LAB — CMP (CANCER CENTER ONLY)
ALT: 6 U/L (ref 0–44)
AST: 12 U/L — ABNORMAL LOW (ref 15–41)
Albumin: 3.5 g/dL (ref 3.5–5.0)
Alkaline Phosphatase: 140 U/L — ABNORMAL HIGH (ref 38–126)
Anion gap: 7 (ref 5–15)
BUN: 12 mg/dL (ref 8–23)
CO2: 28 mmol/L (ref 22–32)
Calcium: 9.6 mg/dL (ref 8.9–10.3)
Chloride: 102 mmol/L (ref 98–111)
Creatinine: 1.14 mg/dL — ABNORMAL HIGH (ref 0.44–1.00)
GFR, Estimated: 52 mL/min — ABNORMAL LOW (ref >60)
Glucose, Bld: 98 mg/dL (ref 70–99)
Potassium: 4 mmol/L (ref 3.5–5.1)
Sodium: 137 mmol/L (ref 135–145)
Total Bilirubin: 0.4 mg/dL (ref 0.3–1.2)
Total Protein: 6.6 g/dL (ref 6.5–8.1)

## 2022-03-29 LAB — CBC WITH DIFFERENTIAL (CANCER CENTER ONLY)
Abs Immature Granulocytes: 0.1 10*3/uL — ABNORMAL HIGH (ref 0.00–0.07)
Basophils Absolute: 0.1 10*3/uL (ref 0.0–0.1)
Basophils Relative: 1 %
Eosinophils Absolute: 0.2 10*3/uL (ref 0.0–0.5)
Eosinophils Relative: 3 %
HCT: 32 % — ABNORMAL LOW (ref 36.0–46.0)
Hemoglobin: 10.3 g/dL — ABNORMAL LOW (ref 12.0–15.0)
Immature Granulocytes: 2 %
Lymphocytes Relative: 11 %
Lymphs Abs: 0.7 10*3/uL (ref 0.7–4.0)
MCH: 31.5 pg (ref 26.0–34.0)
MCHC: 32.2 g/dL (ref 30.0–36.0)
MCV: 97.9 fL (ref 80.0–100.0)
Monocytes Absolute: 0.5 10*3/uL (ref 0.1–1.0)
Monocytes Relative: 7 %
Neutro Abs: 4.7 10*3/uL (ref 1.7–7.7)
Neutrophils Relative %: 76 %
Platelet Count: 249 10*3/uL (ref 150–400)
RBC: 3.27 MIL/uL — ABNORMAL LOW (ref 3.87–5.11)
RDW: 13.7 % (ref 11.5–15.5)
WBC Count: 6.2 10*3/uL (ref 4.0–10.5)
nRBC: 0 % (ref 0.0–0.2)

## 2022-03-29 LAB — RETICULOCYTES
Immature Retic Fract: 14.5 % (ref 2.3–15.9)
RBC.: 3.3 MIL/uL — ABNORMAL LOW (ref 3.87–5.11)
Retic Count, Absolute: 46.5 10*3/uL (ref 19.0–186.0)
Retic Ct Pct: 1.4 % (ref 0.4–3.1)

## 2022-03-29 LAB — FERRITIN: Ferritin: 53 ng/mL (ref 11–307)

## 2022-03-29 NOTE — Progress Notes (Signed)
Hematology and Oncology Follow Up Visit  Kaylee Hansen [id number] [birth year] 66 y.o. 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 is here today with her husband for follow-up.  She is doing okay.  She does not have a lot of energy which is not surprising.  Her hemoglobin is dropping.  Her MCV is a little bit lower.  As such, we may have to give her iron.  We will see what her iron level is.  When we last saw her back in August, her ferritin was 61 with an iron saturation of 20%.  She has had no flareups of her COPD.  I think she is eating okay.  There is been no nausea or vomiting.  She has had no change in bowel or bladder habits.  She does take laxatives.  She may have little bit of diarrhea when she takes too many laxatives.  She has had no problems with increased pain.  I think there is issues with chronic pain.  Currently, I would have said that her performance status is probably ECOG 2.    Medications:  Allergies as of 03/29/2022       Reactions   Nitrofuran Derivatives Other (See Comments)   confusion   Oxycodone Hcl Other (See Comments)   Knocked her out for 6 days   Morphine Other (See Comments)   "Makes me go crazy."    Penicillins Hives   Has patient had a PCN reaction causing immediate rash, facial/tongue/throat swelling, SOB or lightheadedness with hypotension: unknown Has patient had a PCN reaction causing severe rash involving mucus membranes or skin necrosis: unknown Has patient had a PCN reaction that required hospitalization No Has patient had a PCN reaction occurring within the last 10 years: No If all of  the above answers are "NO", then may proceed with Cephalosporin use. Other reaction(s): Rash-Generalized   Tape Other (See Comments)   Skin turns red and burns        Medication List        Accurate as of March 29, 2022  2:13 PM. If you have any questions, ask your nurse or doctor.          albuterol 108 (90 Base) MCG/ACT inhaler Commonly known as: VENTOLIN HFA Inhale 2 puffs into the lungs every 6 (six) hours as needed for wheezing or shortness of breath.   albuterol (2.5 MG/3ML) 0.083% nebulizer solution Commonly known as: PROVENTIL INHALE 1 VIAL BY NEBULIZATION EVERY 4 HOURS AS NEEDED FOR WHEEZING OR SHORTNESS OF BREATH.   ALPRAZolam 0.25 MG tablet Commonly known as: XANAX Take 1 tablet (0.25 mg total) by mouth at bedtime as needed for anxiety.   aspirin EC 81 MG tablet Take 1 tablet (81 mg total) by mouth daily.   bisacodyl 5 MG EC tablet Commonly known as: Dulcolax Take 2 tablets (10 mg total) by mouth at bedtime.   Breztri Aerosphere 160-9-4.8 MCG/ACT Aero Generic drug: Budeson-Glycopyrrol-Formoterol Inhale 2 puffs into the lungs in the morning and at bedtime.   calcium carbonate 500 MG chewable tablet Commonly known as: TUMS - dosed in mg elemental calcium Chew  1 tablet by mouth daily as needed for indigestion or heartburn.   CoQ10 100 MG Caps Take 100 mg by mouth in the morning.   diltiazem 360 MG 24 hr capsule Commonly known as: CARDIZEM CD TAKE 1 CAPSULE BY MOUTH EVERY DAY   [name] As directed   furosemide 20 MG tablet Commonly known as: LASIX TAKE 1 TABLET BY MOUTH EVERY DAY   guaiFENesin 100 MG/5ML liquid Commonly known as: ROBITUSSIN Take 200 mg by mouth 2 (two) times daily as needed for cough.   HYDROcodone-acetaminophen 10-325 MG tablet Commonly known as: NORCO Take 1 tablet by mouth every 6 (six) hours as needed for severe pain.   HYDROcodone-acetaminophen 10-325 MG tablet Commonly known as: NORCO Take 1 tablet by mouth every  8 (eight) hours as needed for up to 5 days for severe pain.   hydroxypropyl methylcellulose / hypromellose 2.5 % ophthalmic solution Commonly known as: ISOPTO TEARS / GONIOVISC Place 1 drop into both eyes 3 (three) times daily as needed for dry eyes.   ipratropium-albuterol 0.5-2.5 (3) MG/3ML Soln Commonly known as: DUONEB Take 3 mLs by nebulization every 4 (four) hours as needed.   isosorbide mononitrate 120 MG 24 hr tablet Commonly known as: IMDUR TAKE 1 TABLET BY MOUTH EVERY DAY   ketoconazole 2 % cream Commonly known as: NIZORAL APPLY TO AFFECTED AREA EVERY DAY   levothyroxine 150 MCG tablet Commonly known as: SYNTHROID TAKE 1 TABLET BY MOUTH EVERY DAY   naloxegol oxalate 12.5 MG Tabs tablet Commonly known as: MOVANTIK Take 1 tablet (12.5 mg total) by mouth daily.   naproxen sodium 220 MG tablet Commonly known as: ALEVE Take 220 mg by mouth daily as needed (For Headache).   nitroGLYCERIN 0.4 MG SL tablet Commonly known as: NITROSTAT DISSOLVE ONE TABLET UNDER THE TONGUE EVERY 5 MINUTES AS NEEDED FOR CHEST PAIN.  DO NOT EXCEED A TOTAL OF 3 DOSES IN 15 MINUTES   OXYGEN Inhale 2.5 L/min into the lungs at bedtime.   pantoprazole 40 MG tablet Commonly known as: PROTONIX TAKE 1 TABLET BY MOUTH DAILY BEFORE BREAKFAST   PARoxetine 40 MG tablet Commonly known as: PAXIL TAKE 1 TABLET BY MOUTH EVERY DAY   phenytoin 100 MG ER capsule Commonly known as: DILANTIN TAKE 100 MG EVERY MORNING AND 200 MG EVERY NIGHT.   promethazine 25 MG tablet Commonly known as: PHENERGAN Take 1 tablet (25 mg total) by mouth every 8 (eight) hours as needed for nausea or vomiting.   rosuvastatin 20 MG tablet Commonly known as: CRESTOR TAKE 1 TABLET BY MOUTH EVERY DAY   SUMAtriptan 100 MG tablet Commonly known as: IMITREX MAY REPEAT IN 2 HOURS IF HEADACHE PERSISTS OR RECURS.   topiramate 50 MG tablet Commonly known as: TOPAMAX TAKE 1 TABLET (50 MG TOTAL) BY MOUTH 2 (TWO) TIMES DAILY AS  NEEDED. TAKE AS NEEDED FOR HEADACHE   traZODone 100 MG tablet Commonly known as: DESYREL TAKE 1 TABLET BY MOUTH EVERYDAY AT BEDTIME   Vitamin D (Ergocalciferol) 1.25 MG (50000 UNIT) Caps capsule Commonly known as: DRISDOL TAKE 1 CAPSULE (50,000 UNITS TOTAL) BY MOUTH EVERY MONDAY.   ZzzQuil 25 MG Caps Generic drug: diphenhydrAMINE HCl (Sleep) Take 50 mg by mouth at bedtime.        Allergies:  Allergies  Allergen Reactions   Nitrofuran Derivatives Other (See Comments)    confusion   Oxycodone Hcl Other (See Comments)    Knocked her out for 6 days   Morphine Other (See Comments)    "  Makes me go crazy."    Penicillins Hives    Has patient had a PCN reaction causing immediate rash, facial/tongue/throat swelling, SOB or lightheadedness with hypotension: unknown Has patient had a PCN reaction causing severe rash involving mucus membranes or skin necrosis: unknown Has patient had a PCN reaction that required hospitalization No Has patient had a PCN reaction occurring within the last 10 years: No If all of the above answers are "NO", then may proceed with Cephalosporin use.  Other reaction(s): Rash-Generalized   Tape Other (See Comments)    Skin turns red and burns    Past Medical History, Surgical history, Social history, and Family History were reviewed and updated.  Review of Systems: Review of Systems  Constitutional: Negative.   HENT: Negative.    Eyes: Negative.   Respiratory:  Positive for cough, sputum production, shortness of breath and wheezing.   Cardiovascular:  Positive for palpitations and leg swelling.  Gastrointestinal:  Positive for diarrhea and nausea.  Genitourinary:  Positive for frequency and urgency.  Musculoskeletal:  Positive for joint pain and myalgias.  Skin: Negative.   Neurological:  Positive for weakness.  Endo/Heme/Allergies: Negative.   Psychiatric/Behavioral: Negative.       Physical Exam:  weight is 126 lb (57.2 kg). Her oral  temperature is 98.1 F (36.7 C). Her blood pressure is 108/31 (abnormal) and her pulse is 78. Her respiration is 16 and oxygen saturation is 93%.   Wt Readings from Last 3 Encounters:  03/29/22 126 lb (57.2 kg)  03/13/22 128 lb 12.8 oz (58.4 kg)  02/10/22 130 lb (59 kg)    Physical Exam Vitals reviewed.  HENT:     Head: Normocephalic and atraumatic.  Eyes:     Pupils: Pupils are equal, round, and reactive to light.  Cardiovascular:     Rate and Rhythm: Normal rate and regular rhythm.     Heart sounds: Normal heart sounds.  Pulmonary:     Effort: Pulmonary effort is normal.     Breath sounds: Normal breath sounds.     Comments: Pulmonary exam shows wheezes or rhonchi bilaterally.  She has relatively decent air movement. Abdominal:     General: Bowel sounds are normal.     Palpations: Abdomen is soft.  Musculoskeletal:        General: No tenderness or deformity. Normal range of motion.     Cervical back: Normal range of motion.     Comments: Extremities does show some mild edema in the legs bilaterally.  Lymphadenopathy:     Cervical: No cervical adenopathy.  Skin:    General: Skin is warm and dry.     Findings: No erythema or rash.  Neurological:     Mental Status: She is alert and oriented to person, place, and time.  Psychiatric:        Behavior: Behavior normal.        Thought Content: Thought content normal.        Judgment: Judgment normal.      Lab Results  Component Value Date   WBC 6.2 03/29/2022   HGB 10.3 (L) 03/29/2022   HCT 32.0 (L) 03/29/2022   MCV 97.9 03/29/2022   PLT 249 03/29/2022   Lab Results  Component Value Date   FERRITIN 61 02/10/2022   IRON 43 02/10/2022   TIBC 214 (L) 02/10/2022   UIBC 171 02/10/2022   IRONPCTSAT 20 02/10/2022   Lab Results  Component Value Date   RETICCTPCT 1.4 03/29/2022   RBC 3.27 (  L) 03/29/2022   RBC 3.30 (L) 03/29/2022   No results found for: "KPAFRELGTCHN", "LAMBDASER", "KAPLAMBRATIO" No results found  for: "IGGSERUM", "IGA", "IGMSERUM" No results found for: "TOTALPROTELP", "ALBUMINELP", "A1GS", "A2GS", "BETS", "BETA2SER", "GAMS", "MSPIKE", "SPEI"   Chemistry      Component Value Date/Time   NA 137 03/29/2022 1324   NA 144 07/08/2018 1059   K 4.0 03/29/2022 1324   CL 102 03/29/2022 1324   CO2 28 03/29/2022 1324   BUN 12 03/29/2022 1324   BUN 12 07/08/2018 1059   CREATININE 1.14 (H) 03/29/2022 1324      Component Value Date/Time   CALCIUM 9.6 03/29/2022 1324   ALKPHOS 140 (H) 03/29/2022 1324   AST 12 (L) 03/29/2022 1324   ALT 6 03/29/2022 1324   BILITOT 0.4 03/29/2022 1324       Impression and Plan: Ms. Schiano is a very pleasant 66 yo caucasian female with clinical bronchogenic carcinoma and iron deficiency anemia.   She completed SBRT in May 2021.   She completed external beam radiation therapy to the right lung.  She completed this back on 03/02/2022.  I will do a PET scan on her in November.  I think this would be a very reasonable time to do a PET scan to see how she has responded to radiation therapy.  We will see what her iron level shows.  She may need to have some IV iron.  Overall, I does want her quality of life to be better.  I just  know this is quite challenging given her poor lung function.   [name], MD 10/4/20232:13 PM

## 2022-03-30 ENCOUNTER — Encounter: Payer: Self-pay | Admitting: *Deleted

## 2022-03-30 LAB — IRON AND IRON BINDING CAPACITY (CC-WL,HP ONLY)
Iron: 50 ug/dL (ref 28–170)
Saturation Ratios: 24 % (ref 10.4–31.8)
TIBC: 207 ug/dL — ABNORMAL LOW (ref 250–450)
UIBC: 157 ug/dL (ref 148–442)

## 2022-04-05 ENCOUNTER — Other Ambulatory Visit: Payer: Self-pay | Admitting: Internal Medicine

## 2022-04-05 DIAGNOSIS — M170 Bilateral primary osteoarthritis of knee: Secondary | ICD-10-CM | POA: Diagnosis not present

## 2022-04-18 DIAGNOSIS — C3490 Malignant neoplasm of unspecified part of unspecified bronchus or lung: Secondary | ICD-10-CM | POA: Diagnosis not present

## 2022-04-21 ENCOUNTER — Other Ambulatory Visit: Payer: Self-pay | Admitting: Family

## 2022-04-21 DIAGNOSIS — C3410 Malignant neoplasm of upper lobe, unspecified bronchus or lung: Secondary | ICD-10-CM

## 2022-04-26 ENCOUNTER — Ambulatory Visit (HOSPITAL_BASED_OUTPATIENT_CLINIC_OR_DEPARTMENT_OTHER)
Admission: RE | Admit: 2022-04-26 | Discharge: 2022-04-26 | Disposition: A | Discharge status: Home / Self Care | Payer: Medicare HMO | Source: Ambulatory Visit | Attending: Family | Admitting: Family

## 2022-04-26 ENCOUNTER — Inpatient Hospital Stay: Payer: Medicare HMO | Attending: Hematology & Oncology

## 2022-04-26 ENCOUNTER — Encounter (HOSPITAL_BASED_OUTPATIENT_CLINIC_OR_DEPARTMENT_OTHER): Payer: Self-pay

## 2022-04-26 DIAGNOSIS — K3189 Other diseases of stomach and duodenum: Secondary | ICD-10-CM | POA: Diagnosis not present

## 2022-04-26 DIAGNOSIS — C3410 Malignant neoplasm of upper lobe, unspecified bronchus or lung: Secondary | ICD-10-CM

## 2022-04-26 DIAGNOSIS — R918 Other nonspecific abnormal finding of lung field: Secondary | ICD-10-CM | POA: Insufficient documentation

## 2022-04-26 DIAGNOSIS — D509 Iron deficiency anemia, unspecified: Secondary | ICD-10-CM | POA: Insufficient documentation

## 2022-04-26 DIAGNOSIS — J9811 Atelectasis: Secondary | ICD-10-CM | POA: Diagnosis not present

## 2022-04-26 DIAGNOSIS — I700 Atherosclerosis of aorta: Secondary | ICD-10-CM | POA: Diagnosis not present

## 2022-04-26 DIAGNOSIS — Q6102 Congenital multiple renal cysts: Secondary | ICD-10-CM | POA: Insufficient documentation

## 2022-04-26 DIAGNOSIS — Z923 Personal history of irradiation: Secondary | ICD-10-CM | POA: Diagnosis not present

## 2022-04-26 DIAGNOSIS — J984 Other disorders of lung: Secondary | ICD-10-CM | POA: Diagnosis not present

## 2022-04-26 DIAGNOSIS — N281 Cyst of kidney, acquired: Secondary | ICD-10-CM | POA: Diagnosis not present

## 2022-04-26 DIAGNOSIS — J90 Pleural effusion, not elsewhere classified: Secondary | ICD-10-CM | POA: Insufficient documentation

## 2022-04-26 DIAGNOSIS — R911 Solitary pulmonary nodule: Secondary | ICD-10-CM | POA: Insufficient documentation

## 2022-04-26 MED ORDER — IOHEXOL 300 MG/ML  SOLN: 100.0000 mL | Freq: Once | INTRAMUSCULAR | Status: DC | PRN

## 2022-04-27 ENCOUNTER — Ambulatory Visit (INDEPENDENT_AMBULATORY_CARE_PROVIDER_SITE_OTHER): Payer: Medicare HMO | Admitting: Internal Medicine

## 2022-04-27 ENCOUNTER — Encounter: Payer: Self-pay | Admitting: Internal Medicine

## 2022-04-27 VITALS — BP 134/52 | HR 91 | Temp 97.7°F

## 2022-04-27 DIAGNOSIS — G8929 Other chronic pain: Secondary | ICD-10-CM

## 2022-04-27 DIAGNOSIS — J4489 Other specified chronic obstructive pulmonary disease: Secondary | ICD-10-CM

## 2022-04-27 DIAGNOSIS — C3410 Malignant neoplasm of upper lobe, unspecified bronchus or lung: Secondary | ICD-10-CM

## 2022-04-27 DIAGNOSIS — J439 Emphysema, unspecified: Secondary | ICD-10-CM

## 2022-04-27 DIAGNOSIS — M5440 Lumbago with sciatica, unspecified side: Secondary | ICD-10-CM

## 2022-04-27 DIAGNOSIS — R296 Repeated falls: Secondary | ICD-10-CM

## 2022-04-27 DIAGNOSIS — M542 Cervicalgia: Secondary | ICD-10-CM

## 2022-04-27 NOTE — Assessment & Plan Note (Signed)
[place] last 2 mo ago Using a w/c

## 2022-04-27 NOTE — Assessment & Plan Note (Signed)
Worse Will get an X ray

## 2022-04-27 NOTE — Assessment & Plan Note (Signed)
Chronic Continue w/Norco prn  Potential benefits of a long term opioids use as well as potential risks (i.e. addiction risk, apnea etc) and complications (i.e. Somnolence, constipation and others) were explained to the patient and were aknowledged. Narcane nasal prn Do not take Norco w/Xanax

## 2022-04-27 NOTE — Assessment & Plan Note (Signed)
Pt needs portable O2

## 2022-04-27 NOTE — Assessment & Plan Note (Signed)
Pt had a PET scan yesterday

## 2022-04-27 NOTE — Progress Notes (Signed)
Subjective:  Patient ID: Kaylee Hansen, female    DOB: 06-20-1956  Age: 66 y.o. MRN: [id number]  CC: Neck Injury (C/o of neck pain.. want to get xray)   HPI Kaylee Hansen presents for neck pain COPD, neck pain, lung cancer Pt needs portable O2  Outpatient Medications Prior to Visit  Medication Sig Dispense Refill   albuterol (PROVENTIL HFA;VENTOLIN HFA) 108 (90 Base) MCG/ACT inhaler Inhale 2 puffs into the lungs every 6 (six) hours as needed for wheezing or shortness of breath. 1 Inhaler 5   albuterol (PROVENTIL) (2.5 MG/3ML) 0.083% nebulizer solution INHALE 1 VIAL BY NEBULIZATION EVERY 4 HOURS AS NEEDED FOR WHEEZING OR SHORTNESS OF BREATH. 150 mL 5   ALPRAZolam (XANAX) 0.25 MG tablet Take 1 tablet (0.25 mg total) by mouth at bedtime as needed for anxiety. 90 tablet 2   aspirin EC 81 MG tablet Take 1 tablet (81 mg total) by mouth daily. 30 tablet 11   bisacodyl (DULCOLAX) 5 MG EC tablet Take 2 tablets (10 mg total) by mouth at bedtime. 30 tablet    Budeson-Glycopyrrol-Formoterol (BREZTRI AEROSPHERE) 160-9-4.8 MCG/ACT AERO Inhale 2 puffs into the lungs in the morning and at bedtime. 10.7 g 5   calcium carbonate (TUMS - DOSED IN MG ELEMENTAL CALCIUM) 500 MG chewable tablet Chew 1 tablet by mouth daily as needed for indigestion or heartburn.     Coenzyme Q10 (COQ10) 100 MG CAPS Take 100 mg by mouth in the morning.     diltiazem (CARDIZEM CD) 360 MG 24 hr capsule TAKE 1 CAPSULE BY MOUTH EVERY DAY 90 capsule 2   diphenhydrAMINE HCl, Sleep, (ZZZQUIL) 25 MG CAPS Take 50 mg by mouth at bedtime.     furosemide (LASIX) 20 MG tablet TAKE 1 TABLET BY MOUTH EVERY DAY 90 tablet 2   guaiFENesin (ROBITUSSIN) 100 MG/5ML liquid Take 200 mg by mouth 2 (two) times daily as needed for cough.     HYDROcodone-acetaminophen (NORCO) 10-325 MG tablet Take 1 tablet by mouth every 6 (six) hours as needed for severe pain. 120 tablet 0   hydroxypropyl methylcellulose / hypromellose (ISOPTO TEARS / GONIOVISC) 2.5 %  ophthalmic solution Place 1 drop into both eyes 3 (three) times daily as needed for dry eyes.     ipratropium-albuterol (DUONEB) 0.5-2.5 (3) MG/3ML SOLN Take 3 mLs by nebulization every 4 (four) hours as needed. 360 mL 0   isosorbide mononitrate (IMDUR) 120 MG 24 hr tablet TAKE 1 TABLET BY MOUTH EVERY DAY 90 tablet 1   ketoconazole (NIZORAL) 2 % cream APPLY TO AFFECTED AREA EVERY DAY 60 g 2   levothyroxine (SYNTHROID) 150 MCG tablet TAKE 1 TABLET BY MOUTH EVERY DAY 90 tablet 3   naloxegol oxalate (MOVANTIK) 12.5 MG TABS tablet Take 1 tablet (12.5 mg total) by mouth daily. 30 tablet 0   naproxen sodium (ALEVE) 220 MG tablet Take 220 mg by mouth daily as needed (For Headache).     nitroGLYCERIN (NITROSTAT) 0.4 MG SL tablet DISSOLVE ONE TABLET UNDER THE TONGUE EVERY 5 MINUTES AS NEEDED FOR CHEST PAIN.  DO NOT EXCEED A TOTAL OF 3 DOSES IN 15 MINUTES 25 tablet 6   OXYGEN Inhale 2.5 L/min into the lungs at bedtime.     pantoprazole (PROTONIX) 40 MG tablet TAKE 1 TABLET BY MOUTH DAILY BEFORE BREAKFAST 90 tablet 3   PARoxetine (PAXIL) 40 MG tablet TAKE 1 TABLET BY MOUTH EVERY DAY 90 tablet 3   phenytoin (DILANTIN) 100 MG ER capsule TAKE 100  MG EVERY MORNING AND 200 MG EVERY NIGHT. 270 capsule 3   promethazine (PHENERGAN) 25 MG tablet Take 1 tablet (25 mg total) by mouth every 8 (eight) hours as needed for nausea or vomiting. 30 tablet 0   Respiratory Therapy Supplies (FLUTTER) DEVI As directed 1 each 0   rosuvastatin (CRESTOR) 20 MG tablet TAKE 1 TABLET BY MOUTH EVERY DAY 90 tablet 0   SUMAtriptan (IMITREX) 100 MG tablet MAY REPEAT IN 2 HOURS IF HEADACHE PERSISTS OR RECURS. 9 tablet 3   topiramate (TOPAMAX) 50 MG tablet TAKE 1 TABLET (50 MG TOTAL) BY MOUTH 2 (TWO) TIMES DAILY AS NEEDED. TAKE AS NEEDED FOR HEADACHE 30 tablet 2   traZODone (DESYREL) 100 MG tablet TAKE 1 TABLET BY MOUTH EVERYDAY AT BEDTIME 90 tablet 1   HYDROcodone-acetaminophen (NORCO) 10-325 MG tablet Take 1 tablet by mouth every 8 (eight)  hours as needed for up to 5 days for severe pain. 120 tablet 0   Vitamin D, Ergocalciferol, (DRISDOL) 1.25 MG (50000 UNIT) CAPS capsule TAKE 1 CAPSULE (50,000 UNITS TOTAL) BY MOUTH EVERY MONDAY. (Patient not taking: Reported on 04/27/2022) 12 capsule 3   promethazine (PHENERGAN) injection 25 mg      No facility-administered medications prior to visit.    ROS: Review of Systems  Constitutional:  Positive for fatigue. Negative for activity change, appetite change, chills and unexpected weight change.  HENT:  Negative for congestion, mouth sores and sinus pressure.   Eyes:  Negative for visual disturbance.  Respiratory:  Positive for cough, shortness of breath and wheezing. Negative for choking, chest tightness and stridor.   Cardiovascular:  Negative for palpitations and leg swelling.  Gastrointestinal:  Negative for abdominal pain and nausea.  Genitourinary:  Negative for difficulty urinating, frequency and vaginal pain.  Musculoskeletal:  Negative for back pain and gait problem.  Skin:  Negative for pallor and rash.  Neurological:  Positive for weakness. Negative for dizziness, tremors, numbness and headaches.  Hematological:  Bruises/bleeds easily.  Psychiatric/Behavioral:  Positive for decreased concentration, dysphoric mood and sleep disturbance. Negative for confusion and suicidal ideas. The patient is nervous/anxious.     Objective:  BP (!) 134/52 (BP Location: Left Arm)   Pulse 91   Temp 97.7 F (36.5 C) (Oral)   SpO2 97%   BP Readings from Last 3 Encounters:  04/27/22 (!) 134/52  03/29/22 (!) 108/31  03/13/22 (!) 118/42    Wt Readings from Last 3 Encounters:  03/29/22 126 lb (57.2 kg)  03/13/22 128 lb 12.8 oz (58.4 kg)  02/10/22 130 lb (59 kg)    Physical Exam Constitutional:      General: She is not in acute distress.    Appearance: She is well-developed. She is ill-appearing. She is not toxic-appearing.  HENT:     Head: Normocephalic.     Right Ear: External  ear normal.     Left Ear: External ear normal.     Nose: Nose normal.  Eyes:     General:        Right eye: No discharge.        Left eye: No discharge.     Conjunctiva/sclera: Conjunctivae normal.     Pupils: Pupils are equal, round, and reactive to light.  Neck:     Thyroid: No thyromegaly.     Vascular: No JVD.     Trachea: No tracheal deviation.  Cardiovascular:     Rate and Rhythm: Normal rate and regular rhythm.     Heart sounds:  Normal heart sounds.  Pulmonary:     Effort: No respiratory distress.     Breath sounds: No stridor. Wheezing and rales present.  Abdominal:     General: Bowel sounds are normal. There is no distension.     Palpations: Abdomen is soft. There is no mass.     Tenderness: There is no abdominal tenderness. There is no guarding or rebound.  Musculoskeletal:        General: No tenderness.     Cervical back: Normal range of motion and neck supple. No rigidity.  Lymphadenopathy:     Cervical: No cervical adenopathy.  Skin:    Findings: No erythema or rash.  Neurological:     Cranial Nerves: No cranial nerve deficit.     Motor: Weakness present. No abnormal muscle tone.     Coordination: Coordination normal.     Gait: Gait abnormal.     Deep Tendon Reflexes: Reflexes normal.  Psychiatric:        Behavior: Behavior normal.        Thought Content: Thought content normal.        Judgment: Judgment normal.   Neck and LS spine w/pain In a w/c sleepy  Lab Results  Component Value Date   WBC 6.2 03/29/2022   HGB 10.3 (L) 03/29/2022   HCT 32.0 (L) 03/29/2022   PLT 249 03/29/2022   GLUCOSE 98 03/29/2022   CHOL 128 06/29/2018   TRIG 184 (H) 06/29/2018   HDL 35 (L) 06/29/2018   LDLDIRECT 187.0 07/14/2015   LDLCALC 56 06/29/2018   ALT 6 03/29/2022   AST 12 (L) 03/29/2022   NA 137 03/29/2022   K 4.0 03/29/2022   CL 102 03/29/2022   CREATININE 1.14 (H) 03/29/2022   BUN 12 03/29/2022   CO2 28 03/29/2022   TSH 1.84 01/01/2019   INR 0.9  09/22/2019   HGBA1C  09/17/2008    5.0 (NOTE)   The ADA recommends the following therapeutic goal for glycemic   control related to Hgb A1C measurement:   Goal of Therapy:   < 7.0% Hgb A1C   Reference: American Diabetes Association: Clinical Practice   Recommendations 2008, Diabetes Care,  2008, 31:(Suppl 1).    No results found.  Assessment & Plan:   Problem List Items Addressed This Visit     Cancer of lingula of lung ([place])    Pt had a PET scan yesterday      COPD with chronic bronchitis and emphysema ([place]) - Primary    Pt needs portable O2      Falls frequently    Fell last 2 mo ago Using a w/c      Low back pain    Chronic Continue w/Norco prn  Potential benefits of a long term opioids use as well as potential risks (i.e. addiction risk, apnea etc) and complications (i.e. Somnolence, constipation and others) were explained to the patient and were aknowledged. Narcane nasal prn Do not take Norco w/Xanax      Neck pain    Worse Will get an X ray      Relevant Orders   DG Cervical Spine Complete      No orders of the defined types were placed in this encounter.     Follow-up: No follow-ups on file.  Walker Kehr, MD

## 2022-05-01 ENCOUNTER — Encounter: Payer: Self-pay | Admitting: *Deleted

## 2022-05-12 ENCOUNTER — Inpatient Hospital Stay (HOSPITAL_BASED_OUTPATIENT_CLINIC_OR_DEPARTMENT_OTHER): Payer: Medicare HMO | Admitting: Hematology & Oncology

## 2022-05-12 ENCOUNTER — Inpatient Hospital Stay: Payer: Medicare HMO

## 2022-05-12 ENCOUNTER — Telehealth: Payer: Self-pay

## 2022-05-12 VITALS — BP 130/34 | HR 65 | Resp 15 | Ht 65.0 in

## 2022-05-12 DIAGNOSIS — C3490 Malignant neoplasm of unspecified part of unspecified bronchus or lung: Secondary | ICD-10-CM

## 2022-05-12 DIAGNOSIS — D509 Iron deficiency anemia, unspecified: Secondary | ICD-10-CM | POA: Diagnosis not present

## 2022-05-12 DIAGNOSIS — R911 Solitary pulmonary nodule: Secondary | ICD-10-CM | POA: Diagnosis not present

## 2022-05-12 NOTE — Telephone Encounter (Signed)
CSW contacted patient's daughter, Glenard Haring, per the request of RN.  She was unable to talk at the moment.  Plan is for CSW to contact her again next week.

## 2022-05-12 NOTE — Progress Notes (Signed)
Hematology and Oncology Follow Up Visit  Kaylee Hansen [id number] [birth year] 66 y.o. 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  The really bad news is that her husband died 10 days ago.  As he had a aortic aneurysm that burst.  I am just so sorry about this.  I know that he was incredibly dedicated to her.  She really relied on him.  He got her to where she needed to be.  I think his funeral is going to be tomorrow.  Again I just feel bad for her.  She is having some family members come over to stay with her.  However, it was her husband who really took care of her and took care of her medications.  She is on quite a few medications.  I am unsure what she is taking right now.  I do not know if she really understands what she is taking right now.  I do know that she does need oxygen at nighttime at least.  She is not smoking.  Her family will give her cigarettes.  She is quite upset over this.  We cannot get blood out of her.  She is really not eating.  She does need to have a Port-A-Cath placed.  I talked to her about this.  She agrees.  We will have to get 1 set up for her.  She has had no problems with diarrhea.  She has had some vomiting.  Her last iron studies that were done back in early August showed a ferritin of 53 with an iron saturation of 24%.  Currently, I would have to say that her performance status is probably ECOG 2.  Medications:  Allergies as of 05/12/2022       Reactions   Nitrofuran Derivatives Other (See Comments)   confusion   Oxycodone Hcl Other (See Comments)   Knocked  her out for 6 days   Morphine Other (See Comments)   "Makes me go crazy."    Penicillins Hives   Has patient had a PCN reaction causing immediate rash, facial/tongue/throat swelling, SOB or lightheadedness with hypotension: unknown Has patient had a PCN reaction causing severe rash involving mucus membranes or skin necrosis: unknown Has patient had a PCN reaction that required hospitalization No Has patient had a PCN reaction occurring within the last 10 years: No If all of the above answers are "NO", then may proceed with Cephalosporin use. Other reaction(s): Rash-Generalized   Tape Other (See Comments)   Skin turns red and burns        Medication List        Accurate as of May 12, 2022  2:38 PM. If you have any questions, ask your nurse or doctor.          albuterol 108 (90 Base) MCG/ACT inhaler Commonly known as: VENTOLIN HFA Inhale 2 puffs into the lungs every 6 (six) hours as needed for wheezing or shortness of breath.   albuterol (2.5 MG/3ML) 0.083% nebulizer solution Commonly known as: PROVENTIL INHALE 1 VIAL BY NEBULIZATION EVERY 4 HOURS AS NEEDED FOR  WHEEZING OR SHORTNESS OF BREATH.   ALPRAZolam 0.25 MG tablet Commonly known as: XANAX Take 1 tablet (0.25 mg total) by mouth at bedtime as needed for anxiety.   aspirin EC 81 MG tablet Take 1 tablet (81 mg total) by mouth daily.   bisacodyl 5 MG EC tablet Commonly known as: Dulcolax Take 2 tablets (10 mg total) by mouth at bedtime.   Breztri Aerosphere 160-9-4.8 MCG/ACT Aero Generic drug: Budeson-Glycopyrrol-Formoterol Inhale 2 puffs into the lungs in the morning and at bedtime.   calcium carbonate 500 MG chewable tablet Commonly known as: TUMS - dosed in mg elemental calcium Chew 1 tablet by mouth daily as needed for indigestion or heartburn.   CoQ10 100 MG Caps Take 100 mg by mouth in the morning.   diltiazem 360 MG 24 hr capsule Commonly known as: CARDIZEM CD TAKE 1 CAPSULE BY MOUTH EVERY DAY    [name] As directed   furosemide 20 MG tablet Commonly known as: LASIX TAKE 1 TABLET BY MOUTH EVERY DAY   guaiFENesin 100 MG/5ML liquid Commonly known as: ROBITUSSIN Take 200 mg by mouth 2 (two) times daily as needed for cough.   HYDROcodone-acetaminophen 10-325 MG tablet Commonly known as: NORCO Take 1 tablet by mouth every 6 (six) hours as needed for severe pain.   HYDROcodone-acetaminophen 10-325 MG tablet Commonly known as: NORCO Take 1 tablet by mouth every 8 (eight) hours as needed for up to 5 days for severe pain.   hydroxypropyl methylcellulose / hypromellose 2.5 % ophthalmic solution Commonly known as: ISOPTO TEARS / GONIOVISC Place 1 drop into both eyes 3 (three) times daily as needed for dry eyes.   ipratropium-albuterol 0.5-2.5 (3) MG/3ML Soln Commonly known as: DUONEB Take 3 mLs by nebulization every 4 (four) hours as needed.   isosorbide mononitrate 120 MG 24 hr tablet Commonly known as: IMDUR TAKE 1 TABLET BY MOUTH EVERY DAY   ketoconazole 2 % cream Commonly known as: NIZORAL APPLY TO AFFECTED AREA EVERY DAY   levothyroxine 150 MCG tablet Commonly known as: SYNTHROID TAKE 1 TABLET BY MOUTH EVERY DAY   naloxegol oxalate 12.5 MG Tabs tablet Commonly known as: MOVANTIK Take 1 tablet (12.5 mg total) by mouth daily.   naproxen sodium 220 MG tablet Commonly known as: ALEVE Take 220 mg by mouth daily as needed (For Headache).   nitroGLYCERIN 0.4 MG SL tablet Commonly known as: NITROSTAT DISSOLVE ONE TABLET UNDER THE TONGUE EVERY 5 MINUTES AS NEEDED FOR CHEST PAIN.  DO NOT EXCEED A TOTAL OF 3 DOSES IN 15 MINUTES   OXYGEN Inhale 2.5 L/min into the lungs at bedtime.   pantoprazole 40 MG tablet Commonly known as: PROTONIX TAKE 1 TABLET BY MOUTH DAILY BEFORE BREAKFAST   PARoxetine 40 MG tablet Commonly known as: PAXIL TAKE 1 TABLET BY MOUTH EVERY DAY   phenytoin 100 MG ER capsule Commonly known as: DILANTIN TAKE 100 MG EVERY MORNING AND 200 MG  EVERY NIGHT.   promethazine 25 MG tablet Commonly known as: PHENERGAN Take 1 tablet (25 mg total) by mouth every 8 (eight) hours as needed for nausea or vomiting.   rosuvastatin 20 MG tablet Commonly known as: CRESTOR TAKE 1 TABLET BY MOUTH EVERY DAY   SUMAtriptan 100 MG tablet Commonly known as: IMITREX MAY REPEAT IN 2 HOURS IF HEADACHE PERSISTS OR RECURS.   topiramate 50 MG tablet Commonly known as: TOPAMAX TAKE 1 TABLET (50 MG TOTAL) BY MOUTH 2 (TWO) TIMES DAILY AS NEEDED. TAKE AS NEEDED FOR HEADACHE  traZODone 100 MG tablet Commonly known as: DESYREL TAKE 1 TABLET BY MOUTH EVERYDAY AT BEDTIME   ZzzQuil 25 MG Caps Generic drug: diphenhydrAMINE HCl (Sleep) Take 50 mg by mouth at bedtime.        Allergies:  Allergies  Allergen Reactions   Nitrofuran Derivatives Other (See Comments)    confusion   Oxycodone Hcl Other (See Comments)    Knocked her out for 6 days   Morphine Other (See Comments)    "Makes me go crazy."    Penicillins Hives    Has patient had a PCN reaction causing immediate rash, facial/tongue/throat swelling, SOB or lightheadedness with hypotension: unknown Has patient had a PCN reaction causing severe rash involving mucus membranes or skin necrosis: unknown Has patient had a PCN reaction that required hospitalization No Has patient had a PCN reaction occurring within the last 10 years: No If all of the above answers are "NO", then may proceed with Cephalosporin use.  Other reaction(s): Rash-Generalized   Tape Other (See Comments)    Skin turns red and burns    Past Medical History, Surgical history, Social history, and Family History were reviewed and updated.  Review of Systems: Review of Systems  Constitutional: Negative.   HENT: Negative.    Eyes: Negative.   Respiratory:  Positive for cough, sputum production, shortness of breath and wheezing.   Cardiovascular:  Positive for palpitations and leg swelling.  Gastrointestinal:  Positive  for diarrhea and nausea.  Genitourinary:  Positive for frequency and urgency.  Musculoskeletal:  Positive for joint pain and myalgias.  Skin: Negative.   Neurological:  Positive for weakness.  Endo/Heme/Allergies: Negative.   Psychiatric/Behavioral: Negative.       Physical Exam:  blood pressure is 130/34 (abnormal) and her pulse is 65. Her respiration is 15.   Wt Readings from Last 3 Encounters:  03/29/22 126 lb (57.2 kg)  03/13/22 128 lb 12.8 oz (58.4 kg)  02/10/22 130 lb (59 kg)    Physical Exam Vitals reviewed.  HENT:     Head: Normocephalic and atraumatic.  Eyes:     Pupils: Pupils are equal, round, and reactive to light.  Cardiovascular:     Rate and Rhythm: Normal rate and regular rhythm.     Heart sounds: Normal heart sounds.  Pulmonary:     Effort: Pulmonary effort is normal.     Breath sounds: Normal breath sounds.     Comments: Pulmonary exam shows wheezes or rhonchi bilaterally.  She has relatively decent air movement. Abdominal:     General: Bowel sounds are normal.     Palpations: Abdomen is soft.  Musculoskeletal:        General: No tenderness or deformity. Normal range of motion.     Cervical back: Normal range of motion.     Comments: Extremities does show some mild edema in the legs bilaterally.  Lymphadenopathy:     Cervical: No cervical adenopathy.  Skin:    General: Skin is warm and dry.     Findings: No erythema or rash.  Neurological:     Mental Status: She is alert and oriented to person, place, and time.  Psychiatric:        Behavior: Behavior normal.        Thought Content: Thought content normal.        Judgment: Judgment normal.      Lab Results  Component Value Date   WBC 6.2 03/29/2022   HGB 10.3 (L) 03/29/2022   HCT 32.0 (  L) 03/29/2022   MCV 97.9 03/29/2022   PLT 249 03/29/2022   Lab Results  Component Value Date   FERRITIN 53 03/29/2022   IRON 50 03/29/2022   TIBC 207 (L) 03/29/2022   UIBC 157 03/29/2022   IRONPCTSAT  24 03/29/2022   Lab Results  Component Value Date   RETICCTPCT 1.4 03/29/2022   RBC 3.27 (L) 03/29/2022   RBC 3.30 (L) 03/29/2022   No results found for: "KPAFRELGTCHN", "LAMBDASER", "KAPLAMBRATIO" No results found for: "IGGSERUM", "IGA", "IGMSERUM" No results found for: "TOTALPROTELP", "ALBUMINELP", "A1GS", "A2GS", "BETS", "BETA2SER", "GAMS", "MSPIKE", "SPEI"   Chemistry      Component Value Date/Time   NA 137 03/29/2022 1324   NA 144 07/08/2018 1059   K 4.0 03/29/2022 1324   CL 102 03/29/2022 1324   CO2 28 03/29/2022 1324   BUN 12 03/29/2022 1324   BUN 12 07/08/2018 1059   CREATININE 1.14 (H) 03/29/2022 1324      Component Value Date/Time   CALCIUM 9.6 03/29/2022 1324   ALKPHOS 140 (H) 03/29/2022 1324   AST 12 (L) 03/29/2022 1324   ALT 6 03/29/2022 1324   BILITOT 0.4 03/29/2022 1324       Impression and Plan: Ms. Deshpande is a very pleasant 66 yo caucasian female with clinical bronchogenic carcinoma and iron deficiency anemia.   She completed SBRT in May 2021.   She completed external beam radiation therapy to the right lung.  She completed this back on 03/02/2022.  She was supposed to have a PET scan done.  She has not had this done yet.  I am unsure as to why this has not been done.  Hopefully, we can see if this can be scheduled.  I will plan to get her back to see [place] in another month or so.  Again we will get a Port-A-Cath put into her.  That way, we can get some labs that we need.  I am praying hard for her.  I just feel bad that her poor husband passed away so suddenly.    [name], MD 11/17/20232:38 PM

## 2022-05-15 ENCOUNTER — Inpatient Hospital Stay: Payer: Medicare HMO

## 2022-05-15 NOTE — Progress Notes (Signed)
[facility] Work  Initial Assessment   [place] is a 66 y.o. year old female contacted caregiver by phone. Clinical Social Work was referred by nurse for assessment of psychosocial needs.   SDOH (Social Determinants of Health) assessments performed: Yes SDOH Interventions    Flowsheet Row Clinical Support from 05/15/2022 in [facility] Clinical Support from 02/13/2022 in [facility] at [facility] from 01/31/2022 in [facility] from 11/23/2021 in [facility] at [place] [place] [facility] from 07/01/2021 in [facility] at [facility] from 01/24/2021 in [facility] at [facility] Interventions -- -- Intervention Not Indicated Intervention Not Indicated  [continues to deny food insecurity] Intervention Not Indicated  [Denies food insecurity] Intervention Not Indicated  Housing Interventions -- Intervention Not Indicated -- Intervention Not Indicated  [continues residing with spouse,  continues to deny housing concerns] -- Intervention Not Indicated  Transportation Interventions -- -- Intervention Not Indicated  [patient's husband continues to provide transportation] Intervention Not Indicated  [husband continues to provide all transportation] Intervention Not Indicated  [Reports husband continues to provide transportation] Intervention Not Indicated  [Husband provides transportation]  Utilities Interventions Intervention Not Indicated -- -- -- -- --  Depression Interventions/Treatment  -- Medication -- -- -- --  Financial Strain Interventions -- Intervention Not Indicated -- -- -- --  Physical Activity Interventions -- Intervention Not Indicated -- -- -- --  Stress Interventions -- Intervention Not Indicated -- -- -- --  Social Connections Interventions -- Intervention Not Indicated --  -- -- --       SDOH Screenings   Food Insecurity: No Food Insecurity (01/31/2022)  Housing: Low Risk  (02/13/2022)  Transportation Needs: No Transportation Needs (01/31/2022)  Utilities: Not At Risk (05/15/2022)  Alcohol Screen: Low Risk  (02/13/2022)  Depression (PHQ2-9): Medium Risk (02/13/2022)  Financial Resource Strain: Low Risk  (02/13/2022)  Physical Activity: Inactive (02/13/2022)  Social Connections: Moderately Isolated (02/13/2022)  Stress: No Stress Concern Present (02/13/2022)  Tobacco Use: High Risk (04/27/2022)     Distress Screen completed: No    10/26/2021   11:00 AM  ONCBCN DISTRESS SCREENING  Screening Type Initial Screening  Distress experienced in past week (1-10) 8  Practical problem type Housing;Insurance;Childcare  Emotional problem type Depression;Nervousness/Anxiety;Adjusting to illness;Isolation/feeling alone;Feeling hopeless;Boredom;Adjusting to appearance changes  Spiritual/Religous concerns type Relating to God;Facing my mortality;Loss of sense of purpose  Information Concerns Type Lack of info about maintaining fitness  Physical Problem type Pain;Nausea/vomiting;Sleep/insomnia;Getting around;Bathing/dressing;Breathing;Loss of appetitie      Family/Social Information:  Housing Arrangement: patient lives with her son. Family members/support persons in your life? Family Transportation concerns: no  Employment: Retired  Income source: Paediatric nurse concerns: No Type of concern: None Food access concerns: no Religious or spiritual practice: Not known Services Currently in place:  [employer]  Coping/ Adjustment to diagnosis: Patient understands treatment plan and what happens next? Patient's husband died recently and was the sole caregiver of patient.  Her daughter, Glenard Haring, is currently assisting with patient's pill box.  Patient has stated she wishes to go to a nursing home. Concerns about diagnosis and/or treatment: How will I  care for myself Patient reported stressors: Adjusting to my illness Hopes and/or priorities: Priority for daughter is for patient to be taken care of. Patient enjoys time with family/ friends Current coping skills/ strengths:  Financial means , General fund of knowledge , and Supportive family/friends     SUMMARY: Current SDOH Barriers:  Level of care concerns  Clinical Social Work Clinical Goal(s):  Explore community resources related to placement.  Interventions: Discussed common feeling and emotions when being diagnosed with cancer, and the importance of support during treatment Informed patient of the support team roles and support services at [facility] Provided CSW contact information and encouraged patient to call with any questions or concerns Provided patient with information about the [facility].local nursing homes and what to consider since patient's husband has died.   Follow Up Plan: CSW will follow-up with patient by phone  Patient verbalizes understanding of plan: Yes    Rodman Pickle Riyan Haile, LCSW

## 2022-05-17 ENCOUNTER — Telehealth: Payer: Self-pay | Admitting: Internal Medicine

## 2022-05-17 DIAGNOSIS — R918 Other nonspecific abnormal finding of lung field: Secondary | ICD-10-CM | POA: Diagnosis not present

## 2022-05-17 DIAGNOSIS — I959 Hypotension, unspecified: Secondary | ICD-10-CM | POA: Diagnosis not present

## 2022-05-17 DIAGNOSIS — S9031XA Contusion of right foot, initial encounter: Secondary | ICD-10-CM | POA: Diagnosis not present

## 2022-05-17 DIAGNOSIS — R531 Weakness: Secondary | ICD-10-CM | POA: Diagnosis not present

## 2022-05-17 DIAGNOSIS — I10 Essential (primary) hypertension: Secondary | ICD-10-CM | POA: Diagnosis not present

## 2022-05-17 DIAGNOSIS — R52 Pain, unspecified: Secondary | ICD-10-CM | POA: Diagnosis not present

## 2022-05-17 DIAGNOSIS — G4489 Other headache syndrome: Secondary | ICD-10-CM | POA: Diagnosis not present

## 2022-05-17 DIAGNOSIS — I517 Cardiomegaly: Secondary | ICD-10-CM | POA: Diagnosis not present

## 2022-05-17 DIAGNOSIS — M79671 Pain in right foot: Secondary | ICD-10-CM | POA: Diagnosis not present

## 2022-05-17 DIAGNOSIS — J90 Pleural effusion, not elsewhere classified: Secondary | ICD-10-CM | POA: Diagnosis not present

## 2022-05-17 DIAGNOSIS — R296 Repeated falls: Secondary | ICD-10-CM | POA: Diagnosis not present

## 2022-05-17 DIAGNOSIS — I639 Cerebral infarction, unspecified: Secondary | ICD-10-CM | POA: Diagnosis not present

## 2022-05-17 DIAGNOSIS — R079 Chest pain, unspecified: Secondary | ICD-10-CM | POA: Diagnosis not present

## 2022-05-17 DIAGNOSIS — R609 Edema, unspecified: Secondary | ICD-10-CM | POA: Diagnosis not present

## 2022-05-17 DIAGNOSIS — T68XXXA Hypothermia, initial encounter: Secondary | ICD-10-CM | POA: Diagnosis not present

## 2022-05-17 NOTE — Telephone Encounter (Signed)
Patients daughter Glenard Haring called and said patient isnt able to take care of herself and that her brother is trying to do what he can, she said she has fell a couple of times this week. They are trying to get her into a facility but they need a FL2 form filled out. She needs a place that will need to do a private pay location for now until she can just use medicaid. Call back is [phone]

## 2022-05-18 DIAGNOSIS — N390 Urinary tract infection, site not specified: Secondary | ICD-10-CM | POA: Diagnosis not present

## 2022-05-18 DIAGNOSIS — C7951 Secondary malignant neoplasm of bone: Secondary | ICD-10-CM | POA: Diagnosis not present

## 2022-05-18 DIAGNOSIS — S2243XA Multiple fractures of ribs, bilateral, initial encounter for closed fracture: Secondary | ICD-10-CM | POA: Diagnosis not present

## 2022-05-18 DIAGNOSIS — I351 Nonrheumatic aortic (valve) insufficiency: Secondary | ICD-10-CM | POA: Diagnosis not present

## 2022-05-19 DIAGNOSIS — C3490 Malignant neoplasm of unspecified part of unspecified bronchus or lung: Secondary | ICD-10-CM | POA: Diagnosis not present

## 2022-05-19 DIAGNOSIS — N390 Urinary tract infection, site not specified: Secondary | ICD-10-CM | POA: Diagnosis not present

## 2022-05-19 DIAGNOSIS — S2243XA Multiple fractures of ribs, bilateral, initial encounter for closed fracture: Secondary | ICD-10-CM | POA: Diagnosis not present

## 2022-05-19 DIAGNOSIS — C7951 Secondary malignant neoplasm of bone: Secondary | ICD-10-CM | POA: Diagnosis not present

## 2022-05-20 DIAGNOSIS — E039 Hypothyroidism, unspecified: Secondary | ICD-10-CM | POA: Diagnosis not present

## 2022-05-20 DIAGNOSIS — D649 Anemia, unspecified: Secondary | ICD-10-CM | POA: Diagnosis not present

## 2022-05-20 DIAGNOSIS — R531 Weakness: Secondary | ICD-10-CM | POA: Diagnosis not present

## 2022-05-20 DIAGNOSIS — F419 Anxiety disorder, unspecified: Secondary | ICD-10-CM | POA: Diagnosis not present

## 2022-05-20 DIAGNOSIS — J449 Chronic obstructive pulmonary disease, unspecified: Secondary | ICD-10-CM | POA: Diagnosis not present

## 2022-05-20 DIAGNOSIS — C7951 Secondary malignant neoplasm of bone: Secondary | ICD-10-CM | POA: Diagnosis not present

## 2022-05-20 DIAGNOSIS — S9031XA Contusion of right foot, initial encounter: Secondary | ICD-10-CM | POA: Diagnosis not present

## 2022-05-20 DIAGNOSIS — S2243XA Multiple fractures of ribs, bilateral, initial encounter for closed fracture: Secondary | ICD-10-CM | POA: Diagnosis not present

## 2022-05-20 DIAGNOSIS — I509 Heart failure, unspecified: Secondary | ICD-10-CM | POA: Diagnosis not present

## 2022-05-20 DIAGNOSIS — E7800 Pure hypercholesterolemia, unspecified: Secondary | ICD-10-CM | POA: Diagnosis not present

## 2022-05-20 DIAGNOSIS — M1990 Unspecified osteoarthritis, unspecified site: Secondary | ICD-10-CM | POA: Diagnosis not present

## 2022-05-20 DIAGNOSIS — N390 Urinary tract infection, site not specified: Secondary | ICD-10-CM | POA: Diagnosis not present

## 2022-05-20 DIAGNOSIS — J9611 Chronic respiratory failure with hypoxia: Secondary | ICD-10-CM | POA: Diagnosis not present

## 2022-05-20 DIAGNOSIS — I252 Old myocardial infarction: Secondary | ICD-10-CM | POA: Diagnosis not present

## 2022-05-20 DIAGNOSIS — I110 Hypertensive heart disease with heart failure: Secondary | ICD-10-CM | POA: Diagnosis not present

## 2022-05-20 DIAGNOSIS — R296 Repeated falls: Secondary | ICD-10-CM | POA: Diagnosis not present

## 2022-05-20 DIAGNOSIS — W19XXXA Unspecified fall, initial encounter: Secondary | ICD-10-CM | POA: Diagnosis not present

## 2022-05-20 DIAGNOSIS — F17210 Nicotine dependence, cigarettes, uncomplicated: Secondary | ICD-10-CM | POA: Diagnosis not present

## 2022-05-20 DIAGNOSIS — Z85118 Personal history of other malignant neoplasm of bronchus and lung: Secondary | ICD-10-CM | POA: Diagnosis not present

## 2022-05-20 DIAGNOSIS — B961 Klebsiella pneumoniae [K. pneumoniae] as the cause of diseases classified elsewhere: Secondary | ICD-10-CM | POA: Diagnosis not present

## 2022-05-20 DIAGNOSIS — Z9981 Dependence on supplemental oxygen: Secondary | ICD-10-CM | POA: Diagnosis not present

## 2022-05-20 DIAGNOSIS — Z8701 Personal history of pneumonia (recurrent): Secondary | ICD-10-CM | POA: Diagnosis not present

## 2022-05-20 DIAGNOSIS — F32A Depression, unspecified: Secondary | ICD-10-CM | POA: Diagnosis not present

## 2022-05-20 DIAGNOSIS — E871 Hypo-osmolality and hyponatremia: Secondary | ICD-10-CM | POA: Diagnosis not present

## 2022-05-20 DIAGNOSIS — R5381 Other malaise: Secondary | ICD-10-CM | POA: Diagnosis not present

## 2022-05-22 ENCOUNTER — Inpatient Hospital Stay: Payer: Medicare HMO

## 2022-05-22 NOTE — Telephone Encounter (Signed)
I'm sorry Kaylee Hansen is not well. I agree w/plan. Thx

## 2022-05-22 NOTE — Progress Notes (Signed)
[facility] CSW Progress Note  Clinical Education officer, museum contacted caregiver by phone to assess needs.  Spoke with her daughter, Glenard Haring.  She stated patient had several falls last week and was hospitalized.  While there, it was determined that she was eligible for [facility].  She is scheduled to be admitted there today.  Informed Dr. Antonieta Pert office per daughter's request.  Glenard Haring was able to complete some of the tasks she was working on regarding patient care.  She expressed no other needs.    Rodman Pickle Saaya Procell, LCSW

## 2022-05-22 NOTE — Telephone Encounter (Signed)
Called daughter Levada Dy she everything has change from last week. She states they are actually at the hospital. Mom is being admitted to [facility] today after D/C from hosp they will be sending her to [facility] w/Hospice. Inform daughter will let MD know.Marland KitchenJohny Chess

## 2022-05-31 ENCOUNTER — Other Ambulatory Visit: Payer: Self-pay | Admitting: Cardiology

## 2022-06-12 ENCOUNTER — Ambulatory Visit: Payer: Medicare HMO | Admitting: Internal Medicine

## 2022-06-15 ENCOUNTER — Other Ambulatory Visit: Payer: Self-pay | Admitting: Cardiology

## 2022-06-15 ENCOUNTER — Other Ambulatory Visit: Payer: Self-pay | Admitting: Hematology & Oncology

## 2022-06-16 ENCOUNTER — Other Ambulatory Visit: Payer: Medicare HMO

## 2022-06-16 ENCOUNTER — Ambulatory Visit: Payer: Medicare HMO | Admitting: Hematology & Oncology

## 2022-06-18 DIAGNOSIS — C3490 Malignant neoplasm of unspecified part of unspecified bronchus or lung: Secondary | ICD-10-CM | POA: Diagnosis not present

## 2022-06-26 DEATH — deceased

## 2022-06-30 ENCOUNTER — Other Ambulatory Visit: Payer: Self-pay | Admitting: Cardiology

## 2022-07-27 ENCOUNTER — Other Ambulatory Visit: Payer: Self-pay | Admitting: Internal Medicine

## 2022-09-25 ENCOUNTER — Encounter: Payer: Self-pay | Admitting: Family
